# Patient Record
Sex: Male | Born: 1965 | Race: Black or African American | Hispanic: No | Marital: Single | Smoking: Former smoker
Health system: Southern US, Community
[De-identification: ages and names within clinical notes are randomized; demographics above are authoritative.]

## PROBLEM LIST (undated history)

## (undated) DIAGNOSIS — M1990 Unspecified osteoarthritis, unspecified site: Secondary | ICD-10-CM

## (undated) DIAGNOSIS — Z87442 Personal history of urinary calculi: Secondary | ICD-10-CM

## (undated) DIAGNOSIS — I10 Essential (primary) hypertension: Secondary | ICD-10-CM

## (undated) DIAGNOSIS — R51 Headache: Secondary | ICD-10-CM

## (undated) DIAGNOSIS — I499 Cardiac arrhythmia, unspecified: Secondary | ICD-10-CM

## (undated) DIAGNOSIS — N179 Acute kidney failure, unspecified: Secondary | ICD-10-CM

## (undated) DIAGNOSIS — R079 Chest pain, unspecified: Secondary | ICD-10-CM

## (undated) DIAGNOSIS — I509 Heart failure, unspecified: Secondary | ICD-10-CM

## (undated) DIAGNOSIS — R519 Headache, unspecified: Secondary | ICD-10-CM

## (undated) DIAGNOSIS — Z95810 Presence of automatic (implantable) cardiac defibrillator: Secondary | ICD-10-CM

## (undated) DIAGNOSIS — J101 Influenza due to other identified influenza virus with other respiratory manifestations: Secondary | ICD-10-CM

## (undated) DIAGNOSIS — J189 Pneumonia, unspecified organism: Secondary | ICD-10-CM

## (undated) DIAGNOSIS — I469 Cardiac arrest, cause unspecified: Secondary | ICD-10-CM

## (undated) DIAGNOSIS — I428 Other cardiomyopathies: Secondary | ICD-10-CM

## (undated) DIAGNOSIS — G459 Transient cerebral ischemic attack, unspecified: Secondary | ICD-10-CM

## (undated) DIAGNOSIS — E7800 Pure hypercholesterolemia, unspecified: Secondary | ICD-10-CM

## (undated) DIAGNOSIS — M109 Gout, unspecified: Secondary | ICD-10-CM

## (undated) HISTORY — DX: Chest pain, unspecified: R07.9

## (undated) HISTORY — PX: VASECTOMY: SHX75

## (undated) HISTORY — DX: Acute kidney failure, unspecified: N17.9

## (undated) HISTORY — DX: Pneumonia, unspecified organism: J18.9

## (undated) HISTORY — PX: EXTERNAL FIXATION LEG: SHX1549

## (undated) HISTORY — PX: UMBILICAL HERNIA REPAIR: SHX196

## (undated) HISTORY — PX: HERNIA REPAIR: SHX51

## (undated) HISTORY — DX: Influenza due to other identified influenza virus with other respiratory manifestations: J10.1

## (undated) HISTORY — DX: Transient cerebral ischemic attack, unspecified: G45.9

---

## 2000-06-28 ENCOUNTER — Ambulatory Visit (HOSPITAL_BASED_OUTPATIENT_CLINIC_OR_DEPARTMENT_OTHER): Admission: RE | Admit: 2000-06-28 | Discharge: 2000-06-29 | Payer: Self-pay | Admitting: Orthopedic Surgery

## 2001-03-12 ENCOUNTER — Ambulatory Visit (HOSPITAL_BASED_OUTPATIENT_CLINIC_OR_DEPARTMENT_OTHER): Admission: RE | Admit: 2001-03-12 | Discharge: 2001-03-12 | Payer: Self-pay | Admitting: Orthopedic Surgery

## 2001-08-08 ENCOUNTER — Ambulatory Visit (HOSPITAL_BASED_OUTPATIENT_CLINIC_OR_DEPARTMENT_OTHER): Admission: RE | Admit: 2001-08-08 | Discharge: 2001-08-09 | Payer: Self-pay | Admitting: Orthopedic Surgery

## 2007-09-08 ENCOUNTER — Emergency Department: Payer: Self-pay | Admitting: Emergency Medicine

## 2008-01-30 ENCOUNTER — Emergency Department: Payer: Self-pay | Admitting: Emergency Medicine

## 2008-01-30 ENCOUNTER — Other Ambulatory Visit: Payer: Self-pay

## 2008-02-27 ENCOUNTER — Ambulatory Visit: Payer: Self-pay | Admitting: Family Medicine

## 2008-03-11 ENCOUNTER — Ambulatory Visit: Payer: Self-pay | Admitting: Gastroenterology

## 2008-06-21 ENCOUNTER — Emergency Department: Payer: Self-pay | Admitting: Internal Medicine

## 2008-09-05 ENCOUNTER — Other Ambulatory Visit: Payer: Self-pay

## 2008-09-05 ENCOUNTER — Emergency Department: Payer: Self-pay | Admitting: Emergency Medicine

## 2009-04-11 ENCOUNTER — Ambulatory Visit: Payer: Self-pay | Admitting: Internal Medicine

## 2009-04-12 ENCOUNTER — Emergency Department: Payer: Self-pay | Admitting: Emergency Medicine

## 2009-08-04 ENCOUNTER — Ambulatory Visit: Payer: Self-pay | Admitting: Specialist

## 2009-08-12 ENCOUNTER — Ambulatory Visit: Payer: Self-pay | Admitting: Specialist

## 2009-08-29 ENCOUNTER — Ambulatory Visit: Payer: Self-pay | Admitting: Specialist

## 2009-09-09 ENCOUNTER — Ambulatory Visit: Payer: Self-pay | Admitting: Specialist

## 2015-11-19 ENCOUNTER — Emergency Department: Payer: Managed Care, Other (non HMO)

## 2015-11-19 ENCOUNTER — Encounter: Payer: Self-pay | Admitting: Emergency Medicine

## 2015-11-19 ENCOUNTER — Emergency Department
Admission: EM | Admit: 2015-11-19 | Discharge: 2015-11-19 | Disposition: A | Discharge status: Home / Self Care | Payer: Managed Care, Other (non HMO) | Attending: Emergency Medicine | Admitting: Emergency Medicine

## 2015-11-19 DIAGNOSIS — R0789 Other chest pain: Secondary | ICD-10-CM | POA: Insufficient documentation

## 2015-11-19 DIAGNOSIS — I10 Essential (primary) hypertension: Secondary | ICD-10-CM | POA: Diagnosis present

## 2015-11-19 DIAGNOSIS — I509 Heart failure, unspecified: Secondary | ICD-10-CM | POA: Insufficient documentation

## 2015-11-19 DIAGNOSIS — I160 Hypertensive urgency: Secondary | ICD-10-CM | POA: Insufficient documentation

## 2015-11-19 DIAGNOSIS — R0781 Pleurodynia: Secondary | ICD-10-CM

## 2015-11-19 DIAGNOSIS — F17200 Nicotine dependence, unspecified, uncomplicated: Secondary | ICD-10-CM | POA: Diagnosis not present

## 2015-11-19 HISTORY — DX: Essential (primary) hypertension: I10

## 2015-11-19 LAB — CBC WITH DIFFERENTIAL/PLATELET
BASOS PCT: 1 %
Basophils Absolute: 0.1 10*3/uL (ref 0–0.1)
EOS ABS: 0.2 10*3/uL (ref 0–0.7)
EOS PCT: 3 %
HEMATOCRIT: 50.6 % (ref 40.0–52.0)
Hemoglobin: 16.3 g/dL (ref 13.0–18.0)
Lymphocytes Relative: 26 %
Lymphs Abs: 2.5 10*3/uL (ref 1.0–3.6)
MCH: 28.1 pg (ref 26.0–34.0)
MCHC: 32.2 g/dL (ref 32.0–36.0)
MCV: 87.3 fL (ref 80.0–100.0)
MONO ABS: 0.9 10*3/uL (ref 0.2–1.0)
MONOS PCT: 9 %
Neutro Abs: 5.9 10*3/uL (ref 1.4–6.5)
Neutrophils Relative %: 61 %
PLATELETS: 215 10*3/uL (ref 150–440)
RBC: 5.8 MIL/uL (ref 4.40–5.90)
RDW: 15 % — AB (ref 11.5–14.5)
WBC: 9.6 10*3/uL (ref 3.8–10.6)

## 2015-11-19 LAB — TROPONIN I
TROPONIN I: 0.05 ng/mL — AB (ref <0.031)
Troponin I: 0.04 ng/mL — ABNORMAL HIGH (ref <0.031)

## 2015-11-19 LAB — COMPREHENSIVE METABOLIC PANEL
ALBUMIN: 3.9 g/dL (ref 3.5–5.0)
ALT: 21 U/L (ref 17–63)
ANION GAP: 6 (ref 5–15)
AST: 21 U/L (ref 15–41)
Alkaline Phosphatase: 60 U/L (ref 38–126)
BILIRUBIN TOTAL: 0.8 mg/dL (ref 0.3–1.2)
BUN: 19 mg/dL (ref 6–20)
CO2: 25 mmol/L (ref 22–32)
Calcium: 9.6 mg/dL (ref 8.9–10.3)
Chloride: 109 mmol/L (ref 101–111)
Creatinine, Ser: 1.06 mg/dL (ref 0.61–1.24)
GFR calc Af Amer: >60 mL/min (ref >60)
GFR calc non Af Amer: >60 mL/min (ref >60)
GLUCOSE: 113 mg/dL — AB (ref 65–99)
POTASSIUM: 3.8 mmol/L (ref 3.5–5.1)
SODIUM: 140 mmol/L (ref 135–145)
TOTAL PROTEIN: 7.1 g/dL (ref 6.5–8.1)

## 2015-11-19 LAB — BRAIN NATRIURETIC PEPTIDE: B NATRIURETIC PEPTIDE 5: 425 pg/mL — AB (ref 0.0–100.0)

## 2015-11-19 LAB — LIPASE, BLOOD: Lipase: 28 U/L (ref 11–51)

## 2015-11-19 MED ORDER — FUROSEMIDE 10 MG/ML IJ SOLN
20.0000 mg | Freq: Once | INTRAMUSCULAR | Status: AC
Start: 2015-11-19 — End: 2015-11-19
  Administered 2015-11-19: 20 mg via INTRAVENOUS
  Filled 2015-11-19: qty 4

## 2015-11-19 MED ORDER — LABETALOL HCL 5 MG/ML IV SOLN
10.0000 mg | Freq: Once | INTRAVENOUS | Status: AC
  Administered 2015-11-19: 10 mg via INTRAVENOUS
  Filled 2015-11-19: qty 4

## 2015-11-19 MED ORDER — LISINOPRIL-HYDROCHLOROTHIAZIDE 10-12.5 MG PO TABS: 1.0000 | ORAL_TABLET | Freq: Every day | ORAL | Status: DC

## 2015-11-19 MED ORDER — LABETALOL HCL 5 MG/ML IV SOLN
INTRAVENOUS | Status: AC
  Administered 2015-11-19: 20 mg via INTRAVENOUS
  Filled 2015-11-19: qty 4

## 2015-11-19 MED ORDER — FUROSEMIDE 20 MG PO TABS: 20.0000 mg | ORAL_TABLET | Freq: Every day | ORAL | Status: DC

## 2015-11-19 MED ORDER — HYDROCHLOROTHIAZIDE 12.5 MG PO CAPS
12.5000 mg | ORAL_CAPSULE | Freq: Every day | ORAL | Status: DC
  Administered 2015-11-19: 12.5 mg via ORAL
  Filled 2015-11-19: qty 1

## 2015-11-19 MED ORDER — LISINOPRIL 20 MG PO TABS
10.0000 mg | ORAL_TABLET | Freq: Once | ORAL | Status: AC
  Administered 2015-11-19: 10 mg via ORAL
  Filled 2015-11-19: qty 1

## 2015-11-19 MED ORDER — LABETALOL HCL 5 MG/ML IV SOLN
20.0000 mg | Freq: Once | INTRAVENOUS | Status: AC
  Administered 2015-11-19: 20 mg via INTRAVENOUS

## 2015-11-19 MED ORDER — IOHEXOL 350 MG/ML SOLN
75.0000 mL | Freq: Once | INTRAVENOUS | Status: AC | PRN
  Administered 2015-11-19: 75 mL via INTRAVENOUS

## 2015-11-19 MED ORDER — ASPIRIN 81 MG PO CHEW
324.0000 mg | CHEWABLE_TABLET | Freq: Once | ORAL | Status: AC
  Administered 2015-11-19: 324 mg via ORAL
  Filled 2015-11-19: qty 4

## 2015-11-19 NOTE — ED Notes (Signed)
Discussed discharge instructions, prescriptions, and follow-up care with patient. No questions or concerns at this time. Pt stable at discharge.  

## 2015-11-19 NOTE — Discharge Instructions (Signed)
Please follow [name] on Monday. This is extremely important, please let the clinic know you were seen in the ER and [name] wishes to see you this Monday in follow-up in his office.  Return to emergency room right away if you have increasing shortness of breath, return of chest pain, feel weak or dizzy, or if any other new concerns or symptoms arise.

## 2015-11-19 NOTE — ED Notes (Signed)
Pt to ed from [facility].  Pt states he has been feeling sob and having upper abd pain.

## 2015-11-19 NOTE — ED Provider Notes (Signed)
[facility] Emergency Department Provider Note REMINDER - THIS NOTE IS NOT A FINAL MEDICAL RECORD UNTIL IT IS SIGNED. UNTIL THEN, THE CONTENT BELOW MAY REFLECT INFORMATION FROM A DOCUMENTATION TEMPLATE, NOT THE ACTUAL PATIENT VISIT. ____________________________________________  Time seen: Approximately 2:11 PM  I have reviewed the triage vital signs and the nursing notes.   HISTORY  Chief Complaint Hypertension    HPI Stephen [name] is a 49 y.o. male history of hypertension.  The patient reports he comes for evaluation of feeling short of breathand having frequent cough over the last month. He reports he was seen in urgent care a month ago for the same but was having "pleurisy" with rib pain along the right ribs at that time. He denies any chest pain or discomfort now but reports a persistent nonproductive dry cough for the last month. No fevers or chills. No chest pain presently that he did have right-sided chest pain which resolved a couple of weeks ago over the right lower ribs.  He does report that he is supposed to be taking blood pressure medication but is not [place] for quite some time, in addition he is a smoker.  He does have a history of travel frequent traveling back and forth from [place] for work. He denies any leg swelling, his last trip was approximate 6 months ago.     Past Medical History  Diagnosis Date  . Hypertension     There are no active problems to display for this patient.   History reviewed. No pertinent past surgical history.  Current Outpatient Rx  Name  Route  Sig  Dispense  Refill  . furosemide (LASIX) 20 MG tablet   Oral   Take 1 tablet (20 mg total) by mouth daily.   10 tablet   0   . lisinopril-hydrochlorothiazide (ZESTORETIC) 10-12.5 MG tablet   Oral   Take 1 tablet by mouth daily.   30 tablet   0     Allergies Review of patient's allergies indicates no known allergies.  History reviewed. No  pertinent family history.  Social History Social History  Substance Use Topics  . Smoking status: Current Every Day Smoker  . Smokeless tobacco: None  . Alcohol Use: Yes    Review of Systems Constitutional: No fever/chills Eyes: No visual changes. ENT: No sore throat. Cardiovascular: Denies chest pain. Respiratory: See hpi, mild SOB, worse at night Gastrointestinal: No abdominal pain.  No nausea, no vomiting.  No diarrhea.  No constipation. Genitourinary: Negative for dysuria. Musculoskeletal: Negative for back pain. Skin: Negative for rash. Neurological: Negative for headaches, focal weakness or numbness.  10-point ROS otherwise negative.  ____________________________________________   PHYSICAL EXAM:  VITAL SIGNS: ED Triage Vitals  Enc Vitals Group     BP 11/19/15 1103 114/85 mmHg     Pulse Rate 11/19/15 1103 88     Resp 11/19/15 1103 18     Temp 11/19/15 1103 98.6 F (37 C)     Temp Source 11/19/15 1103 Oral     SpO2 11/19/15 1103 100 %     Weight 11/19/15 1103 245 lb (111.131 kg)     Height 11/19/15 1103 6\' 1"  (1.854 m)     Head Cir --      Peak Flow --      Pain Score 11/19/15 1050 5     Pain Loc --      Pain Edu? --      Excl. in [place]? --    Constitutional:  Alert and oriented. Well appearing and in no acute distress. Eyes: Conjunctivae are normal. PERRL. EOMI. Head: Atraumatic. Nose: No congestion/rhinnorhea. Mouth/Throat: Mucous membranes are moist.  Oropharynx non-erythematous. Neck: No stridor.   Cardiovascular: Normal rate, regular rhythm. Grossly normal heart sounds.  Good peripheral circulation. Respiratory: Normal respiratory effort.  No retractions. Lungs CTAB. Speaks in full and clear sentences without evidence of dyspnea or distress. Gastrointestinal: Soft and nontender. No distention. No abdominal bruits. No CVA tenderness. Musculoskeletal: No lower extremity tenderness nor edema.  No joint effusions. Neurologic:  Normal speech and language. No  gross focal neurologic deficits are appreciated. Skin:  Skin is warm, dry and intact. No rash noted. Psychiatric: Mood and affect are normal. Speech and behavior are normal.  ____________________________________________   LABS (all labs ordered are listed, but only abnormal results are displayed)  Labs Reviewed  CBC WITH DIFFERENTIAL/PLATELET - Abnormal; Notable for the following:    RDW 15.0 (*)    All other components within normal limits  COMPREHENSIVE METABOLIC PANEL - Abnormal; Notable for the following:    Glucose, Bld 113 (*)    All other components within normal limits  TROPONIN I - Abnormal; Notable for the following:    Troponin I 0.04 (*)    All other components within normal limits  BRAIN NATRIURETIC PEPTIDE - Abnormal; Notable for the following:    B Natriuretic Peptide 425.0 (*)    All other components within normal limits  LIPASE, BLOOD  TROPONIN I   ____________________________________________  EKG  EKG is reviewed and interpreted by me at 11 AM, also discussed with [name] of cardiology EKG reviews shows ventricular rate 90 Normal sinus rhythm Evidence of incomplete right bundle-branch block as well as left ventricular hypertrophy noted  There is nonspecific T-wave abnormality seen in lateral precordial leads which likely correlates with LVH and is less likely to represent acute ischemia especially in the setting of patient not have any chest pain Left axis deviation QTc 460 QRS 106 PR 198  ____________________________________________  RADIOLOGY  CT Angio Chest PE W/Cm &/Or Wo Cm (Final result) Result time: 11/19/15 14:41:56   Final result by Rad Results In Interface (11/19/15 14:41:56)   Narrative:   CLINICAL DATA: Chest pain and shortness of breath.  EXAM: CT ANGIOGRAPHY CHEST WITH CONTRAST  TECHNIQUE: Multidetector CT imaging of the chest was performed using the standard protocol during bolus administration of intravenous contrast.  Multiplanar CT image reconstructions and MIPs were obtained to evaluate the vascular anatomy.  CONTRAST: 39mL OMNIPAQUE IOHEXOL 350 MG/ML SOLN  COMPARISON: None.  FINDINGS: Mediastinum: Mild cardiac enlargement. There is no pericardial effusion. The trachea appears patent and is midline. The esophagus is unremarkable. The main pulmonary artery is patent. No lobar or segmental pulmonary artery filling defects identified to suggest a clinically significant acute pulmonary embolus.  Lungs/Pleura: There is diffuse interstitial edema. Patchy areas of ground-glass attenuation noted in both lower lobes likely related to edema. Small right pleural effusion identified.  Upper Abdomen: Normal appearance of the liver. The visualized portions of the spleen are normal. The adrenal glands are unremarkable.  Musculoskeletal: Mild spondylosis identified within the thoracic spine. No aggressive lytic or sclerotic bone lesions.  Review of the MIP images confirms the above findings.  IMPRESSION: 1. No evidence for acute pulmonary embolus. 2. Moderate interstitial edema.   Electronically Signed By: Kerby Moors M.D. On: 11/19/2015 14:41          DG Chest 2 View (Final result) Result time: 11/19/15 11:39:29  Final result by Rad Results In Interface (11/19/15 11:39:29)   Narrative:   CLINICAL DATA: Shortness of breath for 2 months. Hypertension. Smoker  EXAM: CHEST 2 VIEW  COMPARISON: None.  FINDINGS: Mild cardiac enlargement. Mild diffuse interstitial edema. No airspace consolidation. No pleural effusions.  IMPRESSION: 1. Cardiac enlargement and pulmonary edema   Electronically Signed By: Kerby Moors M.D. On: 11/19/2015 11:39    ____________________________________________   PROCEDURES  Procedure(s) performed: None  Critical Care performed: No  ____________________________________________   INITIAL IMPRESSION / ASSESSMENT AND PLAN / ED  COURSE  Pertinent labs & imaging results that were available during my care of the patient were reviewed by me and considered in my medical decision making (see chart for details).  Patient lives for evaluation of mild cough and shortness of breath which is been slowly worsening and persistent over the last month. Initially diagnosis pleurisy while he was having some sharp right-sided chest pain about a month ago. Given pleuritic chest pain and now having some dyspnea with noted EKG, diagnosis of pulmonary embolism cannot be excluded. Perform CT to evaluate for PE in addition with the patient's significant hyper tension and severe diastolic hypertension congestive heart failure is also considered. Overall the patient is in no acute distress, but he is symptomatic with some slight dyspnea.  Patient's first troponin is slightly elevated 0.04 without chest pain and having had symptomatology for a month or suspect is likely due to some demand-type ischemia, versus strain due to significant hypertension and unlikely represents an acute coronary syndrome. BNP is also elevated for 25, patient carries no history of congestive heart failure and CT reveals interstitial edema. All of these appear to point towards congestive heart failure likely secondary to the patient's severe untreated hypertension.  Discussed with [name] of cardiology who advises aggressive treatment of the patient's blood pressure including giving Lasix and labetalol IV in the emergency room along with by mouth medications as a written for. He recommends discharging the patient on lisinopril 10 mg/HCTZ as well as 20 mg Lasix daily for follow-up with cardiology on Monday if his blood pressures have improved and his diastolic is less than [id number] after by mouth and IV medication to advise admission to the hospital for hypertensive urgency. This plan of care and careful return precautions are discussed with the patient is quite agreeable. He agrees  to follow-up with cardiology Monday and will be compliant with medications as written. Return to ER if he develops severe increasing shortness of breath or chest discomfort.  We will also check a second troponin and if not rising feel comfortable discharging the patient home with blood pressures controlled. Plan of care signed out to Dr. Marcelene Butte will follow-up on second troponin and blood pressure management. ____________________________________________   FINAL CLINICAL IMPRESSION(S) / ED DIAGNOSES  Final diagnoses:  Pleuritic pain  Congenital heart failure ([place])  Hypertensive urgency      Delman Kitten, MD 11/19/15 1550

## 2015-11-19 NOTE — ED Provider Notes (Signed)
-----------------------------------------   5:09 PM on 11/19/2015 -----------------------------------------   Blood pressure 150/119, pulse 82, temperature 98.6 F (37 C), temperature source Oral, resp. rate 16, height 6\' 1"  (1.854 m), weight 245 lb (111.131 kg), SpO2 94 %.  Assuming care from Dr. Jacqualine Code.  In short, Stephen [name] is a 49 y.o. male with a chief complaint of Hypertension .  Refer to the original H&P for additional details.  The current plan of care is to follow the patient's troponin which came back is 0.05. We felt this was not a significant change from the patient's earlier troponin and continue with further outpatient management. Patient received initial labetalol 10 mg IV and then was followed with 20 mg IV were able to establish diastolic pressures of less than 100. Already previously arranged outpatient follow-up with cardiology Dr. Marcelline Deist. Patient will be discharged on medication for hypertension including lisinopril/hydrochlorothiazide and Lasix. Patient's been advised to continue drinking plenty of fluids and return here if he has any chest pain, increased shortness of breath, or any other new concerns.   Daymon Larsen, MD 11/19/15 [phone]

## 2015-12-28 ENCOUNTER — Ambulatory Visit
Admission: EM | Admit: 2015-12-28 | Discharge: 2015-12-28 | Disposition: A | Discharge status: Home / Self Care | Payer: Managed Care, Other (non HMO) | Attending: Family Medicine | Admitting: Family Medicine

## 2015-12-28 ENCOUNTER — Ambulatory Visit (INDEPENDENT_AMBULATORY_CARE_PROVIDER_SITE_OTHER): Payer: Managed Care, Other (non HMO)

## 2015-12-28 ENCOUNTER — Encounter: Payer: Self-pay | Admitting: Emergency Medicine

## 2015-12-28 DIAGNOSIS — I10 Essential (primary) hypertension: Secondary | ICD-10-CM | POA: Diagnosis not present

## 2015-12-28 DIAGNOSIS — J40 Bronchitis, not specified as acute or chronic: Secondary | ICD-10-CM | POA: Diagnosis not present

## 2015-12-28 DIAGNOSIS — J0100 Acute maxillary sinusitis, unspecified: Secondary | ICD-10-CM

## 2015-12-28 MED ORDER — AMOXICILLIN-POT CLAVULANATE 875-125 MG PO TABS: 1.0000 | ORAL_TABLET | Freq: Two times a day (BID) | ORAL | Status: DC

## 2015-12-28 MED ORDER — CLONIDINE HCL 0.1 MG PO TABS
0.1000 mg | ORAL_TABLET | Freq: Once | ORAL | Status: AC
  Administered 2015-12-28: 0.1 mg via ORAL

## 2015-12-28 MED ORDER — LISINOPRIL-HYDROCHLOROTHIAZIDE 20-12.5 MG PO TABS: 1.0000 | ORAL_TABLET | Freq: Every day | ORAL | Status: DC

## 2015-12-28 MED ORDER — BENZONATATE 100 MG PO CAPS: 100.0000 mg | ORAL_CAPSULE | Freq: Three times a day (TID) | ORAL | Status: DC | PRN

## 2015-12-28 MED ORDER — PREDNISONE 20 MG PO TABS: 40.0000 mg | ORAL_TABLET | Freq: Every day | ORAL | Status: AC

## 2015-12-28 MED ORDER — ALBUTEROL SULFATE HFA 108 (90 BASE) MCG/ACT IN AERS: 2.0000 | INHALATION_SPRAY | RESPIRATORY_TRACT | Status: DC | PRN

## 2015-12-28 NOTE — ED Provider Notes (Signed)
[facility]  ____________________________________________  Time seen: Approximately [id number] PM  I have reviewed the triage vital signs and the nursing notes.   HISTORY  Chief Complaint Cough  HPI Stephen [name] is a 49 y.o. male presents for the complaints of 3-4 weeks of runny nose, nasal congestion, nasal drainage, cough. States cough is mostly dry cough but with intermittent production of whitish phlegm. Patient states the cough is worse at night and feel drainage in back of throat. Reports thick greenish nasal drainage. Patient reports that occasionally he can hear himself wheezing with cough. Denies chest pain or shortness of breath. Patient reports he has been taking some combination cough cold and congestion medicines over-the-counter.   Denies current pain. Reports continues to eat and drink well. Denies fevers at home. Denies leg swelling or other swelling. Denies weakness, dizziness, headache, vision changes, chest pain, shortness of breath, nausea, vomiting or diarrhea.  Of note, patient reports that he is currently following with a cardiologist due to pulmonary edema that was found in the ER in November. Patient reports that he was last seen by the cardiologist yesterday for stress test and has another appt with cardiologist this coming Friday for echocardiogram. Patient does also report of note that the cough that the cough was present prior to starting his lisinopril. Patient reports that the lisinopril started when he was in the ER visit.  Patient also reports that when he is a cardiology office yesterday and his blood pressure was what he described as slightly elevated but states the cardiologist did not want to make any medication management changes at that time.    Past Medical History  Diagnosis Date  . Hypertension     There are no active problems to display for this patient.   Past Surgical History  Procedure Laterality Date  . Knee surgery Right      Current Outpatient Rx  Name  Route  Sig  Dispense  Refill  . furosemide (LASIX) 20 MG tablet   Oral   Take 1 tablet (20 mg total) by mouth daily. Patient taking differently: Take 20 mg by mouth as needed for edema.    10 tablet   0   . lisinopril-hydrochlorothiazide (ZESTORETIC) 10-12.5 MG tablet   Oral   Take 1 tablet by mouth daily.   30 tablet   0     Allergies Review of patient's allergies indicates no known allergies.   family history Father: HTN, DM, MI  Deceased at age 28 post MI.  Social History Social History  Substance Use Topics  . Smoking status: Current Every Day Smoker    Types: Cigarettes  . Smokeless tobacco: None  . Alcohol Use: Yes    Review of Systems Constitutional: No fever/chills Eyes: No visual changes. ENT: Positive runny nose, nasal congestion, nasal drainage, intermittent cough and intermittent sore throat. Cardiovascular: Denies chest pain. Respiratory: Denies shortness of breath. Gastrointestinal: No abdominal pain.  No nausea, no vomiting.  No diarrhea.  No constipation. Genitourinary: Negative for dysuria. Musculoskeletal: Negative for back pain. Skin: Negative for rash. Neurological: Negative for headaches, focal weakness or numbness.  10-point ROS otherwise negative.  ____________________________________________   PHYSICAL EXAM:  VITAL SIGNS: ED Triage Vitals  Enc Vitals Group     BP 12/28/15 1430 167/118 mmHg     Pulse Rate 12/28/15 1430 94     Resp 12/28/15 1430 20     Temp 12/28/15 1430 98.1 F (36.7 C)     Temp Source 12/28/15  1430 Oral     SpO2 12/28/15 1430 97 %     Weight 12/28/15 1430 255 lb (115.667 kg)     Height 12/28/15 1430 6\' 2"  (1.88 m)     Head Cir --      Peak Flow --      Pain Score --      Pain Loc --      Pain Edu? --      Excl. in [place]   12/28/15 1430 12/28/15 1649 12/28/15 1737 12/28/15 1828  BP: 167/118 177/112 170/121 164/114  Pulse: 94 85 88 74  Temp: 98.1 F  (36.7 C) 97.8 F (36.6 C)    TempSrc: Oral Tympanic    Resp: 20 16    Height: 6\' 2"  (1.88 m)     Weight: 255 lb (115.667 kg)     SpO2: 97% 98%       Constitutional: Alert and oriented. Well appearing and in no acute distress. Ambulatory in room.  Eyes: Conjunctivae are normal. PERRL. EOMI. Head: Atraumatic. Mild to moderate tenderness palpated bilateral frontal and maxillary sinuses. No swelling. No erythema.  Ears: no erythema, normal TMs bilaterally.   Nose: Nasal congestion bilateral nasal turbinate erythema and edema.  Mouth/Throat: Mucous membranes are moist.  Oropharynx non-erythematous. No tonsillar swelling or exudate. Neck: No stridor.  No cervical spine tenderness to palpation. Hematological/Lymphatic/Immunilogical: No cervical lymphadenopathy. Cardiovascular: Normal rate, regular rhythm. Grossly normal heart sounds.  Good peripheral circulation. Respiratory: Normal respiratory effort.  No retractions. Some scattered rhonchi. No rales or wheezing. Good air movement. Speaking complete sentences. Dry intermittent cough in room.  Gastrointestinal: Soft and nontender.  Normal Bowel sounds. Musculoskeletal: No lower or upper extremity tenderness nor edema. Bilateral pedal pulses equal and easily palpated. No cervical, thoracic or lumbar tenderness to palpation. No calf tenderness bilaterally.  Neurologic:  Normal speech and language. No gross focal neurologic deficits are appreciated. No gait instability. Skin:  Skin is warm, dry and intact. No rash noted. Psychiatric: Mood and affect are normal. Speech and behavior are normal.  ____________________________________________   LABS (all labs ordered are listed, but only abnormal results are displayed)  Labs Reviewed - No data to display  RADIOLOGY  EXAM: CHEST 2 VIEW  COMPARISON: Chest x-ray dated 11/19/2015 and chest CT dated 11/19/2015  FINDINGS: There is chronic cardiomegaly with tortuosity of the thoracic  aorta. The interstitial edema present on the prior exam has resolved. No effusions. The azygos vein is no longer distended. The patient does have peribronchial thickening which could represent bronchitis or residual slight peribronchial pulmonary edema.  IMPRESSION: Resolution of interstitial pulmonary edema with residual peribronchial thickening which could represent bronchitis or residual peribronchial pulmonary edema.  Chronic cardiomegaly.   Electronically Signed By: Lorriane Shire M.D. On: 12/28/2015 15:55      I, Marylene Land, personally viewed and evaluated these images (plain radiographs) as part of my medical decision making, as well as reviewing the written report by the radiologist.   [place] / [place] / ED COURSE  Pertinent labs & imaging results that were available during my care of the patient were reviewed by me and considered in my medical decision making (see chart for details).  Discussed patient and planning care with Dr. Zenda Alpers who agreed to plan.   Very well-appearing patient. No acute distress. Presents for the complaints of runny nose, nasal congestion, intermittent cough. Patient does also report intermittent wheezing at home. Denies pain at this  time. Denies chest pain, shortness of breath, vision changes, dizziness, weakness, extremity swelling or other complaints. Patient is hypertensive at urgent care visit, patient also reports that he has been taking over-the-counter cough cold congestion agents. Reports took home BP med this am. Counseled regarding avoidance of use of multi symptom treatments as could elevated blood pressure. Suspect bronchitis and sinusitis. However as patient was also recently in the emergency room in November for pulmonary edema will evaluate chest x-ray.  Chest x-ray reviewed. Chest x-ray per radiologist resolution of interstitial pulmonary edema with residual peribronchial thickening which could  represent bronchitis or residual peribronchial pulmonary edema, chronic cardiomegaly.  Patient also has a follow-up with his cardiologist again this coming Friday.  Suspect sinusitis and bronchitis. Will treat patient with oral Augmentin, Tessalon Perles when necessary, 3 day course of low-dose prednisone, pro-air inhaler as needed. Again counseled regarding limiting use of over-the-counter medications. Regarding counseling monitoring blood pressure at home and keeping journal. Follow-up with cardiology this Friday as scheduled.  1650: Blood pressure 177/112, aymptomatic and continues to deny complaints of chest pain, shortness of breath, dizziness, weakness, vision changes, headache, abdominal pain or other complaints. Clonidine 0.1 mg once. Will continue to monitor.   1820: Very well-appearing patient. No acute distress. Patient resting in room. Spouse at bedside. Patient blood pressure manually rechecked 164/114. Asymptomatic of elevated blood pressure. As patient on taking lisinopril 10 mg and HCTZ 12.5 mg and only has 2 pills left, will increase these dosages to lisinopril 20 mg HCTZ 12.5 mg. Counseled regarding monitoring blood pressure at home daily and closely. Patient to keep journal at home. Patient to walk daily. Counseled extensively regarding smoking cessation. Patient and spouse verbalized understanding and agreed to this plan. Counseled regarding normal range blood pressures.   Discussed follow up with Primary care physician this week. Discussed follow up and return parameters to urgent care or ER including chest pain, shortness of breath, dizziness, headache, weakness, extremity swelling, no resolution or any worsening concerns. Patient verbalized understanding and agreed to plan.   ____________________________________________   FINAL CLINICAL IMPRESSION(S) / ED DIAGNOSES  Final diagnoses:  Acute maxillary sinusitis, recurrence not specified  Bronchitis  hypertension        Marylene Land, NP 12/28/15 1902

## 2015-12-28 NOTE — ED Notes (Signed)
Pt reports cough for about a month, trying to get over it. Denies fever or chills.

## 2015-12-28 NOTE — ED Notes (Signed)
Pt also having nasal drainage and can feel phlegm in throat.

## 2015-12-28 NOTE — Discharge Instructions (Signed)
Take  medication as prescribed. Take all medication also as prescribed. Rest. Monitor diet. Avoid taking multiple over-the-counter medications.  Monitor blood pressure closely at home and keep a journal. Follow-up closely with cardiology as scheduled this coming Friday. Also follow-up closely with your primary care physician.  Return to urgent care proceed to the ER for chest pain, shortness breath, dizziness, vision changes, new or worsening concerns.   Sinusitis, Adult Sinusitis is redness, soreness, and puffiness (inflammation) of the air pockets in the bones of your face (sinuses). The redness, soreness, and puffiness can cause air and mucus to get trapped in your sinuses. This can allow germs to grow and cause an infection.  HOME CARE  1. Drink enough fluids to keep your pee (urine) clear or pale yellow. 2. Use a humidifier in your home. 3. Run a hot shower to create steam in the bathroom. Sit in the bathroom with the door closed. Breathe in the steam 3-4 times a day. 4. Put a warm, moist washcloth on your face 3-4 times a day, or as told by your doctor. 5. Use salt water sprays (saline sprays) to wet the thick fluid in your nose. This can help the sinuses drain. 6. Only take medicine as told by your doctor. GET HELP RIGHT AWAY IF:  1. Your pain gets worse. 2. You have very bad headaches. 3. You are sick to your stomach (nauseous). 4. You throw up (vomit). 5. You are very sleepy (drowsy) all the time. 6. Your face is puffy (swollen). 7. Your vision changes. 8. You have a stiff neck. 9. You have trouble breathing. MAKE SURE YOU:   Understand these instructions.  Will watch your condition.  Will get help right away if you are not doing well or get worse.   This information is not intended to replace advice given to you by your health care provider. Make sure you discuss any questions you have with your health care provider.   Document Released: 06/04/2008 Document Revised:  01/07/2015 Document Reviewed: 07/22/2012 Elsevier Interactive Patient Education 2016 [employer].  How to Use an Inhaler Proper inhaler technique is very important. Good technique ensures that the medicine reaches the lungs. Poor technique results in depositing the medicine on the tongue and back of the throat rather than in the airways. If you do not use the inhaler with good technique, the medicine will not help you. STEPS TO FOLLOW IF USING AN INHALER WITHOUT AN EXTENSION TUBE 7. Remove the cap from the inhaler. 8. If you are using the inhaler for the first time, you will need to prime it. Shake the inhaler for 5 seconds and release four puffs into the air, away from your face. Ask your health care provider or pharmacist if you have questions about priming your inhaler. 9. Shake the inhaler for 5 seconds before each breath in (inhalation). 10. Position the inhaler so that the top of the canister faces up. 11. Put your index finger on the top of the medicine canister. Your thumb supports the bottom of the inhaler. 12. Open your mouth. 13. Either place the inhaler between your teeth and place your lips tightly around the mouthpiece, or hold the inhaler 1-2 inches away from your open mouth. If you are unsure of which technique to use, ask your health care provider. 14. Breathe out (exhale) normally and as completely as possible. 15. Press the canister down with your index finger to release the medicine. 16. At the same time as the canister is  pressed, inhale deeply and slowly until your lungs are completely filled. This should take 4-6 seconds. Keep your tongue down. 17. Hold the medicine in your lungs for 5-10 seconds (10 seconds is best). This helps the medicine get into the small airways of your lungs. 18. Breathe out slowly, through pursed lips. Whistling is an example of pursed lips. 19. Wait at least 15-30 seconds between puffs. Continue with the above steps until you have taken the number  of puffs your health care provider has ordered. Do not use the inhaler more than your health care provider tells you. 20. Replace the cap on the inhaler. 21. Follow the directions from your health care provider or the inhaler insert for cleaning the inhaler. STEPS TO FOLLOW IF USING AN INHALER WITH AN EXTENSION (SPACER) 10. Remove the cap from the inhaler. 11. If you are using the inhaler for the first time, you will need to prime it. Shake the inhaler for 5 seconds and release four puffs into the air, away from your face. Ask your health care provider or pharmacist if you have questions about priming your inhaler. 12. Shake the inhaler for 5 seconds before each breath in (inhalation). 13. Place the open end of the spacer onto the mouthpiece of the inhaler. 14. Position the inhaler so that the top of the canister faces up and the spacer mouthpiece faces you. 15. Put your index finger on the top of the medicine canister. Your thumb supports the bottom of the inhaler and the spacer. 16. Breathe out (exhale) normally and as completely as possible. 17. Immediately after exhaling, place the spacer between your teeth and into your mouth. Close your lips tightly around the spacer. 18. Press the canister down with your index finger to release the medicine. 19. At the same time as the canister is pressed, inhale deeply and slowly until your lungs are completely filled. This should take 4-6 seconds. Keep your tongue down and out of the way. 20. Hold the medicine in your lungs for 5-10 seconds (10 seconds is best). This helps the medicine get into the small airways of your lungs. Exhale. 21. Repeat inhaling deeply through the spacer mouthpiece. Again hold that breath for up to 10 seconds (10 seconds is best). Exhale slowly. If it is difficult to take this second deep breath through the spacer, breathe normally several times through the spacer. Remove the spacer from your mouth. 22. Wait at least 15-30 seconds  between puffs. Continue with the above steps until you have taken the number of puffs your health care provider has ordered. Do not use the inhaler more than your health care provider tells you. 23. Remove the spacer from the inhaler, and place the cap on the inhaler. 24. Follow the directions from your health care provider or the inhaler insert for cleaning the inhaler and spacer. If you are using different kinds of inhalers, use your quick relief medicine to open the airways 10-15 minutes before using a steroid if instructed to do so by your health care provider. If you are unsure which inhalers to use and the order of using them, ask your health care provider, nurse, or respiratory therapist. If you are using a steroid inhaler, always rinse your mouth with water after your last puff, then gargle and spit out the water. Do not swallow the water. AVOID:  Inhaling before or after starting the spray of medicine. It takes practice to coordinate your breathing with triggering the spray.  Inhaling through the nose (  rather than the mouth) when triggering the spray. HOW TO DETERMINE IF YOUR INHALER IS FULL OR NEARLY EMPTY You cannot know when an inhaler is empty by shaking it. A few inhalers are now being made with dose counters. Ask your health care provider for a prescription that has a dose counter if you feel you need that extra help. If your inhaler does not have a counter, ask your health care provider to help you determine the date you need to refill your inhaler. Write the refill date on a calendar or your inhaler canister. Refill your inhaler 7-10 days before it runs out. Be sure to keep an adequate supply of medicine. This includes making sure it is not expired, and that you have a spare inhaler.  SEEK MEDICAL CARE IF:   Your symptoms are only partially relieved with your inhaler.  You are having trouble using your inhaler.  You have some increase in phlegm. SEEK IMMEDIATE MEDICAL CARE IF:    You feel little or no relief with your inhalers. You are still wheezing and are feeling shortness of breath or tightness in your chest or both.  You have dizziness, headaches, or a fast heart rate.  You have chills, fever, or night sweats.  You have a noticeable increase in phlegm production, or there is blood in the phlegm. MAKE SURE YOU:   Understand these instructions.  Will watch your condition.  Will get help right away if you are not doing well or get worse.   This information is not intended to replace advice given to you by your health care provider. Make sure you discuss any questions you have with your health care provider.   Document Released: 12/14/2000 Document Revised: 10/07/2013 Document Reviewed: 07/16/2013 Elsevier Interactive Patient Education [employer].

## 2016-01-20 DIAGNOSIS — I429 Cardiomyopathy, unspecified: Secondary | ICD-10-CM | POA: Insufficient documentation

## 2016-01-20 DIAGNOSIS — I5022 Chronic systolic (congestive) heart failure: Secondary | ICD-10-CM

## 2016-04-01 ENCOUNTER — Emergency Department: Payer: Managed Care, Other (non HMO)

## 2016-04-01 ENCOUNTER — Emergency Department
Admission: EM | Admit: 2016-04-01 | Discharge: 2016-04-01 | Disposition: A | Discharge status: Home / Self Care | Payer: Managed Care, Other (non HMO) | Attending: Emergency Medicine | Admitting: Emergency Medicine

## 2016-04-01 ENCOUNTER — Encounter: Payer: Self-pay | Admitting: Emergency Medicine

## 2016-04-01 DIAGNOSIS — Z792 Long term (current) use of antibiotics: Secondary | ICD-10-CM | POA: Insufficient documentation

## 2016-04-01 DIAGNOSIS — F17210 Nicotine dependence, cigarettes, uncomplicated: Secondary | ICD-10-CM | POA: Diagnosis not present

## 2016-04-01 DIAGNOSIS — I110 Hypertensive heart disease with heart failure: Secondary | ICD-10-CM | POA: Diagnosis not present

## 2016-04-01 DIAGNOSIS — Z791 Long term (current) use of non-steroidal anti-inflammatories (NSAID): Secondary | ICD-10-CM | POA: Diagnosis not present

## 2016-04-01 DIAGNOSIS — R1011 Right upper quadrant pain: Secondary | ICD-10-CM | POA: Insufficient documentation

## 2016-04-01 DIAGNOSIS — R109 Unspecified abdominal pain: Secondary | ICD-10-CM

## 2016-04-01 DIAGNOSIS — Z79899 Other long term (current) drug therapy: Secondary | ICD-10-CM | POA: Diagnosis not present

## 2016-04-01 DIAGNOSIS — I509 Heart failure, unspecified: Secondary | ICD-10-CM | POA: Insufficient documentation

## 2016-04-01 LAB — COMPREHENSIVE METABOLIC PANEL
ALT: 19 U/L (ref 17–63)
AST: 20 U/L (ref 15–41)
Albumin: 3.9 g/dL (ref 3.5–5.0)
Alkaline Phosphatase: 49 U/L (ref 38–126)
Anion gap: 5 (ref 5–15)
BUN: 16 mg/dL (ref 6–20)
CO2: 24 mmol/L (ref 22–32)
Calcium: 9.3 mg/dL (ref 8.9–10.3)
Chloride: 108 mmol/L (ref 101–111)
Creatinine, Ser: 1.11 mg/dL (ref 0.61–1.24)
GFR calc Af Amer: >60 mL/min (ref >60)
GFR calc non Af Amer: >60 mL/min (ref >60)
Glucose, Bld: 102 mg/dL — ABNORMAL HIGH (ref 65–99)
Potassium: 4.1 mmol/L (ref 3.5–5.1)
Sodium: 137 mmol/L (ref 135–145)
Total Bilirubin: 0.6 mg/dL (ref 0.3–1.2)
Total Protein: 6.6 g/dL (ref 6.5–8.1)

## 2016-04-01 LAB — CBC
HCT: 49 % (ref 40.0–52.0)
Hemoglobin: 15.9 g/dL (ref 13.0–18.0)
MCH: 28.4 pg (ref 26.0–34.0)
MCHC: 32.4 g/dL (ref 32.0–36.0)
MCV: 87.5 fL (ref 80.0–100.0)
Platelets: 199 10*3/uL (ref 150–440)
RBC: 5.6 MIL/uL (ref 4.40–5.90)
RDW: 16.4 % — ABNORMAL HIGH (ref 11.5–14.5)
WBC: 8 10*3/uL (ref 3.8–10.6)

## 2016-04-01 LAB — URINALYSIS COMPLETE WITH MICROSCOPIC (ARMC ONLY)
BACTERIA UA: NONE SEEN
Bilirubin Urine: NEGATIVE
GLUCOSE, UA: NEGATIVE mg/dL
Hgb urine dipstick: NEGATIVE
KETONES UR: NEGATIVE mg/dL
LEUKOCYTES UA: NEGATIVE
NITRITE: NEGATIVE
Protein, ur: 30 mg/dL — AB
SPECIFIC GRAVITY, URINE: 1.014 (ref 1.005–1.030)
Squamous Epithelial / LPF: NONE SEEN
pH: 5 (ref 5.0–8.0)

## 2016-04-01 LAB — LIPASE, BLOOD: Lipase: 22 U/L (ref 11–51)

## 2016-04-01 MED ORDER — ACETAMINOPHEN 500 MG PO TABS
ORAL_TABLET | ORAL | Status: AC
  Administered 2016-04-01: 1000 mg via ORAL
  Filled 2016-04-01: qty 2

## 2016-04-01 MED ORDER — ONDANSETRON 4 MG PO TBDP: 4.0000 mg | ORAL_TABLET | Freq: Four times a day (QID) | ORAL | Status: DC | PRN

## 2016-04-01 MED ORDER — OXYCODONE-ACETAMINOPHEN 5-325 MG PO TABS: 1.0000 | ORAL_TABLET | Freq: Four times a day (QID) | ORAL | Status: DC | PRN

## 2016-04-01 MED ORDER — DIATRIZOATE MEGLUMINE & SODIUM 66-10 % PO SOLN
15.0000 mL | Freq: Once | ORAL | Status: AC
  Administered 2016-04-01: 15 mL via ORAL
  Filled 2016-04-01: qty 30

## 2016-04-01 MED ORDER — ACETAMINOPHEN 500 MG PO TABS
1000.0000 mg | ORAL_TABLET | ORAL | Status: AC
  Administered 2016-04-01: 1000 mg via ORAL

## 2016-04-01 MED ORDER — IOPAMIDOL (ISOVUE-300) INJECTION 61%
100.0000 mL | Freq: Once | INTRAVENOUS | Status: AC | PRN
  Administered 2016-04-01: 100 mL via INTRAVENOUS
  Filled 2016-04-01: qty 100

## 2016-04-01 NOTE — ED Notes (Signed)
Patient transported to CT 

## 2016-04-01 NOTE — ED Provider Notes (Signed)
[facility] Emergency Department Provider Note  ____________________________________________  Time seen: Approximately 1:51 PM  I have reviewed the triage vital signs and the nursing notes.   HISTORY  Chief Complaint Abdominal Pain    HPI Stephen [name] is a 50 y.o. male patient reports he's been having some intermittent pain in the right upper abdomen and right flank for about 2-3 months. However, he noticed that over the last day or so this pain seems to be worsening. Reports it hurts quite a bit to try to lay on his right side, he gets a crampy and sharp pain primarily under the ribs on the right lower chest and upper abdomen that radiates towards the back. He was able to eat breakfast this morning. No fevers chills nausea or vomiting.  Denies any chest pain. No trouble breathing. No wheezing. Denies any new swelling or weight gain.     Past Medical History  Diagnosis Date  . Hypertension    also recent diagnosis of congestive heart failure  There are no active problems to display for this patient.   Past Surgical History  Procedure Laterality Date  . Knee surgery Right     Current Outpatient Rx  Name  Route  Sig  Dispense  Refill  . albuterol (PROVENTIL HFA;VENTOLIN HFA) 108 (90 Base) MCG/ACT inhaler   Inhalation   Inhale 2 puffs into the lungs every 4 (four) hours as needed for wheezing or shortness of breath.   1 Inhaler   0   . amoxicillin-clavulanate (AUGMENTIN) 875-125 MG tablet   Oral   Take 1 tablet by mouth every 12 (twelve) hours.   20 tablet   0   . benzonatate (TESSALON PERLES) 100 MG capsule   Oral   Take 1 capsule (100 mg total) by mouth 3 (three) times daily as needed for cough.   15 capsule   0   . furosemide (LASIX) 20 MG tablet   Oral   Take 1 tablet (20 mg total) by mouth daily. Patient taking differently: Take 20 mg by mouth as needed for edema.    10 tablet   0   . lisinopril-hydrochlorothiazide  (PRINZIDE,ZESTORETIC) 20-12.5 MG tablet   Oral   Take 1 tablet by mouth daily.   14 tablet   0   . lisinopril-hydrochlorothiazide (ZESTORETIC) 10-12.5 MG tablet   Oral   Take 1 tablet by mouth daily.   30 tablet   0   . ondansetron (ZOFRAN ODT) 4 MG disintegrating tablet   Oral   Take 1 tablet (4 mg total) by mouth every 6 (six) hours as needed for nausea or vomiting.   20 tablet   0   . oxyCODONE-acetaminophen (ROXICET) 5-325 MG tablet   Oral   Take 1 tablet by mouth every 6 (six) hours as needed for severe pain.   10 tablet   0     Allergies Review of patient's allergies indicates no known allergies.  History reviewed. No pertinent family history.  Social History Social History  Substance Use Topics  . Smoking status: Current Every Day Smoker    Types: Cigarettes  . Smokeless tobacco: None  . Alcohol Use: Yes    Review of Systems Constitutional: No fever/chills Eyes: No visual changes. ENT: No sore throat. Cardiovascular: Denies chest pain. Respiratory: Denies shortness of breath. Gastrointestinal:  No nausea, no vomiting. Felt a little constipated a few days ago, took laxatives and reports he had good bowel movement last couple days.  Genitourinary:  Negative for dysuria. Musculoskeletal: Negative for back pain except when it shoots from the right upper abdomen towards the middle of his back. Skin: Negative for rash. Neurological: Negative for headaches, focal weakness or numbness.  10-point ROS otherwise negative.  ____________________________________________   PHYSICAL EXAM:  VITAL SIGNS: ED Triage Vitals  Enc Vitals Group     BP --      Pulse --      Resp --      Temp --      Temp src --      SpO2 --      Weight --      Height --      Head Cir --      Peak Flow --      Pain Score 04/01/16 1219 8     Pain Loc --      Pain Edu? --      Excl. in [facility]? --    Constitutional: Alert and oriented. Well appearing and in no acute distress. Very  pleasant. Eyes: Conjunctivae are normal. PERRL. EOMI. Head: Atraumatic. Nose: No congestion/rhinnorhea. Mouth/Throat: Mucous membranes are moist.  Oropharynx non-erythematous. Neck: No stridor.   Cardiovascular: Normal rate, regular rhythm. Grossly normal heart sounds.  Good peripheral circulation. Respiratory: Normal respiratory effort.  No retractions. Lungs CTAB. Gastrointestinal: Soft and nontender except for some moderate tenderness in the mid right abdomen, also questionably an equivocal Murphy. No distention. No abdominal bruits. No CVA tenderness. Musculoskeletal: No lower extremity tenderness nor edema.  No joint effusions. Neurologic:  Normal speech and language. No gross focal neurologic deficits are appreciated. Skin:  Skin is warm, dry and intact. No rash noted. Psychiatric: Mood and affect are normal. Speech and behavior are normal.  ____________________________________________   LABS (all labs ordered are listed, but only abnormal results are displayed)  Labs Reviewed  COMPREHENSIVE METABOLIC PANEL - Abnormal; Notable for the following:    Glucose, Bld 102 (*)    All other components within normal limits  CBC - Abnormal; Notable for the following:    RDW 16.4 (*)    All other components within normal limits  URINALYSIS COMPLETEWITH MICROSCOPIC ([facility] ONLY) - Abnormal; Notable for the following:    Color, Urine YELLOW (*)    APPearance CLEAR (*)    Protein, ur 30 (*)    All other components within normal limits  LIPASE, BLOOD   ____________________________________________  EKG  EKG reviewed and interpreted by me at 1910 Compared with EKG from November 2016 no significant change Probable left ventricular hypertrophy The treatment radiating QRS 110 QTc 490 Nonspecific T-wave abnormality, probably associated with LVH is noted again as compared with previous ____________________________________________  RADIOLOGY  CT Abdomen Pelvis W Contrast (Final result)  Result time: 04/01/16 17:43:52   Final result by Rad Results In Interface (04/01/16 17:43:52)   Narrative:   CLINICAL DATA: Abdominal pain. Right flank pain.  EXAM: CT ABDOMEN AND PELVIS WITH CONTRAST  TECHNIQUE: Multidetector CT imaging of the abdomen and pelvis was performed using the standard protocol following bolus administration of intravenous contrast.  CONTRAST: 173mL ISOVUE-300 IOPAMIDOL (ISOVUE-300) INJECTION 61%  COMPARISON: None.  FINDINGS: Lower chest: Mild atelectasis/scarring at the left lung base. At least mild cardiomegaly, incompletely imaged.  Hepatobiliary: No masses or other significant abnormality.  Pancreas: No mass, inflammatory changes, or other significant abnormality.  Spleen: Within normal limits in size and appearance.  Adrenals/Urinary Tract: 1 mm left renal stone. 4 mm stone within the lower pole the right renal  pelvis. No hydronephrosis bilaterally. No perinephric fluid. No ureteral or bladder calculi. Bladder is unremarkable, partially decompressed.  Stomach/Bowel: Bowel is normal in caliber. No bowel wall thickening or evidence of bowel wall inflammation. Retrocecal appendix appears normal. Stomach appears normal.  Vascular/Lymphatic: No pathologically enlarged lymph nodes. No evidence of abdominal aortic aneurysm.  Reproductive: No mass or other significant abnormality.  Other: No free fluid or abscess collection. No free intraperitoneal air.  Musculoskeletal: No acute or suspicious osseous lesion. Mild degenerative change in the lumbar spine. Central partially calcified disc bold at L4-5 causing moderate central canal stenosis with possible associated nerve root impingement.  Small bilateral inguinal hernias which contain fat only. Superficial soft tissues otherwise unremarkable.  IMPRESSION: 1. Bilateral nephrolithiasis, largest stone on the right measuring 4 mm. No hydronephrosis bilaterally. No ureteral or bladder  calculi. 2. No evidence of acute intra-abdominal or intrapelvic abnormality. No free fluid or inflammatory change. No bowel obstruction. No evidence of acute solid organ abnormality. 3. Cardiomegaly, incompletely imaged. 4. Degenerative changes of the lumbar spine with most prominent disc bulge at the L4-5 level causing moderate central canal stenosis with possible associated nerve root impingement. This is a possible contributing source for flank pain. 5. Additional chronic/incidental findings detailed above.   Electronically Signed By: Franki Cabot M.D. On: 04/01/2016 17:43          US Abdomen Limited RUQ (Final result) Result time: 04/01/16 14:36:25   Final result by Rad Results In Interface (04/01/16 14:36:25)   Narrative:   CLINICAL DATA: Right upper quadrant abdominal pain for 3 months.  EXAM: US ABDOMEN LIMITED - RIGHT UPPER QUADRANT  COMPARISON: None.  FINDINGS: Gallbladder:  No gallstones or wall thickening visualized. No sonographic Murphy sign noted by sonographer.  Common bile duct:  Diameter: 4.6 mm which is within normal limits.  Liver:  No focal lesion identified. Within normal limits in parenchymal echogenicity.  IMPRESSION: No abnormality seen in the right upper quadrant of the abdomen.   Electronically Signed By: Marijo Conception, M.D. On: 04/01/2016 14:36          DG Chest 1 View (Final result) Result time: 04/01/16 14:26:48   Final result by Rad Results In Interface (04/01/16 14:26:48)   Narrative:   CLINICAL DATA: Acute right upper quadrant abdominal pain.  EXAM: CHEST 1 VIEW  COMPARISON: December 28, 2015.  FINDINGS: Stable cardiomegaly. No pneumothorax or pleural effusion is noted. No acute pulmonary disease is noted. Bony thorax is intact.  IMPRESSION: No acute cardiopulmonary abnormality seen.   Electronically Signed By: Marijo Conception, M.D. On: 04/01/2016 14:26        ____________________________________________   PROCEDURES  Procedure(s) performed: None  Critical Care performed: No  ____________________________________________   INITIAL IMPRESSION / ASSESSMENT AND PLAN / ED COURSE  Pertinent labs & imaging results that were available during my care of the patient were reviewed by me and considered in my medical decision making (see chart for details).  Differential diagnosis includes but is not limited to, abdominal perforation, aortic dissection, cholecystitis, appendicitis, diverticulitis, colitis, esophagitis/gastritis, kidney stone, pyelonephritis, urinary tract infection, aortic aneurysm. All are considered in decision and treatment plan. Based upon the patient's presentation and risk factors, we will initially evaluated for possible biliary causes given the intermittent nature right upper quadrant location and the equivocal Murphy sign on bedside exam. If ultrasound does not demonstrate a cause, would consider CT abdomen and pelvis for further evaluation.  No cardiopulmonary symptoms.  ----------------------------------------- 7:03 PM on 04/01/2016 -----------------------------------------  Patient resting comfortably. No distress. We will trial brief prescription of pain medicine for him, as he continues to have moderate discomfort especially with movement or laying on his right side.  Abdominal exam reassuring at this time. Patient no distress. Denies chest pain. Discuss careful return precautions.  I will prescribe the patient a narcotic pain medicine due to their condition which I anticipate will cause at least moderate pain short term. I discussed with the patient safe use of narcotic pain medicines, and that they are not to drive, work in dangerous areas, or ever take more than prescribed (no more than 1 pill every 6 hours). We discussed that this is the type of medication that can be  overdosed on and the risks of this type of  medicine. Patient is very agreeable to only use as prescribed and to never use more than prescribed.  Return precautions and treatment recommendations and follow-up discussed with the patient who is agreeable with the plan.  ____________________________________________   FINAL CLINICAL IMPRESSION(S) / ED DIAGNOSES  Final diagnoses:  Abdominal pain      Delman Kitten, MD 04/01/16 1907

## 2016-04-01 NOTE — Discharge Instructions (Signed)
You were seen in the emergency room for abdominal pain. It is important that you follow up closely with your primary care doctor in the next couple of days.  Please return to the emergency room right away if you are to develop a fever, severe nausea, your pain becomes severe or worsens, you are unable to keep food down, begin vomiting any dark or bloody fluid, you develop any dark or bloody stools, feel dehydrated, or other new concerns or symptoms arise.   Abdominal Pain, Adult Many things can cause abdominal pain. Usually, abdominal pain is not caused by a disease and will improve without treatment. It can often be observed and treated at home. Your health care provider will do a physical exam and possibly order blood tests and X-rays to help determine the seriousness of your pain. However, in many cases, more time must pass before a clear cause of the pain can be found. Before that point, your health care provider may not know if you need more testing or further treatment. HOME CARE INSTRUCTIONS Monitor your abdominal pain for any changes. The following actions may help to alleviate any discomfort you are experiencing:  Only take over-the-counter or prescription medicines as directed by your health care provider.  Do not take laxatives unless directed to do so by your health care provider.  Try a clear liquid diet (broth, tea, or water) as directed by your health care provider. Slowly move to a bland diet as tolerated. SEEK MEDICAL CARE IF:  You have unexplained abdominal pain.  You have abdominal pain associated with nausea or diarrhea.  You have pain when you urinate or have a bowel movement.  You experience abdominal pain that wakes you in the night.  You have abdominal pain that is worsened or improved by eating food.  You have abdominal pain that is worsened with eating fatty foods.  You have a fever. SEEK IMMEDIATE MEDICAL CARE IF:  Your pain does not go away within 2  hours.  You keep throwing up (vomiting).  Your pain is felt only in portions of the abdomen, such as the right side or the left lower portion of the abdomen.  You pass bloody or black tarry stools. MAKE SURE YOU:  Understand these instructions.  Will watch your condition.  Will get help right away if you are not doing well or get worse.   This information is not intended to replace advice given to you by your health care provider. Make sure you discuss any questions you have with your health care provider.   Document Released: 09/26/2005 Document Revised: 09/07/2015 Document Reviewed: 08/26/2013 Elsevier Interactive Patient Education [employer].

## 2016-04-01 NOTE — ED Notes (Signed)
Patient arrives to [facility] ED with c/o abd pain for 3 months. Patient has been to GI and has been scoped without cause for distress

## 2016-04-04 ENCOUNTER — Encounter: Payer: Self-pay | Admitting: Emergency Medicine

## 2016-04-04 ENCOUNTER — Emergency Department
Admission: EM | Admit: 2016-04-04 | Discharge: 2016-04-04 | Disposition: A | Discharge status: Home / Self Care | Payer: Managed Care, Other (non HMO) | Attending: Emergency Medicine | Admitting: Emergency Medicine

## 2016-04-04 DIAGNOSIS — I10 Essential (primary) hypertension: Secondary | ICD-10-CM | POA: Insufficient documentation

## 2016-04-04 DIAGNOSIS — R109 Unspecified abdominal pain: Secondary | ICD-10-CM | POA: Diagnosis present

## 2016-04-04 DIAGNOSIS — N200 Calculus of kidney: Secondary | ICD-10-CM | POA: Diagnosis not present

## 2016-04-04 DIAGNOSIS — Z792 Long term (current) use of antibiotics: Secondary | ICD-10-CM | POA: Insufficient documentation

## 2016-04-04 DIAGNOSIS — Z87891 Personal history of nicotine dependence: Secondary | ICD-10-CM | POA: Insufficient documentation

## 2016-04-04 DIAGNOSIS — Z79899 Other long term (current) drug therapy: Secondary | ICD-10-CM | POA: Diagnosis not present

## 2016-04-04 LAB — URINALYSIS COMPLETE WITH MICROSCOPIC (ARMC ONLY)
BILIRUBIN URINE: NEGATIVE
Bacteria, UA: NONE SEEN
GLUCOSE, UA: NEGATIVE mg/dL
KETONES UR: NEGATIVE mg/dL
Leukocytes, UA: NEGATIVE
Nitrite: NEGATIVE
Protein, ur: 100 mg/dL — AB
Specific Gravity, Urine: 1.025 (ref 1.005–1.030)
pH: 5 (ref 5.0–8.0)

## 2016-04-04 MED ORDER — ONDANSETRON 8 MG PO TBDP
8.0000 mg | ORAL_TABLET | Freq: Once | ORAL | Status: AC
  Administered 2016-04-04: 8 mg via ORAL
  Filled 2016-04-04: qty 1

## 2016-04-04 MED ORDER — TRAMADOL HCL 50 MG PO TABS: 50.0000 mg | ORAL_TABLET | Freq: Four times a day (QID) | ORAL | Status: DC | PRN

## 2016-04-04 NOTE — ED Notes (Signed)
See triage noted  Was seen couple of days ago with flank pain. States he is still having pain and noticed some blood in urine PTA.  Denies any n/v

## 2016-04-04 NOTE — ED Notes (Signed)
Pt from home via pov with right flank pain since Sunday. Yesterday and today he had blood in urine, but states that it has now stopped. Pt alert & oriented with NAD noted.

## 2016-04-04 NOTE — ED Provider Notes (Signed)
[facility] Emergency Department Provider Note ____________________________________________  Time seen: Approximately 12:08 PM  I have reviewed the triage vital signs and the nursing notes.   HISTORY  Chief Complaint Flank Pain   HPI Stephen [name] is a 50 y.o. male who presents today left sided flank pain. He came to the ED 3 days prior for which he had a CT scan of his abdomen that showed bilateral nephrolithiasis. He was given a prescription for pain medication, which he never picked up. He states that he was never told he had kidney stones and that when he began to urinate blood yesterday he became concerned. He continues to complain of the flank pain but no longer has any blood in his urine. Denies fever, constipation, and vomiting.   Past Medical History  Diagnosis Date  . Hypertension     There are no active problems to display for this patient.   Past Surgical History  Procedure Laterality Date  . Knee surgery Right     Current Outpatient Rx  Name  Route  Sig  Dispense  Refill  . albuterol (PROVENTIL HFA;VENTOLIN HFA) 108 (90 Base) MCG/ACT inhaler   Inhalation   Inhale 2 puffs into the lungs every 4 (four) hours as needed for wheezing or shortness of breath.   1 Inhaler   0   . amoxicillin-clavulanate (AUGMENTIN) 875-125 MG tablet   Oral   Take 1 tablet by mouth every 12 (twelve) hours.   20 tablet   0   . benzonatate (TESSALON PERLES) 100 MG capsule   Oral   Take 1 capsule (100 mg total) by mouth 3 (three) times daily as needed for cough.   15 capsule   0   . furosemide (LASIX) 20 MG tablet   Oral   Take 1 tablet (20 mg total) by mouth daily. Patient taking differently: Take 20 mg by mouth as needed for edema.    10 tablet   0   . lisinopril-hydrochlorothiazide (PRINZIDE,ZESTORETIC) 20-12.5 MG tablet   Oral   Take 1 tablet by mouth daily.   14 tablet   0   . lisinopril-hydrochlorothiazide (ZESTORETIC) 10-12.5 MG tablet    Oral   Take 1 tablet by mouth daily.   30 tablet   0   . ondansetron (ZOFRAN ODT) 4 MG disintegrating tablet   Oral   Take 1 tablet (4 mg total) by mouth every 6 (six) hours as needed for nausea or vomiting.   20 tablet   0   . oxyCODONE-acetaminophen (ROXICET) 5-325 MG tablet   Oral   Take 1 tablet by mouth every 6 (six) hours as needed for severe pain.   10 tablet   0   . traMADol (ULTRAM) 50 MG tablet   Oral   Take 1 tablet (50 mg total) by mouth every 6 (six) hours as needed for moderate pain.   12 tablet   0     Allergies Review of patient's allergies indicates no known allergies.  History reviewed. No pertinent family history.  Social History Social History  Substance Use Topics  . Smoking status: Former Smoker    Types: Cigarettes    Quit date: 02/20/2016  . Smokeless tobacco: None  . Alcohol Use: Yes    Review of Systems Constitutional: No fever/chills Eyes: No visual changes. ENT: No sore throat. Cardiovascular: Denies chest pain. Respiratory: Denies shortness of breath. Gastrointestinal: Positive for left sided flank pain.  No nausea, no vomiting.  No diarrhea.  No constipation. Genitourinary: Negative for dysuria. Negative for hematuria at this time. Musculoskeletal: Negative for back pain. Skin: Negative for rash. Neurological: Negative for headaches, focal weakness or numbness. 10-point ROS otherwise negative.  ____________________________________________   PHYSICAL EXAM:  VITAL SIGNS: ED Triage Vitals  Enc Vitals Group     BP 04/04/16 1132 177/121 mmHg     Pulse Rate 04/04/16 1132 81     Resp 04/04/16 1132 18     Temp 04/04/16 1132 98.3 F (36.8 C)     Temp Source 04/04/16 1132 Oral     SpO2 04/04/16 1132 97 %     Weight 04/04/16 1132 243 lb (110.224 kg)     Height 04/04/16 1132 6\' 2"  (1.88 m)     Head Cir --      Peak Flow --      Pain Score 04/04/16 1132 9     Pain Loc --      Pain Edu? --      Excl. in [place]? --     Constitutional: Alert and oriented. Well appearing and in no acute distress. Head: Atraumatic. Hematological/Lymphatic/Immunilogical: No cervical lymphadenopathy. Cardiovascular: Normal rate, regular rhythm. Grossly normal heart sounds.  Good peripheral circulation. Elevated blood pressure Respiratory: Normal respiratory effort.  No retractions. Lungs CTAB. Gastrointestinal:TTP on left side. No guarding or rebound tenderness. Soft, no distention. No abdominal bruits. No CVA tenderness. Musculoskeletal: No lower extremity tenderness nor edema.  No joint effusions.  Neurologic:  Normal speech and language. No gross focal neurologic deficits are appreciated. No gait instability. Skin:  Skin is warm, dry and intact. No rash noted. Psychiatric: Mood and affect are normal. Speech and behavior are normal.  ____________________________________________   LABS (all labs ordered are listed, but only abnormal results are displayed)  Labs Reviewed  URINALYSIS COMPLETEWITH MICROSCOPIC ([place]) - Abnormal; Notable for the following:    Color, Urine YELLOW (*)    APPearance CLEAR (*)    Hgb urine dipstick 1+ (*)    Protein, ur 100 (*)    Squamous Epithelial / LPF 0-5 (*)    All other components within normal limits   ____________________________________________  PROCEDURES  Procedure(s) performed: None  Critical Care performed: No  ____________________________________________   INITIAL IMPRESSION / ASSESSMENT AND PLAN / ED COURSE  Pertinent labs & imaging results that were available during my care of the patient were reviewed by me and considered in my medical decision making (see chart for details).  Flank pain post kidney stone passage. Tramadol prescription given to relieve residual flank pain. Advised to follow-up with PCP if condition persists. ____________________________________________   FINAL CLINICAL IMPRESSION(S) / ED DIAGNOSES  Final diagnoses:  Lt flank pain   Kidney stone      Sable Feil, PA-C 04/04/16 [place], MD 04/04/16 1336

## 2016-04-23 ENCOUNTER — Encounter: Payer: Self-pay | Admitting: *Deleted

## 2016-04-23 ENCOUNTER — Ambulatory Visit
Admission: EM | Admit: 2016-04-23 | Discharge: 2016-04-23 | Disposition: A | Discharge status: Home / Self Care | Payer: Managed Care, Other (non HMO) | Attending: Family Medicine | Admitting: Family Medicine

## 2016-04-23 DIAGNOSIS — M940 Chondrocostal junction syndrome [Tietze]: Secondary | ICD-10-CM

## 2016-04-23 DIAGNOSIS — I10 Essential (primary) hypertension: Secondary | ICD-10-CM

## 2016-04-23 MED ORDER — HYDROCODONE-ACETAMINOPHEN 5-325 MG PO TABS: ORAL_TABLET | ORAL | Status: DC

## 2016-04-23 MED ORDER — CLONIDINE HCL 0.1 MG PO TABS
0.1000 mg | ORAL_TABLET | Freq: Once | ORAL | Status: AC
  Administered 2016-04-23: 0.1 mg via ORAL

## 2016-04-23 NOTE — ED Provider Notes (Signed)
CSN: [id number]     Arrival date & time 04/23/16  1659 History   First MD Initiated Contact with Patient 04/23/16 1814     Chief Complaint  Patient presents with  . Back Pain   (Consider location/radiation/quality/duration/timing/severity/associated sxs/prior Treatment) HPI Comments: 50 yo male presents with a c/o right mid lateral chest wall pain and mid right back pain. Denies any injuries/trauma, fevers, chills, shortness of breath. States pain is worse sometimes when taking a deep breath or with movements.   The history is provided by the patient.    Past Medical History  Diagnosis Date  . Hypertension    Past Surgical History  Procedure Laterality Date  . Knee surgery Right    History reviewed. No pertinent family history. Social History  Substance Use Topics  . Smoking status: Former Smoker    Types: Cigarettes    Quit date: 02/20/2016  . Smokeless tobacco: Never Used  . Alcohol Use: Yes    Review of Systems  Allergies  Review of patient's allergies indicates no known allergies.  Home Medications   Prior to Admission medications   Medication Sig Start Date End Date Taking? Authorizing Provider  aspirin EC 81 MG tablet Take 81 mg by mouth daily.   Yes Historical Provider, MD  carvedilol (COREG) 3.125 MG tablet Take 3.125 mg by mouth 2 (two) times daily with a meal.   Yes Historical Provider, MD  lisinopril-hydrochlorothiazide (PRINZIDE,ZESTORETIC) 20-12.5 MG tablet Take 1 tablet by mouth daily. 12/28/15  Yes Marylene Land, NP  traMADol (ULTRAM) 50 MG tablet Take 1 tablet (50 mg total) by mouth every 6 (six) hours as needed for moderate pain. 04/04/16  Yes Sable Feil, PA-C  valsartan (DIOVAN) 160 MG tablet Take 160 mg by mouth daily.   Yes Historical Provider, MD  albuterol (PROVENTIL HFA;VENTOLIN HFA) 108 (90 Base) MCG/ACT inhaler Inhale 2 puffs into the lungs every 4 (four) hours as needed for wheezing or shortness of breath. 12/28/15   Marylene Land, NP    amoxicillin-clavulanate (AUGMENTIN) 875-125 MG tablet Take 1 tablet by mouth every 12 (twelve) hours. 12/28/15   Marylene Land, NP  benzonatate (TESSALON PERLES) 100 MG capsule Take 1 capsule (100 mg total) by mouth 3 (three) times daily as needed for cough. 12/28/15   Marylene Land, NP  furosemide (LASIX) 20 MG tablet Take 1 tablet (20 mg total) by mouth daily. Patient taking differently: Take 20 mg by mouth as needed for edema.  11/19/15 11/18/16  Delman Kitten, MD  HYDROcodone-acetaminophen (NORCO/VICODIN) 5-325 MG tablet 1-2 tabs po qd prn 04/23/16   Norval Gable, MD  lisinopril-hydrochlorothiazide (ZESTORETIC) 10-12.5 MG tablet Take 1 tablet by mouth daily. 11/19/15   Delman Kitten, MD  ondansetron (ZOFRAN ODT) 4 MG disintegrating tablet Take 1 tablet (4 mg total) by mouth every 6 (six) hours as needed for nausea or vomiting. 04/01/16   Delman Kitten, MD  oxyCODONE-acetaminophen (ROXICET) 5-325 MG tablet Take 1 tablet by mouth every 6 (six) hours as needed for severe pain. 04/01/16   Delman Kitten, MD   Meds Ordered and Administered this Visit   Medications  cloNIDine (CATAPRES) tablet 0.1 mg (0.1 mg Oral Given 04/23/16 1848)    BP 159/124 mmHg  Pulse 92  Temp(Src) 98.1 F (36.7 C) (Oral)  Resp 18  Ht 6' (1.829 m)  Wt 223 lb (101.152 kg)  BMI 30.24 kg/m2  SpO2 97% No data found.   Physical Exam  Constitutional: He appears well-developed and well-nourished. No distress.  Neck:  Normal range of motion. Neck supple. No tracheal deviation present.  Cardiovascular: Normal rate, regular rhythm and normal heart sounds.   Pulmonary/Chest: Effort normal. No stridor. No respiratory distress. He has no wheezes. He has no rales. He exhibits tenderness (right mid to lower ribs tenderness to palpation; no gross deformities or skin lesions).  Musculoskeletal:       Cervical back: He exhibits normal range of motion, no bony tenderness, no swelling, no edema, no deformity, no laceration, no pain and normal  pulse.       Thoracic back: He exhibits tenderness. He exhibits normal range of motion, no bony tenderness, no swelling, no edema, no deformity, no laceration, no pain, no spasm and normal pulse.       Lumbar back: He exhibits normal range of motion, no bony tenderness, no swelling, no edema, no deformity, no laceration, no pain and normal pulse.       Back:  Neurological: He is alert. He has normal reflexes. He exhibits normal muscle tone. Coordination normal.  Skin: No rash noted. He is not diaphoretic.  Nursing note and vitals reviewed.   ED Course  Procedures (including critical care time)  Labs Review Labs Reviewed - No data to display  Imaging Review No results found.   Visual Acuity Review  Right Eye Distance:   Left Eye Distance:   Bilateral Distance:    Right Eye Near:   Left Eye Near:    Bilateral Near:         MDM   1. Costochondritis   2. Essential hypertension    Discharge Medication List as of 04/23/2016  7:23 PM    START taking these medications   Details  HYDROcodone-acetaminophen (NORCO/VICODIN) 5-325 MG tablet 1-2 tabs po qd prn, Print       1. diagnosis reviewed with patient/parent 2. rx as per orders above; reviewed possible side effects, interactions, risks and benefits  3. Recommend supportive treatment with rest, ice, otc acetaminophen prn 4. Patient given clonidine 0.1mg  po x 1  5. Follow-up prn if symptoms worsen or don't improve    Norval Gable, MD 04/23/16 2147

## 2016-04-23 NOTE — ED Notes (Signed)
Patient has had symptom of back pain for 3 months. The pain is located in the thoracic region on the right side. Patient passed a kidney stone 2 weeks ago that originated in the right kidney.

## 2016-06-04 ENCOUNTER — Emergency Department: Payer: Managed Care, Other (non HMO)

## 2016-06-04 ENCOUNTER — Inpatient Hospital Stay
Admission: EM | Admit: 2016-06-04 | Discharge: 2016-06-06 | DRG: 292 | Disposition: A | Discharge status: Home / Self Care | Payer: Managed Care, Other (non HMO) | Attending: Internal Medicine | Admitting: Internal Medicine

## 2016-06-04 DIAGNOSIS — M545 Low back pain: Secondary | ICD-10-CM | POA: Diagnosis present

## 2016-06-04 DIAGNOSIS — R05 Cough: Secondary | ICD-10-CM | POA: Diagnosis present

## 2016-06-04 DIAGNOSIS — Z7982 Long term (current) use of aspirin: Secondary | ICD-10-CM | POA: Diagnosis not present

## 2016-06-04 DIAGNOSIS — R7989 Other specified abnormal findings of blood chemistry: Secondary | ICD-10-CM

## 2016-06-04 DIAGNOSIS — R0602 Shortness of breath: Secondary | ICD-10-CM

## 2016-06-04 DIAGNOSIS — I248 Other forms of acute ischemic heart disease: Secondary | ICD-10-CM | POA: Diagnosis present

## 2016-06-04 DIAGNOSIS — I509 Heart failure, unspecified: Secondary | ICD-10-CM

## 2016-06-04 DIAGNOSIS — Z87891 Personal history of nicotine dependence: Secondary | ICD-10-CM

## 2016-06-04 DIAGNOSIS — I110 Hypertensive heart disease with heart failure: Secondary | ICD-10-CM | POA: Diagnosis present

## 2016-06-04 DIAGNOSIS — I5023 Acute on chronic systolic (congestive) heart failure: Secondary | ICD-10-CM

## 2016-06-04 DIAGNOSIS — I428 Other cardiomyopathies: Secondary | ICD-10-CM | POA: Diagnosis present

## 2016-06-04 DIAGNOSIS — Z79899 Other long term (current) drug therapy: Secondary | ICD-10-CM | POA: Diagnosis not present

## 2016-06-04 DIAGNOSIS — M5489 Other dorsalgia: Secondary | ICD-10-CM

## 2016-06-04 DIAGNOSIS — R079 Chest pain, unspecified: Secondary | ICD-10-CM | POA: Diagnosis not present

## 2016-06-04 DIAGNOSIS — R778 Other specified abnormalities of plasma proteins: Secondary | ICD-10-CM

## 2016-06-04 DIAGNOSIS — Z8249 Family history of ischemic heart disease and other diseases of the circulatory system: Secondary | ICD-10-CM

## 2016-06-04 HISTORY — DX: Heart failure, unspecified: I50.9

## 2016-06-04 LAB — CBC
HCT: 47.5 % (ref 40.0–52.0)
Hemoglobin: 15.3 g/dL (ref 13.0–18.0)
MCH: 28.3 pg (ref 26.0–34.0)
MCHC: 32.3 g/dL (ref 32.0–36.0)
MCV: 87.5 fL (ref 80.0–100.0)
PLATELETS: 180 10*3/uL (ref 150–440)
RBC: 5.42 MIL/uL (ref 4.40–5.90)
RDW: 15.2 % — ABNORMAL HIGH (ref 11.5–14.5)
WBC: 8.4 10*3/uL (ref 3.8–10.6)

## 2016-06-04 LAB — BASIC METABOLIC PANEL
Anion gap: 9 (ref 5–15)
BUN: 20 mg/dL (ref 6–20)
CALCIUM: 9.3 mg/dL (ref 8.9–10.3)
CO2: 23 mmol/L (ref 22–32)
CREATININE: 1.22 mg/dL (ref 0.61–1.24)
Chloride: 108 mmol/L (ref 101–111)
Glucose, Bld: 160 mg/dL — ABNORMAL HIGH (ref 65–99)
Potassium: 3.9 mmol/L (ref 3.5–5.1)
SODIUM: 140 mmol/L (ref 135–145)

## 2016-06-04 LAB — BRAIN NATRIURETIC PEPTIDE: B NATRIURETIC PEPTIDE 5: 675 pg/mL — AB (ref 0.0–100.0)

## 2016-06-04 LAB — TROPONIN I
TROPONIN I: 0.14 ng/mL — AB (ref <0.031)
TROPONIN I: 0.16 ng/mL — AB (ref <0.031)

## 2016-06-04 MED ORDER — SODIUM CHLORIDE 0.9% FLUSH
3.0000 mL | Freq: Two times a day (BID) | INTRAVENOUS | Status: DC
  Administered 2016-06-04 – 2016-06-06 (×4): 3 mL via INTRAVENOUS

## 2016-06-04 MED ORDER — OXYCODONE HCL 5 MG PO TABS
5.0000 mg | ORAL_TABLET | ORAL | Status: DC | PRN
  Administered 2016-06-04 – 2016-06-05 (×2): 5 mg via ORAL
  Filled 2016-06-04 (×2): qty 1

## 2016-06-04 MED ORDER — ASPIRIN EC 81 MG PO TBEC
243.0000 mg | DELAYED_RELEASE_TABLET | Freq: Once | ORAL | Status: AC
  Administered 2016-06-04: 243 mg via ORAL
  Filled 2016-06-04: qty 3

## 2016-06-04 MED ORDER — NITROGLYCERIN 0.4 MG SL SUBL
0.4000 mg | SUBLINGUAL_TABLET | SUBLINGUAL | Status: DC | PRN
Start: 2016-06-04 — End: 2016-06-06

## 2016-06-04 MED ORDER — ASPIRIN 81 MG PO CHEW
81.0000 mg | CHEWABLE_TABLET | Freq: Every day | ORAL | Status: DC
  Administered 2016-06-05 – 2016-06-06 (×2): 81 mg via ORAL
  Filled 2016-06-04 (×2): qty 1

## 2016-06-04 MED ORDER — ADULT MULTIVITAMIN W/MINERALS CH
1.0000 | ORAL_TABLET | Freq: Every day | ORAL | Status: DC
  Administered 2016-06-05 – 2016-06-06 (×2): 1 via ORAL
  Filled 2016-06-04 (×2): qty 1

## 2016-06-04 MED ORDER — HYDRALAZINE HCL 20 MG/ML IJ SOLN
10.0000 mg | Freq: Four times a day (QID) | INTRAMUSCULAR | Status: DC | PRN
  Administered 2016-06-05: 10 mg via INTRAVENOUS
  Filled 2016-06-04: qty 1

## 2016-06-04 MED ORDER — VARENICLINE TARTRATE 0.5 MG PO TABS
0.5000 mg | ORAL_TABLET | Freq: Two times a day (BID) | ORAL | Status: DC
  Administered 2016-06-04 – 2016-06-06 (×4): 0.5 mg via ORAL
  Filled 2016-06-04 (×5): qty 1

## 2016-06-04 MED ORDER — IRBESARTAN 150 MG PO TABS
150.0000 mg | ORAL_TABLET | Freq: Every day | ORAL | Status: DC
  Administered 2016-06-04 – 2016-06-06 (×3): 150 mg via ORAL
  Filled 2016-06-04 (×3): qty 1

## 2016-06-04 MED ORDER — AMLODIPINE BESYLATE 5 MG PO TABS
5.0000 mg | ORAL_TABLET | Freq: Once | ORAL | Status: AC
  Administered 2016-06-04: 5 mg via ORAL
  Filled 2016-06-04: qty 1

## 2016-06-04 MED ORDER — ACETAMINOPHEN 650 MG RE SUPP: 650.0000 mg | Freq: Four times a day (QID) | RECTAL | Status: DC | PRN

## 2016-06-04 MED ORDER — ENOXAPARIN SODIUM 120 MG/0.8ML ~~LOC~~ SOLN
1.0000 mg/kg | Freq: Two times a day (BID) | SUBCUTANEOUS | Status: DC
  Administered 2016-06-05: 105 mg via SUBCUTANEOUS
  Filled 2016-06-04: qty 0.8

## 2016-06-04 MED ORDER — FUROSEMIDE 10 MG/ML IJ SOLN
20.0000 mg | Freq: Every day | INTRAMUSCULAR | Status: DC
  Administered 2016-06-05 – 2016-06-06 (×2): 20 mg via INTRAVENOUS
  Filled 2016-06-04 (×2): qty 2

## 2016-06-04 MED ORDER — ONDANSETRON HCL 4 MG PO TABS: 4.0000 mg | ORAL_TABLET | Freq: Four times a day (QID) | ORAL | Status: DC | PRN

## 2016-06-04 MED ORDER — ACETAMINOPHEN 325 MG PO TABS
650.0000 mg | ORAL_TABLET | Freq: Four times a day (QID) | ORAL | Status: DC | PRN
  Administered 2016-06-05: 650 mg via ORAL
  Filled 2016-06-04: qty 2

## 2016-06-04 MED ORDER — ALBUTEROL SULFATE (2.5 MG/3ML) 0.083% IN NEBU
2.5000 mg | INHALATION_SOLUTION | RESPIRATORY_TRACT | Status: DC | PRN
Start: 2016-06-04 — End: 2016-06-06

## 2016-06-04 MED ORDER — ENOXAPARIN SODIUM 120 MG/0.8ML ~~LOC~~ SOLN
1.0000 mg/kg | SUBCUTANEOUS | Status: AC
  Administered 2016-06-04: 105 mg via SUBCUTANEOUS
  Filled 2016-06-04: qty 0.8

## 2016-06-04 MED ORDER — CARVEDILOL 3.125 MG PO TABS: 3.1250 mg | ORAL_TABLET | Freq: Two times a day (BID) | ORAL | Status: DC

## 2016-06-04 MED ORDER — IOPAMIDOL (ISOVUE-370) INJECTION 76%
100.0000 mL | Freq: Once | INTRAVENOUS | Status: AC | PRN
  Administered 2016-06-04: 100 mL via INTRAVENOUS

## 2016-06-04 MED ORDER — ENOXAPARIN SODIUM 120 MG/0.8ML ~~LOC~~ SOLN: 1.0000 mg/kg | Freq: Once | SUBCUTANEOUS | Status: DC

## 2016-06-04 MED ORDER — FUROSEMIDE 10 MG/ML IJ SOLN
40.0000 mg | Freq: Once | INTRAMUSCULAR | Status: AC
  Administered 2016-06-04: 40 mg via INTRAVENOUS
  Filled 2016-06-04: qty 4

## 2016-06-04 MED ORDER — ONDANSETRON HCL 4 MG/2ML IJ SOLN: 4.0000 mg | Freq: Four times a day (QID) | INTRAMUSCULAR | Status: DC | PRN

## 2016-06-04 NOTE — Progress Notes (Signed)
ANTICOAGULATION CONSULT NOTE - Initial Consult  Pharmacy Consult for Lovenox  Indication: chest pain/ACS  No Known Allergies  Patient Measurements: Height: 6\' 1"  (185.4 cm) Weight: 230 lb (104.327 kg) IBW/kg (Calculated) : 79.9   Vital Signs: Temp: 97.8 F (36.6 C) (06/05 1132) Temp Source: Oral (06/05 1132) BP: 146/107 mmHg (06/05 1132) Pulse Rate: 90 (06/05 1132)  Labs:  Recent Labs  06/04/16 1134  HGB 15.3  HCT 47.5  PLT 180  CREATININE 1.22  TROPONINI 0.14*    Estimated Creatinine Clearance: 90.9 mL/min (by C-G formula based on Cr of 1.22).   Medical History: Past Medical History  Diagnosis Date  . Hypertension   . CHF (congestive heart failure) (HCC)     nonischemic cardiomyopathy, EF 25%    Medications:  Scheduled:  . enoxaparin (LOVENOX) injection  1 mg/kg Subcutaneous STAT  . [START ON 06/05/2016] enoxaparin (LOVENOX) injection  1 mg/kg Subcutaneous Once  . [START ON 06/05/2016] enoxaparin (LOVENOX) injection  1 mg/kg Subcutaneous Q12H  . [START ON 06/05/2016] furosemide  20 mg Intravenous Daily  . furosemide  40 mg Intravenous Once   Infusions:    Assessment: 50 y/o M with known h/o nonischemic cardiomyopathy with EF of 25 % admitted with back pain and elevated troponin to begin LMWH full-dose for r/o NSTEMI. Patient not on anticoagulants PTA.   Goal of Therapy:  Monitor platelets by anticoagulation protocol: Yes   Plan:  Lovenox 105 mg [place] q 12 hours.   Ulice Dash D 06/04/2016,3:47 PM

## 2016-06-04 NOTE — ED Provider Notes (Signed)
[facility] Emergency Department Provider Note  ____________________________________________  Time seen: Approximately 1:07 PM  I have reviewed the triage vital signs and the nursing notes.   HISTORY  Chief Complaint Chest Pain    HPI Stephen [name] is a 50 y.o. male with a history of HTN and CHF presenting with a "knot" sensation under the right shoulder blade. The patient reports that since September he has been having twice monthly episodes of a "knot" sensation under the right shoulder blade which last for several hours and resolves spontaneously.Occasionally, he also has episodes of shortness of breath which occur while he is at rest. These sometimes occur with a knot sensation and sometimes are unrelated. He does not have any chest pain, tightness or pressure, palpitations, lightheadedness or syncope. He does have intermittent bilateral lower extremity swelling, which is responsive to Lasix. The patient is here today because he had another episode of the knot sensation under his right shoulder blade, and has not gone away. It is not any different in severity or character than the pain that he has had since September.  Patient denies tobacco or cocaine use.   Past Medical History  Diagnosis Date  . Hypertension   . CHF (congestive heart failure) ([place])     There are no active problems to display for this patient.   Past Surgical History  Procedure Laterality Date  . Knee surgery Right     Current Outpatient Rx  Name  Route  Sig  Dispense  Refill  . albuterol (PROVENTIL HFA;VENTOLIN HFA) 108 (90 Base) MCG/ACT inhaler   Inhalation   Inhale 2 puffs into the lungs every 4 (four) hours as needed for wheezing or shortness of breath.   1 Inhaler   0   . amoxicillin-clavulanate (AUGMENTIN) 875-125 MG tablet   Oral   Take 1 tablet by mouth every 12 (twelve) hours.   20 tablet   0   . aspirin EC 81 MG tablet   Oral   Take 81 mg by mouth daily.          . benzonatate (TESSALON PERLES) 100 MG capsule   Oral   Take 1 capsule (100 mg total) by mouth 3 (three) times daily as needed for cough.   15 capsule   0   . carvedilol (COREG) 3.125 MG tablet   Oral   Take 3.125 mg by mouth 2 (two) times daily with a meal.         . furosemide (LASIX) 20 MG tablet   Oral   Take 1 tablet (20 mg total) by mouth daily. Patient taking differently: Take 20 mg by mouth as needed for edema.    10 tablet   0   . HYDROcodone-acetaminophen (NORCO/VICODIN) 5-325 MG tablet      1-2 tabs po qd prn   6 tablet   0   . lisinopril-hydrochlorothiazide (PRINZIDE,ZESTORETIC) 20-12.5 MG tablet   Oral   Take 1 tablet by mouth daily.   14 tablet   0   . lisinopril-hydrochlorothiazide (ZESTORETIC) 10-12.5 MG tablet   Oral   Take 1 tablet by mouth daily.   30 tablet   0   . ondansetron (ZOFRAN ODT) 4 MG disintegrating tablet   Oral   Take 1 tablet (4 mg total) by mouth every 6 (six) hours as needed for nausea or vomiting.   20 tablet   0   . oxyCODONE-acetaminophen (ROXICET) 5-325 MG tablet   Oral   Take 1 tablet  by mouth every 6 (six) hours as needed for severe pain.   10 tablet   0   . traMADol (ULTRAM) 50 MG tablet   Oral   Take 1 tablet (50 mg total) by mouth every 6 (six) hours as needed for moderate pain.   12 tablet   0   . valsartan (DIOVAN) 160 MG tablet   Oral   Take 160 mg by mouth daily.           Allergies Review of patient's allergies indicates no known allergies.  No family history on file.  Social History Social History  Substance Use Topics  . Smoking status: Former Smoker    Types: Cigarettes    Quit date: 02/20/2016  . Smokeless tobacco: Never Used  . Alcohol Use: Yes    Review of Systems Constitutional: No fever/chills.No lightheadedness or syncope. Eyes: No visual changes. ENT: No sore throat. No congestion or rhinorrhea. Cardiovascular: Denies chest pain. Denies palpitations. Respiratory:  Positive shortness of breath.  No cough. Gastrointestinal: No abdominal pain.  No nausea, no vomiting.  No diarrhea.  No constipation. Genitourinary: Negative for dysuria. Musculoskeletal: Negative for back pain. Positive pain below the right shoulder blade. Positive intermittent bilateral symmetric lower extremity swelling without calf pain Skin: Negative for rash. Neurological: Negative for headaches. No focal numbness, tingling or weakness.   10-point ROS otherwise negative.  ____________________________________________   PHYSICAL EXAM:  VITAL SIGNS: ED Triage Vitals  Enc Vitals Group     BP 06/04/16 1132 146/107 mmHg     Pulse Rate 06/04/16 1132 90     Resp 06/04/16 1132 18     Temp 06/04/16 1132 97.8 F (36.6 C)     Temp Source 06/04/16 1132 Oral     SpO2 06/04/16 1132 98 %     Weight 06/04/16 1132 230 lb (104.327 kg)     Height 06/04/16 1132 6\' 1"  (1.854 m)     Head Cir --      Peak Flow --      Pain Score 06/04/16 1132 6     Pain Loc --      Pain Edu? --      Excl. in [place]? --     Constitutional: Alert and oriented. Well appearing and in no acute distress. Answers questions appropriately. Eyes: Conjunctivae are normal.  EOMI. No scleral icterus. Head: Atraumatic. Nose: No congestion/rhinnorhea. Mouth/Throat: Mucous membranes are moist.  Neck: No stridor.  Supple.  No JVD. Cardiovascular: Normal rate, regular rhythm. No murmurs, rubs or gallops.  Respiratory: Normal respiratory effort.  No accessory muscle use or retractions. Lungs CTAB.  No wheezes, rales or ronchi. Patient has a 3 x 3" lipoma on the left shoulder blade. He has no reproducible tenderness to palpation over the right shoulder blade or below the right shoulder blade, no skin changes. Gastrointestinal: Soft, nontender and nondistended.  No guarding or rebound.  No peritoneal signs. Musculoskeletal: Mild nonpitting lower extremity edema around the ankles and distal tibia bilaterally that is symmetric.Marland Kitchen No  ttp in the calves or palpable cords.  Negative Homan's sign. Neurologic:  A&Ox3.  Speech is clear.  Face and smile are symmetric.  EOMI.  Moves all extremities well. Skin:  Skin is warm, dry and intact. No rash noted. Psychiatric: Mood and affect are normal. Speech and behavior are normal.  Normal judgement.  ____________________________________________   LABS (all labs ordered are listed, but only abnormal results are displayed)  Labs Reviewed  BASIC METABOLIC PANEL - Abnormal;  Notable for the following:    Glucose, Bld 160 (*)    All other components within normal limits  CBC - Abnormal; Notable for the following:    RDW 15.2 (*)    All other components within normal limits  TROPONIN I - Abnormal; Notable for the following:    Troponin I 0.14 (*)    All other components within normal limits  BRAIN NATRIURETIC PEPTIDE - Abnormal; Notable for the following:    B Natriuretic Peptide 675.0 (*)    All other components within normal limits   ____________________________________________  EKG  ED ECG REPORT I, Eula Listen, the attending physician, personally viewed and interpreted this ECG.   Date: 06/04/2016  EKG Time: 1136  Rate: 91  Rhythm: normal sinus rhythm, PVC's  Axis: Leftward  Intervals:first-degree A-V block   ST&T Change: No ST elevation.  ____________________________________________  RADIOLOGY  Dg Chest 2 View  06/04/2016  CLINICAL DATA:  RIGHT posterior rib in chest pain with coughing and deep breathing, had CHF in in December, history hypertension, former smoker EXAM: CHEST  2 VIEW COMPARISON:  04/01/2016 FINDINGS: Enlargement of cardiac silhouette with pulmonary vascular congestion. Tortuous thoracic aorta. Lungs clear. Question underlying emphysematous changes. No pleural effusion or pneumothorax. Bones unremarkable. IMPRESSION: Enlargement of cardiac silhouette with pulmonary vascular congestion. Question underlying COPD. No acute abnormalities.  Electronically Signed   By: Lavonia Dana M.D.   On: 06/04/2016 12:51   Ct Angio Chest Pe W/cm &/or Wo Cm  06/04/2016  CLINICAL DATA:  Right chest and rib pain with cough. Shortness of breath. EXAM: CT ANGIOGRAPHY CHEST WITH CONTRAST TECHNIQUE: Multidetector CT imaging of the chest was performed using the standard protocol during bolus administration of intravenous contrast. Multiplanar CT image reconstructions and MIPs were obtained to evaluate the vascular anatomy. CONTRAST:  100 mL of Isovue 370 COMPARISON:  None. FINDINGS: The central airways are within normal limits. No pneumothorax. There is atelectasis in the lingula. There is a 5 mm nodule in the right lung base on series 6, image 86. No other suspicious nodules. No masses or other infiltrates. No adenopathy. No effusion. Cardiomegaly is identified. No overt edema. The thoracic aorta is normal in caliber. No pulmonary emboli. Evaluation of the upper abdomen demonstrates mild increased attenuation of fat adjacent to the upper pole of left kidney. This was at least partially present in April of 2017 and may be chronic. Recommend clinical correlation. The upper abdomen is otherwise unremarkable. The bones are unremarkable. Review of the MIP images confirms the above findings. IMPRESSION: 1. No pulmonary emboli.  No acute abnormalities in the lungs. 2. 5 mm nodule in the right lung. See below for follow-up recommendations. 3. Mild increased attenuation in the fat around the upper pole of the left kidney was at least partially present in April 2017 and may be chronic. Recommend clinical correlation. No follow-up needed if patient is low-risk. Non-contrast chest CT can be considered in 12 months if patient is high-risk. This recommendation follows the consensus statement: Guidelines for Management of Incidental Pulmonary Nodules Detected on CT Images:From the Fleischner Society 2017; published online before print (10.1148/radiol.IJ:2314499). Electronically Signed    By: Dorise Bullion III M.D   On: 06/04/2016 14:10    ____________________________________________   PROCEDURES  Procedure(s) performed: None  Critical Care performed: No ____________________________________________   INITIAL IMPRESSION / ASSESSMENT AND PLAN / ED COURSE  Pertinent labs & imaging results that were available during my care of the patient were reviewed by me and  considered in my medical decision making (see chart for details).  50 y.o. male with a history of HTN and CHF presenting with episodic pain below the right shoulder blade. Overall, the patient is well-appearing. He is mildly hypertensive here, within normal cardiopulmonary examination. From triage, the patient had laboratory studies which do show an elevated troponin at 0.17. He is not having any active chest pain, nor has he been experiencing any chest pain in the recent past. However, given the pain in his back, he may be presenting with atypical angina or atypical ACS or MI. He will need to have this troponin trended. In the meantime, we'll also get a CT of the chest to evaluate for PE, although his pain is not pleuritic, he is not hypoxic, nor does he have any evidence of acute DVT. Aortic pathology would also be much less likely, although a chronic tear could be possible. I will treat the patient with aspirin, and see if his pain is responsive to nitroglycerin. Anticipate admission for this patient.  ----------------------------------------- 2:46 PM on 06/04/2016 -----------------------------------------  The patient remains hemodynamically stable. His CT scan does not show any acute process. His BNP is elevated in the 600s which is greater than his baseline. He will be admitted to continue his cardiac evaluation.  ____________________________________________  FINAL CLINICAL IMPRESSION(S) / ED DIAGNOSES  Final diagnoses:  Other back pain  Shortness of breath  Acute on chronic congestive heart failure,  unspecified congestive heart failure type (HCC)  Elevated troponin      NEW MEDICATIONS STARTED DURING THIS VISIT:  New Prescriptions   No medications on file     Eula Listen, MD 06/04/16 1447

## 2016-06-04 NOTE — H&P (Addendum)
[facility] [place] NAME: Stephen [name]    MR#:  [id number]  DATE OF BIRTH:  07-Sep-1966  DATE OF ADMISSION:  06/04/2016  PRIMARY CARE PHYSICIAN: [name], MD   REQUESTING/REFERRING PHYSICIAN: Dr. Eula Listen  CHIEF COMPLAINT:   Chief Complaint  Patient presents with  . Chest Pain    HISTORY OF PRESENT ILLNESS:  Stephen [name]  is a 50 y.o. male with a known history of  Hypertension, nonischemic cardiomyopathy with ejection fraction of 25% diagnosed about 6 months ago presents to the hospital secondary to back pain and worsening dyspnea.  Patient has some baseline dyspnea due to his cardiomyopathy. He was in the process of being evaluated for AICD as an outpatient. He occasionally gets spasms on the right side of his chest. He thinks it's because of his cough that has been going on for several months. He has a dry cough. Denies any fevers, chills, nausea or vomiting. Denies any substernal chest pain. Today the back pain was associated with worsening dyspnea and so presented to the emergency room. CT of the chest negative for PE. Troponin is elevated at 0.17. So he is being admitted for the same.  PAST MEDICAL HISTORY:   Past Medical History  Diagnosis Date  . Hypertension   . CHF (congestive heart failure) (HCC)     nonischemic cardiomyopathy, EF 25%    PAST SURGICAL HISTORY:   Past Surgical History  Procedure Laterality Date  . Knee surgery Right     SOCIAL HISTORY:   Social History  Substance Use Topics  . Smoking status: Former Smoker    Types: Cigarettes    Quit date: 02/20/2016  . Smokeless tobacco: Never Used     Comment: quit 2 months ago  . Alcohol Use: 0.0 oz/week    0 Standard drinks or equivalent per week     Comment: beer regularly    FAMILY HISTORY:   Family History  Problem Relation Age of Onset  . Hypertension Father   . Hypertension Mother     DRUG ALLERGIES:  No Known  Allergies  REVIEW OF SYSTEMS:   Review of Systems  Constitutional: Negative for fever, chills, weight loss and malaise/fatigue.  HENT: Negative for ear discharge, ear pain, hearing loss and nosebleeds.   Eyes: Negative for blurred vision, double vision and photophobia.  Respiratory: Positive for cough and shortness of breath. Negative for hemoptysis and wheezing.   Cardiovascular: Positive for leg swelling. Negative for chest pain, palpitations and orthopnea.  Gastrointestinal: Negative for heartburn, nausea, vomiting, abdominal pain, diarrhea, constipation and melena.  Genitourinary: Negative for dysuria, urgency and frequency.  Musculoskeletal: Positive for back pain. Negative for myalgias and neck pain.  Skin: Negative for rash.  Neurological: Negative for dizziness, tingling, tremors, sensory change, speech change, focal weakness and headaches.  Endo/Heme/Allergies: Does not bruise/bleed easily.  Psychiatric/Behavioral: Negative for depression.    MEDICATIONS AT HOME:   Prior to Admission medications   Medication Sig Start Date End Date Taking? Authorizing Provider  albuterol (PROVENTIL HFA;VENTOLIN HFA) 108 (90 Base) MCG/ACT inhaler Inhale 2 puffs into the lungs every 4 (four) hours as needed for wheezing or shortness of breath. 12/28/15  Yes Marylene Land, NP  aspirin 81 MG chewable tablet Chew 81 mg by mouth daily.   Yes Historical Provider, MD  carvedilol (COREG) 3.125 MG tablet Take 1.5625 mg by mouth 2 (two) times daily with a meal.    Yes Historical Provider, MD  furosemide (LASIX) 20 MG tablet Take 20 mg by mouth daily as needed for edema.   Yes Historical Provider, MD  Multiple Vitamin (MULTIVITAMIN WITH MINERALS) TABS tablet Take 1 tablet by mouth daily.   Yes Historical Provider, MD  valsartan (DIOVAN) 160 MG tablet Take 160 mg by mouth daily.   Yes Historical Provider, MD  varenicline (CHANTIX) 0.5 MG tablet Take 0.5 mg by mouth 2 (two) times daily.   Yes Historical  Provider, MD  ondansetron (ZOFRAN ODT) 4 MG disintegrating tablet Take 1 tablet (4 mg total) by mouth every 6 (six) hours as needed for nausea or vomiting. Patient not taking: Reported on 06/04/2016 04/01/16   Delman Kitten, MD  oxyCODONE-acetaminophen (ROXICET) 5-325 MG tablet Take 1 tablet by mouth every 6 (six) hours as needed for severe pain. Patient not taking: Reported on 06/04/2016 04/01/16   Delman Kitten, MD  traMADol (ULTRAM) 50 MG tablet Take 1 tablet (50 mg total) by mouth every 6 (six) hours as needed for moderate pain. Patient not taking: Reported on 06/04/2016 04/04/16   Sable Feil, PA-C      VITAL SIGNS:  Blood pressure 146/107, pulse 90, temperature 97.8 F (36.6 C), temperature source Oral, resp. rate 18, height 6\' 1"  (1.854 m), weight 104.327 kg (230 lb), SpO2 98 %.  PHYSICAL EXAMINATION:   Physical Exam  GENERAL:  50 y.o.-year-old patient lying in the bed with no acute distress.  EYES: Pupils equal, round, reactive to light and accommodation. No scleral icterus. Extraocular muscles intact.  HEENT: Head atraumatic, normocephalic. Oropharynx and nasopharynx clear.  NECK:  Supple, no jugular venous distention. No thyroid enlargement, no tenderness.  LUNGS: Normal breath sounds bilaterally, no wheezing, rhonchi or crepitation. No use of accessory muscles of respiration. Bibasilar crackles heard CARDIOVASCULAR: S1, S2 normal. No rubs, or gallops. 2/6 systolic murmur present ABDOMEN: Soft, nontender, nondistended. Bowel sounds present. No organomegaly or mass.  EXTREMITIES: No cyanosis, or clubbing. Trace pedal edema noted. NEUROLOGIC: Cranial nerves II through XII are intact. Muscle strength 5/5 in all extremities. Sensation intact. Gait not checked.  PSYCHIATRIC: The patient is alert and oriented x 3.  SKIN: No obvious rash, lesion, or ulcer.   LABORATORY PANEL:   CBC  Recent Labs Lab 06/04/16 1134  WBC 8.4  HGB 15.3  HCT 47.5  PLT 180    ------------------------------------------------------------------------------------------------------------------  Chemistries   Recent Labs Lab 06/04/16 1134  NA 140  K 3.9  CL 108  CO2 23  GLUCOSE 160*  BUN 20  CREATININE 1.22  CALCIUM 9.3   ------------------------------------------------------------------------------------------------------------------  Cardiac Enzymes  Recent Labs Lab 06/04/16 1134  TROPONINI 0.14*   ------------------------------------------------------------------------------------------------------------------  RADIOLOGY:  Dg Chest 2 View  06/04/2016  CLINICAL DATA:  RIGHT posterior rib in chest pain with coughing and deep breathing, had CHF in in December, history hypertension, former smoker EXAM: CHEST  2 VIEW COMPARISON:  04/01/2016 FINDINGS: Enlargement of cardiac silhouette with pulmonary vascular congestion. Tortuous thoracic aorta. Lungs clear. Question underlying emphysematous changes. No pleural effusion or pneumothorax. Bones unremarkable. IMPRESSION: Enlargement of cardiac silhouette with pulmonary vascular congestion. Question underlying COPD. No acute abnormalities. Electronically Signed   By: Lavonia Dana M.D.   On: 06/04/2016 12:51   Ct Angio Chest Pe W/cm &/or Wo Cm  06/04/2016  CLINICAL DATA:  Right chest and rib pain with cough. Shortness of breath. EXAM: CT ANGIOGRAPHY CHEST WITH CONTRAST TECHNIQUE: Multidetector CT imaging of the chest was performed using the standard protocol during bolus administration of intravenous contrast.  Multiplanar CT image reconstructions and MIPs were obtained to evaluate the vascular anatomy. CONTRAST:  100 mL of Isovue 370 COMPARISON:  None. FINDINGS: The central airways are within normal limits. No pneumothorax. There is atelectasis in the lingula. There is a 5 mm nodule in the right lung base on series 6, image 86. No other suspicious nodules. No masses or other infiltrates. No adenopathy. No effusion.  Cardiomegaly is identified. No overt edema. The thoracic aorta is normal in caliber. No pulmonary emboli. Evaluation of the upper abdomen demonstrates mild increased attenuation of fat adjacent to the upper pole of left kidney. This was at least partially present in April of 2017 and may be chronic. Recommend clinical correlation. The upper abdomen is otherwise unremarkable. The bones are unremarkable. Review of the MIP images confirms the above findings. IMPRESSION: 1. No pulmonary emboli.  No acute abnormalities in the lungs. 2. 5 mm nodule in the right lung. See below for follow-up recommendations. 3. Mild increased attenuation in the fat around the upper pole of the left kidney was at least partially present in April 2017 and may be chronic. Recommend clinical correlation. No follow-up needed if patient is low-risk. Non-contrast chest CT can be considered in 12 months if patient is high-risk. This recommendation follows the consensus statement: Guidelines for Management of Incidental Pulmonary Nodules Detected on CT Images:From the Fleischner Society 2017; published online before print (10.1148/radiol.IJ:2314499). Electronically Signed   By: Dorise Bullion III M.D   On: 06/04/2016 14:10    EKG:   Orders placed or performed during the hospital encounter of 06/04/16  . ED EKG within 10 minutes  . ED EKG within 10 minutes    IMPRESSION AND PLAN:   Stephen [name]  is a 50 y.o. male with a known history of  Hypertension, nonischemic cardiomyopathy with ejection fraction of 25% diagnosed about 6 months ago presents to the hospital secondary to back pain and worsening dyspnea.  #1 elevated troponin- could be NSTEMI vs demand ischemia Admit to tele, recycle troponins On therapeutic dose lovenox Cards consulted, also on asa and coreg Monitor closely, prn nitro  #2 CHF-Nonischemic cardiomyopathy with last known ejection fraction of 25-30%. No prior cardiac catheterization done. -Mild exacerbation  noted with trace pedal edema and dyspnea. CT chest negative for any other findings. -Change Lasix to daily while in the hospital. Echocardiogram ordered and also cardiology consult pending.  #3 hypertension-continue home medications. On ARB and carvedilol  #4 Tobacco use disorder- quit about 2 months ago. Continue Chantix  #5 DVT Prophylaxis- on lovenox    All the records are reviewed and case discussed with ED provider. Management plans discussed with the patient, family and they are in agreement.  CODE STATUS: Full Code  TOTAL TIME TAKING CARE OF THIS PATIENT: 50 minutes.    [name] M.D on 06/04/2016 at 3:32 PM  Between 7am to 6pm - Pager - [phone]  After 6pm go to [email or web address] - password [email or web address] [facility]  [facility]  Office  [phone]  CC: Primary care physician; [name], MD

## 2016-06-04 NOTE — Progress Notes (Signed)
Patient admitted to unit from ED for chest pain, B/P elevated, 142/115, MD notified, new order received,

## 2016-06-04 NOTE — ED Notes (Signed)
Pt c/o right posterior rib/chest pain with cough and deep breathing .Marland Kitchen States he has not felt well since dx with CHF back in December. Pt is in NAD at present.Marland Kitchen

## 2016-06-05 ENCOUNTER — Inpatient Hospital Stay
Admit: 2016-06-05 | Discharge: 2016-06-05 | Disposition: A | Discharge status: Home / Self Care | Payer: Managed Care, Other (non HMO) | Attending: Internal Medicine | Admitting: Internal Medicine

## 2016-06-05 LAB — CBC
HEMATOCRIT: 46 % (ref 40.0–52.0)
Hemoglobin: 15 g/dL (ref 13.0–18.0)
MCH: 28.3 pg (ref 26.0–34.0)
MCHC: 32.7 g/dL (ref 32.0–36.0)
MCV: 86.6 fL (ref 80.0–100.0)
Platelets: 186 10*3/uL (ref 150–440)
RBC: 5.31 MIL/uL (ref 4.40–5.90)
RDW: 15.5 % — AB (ref 11.5–14.5)
WBC: 8.3 10*3/uL (ref 3.8–10.6)

## 2016-06-05 LAB — BASIC METABOLIC PANEL
Anion gap: 7 (ref 5–15)
BUN: 22 mg/dL — ABNORMAL HIGH (ref 6–20)
CHLORIDE: 108 mmol/L (ref 101–111)
CO2: 24 mmol/L (ref 22–32)
CREATININE: 1.03 mg/dL (ref 0.61–1.24)
Calcium: 9 mg/dL (ref 8.9–10.3)
GFR calc non Af Amer: >60 mL/min (ref >60)
Glucose, Bld: 89 mg/dL (ref 65–99)
POTASSIUM: 3.6 mmol/L (ref 3.5–5.1)
Sodium: 139 mmol/L (ref 135–145)

## 2016-06-05 LAB — ECHOCARDIOGRAM COMPLETE
Height: 73 in
Weight: 3680 oz

## 2016-06-05 LAB — TROPONIN I
Troponin I: 0.14 ng/mL — ABNORMAL HIGH (ref <0.031)
Troponin I: 0.11 ng/mL — ABNORMAL HIGH (ref <0.031)

## 2016-06-05 MED ORDER — FUROSEMIDE 20 MG PO TABS: 20.0000 mg | ORAL_TABLET | Freq: Every day | ORAL | Status: DC

## 2016-06-05 MED ORDER — ENOXAPARIN SODIUM 40 MG/0.4ML ~~LOC~~ SOLN
40.0000 mg | SUBCUTANEOUS | Status: DC
  Administered 2016-06-06: 40 mg via SUBCUTANEOUS
  Filled 2016-06-05: qty 0.4

## 2016-06-05 MED ORDER — DICLOFENAC SODIUM 1 % TD GEL: 2.0000 g | Freq: Four times a day (QID) | TRANSDERMAL | Status: DC

## 2016-06-05 MED ORDER — CYCLOBENZAPRINE HCL 10 MG PO TABS
5.0000 mg | ORAL_TABLET | ORAL | Status: AC
  Administered 2016-06-05: 5 mg via ORAL
  Filled 2016-06-05: qty 2

## 2016-06-05 MED ORDER — CYCLOBENZAPRINE HCL 10 MG PO TABS: 10.0000 mg | ORAL_TABLET | Freq: Three times a day (TID) | ORAL | Status: DC | PRN

## 2016-06-05 MED ORDER — OXYCODONE HCL 5 MG PO TABS: 5.0000 mg | ORAL_TABLET | Freq: Four times a day (QID) | ORAL | Status: DC | PRN

## 2016-06-05 MED ORDER — DICLOFENAC SODIUM 1 % TD GEL
2.0000 g | Freq: Four times a day (QID) | TRANSDERMAL | Status: DC
  Administered 2016-06-05 (×4): 2 g via TOPICAL
  Filled 2016-06-05: qty 100

## 2016-06-05 NOTE — Consult Note (Signed)
[facility] Cardiology  CARDIOLOGY CONSULT NOTE  Patient ID: Stephen [name] MRN: [id number] DOB/AGE: 08-25-1966 50 y.o.  Admit date: 06/04/2016 Referring Physician Tressia Miners Primary Physician [place] Primary Cardiologist [facility] Reason for Consultation Congestive heart failure  HPI: Low back pain. 50 year old gentleman referred for evaluation of congestive heart failure and borderline elevated troponin. The patient has known history of nonischemic cardiomyopathy with LV ejection fraction 25%. The patient came to [facility] emergency room primarily for low back pain. Chest CT was negative for PE. Admission labs were notable for borderline elevated troponin of 0.17. The patient denies chest pain. He does have intermittent cough, and exertional dyspnea consistent with chronic systolic congestive heart failure.  Review of systems complete and found to be negative unless listed above     Past Medical History  Diagnosis Date  . Hypertension   . CHF (congestive heart failure) (HCC)     nonischemic cardiomyopathy, EF 25%    Past Surgical History  Procedure Laterality Date  . Knee surgery Right     Prescriptions prior to admission  Medication Sig Dispense Refill Last Dose  . albuterol (PROVENTIL HFA;VENTOLIN HFA) 108 (90 Base) MCG/ACT inhaler Inhale 2 puffs into the lungs every 4 (four) hours as needed for wheezing or shortness of breath. 1 Inhaler 0 Past Month at Unknown time  . aspirin 81 MG chewable tablet Chew 81 mg by mouth daily.   06/04/2016 at 0630  . carvedilol (COREG) 3.125 MG tablet Take 1.5625 mg by mouth 2 (two) times daily with a meal.    06/04/2016 at 0630  . furosemide (LASIX) 20 MG tablet Take 20 mg by mouth daily as needed for edema.   06/04/2016 at Unknown time  . Multiple Vitamin (MULTIVITAMIN WITH MINERALS) TABS tablet Take 1 tablet by mouth daily.   06/04/2016 at Unknown time  . valsartan (DIOVAN) 160 MG tablet Take 160 mg by mouth daily.   06/04/2016 at Unknown time  . varenicline (CHANTIX)  0.5 MG tablet Take 0.5 mg by mouth 2 (two) times daily.   Past Week at Unknown time  . ondansetron (ZOFRAN ODT) 4 MG disintegrating tablet Take 1 tablet (4 mg total) by mouth every 6 (six) hours as needed for nausea or vomiting. (Patient not taking: Reported on 06/04/2016) 20 tablet 0   . oxyCODONE-acetaminophen (ROXICET) 5-325 MG tablet Take 1 tablet by mouth every 6 (six) hours as needed for severe pain. (Patient not taking: Reported on 06/04/2016) 10 tablet 0   . traMADol (ULTRAM) 50 MG tablet Take 1 tablet (50 mg total) by mouth every 6 (six) hours as needed for moderate pain. (Patient not taking: Reported on 06/04/2016) 12 tablet 0 04/23/2016 at Unknown time   Social History   Social History  . Marital Status: Single    Spouse Name: N/A  . Number of Children: N/A  . Years of Education: N/A   Occupational History  . Not on file.   Social History Main Topics  . Smoking status: Former Smoker    Types: Cigarettes    Quit date: 02/20/2016  . Smokeless tobacco: Never Used     Comment: quit 2 months ago  . Alcohol Use: 0.0 oz/week    0 Standard drinks or equivalent per week     Comment: beer regularly  . Drug Use: No  . Sexual Activity: Not on file   Other Topics Concern  . Not on file   Social History Narrative   Independent at baseline    Family History  Problem  Relation Age of Onset  . Hypertension Father   . Hypertension Mother       Review of systems complete and found to be negative unless listed above      PHYSICAL EXAM  General: Well developed, well nourished, in no acute distress HEENT:  Normocephalic and atramatic Neck:  No JVD.  Lungs: Clear bilaterally to auscultation and percussion. Heart: HRRR . Normal S1 and S2 without gallops or murmurs.  Abdomen: Bowel sounds are positive, abdomen soft and non-tender  Msk:  Back normal, normal gait. Normal strength and tone for age. Extremities: No clubbing, cyanosis or edema.   Neuro: Alert and oriented X 3. Psych:   Good affect, responds appropriately  Labs:   Lab Results  Component Value Date   WBC 8.3 06/05/2016   HGB 15.0 06/05/2016   HCT 46.0 06/05/2016   MCV 86.6 06/05/2016   PLT 186 06/05/2016    Recent Labs Lab 06/05/16 0042  NA 139  K 3.6  CL 108  CO2 24  BUN 22*  CREATININE 1.03  CALCIUM 9.0  GLUCOSE 89   Lab Results  Component Value Date   TROPONINI 0.11* 06/05/2016   No results found for: CHOL No results found for: HDL No results found for: LDLCALC No results found for: TRIG No results found for: CHOLHDL No results found for: LDLDIRECT    Radiology: Dg Chest 2 View  06/04/2016  CLINICAL DATA:  RIGHT posterior rib in chest pain with coughing and deep breathing, had CHF in in December, history hypertension, former smoker EXAM: CHEST  2 VIEW COMPARISON:  04/01/2016 FINDINGS: Enlargement of cardiac silhouette with pulmonary vascular congestion. Tortuous thoracic aorta. Lungs clear. Question underlying emphysematous changes. No pleural effusion or pneumothorax. Bones unremarkable. IMPRESSION: Enlargement of cardiac silhouette with pulmonary vascular congestion. Question underlying COPD. No acute abnormalities. Electronically Signed   By: Lavonia Dana M.D.   On: 06/04/2016 12:51   Ct Angio Chest Pe W/cm &/or Wo Cm  06/04/2016  CLINICAL DATA:  Right chest and rib pain with cough. Shortness of breath. EXAM: CT ANGIOGRAPHY CHEST WITH CONTRAST TECHNIQUE: Multidetector CT imaging of the chest was performed using the standard protocol during bolus administration of intravenous contrast. Multiplanar CT image reconstructions and MIPs were obtained to evaluate the vascular anatomy. CONTRAST:  100 mL of Isovue 370 COMPARISON:  None. FINDINGS: The central airways are within normal limits. No pneumothorax. There is atelectasis in the lingula. There is a 5 mm nodule in the right lung base on series 6, image 86. No other suspicious nodules. No masses or other infiltrates. No adenopathy. No effusion.  Cardiomegaly is identified. No overt edema. The thoracic aorta is normal in caliber. No pulmonary emboli. Evaluation of the upper abdomen demonstrates mild increased attenuation of fat adjacent to the upper pole of left kidney. This was at least partially present in April of 2017 and may be chronic. Recommend clinical correlation. The upper abdomen is otherwise unremarkable. The bones are unremarkable. Review of the MIP images confirms the above findings. IMPRESSION: 1. No pulmonary emboli.  No acute abnormalities in the lungs. 2. 5 mm nodule in the right lung. See below for follow-up recommendations. 3. Mild increased attenuation in the fat around the upper pole of the left kidney was at least partially present in April 2017 and may be chronic. Recommend clinical correlation. No follow-up needed if patient is low-risk. Non-contrast chest CT can be considered in 12 months if patient is high-risk. This recommendation follows the consensus statement:  Guidelines for Management of Incidental Pulmonary Nodules Detected on CT Images:From the Fleischner Society 2017; published online before print (10.1148/radiol.IJ:2314499). Electronically Signed   By: Dorise Bullion III M.D   On: 06/04/2016 14:10    EKG: Sinus rhythm  ASSESSMENT AND PLAN:   1. Borderline elevated troponin, likely demand supply ischemia and not due to non-STEMI. Patient denies chest pain. ECG is nondiagnostic. 2. Acute on chronic systolic congestive heart failure 3. Low back pain  Recommendations  1. Agree with current therapy 2. DC therapeutic Lovenox 3. Continue diuresis 4. Consider outpatient Lexiscan Myoview if not done recently 5. Consider reconsult Dr. Mylinda Latina for elective ICD  Signed: Eran Mistry MD,PhD, [facility] 06/05/2016, 9:12 AM

## 2016-06-05 NOTE — Progress Notes (Signed)
*  PRELIMINARY RESULTS* Echocardiogram 2D Echocardiogram has been performed.  Laqueta Jean Hege 06/05/2016, 2:00 PM

## 2016-06-05 NOTE — Discharge Instructions (Signed)
Heart Failure Clinic appointment on June 26, 2016 at 9:00am with [name], [place]. Please call [phone] to reschedule.

## 2016-06-05 NOTE — Progress Notes (Signed)
Initial Heart Failure Clinic appointment scheduled for June 26, 2016 at 9:00am. Thank you.

## 2016-06-05 NOTE — Progress Notes (Signed)
Complaints of back pain continue;relief with prn meds.  No respiratory distress noted.  Blood pressure trending down towards normal limits.

## 2016-06-05 NOTE — Care Management (Signed)
No discharge needs identified by care team.

## 2016-06-06 ENCOUNTER — Inpatient Hospital Stay: Payer: Managed Care, Other (non HMO)

## 2016-06-06 MED ORDER — CYCLOBENZAPRINE HCL 10 MG PO TABS
10.0000 mg | ORAL_TABLET | ORAL | Status: AC
  Administered 2016-06-06: 10 mg via ORAL
  Filled 2016-06-06: qty 1

## 2016-06-06 NOTE — Discharge Summary (Signed)
[place] [facility] [place] NAME: Stephen [name]    MR#:  [id number]  DATE OF BIRTH:  01-02-1966  DATE OF ADMISSION:  06/04/2016 ADMITTING PHYSICIAN: [name], MD  DATE OF DISCHARGE: 06/06/2016  PRIMARY CARE PHYSICIAN: [name], MD    ADMISSION DIAGNOSIS:  Shortness of breath [R06.02] Elevated troponin [R79.89] Other back pain [M54.89] Acute on chronic congestive heart failure, unspecified congestive heart failure type ([place]) [I50.9]  DISCHARGE DIAGNOSIS:  Active Problems:   CHF (congestive heart failure) ([place])   SECONDARY DIAGNOSIS:   Past Medical History  Diagnosis Date  . Hypertension   . CHF (congestive heart failure) (HCC)     nonischemic cardiomyopathy, EF 25%    HOSPITAL COURSE:   Stephen [name] is a 50 y.o. male with a known history of Hypertension, nonischemic cardiomyopathy with ejection fraction of 25% diagnosed about 6 months ago presents to the hospital secondary to back pain and worsening dyspnea.  #1 elevated troponin- demand ischemia Appreciate cardiology consult -Troponins were stable  #2 CHF-Nonischemic cardiomyopathy with last known ejection fraction of 25-30%. No prior cardiac catheterization done. -Repeat echo confirms significantly decreased systolic function. EF is 20-25% -Patient given information by the cardiologist to follow-up for ICD placement -Mild exacerbation of CHF with trace pedal edema and dyspnea on admission.  -CT chest negative for any other findings. -Change Lasix to daily , a shunt was taking only as needed as outpatient. Strongly counseled about low salt diet and daily weight checks and outpatient follow-up with CHF clinic -Continue ARB. Coreg discontinued due to persistent bradycardia.  #3 hypertension-continue home medications. On ARB  #4 Tobacco use disorder- quit about 2 months ago. Continue Chantix  #5 right posterior shoulder/scapular pain-started on Flexeril  as needed  Being discharged today. Outpatient follow-up recommended  DISCHARGE CONDITIONS:   Guarded  CONSULTS OBTAINED:  Treatment Team:  Isaias Cowman, MD  DRUG ALLERGIES:  No Known Allergies  DISCHARGE MEDICATIONS:   Current Discharge Medication List    START taking these medications   Details  cyclobenzaprine (FLEXERIL) 10 MG tablet Take 1 tablet (10 mg total) by mouth 3 (three) times daily as needed for muscle spasms. Qty: 21 tablet, Refills: 0    diclofenac sodium (VOLTAREN) 1 % GEL Apply 2 g topically 4 (four) times daily. Apply to right scapular border tender spot for 1 week and stop Qty: 2 g, Refills: 0    oxyCODONE (OXY IR/ROXICODONE) 5 MG immediate release tablet Take 1-2 tablets (5-10 mg total) by mouth every 6 (six) hours as needed for moderate pain or severe pain. Qty: 20 tablet, Refills: 0      CONTINUE these medications which have CHANGED   Details  furosemide (LASIX) 20 MG tablet Take 1 tablet (20 mg total) by mouth daily. Qty: 30 tablet, Refills: 2      CONTINUE these medications which have NOT CHANGED   Details  albuterol (PROVENTIL HFA;VENTOLIN HFA) 108 (90 Base) MCG/ACT inhaler Inhale 2 puffs into the lungs every 4 (four) hours as needed for wheezing or shortness of breath. Qty: 1 Inhaler, Refills: 0    aspirin 81 MG chewable tablet Chew 81 mg by mouth daily.    Multiple Vitamin (MULTIVITAMIN WITH MINERALS) TABS tablet Take 1 tablet by mouth daily.    valsartan (DIOVAN) 160 MG tablet Take 160 mg by mouth daily.    varenicline (CHANTIX) 0.5 MG tablet Take 0.5 mg by mouth 2 (two) times daily.      STOP  taking these medications     carvedilol (COREG) 3.125 MG tablet      ondansetron (ZOFRAN ODT) 4 MG disintegrating tablet      oxyCODONE-acetaminophen (ROXICET) 5-325 MG tablet      traMADol (ULTRAM) 50 MG tablet          DISCHARGE INSTRUCTIONS:   1. Cardiology follow up in 1-2 weeks 2. CHF clinic follow-up in 1 week.  If you  experience worsening of your admission symptoms, develop shortness of breath, life threatening emergency, suicidal or homicidal thoughts you must seek medical attention immediately by calling 911 or calling your MD immediately  if symptoms less severe.  You Must read complete instructions/literature along with all the possible adverse reactions/side effects for all the Medicines you take and that have been prescribed to you. Take any new Medicines after you have completely understood and accept all the possible adverse reactions/side effects.   Please note  You were cared for by a hospitalist during your hospital stay. If you have any questions about your discharge medications or the care you received while you were in the hospital after you are discharged, you can call the unit and asked to speak with the hospitalist on call if the hospitalist that took care of you is not available. Once you are discharged, your primary care physician will handle any further medical issues. Please note that NO REFILLS for any discharge medications will be authorized once you are discharged, as it is imperative that you return to your primary care physician (or establish a relationship with a primary care physician if you do not have one) for your aftercare needs so that they can reassess your need for medications and monitor your lab values.    Today   CHIEF COMPLAINT:   Chief Complaint  Patient presents with  . Chest Pain     VITAL SIGNS:  Blood pressure 128/90, pulse 86, temperature 98.4 F (36.9 C), temperature source Oral, resp. rate 20, height 6\' 1"  (1.854 m), weight 104.327 kg (230 lb), SpO2 92 %.  I/O:   Intake/Output Summary (Last 24 hours) at 06/06/16 1513 Last data filed at 06/06/16 0803  Gross per 24 hour  Intake    240 ml  Output   1550 ml  Net  -1310 ml    PHYSICAL EXAMINATION:   Physical Exam  GENERAL: 50 y.o.-year-old patient lying in the bed with no acute distress.  EYES:  Pupils equal, round, reactive to light and accommodation. No scleral icterus. Extraocular muscles intact.  HEENT: Head atraumatic, normocephalic. Oropharynx and nasopharynx clear.  NECK: Supple, no jugular venous distention. No thyroid enlargement, no tenderness.  LUNGS: Normal breath sounds bilaterally, no wheezing, rhonchi or crepitation. No use of accessory muscles of respiration. Bibasilar crackles heard CARDIOVASCULAR: S1, S2 normal. No rubs, or gallops. 2/6 systolic murmur present ABDOMEN: Soft, nontender, nondistended. Bowel sounds present. No organomegaly or mass.  EXTREMITIES: No cyanosis, or clubbing. Trace pedal edema noted. NEUROLOGIC: Cranial nerves II through XII are intact. Muscle strength 5/5 in all extremities. Sensation intact. Gait not checked.  PSYCHIATRIC: The patient is alert and oriented x 3.  SKIN: No obvious rash, lesion, or ulcer.   DATA REVIEW:   CBC  Recent Labs Lab 06/05/16 0042  WBC 8.3  HGB 15.0  HCT 46.0  PLT 186    Chemistries   Recent Labs Lab 06/05/16 0042  NA 139  K 3.6  CL 108  CO2 24  GLUCOSE 89  BUN 22*  CREATININE 1.03  CALCIUM 9.0    Cardiac Enzymes  Recent Labs Lab 06/05/16 0633  TROPONINI 0.11*    Microbiology Results  No results found for this or any previous visit.  RADIOLOGY:  Dg Chest 2 View  06/06/2016  CLINICAL DATA:  CHF EXAM: CHEST  2 VIEW COMPARISON:  06/04/2016 chest radiograph. FINDINGS: Stable cardiomediastinal silhouette with mild cardiomegaly. No pneumothorax. No pleural effusion. There is cephalization of the pulmonary vasculature without overt pulmonary edema. No acute consolidative airspace disease. IMPRESSION: Stable mild cardiomegaly and cephalization of the pulmonary vasculature, without overt pulmonary edema. Electronically Signed   By: Ilona Sorrel M.D.   On: 06/06/2016 08:26    EKG:   Orders placed or performed during the hospital encounter of 06/04/16  . ED EKG within 10 minutes  . ED  EKG within 10 minutes      Management plans discussed with the patient, family and they are in agreement.  CODE STATUS:     Code Status Orders        Start     Ordered   06/04/16 1844  Full code   Continuous     06/04/16 1843    Code Status History    Date Active Date Inactive Code Status Order ID Comments User Context   This patient has a current code status but no historical code status.      TOTAL TIME TAKING CARE OF THIS PATIENT: 37 minutes.    [name] M.D on 06/06/2016 at 3:13 PM  Between 7am to 6pm - Pager - [phone]  After 6pm go to [email or web address] - password [email or web address] [facility]  [place] [facility]  Office  [phone]  CC: Primary care physician; [name], MD

## 2016-06-06 NOTE — Progress Notes (Signed)
     Stephen [name] was admitted to the [facility] on 06/04/2016 for an acute medical condition and is being Discharged on  06/06/2016 . He will need another 3 days for recovery and so advised to stay away from work until then. So please excuse him from work for the above mentioned Days. Should be able to return to work without any restrictions from 06/11/16 .  Call [name]  MD, [facility] at  [phone] with questions.  [name] M.D on 06/06/2016,at 1:17 PM  [facility] [address], [place] [address]

## 2016-06-06 NOTE — Progress Notes (Signed)
Discussed discharge instructions and medications with pt.  IV removed.  No questions at this time. Work note given. Pt transported home via car by family.  Clarise Cruz, RN

## 2016-06-06 NOTE — Progress Notes (Signed)
Rested without distress.  Complaints of headache relieved with prn Tylenol.  Reports back pain better since initiation of pain cream.

## 2016-06-21 ENCOUNTER — Other Ambulatory Visit: Payer: Self-pay

## 2016-06-21 ENCOUNTER — Emergency Department: Payer: Managed Care, Other (non HMO)

## 2016-06-21 ENCOUNTER — Encounter: Payer: Self-pay | Admitting: Urgent Care

## 2016-06-21 ENCOUNTER — Observation Stay
Admission: EM | Admit: 2016-06-21 | Discharge: 2016-06-22 | Disposition: A | Discharge status: Home / Self Care | Payer: Managed Care, Other (non HMO) | Attending: Internal Medicine | Admitting: Internal Medicine

## 2016-06-21 DIAGNOSIS — Z9889 Other specified postprocedural states: Secondary | ICD-10-CM | POA: Insufficient documentation

## 2016-06-21 DIAGNOSIS — R4781 Slurred speech: Secondary | ICD-10-CM

## 2016-06-21 DIAGNOSIS — Z87891 Personal history of nicotine dependence: Secondary | ICD-10-CM | POA: Insufficient documentation

## 2016-06-21 DIAGNOSIS — Z888 Allergy status to other drugs, medicaments and biological substances status: Secondary | ICD-10-CM | POA: Insufficient documentation

## 2016-06-21 DIAGNOSIS — G459 Transient cerebral ischemic attack, unspecified: Principal | ICD-10-CM | POA: Diagnosis present

## 2016-06-21 DIAGNOSIS — Z79899 Other long term (current) drug therapy: Secondary | ICD-10-CM | POA: Insufficient documentation

## 2016-06-21 DIAGNOSIS — I6523 Occlusion and stenosis of bilateral carotid arteries: Secondary | ICD-10-CM | POA: Diagnosis not present

## 2016-06-21 DIAGNOSIS — Z8249 Family history of ischemic heart disease and other diseases of the circulatory system: Secondary | ICD-10-CM | POA: Diagnosis not present

## 2016-06-21 DIAGNOSIS — I440 Atrioventricular block, first degree: Secondary | ICD-10-CM | POA: Diagnosis not present

## 2016-06-21 DIAGNOSIS — Z79891 Long term (current) use of opiate analgesic: Secondary | ICD-10-CM | POA: Insufficient documentation

## 2016-06-21 DIAGNOSIS — Z7982 Long term (current) use of aspirin: Secondary | ICD-10-CM | POA: Insufficient documentation

## 2016-06-21 DIAGNOSIS — I110 Hypertensive heart disease with heart failure: Secondary | ICD-10-CM | POA: Insufficient documentation

## 2016-06-21 DIAGNOSIS — Z791 Long term (current) use of non-steroidal anti-inflammatories (NSAID): Secondary | ICD-10-CM | POA: Insufficient documentation

## 2016-06-21 DIAGNOSIS — I129 Hypertensive chronic kidney disease with stage 1 through stage 4 chronic kidney disease, or unspecified chronic kidney disease: Secondary | ICD-10-CM

## 2016-06-21 HISTORY — DX: Transient cerebral ischemic attack, unspecified: G45.9

## 2016-06-21 LAB — CBC
HEMATOCRIT: 47.9 % (ref 40.0–52.0)
HEMOGLOBIN: 15.9 g/dL (ref 13.0–18.0)
MCH: 28.5 pg (ref 26.0–34.0)
MCHC: 33.2 g/dL (ref 32.0–36.0)
MCV: 85.7 fL (ref 80.0–100.0)
Platelets: 205 10*3/uL (ref 150–440)
RBC: 5.59 MIL/uL (ref 4.40–5.90)
RDW: 14.9 % — ABNORMAL HIGH (ref 11.5–14.5)
WBC: 9.7 10*3/uL (ref 3.8–10.6)

## 2016-06-21 LAB — PROTIME-INR
INR: 1.13
Prothrombin Time: 14.7 seconds (ref 11.4–15.0)

## 2016-06-21 LAB — APTT: APTT: 27 s (ref 24–36)

## 2016-06-21 NOTE — ED Notes (Signed)
Patient presents with c/o slurred speech that began approx x 2 hours ago. He reports that the symptom is intermittent and he is not currently experiencing it. No headache or weakness noted. Denies visual changes. Ambulatory to triage with NAD noted. Patient grossly NI in triage; no pronator drift; face symmetrical; BUE grip strength equal; pedal pushes normal.

## 2016-06-22 ENCOUNTER — Observation Stay
Admit: 2016-06-22 | Discharge: 2016-06-22 | Disposition: A | Discharge status: Home / Self Care | Payer: Managed Care, Other (non HMO) | Attending: Internal Medicine | Admitting: Internal Medicine

## 2016-06-22 ENCOUNTER — Observation Stay: Payer: Managed Care, Other (non HMO)

## 2016-06-22 DIAGNOSIS — I639 Cerebral infarction, unspecified: Secondary | ICD-10-CM

## 2016-06-22 DIAGNOSIS — I129 Hypertensive chronic kidney disease with stage 1 through stage 4 chronic kidney disease, or unspecified chronic kidney disease: Secondary | ICD-10-CM

## 2016-06-22 DIAGNOSIS — G459 Transient cerebral ischemic attack, unspecified: Secondary | ICD-10-CM | POA: Diagnosis present

## 2016-06-22 LAB — URINALYSIS COMPLETE WITH MICROSCOPIC (ARMC ONLY)
BACTERIA UA: NONE SEEN
Bilirubin Urine: NEGATIVE
Glucose, UA: NEGATIVE mg/dL
HGB URINE DIPSTICK: NEGATIVE
Ketones, ur: NEGATIVE mg/dL
Leukocytes, UA: NEGATIVE
NITRITE: NEGATIVE
PH: 6 (ref 5.0–8.0)
PROTEIN: 30 mg/dL — AB
SPECIFIC GRAVITY, URINE: 1.014 (ref 1.005–1.030)

## 2016-06-22 LAB — BASIC METABOLIC PANEL
ANION GAP: 7 (ref 5–15)
Anion gap: 9 (ref 5–15)
BUN: 24 mg/dL — ABNORMAL HIGH (ref 6–20)
BUN: 21 mg/dL — AB (ref 6–20)
CALCIUM: 9.2 mg/dL (ref 8.9–10.3)
CALCIUM: 9 mg/dL (ref 8.9–10.3)
CHLORIDE: 107 mmol/L (ref 101–111)
CO2: 23 mmol/L (ref 22–32)
CO2: 21 mmol/L — ABNORMAL LOW (ref 22–32)
CREATININE: 1.03 mg/dL (ref 0.61–1.24)
Chloride: 107 mmol/L (ref 101–111)
Creatinine, Ser: 1.44 mg/dL — ABNORMAL HIGH (ref 0.61–1.24)
GFR calc Af Amer: >60 mL/min (ref >60)
GFR calc non Af Amer: 55 mL/min — ABNORMAL LOW (ref >60)
GLUCOSE: 105 mg/dL — AB (ref 65–99)
Glucose, Bld: 110 mg/dL — ABNORMAL HIGH (ref 65–99)
Potassium: 4.2 mmol/L (ref 3.5–5.1)
Potassium: 3.8 mmol/L (ref 3.5–5.1)
SODIUM: 137 mmol/L (ref 135–145)
Sodium: 137 mmol/L (ref 135–145)

## 2016-06-22 LAB — LIPID PANEL
CHOLESTEROL: 173 mg/dL (ref 0–200)
HDL: 36 mg/dL — AB (ref >40)
LDL Cholesterol: 124 mg/dL — ABNORMAL HIGH (ref 0–99)
TRIGLYCERIDES: 65 mg/dL (ref <150)
Total CHOL/HDL Ratio: 4.8 RATIO
VLDL: 13 mg/dL (ref 0–40)

## 2016-06-22 LAB — CBC
HCT: 46.2 % (ref 40.0–52.0)
Hemoglobin: 15.1 g/dL (ref 13.0–18.0)
MCH: 28.2 pg (ref 26.0–34.0)
MCHC: 32.6 g/dL (ref 32.0–36.0)
MCV: 86.5 fL (ref 80.0–100.0)
PLATELETS: 199 10*3/uL (ref 150–440)
RBC: 5.34 MIL/uL (ref 4.40–5.90)
RDW: 15.2 % — AB (ref 11.5–14.5)
WBC: 7.8 10*3/uL (ref 3.8–10.6)

## 2016-06-22 LAB — TROPONIN I
Troponin I: 0.04 ng/mL — ABNORMAL HIGH (ref <0.031)
Troponin I: 0.04 ng/mL — ABNORMAL HIGH (ref <0.031)

## 2016-06-22 LAB — MAGNESIUM: MAGNESIUM: 2 mg/dL (ref 1.7–2.4)

## 2016-06-22 MED ORDER — OXYCODONE HCL 5 MG PO TABS: 5.0000 mg | ORAL_TABLET | Freq: Four times a day (QID) | ORAL | Status: DC | PRN

## 2016-06-22 MED ORDER — ONDANSETRON HCL 4 MG/2ML IJ SOLN: 4.0000 mg | Freq: Four times a day (QID) | INTRAMUSCULAR | Status: DC | PRN

## 2016-06-22 MED ORDER — FUROSEMIDE 20 MG PO TABS
20.0000 mg | ORAL_TABLET | Freq: Every day | ORAL | Status: DC
  Administered 2016-06-22: 20 mg via ORAL
  Filled 2016-06-22: qty 1

## 2016-06-22 MED ORDER — ACETAMINOPHEN 325 MG PO TABS
650.0000 mg | ORAL_TABLET | Freq: Four times a day (QID) | ORAL | Status: DC | PRN
  Administered 2016-06-22: 650 mg via ORAL
  Filled 2016-06-22: qty 2

## 2016-06-22 MED ORDER — SENNOSIDES-DOCUSATE SODIUM 8.6-50 MG PO TABS: 1.0000 | ORAL_TABLET | Freq: Every evening | ORAL | Status: DC | PRN

## 2016-06-22 MED ORDER — CYCLOBENZAPRINE HCL 10 MG PO TABS: 10.0000 mg | ORAL_TABLET | Freq: Three times a day (TID) | ORAL | Status: DC | PRN

## 2016-06-22 MED ORDER — ENOXAPARIN SODIUM 40 MG/0.4ML ~~LOC~~ SOLN
40.0000 mg | Freq: Every day | SUBCUTANEOUS | Status: DC
  Administered 2016-06-22: 40 mg via SUBCUTANEOUS
  Filled 2016-06-22: qty 0.4

## 2016-06-22 MED ORDER — ASPIRIN-DIPYRIDAMOLE ER 25-200 MG PO CP12
1.0000 | ORAL_CAPSULE | Freq: Two times a day (BID) | ORAL | Status: DC
  Administered 2016-06-22: 1 via ORAL
  Filled 2016-06-22: qty 1

## 2016-06-22 MED ORDER — ONDANSETRON HCL 4 MG PO TABS: 4.0000 mg | ORAL_TABLET | Freq: Four times a day (QID) | ORAL | Status: DC | PRN

## 2016-06-22 MED ORDER — ATORVASTATIN CALCIUM 20 MG PO TABS
40.0000 mg | ORAL_TABLET | Freq: Every day | ORAL | Status: DC
  Administered 2016-06-22: 40 mg via ORAL
  Filled 2016-06-22: qty 2

## 2016-06-22 MED ORDER — SODIUM CHLORIDE 0.9% FLUSH
3.0000 mL | Freq: Two times a day (BID) | INTRAVENOUS | Status: DC
  Administered 2016-06-22 (×2): 3 mL via INTRAVENOUS

## 2016-06-22 MED ORDER — ATORVASTATIN CALCIUM 40 MG PO TABS: 40.0000 mg | ORAL_TABLET | Freq: Every day | ORAL | Status: DC

## 2016-06-22 MED ORDER — CARVEDILOL 6.25 MG PO TABS
6.2500 mg | ORAL_TABLET | Freq: Two times a day (BID) | ORAL | Status: DC
  Administered 2016-06-22 (×2): 6.25 mg via ORAL
  Filled 2016-06-22 (×2): qty 1

## 2016-06-22 MED ORDER — VARENICLINE TARTRATE 0.5 MG PO TABS
0.5000 mg | ORAL_TABLET | Freq: Two times a day (BID) | ORAL | Status: DC
  Administered 2016-06-22: 0.5 mg via ORAL
  Filled 2016-06-22 (×2): qty 1

## 2016-06-22 MED ORDER — IRBESARTAN 75 MG PO TABS
75.0000 mg | ORAL_TABLET | Freq: Every day | ORAL | Status: DC
  Administered 2016-06-22: 75 mg via ORAL
  Filled 2016-06-22: qty 1

## 2016-06-22 MED ORDER — CLOPIDOGREL BISULFATE 75 MG PO TABS: 75.0000 mg | ORAL_TABLET | Freq: Every day | ORAL | Status: DC

## 2016-06-22 MED ORDER — ACETAMINOPHEN 650 MG RE SUPP: 650.0000 mg | Freq: Four times a day (QID) | RECTAL | Status: DC | PRN

## 2016-06-22 NOTE — Consult Note (Signed)
Referring Physician: Tressia Miners    Chief Complaint: Slurred speech  HPI: Stephen [name] is an 50 y.o. male who reports that last evening he was going to the store and noted when he got there that he was unable to remember why he came or what he was doing.  Was with a friend later and the both noted that he had slurred speech. His friend brought him to the ED for further evaluation.  Patient reports that symptoms resolved after about 2 hours.  Initial NIHSS of 0.    Date last known well: 06/21/2016 Time last known well: Time: 21:00 tPA Given: No: Resolution of symptoms  Past Medical History  Diagnosis Date  . Hypertension   . CHF (congestive heart failure) (HCC)     nonischemic cardiomyopathy, EF 25%    Past Surgical History  Procedure Laterality Date  . Knee surgery Right     Family History  Problem Relation Age of Onset  . Hypertension Father   . Hypertension Mother    Social History:  reports that he quit smoking about 4 months ago. His smoking use included Cigarettes. He has never used smokeless tobacco. He reports that he drinks alcohol. He reports that he does not use illicit drugs.  Allergies:  Allergies  Allergen Reactions  . Lisinopril Cough    Medications:  I have reviewed the patient's current medications. Prior to Admission:  Prescriptions prior to admission  Medication Sig Dispense Refill Last Dose  . albuterol (PROVENTIL HFA;VENTOLIN HFA) 108 (90 Base) MCG/ACT inhaler Inhale 2 puffs into the lungs every 4 (four) hours as needed for wheezing or shortness of breath. 1 Inhaler 0 prn at prn  . aspirin 81 MG chewable tablet Chew 81 mg by mouth daily.   06/21/2016 at 0800  . carvedilol (COREG) 6.25 MG tablet Take 6.25 mg by mouth 2 (two) times daily.   06/21/2016 at Unknown time  . cyclobenzaprine (FLEXERIL) 10 MG tablet Take 1 tablet (10 mg total) by mouth 3 (three) times daily as needed for muscle spasms. 21 tablet 0 prn at prn  . diclofenac sodium (VOLTAREN) 1 %  GEL Apply 2 g topically 4 (four) times daily. Apply to right scapular border tender spot for 1 week and stop 2 g 0 Past Month at Unknown time  . furosemide (LASIX) 20 MG tablet Take 1 tablet (20 mg total) by mouth daily. 30 tablet 2 06/21/2016 at Unknown time  . Multiple Vitamin (MULTIVITAMIN WITH MINERALS) TABS tablet Take 1 tablet by mouth daily.   06/21/2016 at Unknown time  . oxyCODONE (OXY IR/ROXICODONE) 5 MG immediate release tablet Take 1-2 tablets (5-10 mg total) by mouth every 6 (six) hours as needed for moderate pain or severe pain. 20 tablet 0 06/21/2016 at Unknown time  . valsartan (DIOVAN) 320 MG tablet Take 320 mg by mouth daily.   06/21/2016 at Unknown time  . varenicline (CHANTIX) 0.5 MG tablet Take 0.5 mg by mouth 2 (two) times daily.   06/21/2016 at Unknown time   Scheduled: . carvedilol  6.25 mg Oral BID WC  . dipyridamole-aspirin  1 capsule Oral BID  . enoxaparin (LOVENOX) injection  40 mg Subcutaneous Q0600  . furosemide  20 mg Oral Daily  . irbesartan  75 mg Oral Daily  . sodium chloride flush  3 mL Intravenous Q12H  . varenicline  0.5 mg Oral BID    ROS: History obtained from the patient  General ROS: negative for - chills, fatigue, fever, night sweats, weight  gain or weight loss Psychological ROS: negative for - behavioral disorder, hallucinations, memory difficulties, mood swings or suicidal ideation Ophthalmic ROS: negative for - blurry vision, double vision, eye pain or loss of vision ENT ROS: negative for - epistaxis, nasal discharge, oral lesions, sore throat, tinnitus or vertigo Allergy and Immunology ROS: negative for - hives or itchy/watery eyes Hematological and Lymphatic ROS: negative for - bleeding problems, bruising or swollen lymph nodes Endocrine ROS: negative for - galactorrhea, hair pattern changes, polydipsia/polyuria or temperature intolerance Respiratory ROS: negative for - cough, hemoptysis, shortness of breath or wheezing Cardiovascular ROS:  negative for - chest pain, dyspnea on exertion, edema or irregular heartbeat Gastrointestinal ROS: negative for - abdominal pain, diarrhea, hematemesis, nausea/vomiting or stool incontinence Genito-Urinary ROS: negative for - dysuria, hematuria, incontinence or urinary frequency/urgency Musculoskeletal ROS: negative for - joint swelling or muscular weakness Neurological ROS: as noted in HPI Dermatological ROS: negative for rash and skin lesion changes  Physical Examination: Blood pressure 140/94, pulse 80, temperature 98 F (36.7 C), temperature source Oral, resp. rate 20, height 6\' 2"  (1.88 m), weight 103.42 kg (228 lb), SpO2 92 %.  HEENT-  Normocephalic, no lesions, without obvious abnormality.  Normal external eye and conjunctiva.  Normal TM's bilaterally.  Normal auditory canals and external ears. Normal external nose, mucus membranes and septum.  Normal pharynx. Cardiovascular- S1, S2 normal, pulses palpable throughout   Lungs- chest clear, no wheezing, rales, normal symmetric air entry Abdomen- soft, non-tender; bowel sounds normal; no masses,  no organomegaly Extremities- no edema Lymph-no adenopathy palpable Musculoskeletal-no joint tenderness, deformity or swelling Skin-warm and dry, no hyperpigmentation, vitiligo, or suspicious lesions  Neurological Examination Mental Status: Alert, oriented, thought content appropriate.  Speech fluent without evidence of aphasia.  Able to follow 3 step commands without difficulty. Cranial Nerves: II: Discs flat bilaterally; Visual fields grossly normal, pupils equal, round, reactive to light and accommodation III,IV, VI: ptosis not present, extra-ocular motions intact bilaterally V,VII: smile symmetric, facial light touch sensation normal bilaterally VIII: hearing normal bilaterally IX,X: gag reflex present XI: bilateral shoulder shrug XII: midline tongue extension Motor: Right : Upper extremity   5/5    Left:     Upper extremity    5/5  Lower extremity   5/5     Lower extremity   5/5 Tone and bulk:normal tone throughout; no atrophy noted Sensory: Pinprick and light touch intact throughout, bilaterally Deep Tendon Reflexes: 1+ and symmetric with absent AJ's bilaterally Plantars: Right: downgoing   Left: downgoing Cerebellar: Normal finger-to-nose and normal heel-to-shin testing bilaterally Gait: not tested due to safety concerns    Laboratory Studies:  Basic Metabolic Panel:  Recent Labs Lab 06/21/16 2326 06/22/16 0755  NA 137 137  K 4.2 3.8  CL 107 107  CO2 23 21*  GLUCOSE 105* 110*  BUN 24* 21*  CREATININE 1.44* 1.03  CALCIUM 9.2 9.0  MG 2.0  --     Liver Function Tests: No results for input(s): AST, ALT, ALKPHOS, BILITOT, PROT, ALBUMIN in the last 168 hours. No results for input(s): LIPASE, AMYLASE in the last 168 hours. No results for input(s): AMMONIA in the last 168 hours.  CBC:  Recent Labs Lab 06/21/16 2326 06/22/16 0755  WBC 9.7 7.8  HGB 15.9 15.1  HCT 47.9 46.2  MCV 85.7 86.5  PLT 205 199    Cardiac Enzymes:  Recent Labs Lab 06/21/16 2326 06/22/16 0253  TROPONINI 0.04* 0.04*    BNP: Invalid input(s): POCBNP  CBG: No results  for input(s): GLUCAP in the last 168 hours.  Microbiology: No results found for this or any previous visit.  Coagulation Studies:  Recent Labs  06/21/16 2326  LABPROT 14.7  INR 1.13    Urinalysis:  Recent Labs Lab 06/21/16 2326  COLORURINE YELLOW*  LABSPEC 1.014  PHURINE 6.0  GLUCOSEU NEGATIVE  HGBUR NEGATIVE  BILIRUBINUR NEGATIVE  KETONESUR NEGATIVE  PROTEINUR 30*  NITRITE NEGATIVE  LEUKOCYTESUR NEGATIVE    Lipid Panel:    Component Value Date/Time   CHOL 173 06/22/2016 0755   TRIG 65 06/22/2016 0755   HDL 36* 06/22/2016 0755   CHOLHDL 4.8 06/22/2016 0755   VLDL 13 06/22/2016 0755   LDLCALC 124* 06/22/2016 0755    HgbA1C: No results found for: HGBA1C  Urine Drug Screen:  No results found for: LABOPIA,  COCAINSCRNUR, LABBENZ, AMPHETMU, THCU, LABBARB  Alcohol Level: No results for input(s): ETH in the last 168 hours.  Other results: EKG: sinus rhythm at 88 bpm, 1st degree AV block with frequent PVC's.  Imaging: Ct Head Wo Contrast  06/21/2016  CLINICAL DATA:  slurred speech that began approx x 2 hours ago. He reports that the symptom is intermittent and he is not currently experiencing it. No headache or weakness noted. Denies visual changes. Ambulatory to triage with NAD noted. Patient grossly NI in triage; no pronator drift; face symmetrical; BUE grip strength equal; pedal pushes normal. EXAM: CT HEAD WITHOUT CONTRAST TECHNIQUE: Contiguous axial images were obtained from the base of the skull through the vertex without intravenous contrast. COMPARISON:  06/21/2008 FINDINGS: Brain: No evidence of acute infarction, hemorrhage, extra-axial collection, ventriculomegaly, or mass effect. Vascular: No hyperdense vessel or unexpected calcification. Skull: Negative for fracture or focal lesion. Sinuses/Orbits: No acute findings. Other: None. IMPRESSION: 1. Negative for bleed or other acute intracranial process. Critical Value/emergent results were called by telephone at the time of interpretation on 06/21/2016 at 11:25 pm to Dr. Marjean Donna , who verbally acknowledged these results. Electronically Signed   By: Lucrezia Europe M.D.   On: 06/21/2016 23:27   Mr Brain Wo Contrast  06/22/2016  CLINICAL DATA:  TIA.  Slurred speech. EXAM: MRI HEAD WITHOUT CONTRAST TECHNIQUE: Multiplanar, multiecho pulse sequences of the brain and surrounding structures were obtained without intravenous contrast. COMPARISON:  CT head 06/21/2016 FINDINGS: Small area of cortical infarction in the high left frontal lobe. Tiny area of acute infarct in the left parietal cortex. No other areas of acute infarct Ventricle size normal.  Cerebral volume normal. Several small white matter hyperintensities bilaterally. Most consistent with minimal  chronic microvascular ischemia Negative for hemorrhage or fluid collection Negative for mass or edema.  No shift of the midline structures Paranasal sinuses clear.  Normal orbit and pituitary. IMPRESSION: Small areas of acute infarction in the left frontal cortex over the convexity and in the left parietal cortex. Electronically Signed   By: Franchot Gallo M.D.   On: 06/22/2016 10:50   US Carotid Bilateral  06/22/2016  CLINICAL DATA:  TIA.  History of hypertension. EXAM: BILATERAL CAROTID DUPLEX ULTRASOUND TECHNIQUE: Pearline Cables scale imaging, color Doppler and duplex ultrasound were performed of bilateral carotid and vertebral arteries in the neck. COMPARISON:  None. FINDINGS: Criteria: Quantification of carotid stenosis is based on velocity parameters that correlate the residual internal carotid diameter with NASCET-based stenosis levels, using the diameter of the distal internal carotid lumen as the denominator for stenosis measurement. The following velocity measurements were obtained: RIGHT ICA:  48 cm/sec CCA:  47 cm/sec SYSTOLIC  ICA/CCA RATIO:  1.0 DIASTOLIC ICA/CCA RATIO:  2.0 ECA:  52 cm/sec LEFT ICA:  38 cm/sec CCA:  55 cm/sec SYSTOLIC ICA/CCA RATIO:  0.7 DIASTOLIC ICA/CCA RATIO:  1.2 ECA:  59 cm/sec RIGHT CAROTID ARTERY: Mild intimal thickening in the right common carotid artery. No significant plaque. No evidence for stenosis. RIGHT VERTEBRAL ARTERY: Antegrade flow and normal waveform in the right vertebral artery. LEFT CAROTID ARTERY: Small amount of echogenic plaque at the left carotid bulb. Mild intimal thickening in the left common carotid artery. No significant stenosis. LEFT VERTEBRAL ARTERY: Antegrade flow and normal waveform in the left vertebral artery. IMPRESSION: Minimal atherosclerotic disease in the carotid arteries. No significant carotid artery stenosis. Estimated degree of stenosis in the internal carotid arteries is less than 50% bilaterally. Patent vertebral arteries. Electronically Signed    By: Markus Daft M.D.   On: 06/22/2016 10:53    Assessment: 50 y.o. male presenting with slurred speech and confusion that resolved.  MRI of the brain personally reviewed and shows acute small left MCA territory infarcts.  Carotid dopplers show no evidence of hemodynamically significant stenosis.  Echocardiogram performed on 6/6 shows no cardiac source of emboli with an EF of 20-25%.  LDL 124.  Stroke Risk Factors - cardiomyopathy  Plan: 1. PT consult, OT consult, Speech consult 2. Echocardiogram to be repeated even though done earlier this month to rule out a cardiac source of emboli. 3. Prophylactic therapy-Patient on ASA at home.  Has been changed to Aggrenox which should be continued.   4. Telemetry monitoring 5. Frequent neuro checks  Case discussed with Dr. Samuella Cota, MD Neurology [phone] 06/22/2016, 12:38 PM

## 2016-06-22 NOTE — ED Notes (Signed)
Pt transported to room 260

## 2016-06-22 NOTE — ED Notes (Signed)
Stephen [name], Sec called to give heads up for transport

## 2016-06-22 NOTE — ED Notes (Signed)
Informed RN of a ready bed at 3:44am

## 2016-06-22 NOTE — Care Management (Signed)
Presents from home with sx concerning for TIA.  MRI is positive for small cva.  Patient does not have any deficits. Patient placed in observation and it is anticipated patient will discharge today.   Independent in all adls, denies issues accessing medical care, obtaining medications or with transportation.  Current with PCP.  No discharge needs identified at present by care manager or members of care team

## 2016-06-22 NOTE — H&P (Signed)
PCP:   Stephen Daisy, MD   Chief Complaint:  Slurred speech  HPI: This is a 50 year old gentleman who was visiting a friend when she noted he had slurred speech. He also reports murmur issues. He states he normally loves playing the lotto and he couldn't remember if he had played recently or won. He denies any localized weakness, facial droops, headache, blurred vision, nausea or vomiting. He states he's never had this before. The symptoms resolved within less than 2 hours. By time he presented to the ER, the symptoms had resolved. He does take a baby aspirin daily.  Review of Systems:  The patient denies anorexia, fever, weight loss,, vision loss, decreased hearing, hoarseness, chest pain, syncope, dyspnea on exertion, peripheral edema, balance deficits, hemoptysis, abdominal pain, melena, hematochezia, severe indigestion/heartburn, hematuria, incontinence, genital sores, muscle weakness, suspicious skin lesions, transient blindness, difficulty walking, slurred speech, depression, unusual weight change, abnormal bleeding, enlarged lymph nodes, angioedema, and breast masses.  Past Medical History: Past Medical History  Diagnosis Date  . Hypertension   . CHF (congestive heart failure) (HCC)     nonischemic cardiomyopathy, EF 25%   Past Surgical History  Procedure Laterality Date  . Knee surgery Right     Medications: Prior to Admission medications   Medication Sig Start Date End Date Taking? Authorizing Provider  albuterol (PROVENTIL HFA;VENTOLIN HFA) 108 (90 Base) MCG/ACT inhaler Inhale 2 puffs into the lungs every 4 (four) hours as needed for wheezing or shortness of breath. 12/28/15  Yes Stephen Land, NP  aspirin 81 MG chewable tablet Chew 81 mg by mouth daily.   Yes Historical Provider, MD  carvedilol (COREG) 6.25 MG tablet Take 6.25 mg by mouth 2 (two) times daily. 06/20/16  Yes Historical Provider, MD  cyclobenzaprine (FLEXERIL) 10 MG tablet Take 1 tablet (10 mg total) by mouth  3 (three) times daily as needed for muscle spasms. 06/05/16  Yes Stephen Lighter, MD  diclofenac sodium (VOLTAREN) 1 % GEL Apply 2 g topically 4 (four) times daily. Apply to right scapular border tender spot for 1 week and stop 06/05/16  Yes Stephen Lighter, MD  furosemide (LASIX) 20 MG tablet Take 1 tablet (20 mg total) by mouth daily. 06/05/16  Yes Stephen Lighter, MD  Multiple Vitamin (MULTIVITAMIN WITH MINERALS) TABS tablet Take 1 tablet by mouth daily.   Yes Historical Provider, MD  oxyCODONE (OXY IR/ROXICODONE) 5 MG immediate release tablet Take 1-2 tablets (5-10 mg total) by mouth every 6 (six) hours as needed for moderate pain or severe pain. 06/05/16  Yes Stephen Lighter, MD  valsartan (DIOVAN) 320 MG tablet Take 320 mg by mouth daily. 06/20/16  Yes Historical Provider, MD  varenicline (CHANTIX) 0.5 MG tablet Take 0.5 mg by mouth 2 (two) times daily.   Yes Historical Provider, MD    Allergies:   Allergies  Allergen Reactions  . Lisinopril Cough    Social History:  reports that he quit smoking about 4 months ago. His smoking use included Cigarettes. He has never used smokeless tobacco. He reports that he drinks alcohol. He reports that he does not use illicit drugs.  Family History: Family History  Problem Relation Age of Onset  . Hypertension Father   . Hypertension Mother     Physical Exam: Filed Vitals:   06/21/16 2303 06/21/16 2330 06/22/16 0035 06/22/16 0130  BP:  113/93 138/109 132/75  Pulse: 88 85 80 79  Temp:      TempSrc:      Resp:  36 24  Height:      Weight:      SpO2:  94% 98% 88%    General:  Alert and oriented times three, well developed and nourished, no acute distress Eyes: PERRLA, pink conjunctiva, no scleral icterus ENT: Moist oral mucosa, neck supple, no thyromegaly Lungs: clear to ascultation, no wheeze, no crackles, no use of accessory muscles Cardiovascular: regular rate and rhythm, no regurgitation, no gallops, no murmurs. No carotid bruits,  no JVD Abdomen: soft, positive BS, non-tender, non-distended, no organomegaly, not an acute abdomen GU: not examined Neuro: CN II - XII grossly intact, sensation intact Musculoskeletal: strength 5/5 all extremities, no clubbing, cyanosis or edema Skin: no rash, no subcutaneous crepitation, no decubitus Psych: appropriate patient   Labs on Admission:   Recent Labs  06/21/16 2326  NA 137  K 4.2  CL 107  CO2 23  GLUCOSE 105*  BUN 24*  CREATININE 1.44*  CALCIUM 9.2  MG 2.0   No results for input(s): AST, ALT, ALKPHOS, BILITOT, PROT, ALBUMIN in the last 72 hours. No results for input(s): LIPASE, AMYLASE in the last 72 hours.  Recent Labs  06/21/16 2326  WBC 9.7  HGB 15.9  HCT 47.9  MCV 85.7  PLT 205    Recent Labs  06/21/16 2326  TROPONINI 0.04*   Invalid input(s): POCBNP No results for input(s): DDIMER in the last 72 hours. No results for input(s): HGBA1C in the last 72 hours. No results for input(s): CHOL, HDL, LDLCALC, TRIG, CHOLHDL, LDLDIRECT in the last 72 hours. No results for input(s): TSH, T4TOTAL, T3FREE, THYROIDAB in the last 72 hours.  Invalid input(s): FREET3 No results for input(s): VITAMINB12, FOLATE, FERRITIN, TIBC, IRON, RETICCTPCT in the last 72 hours.  Micro Results: No results found for this or any previous visit (from the past 240 hour(s)). Result status: Final result      *[name], [name] [address]  [phone]  ------------------------------------------------------------------- Transthoracic Echocardiography  Patient: Stephen [name], Stephen [name] MR #: [id number] Study Date: 06/05/2016 Gender: M Age: 70 Height: 185.4 cm Weight: 104.3 kg BSA: 2.34 m^2 Pt. Status: Room: 253A  SONOGRAPHER [facility] RDCS ADMITTING Stephen [name] ATTENDING  [place], Radhika [place], Radhika REFERRING [place], [place] PERFORMING [place], [facility]  cc:  ------------------------------------------------------------------- LV EF: 20% - 25%  ------------------------------------------------------------------- Indications: CHF 428.0.  ------------------------------------------------------------------- History: PMH: Congestive heart failure. Risk factors: Hypertension.  ------------------------------------------------------------------- Study Conclusions  - Left ventricle: The cavity size was severely dilated. Systolic  function was severely reduced. The estimated ejection fraction  was in the range of 20% to 25%. - Aortic valve: Valve area (Vmax): 2.2 cm^2. - Mitral valve: There was mild regurgitation. - Left atrium: The atrium was mildly dilated. - Right atrium: The atrium was mildly dilated.  Transthoracic echocardiography. M-mode, complete 2D, spectral Doppler, and color Doppler. Birthdate: Patient birthdate: 12/22/1966. Age: Patient is 50 yr old. Sex: Gender: male. BMI: 30.4 kg/m^2. Blood pressure: 110/87 Patient status: Inpatient. Study date: Study date: 06/05/2016. Study time: 01:31 PM.  -------------------------------------------------------------------  ------------------------------------------------------------------- Left ventricle: The cavity size was severely dilated. Systolic function was severely reduced. The estimated ejection fraction was in the range of 20% to 25%.  ------------------------------------------------------------------- Aortic valve: Structurally normal valve. Cusp separation was normal. Doppler: Transvalvular velocity was within the normal range. There was no stenosis. There was no regurgitation. Peak velocity ratio of LVOT to aortic valve: 0.58. Valve area (Vmax): 2.2 cm^2. Indexed valve area (Vmax): 0.94 cm^2/m^2.  Peak gradient (S): 3  mm Hg.  ------------------------------------------------------------------- Aorta: The aorta was normal, not dilated, and non-diseased.  ------------------------------------------------------------------- Mitral valve: Doppler: There was mild regurgitation. Peak gradient (D): 3 mm Hg.  ------------------------------------------------------------------- Left atrium: The atrium was mildly dilated.  ------------------------------------------------------------------- Tricuspid valve: Doppler: There was mild regurgitation.  ------------------------------------------------------------------- Right atrium: The atrium was mildly dilated.  ------------------------------------------------------------------- Pericardium: The pericardium was normal in appearance. There was no pericardial effusion.  ------------------------------------------------------------------- Post procedure conclusions Ascending Aorta:  - The aorta was normal, not dilated, and non-diseased.  ------------------------------------------------------------------- Measurements  Left ventricle Value Reference LV ID, ED, PLAX chordal (H) 69.1 mm 43 - 52 LV ID, ES, PLAX chordal (H) 62.1 mm 23 - 38 LV fx shortening, PLAX chordal (L) 10 % >=29 LV PW thickness, ED 17.8 mm --------- IVS/LV PW ratio, ED 0.66 <=1.3 LV ejection fraction, 1-p A4C 15 % --------- LV end-diastolic volume, 2-p 0000000 ml --------- LV end-systolic volume, 2-p [id number] ml --------- LV ejection fraction, 2-p 21 % --------- Stroke volume, 2-p 51 ml --------- LV end-diastolic volume/bsa, 2-p 123456 ml/m^2 --------- LV  end-systolic volume/bsa, 2-p 82 ml/m^2 --------- Stroke volume/bsa, 2-p 21.8 ml/m^2 ---------  Ventricular septum Value Reference IVS thickness, ED 11.7 mm ---------  LVOT Value Reference LVOT ID, S 22 mm --------- LVOT area 3.8 cm^2 --------- LVOT peak velocity, S 50.4 cm/s ---------  Aortic valve Value Reference Aortic valve peak velocity, S 86.9 cm/s --------- Aortic peak gradient, S 3 mm Hg --------- Velocity ratio, peak, LVOT/AV 0.58 --------- Aortic valve area, peak velocity 2.2 cm^2 --------- Aortic valve area/bsa, peak 0.94 cm^2/m^2 --------- velocity  Aorta Value Reference Aortic root ID, ED 35 mm ---------  Left atrium Value Reference LA ID, A-P, ES 52 mm --------- LA ID/bsa, A-P (H) 2.22 cm/m^2 <=2.2 LA volume, S 96.9 ml --------- LA volume/bsa, S 41.4 ml/m^2 --------- LA volume, ES, 1-p A4C 74.6 ml --------- LA volume/bsa, ES, 1-p A4C 31.9 ml/m^2 --------- LA volume, ES, 1-p A2C 108 ml --------- LA volume/bsa, ES, 1-p A2C 46.1 ml/m^2 ---------  Mitral valve Value Reference Mitral E-wave peak velocity 82.9 cm/s --------- Mitral deceleration time (L) 100 ms  150 - 230 Mitral peak gradient, D 3 mm Hg ---------  Right ventricle Value Reference RV ID, ED, PLAX 30.9 mm 19 - 38  Pulmonic valve Value Reference Pulmonic valve peak velocity, S 44.6 cm/s ---------  Legend: (L) and (H) mark values outside specified reference range.  ------------------------------------------------------------------- Prepared and Electronically Authenticated by  Miquel Dunn, MD 2017-06-06T16:55:03     Radiological Exams on Admission: Ct Head Wo Contrast  06/21/2016  CLINICAL DATA:  slurred speech that began approx x 2 hours ago. He reports that the symptom is intermittent and he is not currently experiencing it. No headache or weakness noted. Denies visual changes. Ambulatory to triage with NAD noted. Patient grossly NI in triage; no pronator drift; face symmetrical; BUE grip strength equal; pedal pushes normal. EXAM: CT HEAD WITHOUT CONTRAST TECHNIQUE: Contiguous axial images were obtained from the base of the skull through the vertex without intravenous contrast. COMPARISON:  06/21/2008 FINDINGS: Brain: No evidence of acute infarction, hemorrhage, extra-axial collection, ventriculomegaly, or mass effect. Vascular: No hyperdense vessel or unexpected calcification. Skull: Negative for fracture or focal lesion. Sinuses/Orbits: No acute findings. Other: None. IMPRESSION: 1. Negative for bleed or other acute intracranial process. Critical Value/emergent results were called by telephone at the time of interpretation on 06/21/2016 at 11:25 pm to Dr. Marjean Donna , who verbally acknowledged these results. Electronically Signed   By: Keturah Barre  Vernard Gambles M.D.   On: 06/21/2016 23:27    Assessment/Plan Present on Admission:  . TIA (transient ischemic attack) -bring in for 23 hour observation -change ASA to aggrenox daily.  Patient failed ASA therapy -neuro checks -lipid panel in AM -carotid US, 2D echo  CAD -EF 20-25%. Stable  Elevated troponin -Chronic, improved compared to baseline  HTN -stable,  Resume home medications -PRN blood pressure meds ordered   Celeste Candelas 06/22/2016, 2:52 AM

## 2016-06-22 NOTE — ED Notes (Signed)
CRITICAL LAB: TROPONIN is .004 , [facility] Lab, Dr. Dahlia Client notified, orders recieved

## 2016-06-22 NOTE — Progress Notes (Signed)
Contacted Dr. Eston Esters about ECHO that was just completed. MD is no longer in the hospital and has not looked at the ECHO. MD stated no need to keep the patient in the hospital for an ECHO reading. Test has been completed and patient will discharge with follow up to Dr. Etta Quill office on 6/30. Instructed patient to call cardiology office on Monday to check on ECHO. Patient verbalized understanding. IV and tele removed. Discharge instructions given to patient along with stroke education and medication education. Questions answered.

## 2016-06-22 NOTE — Progress Notes (Signed)
Informed by ECHO department that someone will not be here until after 5PM to perform [facility] and they will be performed in the order they were received, so patient will not be seen first. Dr. Doy Mince called to see if patient could have ECHO done outpatient this week and MD stated she would prefer test to be completed now due to new stroke. Dr. Tressia Miners updated and patient as well. Patient will still discharge this evening once test is completed.

## 2016-06-22 NOTE — ED Notes (Signed)
Called report att

## 2016-06-22 NOTE — ED Provider Notes (Signed)
[facility] Emergency Department Provider Note   ____________________________________________  Time seen: Approximately 2316 PM  I have reviewed the triage vital signs and the nursing notes.   HISTORY  Chief Complaint Slurred speech (intermittent)     HPI Stephen [name] is a 50 y.o. male who comes into the hospital today with some slurred speech. The patient reports he was at a friend's house around 9 PM when he couldn't remember if he had played his lottery numbers today. He reports that his words sounded slurred. His friend noticed that his face looked dark but did not comment on if he had any facial drooping. He reports that they came to the hospital and the symptoms resolved around the time the patient arrived to the hospital. He reports that he had slurred speech for approximately 90 minutes. The patient reports he's never had this happen before. He denies any one-sided weakness numbness or tingling. The patient did have some mild shortness of breath but denies any chest pain, blurred vision, headache, nausea, vomiting. The patient was concerned about the symptoms that he decided to come into the hospital for further evaluation.   Past Medical History  Diagnosis Date  . Hypertension   . CHF (congestive heart failure) (HCC)     nonischemic cardiomyopathy, EF 25%    Patient Active Problem List   Diagnosis Date Noted  . TIA (transient ischemic attack) 06/22/2016  . HTN (hypertension) 06/22/2016  . CHF (congestive heart failure) ([place]) 06/04/2016    Past Surgical History  Procedure Laterality Date  . Knee surgery Right     Current Outpatient Rx  Name  Route  Sig  Dispense  Refill  . albuterol (PROVENTIL HFA;VENTOLIN HFA) 108 (90 Base) MCG/ACT inhaler   Inhalation   Inhale 2 puffs into the lungs every 4 (four) hours as needed for wheezing or shortness of breath.   1 Inhaler   0   . aspirin 81 MG chewable tablet   Oral   Chew 81 mg by  mouth daily.         . carvedilol (COREG) 6.25 MG tablet   Oral   Take 6.25 mg by mouth 2 (two) times daily.         . cyclobenzaprine (FLEXERIL) 10 MG tablet   Oral   Take 1 tablet (10 mg total) by mouth 3 (three) times daily as needed for muscle spasms.   21 tablet   0   . diclofenac sodium (VOLTAREN) 1 % GEL   Topical   Apply 2 g topically 4 (four) times daily. Apply to right scapular border tender spot for 1 week and stop   2 g   0   . furosemide (LASIX) 20 MG tablet   Oral   Take 1 tablet (20 mg total) by mouth daily.   30 tablet   2   . Multiple Vitamin (MULTIVITAMIN WITH MINERALS) TABS tablet   Oral   Take 1 tablet by mouth daily.         Marland Kitchen oxyCODONE (OXY IR/ROXICODONE) 5 MG immediate release tablet   Oral   Take 1-2 tablets (5-10 mg total) by mouth every 6 (six) hours as needed for moderate pain or severe pain.   20 tablet   0   . valsartan (DIOVAN) 320 MG tablet   Oral   Take 320 mg by mouth daily.         . varenicline (CHANTIX) 0.5 MG tablet   Oral   Take  0.5 mg by mouth 2 (two) times daily.           Allergies Lisinopril  Family History  Problem Relation Age of Onset  . Hypertension Father   . Hypertension Mother     Social History Social History  Substance Use Topics  . Smoking status: Former Smoker    Types: Cigarettes    Quit date: 02/20/2016  . Smokeless tobacco: Never Used     Comment: quit 2 months ago  . Alcohol Use: 0.0 oz/week    0 Standard drinks or equivalent per week     Comment: beer regularly    Review of Systems Constitutional: No fever/chills Eyes: No visual changes. ENT: No sore throat. Cardiovascular: Denies chest pain. Respiratory:  shortness of breath. Gastrointestinal: No abdominal pain.  No nausea, no vomiting.  No diarrhea.  No constipation. Genitourinary: Negative for dysuria. Musculoskeletal: Negative for back pain. Skin: Negative for rash. Neurological: Slurred speech  10-point ROS otherwise  negative.  ____________________________________________   PHYSICAL EXAM:  VITAL SIGNS: ED Triage Vitals  Enc Vitals Group     BP 06/21/16 2302 142/102 mmHg     Pulse Rate 06/21/16 2303 88     Resp 06/21/16 2302 16     Temp 06/21/16 2302 98.5 F (36.9 C)     Temp Source 06/21/16 2302 Oral     SpO2 06/21/16 2302 98 %     Weight 06/21/16 2302 225 lb (102.059 kg)     Height 06/21/16 2302 6\' 2"  (1.88 m)     Head Cir --      Peak Flow --      Pain Score 06/21/16 2303 0     Pain Loc --      Pain Edu? --      Excl. in [place]? --     Constitutional: Alert and oriented. Well appearing and in no acute distress. Eyes: Conjunctivae are normal. PERRL. EOMI. Head: Atraumatic. Nose: No congestion/rhinnorhea. Mouth/Throat: Mucous membranes are moist.  Oropharynx non-erythematous. Cardiovascular: Normal rate, regular rhythm. Grossly normal heart sounds.  Good peripheral circulation. Respiratory: Normal respiratory effort.  No retractions. Lungs CTAB. Gastrointestinal: Soft and nontender. No distention.  Musculoskeletal: No lower extremity tenderness nor edema.   Neurologic:  Normal speech and language. Cranial nerves II through XII are grossly intact with no focal motor or neuro deficits, patient with no pronator drift, speech is clear, rapid alternating movements intact, finger to nose intact, sensation intact throughout. Strength intact throughout. Skin:  Skin is warm, dry and intact. Psychiatric: Mood and affect are normal.   ____________________________________________   LABS (all labs ordered are listed, but only abnormal results are displayed)  Labs Reviewed  CBC - Abnormal; Notable for the following:    RDW 14.9 (*)    All other components within normal limits  BASIC METABOLIC PANEL - Abnormal; Notable for the following:    Glucose, Bld 105 (*)    BUN 24 (*)    Creatinine, Ser 1.44 (*)    GFR calc non Af Amer 55 (*)    All other components within normal limits  TROPONIN I -  Abnormal; Notable for the following:    Troponin I 0.04 (*)    All other components within normal limits  URINALYSIS COMPLETEWITH MICROSCOPIC ([facility] ONLY) - Abnormal; Notable for the following:    Color, Urine YELLOW (*)    APPearance CLEAR (*)    Protein, ur 30 (*)    Squamous Epithelial / LPF 0-5 (*)  All other components within normal limits  PROTIME-INR  APTT  MAGNESIUM  TROPONIN I   ____________________________________________  EKG  ED ECG REPORT I, Loney Hering, the attending physician, personally viewed and interpreted this ECG.   Date: 06/21/2016  EKG Time: 2306  Rate: 88  Rhythm: normal sinus rhythm  Axis: left axis deviation  Intervals:right bundle branch block  ST&T Change: flipped t waves in leads I and avl, frequent PVCs  ____________________________________________  RADIOLOGY  CT head: Negative for bleed or other acute intracranial process.  ____________________________________________   PROCEDURES  Procedure(s) performed: None  Critical Care performed: No  ____________________________________________   INITIAL IMPRESSION / ASSESSMENT AND PLAN / ED COURSE  Pertinent labs & imaging results that were available during my care of the patient were reviewed by me and considered in my medical decision making (see chart for details).  This is a 50 year old male who comes into the hospital today with some slurring of his speech. The patient's symptoms resolved prior to him coming in. While he did have symptoms 2 hours prior to coming in he has had an NIH stroke scale of 0. The patient's CT scan is unremarkable and although he has frequent PVCs his magnesium was also unremarkable. Given the patient's symptoms though I feel that he would be an appropriate patient for observation admission for workup for a TIA. I will contact the hospitalist service for admission for the patient. ____________________________________________   FINAL CLINICAL  IMPRESSION(S) / ED DIAGNOSES  Final diagnoses:  Slurred speech  Transient cerebral ischemia, unspecified transient cerebral ischemia type      NEW MEDICATIONS STARTED DURING THIS VISIT:  New Prescriptions   No medications on file     Note:  This document was prepared using Dragon voice recognition software and may include unintentional dictation errors.    Loney Hering, MD 06/22/16 [phone]

## 2016-06-23 LAB — ECHOCARDIOGRAM COMPLETE
FS: 6 % — AB (ref 28–44)
Height: 74 in
IV/PV OW: 0.8
LA ID, A-P, ES: 58 mm
LA diam end sys: 58 mm
LA diam index: 2.52 cm/m2
LA vol index: 87 mL/m2
LAVOL: 200 mL
LAVOLA4C: 164 mL
LVDIAVOL: 320 mL — AB (ref 62–150)
LVDIAVOLIN: 139 mL/m2
LVSYSVOL: 268 mL — AB (ref 21–61)
LVSYSVOLIN: 117 mL/m2
PW: 13.1 mm — AB (ref 0.6–1.1)
RV TAPSE: 26.5 mm
Simpson's disk: 16
Stroke v: 52 ml
Weight: 3648 oz

## 2016-06-26 ENCOUNTER — Encounter: Payer: Self-pay | Admitting: Family

## 2016-06-26 ENCOUNTER — Ambulatory Visit: Payer: Managed Care, Other (non HMO) | Attending: Family | Admitting: Family

## 2016-06-26 VITALS — BP 137/105 | HR 77 | Resp 18 | Ht 74.0 in | Wt 234.0 lb

## 2016-06-26 DIAGNOSIS — I5022 Chronic systolic (congestive) heart failure: Secondary | ICD-10-CM | POA: Diagnosis not present

## 2016-06-26 DIAGNOSIS — Z888 Allergy status to other drugs, medicaments and biological substances status: Secondary | ICD-10-CM | POA: Diagnosis not present

## 2016-06-26 DIAGNOSIS — I110 Hypertensive heart disease with heart failure: Secondary | ICD-10-CM | POA: Diagnosis not present

## 2016-06-26 DIAGNOSIS — Z87891 Personal history of nicotine dependence: Secondary | ICD-10-CM | POA: Insufficient documentation

## 2016-06-26 DIAGNOSIS — Z8673 Personal history of transient ischemic attack (TIA), and cerebral infarction without residual deficits: Secondary | ICD-10-CM | POA: Diagnosis not present

## 2016-06-26 DIAGNOSIS — R140 Abdominal distension (gaseous): Secondary | ICD-10-CM | POA: Insufficient documentation

## 2016-06-26 DIAGNOSIS — G459 Transient cerebral ischemic attack, unspecified: Secondary | ICD-10-CM | POA: Insufficient documentation

## 2016-06-26 DIAGNOSIS — Z7902 Long term (current) use of antithrombotics/antiplatelets: Secondary | ICD-10-CM | POA: Insufficient documentation

## 2016-06-26 DIAGNOSIS — R0602 Shortness of breath: Secondary | ICD-10-CM | POA: Diagnosis not present

## 2016-06-26 DIAGNOSIS — Z79899 Other long term (current) drug therapy: Secondary | ICD-10-CM | POA: Diagnosis not present

## 2016-06-26 DIAGNOSIS — I10 Essential (primary) hypertension: Secondary | ICD-10-CM

## 2016-06-26 DIAGNOSIS — G458 Other transient cerebral ischemic attacks and related syndromes: Secondary | ICD-10-CM

## 2016-06-26 NOTE — Patient Instructions (Signed)
Continue weighing daily and call for an overnight weight gain of > 2 pounds or a weekly weight gain of >5 pounds. 

## 2016-06-26 NOTE — Progress Notes (Signed)
Subjective:    Patient ID: Stephen [name], male    DOB: 02/25/1966, 50 y.o.   MRN: [id number]  Congestive Heart Failure Presents for initial visit. The disease course has been improving. Associated symptoms include fatigue, palpitations ("when get excited") and shortness of breath. Pertinent negatives include no abdominal pain, chest pain, chest pressure, edema or orthopnea. The symptoms have been improving. Past treatments include beta blockers, angiotensin receptor blockers and salt and fluid restriction. The treatment provided moderate relief. Compliance with prior treatments has been good. His past medical history is significant for CVA ("mini-stroke") and HTN. There is no history of CAD or DM.  Hypertension This is a chronic problem. The current episode started more than 1 year ago. The problem is unchanged. Associated symptoms include palpitations ("when get excited") and shortness of breath. Pertinent negatives include no chest pain, headaches, neck pain or peripheral edema. There are no associated agents to hypertension. Risk factors for coronary artery disease include family history, male gender and smoking/tobacco exposure. Past treatments include beta blockers, angiotensin blockers, diuretics and lifestyle changes. The current treatment provides moderate improvement. Hypertensive end-organ damage includes CVA ("mini-stroke") and heart failure.   Past Medical History  Diagnosis Date  . Hypertension   . CHF (congestive heart failure) (HCC)     nonischemic cardiomyopathy, EF 25%    Past Surgical History  Procedure Laterality Date  . Knee surgery Right     Family History  Problem Relation Age of Onset  . Hypertension Father   . Hypertension Mother     Social History  Substance Use Topics  . Smoking status: Former Smoker    Types: Cigarettes    Quit date: 02/20/2016  . Smokeless tobacco: Never Used  . Alcohol Use: 0.0 oz/week    0 Standard drinks or equivalent per week     Comment: beer occassional    Allergies  Allergen Reactions  . Lisinopril Cough    Prior to Admission medications   Medication Sig Start Date End Date Taking? Authorizing Provider  albuterol (PROVENTIL HFA;VENTOLIN HFA) 108 (90 Base) MCG/ACT inhaler Inhale 2 puffs into the lungs every 4 (four) hours as needed for wheezing or shortness of breath. 12/28/15  Yes Marylene Land, NP  atorvastatin (LIPITOR) 40 MG tablet Take 1 tablet (40 mg total) by mouth daily at 6 PM. 06/22/16  Yes [name], MD  carvedilol (COREG) 6.25 MG tablet Take 6.25 mg by mouth 2 (two) times daily. 06/20/16  Yes Historical Provider, MD  clopidogrel (PLAVIX) 75 MG tablet Take 1 tablet (75 mg total) by mouth daily. 06/22/16  Yes [name], MD  cyclobenzaprine (FLEXERIL) 10 MG tablet Take 1 tablet (10 mg total) by mouth 3 (three) times daily as needed for muscle spasms. 06/05/16  Yes [name], MD  furosemide (LASIX) 20 MG tablet Take 1 tablet (20 mg total) by mouth daily. Patient taking differently: Take 40 mg by mouth daily.  06/05/16  Yes [name], MD  Multiple Vitamin (MULTIVITAMIN WITH MINERALS) TABS tablet Take 1 tablet by mouth daily.   Yes Historical Provider, MD  oxyCODONE (OXY IR/ROXICODONE) 5 MG immediate release tablet Take 1-2 tablets (5-10 mg total) by mouth every 6 (six) hours as needed for moderate pain or severe pain. 06/05/16  Yes [name], MD  valsartan (DIOVAN) 320 MG tablet Take 320 mg by mouth daily. 06/20/16  Yes Historical Provider, MD  varenicline (CHANTIX) 0.5 MG tablet Take 0.5 mg by mouth 2 (two) times daily.   Yes Historical  Provider, MD      Review of Systems  Constitutional: Positive for fatigue. Negative for appetite change.  HENT: Positive for congestion. Negative for postnasal drip and sore throat.   Eyes: Negative.   Respiratory: Positive for shortness of breath. Negative for cough and chest tightness.   Cardiovascular: Positive for palpitations ("when  get excited"). Negative for chest pain and leg swelling.  Gastrointestinal: Positive for abdominal distention. Negative for abdominal pain and constipation.  Endocrine: Negative.   Genitourinary: Negative.   Musculoskeletal: Negative for back pain and neck pain.  Skin: Negative.   Allergic/Immunologic: Negative.   Neurological: Negative for dizziness, light-headedness and headaches.  Hematological: Negative for adenopathy. Does not bruise/bleed easily.  Psychiatric/Behavioral: Negative for sleep disturbance (sleeping on 2 pillows) and dysphoric mood. The patient is not nervous/anxious.        Objective:   Physical Exam  Constitutional: He is oriented to person, place, and time. He appears well-developed and well-nourished.  HENT:  Head: Normocephalic and atraumatic.  Eyes: Conjunctivae are normal. Pupils are equal, round, and reactive to light.  Neck: Normal range of motion. Neck supple.  Cardiovascular: Normal rate and regular rhythm.   Pulmonary/Chest: Effort normal. He has no wheezes. He has no rales.  Abdominal: He exhibits distension. There is no tenderness.  Musculoskeletal: He exhibits no edema or tenderness.  Neurological: He is alert and oriented to person, place, and time.  Skin: Skin is warm and dry.  Psychiatric: He has a normal mood and affect. His behavior is normal. Thought content normal.  Nursing note and vitals reviewed.   BP 137/105 mmHg  Pulse 77  Resp 18  Ht 6\' 2"  (1.88 m)  Wt 234 lb (106.142 kg)  BMI 30.03 kg/m2  SpO2 100%       Assessment & Plan:  1: Chronic heart failure with reduced ejection fraction- Patient presents with fatigue and shortness of breath with moderate exertion (Class II) which quickly improves upon rest. Denies symptoms at rest. Currently doesn't have any pedal edema but does have some abdominal distention. Discussed possibly changing his diuretic to torsemide in the future. Is already weighing himself daily and says that his weight  has been stable. Instructed him to call for an overnight weight gain of >2 pounds or a weekly weight gain of >5 pounds. He is not adding any salt to his food and is using Mrs. Dash for seasoning. Reviewed a 2000mg  sodium diet and written information was given to him about this. Discussed changing his valsartan to entresto and could also add bidil in the future. [facility] went in and reviewed medications with him. May benefit from cardiac rehab although he is currently working. Will discuss at his next visit. There has been a discussion about an AICD but he says that he may get a second opinion prior to agreeing to this. He is concerned about whether it may affect anything that he does at his work.  2: HTN- Blood pressure elevated on recheck as well. Patient says that he checks it daily at home and says that it tends to run in the 120's/70-80's. Instructed him to write down his blood pressure and if it begins to elevate at home to call back. Could be elevated today because of this being his initial visit and he's talking quite a bit.  3: TIA- Patient was started on plavix recently due to a TIA and he says that he feels much better since being put on that medication.   Patient did  not bring his medications nor a list. Each medication was verbally reviewed with the patient and he was encouraged to bring the bottles to every visit to confirm accuracy of list.  Return in 1 month or sooner for any questions/problems before then.

## 2016-06-27 NOTE — Discharge Summary (Signed)
[facility] [place] NAME: Stephen [name]    MR#:  [id number]  DATE OF BIRTH:  1966/06/26  DATE OF ADMISSION:  06/21/2016 ADMITTING PHYSICIAN: Quintella Baton, MD  DATE OF DISCHARGE: 06/22/2016  6:50 PM  PRIMARY CARE PHYSICIAN: [name], MD    ADMISSION DIAGNOSIS:  Slurred speech [R47.81] Transient cerebral ischemia, unspecified transient cerebral ischemia type [G45.9]  DISCHARGE DIAGNOSIS:  Principal Problem:   TIA (transient ischemic attack) Active Problems:   HTN (hypertension)   SECONDARY DIAGNOSIS:   Past Medical History  Diagnosis Date  . Hypertension   . CHF (congestive heart failure) (HCC)     nonischemic cardiomyopathy, EF 25%  . TIA (transient ischemic attack) 06/21/16    HOSPITAL COURSE:   Stephen [name] is a 50 y.o. male with a known history of Hypertension, nonischemic cardiomyopathy with ejection fraction of 25% diagnosed presents to the hospital secondary to Slurred speech.  #1 acute CVA-symptoms have resolved in the hospital. -MRI of the brain showing small area of acute infarction in left frontal cortex and left parietal cortex. -Carotid Dopplers without any hemodynamically significant stenosis -Echocardiogram was repeated due to his cardiomyopathy to make sure there were no clots identified in the heart. -Patient is being discharged on Plavix that has been added onto aspirin, also high intensity statin added. -Should follow up with his cardiologist to see if he would be a candidate for anticoagulation due to his cardiomyopathy. -Has been ambulating fine and did not require any physical therapy needs  #2 Non-ischemic cardiomyopathy-known ejection fraction of 25%. Not in any acute exacerbation. -Continue cardiac medications including aspirin, Lasix, Coreg, valsartan and statin. -Continue to follow up with cardiology. They have recommended AICD placement however the patient has been delaying it,  counseled that he need to get it done to protect from arrhythmias  #3 hypertension-continue home medications  #4 tobacco use disorder-continue Chantix. Recently quit  Being discharged on outpatient follow-up recommended.  DISCHARGE CONDITIONS:   Guarded  CONSULTS OBTAINED:  Treatment Team:  Alexis Goodell, MD Catarina Hartshorn, MD  DRUG ALLERGIES:   Allergies  Allergen Reactions  . Lisinopril Cough    DISCHARGE MEDICATIONS:   Discharge Medication List as of 06/22/2016  4:11 PM    START taking these medications   Details  atorvastatin (LIPITOR) 40 MG tablet Take 1 tablet (40 mg total) by mouth daily at 6 PM., Starting 06/22/2016, Until Discontinued, Normal    clopidogrel (PLAVIX) 75 MG tablet Take 1 tablet (75 mg total) by mouth daily., Starting 06/22/2016, Until Discontinued, Normal      CONTINUE these medications which have NOT CHANGED   Details  albuterol (PROVENTIL HFA;VENTOLIN HFA) 108 (90 Base) MCG/ACT inhaler Inhale 2 puffs into the lungs every 4 (four) hours as needed for wheezing or shortness of breath., Starting 12/28/2015, Until Discontinued, Print    carvedilol (COREG) 6.25 MG tablet Take 6.25 mg by mouth 2 (two) times daily., Starting 06/20/2016, Until Discontinued, Historical Med    cyclobenzaprine (FLEXERIL) 10 MG tablet Take 1 tablet (10 mg total) by mouth 3 (three) times daily as needed for muscle spasms., Starting 06/05/2016, Until Discontinued, Print    furosemide (LASIX) 20 MG tablet Take 1 tablet (20 mg total) by mouth daily., Starting 06/05/2016, Until Discontinued, Normal    Multiple Vitamin (MULTIVITAMIN WITH MINERALS) TABS tablet Take 1 tablet by mouth daily., Until Discontinued, Historical Med    oxyCODONE (OXY IR/ROXICODONE) 5 MG immediate release tablet Take 1-2 tablets (5-10 mg  total) by mouth every 6 (six) hours as needed for moderate pain or severe pain., Starting 06/05/2016, Until Discontinued, Print    valsartan (DIOVAN) 320 MG tablet Take 320 mg  by mouth daily., Starting 06/20/2016, Until Discontinued, Historical Med    varenicline (CHANTIX) 0.5 MG tablet Take 0.5 mg by mouth 2 (two) times daily., Until Discontinued, Historical Med    aspirin 81 MG chewable tablet Chew 81 mg by mouth daily., Until Discontinued, Historical Med      STOP taking these medications     diclofenac sodium (VOLTAREN) 1 % GEL          DISCHARGE INSTRUCTIONS:   1. PCP f/u in 1-2 weeks 2. Cardiology f/u in 2 weeks  If you experience worsening of your admission symptoms, develop shortness of breath, life threatening emergency, suicidal or homicidal thoughts you must seek medical attention immediately by calling 911 or calling your MD immediately  if symptoms less severe.  You Must read complete instructions/literature along with all the possible adverse reactions/side effects for all the Medicines you take and that have been prescribed to you. Take any new Medicines after you have completely understood and accept all the possible adverse reactions/side effects.   Please note  You were cared for by a hospitalist during your hospital stay. If you have any questions about your discharge medications or the care you received while you were in the hospital after you are discharged, you can call the unit and asked to speak with the hospitalist on call if the hospitalist that took care of you is not available. Once you are discharged, your primary care physician will handle any further medical issues. Please note that NO REFILLS for any discharge medications will be authorized once you are discharged, as it is imperative that you return to your primary care physician (or establish a relationship with a primary care physician if you do not have one) for your aftercare needs so that they can reassess your need for medications and monitor your lab values.    Today   CHIEF COMPLAINT:   Chief Complaint  Patient presents with  . Slurred speech (intermittent)      VITAL SIGNS:  Blood pressure 140/94, pulse 80, temperature 98 F (36.7 C), temperature source Oral, resp. rate 20, height 6\' 2"  (1.88 m), weight 103.42 kg (228 lb), SpO2 92 %.  I/O:  No intake or output data in the 24 hours ending 06/27/16 0948  PHYSICAL EXAMINATION:   Physical Exam   GENERAL: 50 y.o.-year-old patient lying in the bed with no acute distress.  EYES: Pupils equal, round, reactive to light and accommodation. No scleral icterus. Extraocular muscles intact.  HEENT: Head atraumatic, normocephalic. Oropharynx and nasopharynx clear.  NECK: Supple, no jugular venous distention. No thyroid enlargement, no tenderness.  LUNGS: Normal breath sounds bilaterally, no wheezing, rhonchi or crepitation. No use of accessory muscles of respiration. Bibasilar crackles heard CARDIOVASCULAR: S1, S2 normal. No rubs, or gallops. 2/6 systolic murmur present ABDOMEN: Soft, nontender, nondistended. Bowel sounds present. No organomegaly or mass.  EXTREMITIES: No cyanosis, or clubbing. Trace pedal edema noted. NEUROLOGIC: Cranial nerves II through XII are intact. Muscle strength 5/5 in all extremities. Sensation intact. Gait not checked.  PSYCHIATRIC: The patient is alert and oriented x 3.  SKIN: No obvious rash, lesion, or ulcer.   DATA REVIEW:   CBC  Recent Labs Lab 06/22/16 0755  WBC 7.8  HGB 15.1  HCT 46.2  PLT 199    Chemistries  Recent Labs Lab 06/21/16 2326 06/22/16 0755  NA 137 137  K 4.2 3.8  CL 107 107  CO2 23 21*  GLUCOSE 105* 110*  BUN 24* 21*  CREATININE 1.44* 1.03  CALCIUM 9.2 9.0  MG 2.0  --     Cardiac Enzymes  Recent Labs Lab 06/22/16 0253  TROPONINI 0.04*    Microbiology Results  No results found for this or any previous visit.  RADIOLOGY:  No results found.  EKG:   Orders placed or performed during the hospital encounter of 06/21/16  . EKG      Management plans discussed with the patient, family and they are in  agreement.  CODE STATUS:  Code Status History    Date Active Date Inactive Code Status Order ID Comments User Context   06/22/2016  4:30 AM 06/22/2016  9:50 PM Full Code [id number]  Quintella Baton, MD Inpatient   06/04/2016  6:43 PM 06/06/2016  6:15 PM Full Code [id number]  [name], MD Inpatient      TOTAL TIME TAKING CARE OF THIS PATIENT: 37 minutes.    [name] M.D on 06/27/2016 at 9:48 AM  Between 7am to 6pm - Pager - [phone]  After 6pm go to [email or web address] - password [email or web address] [facility]  [facility]  Office  [phone]  CC: Primary care physician; [name], MD

## 2016-07-26 ENCOUNTER — Ambulatory Visit: Payer: Managed Care, Other (non HMO) | Attending: Family | Admitting: Family

## 2016-07-26 ENCOUNTER — Encounter: Payer: Self-pay | Admitting: Family

## 2016-07-26 VITALS — BP 142/90 | HR 74 | Resp 18 | Ht 74.0 in | Wt 242.0 lb

## 2016-07-26 DIAGNOSIS — Z8249 Family history of ischemic heart disease and other diseases of the circulatory system: Secondary | ICD-10-CM | POA: Diagnosis not present

## 2016-07-26 DIAGNOSIS — I110 Hypertensive heart disease with heart failure: Secondary | ICD-10-CM | POA: Insufficient documentation

## 2016-07-26 DIAGNOSIS — Z888 Allergy status to other drugs, medicaments and biological substances status: Secondary | ICD-10-CM | POA: Diagnosis not present

## 2016-07-26 DIAGNOSIS — Z8673 Personal history of transient ischemic attack (TIA), and cerebral infarction without residual deficits: Secondary | ICD-10-CM | POA: Diagnosis not present

## 2016-07-26 DIAGNOSIS — G458 Other transient cerebral ischemic attacks and related syndromes: Secondary | ICD-10-CM | POA: Insufficient documentation

## 2016-07-26 DIAGNOSIS — H00013 Hordeolum externum right eye, unspecified eyelid: Secondary | ICD-10-CM | POA: Diagnosis not present

## 2016-07-26 DIAGNOSIS — I10 Essential (primary) hypertension: Secondary | ICD-10-CM

## 2016-07-26 DIAGNOSIS — R5383 Other fatigue: Secondary | ICD-10-CM | POA: Insufficient documentation

## 2016-07-26 DIAGNOSIS — R0602 Shortness of breath: Secondary | ICD-10-CM | POA: Insufficient documentation

## 2016-07-26 DIAGNOSIS — I5022 Chronic systolic (congestive) heart failure: Secondary | ICD-10-CM | POA: Diagnosis present

## 2016-07-26 NOTE — Patient Instructions (Addendum)
Continue weighing daily and call for an overnight weight gain of > 2 pounds or a weekly weight gain of >5 pounds.  When you have 1 week left of valsartan, call us so that the new Entresto prescription can be called into your pharmacy. At that time, you will begin Entresto twice daily in place of the valsartan.

## 2016-07-26 NOTE — Progress Notes (Signed)
Subjective:    Patient ID: Stephen [name], male    DOB: 1966/04/24, 50 y.o.   MRN: [id number]  Congestive Heart Failure  Presents for follow-up visit. Associated symptoms include fatigue and shortness of breath. Pertinent negatives include no abdominal pain, chest pain, orthopnea or palpitations. The symptoms have been stable. Compliance with total regimen is 76-100%.  Hypertension  This is a chronic problem. The current episode started more than 1 year ago. The problem has been waxing and waning since onset. Associated symptoms include shortness of breath. Pertinent negatives include no chest pain, neck pain, palpitations or peripheral edema. Risk factors for coronary artery disease include male gender. Past treatments include angiotensin blockers, beta blockers, diuretics and lifestyle changes. There are no compliance problems.  Hypertensive end-organ damage includes heart failure.   Past Medical History:  Diagnosis Date  . CHF (congestive heart failure) (HCC)    nonischemic cardiomyopathy, EF 25%  . Hypertension   . TIA (transient ischemic attack) 06/21/16    Past Surgical History:  Procedure Laterality Date  . KNEE SURGERY Right   . VASECTOMY      Family History  Problem Relation Age of Onset  . Hypertension Father   . Hypertension Mother     Social History  Substance Use Topics  . Smoking status: Former Smoker    Types: Cigarettes    Quit date: 02/20/2016  . Smokeless tobacco: Never Used  . Alcohol use 0.0 oz/week     Comment: beer occassional    Allergies  Allergen Reactions  . Lisinopril Cough    Prior to Admission medications   Medication Sig Start Date End Date Taking? Authorizing Provider  albuterol (PROVENTIL HFA;VENTOLIN HFA) 108 (90 Base) MCG/ACT inhaler Inhale 2 puffs into the lungs every 4 (four) hours as needed for wheezing or shortness of breath. 12/28/15  Yes Marylene Land, NP  atorvastatin (LIPITOR) 40 MG tablet Take 1 tablet (40 mg total) by  mouth daily at 6 PM. 06/22/16  Yes [name], MD  carvedilol (COREG) 6.25 MG tablet Take 6.25 mg by mouth 2 (two) times daily. 06/20/16  Yes Historical Provider, MD  clopidogrel (PLAVIX) 75 MG tablet Take 1 tablet (75 mg total) by mouth daily. 06/22/16  Yes [name], MD  cyclobenzaprine (FLEXERIL) 10 MG tablet Take 1 tablet (10 mg total) by mouth 3 (three) times daily as needed for muscle spasms. 06/05/16  Yes [name], MD  furosemide (LASIX) 20 MG tablet Take 1 tablet (20 mg total) by mouth daily. Patient taking differently: Take 40 mg by mouth daily.  06/05/16  Yes [name], MD  Multiple Vitamin (MULTIVITAMIN WITH MINERALS) TABS tablet Take 1 tablet by mouth daily.   Yes Historical Provider, MD  valsartan (DIOVAN) 320 MG tablet Take 320 mg by mouth daily. 06/20/16  Yes Historical Provider, MD      Review of Systems  Constitutional: Positive for fatigue. Negative for appetite change.  HENT: Negative for congestion, rhinorrhea and sore throat.   Eyes: Positive for pain (stye on right eye). Negative for visual disturbance.  Respiratory: Positive for shortness of breath. Negative for cough and chest tightness.   Cardiovascular: Negative for chest pain, palpitations and leg swelling.  Gastrointestinal: Negative for abdominal distention and abdominal pain.  Endocrine: Negative.   Genitourinary: Negative.   Musculoskeletal: Negative for back pain and neck pain.  Skin: Negative.   Allergic/Immunologic: Negative.   Neurological: Negative for dizziness and light-headedness.  Hematological: Negative for adenopathy. Does not bruise/bleed easily.  Psychiatric/Behavioral: Negative for dysphoric mood and sleep disturbance (sleeping on 2 pillows). The patient is not nervous/anxious.        Objective:   Physical Exam  Constitutional: He is oriented to person, place, and time. He appears well-developed and well-nourished.  HENT:  Head: Normocephalic and atraumatic.   Eyes: Conjunctivae are normal. Pupils are equal, round, and reactive to light.  Raised stye on right eyelid   Neck: Normal range of motion. Neck supple.  Cardiovascular: Normal rate and regular rhythm.   Pulmonary/Chest: Effort normal. He has no wheezes. He has no rales.  Abdominal: Soft. He exhibits no distension. There is no tenderness.  Musculoskeletal: He exhibits no edema or tenderness.  Neurological: He is alert and oriented to person, place, and time.  Skin: Skin is warm and dry.  Psychiatric: He has a normal mood and affect. His behavior is normal. Thought content normal.  Nursing note and vitals reviewed.  BP (!) 142/90   Pulse 74   Resp 18   Ht 6\' 2"  (1.88 m)   Wt 242 lb (109.8 kg)   SpO2 100%   BMI 31.07 kg/m         Assessment & Plan:  1: Chronic heart failure with reduced ejection fraction- Patient presents with fatigue and shortness of breath upon moderate exertion (Class II). Patient was able to walk into the office today without any difficulty. He continues to weigh himself daily and says that his weight has slowly increased. By our scale, he has gained 8.2 pounds since he was last here on 06/26/16. He says that his appetite has increased greatly. Reminded him to call for an overnight weight gain of >2 pounds or a weekly weight gain of >5 pounds. He is not adding any salt to his food and tries to eat low sodium foods. Discussed switching his valsartan to entresto and patient is interested but he recently got a 30 day supply of his valsartan. Instructed patient to finish taking the valsartan and when he has about 1 week left of valsartan to call back so that the entresto prescription can be called in. 30 day voucher already given to patient. Just saw his cardiologist yesterday. 2: HTN- Blood pressure elevated although it was better on recheck. He says that yesterday at his cardiology appointment, his blood pressure was 130/70 and at home it tends to run 140's/80's.  Encouraged him to continue checking it at home and if it gets higher than what it has been, to call back. 3: TIA- Patient says that he notices some difficulty at times thinking of a word that he'd like to say. No other deficits that he's aware of.  Patient did not bring his medications nor a list. Each medication was verbally reviewed with the patient and he was encouraged to bring the bottles to every visit to confirm accuracy of list.  Return in 2 months or sooner for any questions/problems before then.

## 2016-09-25 ENCOUNTER — Ambulatory Visit: Payer: Managed Care, Other (non HMO) | Attending: Family | Admitting: Family

## 2016-09-25 ENCOUNTER — Encounter: Payer: Self-pay | Admitting: Family

## 2016-09-25 VITALS — BP 140/98 | HR 80 | Resp 18 | Ht 74.0 in | Wt 243.0 lb

## 2016-09-25 DIAGNOSIS — I5022 Chronic systolic (congestive) heart failure: Secondary | ICD-10-CM | POA: Diagnosis present

## 2016-09-25 DIAGNOSIS — R5383 Other fatigue: Secondary | ICD-10-CM | POA: Diagnosis not present

## 2016-09-25 DIAGNOSIS — Z87891 Personal history of nicotine dependence: Secondary | ICD-10-CM | POA: Insufficient documentation

## 2016-09-25 DIAGNOSIS — Z8673 Personal history of transient ischemic attack (TIA), and cerebral infarction without residual deficits: Secondary | ICD-10-CM | POA: Insufficient documentation

## 2016-09-25 DIAGNOSIS — I110 Hypertensive heart disease with heart failure: Secondary | ICD-10-CM | POA: Diagnosis not present

## 2016-09-25 DIAGNOSIS — R5382 Chronic fatigue, unspecified: Secondary | ICD-10-CM

## 2016-09-25 DIAGNOSIS — Z79899 Other long term (current) drug therapy: Secondary | ICD-10-CM | POA: Diagnosis not present

## 2016-09-25 DIAGNOSIS — I10 Essential (primary) hypertension: Secondary | ICD-10-CM

## 2016-09-25 DIAGNOSIS — Z7902 Long term (current) use of antithrombotics/antiplatelets: Secondary | ICD-10-CM | POA: Insufficient documentation

## 2016-09-25 MED ORDER — SACUBITRIL-VALSARTAN 49-51 MG PO TABS
1.0000 | ORAL_TABLET | Freq: Two times a day (BID) | ORAL | 5 refills | Status: DC
Start: 2016-09-25 — End: 2016-09-25

## 2016-09-25 MED ORDER — SACUBITRIL-VALSARTAN 49-51 MG PO TABS: 1.0000 | ORAL_TABLET | Freq: Two times a day (BID) | ORAL | 5 refills | Status: DC

## 2016-09-25 NOTE — Patient Instructions (Addendum)
Continue weighing daily and call for an overnight weight gain of > 2 pounds or a weekly weight gain of >5 pounds.  Finish out your current valsartan dose. Then begin Entresto 49/51mg  twice daily.  Will check lab work at your next appointment.  Make follow-up appointment with Dr. Kandice Robinsons to have testosterone level checked.

## 2016-09-25 NOTE — Progress Notes (Signed)
Patient ID: Stephen [name], male    DOB: [birth year], 50 y.o.   MRN: [id number]  HPI  Stephen [name] is a 50 y/o male with a history of TIA, HTN and HF with reduced ejection fraction who returns for a follow-up.   Last echo was done 06/22/16 with an EF of 20%, mild MR, no aortic stenosis or regurgitation. Last saw his cardiologist in July 2017.  Last hospital admission was 06/21/16 for a TIA. Last HF admission was June 2017.  Returns today for a follow-up doing well. Weight has been stable by his home scale. Dyspneic on moderate exertion. Did take an extra furosemide a few days ago because he felt short of breath after eating a hamburger from a different restaurant than he's accustomed to. Took the extra furosemide for a few days and is now back to baseline. Able to work full-time but does feel fatigued during the day. Unsure if he snores. Some days wakes up feeling rested, other days he doesn't. Has not smoked any tobacco since 02/20/16.  Past Medical History:  Diagnosis Date  . CHF (congestive heart failure) (HCC)    nonischemic cardiomyopathy, EF 25%  . Hypertension   . TIA (transient ischemic attack) 06/21/16   Past Surgical History:  Procedure Laterality Date  . KNEE SURGERY Right   . VASECTOMY      Family History  Problem Relation Age of Onset  . Hypertension Father   . Hypertension Mother     Social History  Substance Use Topics  . Smoking status: Former Smoker    Types: Cigarettes    Quit date: 02/20/2016  . Smokeless tobacco: Never Used  . Alcohol use 0.0 oz/week     Comment: beer occassional    Allergies  Allergen Reactions  . Lisinopril Cough    Prior to Admission medications   Medication Sig Start Date End Date Taking? Authorizing Provider  albuterol (PROVENTIL HFA;VENTOLIN HFA) 108 (90 Base) MCG/ACT inhaler Inhale 2 puffs into the lungs every 4 (four) hours as needed for wheezing or shortness of breath. 12/28/15  Yes Marylene Land, NP  atorvastatin (LIPITOR)  40 MG tablet Take 1 tablet (40 mg total) by mouth daily at 6 PM. 06/22/16  Yes [name], MD  carvedilol (COREG) 6.25 MG tablet Take 6.25 mg by mouth 2 (two) times daily. 06/20/16  Yes Historical Provider, MD  clopidogrel (PLAVIX) 75 MG tablet Take 1 tablet (75 mg total) by mouth daily. 06/22/16  Yes [name], MD  furosemide (LASIX) 40 MG tablet Take 40 mg by mouth daily.   Yes Historical Provider, MD  Multiple Vitamin (MULTIVITAMIN WITH MINERALS) TABS tablet Take 1 tablet by mouth daily.   Yes Historical Provider, MD  sacubitril-valsartan (ENTRESTO) 49-51 MG Take 1 tablet by mouth 2 (two) times daily. Discontinue current valsartan (diovan) 09/25/16   [name], FNP     Review of Systems  Constitutional: Positive for fatigue. Negative for appetite change.  HENT: Negative for congestion, postnasal drip and sore throat.   Eyes: Negative.   Respiratory: Positive for shortness of breath. Negative for chest tightness.   Cardiovascular: Negative for chest pain, palpitations and leg swelling.  Gastrointestinal: Negative for abdominal distention and abdominal pain.  Endocrine: Negative.   Genitourinary: Negative.   Musculoskeletal: Negative for back pain and neck pain.  Skin: Negative.   Allergic/Immunologic: Negative.   Neurological: Negative for dizziness and light-headedness.  Hematological: Negative for adenopathy. Does not bruise/bleed easily.  Psychiatric/Behavioral: Positive for sleep disturbance (wake  up feeling tired; sleeping on 1 pillow). Negative for dysphoric mood. The patient is nervous/anxious (at times ).    Vitals:   09/25/16 0828 09/25/16 0849  BP: (!) 146/101 (!) 140/98  Pulse: 80   Resp: 18   SpO2: 100%   Weight: 243 lb (110.2 kg)   Height: 6\' 2"  (1.88 m)       Physical Exam  Constitutional: He is oriented to person, place, and time. He appears well-developed and well-nourished.  HENT:  Head: Normocephalic and atraumatic.  Eyes: Conjunctivae are  normal. Pupils are equal, round, and reactive to light.  Neck: Normal range of motion. Neck supple.  Cardiovascular: Normal rate and regular rhythm.   Pulmonary/Chest: Effort normal. He has no wheezes. He has no rales.  Abdominal: Soft. He exhibits no distension. There is no tenderness.  Musculoskeletal: He exhibits no edema or tenderness.  Neurological: He is alert and oriented to person, place, and time.  Skin: Skin is warm and dry.  Psychiatric: He has a normal mood and affect. His behavior is normal. Thought content normal.  Nursing note and vitals reviewed.    Assessment & Plan:  1: Chronic heart failure with reduced ejection fraction-  - NYHA Class II - Volume status stable, lungs clear, no edema - Continue daily weights & call for overnight weight gain of >2 pounds or a weekly weight gain of >5 pounds.  - Switch to [facility] after finishing current valsartan bottle (about 10 days worth left). Patient instructed to not take valsartan in addition to [facility]. Since he's on 320mg  valsartan daily will begin Entresto at 49/51mg  twice daily.  [facility] went in and reviewed medications - Will check a basic metabolic panel at next visit - Sees cardiologist Stephen [name]) January 2018  2: HTN-  - BP elevated even after recheck. - Beginning entresto per above - Has to reschedule his PCP appointment.   3: Fatigue- - Discussed possible sleep study in the future to rule out sleep apnea - Patient is going to ask PCP to check testosterone level  Return here in 1 month or sooner for any questions/problem

## 2016-10-25 ENCOUNTER — Ambulatory Visit: Payer: Managed Care, Other (non HMO) | Admitting: Family

## 2016-11-26 ENCOUNTER — Ambulatory Visit: Payer: Managed Care, Other (non HMO) | Admitting: Family

## 2017-03-22 ENCOUNTER — Ambulatory Visit
Admission: EM | Admit: 2017-03-22 | Discharge: 2017-03-22 | Disposition: A | Discharge status: Home / Self Care | Payer: Commercial Managed Care - PPO | Attending: Family Medicine | Admitting: Family Medicine

## 2017-03-22 DIAGNOSIS — J209 Acute bronchitis, unspecified: Secondary | ICD-10-CM

## 2017-03-22 DIAGNOSIS — J069 Acute upper respiratory infection, unspecified: Secondary | ICD-10-CM

## 2017-03-22 MED ORDER — HYDROCOD POLST-CPM POLST ER 10-8 MG/5ML PO SUER: 5.0000 mL | Freq: Two times a day (BID) | ORAL | 0 refills | Status: DC | PRN

## 2017-03-22 MED ORDER — PREDNISONE 10 MG (21) PO TBPK: ORAL_TABLET | ORAL | 0 refills | Status: DC

## 2017-03-22 MED ORDER — ALBUTEROL SULFATE HFA 108 (90 BASE) MCG/ACT IN AERS: 2.0000 | INHALATION_SPRAY | RESPIRATORY_TRACT | 0 refills | Status: DC | PRN

## 2017-03-22 MED ORDER — AZITHROMYCIN 250 MG PO TABS: ORAL_TABLET | ORAL | 0 refills | Status: DC

## 2017-03-22 NOTE — ED Provider Notes (Signed)
[facility]    CSN: [id number] Arrival date & time: 03/22/17  0904     History   Chief Complaint Chief Complaint  Patient presents with  . URI    HPI Stephen [name] is a 51 y.o. male.   Patient is a 51 year old black male history of hypertension and CHF who presents with cough for about 3 weeks. He states he's been coughing for 3 weeks not been able to shake it. He denies any or stiff Kamal sputum stasis had difficulty bringing it up. No known drug allergies. He is a former smoker. Is also no some wheezing with the cough especially at night. None the home remedies he sees his work turbinate ineffective suppressed. He has an albuterol inhaler despite out that he's used for the wheezing when it aggravates him at night. Along with having CHF he's had hypertension TIAs in the past vasectomy and knee surgery as well as kidney stone. He is allergic to Cipro and is a strong family history hypertension family as well.   The history is provided by the patient. No language interpreter was used.  URI  Presenting symptoms: congestion and cough   Presenting symptoms: no rhinorrhea   Severity:  Moderate Timing:  Constant Progression:  Worsening Chronicity:  New Relieved by:  Nothing Worsened by:  Nothing Ineffective treatments:  Breathing Associated symptoms: wheezing     Past Medical History:  Diagnosis Date  . CHF (congestive heart failure) (HCC)    nonischemic cardiomyopathy, EF 25%  . Hypertension   . TIA (transient ischemic attack) 06/21/16    Patient Active Problem List   Diagnosis Date Noted  . TIA (transient ischemic attack) 06/22/2016  . HTN (hypertension) 06/22/2016  . CHF (congestive heart failure) ([place]) 06/04/2016    Past Surgical History:  Procedure Laterality Date  . KNEE SURGERY Right   . VASECTOMY         Home Medications    Prior to Admission medications   Medication Sig Start Date End Date Taking? Authorizing Provider  varenicline  (CHANTIX) 0.5 MG tablet Take 0.5 mg by mouth 2 (two) times daily.   Yes Historical Provider, MD  albuterol (PROVENTIL HFA;VENTOLIN HFA) 108 (90 Base) MCG/ACT inhaler Inhale 2 puffs into the lungs every 4 (four) hours as needed for wheezing or shortness of breath. 12/28/15   Marylene Land, NP  albuterol (PROVENTIL HFA;VENTOLIN HFA) 108 (90 Base) MCG/ACT inhaler Inhale 2 puffs into the lungs every 4 (four) hours as needed for wheezing or shortness of breath. 03/22/17   [name], MD  atorvastatin (LIPITOR) 40 MG tablet Take 1 tablet (40 mg total) by mouth daily at 6 PM. 06/22/16   [name], MD  azithromycin (ZITHROMAX Z-PAK) 250 MG tablet Take 2 tablets first day and then 1 po a day for 4 days 03/22/17   [name], MD  carvedilol (COREG) 6.25 MG tablet Take 6.25 mg by mouth 2 (two) times daily. 06/20/16   Historical Provider, MD  chlorpheniramine-HYDROcodone (TUSSIONEX PENNKINETIC ER) 10-8 MG/5ML SUER Take 5 mLs by mouth every 12 (twelve) hours as needed. 03/22/17   [name], MD  clopidogrel (PLAVIX) 75 MG tablet Take 1 tablet (75 mg total) by mouth daily. 06/22/16   [name], MD  furosemide (LASIX) 40 MG tablet Take 40 mg by mouth daily.    Historical Provider, MD  Multiple Vitamin (MULTIVITAMIN WITH MINERALS) TABS tablet Take 1 tablet by mouth daily.    Historical Provider, MD  predniSONE (STERAPRED UNI-PAK 21  TAB) 10 MG (21) TBPK tablet Sig 6 tablet day 1, 5 tablets day 2, 4 tablets day 3,,3tablets day 4, 2 tablets day 5, 1 tablet day 6 take all tablets orally 03/22/17   [name], MD  sacubitril-valsartan (ENTRESTO) 49-51 MG Take 1 tablet by mouth 2 (two) times daily. Discontinue current valsartan (diovan) 09/25/16   [name], FNP    Family History Family History  Problem Relation Age of Onset  . Hypertension Mother   . Hypertension Father     Social History Social History  Substance Use Topics  . Smoking status: Former Smoker    Types: Cigarettes    Quit date:  02/20/2016  . Smokeless tobacco: Never Used  . Alcohol use 0.0 oz/week     Comment: beer occassional     Allergies   Lisinopril   Review of Systems Review of Systems  HENT: Positive for congestion. Negative for rhinorrhea.   Respiratory: Positive for cough and wheezing.   All other systems reviewed and are negative.    Physical Exam Triage Vital Signs ED Triage Vitals  Enc Vitals Group     BP 03/22/17 0936 (!) 159/109     Pulse Rate 03/22/17 0936 (!) 48     Resp 03/22/17 0936 20     Temp 03/22/17 0936 97.9 F (36.6 C)     Temp Source 03/22/17 0936 Oral     SpO2 03/22/17 0936 100 %     Weight 03/22/17 0932 244 lb (110.7 kg)     Height 03/22/17 0932 6\' 2"  (1.88 m)     Head Circumference --      Peak Flow --      Pain Score --      Pain Loc --      Pain Edu? --      Excl. in [place]? --    No data found.   Updated Vital Signs BP (!) 159/109 (BP Location: Left Arm)   Pulse (!) 48 Comment: pt reports low heart rate normal for him  Temp 97.9 F (36.6 C) (Oral)   Resp 20   Ht 6\' 2"  (1.88 m)   Wt 244 lb (110.7 kg)   SpO2 100%   BMI 31.33 kg/m   Visual Acuity Right Eye Distance:   Left Eye Distance:   Bilateral Distance:    Right Eye Near:   Left Eye Near:    Bilateral Near:     Physical Exam  Constitutional: He is oriented to person, place, and time. He appears well-developed and well-nourished.  HENT:  Head: Normocephalic and atraumatic.  Right Ear: External ear normal.  Left Ear: External ear normal.  Eyes: Pupils are equal, round, and reactive to light.  Neck: Normal range of motion. Neck supple.  Cardiovascular: Normal rate and regular rhythm.   Pulmonary/Chest: Effort normal and breath sounds normal.  Musculoskeletal: Normal range of motion. He exhibits no edema or deformity.  Neurological: He is alert and oriented to person, place, and time.  Skin: Skin is warm.  Psychiatric: He has a normal mood and affect.  Vitals reviewed.    UC Treatments /  Results  Labs (all labs ordered are listed, but only abnormal results are displayed) Labs Reviewed - No data to display  EKG  EKG Interpretation None       Radiology No results found.  Procedures Procedures (including critical care time)  Medications Ordered in UC Medications - No data to display   Initial Impression / Assessment and Plan /  UC Course  I have reviewed the triage vital signs and the nursing notes.  Pertinent labs & imaging results that were available during my care of the patient were reviewed by me and considered in my medical decision making (see chart for details).     Patient will be treated for bronchitis with proximal spasm suspected. Weeks. Placement Tussionex 1 teaspoon twice a day use albuterol inhaler refill given to him. Z-Pak and also 60 course of prednisone follow-up E of choice to 3 weeks not better skin work note for today and tomorrow as well.  Final Clinical Impressions(s) / UC Diagnoses   Final diagnoses:  Acute upper respiratory infection  Acute bronchitis with bronchospasm    New Prescriptions New Prescriptions   ALBUTEROL (PROVENTIL HFA;VENTOLIN HFA) 108 (90 BASE) MCG/ACT INHALER    Inhale 2 puffs into the lungs every 4 (four) hours as needed for wheezing or shortness of breath.   AZITHROMYCIN (ZITHROMAX Z-PAK) 250 MG TABLET    Take 2 tablets first day and then 1 po a day for 4 days   CHLORPHENIRAMINE-HYDROCODONE (TUSSIONEX PENNKINETIC ER) 10-8 MG/5ML SUER    Take 5 mLs by mouth every 12 (twelve) hours as needed.   PREDNISONE (STERAPRED UNI-PAK 21 TAB) 10 MG (21) TBPK TABLET    Sig 6 tablet day 1, 5 tablets day 2, 4 tablets day 3,,3tablets day 4, 2 tablets day 5, 1 tablet day 6 take all tablets orally      BP (!) 159/109 (BP Location: Left Arm)   Pulse (!) 48   Temp 97.9 F (36.6 C) (Oral)   Resp 20   Ht 6\' 2"  (1.88 m)   Wt 244 lb (110.7 kg)      Note: This dictation was prepared with Dragon dictation along with smaller  Company secretary. Any transcriptional errors that result from this process are unintentional.   [name], MD 03/22/17 1040

## 2017-03-22 NOTE — ED Triage Notes (Signed)
Pt with URI sx for 3 weeks. Dry cough. Pt denies fevers or runny nose.

## 2017-05-20 ENCOUNTER — Emergency Department: Payer: Commercial Managed Care - PPO

## 2017-05-20 ENCOUNTER — Inpatient Hospital Stay
Admission: EM | Admit: 2017-05-20 | Discharge: 2017-05-21 | DRG: 293 | Disposition: A | Discharge status: Home / Self Care | Payer: Commercial Managed Care - PPO | Attending: Internal Medicine | Admitting: Internal Medicine

## 2017-05-20 ENCOUNTER — Encounter: Payer: Self-pay | Admitting: *Deleted

## 2017-05-20 DIAGNOSIS — Z7902 Long term (current) use of antithrombotics/antiplatelets: Secondary | ICD-10-CM | POA: Diagnosis not present

## 2017-05-20 DIAGNOSIS — Z7951 Long term (current) use of inhaled steroids: Secondary | ICD-10-CM

## 2017-05-20 DIAGNOSIS — Z87891 Personal history of nicotine dependence: Secondary | ICD-10-CM | POA: Diagnosis not present

## 2017-05-20 DIAGNOSIS — Z7952 Long term (current) use of systemic steroids: Secondary | ICD-10-CM

## 2017-05-20 DIAGNOSIS — I5023 Acute on chronic systolic (congestive) heart failure: Secondary | ICD-10-CM

## 2017-05-20 DIAGNOSIS — Z8673 Personal history of transient ischemic attack (TIA), and cerebral infarction without residual deficits: Secondary | ICD-10-CM | POA: Diagnosis not present

## 2017-05-20 DIAGNOSIS — Z79899 Other long term (current) drug therapy: Secondary | ICD-10-CM

## 2017-05-20 DIAGNOSIS — Z888 Allergy status to other drugs, medicaments and biological substances status: Secondary | ICD-10-CM

## 2017-05-20 DIAGNOSIS — R0602 Shortness of breath: Secondary | ICD-10-CM

## 2017-05-20 DIAGNOSIS — E876 Hypokalemia: Secondary | ICD-10-CM | POA: Diagnosis present

## 2017-05-20 DIAGNOSIS — I5022 Chronic systolic (congestive) heart failure: Secondary | ICD-10-CM

## 2017-05-20 DIAGNOSIS — I110 Hypertensive heart disease with heart failure: Principal | ICD-10-CM | POA: Diagnosis present

## 2017-05-20 DIAGNOSIS — I428 Other cardiomyopathies: Secondary | ICD-10-CM | POA: Diagnosis present

## 2017-05-20 LAB — CBC WITH DIFFERENTIAL/PLATELET
BASOS ABS: 0.1 10*3/uL (ref 0–0.1)
Basophils Relative: 1 %
EOS PCT: 4 %
Eosinophils Absolute: 0.4 10*3/uL (ref 0–0.7)
HEMATOCRIT: 51.4 % (ref 40.0–52.0)
HEMOGLOBIN: 16.8 g/dL (ref 13.0–18.0)
LYMPHS ABS: 2.8 10*3/uL (ref 1.0–3.6)
LYMPHS PCT: 26 %
MCH: 29.5 pg (ref 26.0–34.0)
MCHC: 32.7 g/dL (ref 32.0–36.0)
MCV: 90.2 fL (ref 80.0–100.0)
Monocytes Absolute: 0.9 10*3/uL (ref 0.2–1.0)
Monocytes Relative: 9 %
NEUTROS ABS: 6.4 10*3/uL (ref 1.4–6.5)
NEUTROS PCT: 60 %
PLATELETS: 205 10*3/uL (ref 150–440)
RBC: 5.7 MIL/uL (ref 4.40–5.90)
RDW: 15.4 % — ABNORMAL HIGH (ref 11.5–14.5)
WBC: 10.6 10*3/uL (ref 3.8–10.6)

## 2017-05-20 LAB — BASIC METABOLIC PANEL
ANION GAP: 10 (ref 5–15)
BUN: 19 mg/dL (ref 6–20)
CHLORIDE: 106 mmol/L (ref 101–111)
CO2: 24 mmol/L (ref 22–32)
Calcium: 9.1 mg/dL (ref 8.9–10.3)
Creatinine, Ser: 1.26 mg/dL — ABNORMAL HIGH (ref 0.61–1.24)
GFR calc Af Amer: >60 mL/min (ref >60)
GLUCOSE: 118 mg/dL — AB (ref 65–99)
POTASSIUM: 3.7 mmol/L (ref 3.5–5.1)
Sodium: 140 mmol/L (ref 135–145)

## 2017-05-20 LAB — TROPONIN I
TROPONIN I: 0.04 ng/mL — AB (ref <0.03)
Troponin I: 0.04 ng/mL (ref <0.03)
Troponin I: 0.04 ng/mL (ref <0.03)
Troponin I: 0.04 ng/mL (ref <0.03)

## 2017-05-20 LAB — BRAIN NATRIURETIC PEPTIDE: B Natriuretic Peptide: 1994 pg/mL — ABNORMAL HIGH (ref 0.0–100.0)

## 2017-05-20 MED ORDER — SODIUM CHLORIDE 0.9% FLUSH
3.0000 mL | Freq: Two times a day (BID) | INTRAVENOUS | Status: DC
  Administered 2017-05-20 – 2017-05-21 (×3): 3 mL via INTRAVENOUS

## 2017-05-20 MED ORDER — FUROSEMIDE 10 MG/ML IJ SOLN
80.0000 mg | Freq: Once | INTRAMUSCULAR | Status: AC
  Administered 2017-05-20: 80 mg via INTRAVENOUS
  Filled 2017-05-20: qty 8

## 2017-05-20 MED ORDER — ONDANSETRON HCL 4 MG PO TABS: 4.0000 mg | ORAL_TABLET | Freq: Four times a day (QID) | ORAL | Status: DC | PRN

## 2017-05-20 MED ORDER — FUROSEMIDE 10 MG/ML IJ SOLN
40.0000 mg | Freq: Two times a day (BID) | INTRAMUSCULAR | Status: DC
  Administered 2017-05-20 – 2017-05-21 (×2): 40 mg via INTRAVENOUS
  Filled 2017-05-20 (×2): qty 4

## 2017-05-20 MED ORDER — ACETAMINOPHEN 650 MG RE SUPP: 650.0000 mg | Freq: Four times a day (QID) | RECTAL | Status: DC | PRN

## 2017-05-20 MED ORDER — ONDANSETRON HCL 4 MG/2ML IJ SOLN: 4.0000 mg | Freq: Four times a day (QID) | INTRAMUSCULAR | Status: DC | PRN

## 2017-05-20 MED ORDER — SACUBITRIL-VALSARTAN 49-51 MG PO TABS
1.0000 | ORAL_TABLET | Freq: Two times a day (BID) | ORAL | Status: DC
  Administered 2017-05-20 – 2017-05-21 (×2): 1 via ORAL
  Filled 2017-05-20 (×4): qty 1

## 2017-05-20 MED ORDER — CLOPIDOGREL BISULFATE 75 MG PO TABS
75.0000 mg | ORAL_TABLET | Freq: Every day | ORAL | Status: DC
  Administered 2017-05-21: 75 mg via ORAL
  Filled 2017-05-20: qty 1

## 2017-05-20 MED ORDER — VARENICLINE TARTRATE 0.5 MG PO TABS
0.5000 mg | ORAL_TABLET | Freq: Two times a day (BID) | ORAL | Status: DC
  Filled 2017-05-20 (×5): qty 1

## 2017-05-20 MED ORDER — CARVEDILOL 6.25 MG PO TABS
6.2500 mg | ORAL_TABLET | Freq: Two times a day (BID) | ORAL | Status: DC
  Administered 2017-05-20 – 2017-05-21 (×2): 6.25 mg via ORAL
  Filled 2017-05-20 (×2): qty 1

## 2017-05-20 MED ORDER — ENOXAPARIN SODIUM 40 MG/0.4ML ~~LOC~~ SOLN
40.0000 mg | SUBCUTANEOUS | Status: DC
  Administered 2017-05-20: 40 mg via SUBCUTANEOUS
  Filled 2017-05-20: qty 0.4

## 2017-05-20 MED ORDER — ADULT MULTIVITAMIN W/MINERALS CH
1.0000 | ORAL_TABLET | Freq: Every day | ORAL | Status: DC
  Administered 2017-05-21: 1 via ORAL
  Filled 2017-05-20: qty 1

## 2017-05-20 MED ORDER — ATORVASTATIN CALCIUM 20 MG PO TABS
40.0000 mg | ORAL_TABLET | Freq: Every day | ORAL | Status: DC
  Administered 2017-05-21: 40 mg via ORAL
  Filled 2017-05-20: qty 2

## 2017-05-20 MED ORDER — ACETAMINOPHEN 325 MG PO TABS: 650.0000 mg | ORAL_TABLET | Freq: Four times a day (QID) | ORAL | Status: DC | PRN

## 2017-05-20 NOTE — ED Triage Notes (Signed)
Pt c/o shortness of breathe and dry, non-productive cough x 2 months, ws treated for bronchitis during this time. Pt denies n/v/d, fever andd chills.

## 2017-05-20 NOTE — ED Notes (Signed)
Dr. Tressia Miners notified of of elevated BP.  Ok to go to floor.  No additional orders given.

## 2017-05-20 NOTE — ED Notes (Signed)
AAOx3.  Skin warm and dry.  NAD 

## 2017-05-20 NOTE — H&P (Signed)
[facility] [place] NAME: Stephen [name]    MR#:  [id number]  DATE OF BIRTH:  January 01, 1966  DATE OF ADMISSION:  05/20/2017  PRIMARY CARE PHYSICIAN: [name], MD   REQUESTING/REFERRING PHYSICIAN: Dr. Lenise Arena  CHIEF COMPLAINT:   Chief Complaint  Patient presents with  . Shortness of Breath  . Cough    HISTORY OF PRESENT ILLNESS:  Stephen [name]  is a 51 y.o. male with a known history of Systolic CHF with EF of 98%, hypertension, history of TIA while undergoing cardiac catheterization presents to the hospital secondary to shortness of breath. -Patient states his symptoms started about 4 weeks ago at cold, cough and congestion. His bronchitis symptoms have resolved but his dyspnea has not improved. He even saw his cardiologist recently and actually was doing fine at the time. Symptoms got worse again last night that he couldn't sleep, couldn't breathe and so presented to emergency room. His oxygen saturations are normal while on room air at rest, however he was very dyspneic on ambulation and so being admitted for CHF exacerbation. Chest x-ray revealed pulmonary congestion. 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 No chest pain, no nausea or vomiting. No diaphoresis. No fevers or chills. Denies any recent travel.  PAST MEDICAL HISTORY:   Past Medical History:  Diagnosis Date  . CHF (congestive heart failure) (HCC)    nonischemic cardiomyopathy, EF 25%  . Hypertension   . TIA (transient ischemic attack) 06/21/16    PAST SURGICAL HISTORY:   Past Surgical History:  Procedure Laterality Date  . KNEE SURGERY Right   . VASECTOMY      SOCIAL HISTORY:   Social History  Substance Use Topics  . Smoking status: Former Smoker    Types: Cigarettes    Quit date: 02/20/2016  . Smokeless tobacco: Never Used  .  Alcohol use 0.0 oz/week     Comment: beer occassional    FAMILY HISTORY:   Family History  Problem Relation Age of Onset  . Hypertension Mother   . Heart failure Mother   . Hypertension Father   . CAD Father     DRUG ALLERGIES:   Allergies  Allergen Reactions  . Lisinopril Cough    REVIEW OF SYSTEMS:   Review of Systems  Constitutional: Negative for chills, fever, malaise/fatigue and weight loss.  HENT: Negative for ear discharge, ear pain, hearing loss, nosebleeds and tinnitus.   Eyes: Negative for blurred vision, double vision and photophobia.  Respiratory: Positive for shortness of breath. Negative for cough, hemoptysis and wheezing.   Cardiovascular: Negative for chest pain, palpitations, orthopnea and leg swelling.  Gastrointestinal: Negative for abdominal pain, constipation, diarrhea, heartburn, melena, nausea and vomiting.  Genitourinary: Negative for dysuria, frequency, hematuria and urgency.  Musculoskeletal: Negative for back pain, myalgias and neck pain.  Skin: Negative for rash.  Neurological: Negative for dizziness, tingling, tremors, sensory change, speech change, focal weakness and headaches.  Endo/Heme/Allergies: Does not bruise/bleed easily.  Psychiatric/Behavioral: Negative for depression.    MEDICATIONS AT HOME:   Prior to Admission medications   Medication Sig Start Date End Date Taking? Authorizing Provider  albuterol (PROVENTIL HFA;VENTOLIN HFA) 108 (90 Base) MCG/ACT inhaler Inhale 2 puffs into the lungs every 4 (four) hours as needed for wheezing or shortness of breath. 03/22/17  Yes [name], MD  atorvastatin (LIPITOR) 40  MG tablet Take 1 tablet (40 mg total) by mouth daily at 6 PM. Patient taking differently: Take 40 mg by mouth daily.  06/22/16  Yes [name], MD  carvedilol (COREG) 6.25 MG tablet Take 6.25 mg by mouth 2 (two) times daily. 06/20/16  Yes [provider]  clopidogrel (PLAVIX) 75 MG tablet Take 1 tablet (75 mg  total) by mouth daily. 06/22/16  Yes [name], MD  furosemide (LASIX) 40 MG tablet Take 40 mg by mouth daily.   Yes [provider]  Multiple Vitamin (MULTIVITAMIN WITH MINERALS) TABS tablet Take 1 tablet by mouth daily.   Yes [provider]  sacubitril-valsartan (ENTRESTO) 49-51 MG Take 1 tablet by mouth 2 (two) times daily. Discontinue current valsartan (diovan) 09/25/16  Yes [name] A, FNP  varenicline (CHANTIX) 0.5 MG tablet Take 0.5 mg by mouth 2 (two) times daily.   Yes [provider]  albuterol (PROVENTIL HFA;VENTOLIN HFA) 108 (90 Base) MCG/ACT inhaler Inhale 2 puffs into the lungs every 4 (four) hours as needed for wheezing or shortness of breath. Patient not taking: Reported on 05/20/2017 12/28/15   Marylene Land, NP  chlorpheniramine-HYDROcodone [facility] ER) 10-8 MG/5ML SUER Take 5 mLs by mouth every 12 (twelve) hours as needed. Patient not taking: Reported on 05/20/2017 03/22/17   [name], MD  predniSONE (STERAPRED UNI-PAK 21 TAB) 10 MG (21) TBPK tablet Sig 6 tablet day 1, 5 tablets day 2, 4 tablets day 3,,3tablets day 4, 2 tablets day 5, 1 tablet day 6 take all tablets orally Patient not taking: Reported on 05/20/2017 03/22/17   [name], MD      VITAL SIGNS:  Blood pressure (!) 119/103, pulse 85, temperature 97.7 F (36.5 C), temperature source Oral, resp. rate 16, height 6\' 1"  (1.854 m), weight 106.1 kg (234 lb), SpO2 96 %.  PHYSICAL EXAMINATION:   Physical Exam  GENERAL:  51 y.o.-year-old patient lying in the bed with no acute distress.  EYES: Pupils equal, round, reactive to light and accommodation. No scleral icterus. Extraocular muscles intact.  HEENT: Head atraumatic, normocephalic. Oropharynx and nasopharynx clear.  NECK:  Supple, no jugular venous distention. No thyroid enlargement, no tenderness.  LUNGS: Normal breath sounds bilaterally, no wheezing, rhonchi or crepitation. No use of accessory muscles of  respiration. Fine bibasilar crackles noted. CARDIOVASCULAR: S1, S2 normal. No murmurs, rubs, or gallops.  ABDOMEN: Soft, nontender, nondistended. Bowel sounds present. No organomegaly or mass.  EXTREMITIES: No pedal edema, cyanosis, or clubbing.  NEUROLOGIC: Cranial nerves II through XII are intact. Muscle strength 5/5 in all extremities. Sensation intact. Gait not checked.  PSYCHIATRIC: The patient is alert and oriented x 3.  SKIN: No obvious rash, lesion, or ulcer.   LABORATORY PANEL:   CBC  Recent Labs Lab 05/20/17 0651  WBC 10.6  HGB 16.8  HCT 51.4  PLT 205   ------------------------------------------------------------------------------------------------------------------  Chemistries   Recent Labs Lab 05/20/17 0651  NA 140  K 3.7  CL 106  CO2 24  GLUCOSE 118*  BUN 19  CREATININE 1.26*  CALCIUM 9.1   ------------------------------------------------------------------------------------------------------------------  Cardiac Enzymes  Recent Labs Lab 05/20/17 0957  TROPONINI 0.04*   ------------------------------------------------------------------------------------------------------------------  RADIOLOGY:  Dg Chest 2 View  Result Date: 05/20/2017 CLINICAL DATA:  Shortness of breath. EXAM: CHEST  2 VIEW COMPARISON:  06/06/2016 FINDINGS: The cardiac silhouette is moderately enlarged. There is increased pulmonary vascular congestion with new mild basilar predominant lung opacities including Kerley B-lines. No pleural effusion or pneumothorax is identified. No  acute osseous abnormality is seen. IMPRESSION: Cardiomegaly and mild pulmonary edema. Electronically Signed   By: Logan Bores M.D.   On: 05/20/2017 07:22    EKG:   Orders placed or performed during the hospital encounter of 05/20/17  . ED EKG  . ED EKG    IMPRESSION AND PLAN:   Stephen [name]  is a 51 y.o. male with a known history of Systolic CHF with EF of 25%, hypertension, history of TIA while  undergoing cardiac catheterization presents to the hospital secondary to shortness of breath.  #1 acute on chronic systolic CHF exacerbation-likely triggered by increased sodium intake and also recent bronchitis. -Admit to telemetry, IV diuresis with Lasix -Echocardiogram and cardiology consult. -Has history of nonischemic cardiomyopathy. - continue cardiac meds  #2 hypertension- on entresto and Coreg. -Also on Lasix.  #3 history of TIA-continue Plavix.  #4 DVT prophylaxis-on Lovenox    All the records are reviewed and case discussed with ED provider. Management plans discussed with the patient, family and they are in agreement.  CODE STATUS: Full Code  TOTAL TIME TAKING CARE OF THIS PATIENT: 50 minutes.    [name] M.D on 05/20/2017 at 2:02 PM  Between 7am to 6pm - Pager - [phone]  After 6pm go to [email or web address] - password [email or web address] [place] [facility]  Office  [phone]  CC: Primary care physician; [name], MD

## 2017-05-20 NOTE — ED Provider Notes (Signed)
[facility] Emergency Department Provider Note       Time seen: ----------------------------------------- 6:54 AM on 05/20/2017 -----------------------------------------     I have reviewed the triage vital signs and the nursing notes.   HISTORY   Chief Complaint Shortness of Breath and Cough    HPI Stephen [name] is a 51 y.o. male who presents to the ED for shortness of breath and dry nonproductive cough for 2 months. Patient was treated for bronchitis during this time period. He denies fevers, chills, chest pain, nausea, vomiting or diarrhea. Patient states he has had a nonproductive cough for 2 months but the shortness of breath is recent. He was seen by cardiology 2 weeks ago and reportedly no changes were made.   Past Medical History:  Diagnosis Date  . CHF (congestive heart failure) (HCC)    nonischemic cardiomyopathy, EF 25%  . Hypertension   . TIA (transient ischemic attack) 06/21/16    Patient Active Problem List   Diagnosis Date Noted  . TIA (transient ischemic attack) 06/22/2016  . HTN (hypertension) 06/22/2016  . CHF (congestive heart failure) ([place]) 06/04/2016    Past Surgical History:  Procedure Laterality Date  . KNEE SURGERY Right   . VASECTOMY      Allergies Lisinopril  Social History Social History  Substance Use Topics  . Smoking status: Former Smoker    Types: Cigarettes    Quit date: 02/20/2016  . Smokeless tobacco: Never Used  . Alcohol use 0.0 oz/week     Comment: beer occassional    Review of Systems Constitutional: Negative for fever. Eyes: Negative for vision changes ENT:  Negative for congestion, sore throat Cardiovascular: Negative for chest pain. Respiratory: Positive shortness of breath and cough Gastrointestinal: Negative for abdominal pain, vomiting and diarrhea. Genitourinary: Negative for dysuria. Musculoskeletal: Negative for back pain. Skin: Negative for rash. Neurological: Negative for  headaches, focal weakness or numbness.  All systems negative/normal/unremarkable except as stated in the HPI  ____________________________________________   PHYSICAL EXAM:  VITAL SIGNS: ED Triage Vitals  Enc Vitals Group     BP 05/20/17 0645 (!) 133/110     Pulse Rate 05/20/17 0645 (!) 102     Resp 05/20/17 0645 (!) 34     Temp 05/20/17 0645 97.8 F (36.6 C)     Temp Source 05/20/17 0645 Oral     SpO2 05/20/17 0645 97 %     Weight 05/20/17 0648 234 lb (106.1 kg)     Height 05/20/17 0648 6\' 1"  (1.854 m)     Head Circumference --      Peak Flow --      Pain Score --      Pain Loc --      Pain Edu? --      Excl. in [place]? --     Constitutional: Alert and oriented. Well appearing and in no distress. Eyes: Conjunctivae are normal. Normal extraocular movements. ENT   Head: Normocephalic and atraumatic.   Nose: No congestion/rhinnorhea.   Mouth/Throat: Mucous membranes are moist.   Neck: No stridor. Cardiovascular: No murmurs, gallop is noted Respiratory: Normal respiratory effort without tachypnea nor retractions. Breath sounds are clear and equal bilaterally. No wheezes/rales/rhonchi. Gastrointestinal: Soft and nontender. Normal bowel sounds Musculoskeletal: Nontender with normal range of motion in extremities. No lower extremity tenderness nor edema. Neurologic:  Normal speech and language. No gross focal neurologic deficits are appreciated.  Skin:  Skin is warm, dry and intact. No rash noted. Psychiatric: Mood and affect  are normal. Speech and behavior are normal.  ____________________________________________  EKG: Interpreted by me. Sinus rhythm rate 91 bpm, prolonged PR interval, wide QRS, normal QT, intraventricular conduction delay, LVH  ____________________________________________  ED COURSE:  Pertinent labs & imaging results that were available during my care of the patient were reviewed by me and considered in my medical decision making (see chart for  details). Patient presents for shortness of breath and cough, we will assess with labs and imaging as indicated. Clinical Course as of May 21 743  Mon May 20, 2017  6144 I have ordered IV Lasix for diuresis  [JW]    Clinical Course User Index [JW] Earleen Newport, MD   Procedures ____________________________________________   LABS (pertinent positives/negatives)  Labs Reviewed  CBC WITH DIFFERENTIAL/PLATELET - Abnormal; Notable for the following:       Result Value   RDW 15.4 (*)    All other components within normal limits  BASIC METABOLIC PANEL - Abnormal; Notable for the following:    Glucose, Bld 118 (*)    Creatinine, Ser 1.26 (*)    All other components within normal limits  TROPONIN I - Abnormal; Notable for the following:    Troponin I 0.04 (*)    All other components within normal limits  BRAIN NATRIURETIC PEPTIDE    RADIOLOGY Images were viewed by me  Chest x-ray IMPRESSION: Cardiomegaly and mild pulmonary edema. ____________________________________________  FINAL ASSESSMENT AND PLAN  Dyspnea, Congestive heart failure  Plan: Patient's labs and imaging were dictated above. Patient had presented for worsening shortness of breath which is likely heart failure related. He is persistently short of breath despite diuresis in the ER. He will likely need observation in a telemetry bed and repeat echocardiogram with cardiology consult. He is stable for admission at this time.   Earleen Newport, MD   Note: This note was generated in part or whole with voice recognition software. Voice recognition is usually quite accurate but there are transcription errors that can and very often do occur. I apologize for any typographical errors that were not detected and corrected.     Earleen Newport, MD 05/20/17 [phone]

## 2017-05-21 ENCOUNTER — Inpatient Hospital Stay
Admit: 2017-05-21 | Discharge: 2017-05-21 | Disposition: A | Discharge status: Home / Self Care | Payer: Commercial Managed Care - PPO | Attending: Internal Medicine | Admitting: Internal Medicine

## 2017-05-21 LAB — BASIC METABOLIC PANEL
Anion gap: 6 (ref 5–15)
BUN: 23 mg/dL — AB (ref 6–20)
CHLORIDE: 108 mmol/L (ref 101–111)
CO2: 27 mmol/L (ref 22–32)
Calcium: 8.9 mg/dL (ref 8.9–10.3)
Creatinine, Ser: 1.16 mg/dL (ref 0.61–1.24)
GFR calc Af Amer: >60 mL/min (ref >60)
GFR calc non Af Amer: >60 mL/min (ref >60)
GLUCOSE: 122 mg/dL — AB (ref 65–99)
POTASSIUM: 3 mmol/L — AB (ref 3.5–5.1)
Sodium: 141 mmol/L (ref 135–145)

## 2017-05-21 LAB — CBC
HEMATOCRIT: 48.3 % (ref 40.0–52.0)
HEMOGLOBIN: 15.9 g/dL (ref 13.0–18.0)
MCH: 29.6 pg (ref 26.0–34.0)
MCHC: 32.9 g/dL (ref 32.0–36.0)
MCV: 89.8 fL (ref 80.0–100.0)
Platelets: 181 10*3/uL (ref 150–440)
RBC: 5.38 MIL/uL (ref 4.40–5.90)
RDW: 15.3 % — AB (ref 11.5–14.5)
WBC: 8.5 10*3/uL (ref 3.8–10.6)

## 2017-05-21 LAB — HIV ANTIBODY (ROUTINE TESTING W REFLEX): HIV SCREEN 4TH GENERATION: NONREACTIVE

## 2017-05-21 MED ORDER — POTASSIUM CHLORIDE CRYS ER 20 MEQ PO TBCR
40.0000 meq | EXTENDED_RELEASE_TABLET | ORAL | Status: AC
  Administered 2017-05-21 (×2): 40 meq via ORAL
  Filled 2017-05-21 (×2): qty 2

## 2017-05-21 MED ORDER — POTASSIUM CHLORIDE ER 20 MEQ PO TBCR: 20.0000 meq | EXTENDED_RELEASE_TABLET | Freq: Every day | ORAL | 2 refills | Status: DC

## 2017-05-21 MED ORDER — FUROSEMIDE 40 MG PO TABS: 40.0000 mg | ORAL_TABLET | Freq: Two times a day (BID) | ORAL | 2 refills | Status: DC

## 2017-05-21 MED ORDER — FUROSEMIDE 40 MG PO TABS
40.0000 mg | ORAL_TABLET | Freq: Two times a day (BID) | ORAL | Status: DC
  Administered 2017-05-21: 40 mg via ORAL
  Filled 2017-05-21: qty 1

## 2017-05-21 NOTE — Progress Notes (Signed)
Patient discharged home per MD order. Home medications returned from pharmacy. All discharge instructions given and patient verbalized understanding of all instructions.

## 2017-05-21 NOTE — Care Management (Signed)
Informed by  attending, there are no discharge needs for this patient.  No discharge needs identified by other members of the care team

## 2017-05-21 NOTE — Discharge Summary (Signed)
[facility] [place] NAME: Stephen [name]    MR#:  [id number]  DATE OF BIRTH:  21-Mar-1966  DATE OF ADMISSION:  05/20/2017   ADMITTING PHYSICIAN: [name], MD  DATE OF DISCHARGE: 05/21/2017  PRIMARY CARE PHYSICIAN: [name], MD   ADMISSION DIAGNOSIS:   Shortness of breath [R06.02] Acute on chronic systolic congestive heart failure ([place]) [I50.23]  DISCHARGE DIAGNOSIS:   Active Problems:   CHF exacerbation ([place])   SECONDARY DIAGNOSIS:   Past Medical History:  Diagnosis Date  . CHF (congestive heart failure) (HCC)    nonischemic cardiomyopathy, EF 25%  . Hypertension   . TIA (transient ischemic attack) 06/21/16    HOSPITAL COURSE:   Stephen [name]  is a 51 y.o. male with a known history of Systolic CHF with EF of 58%, hypertension, history of TIA while undergoing cardiac catheterization presents to the hospital secondary to shortness of breath.  #1 acute on chronic systolic CHF exacerbation-likely triggered by increased sodium intake and also recent bronchitis. -Symptoms much improved with IV Lasix. Discharge on twice a day Lasix orally -Echocardiogram done and appreciate cardiology consult. -Has history of nonischemic cardiomyopathy. - continue cardiac meds -Low sodium diet advised.  #2 hypertension- on entresto and Coreg. -Also on Lasix.  #3 history of TIA-continue Plavix.  #4 hypokalemia-being replaced and oral supplements while on Lasix.  Stable for discharge today  DISCHARGE CONDITIONS:   Stable CONSULTS OBTAINED:   Treatment Team:  Yolonda Kida, MD  DRUG ALLERGIES:   Allergies  Allergen Reactions  . Lisinopril Cough   DISCHARGE MEDICATIONS:   Allergies as of 05/21/2017      Reactions   Lisinopril Cough      Medication List    STOP taking these medications   chlorpheniramine-HYDROcodone 10-8 MG/5ML Suer Commonly known as:  TUSSIONEX PENNKINETIC ER   predniSONE 10 MG  (21) Tbpk tablet Commonly known as:  STERAPRED UNI-PAK 21 TAB     TAKE these medications   albuterol 108 (90 Base) MCG/ACT inhaler Commonly known as:  PROVENTIL HFA;VENTOLIN HFA Inhale 2 puffs into the lungs every 4 (four) hours as needed for wheezing or shortness of breath. What changed:  Another medication with the same name was removed. Continue taking this medication, and follow the directions you see here.   atorvastatin 40 MG tablet Commonly known as:  LIPITOR Take 1 tablet (40 mg total) by mouth daily at 6 PM. What changed:  when to take this   carvedilol 6.25 MG tablet Commonly known as:  COREG Take 6.25 mg by mouth 2 (two) times daily.   clopidogrel 75 MG tablet Commonly known as:  PLAVIX Take 1 tablet (75 mg total) by mouth daily.   furosemide 40 MG tablet Commonly known as:  LASIX Take 1 tablet (40 mg total) by mouth 2 (two) times daily. What changed:  when to take this   multivitamin with minerals Tabs tablet Take 1 tablet by mouth daily.   Potassium Chloride ER 20 MEQ Tbcr Take 20 mEq by mouth daily.   sacubitril-valsartan 49-51 MG Commonly known as:  ENTRESTO Take 1 tablet by mouth 2 (two) times daily. Discontinue current valsartan (diovan)   varenicline 0.5 MG tablet Commonly known as:  CHANTIX Take 0.5 mg by mouth 2 (two) times daily.        DISCHARGE INSTRUCTIONS:   1. PCP follow-up in 1-2 weeks  DIET:   Cardiac diet  ACTIVITY:   Activity as tolerated  OXYGEN:   Home Oxygen: No.  Oxygen Delivery: room air  DISCHARGE LOCATION:   home   If you experience worsening of your admission symptoms, develop shortness of breath, life threatening emergency, suicidal or homicidal thoughts you must seek medical attention immediately by calling 911 or calling your MD immediately  if symptoms less severe.  You Must read complete instructions/literature along with all the possible adverse reactions/side effects for all the Medicines you take and  that have been prescribed to you. Take any new Medicines after you have completely understood and accpet all the possible adverse reactions/side effects.   Please note  You were cared for by a hospitalist during your hospital stay. If you have any questions about your discharge medications or the care you received while you were in the hospital after you are discharged, you can call the unit and asked to speak with the hospitalist on call if the hospitalist that took care of you is not available. Once you are discharged, your primary care physician will handle any further medical issues. Please note that NO REFILLS for any discharge medications will be authorized once you are discharged, as it is imperative that you return to your primary care physician (or establish a relationship with a primary care physician if you do not have one) for your aftercare needs so that they can reassess your need for medications and monitor your lab values.    On the day of Discharge:  VITAL SIGNS:   Blood pressure 105/78, pulse 75, temperature 98.5 F (36.9 C), temperature source Oral, resp. rate 18, height 6\' 1"  (1.854 m), weight 106.1 kg (234 lb), SpO2 97 %.  PHYSICAL EXAMINATION:    GENERAL:  51 y.o.-year-old patient lying in the bed with no acute distress.  EYES: Pupils equal, round, reactive to light and accommodation. No scleral icterus. Extraocular muscles intact.  HEENT: Head atraumatic, normocephalic. Oropharynx and nasopharynx clear.  NECK:  Supple, no jugular venous distention. No thyroid enlargement, no tenderness.  LUNGS: Normal breath sounds bilaterally, no wheezing, rhonchi or crepitation. No use of accessory muscles of respiration. Fine bibasilar crackles noted. CARDIOVASCULAR: S1, S2 normal. No murmurs, rubs, or gallops.  ABDOMEN: Soft, nontender, nondistended. Bowel sounds present. No organomegaly or mass.  EXTREMITIES: No pedal edema, cyanosis, or clubbing.  NEUROLOGIC: Cranial nerves II  through XII are intact. Muscle strength 5/5 in all extremities. Sensation intact. Gait not checked.  PSYCHIATRIC: The patient is alert and oriented x 3.  SKIN: No obvious rash, lesion, or ulcer.   DATA REVIEW:   CBC  Recent Labs Lab 05/21/17 0435  WBC 8.5  HGB 15.9  HCT 48.3  PLT 181    Chemistries   Recent Labs Lab 05/21/17 0435  NA 141  K 3.0*  CL 108  CO2 27  GLUCOSE 122*  BUN 23*  CREATININE 1.16  CALCIUM 8.9     Microbiology Results  No results found for this or any previous visit.  RADIOLOGY:  No results found.   Management plans discussed with the patient, family and they are in agreement.  CODE STATUS:     Code Status Orders        Start     Ordered   05/20/17 1227  Full code  Continuous     05/20/17 1226    Code Status History    Date Active Date Inactive Code Status Order ID Comments User Context   06/22/2016  4:30 AM 06/22/2016  9:50 PM Full Code [id number]  Crosley,  Debby, MD Inpatient   06/04/2016  6:43 PM 06/06/2016  6:15 PM Full Code [id number]  [name], MD Inpatient      TOTAL TIME TAKING CARE OF THIS PATIENT: 38 minutes.    [name] M.D on 05/21/2017 at 12:34 PM  Between 7am to 6pm - Pager - [phone]  After 6pm go to [email or web address] - Proofreader  [employer]  Hospitalists  Office  [phone]  CC: Primary care physician; [name], MD   Note: This dictation was prepared with Dragon dictation along with smaller phrase technology. Any transcriptional errors that result from this process are unintentional.

## 2017-05-21 NOTE — Plan of Care (Signed)
Problem: Fluid Volume: Goal: Ability to maintain a balanced intake and output will improve Outcome: Progressing Continues to diurese after meds given.

## 2017-05-21 NOTE — Discharge Instructions (Signed)
Heart Failure Clinic appointment on May 30, 2017 at 10:20am with [name], [place]. Please call [phone] to reschedule.

## 2017-05-21 NOTE — Progress Notes (Signed)
*  PRELIMINARY RESULTS* Echocardiogram 2D Echocardiogram has been performed.  Stephen [name] 05/21/2017, 10:50 AM

## 2017-05-21 NOTE — Progress Notes (Signed)
F/U appointment scheduled at the Heart Failure Clinic on May 30, 2017 at 10:20am. Of note, he has cancelled his last 2 appointments with [place] and hasn't been seen since September 2017.

## 2017-05-30 ENCOUNTER — Ambulatory Visit: Payer: Commercial Managed Care - PPO | Admitting: Family

## 2017-05-30 LAB — ECHOCARDIOGRAM COMPLETE
Height: 73 in
Weight: 3744 oz

## 2017-06-18 ENCOUNTER — Emergency Department: Payer: Commercial Managed Care - PPO

## 2017-06-18 ENCOUNTER — Inpatient Hospital Stay
Admission: EM | Admit: 2017-06-18 | Discharge: 2017-06-20 | DRG: 293 | Disposition: A | Discharge status: Home / Self Care | Payer: Commercial Managed Care - PPO | Attending: Internal Medicine | Admitting: Internal Medicine

## 2017-06-18 ENCOUNTER — Encounter: Payer: Self-pay | Admitting: Emergency Medicine

## 2017-06-18 ENCOUNTER — Ambulatory Visit (INDEPENDENT_AMBULATORY_CARE_PROVIDER_SITE_OTHER)
Admission: EM | Admit: 2017-06-18 | Discharge: 2017-06-18 | Disposition: A | Discharge status: Home / Self Care | Payer: Commercial Managed Care - PPO | Source: Home / Self Care | Attending: Family Medicine | Admitting: Family Medicine

## 2017-06-18 DIAGNOSIS — I509 Heart failure, unspecified: Secondary | ICD-10-CM | POA: Diagnosis not present

## 2017-06-18 DIAGNOSIS — I420 Dilated cardiomyopathy: Secondary | ICD-10-CM | POA: Diagnosis present

## 2017-06-18 DIAGNOSIS — R05 Cough: Secondary | ICD-10-CM | POA: Diagnosis not present

## 2017-06-18 DIAGNOSIS — R509 Fever, unspecified: Secondary | ICD-10-CM | POA: Diagnosis not present

## 2017-06-18 DIAGNOSIS — I5023 Acute on chronic systolic (congestive) heart failure: Secondary | ICD-10-CM | POA: Diagnosis present

## 2017-06-18 DIAGNOSIS — R0600 Dyspnea, unspecified: Secondary | ICD-10-CM

## 2017-06-18 DIAGNOSIS — Z79899 Other long term (current) drug therapy: Secondary | ICD-10-CM | POA: Diagnosis not present

## 2017-06-18 DIAGNOSIS — Z888 Allergy status to other drugs, medicaments and biological substances status: Secondary | ICD-10-CM

## 2017-06-18 DIAGNOSIS — I110 Hypertensive heart disease with heart failure: Secondary | ICD-10-CM | POA: Diagnosis present

## 2017-06-18 DIAGNOSIS — Z8673 Personal history of transient ischemic attack (TIA), and cerebral infarction without residual deficits: Secondary | ICD-10-CM | POA: Diagnosis not present

## 2017-06-18 DIAGNOSIS — R0602 Shortness of breath: Secondary | ICD-10-CM | POA: Diagnosis not present

## 2017-06-18 DIAGNOSIS — J449 Chronic obstructive pulmonary disease, unspecified: Secondary | ICD-10-CM | POA: Diagnosis present

## 2017-06-18 DIAGNOSIS — I129 Hypertensive chronic kidney disease with stage 1 through stage 4 chronic kidney disease, or unspecified chronic kidney disease: Secondary | ICD-10-CM | POA: Diagnosis present

## 2017-06-18 DIAGNOSIS — Z87891 Personal history of nicotine dependence: Secondary | ICD-10-CM

## 2017-06-18 DIAGNOSIS — R112 Nausea with vomiting, unspecified: Secondary | ICD-10-CM

## 2017-06-18 LAB — CBC
HCT: 47.3 % (ref 40.0–52.0)
Hemoglobin: 15.7 g/dL (ref 13.0–18.0)
MCH: 29.6 pg (ref 26.0–34.0)
MCHC: 33.2 g/dL (ref 32.0–36.0)
MCV: 89 fL (ref 80.0–100.0)
Platelets: 156 10*3/uL (ref 150–440)
RBC: 5.32 MIL/uL (ref 4.40–5.90)
RDW: 15.2 % — AB (ref 11.5–14.5)
WBC: 7.6 10*3/uL (ref 3.8–10.6)

## 2017-06-18 LAB — URINALYSIS, ROUTINE W REFLEX MICROSCOPIC
BILIRUBIN URINE: NEGATIVE
Glucose, UA: NEGATIVE mg/dL
KETONES UR: NEGATIVE mg/dL
Leukocytes, UA: NEGATIVE
NITRITE: NEGATIVE
PH: 6 (ref 5.0–8.0)
Protein, ur: 100 mg/dL — AB
Specific Gravity, Urine: 1.015 (ref 1.005–1.030)

## 2017-06-18 LAB — HEPATIC FUNCTION PANEL
ALBUMIN: 3.5 g/dL (ref 3.5–5.0)
ALT: 26 U/L (ref 17–63)
AST: 27 U/L (ref 15–41)
Alkaline Phosphatase: 56 U/L (ref 38–126)
BILIRUBIN INDIRECT: 1 mg/dL — AB (ref 0.3–0.9)
BILIRUBIN TOTAL: 1.2 mg/dL (ref 0.3–1.2)
Bilirubin, Direct: 0.2 mg/dL (ref 0.1–0.5)
Total Protein: 6.5 g/dL (ref 6.5–8.1)

## 2017-06-18 LAB — URINALYSIS, MICROSCOPIC (REFLEX)
Bacteria, UA: NONE SEEN
SQUAMOUS EPITHELIAL / LPF: NONE SEEN

## 2017-06-18 LAB — TROPONIN I: TROPONIN I: 0.07 ng/mL — AB (ref <0.03)

## 2017-06-18 LAB — BASIC METABOLIC PANEL
ANION GAP: 10 (ref 5–15)
BUN: 17 mg/dL (ref 6–20)
CALCIUM: 8.8 mg/dL — AB (ref 8.9–10.3)
CO2: 23 mmol/L (ref 22–32)
CREATININE: 1.09 mg/dL (ref 0.61–1.24)
Chloride: 104 mmol/L (ref 101–111)
Glucose, Bld: 115 mg/dL — ABNORMAL HIGH (ref 65–99)
Potassium: 3.4 mmol/L — ABNORMAL LOW (ref 3.5–5.1)
Sodium: 137 mmol/L (ref 135–145)

## 2017-06-18 LAB — BRAIN NATRIURETIC PEPTIDE: B Natriuretic Peptide: 2150 pg/mL — ABNORMAL HIGH (ref 0.0–100.0)

## 2017-06-18 LAB — LIPASE, BLOOD: LIPASE: 19 U/L (ref 11–51)

## 2017-06-18 MED ORDER — ONDANSETRON HCL 4 MG/2ML IJ SOLN
INTRAMUSCULAR | Status: AC
Start: 2017-06-18 — End: 2017-06-18
  Administered 2017-06-18: 4 mg via INTRAVENOUS
  Filled 2017-06-18: qty 2

## 2017-06-18 MED ORDER — FUROSEMIDE 10 MG/ML IJ SOLN
60.0000 mg | Freq: Once | INTRAMUSCULAR | Status: AC
  Administered 2017-06-18: 60 mg via INTRAVENOUS

## 2017-06-18 MED ORDER — FUROSEMIDE 10 MG/ML IJ SOLN
INTRAMUSCULAR | Status: AC
  Administered 2017-06-18: 60 mg via INTRAVENOUS
  Filled 2017-06-18: qty 8

## 2017-06-18 MED ORDER — HYDROCOD POLST-CPM POLST ER 10-8 MG/5ML PO SUER
ORAL | Status: AC
  Administered 2017-06-18: 5 mL via ORAL
  Filled 2017-06-18: qty 5

## 2017-06-18 MED ORDER — ASPIRIN 81 MG PO CHEW
CHEWABLE_TABLET | ORAL | Status: AC
  Filled 2017-06-18: qty 4

## 2017-06-18 MED ORDER — ASPIRIN 81 MG PO CHEW
324.0000 mg | CHEWABLE_TABLET | Freq: Once | ORAL | Status: AC
  Administered 2017-06-18: 324 mg via ORAL

## 2017-06-18 MED ORDER — SODIUM CHLORIDE 0.9 % IV SOLN
1.0000 g | Freq: Once | INTRAVENOUS | Status: AC
  Administered 2017-06-19: 1 g via INTRAVENOUS
  Filled 2017-06-18: qty 10

## 2017-06-18 MED ORDER — HYDROCOD POLST-CPM POLST ER 10-8 MG/5ML PO SUER
5.0000 mL | Freq: Once | ORAL | Status: AC
  Administered 2017-06-18: 5 mL via ORAL

## 2017-06-18 MED ORDER — ONDANSETRON HCL 4 MG/2ML IJ SOLN
4.0000 mg | Freq: Once | INTRAMUSCULAR | Status: AC
  Administered 2017-06-18: 4 mg via INTRAVENOUS

## 2017-06-18 NOTE — ED Provider Notes (Addendum)
[facility]    CSN: [id number] Arrival date & time: 06/18/17  1657     History   Chief Complaint Chief Complaint  Patient presents with  . Cough    HPI Stephen [name] is a 51 y.o. male.   Patient's here because what he felt was a sinus infection. He is a 51 year old black male with a diagnosis of CHF and according to his chart ejection fraction 25%. He's had a Madaline Savage has history of hypertension as well. He was hospitalized in May and is out-of-hospital before [facility]. He is unable to say exactly when the shortness of breath started this time. His element of denial or minimization of his symptoms when asked Stephen [name] questions. He admits that he can't walk but a few feet before he gets short of breath but feels the shortness of breath is coming from sinus congestion and bronchitis. When asked whether he was having bigeminy when he was hospitalized in May is unable to really say for sure and then finally he does say that he thought that he heard that term but was able to tell me whether bigeminy occur when he got to the hospital when he was discharged. He says swelling his legs but can't really tell us how long this had swelling and swelling really has gotten worse. He's had knee surgery and vasectomy. Patient used to smoke but does not smoke anymore. He is allergic to lisinopril.   The history is provided by the patient. No language interpreter was used.  Cough  Cough characteristics:  Non-productive Sputum characteristics:  Frothy Severity:  Moderate Timing:  Constant Progression:  Waxing and waning Chronicity:  New Smoker: no   Context: upper respiratory infection   Relieved by:  Nothing Worsened by:  Nothing Ineffective treatments:  None tried Associated symptoms: shortness of breath and sinus congestion   Associated symptoms: no chest pain     Past Medical History:  Diagnosis Date  . CHF (congestive heart failure) (HCC)    nonischemic cardiomyopathy, EF  25%  . Hypertension   . TIA (transient ischemic attack) 06/21/16    Patient Active Problem List   Diagnosis Date Noted  . CHF exacerbation ([place]) 05/20/2017  . TIA (transient ischemic attack) 06/22/2016  . HTN (hypertension) 06/22/2016  . CHF (congestive heart failure) ([place]) 06/04/2016    Past Surgical History:  Procedure Laterality Date  . KNEE SURGERY Right   . VASECTOMY         Home Medications    Prior to Admission medications   Medication Sig Start Date End Date Taking? Authorizing Provider  albuterol (PROVENTIL HFA;VENTOLIN HFA) 108 (90 Base) MCG/ACT inhaler Inhale 2 puffs into the lungs every 4 (four) hours as needed for wheezing or shortness of breath. 03/22/17  Yes [name], MD  atorvastatin (LIPITOR) 40 MG tablet Take 1 tablet (40 mg total) by mouth daily at 6 PM. Patient taking differently: Take 40 mg by mouth daily.  06/22/16  Yes [name], MD  carvedilol (COREG) 6.25 MG tablet Take 6.25 mg by mouth 2 (two) times daily. 06/20/16  Yes [provider]  clopidogrel (PLAVIX) 75 MG tablet Take 1 tablet (75 mg total) by mouth daily. 06/22/16  Yes [name], MD  furosemide (LASIX) 40 MG tablet Take 1 tablet (40 mg total) by mouth 2 (two) times daily. 05/21/17  Yes [name], MD  Multiple Vitamin (MULTIVITAMIN WITH MINERALS) TABS tablet Take 1 tablet by mouth daily.   Yes [provider]  potassium chloride 20 MEQ TBCR Take 20 mEq by mouth daily. 05/21/17  Yes [name], MD  sacubitril-valsartan (ENTRESTO) 49-51 MG Take 1 tablet by mouth 2 (two) times daily. Discontinue current valsartan (diovan) 09/25/16  Yes [name] A, FNP  varenicline (CHANTIX) 0.5 MG tablet Take 0.5 mg by mouth 2 (two) times daily.   Yes [provider]    Family History Family History  Problem Relation Age of Onset  . Hypertension Mother   . Heart failure Mother   . Hypertension Father   . CAD Father     Social History Social  History  Substance Use Topics  . Smoking status: Former Smoker    Types: Cigarettes    Quit date: 02/20/2016  . Smokeless tobacco: Never Used  . Alcohol use 0.0 oz/week     Comment: beer occasional     Allergies   Lisinopril   Review of Systems Review of Systems  Respiratory: Positive for cough and shortness of breath.   Cardiovascular: Positive for leg swelling. Negative for chest pain.  Musculoskeletal: Positive for joint swelling.  All other systems reviewed and are negative.    Physical Exam Triage Vital Signs ED Triage Vitals  Enc Vitals Group     BP 06/18/17 1758 (!) 136/95     Pulse Rate 06/18/17 1758 (!) 46     Resp 06/18/17 1758 (!) 27     Temp 06/18/17 1758 100.1 F (37.8 C)     Temp Source 06/18/17 1758 Oral     SpO2 06/18/17 1758 97 %     Weight 06/18/17 1757 236 lb (107 kg)     Height 06/18/17 1757 6\' 1"  (1.854 m)     Head Circumference --      Peak Flow --      Pain Score 06/18/17 1757 8     Pain Loc --      Pain Edu? --      Excl. in [place]? --    No data found.   Updated Vital Signs BP (!) 136/95 (BP Location: Left Arm)   Pulse (!) 46   Temp 100.1 F (37.8 C) (Oral)   Resp (!) 27   Ht 6\' 1"  (1.854 m)   Wt 236 lb (107 kg)   SpO2 97%   BMI 31.14 kg/m   Visual Acuity Right Eye Distance:   Left Eye Distance:   Bilateral Distance:    Right Eye Near:   Left Eye Near:    Bilateral Near:     Physical Exam  Constitutional: He is oriented to person, place, and time. He appears well-developed and well-nourished.  HENT:  Head: Normocephalic and atraumatic.  Eyes: EOM are normal. Pupils are equal, round, and reactive to light.  Neck: Normal range of motion. Neck supple.  Cardiovascular: S1 normal and S2 normal.  An irregularly irregular rhythm present. Tachycardia present.  Exam reveals gallop and S3.   Patient pulse ox initially was recorded in the 40s however with EKG was done that showed the patient was in bigeminy and his pulse rate was  actually in the day 3 of watching the monitor pulse rate 110 at times. Oxygenation is 97%.  Pulmonary/Chest: He has wheezes.  Musculoskeletal: Normal range of motion. He exhibits edema. He exhibits no deformity.  He has 2+ edema in both legs  Lymphadenopathy:    He has no cervical adenopathy.  Neurological: He is alert and oriented to person, place, and time. No cranial nerve deficit.  Skin: Skin  is warm.  Psychiatric: He exhibits abnormal recent memory.  Vitals reviewed.    UC Treatments / Results  Labs (all labs ordered are listed, but only abnormal results are displayed) Labs Reviewed - No data to display  EKG  EKG Interpretation None      ED ECG REPORT I, Kavontae Pritchard H, the attending physician, personally viewed and interpreted this ECG.   Date: 06/18/2017  EKG Time: 18:03:59  Rate: 93  Rhythm: there are no previous tracings available for comparison, RBBB, left axis deviation, Biatrial enlargement left axis deviation and left ventricular hypertrophy with QRS widening and repolarization abnormality bigeminy is also present  Axis: Left axis deviation  Intervals:right bundle branch block  ST&T Change: Nonspecific ST changes  Radiology No results found.  Procedures Procedures (including critical care time)  Medications Ordered in UC Medications - No data to display   Initial Impression / Assessment and Plan / UC Course  I have reviewed the triage vital signs and the nursing notes.  Pertinent labs & imaging results that were available during my care of the patient were reviewed by me and considered in my medical decision making (see chart for details).   while he has low-grade temperature don't feel a clicking nor fact that he is in bigeminy we do not know for sure before he's had a rapid heart rate increased progressive shortness of breath and marked edema of both legs. This could be simple bronchitis but with his history felt he should be on a monitor he started  any new cardiac enzymes chest x-ray and may need other modality treatment. Rather than start workup here now and then transferred the hospital will proceed to transfer him to the hospital. Offered transfer him by EMS he declined he wants his significant other to take them and will allow him to do that. Nurse Mickel Baas Niece was going to contact [facility] charge nurse and let them know of his pending arrival.    Final Clinical Impressions(s) / UC Diagnoses   Final diagnoses:  Acute on chronic congestive heart failure, unspecified heart failure type [facility])    New Prescriptions Discharge Medication List as of 06/18/2017  6:21 PM      Note: This dictation was prepared with Dragon dictation along with smaller phrase technology. Any transcriptional errors that result from this process are unintentional.   [name], MD 06/18/17 Randol Kern    [name], MD 06/18/17 1921

## 2017-06-18 NOTE — ED Notes (Signed)
Placed patient on 2.5 L of O2 due to his O2 being 85% on room air. Patient states he has sleep apnea when he is really tired.

## 2017-06-18 NOTE — ED Notes (Signed)
Critical Lab result of Troponin 0.07, per lab. MD made aware.

## 2017-06-18 NOTE — H&P (Signed)
[facility] [place] NAME: Stephen [name]    MR#:  [id number]  DATE OF BIRTH:  12-Feb-1966  DATE OF ADMISSION:  06/18/2017  PRIMARY CARE PHYSICIAN: [name], MD   REQUESTING/REFERRING PHYSICIAN: Kerman Passey, MD  CHIEF COMPLAINT:   Chief Complaint  Patient presents with  . Cough    HISTORY OF PRESENT ILLNESS:  Stephen [name]  is a 51 y.o. male who presents with Persisting cough. Here in the ED he was found to have likely CHF exacerbation. He has a history of chronic systolic CHF with latest EF 20-25%. He and family state that he likes to barbecue a lot with a wood-burning barbecue, and that oftentimes when he does so the smoke will make him cough significantly. He does have a significant smoking history, but has never been diagnosed officially with lung disease. Chest x-ray here was consistent with CHF, BNP was elevated. Hospitalists were called for admission  PAST MEDICAL HISTORY:   Past Medical History:  Diagnosis Date  . CHF (congestive heart failure) (HCC)    nonischemic cardiomyopathy, EF 25%  . Hypertension   . TIA (transient ischemic attack) 06/21/16    PAST SURGICAL HISTORY:   Past Surgical History:  Procedure Laterality Date  . KNEE SURGERY Right   . VASECTOMY      SOCIAL HISTORY:   Social History  Substance Use Topics  . Smoking status: Former Smoker    Types: Cigarettes    Quit date: 02/20/2016  . Smokeless tobacco: Never Used  . Alcohol use 0.0 oz/week     Comment: beer occasional    FAMILY HISTORY:   Family History  Problem Relation Age of Onset  . Hypertension Mother   . Heart failure Mother   . Hypertension Father   . CAD Father     DRUG ALLERGIES:   Allergies  Allergen Reactions  . Lisinopril Cough    MEDICATIONS AT HOME:   Prior to Admission medications   Medication Sig Start Date End Date Taking? Authorizing Provider  albuterol (PROVENTIL HFA;VENTOLIN HFA) 108 (90 Base)  MCG/ACT inhaler Inhale 2 puffs into the lungs every 4 (four) hours as needed for wheezing or shortness of breath. 03/22/17  Yes [name], MD  atorvastatin (LIPITOR) 40 MG tablet Take 1 tablet (40 mg total) by mouth daily at 6 PM. Patient taking differently: Take 40 mg by mouth daily.  06/22/16  Yes [name], MD  carvedilol (COREG) 6.25 MG tablet Take 6.25 mg by mouth 2 (two) times daily. 06/20/16  Yes [provider]  clopidogrel (PLAVIX) 75 MG tablet Take 1 tablet (75 mg total) by mouth daily. 06/22/16  Yes [name], MD  furosemide (LASIX) 40 MG tablet Take 1 tablet (40 mg total) by mouth 2 (two) times daily. 05/21/17  Yes [name], MD  Multiple Vitamin (MULTIVITAMIN WITH MINERALS) TABS tablet Take 1 tablet by mouth daily.   Yes [provider]  potassium chloride 20 MEQ TBCR Take 20 mEq by mouth daily. 05/21/17  Yes [name], MD  sacubitril-valsartan (ENTRESTO) 49-51 MG Take 1 tablet by mouth 2 (two) times daily. Discontinue current valsartan (diovan) 09/25/16  Yes [name] A, FNP  varenicline (CHANTIX) 0.5 MG tablet Take 0.5 mg by mouth 2 (two) times daily.   Yes [provider]    REVIEW OF SYSTEMS:  Review of Systems  Constitutional: Negative for chills, fever, malaise/fatigue and weight loss.  HENT: Negative for ear pain, hearing loss and tinnitus.  Eyes: Negative for blurred vision, double vision, pain and redness.  Respiratory: Positive for cough and shortness of breath. Negative for hemoptysis.   Cardiovascular: Positive for leg swelling. Negative for chest pain, palpitations and orthopnea.  Gastrointestinal: Negative for abdominal pain, constipation, diarrhea, nausea and vomiting.  Genitourinary: Negative for dysuria, frequency and hematuria.  Musculoskeletal: Negative for back pain, joint pain and neck pain.  Skin:       No acne, rash, or lesions  Neurological: Negative for dizziness, tremors, focal weakness and  weakness.  Endo/Heme/Allergies: Negative for polydipsia. Does not bruise/bleed easily.  Psychiatric/Behavioral: Negative for depression. The patient is not nervous/anxious and does not have insomnia.      VITAL SIGNS:   Vitals:   06/18/17 1908 06/18/17 1910 06/18/17 2103  BP: (!) 147/82  (!) 152/85  Pulse: 95 (!) 47 (!) 46  Resp: 20  (!) 26  Temp: (!) 100.8 F (38.2 C)    TempSrc: Oral    SpO2: 98%  96%   Wt Readings from Last 3 Encounters:  06/18/17 107 kg (236 lb)  05/20/17 106.1 kg (234 lb)  03/22/17 110.7 kg (244 lb)    PHYSICAL EXAMINATION:  Physical Exam  Vitals reviewed. Constitutional: He is oriented to person, place, and time. He appears well-developed and well-nourished. No distress.  HENT:  Head: Normocephalic and atraumatic.  Mouth/Throat: Oropharynx is clear and moist.  Eyes: Conjunctivae and EOM are normal. Pupils are equal, round, and reactive to light. No scleral icterus.  Neck: Normal range of motion. Neck supple. No JVD present. No thyromegaly present.  Cardiovascular: Normal rate and intact distal pulses.  Exam reveals no gallop and no friction rub.   No murmur heard. Frequent PVCs to bigeminy at times  Respiratory: Effort normal. No respiratory distress. He has no wheezes. He has no rales.  Bibasilar crackles  GI: Soft. Bowel sounds are normal. He exhibits no distension. There is no tenderness.  Musculoskeletal: Normal range of motion. He exhibits edema.  No arthritis, no gout  Lymphadenopathy:    He has no cervical adenopathy.  Neurological: He is alert and oriented to person, place, and time. No cranial nerve deficit.  No dysarthria, no aphasia  Skin: Skin is warm and dry. No rash noted. No erythema.  Psychiatric: He has a normal mood and affect. His behavior is normal. Judgment and thought content normal.    LABORATORY PANEL:   CBC  Recent Labs Lab 06/18/17 1914  WBC 7.6  HGB 15.7  HCT 47.3  PLT 156    ------------------------------------------------------------------------------------------------------------------  Chemistries   Recent Labs Lab 06/18/17 1914  NA 137  K 3.4*  CL 104  CO2 23  GLUCOSE 115*  BUN 17  CREATININE 1.09  CALCIUM 8.8*  AST 27  ALT 26  ALKPHOS 56  BILITOT 1.2   ------------------------------------------------------------------------------------------------------------------  Cardiac Enzymes  Recent Labs Lab 06/18/17 1914  TROPONINI 0.07*   ------------------------------------------------------------------------------------------------------------------  RADIOLOGY:  Dg Chest 2 View  Result Date: 06/18/2017 CLINICAL DATA:  51 y/o  M; worsening cough. EXAM: CHEST  2 VIEW COMPARISON:  05/20/2017 chest radiograph FINDINGS: Stable cardiomegaly. Prominent interstitial markings probably represents mild interstitial edema. No consolidation or effusion. Bones are unremarkable. IMPRESSION: Cardiomegaly and mild interstitial edema. No consolidation or effusion. Electronically Signed   By: Kristine Garbe M.D.   On: 06/18/2017 19:33    EKG:   Orders placed or performed during the hospital encounter of 06/18/17  . ED EKG  . ED EKG    IMPRESSION AND  PLAN:  Principal Problem:   Acute on chronic systolic CHF (congestive heart failure) (HCC) - IV Lasix given in the ED, we will give him another dose later on in the morning. We'll get a cardiology consult. Will not order echocardiogram at this time as his last echo was only a month ago, will defer to cardiology's recommendations in that matter. Active Problems:   HTN (hypertension) - continue home meds  All the records are reviewed and case discussed with ED provider. Management plans discussed with the patient and/or family.  DVT PROPHYLAXIS: SubQ lovenox  GI PROPHYLAXIS: None  ADMISSION STATUS: Inpatient  CODE STATUS: Full Code Status History    Date Active Date Inactive Code Status  Order ID Comments User Context   05/20/2017 12:26 PM 05/21/2017  5:59 PM Full Code [id number]  [name], MD Inpatient   06/22/2016  4:30 AM 06/22/2016  9:50 PM Full Code [id number]  Quintella Baton, MD Inpatient   06/04/2016  6:43 PM 06/06/2016  6:15 PM Full Code [id number]  [name], MD Inpatient      TOTAL TIME TAKING CARE OF THIS PATIENT: 45 minutes.   Stephen [name] 06/18/2017, 11:29 PM  [facility]  Office  [phone]  CC: Primary care physician; [name], MD  Note:  This document was prepared using Dragon voice recognition software and may include unintentional dictation errors.

## 2017-06-18 NOTE — ED Provider Notes (Signed)
[facility] Emergency Department Provider Note  Time seen: 7:53 PM  I have reviewed the triage vital signs and the nursing notes.   HISTORY  Chief Complaint Cough    HPI Stephen [name] is a 51 y.o. male with a past medical history of CHF, hypertension, TIA, presents to the emergency department for cough and shortness of breath. According to the patient for the past 3 months he has had a fairly constant cough with white sputum production. States shortness of breath especially with exertion. Denies any chest pain. He does state mild increase in leg swelling but states the leg swelling has been going on for some time. Denies any leg pain. Denies any nausea or diaphoresis. Patient went to med in urgent care for evaluation but was referred to the emergency department for further workup.  Past Medical History:  Diagnosis Date  . CHF (congestive heart failure) (HCC)    nonischemic cardiomyopathy, EF 25%  . Hypertension   . TIA (transient ischemic attack) 06/21/16    Patient Active Problem List   Diagnosis Date Noted  . CHF exacerbation ([place]) 05/20/2017  . TIA (transient ischemic attack) 06/22/2016  . HTN (hypertension) 06/22/2016  . CHF (congestive heart failure) ([place]) 06/04/2016    Past Surgical History:  Procedure Laterality Date  . KNEE SURGERY Right   . VASECTOMY      Prior to Admission medications   Medication Sig Start Date End Date Taking? Authorizing Provider  albuterol (PROVENTIL HFA;VENTOLIN HFA) 108 (90 Base) MCG/ACT inhaler Inhale 2 puffs into the lungs every 4 (four) hours as needed for wheezing or shortness of breath. 03/22/17   [name], MD  atorvastatin (LIPITOR) 40 MG tablet Take 1 tablet (40 mg total) by mouth daily at 6 PM. Patient taking differently: Take 40 mg by mouth daily.  06/22/16   [name], MD  carvedilol (COREG) 6.25 MG tablet Take 6.25 mg by mouth 2 (two) times daily. 06/20/16   [provider]   clopidogrel (PLAVIX) 75 MG tablet Take 1 tablet (75 mg total) by mouth daily. 06/22/16   [name], MD  furosemide (LASIX) 40 MG tablet Take 1 tablet (40 mg total) by mouth 2 (two) times daily. 05/21/17   [name], MD  Multiple Vitamin (MULTIVITAMIN WITH MINERALS) TABS tablet Take 1 tablet by mouth daily.    [provider]  potassium chloride 20 MEQ TBCR Take 20 mEq by mouth daily. 05/21/17   [name], MD  sacubitril-valsartan (ENTRESTO) 49-51 MG Take 1 tablet by mouth 2 (two) times daily. Discontinue current valsartan (diovan) 09/25/16   [name], FNP  varenicline (CHANTIX) 0.5 MG tablet Take 0.5 mg by mouth 2 (two) times daily.    [provider]    Allergies  Allergen Reactions  . Lisinopril Cough    Family History  Problem Relation Age of Onset  . Hypertension Mother   . Heart failure Mother   . Hypertension Father   . CAD Father     Social History Social History  Substance Use Topics  . Smoking status: Former Smoker    Types: Cigarettes    Quit date: 02/20/2016  . Smokeless tobacco: Never Used  . Alcohol use 0.0 oz/week     Comment: beer occasional    Review of Systems Constitutional: Negative for fever. Cardiovascular: Negative for chest pain. Respiratory: Positive for shortness of breath with exertion. Positive for cough with white sputum production. Gastrointestinal: Negative for abdominal pain Musculoskeletal: Positive for lower  extremity edema, chronic per patient. Neurological: Negative for headache All other ROS negative  ____________________________________________   PHYSICAL EXAM:  VITAL SIGNS: ED Triage Vitals [06/18/17 1908]  Enc Vitals Group     BP (!) 147/82     Pulse Rate 95     Resp 20     Temp (!) 100.8 F (38.2 C)     Temp Source Oral     SpO2 98 %     Weight      Height      Head Circumference      Peak Flow      Pain Score      Pain Loc      Pain Edu?      Excl. in [place]?      Constitutional: Alert and oriented. Well appearing and in no distress. Eyes: Normal exam ENT   Head: Normocephalic and atraumatic.   Mouth/Throat: Mucous membranes are moist. Cardiovascular: She has frequent ectopic beats, rate around 90 bpm. Respiratory: Normal respiratory effort without tachypnea nor retractions. Breath sounds are clear Gastrointestinal: Soft and nontender. No distention.   Musculoskeletal: Nontender with normal range of motion in all extremities. Pedal to 1+ lower extremity edema equal bilaterally. Nontender. Neurologic:  Normal speech and language. No gross focal neurologic deficits Skin:  Skin is warm, dry and intact.  Psychiatric: Mood and affect are normal.   ____________________________________________    EKG  EKG reviewed and interpreted by myself shows sinus rhythm at 93 bpm with a prolonged PR interval, patient appears to be in bigeminy. No concerning ST changes identified, although interpretation is somewhat difficult with bigeminy.  ____________________________________________    RADIOLOGY  Cardiomegaly with mild interstitial edema.  ____________________________________________   INITIAL IMPRESSION / ASSESSMENT AND PLAN / ED COURSE  Pertinent labs & imaging results that were available during my care of the patient were reviewed by me and considered in my medical decision making (see chart for details).  Patient presents to the emergency department for 3 months of cough with exertional dyspnea. Patient's chest x-ray does show mild interstitial edema. Patient is satting 98% on room air.  patient does have a low-grade fever documented in the emergency room department 100.8. Patient is EKG shows frequent ectopic beat/bigeminy. We will check labs including cardiac enzymes and BNP. We'll discuss with cardiology once results are known. Patient follows up with [name]. Patient takes 80 mg of Lasix daily at baseline. No home O2  requirement.  Patient's labs show an elevated troponin although he does have a mild elevation at baseline. BNP is also elevated. Chest x-ray consistent with interstitial edema. Discussed with Dr.Paraschos, would recommend increasing Lasix. I discussed this with the patient however now he has become nauseated with several episodes of vomiting in the emergency department. Given the patient's bigeminy on EKG, interstitial edema with shortness of breath cough fever and now vomiting I believe the patient would best be served by staying in the hospital for IV diuresis and further workup. Patient is agreeable to this plan.  ____________________________________________   FINAL CLINICAL IMPRESSION(S) / ED DIAGNOSES  CHF exacerbation Fever Nausea and vomiting   Harvest Dark, MD 06/18/17 2051

## 2017-06-18 NOTE — ED Triage Notes (Addendum)
Patient ambulatory to triage with steady gait, without difficulty or distress noted; pt reports sent over by [employer] for prod cough x 3-46months of white/brown sputum; pt denies any accomp symptoms; pt reports was sent over to be eval for CHF; st has had some swelling to LE(s) but is chronic, not new

## 2017-06-18 NOTE — ED Triage Notes (Signed)
Patient complains of cough x 3-4 months. Patient states that he was hospitalized for congestive heart failure last month. Patient states that he has been short of breath with walking. Patient states that he feels congested as well.

## 2017-06-19 LAB — BASIC METABOLIC PANEL
Anion gap: 8 (ref 5–15)
BUN: 18 mg/dL (ref 6–20)
CO2: 26 mmol/L (ref 22–32)
CREATININE: 1.22 mg/dL (ref 0.61–1.24)
Calcium: 8.9 mg/dL (ref 8.9–10.3)
Chloride: 105 mmol/L (ref 101–111)
GFR calc Af Amer: >60 mL/min (ref >60)
Glucose, Bld: 148 mg/dL — ABNORMAL HIGH (ref 65–99)
Potassium: 3.3 mmol/L — ABNORMAL LOW (ref 3.5–5.1)
SODIUM: 139 mmol/L (ref 135–145)

## 2017-06-19 LAB — CBC
HCT: 45.6 % (ref 40.0–52.0)
Hemoglobin: 15.1 g/dL (ref 13.0–18.0)
MCH: 29.3 pg (ref 26.0–34.0)
MCHC: 33.1 g/dL (ref 32.0–36.0)
MCV: 88.5 fL (ref 80.0–100.0)
PLATELETS: 153 10*3/uL (ref 150–440)
RBC: 5.16 MIL/uL (ref 4.40–5.90)
RDW: 15.3 % — ABNORMAL HIGH (ref 11.5–14.5)
WBC: 6.5 10*3/uL (ref 3.8–10.6)

## 2017-06-19 LAB — MAGNESIUM: Magnesium: 1.8 mg/dL (ref 1.7–2.4)

## 2017-06-19 LAB — TROPONIN I
TROPONIN I: 0.06 ng/mL — AB (ref <0.03)
TROPONIN I: 0.06 ng/mL — AB (ref <0.03)
TROPONIN I: 0.06 ng/mL — AB (ref <0.03)

## 2017-06-19 LAB — POTASSIUM: POTASSIUM: 4 mmol/L (ref 3.5–5.1)

## 2017-06-19 MED ORDER — POLYETHYLENE GLYCOL 3350 17 G PO PACK
17.0000 g | PACK | Freq: Every day | ORAL | Status: DC
  Administered 2017-06-19 – 2017-06-20 (×2): 17 g via ORAL
  Filled 2017-06-19 (×2): qty 1

## 2017-06-19 MED ORDER — POTASSIUM CHLORIDE CRYS ER 20 MEQ PO TBCR
40.0000 meq | EXTENDED_RELEASE_TABLET | Freq: Once | ORAL | Status: AC
  Administered 2017-06-19: 40 meq via ORAL
  Filled 2017-06-19: qty 2

## 2017-06-19 MED ORDER — GUAIFENESIN-CODEINE 100-10 MG/5ML PO SOLN
10.0000 mL | Freq: Four times a day (QID) | ORAL | Status: DC | PRN
  Administered 2017-06-19: 10 mL via ORAL
  Filled 2017-06-19: qty 10

## 2017-06-19 MED ORDER — SODIUM CHLORIDE 0.9% FLUSH: 3.0000 mL | INTRAVENOUS | Status: DC | PRN

## 2017-06-19 MED ORDER — ATORVASTATIN CALCIUM 20 MG PO TABS
40.0000 mg | ORAL_TABLET | Freq: Every day | ORAL | Status: DC
  Administered 2017-06-19 – 2017-06-20 (×2): 40 mg via ORAL
  Filled 2017-06-19 (×3): qty 2

## 2017-06-19 MED ORDER — SACUBITRIL-VALSARTAN 49-51 MG PO TABS
1.0000 | ORAL_TABLET | Freq: Two times a day (BID) | ORAL | Status: DC
  Administered 2017-06-19 – 2017-06-20 (×4): 1 via ORAL
  Filled 2017-06-19 (×5): qty 1

## 2017-06-19 MED ORDER — ENOXAPARIN SODIUM 40 MG/0.4ML ~~LOC~~ SOLN
40.0000 mg | SUBCUTANEOUS | Status: DC
  Administered 2017-06-19 – 2017-06-20 (×2): 40 mg via SUBCUTANEOUS
  Filled 2017-06-19 (×2): qty 0.4

## 2017-06-19 MED ORDER — OXYCODONE-ACETAMINOPHEN 5-325 MG PO TABS
1.0000 | ORAL_TABLET | Freq: Four times a day (QID) | ORAL | Status: DC | PRN
  Administered 2017-06-19 – 2017-06-20 (×3): 1 via ORAL
  Filled 2017-06-19 (×3): qty 1

## 2017-06-19 MED ORDER — DOCUSATE SODIUM 100 MG PO CAPS
100.0000 mg | ORAL_CAPSULE | Freq: Every day | ORAL | Status: DC
  Administered 2017-06-20: 100 mg via ORAL
  Filled 2017-06-19: qty 1

## 2017-06-19 MED ORDER — FUROSEMIDE 40 MG PO TABS: 40.0000 mg | ORAL_TABLET | Freq: Two times a day (BID) | ORAL | Status: DC

## 2017-06-19 MED ORDER — FUROSEMIDE 10 MG/ML IJ SOLN
20.0000 mg | Freq: Two times a day (BID) | INTRAMUSCULAR | Status: DC
  Administered 2017-06-19 – 2017-06-20 (×2): 20 mg via INTRAVENOUS
  Filled 2017-06-19 (×3): qty 2

## 2017-06-19 MED ORDER — CARVEDILOL 6.25 MG PO TABS
6.2500 mg | ORAL_TABLET | Freq: Two times a day (BID) | ORAL | Status: DC
  Administered 2017-06-19 – 2017-06-20 (×4): 6.25 mg via ORAL
  Filled 2017-06-19 (×4): qty 1

## 2017-06-19 MED ORDER — FUROSEMIDE 10 MG/ML IJ SOLN
60.0000 mg | Freq: Once | INTRAMUSCULAR | Status: AC
  Administered 2017-06-19: 60 mg via INTRAVENOUS
  Filled 2017-06-19: qty 6

## 2017-06-19 MED ORDER — PREDNISONE 50 MG PO TABS
50.0000 mg | ORAL_TABLET | Freq: Every day | ORAL | Status: DC
  Administered 2017-06-19 – 2017-06-20 (×2): 50 mg via ORAL
  Filled 2017-06-19 (×2): qty 1

## 2017-06-19 MED ORDER — GUAIFENESIN-DM 100-10 MG/5ML PO SYRP
5.0000 mL | ORAL_SOLUTION | ORAL | Status: DC | PRN
  Administered 2017-06-19: 5 mL via ORAL
  Filled 2017-06-19: qty 5

## 2017-06-19 MED ORDER — BUDESONIDE 0.25 MG/2ML IN SUSP
0.2500 mg | Freq: Two times a day (BID) | RESPIRATORY_TRACT | Status: DC
  Administered 2017-06-19 – 2017-06-20 (×3): 0.25 mg via RESPIRATORY_TRACT
  Filled 2017-06-19 (×3): qty 2

## 2017-06-19 MED ORDER — ACETAMINOPHEN 325 MG PO TABS: 650.0000 mg | ORAL_TABLET | Freq: Four times a day (QID) | ORAL | Status: DC | PRN

## 2017-06-19 MED ORDER — AZITHROMYCIN 250 MG PO TABS
500.0000 mg | ORAL_TABLET | Freq: Every day | ORAL | Status: DC
  Administered 2017-06-19 – 2017-06-20 (×2): 500 mg via ORAL
  Filled 2017-06-19 (×2): qty 2

## 2017-06-19 MED ORDER — ONDANSETRON HCL 4 MG/2ML IJ SOLN: 4.0000 mg | Freq: Four times a day (QID) | INTRAMUSCULAR | Status: DC | PRN

## 2017-06-19 MED ORDER — CLOPIDOGREL BISULFATE 75 MG PO TABS
75.0000 mg | ORAL_TABLET | Freq: Every day | ORAL | Status: DC
  Administered 2017-06-19 – 2017-06-20 (×2): 75 mg via ORAL
  Filled 2017-06-19 (×2): qty 1

## 2017-06-19 MED ORDER — IPRATROPIUM-ALBUTEROL 0.5-2.5 (3) MG/3ML IN SOLN
3.0000 mL | Freq: Four times a day (QID) | RESPIRATORY_TRACT | Status: DC
  Administered 2017-06-19 – 2017-06-20 (×5): 3 mL via RESPIRATORY_TRACT
  Filled 2017-06-19 (×5): qty 3

## 2017-06-19 MED ORDER — ACETAMINOPHEN 650 MG RE SUPP: 650.0000 mg | Freq: Four times a day (QID) | RECTAL | Status: DC | PRN

## 2017-06-19 MED ORDER — ONDANSETRON HCL 4 MG PO TABS: 4.0000 mg | ORAL_TABLET | Freq: Four times a day (QID) | ORAL | Status: DC | PRN

## 2017-06-19 NOTE — Progress Notes (Signed)
Notified by CCMD that patient has been having runs of Vtach and wide QRS complexes. Dr. Posey Pronto notified orders for mag and k+ labs ordered. Will continue to monitor.

## 2017-06-19 NOTE — Progress Notes (Signed)
Patient was transferred from the ED following c/o SOB and progressive cough. On admission to the unit, patient was A&O X4, ambulatory  And on acute supplemental oxygen, denied pain or being in any discomfort.Admission documentation was completed with patient. VS WDL for patient, NSR patient was weaned to room air.. Patient rested overnight w/o any acute incident.

## 2017-06-19 NOTE — Progress Notes (Signed)
[facility] at [facility]                                                                                                                                                                                  Patient Demographics   Stephen [name], is a 51 y.o. male, DOB - 08/31/1966, ZMO:[id number]  Admit date - 06/18/2017   Admitting Physician Lance Coon, MD  Outpatient Primary MD for the patient is [name], MD   LOS - 1  Subjective: Patient complains of cough shortness of breath Feeling better    Review of Systems:   CONSTITUTIONAL: No documented fever. No fatigue, weakness. No weight gain, no weight loss.  EYES: No blurry or double vision.  ENT: No tinnitus. No postnasal drip. No redness of the oropharynx.  RESPIRATORY: Positive cough, no wheeze, no hemoptysis. Positive dyspnea.  CARDIOVASCULAR: No chest pain. No orthopnea. No palpitations. No syncope.  GASTROINTESTINAL: No nausea, no vomiting or diarrhea. No abdominal pain. No melena or hematochezia.  GENITOURINARY: No dysuria or hematuria.  ENDOCRINE: No polyuria or nocturia. No heat or cold intolerance.  HEMATOLOGY: No anemia. No bruising. No bleeding.  INTEGUMENTARY: No rashes. No lesions.  MUSCULOSKELETAL: No arthritis. No swelling. No gout.  NEUROLOGIC: No numbness, tingling, or ataxia. No seizure-type activity.  PSYCHIATRIC: No anxiety. No insomnia. No ADD.    Vitals:   Vitals:   06/19/17 0007 06/19/17 0046 06/19/17 0416 06/19/17 1328  BP:  126/74 108/78   Pulse: (!) 52 (!) 31 (!) 39   Resp: (!) 22 16 16    Temp:  98.8 F (37.1 C) 98.5 F (36.9 C)   TempSrc:  Oral Oral   SpO2: 96% 95% 93% 96%  Weight:   233 lb 9.6 oz (106 kg)   Height:        Wt Readings from Last 3 Encounters:  06/19/17 233 lb 9.6 oz (106 kg)  06/18/17 236 lb (107 kg)  05/20/17 234 lb (106.1 kg)     Intake/Output Summary (Last 24 hours) at 06/19/17 1439 Last data filed at 06/19/17 1300  Gross per 24  hour  Intake              720 ml  Output              850 ml  Net             -130 ml    Physical Exam:   GENERAL: Pleasant-appearing in no apparent distress.  HEAD, EYES, EARS, NOSE AND THROAT: Atraumatic, normocephalic. Extraocular muscles are intact. Pupils equal and reactive to light. Sclerae anicteric. No conjunctival injection. No oro-pharyngeal erythema.  NECK: Supple. There is  no jugular venous distention. No bruits, no lymphadenopathy, no thyromegaly.  HEART: Regular rate and rhythm,. No murmurs, no rubs, no clicks.  LUNGS: b/l occasion wheezing,  ABDOMEN: Soft, flat, nontender, nondistended. Has good bowel sounds. No hepatosplenomegaly appreciated.  EXTREMITIES: No evidence of any cyanosis, clubbing, or peripheral edema.  +2 pedal and radial pulses bilaterally.  NEUROLOGIC: The patient is alert, awake, and oriented x3 with no focal motor or sensory deficits appreciated bilaterally.  SKIN: Moist and warm with no rashes appreciated.  Psych: Not anxious, depressed LN: No inguinal LN enlargement    Antibiotics   Anti-infectives    Start     Dose/Rate Route Frequency Ordered Stop   06/19/17 1000  azithromycin (ZITHROMAX) tablet 500 mg     500 mg Oral Daily 06/19/17 0832        Medications   Scheduled Meds: . atorvastatin  40 mg Oral Daily  . azithromycin  500 mg Oral Daily  . budesonide (PULMICORT) nebulizer solution  0.25 mg Nebulization BID  . carvedilol  6.25 mg Oral BID  . clopidogrel  75 mg Oral Daily  . enoxaparin (LOVENOX) injection  40 mg Subcutaneous Q24H  . furosemide  20 mg Intravenous BID  . ipratropium-albuterol  3 mL Nebulization Q6H  . predniSONE  50 mg Oral Q breakfast  . sacubitril-valsartan  1 tablet Oral BID   Continuous Infusions: PRN Meds:.acetaminophen **OR** acetaminophen, guaiFENesin-codeine, ondansetron **OR** ondansetron (ZOFRAN) IV, oxyCODONE-acetaminophen   Data Review:   Micro Results No results found for this or any previous visit  (from the past 240 hour(s)).  Radiology Reports Dg Chest 2 View  Result Date: 06/18/2017 CLINICAL DATA:  51 y/o  M; worsening cough. EXAM: CHEST  2 VIEW COMPARISON:  05/20/2017 chest radiograph FINDINGS: Stable cardiomegaly. Prominent interstitial markings probably represents mild interstitial edema. No consolidation or effusion. Bones are unremarkable. IMPRESSION: Cardiomegaly and mild interstitial edema. No consolidation or effusion. Electronically Signed   By: Kristine Garbe M.D.   On: 06/18/2017 19:33     CBC  Recent Labs Lab 06/18/17 1914 06/19/17 0043  WBC 7.6 6.5  HGB 15.7 15.1  HCT 47.3 45.6  PLT 156 153  MCV 89.0 88.5  MCH 29.6 29.3  MCHC 33.2 33.1  RDW 15.2* 15.3*    Chemistries   Recent Labs Lab 06/18/17 1914 06/19/17 0043  NA 137 139  K 3.4* 3.3*  CL 104 105  CO2 23 26  GLUCOSE 115* 148*  BUN 17 18  CREATININE 1.09 1.22  CALCIUM 8.8* 8.9  AST 27  --   ALT 26  --   ALKPHOS 56  --   BILITOT 1.2  --    ------------------------------------------------------------------------------------------------------------------ estimated creatinine clearance is 91.9 mL/min (by C-G formula based on SCr of 1.22 mg/dL). ------------------------------------------------------------------------------------------------------------------ No results for input(s): HGBA1C in the last 72 hours. ------------------------------------------------------------------------------------------------------------------ No results for input(s): CHOL, HDL, LDLCALC, TRIG, CHOLHDL, LDLDIRECT in the last 72 hours. ------------------------------------------------------------------------------------------------------------------ No results for input(s): TSH, T4TOTAL, T3FREE, THYROIDAB in the last 72 hours.  Invalid input(s): FREET3 ------------------------------------------------------------------------------------------------------------------ No results for input(s): VITAMINB12,  FOLATE, FERRITIN, TIBC, IRON, RETICCTPCT in the last 72 hours.  Coagulation profile No results for input(s): INR, PROTIME in the last 168 hours.  No results for input(s): DDIMER in the last 72 hours.  Cardiac Enzymes  Recent Labs Lab 06/19/17 0043 06/19/17 0624 06/19/17 1241  TROPONINI 0.06* 0.06* 0.06*   ------------------------------------------------------------------------------------------------------------------ Invalid input(s): POCBNP    Assessment & Plan  Pt is 51 y.o with chf presents  with sob  1.   Acute on chronic systolic CHF (congestive heart failure) ([place]) - pt was on oral lasix, Give IV lasix  2.   HTN (hypertension) -  continue Coreg and entresto  3. Possible bronchitis with bronchospasm: We'll treat with nebulizers, Solu-Medrol and antitussives      Code Status Orders        Start     Ordered   06/19/17 0028  Full code  Continuous     06/19/17 0027    Code Status History    Date Active Date Inactive Code Status Order ID Comments User Context   05/20/2017 12:26 PM 05/21/2017  5:59 PM Full Code [id number]  [name], MD Inpatient   06/22/2016  4:30 AM 06/22/2016  9:50 PM Full Code [id number]  Quintella Baton, MD Inpatient   06/04/2016  6:43 PM 06/06/2016  6:15 PM Full Code [id number]  [name], MD Inpatient           Consults  none  DVT Prophylaxis  Lovenox    Lab Results  Component Value Date   PLT 153 06/19/2017     Time Spent in minutes   64min  Greater than 50% of time spent in care coordination and counseling patient regarding the condition and plan of care.   [name] M.D on 06/19/2017 at 2:39 PM  Between 7am to 6pm - Pager - [phone]  After 6pm go to [email or web address] - password [email or web address] [facility]   Office  [phone]

## 2017-06-19 NOTE — Progress Notes (Signed)
Patient having several runs of irregular rhythms Dr. Saralyn Pilar notified. Stated he doesn't think it is Production manager. No orders received. Patient is asymptomatic will continue to monitor.

## 2017-06-19 NOTE — Progress Notes (Signed)
Chaplain, on rounds, visited with patient in room 235. [place] provided pastoral care and spiritual presence.    06/19/17 1130  Clinical Encounter Type  Visited With Patient  Visit Type Initial;Spiritual support  Referral From Chaplain  Consult/Referral To Chaplain  Spiritual Encounters  Spiritual Needs Other (Comment)

## 2017-06-19 NOTE — Consult Note (Signed)
[facility] Cardiology  CARDIOLOGY CONSULT NOTE  Patient ID: Stephen [name] MRN: [id number] DOB/AGE: July 09, 1966 51 y.o.  Admit date: 06/18/2017 Referring Physician Stephen [name] Primary Physician Stephen [name] Primary Cardiologist Stephen [name] Reason for Consultation Congestive heart failure  HPI: 51 year old gentleman referred for evaluation of congestive heart failure. The patient has known history of nonischemic dilated cardiomyopathy, followed by Dr. Clayborn [name]. Previous 2-D echocardiogram 12/30/2015 revealed dilated current myopathy, with LVEF 25%. ETT sestamibi study 12/27/2015 confirmed LVEF 21% without evidence for scar or ischemia. The patient reports a one to 2 month history of intermittent cough, possibly exacerbated by a wood burning grill. He saw his primary care provider yesterday and was referred to [facility] emergency room, treated with intravenous dose of furosemide with overall clinical improvement. Admission labs were notable for borderline elevated troponin of 0.07, with follow-up troponins of 0.06  2, without rise and fall. The patient denies chest pain. He had a similar hospitalization 05/20/2017 at which time troponins were 0.04, 0.04, 0.04. 2-D echocardiogram on 05/21/2017 revealed LVEF of 20-25%. Initial ECG revealed sinus rhythm, with right bundle-branch block, with frequent PVCs, in bigeminal pattern. This looks very similar to most recent ECG from prior admission.  Review of systems complete and found to be negative unless listed above     Past Medical History:  Diagnosis Date  . CHF (congestive heart failure) (HCC)    nonischemic cardiomyopathy, EF 25%  . Hypertension   . TIA (transient ischemic attack) 06/21/16    Past Surgical History:  Procedure Laterality Date  . KNEE SURGERY Right   . VASECTOMY      Prescriptions Prior to Admission  Medication Sig Dispense Refill Last Dose  . albuterol (PROVENTIL HFA;VENTOLIN HFA) 108 (90 Base) MCG/ACT inhaler Inhale 2 puffs into the lungs every 4 (four)  hours as needed for wheezing or shortness of breath. 1 Inhaler 0 PRN at PRN  . atorvastatin (LIPITOR) 40 MG tablet Take 1 tablet (40 mg total) by mouth daily at 6 PM. (Patient taking differently: Take 40 mg by mouth daily. ) 30 tablet 2 06/18/2017 at Unknown time  . carvedilol (COREG) 6.25 MG tablet Take 6.25 mg by mouth 2 (two) times daily.   06/18/2017 at Unknown time  . clopidogrel (PLAVIX) 75 MG tablet Take 1 tablet (75 mg total) by mouth daily. 30 tablet 2 06/18/2017 at Unknown time  . furosemide (LASIX) 40 MG tablet Take 1 tablet (40 mg total) by mouth 2 (two) times daily. 60 tablet 2 06/18/2017 at Unknown time  . Multiple Vitamin (MULTIVITAMIN WITH MINERALS) TABS tablet Take 1 tablet by mouth daily.   06/18/2017 at Unknown time  . potassium chloride 20 MEQ TBCR Take 20 mEq by mouth daily. 30 tablet 2 06/18/2017 at Unknown time  . sacubitril-valsartan (ENTRESTO) 49-51 MG Take 1 tablet by mouth 2 (two) times daily. Discontinue current valsartan (diovan) 60 tablet 5 06/18/2017 at Unknown time  . varenicline (CHANTIX) 0.5 MG tablet Take 0.5 mg by mouth 2 (two) times daily.   06/18/2017 at Unknown time   Social History   Social History  . Marital status: Single    Spouse name: N/A  . Number of children: N/A  . Years of education: N/A   Occupational History  . Not on file.   Social History Main Topics  . Smoking status: Former Smoker    Types: Cigarettes    Quit date: 02/20/2016  . Smokeless tobacco: Never Used  . Alcohol use 0.0 oz/week     Comment: beer occasional  .  Drug use: No  . Sexual activity: Not on file   Other Topics Concern  . Not on file   Social History Narrative   Independent at baseline    Family History  Problem Relation Age of Onset  . Hypertension Mother   . Heart failure Mother   . Hypertension Father   . CAD Father       Review of systems complete and found to be negative unless listed above      PHYSICAL EXAM  General: Well developed, well nourished,  in no acute distress HEENT:  Normocephalic and atramatic Neck:  No JVD.  Lungs: Clear bilaterally to auscultation and percussion. Heart: HRRR . Normal S1 and S2 without gallops or murmurs.  Abdomen: Bowel sounds are positive, abdomen soft and non-tender  Msk:  Back normal, normal gait. Normal strength and tone for age. Extremities: No clubbing, cyanosis or edema.   Neuro: Alert and oriented X 3. Psych:  Good affect, responds appropriately  Labs:   Lab Results  Component Value Date   WBC 6.5 06/19/2017   HGB 15.1 06/19/2017   HCT 45.6 06/19/2017   MCV 88.5 06/19/2017   PLT 153 06/19/2017    Recent Labs Lab 06/18/17 1914 06/19/17 0043  NA 137 139  K 3.4* 3.3*  CL 104 105  CO2 23 26  BUN 17 18  CREATININE 1.09 1.22  CALCIUM 8.8* 8.9  PROT 6.5  --   BILITOT 1.2  --   ALKPHOS 56  --   ALT 26  --   AST 27  --   GLUCOSE 115* 148*   Lab Results  Component Value Date   TROPONINI 0.06 (HH) 06/19/2017    Lab Results  Component Value Date   CHOL 173 06/22/2016   Lab Results  Component Value Date   HDL 36 (L) 06/22/2016   Lab Results  Component Value Date   LDLCALC 124 (H) 06/22/2016   Lab Results  Component Value Date   TRIG 65 06/22/2016   Lab Results  Component Value Date   CHOLHDL 4.8 06/22/2016   No results found for: LDLDIRECT    Radiology: Dg Chest 2 View  Result Date: 06/18/2017 CLINICAL DATA:  51 y/o  M; worsening cough. EXAM: CHEST  2 VIEW COMPARISON:  05/20/2017 chest radiograph FINDINGS: Stable cardiomegaly. Prominent interstitial markings probably represents mild interstitial edema. No consolidation or effusion. Bones are unremarkable. IMPRESSION: Cardiomegaly and mild interstitial edema. No consolidation or effusion. Electronically Signed   By: Stephen [name] M.D.   On: 06/18/2017 19:33    EKG: Normal sinus rhythm, right bundle branch block, frequent PVCs and bigeminal pattern  ASSESSMENT AND PLAN:   1. Acute on chronic systolic  congestive heart failure, possibly exacerbated by wood burning grill, improved after initial diuresis, on appropriate heart failure regimen 2. Frequent PVCs, asymptomatic  Recommendations  1. Continue current therapy 2. Continue current heart failure regimen 3. Consider elective ICD, can discuss further as outpatient with Dr. Clayborn [name] 4. Continue diuresis 5. Carefully monitor renal status 6. No further cardiac diagnostics at this time  Signed: Isaias Cowman MD,PhD, [facility] 06/19/2017, 8:05 AM

## 2017-06-20 MED ORDER — PANTOPRAZOLE SODIUM 40 MG PO TBEC
40.0000 mg | DELAYED_RELEASE_TABLET | Freq: Every day | ORAL | Status: DC
  Administered 2017-06-20: 40 mg via ORAL
  Filled 2017-06-20: qty 1

## 2017-06-20 MED ORDER — FLUTICASONE PROPIONATE 50 MCG/ACT NA SUSP: 2.0000 | Freq: Every day | NASAL | 2 refills | Status: DC

## 2017-06-20 MED ORDER — PANTOPRAZOLE SODIUM 40 MG PO TBEC: 40.0000 mg | DELAYED_RELEASE_TABLET | Freq: Every day | ORAL | 0 refills | Status: DC

## 2017-06-20 MED ORDER — FLUTICASONE PROPIONATE 50 MCG/ACT NA SUSP
2.0000 | Freq: Every day | NASAL | Status: DC
  Administered 2017-06-20: 2 via NASAL
  Filled 2017-06-20: qty 16

## 2017-06-20 MED ORDER — AZITHROMYCIN 250 MG PO TABS: ORAL_TABLET | ORAL | 0 refills | Status: DC

## 2017-06-20 MED ORDER — GUAIFENESIN-CODEINE 100-10 MG/5ML PO SOLN: 10.0000 mL | Freq: Four times a day (QID) | ORAL | 0 refills | Status: DC | PRN

## 2017-06-20 MED ORDER — FLUTICASONE-SALMETEROL 250-50 MCG/DOSE IN AEPB: 1.0000 | INHALATION_SPRAY | Freq: Two times a day (BID) | RESPIRATORY_TRACT | 0 refills | Status: DC

## 2017-06-20 MED ORDER — TIOTROPIUM BROMIDE MONOHYDRATE 18 MCG IN CAPS: 18.0000 ug | ORAL_CAPSULE | Freq: Every day | RESPIRATORY_TRACT | 12 refills | Status: DC

## 2017-06-20 NOTE — Progress Notes (Signed)
[facility] Cardiology  SUBJECTIVE: Patient reports feeling better this morning. He denies chest pain. He does have mild exertional shortness of breath. He denies palpitations, heart racing, lightheadedness or dizziness.   Vitals:   06/20/17 0242 06/20/17 0553 06/20/17 0736 06/20/17 0822  BP:  124/81  118/89  Pulse:  62  67  Resp:  18  20  Temp:  98.2 F (36.8 C)  97.4 F (36.3 C)  TempSrc:  Oral  Oral  SpO2: 96% 95% 98% 96%  Weight:  107.1 kg (236 lb 1.6 oz)    Height:         Intake/Output Summary (Last 24 hours) at 06/20/17 8250 Last data filed at 06/20/17 0640  Gross per 24 hour  Intake              960 ml  Output              800 ml  Net              160 ml      PHYSICAL EXAM  General: Well developed, well nourished, in no acute distress HEENT:  Normocephalic and atramatic Neck:  No JVD.  Lungs: Faint wheezing, no crackles, normal effort of breathing Heart: HRRR . Normal S1 and S2 without gallops or murmurs.  Abdomen: Bowel sounds are positive, abdomen soft and non-tender  Msk:  Back normal, sitting upright in chair Extremities: No clubbing, cyanosis or edema.   Neuro: Alert and oriented X 3. Psych:  Good affect, responds appropriately   LABS: Basic Metabolic Panel:  Recent Labs  06/18/17 1914 06/19/17 0043 06/19/17 1507  NA 137 139  --   K 3.4* 3.3* 4.0  CL 104 105  --   CO2 23 26  --   GLUCOSE 115* 148*  --   BUN 17 18  --   CREATININE 1.09 1.22  --   CALCIUM 8.8* 8.9  --   MG  --   --  1.8   Liver Function Tests:  Recent Labs  06/18/17 1914  AST 27  ALT 26  ALKPHOS 56  BILITOT 1.2  PROT 6.5  ALBUMIN 3.5    Recent Labs  06/18/17 1914  LIPASE 19   CBC:  Recent Labs  06/18/17 1914 06/19/17 0043  WBC 7.6 6.5  HGB 15.7 15.1  HCT 47.3 45.6  MCV 89.0 88.5  PLT 156 153   Cardiac Enzymes:  Recent Labs  06/19/17 0043 06/19/17 0624 06/19/17 1241  TROPONINI 0.06* 0.06* 0.06*   BNP: Invalid input(s): POCBNP D-Dimer: No results  for input(s): DDIMER in the last 72 hours. Hemoglobin A1C: No results for input(s): HGBA1C in the last 72 hours. Fasting Lipid Panel: No results for input(s): CHOL, HDL, LDLCALC, TRIG, CHOLHDL, LDLDIRECT in the last 72 hours. Thyroid Function Tests: No results for input(s): TSH, T4TOTAL, T3FREE, THYROIDAB in the last 72 hours.  Invalid input(s): FREET3 Anemia Panel: No results for input(s): VITAMINB12, FOLATE, FERRITIN, TIBC, IRON, RETICCTPCT in the last 72 hours.  Dg Chest 2 View  Result Date: 06/18/2017 CLINICAL DATA:  51 y/o  M; worsening cough. EXAM: CHEST  2 VIEW COMPARISON:  05/20/2017 chest radiograph FINDINGS: Stable cardiomegaly. Prominent interstitial markings probably represents mild interstitial edema. No consolidation or effusion. Bones are unremarkable. IMPRESSION: Cardiomegaly and mild interstitial edema. No consolidation or effusion. Electronically Signed   By: Kristine Garbe M.D.   On: 06/18/2017 19:33     Echo EF 20-25%  TELEMETRY: Sinus rhythm, rate 77 bpm,  frequent PVCs  ASSESSMENT AND PLAN:  Principal Problem:   Acute on chronic systolic CHF (congestive heart failure) (HCC) Active Problems:   HTN (hypertension)    1. Acute on chronic systolic congestive heart failure, possibly exacerbated by woodburning grill, improved after initial diuresis, on appropriate heart failure regimen. Patient denies chest pain. Occasional shortness of breath with exertion. Not on supplemental oxygen. Clinically improving. 2. Frequent PVCs and episode of wide complex tachycardia, asymptomatic 3. Dilated cardiomyopathy, EF 20-25%  Recommendations: 1. Agree with current therapy 2. Continue Lasix with careful monitoring of renal status 3. Consider elective ICD, can discuss further as outpatient with [name] 4. Continue current heart failure regimen 5. No further cardiac diagnostics at this time 6. Family wishes to be referred to pulmonary as outpatient.  Sign off  for now; call with any questions.  Clabe Seal, PA-C 06/20/2017 9:18 AM

## 2017-06-20 NOTE — Discharge Instructions (Signed)
[facility] [place] [facility] ° °DIET:  °Cardiac diet ° °DISCHARGE CONDITION:  °Stable ° °ACTIVITY:  °Activity as tolerated ° °OXYGEN:  °Home Oxygen: No. °  °Oxygen Delivery: room air ° °DISCHARGE LOCATION:  °home  ° ° °ADDITIONAL DISCHARGE INSTRUCTION: ° ° °If you experience worsening of your admission symptoms, develop shortness of breath, life threatening emergency, suicidal or homicidal thoughts you must seek medical attention immediately by calling 911 or calling your MD immediately  if symptoms less severe. ° °You Must read complete instructions/literature along with all the possible adverse reactions/side effects for all the Medicines you take and that have been prescribed to you. Take any new Medicines after you have completely understood and accpet all the possible adverse reactions/side effects.  ° °Please note ° °You were cared for by a hospitalist during your hospital stay. If you have any questions about your discharge medications or the care you received while you were in the hospital after you are discharged, you can call the unit and asked to speak with the hospitalist on call if the hospitalist that took care of you is not available. Once you are discharged, your primary care physician will handle any further medical issues. Please note that NO REFILLS for any discharge medications will be authorized once you are discharged, as it is imperative that you return to your primary care physician (or establish a relationship with a primary care physician if you do not have one) for your aftercare needs so that they can reassess your need for medications and monitor your lab values. ° ° °

## 2017-06-20 NOTE — Progress Notes (Signed)
Pt was discharged this evening. Iv and tele removed. disch instructions and prescrips given. disch  Via w.c. Accompanied by sister

## 2017-06-20 NOTE — Care Management (Signed)
Referred to CHF clinic.

## 2017-06-20 NOTE — Discharge Summary (Signed)
[facility] [place] [facility], [place] y.o., DOB 02-20-1966, MRN [id number]. 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 male who presents with Persisting cough. Here in the ED he was found to have likely CHF exacerbation. He has a history of chronic systolic CHF with latest EF 20-25%. He and family state that he likes to barbecue a lot with a wood-burning barbecue, and that oftentimes when he does so the smoke will make him cough significantly. Patient presented and was seen in the emergency room and was noted to have CHF he was treated with Lasix. With improvement in his symptoms. He continued complaint of cough. Cough could be related to GERD he also has seasonal allergies so I'm trying PPIs as well as inhalers to help with the symptoms. I will also refer him to pulmonary for further follow-up for his COPD and chronic cough          Consults  cardiology  Significant Tests:  See full reports for all details     Dg Chest 2 View  Result Date: 06/18/2017 CLINICAL DATA:  51 y/o  M; worsening cough. EXAM: CHEST  2 VIEW COMPARISON:  05/20/2017 chest radiograph FINDINGS: Stable cardiomegaly. Prominent interstitial markings probably represents mild interstitial edema. No consolidation or effusion. Bones are unremarkable. IMPRESSION:  Cardiomegaly and mild interstitial edema. No consolidation or effusion. Electronically Signed   By: Kristine Garbe M.D.   On: 06/18/2017 19:33       Today   Subjective:   Stephen [name]  Patient's feeling better has cough which is been chronic.  Objective:   Blood pressure 112/83, pulse 65, temperature 97.5 F (36.4 C), temperature source Oral, resp. rate (!) 22, height 6\' 2"  (1.88 m), weight 236 lb 1.6 oz (107.1 kg), SpO2 98 %.  .  Intake/Output Summary (Last 24 hours) at 06/20/17 1346 Last data filed at 06/20/17 1300  Gross per 24 hour  Intake              840 ml  Output              800 ml  Net               40 ml    Exam VITAL SIGNS: Blood pressure 112/83, pulse 65, temperature 97.5 F (36.4 C), temperature source Oral, resp. rate (!) 22, height 6\' 2"  (1.88 m), weight 236 lb 1.6 oz (107.1 kg), SpO2 98 %.  GENERAL:  51 y.o.-year-old patient lying in the bed with no acute distress.  EYES: Pupils equal, round, reactive to light and accommodation. No scleral icterus. Extraocular muscles intact.  HEENT: Head atraumatic, normocephalic. Oropharynx and nasopharynx clear.  NECK:  Supple, no jugular venous distention. No thyroid enlargement, no tenderness.  LUNGS: Normal breath sounds bilaterally, no wheezing, rales,rhonchi or crepitation. No use of accessory muscles of respiration.  CARDIOVASCULAR: S1, S2 normal. No murmurs, rubs, or gallops.  ABDOMEN: Soft, nontender, nondistended. Bowel sounds present. No organomegaly or mass.  EXTREMITIES: No pedal edema, cyanosis, or clubbing.  NEUROLOGIC: Cranial nerves II through XII are intact. Muscle strength 5/5 in all extremities. Sensation intact. Gait not checked.  PSYCHIATRIC: The patient is alert and oriented x 3.  SKIN: No obvious rash, lesion, or ulcer.   Data Review     CBC w Diff:  Lab Results  Component Value Date   WBC 6.5 06/19/2017   HGB 15.1 06/19/2017   HCT 45.6 06/19/2017   PLT 153 06/19/2017    LYMPHOPCT 26 05/20/2017   MONOPCT 9 05/20/2017   EOSPCT 4 05/20/2017   BASOPCT 1 05/20/2017   CMP:  Lab Results  Component Value Date   NA 139 06/19/2017   K 4.0 06/19/2017   CL 105 06/19/2017   CO2 26 06/19/2017   BUN 18 06/19/2017   CREATININE 1.22 06/19/2017   PROT 6.5 06/18/2017   ALBUMIN 3.5 06/18/2017   BILITOT 1.2 06/18/2017   ALKPHOS 56 06/18/2017   AST 27 06/18/2017   ALT 26 06/18/2017  .  Micro Results No results found for this or any previous visit (from the past 240 hour(s)).      Code Status Orders        Start     Ordered   06/19/17 0028  Full code  Continuous     06/19/17 0027    Code Status History    Date Active Date Inactive Code Status Order ID Comments User Context   05/20/2017 12:26 PM 05/21/2017  5:59 PM Full Code [id number]  [name], MD Inpatient   06/22/2016  4:30 AM 06/22/2016  9:50 PM Full Code [id number]  Quintella Baton, MD Inpatient   06/04/2016  6:43 PM 06/06/2016  6:15 PM Full Code [id number]  [name], MD Inpatient          Follow-up Information    [name], MD In 1 week.   Specialty:  Family Medicine Why:  The office will call you with an appointment date and time. Thanks!       Yolonda Kida, MD. Daphane Shepherd on 07/11/2017.   Specialties:  Cardiology, Internal Medicine Why:  chf f/u, Appointment Time: 11:00am Contact information: [address] [place] [address] [phone]        [name], MD. Go on 07/25/2017.   Specialties:  Pulmonary Disease, Cardiology Why:  copd, cough, , Appointment Time: 10:15am Contact information: [place] [address] [phone]           Discharge Medications   Allergies as of 06/20/2017      Reactions   Lisinopril Cough      Medication List    TAKE these medications   albuterol 108 (90 Base) MCG/ACT inhaler Commonly known as:  PROVENTIL HFA;VENTOLIN HFA Inhale 2 puffs into the  lungs every 4 (four) hours as needed for wheezing or shortness of breath.   atorvastatin 40 MG tablet Commonly known as:  LIPITOR Take 1 tablet (40 mg total) by mouth daily at 6 PM. What changed:  when to take this   azithromycin 250 MG tablet Commonly known as:  ZITHROMAX One tab x 4 days   carvedilol 6.25 MG tablet Commonly known as:  COREG Take 6.25 mg by mouth 2 (two) times daily.   clopidogrel 75 MG tablet Commonly known as:  PLAVIX Take 1  tablet (75 mg total) by mouth daily.   fluticasone 50 MCG/ACT nasal spray Commonly known as:  FLONASE Place 2 sprays into both nostrils daily.   Fluticasone-Salmeterol 250-50 MCG/DOSE Aepb Commonly known as:  ADVAIR DISKUS Inhale 1 puff into the lungs 2 (two) times daily.   furosemide 40 MG tablet Commonly known as:  LASIX Take 1 tablet (40 mg total) by mouth 2 (two) times daily.   guaiFENesin-codeine 100-10 MG/5ML syrup Take 10 mLs by mouth every 6 (six) hours as needed for cough.   multivitamin with minerals Tabs tablet Take 1 tablet by mouth daily.   pantoprazole 40 MG tablet Commonly known as:  PROTONIX Take 1 tablet (40 mg total) by mouth daily.   Potassium Chloride ER 20 MEQ Tbcr Take 20 mEq by mouth daily.   sacubitril-valsartan 49-51 MG Commonly known as:  ENTRESTO Take 1 tablet by mouth 2 (two) times daily. Discontinue current valsartan (diovan)   tiotropium 18 MCG inhalation capsule Commonly known as:  SPIRIVA HANDIHALER Place 1 capsule (18 mcg total) into inhaler and inhale daily.   varenicline 0.5 MG tablet Commonly known as:  CHANTIX Take 0.5 mg by mouth 2 (two) times daily.          Total Time in preparing paper work, data evaluation and todays exam - 35 minutes  [name] M.D on 06/20/2017 at [place] PM  [facility]   Office  [phone]

## 2017-06-21 IMAGING — CT CT ABD-PELV W/ CM
1 of 3 series · 13 of 32 positions shown, 18 images · IV contrast (iopamidol)
Comparison: None.

CLINICAL DATA: Abdominal pain.  Right flank pain.

EXAM:
CT ABDOMEN AND PELVIS WITH CONTRAST
TECHNIQUE: Multidetector CT imaging of the abdomen and pelvis was performed
using the standard protocol following bolus administration of
intravenous contrast.
CONTRAST:  100mL MBB1BH-C11 IOPAMIDOL (MBB1BH-C11) INJECTION 61%

[Series 2: routine abd pel with · axial · 0.77mm/px · z∈[-693,-293]mm · 13 of 90 slices shown, 18 images]
[im 5/90  soft-tissue]
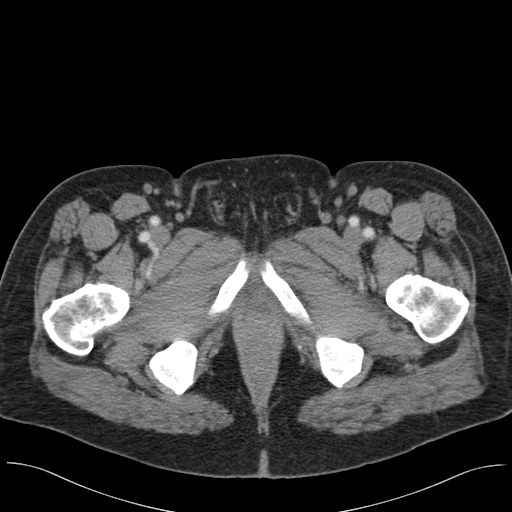
[im 5/90  bone]
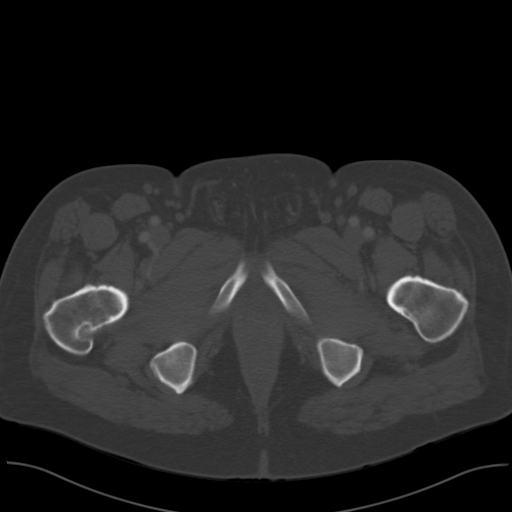
[im 15/90  soft-tissue]
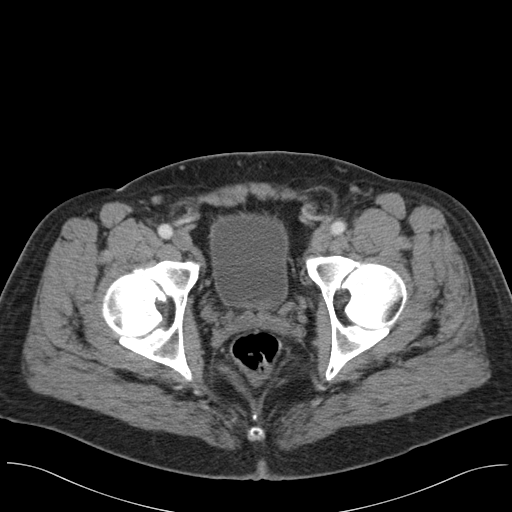
[im 20/90  soft-tissue]
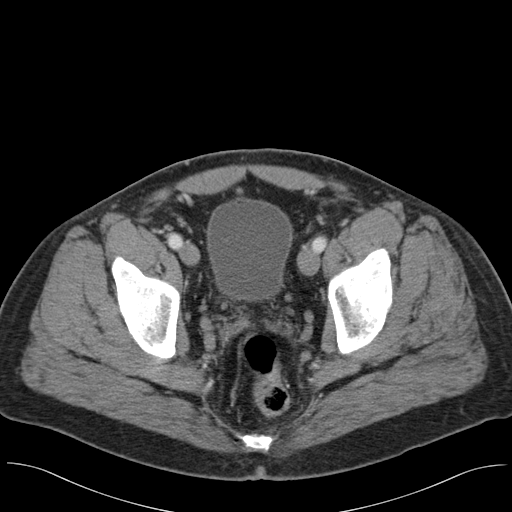
[im 25/90  soft-tissue]
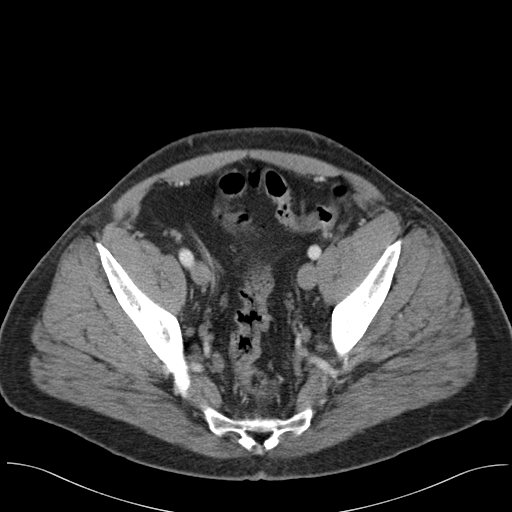
[im 35/90  soft-tissue]
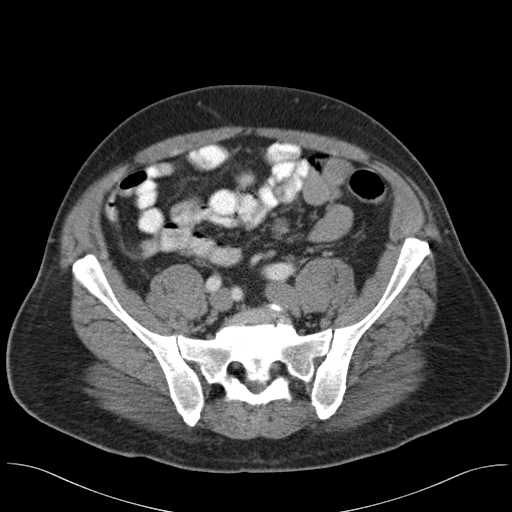
[im 40/90  soft-tissue]
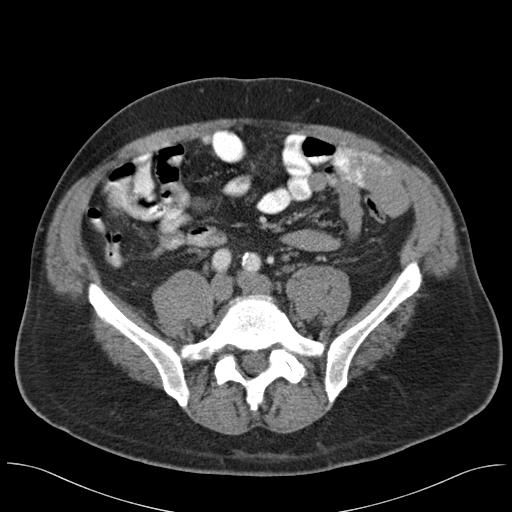
[im 50/90  soft-tissue]
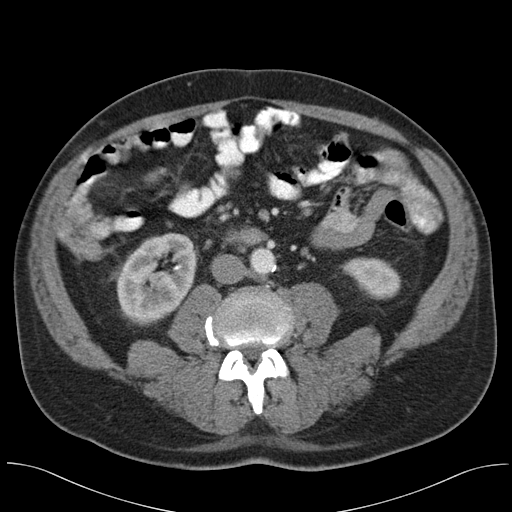
[im 55/90  soft-tissue]
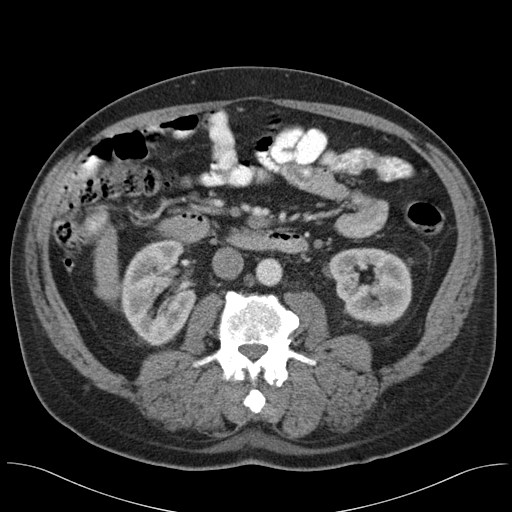
[im 65/90  soft-tissue]
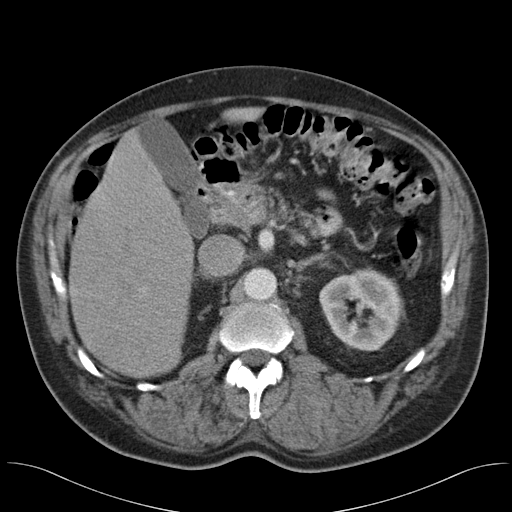
[im 65/90  bone]
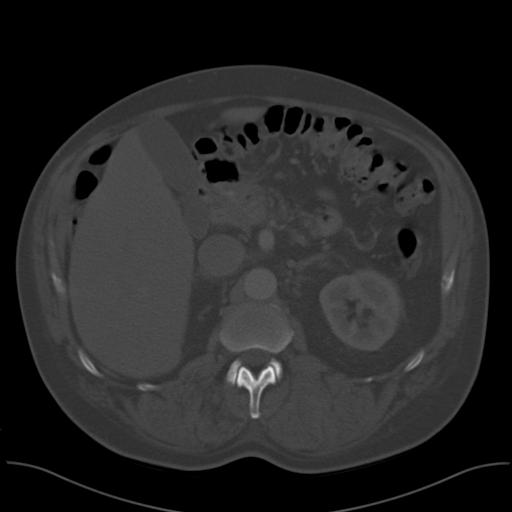
[im 70/90  soft-tissue]
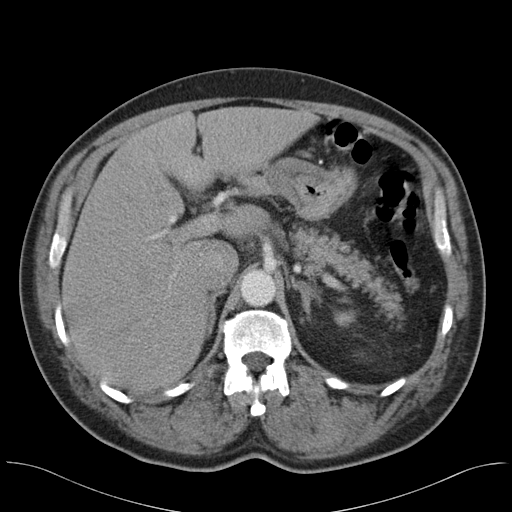
[im 70/90  lung]
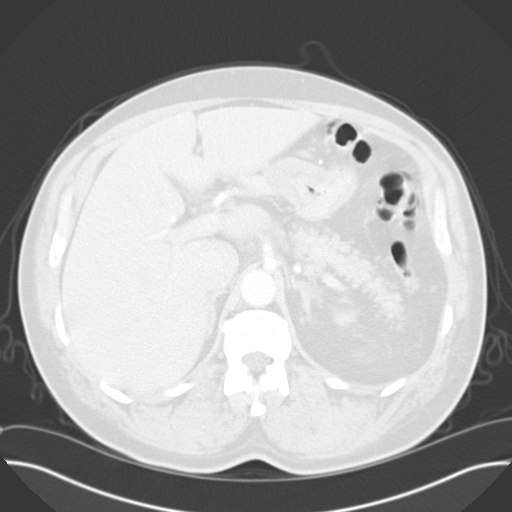
[im 75/90  soft-tissue]
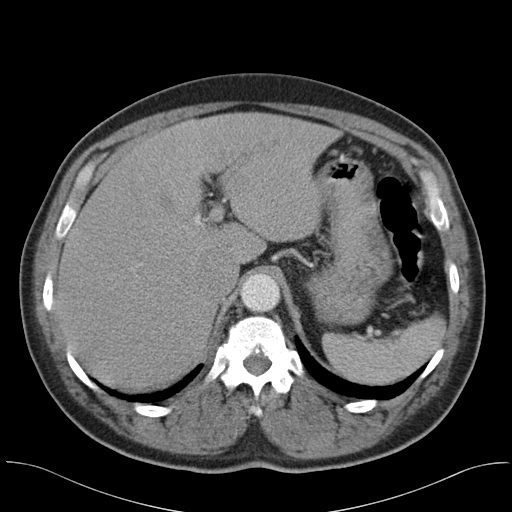
[im 75/90  lung]
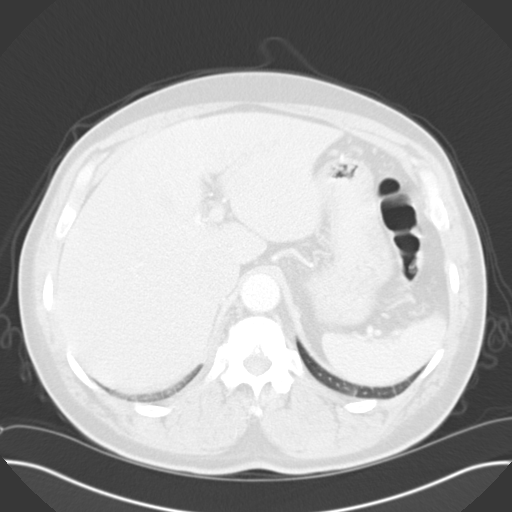
[im 80/90  lung]
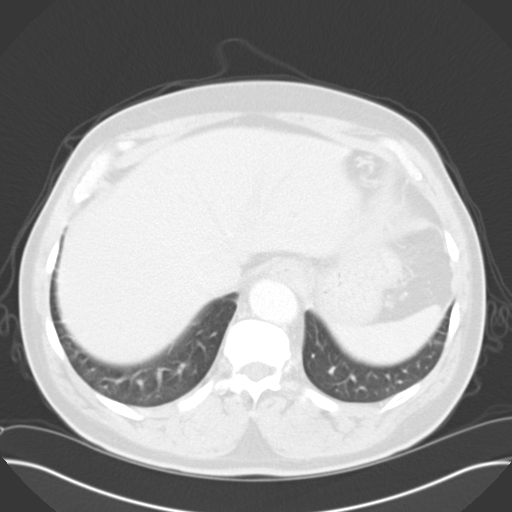
[im 85/90  soft-tissue]
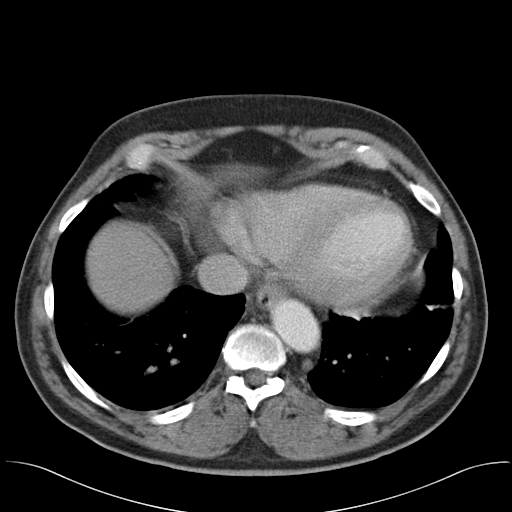
[im 85/90  lung]
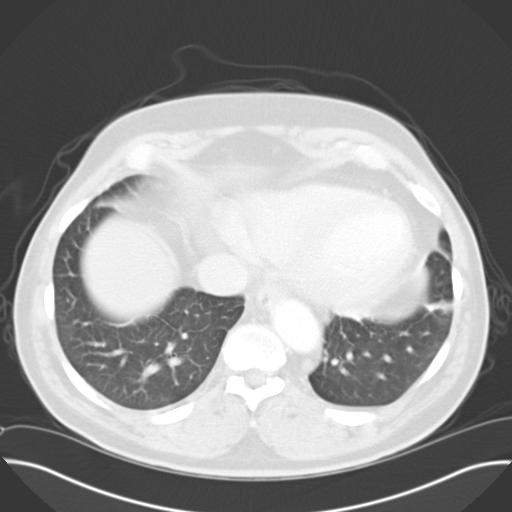

[13 of 32 positions shown; findings below may reference images not displayed]

FINDINGS: Lower chest: Mild atelectasis/scarring at the left lung base. At
least mild cardiomegaly, incompletely imaged.

Hepatobiliary: No masses or other significant abnormality.

Pancreas: No mass, inflammatory changes, or other significant
abnormality.

Spleen: Within normal limits in size and appearance.

Adrenals/Urinary Tract: 1 mm left renal stone. 4 mm stone within the
lower pole the right renal pelvis. No hydronephrosis bilaterally. No
perinephric fluid. No ureteral or bladder calculi. Bladder is
unremarkable, partially decompressed.

Stomach/Bowel: Bowel is normal in caliber. No bowel wall thickening
or evidence of bowel wall inflammation. Retrocecal appendix appears
normal. Stomach appears normal.

Vascular/Lymphatic: No pathologically enlarged lymph nodes. No
evidence of abdominal aortic aneurysm.

Reproductive: No mass or other significant abnormality.

Other: No free fluid or abscess collection. No free intraperitoneal
air.

Musculoskeletal: No acute or suspicious osseous lesion. Mild
degenerative change in the lumbar spine. Central partially calcified
disc bold at L4-5 causing moderate central canal stenosis with
possible associated nerve root impingement.

Small bilateral inguinal hernias which contain fat only. Superficial
soft tissues otherwise unremarkable.
IMPRESSION: 1. Bilateral nephrolithiasis, largest stone on the right measuring 4
mm. No hydronephrosis bilaterally. No ureteral or bladder calculi.
2. No evidence of acute intra-abdominal or intrapelvic abnormality.
No free fluid or inflammatory change. No bowel obstruction. No
evidence of acute solid organ abnormality.
3. Cardiomegaly, incompletely imaged.
4. Degenerative changes of the lumbar spine with most prominent disc
bulge at the L4-5 level causing moderate central canal stenosis with
possible associated nerve root impingement. This is a possible
contributing source for flank pain.
5. Additional chronic/incidental findings detailed above.

## 2017-07-01 ENCOUNTER — Ambulatory Visit: Payer: Commercial Managed Care - PPO | Admitting: Family

## 2017-07-02 ENCOUNTER — Ambulatory Visit: Payer: Commercial Managed Care - PPO | Attending: Family | Admitting: Family

## 2017-07-02 ENCOUNTER — Encounter: Payer: Self-pay | Admitting: Family

## 2017-07-02 VITALS — BP 108/94 | HR 85 | Resp 20 | Ht 74.0 in | Wt 230.0 lb

## 2017-07-02 DIAGNOSIS — R062 Wheezing: Secondary | ICD-10-CM | POA: Diagnosis not present

## 2017-07-02 DIAGNOSIS — Z87891 Personal history of nicotine dependence: Secondary | ICD-10-CM | POA: Diagnosis not present

## 2017-07-02 DIAGNOSIS — J410 Simple chronic bronchitis: Secondary | ICD-10-CM

## 2017-07-02 DIAGNOSIS — I5022 Chronic systolic (congestive) heart failure: Secondary | ICD-10-CM | POA: Insufficient documentation

## 2017-07-02 DIAGNOSIS — J449 Chronic obstructive pulmonary disease, unspecified: Secondary | ICD-10-CM | POA: Diagnosis not present

## 2017-07-02 DIAGNOSIS — Z8249 Family history of ischemic heart disease and other diseases of the circulatory system: Secondary | ICD-10-CM | POA: Diagnosis not present

## 2017-07-02 DIAGNOSIS — I10 Essential (primary) hypertension: Secondary | ICD-10-CM

## 2017-07-02 DIAGNOSIS — Z8673 Personal history of transient ischemic attack (TIA), and cerebral infarction without residual deficits: Secondary | ICD-10-CM | POA: Insufficient documentation

## 2017-07-02 DIAGNOSIS — I110 Hypertensive heart disease with heart failure: Secondary | ICD-10-CM | POA: Insufficient documentation

## 2017-07-02 NOTE — Progress Notes (Signed)
Patient ID: Stephen [name], male    DOB: 12/30/1966, 51 y.o.   MRN: [id number]  HPI  Stephen [name] is a 51 y/o male with a history of TIA, HTN and HF with reduced ejection fraction who returns for a follow-up.   Last echo done 05/21/17 showed an EF of 20-25% which is stable from previous echo which was done 06/22/16 with an EF of 20%, mild MR, no aortic stenosis or regurgitation. Last saw his cardiologist in July 2017.  Admitted 06/18/17 due to HF exacerbation. Cardiology consult obtained. Discharged home after 2 days. Went to Urgent Care 06/18/17 and was directed to the ED.  Admitted 05/20/17 due to HF exacerbation. Initially needed IV diuretics. Cardiology consult obtained. Discharged the next day. Was at Urgent Care on 03/22/17 due to bronchitis. Treated and released home.   Has not been seen since September 2017 but returns today for a follow-up visit with a chief complaint of mild fatigue upon moderate exertion. He says that this has been present for several months with varying levels of severity. Has associated anxiety along with this.  Denies any shortness of breath, chest pain, edema or dizziness.   Past Medical History:  Diagnosis Date  . CHF (congestive heart failure) (HCC)    nonischemic cardiomyopathy, EF 25%  . Hypertension   . TIA (transient ischemic attack) 06/21/16   Past Surgical History:  Procedure Laterality Date  . KNEE SURGERY Right   . VASECTOMY      Family History  Problem Relation Age of Onset  . Hypertension Mother   . Heart failure Mother   . Hypertension Father   . CAD Father     Social History  Substance Use Topics  . Smoking status: Former Smoker    Types: Cigarettes    Quit date: 02/20/2016  . Smokeless tobacco: Never Used  . Alcohol use 0.0 oz/week     Comment: beer occasional    Allergies  Allergen Reactions  . Lisinopril Cough    Prior to Admission medications   Medication Sig Start Date End Date Taking? Authorizing Provider  Acetaminophen  500 MG coapsule Take 1 capsule by mouth every 4 (four) hours as needed for fever.   Yes [provider]  albuterol (PROVENTIL HFA;VENTOLIN HFA) 108 (90 Base) MCG/ACT inhaler Inhale 2 puffs into the lungs every 4 (four) hours as needed for wheezing or shortness of breath. 03/22/17  Yes [name], MD  atorvastatin (LIPITOR) 40 MG tablet Take 1 tablet (40 mg total) by mouth daily at 6 PM. Patient taking differently: Take 40 mg by mouth daily.  06/22/16  Yes [name], MD  azithromycin (ZITHROMAX) 250 MG tablet One tab x 4 days 06/20/17  Yes [name], MD  Benzocaine (ANBESOL) 10 % LIQD Use as directed 1 application in the mouth or throat 4 (four) times daily as needed.   Yes [provider]  Camphor-Eucalyptus-Menthol (CHEST RUB) 4.8-1.2-2.6 % OINT Apply 1 application topically as needed.   Yes [provider]  carvedilol (COREG) 6.25 MG tablet Take 6.25 mg by mouth 2 (two) times daily. 06/20/16  Yes [provider]  clopidogrel (PLAVIX) 75 MG tablet Take 1 tablet (75 mg total) by mouth daily. 06/22/16  Yes [name], MD  fluticasone (FLONASE) 50 MCG/ACT nasal spray Place 2 sprays into both nostrils daily. 06/20/17  Yes [name], MD  Fluticasone-Salmeterol (ADVAIR DISKUS) 250-50 MCG/DOSE AEPB Inhale 1 puff into the lungs 2 (two) times daily. 06/20/17 06/20/18 Yes Patel, [place],  MD  furosemide (LASIX) 40 MG tablet Take 1 tablet (40 mg total) by mouth 2 (two) times daily. 05/21/17  Yes [name], MD  Multiple Vitamin (MULTIVITAMIN WITH MINERALS) TABS tablet Take 1 tablet by mouth daily.   Yes [provider]  pantoprazole (PROTONIX) 40 MG tablet Take 1 tablet (40 mg total) by mouth daily. 06/20/17  Yes [name], MD  potassium chloride 20 MEQ TBCR Take 20 mEq by mouth daily. 05/21/17  Yes [name], MD  sacubitril-valsartan (ENTRESTO) 49-51 MG Take 1 tablet by mouth 2 (two) times daily. Discontinue current  valsartan (diovan) 09/25/16  Yes [name] A, FNP  tiotropium (SPIRIVA HANDIHALER) 18 MCG inhalation capsule Place 1 capsule (18 mcg total) into inhaler and inhale daily. 06/20/17  Yes [name], MD   Review of Systems  Constitutional: Positive for fatigue. Negative for appetite change.  HENT: Negative for congestion, postnasal drip and sore throat.   Eyes: Negative.   Respiratory: Negative for chest tightness and shortness of breath.   Cardiovascular: Negative for chest pain, palpitations and leg swelling.  Gastrointestinal: Negative for abdominal distention and abdominal pain.  Endocrine: Negative.   Genitourinary: Negative.   Musculoskeletal: Negative for back pain and neck pain.  Skin: Negative.   Allergic/Immunologic: Negative.   Neurological: Negative for dizziness and light-headedness.  Hematological: Negative for adenopathy. Does not bruise/bleed easily.  Psychiatric/Behavioral: Positive for sleep disturbance (wake up feeling tired; sleeping on 1 pillow). Negative for dysphoric mood. The patient is nervous/anxious (at times ).    Vitals:   07/02/17 0824  BP: (!) 108/94  Pulse: 85  Resp: 20  SpO2: 100%  Weight: 230 lb (104.3 kg)  Height: 6\' 2"  (1.88 m)   Wt Readings from Last 3 Encounters:  07/02/17 230 lb (104.3 kg)  06/20/17 236 lb 1.6 oz (107.1 kg)  06/18/17 236 lb (107 kg)   Lab Results  Component Value Date   CREATININE 1.22 06/19/2017   CREATININE 1.09 06/18/2017   CREATININE 1.16 05/21/2017    Physical Exam  Constitutional: He is oriented to person, place, and time. He appears well-developed and well-nourished.  HENT:  Head: Normocephalic and atraumatic.  Neck: Normal range of motion. Neck supple.  Cardiovascular: Normal rate and regular rhythm.   Pulmonary/Chest: Effort normal. He has no wheezes. He has no rales.  Abdominal: Soft. He exhibits no distension. There is no tenderness.  Musculoskeletal: He exhibits no edema or tenderness.   Neurological: He is alert and oriented to person, place, and time.  Skin: Skin is warm and dry.  Psychiatric: He has a normal mood and affect. His behavior is normal. Thought content normal.  Nursing note and vitals reviewed.    Assessment & Plan:  1: Chronic heart failure with reduced ejection fraction-  - NYHA Class II - euvolemic - Continue daily weights & call for overnight weight gain of >2 pounds or a weekly weight gain of >5 pounds; has lost 13 pounds since he was last here September 2017  - not adding salt but does BBQ on a large cooker and does taste what he's cooking and does use salt when he's cooking. Discussed the importance of closely following a 2000mg  sodium diet - discussed titrating up entresto or carvedilol at his next office visit - saw cardiologist Clayborn Bigness) 04/24/17 and returns to him October 2018  2: HTN-  - BP looks ok today - sees his PCP later this month  3: COPD- - using inhalers - sees pulmonologist ([place]) 07/25/17  Return here in 1 month or sooner for any questions/problem

## 2017-07-02 NOTE — Patient Instructions (Signed)
Continue weighing daily and call for an overnight weight gain of > 2 pounds or a weekly weight gain of >5 pounds. 

## 2017-07-04 DIAGNOSIS — J449 Chronic obstructive pulmonary disease, unspecified: Secondary | ICD-10-CM | POA: Insufficient documentation

## 2017-07-25 ENCOUNTER — Ambulatory Visit (INDEPENDENT_AMBULATORY_CARE_PROVIDER_SITE_OTHER): Payer: Commercial Managed Care - PPO | Admitting: Internal Medicine

## 2017-07-25 ENCOUNTER — Encounter: Payer: Self-pay | Admitting: Internal Medicine

## 2017-07-25 VITALS — BP 128/68 | HR 63 | Resp 16 | Ht 72.0 in | Wt 237.0 lb

## 2017-07-25 DIAGNOSIS — J449 Chronic obstructive pulmonary disease, unspecified: Secondary | ICD-10-CM | POA: Diagnosis not present

## 2017-07-25 MED ORDER — TIOTROPIUM BROMIDE MONOHYDRATE 2.5 MCG/ACT IN AERS: 2.5000 | INHALATION_SPRAY | Freq: Two times a day (BID) | RESPIRATORY_TRACT | 12 refills | Status: DC

## 2017-07-25 NOTE — Patient Instructions (Signed)
Continue Advair as prescribed  albuterol as needed  will stop Spiriva hand- inhaler  start Spiriva Respimat  avoid smoke exposure

## 2017-07-25 NOTE — Progress Notes (Signed)
Name: Stephen [name] MRN: [id number] DOB: 11-09-1966     CONSULTATION DATE: 07/24/17 REFERRING MD :   Posey Pronto  CHIEF COMPLAINT:  copd  STUDIES:   chest x-ray 06/18/2017 reviewed on 07/24/17 I have Independently reviewed images Interpretation: bilateral interstitial infiltrates   HISTORY OF PRESENT ILLNESS:  51 y.o.malewho presents  To hospital and   Admitted for CHF exacerbation - he was found to have likely CHF exacerbation. He has a history of chronic systolic CHF with latest EF 20-25%.   - Patient presented and was seen in the emergency room and was noted to have CHF he was treated with Lasix. With improvement in his symptoms. He continued complaint of cough. Cough could be related to GERD he also has seasonal allergies    patient noted to have been diagnosed with Copd and was asked to follow-up with pulmonary as outpatient  patient states he takes Advair as needed,  He does not know how to use his Advair He takes albuterol as needed He takes Spiriva but does not know how to use it   patient has chronic cough and intermittent wheezing Patient states that the pro-air helps with his wheezing   patient is a former smoker one pack per week for the last 50 years  patient is exposed to smoke from his cooker   patient has no signs of acute COPD exacerbation at this time Patient does not have any signs of infection at this time Patient does not have any signs of acute CHF at this time   PAST MEDICAL HISTORY :   has a past medical history of CHF (congestive heart failure) ([place]); Hypertension; and TIA (transient ischemic attack) (06/21/16).  has a past surgical history that includes Knee surgery (Right) and Vasectomy. Prior to Admission medications   Medication Sig Start Date End Date Taking? Authorizing Provider  Acetaminophen 500 MG coapsule Take 1 capsule by mouth every 4 (four) hours as needed for fever.   Yes [provider]  albuterol (PROVENTIL HFA;VENTOLIN HFA)  108 (90 Base) MCG/ACT inhaler Inhale 2 puffs into the lungs every 4 (four) hours as needed for wheezing or shortness of breath. 03/22/17  Yes [name], MD  atorvastatin (LIPITOR) 40 MG tablet Take 1 tablet (40 mg total) by mouth daily at 6 PM. Patient taking differently: Take 40 mg by mouth daily.  06/22/16  Yes [name], MD  azithromycin (ZITHROMAX) 250 MG tablet One tab x 4 days 06/20/17  Yes [name], MD  Benzocaine (ANBESOL) 10 % LIQD Use as directed 1 application in the mouth or throat 4 (four) times daily as needed.   Yes [provider]  Camphor-Eucalyptus-Menthol (CHEST RUB) 4.8-1.2-2.6 % OINT Apply 1 application topically as needed.   Yes [provider]  carvedilol (COREG) 6.25 MG tablet Take 6.25 mg by mouth 2 (two) times daily. 06/20/16  Yes [provider]  clopidogrel (PLAVIX) 75 MG tablet Take 1 tablet (75 mg total) by mouth daily. 06/22/16  Yes [name], MD  fluticasone (FLONASE) 50 MCG/ACT nasal spray Place 2 sprays into both nostrils daily. 06/20/17  Yes [name], MD  Fluticasone-Salmeterol (ADVAIR DISKUS) 250-50 MCG/DOSE AEPB Inhale 1 puff into the lungs 2 (two) times daily. 06/20/17 06/20/18 Yes [name], MD  furosemide (LASIX) 40 MG tablet Take 1 tablet (40 mg total) by mouth 2 (two) times daily. 05/21/17  Yes [name], MD  Multiple Vitamin (MULTIVITAMIN WITH MINERALS) TABS tablet Take 1 tablet by mouth daily.  Yes [provider]  pantoprazole (PROTONIX) 40 MG tablet Take 1 tablet (40 mg total) by mouth daily. 06/20/17  Yes [name], MD  potassium chloride 20 MEQ TBCR Take 20 mEq by mouth daily. 05/21/17  Yes [name], MD  sacubitril-valsartan (ENTRESTO) 49-51 MG Take 1 tablet by mouth 2 (two) times daily. Discontinue current valsartan (diovan) 09/25/16  Yes [name] A, FNP  tiotropium (SPIRIVA HANDIHALER) 18 MCG inhalation capsule Place 1 capsule (18 mcg total) into inhaler and  inhale daily. 06/20/17  Yes [name], MD  KLOR-CON M20 20 MEQ tablet Take 20 mEq by mouth daily. 07/16/17   [provider]   Allergies  Allergen Reactions  . Lisinopril Cough    FAMILY HISTORY:  family history includes CAD in his father; Heart failure in his mother; Hypertension in his father and mother. SOCIAL HISTORY:  reports that he quit smoking about 17 months ago. His smoking use included Cigarettes. He has never used smokeless tobacco. He reports that he drinks alcohol. He reports that he does not use drugs.  REVIEW OF SYSTEMS:   Constitutional: Negative for fever, chills, weight loss, malaise/fatigue and diaphoresis.  HENT: Negative for hearing loss, ear pain, nosebleeds, congestion, sore throat, neck pain, tinnitus and ear discharge.   Eyes: Negative for blurred vision, double vision, photophobia, pain, discharge and redness.  Respiratory: +cough, hemoptysis, sputum production,  +shortness of breath, +wheezing and stridor.   Cardiovascular: Negative for chest pain, palpitations, orthopnea, claudication, leg swelling and PND.  Gastrointestinal: Negative for heartburn, nausea, vomiting, abdominal pain, diarrhea, constipation, blood in stool and melena.  Genitourinary: Negative for dysuria, urgency, frequency, hematuria and flank pain.  Musculoskeletal: Negative for myalgias, back pain, joint pain and falls.  Skin: Negative for itching and rash.  Neurological: Negative for dizziness, tingling, tremors, sensory change, speech change, focal weakness, seizures, loss of consciousness, weakness and headaches.  Endo/Heme/Allergies: Negative for environmental allergies and polydipsia. Does not bruise/bleed easily.  ALL OTHER ROS ARE NEGATIVE   BP 128/68 (BP Location: Left Arm, Cuff Size: Normal)   Pulse 63   Resp 16   Ht 6' (1.829 m)   Wt 237 lb (107.5 kg)   SpO2 100%   BMI 32.14 kg/m   Physical Examination:   GENERAL:NAD, no fevers, chills, no weakness no  fatigue HEAD: Normocephalic, atraumatic.  EYES: Pupils equal, round, reactive to light. Extraocular muscles intact. No scleral icterus.  MOUTH: Moist mucosal membrane.   EAR, NOSE, THROAT: Clear without exudates. No external lesions.  NECK: Supple. No thyromegaly. No nodules. No JVD.  PULMONARY:CTA B/L no wheezes, no crackles, no rhonchi CARDIOVASCULAR: S1 and S2. Regular rate and rhythm. No murmurs, rubs, or gallops. No edema.  GASTROINTESTINAL: Soft, nontender, nondistended. No masses. Positive bowel sounds.  MUSCULOSKELETAL: No swelling, clubbing, or edema. Range of motion full in all extremities.  NEUROLOGIC: Cranial nerves II through XII are intact. No gross focal neurological deficits.  SKIN: No ulceration, lesions, rashes, or cyanosis. Skin warm and dry. Turgor intact.  PSYCHIATRIC: Mood, affect within normal limits. The patient is awake, alert and oriented x 3. Insight, judgment intact.      ASSESSMENT / PLAN: 51 year old pleasant African-American male seen today for assessment for COPD in the setting of   congestive heart failure and deconditioned state   #1COPD Patient will need full set of PFTs Patient advised to continue Advair education provided in the Albuterol as Needed  will change to Spiriva Respimat  As he does not like to use  the HandiHaler  I have also advised patient to use mask any time he uses his cooker  his COPD is stable at this time   #2 CHF with the  ejection fraction of 20%  follow-up with Dr. Jerrye Beavers  his CHF seems to be stable at this time   #3Deconditioned state -Recommend increased daily activity and exercise  Patient satisfied with Plan of action and management. All questions answered Follow-up in 3 months   Carey Johndrow Patricia Pesa, M.D.  [facility]  Medical Director [place] Director [facility] Cardio-Pulmonary Department

## 2017-07-25 NOTE — Addendum Note (Signed)
Addended by: Devona Konig on: 07/25/2017 11:10 AM   Modules accepted: Orders

## 2017-07-29 ENCOUNTER — Ambulatory Visit: Payer: Commercial Managed Care - PPO | Admitting: Family

## 2017-07-29 ENCOUNTER — Observation Stay
Admission: EM | Admit: 2017-07-29 | Discharge: 2017-07-31 | Disposition: A | Discharge status: Home / Self Care | Payer: Commercial Managed Care - PPO | Attending: Internal Medicine | Admitting: Internal Medicine

## 2017-07-29 ENCOUNTER — Emergency Department: Payer: Commercial Managed Care - PPO

## 2017-07-29 ENCOUNTER — Observation Stay: Payer: Commercial Managed Care - PPO

## 2017-07-29 DIAGNOSIS — Z87891 Personal history of nicotine dependence: Secondary | ICD-10-CM | POA: Insufficient documentation

## 2017-07-29 DIAGNOSIS — I110 Hypertensive heart disease with heart failure: Secondary | ICD-10-CM | POA: Insufficient documentation

## 2017-07-29 DIAGNOSIS — M25511 Pain in right shoulder: Secondary | ICD-10-CM | POA: Insufficient documentation

## 2017-07-29 DIAGNOSIS — Z7902 Long term (current) use of antithrombotics/antiplatelets: Secondary | ICD-10-CM | POA: Insufficient documentation

## 2017-07-29 DIAGNOSIS — E785 Hyperlipidemia, unspecified: Secondary | ICD-10-CM | POA: Insufficient documentation

## 2017-07-29 DIAGNOSIS — I4729 Other ventricular tachycardia: Secondary | ICD-10-CM

## 2017-07-29 DIAGNOSIS — M549 Dorsalgia, unspecified: Secondary | ICD-10-CM | POA: Insufficient documentation

## 2017-07-29 DIAGNOSIS — Z888 Allergy status to other drugs, medicaments and biological substances status: Secondary | ICD-10-CM | POA: Insufficient documentation

## 2017-07-29 DIAGNOSIS — Z8249 Family history of ischemic heart disease and other diseases of the circulatory system: Secondary | ICD-10-CM | POA: Insufficient documentation

## 2017-07-29 DIAGNOSIS — K219 Gastro-esophageal reflux disease without esophagitis: Secondary | ICD-10-CM | POA: Insufficient documentation

## 2017-07-29 DIAGNOSIS — I472 Ventricular tachycardia, unspecified: Secondary | ICD-10-CM

## 2017-07-29 DIAGNOSIS — N179 Acute kidney failure, unspecified: Secondary | ICD-10-CM | POA: Diagnosis not present

## 2017-07-29 DIAGNOSIS — I5023 Acute on chronic systolic (congestive) heart failure: Secondary | ICD-10-CM | POA: Diagnosis not present

## 2017-07-29 DIAGNOSIS — J449 Chronic obstructive pulmonary disease, unspecified: Secondary | ICD-10-CM | POA: Diagnosis not present

## 2017-07-29 DIAGNOSIS — Z8673 Personal history of transient ischemic attack (TIA), and cerebral infarction without residual deficits: Secondary | ICD-10-CM | POA: Diagnosis not present

## 2017-07-29 DIAGNOSIS — Z79899 Other long term (current) drug therapy: Secondary | ICD-10-CM | POA: Insufficient documentation

## 2017-07-29 DIAGNOSIS — R52 Pain, unspecified: Secondary | ICD-10-CM

## 2017-07-29 DIAGNOSIS — I429 Cardiomyopathy, unspecified: Secondary | ICD-10-CM | POA: Diagnosis not present

## 2017-07-29 DIAGNOSIS — R079 Chest pain, unspecified: Secondary | ICD-10-CM

## 2017-07-29 LAB — CBC WITH DIFFERENTIAL/PLATELET
BASOS ABS: 0.1 10*3/uL (ref 0–0.1)
Basophils Relative: 2 %
EOS PCT: 3 %
Eosinophils Absolute: 0.2 10*3/uL (ref 0–0.7)
HEMATOCRIT: 46.4 % (ref 40.0–52.0)
Hemoglobin: 15.2 g/dL (ref 13.0–18.0)
LYMPHS PCT: 29 %
Lymphs Abs: 2.3 10*3/uL (ref 1.0–3.6)
MCH: 28.9 pg (ref 26.0–34.0)
MCHC: 32.8 g/dL (ref 32.0–36.0)
MCV: 88.1 fL (ref 80.0–100.0)
MONO ABS: 0.6 10*3/uL (ref 0.2–1.0)
MONOS PCT: 8 %
Neutro Abs: 4.6 10*3/uL (ref 1.4–6.5)
Neutrophils Relative %: 58 %
PLATELETS: 147 10*3/uL — AB (ref 150–440)
RBC: 5.26 MIL/uL (ref 4.40–5.90)
RDW: 14.9 % — AB (ref 11.5–14.5)
WBC: 7.9 10*3/uL (ref 3.8–10.6)

## 2017-07-29 LAB — BASIC METABOLIC PANEL
ANION GAP: 7 (ref 5–15)
BUN: 15 mg/dL (ref 6–20)
CALCIUM: 9 mg/dL (ref 8.9–10.3)
CO2: 23 mmol/L (ref 22–32)
Chloride: 109 mmol/L (ref 101–111)
Creatinine, Ser: 1.24 mg/dL (ref 0.61–1.24)
GFR calc Af Amer: >60 mL/min (ref >60)
GLUCOSE: 101 mg/dL — AB (ref 65–99)
Potassium: 3.7 mmol/L (ref 3.5–5.1)
Sodium: 139 mmol/L (ref 135–145)

## 2017-07-29 LAB — TROPONIN I
TROPONIN I: 0.03 ng/mL — AB (ref <0.03)
Troponin I: 0.03 ng/mL (ref <0.03)
Troponin I: 0.03 ng/mL (ref <0.03)
Troponin I: 0.03 ng/mL (ref <0.03)

## 2017-07-29 LAB — BRAIN NATRIURETIC PEPTIDE: B Natriuretic Peptide: 1727 pg/mL — ABNORMAL HIGH (ref 0.0–100.0)

## 2017-07-29 MED ORDER — MORPHINE SULFATE (PF) 2 MG/ML IV SOLN
2.0000 mg | Freq: Four times a day (QID) | INTRAVENOUS | Status: DC | PRN
  Administered 2017-07-29 – 2017-07-30 (×3): 2 mg via INTRAVENOUS
  Filled 2017-07-29 (×3): qty 1

## 2017-07-29 MED ORDER — ALBUTEROL SULFATE (2.5 MG/3ML) 0.083% IN NEBU
3.0000 mL | INHALATION_SOLUTION | RESPIRATORY_TRACT | Status: DC | PRN
  Administered 2017-07-30: 3 mL via RESPIRATORY_TRACT
  Filled 2017-07-29: qty 3

## 2017-07-29 MED ORDER — MOMETASONE FURO-FORMOTEROL FUM 200-5 MCG/ACT IN AERO
2.0000 | INHALATION_SPRAY | Freq: Two times a day (BID) | RESPIRATORY_TRACT | Status: DC
  Administered 2017-07-29 – 2017-07-31 (×4): 2 via RESPIRATORY_TRACT
  Filled 2017-07-29: qty 8.8

## 2017-07-29 MED ORDER — ONDANSETRON HCL 4 MG/2ML IJ SOLN: 4.0000 mg | Freq: Four times a day (QID) | INTRAMUSCULAR | Status: DC | PRN

## 2017-07-29 MED ORDER — KETOROLAC TROMETHAMINE 30 MG/ML IJ SOLN
30.0000 mg | Freq: Once | INTRAMUSCULAR | Status: AC
  Administered 2017-07-29: 30 mg via INTRAVENOUS
  Filled 2017-07-29: qty 1

## 2017-07-29 MED ORDER — ENOXAPARIN SODIUM 40 MG/0.4ML ~~LOC~~ SOLN
40.0000 mg | SUBCUTANEOUS | Status: DC
  Administered 2017-07-29 – 2017-07-31 (×3): 40 mg via SUBCUTANEOUS
  Filled 2017-07-29 (×3): qty 0.4

## 2017-07-29 MED ORDER — FENTANYL CITRATE (PF) 100 MCG/2ML IJ SOLN
50.0000 ug | INTRAMUSCULAR | Status: DC | PRN
  Administered 2017-07-30: 50 ug via INTRAVENOUS
  Filled 2017-07-29: qty 2

## 2017-07-29 MED ORDER — HYDROCODONE-ACETAMINOPHEN 5-325 MG PO TABS
1.0000 | ORAL_TABLET | ORAL | Status: DC | PRN
  Administered 2017-07-29 – 2017-07-31 (×5): 1 via ORAL
  Filled 2017-07-29 (×5): qty 1

## 2017-07-29 MED ORDER — FLUTICASONE PROPIONATE 50 MCG/ACT NA SUSP
2.0000 | Freq: Every day | NASAL | Status: DC
  Administered 2017-07-31: 2 via NASAL
  Filled 2017-07-29: qty 16

## 2017-07-29 MED ORDER — CYCLOBENZAPRINE HCL 10 MG PO TABS
5.0000 mg | ORAL_TABLET | Freq: Three times a day (TID) | ORAL | Status: DC | PRN
  Administered 2017-07-29 – 2017-07-30 (×3): 5 mg via ORAL
  Filled 2017-07-29 (×3): qty 1

## 2017-07-29 MED ORDER — FUROSEMIDE 40 MG PO TABS
40.0000 mg | ORAL_TABLET | Freq: Two times a day (BID) | ORAL | Status: DC
  Administered 2017-07-29: 40 mg via ORAL
  Filled 2017-07-29: qty 1

## 2017-07-29 MED ORDER — AMIODARONE HCL IN DEXTROSE 360-4.14 MG/200ML-% IV SOLN
30.0000 mg/h | INTRAVENOUS | Status: DC
  Administered 2017-07-30: 30 mg/h via INTRAVENOUS
  Filled 2017-07-29: qty 200

## 2017-07-29 MED ORDER — PANTOPRAZOLE SODIUM 40 MG PO TBEC
40.0000 mg | DELAYED_RELEASE_TABLET | Freq: Every day | ORAL | Status: DC
  Administered 2017-07-29 – 2017-07-31 (×3): 40 mg via ORAL
  Filled 2017-07-29 (×3): qty 1

## 2017-07-29 MED ORDER — ACETAMINOPHEN 650 MG RE SUPP: 650.0000 mg | Freq: Four times a day (QID) | RECTAL | Status: DC | PRN

## 2017-07-29 MED ORDER — CLOPIDOGREL BISULFATE 75 MG PO TABS
75.0000 mg | ORAL_TABLET | Freq: Every day | ORAL | Status: DC
  Administered 2017-07-30 – 2017-07-31 (×2): 75 mg via ORAL
  Filled 2017-07-29 (×2): qty 1

## 2017-07-29 MED ORDER — ACETAMINOPHEN 325 MG PO TABS: 650.0000 mg | ORAL_TABLET | Freq: Four times a day (QID) | ORAL | Status: DC | PRN

## 2017-07-29 MED ORDER — POTASSIUM CHLORIDE CRYS ER 20 MEQ PO TBCR
20.0000 meq | EXTENDED_RELEASE_TABLET | Freq: Every day | ORAL | Status: DC
  Administered 2017-07-30 – 2017-07-31 (×2): 20 meq via ORAL
  Filled 2017-07-29 (×3): qty 1

## 2017-07-29 MED ORDER — NITROGLYCERIN 0.4 MG SL SUBL
0.4000 mg | SUBLINGUAL_TABLET | SUBLINGUAL | Status: DC | PRN
  Filled 2017-07-29: qty 1

## 2017-07-29 MED ORDER — ONDANSETRON HCL 4 MG PO TABS: 4.0000 mg | ORAL_TABLET | Freq: Four times a day (QID) | ORAL | Status: DC | PRN

## 2017-07-29 MED ORDER — MAGNESIUM SULFATE 2 GM/50ML IV SOLN
2.0000 g | Freq: Once | INTRAVENOUS | Status: AC
  Administered 2017-07-29: 2 g via INTRAVENOUS
  Filled 2017-07-29: qty 50

## 2017-07-29 MED ORDER — AMIODARONE LOAD VIA INFUSION
150.0000 mg | Freq: Once | INTRAVENOUS | Status: AC
  Administered 2017-07-29: 150 mg via INTRAVENOUS
  Filled 2017-07-29 (×2): qty 83.34

## 2017-07-29 MED ORDER — CARVEDILOL 6.25 MG PO TABS
6.2500 mg | ORAL_TABLET | Freq: Two times a day (BID) | ORAL | Status: DC
  Administered 2017-07-29 – 2017-07-31 (×4): 6.25 mg via ORAL
  Filled 2017-07-29 (×4): qty 1

## 2017-07-29 MED ORDER — AMIODARONE HCL IN DEXTROSE 360-4.14 MG/200ML-% IV SOLN
60.0000 mg/h | INTRAVENOUS | Status: AC
  Administered 2017-07-29 (×2): 60 mg/h via INTRAVENOUS
  Filled 2017-07-29 (×2): qty 200

## 2017-07-29 MED ORDER — TIOTROPIUM BROMIDE MONOHYDRATE 18 MCG IN CAPS
18.0000 ug | ORAL_CAPSULE | Freq: Every day | RESPIRATORY_TRACT | Status: DC
  Administered 2017-07-29 – 2017-07-31 (×3): 18 ug via RESPIRATORY_TRACT
  Filled 2017-07-29: qty 5

## 2017-07-29 MED ORDER — ADULT MULTIVITAMIN W/MINERALS CH
1.0000 | ORAL_TABLET | Freq: Every day | ORAL | Status: DC
  Administered 2017-07-29 – 2017-07-31 (×3): 1 via ORAL
  Filled 2017-07-29 (×3): qty 1

## 2017-07-29 MED ORDER — ATORVASTATIN CALCIUM 20 MG PO TABS
40.0000 mg | ORAL_TABLET | Freq: Every day | ORAL | Status: DC
  Administered 2017-07-29 – 2017-07-31 (×3): 40 mg via ORAL
  Filled 2017-07-29 (×3): qty 2

## 2017-07-29 MED ORDER — ASPIRIN 81 MG PO CHEW
324.0000 mg | CHEWABLE_TABLET | Freq: Once | ORAL | Status: AC
  Administered 2017-07-29: 324 mg via ORAL
  Filled 2017-07-29: qty 4

## 2017-07-29 NOTE — Consult Note (Signed)
[facility] Cardiology  CARDIOLOGY CONSULT NOTE  Patient ID: Stephen [name] MRN: [id number] DOB/AGE: March 17, 1966 51 y.o.  Admit date: 07/29/2017 Referring Physician Verdell Carmine Primary Physician Virk Primary Cardiologist [facility] Reason for Consultation Ventricular tachycardia  HPI: 51 year old male referred for ventricular tachycardia. The patient has a history of nonischemic cardiomyopathy, chronic systolic CHF with EF 81-44% per echocardiogram 04/2017, COPD, hypertension, and history of TIA. The patient reports the development of severe right flank pain yesterday with associated shortness of breath. He came to [facility] ER for further evaluation. In the ER, the patient was noted to have multiple episodes of nonsustained wide complex tachycardia. The patient denies experiencing palpitations, lightheadedness, significant or persistent chest pain, or syncope. Admission labs notable for troponin 0.03, followed by 0.03, potassium 3.7, magnesium 1.8, and creatinine 1.24. Chest xray revealed interstitial pulmonary edema. The patient had a brief episode of chest pain described as throbbing that was brief in nature that quickly resolved without intervention.   Review of systems complete and found to be negative unless listed above     Past Medical History:  Diagnosis Date  . CHF (congestive heart failure) (HCC)    nonischemic cardiomyopathy, EF 25%  . Hypertension   . TIA (transient ischemic attack) 06/21/16    Past Surgical History:  Procedure Laterality Date  . KNEE SURGERY Right   . VASECTOMY      Prescriptions Prior to Admission  Medication Sig Dispense Refill Last Dose  . acetaminophen (TYLENOL) 500 MG tablet Take 1 capsule by mouth every 4 (four) hours as needed for fever.   PRN at PRN  . albuterol (PROVENTIL HFA;VENTOLIN HFA) 108 (90 Base) MCG/ACT inhaler Inhale 2 puffs into the lungs every 4 (four) hours as needed for wheezing or shortness of breath. 1 Inhaler 0 PRN at PRN  . atorvastatin  (LIPITOR) 40 MG tablet Take 1 tablet (40 mg total) by mouth daily at 6 PM. (Patient taking differently: Take 40 mg by mouth daily. ) 30 tablet 2 07/28/2017 at Unknown time  . Benzocaine (ANBESOL) 10 % LIQD Use as directed 1 application in the mouth or throat 4 (four) times daily as needed.   PRN at PRN  . Camphor-Eucalyptus-Menthol (CHEST RUB) 4.8-1.2-2.6 % OINT Apply 1 application topically as needed.   PRN at PRN  . carvedilol (COREG) 6.25 MG tablet Take 6.25 mg by mouth 2 (two) times daily.   07/29/2017 at 2000  . clopidogrel (PLAVIX) 75 MG tablet Take 1 tablet (75 mg total) by mouth daily. 30 tablet 2 07/29/2017 at Unknown time  . fluticasone (FLONASE) 50 MCG/ACT nasal spray Place 2 sprays into both nostrils daily. 16 g 2 PRN at PRN  . Fluticasone-Salmeterol (ADVAIR DISKUS) 250-50 MCG/DOSE AEPB Inhale 1 puff into the lungs 2 (two) times daily. 1 each 0 Past Month at Unknown time  . furosemide (LASIX) 40 MG tablet Take 1 tablet (40 mg total) by mouth 2 (two) times daily. 60 tablet 2 07/28/2017 at 2000  . Multiple Vitamin (MULTIVITAMIN WITH MINERALS) TABS tablet Take 1 tablet by mouth daily.   07/28/2017 at 0800  . pantoprazole (PROTONIX) 40 MG tablet Take 1 tablet (40 mg total) by mouth daily. 30 tablet 0 07/28/2017 at Unknown time  . potassium chloride 20 MEQ TBCR Take 20 mEq by mouth daily. 30 tablet 2 07/29/2017 at Unknown time  . tiotropium (SPIRIVA HANDIHALER) 18 MCG inhalation capsule Place 1 capsule (18 mcg total) into inhaler and inhale daily. 30 capsule 12 Past Month at Unknown time  .  Tiotropium Bromide Monohydrate (SPIRIVA RESPIMAT) 2.5 MCG/ACT AERS Inhale 2.5 Act into the lungs 2 (two) times daily. 1 Inhaler 12 07/28/2017 at Unknown time  . sacubitril-valsartan (ENTRESTO) 49-51 MG Take 1 tablet by mouth 2 (two) times daily. Discontinue current valsartan (diovan) (Patient not taking: Reported on 07/29/2017) 60 tablet 5 Not Taking at Unknown time   Social History   Social History  . Marital  status: Single    Spouse name: N/A  . Number of children: N/A  . Years of education: N/A   Occupational History  . Not on file.   Social History Main Topics  . Smoking status: Former Smoker    Packs/day: 0.25    Years: 20.00    Types: Cigarettes    Quit date: 02/20/2016  . Smokeless tobacco: Never Used  . Alcohol use 0.0 oz/week     Comment: beer occasional  . Drug use: No  . Sexual activity: Not on file   Other Topics Concern  . Not on file   Social History Narrative   Independent at baseline    Family History  Problem Relation Age of Onset  . Hypertension Mother   . Heart failure Mother   . Hypertension Father   . CAD Father   . Heart attack Father       Review of systems complete and found to be negative unless listed above      PHYSICAL EXAM  General: Well developed, well nourished, in no acute distress HEENT:  Normocephalic and atramatic Neck:  No JVD.  Lungs: Normal effort of breathing. No wheezing or crackles Heart: HRRR . Normal S1 and S2 without gallops or murmurs.  Abdomen: Bowel sounds are positive, abdomen soft and non-tender  Msk:  Back normal, normal gait. Normal strength and tone for age.  Extremities: 1+ bilateral lower extremity edema Neuro: Alert and oriented X 3. Psych:  Good affect, responds appropriately  Labs:   Lab Results  Component Value Date   WBC 7.9 07/29/2017   HGB 15.2 07/29/2017   HCT 46.4 07/29/2017   MCV 88.1 07/29/2017   PLT 147 (L) 07/29/2017    Recent Labs Lab 07/29/17 0809  NA 139  K 3.7  CL 109  CO2 23  BUN 15  CREATININE 1.24  CALCIUM 9.0  GLUCOSE 101*   Lab Results  Component Value Date   TROPONINI 0.03 (HH) 07/29/2017    Lab Results  Component Value Date   CHOL 173 06/22/2016   Lab Results  Component Value Date   HDL 36 (L) 06/22/2016   Lab Results  Component Value Date   LDLCALC 124 (H) 06/22/2016   Lab Results  Component Value Date   TRIG 65 06/22/2016   Lab Results  Component  Value Date   CHOLHDL 4.8 06/22/2016   No results found for: LDLDIRECT    Radiology: Dg Chest 2 View  Result Date: 07/29/2017 CLINICAL DATA:  Shortness of breath.  Hypertension. EXAM: CHEST  2 VIEW COMPARISON:  June 18, 2017 FINDINGS: There is interstitial pulmonary edema. No airspace consolidation. There is cardiomegaly with pulmonary venous hypertension. No adenopathy. No bone lesions. IMPRESSION: Findings indicative of a degree of congestive heart failure. No airspace consolidation. Electronically Signed   By: Lowella Grip III M.D.   On: 07/29/2017 07:59   Dg Abdomen 1 View  Result Date: 07/29/2017 CLINICAL DATA:  Right flank pain EXAM: ABDOMEN - 1 VIEW COMPARISON:  CT abdomen and pelvis April 01, 2016 FINDINGS: There is mild stool in  the colon. There is no bowel dilatation or air-fluid level to suggest bowel obstruction. No free air. No abnormal calcifications. IMPRESSION: No demonstrable bowel obstruction or free air. Electronically Signed   By: Lowella Grip III M.D.   On: 07/29/2017 08:00    EKG: Sinus rhythm with frequent wide-QRS complexes  ASSESSMENT AND PLAN:  1. Non-sustained wide QRS complexes, asymptomatic, on amiodarone drip 2. Chronic systolic heart failure and nonischemic cardiomyopathy with EF 20-25% per echocardiogram 04/2017, on appropriate medical management  Recommendations: 1. Continue current therapy 2. Discussed ICD with the patient, which [name] discussed with him at previous appointments, but the patient deferred. 3. Continue amiodarone drip for now with plans to convert to oral in the morning. 4. No further cardiac diagnostics at this time.   Signed: Clabe Seal 07/29/2017, 1:15 PM

## 2017-07-29 NOTE — H&P (Signed)
[facility] [place] NAME: Stephen [name]    MR#:  [id number]  DATE OF BIRTH:  October 31, 1966  DATE OF ADMISSION:  07/29/2017  PRIMARY CARE PHYSICIAN: [name], MD   REQUESTING/REFERRING PHYSICIAN: Dr. Merlyn Lot  CHIEF COMPLAINT:   Chief Complaint  Patient presents with  . Back Pain  . Shortness of Breath    HISTORY OF PRESENT ILLNESS:  Stephen [name]  is a 51 y.o. male with a known history of Hypertension, nonischemic cardiomyopathy ejection fraction of 45%, chronic systolic CHF, history of tobacco abuse/COPD who presents to the hospital due to shortness of breath and right shoulder pain. Patient says yesterday he developed some right shoulder pain and he took some muscle relaxants and pain medications but did not improve his symptoms. He then became short of breath and states short of breath most of the night and therefore came to the ER for further evaluation today. In the emergency room on the monitor patient was noticed to have multiple episodes of nonsustained ventricular tachycardia. He denies any chest pain, palpitations, dizziness, diaphoresis, syncope or any other associated symptoms. Given his ongoing ectopy and his underlying severe cardiomyopathy hospitalist services were contacted further treatment and evaluation.  PAST MEDICAL HISTORY:   Past Medical History:  Diagnosis Date  . CHF (congestive heart failure) (HCC)    nonischemic cardiomyopathy, EF 25%  . Hypertension   . TIA (transient ischemic attack) 06/21/16    PAST SURGICAL HISTORY:   Past Surgical History:  Procedure Laterality Date  . KNEE SURGERY Right   . VASECTOMY      SOCIAL HISTORY:   Social History  Substance Use Topics  . Smoking status: Former Smoker    Packs/day: 0.25    Years: 20.00    Types: Cigarettes    Quit date: 02/20/2016  . Smokeless tobacco: Never Used  . Alcohol use 0.0 oz/week     Comment: beer occasional    FAMILY  HISTORY:   Family History  Problem Relation Age of Onset  . Hypertension Mother   . Heart failure Mother   . Hypertension Father   . CAD Father   . Heart attack Father     DRUG ALLERGIES:   Allergies  Allergen Reactions  . Lisinopril Cough    REVIEW OF SYSTEMS:   Review of Systems  Constitutional: Negative for chills and fever.  HENT: Negative for congestion and tinnitus.   Eyes: Negative for blurred vision and double vision.  Respiratory: Positive for shortness of breath. Negative for cough and wheezing.   Cardiovascular: Negative for chest pain, orthopnea and PND.  Gastrointestinal: Negative for abdominal pain, diarrhea, nausea and vomiting.  Genitourinary: Negative for dysuria and hematuria.  Musculoskeletal: Positive for back pain.  Neurological: Negative for dizziness, sensory change and focal weakness.  All other systems reviewed and are negative.   MEDICATIONS AT HOME:   Prior to Admission medications   Medication Sig Start Date End Date Taking? Authorizing Provider  acetaminophen (TYLENOL) 500 MG tablet Take 1 capsule by mouth every 4 (four) hours as needed for fever.   Yes [provider]  albuterol (PROVENTIL HFA;VENTOLIN HFA) 108 (90 Base) MCG/ACT inhaler Inhale 2 puffs into the lungs every 4 (four) hours as needed for wheezing or shortness of breath. 03/22/17  Yes [name], MD  atorvastatin (LIPITOR) 40 MG tablet Take 1 tablet (40 mg total) by mouth daily at 6 PM. Patient taking differently: Take 40 mg by mouth daily.  06/22/16  Yes [name], MD  Benzocaine (ANBESOL) 10 % LIQD Use as directed 1 application in the mouth or throat 4 (four) times daily as needed.   Yes [provider]  Camphor-Eucalyptus-Menthol (CHEST RUB) 4.8-1.2-2.6 % OINT Apply 1 application topically as needed.   Yes [provider]  carvedilol (COREG) 6.25 MG tablet Take 6.25 mg by mouth 2 (two) times daily. 06/20/16  Yes [provider]   clopidogrel (PLAVIX) 75 MG tablet Take 1 tablet (75 mg total) by mouth daily. 06/22/16  Yes [name], MD  fluticasone (FLONASE) 50 MCG/ACT nasal spray Place 2 sprays into both nostrils daily. 06/20/17  Yes [name], MD  Fluticasone-Salmeterol (ADVAIR DISKUS) 250-50 MCG/DOSE AEPB Inhale 1 puff into the lungs 2 (two) times daily. 06/20/17 06/20/18 Yes [name], MD  furosemide (LASIX) 40 MG tablet Take 1 tablet (40 mg total) by mouth 2 (two) times daily. 05/21/17  Yes [name], MD  Multiple Vitamin (MULTIVITAMIN WITH MINERALS) TABS tablet Take 1 tablet by mouth daily.   Yes [provider]  pantoprazole (PROTONIX) 40 MG tablet Take 1 tablet (40 mg total) by mouth daily. 06/20/17  Yes [name], MD  potassium chloride 20 MEQ TBCR Take 20 mEq by mouth daily. 05/21/17  Yes [name], MD  tiotropium (SPIRIVA HANDIHALER) 18 MCG inhalation capsule Place 1 capsule (18 mcg total) into inhaler and inhale daily. 06/20/17  Yes [name], MD  Tiotropium Bromide Monohydrate (SPIRIVA RESPIMAT) 2.5 MCG/ACT AERS Inhale 2.5 Act into the lungs 2 (two) times daily. 07/25/17  Yes Kasa, Maretta Bees, MD  sacubitril-valsartan (ENTRESTO) 49-51 MG Take 1 tablet by mouth 2 (two) times daily. Discontinue current valsartan (diovan) Patient not taking: Reported on 07/29/2017 09/25/16   [name] A, FNP      VITAL SIGNS:  Blood pressure (!) 147/118, pulse 90, temperature 97.6 F (36.4 C), temperature source Oral, resp. rate 19, height 6\' 1"  (1.854 m), weight 104.3 kg (230 lb), SpO2 93 %.  PHYSICAL EXAMINATION:  Physical Exam  GENERAL:  51 y.o.-year-old patient sitting up in bed in no acute distress.  EYES: Pupils equal, round, reactive to light and accommodation. No scleral icterus. Extraocular muscles intact.  HEENT: Head atraumatic, normocephalic. Oropharynx and nasopharynx clear. No oropharyngeal erythema, moist oral mucosa  NECK:  Supple, no jugular venous distention.  No thyroid enlargement, no tenderness.  LUNGS: Prolonged inspiratory and expiratory phase, no wheezing, rales, rhonchi. No use of accessory muscles of respiration.  CARDIOVASCULAR: S1, S2 RRR. No murmurs, rubs, gallops, clicks.  ABDOMEN: Soft, nontender, nondistended. Bowel sounds present. No organomegaly or mass.  EXTREMITIES: No pedal edema, cyanosis, or clubbing. + 2 pedal & radial pulses b/l.   NEUROLOGIC: Cranial nerves II through XII are intact. No focal Motor or sensory deficits appreciated b/l PSYCHIATRIC: The patient is alert and oriented x 3.  SKIN: No obvious rash, lesion, or ulcer.   LABORATORY PANEL:   CBC  Recent Labs Lab 07/29/17 0809  WBC 7.9  HGB 15.2  HCT 46.4  PLT 147*   ------------------------------------------------------------------------------------------------------------------  Chemistries   Recent Labs Lab 07/29/17 0809  NA 139  K 3.7  CL 109  CO2 23  GLUCOSE 101*  BUN 15  CREATININE 1.24  CALCIUM 9.0   ------------------------------------------------------------------------------------------------------------------  Cardiac Enzymes  Recent Labs Lab 07/29/17 0809  TROPONINI 0.03*   ------------------------------------------------------------------------------------------------------------------  RADIOLOGY:  Dg Chest 2 View  Result Date: 07/29/2017 CLINICAL DATA:  Shortness of breath.  Hypertension. EXAM: CHEST  2 VIEW COMPARISON:  June 18, 2017 FINDINGS: There is interstitial pulmonary edema. No airspace consolidation. There is cardiomegaly with pulmonary venous hypertension. No adenopathy. No bone lesions. IMPRESSION: Findings indicative of a degree of congestive heart failure. No airspace consolidation. Electronically Signed   By: Lowella Grip III M.D.   On: 07/29/2017 07:59   Dg Abdomen 1 View  Result Date: 07/29/2017 CLINICAL DATA:  Right flank pain EXAM: ABDOMEN - 1 VIEW COMPARISON:  CT abdomen and pelvis April 01, 2016  FINDINGS: There is mild stool in the colon. There is no bowel dilatation or air-fluid level to suggest bowel obstruction. No free air. No abnormal calcifications. IMPRESSION: No demonstrable bowel obstruction or free air. Electronically Signed   By: Lowella Grip III M.D.   On: 07/29/2017 08:00     IMPRESSION AND PLAN:   51 year old male with past medical history of COPD, chronic systolic CHF, nonischemic cardio myopathy ejection fraction of 25% who presented to the hospital due to atypical back pain and also shortness of breath and noted to have episodes of nonsustained ventricular tachycardia.  1. Nonsustained ventricular tachycardia/ectopy off unknown significance-this is secondary to his severe cardiomyopathy ejection fraction of 20-25%. - pt's Potassium and Mg. Level are normal.   -Patient although is clinically asymptomatic with no chest pain, limitations or syncope. -We'll load the patient with IV amiodarone, consult cardiology. -Observe on telemetry, cycle his cardiac markers.  2. History of chronic systolic CHF-patient clinically is not in congestive heart failure presently.  -continue Lasix, carvedilol  3. History of severe COPD-continue Spiriva, Dulera. No acute exacerbation.  4. Severe nonischemic cardiomyopathy with ejection fraction of 25%-patient would be a good candidate for an AICD and this has been discussed with the patient by his cardiologist and likely needs to be further addressed now with his arrhythmia as mentioned above. -Continue supportive care for now.  5. Essential hypertension-continue carvedilol.  6. GERD-continue Protonix  7. Hyperlipidemia-continue atorvastatin.   All the records are reviewed and case discussed with ED provider. Management plans discussed with the patient, family and they are in agreement.  CODE STATUS: Full  TOTAL TIME TAKING CARE OF THIS PATIENT: 45 minutes.    Henreitta Leber M.D on 07/29/2017 at 9:38 AM  Between 7am to  6pm - Pager - [phone]  After 6pm go to [email or web address] - password [email or web address] [facility]  [facility]  Office  [phone]  CC: Primary care physician; [name], MD

## 2017-07-29 NOTE — ED Provider Notes (Signed)
[facility] Emergency Department Provider Note    First MD Initiated Contact with Patient 07/29/17 [phone]     (approximate)  I have reviewed the triage vital signs and the nursing notes.   HISTORY  Chief Complaint Back Pain and Shortness of Breath    HPI Stephen [name] is a 51 y.o. male with a history of congestive heart failure with nonischemic cardiomyopathy as well as elevated blood pressure presents withchief complaint is right posterior back pain as well as shortness of breath that started last night. Patient states is also noticed worsening swelling in his legs. Is also having some orthopnea is worse than usual. Denies any fevers. Has had some thick productive cough. Denies any chest pain radiating up into his neck or shoulders. No nausea or vomiting. Since he has not moved his bowels in 4 days.   Past Medical History:  Diagnosis Date  . CHF (congestive heart failure) (HCC)    nonischemic cardiomyopathy, EF 25%  . Hypertension   . TIA (transient ischemic attack) 06/21/16   Family History  Problem Relation Age of Onset  . Hypertension Mother   . Heart failure Mother   . Hypertension Father   . CAD Father   . Heart attack Father    Past Surgical History:  Procedure Laterality Date  . KNEE SURGERY Right   . VASECTOMY     Patient Active Problem List   Diagnosis Date Noted  . Ventricular tachycardia ([place]) 07/29/2017  . COPD (chronic obstructive pulmonary disease) ([place]) 07/04/2017  . Chronic systolic CHF (congestive heart failure) ([place]) 05/20/2017  . TIA (transient ischemic attack) 06/22/2016  . HTN (hypertension) 06/22/2016  . Acute on chronic systolic CHF (congestive heart failure) ([place]) 06/04/2016      Prior to Admission medications   Medication Sig Start Date End Date Taking? Authorizing Provider  acetaminophen (TYLENOL) 500 MG tablet Take 1 capsule by mouth every 4 (four) hours as needed for fever.   Yes [provider]    albuterol (PROVENTIL HFA;VENTOLIN HFA) 108 (90 Base) MCG/ACT inhaler Inhale 2 puffs into the lungs every 4 (four) hours as needed for wheezing or shortness of breath. 03/22/17  Yes [name], MD  atorvastatin (LIPITOR) 40 MG tablet Take 1 tablet (40 mg total) by mouth daily at 6 PM. Patient taking differently: Take 40 mg by mouth daily.  06/22/16  Yes [name], MD  Benzocaine (ANBESOL) 10 % LIQD Use as directed 1 application in the mouth or throat 4 (four) times daily as needed.   Yes [provider]  Camphor-Eucalyptus-Menthol (CHEST RUB) 4.8-1.2-2.6 % OINT Apply 1 application topically as needed.   Yes [provider]  carvedilol (COREG) 6.25 MG tablet Take 6.25 mg by mouth 2 (two) times daily. 06/20/16  Yes [provider]  clopidogrel (PLAVIX) 75 MG tablet Take 1 tablet (75 mg total) by mouth daily. 06/22/16  Yes [name], MD  fluticasone (FLONASE) 50 MCG/ACT nasal spray Place 2 sprays into both nostrils daily. 06/20/17  Yes [name], MD  Fluticasone-Salmeterol (ADVAIR DISKUS) 250-50 MCG/DOSE AEPB Inhale 1 puff into the lungs 2 (two) times daily. 06/20/17 06/20/18 Yes [name], MD  furosemide (LASIX) 40 MG tablet Take 1 tablet (40 mg total) by mouth 2 (two) times daily. 05/21/17  Yes [name], MD  Multiple Vitamin (MULTIVITAMIN WITH MINERALS) TABS tablet Take 1 tablet by mouth daily.   Yes [provider]  pantoprazole (PROTONIX) 40 MG tablet Take 1 tablet (40 mg  total) by mouth daily. 06/20/17  Yes [name], MD  potassium chloride 20 MEQ TBCR Take 20 mEq by mouth daily. 05/21/17  Yes [name], MD  tiotropium (SPIRIVA HANDIHALER) 18 MCG inhalation capsule Place 1 capsule (18 mcg total) into inhaler and inhale daily. 06/20/17  Yes [name], MD  Tiotropium Bromide Monohydrate (SPIRIVA RESPIMAT) 2.5 MCG/ACT AERS Inhale 2.5 Act into the lungs 2 (two) times daily. 07/25/17  Yes Kasa, Maretta Bees, MD   sacubitril-valsartan (ENTRESTO) 49-51 MG Take 1 tablet by mouth 2 (two) times daily. Discontinue current valsartan (diovan) Patient not taking: Reported on 07/29/2017 09/25/16   [name], FNP    Allergies Lisinopril    Social History Social History  Substance Use Topics  . Smoking status: Former Smoker    Packs/day: 0.25    Years: 20.00    Types: Cigarettes    Quit date: 02/20/2016  . Smokeless tobacco: Never Used  . Alcohol use 0.0 oz/week     Comment: beer occasional    Review of Systems Patient denies headaches, rhinorrhea, blurry vision, numbness, shortness of breath, chest pain, edema, cough, abdominal pain, nausea, vomiting, diarrhea, dysuria, fevers, rashes or hallucinations unless otherwise stated above in HPI. ____________________________________________   PHYSICAL EXAM:  VITAL SIGNS: Vitals:   07/29/17 0830 07/29/17 0900  BP: (!) 153/112 (!) 147/118  Pulse: (!) 126 90  Resp: (!) 25 19  Temp:      Constitutional: Alert and oriented. Well appearing and in no acute distress. Eyes: Conjunctivae are normal.  Head: Atraumatic. Nose: No congestion/rhinnorhea. Mouth/Throat: Mucous membranes are moist.   Neck: No stridor. Painless ROM.  Cardiovascular: Normal rate, regular rhythm. Grossly normal heart sounds.  Good peripheral circulation. Respiratory: inspiratory crackles to mid lungs posteriorly,  diminished breathsounds on the right.  Gastrointestinal: Soft and nontender. No distention. No abdominal bruits. No CVA tenderness. Genitourinary:  Musculoskeletal: No lower extremity tenderness nor edema.  No joint effusions. Neurologic:  Normal speech and language. No gross focal neurologic deficits are appreciated. No facial droop Skin:  Skin is warm, dry and intact. No rash noted. Psychiatric: Mood and affect are normal. Speech and behavior are normal.  ____________________________________________   LABS (all labs ordered are listed, but only abnormal  results are displayed)  Results for orders placed or performed during the hospital encounter of 07/29/17 (from the past 24 hour(s))  CBC with Differential/Platelet     Status: Abnormal   Collection Time: 07/29/17  8:09 AM  Result Value Ref Range   WBC 7.9 3.8 - 10.6 K/uL   RBC 5.26 4.40 - 5.90 MIL/uL   Hemoglobin 15.2 13.0 - 18.0 g/dL   HCT 46.4 40.0 - 52.0 %   MCV 88.1 80.0 - 100.0 fL   MCH 28.9 26.0 - 34.0 pg   MCHC 32.8 32.0 - 36.0 g/dL   RDW 14.9 (H) 11.5 - 14.5 %   Platelets 147 (L) 150 - 440 K/uL   Neutrophils Relative % 58 %   Neutro Abs 4.6 1.4 - 6.5 K/uL   Lymphocytes Relative 29 %   Lymphs Abs 2.3 1.0 - 3.6 K/uL   Monocytes Relative 8 %   Monocytes Absolute 0.6 0.2 - 1.0 K/uL   Eosinophils Relative 3 %   Eosinophils Absolute 0.2 0 - 0.7 K/uL   Basophils Relative 2 %   Basophils Absolute 0.1 0 - 0.1 K/uL  Basic metabolic panel     Status: Abnormal   Collection Time: 07/29/17  8:09 AM  Result Value Ref  Range   Sodium 139 135 - 145 mmol/L   Potassium 3.7 3.5 - 5.1 mmol/L   Chloride 109 101 - 111 mmol/L   CO2 23 22 - 32 mmol/L   Glucose, Bld 101 (H) 65 - 99 mg/dL   BUN 15 6 - 20 mg/dL   Creatinine, Ser 1.24 0.61 - 1.24 mg/dL   Calcium 9.0 8.9 - 10.3 mg/dL   GFR calc non Af Amer >60 >60 mL/min   GFR calc Af Amer >60 >60 mL/min   Anion gap 7 5 - 15  Troponin I     Status: Abnormal   Collection Time: 07/29/17  8:09 AM  Result Value Ref Range   Troponin I 0.03 (HH) <0.03 ng/mL  Brain natriuretic peptide     Status: Abnormal   Collection Time: 07/29/17  8:09 AM  Result Value Ref Range   B Natriuretic Peptide 1,727.0 (H) 0.0 - 100.0 pg/mL   ____________________________________________  EKG My review and personal interpretation at Time: 7:52   Indication: right flank pain  Rate: 90  Rhythm: sinus Axis: left Other: borderline prolonged QT, lbbb without sgarbossa, t wave inversions in I, aVL, V6 that are unchanged from  07/21/16 ____________________________________________  RADIOLOGY  I personally reviewed all radiographic images ordered to evaluate for the above acute complaints and reviewed radiology reports and findings.  These findings were personally discussed with the patient.  Please see medical record for radiology report.  ____________________________________________   PROCEDURES  Procedure(s) performed:  Procedures    Critical Care performed: yes CRITICAL CARE Performed by: Merlyn Lot   Total critical care time: 40 minutes  Critical care time was exclusive of separately billable procedures and treating other patients.  Critical care was necessary to treat or prevent imminent or life-threatening deterioration.  Critical care was time spent personally by me on the following activities: development of treatment plan with patient and/or surrogate as well as nursing, discussions with consultants, evaluation of patient's response to treatment, examination of patient, obtaining history from patient or surrogate, ordering and performing treatments and interventions, ordering and review of laboratory studies, ordering and review of radiographic studies, pulse oximetry and re-evaluation of patient's condition.  ____________________________________________   INITIAL IMPRESSION / ASSESSMENT AND PLAN / ED COURSE  Pertinent labs & imaging results that were available during my care of the patient were reviewed by me and considered in my medical decision making (see chart for details).  DDX: ACS, pericarditis, esophagitis, boerhaaves, pe, dissection, pna, bronchitis, costochondritis   Mareon Robinette Madariaga is a 51 y.o. who presents to the ED with right flank pain and chest pain as described above. Patient well-appearing and in no acute distress. Does not have any evidence of overt just of heart failure at this time. EKG shows some chronic changes but no evidence of active ischemia.  The patient  will be placed on continuous pulse oximetry and telemetry for monitoring.  Laboratory evaluation will be sent to evaluate for the above complaints.     Clinical Course as of Jul 29 954  Mon Jul 29, 2017  0801 Patient was just observed to have multiple runs of 510 and 15 beats ventricular tachycardia. Patient states he does intermittently have palpitations. States that there has been no change in his pain.  Did have a pulse during these episodes. Does not have a pacemaker.  [PR]    Clinical Course User Index [PR] Merlyn Lot, MD    ----------------------------------------- 8:54 AM on 07/29/2017 -----------------------------------------  Spoke with cardiology, Dr.  Paraschos, who agrees with plan for IV amiodarone to suppress ectopy and admission for hemodynamic monitoring and further workup chest pain.  Spoke with Dr. Benjie Karvonen who kindly agrees to admit patient for further evaluation and management.  Have discussed with the patient and available family all diagnostics and treatments performed thus far and all questions were answered to the best of my ability. The patient demonstrates understanding and agreement with plan.  ____________________________________________   FINAL CLINICAL IMPRESSION(S) / ED DIAGNOSES  Final diagnoses:  Non-sustained ventricular tachycardia (HCC)  Chest pain, unspecified type      NEW MEDICATIONS STARTED DURING THIS VISIT:  New Prescriptions   No medications on file     Note:  This document was prepared using Dragon voice recognition software and may include unintentional dictation errors.    Merlyn Lot, MD 07/29/17 [phone]

## 2017-07-29 NOTE — Progress Notes (Signed)
  Amiodarone Drug - Drug Interaction Consult Note  Recommendations: Watch medications checked below.   Amiodarone is metabolized by the cytochrome P450 system and therefore has the potential to cause many drug interactions. Amiodarone has an average plasma half-life of 50 days (range 20 to 100 days).   There is potential for drug interactions to occur several weeks or months after stopping treatment and the onset of drug interactions may be slow after initiating amiodarone.   [x]  Statins: Increased risk of myopathy. Simvastatin- restrict dose to 20mg  daily. Other statins: counsel patients to report any muscle pain or weakness immediately. Patient on Atorvastatin 40mg  daily  []  Anticoagulants: Amiodarone can increase anticoagulant effect. Consider warfarin dose reduction. Patients should be monitored closely and the dose of anticoagulant altered accordingly, remembering that amiodarone levels take several weeks to stabilize.  []  Antiepileptics: Amiodarone can increase plasma concentration of phenytoin, the dose should be reduced. Note that small changes in phenytoin dose can result in large changes in levels. Monitor patient and counsel on signs of toxicity.  [x]  Beta blockers: increased risk of bradycardia, AV block and myocardial depression. Sotalol - avoid concomitant use.   Patient on Carvedilol 6.25mg  bid  []   Calcium channel blockers (diltiazem and verapamil): increased risk of bradycardia, AV block and myocardial depression.  []   Cyclosporine: Amiodarone increases levels of cyclosporine. Reduced dose of cyclosporine is recommended.  []  Digoxin dose should be halved when amiodarone is started.  [x]  Diuretics: increased risk of cardiotoxicity if hypokalemia occurs. Patient on Furosemide 40mg  PO bid  []  Oral hypoglycemic agents (glyburide, glipizide, glimepiride): increased risk of hypoglycemia. Patient's glucose levels should be monitored closely when initiating amiodarone therapy.    []  Drugs that prolong the QT interval:  Torsades de pointes risk may be increased with concurrent use - avoid if possible.  Monitor QTc, also keep magnesium/potassium WNL if concurrent therapy can't be avoided. Marland Kitchen Antibiotics: e.g. fluoroquinolones, erythromycin. . Antiarrhythmics: e.g. quinidine, procainamide, disopyramide, sotalol. . Antipsychotics: e.g. phenothiazines, haloperidol.  . Lithium, tricyclic antidepressants, and methadone.   Thank You,  Chinita Greenland PharmD Clinical Pharmacist7/30/2018

## 2017-07-29 NOTE — Care Management (Signed)
Placed in observation for non sustained V tach. present, patient does not wish to consider ICD .  Independent in all adls, denies issues accessing medical care, obtaining medications or with transportation.  Current withPCP.  No discharge needs identified at present by care manager or members of care team

## 2017-07-29 NOTE — ED Notes (Signed)
Pads applied to pt due to runs of VTach

## 2017-07-29 NOTE — Progress Notes (Addendum)
Patient c/o back pain just below right shoulder area. Norco PRN was ordered and given however patient still c/o pain with little to no relief. MD notified. Orders to give 30 mg IV Toradol and Xray of thoracic spine ordered. 2 mg IV morphine ordered q6hr PRN if Toradol is not effective. 5 mg PO Flexeril also ordered TID PRN if patient needs. Will continue to assess and monitor.

## 2017-07-29 NOTE — ED Triage Notes (Signed)
Patient with a productive cough of brown phlegm and back pain. States back pain is lower, right greater than left. Able to speak in complete sentences at this time. States he has times of shortness of breath as well. Denies CP with the shortness of breath.

## 2017-07-30 LAB — CBC
HCT: 49.4 % (ref 40.0–52.0)
Hemoglobin: 16.3 g/dL (ref 13.0–18.0)
MCH: 29.3 pg (ref 26.0–34.0)
MCHC: 33 g/dL (ref 32.0–36.0)
MCV: 88.8 fL (ref 80.0–100.0)
PLATELETS: 171 10*3/uL (ref 150–440)
RBC: 5.56 MIL/uL (ref 4.40–5.90)
RDW: 15 % — AB (ref 11.5–14.5)
WBC: 9.5 10*3/uL (ref 3.8–10.6)

## 2017-07-30 LAB — BASIC METABOLIC PANEL
ANION GAP: 7 (ref 5–15)
BUN: 24 mg/dL — ABNORMAL HIGH (ref 6–20)
CHLORIDE: 106 mmol/L (ref 101–111)
CO2: 26 mmol/L (ref 22–32)
Calcium: 9.2 mg/dL (ref 8.9–10.3)
Creatinine, Ser: 1.69 mg/dL — ABNORMAL HIGH (ref 0.61–1.24)
GFR calc Af Amer: 52 mL/min — ABNORMAL LOW (ref >60)
GFR, EST NON AFRICAN AMERICAN: 45 mL/min — AB (ref >60)
GLUCOSE: 128 mg/dL — AB (ref 65–99)
Potassium: 4.5 mmol/L (ref 3.5–5.1)
Sodium: 139 mmol/L (ref 135–145)

## 2017-07-30 MED ORDER — POLYETHYLENE GLYCOL 3350 17 G PO PACK
17.0000 g | PACK | Freq: Every day | ORAL | Status: DC
  Administered 2017-07-30: 17 g via ORAL
  Filled 2017-07-30 (×2): qty 1

## 2017-07-30 MED ORDER — LACTULOSE 10 GM/15ML PO SOLN: 30.0000 g | Freq: Two times a day (BID) | ORAL | Status: DC | PRN

## 2017-07-30 MED ORDER — FUROSEMIDE 10 MG/ML IJ SOLN
40.0000 mg | Freq: Two times a day (BID) | INTRAMUSCULAR | Status: DC
  Administered 2017-07-30 – 2017-07-31 (×3): 40 mg via INTRAVENOUS
  Filled 2017-07-30 (×3): qty 4

## 2017-07-30 MED ORDER — SACUBITRIL-VALSARTAN 49-51 MG PO TABS
1.0000 | ORAL_TABLET | Freq: Two times a day (BID) | ORAL | Status: DC
  Administered 2017-07-30 – 2017-07-31 (×2): 1 via ORAL
  Filled 2017-07-30 (×3): qty 1

## 2017-07-30 MED ORDER — AMIODARONE HCL 200 MG PO TABS
400.0000 mg | ORAL_TABLET | Freq: Two times a day (BID) | ORAL | Status: DC
  Administered 2017-07-30 – 2017-07-31 (×3): 400 mg via ORAL
  Filled 2017-07-30 (×3): qty 2

## 2017-07-30 MED ORDER — SODIUM CHLORIDE 0.9% FLUSH
3.0000 mL | Freq: Two times a day (BID) | INTRAVENOUS | Status: DC
  Administered 2017-07-30 – 2017-07-31 (×2): 3 mL via INTRAVENOUS

## 2017-07-30 MED ORDER — MAGNESIUM CITRATE PO SOLN
1.0000 | Freq: Once | ORAL | Status: AC
  Administered 2017-07-30: 1 via ORAL
  Filled 2017-07-30: qty 296

## 2017-07-30 NOTE — Progress Notes (Addendum)
[facility] NAME: Stephen [name]    MR#:  [id number]  DATE OF BIRTH:  April 02, 1966  SUBJECTIVE:   Patient having some right-sided shoulder/back pain which has improved since yesterday. No further episodes of ventricular tachycardia. Denies any chest pain, shortness of breath. Seen by cardiology and plan to wean off IV amiodarone drip and placed on oral amiodarone.  REVIEW OF SYSTEMS:    Review of Systems  Constitutional: Negative for chills and fever.  HENT: Negative for congestion and tinnitus.   Eyes: Negative for blurred vision and double vision.  Respiratory: Negative for cough, shortness of breath and wheezing.   Cardiovascular: Positive for leg swelling. Negative for chest pain, orthopnea and PND.  Gastrointestinal: Negative for abdominal pain, diarrhea, nausea and vomiting.  Genitourinary: Negative for dysuria and hematuria.  Musculoskeletal: Positive for back pain.  Neurological: Negative for dizziness, sensory change and focal weakness.  All other systems reviewed and are negative.   Nutrition: heart Healthy Tolerating Diet: Yes Tolerating PT: Ambulatory  DRUG ALLERGIES:   Allergies  Allergen Reactions  . Lisinopril Cough    VITALS:  Blood pressure 125/87, pulse 67, temperature 97.7 F (36.5 C), temperature source Oral, resp. rate 18, height 6\' 1"  (1.854 m), weight 104.3 kg (230 lb), SpO2 99 %.  PHYSICAL EXAMINATION:   Physical Exam  GENERAL:  51 y.o.-year-old patient lying in bed in no acute distress.  EYES: Pupils equal, round, reactive to light and accommodation. No scleral icterus. Extraocular muscles intact.  HEENT: Head atraumatic, normocephalic. Oropharynx and nasopharynx clear.  NECK:  Supple, no jugular venous distention. No thyroid enlargement, no tenderness.  LUNGS: Normal breath sounds bilaterally, no wheezing, rales, rhonchi. No use of accessory muscles of respiration.  CARDIOVASCULAR: S1, S2  normal. No murmurs, rubs, or gallops.  ABDOMEN: Soft, nontender, nondistended. Bowel sounds present. No organomegaly or mass.  EXTREMITIES: No cyanosis, clubbing or edema b/l.    NEUROLOGIC: Cranial nerves II through XII are intact. No focal Motor or sensory deficits b/l.   PSYCHIATRIC: The patient is alert and oriented x 3.  SKIN: No obvious rash, lesion, or ulcer.    LABORATORY PANEL:   CBC  Recent Labs Lab 07/30/17 0421  WBC 9.5  HGB 16.3  HCT 49.4  PLT 171   ------------------------------------------------------------------------------------------------------------------  Chemistries   Recent Labs Lab 07/30/17 0421  NA 139  K 4.5  CL 106  CO2 26  GLUCOSE 128*  BUN 24*  CREATININE 1.69*  CALCIUM 9.2   ------------------------------------------------------------------------------------------------------------------  Cardiac Enzymes  Recent Labs Lab 07/29/17 1830  TROPONINI 0.03*   ------------------------------------------------------------------------------------------------------------------  RADIOLOGY:  Dg Chest 2 View  Result Date: 07/29/2017 CLINICAL DATA:  Shortness of breath.  Hypertension. EXAM: CHEST  2 VIEW COMPARISON:  June 18, 2017 FINDINGS: There is interstitial pulmonary edema. No airspace consolidation. There is cardiomegaly with pulmonary venous hypertension. No adenopathy. No bone lesions. IMPRESSION: Findings indicative of a degree of congestive heart failure. No airspace consolidation. Electronically Signed   By: Lowella Grip III M.D.   On: 07/29/2017 07:59   Dg Thoracic Spine 2 View  Result Date: 07/29/2017 CLINICAL DATA:  51 year old male with back pain. EXAM: THORACIC SPINE 2 VIEWS COMPARISON:  Chest radiograph dated 07/29/2017 FINDINGS: There is no acute fracture or subluxation of the thoracic spine. There is mild chronic degenerative changes. The visualized soft tissues appear unremarkable. The heart is only partially visualized.  IMPRESSION: No acute/ traumatic thoracic spine pathology. Mild degenerative changes. Electronically  Signed   By: Anner Crete M.D.   On: 07/29/2017 19:36   Dg Abdomen 1 View  Result Date: 07/29/2017 CLINICAL DATA:  Right flank pain EXAM: ABDOMEN - 1 VIEW COMPARISON:  CT abdomen and pelvis April 01, 2016 FINDINGS: There is mild stool in the colon. There is no bowel dilatation or air-fluid level to suggest bowel obstruction. No free air. No abnormal calcifications. IMPRESSION: No demonstrable bowel obstruction or free air. Electronically Signed   By: Lowella Grip III M.D.   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  1. Nonsustained ventricular tachycardia/ectopy off unknown significance-this is secondary to his severe cardiomyopathy ejection fraction of 20-25%. -Patient's electrolytes are normal. Clinically patient is asymptomatic. -Seen by cardiology and plan to wean off amiodarone drip and place on oral amiodarone taper. Likely discharge home on oral amiodarone tomorrow. -Cardiac markers 3 have remained negative.  2. Acute on Chronic Systolic CHF - has some worsening LE edema and volume overload as per Cardiology.   - started on IV lasix and will follow I's and O's and Daily weights.  - cont  carvedilol, Entresto.   3. History of severe COPD-continue Spiriva, Dulera. No acute exacerbation.  4. Severe nonischemic cardiomyopathy with ejection fraction of 25%-patient would be a good candidate for an AICD and this has been discussed with the patient by his cardiologist and likely needs to be further addressed as outpatient with Cardiology.  5. Right Shoulder/upper back pain - X-ray of thoracic spine is  normal.  - cont. Supportive care w/ Toradol, Vicodin, Flexaril  6. Essential hypertension-continue carvedilol.  7. GERD-continue Protonix  8. Hyperlipidemia-continue atorvastatin.  Likely discharge home tomorrow.   All the records are reviewed and case discussed with Care Management/Social Worker. Management plans discussed with the patient, family and they are in agreement.  CODE STATUS: Full code  DVT Prophylaxis: Lovenox  TOTAL TIME TAKING CARE OF THIS PATIENT: 30 minutes.   POSSIBLE D/C IN 1-2 DAYS, DEPENDING ON CLINICAL CONDITION.   Henreitta Leber M.D on 07/30/2017 at 3:29 PM  Between 7am to 6pm - Pager - [phone]  After 6pm go to [email or web address] - Proofreader  [facility]  Office  [phone]  CC: Primary care physician; [name], MD

## 2017-07-30 NOTE — Progress Notes (Signed)
[facility] Cardiology  SUBJECTIVE: The patient reports feeling shortness of breath and peripheral edema today. He denies chest pain. He denies experiencing palpitations, heart racing, or lightheadedness.   Vitals:   07/29/17 2301 07/30/17 0444 07/30/17 0455 07/30/17 0804  BP: (!) 128/98 (!) 137/108 (!) 112/95 (!) 129/106  Pulse: 66 81 81 61  Resp:  20  20  Temp:  98.2 F (36.8 C)  97.7 F (36.5 C)  TempSrc:  Oral  Oral  SpO2:  95% 95% 92%  Weight:      Height:         Intake/Output Summary (Last 24 hours) at 07/30/17 0926 Last data filed at 07/30/17 0455  Gross per 24 hour  Intake              530 ml  Output              100 ml  Net              430 ml      PHYSICAL EXAM  General: Well developed, well nourished, in no acute distress HEENT:  Normocephalic and atramatic Neck:  No JVD.  Lungs: Increased effort of breathing, on supplemental oxygen, no wheezing, no distress Heart: HRRR . Normal S1 and S2 without gallops or murmurs.  Abdomen: Bowel sounds are positive, abdomen soft and non-tender  Msk:  Back normal, sitting on side of bed Extremities: 1-2+ bilateral lower extremity edema Neuro: Alert and oriented X 3. Psych:  Good affect, responds appropriately   LABS: Basic Metabolic Panel:  Recent Labs  07/29/17 0809 07/30/17 0421  NA 139 139  K 3.7 4.5  CL 109 106  CO2 23 26  GLUCOSE 101* 128*  BUN 15 24*  CREATININE 1.24 1.69*  CALCIUM 9.0 9.2   Liver Function Tests: No results for input(s): AST, ALT, ALKPHOS, BILITOT, PROT, ALBUMIN in the last 72 hours. No results for input(s): LIPASE, AMYLASE in the last 72 hours. CBC:  Recent Labs  07/29/17 0809 07/30/17 0421  WBC 7.9 9.5  NEUTROABS 4.6  --   HGB 15.2 16.3  HCT 46.4 49.4  MCV 88.1 88.8  PLT 147* 171   Cardiac Enzymes:  Recent Labs  07/29/17 1106 07/29/17 1451 07/29/17 1830  TROPONINI 0.03* 0.03* 0.03*   BNP: Invalid input(s): POCBNP D-Dimer: No results for input(s): DDIMER in the last  72 hours. Hemoglobin A1C: No results for input(s): HGBA1C in the last 72 hours. Fasting Lipid Panel: No results for input(s): CHOL, HDL, LDLCALC, TRIG, CHOLHDL, LDLDIRECT in the last 72 hours. Thyroid Function Tests: No results for input(s): TSH, T4TOTAL, T3FREE, THYROIDAB in the last 72 hours.  Invalid input(s): FREET3 Anemia Panel: No results for input(s): VITAMINB12, FOLATE, FERRITIN, TIBC, IRON, RETICCTPCT in the last 72 hours.  Dg Chest 2 View  Result Date: 07/29/2017 CLINICAL DATA:  Shortness of breath.  Hypertension. EXAM: CHEST  2 VIEW COMPARISON:  June 18, 2017 FINDINGS: There is interstitial pulmonary edema. No airspace consolidation. There is cardiomegaly with pulmonary venous hypertension. No adenopathy. No bone lesions. IMPRESSION: Findings indicative of a degree of congestive heart failure. No airspace consolidation. Electronically Signed   By: Lowella Grip III M.D.   On: 07/29/2017 07:59   Dg Thoracic Spine 2 View  Result Date: 07/29/2017 CLINICAL DATA:  51 year old male with back pain. EXAM: THORACIC SPINE 2 VIEWS COMPARISON:  Chest radiograph dated 07/29/2017 FINDINGS: There is no acute fracture or subluxation of the thoracic spine. There is mild chronic degenerative changes.  The visualized soft tissues appear unremarkable. The heart is only partially visualized. IMPRESSION: No acute/ traumatic thoracic spine pathology. Mild degenerative changes. Electronically Signed   By: Anner Crete M.D.   On: 07/29/2017 19:36   Dg Abdomen 1 View  Result Date: 07/29/2017 CLINICAL DATA:  Right flank pain EXAM: ABDOMEN - 1 VIEW COMPARISON:  CT abdomen and pelvis April 01, 2016 FINDINGS: There is mild stool in the colon. There is no bowel dilatation or air-fluid level to suggest bowel obstruction. No free air. No abnormal calcifications. IMPRESSION: No demonstrable bowel obstruction or free air. Electronically Signed   By: Lowella Grip III M.D.   On: 07/29/2017 08:00     Echo  EF 20-25%  TELEMETRY: Sinus rhythm,   ASSESSMENT AND PLAN:  Active Problems:   Ventricular tachycardia ([place])    1. Non-sustained wide QRS complexes, asymptomatic, on amiodarone drip 2. Chronic systolic heart failure and nonischemic cardiomyopathy with EF 20-25%, feels short of breath today, on supplemental oxygen, with peripheral edema; would benefit from IV Lasix.  Recommendations: 1. Continue current therapy. 2. Finish current amiodarone infusion; convert to PO amiodarone 400 mg BID today 3. Discontinue PO Lasix for now; start IV Lasix 40 mg BID with careful monitoring of renal status and electrolytes. 4. Dr. Saralyn Pilar spoke extensively with both the patient and his sister on the phone about the necessity for an ICD with the possibility of CRT. Patient was interested and wished to proceed with further outpatient discussion with [name] and Dr. Marcello Moores. The patient was also advised to avoid grilling and using his smoker as this tends to exacerbate his symptoms. 5. If patient's fluid status and breathing improves, may be discharged tomorrow with close follow-up with [name].  Clabe Seal, PA-C 07/30/2017 9:26 AM

## 2017-07-31 ENCOUNTER — Telehealth: Payer: Self-pay | Admitting: Internal Medicine

## 2017-07-31 LAB — BASIC METABOLIC PANEL
Anion gap: 8 (ref 5–15)
BUN: 24 mg/dL — AB (ref 6–20)
CALCIUM: 9.1 mg/dL (ref 8.9–10.3)
CHLORIDE: 104 mmol/L (ref 101–111)
CO2: 28 mmol/L (ref 22–32)
CREATININE: 1.65 mg/dL — AB (ref 0.61–1.24)
GFR calc Af Amer: 54 mL/min — ABNORMAL LOW (ref >60)
GFR calc non Af Amer: 46 mL/min — ABNORMAL LOW (ref >60)
Glucose, Bld: 117 mg/dL — ABNORMAL HIGH (ref 65–99)
Potassium: 4.2 mmol/L (ref 3.5–5.1)
SODIUM: 140 mmol/L (ref 135–145)

## 2017-07-31 MED ORDER — AMIODARONE HCL 400 MG PO TABS: 400.0000 mg | ORAL_TABLET | Freq: Two times a day (BID) | ORAL | 0 refills | Status: DC

## 2017-07-31 MED ORDER — FUROSEMIDE 40 MG PO TABS
40.0000 mg | ORAL_TABLET | Freq: Two times a day (BID) | ORAL | Status: DC
  Administered 2017-07-31: 40 mg via ORAL
  Filled 2017-07-31: qty 1

## 2017-07-31 MED ORDER — NITROGLYCERIN 0.4 MG SL SUBL: 0.4000 mg | SUBLINGUAL_TABLET | SUBLINGUAL | 0 refills | Status: DC | PRN

## 2017-07-31 NOTE — Plan of Care (Signed)
Problem: Bowel/Gastric: Goal: Will not experience complications related to bowel motility Outcome: Not Progressing Patient states that he has not had a bowel movement " in 6 days". Mag of citrate ordered. Will administer as ordered and continue to monitor.

## 2017-07-31 NOTE — Care Management (Signed)
No discharge needs identified by members of the care team. Delene Loll is not a new medication.  Was taking prior to admission

## 2017-07-31 NOTE — Progress Notes (Signed)
[facility] Cardiology  SUBJECTIVE: Patient reports feeling better this morning with improvement in his breathing. He states that he did have some mild chest discomfort last night while sitting up in bed, described as sharpness, lasting about 10 minutes, similar to what he has had in the past, which resolved on its own. He denies associated shortness of breath, nausea, diaphoresis, or radiation. He denies experiencing palpitations or heart racing.   Vitals:   07/30/17 1939 07/31/17 0610 07/31/17 0613 07/31/17 0757  BP: 111/69 (!) 194/140 (!) 142/84 103/75  Pulse: 77 (!) 25 79 67  Resp: 18 18  16   Temp: 97.6 F (36.4 C) 98.1 F (36.7 C)  98.2 F (36.8 C)  TempSrc: Oral Oral  Oral  SpO2: 100% 100%  93%  Weight:      Height:         Intake/Output Summary (Last 24 hours) at 07/31/17 1026 Last data filed at 07/31/17 0657  Gross per 24 hour  Intake              480 ml  Output             1675 ml  Net            -1195 ml      PHYSICAL EXAM  General: Well developed, well nourished, in no acute distress HEENT:  Normocephalic and atramatic Neck:  No JVD.  Lungs: Normal effort of breathing. No wheezing Heart: HRRR . Normal S1 and S2 without gallops or murmurs.  Abdomen: Bowel sounds are positive, abdomen soft and non-tender  Msk:  Back normal, sitting upright on side of bed Extremities: 1+ bilateral peripheral edema Neuro: Alert and oriented X 3. Psych:  Good affect, responds appropriately   LABS: Basic Metabolic Panel:  Recent Labs  07/30/17 0421 07/31/17 0435  NA 139 140  K 4.5 4.2  CL 106 104  CO2 26 28  GLUCOSE 128* 117*  BUN 24* 24*  CREATININE 1.69* 1.65*  CALCIUM 9.2 9.1   Liver Function Tests: No results for input(s): AST, ALT, ALKPHOS, BILITOT, PROT, ALBUMIN in the last 72 hours. No results for input(s): LIPASE, AMYLASE in the last 72 hours. CBC:  Recent Labs  07/29/17 0809 07/30/17 0421  WBC 7.9 9.5  NEUTROABS 4.6  --   HGB 15.2 16.3  HCT 46.4 49.4  MCV  88.1 88.8  PLT 147* 171   Cardiac Enzymes:  Recent Labs  07/29/17 1106 07/29/17 1451 07/29/17 1830  TROPONINI 0.03* 0.03* 0.03*   BNP: Invalid input(s): POCBNP D-Dimer: No results for input(s): DDIMER in the last 72 hours. Hemoglobin A1C: No results for input(s): HGBA1C in the last 72 hours. Fasting Lipid Panel: No results for input(s): CHOL, HDL, LDLCALC, TRIG, CHOLHDL, LDLDIRECT in the last 72 hours. Thyroid Function Tests: No results for input(s): TSH, T4TOTAL, T3FREE, THYROIDAB in the last 72 hours.  Invalid input(s): FREET3 Anemia Panel: No results for input(s): VITAMINB12, FOLATE, FERRITIN, TIBC, IRON, RETICCTPCT in the last 72 hours.  Dg Thoracic Spine 2 View  Result Date: 07/29/2017 CLINICAL DATA:  51 year old male with back pain. EXAM: THORACIC SPINE 2 VIEWS COMPARISON:  Chest radiograph dated 07/29/2017 FINDINGS: There is no acute fracture or subluxation of the thoracic spine. There is mild chronic degenerative changes. The visualized soft tissues appear unremarkable. The heart is only partially visualized. IMPRESSION: No acute/ traumatic thoracic spine pathology. Mild degenerative changes. Electronically Signed   By: Anner Crete M.D.   On: 07/29/2017 19:36  Echo EF 20-25%  TELEMETRY: Sinus rhythm, 74 bpm  ASSESSMENT AND PLAN:  Active Problems:   Ventricular tachycardia ([facility])    1. Nonsustained wide QRS complex tachycardia, without recurrence, asymptomatic, on oral amiodarone 2. Chronic systolic heart failure and nonischemic cardiomyopathy with EF 20-25%, no longer needing supplemental oxygen, breathing better, improvement in peripheral edema.  Recommendations: 1. Continue current therapy 2. Continue oral  amiodarone 400 mg twice daily 3. Patient likely able to go home today with very close follow-up with [name] 4. No further cardiac diagnostics  Sign off for now; call with any questions.  Clabe Seal, PA-C 07/31/2017 10:26 AM

## 2017-07-31 NOTE — Telephone Encounter (Signed)
Pt sister, [place] Constable, pt gives verbal ok to speak with her, would like for [place] to order for pt to have his PFT roday, as he is currently in the hospital, so he will not have to wait until October.

## 2017-07-31 NOTE — Telephone Encounter (Signed)
Spoke with patient's sister and patient is being D/C today and is wanting to know if he can move his PFT up to tomorrow, b/c he is off work.  Pt has missed so much time from work to due to office appointments and hospital stays that he is trying not to take anymore time off than possible.  Please advise if patient can move PFT from October to 08/01/17. Rhonda J Cobb

## 2017-07-31 NOTE — Progress Notes (Signed)
Patient discharged home as ordered,instructions explained and well understood,prescription given,vital signs within normal limits,escorted by staff member and sister via wheel chair

## 2017-07-31 NOTE — Discharge Summary (Signed)
[facility] NAME: Stephen [name]    MR#:  [id number]  DATE OF BIRTH:  01-26-1966  DATE OF ADMISSION:  07/29/2017   ADMITTING PHYSICIAN: Henreitta Leber, MD  DATE OF DISCHARGE: 07/31/2017  PRIMARY CARE PHYSICIAN: [name], MD   ADMISSION DIAGNOSIS:  Non-sustained ventricular tachycardia ([place]) [I47.2] Chest pain, unspecified type [R07.9] DISCHARGE DIAGNOSIS:  Active Problems:   Ventricular tachycardia ([place])  SECONDARY DIAGNOSIS:   Past Medical History:  Diagnosis Date  . CHF (congestive heart failure) (HCC)    nonischemic cardiomyopathy, EF 25%  . Hypertension   . 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  1. Nonsustained ventricular tachycardia/ectopy off unknown significance-this is secondary to his severe cardiomyopathy ejection fraction of 20-25%. -Patient's electrolytes are normal. Clinically patient is asymptomatic. -Seen by cardiology, weaned off amiodarone drip and placed on oral amiodarone 400 mg po bid per Dr. Saralyn Pilar. -Cardiac markers 3 have remained negative.  2. Acute on Chronic Systolic CHF - has some worsening LE edema and volume overload as per Cardiology.   - started on IV lasix and will follow I's and O's and Daily weights.  - cont  carvedilol, Entresto. Change to po lasix bid.  3. History of severe COPD-continue Spiriva, Dulera. No acute exacerbation.  4. Severe nonischemic cardiomyopathy with ejection fraction of 25%-patient would be a good candidate for an AICD and this has been discussed with the patient by his cardiologist and likely needs to be further addressed as outpatient with  Cardiology.  5. Right Shoulder/upper back pain - X-ray of thoracic spine is normal.  - cont. Supportive care w/ Toradol, Vicodin, Flexaril  6. Essential hypertension-continue carvedilol.  7. GERD-continue Protonix  8. Hyperlipidemia-continue atorvastatin. 9. ARF, due to ATN,  Cr. Is stable, f/u BMP as outpatient.  Discussed with Dr. Saralyn Pilar. DISCHARGE CONDITIONS:  Stable, discharge to home today. CONSULTS OBTAINED:  Treatment Team:  Isaias Cowman, MD DRUG ALLERGIES:   Allergies  Allergen Reactions  . Lisinopril Cough   DISCHARGE MEDICATIONS:   Allergies as of 07/31/2017      Reactions   Lisinopril Cough      Medication List    TAKE these medications   acetaminophen 500 MG tablet Commonly known as:  TYLENOL Take 1 capsule by mouth every 4 (four) hours as needed for fever.   albuterol 108 (90 Base) MCG/ACT inhaler Commonly known as:  PROVENTIL HFA;VENTOLIN HFA Inhale 2 puffs into the lungs every 4 (four) hours as needed for wheezing or shortness of breath.   amiodarone 400 MG tablet Commonly known as:  PACERONE Take 1 tablet (400 mg total) by mouth 2 (two) times daily.   ANBESOL 10 % Liqd Generic drug:  Benzocaine Use as directed 1 application in the mouth or throat 4 (four) times daily as needed.   atorvastatin 40 MG tablet Commonly known as:  LIPITOR Take 1 tablet (40 mg total) by mouth daily at 6 PM. What changed:  when to take this   carvedilol 6.25 MG tablet Commonly known as:  COREG Take 6.25 mg by mouth 2 (two) times daily.   Chest Rub 4.8-1.2-2.6 % Oint Apply 1 application topically as needed.   clopidogrel 75 MG  tablet Commonly known as:  PLAVIX Take 1 tablet (75 mg total) by mouth daily.   fluticasone 50 MCG/ACT nasal spray Commonly known as:  FLONASE Place 2 sprays into both nostrils daily.   Fluticasone-Salmeterol 250-50 MCG/DOSE Aepb Commonly known as:  ADVAIR DISKUS Inhale 1 puff into the lungs 2 (two) times daily.    furosemide 40 MG tablet Commonly known as:  LASIX Take 1 tablet (40 mg total) by mouth 2 (two) times daily.   multivitamin with minerals Tabs tablet Take 1 tablet by mouth daily.   nitroGLYCERIN 0.4 MG SL tablet Commonly known as:  NITROSTAT Place 1 tablet (0.4 mg total) under the tongue every 5 (five) minutes as needed for chest pain.   pantoprazole 40 MG tablet Commonly known as:  PROTONIX Take 1 tablet (40 mg total) by mouth daily.   Potassium Chloride ER 20 MEQ Tbcr Take 20 mEq by mouth daily.   sacubitril-valsartan 49-51 MG Commonly known as:  ENTRESTO Take 1 tablet by mouth 2 (two) times daily. Discontinue current valsartan (diovan)   tiotropium 18 MCG inhalation capsule Commonly known as:  SPIRIVA HANDIHALER Place 1 capsule (18 mcg total) into inhaler and inhale daily.   Tiotropium Bromide Monohydrate 2.5 MCG/ACT Aers Commonly known as:  SPIRIVA RESPIMAT Inhale 2.5 Act into the lungs 2 (two) times daily.        DISCHARGE INSTRUCTIONS:  See AVS.  If you experience worsening of your admission symptoms, develop shortness of breath, life threatening emergency, suicidal or homicidal thoughts you must seek medical attention immediately by calling 911 or calling your MD immediately  if symptoms less severe.  You Must read complete instructions/literature along with all the possible adverse reactions/side effects for all the Medicines you take and that have been prescribed to you. Take any new Medicines after you have completely understood and accpet all the possible adverse reactions/side effects.   Please note  You were cared for by a hospitalist during your hospital stay. If you have any questions about your discharge medications or the care you received while you were in the hospital after you are discharged, you can call the unit and asked to speak with the hospitalist on call if the hospitalist that took care of you is not available. Once you are discharged, your  primary care physician will handle any further medical issues. Please note that NO REFILLS for any discharge medications will be authorized once you are discharged, as it is imperative that you return to your primary care physician (or establish a relationship with a primary care physician if you do not have one) for your aftercare needs so that they can reassess your need for medications and monitor your lab values.    On the day of Discharge:  VITAL SIGNS:  Blood pressure 103/74, pulse 71, temperature 97.6 F (36.4 C), temperature source Oral, resp. rate 18, height 6\' 1"  (1.854 m), weight 230 lb (104.3 kg), SpO2 95 %. PHYSICAL EXAMINATION:  GENERAL:  51 y.o.-year-old patient lying in the bed with no acute distress.  EYES: Pupils equal, round, reactive to light and accommodation. No scleral icterus. Extraocular muscles intact.  HEENT: Head atraumatic, normocephalic. Oropharynx and nasopharynx clear.  NECK:  Supple, no jugular venous distention. No thyroid enlargement, no tenderness.  LUNGS: Normal breath sounds bilaterally, no wheezing, rales,rhonchi or crepitation. No use of accessory muscles of respiration.  CARDIOVASCULAR: S1, S2 normal. No murmurs, rubs, or gallops.  ABDOMEN: Soft, non-tender, non-distended. Bowel sounds present. No organomegaly or mass.  EXTREMITIES: No pedal edema, cyanosis, or clubbing.  NEUROLOGIC: Cranial nerves II through XII are intact. Muscle strength 5/5 in all extremities. Sensation intact. Gait not checked.  PSYCHIATRIC: The patient is alert and oriented x 3.  SKIN: No obvious rash, lesion, or ulcer.  DATA REVIEW:   CBC  Recent Labs Lab 07/30/17 0421  WBC 9.5  HGB 16.3  HCT 49.4  PLT 171    Chemistries   Recent Labs Lab 07/31/17 0435  NA 140  K 4.2  CL 104  CO2 28  GLUCOSE 117*  BUN 24*  CREATININE 1.65*  CALCIUM 9.1     Microbiology Results  No results found for this or any previous visit.  RADIOLOGY:  No results  found.   Management plans discussed with the patient, family and they are in agreement.  CODE STATUS: Full Code   TOTAL TIME TAKING CARE OF THIS PATIENT:32 minutes.    [name] M.D on 07/31/2017 at 3:34 PM  Between 7am to 6pm - Pager - [phone]  After 6pm go to [email or web address] - Proofreader  [facility]  Office  [phone]  CC: Primary care physician; [name], MD   Note: This dictation was prepared with Dragon dictation along with smaller phrase technology. Any transcriptional errors that result from this process are unintentional.

## 2017-07-31 NOTE — Discharge Instructions (Signed)
[facility] Failure [facility] appointment on August 20 2017 at 4:00pm with [name], [place]. Please call [phone] to reschedule.  Follow up [name] for AICD.

## 2017-08-01 ENCOUNTER — Ambulatory Visit: Payer: Commercial Managed Care - PPO | Attending: Internal Medicine

## 2017-08-01 DIAGNOSIS — J449 Chronic obstructive pulmonary disease, unspecified: Secondary | ICD-10-CM | POA: Diagnosis present

## 2017-08-01 MED ORDER — ALBUTEROL SULFATE (2.5 MG/3ML) 0.083% IN NEBU
2.5000 mg | INHALATION_SOLUTION | Freq: Once | RESPIRATORY_TRACT | Status: AC
  Administered 2017-08-01: 2.5 mg via RESPIRATORY_TRACT
  Filled 2017-08-01: qty 3

## 2017-08-01 NOTE — Telephone Encounter (Signed)
PFT has been scheduled for 1:30 today at [facility] . Pt advised to arrive at 1:15 and reviewed instructions over the phone to patient.  Appointment for PFT in Oct has been cancelled. Rhonda J Cobb Nothing else needed at this time. Rhonda J Cobb

## 2017-08-01 NOTE — Telephone Encounter (Signed)
Yes

## 2017-08-01 NOTE — Telephone Encounter (Signed)
Ok to schedule PFT today if they can do it per DK.

## 2017-08-20 ENCOUNTER — Ambulatory Visit: Payer: Commercial Managed Care - PPO | Admitting: Family

## 2017-08-29 ENCOUNTER — Encounter: Payer: Self-pay | Admitting: Family

## 2017-08-29 ENCOUNTER — Ambulatory Visit: Payer: Commercial Managed Care - PPO | Attending: Family | Admitting: Family

## 2017-08-29 VITALS — BP 118/73 | HR 60 | Resp 18 | Ht 74.0 in | Wt 239.4 lb

## 2017-08-29 DIAGNOSIS — Z87891 Personal history of nicotine dependence: Secondary | ICD-10-CM | POA: Diagnosis not present

## 2017-08-29 DIAGNOSIS — I10 Essential (primary) hypertension: Secondary | ICD-10-CM

## 2017-08-29 DIAGNOSIS — I5022 Chronic systolic (congestive) heart failure: Secondary | ICD-10-CM | POA: Diagnosis present

## 2017-08-29 DIAGNOSIS — Z79899 Other long term (current) drug therapy: Secondary | ICD-10-CM | POA: Insufficient documentation

## 2017-08-29 DIAGNOSIS — Z8673 Personal history of transient ischemic attack (TIA), and cerebral infarction without residual deficits: Secondary | ICD-10-CM | POA: Diagnosis not present

## 2017-08-29 DIAGNOSIS — J449 Chronic obstructive pulmonary disease, unspecified: Secondary | ICD-10-CM | POA: Diagnosis not present

## 2017-08-29 DIAGNOSIS — I110 Hypertensive heart disease with heart failure: Secondary | ICD-10-CM | POA: Insufficient documentation

## 2017-08-29 DIAGNOSIS — Z7951 Long term (current) use of inhaled steroids: Secondary | ICD-10-CM | POA: Diagnosis not present

## 2017-08-29 DIAGNOSIS — Z8249 Family history of ischemic heart disease and other diseases of the circulatory system: Secondary | ICD-10-CM | POA: Diagnosis not present

## 2017-08-29 DIAGNOSIS — R001 Bradycardia, unspecified: Secondary | ICD-10-CM

## 2017-08-29 DIAGNOSIS — Z7902 Long term (current) use of antithrombotics/antiplatelets: Secondary | ICD-10-CM | POA: Diagnosis not present

## 2017-08-29 DIAGNOSIS — I428 Other cardiomyopathies: Secondary | ICD-10-CM | POA: Insufficient documentation

## 2017-08-29 DIAGNOSIS — Z888 Allergy status to other drugs, medicaments and biological substances status: Secondary | ICD-10-CM | POA: Insufficient documentation

## 2017-08-29 DIAGNOSIS — J410 Simple chronic bronchitis: Secondary | ICD-10-CM

## 2017-08-29 NOTE — Progress Notes (Signed)
Patient ID: Stephen [name], male    DOB: [birth year], 51 y.o.   MRN: [id number]  HPI  Mr. Majano is a 51 y/o male with a history of TIA, HTN and HF with reduced ejection fraction who returns for a follow-up.   Last echo done 05/21/17 showed an EF of 20-25% which is stable from previous echo which was done 06/22/16 with an EF of 20%, mild MR, no aortic stenosis or regurgitation. Last saw his cardiologist in July 2017.  Admitted 07/29/17 due to VT. Initially needed an amiodarone drip and then transitioned to oral amiodarone. Cardiac markers were negative X3. Discharged home after 2 days. Admitted 06/18/17 due to HF exacerbation. Cardiology consult obtained. Discharged home after 2 days. Went to Urgent Care 06/18/17 and was directed to the ED.  Admitted 05/20/17 due to HF exacerbation. Initially needed IV diuretics. Cardiology consult obtained. Discharged the next day. Was at Urgent Care on 03/22/17 due to bronchitis. Treated and released home.   Patient presents today for a follow-up visit with a chief complaint of mild fatigue with moderate exertion. He describes this as chronic in nature having been present for several years with varying levels of severity. He has associated occasional shortness of breath, difficulty sleeping and gradual weight gain. He denies any chest pain, edema, palpitations or dizziness.   Past Medical History:  Diagnosis Date  . CHF (congestive heart failure) (HCC)    nonischemic cardiomyopathy, EF 25%  . Hypertension   . TIA (transient ischemic attack) 06/21/16   Past Surgical History:  Procedure Laterality Date  . KNEE SURGERY Right   . VASECTOMY      Family History  Problem Relation Age of Onset  . Hypertension Mother   . Heart failure Mother   . Hypertension Father   . CAD Father   . Heart attack Father     Social History  Substance Use Topics  . Smoking status: Former Smoker    Packs/day: 0.25    Years: 20.00    Types: Cigarettes    Quit date: 02/20/2016   . Smokeless tobacco: Never Used  . Alcohol use 0.0 oz/week     Comment: beer occasional    Allergies  Allergen Reactions  . Lisinopril Cough   Prior to Admission medications   Medication Sig Start Date End Date Taking? Authorizing Provider  acetaminophen (TYLENOL) 500 MG tablet Take 1 capsule by mouth every 4 (four) hours as needed for fever.   Yes [provider]  albuterol (PROVENTIL HFA;VENTOLIN HFA) 108 (90 Base) MCG/ACT inhaler Inhale 2 puffs into the lungs every 4 (four) hours as needed for wheezing or shortness of breath. 03/22/17  Yes [name], MD  amiodarone (PACERONE) 400 MG tablet Take 1 tablet (400 mg total) by mouth 2 (two) times daily. 07/31/17  Yes [name], MD  atorvastatin (LIPITOR) 40 MG tablet Take 1 tablet (40 mg total) by mouth daily at 6 PM. Patient taking differently: Take 40 mg by mouth daily.  06/22/16  Yes [name], MD  Benzocaine (ANBESOL) 10 % LIQD Use as directed 1 application in the mouth or throat 4 (four) times daily as needed.   Yes [provider]  Camphor-Eucalyptus-Menthol (CHEST RUB) 4.8-1.2-2.6 % OINT Apply 1 application topically as needed.   Yes [provider]  carvedilol (COREG) 6.25 MG tablet Take 6.25 mg by mouth 2 (two) times daily. 06/20/16  Yes [provider]  clopidogrel (PLAVIX) 75 MG tablet Take 1 tablet (75 mg total) by  mouth daily. 06/22/16  Yes [name], MD  cyclobenzaprine (FLEXERIL) 10 MG tablet Take 10 mg by mouth 3 (three) times daily as needed for muscle spasms.   Yes [provider]  diclofenac sodium (VOLTAREN) 1 % GEL Apply 2 g topically 4 (four) times daily.   Yes [provider]  fluticasone (FLONASE) 50 MCG/ACT nasal spray Place 2 sprays into both nostrils daily. 06/20/17  Yes [name], MD  Fluticasone-Salmeterol (ADVAIR DISKUS) 250-50 MCG/DOSE AEPB Inhale 1 puff into the lungs 2 (two) times daily. 06/20/17 06/20/18 Yes [name], MD   furosemide (LASIX) 40 MG tablet Take 1 tablet (40 mg total) by mouth 2 (two) times daily. 05/21/17  Yes [name], MD  Multiple Vitamin (MULTIVITAMIN WITH MINERALS) TABS tablet Take 1 tablet by mouth daily.   Yes [provider]  nitroGLYCERIN (NITROSTAT) 0.4 MG SL tablet Place 1 tablet (0.4 mg total) under the tongue every 5 (five) minutes as needed for chest pain. 07/31/17  Yes [name], MD  pantoprazole (PROTONIX) 40 MG tablet Take 1 tablet (40 mg total) by mouth daily. 06/20/17  Yes [name], MD  potassium chloride 20 MEQ TBCR Take 20 mEq by mouth daily. 05/21/17  Yes [name], MD  sacubitril-valsartan (ENTRESTO) 49-51 MG Take 1 tablet by mouth 2 (two) times daily. Discontinue current valsartan (diovan) 09/25/16  Yes [name] A, FNP  Tiotropium Bromide Monohydrate (SPIRIVA RESPIMAT) 2.5 MCG/ACT AERS Inhale 2.5 Act into the lungs 2 (two) times daily. 07/25/17  Yes [name], MD    Review of Systems  Constitutional: Positive for fatigue. Negative for appetite change.  HENT: Negative for congestion, postnasal drip and sore throat.   Eyes: Negative.   Respiratory: Positive for shortness of breath (yesterday). Negative for cough and chest tightness.   Cardiovascular: Negative for chest pain, palpitations and leg swelling.  Gastrointestinal: Positive for constipation (with amiodarone). Negative for abdominal distention and abdominal pain.  Endocrine: Negative.   Genitourinary: Negative.   Musculoskeletal: Negative for back pain and neck pain.  Skin: Negative.   Allergic/Immunologic: Negative.   Neurological: Negative for dizziness and light-headedness.  Hematological: Negative for adenopathy. Does not bruise/bleed easily.  Psychiatric/Behavioral: Positive for sleep disturbance (wake up feeling tired; sleeping on 1 pillow). Negative for dysphoric mood. The patient is nervous/anxious (at times ).    Vitals:   08/29/17 1633 08/29/17 1656  BP: 97/72 118/73   Pulse: (!) 40 60  Resp: 18   SpO2: 99%   Weight: 239 lb 6 oz (108.6 kg)   Height: 6\' 2"  (1.88 m)    Wt Readings from Last 3 Encounters:  08/29/17 239 lb 6 oz (108.6 kg)  07/29/17 230 lb (104.3 kg)  07/25/17 237 lb (107.5 kg)    Lab Results  Component Value Date   CREATININE 1.65 (H) 07/31/2017   CREATININE 1.69 (H) 07/30/2017   CREATININE 1.24 07/29/2017    Physical Exam  Constitutional: He is oriented to person, place, and time. He appears well-developed and well-nourished.  HENT:  Head: Normocephalic and atraumatic.  Neck: Normal range of motion. Neck supple.  Cardiovascular: Normal rate and regular rhythm.   Pulmonary/Chest: Effort normal. He has no wheezes. He has no rales.  Abdominal: Soft. He exhibits no distension. There is no tenderness.  Musculoskeletal: He exhibits edema (1+ pitting edema after standing at work all day). He exhibits no tenderness.  Neurological: He is alert and oriented to person, place, and time.  Skin: Skin is warm and dry.  Psychiatric: He has a normal mood and affect. His behavior is normal. Thought content normal.  Nursing note and vitals reviewed.    Assessment & Plan:  1: Chronic heart failure with reduced ejection fraction-  - NYHA Class II - euvolemic - Continue daily weights & call for overnight weight gain of >2 pounds or a weekly weight gain of >5 pounds; has gained 9 pounds since he was last here but says that it's been a gradual weight gain - not adding salt but does BBQ on a large cooker and does taste what he's cooking and does use salt when he's cooking. Discussed the importance of closely following a 2000mg  sodium diet; he estimates that he's getting 800-900mg  sodium daily - unable to titrate entresto/carvedilol to due hypotension/bradycardia - saw cardiologist Clayborn Bigness) 08/07/17 and returns in 6 months - having a 2nd opinion with Dr. Fletcher Anon on 10/08/17; going to discuss cardiac catheterization with him as he's never had one  done - does elevate legs which helps reduce edema and then it returns after standing all day at work - going to get some compression socks to wear; instructed to put them on in the morning and remove them at bedtime  2: HTN-  - BP low today but better upon recheck with a manual cuff - BMP done 07/31/17 reviewed and shows sodium 140, potassium 4.2 and GFR 54  3: Bradycardia- - will decrease his carvedilol to 1/2 tablet twice daily (3.125mg )  4: COPD- - using inhalers - PFT's done 08/01/17 - saw pulmonologist (Rawlings) 07/25/17 and returns 10/21/17  Reviewed medication bottles with the patient.  Return here in 1 month or sooner for any questions/problems before then.

## 2017-08-29 NOTE — Patient Instructions (Addendum)
Continue weighing daily and call for an overnight weight gain of > 2 pounds or a weekly weight gain of >5 pounds.  Decrease carvedilol to 1/2 tablet twice daily (3.125mg )

## 2017-08-30 DIAGNOSIS — R001 Bradycardia, unspecified: Secondary | ICD-10-CM | POA: Insufficient documentation

## 2017-09-08 ENCOUNTER — Inpatient Hospital Stay
Admission: EM | Admit: 2017-09-08 | Discharge: 2017-09-10 | DRG: 286 | Disposition: A | Discharge status: Home / Self Care | Payer: Commercial Managed Care - PPO | Attending: Internal Medicine | Admitting: Internal Medicine

## 2017-09-08 ENCOUNTER — Emergency Department: Payer: Commercial Managed Care - PPO

## 2017-09-08 DIAGNOSIS — I214 Non-ST elevation (NSTEMI) myocardial infarction: Secondary | ICD-10-CM

## 2017-09-08 DIAGNOSIS — I447 Left bundle-branch block, unspecified: Secondary | ICD-10-CM | POA: Diagnosis present

## 2017-09-08 DIAGNOSIS — I420 Dilated cardiomyopathy: Secondary | ICD-10-CM | POA: Diagnosis present

## 2017-09-08 DIAGNOSIS — Z8249 Family history of ischemic heart disease and other diseases of the circulatory system: Secondary | ICD-10-CM

## 2017-09-08 DIAGNOSIS — J449 Chronic obstructive pulmonary disease, unspecified: Secondary | ICD-10-CM | POA: Diagnosis present

## 2017-09-08 DIAGNOSIS — Z8673 Personal history of transient ischemic attack (TIA), and cerebral infarction without residual deficits: Secondary | ICD-10-CM | POA: Diagnosis not present

## 2017-09-08 DIAGNOSIS — R0789 Other chest pain: Secondary | ICD-10-CM | POA: Diagnosis present

## 2017-09-08 DIAGNOSIS — Z888 Allergy status to other drugs, medicaments and biological substances status: Secondary | ICD-10-CM

## 2017-09-08 DIAGNOSIS — Z79899 Other long term (current) drug therapy: Secondary | ICD-10-CM

## 2017-09-08 DIAGNOSIS — R079 Chest pain, unspecified: Secondary | ICD-10-CM

## 2017-09-08 DIAGNOSIS — I110 Hypertensive heart disease with heart failure: Secondary | ICD-10-CM | POA: Diagnosis present

## 2017-09-08 DIAGNOSIS — Z87891 Personal history of nicotine dependence: Secondary | ICD-10-CM | POA: Diagnosis not present

## 2017-09-08 DIAGNOSIS — Z9889 Other specified postprocedural states: Secondary | ICD-10-CM

## 2017-09-08 DIAGNOSIS — I5023 Acute on chronic systolic (congestive) heart failure: Secondary | ICD-10-CM | POA: Diagnosis present

## 2017-09-08 DIAGNOSIS — Z7902 Long term (current) use of antithrombotics/antiplatelets: Secondary | ICD-10-CM

## 2017-09-08 DIAGNOSIS — I252 Old myocardial infarction: Secondary | ICD-10-CM | POA: Diagnosis present

## 2017-09-08 HISTORY — DX: Chest pain, unspecified: R07.9

## 2017-09-08 LAB — BASIC METABOLIC PANEL
Anion gap: 8 (ref 5–15)
BUN: 19 mg/dL (ref 6–20)
CHLORIDE: 109 mmol/L (ref 101–111)
CO2: 22 mmol/L (ref 22–32)
Calcium: 9.1 mg/dL (ref 8.9–10.3)
Creatinine, Ser: 0.92 mg/dL (ref 0.61–1.24)
GFR calc Af Amer: >60 mL/min (ref >60)
GFR calc non Af Amer: >60 mL/min (ref >60)
Glucose, Bld: 118 mg/dL — ABNORMAL HIGH (ref 65–99)
POTASSIUM: 3.6 mmol/L (ref 3.5–5.1)
SODIUM: 139 mmol/L (ref 135–145)

## 2017-09-08 LAB — CBC
HEMATOCRIT: 44.6 % (ref 40.0–52.0)
HEMOGLOBIN: 14.9 g/dL (ref 13.0–18.0)
MCH: 29.9 pg (ref 26.0–34.0)
MCHC: 33.4 g/dL (ref 32.0–36.0)
MCV: 89.3 fL (ref 80.0–100.0)
Platelets: 166 10*3/uL (ref 150–440)
RBC: 5 MIL/uL (ref 4.40–5.90)
RDW: 15.5 % — AB (ref 11.5–14.5)
WBC: 7.8 10*3/uL (ref 3.8–10.6)

## 2017-09-08 LAB — TSH: TSH: 1.539 u[IU]/mL (ref 0.350–4.500)

## 2017-09-08 LAB — TROPONIN I: Troponin I: 0.12 ng/mL (ref <0.03)

## 2017-09-08 MED ORDER — FLUTICASONE PROPIONATE 50 MCG/ACT NA SUSP
2.0000 | Freq: Every day | NASAL | Status: DC
  Administered 2017-09-08 – 2017-09-10 (×2): 2 via NASAL
  Filled 2017-09-08: qty 16

## 2017-09-08 MED ORDER — ASPIRIN 81 MG PO CHEW
81.0000 mg | CHEWABLE_TABLET | ORAL | Status: AC
  Administered 2017-09-09: 81 mg via ORAL
  Filled 2017-09-08: qty 1

## 2017-09-08 MED ORDER — FUROSEMIDE 10 MG/ML IJ SOLN
40.0000 mg | Freq: Once | INTRAMUSCULAR | Status: AC
  Administered 2017-09-08: 40 mg via INTRAVENOUS
  Filled 2017-09-08: qty 4

## 2017-09-08 MED ORDER — SODIUM CHLORIDE 0.9 % IV SOLN
INTRAVENOUS | Status: DC
  Administered 2017-09-09: 06:00:00 via INTRAVENOUS

## 2017-09-08 MED ORDER — MORPHINE SULFATE (PF) 2 MG/ML IV SOLN
2.0000 mg | INTRAVENOUS | Status: DC | PRN
  Administered 2017-09-08 – 2017-09-10 (×5): 2 mg via INTRAVENOUS
  Filled 2017-09-08 (×5): qty 1

## 2017-09-08 MED ORDER — POTASSIUM CHLORIDE CRYS ER 20 MEQ PO TBCR
20.0000 meq | EXTENDED_RELEASE_TABLET | Freq: Every day | ORAL | Status: DC
  Administered 2017-09-08 – 2017-09-10 (×3): 20 meq via ORAL
  Filled 2017-09-08 (×3): qty 1

## 2017-09-08 MED ORDER — CARVEDILOL 3.125 MG PO TABS
3.1250 mg | ORAL_TABLET | Freq: Two times a day (BID) | ORAL | Status: DC
  Administered 2017-09-08 – 2017-09-10 (×5): 3.125 mg via ORAL
  Filled 2017-09-08 (×5): qty 1

## 2017-09-08 MED ORDER — SODIUM CHLORIDE 0.9% FLUSH: 3.0000 mL | INTRAVENOUS | Status: DC | PRN

## 2017-09-08 MED ORDER — FUROSEMIDE 10 MG/ML IJ SOLN
40.0000 mg | Freq: Two times a day (BID) | INTRAMUSCULAR | Status: DC
  Administered 2017-09-08 – 2017-09-10 (×4): 40 mg via INTRAVENOUS
  Filled 2017-09-08 (×4): qty 4

## 2017-09-08 MED ORDER — ACETAMINOPHEN 650 MG RE SUPP: 650.0000 mg | Freq: Four times a day (QID) | RECTAL | Status: DC | PRN

## 2017-09-08 MED ORDER — ENOXAPARIN SODIUM 40 MG/0.4ML ~~LOC~~ SOLN
40.0000 mg | SUBCUTANEOUS | Status: DC
  Administered 2017-09-10: 40 mg via SUBCUTANEOUS
  Filled 2017-09-08 (×2): qty 0.4

## 2017-09-08 MED ORDER — PANTOPRAZOLE SODIUM 40 MG PO TBEC
40.0000 mg | DELAYED_RELEASE_TABLET | Freq: Every day | ORAL | Status: DC
  Administered 2017-09-08 – 2017-09-10 (×3): 40 mg via ORAL
  Filled 2017-09-08 (×3): qty 1

## 2017-09-08 MED ORDER — ENOXAPARIN SODIUM 100 MG/ML ~~LOC~~ SOLN
SUBCUTANEOUS | Status: AC
  Filled 2017-09-08: qty 1

## 2017-09-08 MED ORDER — ADULT MULTIVITAMIN W/MINERALS CH
1.0000 | ORAL_TABLET | Freq: Every day | ORAL | Status: DC
  Administered 2017-09-08 – 2017-09-10 (×2): 1 via ORAL
  Filled 2017-09-08 (×3): qty 1

## 2017-09-08 MED ORDER — ONDANSETRON HCL 4 MG/2ML IJ SOLN
4.0000 mg | Freq: Once | INTRAMUSCULAR | Status: AC
  Administered 2017-09-08: 4 mg via INTRAVENOUS
  Filled 2017-09-08: qty 2

## 2017-09-08 MED ORDER — CYCLOBENZAPRINE HCL 10 MG PO TABS
10.0000 mg | ORAL_TABLET | Freq: Three times a day (TID) | ORAL | Status: DC | PRN
  Administered 2017-09-08 – 2017-09-09 (×2): 10 mg via ORAL
  Filled 2017-09-08 (×2): qty 1

## 2017-09-08 MED ORDER — ENOXAPARIN SODIUM 40 MG/0.4ML ~~LOC~~ SOLN
SUBCUTANEOUS | Status: AC
  Filled 2017-09-08: qty 0.4

## 2017-09-08 MED ORDER — MOMETASONE FURO-FORMOTEROL FUM 200-5 MCG/ACT IN AERO
2.0000 | INHALATION_SPRAY | Freq: Two times a day (BID) | RESPIRATORY_TRACT | Status: DC
  Administered 2017-09-08 – 2017-09-10 (×6): 2 via RESPIRATORY_TRACT
  Filled 2017-09-08: qty 8.8

## 2017-09-08 MED ORDER — ASPIRIN 81 MG PO CHEW
324.0000 mg | CHEWABLE_TABLET | Freq: Once | ORAL | Status: AC
Start: 2017-09-08 — End: 2017-09-08
  Administered 2017-09-08: 324 mg via ORAL
  Filled 2017-09-08: qty 4

## 2017-09-08 MED ORDER — ENOXAPARIN SODIUM 120 MG/0.8ML ~~LOC~~ SOLN
1.0000 mg/kg | Freq: Once | SUBCUTANEOUS | Status: AC
  Administered 2017-09-08: 110 mg via SUBCUTANEOUS
  Filled 2017-09-08: qty 0.8

## 2017-09-08 MED ORDER — NITROGLYCERIN 0.4 MG SL SUBL: 0.4000 mg | SUBLINGUAL_TABLET | SUBLINGUAL | Status: DC | PRN

## 2017-09-08 MED ORDER — ASPIRIN EC 81 MG PO TBEC
81.0000 mg | DELAYED_RELEASE_TABLET | Freq: Every day | ORAL | Status: DC
  Administered 2017-09-08 – 2017-09-10 (×3): 81 mg via ORAL
  Filled 2017-09-08 (×3): qty 1

## 2017-09-08 MED ORDER — ONDANSETRON HCL 4 MG PO TABS: 4.0000 mg | ORAL_TABLET | Freq: Four times a day (QID) | ORAL | Status: DC | PRN

## 2017-09-08 MED ORDER — AMIODARONE HCL 200 MG PO TABS
400.0000 mg | ORAL_TABLET | Freq: Two times a day (BID) | ORAL | Status: DC
  Administered 2017-09-08 – 2017-09-10 (×5): 400 mg via ORAL
  Filled 2017-09-08 (×5): qty 2

## 2017-09-08 MED ORDER — MORPHINE SULFATE (PF) 2 MG/ML IV SOLN
2.0000 mg | Freq: Once | INTRAVENOUS | Status: AC
  Administered 2017-09-08: 2 mg via INTRAVENOUS
  Filled 2017-09-08: qty 1

## 2017-09-08 MED ORDER — CLOPIDOGREL BISULFATE 75 MG PO TABS
75.0000 mg | ORAL_TABLET | Freq: Every day | ORAL | Status: DC
  Administered 2017-09-08 – 2017-09-10 (×3): 75 mg via ORAL
  Filled 2017-09-08 (×3): qty 1

## 2017-09-08 MED ORDER — ACETAMINOPHEN 325 MG PO TABS
650.0000 mg | ORAL_TABLET | Freq: Four times a day (QID) | ORAL | Status: DC | PRN
  Administered 2017-09-08: 650 mg via ORAL
  Filled 2017-09-08: qty 2

## 2017-09-08 MED ORDER — SACUBITRIL-VALSARTAN 49-51 MG PO TABS
1.0000 | ORAL_TABLET | Freq: Two times a day (BID) | ORAL | Status: DC
  Administered 2017-09-08 – 2017-09-10 (×5): 1 via ORAL
  Filled 2017-09-08 (×6): qty 1

## 2017-09-08 MED ORDER — ALBUTEROL SULFATE (2.5 MG/3ML) 0.083% IN NEBU: 2.5000 mg | INHALATION_SOLUTION | RESPIRATORY_TRACT | Status: DC | PRN

## 2017-09-08 MED ORDER — SODIUM CHLORIDE 0.9% FLUSH
3.0000 mL | Freq: Two times a day (BID) | INTRAVENOUS | Status: DC
  Administered 2017-09-08: 3 mL via INTRAVENOUS

## 2017-09-08 MED ORDER — TIOTROPIUM BROMIDE MONOHYDRATE 18 MCG IN CAPS
1.0000 | ORAL_CAPSULE | Freq: Every day | RESPIRATORY_TRACT | Status: DC
  Administered 2017-09-08 – 2017-09-10 (×3): 18 ug via RESPIRATORY_TRACT
  Filled 2017-09-08: qty 5

## 2017-09-08 MED ORDER — SODIUM CHLORIDE 0.9 % IV SOLN
250.0000 mL | INTRAVENOUS | Status: DC | PRN
Start: 2017-09-08 — End: 2017-09-09

## 2017-09-08 MED ORDER — ONDANSETRON HCL 4 MG/2ML IJ SOLN: 4.0000 mg | Freq: Four times a day (QID) | INTRAMUSCULAR | Status: DC | PRN

## 2017-09-08 MED ORDER — FUROSEMIDE 40 MG PO TABS: 40.0000 mg | ORAL_TABLET | Freq: Two times a day (BID) | ORAL | Status: DC

## 2017-09-08 MED ORDER — ATORVASTATIN CALCIUM 20 MG PO TABS
40.0000 mg | ORAL_TABLET | Freq: Every day | ORAL | Status: DC
  Administered 2017-09-08 – 2017-09-09 (×2): 40 mg via ORAL
  Filled 2017-09-08 (×2): qty 2

## 2017-09-08 MED ORDER — DOCUSATE SODIUM 100 MG PO CAPS
100.0000 mg | ORAL_CAPSULE | Freq: Two times a day (BID) | ORAL | Status: DC
  Administered 2017-09-08 – 2017-09-10 (×5): 100 mg via ORAL
  Filled 2017-09-08 (×5): qty 1

## 2017-09-08 NOTE — ED Provider Notes (Signed)
[facility] Emergency Department Provider Note    First MD Initiated Contact with Patient 09/08/17 [phone]     (approximate)  I have reviewed the triage vital signs and the nursing notes.   HISTORY  Chief Complaint Shortness of Breath and Back Pain    HPI Stephen [name] is a 51 y.o. male with below list of chronic medical conditions including hypertension and CHF presents to the emergency department 1 day history of central chest discomfort radiating to the acute accompanied by dyspnea. Patient denies any dizziness no diaphoresis. Patient denies any orthopnea. Patient denies any cough or fever   Past Medical History:  Diagnosis Date  . CHF (congestive heart failure) (HCC)    nonischemic cardiomyopathy, EF 25%  . Hypertension   . TIA (transient ischemic attack) 06/21/16    Patient Active Problem List   Diagnosis Date Noted  . Bradycardia 08/30/2017  . Ventricular tachycardia ([place]) 07/29/2017  . COPD (chronic obstructive pulmonary disease) ([place]) 07/04/2017  . Chronic systolic CHF (congestive heart failure) ([place]) 05/20/2017  . TIA (transient ischemic attack) 06/22/2016  . HTN (hypertension) 06/22/2016  . Acute on chronic systolic CHF (congestive heart failure) ([place]) 06/04/2016    Past Surgical History:  Procedure Laterality Date  . KNEE SURGERY Right   . VASECTOMY      Prior to Admission medications   Medication Sig Start Date End Date Taking? Authorizing Provider  acetaminophen (TYLENOL) 500 MG tablet Take 1 capsule by mouth every 4 (four) hours as needed for fever.   Yes [provider]  albuterol (PROVENTIL HFA;VENTOLIN HFA) 108 (90 Base) MCG/ACT inhaler Inhale 2 puffs into the lungs every 4 (four) hours as needed for wheezing or shortness of breath. 03/22/17  Yes [name], MD  amiodarone (PACERONE) 400 MG tablet Take 1 tablet (400 mg total) by mouth 2 (two) times daily. 07/31/17  Yes [name], MD  atorvastatin (LIPITOR) 40 MG  tablet Take 1 tablet (40 mg total) by mouth daily at 6 PM. Patient taking differently: Take 40 mg by mouth daily.  06/22/16  Yes [name], MD  Benzocaine (ANBESOL) 10 % LIQD Use as directed 1 application in the mouth or throat 4 (four) times daily as needed.   Yes [provider]  Camphor-Eucalyptus-Menthol (CHEST RUB) 4.8-1.2-2.6 % OINT Apply 1 application topically as needed.   Yes [provider]  carvedilol (COREG) 6.25 MG tablet Take 3.125 mg by mouth 2 (two) times daily.  06/20/16  Yes [provider]  clopidogrel (PLAVIX) 75 MG tablet Take 1 tablet (75 mg total) by mouth daily. 06/22/16  Yes [name], MD  cyclobenzaprine (FLEXERIL) 10 MG tablet Take 10 mg by mouth 3 (three) times daily as needed for muscle spasms.   Yes [provider]  diclofenac sodium (VOLTAREN) 1 % GEL Apply 2 g topically 4 (four) times daily.   Yes [provider]  fluticasone (FLONASE) 50 MCG/ACT nasal spray Place 2 sprays into both nostrils daily. 06/20/17  Yes [name], MD  Fluticasone-Salmeterol (ADVAIR DISKUS) 250-50 MCG/DOSE AEPB Inhale 1 puff into the lungs 2 (two) times daily. 06/20/17 06/20/18 Yes [name], MD  furosemide (LASIX) 40 MG tablet Take 1 tablet (40 mg total) by mouth 2 (two) times daily. 05/21/17  Yes [name], MD  Multiple Vitamin (MULTIVITAMIN WITH MINERALS) TABS tablet Take 1 tablet by mouth daily.   Yes [provider]  nitroGLYCERIN (NITROSTAT) 0.4 MG SL tablet Place 1 tablet (0.4 mg total) under  the tongue every 5 (five) minutes as needed for chest pain. 07/31/17  Yes [name], MD  pantoprazole (PROTONIX) 40 MG tablet Take 1 tablet (40 mg total) by mouth daily. 06/20/17  Yes [name], MD  potassium chloride 20 MEQ TBCR Take 20 mEq by mouth daily. 05/21/17  Yes [name], MD  sacubitril-valsartan (ENTRESTO) 49-51 MG Take 1 tablet by mouth 2 (two) times daily. Discontinue current valsartan  (diovan) 09/25/16  Yes [name] A, FNP  Tiotropium Bromide Monohydrate (SPIRIVA RESPIMAT) 2.5 MCG/ACT AERS Inhale 2.5 Act into the lungs 2 (two) times daily. 07/25/17  Yes [name], MD    Allergies Lisinopril  Family History  Problem Relation Age of Onset  . Hypertension Mother   . Heart failure Mother   . Hypertension Father   . CAD Father   . Heart attack Father     Social History Social History  Substance Use Topics  . Smoking status: Former Smoker    Packs/day: 0.25    Years: 20.00    Types: Cigarettes    Quit date: 02/20/2016  . Smokeless tobacco: Never Used  . Alcohol use 0.0 oz/week     Comment: beer occasional    Review of Systems Constitutional: No fever/chills Eyes: No visual changes. ENT: No sore throat. Cardiovascular: Positive for chest pain. Respiratory: Positive for ss of breath. Gastrointestinal: No abdominal pain.  No nausea, no vomiting.  No diarrhea.  No constipation. Genitourinary: Negative for dysuria. Musculoskeletal: Negative for neck pain.  Negative for back pain. Integumentary: Negative for rash. Neurological: Negative for headaches, focal weakness or numbness.   ____________________________________________   PHYSICAL EXAM:  VITAL SIGNS: ED Triage Vitals  Enc Vitals Group     BP 09/08/17 0340 (!) 164/105     Pulse Rate 09/08/17 0340 91     Resp 09/08/17 0340 19     Temp 09/08/17 0340 98 F (36.7 C)     Temp Source 09/08/17 0340 Oral     SpO2 09/08/17 0340 97 %     Weight 09/08/17 0350 108.4 kg (239 lb)     Height 09/08/17 0350 1.88 m (6\' 2" )     Head Circumference --      Peak Flow --      Pain Score 09/08/17 0350 5     Pain Loc --      Pain Edu? --      Excl. in [place]? --    Constitutional: Alert and oriented. Well appearing and in no acute distress. Eyes: Conjunctivae are normal. Head: Atraumatic. Mouth/Throat: Mucous membranes are moist.  Oropharynx non-erythematous. Neck: No stridor.   Cardiovascular: Normal rate,  regular rhythm. Good peripheral circulation. Grossly normal heart sounds. Respiratory: Normal respiratory effort.  No retractions. Lungs CTAB. Gastrointestinal: Soft and nontender. No distention.  Musculoskeletal: No lower extremity tenderness nor edema. No gross deformities of extremities. Neurologic:  Normal speech and language. No gross focal neurologic deficits are appreciated.  Skin:  Skin is warm, dry and intact. No rash noted. Psychiatric: Mood and affect are normal. Speech and behavior are normal.  ____________________________________________   LABS (all labs ordered are listed, but only abnormal results are displayed)  Labs Reviewed  BASIC METABOLIC PANEL - Abnormal; Notable for the following:       Result Value   Glucose, Bld 118 (*)    All other components within normal limits  CBC - Abnormal; Notable for the following:    RDW 15.5 (*)    All other components within  normal limits  TROPONIN I - Abnormal; Notable for the following:    Troponin I 0.12 (*)    All other components within normal limits   ____________________________________________  EKG  ED ECG REPORT I, Wright N Quiara Killian, the attending physician, personally viewed and interpreted this ECG.   Date: 09/08/2017  EKG Time: 3:40 AM  Rate: 91  Rhythm: Normal sinus rhythm with premature ventricular contraction}  Axis: Left axis deviation  Intervals: Normal  ST&T Change: None  ____________________________________________  RADIOLOGY I, Pisinemo N Casimira Sutphin, personally viewed and evaluated these images (plain radiographs) as part of my medical decision making, as well as reviewing the written report by the radiologist.  Dg Chest 2 View  Result Date: 09/08/2017 CLINICAL DATA:  Shortness of breath and right upper back pain radiating to the right chest. History of CHF. EXAM: CHEST  2 VIEW COMPARISON:  07/29/2017 FINDINGS: Cardiac enlargement with mild pulmonary vascular congestion. Interstitial infiltrates in the  lung bases. Mild perihilar infiltration. Changes likely representing edema. No blunting of costophrenic angles. No pneumothorax. Tortuous aorta. Similar appearance to previous study. IMPRESSION: Cardiac enlargement with pulmonary vascular congestion and interstitial and perihilar edema. Similar appearance to previous study. Electronically Signed   By: Lucienne Capers M.D.   On: 09/08/2017 04:14    ____________________________________________   PROCEDURES  Critical Care performed: CRITICAL CARE Performed by: Gregor Hams   Total critical care time: 30 minutes  Critical care time was exclusive of separately billable procedures and treating other patients.  Critical care was necessary to treat or prevent imminent or life-threatening deterioration.  Critical care was time spent personally by me on the following activities: development of treatment plan with patient and/or surrogate as well as nursing, discussions with consultants, evaluation of patient's response to treatment, examination of patient, obtaining history from patient or surrogate, ordering and performing treatments and interventions, ordering and review of laboratory studies, ordering and review of radiographic studies, pulse oximetry and re-evaluation of patient's condition.   Procedures   ____________________________________________   INITIAL IMPRESSION / ASSESSMENT AND PLAN / ED COURSE  Pertinent labs & imaging results that were available during my care of the patient were reviewed by me and considered in my medical decision making (see chart for details).  51 year old male presenting with above stated history of physical exam concern for possible cardiac etiology NSTEMI given history and elevated troponin of 0.12. Patient given aspirin 324 mg Lovenox 1 mg/kg. Patient discussed with Dr. Marcille Blanco for hospital admission for further evaluation and management      ____________________________________________  FINAL  CLINICAL IMPRESSION(S) / ED DIAGNOSES  Final diagnoses:  NSTEMI (non-ST elevated myocardial infarction) [facility])     MEDICATIONS GIVEN DURING THIS VISIT:  Medications  enoxaparin (LOVENOX) 100 MG/ML injection (  Not Given 09/08/17 0520)  enoxaparin (LOVENOX) 40 MG/0.4ML injection (  Not Given 09/08/17 0519)  aspirin chewable tablet 324 mg (324 mg Oral Given 09/08/17 0517)  enoxaparin (LOVENOX) injection 110 mg (110 mg Subcutaneous Given 09/08/17 0541)  morphine 2 MG/ML injection 2 mg (2 mg Intravenous Given 09/08/17 0535)  ondansetron (ZOFRAN) injection 4 mg (4 mg Intravenous Given 09/08/17 0533)     NEW OUTPATIENT MEDICATIONS STARTED DURING THIS VISIT:  New Prescriptions   No medications on file    Modified Medications   No medications on file    Discontinued Medications   No medications on file     Note:  This document was prepared using Dragon voice recognition software and may include unintentional  dictation errors.    Gregor Hams, MD 09/08/17 [phone]

## 2017-09-08 NOTE — Progress Notes (Signed)
Patient is complaining of back/friction rub pain of 7/10 on a pain scale. Patient stated the Flexeril medication ordered doesn't work when he takes it. Dr. Jannifer Franklin notified with a new order for Morphine 2 mg IV every 4 hours as needed for sever pain. Will continue to monitor.

## 2017-09-08 NOTE — ED Notes (Signed)
Pt transport to 240

## 2017-09-08 NOTE — Consult Note (Signed)
Stephen [name]  CARDIOLOGY CONSULT NOTE  Patient ID: Stephen [name] MRN: [id number] DOB/AGE: 1966-11-27 51 y.o.  Admit date: 09/08/2017 Referring Physician Dr. Margaretmary Eddy Primary Physician   Primary Cardiologist [facility] Reason for Consultation chest pain  HPI: Pt is a 51 yo male with history of dilated cardiomyopathy with known ef of 20-25% treated medically due to deferring consideration for cardiac cath or aicd placement. He has been treated with entresto, carvedilol, diuretics, asa and plavix. He was admitted after presenting to the er with complaints of chest pain. He has a mild troponin eleation. EKG shows lbbb.   Review of Systems  Constitutional: Negative.   HENT: Negative.   Eyes: Negative.   Respiratory: Negative.   Cardiovascular: Positive for chest pain.  Gastrointestinal: Negative.   Genitourinary: Negative.   Musculoskeletal: Negative.   Skin: Negative.   Neurological: Negative.   Endo/Heme/Allergies: Negative.   Psychiatric/Behavioral: Negative.     Past Medical History:  Diagnosis Date  . CHF (congestive heart failure) (HCC)    nonischemic cardiomyopathy, EF 25%  . Hypertension   . TIA (transient ischemic attack) 06/21/16    Family History  Problem Relation Age of Onset  . Hypertension Mother   . Heart failure Mother   . Hypertension Father   . CAD Father   . Heart attack Father     Social History   Social History  . Marital status: Single    Spouse name: N/A  . Number of children: N/A  . Years of education: N/A   Occupational History  . Not on file.   Social History Main Topics  . Smoking status: Former Smoker    Packs/day: 0.25    Years: 20.00    Types: Cigarettes    Quit date: 02/20/2016  . Smokeless tobacco: Never Used  . Alcohol use 0.0 oz/week     Comment: beer occasional  . Drug use: No  . Sexual activity: Not on file   Other Topics Concern  . Not on file   Social History Narrative     Independent at baseline    Past Surgical History:  Procedure Laterality Date  . KNEE SURGERY Right   . VASECTOMY       Prescriptions Prior to Admission  Medication Sig Dispense Refill Last Dose  . acetaminophen (TYLENOL) 500 MG tablet Take 1 capsule by mouth every 4 (four) hours as needed for fever.   Taking  . albuterol (PROVENTIL HFA;VENTOLIN HFA) 108 (90 Base) MCG/ACT inhaler Inhale 2 puffs into the lungs every 4 (four) hours as needed for wheezing or shortness of breath. 1 Inhaler 0 Taking  . amiodarone (PACERONE) 400 MG tablet Take 1 tablet (400 mg total) by mouth 2 (two) times daily. 30 tablet 0 09/07/2017 at Unknown time  . atorvastatin (LIPITOR) 40 MG tablet Take 1 tablet (40 mg total) by mouth daily at 6 PM. (Patient taking differently: Take 40 mg by mouth daily. ) 30 tablet 2 09/07/2017 at Unknown time  . Benzocaine (ANBESOL) 10 % LIQD Use as directed 1 application in the mouth or throat 4 (four) times daily as needed.   Taking  . Camphor-Eucalyptus-Menthol (CHEST RUB) 4.8-1.2-2.6 % OINT Apply 1 application topically as needed.   09/07/2017 at Unknown time  . carvedilol (COREG) 6.25 MG tablet Take 3.125 mg by mouth 2 (two) times daily.    09/07/2017 at Unknown time  . clopidogrel (PLAVIX) 75 MG tablet Take 1 tablet (75 mg total) by  mouth daily. 30 tablet 2 09/07/2017 at Unknown time  . cyclobenzaprine (FLEXERIL) 10 MG tablet Take 10 mg by mouth 3 (three) times daily as needed for muscle spasms.   09/07/2017 at Unknown time  . diclofenac sodium (VOLTAREN) 1 % GEL Apply 2 g topically 4 (four) times daily.   09/07/2017 at Unknown time  . fluticasone (FLONASE) 50 MCG/ACT nasal spray Place 2 sprays into both nostrils daily. 16 g 2 Taking  . Fluticasone-Salmeterol (ADVAIR DISKUS) 250-50 MCG/DOSE AEPB Inhale 1 puff into the lungs 2 (two) times daily. 1 each 0 09/07/2017 at Unknown time  . furosemide (LASIX) 40 MG tablet Take 1 tablet (40 mg total) by mouth 2 (two) times daily. 60 tablet 2 09/07/2017 at  Unknown time  . Multiple Vitamin (MULTIVITAMIN WITH MINERALS) TABS tablet Take 1 tablet by mouth daily.   09/07/2017 at Unknown time  . nitroGLYCERIN (NITROSTAT) 0.4 MG SL tablet Place 1 tablet (0.4 mg total) under the tongue every 5 (five) minutes as needed for chest pain. 30 tablet 0 Taking  . pantoprazole (PROTONIX) 40 MG tablet Take 1 tablet (40 mg total) by mouth daily. 30 tablet 0 09/07/2017 at Unknown time  . potassium chloride 20 MEQ TBCR Take 20 mEq by mouth daily. 30 tablet 2 09/07/2017 at Unknown time  . sacubitril-valsartan (ENTRESTO) 49-51 MG Take 1 tablet by mouth 2 (two) times daily. Discontinue current valsartan (diovan) 60 tablet 5 09/07/2017 at Unknown time  . Tiotropium Bromide Monohydrate (SPIRIVA RESPIMAT) 2.5 MCG/ACT AERS Inhale 2.5 Act into the lungs 2 (two) times daily. 1 Inhaler 12 09/07/2017 at Unknown time    Physical Exam: Blood pressure (!) 116/93, pulse 75, temperature (!) 97.3 F (36.3 C), temperature source Oral, resp. rate 18, height 6\' 2"  (1.88 m), weight 108.4 kg (239 lb), SpO2 100 %.   Wt Readings from Last 1 Encounters:  09/08/17 108.4 kg (239 lb)     General appearance: alert and cooperative Resp: clear to auscultation bilaterally Cardio: regular rate and rhythm GI: soft, non-tender; bowel sounds normal; no masses,  no organomegaly Extremities: extremities normal, atraumatic, no cyanosis or edema Neurologic: Grossly normal  Labs:   Lab Results  Component Value Date   WBC 7.8 09/08/2017   HGB 14.9 09/08/2017   HCT 44.6 09/08/2017   MCV 89.3 09/08/2017   PLT 166 09/08/2017    Recent Labs Lab 09/08/17 0351  NA 139  K 3.6  CL 109  CO2 22  BUN 19  CREATININE 0.92  CALCIUM 9.1  GLUCOSE 118*   Lab Results  Component Value Date   TROPONINI 0.12 ([place]) 09/08/2017      Radiology: mild pulmonary congestion. EKG: nsr with incomplete bbb ASSESSMENT AND PLAN:  Pt iwht history of cardiomyopathy who was admitted with chest pain and has mild troponin  elevaiton. May be secondary to dmeand ischemia due to chf and cardiomyopathy with ef of 20-25%. Will need cardiac cath to evaluate anatomy to deterine if revascularization is indicated. . Will need consideraiton for aicd after cath. Continue with outpatient meds. For now.  Signed: Teodoro Spray MD, [facility] 09/08/2017, 5:06 PM

## 2017-09-08 NOTE — ED Triage Notes (Signed)
Patient c/o SOB and right upper back pain that radiates to right chest.  Patient has hx of CHF.

## 2017-09-08 NOTE — Progress Notes (Signed)
Pt arrived to floor via stretcher from ED. Pt A&O with no complaints of pain at this time. Telemetry monitor applied and calledd to CCMD. Oriented pt to  Room, call bell system

## 2017-09-08 NOTE — Progress Notes (Signed)
Abnormal EKG observed (Antereior infarct). Dr. Jannifer Franklin notified without any new order. Patient is resting quietly at this time after the Morphine medication for back pain, respirations even and unlabored. Left and right groin prepped for cath procedure. 22 g IV inserted on right forearm. Patient tolerated well. Will continue to monitor.

## 2017-09-08 NOTE — ED Notes (Signed)
Margarita Grizzle RN, notified bed assigned

## 2017-09-08 NOTE — ED Notes (Signed)
Pt noted to be resting with eyes closed and resp even, unlabored; sats dropped to 84% while sleeping; oxygen at 2L via [place] placed; sats up to 97%

## 2017-09-08 NOTE — Progress Notes (Signed)
[place] [facility] [place] NAME: Stephen [name]    MR#:  [id number]  DATE OF BIRTH:  October 10, 1966  SUBJECTIVE:  CHIEF COMPLAINT:  Patient is very tired as he couldn't sleep last night being in ED. Denies any chest pain but reporting shortness of breath with exertion  REVIEW OF SYSTEMS:  CONSTITUTIONAL: No fever, fatigue or weakness.  EYES: No blurred or double vision.  EARS, NOSE, AND THROAT: No tinnitus or ear pain.  RESPIRATORY: No cough, reporting exertionalshortness of breath, denies wheezing or hemoptysis.  CARDIOVASCULAR: No chest pain, orthopnea, edema.  GASTROINTESTINAL: No nausea, vomiting, diarrhea or abdominal pain.  GENITOURINARY: No dysuria, hematuria.  ENDOCRINE: No polyuria, nocturia,  HEMATOLOGY: No anemia, easy bruising or bleeding SKIN: No rash or lesion. MUSCULOSKELETAL: No joint pain or arthritis.   NEUROLOGIC: No tingling, numbness, weakness.  PSYCHIATRY: No anxiety or depression.   DRUG ALLERGIES:   Allergies  Allergen Reactions  . Lisinopril Cough    VITALS:  Blood pressure (!) 116/93, pulse 75, temperature (!) 97.3 F (36.3 C), temperature source Oral, resp. rate 18, height 6\' 2"  (1.88 m), weight 108.4 kg (239 lb), SpO2 100 %.  PHYSICAL EXAMINATION:  GENERAL:  51 y.o.-year-old patient lying in the bed with no acute distress.  EYES: Pupils equal, round, reactive to light and accommodation. No scleral icterus. Extraocular muscles intact.  HEENT: Head atraumatic, normocephalic. Oropharynx and nasopharynx clear.  NECK:  Supple, no jugular venous distention. No thyroid enlargement, no tenderness.  LUNGS: moderate breath sounds bilaterally, positive rales and rhonchi bilaterally;no wheezing, crepitation. No use of accessory muscles of respiration.  CARDIOVASCULAR: S1, S2 normal. No murmurs, rubs, or gallops.  ABDOMEN: Soft, nontender, nondistended. Bowel sounds present. No organomegaly or mass.  EXTREMITIES: No  pedal edema, cyanosis, or clubbing.  NEUROLOGIC: Cranial nerves II through XII are intact. Muscle strength 5/5 in all extremities. Sensation intact. Gait not checked.  PSYCHIATRIC: The patient is alert and oriented x 3.  SKIN: No obvious rash, lesion, or ulcer.    LABORATORY PANEL:   CBC  Recent Labs Lab 09/08/17 0351  WBC 7.8  HGB 14.9  HCT 44.6  PLT 166   ------------------------------------------------------------------------------------------------------------------  Chemistries   Recent Labs Lab 09/08/17 0351  NA 139  K 3.6  CL 109  CO2 22  GLUCOSE 118*  BUN 19  CREATININE 0.92  CALCIUM 9.1   ------------------------------------------------------------------------------------------------------------------  Cardiac Enzymes  Recent Labs Lab 09/08/17 0351  TROPONINI 0.12*   ------------------------------------------------------------------------------------------------------------------  RADIOLOGY:  Dg Chest 2 View  Result Date: 09/08/2017 CLINICAL DATA:  Shortness of breath and right upper back pain radiating to the right chest. History of CHF. EXAM: CHEST  2 VIEW COMPARISON:  07/29/2017 FINDINGS: Cardiac enlargement with mild pulmonary vascular congestion. Interstitial infiltrates in the lung bases. Mild perihilar infiltration. Changes likely representing edema. No blunting of costophrenic angles. No pneumothorax. Tortuous aorta. Similar appearance to previous study. IMPRESSION: Cardiac enlargement with pulmonary vascular congestion and interstitial and perihilar edema. Similar appearance to previous study. Electronically Signed   By: Lucienne Capers M.D.   On: 09/08/2017 04:14    EKG:   Orders placed or performed during the hospital encounter of 09/08/17  . ED EKG within 10 minutes  . ED EKG within 10 minutes    ASSESSMENT AND PLAN:   Patient with history of CHF and hypertension is presenting with chest pain with shortness of breath  #chest pain-from  unstable angina Monitor patient on telemetry, troponin 0.12 Nothing  by mouth after midnight for cardiac catheterization tomorrow Follow-up with [facility] cardiology Continue aspirin, Plavix, Coreg, statin Sublingual nitrogen as needed basis  #acute on chronic systolic CHF exacerbation Lasix 40 mg po  every 12 hours Continue aspirin, Plavix, Coreg and statin Monitor intake and output and daily weights Follow-up with [facility] cardiology Normal TSH  #. Hypertension: diastolic hypertension; continue carvedilol.   #COPD:no exacerbation Continue Spiriva and inhaled corticosteroid.   # DVT prophylaxis:  Lovenox 6. GI prophylaxis: Pantoprazole     All the records are reviewed and case discussed with Care Management/Social Workerr. Management plans discussed with the patient, family and they are in agreement.  CODE STATUS: fc   TOTAL TIME TAKING CARE OF THIS PATIENT: 36  minutes.   POSSIBLE D/C IN 1-2  DAYS, DEPENDING ON CLINICAL CONDITION.  Note: This dictation was prepared with Dragon dictation along with smaller phrase technology. Any transcriptional errors that result from this process are unintentional.   [name] M.D on 09/08/2017 at 1:49 PM  Between 7am to 6pm - Pager - [phone] After 6pm go to [email or web address] - password [email or web address] [facility]  [facility]  Office  [phone]  CC: Primary care physician; [name], MD

## 2017-09-08 NOTE — H&P (Signed)
Stephen [name] is an 51 y.o. male.   Chief Complaint: chest pain HPI: the patient with past medical history of systolic congestive heart failure, hypertension and previous TIA presents to the emergency department complaining of chest pain. His pain began at rest and associated with mild shortness of breath. The patient took nitroglycerin which relieved his chest pain some but gave him a severe headache.In the emergency department the patient's pain waxed and waned. It is still intermittent and nonradiating. The patient denies nausea, vomiting or diaphoresis. Troponin found to be elevated. Due to known heart disease as well as elevated biomarkers and ongoing pain in the emergency department called the hospitalist service for further evaluation.  Past Medical History:  Diagnosis Date  . CHF (congestive heart failure) (HCC)    nonischemic cardiomyopathy, EF 25%  . Hypertension   . TIA (transient ischemic attack) 06/21/16    Past Surgical History:  Procedure Laterality Date  . KNEE SURGERY Right   . VASECTOMY      Family History  Problem Relation Age of Onset  . Hypertension Mother   . Heart failure Mother   . Hypertension Father   . CAD Father   . Heart attack Father    Social History:  reports that he quit smoking about 18 months ago. His smoking use included Cigarettes. He has a 5.00 pack-year smoking history. He has never used smokeless tobacco. He reports that he drinks alcohol. He reports that he does not use drugs.  Allergies:  Allergies  Allergen Reactions  . Lisinopril Cough    Medications Prior to Admission  Medication Sig Dispense Refill  . acetaminophen (TYLENOL) 500 MG tablet Take 1 capsule by mouth every 4 (four) hours as needed for fever.    Marland Kitchen albuterol (PROVENTIL HFA;VENTOLIN HFA) 108 (90 Base) MCG/ACT inhaler Inhale 2 puffs into the lungs every 4 (four) hours as needed for wheezing or shortness of breath. 1 Inhaler 0  . amiodarone (PACERONE) 400 MG tablet Take 1  tablet (400 mg total) by mouth 2 (two) times daily. 30 tablet 0  . atorvastatin (LIPITOR) 40 MG tablet Take 1 tablet (40 mg total) by mouth daily at 6 PM. (Patient taking differently: Take 40 mg by mouth daily. ) 30 tablet 2  . Benzocaine (ANBESOL) 10 % LIQD Use as directed 1 application in the mouth or throat 4 (four) times daily as needed.    . Camphor-Eucalyptus-Menthol (CHEST RUB) 4.8-1.2-2.6 % OINT Apply 1 application topically as needed.    . carvedilol (COREG) 6.25 MG tablet Take 3.125 mg by mouth 2 (two) times daily.     . clopidogrel (PLAVIX) 75 MG tablet Take 1 tablet (75 mg total) by mouth daily. 30 tablet 2  . cyclobenzaprine (FLEXERIL) 10 MG tablet Take 10 mg by mouth 3 (three) times daily as needed for muscle spasms.    . diclofenac sodium (VOLTAREN) 1 % GEL Apply 2 g topically 4 (four) times daily.    . fluticasone (FLONASE) 50 MCG/ACT nasal spray Place 2 sprays into both nostrils daily. 16 g 2  . Fluticasone-Salmeterol (ADVAIR DISKUS) 250-50 MCG/DOSE AEPB Inhale 1 puff into the lungs 2 (two) times daily. 1 each 0  . furosemide (LASIX) 40 MG tablet Take 1 tablet (40 mg total) by mouth 2 (two) times daily. 60 tablet 2  . Multiple Vitamin (MULTIVITAMIN WITH MINERALS) TABS tablet Take 1 tablet by mouth daily.    . nitroGLYCERIN (NITROSTAT) 0.4 MG SL tablet Place 1 tablet (0.4 mg total) under  the tongue every 5 (five) minutes as needed for chest pain. 30 tablet 0  . pantoprazole (PROTONIX) 40 MG tablet Take 1 tablet (40 mg total) by mouth daily. 30 tablet 0  . potassium chloride 20 MEQ TBCR Take 20 mEq by mouth daily. 30 tablet 2  . sacubitril-valsartan (ENTRESTO) 49-51 MG Take 1 tablet by mouth 2 (two) times daily. Discontinue current valsartan (diovan) 60 tablet 5  . Tiotropium Bromide Monohydrate (SPIRIVA RESPIMAT) 2.5 MCG/ACT AERS Inhale 2.5 Act into the lungs 2 (two) times daily. 1 Inhaler 12    Results for orders placed or performed during the hospital encounter of 09/08/17 (from  the past 48 hour(s))  Basic metabolic panel     Status: Abnormal   Collection Time: 09/08/17  3:51 AM  Result Value Ref Range   Sodium 139 135 - 145 mmol/L   Potassium 3.6 3.5 - 5.1 mmol/L   Chloride 109 101 - 111 mmol/L   CO2 22 22 - 32 mmol/L   Glucose, Bld 118 (H) 65 - 99 mg/dL   BUN 19 6 - 20 mg/dL   Creatinine, Ser 0.92 0.61 - 1.24 mg/dL   Calcium 9.1 8.9 - 10.3 mg/dL   GFR calc non Af Amer >60 >60 mL/min   GFR calc Af Amer >60 >60 mL/min    Comment: (NOTE) The eGFR has been calculated using the CKD EPI equation. This calculation has not been validated in all clinical situations. eGFR's persistently <60 mL/min signify possible Chronic Kidney Disease.    Anion gap 8 5 - 15  CBC     Status: Abnormal   Collection Time: 09/08/17  3:51 AM  Result Value Ref Range   WBC 7.8 3.8 - 10.6 K/uL   RBC 5.00 4.40 - 5.90 MIL/uL   Hemoglobin 14.9 13.0 - 18.0 g/dL   HCT 44.6 40.0 - 52.0 %   MCV 89.3 80.0 - 100.0 fL   MCH 29.9 26.0 - 34.0 pg   MCHC 33.4 32.0 - 36.0 g/dL   RDW 15.5 (H) 11.5 - 14.5 %   Platelets 166 150 - 440 K/uL  Troponin I     Status: Abnormal   Collection Time: 09/08/17  3:51 AM  Result Value Ref Range   Troponin I 0.12 (HH) <0.03 ng/mL    Comment: CRITICAL RESULT CALLED TO, READ BACK BY AND VERIFIED WITH DAWN [facility] AT 6073 09/08/17.PMH  TSH     Status: None   Collection Time: 09/08/17  3:51 AM  Result Value Ref Range   TSH 1.539 0.350 - 4.500 uIU/mL    Comment: Performed by a 3rd Generation assay with a functional sensitivity of <=0.01 uIU/mL.   Dg Chest 2 View  Result Date: 09/08/2017 CLINICAL DATA:  Shortness of breath and right upper back pain radiating to the right chest. History of CHF. EXAM: CHEST  2 VIEW COMPARISON:  07/29/2017 FINDINGS: Cardiac enlargement with mild pulmonary vascular congestion. Interstitial infiltrates in the lung bases. Mild perihilar infiltration. Changes likely representing edema. No blunting of costophrenic angles. No pneumothorax.  Tortuous aorta. Similar appearance to previous study. IMPRESSION: Cardiac enlargement with pulmonary vascular congestion and interstitial and perihilar edema. Similar appearance to previous study. Electronically Signed   By: Lucienne Capers M.D.   On: 09/08/2017 04:14    Review of Systems  Constitutional: Negative for chills and fever.  HENT: Negative for sore throat and tinnitus.   Eyes: Negative for blurred vision and redness.  Respiratory: Negative for cough and shortness of breath.  Cardiovascular: Positive for chest pain. Negative for palpitations, orthopnea and PND.  Gastrointestinal: Negative for abdominal pain, diarrhea, nausea and vomiting.  Genitourinary: Negative for dysuria, frequency and urgency.  Musculoskeletal: Negative for joint pain and myalgias.  Skin: Negative for rash.       No lesions  Neurological: Negative for speech change, focal weakness and weakness.  Endo/Heme/Allergies: Does not bruise/bleed easily.       No temperature intolerance  Psychiatric/Behavioral: Negative for depression and suicidal ideas.    Blood pressure (!) 122/93, pulse 75, temperature 98 F (36.7 C), resp. rate 18, height _0  (1.88 m), weight 108.4 kg (239 lb), SpO2 99 %. Physical Exam  Vitals reviewed. Constitutional: He is oriented to person, place, and time. He appears well-developed and well-nourished. No distress.  HENT:  Head: Normocephalic and atraumatic.  Mouth/Throat: Oropharynx is clear and moist.  Eyes: Pupils are equal, round, and reactive to light. Conjunctivae and EOM are normal. No scleral icterus.  Neck: Normal range of motion. Neck supple. No JVD present. No tracheal deviation present. No thyromegaly present.  Cardiovascular: Normal rate and regular rhythm.  Exam reveals no gallop and no friction rub.   No murmur heard. Respiratory: Effort normal and breath sounds normal.  GI: Soft. Bowel sounds are normal. He exhibits no distension. There is no tenderness.   Genitourinary:  Genitourinary Comments: Deferred  Musculoskeletal: Normal range of motion. He exhibits no edema.  Lymphadenopathy:    He has no cervical adenopathy.  Neurological: He is alert and oriented to person, place, and time. No cranial nerve deficit.  Skin: Skin is warm and dry. No rash noted. No erythema.  Psychiatric: He has a normal mood and affect. His behavior is normal. Judgment and thought content normal.     Assessment/Plan This is a 51 year old male admitted for NSTEMI. 1. NSTEMI: elevated troponin, no ST segment elevation. Patient has ongoing intermittent pain. Continue to follow cardiac biomarkers. Monitor telemetry. Consult cardiology. I have made the patient nothing by mouth in anticipation of possible cardiac catheterization. Continue Plavix. I have started the patient on aspirin as well 2.CHF: Systolic; chronic. The patient's lungs are clear although there is edema on x-ray as well as nonpitting edema of his lower extremities. I have given him Lasix 40 mg IV x1. Continue diuretic therapy per home regimen as well. Last EF approximately 25%. The patient may need a AICD. Continue Entresto and amiodarone 3. Hypertension: diastolic hypertension; continue carvedilol. Consider long-acting nitrate as well 4.COPD: Continue Spiriva and inhaled corticosteroid. 5. DVT prophylaxis: Therapeutic Lovenox 6. GI prophylaxis: Pantoprazole The patient is a full code. Time spent on admission orders and patient care approximately 45 minutes  Harrie Foreman, MD 09/08/2017, 7:50 AM

## 2017-09-09 ENCOUNTER — Encounter: Admission: EM | Disposition: A | Discharge status: Home / Self Care | Payer: Self-pay | Source: Home / Self Care

## 2017-09-09 ENCOUNTER — Encounter: Payer: Self-pay | Admitting: Cardiology

## 2017-09-09 ENCOUNTER — Inpatient Hospital Stay: Payer: Commercial Managed Care - PPO

## 2017-09-09 ENCOUNTER — Telehealth: Payer: Self-pay | Admitting: Internal Medicine

## 2017-09-09 HISTORY — PX: LEFT HEART CATH AND CORONARY ANGIOGRAPHY: CATH118249

## 2017-09-09 LAB — CBC
HEMATOCRIT: 44.1 % (ref 40.0–52.0)
HEMOGLOBIN: 14.6 g/dL (ref 13.0–18.0)
MCH: 30 pg (ref 26.0–34.0)
MCHC: 33.2 g/dL (ref 32.0–36.0)
MCV: 90.4 fL (ref 80.0–100.0)
Platelets: 165 10*3/uL (ref 150–440)
RBC: 4.88 MIL/uL (ref 4.40–5.90)
RDW: 15.6 % — ABNORMAL HIGH (ref 11.5–14.5)
WBC: 6.7 10*3/uL (ref 3.8–10.6)

## 2017-09-09 LAB — PROTIME-INR
INR: 1.04
PROTHROMBIN TIME: 13.5 s (ref 11.4–15.2)

## 2017-09-09 LAB — BASIC METABOLIC PANEL
ANION GAP: 7 (ref 5–15)
BUN: 24 mg/dL — ABNORMAL HIGH (ref 6–20)
CHLORIDE: 108 mmol/L (ref 101–111)
CO2: 25 mmol/L (ref 22–32)
Calcium: 9 mg/dL (ref 8.9–10.3)
Creatinine, Ser: 1.21 mg/dL (ref 0.61–1.24)
GFR calc non Af Amer: >60 mL/min (ref >60)
Glucose, Bld: 126 mg/dL — ABNORMAL HIGH (ref 65–99)
Potassium: 4.1 mmol/L (ref 3.5–5.1)
SODIUM: 140 mmol/L (ref 135–145)

## 2017-09-09 SURGERY — LEFT HEART CATH AND CORONARY ANGIOGRAPHY
Anesthesia: Moderate Sedation

## 2017-09-09 MED ORDER — FUROSEMIDE 10 MG/ML IJ SOLN
INTRAMUSCULAR | Status: AC
  Filled 2017-09-09: qty 4

## 2017-09-09 MED ORDER — FUROSEMIDE 10 MG/ML IJ SOLN
INTRAMUSCULAR | Status: DC | PRN
  Administered 2017-09-09: 20 mg via INTRAVENOUS

## 2017-09-09 MED ORDER — MIDAZOLAM HCL 2 MG/2ML IJ SOLN
INTRAMUSCULAR | Status: DC | PRN
  Administered 2017-09-09: 1 mg via INTRAVENOUS

## 2017-09-09 MED ORDER — SODIUM CHLORIDE 0.9 % IV SOLN
250.0000 mL | INTRAVENOUS | Status: DC | PRN
  Administered 2017-09-09: 250 mL via INTRAVENOUS

## 2017-09-09 MED ORDER — ACETAMINOPHEN 325 MG PO TABS: 650.0000 mg | ORAL_TABLET | ORAL | Status: DC | PRN

## 2017-09-09 MED ORDER — SODIUM CHLORIDE 0.9 % IV SOLN: 250.0000 mL | INTRAVENOUS | Status: DC | PRN

## 2017-09-09 MED ORDER — ONDANSETRON HCL 4 MG/2ML IJ SOLN: 4.0000 mg | Freq: Four times a day (QID) | INTRAMUSCULAR | Status: DC | PRN

## 2017-09-09 MED ORDER — IOPAMIDOL (ISOVUE-300) INJECTION 61%
INTRAVENOUS | Status: DC | PRN
  Administered 2017-09-09: 75 mL via INTRA_ARTERIAL

## 2017-09-09 MED ORDER — MIDAZOLAM HCL 2 MG/2ML IJ SOLN
INTRAMUSCULAR | Status: AC
  Filled 2017-09-09: qty 2

## 2017-09-09 MED ORDER — FENTANYL CITRATE (PF) 100 MCG/2ML IJ SOLN
INTRAMUSCULAR | Status: DC | PRN
  Administered 2017-09-09 (×2): 25 ug via INTRAVENOUS

## 2017-09-09 MED ORDER — LIDOCAINE HCL (PF) 1 % IJ SOLN
INTRAMUSCULAR | Status: AC
  Filled 2017-09-09: qty 30

## 2017-09-09 MED ORDER — SODIUM CHLORIDE 0.9% FLUSH
3.0000 mL | INTRAVENOUS | Status: DC | PRN
  Administered 2017-09-10 (×2): 3 mL via INTRAVENOUS
  Filled 2017-09-09 (×2): qty 3

## 2017-09-09 MED ORDER — FENTANYL CITRATE (PF) 100 MCG/2ML IJ SOLN
INTRAMUSCULAR | Status: AC
  Filled 2017-09-09: qty 2

## 2017-09-09 MED ORDER — SODIUM CHLORIDE 0.9% FLUSH
3.0000 mL | Freq: Two times a day (BID) | INTRAVENOUS | Status: DC
  Administered 2017-09-09 – 2017-09-10 (×2): 3 mL via INTRAVENOUS

## 2017-09-09 SURGICAL SUPPLY — 9 items
CATH INFINITI 5FR ANG PIGTAIL (CATHETERS) ×3 IMPLANT
CATH INFINITI 5FR JL4 (CATHETERS) ×3 IMPLANT
CATH INFINITI JR4 5F (CATHETERS) ×3 IMPLANT
DEVICE CLOSURE MYNXGRIP 5F (Vascular Products) ×3 IMPLANT
GUIDEWIRE 3MM J TIP .035 145 (WIRE) ×3 IMPLANT
KIT MANI 3VAL PERCEP (MISCELLANEOUS) ×3 IMPLANT
NEEDLE PERC 18GX7CM (NEEDLE) ×3 IMPLANT
PACK CARDIAC CATH (CUSTOM PROCEDURE TRAY) ×3 IMPLANT
SHEATH AVANTI 5FR X 11CM (SHEATH) ×3 IMPLANT

## 2017-09-09 NOTE — Progress Notes (Signed)
[place] [facility] [place] NAME: Stephen [name]    MR#:  [id number]  DATE OF BIRTH:  06-Mar-1966  SUBJECTIVE:  CHIEF COMPLAINT:  Patient is reporting chest pain with deep breaths. Had cardiac catheter today  REVIEW OF SYSTEMS:  CONSTITUTIONAL: No fever, fatigue or weakness.  EYES: No blurred or double vision.  EARS, NOSE, AND THROAT: No tinnitus or ear pain.  RESPIRATORY: No cough, reporting exertionalshortness of breath,chest pain with deep breaths denies wheezing or hemoptysis.  CARDIOVASCULAR: No chest pain, orthopnea, edema.  GASTROINTESTINAL: No nausea, vomiting, diarrhea or abdominal pain.  GENITOURINARY: No dysuria, hematuria.  ENDOCRINE: No polyuria, nocturia,  HEMATOLOGY: No anemia, easy bruising or bleeding SKIN: No rash or lesion. MUSCULOSKELETAL: No joint pain or arthritis.   NEUROLOGIC: No tingling, numbness, weakness.  PSYCHIATRY: No anxiety or depression.   DRUG ALLERGIES:   Allergies  Allergen Reactions  . Lisinopril Cough    VITALS:  Blood pressure 102/67, pulse 72, temperature 97.8 F (36.6 C), resp. rate 18, height 6\' 2"  (1.88 m), weight 108 kg (238 lb), SpO2 100 %.  PHYSICAL EXAMINATION:  GENERAL:  51-year-old patient lying in the bed with no acute distress.  EYES: Pupils equal, round, reactive to light and accommodation. No scleral icterus. Extraocular muscles intact.  HEENT: Head atraumatic, normocephalic. Oropharynx and nasopharynx clear.  NECK:  Supple, no jugular venous distention. No thyroid enlargement, no tenderness.  LUNGS: moderate breath sounds bilaterally, positive rales and rhonchi bilaterally;no wheezing, crepitation. No use of accessory muscles of respiration.  CARDIOVASCULAR: S1, S2 normal. No murmurs, rubs, or gallops.  ABDOMEN: Soft, nontender, nondistended. Bowel sounds present. No organomegaly or mass.  EXTREMITIES: No pedal edema, cyanosis, or clubbing.  NEUROLOGIC: Cranial nerves II  through XII are intact. Muscle strength 5/5 in all extremities. Sensation intact. Gait not checked.  PSYCHIATRIC: The patient is alert and oriented x 3.  SKIN: No obvious rash, lesion, or ulcer.    LABORATORY PANEL:   CBC  Recent Labs Lab 09/09/17 0508  WBC 6.7  HGB 14.6  HCT 44.1  PLT 165   ------------------------------------------------------------------------------------------------------------------  Chemistries   Recent Labs Lab 09/09/17 0508  NA 140  K 4.1  CL 108  CO2 25  GLUCOSE 126*  BUN 24*  CREATININE 1.21  CALCIUM 9.0   ------------------------------------------------------------------------------------------------------------------  Cardiac Enzymes  Recent Labs Lab 09/08/17 0351  TROPONINI 0.12*   ------------------------------------------------------------------------------------------------------------------  RADIOLOGY:  Dg Chest 2 View  Result Date: 09/08/2017 CLINICAL DATA:  Shortness of breath and right upper back pain radiating to the right chest. History of CHF. EXAM: CHEST  2 VIEW COMPARISON:  07/29/2017 FINDINGS: Cardiac enlargement with mild pulmonary vascular congestion. Interstitial infiltrates in the lung bases. Mild perihilar infiltration. Changes likely representing edema. No blunting of costophrenic angles. No pneumothorax. Tortuous aorta. Similar appearance to previous study. IMPRESSION: Cardiac enlargement with pulmonary vascular congestion and interstitial and perihilar edema. Similar appearance to previous study. Electronically Signed   By: Lucienne Capers M.D.   On: 09/08/2017 04:14   Ct Chest Wo Contrast  Result Date: 09/09/2017 CLINICAL DATA:  Inpatient. Evaluate for pleural effusion. Chest pain. Dyspnea. History of CHF. EXAM: CT CHEST WITHOUT CONTRAST TECHNIQUE: Multidetector CT imaging of the chest was performed following the standard protocol without IV contrast. COMPARISON:  Chest radiograph from one day prior. 11/19/2015  chest CT angiogram. FINDINGS: Motion degraded scan. Cardiovascular: Mild-to-moderate cardiomegaly, increased since 11/19/2015 chest CT. No significant pericardial fluid/thickening. Atherosclerotic nonaneurysmal thoracic aorta. Top-normal caliber  main pulmonary artery (3.2 cm diameter). Mediastinum/Nodes: Subcentimeter hypodense bilateral thyroid lobe nodules, not appreciably changed. Unremarkable esophagus. No axillary adenopathy. Mildly enlarged 1.3 cm right paratracheal node (series 2/image 60) is stable since 11/19/2015, compatible with benign reactive adenopathy. No additional pathologically enlarged mediastinal or gross hilar nodes on this noncontrast scan. Lungs/Pleura: No pneumothorax. Trace dependent right pleural effusion. No left pleural effusion. Diffuse interlobular septal thickening. Bandlike opacities in the lingula and anterior left lower lobe with associated mild volume loss, compatible with platelike atelectasis and/or postinfectious/postinflammatory scarring. Otherwise no acute consolidative airspace disease, lung masses or significant pulmonary nodules. Upper abdomen: Unremarkable. Musculoskeletal: No aggressive appearing focal osseous lesions. Mild thoracic spondylosis. IMPRESSION: 1. Mild-to-moderate cardiomegaly. Diffuse interlobular septal thickening compatible with mild pulmonary edema. Findings are compatible with mild congestive heart failure. 2. Trace dependent right pleural effusion. No left pleural effusion. Aortic Atherosclerosis (ICD10-I70.0). Electronically Signed   By: Ilona Sorrel M.D.   On: 09/09/2017 12:20    EKG:   Orders placed or performed during the hospital encounter of 09/08/17  . ED EKG within 10 minutes  . ED EKG within 10 minutes  . EKG 12-Lead  . EKG 12-Lead  . EKG 12-Lead  . EKG 12-Lead  . EKG 12-Lead  . EKG 12-Lead  . EKG    ASSESSMENT AND PLAN:   Patient with history of CHF and hypertension is presenting with chest pain with shortness of  breath  #chest pain- improved Monitor patient on telemetry, troponin 0.12 cardiac catheterization done today has revealed normal coronaries with no blockages Follow-up with [facility] cardiology Continue aspirin, Plavix, Coreg, statin Sublingual nitrogen as needed basis  #acute on chronic systolic CHF exacerbation Lasix 40 mg po  every 12 hours Continue aspirin, Plavix, Coreg and statin Monitor intake and output and daily weights Follow-up with [facility]ology Normal TSH CT chest with the pulmonary edema and no pulmonary embolism  #. Hypertension: diastolic hypertension; continue carvedilol.   #COPD:no exacerbation Continue Spiriva and inhaled corticosteroid.   # DVT prophylaxis:  Lovenox 6. GI prophylaxis: Pantoprazole   Anticipate discharge in a.m.  All the records are reviewed and case discussed with Care Management/Social Workerr. Management plans discussed with the patient, family and they are in agreement.  CODE STATUS: fc   TOTAL TIME TAKING CARE OF THIS PATIENT: 36  minutes.   POSSIBLE D/C IN 1-2  DAYS, DEPENDING ON CLINICAL CONDITION.  Note: This dictation was prepared with Dragon dictation along with smaller phrase technology. Any transcriptional errors that result from this process are unintentional.   [name] M.D on 09/09/2017 at 1:11 PM  Between 7am to 6pm - Pager - [phone] After 6pm go to [email or web address] - password [email or web address] [facility]  [place] [facility]  Office  [phone]  CC: Primary care physician; [name], MD

## 2017-09-09 NOTE — Progress Notes (Addendum)
       [place] [facility]  SUBJECTIVE: sob   Vitals:   09/09/17 0935 09/09/17 0940 09/09/17 1035 09/09/17 1036  BP:   106/85 111/90  Pulse: 73  73 75  Resp: 12 (!) 47 17   Temp:   (!) 97.5 F (36.4 C)   TempSrc:   Oral   SpO2: 99%  99%   Weight:      Height:        Intake/Output Summary (Last 24 hours) at 09/09/17 1051 Last data filed at 09/09/17 0700  Gross per 24 hour  Intake            490.5 ml  Output             1650 ml  Net          -1159.5 ml    LABS: Basic Metabolic Panel:  Recent Labs  09/08/17 0351 09/09/17 0508  NA 139 140  K 3.6 4.1  CL 109 108  CO2 22 25  GLUCOSE 118* 126*  BUN 19 24*  CREATININE 0.92 1.21  CALCIUM 9.1 9.0   Liver Function Tests: No results for input(s): AST, ALT, ALKPHOS, BILITOT, PROT, ALBUMIN in the last 72 hours. No results for input(s): LIPASE, AMYLASE in the last 72 hours. CBC:  Recent Labs  09/08/17 0351 09/09/17 0508  WBC 7.8 6.7  HGB 14.9 14.6  HCT 44.6 44.1  MCV 89.3 90.4  PLT 166 165   Cardiac Enzymes:  Recent Labs  09/08/17 0351  TROPONINI 0.12*   BNP: Invalid input(s): POCBNP D-Dimer: No results for input(s): DDIMER in the last 72 hours. Hemoglobin A1C: No results for input(s): HGBA1C in the last 72 hours. Fasting Lipid Panel: No results for input(s): CHOL, HDL, LDLCALC, TRIG, CHOLHDL, LDLDIRECT in the last 72 hours. Thyroid Function Tests:  Recent Labs  09/08/17 0351  TSH 1.539   Anemia Panel: No results for input(s): VITAMINB12, FOLATE, FERRITIN, TIBC, IRON, RETICCTPCT in the last 72 hours.   Physical Exam: Blood pressure 111/90, pulse 75, temperature (!) 97.5 F (36.4 C), temperature source Oral, resp. rate 17, height 6\' 2"  (1.88 m), weight 108 kg (238 lb), SpO2 99 %.   Wt Readings from Last 1 Encounters:  09/09/17 108 kg (238 lb)     General appearance: alert and cooperative Resp: wheezes bibasilar Cardio: regular rate and rhythm GI: soft,  non-tender; bowel sounds normal; no masses,  no organomegaly Extremities: extremities normal, atraumatic, no cyanosis or edema Neurologic: Grossly normal  TELEMETRY: Reviewed telemetry pt in nsr:  ASSESSMENT AND PLAN:  Active Problems:   Chest pain-ruled out for nstemi. Had elevated troponin due to demand with reduced lv function and volume overload. Cardiac cath done today revealed normal coronary arteries. Will need continued diuresis and monitoring overnight to follow renal function post cath as well as response to medications. Would defer AICD discussion to his primary cardiologist as outpatient. Continue with Entresto, carvedilol, plavix, asa and amiodarone and careful diuresis.      Teodoro Spray, MD, [facility] 09/09/2017 10:51 AM

## 2017-09-09 NOTE — Telephone Encounter (Signed)
Pt authorized to discuss information with Nancie Neas, sister. Pt sister is calling to get PFT results.Also, she asks if pt should have 6 minute walk test.  Please call .

## 2017-09-09 NOTE — Progress Notes (Addendum)
Patient ruled out for NSTEMI on cardiac cath which was performed today by Dr. Ubaldo Glassing.  Cardiac Cath revealed normal coronary arteries.  Patient's elevated troponin was due to increased demand with reduced LV function and volume overload. Patient's last EF measured on echo performed on 05/21/2017 was 20 - 25%.  Discussed the definition of HF and was the meaning of the EF Measurement.  Stressed the importance of daily weights and assessment of Heart Failure symptoms as it relates to HF Zones.  Patient stated he has the "Living Better with Heart Failure" booklet at home and politely declined the offer of another booklet.  Patient stated he takes extra Lasix sometimes one or two or up to three extra if his weight has increased.  I cautioned patient as Lasix can also affect his kidney function.  I cautioned patient it  is important to have lab work drawn and results evaluated by HF Clinic or [name] to assess kidney function.  Patient verbalized understanding.  HF medications discussed.  I stressed the importance of taking medications as prescribed and contacting MD when HF symptoms are in the Yellow Zone.  Patient shared with me about his work.  He works full time at [employer] working on [employer].  He is able to do this job and would like to continue working until retirement age.  Low salt, low fat, low cholesterol, carb modified diet reviewed with patient.  Patient stated he does a great job at eating low sodium and knows what he can eat and not eat.  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  Exercise / Cardiac Rehab program discussed.  Patient stated he has thought about Cardiac Rehab, but he has not been able to participate due to his work schedule.  He would need an afternoon or evening class.  I informed him of our 4 PM to 6 PM class.  Patient informed this nurse that he might be able to participate after October. Order entered for Cardiac Rehab per  standing protocol.Cardiac Rehab brochure given to patient.    Patient has been followed in the [facility].  I explained to him that he would have an appointment scheduled in the [facility], as it is our protocol to see HF patients within 7 days of discharge.  Patient verbalized understanding.  Patient discussed with this nurse there is a possibility he will need a defibrillator given his low EF and increased risk for lethal arrhythmia.  Explained to patient the rationale for defibrillator and that it is a safety measure.  [name] to discuss with patient need for defibrillator during f/u appt.    Roanna Epley, RN, BSN, [facility],  Cardiovascular and Pulmonary Nurse Navigator 1:30 - 2:33

## 2017-09-09 NOTE — Telephone Encounter (Signed)
Please advise if you want pt to have SMW. Nothing in OV note. Also pt and sister are requesting PFT results.

## 2017-09-10 LAB — BASIC METABOLIC PANEL
ANION GAP: 8 (ref 5–15)
BUN: 24 mg/dL — ABNORMAL HIGH (ref 6–20)
CALCIUM: 9.1 mg/dL (ref 8.9–10.3)
CO2: 25 mmol/L (ref 22–32)
Chloride: 105 mmol/L (ref 101–111)
Creatinine, Ser: 1.28 mg/dL — ABNORMAL HIGH (ref 0.61–1.24)
Glucose, Bld: 116 mg/dL — ABNORMAL HIGH (ref 65–99)
Potassium: 4 mmol/L (ref 3.5–5.1)
Sodium: 138 mmol/L (ref 135–145)

## 2017-09-10 LAB — MAGNESIUM: Magnesium: 1.9 mg/dL (ref 1.7–2.4)

## 2017-09-10 MED ORDER — CYCLOBENZAPRINE HCL 10 MG PO TABS: 10.0000 mg | ORAL_TABLET | Freq: Three times a day (TID) | ORAL | 0 refills | Status: DC | PRN

## 2017-09-10 MED ORDER — POLYETHYLENE GLYCOL 3350 17 G PO PACK
17.0000 g | PACK | Freq: Every day | ORAL | Status: DC
  Administered 2017-09-10: 17 g via ORAL
  Filled 2017-09-10: qty 1

## 2017-09-10 MED ORDER — FUROSEMIDE 40 MG PO TABS: 40.0000 mg | ORAL_TABLET | Freq: Two times a day (BID) | ORAL | 0 refills | Status: DC

## 2017-09-10 MED ORDER — ASPIRIN 81 MG PO TBEC: 81.0000 mg | DELAYED_RELEASE_TABLET | Freq: Every day | ORAL | Status: DC

## 2017-09-10 MED ORDER — TRAMADOL HCL 50 MG PO TABS: 50.0000 mg | ORAL_TABLET | Freq: Four times a day (QID) | ORAL | 0 refills | Status: DC | PRN

## 2017-09-10 MED ORDER — BISACODYL 10 MG RE SUPP
10.0000 mg | Freq: Every day | RECTAL | Status: DC
  Administered 2017-09-10: 10 mg via RECTAL
  Filled 2017-09-10: qty 1

## 2017-09-10 MED ORDER — DOCUSATE SODIUM 100 MG PO CAPS: 100.0000 mg | ORAL_CAPSULE | Freq: Two times a day (BID) | ORAL | 0 refills | Status: DC | PRN

## 2017-09-10 MED ORDER — OXYCODONE-ACETAMINOPHEN 5-325 MG PO TABS
1.0000 | ORAL_TABLET | Freq: Once | ORAL | Status: AC
  Administered 2017-09-10: 1 via ORAL
  Filled 2017-09-10: qty 1

## 2017-09-10 MED ORDER — POLYETHYLENE GLYCOL 3350 17 G PO PACK: 17.0000 g | PACK | Freq: Every day | ORAL | 0 refills | Status: DC

## 2017-09-10 NOTE — Discharge Instructions (Signed)
Heart Failure Clinic appointment on September 13 2017 at 8:20am with [name], [place]. Please call [phone] to reschedule.   Follow-up with primary care physician in a week and follow-up with cardiology [place] in 2 weeks

## 2017-09-10 NOTE — Discharge Summary (Addendum)
[facility] NAME: Stephen [name]    MR#:  [id number]  DATE OF BIRTH:  March 28, 1966  DATE OF ADMISSION:  09/08/2017 ADMITTING PHYSICIAN: Harrie Foreman, MD  DATE OF DISCHARGE: No discharge date for patient encounter.  PRIMARY CARE PHYSICIAN: [name], MD    ADMISSION DIAGNOSIS:  NSTEMI (non-ST elevated myocardial infarction) ([place]) [I21.4]  DISCHARGE DIAGNOSIS:  CHEST PAIN   SECONDARY DIAGNOSIS:   Past Medical History:  Diagnosis Date  . CHF (congestive heart failure) (HCC)    nonischemic cardiomyopathy, EF 25%  . Hypertension   . TIA (transient ischemic attack) 06/21/16    HOSPITAL COURSE:   HPI: the patient with past medical history of systolic congestive heart failure, hypertension and previous TIA presents to the emergency department complaining of chest pain. His pain began at rest and associated with mild shortness of breath. The patient took nitroglycerin which relieved his chest pain some but gave him a severe headache.In the emergency department the patient's pain waxed and waned. It is still intermittent and nonradiating. The patient denies nausea, vomiting or diaphoresis. Troponin found to be elevated. Due to known heart disease as well as elevated biomarkers and ongoing pain in the emergency department called the hospitalist service for further evaluation.  #chest pain-Musculoskeletal improved Monitored patient on telemetry, troponin 0.12 cardiac catheterization done today has revealed normal coronaries with no blockages Follow-up with [facility] cardiology Continue aspirin, Plavix, Coreg, statin Sublingual nitrogen as needed basis Patient will be given muscle relaxants and oxycodone to go home  #acute on chronic systolic CHF exacerbation Lasix 40 mg po  every 12 hours Continue aspirin, Plavix, Coreg and statin Monitor intake and output and daily weights Follow-up with [facility] cardiology Normal TSH CT  chest with the pulmonary edema and no pulmonary embolism  #. Hypertension: diastolic hypertension; continue carvedilol.   #COPD:no exacerbation Continue Spiriva and inhaled corticosteroid.   # DVT prophylaxis:  Lovenox provided during the hospital course 6. GI prophylaxis: Pantoprazole  DISCHARGE CONDITIONS:   Fair  CONSULTS OBTAINED:  Treatment Team:  Teodoro Spray, MD   PROCEDURES Cardiac catheterization normal  DRUG ALLERGIES:   Allergies  Allergen Reactions  . Lisinopril Cough    DISCHARGE MEDICATIONS:   Current Discharge Medication List    START taking these medications   Details  aspirin EC 81 MG EC tablet Take 1 tablet (81 mg total) by mouth daily.    docusate sodium (COLACE) 100 MG capsule Take 1 capsule (100 mg total) by mouth 2 (two) times daily as needed for mild constipation. Qty: 10 capsule, Refills: 0    polyethylene glycol (MIRALAX / GLYCOLAX) packet Take 17 g by mouth daily. Qty: 14 each, Refills: 0    traMADol (ULTRAM) 50 MG tablet Take 1 tablet (50 mg total) by mouth every 6 (six) hours as needed. Qty: 20 tablet, Refills: 0      CONTINUE these medications which have CHANGED   Details  cyclobenzaprine (FLEXERIL) 10 MG tablet Take 1 tablet (10 mg total) by mouth 3 (three) times daily as needed for muscle spasms. Qty: 20 tablet, Refills: 0    furosemide (LASIX) 40 MG tablet Take 1 tablet (40 mg total) by mouth 2 (two) times daily. Qty: 60 tablet, Refills: 0      CONTINUE these medications which have NOT CHANGED   Details  acetaminophen (TYLENOL) 500 MG tablet Take 1 capsule by mouth every 4 (four) hours as needed for fever.  albuterol (PROVENTIL HFA;VENTOLIN HFA) 108 (90 Base) MCG/ACT inhaler Inhale 2 puffs into the lungs every 4 (four) hours as needed for wheezing or shortness of breath. Qty: 1 Inhaler, Refills: 0    amiodarone (PACERONE) 400 MG tablet Take 1 tablet (400 mg total) by mouth 2 (two) times daily. Qty: 30 tablet,  Refills: 0    atorvastatin (LIPITOR) 40 MG tablet Take 1 tablet (40 mg total) by mouth daily at 6 PM. Qty: 30 tablet, Refills: 2    Benzocaine (ANBESOL) 10 % LIQD Use as directed 1 application in the mouth or throat 4 (four) times daily as needed.    Camphor-Eucalyptus-Menthol (CHEST RUB) 4.8-1.2-2.6 % OINT Apply 1 application topically as needed.    carvedilol (COREG) 6.25 MG tablet Take 3.125 mg by mouth 2 (two) times daily.     clopidogrel (PLAVIX) 75 MG tablet Take 1 tablet (75 mg total) by mouth daily. Qty: 30 tablet, Refills: 2    diclofenac sodium (VOLTAREN) 1 % GEL Apply 2 g topically 4 (four) times daily.    fluticasone (FLONASE) 50 MCG/ACT nasal spray Place 2 sprays into both nostrils daily. Qty: 16 g, Refills: 2    Fluticasone-Salmeterol (ADVAIR DISKUS) 250-50 MCG/DOSE AEPB Inhale 1 puff into the lungs 2 (two) times daily. Qty: 1 each, Refills: 0    Multiple Vitamin (MULTIVITAMIN WITH MINERALS) TABS tablet Take 1 tablet by mouth daily.    nitroGLYCERIN (NITROSTAT) 0.4 MG SL tablet Place 1 tablet (0.4 mg total) under the tongue every 5 (five) minutes as needed for chest pain. Qty: 30 tablet, Refills: 0    pantoprazole (PROTONIX) 40 MG tablet Take 1 tablet (40 mg total) by mouth daily. Qty: 30 tablet, Refills: 0    potassium chloride 20 MEQ TBCR Take 20 mEq by mouth daily. Qty: 30 tablet, Refills: 2    sacubitril-valsartan (ENTRESTO) 49-51 MG Take 1 tablet by mouth 2 (two) times daily. Discontinue current valsartan (diovan) Qty: 60 tablet, Refills: 5    Tiotropium Bromide Monohydrate (SPIRIVA RESPIMAT) 2.5 MCG/ACT AERS Inhale 2.5 Act into the lungs 2 (two) times daily. Qty: 1 Inhaler, Refills: 12         DISCHARGE INSTRUCTIONS:   Follow-up with primary care physician in a week and follow-up with cardiology [place] in 2 weeks  DIET:  Cardiac diet  DISCHARGE CONDITION:  Stable  ACTIVITY:  Activity as tolerated  OXYGEN:  Home Oxygen: No.   Oxygen Delivery:  room air  DISCHARGE LOCATION:  home   If you experience worsening of your admission symptoms, develop shortness of breath, life threatening emergency, suicidal or homicidal thoughts you must seek medical attention immediately by calling 911 or calling your MD immediately  if symptoms less severe.  You Must read complete instructions/literature along with all the possible adverse reactions/side effects for all the Medicines you take and that have been prescribed to you. Take any new Medicines after you have completely understood and accpet all the possible adverse reactions/side effects.   Please note  You were cared for by a hospitalist during your hospital stay. If you have any questions about your discharge medications or the care you received while you were in the hospital after you are discharged, you can call the unit and asked to speak with the hospitalist on call if the hospitalist that took care of you is not available. Once you are discharged, your primary care physician will handle any further medical issues. Please note that NO REFILLS for any discharge medications will be  authorized once you are discharged, as it is imperative that you return to your primary care physician (or establish a relationship with a primary care physician if you do not have one) for your aftercare needs so that they can reassess your need for medications and monitor your lab values.     Today  Chief Complaint  Patient presents with  . Shortness of Breath  . Back Pain   Patient is reporting upper back pain on the right side, hurts with movements. Denies any shortness of breath. Wants to go home with pain medicine muscle relaxants  ROS:  CONSTITUTIONAL: Denies fevers, chills. Denies any fatigue, weakness.  EYES: Denies blurry vision, double vision, eye pain. EARS, NOSE, THROAT: Denies tinnitus, ear pain, hearing loss. RESPIRATORY: Denies cough, wheeze, shortness of breath.  CARDIOVASCULAR: Denies chest  pain, palpitations, edema.  GASTROINTESTINAL: Denies nausea, vomiting, diarrhea, abdominal pain. Denies bright red blood per rectum. GENITOURINARY: Denies dysuria, hematuria. ENDOCRINE: Denies nocturia or thyroid problems. HEMATOLOGIC AND LYMPHATIC: Denies easy bruising or bleeding. SKIN: Denies rash or lesion. MUSCULOSKELETAL: Reporting upper back pain on the right side Denies pain in neck, shoulder, knees, hips or arthritic symptoms.  NEUROLOGIC: Denies paralysis, paresthesias.  PSYCHIATRIC: Denies anxiety or depressive symptoms.   VITAL SIGNS:  Blood pressure 100/76, pulse 75, temperature 98.1 F (36.7 C), temperature source Oral, resp. rate 20, height 6\' 2"  (1.88 m), weight 108.3 kg (238 lb 11.2 oz), SpO2 98 %.  I/O:    Intake/Output Summary (Last 24 hours) at 09/10/17 1719 Last data filed at 09/10/17 1019  Gross per 24 hour  Intake              723 ml  Output              775 ml  Net              -52 ml    PHYSICAL EXAMINATION:  GENERAL:  51 y.o.-year-old patient lying in the bed with no acute distress.  EYES: Pupils equal, round, reactive to light and accommodation. No scleral icterus. Extraocular muscles intact.  HEENT: Head atraumatic, normocephalic. Oropharynx and nasopharynx clear.  NECK:  Supple, no jugular venous distention. No thyroid enlargement, no tenderness.  LUNGS: Normal breath sounds bilaterally, no wheezing, rales,rhonchi or crepitation. No use of accessory muscles of respiration.  CARDIOVASCULAR: S1, S2 normal. No murmurs, rubs, or gallops.  ABDOMEN: Soft, non-tender, non-distended. Bowel sounds present. No organomegaly or mass.  EXTREMITIES: Reproducible tenderness on the right upper back No pedal edema, cyanosis, or clubbing.  NEUROLOGIC: Cranial nerves II through XII are intact. Muscle strength 5/5 in all extremities. Sensation intact. Gait not checked.  PSYCHIATRIC: The patient is alert and oriented x 3.  SKIN: No obvious rash, lesion, or ulcer.   DATA  REVIEW:   CBC  Recent Labs Lab 09/09/17 0508  WBC 6.7  HGB 14.6  HCT 44.1  PLT 165    Chemistries   Recent Labs Lab 09/10/17 0634 09/10/17 1333  NA 138  --   K 4.0  --   CL 105  --   CO2 25  --   GLUCOSE 116*  --   BUN 24*  --   CREATININE 1.28*  --   CALCIUM 9.1  --   MG  --  1.9    Cardiac Enzymes  Recent Labs Lab 09/08/17 0351  TROPONINI 0.12*    Microbiology Results  No results found for this or any previous visit.  RADIOLOGY:  Dg Chest  2 View  Result Date: 09/08/2017 CLINICAL DATA:  Shortness of breath and right upper back pain radiating to the right chest. History of CHF. EXAM: CHEST  2 VIEW COMPARISON:  07/29/2017 FINDINGS: Cardiac enlargement with mild pulmonary vascular congestion. Interstitial infiltrates in the lung bases. Mild perihilar infiltration. Changes likely representing edema. No blunting of costophrenic angles. No pneumothorax. Tortuous aorta. Similar appearance to previous study. IMPRESSION: Cardiac enlargement with pulmonary vascular congestion and interstitial and perihilar edema. Similar appearance to previous study. Electronically Signed   By: Lucienne Capers M.D.   On: 09/08/2017 04:14   Ct Chest Wo Contrast  Result Date: 09/09/2017 CLINICAL DATA:  Inpatient. Evaluate for pleural effusion. Chest pain. Dyspnea. History of CHF. EXAM: CT CHEST WITHOUT CONTRAST TECHNIQUE: Multidetector CT imaging of the chest was performed following the standard protocol without IV contrast. COMPARISON:  Chest radiograph from one day prior. 11/19/2015 chest CT angiogram. FINDINGS: Motion degraded scan. Cardiovascular: Mild-to-moderate cardiomegaly, increased since 11/19/2015 chest CT. No significant pericardial fluid/thickening. Atherosclerotic nonaneurysmal thoracic aorta. Top-normal caliber main pulmonary artery (3.2 cm diameter). Mediastinum/Nodes: Subcentimeter hypodense bilateral thyroid lobe nodules, not appreciably changed. Unremarkable esophagus. No  axillary adenopathy. Mildly enlarged 1.3 cm right paratracheal node (series 2/image 60) is stable since 11/19/2015, compatible with benign reactive adenopathy. No additional pathologically enlarged mediastinal or gross hilar nodes on this noncontrast scan. Lungs/Pleura: No pneumothorax. Trace dependent right pleural effusion. No left pleural effusion. Diffuse interlobular septal thickening. Bandlike opacities in the lingula and anterior left lower lobe with associated mild volume loss, compatible with platelike atelectasis and/or postinfectious/postinflammatory scarring. Otherwise no acute consolidative airspace disease, lung masses or significant pulmonary nodules. Upper abdomen: Unremarkable. Musculoskeletal: No aggressive appearing focal osseous lesions. Mild thoracic spondylosis. IMPRESSION: 1. Mild-to-moderate cardiomegaly. Diffuse interlobular septal thickening compatible with mild pulmonary edema. Findings are compatible with mild congestive heart failure. 2. Trace dependent right pleural effusion. No left pleural effusion. Aortic Atherosclerosis (ICD10-I70.0). Electronically Signed   By: Ilona Sorrel M.D.   On: 09/09/2017 12:20    EKG:   Orders placed or performed during the hospital encounter of 09/08/17  . ED EKG within 10 minutes  . ED EKG within 10 minutes  . EKG 12-Lead  . EKG 12-Lead  . EKG 12-Lead  . EKG 12-Lead  . EKG 12-Lead  . EKG 12-Lead  . EKG      Management plans discussed with the patient, family and they are in agreement.  CODE STATUS:     Code Status Orders        Start     Ordered   09/08/17 0645  Full code  Continuous     09/08/17 0644    Code Status History    Date Active Date Inactive Code Status Order ID Comments User Context   07/29/2017 10:44 AM 07/31/2017  9:52 PM Full Code [id number]  Henreitta Leber, MD Inpatient   06/19/2017 12:27 AM 06/20/2017  9:14 PM Full Code [id number]  Lance Coon, MD ED   05/20/2017 12:26 PM 05/21/2017  5:59 PM Full Code  [id number]  [name], MD Inpatient   06/22/2016  4:30 AM 06/22/2016  9:50 PM Full Code [id number]  Quintella Baton, MD Inpatient   06/04/2016  6:43 PM 06/06/2016  6:15 PM Full Code [id number]  [name], MD Inpatient      TOTAL TIME TAKING CARE OF THIS PATIENT: 45 minutes.   Note: This dictation was prepared with Dragon dictation along with smaller phrase technology. Any transcriptional errors that result from this  process are unintentional.   @[facility] @  on 09/10/2017 at 5:19 PM  Between 7am to 6pm - Pager - [phone]  After 6pm go to [email or web address] - password [email or web address] [facility]  [facility]  Office  [phone]  CC: Primary care physician; [name], MD

## 2017-09-10 NOTE — Care Management (Signed)
No discharge needs identified by members of the care team 

## 2017-09-10 NOTE — Progress Notes (Addendum)
Rounded on patient to inform him of his HF Clinic appointment on 09/13/2017 at 8:20 a.m.  I explained to patient that the clinic is aware he usually likes afternoon appointments, but there were none available at the time the appointment was made.  I also explained to him that we like to see HF patients in the HF Clinic within 7 days of discharge.  Patient verbalized understanding.  Upon asking patient how he was feeling, patient stated he was having right sided chest pain radiating through to his back.  Patient stated this pain is consistent with his cardiac pain.  Notified his primary nurse, Thomas Hoff, who was going to check the monitor and assess patient.  Dietitian Consult entered.  Patient currently on renal / carb modified with fluid restriction diet.    Roanna Epley, RN, BSN, [facility] Cardiovascular and Pulmonary Nurse Navigator Total of 15 minutes spent with patient.

## 2017-09-10 NOTE — Plan of Care (Signed)
Problem: Food- and Nutrition-Related Knowledge Deficit (NB-1.1) Goal: Nutrition education Formal process to instruct or train a patient/client in a skill or to impart knowledge to help patients/clients voluntarily manage or modify food choices and eating behavior to maintain or improve health. Outcome: Adequate for Discharge Nutrition Education Note  RD consulted for nutrition education regarding a Heart Healthy diet.   Lipid Panel     Component Value Date/Time   CHOL 173 06/22/2016 0755   TRIG 65 06/22/2016 0755   HDL 36 (L) 06/22/2016 0755   CHOLHDL 4.8 06/22/2016 0755   VLDL 13 06/22/2016 0755   LDLCALC 124 (H) 06/22/2016 0755    RD provided "Heart Healthy Nutrition Therapy" handout from the Academy of [employer]etics. Reviewed patient's dietary recall. Provided examples on ways to decrease sodium and fat intake in diet. Discouraged intake of processed foods and use of salt shaker. Encouraged fresh fruits and vegetables as well as whole grain sources of carbohydrates to maximize fiber intake. Teach back method used.  Expect  fair compliance.  Body mass index is 30.65 kg/m. Pt meets criteria for obese class I based on current BMI.  Current diet order is 2 gram sodium, carb modified, patient is consuming approximately 100% of meals at this time. Labs and medications reviewed. No further nutrition interventions warranted at this time. RD contact information provided. If additional nutrition issues arise, please re-consult RD.  Stephen [name]. Stephen Barnard, MS, RD LDN Inpatient Clinical Dietitian Pager [phone]

## 2017-09-10 NOTE — Progress Notes (Signed)
MD notified. Pt had 7 beats of VT this a.m. Orders to check magnesium sat received. I will continue to assess.

## 2017-09-13 ENCOUNTER — Ambulatory Visit: Payer: Commercial Managed Care - PPO | Admitting: Family

## 2017-09-16 ENCOUNTER — Telehealth: Payer: Self-pay | Admitting: Internal Medicine

## 2017-09-16 ENCOUNTER — Encounter: Payer: Self-pay | Admitting: Internal Medicine

## 2017-09-16 NOTE — Telephone Encounter (Signed)
3 attempts to r/s appt due to provider schedule change. LMOV . Mailed letter.

## 2017-09-18 ENCOUNTER — Ambulatory Visit: Payer: Commercial Managed Care - PPO | Admitting: Family

## 2017-09-27 ENCOUNTER — Emergency Department (HOSPITAL_COMMUNITY): Payer: Commercial Managed Care - PPO

## 2017-09-27 ENCOUNTER — Encounter (HOSPITAL_COMMUNITY): Payer: Self-pay | Admitting: Emergency Medicine

## 2017-09-27 ENCOUNTER — Emergency Department (HOSPITAL_COMMUNITY)
Admission: EM | Admit: 2017-09-27 | Discharge: 2017-09-28 | Disposition: A | Discharge status: Home / Self Care | Payer: Commercial Managed Care - PPO | Attending: Emergency Medicine | Admitting: Emergency Medicine

## 2017-09-27 DIAGNOSIS — Z7982 Long term (current) use of aspirin: Secondary | ICD-10-CM | POA: Insufficient documentation

## 2017-09-27 DIAGNOSIS — Z87891 Personal history of nicotine dependence: Secondary | ICD-10-CM | POA: Diagnosis not present

## 2017-09-27 DIAGNOSIS — J449 Chronic obstructive pulmonary disease, unspecified: Secondary | ICD-10-CM | POA: Insufficient documentation

## 2017-09-27 DIAGNOSIS — Z7902 Long term (current) use of antithrombotics/antiplatelets: Secondary | ICD-10-CM | POA: Insufficient documentation

## 2017-09-27 DIAGNOSIS — Z79899 Other long term (current) drug therapy: Secondary | ICD-10-CM | POA: Diagnosis not present

## 2017-09-27 DIAGNOSIS — I5023 Acute on chronic systolic (congestive) heart failure: Secondary | ICD-10-CM | POA: Diagnosis not present

## 2017-09-27 DIAGNOSIS — R0602 Shortness of breath: Secondary | ICD-10-CM | POA: Diagnosis not present

## 2017-09-27 DIAGNOSIS — I110 Hypertensive heart disease with heart failure: Secondary | ICD-10-CM | POA: Insufficient documentation

## 2017-09-27 DIAGNOSIS — Z8673 Personal history of transient ischemic attack (TIA), and cerebral infarction without residual deficits: Secondary | ICD-10-CM | POA: Insufficient documentation

## 2017-09-27 LAB — BASIC METABOLIC PANEL
Anion gap: 10 (ref 5–15)
BUN: 23 mg/dL — AB (ref 6–20)
CHLORIDE: 102 mmol/L (ref 101–111)
CO2: 23 mmol/L (ref 22–32)
CREATININE: 1.48 mg/dL — AB (ref 0.61–1.24)
Calcium: 9.4 mg/dL (ref 8.9–10.3)
GFR calc Af Amer: >60 mL/min (ref >60)
GFR calc non Af Amer: 53 mL/min — ABNORMAL LOW (ref >60)
GLUCOSE: 108 mg/dL — AB (ref 65–99)
POTASSIUM: 3.3 mmol/L — AB (ref 3.5–5.1)
SODIUM: 135 mmol/L (ref 135–145)

## 2017-09-27 LAB — I-STAT TROPONIN, ED: Troponin i, poc: 0.07 ng/mL (ref 0.00–0.08)

## 2017-09-27 LAB — CBC
HEMATOCRIT: 44.1 % (ref 39.0–52.0)
Hemoglobin: 14.6 g/dL (ref 13.0–17.0)
MCH: 28.9 pg (ref 26.0–34.0)
MCHC: 33.1 g/dL (ref 30.0–36.0)
MCV: 87.3 fL (ref 78.0–100.0)
PLATELETS: 245 10*3/uL (ref 150–400)
RBC: 5.05 MIL/uL (ref 4.22–5.81)
RDW: 15.7 % — AB (ref 11.5–15.5)
WBC: 8.7 10*3/uL (ref 4.0–10.5)

## 2017-09-27 LAB — BRAIN NATRIURETIC PEPTIDE: B NATRIURETIC PEPTIDE 5: 2168.9 pg/mL — AB (ref 0.0–100.0)

## 2017-09-27 MED ORDER — ALBUTEROL SULFATE (2.5 MG/3ML) 0.083% IN NEBU
INHALATION_SOLUTION | RESPIRATORY_TRACT | Status: AC
  Filled 2017-09-27: qty 6

## 2017-09-27 MED ORDER — ALBUTEROL SULFATE (2.5 MG/3ML) 0.083% IN NEBU
5.0000 mg | INHALATION_SOLUTION | Freq: Once | RESPIRATORY_TRACT | Status: AC
  Administered 2017-09-27: 5 mg via RESPIRATORY_TRACT

## 2017-09-27 NOTE — ED Triage Notes (Signed)
Patient reports SOB with chest congestion/dry cough onset yesterday , denies fever or chills . No chest pain .

## 2017-09-28 ENCOUNTER — Emergency Department (HOSPITAL_COMMUNITY): Payer: Commercial Managed Care - PPO

## 2017-09-28 ENCOUNTER — Encounter (HOSPITAL_COMMUNITY): Payer: Self-pay | Admitting: Radiology

## 2017-09-28 LAB — D-DIMER, QUANTITATIVE: D-Dimer, Quant: 1.13 ug/mL-FEU — ABNORMAL HIGH (ref 0.00–0.50)

## 2017-09-28 MED ORDER — IOPAMIDOL (ISOVUE-370) INJECTION 76%
INTRAVENOUS | Status: AC
  Administered 2017-09-28: 100 mL
  Filled 2017-09-28: qty 100

## 2017-09-28 MED ORDER — FUROSEMIDE 20 MG PO TABS: 20.0000 mg | ORAL_TABLET | Freq: Two times a day (BID) | ORAL | 0 refills | Status: DC

## 2017-09-28 MED ORDER — FUROSEMIDE 10 MG/ML IJ SOLN
60.0000 mg | Freq: Once | INTRAMUSCULAR | Status: AC
  Administered 2017-09-28: 60 mg via INTRAVENOUS
  Filled 2017-09-28: qty 6

## 2017-09-28 NOTE — ED Notes (Signed)
Pt given urinal. Pt placed on 2L [place] for SpO2 dropping into the low 90s.

## 2017-09-28 NOTE — Discharge Instructions (Signed)
Please take your lasix, take 60 mg twice a day. Follow up with cardiology early next week. Return if worsening.

## 2017-09-28 NOTE — ED Provider Notes (Signed)
[facility] DEPT Provider Note   CSN: [id number] Arrival date & time: 09/27/17  1950     History   Chief Complaint Chief Complaint  Patient presents with  . Shortness of Breath    HPI Stephen [name] is a 51 y.o. male.  HPI Stephen [name] is a 51 y.o. male with history of hypertension, congestive heart failure, COPD, TIA, ventricular tachycardia, presents to emergency department with complaint of shortness of breath and right-sided back pain radiating to the chest. He states his symptoms began about 2 days ago. He reports similar episode about a month ago, states was admitted for heart failure. She states in the last 2 days she is unable to walk without getting short of breath. He reports increased shortness of breath laying down flat. He denies any new swelling in his extremities. He does report a nonproductive cough and pain in the right parascapular area that radiates into the right chest. He reports increased pain with deep breathing and coughing. He denies any fever or chills. He has no other complaints at this time.  Past Medical History:  Diagnosis Date  . CHF (congestive heart failure) (HCC)    nonischemic cardiomyopathy, EF 25%  . Hypertension   . TIA (transient ischemic attack) 06/21/16    Patient Active Problem List   Diagnosis Date Noted  . Chest pain 09/08/2017  . NSTEMI (non-ST elevated myocardial infarction) ([place]) 09/08/2017  . Bradycardia 08/30/2017  . Ventricular tachycardia ([place]) 07/29/2017  . COPD (chronic obstructive pulmonary disease) ([place]) 07/04/2017  . Chronic systolic CHF (congestive heart failure) ([place]) 05/20/2017  . TIA (transient ischemic attack) 06/22/2016  . HTN (hypertension) 06/22/2016  . Acute on chronic systolic CHF (congestive heart failure) ([place]) 06/04/2016    Past Surgical History:  Procedure Laterality Date  . KNEE SURGERY Right   . LEFT HEART CATH AND CORONARY ANGIOGRAPHY N/A 09/09/2017   Procedure: LEFT HEART CATH AND CORONARY  ANGIOGRAPHY;  Surgeon: Teodoro Spray, MD;  Location: [facility];  Service: Cardiovascular;  Laterality: N/A;  . VASECTOMY         Home Medications    Prior to Admission medications   Medication Sig Start Date End Date Taking? Authorizing Provider  acetaminophen (TYLENOL) 500 MG tablet Take 1 capsule by mouth every 4 (four) hours as needed for fever.    [provider]  albuterol (PROVENTIL HFA;VENTOLIN HFA) 108 (90 Base) MCG/ACT inhaler Inhale 2 puffs into the lungs every 4 (four) hours as needed for wheezing or shortness of breath. 03/22/17   [name], MD  amiodarone (PACERONE) 400 MG tablet Take 1 tablet (400 mg total) by mouth 2 (two) times daily. 07/31/17   [name], MD  aspirin EC 81 MG EC tablet Take 1 tablet (81 mg total) by mouth daily. 09/11/17   [name], MD  atorvastatin (LIPITOR) 40 MG tablet Take 1 tablet (40 mg total) by mouth daily at 6 PM. Patient taking differently: Take 40 mg by mouth daily.  06/22/16   [name], MD  Benzocaine (ANBESOL) 10 % LIQD Use as directed 1 application in the mouth or throat 4 (four) times daily as needed.    [provider]  Camphor-Eucalyptus-Menthol (CHEST RUB) 4.8-1.2-2.6 % OINT Apply 1 application topically as needed.    [provider]  carvedilol (COREG) 6.25 MG tablet Take 3.125 mg by mouth 2 (two) times daily.  06/20/16   [provider]  clopidogrel (PLAVIX) 75 MG tablet Take 1 tablet (75 mg  total) by mouth daily. 06/22/16   [name], MD  cyclobenzaprine (FLEXERIL) 10 MG tablet Take 1 tablet (10 mg total) by mouth 3 (three) times daily as needed for muscle spasms. 09/10/17   [name], MD  diclofenac sodium (VOLTAREN) 1 % GEL Apply 2 g topically 4 (four) times daily.    [provider]  docusate sodium (COLACE) 100 MG capsule Take 1 capsule (100 mg total) by mouth 2 (two) times daily as needed for mild constipation. 09/10/17   [name], [name], MD  fluticasone  (FLONASE) 50 MCG/ACT nasal spray Place 2 sprays into both nostrils daily. 06/20/17   [name], MD  Fluticasone-Salmeterol (ADVAIR DISKUS) 250-50 MCG/DOSE AEPB Inhale 1 puff into the lungs 2 (two) times daily. 06/20/17 06/20/18  [name], MD  furosemide (LASIX) 40 MG tablet Take 1 tablet (40 mg total) by mouth 2 (two) times daily. 09/10/17   [name], MD  Multiple Vitamin (MULTIVITAMIN WITH MINERALS) TABS tablet Take 1 tablet by mouth daily.    [provider]  nitroGLYCERIN (NITROSTAT) 0.4 MG SL tablet Place 1 tablet (0.4 mg total) under the tongue every 5 (five) minutes as needed for chest pain. 07/31/17   [name], MD  pantoprazole (PROTONIX) 40 MG tablet Take 1 tablet (40 mg total) by mouth daily. 06/20/17   [name], MD  polyethylene glycol [facility] / Floria Raveling) packet Take 17 g by mouth daily. 09/11/17   [name], [name], MD  potassium chloride 20 MEQ TBCR Take 20 mEq by mouth daily. 05/21/17   [name], MD  sacubitril-valsartan (ENTRESTO) 49-51 MG Take 1 tablet by mouth 2 (two) times daily. Discontinue current valsartan (diovan) 09/25/16   [name], FNP  Tiotropium Bromide Monohydrate (SPIRIVA RESPIMAT) 2.5 MCG/ACT AERS Inhale 2.5 Act into the lungs 2 (two) times daily. 07/25/17   [name], MD  traMADol (ULTRAM) 50 MG tablet Take 1 tablet (50 mg total) by mouth every 6 (six) hours as needed. 09/10/17 09/10/18  [name], MD    Family History Family History  Problem Relation Age of Onset  . Hypertension Mother   . Heart failure Mother   . Hypertension Father   . CAD Father   . Heart attack Father     Social History Social History  Substance Use Topics  . Smoking status: Former Smoker    Packs/day: 0.25    Years: 20.00    Types: Cigarettes    Quit date: 02/20/2016  . Smokeless tobacco: Never Used  . Alcohol use 0.0 oz/week     Comment: beer occasional     Allergies   Lisinopril   Review of Systems Review of Systems    Constitutional: Negative for chills and fever.  Respiratory: Positive for cough, chest tightness and shortness of breath.   Cardiovascular: Positive for chest pain. Negative for palpitations and leg swelling.  Gastrointestinal: Negative for abdominal distention, abdominal pain, diarrhea, nausea and vomiting.  Genitourinary: Negative for dysuria, frequency, hematuria and urgency.  Musculoskeletal: Positive for back pain. Negative for arthralgias, myalgias, neck pain and neck stiffness.  Skin: Negative for rash.  Allergic/Immunologic: Negative for immunocompromised state.  Neurological: Negative for dizziness, weakness, light-headedness, numbness and headaches.  All other systems reviewed and are negative.    Physical Exam Updated Vital Signs BP (!) 147/118 (BP Location: Left Arm)   Pulse 85   Temp 98.3 F (36.8 C) (Oral)   Resp 18   Ht 6\' 1"  (1.854 m)   Wt 107.5 kg (236 lb 14.4  oz)   SpO2 97%   BMI 31.26 kg/m   Physical Exam  Constitutional: He appears well-developed and well-nourished. No distress.  HENT:  Head: Normocephalic and atraumatic.  Eyes: Conjunctivae are normal.  Neck: Neck supple.  Cardiovascular: Normal rate, regular rhythm and normal heart sounds.   Pulmonary/Chest: Effort normal. No respiratory distress. He has no wheezes. He has no rales.  Abdominal: Soft. Bowel sounds are normal. He exhibits no distension. There is no tenderness. There is no rebound.  Musculoskeletal: He exhibits no edema.  Neurological: He is alert.  Skin: Skin is warm and dry.  Nursing note and vitals reviewed.    ED Treatments / Results  Labs (all labs ordered are listed, but only abnormal results are displayed) Labs Reviewed  BASIC METABOLIC PANEL - Abnormal; Notable for the following:       Result Value   Potassium 3.3 (*)    Glucose, Bld 108 (*)    BUN 23 (*)    Creatinine, Ser 1.48 (*)    GFR calc non Af Amer 53 (*)    All other components within normal limits  CBC -  Abnormal; Notable for the following:    RDW 15.7 (*)    All other components within normal limits  BRAIN NATRIURETIC PEPTIDE - Abnormal; Notable for the following:    B Natriuretic Peptide 2,168.9 (*)    All other components within normal limits  D-DIMER, QUANTITATIVE (NOT AT [facility])  I-STAT TROPONIN, ED    EKG  EKG Interpretation None       Radiology Dg Chest 2 View  Result Date: 09/27/2017 CLINICAL DATA:  Shortness of breath EXAM: CHEST  2 VIEW COMPARISON:  CT 09/09/2017, radiograph 09/08/2017 FINDINGS: Moderate-to-marked cardiomegaly as before. Central vascular congestion. No consolidation or pleural effusion. No pneumothorax. IMPRESSION: Stable moderate to marked cardiomegaly with central vascular congestion Electronically Signed   By: Donavan Foil M.D.   On: 09/27/2017 20:56    Procedures Procedures (including critical care time)  Medications Ordered in ED Medications  furosemide (LASIX) injection 60 mg (not administered)  albuterol (PROVENTIL) (2.5 MG/3ML) 0.083% nebulizer solution 5 mg (5 mg Nebulization Given 09/27/17 2006)     Initial Impression / Assessment and Plan / ED Course  I have reviewed the triage vital signs and the nursing notes.  Pertinent labs & imaging results that were available during my care of the patient were reviewed by me and considered in my medical decision making (see chart for details).     Patient with increased shortness of breath on the last 2 days. History of CHF, cardiac cath just 3 weeks ago showed normal coronary arteries, EF of 20-25%. Patient does report new pleuritic pain in his right chest and back. He states pain is severe and rates it as 9 out of 10. I will get labs in include d-dimer to rule out PE. Patient's vital signs are normal at this time. We'll continue to monitor. Will give Lasix.    4:45 AM CT angio negative. Pt ambulated in hall with normal pulse ox, lowed of 95%. I do not think pt needs admission today. He is  diurising in dept after lasix. Discussed with Dr. Betsey Holiday. Doubt ACS, given clear cath, no PE, no pneumonia. Possible CPOD, howver lungs are clear. Possible CHF exacerbation, however not hypoxic and diurising. Weight has not changed. I do not think pt needs admission at this time.  He is stable for dc home. Return precuations discussed.   Last vital signs documented  showing hypoxia, I went into the room myself and ajusted pulse ox to show good wave form and each time it would come up to high 90s. Again even when ambulating, oxygen sat stayed above 95% on RA.     Vitals:   09/28/17 0300 09/28/17 0400 09/28/17 0445 09/28/17 0530  BP: (!) 140/102 (!) 135/106 (!) 126/92 (!) 120/91  Pulse: 93 85 65 91  Resp: (!) 22 (!) 24 (!) 31 (!) 27  Temp:      TempSrc:      SpO2: 96% 97% (!) 88% (!) 84%  Weight:      Height:         Final Clinical Impressions(s) / ED Diagnoses   Final diagnoses:  Shortness of breath  Acute on chronic systolic congestive heart failure [facility])    New Prescriptions Discharge Medication List as of 09/28/2017  4:57 AM       Jeannett Senior, PA-C 09/28/17 0634    Jeannett Senior, PA-C 09/28/17 4935    Orpah Greek, MD 09/28/17 [phone]

## 2017-09-30 ENCOUNTER — Ambulatory Visit: Payer: Commercial Managed Care - PPO | Attending: Family | Admitting: Family

## 2017-09-30 ENCOUNTER — Encounter: Payer: Self-pay | Admitting: Family

## 2017-09-30 VITALS — BP 124/94 | HR 97 | Resp 18 | Ht 74.0 in | Wt 237.4 lb

## 2017-09-30 DIAGNOSIS — Z87891 Personal history of nicotine dependence: Secondary | ICD-10-CM | POA: Insufficient documentation

## 2017-09-30 DIAGNOSIS — I429 Cardiomyopathy, unspecified: Secondary | ICD-10-CM | POA: Diagnosis not present

## 2017-09-30 DIAGNOSIS — Z8249 Family history of ischemic heart disease and other diseases of the circulatory system: Secondary | ICD-10-CM | POA: Insufficient documentation

## 2017-09-30 DIAGNOSIS — I5022 Chronic systolic (congestive) heart failure: Secondary | ICD-10-CM | POA: Diagnosis present

## 2017-09-30 DIAGNOSIS — Z8673 Personal history of transient ischemic attack (TIA), and cerebral infarction without residual deficits: Secondary | ICD-10-CM | POA: Diagnosis not present

## 2017-09-30 DIAGNOSIS — J410 Simple chronic bronchitis: Secondary | ICD-10-CM

## 2017-09-30 DIAGNOSIS — I10 Essential (primary) hypertension: Secondary | ICD-10-CM

## 2017-09-30 DIAGNOSIS — I110 Hypertensive heart disease with heart failure: Secondary | ICD-10-CM | POA: Insufficient documentation

## 2017-09-30 DIAGNOSIS — J449 Chronic obstructive pulmonary disease, unspecified: Secondary | ICD-10-CM | POA: Diagnosis not present

## 2017-09-30 MED ORDER — SACUBITRIL-VALSARTAN 49-51 MG PO TABS: 1.0000 | ORAL_TABLET | Freq: Two times a day (BID) | ORAL | 5 refills | Status: DC

## 2017-09-30 MED ORDER — TORSEMIDE 20 MG PO TABS: 20.0000 mg | ORAL_TABLET | Freq: Two times a day (BID) | ORAL | 5 refills | Status: DC

## 2017-09-30 MED ORDER — POTASSIUM CHLORIDE ER 20 MEQ PO TBCR: 20.0000 meq | EXTENDED_RELEASE_TABLET | Freq: Two times a day (BID) | ORAL | 5 refills | Status: DC

## 2017-09-30 NOTE — Patient Instructions (Addendum)
Continue weighing daily and call for an overnight weight gain of > 2 pounds or a weekly weight gain of >5 pounds.  Stop furosemide (lasix) and begin torsemide (demadex) 20mg  twice daily.   Increase your potassium to 56meq twice daily.

## 2017-09-30 NOTE — Progress Notes (Signed)
Patient ID: Stephen [name], male    DOB: 08-19-1966, 51 y.o.   MRN: [id number]  HPI  Stephen [name] is a 51 y/o male with a history of TIA, HTN and HF with reduced ejection fraction who returns for a follow-up.   Last echo done 05/21/17 showed an EF of 20-25% which is stable from previous echo which was done 06/22/16 with an EF of 20%, mild MR, no aortic stenosis or regurgitation. Last saw his cardiologist in July 2017. Cardiac catheterization done 09/09/17 revealing normal coronary arteries.  Was in the ED 09/27/17 due to shortness of breath. Treated and released. Admitted 09/08/17 due to chest pain and HF exacerbation. Cardiology consult done. Cardiac catheterization done which revealed normal coronary arteries. CP musculoskeletal in origin. Chest CT negative for embolism. Discharged home after 2 days. Admitted 07/29/17 due to VT. Initially needed an amiodarone drip and then transitioned to oral amiodarone. Cardiac markers were negative X3. Discharged home after 2 days. Admitted 06/18/17 due to HF exacerbation. Cardiology consult obtained. Discharged home after 2 days. Went to Urgent Care 06/18/17 and was directed to the ED.  Admitted 05/20/17 due to HF exacerbation. Initially needed IV diuretics. Cardiology consult obtained. Discharged the next day. Was at Urgent Care on 03/22/17 due to bronchitis. Treated and released home.   Patient presents today for a follow-up visit with a chief complaint of minimal shortness of breath upon moderate exertion. He describes this as chronic in nature having been present for several years with varying levels of severity. He has associated fatigue, abdominal distention and difficulty sleeping. Denies any chest pain, edema in his legs, palpitations, dizziness or weight gain. Doesn't feel like the furosemide is working as well anymore, says that he's been on it for quite awhile  Past Medical History:  Diagnosis Date  . CHF (congestive heart failure) (HCC)    nonischemic  cardiomyopathy, EF 25%  . Hypertension   . TIA (transient ischemic attack) 06/21/16   Past Surgical History:  Procedure Laterality Date  . KNEE SURGERY Right   . LEFT HEART CATH AND CORONARY ANGIOGRAPHY N/A 09/09/2017   Procedure: LEFT HEART CATH AND CORONARY ANGIOGRAPHY;  Surgeon: Teodoro Spray, MD;  Location: [facility];  Service: Cardiovascular;  Laterality: N/A;  . VASECTOMY      Family History  Problem Relation Age of Onset  . Hypertension Mother   . Heart failure Mother   . Hypertension Father   . CAD Father   . Heart attack Father     Social History  Substance Use Topics  . Smoking status: Former Smoker    Packs/day: 0.25    Years: 20.00    Types: Cigarettes    Quit date: 02/20/2016  . Smokeless tobacco: Never Used  . Alcohol use 0.0 oz/week     Comment: beer occasional    Allergies  Allergen Reactions  . Lisinopril Cough   Prior to Admission medications   Medication Sig Start Date End Date Taking? Authorizing Provider  acetaminophen (TYLENOL) 500 MG tablet Take 1 capsule by mouth every 4 (four) hours as needed for fever.   Yes [provider]  albuterol (PROVENTIL HFA;VENTOLIN HFA) 108 (90 Base) MCG/ACT inhaler Inhale 2 puffs into the lungs every 4 (four) hours as needed for wheezing or shortness of breath. 03/22/17  Yes [name], MD  amiodarone (PACERONE) 400 MG tablet Take 1 tablet (400 mg total) by mouth 2 (two) times daily. 07/31/17  Yes [name], MD  atorvastatin (LIPITOR)  40 MG tablet Take 1 tablet (40 mg total) by mouth daily at 6 PM. Patient taking differently: Take 40 mg by mouth daily.  06/22/16  Yes [name], MD  Benzocaine (ANBESOL) 10 % LIQD Use as directed 1 application in the mouth or throat 4 (four) times daily as needed (pain).    Yes [provider]  Camphor-Eucalyptus-Menthol (CHEST RUB) 4.8-1.2-2.6 % OINT Apply 1 application topically daily as needed (congestino).    Yes [provider]   carvedilol (COREG) 6.25 MG tablet Take 3.125 mg by mouth 2 (two) times daily.  06/20/16  Yes [provider]  clopidogrel (PLAVIX) 75 MG tablet Take 1 tablet (75 mg total) by mouth daily. 06/22/16  Yes [name], MD  cyclobenzaprine (FLEXERIL) 10 MG tablet Take 1 tablet (10 mg total) by mouth 3 (three) times daily as needed for muscle spasms. 09/10/17  Yes [name], [name], MD  diclofenac sodium (VOLTAREN) 1 % GEL Apply 2 g topically 4 (four) times daily as needed (pain).    Yes [provider]  docusate sodium (COLACE) 100 MG capsule Take 1 capsule (100 mg total) by mouth 2 (two) times daily as needed for mild constipation. 09/10/17  Yes [name], Aruna, MD  fluticasone (FLONASE) 50 MCG/ACT nasal spray Place 2 sprays into both nostrils daily. Patient taking differently: Place 2 sprays into both nostrils daily as needed for allergies.  06/20/17  Yes [name], MD  Fluticasone-Salmeterol (ADVAIR DISKUS) 250-50 MCG/DOSE AEPB Inhale 1 puff into the lungs 2 (two) times daily. 06/20/17 06/20/18 Yes [name], MD  furosemide (LASIX) 20 MG tablet Take 1 tablet (20 mg total) by mouth 2 (two) times daily. 09/28/17  Yes Kirichenko, Tatyana, PA-C  Multiple Vitamin (MULTIVITAMIN WITH MINERALS) TABS tablet Take 1 tablet by mouth daily.   Yes [provider]  nitroGLYCERIN (NITROSTAT) 0.4 MG SL tablet Place 1 tablet (0.4 mg total) under the tongue every 5 (five) minutes as needed for chest pain. 07/31/17  Yes [name], MD  pantoprazole (PROTONIX) 40 MG tablet Take 1 tablet (40 mg total) by mouth daily. 06/20/17  Yes [name], MD  polyethylene glycol (MIRALAX / GLYCOLAX) packet Take 17 g by mouth daily. Patient taking differently: Take 17 g by mouth daily as needed for mild constipation.  09/11/17  Yes [name], [name], MD  potassium chloride 20 MEQ TBCR Take 20 mEq by mouth daily. 05/21/17  Yes [name], MD  sacubitril-valsartan (ENTRESTO) 49-51 MG Take 1 tablet by mouth  2 (two) times daily.  09/25/16  Yes Hackney, Otila Kluver A, FNP  traMADol (ULTRAM) 50 MG tablet Take 1 tablet (50 mg total) by mouth every 6 (six) hours as needed. Patient taking differently: Take 50 mg by mouth every 6 (six) hours as needed for moderate pain.  09/10/17 09/10/18 Yes [name], [name], MD    Review of Systems  Constitutional: Positive for fatigue. Negative for appetite change.  HENT: Negative for congestion, postnasal drip and sore throat.   Eyes: Negative.   Respiratory: Positive for shortness of breath (yesterday). Negative for cough and chest tightness.   Cardiovascular: Negative for chest pain, palpitations and leg swelling.  Gastrointestinal: Positive for abdominal distention. Negative for abdominal pain and constipation.  Endocrine: Negative.   Genitourinary: Negative.   Musculoskeletal: Positive for back pain. Negative for neck pain.  Skin: Negative.   Allergic/Immunologic: Negative.   Neurological: Negative for dizziness and light-headedness.  Hematological: Negative for adenopathy. Does not bruise/bleed easily.  Psychiatric/Behavioral: Positive for sleep disturbance (wake  up feeling tired; sleeping on 1 pillow). Negative for dysphoric mood. The patient is nervous/anxious (at times ).    Vitals:   09/30/17 1632  BP: (!) 124/94  Pulse: 97  Resp: 18  SpO2: 99%  Weight: 237 lb 6 oz (107.7 kg)  Height: 6\' 2"  (1.88 m)   Wt Readings from Last 3 Encounters:  09/30/17 237 lb 6 oz (107.7 kg)  09/28/17 236 lb 14.4 oz (107.5 kg)  09/10/17 238 lb 11.2 oz (108.3 kg)    Lab Results  Component Value Date   CREATININE 1.48 (H) 09/27/2017   CREATININE 1.28 (H) 09/10/2017   CREATININE 1.21 09/09/2017    Physical Exam  Constitutional: He is oriented to person, place, and time. He appears well-developed and well-nourished.  HENT:  Head: Normocephalic and atraumatic.  Neck: Normal range of motion. Neck supple.  Cardiovascular: Normal rate and regular rhythm.   Pulmonary/Chest:  Effort normal. He has no wheezes. He has rales in the left upper field and the left lower field.  Abdominal: Soft. He exhibits distension. There is no tenderness.  Musculoskeletal: He exhibits no edema or tenderness.  Neurological: He is alert and oriented to person, place, and time.  Skin: Skin is warm and dry.  Psychiatric: He has a normal mood and affect. His behavior is normal. Thought content normal.  Nursing note and vitals reviewed.    Assessment & Plan:  1: Chronic heart failure with reduced ejection fraction-  - NYHA Class II - euvolemic - Continue daily weights & call for overnight weight gain of >2 pounds or a weekly weight gain of >5 pounds; weight down 2 pounds since he was last here - not adding salt but does BBQ on a large cooker and does taste what he's cooking and does use salt when he's cooking. Discussed the importance of closely following a 2000mg  sodium diet; he estimates that he's getting 800-900mg  sodium daily - saw cardiologist Clayborn Bigness) 08/07/17 and returns in 6 months - having a 2nd opinion with Dr. Fletcher Anon on 10/08/17 - does elevate legs which helps reduce edema and then it returns after standing all day at work - going to get some compression socks to wear; instructed to put them on in the morning and remove them at bedtime - BNP on 09/27/17 reviewed and was elevated at 2168.9 - will stop his furosemide and change to torsemide 20mg  twice daily - also instructed to increase his potassium to 15meq twice daily since most recent potassium level was 3.3 - 28 samples of entresto 49/51mg  given to patient (Lot [id number] 09/2017)  2: HTN-  - BP looks ok today; changing diuretic per above - BMP done 09/27/17 reviewed and shows sodium 135, potassium 3.3 and GFR >60  3: COPD- - using inhalers - PFT's done 08/01/17 - saw pulmonologist ([place]) 07/25/17 and returns 10/21/17  Reviewed medication bottles with the patient.  Return in 3 days for a recheck and to drawn  BMP/BNP

## 2017-10-03 ENCOUNTER — Other Ambulatory Visit
Admission: RE | Admit: 2017-10-03 | Discharge: 2017-10-03 | Disposition: A | Discharge status: Home / Self Care | Payer: Commercial Managed Care - PPO | Source: Ambulatory Visit | Attending: Family | Admitting: Family

## 2017-10-03 ENCOUNTER — Encounter: Payer: Self-pay | Admitting: Family

## 2017-10-03 ENCOUNTER — Ambulatory Visit: Payer: Commercial Managed Care - PPO | Attending: Family | Admitting: Family

## 2017-10-03 VITALS — BP 112/85 | HR 86 | Resp 20 | Ht 74.0 in | Wt 237.1 lb

## 2017-10-03 DIAGNOSIS — Z8249 Family history of ischemic heart disease and other diseases of the circulatory system: Secondary | ICD-10-CM | POA: Diagnosis not present

## 2017-10-03 DIAGNOSIS — I429 Cardiomyopathy, unspecified: Secondary | ICD-10-CM | POA: Insufficient documentation

## 2017-10-03 DIAGNOSIS — J449 Chronic obstructive pulmonary disease, unspecified: Secondary | ICD-10-CM | POA: Diagnosis not present

## 2017-10-03 DIAGNOSIS — I5022 Chronic systolic (congestive) heart failure: Secondary | ICD-10-CM | POA: Diagnosis present

## 2017-10-03 DIAGNOSIS — I10 Essential (primary) hypertension: Secondary | ICD-10-CM

## 2017-10-03 DIAGNOSIS — Z87891 Personal history of nicotine dependence: Secondary | ICD-10-CM | POA: Diagnosis not present

## 2017-10-03 DIAGNOSIS — I110 Hypertensive heart disease with heart failure: Secondary | ICD-10-CM | POA: Insufficient documentation

## 2017-10-03 DIAGNOSIS — Z8673 Personal history of transient ischemic attack (TIA), and cerebral infarction without residual deficits: Secondary | ICD-10-CM | POA: Insufficient documentation

## 2017-10-03 DIAGNOSIS — J410 Simple chronic bronchitis: Secondary | ICD-10-CM

## 2017-10-03 LAB — BASIC METABOLIC PANEL
Anion gap: 10 (ref 5–15)
BUN: 22 mg/dL — AB (ref 6–20)
CALCIUM: 9.2 mg/dL (ref 8.9–10.3)
CHLORIDE: 105 mmol/L (ref 101–111)
CO2: 25 mmol/L (ref 22–32)
CREATININE: 1.53 mg/dL — AB (ref 0.61–1.24)
GFR calc non Af Amer: 51 mL/min — ABNORMAL LOW (ref >60)
GFR, EST AFRICAN AMERICAN: 59 mL/min — AB (ref >60)
Glucose, Bld: 106 mg/dL — ABNORMAL HIGH (ref 65–99)
Potassium: 4 mmol/L (ref 3.5–5.1)
SODIUM: 140 mmol/L (ref 135–145)

## 2017-10-03 LAB — BRAIN NATRIURETIC PEPTIDE: B NATRIURETIC PEPTIDE 5: 1115 pg/mL — AB (ref 0.0–100.0)

## 2017-10-03 NOTE — Patient Instructions (Signed)
Continue weighing daily and call for an overnight weight gain of > 2 pounds or a weekly weight gain of >5 pounds. 

## 2017-10-03 NOTE — Progress Notes (Signed)
Patient ID: Stephen [name], male    DOB: 14-Jan-1966, 51 y.o.   MRN: [id number]  HPI  Mr. Felten is a 51 y/o male with a history of TIA, HTN and HF with reduced ejection fraction who returns for a follow-up.   Last echo done 05/21/17 showed an EF of 20-25% which is stable from previous echo which was done 06/22/16 with an EF of 20%, mild MR, no aortic stenosis or regurgitation. Last saw his cardiologist in July 2017. Cardiac catheterization done 09/09/17 revealing normal coronary arteries.  Was in the ED 09/27/17 due to shortness of breath. Treated and released. Admitted 09/08/17 due to chest pain and HF exacerbation. Cardiology consult done. Cardiac catheterization done which revealed normal coronary arteries. CP musculoskeletal in origin. Chest CT negative for embolism. Discharged home after 2 days. Admitted 07/29/17 due to VT. Initially needed an amiodarone drip and then transitioned to oral amiodarone. Cardiac markers were negative X3. Discharged home after 2 days. Admitted 06/18/17 due to HF exacerbation. Cardiology consult obtained. Discharged home after 2 days. Went to Urgent Care 06/18/17 and was directed to the ED.  Admitted 05/20/17 due to HF exacerbation. Initially needed IV diuretics. Cardiology consult obtained. Discharged the next day. Was at Urgent Care on 03/22/17 due to bronchitis. Treated and released home.   Patient presents today for a follow-up visit with a chief complaint of   Past Medical History:  Diagnosis Date  . CHF (congestive heart failure) (HCC)    nonischemic cardiomyopathy, EF 25%  . Hypertension   . TIA (transient ischemic attack) 06/21/16   Past Surgical History:  Procedure Laterality Date  . KNEE SURGERY Right   . LEFT HEART CATH AND CORONARY ANGIOGRAPHY N/A 09/09/2017   Procedure: LEFT HEART CATH AND CORONARY ANGIOGRAPHY;  Surgeon: Teodoro Spray, MD;  Location: [facility];  Service: Cardiovascular;  Laterality: N/A;  . VASECTOMY      Family History   Problem Relation Age of Onset  . Hypertension Mother   . Heart failure Mother   . Hypertension Father   . CAD Father   . Heart attack Father     Social History  Substance Use Topics  . Smoking status: Former Smoker    Packs/day: 0.25    Years: 20.00    Types: Cigarettes    Quit date: 02/20/2016  . Smokeless tobacco: Never Used  . Alcohol use 0.0 oz/week     Comment: beer occasional    Allergies  Allergen Reactions  . Lisinopril Cough     Review of Systems  Constitutional: Positive for fatigue. Negative for appetite change.  HENT: Negative for congestion, postnasal drip and sore throat.   Eyes: Negative.   Respiratory: Negative for cough, chest tightness, shortness of breath and wheezing.   Cardiovascular: Positive for leg swelling (at the end of the work day). Negative for chest pain and palpitations.  Gastrointestinal: Negative for abdominal distention, abdominal pain and constipation.  Endocrine: Negative.   Genitourinary: Negative.   Musculoskeletal: Positive for back pain. Negative for neck pain.  Skin: Negative.   Allergic/Immunologic: Negative.   Neurological: Negative for dizziness and light-headedness.  Hematological: Negative for adenopathy. Does not bruise/bleed easily.  Psychiatric/Behavioral: Positive for sleep disturbance (wake up feeling tired; sleeping on 1 pillow). Negative for dysphoric mood. The patient is nervous/anxious (at times ).    Vitals:   10/03/17 1628  BP: 112/85  Pulse: 86  Resp: 20  SpO2: 100%  Weight: 237 lb 2 oz (107.6 kg)  Height: 6\' 2"  (1.88 m)   Wt Readings from Last 3 Encounters:  10/03/17 237 lb 2 oz (107.6 kg)  09/30/17 237 lb 6 oz (107.7 kg)  09/28/17 236 lb 14.4 oz (107.5 kg)    Lab Results  Component Value Date   CREATININE 1.48 (H) 09/27/2017   CREATININE 1.28 (H) 09/10/2017   CREATININE 1.21 09/09/2017    Physical Exam  Constitutional: He is oriented to person, place, and time. He appears well-developed and  well-nourished.  HENT:  Head: Normocephalic and atraumatic.  Neck: Normal range of motion. Neck supple.  Cardiovascular: Normal rate and regular rhythm.   Pulmonary/Chest: Effort normal. He has no wheezes. He has no rales.  Abdominal: Soft. He exhibits no distension. There is no tenderness.  Musculoskeletal: He exhibits edema (1+ pitting edema above sock line bilaterally). He exhibits no tenderness.  Neurological: He is alert and oriented to person, place, and time.  Skin: Skin is warm and dry.  Psychiatric: He has a normal mood and affect. His behavior is normal. Thought content normal.  Nursing note and vitals reviewed.    Assessment & Plan:  1: Chronic heart failure with reduced ejection fraction-  - NYHA Class II - euvolemic - Continue daily weights & call for overnight weight gain of >2 pounds or a weekly weight gain of >5 pounds; weight stable since he was last here - not adding salt but does BBQ on a large cooker and does taste what he's cooking and does use salt when he's cooking. Discussed the importance of closely following a 2000mg  sodium diet; he estimates that he's getting 800-900mg  sodium daily - saw cardiologist Clayborn Bigness) 08/07/17 and returns in 6 months - having a 2nd opinion with Dr. Fletcher Anon on 10/08/17 - does elevate legs which helps reduce edema and then it returns after standing all day at work - going to get some compression socks to wear; instructed to put them on in the morning and remove them at bedtime - BNP on 09/27/17 reviewed and was elevated at 2168.9 - changed to torsemide 20mg  twice daily at last visit & rales have resolved - BMP & BNP drawn today  2: HTN-  - BP looks good today - BMP done 09/27/17 reviewed and shows sodium 135, potassium 3.3 and GFR >60  3: COPD- - using inhalers - PFT's done 08/01/17 - saw pulmonologist ([place]) 07/25/17 and returns 10/21/17  Reviewed medication bottles with the patient.  Return in 2 months or sooner for any  questions/problems before then.

## 2017-10-04 ENCOUNTER — Other Ambulatory Visit: Payer: Self-pay

## 2017-10-04 ENCOUNTER — Telehealth: Payer: Self-pay

## 2017-10-04 DIAGNOSIS — I5023 Acute on chronic systolic (congestive) heart failure: Secondary | ICD-10-CM

## 2017-10-04 NOTE — Telephone Encounter (Signed)
Called and left message in regards to recent blood work.   Advised that lab work showed improvement to BNP and potassium levels are now stable.  His kidney levels were slightly worse with medication changes, we have sent a message to Alysia Penna RN with [facility] to recheck labs next week. Asked patient to call the office if he has any questions or concerns.

## 2017-10-08 ENCOUNTER — Encounter: Payer: Self-pay | Admitting: Cardiovascular Disease

## 2017-10-08 ENCOUNTER — Other Ambulatory Visit
Admission: RE | Admit: 2017-10-08 | Discharge: 2017-10-08 | Disposition: A | Discharge status: Home / Self Care | Payer: Commercial Managed Care - PPO | Source: Ambulatory Visit | Attending: Family | Admitting: Family

## 2017-10-08 ENCOUNTER — Ambulatory Visit (INDEPENDENT_AMBULATORY_CARE_PROVIDER_SITE_OTHER): Payer: Commercial Managed Care - PPO | Admitting: Cardiovascular Disease

## 2017-10-08 VITALS — BP 120/80 | HR 101 | Ht 73.5 in | Wt 235.0 lb

## 2017-10-08 DIAGNOSIS — I5023 Acute on chronic systolic (congestive) heart failure: Secondary | ICD-10-CM

## 2017-10-08 DIAGNOSIS — I10 Essential (primary) hypertension: Secondary | ICD-10-CM

## 2017-10-08 DIAGNOSIS — I472 Ventricular tachycardia: Secondary | ICD-10-CM | POA: Diagnosis not present

## 2017-10-08 DIAGNOSIS — I4729 Other ventricular tachycardia: Secondary | ICD-10-CM

## 2017-10-08 LAB — BASIC METABOLIC PANEL
ANION GAP: 10 (ref 5–15)
BUN: 28 mg/dL — ABNORMAL HIGH (ref 6–20)
CALCIUM: 9.3 mg/dL (ref 8.9–10.3)
CO2: 25 mmol/L (ref 22–32)
Chloride: 105 mmol/L (ref 101–111)
Creatinine, Ser: 1.84 mg/dL — ABNORMAL HIGH (ref 0.61–1.24)
GFR, EST AFRICAN AMERICAN: 47 mL/min — AB (ref >60)
GFR, EST NON AFRICAN AMERICAN: 41 mL/min — AB (ref >60)
GLUCOSE: 109 mg/dL — AB (ref 65–99)
POTASSIUM: 4.3 mmol/L (ref 3.5–5.1)
Sodium: 140 mmol/L (ref 135–145)

## 2017-10-08 LAB — BRAIN NATRIURETIC PEPTIDE: B NATRIURETIC PEPTIDE 5: 1259 pg/mL — AB (ref 0.0–100.0)

## 2017-10-08 MED ORDER — SPIRONOLACTONE 25 MG PO TABS: 25.0000 mg | ORAL_TABLET | Freq: Every day | ORAL | 3 refills | Status: DC

## 2017-10-08 MED ORDER — IVABRADINE HCL 5 MG PO TABS: 5.0000 mg | ORAL_TABLET | Freq: Two times a day (BID) | ORAL | 3 refills | Status: DC

## 2017-10-08 NOTE — Patient Instructions (Addendum)
Medication Instructions:  Your physician has recommended you make the following change in your medication:  STOP taking aspirin STOP taking potassium START taking spironolactone 25mg  once daily START taking corlanor 5mg  twice daily   Labwork: BMET in one week at the [facility] lab. No appointment needed  Testing/Procedures: Your physician has requested that you have an echocardiogram in 2 weeks. Echocardiography is a painless test that uses sound waves to create images of your heart. It provides your doctor with information about the size and shape of your heart and how well your heart's chambers and valves are working. This procedure takes approximately one hour. There are no restrictions for this procedure.    Follow-Up: Your physician recommends that you schedule a follow-up appointment in one month with Dr. Fletcher Anon.    Any Other Special Instructions Will Be Listed Below (If Applicable).     If you need a refill on your cardiac medications before your next appointment, please call your pharmacy.  Echocardiogram An echocardiogram, or echocardiography, uses sound waves (ultrasound) to produce an image of your heart. The echocardiogram is simple, painless, obtained within a short period of time, and offers valuable information to your health care provider. The images from an echocardiogram can provide information such as:  Evidence of coronary artery disease (CAD).  Heart size.  Heart muscle function.  Heart valve function.  Aneurysm detection.  Evidence of a past heart attack.  Fluid buildup around the heart.  Heart muscle thickening.  Assess heart valve function.  Tell a health care provider about:  Any allergies you have.  All medicines you are taking, including vitamins, herbs, eye drops, creams, and over-the-counter medicines.  Any problems you or family members have had with anesthetic medicines.  Any blood disorders you have.  Any surgeries you have  had.  Any medical conditions you have.  Whether you are pregnant or may be pregnant. What happens before the procedure? No special preparation is needed. Eat and drink normally. What happens during the procedure?  In order to produce an image of your heart, gel will be applied to your chest and a wand-like tool (transducer) will be moved over your chest. The gel will help transmit the sound waves from the transducer. The sound waves will harmlessly bounce off your heart to allow the heart images to be captured in real-time motion. These images will then be recorded.  You may need an IV to receive a medicine that improves the quality of the pictures. What happens after the procedure? You may return to your normal schedule including diet, activities, and medicines, unless your health care provider tells you otherwise. This information is not intended to replace advice given to you by your health care provider. Make sure you discuss any questions you have with your health care provider. Document Released: 12/14/2000 Document Revised: 08/04/2016 Document Reviewed: 08/24/2013 Elsevier Interactive Patient Education  2017 [place]. Echocardiogram An echocardiogram, or echocardiography, uses sound waves (ultrasound) to produce an image of your heart. The echocardiogram is simple, painless, obtained within a short period of time, and offers valuable information to your health care provider. The images from an echocardiogram can provide information such as:  Evidence of coronary artery disease (CAD).  Heart size.  Heart muscle function.  Heart valve function.  Aneurysm detection.  Evidence of a past heart attack.  Fluid buildup around the heart.  Heart muscle thickening.  Assess heart valve function.  Tell a health care provider about:  Any allergies you have.  All medicines you are taking, including vitamins, herbs, eye drops, creams, and over-the-counter medicines.  Any  problems you or family members have had with anesthetic medicines.  Any blood disorders you have.  Any surgeries you have had.  Any medical conditions you have.  Whether you are pregnant or may be pregnant. What happens before the procedure? No special preparation is needed. Eat and drink normally. What happens during the procedure?  In order to produce an image of your heart, gel will be applied to your chest and a wand-like tool (transducer) will be moved over your chest. The gel will help transmit the sound waves from the transducer. The sound waves will harmlessly bounce off your heart to allow the heart images to be captured in real-time motion. These images will then be recorded.  You may need an IV to receive a medicine that improves the quality of the pictures. What happens after the procedure? You may return to your normal schedule including diet, activities, and medicines, unless your health care provider tells you otherwise. This information is not intended to replace advice given to you by your health care provider. Make sure you discuss any questions you have with your health care provider. Document Released: 12/14/2000 Document Revised: 08/04/2016 Document Reviewed: 08/24/2013 Elsevier Interactive Patient Education  2017 [employer] Ivabradine tablets What is this medicine? IVABRADINE (eye VAB ra deen) is used to reduce the risk of going to the hospital due to worsening heart failure. This medicine may be used for other purposes; ask your health care provider or pharmacist if you have questions. COMMON BRAND NAME(S): Corlanor What should I tell my health care provider before I take this medicine? They need to know if you have any of these conditions: -certain heart conditions like sick sinus syndrome, sinoatrial block, or third degree atrioventricular block -heart failure that has recently worsened -liver disease -low blood pressure (less than 90/50 mmHg) -low resting  heart rate (less than 60 beats per minute) -pacemaker -an unusual or allergic reaction to ivabradine, other medicines, foods, dyes, or preservatives -pregnant or trying to get pregnant -breast-feeding How should I use this medicine? Take this medicine by mouth with a glass of water. Follow the directions on the prescription label. Avoid grapefruit juice. Take your medicine at regular intervals. Do not take it more often than directed. Do not stop taking except on your doctor's advice. Talk to your pediatrician regarding the use of this medicine in children. Special care may be needed. Overdosage: If you think you have taken too much of this medicine contact a poison control center or emergency room at once. NOTE: This medicine is only for you. Do not share this medicine with others. What if I miss a dose? If you miss a dose, take it as soon as you can. If it is almost time for your next dose, take only that dose. Do not take double or extra doses. What may interact with this medicine? Do not take this medicine with any of the following medications: -certain medicines for fungal infections like ketoconazole and itraconazole -certain antibiotics called macrolide antibiotics like clarithromycin and telithromycin -certain medicines for HIV disease called protease inhibitors like nelfinavir -nefazodone This medicine may also interact with the following medications: -amiodarone -beta-blockers like metoprolol and propranolol -calcium channel blockers like diltiazem and verapamil -certain medicines for seizures like barbiturates and phenytoin -digoxin -grapefruit juice -rifampicin -St. John's wort This list may not describe all possible interactions. Give your health care provider a list of  all the medicines, herbs, non-prescription drugs, or dietary supplements you use. Also tell them if you smoke, drink alcohol, or use illegal drugs. Some items may interact with your medicine. What should I  watch for while using this medicine? Tell your doctor or health care professional if your symptoms do not start to get better or if they get worse. You may experience changes in vision. Use caution if you are driving or using machinery when sudden changes in light intensity may occur, especially while driving at night. This effect may decrease after using this medicine for a long time. Do not become pregnant while taking this medicine. Women who are able to get pregnant must use birth control while taking this medicine. Women should inform their doctor if they wish to become pregnant or think they might be pregnant. There is a potential for serious side effects to an unborn child. Talk to your health care professional or pharmacist for more information. What side effects may I notice from receiving this medicine? Side effects that you should report to your doctor or health care professional as soon as possible: -allergic reactions like skin rash, itching or hives, swelling of the face, lips, or tongue -high blood pressure -signs and symptoms of a dangerous change in heartbeat or heart rhythm like chest pain; dizziness; fast or irregular heartbeat; palpitations; feeling faint or lightheaded; breathing problems -unusually slow heartbeat Side effects that usually do not require medical attention (report to your doctor or health care professional if they continue or are bothersome): -changes in vision This list may not describe all possible side effects. Call your doctor for medical advice about side effects. You may report side effects to FDA at 1-800-FDA-1088. Where should I keep my medicine? Keep out of the reach of children. Store at room temperature between 20 and 25 degrees C (68 and 77 degrees F). Throw away any unused medicine after the expiration date. NOTE: This sheet is a summary. It may not cover all possible information. If you have questions about this medicine, talk to your doctor,  pharmacist, or health care provider.  2018 Elsevier/Gold Standard (2016-10-12 10:52:34)

## 2017-10-08 NOTE — Progress Notes (Signed)
Cardiology Office Note   Date:  10/08/2017   ID:  Stephen [name], Stephen [name] 06-16-1966, MRN [id number]  PCP:  [name], MD  Cardiologist:   Kathlyn Sacramento, MD   Chief Complaint  Patient presents with  . Other    Former [name] pt for a second opinion. Meds reviewed by the pt's bottles. Pt. c/o shortness of breath with LE edema in the evenings.       History of Present Illness: Stephen [name] is a 51 y.o. male who presents for a second opinion regarding management of Chronic systolic heart failure. He is followed normally by [name].  The patient has prolonged history of hypertension and was hospitalized in June 2017 with acute systolic heart failure with ejection fraction of 20% by echocardiogram. He was treated medically. He ultimately underwent cardiac catheterization in September of this year which showed normal coronary arteries. LVEDP was 31 mmHg. These images were personally reviewed by me. He had TIA in 2017 and has been on Plavix since then. He quit smoking about 2 years ago and denies any recreational drug use. He works at [employer] and cooks for fun.  In spite of medical therapy for heart failure, he continues to be very limited with significant exertional dyspnea with regular activities. He frequently has leg edema and orthopnea. He feels fatigued. He has no chest pain. He continues to work full-time. He was diagnosed with nonsustained ventricular tachycardia and was treated with amiodarone. However, he discontinued the medication about 3 weeks ago due to constipation. She does have chronic kidney disease with creatinine around 1.4.    Past Medical History:  Diagnosis Date  . CHF (congestive heart failure) (HCC)    nonischemic cardiomyopathy, EF 25%  . Hypertension   . TIA (transient ischemic attack) 06/21/16    Past Surgical History:  Procedure Laterality Date  . KNEE SURGERY Right   . LEFT HEART CATH AND CORONARY ANGIOGRAPHY N/A 09/09/2017   Procedure: LEFT HEART CATH AND CORONARY ANGIOGRAPHY;  Surgeon: Teodoro Spray, MD;  Location: [facility];  Service: Cardiovascular;  Laterality: N/A;  . VASECTOMY       Current Outpatient Prescriptions  Medication Sig Dispense Refill  . acetaminophen (TYLENOL) 500 MG tablet Take 1 capsule by mouth every 4 (four) hours as needed for fever.    Marland Kitchen albuterol (PROVENTIL HFA;VENTOLIN HFA) 108 (90 Base) MCG/ACT inhaler Inhale 2 puffs into the lungs every 4 (four) hours as needed for wheezing or shortness of breath. 1 Inhaler 0  . aspirin 81 MG tablet Take 81 mg by mouth daily.    Marland Kitchen atorvastatin (LIPITOR) 40 MG tablet Take 1 tablet (40 mg total) by mouth daily at 6 PM. (Patient taking differently: Take 40 mg by mouth daily. ) 30 tablet 2  . Benzocaine (ANBESOL) 10 % LIQD Use as directed 1 application in the mouth or throat 4 (four) times daily as needed (pain).     . Camphor-Eucalyptus-Menthol (CHEST RUB) 4.8-1.2-2.6 % OINT Apply 1 application topically daily as needed (congestino).     . carvedilol (COREG) 6.25 MG tablet Take 3.125 mg by mouth 2 (two) times daily.     . clopidogrel (PLAVIX) 75 MG tablet Take 1 tablet (75 mg total) by mouth daily. 30 tablet 2  . cyclobenzaprine (FLEXERIL) 10 MG tablet Take 1 tablet (10 mg total) by mouth 3 (three) times daily as needed for muscle spasms. 20 tablet 0  . diclofenac sodium (VOLTAREN) 1 % GEL Apply  2 g topically 4 (four) times daily as needed (pain).     Marland Kitchen docusate sodium (COLACE) 100 MG capsule Take 1 capsule (100 mg total) by mouth 2 (two) times daily as needed for mild constipation. 10 capsule 0  . fluticasone (FLONASE) 50 MCG/ACT nasal spray Place 2 sprays into both nostrils daily. (Patient taking differently: Place 2 sprays into both nostrils daily as needed for allergies. ) 16 g 2  . Fluticasone-Salmeterol (ADVAIR DISKUS) 250-50 MCG/DOSE AEPB Inhale 1 puff into the lungs 2 (two) times daily. 1 each 0  . Multiple Vitamin (MULTIVITAMIN WITH  MINERALS) TABS tablet Take 1 tablet by mouth daily.    . nitroGLYCERIN (NITROSTAT) 0.4 MG SL tablet Place 1 tablet (0.4 mg total) under the tongue every 5 (five) minutes as needed for chest pain. 30 tablet 0  . pantoprazole (PROTONIX) 40 MG tablet Take 1 tablet (40 mg total) by mouth daily. 30 tablet 0  . polyethylene glycol (MIRALAX / GLYCOLAX) packet Take 17 g by mouth daily. (Patient taking differently: Take 17 g by mouth daily as needed for mild constipation. ) 14 each 0  . Potassium Chloride ER 20 MEQ TBCR Take 20 mEq by mouth 2 (two) times daily. 60 tablet 5  . sacubitril-valsartan (ENTRESTO) 49-51 MG Take 1 tablet by mouth 2 (two) times daily. 60 tablet 5  . torsemide (DEMADEX) 20 MG tablet Take 1 tablet (20 mg total) by mouth 2 (two) times daily. 60 tablet 5  . traMADol (ULTRAM) 50 MG tablet Take 1 tablet (50 mg total) by mouth every 6 (six) hours as needed. (Patient taking differently: Take 50 mg by mouth every 6 (six) hours as needed for moderate pain. ) 20 tablet 0  . amiodarone (PACERONE) 400 MG tablet Take 1 tablet (400 mg total) by mouth 2 (two) times daily. (Patient not taking: Reported on 10/08/2017) 30 tablet 0   No current facility-administered medications for this visit.     Allergies:   Lisinopril    Social History:  The patient  reports that he quit smoking about 19 months ago. His smoking use included Cigarettes. He has a 5.00 pack-year smoking history. He has never used smokeless tobacco. He reports that he drinks alcohol. He reports that he does not use drugs.   Family History:  The patient's family history includes CAD in his father; Heart attack in his father; Heart failure in his mother; Hypertension in his father and mother.    ROS:  Please see the history of present illness.   Otherwise, review of systems are positive for none.   All other systems are reviewed and negative.    PHYSICAL EXAM: VS:  BP 120/80 (BP Location: Right Arm, Patient Position: Sitting, Cuff  Size: Normal)   Pulse (!) 101   Ht 6' 1.5" (1.867 m)   Wt 235 lb (106.6 kg)   BMI 30.58 kg/m  , BMI Body mass index is 30.58 kg/m. GEN: Well nourished, well developed, in no acute distress  HEENT: normal  Neck: no  carotid bruits, or masses. Mild JVD Cardiac: RRR with frequent premature beats; no murmurs, rubs, or gallops, mild bilateral leg edema  Respiratory:  clear to auscultation bilaterally, normal work of breathing GI: soft, nontender, nondistended, + BS MS: no deformity or atrophy  Skin: warm and dry, no rash Neuro:  Strength and sensation are intact Psych: euthymic mood, full affect   EKG:  EKG is ordered today. The ekg ordered today demonstrates sinus tachycardia with first-degree AV  block and frequent PVCs. LVH with repolarization abnormalities.Recent Labs: 06/18/2017: ALT 26 09/08/2017: TSH 1.539 09/10/2017: Magnesium 1.9 09/27/2017: Hemoglobin 14.6; Platelets 245 10/03/2017: B Natriuretic Peptide 1,115.0; BUN 22; Creatinine, Ser 1.53; Potassium 4.0; Sodium 140    Lipid Panel    Component Value Date/Time   CHOL 173 06/22/2016 0755   TRIG 65 06/22/2016 0755   HDL 36 (L) 06/22/2016 0755   CHOLHDL 4.8 06/22/2016 0755   VLDL 13 06/22/2016 0755   LDLCALC 124 (H) 06/22/2016 0755      Wt Readings from Last 3 Encounters:  10/08/17 235 lb (106.6 kg)  10/03/17 237 lb 2 oz (107.6 kg)  09/30/17 237 lb 6 oz (107.7 kg)      Other studies Reviewed: Additional studies/ records that were reviewed today include: I reviewed his previous cardiac catheterization and echocardiogram.. I reviewed all his previous cardiac records and notes. At least 30 minutes was spent on reviewing his records. Review of the above records demoutlined above  PAD Screen 10/08/2017  Previous PAD dx? No  Previous surgical procedure? No  Pain with walking? Yes  Subsides with rest? Yes  Feet/toe relief with dangling? No  Painful, non-healing ulcers? No  Extremities discolored? No      ASSESSMENT  AND PLAN:  1.  Chronic systolic heart failure with severely reduced LV systolic function due to nonischemic cardiomyopathy. I reviewed his most recent echocardiogram in May which in my opinion showed an EF of 15% at most. There was moderate mitral regurgitation. It is concerning that the patient continues to be in [place] class III in spite of current medications. Given resting tachycardia, he might benefit from treatment with Ivabradine. I started this at 5 mg twice daily. Continue treatment with carvedilol and Entresto.  I elected to add spironolactone 25 mg twice daily and discontinue potassium. Check basic metabolic profile today and next week. I'm going to repeat his echocardiogram in about 2 weeks to evaluate his ejection fraction. The patient most likely will require an ICD placement for primary prevention of sudden death. However, I'm concerned that his QRS is not wide enough for him to benefit from CRT. Thus, he might require more than that such as evaluation for left ventricular assist device or cardiac transplantation. A cardiopulmonary stress testing might be helpful after optimizing his medications.  2. Nonsustained ventricular tachycardia: Frequent PVCs might be affecting his ejection fraction. Unfortunately, it appears that he did not tolerate amiodarone due to GI symptoms and constipation. I wil consider a 24-hour Holter monitor and referral to [name].     Disposition:   FU with me in 1 month  Signed,  Kathlyn Sacramento, MD  10/08/2017 3:54 PM    [place]

## 2017-10-10 ENCOUNTER — Telehealth: Payer: Self-pay | Admitting: Family

## 2017-10-10 NOTE — Telephone Encounter (Signed)
LM on patient's voicemail regarding lab work that was drawn 10/08/17. Potassium level is normal, BNP remains elevated and renal function has declined some. Has had recent medication changes by cardiology with a plan to recheck his labs in one week along with updating his echocardiogram. Patient to call back for further details regarding lab results.

## 2017-10-15 ENCOUNTER — Ambulatory Visit: Payer: Commercial Managed Care - PPO

## 2017-10-21 ENCOUNTER — Ambulatory Visit: Payer: Commercial Managed Care - PPO | Admitting: Internal Medicine

## 2017-10-22 ENCOUNTER — Telehealth: Payer: Self-pay | Admitting: Family

## 2017-10-22 MED ORDER — FUROSEMIDE 40 MG PO TABS: 40.0000 mg | ORAL_TABLET | Freq: Two times a day (BID) | ORAL | 5 refills | Status: DC

## 2017-10-22 NOTE — Telephone Encounter (Signed)
Patient called to say that the torsemide that he's been taking doesn't seem to be working as well as the furosemide did and he would like to switch back to furosemide. Says that his weight is up to 238 pounds and he's feeling a little more short of breath. Advised him that I would send in a prescription for the furosemide 40mg  to take twice daily and he can stop taking the torsemide.  He also says that the medication changes that cardiology made (adding corlanor and spironolactone and stopping potassium) have made him feel funny. He couldn't really elaborate on symptoms and he was encouraged to call cardiologist office to discuss this further. Is scheduled for an echocardiogram on 10/25/17.

## 2017-10-25 ENCOUNTER — Other Ambulatory Visit: Payer: Self-pay

## 2017-10-25 ENCOUNTER — Telehealth: Payer: Self-pay | Admitting: Internal Medicine

## 2017-10-25 ENCOUNTER — Telehealth: Payer: Self-pay

## 2017-10-25 ENCOUNTER — Ambulatory Visit (INDEPENDENT_AMBULATORY_CARE_PROVIDER_SITE_OTHER): Payer: Commercial Managed Care - PPO

## 2017-10-25 DIAGNOSIS — I5023 Acute on chronic systolic (congestive) heart failure: Secondary | ICD-10-CM | POA: Diagnosis not present

## 2017-10-25 LAB — ECHOCARDIOGRAM COMPLETE
AO mean calculated velocity dopler: 57.7 cm/s
AV Area mean vel: 3.84 cm2
AV Mean grad: 2 mmHg
AV area mean vel ind: 1.65 cm2/m2
AV vel: 3.86
AVA: 3.86 cm2
AVAREAVTIIND: 1.66 cm2/m2
AVCELMEANRAT: 0.72
Ao-asc: 36 cm
CHL CUP AV VALUE AREA INDEX: 1.66
CHL CUP MV DEC (S): 102
E decel time: 102 msec
IVS/LV PW RATIO, ED: 1.09
LA vol: 139 mL
LADIAMINDEX: 2.2 cm/m2
LASIZE: 51 mm
LAVOLA4C: 95 mL
LAVOLIN: 59.9 mL/m2
LDCA: 5.31 cm2
LEFT ATRIUM END SYS DIAM: 51 mm
LV PW d: 11 mm — AB (ref 0.6–1.1)
LVOT SV: 55 mL
LVOT VTI: 10.4 cm
LVOT diameter: 26 mm
LVOT peak VTI: 0.73 cm
MV Peak grad: 2 mmHg
MV pk A vel: 33.5 m/s
MV pk E vel: 77.1 m/s
MVAP: 7.33 cm2
P 1/2 time: 30 ms
RV LATERAL S' VELOCITY: 12 cm/s
Reg peak vel: 271 cm/s
TAPSE: 20.4 mm
TRMAXVEL: 271 cm/s
VTI: 14.3 cm

## 2017-10-25 MED ORDER — FLUTICASONE-SALMETEROL 250-50 MCG/DOSE IN AEPB: 1.0000 | INHALATION_SPRAY | Freq: Two times a day (BID) | RESPIRATORY_TRACT | 0 refills | Status: DC

## 2017-10-25 NOTE — Telephone Encounter (Signed)
Advair has been sent to pharmacy with zero refills. Patient needs office visit.

## 2017-10-25 NOTE — Telephone Encounter (Signed)
Pt states he thinks he needs a 6 minute walk test. Please call and advise.

## 2017-10-25 NOTE — Telephone Encounter (Signed)
Message left for patient that he will need another office visit before SMW can be scheduled.ss

## 2017-10-25 NOTE — Telephone Encounter (Signed)
Pt states he needs a refill on his inhaler. He is not sure what the name of it is. Sent to [facility]

## 2017-10-31 ENCOUNTER — Telehealth: Payer: Self-pay | Admitting: Cardiovascular Disease

## 2017-10-31 NOTE — Telephone Encounter (Signed)
No answer. Left message to call back.   

## 2017-10-31 NOTE — Telephone Encounter (Signed)
Patient sister calling re echo results   Please call

## 2017-11-01 NOTE — Telephone Encounter (Signed)
Reviewed with patient and sister. See echo results for details.

## 2017-11-12 ENCOUNTER — Ambulatory Visit (INDEPENDENT_AMBULATORY_CARE_PROVIDER_SITE_OTHER): Payer: Commercial Managed Care - PPO | Admitting: Cardiovascular Disease

## 2017-11-12 ENCOUNTER — Encounter: Payer: Self-pay | Admitting: Cardiovascular Disease

## 2017-11-12 VITALS — BP 110/80 | HR 93 | Ht 73.35 in | Wt 251.5 lb

## 2017-11-12 DIAGNOSIS — I472 Ventricular tachycardia: Secondary | ICD-10-CM | POA: Diagnosis not present

## 2017-11-12 DIAGNOSIS — Z7189 Other specified counseling: Secondary | ICD-10-CM | POA: Diagnosis not present

## 2017-11-12 DIAGNOSIS — I4729 Other ventricular tachycardia: Secondary | ICD-10-CM

## 2017-11-12 DIAGNOSIS — I10 Essential (primary) hypertension: Secondary | ICD-10-CM | POA: Diagnosis not present

## 2017-11-12 DIAGNOSIS — I5023 Acute on chronic systolic (congestive) heart failure: Secondary | ICD-10-CM

## 2017-11-12 MED ORDER — CARVEDILOL 6.25 MG PO TABS: 6.2500 mg | ORAL_TABLET | Freq: Two times a day (BID) | ORAL | 3 refills | Status: DC

## 2017-11-12 MED ORDER — TORSEMIDE 20 MG PO TABS: ORAL_TABLET | ORAL | 5 refills | Status: DC

## 2017-11-12 NOTE — Patient Instructions (Signed)
Medication Instructions:  Your physician has recommended you make the following change in your medication:  RESTART coreg 6.25mg  twice daily STOP taking lasix START taking torsemide 40mg  (2 tablets) in the morning and 20mg  (1 tablet) in the evening.    Labwork: BMET and CBC this Thursday at the [facility] lab. No appt needed.  Testing/Procedures: none  Follow-Up: Your physician recommends that you schedule a follow-up appointment in: 1 month with Dr. Fletcher Anon   Any Other Special Instructions Will Be Listed Below (If Applicable). Referral to  [name], electrophysiologist, for consideration of ICD implant.      If you need a refill on your cardiac medications before your next appointment, please call your pharmacy.

## 2017-11-12 NOTE — Progress Notes (Signed)
Cardiology Office Note   Date:  11/12/2017   ID:  Stephen [name], Stephen [name] 09/05/1966, MRN [id number]  PCP:  [name], MD  Cardiologist:   Kathlyn Sacramento, MD   Chief Complaint  Patient presents with  . other    1 month follow up after Echo. Meds reviewed by the pt. verbally. Pt. c/o coughing, shortness of breath and LE edema.       History of Present Illness: Stephen [name] is a 51 y.o. male who presents for a follow-up visit regarding chronic systolic heart failure. He was followed in the past by [name].  The patient has prolonged history of hypertension and was hospitalized in June 2017 with acute systolic heart failure with ejection fraction of 20% by echocardiogram. He was treated medically. He ultimately underwent cardiac catheterization in September of this year which showed normal coronary arteries. LVEDP was 31 mmHg. He had TIA in 2017 and has been on Plavix since then. He quit smoking about 2 years ago and denies any recreational drug use. He works at [employer] and cooks for fun.  He does have chronic kidney disease with creatinine around 1.4.  He had previous nonsustained ventricular tachycardia and was treated with amiodarone did not tolerate the medication due to constipation. During last visit, I added ivabradine given resting tachycardia. He had an echocardiogram done which showed an EF of less than 56%, grade 2 diastolic dysfunction, mild to moderate mitral regurgitation and moderate pulmonary hypertension.  Since last visit, he had worsening dyspnea, leg and abdominal swelling and weight gain.  He gained 2 pounds the last time I saw him.  He continues to take furosemide 40 mg twice daily but he reports poor urine output.  He stopped taking carvedilol on his own due to confusion.   Past Medical History:  Diagnosis Date  . CHF (congestive heart failure) (HCC)    nonischemic cardiomyopathy, EF 25%  . Hypertension   . TIA (transient ischemic attack) 06/21/16      Past Surgical History:  Procedure Laterality Date  . KNEE SURGERY Right   . VASECTOMY       Current Outpatient Medications  Medication Sig Dispense Refill  . acetaminophen (TYLENOL) 500 MG tablet Take 1 capsule by mouth every 4 (four) hours as needed for fever.    Marland Kitchen albuterol (PROVENTIL HFA;VENTOLIN HFA) 108 (90 Base) MCG/ACT inhaler Inhale 2 puffs into the lungs every 4 (four) hours as needed for wheezing or shortness of breath. 1 Inhaler 0  . atorvastatin (LIPITOR) 40 MG tablet Take 1 tablet (40 mg total) by mouth daily at 6 PM. (Patient taking differently: Take 40 mg by mouth daily. ) 30 tablet 2  . Camphor-Eucalyptus-Menthol (CHEST RUB) 4.8-1.2-2.6 % OINT Apply 1 application topically daily as needed (congestino).     . carvedilol (COREG) 6.25 MG tablet Take 3.125 mg by mouth 2 (two) times daily.     . clopidogrel (PLAVIX) 75 MG tablet Take 1 tablet (75 mg total) by mouth daily. 30 tablet 2  . cyclobenzaprine (FLEXERIL) 10 MG tablet Take 1 tablet (10 mg total) by mouth 3 (three) times daily as needed for muscle spasms. 20 tablet 0  . diclofenac sodium (VOLTAREN) 1 % GEL Apply 2 g topically 4 (four) times daily as needed (pain).     Marland Kitchen docusate sodium (COLACE) 100 MG capsule Take 1 capsule (100 mg total) by mouth 2 (two) times daily as needed for mild constipation. 10 capsule 0  . fluticasone (FLONASE)  50 MCG/ACT nasal spray Place 2 sprays into both nostrils daily. (Patient taking differently: Place 2 sprays into both nostrils daily as needed for allergies. ) 16 g 2  . Fluticasone-Salmeterol (ADVAIR DISKUS) 250-50 MCG/DOSE AEPB Inhale 1 puff into the lungs 2 (two) times daily. 1 each 0  . furosemide (LASIX) 40 MG tablet Take 1 tablet (40 mg total) by mouth 2 (two) times daily. 60 tablet 5  . ivabradine (CORLANOR) 5 MG TABS tablet Take 1 tablet (5 mg total) by mouth 2 (two) times daily with a meal. 60 tablet 3  . Multiple Vitamin (MULTIVITAMIN WITH MINERALS) TABS tablet Take 1 tablet by  mouth daily.    . nitroGLYCERIN (NITROSTAT) 0.4 MG SL tablet Place 1 tablet (0.4 mg total) under the tongue every 5 (five) minutes as needed for chest pain. 30 tablet 0  . pantoprazole (PROTONIX) 40 MG tablet Take 1 tablet (40 mg total) by mouth daily. 30 tablet 0  . polyethylene glycol (MIRALAX / GLYCOLAX) packet Take 17 g by mouth daily. (Patient taking differently: Take 17 g by mouth daily as needed for mild constipation. ) 14 each 0  . sacubitril-valsartan (ENTRESTO) 49-51 MG Take 1 tablet by mouth 2 (two) times daily. 60 tablet 5  . spironolactone (ALDACTONE) 25 MG tablet Take 1 tablet (25 mg total) by mouth daily. 30 tablet 3  . tiotropium (SPIRIVA HANDIHALER) 18 MCG inhalation capsule Place 18 mcg daily into inhaler and inhale.     . traMADol (ULTRAM) 50 MG tablet Take 1 tablet (50 mg total) by mouth every 6 (six) hours as needed. (Patient taking differently: Take 50 mg by mouth every 6 (six) hours as needed for moderate pain. ) 20 tablet 0   No current facility-administered medications for this visit.     Allergies:   Lisinopril    Social History:  The patient  reports that he quit smoking about 20 months ago. His smoking use included cigarettes. He has a 5.00 pack-year smoking history. he has never used smokeless tobacco. He reports that he drinks alcohol. He reports that he does not use drugs.   Family History:  The patient's family history includes CAD in his father; Heart attack in his father; Heart failure in his mother; Hypertension in his father and mother.    ROS:  Please see the history of present illness.   Otherwise, review of systems are positive for none.   All other systems are reviewed and negative.    PHYSICAL EXAM: VS:  BP 110/80 (BP Location: Left Arm, Patient Position: Sitting, Cuff Size: Normal)   Pulse 93   Ht 6' 1.35" (1.863 m)   Wt 251 lb 8 oz (114.1 kg)   BMI 32.87 kg/m  , BMI Body mass index is 32.87 kg/m. GEN: Well nourished, well developed, in no  acute distress  HEENT: normal  Neck: no  carotid bruits, or masses.  Severe JVD Cardiac: RRR with frequent premature beats; no murmurs, rubs, or gallops, moderate bilateral leg edema  Respiratory:  clear to auscultation bilaterally, normal work of breathing GI: soft, nontender, distended with possible ascites, + BS MS: no deformity or atrophy  Skin: warm and dry, no rash Neuro:  Strength and sensation are intact Psych: euthymic mood, full affect   EKG:  EKG is ordered today. The ekg ordered today demonstrates sinus rhythm with frequent PVCs, left ventricular hypertrophy with QRS widening.  Marland KitchenRecent Labs: 06/18/2017: ALT 26 09/08/2017: TSH 1.539 09/10/2017: Magnesium 1.9 09/27/2017: Hemoglobin 14.6; Platelets 245  10/08/2017: B Natriuretic Peptide 1,259.0; BUN 28; Creatinine, Ser 1.84; Potassium 4.3; Sodium 140    Lipid Panel    Component Value Date/Time   CHOL 173 06/22/2016 0755   TRIG 65 06/22/2016 0755   HDL 36 (L) 06/22/2016 0755   CHOLHDL 4.8 06/22/2016 0755   VLDL 13 06/22/2016 0755   LDLCALC 124 (H) 06/22/2016 0755      Wt Readings from Last 3 Encounters:  11/12/17 251 lb 8 oz (114.1 kg)  10/08/17 235 lb (106.6 kg)  10/03/17 237 lb 2 oz (107.6 kg)      Other studies Reviewed:   PAD Screen 10/08/2017  Previous PAD dx? No  Previous surgical procedure? No  Pain with walking? Yes  Subsides with rest? Yes  Feet/toe relief with dangling? No  Painful, non-healing ulcers? No  Extremities discolored? No      ASSESSMENT AND PLAN:  1.  Acute on chronic systolic heart failure with severely reduced LV systolic function due to nonischemic cardiomyopathy.  Most recent ejection fraction was 15% in spite of optimal medical therapy.  The patient appears to be significantly volume overloaded at the present time he has not been responding well to furosemide.  I elected to switch furosemide to torsemide 40 mg in the morning and 20 mg in the afternoon.  We will check labs in 2  days.  Underlying chronic kidney disease probably making diuresis more difficult. Resting tachycardia improved with ivabradine.  However, he stopped taking carvedilol by mistake.  I resume carvedilol 6.25 mg twice daily.  Continue Entresto and spironolactone.  If no improvement in symptoms, he might require hospital admission for IV diuresis.  2. Nonsustained ventricular tachycardia: Frequent PVCs might be affecting his ejection fraction. Unfortunately, it appears that he did not tolerate amiodarone due to GI symptoms and constipation.  I resume carvedilol as outlined above.  I am referring him to [name] to consider an ICD placement.   The patient might ultimately require advanced heart failure therapies possible consideration of cardiac transplant if a candidate.  Underlying chronic kidney disease to be an issue for placing a left ventricular assist device.   Disposition:   FU with me in 1 month  Signed,  Kathlyn Sacramento, MD  11/12/2017 4:42 PM    [facility]

## 2017-11-14 ENCOUNTER — Other Ambulatory Visit
Admission: RE | Admit: 2017-11-14 | Discharge: 2017-11-14 | Disposition: A | Discharge status: Home / Self Care | Payer: Commercial Managed Care - PPO | Source: Ambulatory Visit | Attending: Cardiovascular Disease | Admitting: Cardiovascular Disease

## 2017-11-14 DIAGNOSIS — I10 Essential (primary) hypertension: Secondary | ICD-10-CM | POA: Diagnosis not present

## 2017-11-14 LAB — BASIC METABOLIC PANEL
Anion gap: 10 (ref 5–15)
BUN: 27 mg/dL — AB (ref 6–20)
CO2: 28 mmol/L (ref 22–32)
CREATININE: 1.69 mg/dL — AB (ref 0.61–1.24)
Calcium: 9 mg/dL (ref 8.9–10.3)
Chloride: 101 mmol/L (ref 101–111)
GFR calc Af Amer: 52 mL/min — ABNORMAL LOW (ref >60)
GFR, EST NON AFRICAN AMERICAN: 45 mL/min — AB (ref >60)
Glucose, Bld: 96 mg/dL (ref 65–99)
POTASSIUM: 3.7 mmol/L (ref 3.5–5.1)
SODIUM: 139 mmol/L (ref 135–145)

## 2017-11-14 LAB — CBC
HCT: 48.5 % (ref 40.0–52.0)
Hemoglobin: 15.7 g/dL (ref 13.0–18.0)
MCH: 29.4 pg (ref 26.0–34.0)
MCHC: 32.3 g/dL (ref 32.0–36.0)
MCV: 91.1 fL (ref 80.0–100.0)
PLATELETS: 198 10*3/uL (ref 150–440)
RBC: 5.33 MIL/uL (ref 4.40–5.90)
RDW: 16 % — ABNORMAL HIGH (ref 11.5–14.5)
WBC: 8.4 10*3/uL (ref 3.8–10.6)

## 2017-11-15 ENCOUNTER — Telehealth: Payer: Self-pay | Admitting: Cardiovascular Disease

## 2017-11-15 ENCOUNTER — Other Ambulatory Visit: Payer: Self-pay

## 2017-11-15 DIAGNOSIS — I5023 Acute on chronic systolic (congestive) heart failure: Secondary | ICD-10-CM

## 2017-11-15 NOTE — Telephone Encounter (Signed)
Lmov for patient to call back and schedule appointments

## 2017-11-15 NOTE — Telephone Encounter (Signed)
Pt needs 1 month follow up with Dr. Fletcher Anon and a new patient appt with [name] for consideration of ICD per November 13 AVS. Routed to [facility]

## 2017-11-18 ENCOUNTER — Other Ambulatory Visit: Payer: Self-pay

## 2017-11-18 DIAGNOSIS — I5023 Acute on chronic systolic (congestive) heart failure: Secondary | ICD-10-CM

## 2017-11-18 NOTE — Telephone Encounter (Signed)
Lmov for patient to call back and schedule appointments

## 2017-11-19 ENCOUNTER — Other Ambulatory Visit
Admission: RE | Admit: 2017-11-19 | Discharge: 2017-11-19 | Disposition: A | Discharge status: Home / Self Care | Payer: Commercial Managed Care - PPO | Source: Ambulatory Visit | Attending: Cardiovascular Disease | Admitting: Cardiovascular Disease

## 2017-11-19 DIAGNOSIS — I5023 Acute on chronic systolic (congestive) heart failure: Secondary | ICD-10-CM

## 2017-11-19 LAB — BASIC METABOLIC PANEL
Anion gap: 9 (ref 5–15)
BUN: 22 mg/dL — AB (ref 6–20)
CHLORIDE: 105 mmol/L (ref 101–111)
CO2: 24 mmol/L (ref 22–32)
CREATININE: 1.38 mg/dL — AB (ref 0.61–1.24)
Calcium: 9.2 mg/dL (ref 8.9–10.3)
GFR calc Af Amer: >60 mL/min (ref >60)
GFR calc non Af Amer: 57 mL/min — ABNORMAL LOW (ref >60)
GLUCOSE: 96 mg/dL (ref 65–99)
POTASSIUM: 3.9 mmol/L (ref 3.5–5.1)
SODIUM: 138 mmol/L (ref 135–145)

## 2017-11-25 ENCOUNTER — Other Ambulatory Visit: Payer: Self-pay | Admitting: Internal Medicine

## 2017-11-25 MED ORDER — FLUTICASONE-SALMETEROL 250-50 MCG/DOSE IN AEPB: 1.0000 | INHALATION_SPRAY | Freq: Two times a day (BID) | RESPIRATORY_TRACT | 0 refills | Status: DC

## 2017-12-09 ENCOUNTER — Ambulatory Visit: Payer: Commercial Managed Care - PPO | Admitting: Family

## 2017-12-19 ENCOUNTER — Encounter: Payer: Self-pay | Admitting: Internal Medicine

## 2017-12-19 ENCOUNTER — Ambulatory Visit: Payer: Commercial Managed Care - PPO | Admitting: Internal Medicine

## 2017-12-19 ENCOUNTER — Telehealth: Payer: Self-pay | Admitting: Cardiovascular Disease

## 2017-12-19 ENCOUNTER — Other Ambulatory Visit: Payer: Self-pay

## 2017-12-19 VITALS — BP 120/80 | HR 88 | Resp 97 | Ht 73.5 in | Wt 252.0 lb

## 2017-12-19 DIAGNOSIS — I5022 Chronic systolic (congestive) heart failure: Principal | ICD-10-CM

## 2017-12-19 DIAGNOSIS — I5023 Acute on chronic systolic (congestive) heart failure: Secondary | ICD-10-CM

## 2017-12-19 DIAGNOSIS — I493 Ventricular premature depolarization: Secondary | ICD-10-CM | POA: Diagnosis not present

## 2017-12-19 DIAGNOSIS — I38 Endocarditis, valve unspecified: Secondary | ICD-10-CM

## 2017-12-19 DIAGNOSIS — I472 Ventricular tachycardia: Secondary | ICD-10-CM | POA: Diagnosis not present

## 2017-12-19 DIAGNOSIS — I4729 Other ventricular tachycardia: Secondary | ICD-10-CM

## 2017-12-19 MED ORDER — AMIODARONE HCL 200 MG PO TABS: 200.0000 mg | ORAL_TABLET | Freq: Every day | ORAL | 3 refills | Status: DC

## 2017-12-19 MED ORDER — AMIODARONE HCL 200 MG PO TABS: ORAL_TABLET | ORAL | 0 refills | Status: DC

## 2017-12-19 NOTE — Telephone Encounter (Signed)
Wellington Hampshire, MD  Georgiana Shore, RN  Cc: Lynford Humphrey, RN        Yes I want him to attend cardiac rehab for systolic heart failure.    Placed referral to cardiac rehab

## 2017-12-19 NOTE — Progress Notes (Signed)
ELECTROPHYSIOLOGY CONSULT NOTE  Patient ID: Stephen [name], MRN: [id number], DOB/AGE: 52-Jan-1967 52 y.o. Admit date: (Not on file) Date of Consult: 12/19/2017  Primary Physician: [name], MD Primary Cardiologist: MA     Hendricks Schwandt Wissing is a 51 y.o. male who is being seen today for the evaluation of ICD at the request of MA.    HPI Stephen [name] is a 51 y.o. male referred for consideration of an ICD.  He has a nonischemic cardiomyopathy confirmed by catheterization 9/18 at which time his LVEDP was 31.  He has  persistent left ventricular dysfunction dating back to at least June 2017.   TIA 2017 treated with Plavix.  ECG 11/18 demonstrated frequent ventricular ectopy with a right bundle superior axis  morphology.  Apparently amio was tried in the past and was stopped because of a concern re constipation  He has no palps,  Had syncope 2003  Some edema  Nocturnal dyspnea but resolves in 2 min of standing   Past Medical History:  Diagnosis Date  . CHF (congestive heart failure) (HCC)    nonischemic cardiomyopathy, EF 25%  . Hypertension   . TIA (transient ischemic attack) 06/21/16      Surgical History:  Past Surgical History:  Procedure Laterality Date  . KNEE SURGERY Right   . LEFT HEART CATH AND CORONARY ANGIOGRAPHY N/A 09/09/2017   Procedure: LEFT HEART CATH AND CORONARY ANGIOGRAPHY;  Surgeon: Stephen Spray, MD;  Location: [facility];  Service: Cardiovascular;  Laterality: N/A;  . VASECTOMY       Home Meds: Prior to Admission medications   Medication Sig Start Date End Date Taking? Authorizing Provider  acetaminophen (TYLENOL) 500 MG tablet Take 1 capsule by mouth every 4 (four) hours as needed for fever.    [provider]  albuterol (PROVENTIL HFA;VENTOLIN HFA) 108 (90 Base) MCG/ACT inhaler Inhale 2 puffs into the lungs every 4 (four) hours as needed for wheezing or shortness of breath. 03/22/17   Stephen Cha, MD    atorvastatin (LIPITOR) 40 MG tablet Take 1 tablet (40 mg total) by mouth daily at 6 PM. Patient taking differently: Take 40 mg by mouth daily.  06/22/16   Stephen Lighter, MD  Camphor-Eucalyptus-Menthol (CHEST RUB) 4.8-1.2-2.6 % OINT Apply 1 application topically daily as needed (congestino).     [provider]  carvedilol (COREG) 6.25 MG tablet Take 1 tablet (6.25 mg total) 2 (two) times daily by mouth. 11/12/17   Wellington Hampshire, MD  clopidogrel (PLAVIX) 75 MG tablet Take 1 tablet (75 mg total) by mouth daily. 06/22/16   Stephen Lighter, MD  cyclobenzaprine (FLEXERIL) 10 MG tablet Take 1 tablet (10 mg total) by mouth 3 (three) times daily as needed for muscle spasms. 09/10/17   Stephen Mango, MD  diclofenac sodium (VOLTAREN) 1 % GEL Apply 2 g topically 4 (four) times daily as needed (pain).     [provider]  docusate sodium (COLACE) 100 MG capsule Take 1 capsule (100 mg total) by mouth 2 (two) times daily as needed for mild constipation. 09/10/17   [name], [name], MD  fluticasone (FLONASE) 50 MCG/ACT nasal [name] Place 2 sprays into both nostrils daily. Patient taking differently: Place 2 sprays into both nostrils daily as needed for allergies.  06/20/17   Stephen Flock, MD  Fluticasone-Salmeterol (ADVAIR DISKUS) 250-50 MCG/DOSE AEPB Inhale 1 puff into the lungs 2 (two) times daily. 11/25/17 11/25/18  Stephen Lipps, MD  ivabradine Steffanie Dunn)  5 MG TABS tablet Take 1 tablet (5 mg total) by mouth 2 (two) times daily with a meal. 10/08/17   Wellington Hampshire, MD  Multiple Vitamin (MULTIVITAMIN WITH MINERALS) TABS tablet Take 1 tablet by mouth daily.    [provider]  nitroGLYCERIN (NITROSTAT) 0.4 MG SL tablet Place 1 tablet (0.4 mg total) under the tongue every 5 (five) minutes as needed for chest pain. 07/31/17   Stephen Loll, MD  pantoprazole (PROTONIX) 40 MG tablet Take 1 tablet (40 mg total) by mouth daily. 06/20/17   Stephen Flock, MD  polyethylene glycol [facility] /  Floria Raveling) packet Take 17 g by mouth daily. Patient taking differently: Take 17 g by mouth daily as needed for mild constipation.  09/11/17   [name], [name], MD  sacubitril-valsartan (ENTRESTO) 49-51 MG Take 1 tablet by mouth 2 (two) times daily. 09/30/17   Stephen Graff, FNP  spironolactone (ALDACTONE) 25 MG tablet Take 1 tablet (25 mg total) by mouth daily. 10/08/17   Wellington Hampshire, MD  tiotropium (SPIRIVA HANDIHALER) 18 MCG inhalation capsule Place 18 mcg daily into inhaler and inhale.  06/20/17   [provider]  torsemide (DEMADEX) 20 MG tablet Take 40mg  (2 tablets) in the morning and 20mg  (1 tablet) in the evening. 11/12/17   Wellington Hampshire, MD  traMADol (ULTRAM) 50 MG tablet Take 1 tablet (50 mg total) by mouth every 6 (six) hours as needed. Patient taking differently: Take 50 mg by mouth every 6 (six) hours as needed for moderate pain.  09/10/17 09/10/18  Stephen Mango, MD    Allergies:  Allergies  Allergen Reactions  . Lisinopril Cough    Social History   Socioeconomic History  . Marital status: Single    Spouse name: Not on file  . Number of children: Not on file  . Years of education: Not on file  . Highest education level: Not on file  Social Needs  . Financial resource strain: Not on file  . Food insecurity - worry: Not on file  . Food insecurity - inability: Not on file  . Transportation needs - medical: Not on file  . Transportation needs - non-medical: Not on file  Occupational History  . Not on file  Tobacco Use  . Smoking status: Former Smoker    Packs/day: 0.25    Years: 20.00    Pack years: 5.00    Types: Cigarettes    Last attempt to quit: 02/20/2016    Years since quitting: 1.8  . Smokeless tobacco: Never Used  Substance and Sexual Activity  . Alcohol use: Yes    Alcohol/week: 0.0 oz    Comment: beer occasional  . Drug use: No  . Sexual activity: Not on file  Other Topics Concern  . Not on file  Social History Narrative   Independent at  baseline     Family History  Problem Relation Age of Onset  . Hypertension Mother   . Heart failure Mother   . Hypertension Father   . CAD Father   . Heart attack Father      ROS:  Please see the history of present illness.     All other systems reviewed and negative.    Physical Exam:  Blood pressure 120/80, pulse 88, resp. rate (!) 97, height 6' 1.5" (1.867 m), weight 252 lb (114.3 kg). General: Well developed, well nourished male in no acute distress. Head: Normocephalic, atraumatic, sclera non-icteric, no xanthomas, nares are without discharge. EENT: normal  Lymph  Nodes:  none Neck: Negative for carotid bruits. JVD 8-10  back:without scoliosis kyphosis  Lungs: Clear bilaterally to auscultation without wheezes, rales, or rhonchi. Breathing is unlabored. Heart: RRR with S1 S2. No  murmur . No rubs, or gallops appreciated. Abdomen: Soft, non-tender, non-distended with normoactive bowel sounds. No hepatomegaly. No rebound/guarding. No obvious abdominal masses. Msk:  Strength and tone appear normal for age. Extremities: No clubbing or cyanosis. tr edema.  Distal pedal pulses are 2+ and equal bilaterally. Skin: Warm and Dry Neuro: Alert and oriented X 3. CN III-XII intact Grossly normal sensory and motor function . Psych:  Responds to questions appropriately with a normal affect.      Labs: Cardiac Enzymes No results for input(s): CKTOTAL, CKMB, TROPONINI in the last 72 hours. CBC Lab Results  Component Value Date   WBC 8.4 11/14/2017   HGB 15.7 11/14/2017   HCT 48.5 11/14/2017   MCV 91.1 11/14/2017   PLT 198 11/14/2017   PROTIME: No results for input(s): LABPROT, INR in the last 72 hours. Chemistry No results for input(s): NA, K, CL, CO2, BUN, CREATININE, CALCIUM, PROT, BILITOT, ALKPHOS, ALT, AST, GLUCOSE in the last 168 hours.  Invalid input(s): LABALBU Lipids Lab Results  Component Value Date   CHOL 173 06/22/2016   HDL 36 (L) 06/22/2016   LDLCALC 124 (H)  06/22/2016   TRIG 65 06/22/2016   BNP No results found for: PROBNP Thyroid Function Tests: No results for input(s): TSH, T4TOTAL, T3FREE, THYROIDAB in the last 72 hours.  Invalid input(s): FREET3 Miscellaneous Lab Results  Component Value Date   DDIMER 1.13 (H) 09/27/2017    Radiology/Studies:  No results found.  EKG: Sinus with frequent ventricular ectopy.  Multiple ECGs have been reviewed over the last year.  All of them show PVCs with a right bundle superior axis morphology and at a density of greater than 30-40%  Assessment and Plan:  Cardiomyopathy-nonischemic  Congestive heart failure-class IIb-III 8  Sleep disordered breathing and daytime somnolence question sleep apnea  PVCs/nonsustained ventricular tachycardia-very frequent    His PVC burden is enormous.  In either to rise from the cardiomyopathy or it may be the cause or an exacerbating factor for his cardiomyopathy.  To try to elucidate this as it may have an impact the left ventricular function and hence the need for implantable defibrillator requires their obliteration.  Hence, we will begin him on amiodarone. ( not quite sure what to make of the constipation story--but our use of it See Below is hopefully short term and experimental.   I have reviewed the side effects with him and his sister.  While they our legion, the use of the amiodarone would be expected to be relatively short with the plan to reassess PVC density after about 8 weeks and if we have been able to significantly diminish the PVC burden we would then reassess left ventricular function.  There are data from [place] suggesting that flecainide can be used safely in a cohort of patients in whom the PVCs are responsible for the cardiomyopathy and so a transition later could be undertaken.   The role of a defibrillator is secondary to the issue of his cardiomyopathy.  In the event that his PVCs are obliterated and his cardiomyopathy does not improve we  would then discuss the role of an implantable defibrillator.  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  He has sleep disordered breathing and daytime somnolence.  Sister has noted an erratic breathing pattern with some gasping-like efforts.  A sleep study is probably in order.     Virl Axe

## 2017-12-19 NOTE — Patient Instructions (Addendum)
Medication Instructions: - Your physician has recommended you make the following change in your medication:   1) Torsemide 20 mg- take 3 tablets (60 mg) in the morning only x 3 days, then resume 2 tablets (40 mg) in the morning & 1 tablet (20 mg) in the evening  2) Start amiodarone 200 mg: - take 2 tablets (400 mg) by mouth TWICE daily x 2 weeks, then - take 2 tablets (400 mg) by mouth ONCE daily x 2 weeks, then  - take 1 tablet (200 mg) by mouth ONCE daily  Labwork: - none ordered  Procedures/Testing: - Your physician has recommended that you wear a 24 hour holter monitor- in 8 weeks (we may do an echocardiogram 4 weeks after that based on the results of the heart monitor). Holter monitors are medical devices that record the heart's electrical activity. Doctors most often use these monitors to diagnose arrhythmias. Arrhythmias are problems with the speed or rhythm of the heartbeat. The monitor is a small, portable device. You can wear one while you do your normal daily activities. This is usually used to diagnose what is causing palpitations/syncope (passing out).  Follow-Up: - Your physician recommends that you schedule a follow-up appointment in: 14-16 weeks with [name].   Any Additional Special Instructions Will Be Listed Below (If Applicable).     If you need a refill on your cardiac medications before your next appointment, please call your pharmacy.

## 2017-12-20 ENCOUNTER — Inpatient Hospital Stay
Admission: EM | Admit: 2017-12-20 | Discharge: 2017-12-23 | DRG: 193 | Disposition: A | Discharge status: Other Acute Inpatient Hospital | Payer: Commercial Managed Care - PPO | Attending: Internal Medicine | Admitting: Internal Medicine

## 2017-12-20 ENCOUNTER — Other Ambulatory Visit: Payer: Self-pay

## 2017-12-20 ENCOUNTER — Emergency Department: Payer: Commercial Managed Care - PPO

## 2017-12-20 DIAGNOSIS — I130 Hypertensive heart and chronic kidney disease with heart failure and stage 1 through stage 4 chronic kidney disease, or unspecified chronic kidney disease: Secondary | ICD-10-CM | POA: Diagnosis present

## 2017-12-20 DIAGNOSIS — I4901 Ventricular fibrillation: Secondary | ICD-10-CM | POA: Diagnosis not present

## 2017-12-20 DIAGNOSIS — J189 Pneumonia, unspecified organism: Secondary | ICD-10-CM

## 2017-12-20 DIAGNOSIS — E871 Hypo-osmolality and hyponatremia: Secondary | ICD-10-CM | POA: Diagnosis present

## 2017-12-20 DIAGNOSIS — I462 Cardiac arrest due to underlying cardiac condition: Secondary | ICD-10-CM | POA: Diagnosis not present

## 2017-12-20 DIAGNOSIS — Z87891 Personal history of nicotine dependence: Secondary | ICD-10-CM

## 2017-12-20 DIAGNOSIS — Z888 Allergy status to other drugs, medicaments and biological substances status: Secondary | ICD-10-CM | POA: Diagnosis not present

## 2017-12-20 DIAGNOSIS — J111 Influenza due to unidentified influenza virus with other respiratory manifestations: Secondary | ICD-10-CM

## 2017-12-20 DIAGNOSIS — I272 Pulmonary hypertension, unspecified: Secondary | ICD-10-CM | POA: Diagnosis present

## 2017-12-20 DIAGNOSIS — I252 Old myocardial infarction: Secondary | ICD-10-CM | POA: Diagnosis not present

## 2017-12-20 DIAGNOSIS — I5023 Acute on chronic systolic (congestive) heart failure: Secondary | ICD-10-CM | POA: Diagnosis present

## 2017-12-20 DIAGNOSIS — N183 Chronic kidney disease, stage 3 (moderate): Secondary | ICD-10-CM | POA: Diagnosis present

## 2017-12-20 DIAGNOSIS — E785 Hyperlipidemia, unspecified: Secondary | ICD-10-CM | POA: Diagnosis present

## 2017-12-20 DIAGNOSIS — I493 Ventricular premature depolarization: Secondary | ICD-10-CM | POA: Diagnosis not present

## 2017-12-20 DIAGNOSIS — E876 Hypokalemia: Secondary | ICD-10-CM | POA: Diagnosis present

## 2017-12-20 DIAGNOSIS — I429 Cardiomyopathy, unspecified: Secondary | ICD-10-CM | POA: Diagnosis present

## 2017-12-20 DIAGNOSIS — J1000 Influenza due to other identified influenza virus with unspecified type of pneumonia: Secondary | ICD-10-CM | POA: Diagnosis present

## 2017-12-20 DIAGNOSIS — Z8249 Family history of ischemic heart disease and other diseases of the circulatory system: Secondary | ICD-10-CM | POA: Diagnosis not present

## 2017-12-20 DIAGNOSIS — R059 Cough, unspecified: Secondary | ICD-10-CM

## 2017-12-20 DIAGNOSIS — I959 Hypotension, unspecified: Secondary | ICD-10-CM | POA: Diagnosis not present

## 2017-12-20 DIAGNOSIS — N179 Acute kidney failure, unspecified: Secondary | ICD-10-CM | POA: Diagnosis present

## 2017-12-20 DIAGNOSIS — Z7902 Long term (current) use of antithrombotics/antiplatelets: Secondary | ICD-10-CM

## 2017-12-20 DIAGNOSIS — Z79899 Other long term (current) drug therapy: Secondary | ICD-10-CM

## 2017-12-20 DIAGNOSIS — J9690 Respiratory failure, unspecified, unspecified whether with hypoxia or hypercapnia: Secondary | ICD-10-CM

## 2017-12-20 DIAGNOSIS — J440 Chronic obstructive pulmonary disease with acute lower respiratory infection: Secondary | ICD-10-CM | POA: Diagnosis present

## 2017-12-20 DIAGNOSIS — J9600 Acute respiratory failure, unspecified whether with hypoxia or hypercapnia: Secondary | ICD-10-CM | POA: Diagnosis not present

## 2017-12-20 DIAGNOSIS — R05 Cough: Secondary | ICD-10-CM | POA: Diagnosis present

## 2017-12-20 DIAGNOSIS — Z8673 Personal history of transient ischemic attack (TIA), and cerebral infarction without residual deficits: Secondary | ICD-10-CM | POA: Diagnosis not present

## 2017-12-20 DIAGNOSIS — I469 Cardiac arrest, cause unspecified: Secondary | ICD-10-CM

## 2017-12-20 HISTORY — DX: Cardiac arrhythmia, unspecified: I49.9

## 2017-12-20 HISTORY — DX: Cardiac arrest, cause unspecified: I46.9

## 2017-12-20 HISTORY — DX: Pneumonia, unspecified organism: J18.9

## 2017-12-20 LAB — COMPREHENSIVE METABOLIC PANEL
ALBUMIN: 4.1 g/dL (ref 3.5–5.0)
ALK PHOS: 55 U/L (ref 38–126)
ALT: 20 U/L (ref 17–63)
AST: 40 U/L (ref 15–41)
Anion gap: 14 (ref 5–15)
BILIRUBIN TOTAL: 1.9 mg/dL — AB (ref 0.3–1.2)
BUN: 22 mg/dL — AB (ref 6–20)
CALCIUM: 9.2 mg/dL (ref 8.9–10.3)
CO2: 22 mmol/L (ref 22–32)
Chloride: 100 mmol/L — ABNORMAL LOW (ref 101–111)
Creatinine, Ser: 1.41 mg/dL — ABNORMAL HIGH (ref 0.61–1.24)
GFR calc Af Amer: >60 mL/min (ref >60)
GFR calc non Af Amer: 56 mL/min — ABNORMAL LOW (ref >60)
GLUCOSE: 127 mg/dL — AB (ref 65–99)
Potassium: 3.4 mmol/L — ABNORMAL LOW (ref 3.5–5.1)
Sodium: 136 mmol/L (ref 135–145)
TOTAL PROTEIN: 7.1 g/dL (ref 6.5–8.1)

## 2017-12-20 LAB — CBC WITH DIFFERENTIAL/PLATELET
Basophils Absolute: 0 10*3/uL (ref 0–0.1)
Basophils Relative: 1 %
EOS ABS: 0 10*3/uL (ref 0–0.7)
EOS PCT: 0 %
HEMATOCRIT: 46.1 % (ref 40.0–52.0)
Hemoglobin: 15.3 g/dL (ref 13.0–18.0)
LYMPHS ABS: 0.4 10*3/uL — AB (ref 1.0–3.6)
LYMPHS PCT: 5 %
MCH: 29.7 pg (ref 26.0–34.0)
MCHC: 33.1 g/dL (ref 32.0–36.0)
MCV: 89.9 fL (ref 80.0–100.0)
MONO ABS: 0.6 10*3/uL (ref 0.2–1.0)
MONOS PCT: 7 %
Neutro Abs: 6.9 10*3/uL — ABNORMAL HIGH (ref 1.4–6.5)
Neutrophils Relative %: 87 %
Platelets: 142 10*3/uL — ABNORMAL LOW (ref 150–440)
RBC: 5.13 MIL/uL (ref 4.40–5.90)
RDW: 15.2 % — ABNORMAL HIGH (ref 11.5–14.5)
WBC: 7.9 10*3/uL (ref 3.8–10.6)

## 2017-12-20 LAB — BLOOD GAS, ARTERIAL
ACID-BASE DEFICIT: 0.5 mmol/L (ref 0.0–2.0)
BICARBONATE: 23.2 mmol/L (ref 20.0–28.0)
O2 SAT: 98.4 %
PATIENT TEMPERATURE: 37
PH ART: 7.43 (ref 7.350–7.450)
PO2 ART: 109 mmHg — AB (ref 83.0–108.0)
pCO2 arterial: 35 mmHg (ref 32.0–48.0)

## 2017-12-20 LAB — INFLUENZA PANEL BY PCR (TYPE A & B)
Influenza A By PCR: POSITIVE — AB
Influenza B By PCR: NEGATIVE

## 2017-12-20 LAB — LACTIC ACID, PLASMA
Lactic Acid, Venous: 1.9 mmol/L (ref 0.5–1.9)
Lactic Acid, Venous: 2.7 mmol/L (ref 0.5–1.9)

## 2017-12-20 MED ORDER — METOLAZONE 5 MG PO TABS
5.0000 mg | ORAL_TABLET | Freq: Once | ORAL | Status: AC
  Administered 2017-12-20: 5 mg via ORAL
  Filled 2017-12-20: qty 1

## 2017-12-20 MED ORDER — ALBUTEROL SULFATE (2.5 MG/3ML) 0.083% IN NEBU
2.5000 mg | INHALATION_SOLUTION | RESPIRATORY_TRACT | Status: DC | PRN
  Administered 2017-12-21: 2.5 mg via RESPIRATORY_TRACT
  Filled 2017-12-20: qty 3

## 2017-12-20 MED ORDER — CARVEDILOL 6.25 MG PO TABS
6.2500 mg | ORAL_TABLET | Freq: Two times a day (BID) | ORAL | Status: DC
  Administered 2017-12-20: 6.25 mg via ORAL
  Filled 2017-12-20: qty 1

## 2017-12-20 MED ORDER — SACUBITRIL-VALSARTAN 49-51 MG PO TABS
1.0000 | ORAL_TABLET | Freq: Two times a day (BID) | ORAL | Status: DC
  Administered 2017-12-20: 1 via ORAL
  Filled 2017-12-20 (×2): qty 1

## 2017-12-20 MED ORDER — ONDANSETRON HCL 4 MG/2ML IJ SOLN
4.0000 mg | Freq: Four times a day (QID) | INTRAMUSCULAR | Status: DC | PRN
  Administered 2017-12-23: 4 mg via INTRAVENOUS
  Filled 2017-12-20: qty 2

## 2017-12-20 MED ORDER — ATORVASTATIN CALCIUM 20 MG PO TABS
40.0000 mg | ORAL_TABLET | Freq: Every day | ORAL | Status: DC
  Administered 2017-12-20 – 2017-12-22 (×3): 40 mg via ORAL
  Filled 2017-12-20 (×3): qty 2

## 2017-12-20 MED ORDER — DEXTROSE 5 % IV SOLN
1.0000 g | INTRAVENOUS | Status: DC
  Administered 2017-12-21 – 2017-12-22 (×2): 1 g via INTRAVENOUS
  Filled 2017-12-20 (×3): qty 10

## 2017-12-20 MED ORDER — TIOTROPIUM BROMIDE MONOHYDRATE 18 MCG IN CAPS
18.0000 ug | ORAL_CAPSULE | Freq: Every day | RESPIRATORY_TRACT | Status: DC
  Administered 2017-12-23: 18 ug via RESPIRATORY_TRACT
  Filled 2017-12-20: qty 5

## 2017-12-20 MED ORDER — SODIUM CHLORIDE 0.9 % IV BOLUS (SEPSIS)
1000.0000 mL | Freq: Once | INTRAVENOUS | Status: AC
  Administered 2017-12-20: 1000 mL via INTRAVENOUS

## 2017-12-20 MED ORDER — IPRATROPIUM-ALBUTEROL 0.5-2.5 (3) MG/3ML IN SOLN
3.0000 mL | Freq: Four times a day (QID) | RESPIRATORY_TRACT | Status: DC
  Administered 2017-12-20 – 2017-12-22 (×8): 3 mL via RESPIRATORY_TRACT
  Filled 2017-12-20 (×7): qty 3

## 2017-12-20 MED ORDER — SPIRONOLACTONE 25 MG PO TABS: 25.0000 mg | ORAL_TABLET | Freq: Every day | ORAL | Status: DC

## 2017-12-20 MED ORDER — CYCLOBENZAPRINE HCL 10 MG PO TABS
10.0000 mg | ORAL_TABLET | Freq: Three times a day (TID) | ORAL | Status: DC | PRN
  Administered 2017-12-20 – 2017-12-22 (×2): 10 mg via ORAL
  Filled 2017-12-20 (×3): qty 1

## 2017-12-20 MED ORDER — IPRATROPIUM-ALBUTEROL 0.5-2.5 (3) MG/3ML IN SOLN
RESPIRATORY_TRACT | Status: AC
  Filled 2017-12-20: qty 3

## 2017-12-20 MED ORDER — BISACODYL 5 MG PO TBEC: 5.0000 mg | DELAYED_RELEASE_TABLET | Freq: Every day | ORAL | Status: DC | PRN

## 2017-12-20 MED ORDER — ACETAMINOPHEN 500 MG PO TABS
1000.0000 mg | ORAL_TABLET | Freq: Once | ORAL | Status: AC
  Administered 2017-12-20: 1000 mg via ORAL
  Filled 2017-12-20: qty 2

## 2017-12-20 MED ORDER — DEXTROSE 5 % IV SOLN
500.0000 mg | INTRAVENOUS | Status: DC
  Administered 2017-12-21 – 2017-12-22 (×2): 500 mg via INTRAVENOUS
  Filled 2017-12-20 (×3): qty 500

## 2017-12-20 MED ORDER — IVABRADINE HCL 5 MG PO TABS
5.0000 mg | ORAL_TABLET | Freq: Two times a day (BID) | ORAL | Status: DC
  Administered 2017-12-21 – 2017-12-22 (×3): 5 mg via ORAL
  Filled 2017-12-20 (×5): qty 1

## 2017-12-20 MED ORDER — CLOPIDOGREL BISULFATE 75 MG PO TABS
75.0000 mg | ORAL_TABLET | Freq: Every day | ORAL | Status: DC
  Administered 2017-12-20 – 2017-12-23 (×4): 75 mg via ORAL
  Filled 2017-12-20 (×4): qty 1

## 2017-12-20 MED ORDER — TRAMADOL HCL 50 MG PO TABS
50.0000 mg | ORAL_TABLET | Freq: Four times a day (QID) | ORAL | Status: DC | PRN
  Administered 2017-12-21 – 2017-12-22 (×4): 50 mg via ORAL
  Filled 2017-12-20 (×4): qty 1

## 2017-12-20 MED ORDER — SODIUM CHLORIDE 0.9% FLUSH: 3.0000 mL | INTRAVENOUS | Status: DC | PRN

## 2017-12-20 MED ORDER — PANTOPRAZOLE SODIUM 40 MG PO TBEC
40.0000 mg | DELAYED_RELEASE_TABLET | Freq: Every day | ORAL | Status: DC
  Administered 2017-12-20 – 2017-12-22 (×3): 40 mg via ORAL
  Filled 2017-12-20 (×3): qty 1

## 2017-12-20 MED ORDER — ACETAMINOPHEN 325 MG PO TABS
650.0000 mg | ORAL_TABLET | Freq: Four times a day (QID) | ORAL | Status: DC | PRN
  Administered 2017-12-21: 650 mg via ORAL
  Filled 2017-12-20: qty 2

## 2017-12-20 MED ORDER — OSELTAMIVIR PHOSPHATE 75 MG PO CAPS
75.0000 mg | ORAL_CAPSULE | Freq: Once | ORAL | Status: AC
  Administered 2017-12-20: 75 mg via ORAL
  Filled 2017-12-20: qty 1

## 2017-12-20 MED ORDER — DICLOFENAC SODIUM 1 % TD GEL
4.0000 g | Freq: Four times a day (QID) | TRANSDERMAL | Status: DC | PRN
  Filled 2017-12-20: qty 100

## 2017-12-20 MED ORDER — SODIUM CHLORIDE 0.9 % IV SOLN: 250.0000 mL | INTRAVENOUS | Status: DC | PRN

## 2017-12-20 MED ORDER — FUROSEMIDE 10 MG/ML IJ SOLN
40.0000 mg | Freq: Two times a day (BID) | INTRAMUSCULAR | Status: DC
  Administered 2017-12-21 – 2017-12-23 (×4): 40 mg via INTRAVENOUS
  Filled 2017-12-20 (×5): qty 4

## 2017-12-20 MED ORDER — CEFTRIAXONE SODIUM IN DEXTROSE 20 MG/ML IV SOLN
1.0000 g | Freq: Once | INTRAVENOUS | Status: AC
  Administered 2017-12-20: 1 g via INTRAVENOUS
  Filled 2017-12-20: qty 50

## 2017-12-20 MED ORDER — FLUTICASONE PROPIONATE 50 MCG/ACT NA SUSP
2.0000 | Freq: Every day | NASAL | Status: DC | PRN
  Filled 2017-12-20: qty 16

## 2017-12-20 MED ORDER — OSELTAMIVIR PHOSPHATE 75 MG PO CAPS
75.0000 mg | ORAL_CAPSULE | Freq: Two times a day (BID) | ORAL | Status: DC
  Administered 2017-12-21 – 2017-12-22 (×4): 75 mg via ORAL
  Filled 2017-12-20 (×5): qty 1

## 2017-12-20 MED ORDER — HEPARIN SODIUM (PORCINE) 5000 UNIT/ML IJ SOLN
5000.0000 [IU] | Freq: Three times a day (TID) | INTRAMUSCULAR | Status: DC
  Administered 2017-12-20 – 2017-12-23 (×8): 5000 [IU] via SUBCUTANEOUS
  Filled 2017-12-20 (×8): qty 1

## 2017-12-20 MED ORDER — ACETAMINOPHEN 650 MG RE SUPP: 650.0000 mg | Freq: Four times a day (QID) | RECTAL | Status: DC | PRN

## 2017-12-20 MED ORDER — DEXTROSE 5 % IV SOLN
500.0000 mg | Freq: Once | INTRAVENOUS | Status: AC
  Administered 2017-12-20: 500 mg via INTRAVENOUS
  Filled 2017-12-20: qty 500

## 2017-12-20 MED ORDER — DOCUSATE SODIUM 100 MG PO CAPS
100.0000 mg | ORAL_CAPSULE | Freq: Two times a day (BID) | ORAL | Status: DC | PRN
  Administered 2017-12-22: 100 mg via ORAL
  Filled 2017-12-20: qty 1

## 2017-12-20 MED ORDER — CHEST RUB 4.8-1.2-2.6 % EX OINT: 1.0000 "application " | TOPICAL_OINTMENT | Freq: Every day | CUTANEOUS | Status: DC | PRN

## 2017-12-20 MED ORDER — TORSEMIDE 20 MG PO TABS: 80.0000 mg | ORAL_TABLET | Freq: Every day | ORAL | Status: DC

## 2017-12-20 MED ORDER — SODIUM CHLORIDE 0.9% FLUSH
3.0000 mL | Freq: Two times a day (BID) | INTRAVENOUS | Status: DC
  Administered 2017-12-20 – 2017-12-22 (×5): 3 mL via INTRAVENOUS

## 2017-12-20 MED ORDER — NITROGLYCERIN 0.4 MG SL SUBL: 0.4000 mg | SUBLINGUAL_TABLET | SUBLINGUAL | Status: DC | PRN

## 2017-12-20 MED ORDER — ONDANSETRON HCL 4 MG PO TABS: 4.0000 mg | ORAL_TABLET | Freq: Four times a day (QID) | ORAL | Status: DC | PRN

## 2017-12-20 MED ORDER — GUAIFENESIN 100 MG/5ML PO SOLN
5.0000 mL | ORAL | Status: DC | PRN
  Administered 2017-12-21 – 2017-12-22 (×3): 100 mg via ORAL
  Filled 2017-12-20 (×5): qty 5

## 2017-12-20 MED ORDER — FUROSEMIDE 10 MG/ML IJ SOLN
40.0000 mg | Freq: Once | INTRAMUSCULAR | Status: AC
  Administered 2017-12-20: 40 mg via INTRAVENOUS
  Filled 2017-12-20: qty 4

## 2017-12-20 MED ORDER — POLYETHYLENE GLYCOL 3350 17 G PO PACK: 17.0000 g | PACK | Freq: Every day | ORAL | Status: DC | PRN

## 2017-12-20 NOTE — ED Notes (Signed)
See triage note  Presents with body aches and cough for couple of days  Low grade fever on arrival

## 2017-12-20 NOTE — ED Notes (Signed)
Walking back from bathroom  SOB with exertion  Temp is 102 .Marland Kitchen  PA aware

## 2017-12-20 NOTE — ED Provider Notes (Signed)
[facility] Emergency Department Provider Note   ____________________________________________   I have reviewed the triage vital signs and the nursing notes.   HISTORY  Chief Complaint Cough and Influenza   History limited by: Not Limited   HPI Stephen [name] is a 51 y.o. male who presents to the emergency department today because of concern for cough body aches and fever.  DURATION:2 days TIMING: constant, worsening SEVERITY: severe QUALITY: body aches CONTEXT: patient states that he has not felt well for the past two days. Has had cough, fevers and body aches. Did not have a flu shot this year. MODIFYING FACTORS: none  ASSOCIATED SYMPTOMS: has had some chest pain with the cough.  Per medical record review patient has a history of COPD, CHF, HTN.  Past Medical History:  Diagnosis Date  . CHF (congestive heart failure) (HCC)    nonischemic cardiomyopathy, EF 25%  . Hypertension   . TIA (transient ischemic attack) 06/21/16    Patient Active Problem List   Diagnosis Date Noted  . Chest pain 09/08/2017  . NSTEMI (non-ST elevated myocardial infarction) ([place]) 09/08/2017  . Bradycardia 08/30/2017  . Ventricular tachycardia ([place]) 07/29/2017  . COPD (chronic obstructive pulmonary disease) ([place]) 07/04/2017  . Chronic systolic CHF (congestive heart failure) ([place]) 05/20/2017  . TIA (transient ischemic attack) 06/22/2016  . HTN (hypertension) 06/22/2016  . Acute on chronic systolic CHF (congestive heart failure) ([place]) 06/04/2016    Past Surgical History:  Procedure Laterality Date  . KNEE SURGERY Right   . LEFT HEART CATH AND CORONARY ANGIOGRAPHY N/A 09/09/2017   Procedure: LEFT HEART CATH AND CORONARY ANGIOGRAPHY;  Surgeon: Teodoro Spray, MD;  Location: [facility];  Service: Cardiovascular;  Laterality: N/A;  . VASECTOMY      Prior to Admission medications   Medication Sig Start Date End Date Taking? Authorizing Provider   acetaminophen (TYLENOL) 500 MG tablet Take 1 capsule by mouth every 4 (four) hours as needed for fever.    [provider]  albuterol (PROVENTIL HFA;VENTOLIN HFA) 108 (90 Base) MCG/ACT inhaler Inhale 2 puffs into the lungs every 4 (four) hours as needed for wheezing or shortness of breath. 03/22/17   [name], MD  amiodarone (PACERONE) 200 MG tablet Take 2 tablets (400 mg) by mouth twice daily x 2 weeks, then take 2 tablets (400 mg) by mouth once daily x 2 weeks 12/19/17   Deboraha Sprang, MD  amiodarone (PACERONE) 200 MG tablet Take 1 tablet (200 mg total) by mouth daily. 12/19/17   Deboraha Sprang, MD  atorvastatin (LIPITOR) 40 MG tablet Take 1 tablet (40 mg total) by mouth daily at 6 PM. Patient taking differently: Take 40 mg by mouth daily.  06/22/16   [name], MD  Camphor-Eucalyptus-Menthol (CHEST RUB) 4.8-1.2-2.6 % OINT Apply 1 application topically daily as needed (congestino).     [provider]  carvedilol (COREG) 6.25 MG tablet Take 1 tablet (6.25 mg total) 2 (two) times daily by mouth. 11/12/17   Wellington Hampshire, MD  clopidogrel (PLAVIX) 75 MG tablet Take 1 tablet (75 mg total) by mouth daily. 06/22/16   [name], MD  cyclobenzaprine (FLEXERIL) 10 MG tablet Take 1 tablet (10 mg total) by mouth 3 (three) times daily as needed for muscle spasms. 09/10/17   [name], MD  diclofenac sodium (VOLTAREN) 1 % GEL Apply 2 g topically 4 (four) times daily as needed (pain).     [provider]  docusate sodium (  COLACE) 100 MG capsule Take 1 capsule (100 mg total) by mouth 2 (two) times daily as needed for mild constipation. 09/10/17   [name], [name], MD  fluticasone (FLONASE) 50 MCG/ACT nasal spray Place 2 sprays into both nostrils daily. Patient taking differently: Place 2 sprays into both nostrils daily as needed for allergies.  06/20/17   [name], MD  Fluticasone-Salmeterol (ADVAIR DISKUS) 250-50 MCG/DOSE AEPB Inhale 1 puff into the lungs 2  (two) times daily. 11/25/17 11/25/18  [name], MD  ivabradine (CORLANOR) 5 MG TABS tablet Take 1 tablet (5 mg total) by mouth 2 (two) times daily with a meal. 10/08/17   Wellington Hampshire, MD  Multiple Vitamin (MULTIVITAMIN WITH MINERALS) TABS tablet Take 1 tablet by mouth daily.    [provider]  nitroGLYCERIN (NITROSTAT) 0.4 MG SL tablet Place 1 tablet (0.4 mg total) under the tongue every 5 (five) minutes as needed for chest pain. 07/31/17   [name], MD  pantoprazole (PROTONIX) 40 MG tablet Take 1 tablet (40 mg total) by mouth daily. 06/20/17   [name], MD  polyethylene glycol [facility] / Floria Raveling) packet Take 17 g by mouth daily. Patient taking differently: Take 17 g by mouth daily as needed for mild constipation.  09/11/17   [name], [name], MD  sacubitril-valsartan (ENTRESTO) 49-51 MG Take 1 tablet by mouth 2 (two) times daily. 09/30/17   [name], FNP  spironolactone (ALDACTONE) 25 MG tablet Take 1 tablet (25 mg total) by mouth daily. 10/08/17   Wellington Hampshire, MD  tiotropium (SPIRIVA HANDIHALER) 18 MCG inhalation capsule Place 18 mcg daily into inhaler and inhale.  06/20/17   [provider]  torsemide (DEMADEX) 20 MG tablet Take 40mg  (2 tablets) in the morning and 20mg  (1 tablet) in the evening. 11/12/17   Wellington Hampshire, MD  traMADol (ULTRAM) 50 MG tablet Take 1 tablet (50 mg total) by mouth every 6 (six) hours as needed. Patient taking differently: Take 50 mg by mouth every 6 (six) hours as needed for moderate pain.  09/10/17 09/10/18  [name], MD    Allergies Lisinopril  Family History  Problem Relation Age of Onset  . Hypertension Mother   . Heart failure Mother   . Hypertension Father   . CAD Father   . Heart attack Father     Social History Social History   Tobacco Use  . Smoking status: Former Smoker    Packs/day: 0.25    Years: 20.00    Pack years: 5.00    Types: Cigarettes    Last attempt to quit: 02/20/2016    Years since  quitting: 1.8  . Smokeless tobacco: Never Used  Substance Use Topics  . Alcohol use: Yes    Alcohol/week: 0.0 oz    Comment: beer occasional  . Drug use: No    Review of Systems Constitutional: Positive for fever Eyes: No visual changes. ENT: No sore throat. Cardiovascular: Positive for chest pain. Respiratory: Positive for shortness of breath and cough. Gastrointestinal: No abdominal pain.  No nausea, no vomiting.  No diarrhea.   Genitourinary: Negative for dysuria. Musculoskeletal: Negative for back pain. Skin: Negative for rash. Neurological: Negative for headaches, focal weakness or numbness.  ____________________________________________   PHYSICAL EXAM:  VITAL SIGNS: ED Triage Vitals  Enc Vitals Group     BP 12/20/17 1324 (!) 154/94     Pulse Rate 12/20/17 1324 88     Resp 12/20/17 1324 (!) 22     Temp 12/20/17  1324 (!) 100.7 F (38.2 C)     Temp Source 12/20/17 1423 Oral     SpO2 12/20/17 1324 94 %     Weight 12/20/17 1322 250 lb (113.4 kg)     Height 12/20/17 1322 6\' 1"  (1.854 m)   Constitutional: Alert and oriented. Well appearing and in no distress. Eyes: Conjunctivae are normal.  ENT   Head: Normocephalic and atraumatic.   Nose: No congestion/rhinnorhea.   Mouth/Throat: Mucous membranes are moist.   Neck: No stridor. Hematological/Lymphatic/Immunilogical: No cervical lymphadenopathy. Cardiovascular: Normal rate, regular rhythm.  No murmurs, rubs, or gallops.  Respiratory: Tachypnea. Increased respiratory effort Gastrointestinal: Soft and non tender. No rebound. No guarding.  Genitourinary: Deferred Musculoskeletal: Normal range of motion in all extremities. No lower extremity edema. Neurologic:  Normal speech and language. No gross focal neurologic deficits are appreciated.  Skin:  Skin is warm, dry and intact. No rash noted. Psychiatric: Mood and affect are normal. Speech and behavior are normal. Patient exhibits appropriate insight and  judgment.  ____________________________________________    LABS (pertinent positives/negatives)  Influenza A positive CBC wbc 7.9, hgb 15.3, plt 142  ____________________________________________   EKG  I, Nance Pear, attending physician, personally viewed and interpreted this EKG  EKG Time: 1730 Rate: 104 Rhythm: sinus tachycardia Axis: left axis deviation Intervals: qtc 490 QRS: IVCD, LVH ST changes: repolarization abnormalities Impression: abnormal ekg ____________________________________________    RADIOLOGY  CXR Concerning for bilateral pneumonia.  I, Emmagrace Runkel, personally viewed and evaluated these images (plain radiographs) as part of my medical decision making. ____________________________________________   PROCEDURES  Procedures  ____________________________________________   INITIAL IMPRESSION / ASSESSMENT AND PLAN / ED COURSE  Pertinent labs & imaging results that were available during my care of the patient were reviewed by me and considered in my medical decision making (see chart for details).  Patient presented with multiple flu like symptoms. Work up is consistent with influenza a. Of concern was CXR which showed possible pneumonia. Given possible bacterial infection will start IV antibiotics and tamiflu. Will plan on admission to the hospitalist service. Discussed findings and plan with patient.   ____________________________________________   FINAL CLINICAL IMPRESSION(S) / ED DIAGNOSES  Final diagnoses:  Influenza  Cough  Community acquired pneumonia, unspecified laterality     Note: This dictation was prepared with Diplomatic Services operational officer dictation. Any transcriptional errors that result from this process are unintentional     Nance Pear, MD 12/20/17 1800

## 2017-12-20 NOTE — H&P (Addendum)
Stephen [name] NAME: Stephen [name]    MR#:  [id number]  DATE OF BIRTH:  1966-01-17  DATE OF ADMISSION:  12/20/2017  PRIMARY CARE PHYSICIAN: [name], MD   REQUESTING/REFERRING PHYSICIAN: Nance Pear, MD  CHIEF COMPLAINT:   Chief Complaint  Patient presents with  . Cough  . Influenza   Cough, sputum, wheezing and shortness of breath.  Days. HISTORY OF PRESENT ILLNESS:  Stephen [name]  is a 51 y.o. male with a known history of hypertension, CHF, COPD, non-STEMI.  The patient presented to the ED with above chief complaints.  He has had a bowel symptoms for the past 2 days.  Also, he complains of fever and chills and generalized weakness.  He denies any orthopnea or nocturnal dyspnea or leg edema.  Influenza test is positive for influenza A in the ED.  Chest x-ray show bilateral pneumonia.  PAST MEDICAL HISTORY:   Past Medical History:  Diagnosis Date  . CHF (congestive heart failure) (HCC)    nonischemic cardiomyopathy, EF 25%  . Hypertension   . TIA (transient ischemic attack) 06/21/16    PAST SURGICAL HISTORY:   Past Surgical History:  Procedure Laterality Date  . KNEE SURGERY Right   . LEFT HEART CATH AND CORONARY ANGIOGRAPHY N/A 09/09/2017   Procedure: LEFT HEART CATH AND CORONARY ANGIOGRAPHY;  Surgeon: Teodoro Spray, MD;  Location: [facility];  Service: Cardiovascular;  Laterality: N/A;  . VASECTOMY      SOCIAL HISTORY:   Social History   Tobacco Use  . Smoking status: Former Smoker    Packs/day: 0.25    Years: 20.00    Pack years: 5.00    Types: Cigarettes    Last attempt to quit: 02/20/2016    Years since quitting: 1.8  . Smokeless tobacco: Never Used  Substance Use Topics  . Alcohol use: Yes    Alcohol/week: 0.0 oz    Comment: beer occasional    FAMILY HISTORY:   Family History  Problem Relation Age of Onset  . Hypertension Mother   . Heart failure Mother   .  Hypertension Father   . CAD Father   . Heart attack Father     DRUG ALLERGIES:   Allergies  Allergen Reactions  . Lisinopril Cough    REVIEW OF SYSTEMS:   Review of Systems  Constitutional: Positive for chills, fever and malaise/fatigue.  HENT: Negative for sore throat.   Eyes: Negative for blurred vision and double vision.  Respiratory: Positive for cough, sputum production, shortness of breath and wheezing. Negative for hemoptysis and stridor.   Cardiovascular: Negative for chest pain, palpitations, orthopnea and leg swelling.  Gastrointestinal: Negative for abdominal pain, blood in stool, diarrhea, melena, nausea and vomiting.  Genitourinary: Negative for dysuria, flank pain and hematuria.  Musculoskeletal: Negative for back pain and joint pain.  Skin: Negative for rash.  Neurological: Positive for weakness. Negative for dizziness, sensory change, focal weakness, seizures, loss of consciousness and headaches.  Endo/Heme/Allergies: Negative for polydipsia.  Psychiatric/Behavioral: Negative for depression. The patient is not nervous/anxious.     MEDICATIONS AT HOME:   Prior to Admission medications   Medication Sig Start Date End Date Taking? Authorizing Provider  atorvastatin (LIPITOR) 40 MG tablet Take 1 tablet (40 mg total) by mouth daily at 6 PM. Patient taking differently: Take 40 mg by mouth daily.  06/22/16  Yes [name], MD  carvedilol (COREG) 6.25 MG tablet Take 1 tablet (  6.25 mg total) 2 (two) times daily by mouth. 11/12/17  Yes Wellington Hampshire, MD  clopidogrel (PLAVIX) 75 MG tablet Take 1 tablet (75 mg total) by mouth daily. 06/22/16  Yes [name], MD  cyclobenzaprine (FLEXERIL) 10 MG tablet Take 1 tablet (10 mg total) by mouth 3 (three) times daily as needed for muscle spasms. 09/10/17  Yes [name], [name], MD  ivabradine (CORLANOR) 5 MG TABS tablet Take 1 tablet (5 mg total) by mouth 2 (two) times daily with a meal. 10/08/17  Yes Wellington Hampshire,  MD  pantoprazole (PROTONIX) 40 MG tablet Take 1 tablet (40 mg total) by mouth daily. 06/20/17  Yes [name], MD  sacubitril-valsartan (ENTRESTO) 49-51 MG Take 1 tablet by mouth 2 (two) times daily. 09/30/17  Yes Hackney, Otila Kluver A, FNP  spironolactone (ALDACTONE) 25 MG tablet Take 1 tablet (25 mg total) by mouth daily. 10/08/17  Yes Wellington Hampshire, MD  tiotropium (SPIRIVA HANDIHALER) 18 MCG inhalation capsule Place 18 mcg daily into inhaler and inhale.  06/20/17  Yes [provider]  torsemide (DEMADEX) 20 MG tablet Take 40mg  (2 tablets) in the morning and 20mg  (1 tablet) in the evening. Patient taking differently: Take 80 mg by mouth daily. Take 40mg  (2 tablets) in the morning and 20mg  (1 tablet) in the evening. 11/12/17  Yes Wellington Hampshire, MD  traMADol (ULTRAM) 50 MG tablet Take 1 tablet (50 mg total) by mouth every 6 (six) hours as needed. Patient taking differently: Take 50 mg by mouth every 6 (six) hours as needed for moderate pain.  09/10/17 09/10/18 Yes [name], [name], MD  acetaminophen (TYLENOL) 500 MG tablet Take 1 capsule by mouth every 4 (four) hours as needed for fever.    [provider]  albuterol (PROVENTIL HFA;VENTOLIN HFA) 108 (90 Base) MCG/ACT inhaler Inhale 2 puffs into the lungs every 4 (four) hours as needed for wheezing or shortness of breath. 03/22/17   [name], MD  amiodarone (PACERONE) 200 MG tablet Take 2 tablets (400 mg) by mouth twice daily x 2 weeks, then take 2 tablets (400 mg) by mouth once daily x 2 weeks Patient not taking: Reported on 12/20/2017 12/19/17   Deboraha Sprang, MD  amiodarone (PACERONE) 200 MG tablet Take 1 tablet (200 mg total) by mouth daily. Patient not taking: Reported on 12/20/2017 12/19/17   Deboraha Sprang, MD  Camphor-Eucalyptus-Menthol (CHEST RUB) 4.8-1.2-2.6 % OINT Apply 1 application topically daily as needed (congestino).     [provider]  diclofenac sodium (VOLTAREN) 1 % GEL Apply 2 g topically 4 (four)  times daily as needed (pain).     [provider]  docusate sodium (COLACE) 100 MG capsule Take 1 capsule (100 mg total) by mouth 2 (two) times daily as needed for mild constipation. 09/10/17   [name], [name], MD  fluticasone (FLONASE) 50 MCG/ACT nasal spray Place 2 sprays into both nostrils daily. Patient taking differently: Place 2 sprays into both nostrils daily as needed for allergies.  06/20/17   [name], MD  Fluticasone-Salmeterol (ADVAIR DISKUS) 250-50 MCG/DOSE AEPB Inhale 1 puff into the lungs 2 (two) times daily. Patient not taking: Reported on 12/20/2017 11/25/17 11/25/18  [name], MD  nitroGLYCERIN (NITROSTAT) 0.4 MG SL tablet Place 1 tablet (0.4 mg total) under the tongue every 5 (five) minutes as needed for chest pain. 07/31/17   [name], MD  polyethylene glycol [facility] / Floria Raveling) packet Take 17 g by mouth daily. Patient taking differently: Take 17 g  by mouth daily as needed for mild constipation.  09/11/17   [name], MD      VITAL SIGNS:  Blood pressure 121/90, pulse 100, temperature 99.8 F (37.7 C), temperature source Oral, resp. rate (!) 44, height 6\' 1"  (1.854 m), weight 250 lb (113.4 kg), SpO2 92 %.  PHYSICAL EXAMINATION:  Physical Exam  GENERAL:  51 y.o.-year-old patient lying in the bed with no acute distress.  EYES: Pupils equal, round, reactive to light and accommodation. No scleral icterus. Extraocular muscles intact.  HEENT: Head atraumatic, normocephalic. Oropharynx and nasopharynx clear.  NECK:  Supple, no jugular venous distention. No thyroid enlargement, no tenderness.  LUNGS: Normal breath sounds bilaterally, no wheezing,no rhonchi but has crackles. No use of accessory muscles of respiration.  CARDIOVASCULAR: S1, S2 normal. No murmurs, rubs, or gallops.  ABDOMEN: Soft, nontender, nondistended. Bowel sounds present. No organomegaly or mass.  EXTREMITIES: No pedal edema, cyanosis, or clubbing.  NEUROLOGIC: Cranial nerves II through XII  are intact. Muscle strength 5/5 in all extremities. Sensation intact. Gait not checked.  PSYCHIATRIC: The patient is alert and oriented x 3.  SKIN: No obvious rash, lesion, or ulcer.   LABORATORY PANEL:   CBC Recent Labs  Lab 12/20/17 1438  WBC 7.9  HGB 15.3  HCT 46.1  PLT 142*   ------------------------------------------------------------------------------------------------------------------  Chemistries  Recent Labs  Lab 12/20/17 1438  NA 136  K 3.4*  CL 100*  CO2 22  GLUCOSE 127*  BUN 22*  CREATININE 1.41*  CALCIUM 9.2  AST 40  ALT 20  ALKPHOS 55  BILITOT 1.9*   ------------------------------------------------------------------------------------------------------------------  Cardiac Enzymes No results for input(s): TROPONINI in the last 168 hours. ------------------------------------------------------------------------------------------------------------------  RADIOLOGY:  Dg Chest 2 View  Result Date: 12/20/2017 CLINICAL DATA:  Cough.  Body aches.  Fevers. EXAM: CHEST  2 VIEW COMPARISON:  CT 09/28/2017.  Chest x-ray 920 08/07/2017. FINDINGS: Severe cardiomegaly with pulmonary vascular prominence and bilateral interstitial prominence and bibasilar infiltrates. These findings suggest congestive heart failure with bilateral pulmonary edema. Bilateral pneumonia cannot be excluded . IMPRESSION: Severe cardiomegaly. Pulmonary venous congestion with bilateral interstitial prominence and bibasilar alveolar infiltrates. These findings suggest congestive heart failure with pulmonary edema. Bilateral pneumonia cannot be excluded. Electronically Signed   By: Marcello Moores  Register   On: 12/20/2017 15:13      IMPRESSION AND PLAN:   Bilateral pneumonia, CAP. The patient will be admitted to medical floor.  Continue Zithromax and Rocephin, follow-up cultures and CBC.  Robitussin as needed.  Influenza A.  Continue Tamiflu.  Hypokalemia.  Give potassium supplement and follow-up  level.  History of chronic systolic CHF LV EF: 18% -   25%.  Stable.  Continue torsemide, spironolactone and Entresto.  COPD.  Stable.  NEB PRN.  Hypertension.  Continue home hypertension medication.  History of non-STEMI.  Continue Plavix and Lipitor.  CKD stage III.  Stable.  Tobacco abuse.  Smoking cessation was counseled for 3-4 minutes.  Hypertension.  Continue home hypertension medication. All the records are reviewed and case discussed with ED provider. Management plans discussed with the patient, family and they are in agreement.  CODE STATUS: Full code  TOTAL TIME TAKING CARE OF THIS PATIENT: 57 minutes.    [name] M.D on 12/20/2017 at 6:27 PM  Between 7am to 6pm - Pager - [phone]  After 6pm go to [email or web address] - Proofreader  [facility]  Office  [phone]  CC: Primary care physician; [name], MD   Note:  This dictation was prepared with Dragon dictation along with smaller phrase technology. Any transcriptional errors that result from this process are unin

## 2017-12-20 NOTE — ED Triage Notes (Signed)
Pt c/o cough, body aches, fevers for 2 days. 100.7 in triage.

## 2017-12-20 NOTE — Progress Notes (Signed)
Patient seen due to respiratory distress. Found patient on nrb mask with increased wob. BBS diminished but clear. HR 117 RR 26-30. ABG obtained. SVN given. Patient tolerated all interventions well. Will continue to monitor

## 2017-12-20 NOTE — ED Notes (Signed)
Patient transported to XR. 

## 2017-12-21 DIAGNOSIS — I5023 Acute on chronic systolic (congestive) heart failure: Secondary | ICD-10-CM

## 2017-12-21 DIAGNOSIS — I493 Ventricular premature depolarization: Secondary | ICD-10-CM

## 2017-12-21 LAB — CBC
HCT: 43.2 % (ref 40.0–52.0)
HEMOGLOBIN: 14.1 g/dL (ref 13.0–18.0)
MCH: 29.7 pg (ref 26.0–34.0)
MCHC: 32.6 g/dL (ref 32.0–36.0)
MCV: 91 fL (ref 80.0–100.0)
PLATELETS: 120 10*3/uL — AB (ref 150–440)
RBC: 4.74 MIL/uL (ref 4.40–5.90)
RDW: 15.2 % — ABNORMAL HIGH (ref 11.5–14.5)
WBC: 6.1 10*3/uL (ref 3.8–10.6)

## 2017-12-21 LAB — BASIC METABOLIC PANEL
Anion gap: 10 (ref 5–15)
BUN: 28 mg/dL — ABNORMAL HIGH (ref 6–20)
CHLORIDE: 101 mmol/L (ref 101–111)
CO2: 23 mmol/L (ref 22–32)
CREATININE: 1.5 mg/dL — AB (ref 0.61–1.24)
Calcium: 8.8 mg/dL — ABNORMAL LOW (ref 8.9–10.3)
GFR calc non Af Amer: 52 mL/min — ABNORMAL LOW (ref >60)
GLUCOSE: 140 mg/dL — AB (ref 65–99)
Potassium: 3.4 mmol/L — ABNORMAL LOW (ref 3.5–5.1)
Sodium: 134 mmol/L — ABNORMAL LOW (ref 135–145)

## 2017-12-21 LAB — STREP PNEUMONIAE URINARY ANTIGEN: STREP PNEUMO URINARY ANTIGEN: NEGATIVE

## 2017-12-21 LAB — PROCALCITONIN: PROCALCITONIN: 1.18 ng/mL

## 2017-12-21 MED ORDER — PNEUMOCOCCAL VAC POLYVALENT 25 MCG/0.5ML IJ INJ: 0.5000 mL | INJECTION | INTRAMUSCULAR | Status: DC

## 2017-12-21 MED ORDER — MIDODRINE HCL 5 MG PO TABS
5.0000 mg | ORAL_TABLET | Freq: Three times a day (TID) | ORAL | Status: DC
  Administered 2017-12-21 – 2017-12-22 (×2): 5 mg via ORAL
  Filled 2017-12-21 (×3): qty 1

## 2017-12-21 MED ORDER — AMIODARONE HCL 200 MG PO TABS
400.0000 mg | ORAL_TABLET | Freq: Two times a day (BID) | ORAL | Status: DC
  Administered 2017-12-21 – 2017-12-22 (×4): 400 mg via ORAL
  Filled 2017-12-21 (×4): qty 2

## 2017-12-21 MED ORDER — CARVEDILOL 3.125 MG PO TABS
3.1250 mg | ORAL_TABLET | Freq: Two times a day (BID) | ORAL | Status: DC
  Administered 2017-12-21 – 2017-12-22 (×3): 3.125 mg via ORAL
  Filled 2017-12-21 (×3): qty 1

## 2017-12-21 MED ORDER — POTASSIUM CHLORIDE CRYS ER 20 MEQ PO TBCR
20.0000 meq | EXTENDED_RELEASE_TABLET | Freq: Once | ORAL | Status: AC
  Administered 2017-12-21: 20 meq via ORAL
  Filled 2017-12-21: qty 1

## 2017-12-21 MED ORDER — LOSARTAN POTASSIUM 50 MG PO TABS
25.0000 mg | ORAL_TABLET | Freq: Every day | ORAL | Status: DC
  Administered 2017-12-22: 25 mg via ORAL
  Filled 2017-12-21: qty 1

## 2017-12-21 MED ORDER — INFLUENZA VAC SPLIT QUAD 0.5 ML IM SUSY: 0.5000 mL | PREFILLED_SYRINGE | INTRAMUSCULAR | Status: DC

## 2017-12-21 NOTE — Progress Notes (Signed)
Per Dr. Posey Pronto hold all BP meds this AM including fluid pill and IV lasix. Also hold Heart failure medications as well due to low BP.

## 2017-12-21 NOTE — Progress Notes (Signed)
Dr salary notified lactic acid 2.7, stated he would round on pt

## 2017-12-21 NOTE — Progress Notes (Signed)
[place] [facility]                                                                                                                                                                                  Patient Demographics   Stephen [name], is a 51 y.o. male, DOB - 01-04-1966, ONG:[id number]  Admit date - 12/20/2017   Admitting Physician [name], MD  Outpatient Primary MD for the patient is [name], MD   LOS - 1  Subjective: Patient admitted with shortness of breath and also noticed to have flu    Review of Systems:   CONSTITUTIONAL: No documented fever. No fatigue, weakness. No weight gain, no weight loss.  EYES: No blurry or double vision.  ENT: No tinnitus. No postnasal drip. No redness of the oropharynx.  RESPIRATORY: No cough, no wheeze, no hemoptysis. + dyspnea.  CARDIOVASCULAR: No chest pain. No orthopnea. No palpitations. No syncope.  GASTROINTESTINAL: No nausea, no vomiting or diarrhea. No abdominal pain. No melena or hematochezia.  GENITOURINARY: No dysuria or hematuria.  ENDOCRINE: No polyuria or nocturia. No heat or cold intolerance.  HEMATOLOGY: No anemia. No bruising. No bleeding.  INTEGUMENTARY: No rashes. No lesions.  MUSCULOSKELETAL: No arthritis. No swelling. No gout.  NEUROLOGIC: No numbness, tingling, or ataxia. No seizure-type activity.  PSYCHIATRIC: No anxiety. No insomnia. No ADD.    Vitals:   Vitals:   12/21/17 1004 12/21/17 1122 12/21/17 1309 12/21/17 1312  BP:  99/70 (!) 152/136 111/78  Pulse: 85   89  Resp:   (!) 26   Temp:   98.7 F (37.1 C)   TempSrc:   Oral   SpO2: 100% 98%  98%  Weight:      Height:        Wt Readings from Last 3 Encounters:  12/20/17 250 lb (113.4 kg)  12/19/17 252 lb (114.3 kg)  11/12/17 251 lb 8 oz (114.1 kg)     Intake/Output Summary (Last 24 hours) at 12/21/2017 1501 Last data filed at 12/21/2017 1153 Gross per 24 hour  Intake 1470 ml  Output 1560 ml  Net -90 ml     Physical Exam:   GENERAL: Pleasant-appearing in no apparent distress.  HEAD, EYES, EARS, NOSE AND THROAT: Atraumatic, normocephalic. Extraocular muscles are intact. Pupils equal and reactive to light. Sclerae anicteric. No conjunctival injection. No oro-pharyngeal erythema.  NECK: Supple. There is no jugular venous distention. No bruits, no lymphadenopathy, no thyromegaly.  HEART: Regular rate and rhythm,. No murmurs, no rubs, no clicks.  LUNGS: Bilateral crackles both lungs ABDOMEN: Soft, flat, nontender, nondistended. Has good bowel sounds. No hepatosplenomegaly appreciated.  EXTREMITIES: No evidence of any  cyanosis, clubbing, or  2+ peripheral edema.  +2 pedal and radial pulses bilaterally.  NEUROLOGIC: The patient is alert, awake, and oriented x3 with no focal motor or sensory deficits appreciated bilaterally.  SKIN: Moist and warm with no rashes appreciated.  Psych: Not anxious, depressed LN: No inguinal LN enlargement    Antibiotics   Anti-infectives (From admission, onward)   Start     Dose/Rate Route Frequency Ordered Stop   12/21/17 1800  azithromycin (ZITHROMAX) 500 mg in dextrose 5 % 250 mL IVPB     500 mg 250 mL/hr over 60 Minutes Intravenous Every 24 hours 12/20/17 1851 12/28/17 1759   12/21/17 1000  cefTRIAXone (ROCEPHIN) 1 g in dextrose 5 % 50 mL IVPB     1 g 100 mL/hr over 30 Minutes Intravenous Every 24 hours 12/20/17 1851 12/28/17 0959   12/21/17 1000  oseltamivir (TAMIFLU) capsule 75 mg     75 mg Oral 2 times daily 12/20/17 1851     12/20/17 1545  oseltamivir (TAMIFLU) capsule 75 mg     75 mg Oral  Once 12/20/17 1544 12/20/17 1603   12/20/17 1545  cefTRIAXone (ROCEPHIN) 1 g in dextrose 5 % 50 mL IVPB - Premix     1 g 100 mL/hr over 30 Minutes Intravenous  Once 12/20/17 1544 12/20/17 1702   12/20/17 1545  azithromycin (ZITHROMAX) 500 mg in dextrose 5 % 250 mL IVPB     500 mg 250 mL/hr over 60 Minutes Intravenous  Once 12/20/17 1544 12/20/17 1756       Medications   Scheduled Meds: . amiodarone  400 mg Oral BID  . atorvastatin  40 mg Oral Daily  . carvedilol  3.125 mg Oral BID  . clopidogrel  75 mg Oral Daily  . furosemide  40 mg Intravenous BID  . heparin  5,000 Units Subcutaneous Q8H  . [START ON 12/22/2017] Influenza vac split quadrivalent PF  0.5 mL Intramuscular Tomorrow-1000  . ipratropium-albuterol  3 mL Nebulization QID  . ivabradine  5 mg Oral BID WC  . [START ON 12/22/2017] losartan  25 mg Oral Daily  . midodrine  5 mg Oral TID WC  . oseltamivir  75 mg Oral BID  . pantoprazole  40 mg Oral Daily  . [START ON 12/22/2017] pneumococcal 23 valent vaccine  0.5 mL Intramuscular Tomorrow-1000  . potassium chloride  20 mEq Oral Once  . sodium chloride flush  3 mL Intravenous Q12H  . tiotropium  18 mcg Inhalation Daily   Continuous Infusions: . sodium chloride    . azithromycin    . cefTRIAXone (ROCEPHIN)  IV Stopped (12/21/17 1153)   PRN Meds:.sodium chloride, acetaminophen **OR** acetaminophen, albuterol, bisacodyl, cyclobenzaprine, diclofenac sodium, docusate sodium, fluticasone, guaiFENesin, nitroGLYCERIN, ondansetron **OR** ondansetron (ZOFRAN) IV, polyethylene glycol, sodium chloride flush, traMADol   Data Review:   Micro Results Recent Results (from the past 240 hour(s))  Blood culture (routine x 2)     Status: None (Preliminary result)   Collection Time: 12/20/17  2:39 PM  Result Value Ref Range Status   Specimen Description BLOOD Blood Culture adequate volume  Final   Special Requests BLOOD LEFT FOREARM  Final   Culture   Final    NO GROWTH < 24 HOURS Performed at [facility], [address] [place], [place] [address]    Report Status PENDING  Incomplete  Blood culture (routine x 2)     Status: None (Preliminary result)   Collection Time: 12/20/17  2:39 PM  Result Value Ref Range Status   Specimen Description BLOOD Blood Culture adequate volume  Final   Special Requests BLOOD RIGHT FOREARM   Final   Culture   Final    NO GROWTH < 24 HOURS Performed at [facility], [address] [place], [place] [address]    Report Status PENDING  Incomplete    Radiology Reports Dg Chest 2 View  Result Date: 12/20/2017 CLINICAL DATA:  Cough.  Body aches.  Fevers. EXAM: CHEST  2 VIEW COMPARISON:  CT 09/28/2017.  Chest x-ray 920 08/07/2017. FINDINGS: Severe cardiomegaly with pulmonary vascular prominence and bilateral interstitial prominence and bibasilar infiltrates. These findings suggest congestive heart failure with bilateral pulmonary edema. Bilateral pneumonia cannot be excluded . IMPRESSION: Severe cardiomegaly. Pulmonary venous congestion with bilateral interstitial prominence and bibasilar alveolar infiltrates. These findings suggest congestive heart failure with pulmonary edema. Bilateral pneumonia cannot be excluded. Electronically Signed   By: Marcello Moores  Register   On: 12/20/2017 15:13     CBC Recent Labs  Lab 12/20/17 1438 12/21/17 0347  WBC 7.9 6.1  HGB 15.3 14.1  HCT 46.1 43.2  PLT 142* 120*  MCV 89.9 91.0  MCH 29.7 29.7  MCHC 33.1 32.6  RDW 15.2* 15.2*  LYMPHSABS 0.4*  --   MONOABS 0.6  --   EOSABS 0.0  --   BASOSABS 0.0  --     Chemistries  Recent Labs  Lab 12/20/17 1438 12/21/17 0347  NA 136 134*  K 3.4* 3.4*  CL 100* 101  CO2 22 23  GLUCOSE 127* 140*  BUN 22* 28*  CREATININE 1.41* 1.50*  CALCIUM 9.2 8.8*  AST 40  --   ALT 20  --   ALKPHOS 55  --   BILITOT 1.9*  --    ------------------------------------------------------------------------------------------------------------------ estimated creatinine clearance is 76 mL/min (A) (by C-G formula based on SCr of 1.5 mg/dL (H)). ------------------------------------------------------------------------------------------------------------------ No results for input(s): HGBA1C in the last 72  hours. ------------------------------------------------------------------------------------------------------------------ No results for input(s): CHOL, HDL, LDLCALC, TRIG, CHOLHDL, LDLDIRECT in the last 72 hours. ------------------------------------------------------------------------------------------------------------------ No results for input(s): TSH, T4TOTAL, T3FREE, THYROIDAB in the last 72 hours.  Invalid input(s): FREET3 ------------------------------------------------------------------------------------------------------------------ No results for input(s): VITAMINB12, FOLATE, FERRITIN, TIBC, IRON, RETICCTPCT in the last 72 hours.  Coagulation profile No results for input(s): INR, PROTIME in the last 168 hours.  No results for input(s): DDIMER in the last 72 hours.  Cardiac Enzymes No results for input(s): CKMB, TROPONINI, MYOGLOBIN in the last 168 hours.  Invalid input(s): CK ------------------------------------------------------------------------------------------------------------------ Invalid input(s): POCBNP    Assessment & Plan  Pt is 51 y.o with history of systolic CHF presenting with shortness of breath  #1 acute respiratory failure due to combination of acute CHF and flu  #2 acute on chronic systolic CHF: IV lasix  Continue Coreg and Lasix Patient's medications have been adjusted by cardiology Low blood pressure we will start patient on midodrine  #3.  Possible pneumonia: I have check pro-calcitonin Treat with azithromycin and Rocephin  #4.  Influenza A continue continue Tamiflu  #5 hypokalemia replace K+  #6.  Hypotension I will start patient on midodrine for now to allow [place] to diurese him better   #7 history of non-STEMI.  Continue Plavix and Lipitor.  #8 CKD stage III.  Stable.  #9 tobacco abuse.  Smoking cessation was provided yesterday      Code Status Orders  (From admission, onward)        Start     Ordered  12/20/17 1852  Full  code  Continuous     12/20/17 1851    Code Status History    Date Active Date Inactive Code Status Order ID Comments User Context   09/08/2017 06:44 09/10/2017 21:27 Full Code [id number]  Harrie Foreman, MD Inpatient   07/29/2017 10:44 07/31/2017 21:52 Full Code [id number]  Henreitta Leber, MD Inpatient   06/19/2017 00:27 06/20/2017 21:14 Full Code [id number]  Lance Coon, MD ED   05/20/2017 12:26 05/21/2017 17:59 Full Code [id number]  [name], MD Inpatient   06/22/2016 04:30 06/22/2016 21:50 Full Code [id number]  Quintella Baton, MD Inpatient   06/04/2016 18:43 06/06/2016 18:15 Full Code [id number]  [name], MD Inpatient           Consults  cardiology   DVT Prophylaxis  Lovenox    Lab Results  Component Value Date   PLT 120 (L) 12/21/2017     Time Spent in minutes 26min Greater than 50% of time spent in care coordination and counseling patient regarding the condition and plan of care.   [name] M.D on 12/21/2017 at 3:01 PM  Between 7am to 6pm - Pager - [phone]  After 6pm go to [email or web address] - password [email or web address] [facility]   Office  [phone]

## 2017-12-21 NOTE — Consult Note (Signed)
Cardiology Consultation:   [name] ID: NEO [id number]; [id number]; [birth year]   Admit date: 12/20/2017 Date of Consult: 12/21/2017  Primary Care Provider: [name], MD Primary Cardiologist: Fletcher Anon Primary Electrophysiologist:  Caryl Comes   [name] is a 51 y.o. male with a hx of chronic systolic heart failure due to nonischemic cardiomyopathy who is being seen today for Stephen evaluation of decompensated heart failure at Stephen request of Dr. Posey Pronto.  History of Present Illness:   Stephen [name] is a 51 year old African-American male who is well-known to me.  Stephen [name] has known history of chronic systolic heart failure with ejection fraction of 15-20% due to nonischemic cardiomyopathy.  Cardiac catheterization in September showed normal coronary arteries.  Stephen [name] had TIA in 2017 currently on Plavix.  Stephen [name] is a previous smoker.  Stephen [name] does have chronic kidney disease.  Stephen [name] has been managed medically for his heart failure with gradual up titration of his medications.  Stephen [name] was seen recently by [name] due to frequent PVCs and Stephen [name] suggested initiation of amiodarone.  This has not started yet. Stephen [name] presented with low-grade fever, cough and fatigue with worsening shortness of breath.  Stephen [name] was found to have Stephen flu.  Stephen [name] had worsening of shortness of breath and orthopnea overnight and was given 1 dose of IV furosemide 40 mg.  Stephen [name] reports improvement now in symptoms.  His blood pressure was low this morning and his heart failure medications were held.   Past Medical History:  Diagnosis Date  . CHF (congestive heart failure) (HCC)    nonischemic cardiomyopathy, EF 25%  . Hypertension   . TIA (transient ischemic attack) 06/21/16    Past Surgical History:  Procedure Laterality Date  . KNEE SURGERY Right   . LEFT HEART CATH AND CORONARY ANGIOGRAPHY N/A 09/09/2017   Procedure: LEFT HEART CATH AND CORONARY ANGIOGRAPHY;  Surgeon: Teodoro Spray, MD;  Location: [facility];  Service: Cardiovascular;  Laterality: N/A;  . VASECTOMY       Home Medications:  Prior to Admission medications   Medication Sig Start Date End Date Taking? Authorizing Provider  atorvastatin (LIPITOR) 40 MG tablet Take 1 tablet (40 mg total) by mouth daily at 6 PM. [name] taking differently: Take 40 mg by mouth daily.  06/22/16  Yes [name], MD  carvedilol (COREG) 6.25 MG tablet Take 1 tablet (6.25 mg total) 2 (two) times daily by mouth. 11/12/17  Yes Wellington Hampshire, MD  clopidogrel (PLAVIX) 75 MG tablet Take 1 tablet (75 mg total) by mouth daily. 06/22/16  Yes [name], MD  cyclobenzaprine (FLEXERIL) 10 MG tablet Take 1 tablet (10 mg total) by mouth 3 (three) times daily as needed for muscle spasms. 09/10/17  Yes [name], [name], MD  ivabradine (CORLANOR) 5 MG TABS tablet Take 1 tablet (5 mg total) by mouth 2 (two) times daily with a meal. 10/08/17  Yes Wellington Hampshire, MD  pantoprazole (PROTONIX) 40 MG tablet Take 1 tablet (40 mg total) by mouth daily. 06/20/17  Yes [name], MD  sacubitril-valsartan (ENTRESTO) 49-51 MG Take 1 tablet by mouth 2 (two) times daily. 09/30/17  Yes Hackney, Otila Kluver A, FNP  spironolactone (ALDACTONE) 25 MG tablet Take 1 tablet (25 mg total) by mouth daily. 10/08/17  Yes Wellington Hampshire, MD  tiotropium (SPIRIVA HANDIHALER) 18 MCG inhalation capsule Place 18 mcg daily into inhaler and inhale.  06/20/17  Yes [provider]  torsemide (DEMADEX) 20 MG tablet Take  40mg  (2 tablets) in Stephen morning and 20mg  (1 tablet) in Stephen evening. [name] taking differently: Take 80 mg by mouth daily. Take 40mg  (2 tablets) in Stephen morning and 20mg  (1 tablet) in Stephen evening. 11/12/17  Yes Wellington Hampshire, MD  traMADol (ULTRAM) 50 MG tablet Take 1 tablet (50 mg total) by mouth every 6 (six) hours as needed. [name] taking differently: Take 50 mg by mouth every 6 (six) hours as needed for moderate pain.  09/10/17 09/10/18 Yes [name], [name], MD    acetaminophen (TYLENOL) 500 MG tablet Take 1 capsule by mouth every 4 (four) hours as needed for fever.    [provider]  albuterol (PROVENTIL HFA;VENTOLIN HFA) 108 (90 Base) MCG/ACT inhaler Inhale 2 puffs into Stephen lungs every 4 (four) hours as needed for wheezing or shortness of breath. 03/22/17   [name], MD  amiodarone (PACERONE) 200 MG tablet Take 2 tablets (400 mg) by mouth twice daily x 2 weeks, then take 2 tablets (400 mg) by mouth once daily x 2 weeks [name] not taking: Reported on 12/20/2017 12/19/17   Deboraha Sprang, MD  amiodarone (PACERONE) 200 MG tablet Take 1 tablet (200 mg total) by mouth daily. [name] not taking: Reported on 12/20/2017 12/19/17   Deboraha Sprang, MD  Camphor-Eucalyptus-Menthol (CHEST RUB) 4.8-1.2-2.6 % OINT Apply 1 application topically daily as needed (congestino).     [provider]  diclofenac sodium (VOLTAREN) 1 % GEL Apply 2 g topically 4 (four) times daily as needed (pain).     [provider]  docusate sodium (COLACE) 100 MG capsule Take 1 capsule (100 mg total) by mouth 2 (two) times daily as needed for mild constipation. 09/10/17   [name], [name], MD  fluticasone (FLONASE) 50 MCG/ACT nasal spray Place 2 sprays into both nostrils daily. [name] taking differently: Place 2 sprays into both nostrils daily as needed for allergies.  06/20/17   [name], MD  Fluticasone-Salmeterol (ADVAIR DISKUS) 250-50 MCG/DOSE AEPB Inhale 1 puff into Stephen lungs 2 (two) times daily. [name] not taking: Reported on 12/20/2017 11/25/17 11/25/18  [name], MD  nitroGLYCERIN (NITROSTAT) 0.4 MG SL tablet Place 1 tablet (0.4 mg total) under Stephen tongue every 5 (five) minutes as needed for chest pain. 07/31/17   [name], MD  polyethylene glycol [facility] / Floria Raveling) packet Take 17 g by mouth daily. [name] taking differently: Take 17 g by mouth daily as needed for mild constipation.  09/11/17   [name], MD    Inpatient  Medications: Scheduled Meds: . atorvastatin  40 mg Oral Daily  . carvedilol  3.125 mg Oral BID  . clopidogrel  75 mg Oral Daily  . furosemide  40 mg Intravenous BID  . heparin  5,000 Units Subcutaneous Q8H  . [START ON 12/22/2017] Influenza vac split quadrivalent PF  0.5 mL Intramuscular Tomorrow-1000  . ipratropium-albuterol  3 mL Nebulization QID  . ivabradine  5 mg Oral BID WC  . [START ON 12/22/2017] losartan  25 mg Oral Daily  . oseltamivir  75 mg Oral BID  . pantoprazole  40 mg Oral Daily  . [START ON 12/22/2017] pneumococcal 23 valent vaccine  0.5 mL Intramuscular Tomorrow-1000  . sodium chloride flush  3 mL Intravenous Q12H  . tiotropium  18 mcg Inhalation Daily   Continuous Infusions: . sodium chloride    . azithromycin    . cefTRIAXone (ROCEPHIN)  IV Stopped (12/21/17 1153)   PRN Meds: sodium chloride, acetaminophen **OR** acetaminophen, albuterol, bisacodyl, cyclobenzaprine, diclofenac  sodium, docusate sodium, fluticasone, guaiFENesin, nitroGLYCERIN, ondansetron **OR** ondansetron (ZOFRAN) IV, polyethylene glycol, sodium chloride flush, traMADol  Allergies:    Allergies  Allergen Reactions  . Lisinopril Cough    Social History:   Social History   Socioeconomic History  . Marital status: Single    Spouse name: Not on file  . Number of children: Not on file  . Years of education: Not on file  . Highest education level: Not on file  Social Needs  . Financial resource strain: Not on file  . Food insecurity - worry: Not on file  . Food insecurity - inability: Not on file  . Transportation needs - medical: Not on file  . Transportation needs - non-medical: Not on file  Occupational History  . Not on file  Tobacco Use  . Smoking status: Former Smoker    Packs/day: 0.25    Years: 20.00    Pack years: 5.00    Types: Cigarettes    Last attempt to quit: 02/20/2016    Years since quitting: 1.8  . Smokeless tobacco: Never Used  Substance and Sexual Activity  .  Alcohol use: Yes    Alcohol/week: 0.0 oz    Comment: beer occasional  . Drug use: No  . Sexual activity: Not on file  Other Topics Concern  . Not on file  Social History Narrative   Independent at baseline    Family History:    Family History  Problem Relation Age of Onset  . Hypertension Mother   . Heart failure Mother   . Hypertension Father   . CAD Father   . Heart attack Father      ROS:  Please see Stephen history of present illness.  ROS  All other ROS reviewed and negative.     Physical Exam/Data:   Vitals:   12/21/17 1004 12/21/17 1122 12/21/17 1309 12/21/17 1312  BP:  99/70 (!) 152/136 111/78  Pulse: 85   89  Resp:   (!) 26   Temp:   98.7 F (37.1 C)   TempSrc:   Oral   SpO2: 100% 98%  98%  Weight:      Height:        Intake/Output Summary (Last 24 hours) at 12/21/2017 1359 Last data filed at 12/21/2017 1153 Gross per 24 hour  Intake 1470 ml  Output 1560 ml  Net -90 ml   Filed Weights   12/20/17 1322  Weight: 250 lb (113.4 kg)   Body mass index is 32.98 kg/m.  General:  Well nourished, well developed, in mild respiratory distress HEENT: normal Lymph: no adenopathy Neck: Significant JVD Endocrine:  No thryomegaly Vascular: No carotid bruits; FA pulses 2+ bilaterally . Cardiac:  normal S1, S2; RRR; no murmur  Lungs: Bibasilar crackles Abd: soft, nontender, no hepatomegaly  Ext: Mild bilateral edema Musculoskeletal:  No deformities, BUE and BLE strength normal and equal Skin: warm and dry  Neuro:  CNs 2-12 intact, no focal abnormalities noted Psych:  Normal affect   EKG:  Stephen EKG was personally reviewed and demonstrates: Not performed Telemetry:  Telemetry was personally reviewed and demonstrates: Sinus rhythm with PVCs  Relevant CV Studies:   Laboratory Data:  Chemistry Recent Labs  Lab 12/20/17 1438 12/21/17 0347  NA 136 134*  K 3.4* 3.4*  CL 100* 101  CO2 22 23  GLUCOSE 127* 140*  BUN 22* 28*  CREATININE 1.41* 1.50*   CALCIUM 9.2 8.8*  GFRNONAA 56* 52*  GFRAA >60 >60  ANIONGAP  14 10    Recent Labs  Lab 12/20/17 1438  PROT 7.1  ALBUMIN 4.1  AST 40  ALT 20  ALKPHOS 55  BILITOT 1.9*   Hematology Recent Labs  Lab 12/20/17 1438 12/21/17 0347  WBC 7.9 6.1  RBC 5.13 4.74  HGB 15.3 14.1  HCT 46.1 43.2  MCV 89.9 91.0  MCH 29.7 29.7  MCHC 33.1 32.6  RDW 15.2* 15.2*  PLT 142* 120*   Cardiac EnzymesNo results for input(s): TROPONINI in Stephen last 168 hours. No results for input(s): TROPIPOC in Stephen last 168 hours.  BNPNo results for input(s): BNP, PROBNP in Stephen last 168 hours.  DDimer No results for input(s): DDIMER in Stephen last 168 hours.  Radiology/Studies:  Dg Chest 2 View  Result Date: 12/20/2017 CLINICAL DATA:  Cough.  Body aches.  Fevers. EXAM: CHEST  2 VIEW COMPARISON:  CT 09/28/2017.  Chest x-ray 920 08/07/2017. FINDINGS: Severe cardiomegaly with pulmonary vascular prominence and bilateral interstitial prominence and bibasilar infiltrates. These findings suggest congestive heart failure with bilateral pulmonary edema. Bilateral pneumonia cannot be excluded . IMPRESSION: Severe cardiomegaly. Pulmonary venous congestion with bilateral interstitial prominence and bibasilar alveolar infiltrates. These findings suggest congestive heart failure with pulmonary edema. Bilateral pneumonia cannot be excluded. Electronically Signed   By: Marcello Moores  Register   On: 12/20/2017 15:13    Assessment and Plan:   1. Acute on chronic systolic heart failure with severely reduced LV systolic function: Stephen [name] appears to be significantly volume overloaded in Stephen setting of Stephen flu.  This is Stephen likely culprit for his decompensation.  I agree with furosemide intravenously 40 mg twice daily.  I decreased Stephen dose of carvedilol to 3.125 mg twice daily.  I switch to Entresto to low-dose losartan given no blood pressure.  Stephen [name] reports improvement in symptoms after IV furosemide but if there is any decompensation,  we should have low threshold to transfer to Stephen ICU and initiating treatment with an inotrope and pressors which might be needed in order to get him over his current illness. 2. Frequent PVCs: I am going to start amiodarone as was recommended by [name] recently. 3. Influenza A: Continue treatment with Tamiflu. 4. Chronic kidney disease: Stable renal function. 5. Previous TIA: Continue treatment with Plavix.   For questions or updates, please contact [place] Please consult [email or web address] for contact info under Cardiology/STEMI.   Signed, Kathlyn Sacramento, MD  12/21/2017 1:59 PM

## 2017-12-21 NOTE — Progress Notes (Signed)
Dr Jerelyn Charles notified of pt's resp distress, vs, pt complaints. o2 placed on pt at 2l, orders given.

## 2017-12-21 NOTE — Progress Notes (Signed)
Notified Dr. Jerelyn Charles of 3 second run of SVT. Pt already followed by cardiology no new orders received.

## 2017-12-22 LAB — BASIC METABOLIC PANEL
ANION GAP: 10 (ref 5–15)
BUN: 38 mg/dL — ABNORMAL HIGH (ref 6–20)
CO2: 25 mmol/L (ref 22–32)
Calcium: 8.7 mg/dL — ABNORMAL LOW (ref 8.9–10.3)
Chloride: 99 mmol/L — ABNORMAL LOW (ref 101–111)
Creatinine, Ser: 1.63 mg/dL — ABNORMAL HIGH (ref 0.61–1.24)
GFR calc Af Amer: 54 mL/min — ABNORMAL LOW (ref >60)
GFR, EST NON AFRICAN AMERICAN: 47 mL/min — AB (ref >60)
GLUCOSE: 124 mg/dL — AB (ref 65–99)
POTASSIUM: 3.9 mmol/L (ref 3.5–5.1)
Sodium: 134 mmol/L — ABNORMAL LOW (ref 135–145)

## 2017-12-22 LAB — HIV ANTIBODY (ROUTINE TESTING W REFLEX): HIV Screen 4th Generation wRfx: NONREACTIVE

## 2017-12-22 LAB — EXPECTORATED SPUTUM ASSESSMENT W GRAM STAIN, RFLX TO RESP C

## 2017-12-22 LAB — EXPECTORATED SPUTUM ASSESSMENT W REFEX TO RESP CULTURE

## 2017-12-22 MED ORDER — MIDODRINE HCL 5 MG PO TABS
10.0000 mg | ORAL_TABLET | Freq: Three times a day (TID) | ORAL | Status: DC
  Administered 2017-12-22 (×2): 10 mg via ORAL
  Filled 2017-12-22 (×4): qty 2

## 2017-12-22 NOTE — Plan of Care (Signed)
Patient is still complaining of right lung pain and respirations are 20 and above.  Stephen [name]

## 2017-12-22 NOTE — Progress Notes (Signed)
[place] at [facility]                                                                                                                                                                                  Patient Demographics   Stephen [name], is a 51 y.o. male, DOB - August 28, 1966, QPY:[id number]  Admit date - 12/20/2017   Admitting Physician [name], MD  Outpatient Primary MD for the patient is [name], MD   LOS - 2  Subjective: Patient states that breathing somewhat better Having cough and congestion    Review of Systems:   CONSTITUTIONAL: No documented fever. No fatigue, weakness. No weight gain, no weight loss.  EYES: No blurry or double vision.  ENT: No tinnitus. No postnasal drip. No redness of the oropharynx.  RESPIRATORY: No cough, no wheeze, no hemoptysis. + dyspnea.  CARDIOVASCULAR: No chest pain. No orthopnea. No palpitations. No syncope.  GASTROINTESTINAL: No nausea, no vomiting or diarrhea. No abdominal pain. No melena or hematochezia.  GENITOURINARY: No dysuria or hematuria.  ENDOCRINE: No polyuria or nocturia. No heat or cold intolerance.  HEMATOLOGY: No anemia. No bruising. No bleeding.  INTEGUMENTARY: No rashes. No lesions.  MUSCULOSKELETAL: No arthritis. No swelling. No gout.  NEUROLOGIC: No numbness, tingling, or ataxia. No seizure-type activity.  PSYCHIATRIC: No anxiety. No insomnia. No ADD.    Vitals:   Vitals:   12/21/17 1955 12/22/17 0558 12/22/17 0719 12/22/17 1228  BP: 102/78 96/66  99/72  Pulse: 98 (!) 126  86  Resp: 20   16  Temp: 98.8 F (37.1 C) 98.1 F (36.7 C)  98.5 F (36.9 C)  TempSrc: Oral Oral  Oral  SpO2: 97% 98% 99% (!) 82%  Weight:      Height:        Wt Readings from Last 3 Encounters:  12/20/17 250 lb (113.4 kg)  12/19/17 252 lb (114.3 kg)  11/12/17 251 lb 8 oz (114.1 kg)     Intake/Output Summary (Last 24 hours) at 12/22/2017 1345 Last data filed at 12/22/2017 1115 Gross per 24 hour   Intake 483 ml  Output 1825 ml  Net -1342 ml    Physical Exam:   GENERAL: Pleasant-appearing in no apparent distress.  HEAD, EYES, EARS, NOSE AND THROAT: Atraumatic, normocephalic. Extraocular muscles are intact. Pupils equal and reactive to light. Sclerae anicteric. No conjunctival injection. No oro-pharyngeal erythema.  NECK: Supple. There is no jugular venous distention. No bruits, no lymphadenopathy, no thyromegaly.  HEART: Regular rate and rhythm,. No murmurs, no rubs, no clicks.  LUNGS: Bilateral crackles both lungs ABDOMEN: Soft, flat, nontender, nondistended. Has good bowel sounds. No hepatosplenomegaly appreciated.  EXTREMITIES:  No evidence of any cyanosis, clubbing, or  2+ peripheral edema.  +2 pedal and radial pulses bilaterally.  NEUROLOGIC: The patient is alert, awake, and oriented x3 with no focal motor or sensory deficits appreciated bilaterally.  SKIN: Moist and warm with no rashes appreciated.  Psych: Not anxious, depressed LN: No inguinal LN enlargement    Antibiotics   Anti-infectives (From admission, onward)   Start     Dose/Rate Route Frequency Ordered Stop   12/21/17 1800  azithromycin (ZITHROMAX) 500 mg in dextrose 5 % 250 mL IVPB     500 mg 250 mL/hr over 60 Minutes Intravenous Every 24 hours 12/20/17 1851 12/28/17 1759   12/21/17 1000  cefTRIAXone (ROCEPHIN) 1 g in dextrose 5 % 50 mL IVPB     1 g 100 mL/hr over 30 Minutes Intravenous Every 24 hours 12/20/17 1851 12/28/17 0959   12/21/17 1000  oseltamivir (TAMIFLU) capsule 75 mg     75 mg Oral 2 times daily 12/20/17 1851     12/20/17 1545  oseltamivir (TAMIFLU) capsule 75 mg     75 mg Oral  Once 12/20/17 1544 12/20/17 1603   12/20/17 1545  cefTRIAXone (ROCEPHIN) 1 g in dextrose 5 % 50 mL IVPB - Premix     1 g 100 mL/hr over 30 Minutes Intravenous  Once 12/20/17 1544 12/20/17 1702   12/20/17 1545  azithromycin (ZITHROMAX) 500 mg in dextrose 5 % 250 mL IVPB     500 mg 250 mL/hr over 60 Minutes Intravenous   Once 12/20/17 1544 12/20/17 1756      Medications   Scheduled Meds: . amiodarone  400 mg Oral BID  . atorvastatin  40 mg Oral Daily  . carvedilol  3.125 mg Oral BID  . clopidogrel  75 mg Oral Daily  . furosemide  40 mg Intravenous BID  . heparin  5,000 Units Subcutaneous Q8H  . Influenza vac split quadrivalent PF  0.5 mL Intramuscular Tomorrow-1000  . ipratropium-albuterol  3 mL Nebulization QID  . ivabradine  5 mg Oral BID WC  . losartan  25 mg Oral Daily  . midodrine  10 mg Oral TID WC  . oseltamivir  75 mg Oral BID  . pantoprazole  40 mg Oral Daily  . pneumococcal 23 valent vaccine  0.5 mL Intramuscular Tomorrow-1000  . sodium chloride flush  3 mL Intravenous Q12H  . tiotropium  18 mcg Inhalation Daily   Continuous Infusions: . sodium chloride    . azithromycin Stopped (12/21/17 1920)  . cefTRIAXone (ROCEPHIN)  IV Stopped (12/22/17 1015)   PRN Meds:.sodium chloride, acetaminophen **OR** acetaminophen, albuterol, bisacodyl, cyclobenzaprine, diclofenac sodium, docusate sodium, fluticasone, guaiFENesin, nitroGLYCERIN, ondansetron **OR** ondansetron (ZOFRAN) IV, polyethylene glycol, sodium chloride flush, traMADol   Data Review:   Micro Results Recent Results (from the past 240 hour(s))  Culture, sputum-assessment     Status: None   Collection Time: 12/20/17  2:22 PM  Result Value Ref Range Status   Specimen Description FLUID  Final   Special Requests NONE  Final   Sputum evaluation   Final    Sputum specimen not acceptable for testing.  Please recollect.   CALLED STACEY CLAY ON 12/21/17 AT 2111 BY JAG Performed at [facility], [address] [place], [place] [address]    Report Status 12/22/2017 FINAL  Final  Blood culture (routine x 2)     Status: None (Preliminary result)   Collection Time: 12/20/17  2:39 PM  Result Value Ref Range Status  Specimen Description BLOOD Blood Culture adequate volume  Final   Special Requests BLOOD LEFT FOREARM  Final    Culture   Final    NO GROWTH 2 DAYS Performed at [facility], [address] [place], [place] [address]    Report Status PENDING  Incomplete  Blood culture (routine x 2)     Status: None (Preliminary result)   Collection Time: 12/20/17  2:39 PM  Result Value Ref Range Status   Specimen Description BLOOD Blood Culture adequate volume  Final   Special Requests BLOOD RIGHT FOREARM  Final   Culture   Final    NO GROWTH 2 DAYS Performed at [facility], [address], [place], [place] [address]    Report Status PENDING  Incomplete    Radiology Reports Dg Chest 2 View  Result Date: 12/20/2017 CLINICAL DATA:  Cough.  Body aches.  Fevers. EXAM: CHEST  2 VIEW COMPARISON:  CT 09/28/2017.  Chest x-ray 920 08/07/2017. FINDINGS: Severe cardiomegaly with pulmonary vascular prominence and bilateral interstitial prominence and bibasilar infiltrates. These findings suggest congestive heart failure with bilateral pulmonary edema. Bilateral pneumonia cannot be excluded . IMPRESSION: Severe cardiomegaly. Pulmonary venous congestion with bilateral interstitial prominence and bibasilar alveolar infiltrates. These findings suggest congestive heart failure with pulmonary edema. Bilateral pneumonia cannot be excluded. Electronically Signed   By: Marcello Moores  Register   On: 12/20/2017 15:13     CBC Recent Labs  Lab 12/20/17 1438 12/21/17 0347  WBC 7.9 6.1  HGB 15.3 14.1  HCT 46.1 43.2  PLT 142* 120*  MCV 89.9 91.0  MCH 29.7 29.7  MCHC 33.1 32.6  RDW 15.2* 15.2*  LYMPHSABS 0.4*  --   MONOABS 0.6  --   EOSABS 0.0  --   BASOSABS 0.0  --     Chemistries  Recent Labs  Lab 12/20/17 1438 12/21/17 0347 12/22/17 0339  NA 136 134* 134*  K 3.4* 3.4* 3.9  CL 100* 101 99*  CO2 22 23 25   GLUCOSE 127* 140* 124*  BUN 22* 28* 38*  CREATININE 1.41* 1.50* 1.63*  CALCIUM 9.2 8.8* 8.7*  AST 40  --   --   ALT 20  --   --   ALKPHOS 55  --   --   BILITOT 1.9*  --   --     ------------------------------------------------------------------------------------------------------------------ estimated creatinine clearance is 70 mL/min (A) (by C-G formula based on SCr of 1.63 mg/dL (H)). ------------------------------------------------------------------------------------------------------------------ No results for input(s): HGBA1C in the last 72 hours. ------------------------------------------------------------------------------------------------------------------ No results for input(s): CHOL, HDL, LDLCALC, TRIG, CHOLHDL, LDLDIRECT in the last 72 hours. ------------------------------------------------------------------------------------------------------------------ No results for input(s): TSH, T4TOTAL, T3FREE, THYROIDAB in the last 72 hours.  Invalid input(s): FREET3 ------------------------------------------------------------------------------------------------------------------ No results for input(s): VITAMINB12, FOLATE, FERRITIN, TIBC, IRON, RETICCTPCT in the last 72 hours.  Coagulation profile No results for input(s): INR, PROTIME in the last 168 hours.  No results for input(s): DDIMER in the last 72 hours.  Cardiac Enzymes No results for input(s): CKMB, TROPONINI, MYOGLOBIN in the last 168 hours.  Invalid input(s): CK ------------------------------------------------------------------------------------------------------------------ Invalid input(s): POCBNP    Assessment & Plan  Pt is 51 y.o with history of systolic CHF presenting with shortness of breath  #1 acute respiratory failure due to combination of acute CHF and flu  #2 acute on chronic systolic CHF: IV lasix  Continue Coreg and Lasix Patient's medications have been adjusted by cardiology Increase midodrine dose   #3.  Possible pneumonia: Pro-calcitonin is high suggestive of pneumonia  Continue azithromycin and Rocephin  #4.  Influenza A continue continue Tamiflu  #5 hypokalemia  replace potassium  #6.  Hypotension increased midrodrine  #7 history of non-STEMI.  Continue Plavix and Lipitor.  #8 CKD stage III.  Stable.  #9 tobacco abuse.  Smoking cessation was provided yesterday      Code Status Orders  (From admission, onward)        Start     Ordered   12/20/17 1852  Full code  Continuous     12/20/17 1851    Code Status History    Date Active Date Inactive Code Status Order ID Comments User Context   09/08/2017 06:44 09/10/2017 21:27 Full Code [id number]  Harrie Foreman, MD Inpatient   07/29/2017 10:44 07/31/2017 21:52 Full Code [id number]  Henreitta Leber, MD Inpatient   06/19/2017 00:27 06/20/2017 21:14 Full Code [id number]  Lance Coon, MD ED   05/20/2017 12:26 05/21/2017 17:59 Full Code [id number]  [name], MD Inpatient   06/22/2016 04:30 06/22/2016 21:50 Full Code [id number]  Quintella Baton, MD Inpatient   06/04/2016 18:43 06/06/2016 18:15 Full Code [id number]  [name], MD Inpatient           Consults  cardiology   DVT Prophylaxis  Lovenox    Lab Results  Component Value Date   PLT 120 (L) 12/21/2017     Time Spent in minutes 55min Greater than 50% of time spent in care coordination and counseling patient regarding the condition and plan of care.   [name] M.D on 12/22/2017 at 1:45 PM  Between 7am to 6pm - Pager - [phone]  After 6pm go to [email or web address] - password [email or web address] [facility]   Office  [phone]

## 2017-12-22 NOTE — Progress Notes (Signed)
Progress Note  Patient Name: Stephen [name] Date of Encounter: 12/22/2017  Primary Cardiologist: Fletcher Anon   Subjective   Still with SOB, orthopnea and pleuritic chest pain.   Inpatient Medications    Scheduled Meds: . amiodarone  400 mg Oral BID  . atorvastatin  40 mg Oral Daily  . carvedilol  3.125 mg Oral BID  . clopidogrel  75 mg Oral Daily  . furosemide  40 mg Intravenous BID  . heparin  5,000 Units Subcutaneous Q8H  . Influenza vac split quadrivalent PF  0.5 mL Intramuscular Tomorrow-1000  . ipratropium-albuterol  3 mL Nebulization QID  . ivabradine  5 mg Oral BID WC  . losartan  25 mg Oral Daily  . midodrine  10 mg Oral TID WC  . oseltamivir  75 mg Oral BID  . pantoprazole  40 mg Oral Daily  . pneumococcal 23 valent vaccine  0.5 mL Intramuscular Tomorrow-1000  . sodium chloride flush  3 mL Intravenous Q12H  . tiotropium  18 mcg Inhalation Daily   Continuous Infusions: . sodium chloride    . azithromycin Stopped (12/21/17 1920)  . cefTRIAXone (ROCEPHIN)  IV 1 g (12/22/17 0940)   PRN Meds: sodium chloride, acetaminophen **OR** acetaminophen, albuterol, bisacodyl, cyclobenzaprine, diclofenac sodium, docusate sodium, fluticasone, guaiFENesin, nitroGLYCERIN, ondansetron **OR** ondansetron (ZOFRAN) IV, polyethylene glycol, sodium chloride flush, traMADol   Vital Signs    Vitals:   12/21/17 1707 12/21/17 1955 12/22/17 0558 12/22/17 0719  BP: 116/75 102/78 96/66   Pulse:  98 (!) 126   Resp:  20    Temp:  98.8 F (37.1 C) 98.1 F (36.7 C)   TempSrc:  Oral Oral   SpO2:  97% 98% 99%  Weight:      Height:        Intake/Output Summary (Last 24 hours) at 12/22/2017 1224 Last data filed at 12/22/2017 1115 Gross per 24 hour  Intake 483 ml  Output 1825 ml  Net -1342 ml   Filed Weights   12/20/17 1322  Weight: 250 lb (113.4 kg)    Telemetry    NSR with PVCs - Personally Reviewed  ECG     Physical Exam   GEN: No acute distress.   Neck: mild  JVD Cardiac: RRR with premature beats, no murmurs, rubs, or gallops.  Respiratory: bibasilar crackles GI: Soft, nontender, non-distended  MS: No edema; No deformity. Neuro:  Nonfocal  Psych: Normal affect   Labs    Chemistry Recent Labs  Lab 12/20/17 1438 12/21/17 0347 12/22/17 0339  NA 136 134* 134*  K 3.4* 3.4* 3.9  CL 100* 101 99*  CO2 22 23 25   GLUCOSE 127* 140* 124*  BUN 22* 28* 38*  CREATININE 1.41* 1.50* 1.63*  CALCIUM 9.2 8.8* 8.7*  PROT 7.1  --   --   ALBUMIN 4.1  --   --   AST 40  --   --   ALT 20  --   --   ALKPHOS 55  --   --   BILITOT 1.9*  --   --   GFRNONAA 56* 52* 47*  GFRAA >60 >60 54*  ANIONGAP 14 10 10      Hematology Recent Labs  Lab 12/20/17 1438 12/21/17 0347  WBC 7.9 6.1  RBC 5.13 4.74  HGB 15.3 14.1  HCT 46.1 43.2  MCV 89.9 91.0  MCH 29.7 29.7  MCHC 33.1 32.6  RDW 15.2* 15.2*  PLT 142* 120*    Cardiac EnzymesNo results for input(s): TROPONINI  in the last 168 hours. No results for input(s): TROPIPOC in the last 168 hours.   BNPNo results for input(s): BNP, PROBNP in the last 168 hours.   DDimer No results for input(s): DDIMER in the last 168 hours.   Radiology    Dg Chest 2 View  Result Date: 12/20/2017 CLINICAL DATA:  Cough.  Body aches.  Fevers. EXAM: CHEST  2 VIEW COMPARISON:  CT 09/28/2017.  Chest x-ray 920 08/07/2017. FINDINGS: Severe cardiomegaly with pulmonary vascular prominence and bilateral interstitial prominence and bibasilar infiltrates. These findings suggest congestive heart failure with bilateral pulmonary edema. Bilateral pneumonia cannot be excluded . IMPRESSION: Severe cardiomegaly. Pulmonary venous congestion with bilateral interstitial prominence and bibasilar alveolar infiltrates. These findings suggest congestive heart failure with pulmonary edema. Bilateral pneumonia cannot be excluded. Electronically Signed   By: Marcello Moores  Register   On: 12/20/2017 15:13    Cardiac Studies    Patient Profile     51 y.o.  male chronic systolic heart failure, Previous TIA and CKD who presented with the flu and decompensated heart failure  Assessment & Plan     1. Acute on chronic systolic heart failure with severely reduced LV systolic function: still volume overloaded. Continue furosemide intravenously 40 mg twice daily. Continue small dose Coreg and Losartan.   we should have low threshold to transfer to the ICU and initiating treatment with an inotrope and pressors if condition worsens 2. Frequent PVCs: Amiodarone was started yesturday as was recommended by [name] recently. 3. Influenza A: Continue treatment with Tamiflu. 4. Chronic kidney disease: slight worsening with diuresis. Monitor.  5. Previous TIA: Continue treatment with Plavix.    For questions or updates, please contact [place] Please consult [email or web address] for contact info under Cardiology/STEMI.      Signed, Kathlyn Sacramento, MD  12/22/2017, 12:24 PM

## 2017-12-22 NOTE — Progress Notes (Signed)
Patient c/o increasing SOB and chest pain. Administered flexeril and increased O2 to 3 liters. Patient stated he was feeling better.

## 2017-12-23 ENCOUNTER — Inpatient Hospital Stay: Payer: Commercial Managed Care - PPO

## 2017-12-23 ENCOUNTER — Encounter: Payer: Self-pay | Admitting: Adult Health

## 2017-12-23 ENCOUNTER — Inpatient Hospital Stay: Admit: 2017-12-23 | Payer: Commercial Managed Care - PPO

## 2017-12-23 ENCOUNTER — Inpatient Hospital Stay (HOSPITAL_COMMUNITY)
Admission: AD | Admit: 2017-12-23 | Discharge: 2017-12-31 | DRG: 226 | Disposition: A | Discharge status: Home / Self Care | Payer: Commercial Managed Care - PPO | Source: Other Acute Inpatient Hospital | Attending: Cardiovascular Disease | Admitting: Cardiovascular Disease

## 2017-12-23 ENCOUNTER — Inpatient Hospital Stay (HOSPITAL_COMMUNITY): Payer: Commercial Managed Care - PPO

## 2017-12-23 ENCOUNTER — Encounter (HOSPITAL_COMMUNITY): Payer: Self-pay | Admitting: Cardiology

## 2017-12-23 DIAGNOSIS — R570 Cardiogenic shock: Secondary | ICD-10-CM | POA: Diagnosis present

## 2017-12-23 DIAGNOSIS — G4730 Sleep apnea, unspecified: Secondary | ICD-10-CM | POA: Diagnosis present

## 2017-12-23 DIAGNOSIS — I4901 Ventricular fibrillation: Secondary | ICD-10-CM | POA: Diagnosis present

## 2017-12-23 DIAGNOSIS — N183 Chronic kidney disease, stage 3 (moderate): Secondary | ICD-10-CM | POA: Diagnosis present

## 2017-12-23 DIAGNOSIS — J1000 Influenza due to other identified influenza virus with unspecified type of pneumonia: Secondary | ICD-10-CM | POA: Diagnosis not present

## 2017-12-23 DIAGNOSIS — J111 Influenza due to unidentified influenza virus with other respiratory manifestations: Secondary | ICD-10-CM | POA: Diagnosis present

## 2017-12-23 DIAGNOSIS — I428 Other cardiomyopathies: Secondary | ICD-10-CM | POA: Diagnosis not present

## 2017-12-23 DIAGNOSIS — Z8249 Family history of ischemic heart disease and other diseases of the circulatory system: Secondary | ICD-10-CM | POA: Diagnosis not present

## 2017-12-23 DIAGNOSIS — Z888 Allergy status to other drugs, medicaments and biological substances status: Secondary | ICD-10-CM

## 2017-12-23 DIAGNOSIS — I5023 Acute on chronic systolic (congestive) heart failure: Secondary | ICD-10-CM | POA: Diagnosis present

## 2017-12-23 DIAGNOSIS — I130 Hypertensive heart and chronic kidney disease with heart failure and stage 1 through stage 4 chronic kidney disease, or unspecified chronic kidney disease: Secondary | ICD-10-CM | POA: Diagnosis present

## 2017-12-23 DIAGNOSIS — Z8673 Personal history of transient ischemic attack (TIA), and cerebral infarction without residual deficits: Secondary | ICD-10-CM

## 2017-12-23 DIAGNOSIS — N179 Acute kidney failure, unspecified: Secondary | ICD-10-CM

## 2017-12-23 DIAGNOSIS — Z452 Encounter for adjustment and management of vascular access device: Secondary | ICD-10-CM

## 2017-12-23 DIAGNOSIS — Z7951 Long term (current) use of inhaled steroids: Secondary | ICD-10-CM | POA: Diagnosis not present

## 2017-12-23 DIAGNOSIS — I472 Ventricular tachycardia: Secondary | ICD-10-CM | POA: Diagnosis not present

## 2017-12-23 DIAGNOSIS — I2510 Atherosclerotic heart disease of native coronary artery without angina pectoris: Secondary | ICD-10-CM | POA: Diagnosis present

## 2017-12-23 DIAGNOSIS — I429 Cardiomyopathy, unspecified: Secondary | ICD-10-CM | POA: Diagnosis not present

## 2017-12-23 DIAGNOSIS — J101 Influenza due to other identified influenza virus with other respiratory manifestations: Secondary | ICD-10-CM | POA: Diagnosis not present

## 2017-12-23 DIAGNOSIS — Z79899 Other long term (current) drug therapy: Secondary | ICD-10-CM

## 2017-12-23 DIAGNOSIS — Z7902 Long term (current) use of antithrombotics/antiplatelets: Secondary | ICD-10-CM

## 2017-12-23 DIAGNOSIS — R05 Cough: Secondary | ICD-10-CM | POA: Diagnosis not present

## 2017-12-23 DIAGNOSIS — E871 Hypo-osmolality and hyponatremia: Secondary | ICD-10-CM | POA: Diagnosis not present

## 2017-12-23 DIAGNOSIS — I493 Ventricular premature depolarization: Secondary | ICD-10-CM | POA: Diagnosis present

## 2017-12-23 DIAGNOSIS — E876 Hypokalemia: Secondary | ICD-10-CM | POA: Diagnosis not present

## 2017-12-23 DIAGNOSIS — J9601 Acute respiratory failure with hypoxia: Secondary | ICD-10-CM | POA: Diagnosis present

## 2017-12-23 DIAGNOSIS — I469 Cardiac arrest, cause unspecified: Secondary | ICD-10-CM | POA: Diagnosis not present

## 2017-12-23 DIAGNOSIS — Z959 Presence of cardiac and vascular implant and graft, unspecified: Secondary | ICD-10-CM

## 2017-12-23 HISTORY — DX: Cardiac arrest, cause unspecified: I46.9

## 2017-12-23 HISTORY — DX: Other cardiomyopathies: I42.8

## 2017-12-23 LAB — COMPREHENSIVE METABOLIC PANEL
ALT: 56 U/L (ref 17–63)
AST: 84 U/L — AB (ref 15–41)
Albumin: 3.3 g/dL — ABNORMAL LOW (ref 3.5–5.0)
Alkaline Phosphatase: 50 U/L (ref 38–126)
Anion gap: 12 (ref 5–15)
BUN: 44 mg/dL — ABNORMAL HIGH (ref 6–20)
CHLORIDE: 92 mmol/L — AB (ref 101–111)
CO2: 26 mmol/L (ref 22–32)
CREATININE: 1.86 mg/dL — AB (ref 0.61–1.24)
Calcium: 8.5 mg/dL — ABNORMAL LOW (ref 8.9–10.3)
GFR calc non Af Amer: 40 mL/min — ABNORMAL LOW (ref >60)
GFR, EST AFRICAN AMERICAN: 46 mL/min — AB (ref >60)
Glucose, Bld: 125 mg/dL — ABNORMAL HIGH (ref 65–99)
Potassium: 3.4 mmol/L — ABNORMAL LOW (ref 3.5–5.1)
SODIUM: 130 mmol/L — AB (ref 135–145)
Total Bilirubin: 0.8 mg/dL (ref 0.3–1.2)
Total Protein: 6.5 g/dL (ref 6.5–8.1)

## 2017-12-23 LAB — POCT I-STAT 3, ART BLOOD GAS (G3+)
Acid-base deficit: 4 mmol/L — ABNORMAL HIGH (ref 0.0–2.0)
BICARBONATE: 22.2 mmol/L (ref 20.0–28.0)
O2 Saturation: 96 %
PCO2 ART: 42.7 mmHg (ref 32.0–48.0)
TCO2: 23 mmol/L (ref 22–32)
pH, Arterial: 7.326 — ABNORMAL LOW (ref 7.350–7.450)
pO2, Arterial: 88 mmHg (ref 83.0–108.0)

## 2017-12-23 LAB — CBC
HCT: 42.8 % (ref 39.0–52.0)
HCT: 45.1 % (ref 40.0–52.0)
Hemoglobin: 14.3 g/dL (ref 13.0–17.0)
Hemoglobin: 14.6 g/dL (ref 13.0–18.0)
MCH: 29.4 pg (ref 26.0–34.0)
MCH: 29.6 pg (ref 26.0–34.0)
MCHC: 32.3 g/dL (ref 32.0–36.0)
MCHC: 33.4 g/dL (ref 30.0–36.0)
MCV: 88.6 fL (ref 78.0–100.0)
MCV: 90.9 fL (ref 80.0–100.0)
PLATELETS: 118 10*3/uL — AB (ref 150–440)
Platelets: 131 10*3/uL — ABNORMAL LOW (ref 150–400)
RBC: 4.83 MIL/uL (ref 4.22–5.81)
RBC: 4.97 MIL/uL (ref 4.40–5.90)
RDW: 15 % (ref 11.5–15.5)
RDW: 15.5 % — ABNORMAL HIGH (ref 11.5–14.5)
WBC: 5.7 10*3/uL (ref 3.8–10.6)
WBC: 6.2 10*3/uL (ref 4.0–10.5)

## 2017-12-23 LAB — URINE DRUG SCREEN, QUALITATIVE (ARMC ONLY)
AMPHETAMINES, UR SCREEN: NOT DETECTED
Barbiturates, Ur Screen: NOT DETECTED
Benzodiazepine, Ur Scrn: NOT DETECTED
COCAINE METABOLITE, UR ~~LOC~~: NOT DETECTED
Cannabinoid 50 Ng, Ur ~~LOC~~: NOT DETECTED
MDMA (ECSTASY) UR SCREEN: NOT DETECTED
METHADONE SCREEN, URINE: NOT DETECTED
OPIATE, UR SCREEN: NOT DETECTED
Phencyclidine (PCP) Ur S: NOT DETECTED
Tricyclic, Ur Screen: NOT DETECTED

## 2017-12-23 LAB — BASIC METABOLIC PANEL
ANION GAP: 11 (ref 5–15)
ANION GAP: 12 (ref 5–15)
BUN: 47 mg/dL — AB (ref 6–20)
BUN: 51 mg/dL — ABNORMAL HIGH (ref 6–20)
CALCIUM: 8.5 mg/dL — AB (ref 8.9–10.3)
CHLORIDE: 94 mmol/L — AB (ref 101–111)
CO2: 22 mmol/L (ref 22–32)
CO2: 25 mmol/L (ref 22–32)
Calcium: 8.6 mg/dL — ABNORMAL LOW (ref 8.9–10.3)
Chloride: 92 mmol/L — ABNORMAL LOW (ref 101–111)
Creatinine, Ser: 2.22 mg/dL — ABNORMAL HIGH (ref 0.61–1.24)
Creatinine, Ser: 2.53 mg/dL — ABNORMAL HIGH (ref 0.61–1.24)
GFR calc Af Amer: 37 mL/min — ABNORMAL LOW (ref >60)
GFR, EST AFRICAN AMERICAN: 32 mL/min — AB (ref >60)
GFR, EST NON AFRICAN AMERICAN: 32 mL/min — AB (ref >60)
GFR, EST NON AFRICAN AMERICAN: 28 mL/min — AB (ref >60)
GLUCOSE: 145 mg/dL — AB (ref 65–99)
GLUCOSE: 143 mg/dL — AB (ref 65–99)
POTASSIUM: 4.8 mmol/L (ref 3.5–5.1)
POTASSIUM: 4 mmol/L (ref 3.5–5.1)
Sodium: 127 mmol/L — ABNORMAL LOW (ref 135–145)
Sodium: 129 mmol/L — ABNORMAL LOW (ref 135–145)

## 2017-12-23 LAB — GLUCOSE, CAPILLARY
GLUCOSE-CAPILLARY: 133 mg/dL — AB (ref 65–99)
GLUCOSE-CAPILLARY: 174 mg/dL — AB (ref 65–99)

## 2017-12-23 LAB — LACTIC ACID, PLASMA
LACTIC ACID, VENOUS: 1.9 mmol/L (ref 0.5–1.9)
LACTIC ACID, VENOUS: 2.6 mmol/L — AB (ref 0.5–1.9)

## 2017-12-23 LAB — COOXEMETRY PANEL
CARBOXYHEMOGLOBIN: 1 % (ref 0.5–1.5)
Carboxyhemoglobin: 0.4 % — ABNORMAL LOW (ref 0.5–1.5)
METHEMOGLOBIN: 0.7 % (ref 0.0–1.5)
Methemoglobin: 0.9 % (ref 0.0–1.5)
O2 SAT: 63.7 %
O2 Saturation: 45.6 %
TOTAL HEMOGLOBIN: 14.7 g/dL (ref 12.0–16.0)
Total hemoglobin: 15.4 g/dL (ref 12.0–16.0)

## 2017-12-23 LAB — PHOSPHORUS
Phosphorus: 4.2 mg/dL (ref 2.5–4.6)
Phosphorus: 4.7 mg/dL — ABNORMAL HIGH (ref 2.5–4.6)

## 2017-12-23 LAB — TROPONIN I
Troponin I: 0.09 ng/mL (ref <0.03)
Troponin I: 0.15 ng/mL (ref <0.03)

## 2017-12-23 LAB — MAGNESIUM
Magnesium: 2 mg/dL (ref 1.7–2.4)
Magnesium: 2.3 mg/dL (ref 1.7–2.4)

## 2017-12-23 LAB — BRAIN NATRIURETIC PEPTIDE: B NATRIURETIC PEPTIDE 5: 1017 pg/mL — AB (ref 0.0–100.0)

## 2017-12-23 LAB — MRSA PCR SCREENING: MRSA BY PCR: NEGATIVE

## 2017-12-23 MED ORDER — FUROSEMIDE 10 MG/ML IJ SOLN: 40.0000 mg | Freq: Once | INTRAMUSCULAR | Status: AC

## 2017-12-23 MED ORDER — MAGNESIUM SULFATE IN D5W 1-5 GM/100ML-% IV SOLN
1.0000 g | Freq: Once | INTRAVENOUS | Status: AC
  Administered 2017-12-23: 1 g via INTRAVENOUS
  Filled 2017-12-23: qty 100

## 2017-12-23 MED ORDER — ASPIRIN EC 81 MG PO TBEC
81.0000 mg | DELAYED_RELEASE_TABLET | Freq: Every day | ORAL | Status: DC
  Administered 2017-12-24 – 2017-12-31 (×8): 81 mg via ORAL
  Filled 2017-12-23 (×8): qty 1

## 2017-12-23 MED ORDER — SODIUM CHLORIDE 0.9 % IV SOLN: INTRAVENOUS | Status: DC

## 2017-12-23 MED ORDER — FUROSEMIDE 10 MG/ML IJ SOLN
80.0000 mg | Freq: Two times a day (BID) | INTRAMUSCULAR | Status: DC
  Administered 2017-12-23 – 2017-12-24 (×3): 80 mg via INTRAVENOUS
  Filled 2017-12-23 (×3): qty 8

## 2017-12-23 MED ORDER — FUROSEMIDE 10 MG/ML IJ SOLN
INTRAMUSCULAR | Status: AC
  Filled 2017-12-23: qty 8

## 2017-12-23 MED ORDER — MIDAZOLAM HCL 2 MG/2ML IJ SOLN
INTRAMUSCULAR | Status: AC
  Filled 2017-12-23: qty 2

## 2017-12-23 MED ORDER — FUROSEMIDE 10 MG/ML IJ SOLN
80.0000 mg | Freq: Once | INTRAMUSCULAR | Status: AC
  Administered 2017-12-23: 80 mg via INTRAVENOUS

## 2017-12-23 MED ORDER — SODIUM CHLORIDE 0.9 % IV SOLN: 0.0000 ug/min | INTRAVENOUS | Status: DC

## 2017-12-23 MED ORDER — AMIODARONE HCL IN DEXTROSE 360-4.14 MG/200ML-% IV SOLN
60.0000 mg/h | INTRAVENOUS | Status: AC
  Administered 2017-12-23 (×2): 60 mg/h via INTRAVENOUS
  Filled 2017-12-23 (×2): qty 200

## 2017-12-23 MED ORDER — PIPERACILLIN-TAZOBACTAM 3.375 G IVPB
3.3750 g | Freq: Three times a day (TID) | INTRAVENOUS | Status: DC
  Administered 2017-12-23 – 2017-12-27 (×12): 3.375 g via INTRAVENOUS
  Filled 2017-12-23 (×13): qty 50

## 2017-12-23 MED ORDER — AMIODARONE IV BOLUS ONLY 150 MG/100ML
150.0000 mg | Freq: Once | INTRAVENOUS | Status: AC
  Administered 2017-12-23: 150 mg via INTRAVENOUS

## 2017-12-23 MED ORDER — HYDROCODONE-ACETAMINOPHEN 5-325 MG PO TABS: 1.0000 | ORAL_TABLET | ORAL | Status: DC | PRN

## 2017-12-23 MED ORDER — ONDANSETRON HCL 4 MG/2ML IJ SOLN: 4.0000 mg | Freq: Four times a day (QID) | INTRAMUSCULAR | Status: DC | PRN

## 2017-12-23 MED ORDER — SODIUM CHLORIDE 0.9% FLUSH
3.0000 mL | Freq: Two times a day (BID) | INTRAVENOUS | Status: DC
  Administered 2017-12-23 – 2017-12-26 (×7): 3 mL via INTRAVENOUS

## 2017-12-23 MED ORDER — DOXYCYCLINE HYCLATE 100 MG PO TABS
100.0000 mg | ORAL_TABLET | Freq: Two times a day (BID) | ORAL | Status: DC
  Administered 2017-12-23: 100 mg via ORAL
  Filled 2017-12-23: qty 1

## 2017-12-23 MED ORDER — AMIODARONE HCL IN DEXTROSE 360-4.14 MG/200ML-% IV SOLN
60.0000 mg/h | INTRAVENOUS | Status: DC
  Administered 2017-12-23: 30 mg/h via INTRAVENOUS
  Administered 2017-12-23: 60 mg/h via INTRAVENOUS
  Filled 2017-12-23 (×2): qty 200

## 2017-12-23 MED ORDER — SODIUM CHLORIDE 0.9 % IV SOLN: 250.0000 mL | INTRAVENOUS | Status: DC | PRN

## 2017-12-23 MED ORDER — METOLAZONE 5 MG PO TABS
5.0000 mg | ORAL_TABLET | Freq: Once | ORAL | Status: AC
  Administered 2017-12-23: 5 mg via ORAL
  Filled 2017-12-23: qty 1

## 2017-12-23 MED ORDER — OSELTAMIVIR PHOSPHATE 75 MG PO CAPS
75.0000 mg | ORAL_CAPSULE | Freq: Two times a day (BID) | ORAL | Status: DC
  Administered 2017-12-23 – 2017-12-24 (×2): 75 mg via ORAL
  Filled 2017-12-23 (×2): qty 1

## 2017-12-23 MED ORDER — FENTANYL CITRATE (PF) 100 MCG/2ML IJ SOLN
INTRAMUSCULAR | Status: AC
  Filled 2017-12-23: qty 2

## 2017-12-23 MED ORDER — PHENYLEPHRINE HCL 10 MG/ML IJ SOLN
0.0000 ug/min | INTRAMUSCULAR | Status: DC
  Administered 2017-12-23 (×2): 20 ug/min via INTRAVENOUS
  Filled 2017-12-23 (×2): qty 10
  Filled 2017-12-23: qty 1

## 2017-12-23 MED ORDER — POTASSIUM CHLORIDE 10 MEQ/100ML IV SOLN
10.0000 meq | INTRAVENOUS | Status: AC
  Administered 2017-12-23 (×2): 10 meq via INTRAVENOUS
  Filled 2017-12-23 (×2): qty 100

## 2017-12-23 MED ORDER — AMIODARONE HCL IN DEXTROSE 360-4.14 MG/200ML-% IV SOLN
60.0000 mg/h | INTRAVENOUS | Status: AC
  Administered 2017-12-23: 60 mg/h via INTRAVENOUS
  Filled 2017-12-23: qty 200

## 2017-12-23 MED ORDER — IPRATROPIUM-ALBUTEROL 0.5-2.5 (3) MG/3ML IN SOLN: 3.0000 mL | RESPIRATORY_TRACT | Status: DC | PRN

## 2017-12-23 MED ORDER — PHENYLEPHRINE HCL 10 MG/ML IJ SOLN
0.0000 ug/min | INTRAVENOUS | Status: DC
  Filled 2017-12-23: qty 1

## 2017-12-23 MED ORDER — ASPIRIN 81 MG PO CHEW: 81.0000 mg | CHEWABLE_TABLET | ORAL | Status: AC

## 2017-12-23 MED ORDER — MILRINONE LACTATE IN DEXTROSE 20-5 MG/100ML-% IV SOLN
0.1250 ug/kg/min | INTRAVENOUS | Status: DC
  Administered 2017-12-23 – 2017-12-25 (×4): 0.25 ug/kg/min via INTRAVENOUS
  Administered 2017-12-25: 0.125 ug/kg/min via INTRAVENOUS
  Filled 2017-12-23 (×5): qty 100

## 2017-12-23 MED ORDER — LEVOFLOXACIN IN D5W 750 MG/150ML IV SOLN
750.0000 mg | INTRAVENOUS | Status: DC
  Filled 2017-12-23: qty 150

## 2017-12-23 MED ORDER — AMIODARONE HCL IN DEXTROSE 360-4.14 MG/200ML-% IV SOLN
30.0000 mg/h | INTRAVENOUS | Status: DC
  Administered 2017-12-23 – 2017-12-27 (×8): 30 mg/h via INTRAVENOUS
  Filled 2017-12-23 (×8): qty 200

## 2017-12-23 MED ORDER — CHLORHEXIDINE GLUCONATE CLOTH 2 % EX PADS
6.0000 | MEDICATED_PAD | Freq: Every day | CUTANEOUS | Status: DC
  Administered 2017-12-24 – 2017-12-26 (×3): 6 via TOPICAL

## 2017-12-23 MED ORDER — FENTANYL CITRATE (PF) 100 MCG/2ML IJ SOLN
50.0000 ug | Freq: Once | INTRAMUSCULAR | Status: AC
  Administered 2017-12-23: 25 ug via INTRAVENOUS

## 2017-12-23 MED ORDER — SODIUM CHLORIDE 0.9% FLUSH: 3.0000 mL | INTRAVENOUS | Status: DC | PRN

## 2017-12-23 MED ORDER — NITROGLYCERIN 0.4 MG SL SUBL: 0.4000 mg | SUBLINGUAL_TABLET | SUBLINGUAL | Status: DC | PRN

## 2017-12-23 MED ORDER — NOREPINEPHRINE BITARTRATE 1 MG/ML IV SOLN
0.0000 ug/min | INTRAVENOUS | Status: DC
  Administered 2017-12-23 (×2): 8 ug/min via INTRAVENOUS
  Filled 2017-12-23 (×2): qty 4

## 2017-12-23 MED ORDER — SODIUM CHLORIDE 0.9% FLUSH
10.0000 mL | Freq: Two times a day (BID) | INTRAVENOUS | Status: DC
  Administered 2017-12-23 – 2017-12-30 (×9): 10 mL

## 2017-12-23 MED ORDER — SODIUM CHLORIDE 0.9% FLUSH
10.0000 mL | INTRAVENOUS | Status: DC | PRN
  Administered 2017-12-30 (×2): 10 mL
  Filled 2017-12-23 (×2): qty 40

## 2017-12-23 MED ORDER — METOPROLOL TARTRATE 5 MG/5ML IV SOLN: 5.0000 mg | Freq: Once | INTRAVENOUS | Status: DC

## 2017-12-23 MED ORDER — HEPARIN SODIUM (PORCINE) 5000 UNIT/ML IJ SOLN
5000.0000 [IU] | Freq: Three times a day (TID) | INTRAMUSCULAR | Status: DC
  Administered 2017-12-23 – 2017-12-29 (×18): 5000 [IU] via SUBCUTANEOUS
  Filled 2017-12-23 (×18): qty 1

## 2017-12-23 MED ORDER — KETOROLAC TROMETHAMINE 15 MG/ML IJ SOLN
15.0000 mg | Freq: Once | INTRAMUSCULAR | Status: AC
  Administered 2017-12-23: 15 mg via INTRAVENOUS
  Filled 2017-12-23: qty 1

## 2017-12-23 MED ORDER — OSELTAMIVIR PHOSPHATE 30 MG PO CAPS
30.0000 mg | ORAL_CAPSULE | Freq: Two times a day (BID) | ORAL | Status: DC
  Administered 2017-12-23: 30 mg via ORAL
  Filled 2017-12-23: qty 1

## 2017-12-23 MED ORDER — MORPHINE SULFATE (PF) 2 MG/ML IV SOLN
2.0000 mg | Freq: Once | INTRAVENOUS | Status: AC
  Administered 2017-12-23: 2 mg via INTRAVENOUS

## 2017-12-23 MED ORDER — MORPHINE SULFATE (PF) 2 MG/ML IV SOLN
2.0000 mg | INTRAVENOUS | Status: DC | PRN
  Administered 2017-12-23 (×2): 2 mg via INTRAVENOUS
  Filled 2017-12-23 (×3): qty 1

## 2017-12-23 MED ORDER — LIDOCAINE 5 % EX PTCH
2.0000 | MEDICATED_PATCH | CUTANEOUS | Status: DC
  Administered 2017-12-23: 2 via TRANSDERMAL
  Filled 2017-12-23: qty 2

## 2017-12-23 MED ORDER — POTASSIUM CHLORIDE 20 MEQ PO PACK
60.0000 meq | PACK | Freq: Once | ORAL | Status: AC
  Administered 2017-12-23: 60 meq via ORAL
  Filled 2017-12-23: qty 3

## 2017-12-23 MED ORDER — ORAL CARE MOUTH RINSE
15.0000 mL | Freq: Two times a day (BID) | OROMUCOSAL | Status: DC
  Administered 2017-12-24 – 2017-12-31 (×11): 15 mL via OROMUCOSAL

## 2017-12-23 MED ORDER — OSELTAMIVIR PHOSPHATE 75 MG PO CAPS
75.0000 mg | ORAL_CAPSULE | Freq: Two times a day (BID) | ORAL | Status: DC
  Filled 2017-12-23: qty 1

## 2017-12-23 MED ORDER — AMIODARONE HCL IN DEXTROSE 360-4.14 MG/200ML-% IV SOLN: 60.0000 mg/h | INTRAVENOUS | Status: DC

## 2017-12-23 MED ORDER — ACETAMINOPHEN 325 MG PO TABS
650.0000 mg | ORAL_TABLET | ORAL | Status: DC | PRN
  Administered 2017-12-24 – 2017-12-29 (×3): 650 mg via ORAL
  Filled 2017-12-23 (×3): qty 2

## 2017-12-23 MED ORDER — AMIODARONE LOAD VIA INFUSION
150.0000 mg | Freq: Once | INTRAVENOUS | Status: AC
  Administered 2017-12-23: 150 mg via INTRAVENOUS
  Filled 2017-12-23: qty 83.34

## 2017-12-23 MED ORDER — MIDAZOLAM HCL 2 MG/2ML IJ SOLN
2.0000 mg | Freq: Once | INTRAMUSCULAR | Status: AC
  Administered 2017-12-23: 1 mg via INTRAVENOUS

## 2017-12-23 MED ORDER — NOREPINEPHRINE BITARTRATE 1 MG/ML IV SOLN: 0.0000 ug/min | INTRAVENOUS | Status: DC

## 2017-12-23 MED FILL — Medication: Qty: 1 | Status: AC

## 2017-12-23 NOTE — Discharge Summary (Signed)
[facility] - [place] at [facility], [place] y.o., DOB 1966-01-25, MRN [id number]. Admission date: 12/20/2017 Discharge Date 12/23/2017 Primary MD [name], MD Admitting Physician [name], MD  Admission Diagnosis  Cough [R05] Influenza [J11.1] Community acquired pneumonia, unspecified laterality [J18.9]  Discharge Diagnosis   Active Problems: Acute respiratory failure Ventricular fibrillation Acute on chronic systolic CHF Influenza a Possible pneumonia Acute renal failure on chronic kidney disease stage III Hypotension Hypokalemia History of non-ST MI Tobacco abuse     Hospital Course  The patient is 51 year old male with known history of hypertension, chronic systolic CHF, COPD be discharged and not STEMI presented with shortness of breath and fever.  Patient in the emergency room was noted to have influenza A and chest x-ray showed bilateral infiltrates.  These findings suggested a possible pneumonia or CHF.  Patient was started on IV antibiotics.  His physical exam showed significant fluid overload.  Patient was treated with IV Lasix however his blood pressure was low and we could not diurese him very well.  He was seen in consultation by cardiology.  Overnight patient developed cardiac arrest.  Patient was having PVCs and then he went into V. fib on telemetry.  Patient was found unresponsive and pulseless.  He had about 5 chest compressions and got him on Zoll.  Patient received 1 defibrillation and went back into normal sinus rhythm and awake.  He was transferred to the ICU.  The patient's renal function now has worsened.  Per cardiology he may need a defibrillator as well as may need inotropic agents to assist with his acute CHF.  For his flu he has been treated with Tamiflu. He is afebrile now.  His antibiotics have been discontinued is unlikely that he had pneumonia his fevers were from the flu.   Patient has been arranged to be transferred  to [place]  cardiology, pulmonary   Significant Tests:  See full reports for all details     Dg Chest 2 View  Result Date: 12/20/2017 CLINICAL DATA:  Cough.  Body aches.  Fevers. EXAM: CHEST  2 VIEW COMPARISON:  CT 09/28/2017.  Chest x-ray 920 08/07/2017. FINDINGS: Severe cardiomegaly with pulmonary vascular prominence and bilateral interstitial prominence and bibasilar infiltrates. These findings suggest congestive heart failure with bilateral pulmonary edema. Bilateral pneumonia cannot be excluded . IMPRESSION: Severe cardiomegaly. Pulmonary venous congestion with bilateral interstitial prominence and bibasilar alveolar infiltrates. These findings suggest congestive heart failure with pulmonary edema. Bilateral pneumonia cannot be excluded. Electronically Signed   By: Marcello Moores  Register   On: 12/20/2017 15:13   Dg Chest Port 1 View  Result Date: 12/23/2017 CLINICAL DATA:  51 year old male status post cardiac arrest. EXAM: PORTABLE CHEST 1 VIEW COMPARISON:  Chest radiograph dated 12/20/2017 FINDINGS: There is moderate cardiomegaly with findings of vascular congestion and pulmonary edema as seen previously. Overall no significant interval change compared to the earlier radiograph. For no pneumothorax. No acute osseous pathology. IMPRESSION: Cardiomegaly with findings of CHF and pulmonary edema. Overall no significant interval change. Electronically Signed   By: Anner Crete M.D.   On: 12/23/2017 01:11       Today   Subjective:   Stephen [name] currently feeling very tired denies any chest pain Objective:   Blood pressure 93/78, pulse 64, temperature (!) 97.4 F (36.3 C), temperature source Oral, resp. rate (!) 28, height 6\' 1"  (1.854 m), weight 250 lb (113.4 kg), SpO2 95 %.  Marland Kitchen  Intake/Output Summary (Last 24 hours) at 12/23/2017 0907 Last data filed at 12/23/2017 0730 Gross per 24 hour  Intake 782.13 ml  Output 1750 ml  Net -967.87 ml     Exam VITAL SIGNS: Blood pressure 93/78, pulse 64, temperature (!) 97.4 F (36.3 C), temperature source Oral, resp. rate (!) 28, height 6\' 1"  (1.854 m), weight 250 lb (113.4 kg), SpO2 95 %.  GENERAL:  51 y.o.-year-old patient lying in the bed with no acute distress.  EYES: Pupils equal, round, reactive to light and accommodation. No scleral icterus. Extraocular muscles intact.  HEENT: Head atraumatic, normocephalic. Oropharynx and nasopharynx clear.  NECK:  Supple, no jugular venous distention. No thyroid enlargement, no tenderness.  LUNGS: Bilateral crackles at the base without any accessory muscle usage CARDIOVASCULAR: S1, S2 normal. No murmurs, rubs, or gallops.  ABDOMEN: Soft, nontender, nondistended. Bowel sounds present. No organomegaly or mass.  EXTREMITIES: 2+ pedal edema, cyanosis, or clubbing.  NEUROLOGIC: Cranial nerves II through XII are intact. Muscle strength 5/5 in all extremities. Sensation intact. Gait not checked.  PSYCHIATRIC: The patient is alert and oriented x 3.  SKIN: No obvious rash, lesion, or ulcer.   Data Review     CBC w Diff:  Lab Results  Component Value Date   WBC 5.7 12/23/2017   HGB 14.6 12/23/2017   HCT 45.1 12/23/2017   PLT 118 (L) 12/23/2017   LYMPHOPCT 5 12/20/2017   MONOPCT 7 12/20/2017   EOSPCT 0 12/20/2017   BASOPCT 1 12/20/2017   CMP:  Lab Results  Component Value Date   NA 127 (L) 12/23/2017   K 4.8 12/23/2017   CL 94 (L) 12/23/2017   CO2 22 12/23/2017   BUN 47 (H) 12/23/2017   CREATININE 2.22 (H) 12/23/2017   PROT 6.5 12/23/2017   ALBUMIN 3.3 (L) 12/23/2017   BILITOT 0.8 12/23/2017   ALKPHOS 50 12/23/2017   AST 84 (H) 12/23/2017   ALT 56 12/23/2017  .  Micro Results Recent Results (from the past 240 hour(s))  Culture, sputum-assessment     Status: None   Collection Time: 12/20/17  2:22 PM  Result Value Ref Range Status   Specimen Description FLUID  Final   Special Requests NONE  Final   Sputum evaluation   Final     Sputum specimen not acceptable for testing.  Please recollect.   CALLED STACEY CLAY ON 12/21/17 AT 2111 BY JAG Performed at [facility], [address] [place], [place] [address]    Report Status 12/22/2017 FINAL  Final  Blood culture (routine x 2)     Status: None (Preliminary result)   Collection Time: 12/20/17  2:39 PM  Result Value Ref Range Status   Specimen Description BLOOD Blood Culture adequate volume  Final   Special Requests BLOOD LEFT FOREARM  Final   Culture   Final    NO GROWTH 3 DAYS Performed at [facility], [address], [place], [place] [address]    Report Status PENDING  Incomplete  Blood culture (routine x 2)     Status: None (Preliminary result)   Collection Time: 12/20/17  2:39 PM  Result Value Ref Range Status   Specimen Description BLOOD Blood Culture adequate volume  Final   Special Requests BLOOD RIGHT FOREARM  Final   Culture   Final    NO GROWTH 3 DAYS Performed at [facility], [address] [place], [place] [address]    Report Status PENDING  Incomplete  MRSA PCR  Screening     Status: None   Collection Time: 12/23/17 12:44 AM  Result Value Ref Range Status   MRSA by PCR NEGATIVE NEGATIVE Final    Comment:        The GeneXpert MRSA Assay (FDA approved for NASAL specimens only), is one component of a comprehensive MRSA colonization surveillance program. It is not intended to diagnose MRSA infection nor to guide or monitor treatment for MRSA infections. Performed at [facility], [address] [place], [place] [address]         Code Status Orders  (From admission, onward)        Start     Ordered   12/20/17 1852  Full code  Continuous     12/20/17 1851    Code Status History    Date Active Date Inactive Code Status Order ID Comments User Context   09/08/2017 06:44 09/10/2017 21:27 Full Code [id number]  Harrie Foreman, MD Inpatient   07/29/2017 10:44 07/31/2017 21:52 Full Code  [id number]  Henreitta Leber, MD Inpatient   06/19/2017 00:27 06/20/2017 21:14 Full Code [id number]  Lance Coon, MD ED   05/20/2017 12:26 05/21/2017 17:59 Full Code [id number]  [name], MD Inpatient   06/22/2016 04:30 06/22/2016 21:50 Full Code [id number]  Quintella Baton, MD Inpatient   06/04/2016 18:43 06/06/2016 18:15 Full Code [id number]  [name], MD Inpatient            Discharge Medications   Allergies as of 12/23/2017      Reactions   Lisinopril Cough      Medication List    STOP taking these medications   amiodarone 200 MG tablet Commonly known as:  PACERONE   sacubitril-valsartan 49-51 MG Commonly known as:  ENTRESTO   spironolactone 25 MG tablet Commonly known as:  ALDACTONE   torsemide 20 MG tablet Commonly known as:  DEMADEX   traMADol 50 MG tablet Commonly known as:  ULTRAM     TAKE these medications   acetaminophen 500 MG tablet Commonly known as:  TYLENOL Take 1 capsule by mouth every 4 (four) hours as needed for fever.   albuterol 108 (90 Base) MCG/ACT inhaler Commonly known as:  PROVENTIL HFA;VENTOLIN HFA Inhale 2 puffs into the lungs every 4 (four) hours as needed for wheezing or shortness of breath.   amiodarone 360-4.14 MG/200ML-% Soln Commonly known as:  NEXTERONE PREMIX Inject 33.33 mL/hr into the vein continuous.   atorvastatin 40 MG tablet Commonly known as:  LIPITOR Take 1 tablet (40 mg total) by mouth daily at 6 PM. What changed:  when to take this   carvedilol 6.25 MG tablet Commonly known as:  COREG Take 1 tablet (6.25 mg total) 2 (two) times daily by mouth.   Chest Rub 4.8-1.2-2.6 % Oint Apply 1 application topically daily as needed (congestino).   clopidogrel 75 MG tablet Commonly known as:  PLAVIX Take 1 tablet (75 mg total) by mouth daily.   cyclobenzaprine 10 MG tablet Commonly known as:  FLEXERIL Take 1 tablet (10 mg total) by mouth 3 (three) times daily as needed for muscle spasms.   diclofenac sodium  1 % Gel Commonly known as:  VOLTAREN Apply 2 g topically 4 (four) times daily as needed (pain).   docusate sodium 100 MG capsule Commonly known as:  COLACE Take 1 capsule (100 mg total) by mouth 2 (two) times daily as needed for mild constipation.   fluticasone 50 MCG/ACT nasal spray Commonly known as:  [place]  2 sprays into both nostrils daily. What changed:    when to take this  reasons to take this   Fluticasone-Salmeterol 250-50 MCG/DOSE Aepb Commonly known as:  ADVAIR DISKUS Inhale 1 puff into the lungs 2 (two) times daily.   ivabradine 5 MG Tabs tablet Commonly known as:  CORLANOR Take 1 tablet (5 mg total) by mouth 2 (two) times daily with a meal.   nitroGLYCERIN 0.4 MG SL tablet Commonly known as:  NITROSTAT Place 1 tablet (0.4 mg total) under the tongue every 5 (five) minutes as needed for chest pain.   pantoprazole 40 MG tablet Commonly known as:  PROTONIX Take 1 tablet (40 mg total) by mouth daily.   phenylephrine 10 mg in sodium chloride 0.9 % 250 mL Inject 0-400 mcg/min into the vein continuous.   polyethylene glycol packet Commonly known as:  MIRALAX / GLYCOLAX Take 17 g by mouth daily. What changed:    when to take this  reasons to take this   SPIRIVA HANDIHALER 18 MCG inhalation capsule Generic drug:  tiotropium Place 18 mcg daily into inhaler and inhale.          Total Time in preparing paper work, data evaluation and todays exam - 35 minutes  [name] M.D on 12/23/2017 at 9:07 AM  [facility]   Office  [phone]

## 2017-12-23 NOTE — Procedures (Signed)
Pulmonary Artery Catheter Insertion Procedure Note CYLIS AYARS [id number] 1966-10-05  Procedure: Insertion of Pulmonary Artery Catheter Indications: Cardiogenic shock  Procedure Details Consent: Risks of procedure as well as the alternatives and risks of each were explained to the (patient/caregiver).  Consent for procedure obtained. Time Out: Verified patient identification, verified procedure, site/side was marked, verified correct patient position, special equipment/implants available, medications/allergies/relevent history reviewed, required imaging and test results available.  Performed  Maximum sterile technique was used including antiseptics, cap, gloves, gown, hand hygiene, mask and sheet. Skin prep: Chlorhexidine; local anesthetic administered A antimicrobial bonded/coated single lumen sheath was placed in the right internal jugular vein using the Seldinger technique. Once the sheath was placed a Swan-Ganz catheter was maneuvered into the pulmonary artery using pressure waveform guidance.   Evaluation Blood flow good Complications: No apparent complications Patient did tolerate procedure well. Chest X-ray ordered to verify placement.  CXR: normal.  Glori Bickers 12/23/2017, 2:51 PM

## 2017-12-23 NOTE — H&P (Signed)
Cardiology Admission History and Physical:   Patient ID: Stephen [name]; MRN: [id number]; DOB: Nov 14, 1966   Admission date: 12/23/2017  Primary Care Provider: [name], MD Primary Cardiologist: Dr Fletcher Anon Primary Electrophysiologist:  Dr Caryl Comes  Chief Complaint:  CHF, VF arrest  Patient Profile:   Stephen [name] is a 51 y/o male with longstanding NICM and systolic HF transferred from [facility] for management of worsening HF, Shock, Influenzae A and VF arrest   History of Present Illness:   Stephen [name] is a 89 y.o. AA male with a history of NICM. He had a cath in Sept 2018 by Dr Ubaldo Glassing. He is followed by Dr Fletcher Anon in [place]. He actually just saw Dr Caryl Comes in the office 12/19/17 for evaluation of an ICD, The pt apparently has frequent PVCs and Dr Caryl Comes recommended PVC suppression with Amiodarone and then re evaluate his LVF. On 12/20/17 the pt presented to [facility] ED with SOB and cough. He had CHF, possible superimposed CAP, and was positive for influenza A. He was admitted for ABXs and diuresis. Last night he says he had chest pressure and then had a VF arrest. He was shocked once, not intubated. He was hypotensive post shock and was placed on IV Neo-Synephrine. An IV Amiodarone drip was started and the pt is transferred now to [facility] for further evaluation and treatment. He is currently stable sitting up in a recliner but gets SOB when lying flat flat.    Past Medical History:  Diagnosis Date  . Arrhythmia   . Cardiac arrest ([place]) 12/23/2017   Brief V-fib arrest  . CHF (congestive heart failure) (HCC)    nonischemic cardiomyopathy, EF 25%  . Hypertension   . TIA (transient ischemic attack) 06/21/16    Past Surgical History:  Procedure Laterality Date  . KNEE SURGERY Right   . LEFT HEART CATH AND CORONARY ANGIOGRAPHY N/A 09/09/2017   Procedure: LEFT HEART CATH AND CORONARY ANGIOGRAPHY;  Surgeon: Teodoro Spray, MD;  Location: [place] [facility];  Service: Cardiovascular;   Laterality: N/A;  . VASECTOMY       Medications Prior to Admission: Prior to Admission medications   Medication Sig Start Date End Date Taking? Authorizing Provider  acetaminophen (TYLENOL) 500 MG tablet Take 1 capsule by mouth every 4 (four) hours as needed for fever.    [provider]  albuterol (PROVENTIL HFA;VENTOLIN HFA) 108 (90 Base) MCG/ACT inhaler Inhale 2 puffs into the lungs every 4 (four) hours as needed for wheezing or shortness of breath. 03/22/17   [name], MD  amiodarone (NEXTERONE PREMIX) 360-4.14 MG/200ML-% SOLN Inject 33.33 mL/hr into the vein continuous. 12/23/17   [name], MD  atorvastatin (LIPITOR) 40 MG tablet Take 1 tablet (40 mg total) by mouth daily at 6 PM. Patient taking differently: Take 40 mg by mouth daily.  06/22/16   [name], MD  Camphor-Eucalyptus-Menthol (CHEST RUB) 4.8-1.2-2.6 % OINT Apply 1 application topically daily as needed (congestino).     [provider]  carvedilol (COREG) 6.25 MG tablet Take 1 tablet (6.25 mg total) 2 (two) times daily by mouth. 11/12/17   Wellington Hampshire, MD  clopidogrel (PLAVIX) 75 MG tablet Take 1 tablet (75 mg total) by mouth daily. 06/22/16   [name], MD  cyclobenzaprine (FLEXERIL) 10 MG tablet Take 1 tablet (10 mg total) by mouth 3 (three) times daily as needed for muscle spasms. 09/10/17   [name], MD  diclofenac sodium (VOLTAREN) 1 % GEL Apply 2 g topically  4 (four) times daily as needed (pain).     [provider]  docusate sodium (COLACE) 100 MG capsule Take 1 capsule (100 mg total) by mouth 2 (two) times daily as needed for mild constipation. 09/10/17   [name], [name], MD  fluticasone (FLONASE) 50 MCG/ACT nasal spray Place 2 sprays into both nostrils daily. Patient taking differently: Place 2 sprays into both nostrils daily as needed for allergies.  06/20/17   [name], MD  Fluticasone-Salmeterol (ADVAIR DISKUS) 250-50 MCG/DOSE AEPB Inhale 1 puff into the  lungs 2 (two) times daily. Patient not taking: Reported on 12/20/2017 11/25/17 11/25/18  [name], MD  ivabradine (CORLANOR) 5 MG TABS tablet Take 1 tablet (5 mg total) by mouth 2 (two) times daily with a meal. 10/08/17   Wellington Hampshire, MD  nitroGLYCERIN (NITROSTAT) 0.4 MG SL tablet Place 1 tablet (0.4 mg total) under the tongue every 5 (five) minutes as needed for chest pain. 07/31/17   [name], MD  pantoprazole (PROTONIX) 40 MG tablet Take 1 tablet (40 mg total) by mouth daily. 06/20/17   [name], MD  phenylephrine 10 mg in sodium chloride 0.9 % 250 mL Inject 0-400 mcg/min into the vein continuous. 12/23/17   [name], MD  polyethylene glycol [facility] / Floria Raveling) packet Take 17 g by mouth daily. Patient taking differently: Take 17 g by mouth daily as needed for mild constipation.  09/11/17   [name], [name], MD  tiotropium (SPIRIVA HANDIHALER) 18 MCG inhalation capsule Place 18 mcg daily into inhaler and inhale.  06/20/17   [provider]     Allergies:    Allergies  Allergen Reactions  . Lisinopril Cough    Social History:   Social History   Socioeconomic History  . Marital status: Single    Spouse name: Not on file  . Number of children: Not on file  . Years of education: Not on file  . Highest education level: Not on file  Social Needs  . Financial resource strain: Not on file  . Food insecurity - worry: Not on file  . Food insecurity - inability: Not on file  . Transportation needs - medical: Not on file  . Transportation needs - non-medical: Not on file  Occupational History  . Not on file  Tobacco Use  . Smoking status: Former Smoker    Packs/day: 0.25    Years: 20.00    Pack years: 5.00    Types: Cigarettes    Last attempt to quit: 02/20/2016    Years since quitting: 1.8  . Smokeless tobacco: Never Used  Substance and Sexual Activity  . Alcohol use: Yes    Alcohol/week: 0.0 oz    Comment: beer occasional  . Drug use: No  . Sexual  activity: Not on file  Other Topics Concern  . Not on file  Social History Narrative   Independent at baseline    Family History:   The patient's family history includes CAD in his father; Heart attack in his father; Heart failure in his mother; Hypertension in his father and mother.    ROS:  Please see the history of present illness.  All other ROS reviewed and negative.     Physical Exam/Data:   Vitals:   12/23/17 1100 12/23/17 1144  BP: (!) 91/43   Temp:  97.6 F (36.4 C)  TempSrc:  Oral    Intake/Output Summary (Last 24 hours) at 12/23/2017 1154 Last data filed at 12/23/2017 1100 Gross per 24 hour  Intake 63.3 ml  Output -  Net 63.3 ml   Filed Weights   12/23/17 1130  Weight: 113.4 kg (250 lb 0 oz)   Body mass index is 32.1 kg/m.  General:  Well nourished, well developed, lying in bed + increased WOB HEENT: normal Lymph: no adenopathy Neck: JVP to ear Endocrine:  No thryomegaly Vascular: No carotid bruits; FA pulses 2+ bilaterally without bruits  Cardiac:  PMI laterally displaced tachy + s3 Lungs:  rales and rhonchi 1/3 up bilateraly Abd: soft, non tender, liver down RCM + markedly distended Ext: cool trace edema Musculoskeletal:  No deformities, BUE and BLE strength normal and equal Skin: cool and dry  Neuro:  CNs 2-12 intact, no focal abnormalities noted Psych:  Normal affect    EKG:  The ECG that was done 12/23/17 was personally reviewed and demonstrates NSR 60 with IVCD and PVCs  Relevant CV Studies: Echo 10/25/17 Study Conclusions  - Left ventricle: The cavity size was severely dilated. Systolic   function was severely reduced. The estimated ejection fraction   was <20% Diffuse hypokinesis. Spontaneous contrast noted in the   LV. Features are consistent with a pseudonormal left ventricular   filling pattern, with concomitant abnormal relaxation and   increased filling pressure (grade 2 diastolic dysfunction). - Mitral valve: There was mild to  moderate regurgitation. - Left atrium: The atrium was moderately dilated. - Right ventricle: The cavity size was mildly dilated. Wall   thickness was normal. - Pulmonary arteries: Systolic pressure was moderately elevated. PA   peak pressure: 49 mm Hg (S). - Inferior vena cava: The vessel was dilated. The respirophasic   diameter changes were blunted (< 50%), consistent with elevated   central venous pressure.  Laboratory Data:  Chemistry Recent Labs  Lab 12/23/17 0034 12/23/17 0706  NA 130* 127*  K 3.4* 4.8  CL 92* 94*  CO2 26 22  GLUCOSE 125* 145*  BUN 44* 47*  CREATININE 1.86* 2.22*  CALCIUM 8.5* 8.6*  GFRNONAA 40* 32*  GFRAA 46* 37*  ANIONGAP 12 11    Recent Labs  Lab 12/20/17 1438 12/23/17 0034  PROT 7.1 6.5  ALBUMIN 4.1 3.3*  AST 40 84*  ALT 20 56  ALKPHOS 55 50  BILITOT 1.9* 0.8   Hematology Recent Labs  Lab 12/21/17 0347 12/23/17 0034  WBC 6.1 5.7  RBC 4.74 4.97  HGB 14.1 14.6  HCT 43.2 45.1  MCV 91.0 90.9  MCH 29.7 29.4  MCHC 32.6 32.3  RDW 15.2* 15.5*  PLT 120* 118*   Cardiac Enzymes Recent Labs  Lab 12/23/17 0034 12/23/17 0706  TROPONINI 0.09* 0.15*   No results for input(s): TROPIPOC in the last 168 hours.  BNP Recent Labs  Lab 12/23/17 0034  BNP 1,017.0*    DDimer No results for input(s): DDIMER in the last 168 hours.  Radiology/Studies:  Dg Chest Port 1 View  Result Date: 12/23/2017 CLINICAL DATA:  51 year old male status post cardiac arrest. EXAM: PORTABLE CHEST 1 VIEW COMPARISON:  Chest radiograph dated 12/20/2017 FINDINGS: There is moderate cardiomegaly with findings of vascular congestion and pulmonary edema as seen previously. Overall no significant interval change compared to the earlier radiograph. For no pneumothorax. No acute osseous pathology. IMPRESSION: Cardiomegaly with findings of CHF and pulmonary edema. Overall no significant interval change. Electronically Signed   By: Anner Crete M.D.   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 SCr 1.5 on admission- 2.22 today  Acute hypoxic respiratory failure - Likely due to CHF but also combination of flu/PNA  Plan: Dr Haroldine Laws to see. Continue IV Amiodarone and Neo for now.   Severity of Illness: The appropriate patient status for this patient is INPATIENT. Inpatient status is judged to be reasonable and necessary in order to provide the required intensity of service to ensure the patient's safety. The patient's presenting symptoms, physical exam findings, and initial radiographic and laboratory data in the context of their chronic comorbidities is felt to place them at high risk for further clinical deterioration. Furthermore, it is not anticipated that the patient will be medically stable for discharge from the hospital within 2 midnights of admission. The following factors support the patient status of inpatient.   " The patient's presenting symptoms include SOB. " The worrisome physical exam findings include rales, VF. " The initial radiographic and laboratory data are worrisome because of CHF. " The chronic co-morbidities include renal insuficiency.   * I certify that at the point of admission it is my clinical judgment that the patient will require inpatient hospital care spanning beyond 2 midnights from the point of admission due to high intensity of service, high risk for further deterioration and high frequency of surveillance required.*    For questions or updates, please contact [place] Please consult [email or web address] for contact info under Cardiology/STEMI.    Signed, Stephen Ransom, PA-C  12/23/2017 11:54 AM   .Agree with above  51 y/o male with h/o CHF due to NICM. Has been doing relatively well until  last week or two. Saw [name] last week and started on amio for frequent PVCs. At that time says he also started having flu-like symptoms. Presented to [facility] with progressive respiratory failure with sats in 70-80s. CXR c/w pulmonary edema and possible PNA. Flu swab + for influenzae A and started on Tamiflu.   Overnight developed VT/VF and shocked x 1. Hypotensive afterwards and started on new. Transferred to [facility].   On arrival very SOB and orthopneic.   Exam: CVP markedly elevated. Tachycardic and tachypneic with prominent s3. Ab distended. Extremities cool   Creatinine worsening and no response to IV lasix. All suggestive of cardiogenic shock.   Swan placed emergently with MV sat at 45%. Neo stopped and switched to norepi and milrinone.   PCT elevated at 1.2 so will cover for possible CAP as well.   Will continue amio for VT/VF. Keep K > 4.0 Mg > 2.0  He is critically ill and at high risk for intubation and death. D/w family at bedside.   CRITICAL CARE Performed by: Glori Bickers  Total critical care time: 55 minutes  Critical care time was exclusive of separately billable procedures and treating other patients.  Critical care was necessary to treat or prevent imminent or life-threatening deterioration.  Critical care was time spent personally by me (independent of midlevel providers or residents) on the following activities: development of treatment plan with patient and/or surrogate as well as nursing, discussions with consultants, evaluation of patient's response to treatment, examination of patient, obtaining history from patient or surrogate, ordering and performing treatments and interventions, ordering and review of laboratory studies, ordering and review of radiographic studies, pulse oximetry and re-evaluation of patient's condition.  Glori Bickers, MD  3:59 PM

## 2017-12-23 NOTE — Consult Note (Signed)
PULMONARY / CRITICAL CARE MEDICINE   Name: TRESHAWN ALLEN MRN: [id number] DOB: 03/01/1966    ADMISSION DATE:  12/20/2017   CONSULTATION DATE:  12/24/201/8  REFERRING MD:  Dr. Jerelyn Charles  Reason: Cardiac arrest  HISTORY OF PRESENT ILLNESS: This is a 51 year old African-American male with a known history of nonischemic cardiomyopathy, systolic heart failure with an EF of 20-25%, hypertension, and TIA who was admitted on 20 December 2017 with influenza a pneumonia.  Yesterday he developed an arrhythmia with multiple PVCs.  He was started on oral amiodarone.  He takes Coreg, Plavix, Lipitor, Entresto, Aldactone, torsemide, and ivabradine at home.  He is being followed by cardiology during this hospitalization.  Today at about midnight, patient went into a brief V. fib arrest.  He was shocked by x1 and CPR performed for less than 2 minutes.  He is currently awake and breathing on his own.  He reports chest pain that is midsternal and radiating to his back.  He denies any associated nausea dizziness and headache. Patient had a cardiac catheterization on 10 September 2007.  No atherosclerosis was found.  Last echogram was on 26 October showed moderate pulmonary hypertension and significantly reduced left ventricular ejection fraction of 20-25%.  PAST MEDICAL HISTORY :  He  has a past medical history of Arrhythmia, Cardiac arrest ([place]) (12/23/2017), CHF (congestive heart failure) ([place]), Hypertension, and TIA (transient ischemic attack) (06/21/16).  PAST SURGICAL HISTORY: He  has a past surgical history that includes Knee surgery (Right); Vasectomy; and LEFT HEART CATH AND CORONARY ANGIOGRAPHY (N/A, 09/09/2017).  Allergies  Allergen Reactions  . Lisinopril Cough    No current facility-administered medications on file prior to encounter.    Current Outpatient Medications on File Prior to Encounter  Medication Sig  . atorvastatin (LIPITOR) 40 MG tablet Take 1 tablet (40 mg total) by mouth daily at 6  PM. (Patient taking differently: Take 40 mg by mouth daily. )  . carvedilol (COREG) 6.25 MG tablet Take 1 tablet (6.25 mg total) 2 (two) times daily by mouth.  . clopidogrel (PLAVIX) 75 MG tablet Take 1 tablet (75 mg total) by mouth daily.  . cyclobenzaprine (FLEXERIL) 10 MG tablet Take 1 tablet (10 mg total) by mouth 3 (three) times daily as needed for muscle spasms.  . ivabradine (CORLANOR) 5 MG TABS tablet Take 1 tablet (5 mg total) by mouth 2 (two) times daily with a meal.  . pantoprazole (PROTONIX) 40 MG tablet Take 1 tablet (40 mg total) by mouth daily.  . sacubitril-valsartan (ENTRESTO) 49-51 MG Take 1 tablet by mouth 2 (two) times daily.  Marland Kitchen spironolactone (ALDACTONE) 25 MG tablet Take 1 tablet (25 mg total) by mouth daily.  Marland Kitchen tiotropium (SPIRIVA HANDIHALER) 18 MCG inhalation capsule Place 18 mcg daily into inhaler and inhale.   . torsemide (DEMADEX) 20 MG tablet Take 40mg  (2 tablets) in the morning and 20mg  (1 tablet) in the evening. (Patient taking differently: Take 80 mg by mouth daily. Take 40mg  (2 tablets) in the morning and 20mg  (1 tablet) in the evening.)  . traMADol (ULTRAM) 50 MG tablet Take 1 tablet (50 mg total) by mouth every 6 (six) hours as needed. (Patient taking differently: Take 50 mg by mouth every 6 (six) hours as needed for moderate pain. )  . acetaminophen (TYLENOL) 500 MG tablet Take 1 capsule by mouth every 4 (four) hours as needed for fever.  Marland Kitchen albuterol (PROVENTIL HFA;VENTOLIN HFA) 108 (90 Base) MCG/ACT inhaler Inhale 2 puffs into the lungs every  4 (four) hours as needed for wheezing or shortness of breath.  Marland Kitchen amiodarone (PACERONE) 200 MG tablet Take 2 tablets (400 mg) by mouth twice daily x 2 weeks, then take 2 tablets (400 mg) by mouth once daily x 2 weeks (Patient not taking: Reported on 12/20/2017)  . amiodarone (PACERONE) 200 MG tablet Take 1 tablet (200 mg total) by mouth daily. (Patient not taking: Reported on 12/20/2017)  . Camphor-Eucalyptus-Menthol (CHEST RUB)  4.8-1.2-2.6 % OINT Apply 1 application topically daily as needed (congestino).   Marland Kitchen diclofenac sodium (VOLTAREN) 1 % GEL Apply 2 g topically 4 (four) times daily as needed (pain).   Marland Kitchen docusate sodium (COLACE) 100 MG capsule Take 1 capsule (100 mg total) by mouth 2 (two) times daily as needed for mild constipation.  . fluticasone (FLONASE) 50 MCG/ACT nasal spray Place 2 sprays into both nostrils daily. (Patient taking differently: Place 2 sprays into both nostrils daily as needed for allergies. )  . Fluticasone-Salmeterol (ADVAIR DISKUS) 250-50 MCG/DOSE AEPB Inhale 1 puff into the lungs 2 (two) times daily. (Patient not taking: Reported on 12/20/2017)  . nitroGLYCERIN (NITROSTAT) 0.4 MG SL tablet Place 1 tablet (0.4 mg total) under the tongue every 5 (five) minutes as needed for chest pain.  . polyethylene glycol (MIRALAX / GLYCOLAX) packet Take 17 g by mouth daily. (Patient taking differently: Take 17 g by mouth daily as needed for mild constipation. )    FAMILY HISTORY:  His indicated that his mother is deceased. He indicated that his father is deceased.   SOCIAL HISTORY: He  reports that he quit smoking about 22 months ago. His smoking use included cigarettes. He has a 5.00 pack-year smoking history. he has never used smokeless tobacco. He reports that he drinks alcohol. He reports that he does not use drugs.  REVIEW OF SYSTEMS:   Constitutional: Negative for fever and chills.  HENT: Negative for congestion and rhinorrhea.  Eyes: Negative for redness and visual disturbance.  Respiratory: Positive for mild shortness of breath but negative for wheezing.  Cardiovascular: Positive for midsternal chest pain but  Negative  palpitations.  Gastrointestinal: Negative  for nausea , vomiting and abdominal pain and  Loose stools Genitourinary: Negative for dysuria and urgency.  Endocrine: Denies polyuria, polyphagia and heat intolerance Musculoskeletal: Negative for myalgias and arthralgias.  Skin:  Negative for pallor and wound.  Neurological: Negative for dizziness and headaches   SUBJECTIVE:   VITAL SIGNS: BP 93/61 (BP Location: Right Arm)   Pulse 71   Temp 98.5 F (36.9 C) (Oral)   Resp (!) 28 Comment: RN Tricia Notified @ this time.  Ht 6\' 1"  (1.854 m)   Wt 113.4 kg (250 lb)   SpO2 100%   BMI 32.98 kg/m   HEMODYNAMICS:    VENTILATOR SETTINGS:    INTAKE / OUTPUT: I/O last 3 completed shifts: In: 856 [P.O.:600; I.V.:3; IV Piggyback:350] Out: 2575 [Urine:2575]  PHYSICAL EXAMINATION: General: Well-nourished, well-developed, in no acute distress Neuro: Alert and oriented x4, cranial nerves intact HEENT: PERRLA, neck is supple with no JVD Cardiovascular: Apical pulse irregular, S1-S2, no murmur regurg or gallop, +2 pulses bilaterally, +1 nonpitting edema in lower extremities Lungs: Lateral breath sounds, diminished in the bases, no wheezing Abdomen: Nondistended, normal bowel sounds in all 4 quadrants Musculoskeletal: Positive range of motion in upper and lower extremities no joint deformities Skin: Warm and dry  LABS:  BMET Recent Labs  Lab 12/20/17 1438 12/21/17 0347 12/22/17 0339  NA 136 134* 134*  K  3.4* 3.4* 3.9  CL 100* 101 99*  CO2 22 23 25   BUN 22* 28* 38*  CREATININE 1.41* 1.50* 1.63*  GLUCOSE 127* 140* 124*    Electrolytes Recent Labs  Lab 12/20/17 1438 12/21/17 0347 12/22/17 0339  CALCIUM 9.2 8.8* 8.7*    CBC Recent Labs  Lab 12/20/17 1438 12/21/17 0347  WBC 7.9 6.1  HGB 15.3 14.1  HCT 46.1 43.2  PLT 142* 120*    Coag's No results for input(s): APTT, INR in the last 168 hours.  Sepsis Markers Recent Labs  Lab 12/20/17 1439 12/20/17 1850 12/21/17 0347  LATICACIDVEN 1.9 2.7*  --   PROCALCITON  --   --  1.18    ABG Recent Labs  Lab 12/20/17 2102  PHART 7.43  PCO2ART 35  PO2ART 109*    Liver Enzymes Recent Labs  Lab 12/20/17 1438  AST 40  ALT 20  ALKPHOS 55  BILITOT 1.9*  ALBUMIN 4.1    Cardiac  Enzymes No results for input(s): TROPONINI, PROBNP in the last 168 hours.  Glucose No results for input(s): GLUCAP in the last 168 hours.  Imaging No results found.   STUDIES:  2D echo pending  CULTURES: Blood cultures x2  ANTIBIOTICS: Doxycycline Oseltamivir   SIGNIFICANT EVENTS: 12/20/2017: Admitted 12/23/2017: Brief cardiac alert arrest, transferred to the ICU  LINES/TUBES: Peripheral IVs Foley  DISCUSSION: 51 year old African-American male with a history of nonischemic cardiomyopathy presenting with a witnessed cardiac arrest with effective resuscitation.  Now on room air and maintaining his airway  ASSESSMENT  Cardiac arrest -V. fib arrest; total downtime less than 5 minutes Acute on chronic systolic heart failure-recent LV EF: 20% -   25% Cardiac arrhythmia Influenza A pneumonia History of hypertension History of hyperlipidemia History of TIA  PLAN Hemodynamic monitoring per ICU protocol Post code labs and EKG-reviewed Hold off on IV hydration given low EF and pulmonary vascular congestion on chest x-ray Amiodarone infusion DC p.o. amiodarone Chest x-ray-reviewed Phenylephrine infusion to maintain mean arterial pressure greater than 65 Continue all current medications Cardiology following patient already; spoke with Dr. Fletcher Anon who recommended continuing amiodarone infusion and starting pressors should patient become hypotensive Cycle cardiac enzymes Monitor and replace electrolytes With deep defibrillation pads at bedside or on the patient GI and DVT prophylaxis Patient is a full code  FAMILY  - Updates: Ivin Booty updated on current treatment plan.  All questions answered Valdis Bevill S. [facility] ANP-BC Pulmonary and Critical Care Medicine [facility] Pager [phone] or [phone] 12/23/2017, 12:36 AM

## 2017-12-23 NOTE — Progress Notes (Signed)
Patient admitted for cough and flu-symptoms found to be flu-positive. Has cardiomegaly and pulmonary edema on CXR s/t pneumonia and CHF exacerbation. Patient received azithromycin/ceftriaxone for CAP pneumonia is also on carvedilol and ivrabadine for heart failure.  While on floor patient coded and went into vfib and was shocked back to rhythm (alert/oriented, not intubated) and is now on amiodarone drip for vfib. Patient appears to have been on amiodarone 400 mg PTA, but med rec states not taking.  Patient then transferred to ICU and is hypotensive s/t cardiogenic shock. On EKG post-CA patient had a QTc of 530. Spoke to NP to switch azithromycin to doxycyline since QTc is prolonged. Also recommended to hold carvedilol/ivabradine for cardiogenic shock/QTc, NP states cardiology recommended to continue these.  NP notified about antibiotic switch and agrees with plan.  Tobie Lords, PharmD, BCPS Clinical Pharmacist 12/23/2017

## 2017-12-23 NOTE — Progress Notes (Signed)
eLink Physician-Brief Progress Note Patient Name: Stephen [name] DOB: [birth year] MRN: [id number]   Date of Service  12/23/2017  HPI/Events of Note  Pt wit chf admit with possible influenza then vf arrest > defib/ rx amio > moved to ICU but did not req intubation and now appears comfortable on camera eval / sats ok on NP  eICU Interventions  PCCM eval in progress at bedside No escalation of care needed for now/ will continue to monitor     Intervention Category Major Interventions: Arrhythmia - evaluation and management  Christinia Gully 12/23/2017, 12:47 AM

## 2017-12-23 NOTE — Progress Notes (Signed)
[facility] has loaded pt. All forms filled out for transfer. [facility] (Shammie RN ) was called report and updated just prior to transfer. Dr Fletcher Anon stated he will call pt sister.  Pt called a few friends and family prior to transfer to [facility]. He is alert and oriented. Pain a "2" low back area. SB to SR with BBB and PAC's. Neo at 97mcg. And Amiodarone gtt at 60mg /hr.

## 2017-12-23 NOTE — Progress Notes (Signed)
Progress Note  Patient Name: Stephen [name] Date of Encounter: 12/23/2017  Primary Cardiologist: Fletcher Anon   Subjective   The patient had ventricular fibrillation arrest shortly after midnight.  He was defibrillated to sinus rhythm with brief CPR.  He was transferred to the ICU.  Rhythm strips were not saved as far as I can tell.  The patient's blood pressure was on the low side and he was started on small dose Neo-Synephrine.  He continues to have shortness of breath at rest.  He complains of sore chest from brief CPR.  Renal function is worse.  Inpatient Medications    Scheduled Meds: . atorvastatin  40 mg Oral Daily  . carvedilol  3.125 mg Oral BID  . clopidogrel  75 mg Oral Daily  . furosemide  40 mg Intravenous BID  . heparin  5,000 Units Subcutaneous Q8H  . lidocaine  2 patch Transdermal Q24H  . oseltamivir  30 mg Oral BID  . pantoprazole  40 mg Oral Daily  . pneumococcal 23 valent vaccine  0.5 mL Intramuscular Tomorrow-1000  . sodium chloride flush  3 mL Intravenous Q12H  . tiotropium  18 mcg Inhalation Daily   Continuous Infusions: . sodium chloride    . amiodarone 60 mg/hr (12/23/17 0900)  . phenylephrine (NEO-SYNEPHRINE) Adult infusion 20 mcg/min (12/23/17 0730)   PRN Meds: sodium chloride, acetaminophen **OR** acetaminophen, albuterol, bisacodyl, diclofenac sodium, docusate sodium, guaiFENesin, HYDROcodone-acetaminophen, ipratropium-albuterol, morphine injection, nitroGLYCERIN, ondansetron **OR** ondansetron (ZOFRAN) IV, polyethylene glycol, sodium chloride flush, traMADol   Vital Signs    Vitals:   12/23/17 0615 12/23/17 0645 12/23/17 0700 12/23/17 0730  BP: 90/74 (!) 87/72 (!) 88/67 93/78  Pulse: (!) 54 (!) 134 61 64  Resp: (!) 9 15 (!) 28 (!) 28  Temp:    (!) 97.4 F (36.3 C)  TempSrc:    Oral  SpO2: 100% 100% (!) 83% 95%  Weight:      Height:        Intake/Output Summary (Last 24 hours) at 12/23/2017 0922 Last data filed at 12/23/2017 0730 Gross  per 24 hour  Intake 782.13 ml  Output 1750 ml  Net -967.87 ml   Filed Weights   12/20/17 1322  Weight: 250 lb (113.4 kg)    Telemetry    NSR with PVCs - Personally Reviewed  ECG     Physical Exam   GEN: No acute distress.   Neck: mild JVD Cardiac: RRR , no murmurs, rubs, or gallops.  Respiratory: bibasilar crackles GI: Soft, nontender, non-distended  MS: No edema; No deformity. Neuro:  Nonfocal  Psych: Normal affect   Labs    Chemistry Recent Labs  Lab 12/20/17 1438  12/22/17 0339 12/23/17 0034 12/23/17 0706  NA 136   < > 134* 130* 127*  K 3.4*   < > 3.9 3.4* 4.8  CL 100*   < > 99* 92* 94*  CO2 22   < > 25 26 22   GLUCOSE 127*   < > 124* 125* 145*  BUN 22*   < > 38* 44* 47*  CREATININE 1.41*   < > 1.63* 1.86* 2.22*  CALCIUM 9.2   < > 8.7* 8.5* 8.6*  PROT 7.1  --   --  6.5  --   ALBUMIN 4.1  --   --  3.3*  --   AST 40  --   --  84*  --   ALT 20  --   --  56  --  ALKPHOS 55  --   --  50  --   BILITOT 1.9*  --   --  0.8  --   GFRNONAA 56*   < > 47* 40* 32*  GFRAA >60   < > 54* 46* 37*  ANIONGAP 14   < > 10 12 11    < > = values in this interval not displayed.     Hematology Recent Labs  Lab 12/20/17 1438 12/21/17 0347 12/23/17 0034  WBC 7.9 6.1 5.7  RBC 5.13 4.74 4.97  HGB 15.3 14.1 14.6  HCT 46.1 43.2 45.1  MCV 89.9 91.0 90.9  MCH 29.7 29.7 29.4  MCHC 33.1 32.6 32.3  RDW 15.2* 15.2* 15.5*  PLT 142* 120* 118*    Cardiac Enzymes Recent Labs  Lab 12/23/17 0034 12/23/17 0706  TROPONINI 0.09* 0.15*   No results for input(s): TROPIPOC in the last 168 hours.   BNP Recent Labs  Lab 12/23/17 0034  BNP 1,017.0*     DDimer No results for input(s): DDIMER in the last 168 hours.   Radiology    Dg Chest Port 1 View  Result Date: 12/23/2017 CLINICAL DATA:  51 year old male status post cardiac arrest. EXAM: PORTABLE CHEST 1 VIEW COMPARISON:  Chest radiograph dated 12/20/2017 FINDINGS: There is moderate cardiomegaly with findings of vascular  congestion and pulmonary edema as seen previously. Overall no significant interval change compared to the earlier radiograph. For no pneumothorax. No acute osseous pathology. IMPRESSION: Cardiomegaly with findings of CHF and pulmonary edema. Overall no significant interval change. Electronically Signed   By: Anner Crete M.D.   On: 12/23/2017 01:11    Cardiac Studies    Patient Profile     51 y.o. male chronic systolic heart failure, Previous TIA and CKD who presented with the flu and decompensated heart failure  Assessment & Plan     1. Acute on chronic systolic heart failure with severely reduced LV systolic function: Decompensation in the setting of the flu.  Unfortunately, the patient suffered ventricular fibrillation overnight.  He is alert and oriented at the present time.  However, he is showing signs of progressive hypoperfusion and continues to be volume overloaded in spite of intravenous diuresis.  Due to low blood pressure, we have to hold his heart failure medications.  Progressive worsening of renal function and hyponatremia are very worrisome overall.  The patient will likely require treatment with an inotrope.  Probably best to transfer to [facility], perform a right heart cath to guide inotropic treatment.  I discussed the case with Dr. Haroldine Laws who accepted the patient.  As the patient was on optimal medical therapy before current hospitalization with no significant improvement in symptoms, I suspect that he will require some form of advanced heart failure therapy such as an LVAD or cardiac transplant if he is a candidate. 2. 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  He seems to be in sinus rhythm now with no premature beats.  The patient will require an ICD placement before hospital discharge. 3. Influenza A: Continue treatment with Tamiflu. 4. Chronic kidney disease: Worsening renal function  likely due to low cardiac output. 5. Previous TIA: Continue treatment with Plavix.  I discussed the case with Dr. Haroldine Laws, Dr. Shawna Orleans, Dr. Posey Pronto and I also update the patient's sister   For questions or updates, please contact [facility] Please consult [email or web address] for contact info under Cardiology/STEMI.  Signed, Kathlyn Sacramento, MD  12/23/2017, 9:22 AM

## 2017-12-23 NOTE — Progress Notes (Signed)
Pt transferred to ccu, bedside report given.

## 2017-12-23 NOTE — Progress Notes (Signed)
Pt went into Vfib, became altered mentally, electrical shock delivered, given dose of amiodarone x1, transferred to ICU, intensivist notified, pt comfortable, no complaints

## 2017-12-23 NOTE — Progress Notes (Signed)
  Amiodarone Drug - Drug Interaction Consult Note  Recommendations: Continue to monitor vital signs. Currently no interacting medications but pharmacy will continue to monitor closely. For antibiotics at OSH, patient was on azithromycin- if antibiotics would be restarted, consider doxycyline for atypical coverage instead.  Amiodarone is metabolized by the cytochrome P450 system and therefore has the potential to cause many drug interactions. Amiodarone has an average plasma half-life of 50 days (range 20 to 100 days).   There is potential for drug interactions to occur several weeks or months after stopping treatment and the onset of drug interactions may be slow after initiating amiodarone.   []  Statins: Increased risk of myopathy. Simvastatin- restrict dose to 20mg  daily. Other statins: counsel patients to report any muscle pain or weakness immediately.  []  Anticoagulants: Amiodarone can increase anticoagulant effect. Consider warfarin dose reduction. Patients should be monitored closely and the dose of anticoagulant altered accordingly, remembering that amiodarone levels take several weeks to stabilize.  []  Antiepileptics: Amiodarone can increase plasma concentration of phenytoin, the dose should be reduced. Note that small changes in phenytoin dose can result in large changes in levels. Monitor patient and counsel on signs of toxicity.  []  Beta blockers: increased risk of bradycardia, AV block and myocardial depression. Sotalol - avoid concomitant use.  []   Calcium channel blockers (diltiazem and verapamil): increased risk of bradycardia, AV block and myocardial depression.  []   Cyclosporine: Amiodarone increases levels of cyclosporine. Reduced dose of cyclosporine is recommended.  []  Digoxin dose should be halved when amiodarone is started.  [x]  Diuretics: increased risk of cardiotoxicity if hypokalemia occurs.  []  Oral hypoglycemic agents (glyburide, glipizide, glimepiride): increased  risk of hypoglycemia. Patient's glucose levels should be monitored closely when initiating amiodarone therapy.   []  Drugs that prolong the QT interval:  Torsades de pointes risk may be increased with concurrent use - avoid if possible.  Monitor QTc, also keep magnesium/potassium WNL if concurrent therapy can't be avoided. Marland Kitchen Antibiotics: e.g. fluoroquinolones, erythromycin. . Antiarrhythmics: e.g. quinidine, procainamide, disopyramide, sotalol. . Antipsychotics: e.g. phenothiazines, haloperidol.  . Lithium, tricyclic antidepressants, and methadone.  Thank You, Doylene Canard, PharmD Clinical Pharmacist  Pager: [phone] Clinical Phone for 12/23/2017 until 3:30pm: [phone] If after 3:30pm, please call main pharmacy at [phone]

## 2017-12-23 NOTE — Progress Notes (Signed)
Dr Fletcher Anon and Dr Alva Garnet here. Pt drowsy with cheyne stokes resp noted. Maintains sat 95 or greater. He is easily arousable. Denies shob or chestpain. MAP greater than 65 with systolic in 23'T. Monitors SB 55- 64. 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 Frequent PAC's.  Amiodarone gtt at 3O mg/hr increased to 60mg  /hr per Dr Fletcher Anon. Defib pads on pt. Lasix given. Bibasilar Rales noted. O2 2L/[place]. Dr Fletcher Anon consult [facility] in [place] for transfer to [facility].

## 2017-12-23 NOTE — Consult Note (Signed)
[facility] Department of Emergency Medicine   Code Blue CONSULT NOTE  Chief Complaint: Cardiac arrest/unresponsive   Level V Caveat: Unresponsive  History of present illness: I was contacted by the hospital for a CODE BLUE cardiac arrest upstairs and presented to the patient's bedside.  Reportedly he has been having PVCs recently and non-specific arrhythmias.  Nurses responded when he went into v-fib on tele.  They found him unresponsive and pulseless, did "about 5 chest compressions", and got him on the Zoll.  They administered one defibrillation and the patient went back into a normal sinus rhythm and it was awake and was awake and alert when I arrived.   ROS: Unable to obtain, Level V caveat  Scheduled Meds: . amiodarone  150 mg Intravenous Once  . atorvastatin  40 mg Oral Daily  . carvedilol  3.125 mg Oral BID  . clopidogrel  75 mg Oral Daily  . furosemide  40 mg Intravenous BID  . heparin  5,000 Units Subcutaneous Q8H  . Influenza vac split quadrivalent PF  0.5 mL Intramuscular Tomorrow-1000  . ipratropium-albuterol  3 mL Nebulization QID  . ivabradine  5 mg Oral BID WC  . losartan  25 mg Oral Daily  . midodrine  10 mg Oral TID WC  . oseltamivir  75 mg Oral BID  . pantoprazole  40 mg Oral Daily  . pneumococcal 23 valent vaccine  0.5 mL Intramuscular Tomorrow-1000  . sodium chloride flush  3 mL Intravenous Q12H  . tiotropium  18 mcg Inhalation Daily   Continuous Infusions: . sodium chloride    . amiodarone     Followed by  . amiodarone    . azithromycin 500 mg (12/22/17 1713)  . cefTRIAXone (ROCEPHIN)  IV Stopped (12/22/17 1015)   PRN Meds:.sodium chloride, acetaminophen **OR** acetaminophen, albuterol, bisacodyl, cyclobenzaprine, diclofenac sodium, docusate sodium, fluticasone, guaiFENesin, nitroGLYCERIN, ondansetron **OR** ondansetron (ZOFRAN) IV, polyethylene glycol, sodium chloride flush, traMADol Past Medical History:  Diagnosis Date  . CHF  (congestive heart failure) (HCC)    nonischemic cardiomyopathy, EF 25%  . Hypertension   . TIA (transient ischemic attack) 06/21/16   Past Surgical History:  Procedure Laterality Date  . KNEE SURGERY Right   . LEFT HEART CATH AND CORONARY ANGIOGRAPHY N/A 09/09/2017   Procedure: LEFT HEART CATH AND CORONARY ANGIOGRAPHY;  Surgeon: Teodoro Spray, MD;  Location: [facility];  Service: Cardiovascular;  Laterality: N/A;  . VASECTOMY     Social History   Socioeconomic History  . Marital status: Single    Spouse name: Not on file  . Number of children: Not on file  . Years of education: Not on file  . Highest education level: Not on file  Social Needs  . Financial resource strain: Not on file  . Food insecurity - worry: Not on file  . Food insecurity - inability: Not on file  . Transportation needs - medical: Not on file  . Transportation needs - non-medical: Not on file  Occupational History  . Not on file  Tobacco Use  . Smoking status: Former Smoker    Packs/day: 0.25    Years: 20.00    Pack years: 5.00    Types: Cigarettes    Last attempt to quit: 02/20/2016    Years since quitting: 1.8  . Smokeless tobacco: Never Used  Substance and Sexual Activity  . Alcohol use: Yes    Alcohol/week: 0.0 oz    Comment: beer occasional  . Drug use: No  .  Sexual activity: Not on file  Other Topics Concern  . Not on file  Social History Narrative   Independent at baseline   Allergies  Allergen Reactions  . Lisinopril Cough    Last set of Vital Signs (not current) Vitals:   12/23/17 0018 12/23/17 0024  BP: (!) 174/83 93/61  Pulse: 62 71  Resp:    Temp:    SpO2: 100% 100%      Physical Exam  Gen: Awake, alert, no acute distress Cardiovascular: Strong peripheral pulses Resp: Regular, even breathing, good breath sounds throughout Abd: nondistended  Neuro: GCS 15, no evidence of focal neuro deficit Neck: No crepitus  Musculoskeletal: No deformity  Skin:  warm  CRITICAL CARE Performed by: Hinda Kehr Total critical care time: < 30 mins Critical care time was exclusive of separately billable procedures and treating other patients. Critical care was necessary to treat or prevent imminent or life-threatening deterioration. Critical care was time spent personally by me on the following activities: development of treatment plan with patient and/or surrogate as well as nursing, discussions with consultants, evaluation of patient's response to treatment, examination of patient, obtaining history from patient or surrogate, ordering and performing treatments and interventions, ordering and review of laboratory studies, ordering and review of radiographic studies, pulse oximetry and re-evaluation of patient's condition.    Assessment and Plan   The floor nurses appropriately treated his V. fib arrest with defibrillation and the patient is out of immediate danger at this time.  I ordered a liter of fluids and amiodarone 150 mg IV.  The ICU nurse practitioner showed up and we discussed the case and I recommended starting an amiodarone drip as the patient was having brief nonsustained runs of ventricular tachycardia of 2-3 beats and she was going to start the drip, move him to the ICU, and consult cardiology and the ICU MDs.  My assistance was no longer needed so I return to the emergency department and the patient was in no distress at that time.

## 2017-12-23 NOTE — Progress Notes (Addendum)
Pt having freq. PVCs, short runs of VT, 3-5 bts. Pt asymptomatic at this time. Paged Dr. Haroldine Laws. BMP from 1920 has resulted (K 4.0, Mg 2.3), reviewed results with MD, urine output is increasing slightly (135cc this past hour 1900-2000), CI/CO increased from earlier today (2.02/4.83 respectively).  Baseline rhythm NSR with 1st degree HB 60-70bpm. Per MD, keep amio gtt infusing at 30mg  and will continue to monitor closely.

## 2017-12-23 NOTE — Progress Notes (Addendum)
At 2144 pt experienced 30+ bt run VT. Pt A&O, instructed to cough which then brought him back to NSR 60s. Zoll pads applied and zoll at bedside. Dr. Haroldine Laws paged and made aware. Orders to wean norepi off if possible to keep SBP> 100; rec'vd orders for 150mg  amio bolus. Also notified MD that EKG showed slighly prolonged QT/QTc: 0.52/0.53, respectively. Pt remains A&O at this time, other VSS. Will proceed with interventions and continue to monitor closely.

## 2017-12-23 NOTE — Significant Event (Signed)
Centralized tele notified staff to check on patient immediately as he was having VFib. Nurses at bedside. Zoll pads placed on patient immediately. Zoll showing VFib. Unable to palpate pulse. Patient became unresponsive. CPR started immediately as Zoll was being charged. After charged to 150J patient defibrillated X1 with ROSC. Monitor showing SR with freg PVCs/Bigeminy. HR 50's -60's, BP 170/ . Patient began talking, A&O X 3. Code Blue team from ED, NP and ICU [place] responded. Patient no longer in acute distress. Transferred to ICU 12.

## 2017-12-23 NOTE — Procedures (Addendum)
Right Heart Catheter  Note Stephen [name] [id number] 12/12/1966   CVP 18 PA 64/33 PCWP 26 Thermo Co/CI  3.9/1.6  MV sat 45%  C/W cardiogenic shock and volume overload.   Glori Bickers, MD  4:03 PM

## 2017-12-23 NOTE — Progress Notes (Signed)
Centralized tele notified nsg staff check on Pt. Pt was found unresponsive, code Blue called, code team responded. See code blue sheet.

## 2017-12-24 ENCOUNTER — Other Ambulatory Visit: Payer: Self-pay

## 2017-12-24 ENCOUNTER — Encounter (HOSPITAL_COMMUNITY): Payer: Self-pay | Admitting: *Deleted

## 2017-12-24 DIAGNOSIS — J101 Influenza due to other identified influenza virus with other respiratory manifestations: Secondary | ICD-10-CM

## 2017-12-24 DIAGNOSIS — R570 Cardiogenic shock: Secondary | ICD-10-CM

## 2017-12-24 DIAGNOSIS — N179 Acute kidney failure, unspecified: Secondary | ICD-10-CM

## 2017-12-24 DIAGNOSIS — I493 Ventricular premature depolarization: Secondary | ICD-10-CM

## 2017-12-24 HISTORY — DX: Acute kidney failure, unspecified: N17.9

## 2017-12-24 HISTORY — DX: Influenza due to other identified influenza virus with other respiratory manifestations: J10.1

## 2017-12-24 LAB — BASIC METABOLIC PANEL
Anion gap: 11 (ref 5–15)
BUN: 50 mg/dL — ABNORMAL HIGH (ref 6–20)
CO2: 27 mmol/L (ref 22–32)
Calcium: 8.5 mg/dL — ABNORMAL LOW (ref 8.9–10.3)
Chloride: 92 mmol/L — ABNORMAL LOW (ref 101–111)
Creatinine, Ser: 2.25 mg/dL — ABNORMAL HIGH (ref 0.61–1.24)
GFR calc Af Amer: 37 mL/min — ABNORMAL LOW (ref >60)
GFR calc non Af Amer: 32 mL/min — ABNORMAL LOW (ref >60)
Glucose, Bld: 105 mg/dL — ABNORMAL HIGH (ref 65–99)
Potassium: 3.5 mmol/L (ref 3.5–5.1)
Sodium: 130 mmol/L — ABNORMAL LOW (ref 135–145)

## 2017-12-24 LAB — COOXEMETRY PANEL
Carboxyhemoglobin: 0.6 % (ref 0.5–1.5)
Methemoglobin: 1 % (ref 0.0–1.5)
O2 Saturation: 66.9 %
Total hemoglobin: 14.7 g/dL (ref 12.0–16.0)

## 2017-12-24 LAB — BRAIN NATRIURETIC PEPTIDE: B Natriuretic Peptide: 1335.4 pg/mL — ABNORMAL HIGH (ref 0.0–100.0)

## 2017-12-24 MED ORDER — POTASSIUM CHLORIDE CRYS ER 20 MEQ PO TBCR
40.0000 meq | EXTENDED_RELEASE_TABLET | Freq: Once | ORAL | Status: AC
  Administered 2017-12-24: 40 meq via ORAL
  Filled 2017-12-24: qty 2

## 2017-12-24 MED ORDER — OSELTAMIVIR PHOSPHATE 30 MG PO CAPS
30.0000 mg | ORAL_CAPSULE | Freq: Two times a day (BID) | ORAL | Status: AC
  Administered 2017-12-24 – 2017-12-25 (×2): 30 mg via ORAL
  Filled 2017-12-24 (×2): qty 1

## 2017-12-24 MED ORDER — SPIRONOLACTONE 12.5 MG HALF TABLET
12.5000 mg | ORAL_TABLET | Freq: Every day | ORAL | Status: DC
  Administered 2017-12-24: 12.5 mg via ORAL
  Filled 2017-12-24 (×2): qty 1

## 2017-12-24 MED ORDER — POTASSIUM CHLORIDE CRYS ER 20 MEQ PO TBCR
20.0000 meq | EXTENDED_RELEASE_TABLET | Freq: Once | ORAL | Status: AC
Start: 2017-12-24 — End: 2017-12-24
  Administered 2017-12-24: 20 meq via ORAL
  Filled 2017-12-24: qty 1

## 2017-12-24 MED ORDER — POTASSIUM CHLORIDE CRYS ER 20 MEQ PO TBCR
20.0000 meq | EXTENDED_RELEASE_TABLET | Freq: Two times a day (BID) | ORAL | Status: DC
  Administered 2017-12-24 (×2): 20 meq via ORAL
  Filled 2017-12-24 (×2): qty 1

## 2017-12-24 NOTE — Progress Notes (Signed)
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 Started on NE and milrinone. Output improved but developed multiple episodes of VT. NE weaned down now at 1..  Diuresed well overnight. Breathing better. Creatinine starting to improve  Swan numbers reviewed personally with RN at bedside  CVP 12 PA 47/25 (34) PCWP 28 Thermo 6.5/2.7 PVR 1.0 WU SVR 749 Co-ox 67% on milrinone 0.25 & NE 1  Objective:   Weight Range:  Vital Signs:   Temp:  [97.4 F (36.3 C)-100.2 F (37.9 C)] 100.2 F (37.9 C) (12/25 0515) Pulse Rate:  [26-135] 46 (12/25 0515) Resp:  [9-34] 21 (12/25 0515) BP: (58-145)/(30-100) 103/79 (12/25 0515) SpO2:  [73 %-100 %] 95 % (12/25 0515) Weight:  [113.4 kg (250 lb 0 oz)-115.8 kg (255 lb 4.7 oz)] 115.8 kg (255 lb 4.7 oz) (12/25 0415) Last BM Date: 12/20/17  Weight change: Filed Weights   12/23/17 1130 12/24/17 0415  Weight: 113.4 kg (250 lb 0 oz) 115.8 kg (255 lb 4.7 oz)    Intake/Output:   Intake/Output Summary (Last 24 hours) at 12/24/2017 0549 Last data filed at 12/24/2017 0521 Gross per 24 hour  Intake 1438.11 ml  Output 1775 ml  Net -336.89 ml     Physical Exam: General:  Lying in bed. No resp difficulty HEENT: normal Neck: supple. RIJ swan. JVP to ear . Carotids 2+ bilat; no bruits. No lymphadenopathy or thryomegaly appreciated. Cor: PMI laterally displaced. Irregular rate & rhythm. +s3 Lungs: basilar crackles Abdomen: soft, nontender, + distended. No hepatosplenomegaly. No bruits or masses. Good bowel sounds. Extremities: no cyanosis, clubbing, rash, trace edema Neuro: alert & orientedx3, cranial nerves grossly intact. moves all 4 extremities w/o difficulty. Affect pleasant  Telemetry: NSR multiple runs VT overnight + Frequent PVCs Personally reviewed   Labs: Basic Metabolic Panel: Recent Labs  Lab 12/22/17 0339  12/23/17 0034 12/23/17 0706 12/23/17 1919 12/24/17 0303  NA 134* 130* 127* 129* 130*  K 3.9 3.4* 4.8 4.0 3.5  CL 99* 92* 94* 92* 92*  CO2 25 26 22 25 27   GLUCOSE 124* 125* 145* 143* 105*  BUN 38* 44* 47* 51* 50*  CREATININE 1.63* 1.86* 2.22* 2.53* 2.25*  CALCIUM 8.7* 8.5* 8.6* 8.5* 8.5*  MG  --  2.0 2.3  --   --   PHOS  --  4.2 4.7*  --   --     Liver Function Tests: Recent Labs  Lab 12/20/17 1438 12/23/17 0034  AST 40 84*  ALT 20 56  ALKPHOS 55 50  BILITOT 1.9* 0.8  PROT 7.1 6.5  ALBUMIN 4.1 3.3*   No results for input(s): LIPASE, AMYLASE in the last 168 hours. No results for input(s): AMMONIA in the last 168 hours.  CBC: Recent Labs  Lab 12/20/17 1438 12/21/17 0347 12/23/17 0034 12/23/17 1919  WBC 7.9 6.1 5.7 6.2  NEUTROABS 6.9*  --   --   --   HGB 15.3 14.1 14.6 14.3  HCT 46.1 43.2 45.1 42.8  MCV 89.9 91.0 90.9 88.6  PLT 142* 120* 118* 131*    Cardiac Enzymes: Recent Labs  Lab 12/23/17 0034 12/23/17 0706  TROPONINI 0.09* 0.15*    BNP: BNP (last 3 results) Recent Labs    10/08/17 1710 12/23/17 0034 12/24/17 0303  BNP 1,259.0* 1,017.0* 1,335.4*    ProBNP (last 3 results) No results for  input(s): PROBNP in the last 8760 hours.    Other results:  Imaging: Dg Chest Port 1 View  Result Date: 12/23/2017 CLINICAL DATA:  Central venous line placement. EXAM: PORTABLE CHEST 1 VIEW COMPARISON:  12/23/2017 FINDINGS: Right IJ Swan-Ganz catheter is present with tip fairly peripheral in the right descending pulmonary artery. No appreciable pneumothorax. Moderate enlargement of the cardiopericardial silhouette with indistinct pulmonary vasculature and some faint perihilar and basilar airspace opacities. IMPRESSION: 1. Right IJ Swan-Ganz catheter tip: Peripheral in the right descending pulmonary artery. 2. Moderate enlargement of the cardiopericardial silhouette with suspected mild to moderate pulmonary edema. Correlate with pulmonary dural pressure  measurements. Electronically Signed   By: Van Clines M.D.   On: 12/23/2017 14:41   Dg Chest Port 1 View  Result Date: 12/23/2017 CLINICAL DATA:  51 year old male status post cardiac arrest. EXAM: PORTABLE CHEST 1 VIEW COMPARISON:  Chest radiograph dated 12/20/2017 FINDINGS: There is moderate cardiomegaly with findings of vascular congestion and pulmonary edema as seen previously. Overall no significant interval change compared to the earlier radiograph. For no pneumothorax. No acute osseous pathology. IMPRESSION: Cardiomegaly with findings of CHF and pulmonary edema. Overall no significant interval change. Electronically Signed   By: Anner Crete M.D.   On: 12/23/2017 01:11      Medications:     Scheduled Medications: . aspirin  81 mg Oral Pre-Cath  . aspirin EC  81 mg Oral Daily  . Chlorhexidine Gluconate Cloth  6 each Topical Daily  . furosemide  80 mg Intravenous BID  . heparin  5,000 Units Subcutaneous Q8H  . mouth rinse  15 mL Mouth Rinse BID  . oseltamivir  75 mg Oral BID  . sodium chloride flush  10-40 mL Intracatheter Q12H  . sodium chloride flush  3 mL Intravenous Q12H     Infusions: . sodium chloride 250 mL (12/23/17 1900)  . sodium chloride 10 mL/hr at 12/23/17 1900  . amiodarone 30 mg/hr (12/24/17 0253)  . milrinone 0.25 mcg/kg/min (12/23/17 2347)  . norepinephrine (LEVOPHED) Adult infusion 1 mcg/min (12/24/17 0330)  . piperacillin-tazobactam (ZOSYN)  IV 3.375 g (12/24/17 0521)     PRN Medications:  sodium chloride, acetaminophen, nitroGLYCERIN, ondansetron (ZOFRAN) IV, sodium chloride flush, sodium chloride flush   Assessment:     Plan/Discussion:    1. Acute on chronic systolic HF with cardiogenic shock - due to NiCM. EF 20% -cath 9/18 with non-obstructive CAD - now on milrinone 0.25. Norepi weaned - co-ox 67% - swan numbers improving - off b-blocker and ARB with shock and AKI - as creatinine improves will need consideration of advanced  therapies - start spiro   2. VF arrest on 12/24 at [facility] - s/p defibrillation x 1 - multiple runs VT overnight. Improved with decreasing norepi and starting IV amio - continue IV amio - keep K > 4.0 Mg > 2.0 - if doesn't get advanced therapies this admit will need ICD (has seen [name] recently)  3. Acute on chronic renal failure stage III - baseline 1.5. Peaked at 2.5 - now improving on inotropic support  4. Acute hypoxic respiratory failure - due primarily to CHF/pulmonary edema but also possible PNA in setting of flu - PCT elevated at 1.1 - Continue diuresis & abx  5. Infleunzae A - on Tamiflu and respiratory precautions  6. Hypokalemia/hyponatremia - will supp K - free water restrict  7. Frequent PVCs - continue amio. Unclear if this is related to NICM or not. Would like to see if  EF recovers some with suppression of PVCs prior to going to advanced therapies, if possible   CRITICAL CARE Performed by: Glori Bickers  Total critical care time: 35 minutes  Critical care time was exclusive of separately billable procedures and treating other patients.  Critical care was necessary to treat or prevent imminent or life-threatening deterioration.  Critical care was time spent personally by me (independent of midlevel providers or residents) on the following activities: development of treatment plan with patient and/or surrogate as well as nursing, discussions with consultants, evaluation of patient's response to treatment, examination of patient, obtaining history from patient or surrogate, ordering and performing treatments and interventions, ordering and review of laboratory studies, ordering and review of radiographic studies, pulse oximetry and re-evaluation of patient's condition.       Length of Stay: 1   Glori Bickers 12/24/2017, 5:49 AM  Advanced Heart Failure Team Pager [phone] (M-F; 7a - 4p)  Please contact [facility] for night-coverage after  hours (4p -7a ) and weekends on [email or web address]

## 2017-12-24 NOTE — Progress Notes (Signed)
Paged on call Cards Bhagat, PA for BMET and Mag labs as patient having more frequent ectopy and has not been on levophed at all this shift. Another 80 mg of lasix due at 18:00. PA verbal order to go ahead and give the lasix and increase his night time potassium to 40 meq. Will administer and continue to monitor closely.  Lucius Conn, RN

## 2017-12-25 DIAGNOSIS — R570 Cardiogenic shock: Principal | ICD-10-CM

## 2017-12-25 DIAGNOSIS — J101 Influenza due to other identified influenza virus with other respiratory manifestations: Secondary | ICD-10-CM

## 2017-12-25 DIAGNOSIS — I4901 Ventricular fibrillation: Secondary | ICD-10-CM

## 2017-12-25 LAB — BASIC METABOLIC PANEL
ANION GAP: 10 (ref 5–15)
BUN: 44 mg/dL — AB (ref 6–20)
CALCIUM: 8 mg/dL — AB (ref 8.9–10.3)
CO2: 30 mmol/L (ref 22–32)
CREATININE: 1.67 mg/dL — AB (ref 0.61–1.24)
Chloride: 95 mmol/L — ABNORMAL LOW (ref 101–111)
GFR calc Af Amer: 53 mL/min — ABNORMAL LOW (ref >60)
GFR, EST NON AFRICAN AMERICAN: 46 mL/min — AB (ref >60)
GLUCOSE: 100 mg/dL — AB (ref 65–99)
POTASSIUM: 3.2 mmol/L — AB (ref 3.5–5.1)
Sodium: 135 mmol/L (ref 135–145)

## 2017-12-25 LAB — CULTURE, BLOOD (ROUTINE X 2)
CULTURE: NO GROWTH
Culture: NO GROWTH
SPECIMEN DESCRIPTION: ADEQUATE
Specimen Description: ADEQUATE

## 2017-12-25 LAB — COOXEMETRY PANEL
Carboxyhemoglobin: 1.5 % (ref 0.5–1.5)
Methemoglobin: 0.7 % (ref 0.0–1.5)
O2 SAT: 70.3 %
TOTAL HEMOGLOBIN: 15 g/dL (ref 12.0–16.0)

## 2017-12-25 LAB — MAGNESIUM: MAGNESIUM: 1.8 mg/dL (ref 1.7–2.4)

## 2017-12-25 MED ORDER — FUROSEMIDE 40 MG PO TABS
40.0000 mg | ORAL_TABLET | Freq: Two times a day (BID) | ORAL | Status: DC
  Administered 2017-12-25 – 2017-12-26 (×4): 40 mg via ORAL
  Filled 2017-12-25 (×4): qty 1

## 2017-12-25 MED ORDER — POTASSIUM CHLORIDE CRYS ER 20 MEQ PO TBCR
40.0000 meq | EXTENDED_RELEASE_TABLET | Freq: Two times a day (BID) | ORAL | Status: DC
  Administered 2017-12-25 (×2): 40 meq via ORAL
  Filled 2017-12-25 (×2): qty 2

## 2017-12-25 MED ORDER — SALINE SPRAY 0.65 % NA SOLN
1.0000 | NASAL | Status: DC | PRN
  Administered 2017-12-25: 1 via NASAL
  Filled 2017-12-25: qty 44

## 2017-12-25 MED ORDER — POTASSIUM CHLORIDE CRYS ER 20 MEQ PO TBCR
40.0000 meq | EXTENDED_RELEASE_TABLET | Freq: Once | ORAL | Status: AC
  Administered 2017-12-25: 40 meq via ORAL
  Filled 2017-12-25: qty 2

## 2017-12-25 MED ORDER — BENZONATATE 100 MG PO CAPS
100.0000 mg | ORAL_CAPSULE | Freq: Three times a day (TID) | ORAL | Status: DC | PRN
  Administered 2017-12-27 – 2017-12-29 (×3): 100 mg via ORAL
  Filled 2017-12-25 (×3): qty 1

## 2017-12-25 MED ORDER — SPIRONOLACTONE 25 MG PO TABS
25.0000 mg | ORAL_TABLET | Freq: Every day | ORAL | Status: DC
  Administered 2017-12-25 – 2017-12-31 (×7): 25 mg via ORAL
  Filled 2017-12-25 (×7): qty 1

## 2017-12-25 NOTE — Progress Notes (Signed)
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 Started on NE and milrinone. Output improved but developed multiple episodes of VT. NE weaned.  No further VT. Still with frequent PVCs.  Feeling OK this am. Denies SOB or palpitations. Awake and alert. Didn't sleep very well. Nose congested.   Creatinine continues to improve. Cr 2.25 -> 1.67 this am. K 3.2  Negative 6.2 L. Weight shows down 13 lbs, which could be accurate given output. CVP 7  CVP 7 with large respiratory variability Swan numbers as of 0000 PA 45/20 (21) PCWP 17 Thermo 5.9/2.5 PVR 189 SVR 879 Co-ox 70.3% on Milrinone 0.25 mcg/kg/min.     Objective:   Weight Range:  Vital Signs:   Temp:  [98.4 F (36.9 C)-100.6 F (38.1 C)] 99 F (37.2 C) (12/26 0700) Pulse Rate:  [25-69] 61 (12/26 0700) Resp:  [12-37] 20 (12/26 0700) BP: (87-131)/(52-113) 131/83 (12/26 0700) SpO2:  [80 %-100 %] 94 % (12/26 0700) Weight:  [241 lb 10 oz (109.6 kg)] 241 lb 10 oz (109.6 kg) (12/26 0500) Last BM Date: 12/20/17  Weight change: Filed Weights   12/23/17 1130 12/24/17 0415 12/25/17 0500  Weight: 250 lb 0 oz (113.4 kg) 255 lb 4.7 oz (115.8 kg) 241 lb 10 oz (109.6 kg)    Intake/Output:   Intake/Output Summary (Last 24 hours) at 12/25/2017 0714 Last data filed at 12/25/2017 0700 Gross per 24 hour  Intake 1793.3 ml  Output 8075 ml  Net -6281.7 ml     Physical Exam: General:Lying in bed. No resp difficulty.  HEENT: Normal Neck: Supple. RIJ swan. JVP 7-8 Carotids 2+ bilat; no bruits. No thyromegaly or nodule noted. Cor: PMI nondisplaced. Regular with frequent ectopy, +S3.  Lungs: Bibasilar crackles. Abdomen: Soft, non-tender, +distended, no HSM. No bruits or masses. +BS  Extremities: No cyanosis, clubbing, or rash. R and LLE no edema.  Neuro: Alert & orientedx3, cranial nerves grossly intact.  moves all 4 extremities w/o difficulty. Affect pleasant   Telemetry: NSR with frequent PVCs, No further VT , Personally reviewed   Labs: Basic Metabolic Panel: Recent Labs  Lab 12/23/17 0034 12/23/17 0706 12/23/17 1919 12/24/17 0303 12/25/17 0338  NA 130* 127* 129* 130* 135  K 3.4* 4.8 4.0 3.5 3.2*  CL 92* 94* 92* 92* 95*  CO2 26 22 25 27 30   GLUCOSE 125* 145* 143* 105* 100*  BUN 44* 47* 51* 50* 44*  CREATININE 1.86* 2.22* 2.53* 2.25* 1.67*  CALCIUM 8.5* 8.6* 8.5* 8.5* 8.0*  MG 2.0 2.3  --   --   --   PHOS 4.2 4.7*  --   --   --     Liver Function Tests: Recent Labs  Lab 12/20/17 1438 12/23/17 0034  AST 40 84*  ALT 20 56  ALKPHOS 55 50  BILITOT 1.9* 0.8  PROT 7.1 6.5  ALBUMIN 4.1 3.3*   No results for input(s): LIPASE, AMYLASE in the last 168 hours. No results for input(s): AMMONIA in the last 168 hours.  CBC: Recent Labs  Lab 12/20/17 1438 12/21/17 0347 12/23/17 0034 12/23/17 1919  WBC 7.9 6.1 5.7 6.2  NEUTROABS 6.9*  --   --   --   HGB 15.3 14.1 14.6 14.3  HCT 46.1 43.2 45.1 42.8  MCV 89.9 91.0 90.9 88.6  PLT 142* 120* 118* 131*    Cardiac Enzymes:  Recent Labs  Lab 12/23/17 0034 12/23/17 0706  TROPONINI 0.09* 0.15*    BNP: BNP (last 3 results) Recent Labs    10/08/17 1710 12/23/17 0034 12/24/17 0303  BNP 1,259.0* 1,017.0* 1,335.4*   ProBNP (last 3 results) No results for input(s): PROBNP in the last 8760 hours.  Other results:  Imaging: Dg Chest Port 1 View  Result Date: 12/23/2017 CLINICAL DATA:  Central venous line placement. EXAM: PORTABLE CHEST 1 VIEW COMPARISON:  12/23/2017 FINDINGS: Right IJ Swan-Ganz catheter is present with tip fairly peripheral in the right descending pulmonary artery. No appreciable pneumothorax. Moderate enlargement of the cardiopericardial silhouette with indistinct pulmonary vasculature and some faint perihilar and basilar airspace opacities. IMPRESSION: 1. Right IJ Swan-Ganz catheter tip: Peripheral in  the right descending pulmonary artery. 2. Moderate enlargement of the cardiopericardial silhouette with suspected mild to moderate pulmonary edema. Correlate with pulmonary dural pressure measurements. Electronically Signed   By: Van Clines M.D.   On: 12/23/2017 14:41    Medications:    Scheduled Medications: . aspirin EC  81 mg Oral Daily  . Chlorhexidine Gluconate Cloth  6 each Topical Daily  . furosemide  80 mg Intravenous BID  . heparin  5,000 Units Subcutaneous Q8H  . mouth rinse  15 mL Mouth Rinse BID  . oseltamivir  30 mg Oral BID  . potassium chloride  20 mEq Oral BID  . sodium chloride flush  10-40 mL Intracatheter Q12H  . sodium chloride flush  3 mL Intravenous Q12H  . spironolactone  12.5 mg Oral Daily    Infusions: . sodium chloride 250 mL (12/23/17 1900)  . sodium chloride 10 mL/hr at 12/23/17 1900  . amiodarone 30 mg/hr (12/25/17 0426)  . milrinone 0.25 mcg/kg/min (12/25/17 0124)  . norepinephrine (LEVOPHED) Adult infusion Stopped (12/24/17 6578)  . piperacillin-tazobactam (ZOSYN)  IV 3.375 g (12/25/17 0554)    PRN Medications: sodium chloride, acetaminophen, nitroGLYCERIN, ondansetron (ZOFRAN) IV, sodium chloride flush, sodium chloride flush  Assessment:   [name] is a 51 y/o male with longstanding NICM and systolic HF transferred from [name] for management of worsening HF, Shock, Influenzae A and VF arrest.  Plan/Discussion:    1. Acute on chronic systolic HF with cardiogenic shock - Due to NiCM. EF 20% - Cath 9/18 with non-obstructive CAD - Co-ox 70.3% on milrinone 0.25. Norepi weaned. Wean milrinone to 0.125 mcg/kg/min.  - Volume status improved. Stop IV lasix. Will try on lasix 40 mg BID.  [name] numbers improving.  - Off b-blocker and ARB with shock and AKI - Creatinine continues to improve.  - Increase spiro to 25 mg daily.   2. VF arrest on 12/24 at [name] - S/p defibrillation x 1 - Multiple runs VT overnight into 12/24/17. Improved  with decreasing norepi and starting IV amio - Continue IV amio - Keep K > 4.0 Mg > 2.0. Supp K and check Mg.  - if doesn't get advanced therapies this admit will need ICD (has seen [name] recently)  3. Acute on chronic renal failure stage III - baseline 1.5. Peaked at 2.5  - now improving on inotropic support. Nearly back to baseline.   4. Acute hypoxic respiratory failure - due primarily to CHF/pulmonary edema but also possible PNA in setting of flu - PCT elevated at 1.1 - Continue diuresis & abx  5. Infleunzae A - On Tamiflu and respiratory precautions.  - Tmax 100.6. Continue symptomatic treatment.   6. Hypokalemia/hyponatremia - will supp K this am. Recheck  Mg.  - Na improved. Continue free water restrict  7. Frequent PVCs - Continue amiodarone. amio. Unclear if this is related to NICM or not. Would like to see if EF recovers some with suppression of PVCs prior to going to advanced therapies, if possible. - Continues to have upwards of 20 PVCs a min overnight.   Length of Stay: 2  Annamaria Helling  12/25/2017, 7:14 AM  Advanced Heart Failure Team Pager [phone] (M-F; 7a - 4p)  Please contact [facility] for night-coverage after hours (4p -7a ) and weekends on [email or web address]  Agree with above.  Remains tenuous. On milrinone. Norepi weaned off. Feels like breathing better but still a bit labored on exam Swan numbers reviewed personally with RN. Continues with low-grade temps. On zosyn. Also still with frequent PVCs despite amio   On exam  Sitting up in bed. Increase WOB RIJ swan Cor RRR with ectopy +s3 Lungs: mild basilar crackles Ab: soft NT/ND Ext warm. No edema  Will begin milrinone wean. Can remove swan but leave introducer. No PICC as he will need ICD. Continue zosyn for now.   Discussed with Ep who will evaluate for ICD and also look at PVC burden. He is close to needing advanced therapies but given that cardiomyopathy has only been present for  about 18 months I would like to see if he can recover LV function with PVC suppression first.   CRITICAL CARE Performed by: Glori Bickers  Total critical care time: 45 minutes  Critical care time was exclusive of separately billable procedures and treating other patients.  Critical care was necessary to treat or prevent imminent or life-threatening deterioration.  Critical care was time spent personally by me (independent of midlevel providers or residents) on the following activities: development of treatment plan with patient and/or surrogate as well as nursing, discussions with consultants, evaluation of patient's response to treatment, examination of patient, obtaining history from patient or surrogate, ordering and performing treatments and interventions, ordering and review of laboratory studies, ordering and review of radiographic studies, pulse oximetry and re-evaluation of patient's condition.  Glori Bickers, MD  9:38 AM

## 2017-12-25 NOTE — Progress Notes (Signed)
CARDIAC REHAB PHASE I   PRE:  Rate/Rhythm: 69 SR with PVCs    BP: sitting 78/61, 98/77    SaO2: 95 6L  MODE:  Ambulation: stood, two steps to recliner   POST:  Rate/Rhythm: 78 SR with PVCs    BP: sitting 101/71     SaO2: 95 6L  Pt very eager to get out of bed. Initially SBP registering in 70s and 60s however pt asx. This could be his PVCs making it difficult. BP eventually 98/77. Decided to move to recliner instead of walking. Pt mostly jumped out of bed. Took appropriate steps to recliner. Felt better, more comfortable in recliner with VSS. Will f/u tomorrow to ambulate. Might need to manually take BP.  [place], ACSM 12/25/2017 3:36 PM

## 2017-12-25 NOTE — Care Management Note (Signed)
Case Management Note Patient Details  Name: HAWK MONES MRN: [id number] Date of Birth: 1966/02/16  Subjective/Objective:  Transfer from [facility], presents with cardiogenic shock, flu, and VF arrest on 12/24, febrile,                   Action/Plan: NCM will cont to follow for dc needs.   Expected Discharge Date:                  Expected Discharge Plan:     In-House Referral:     Discharge planning Services  CM Consult  Post Acute Care Choice:    Choice offered to:     DME Arranged:    DME Agency:     HH Arranged:    HH Agency:     Status of Service:  In process, will continue to follow  If discussed at Long Length of Stay Meetings, dates discussed:    Additional Comments:  Zenon Mayo, RN 12/25/2017, 7:56 PM

## 2017-12-25 NOTE — Consult Note (Signed)
ELECTROPHYSIOLOGY CONSULT NOTE    Patient ID: Stephen [name] MRN: [id number], DOB/AGE: Jul 12, 1966 51 y.o.  Admit date: 12/23/2017 Date of Consult: 12/25/2017  Primary Physician: [name], MD Primary Cardiologist: Fletcher Anon Electrophysiologist: Caryl Comes  Patient Profile: Stephen [name] is a 51 y.o. male with a history of NICM, congestive heart failure, and PVC's who is being seen today for the evaluation of cardiac arrest at the request of Dr Haroldine Laws.  HPI:  Stephen [name] is a 51 y.o. male who was recently seen by Dr Caryl Comes in the outpatient setting for evaluation of cardiomyopathy.  There was concern that PVC's were contributing and he was started on amiodarone for the short term to see if EF would improve with decreased burden. He was admitted to [facility] 12/24 with cardiogenic shock in the setting of the flu.  He had VF arrest at [facility] and was transferred to [facility] for further evaluation.  He was in cardiogenic shock and has been treated with pressors with significant improvement. PVC burden remains high on amiodarone drip.  EP has been asked to evaluate for ICD placement.  AHF team thinks that he will require advanced therapies at some point, but would like to see if EF recovers with PVC suppression first.   Cath 08/2017 demonstrated normal coronaries. Echo 10/18 demonstrated EF 20-25%, diffuse hypokinesis, LA moderately dilated, mild to moderate MR.  He denies chest pain, palpitations, PND, orthopnea, nausea, vomiting, syncope, edema, weight gain, or early satiety.  Past Medical History:  Diagnosis Date  . Arrhythmia   . Cardiac arrest ([place]) 12/23/2017   Brief V-fib arrest  . CHF (congestive heart failure) (HCC)    nonischemic cardiomyopathy, EF 25%  . Hypertension   . NICM (nonischemic cardiomyopathy) ([place])   . TIA (transient ischemic attack) 06/21/16     Surgical History:  Past Surgical History:  Procedure Laterality Date  . KNEE SURGERY Right   . LEFT HEART CATH AND  CORONARY ANGIOGRAPHY N/A 09/09/2017   Procedure: LEFT HEART CATH AND CORONARY ANGIOGRAPHY;  Surgeon: Teodoro Spray, MD;  Location: [place] [facility];  Service: Cardiovascular;  Laterality: N/A;  . VASECTOMY       Medications Prior to Admission  Medication Sig Dispense Refill Last Dose  . acetaminophen (TYLENOL) 500 MG tablet Take 1 capsule by mouth every 4 (four) hours as needed for fever.   prn at prn  . albuterol (PROVENTIL HFA;VENTOLIN HFA) 108 (90 Base) MCG/ACT inhaler Inhale 2 puffs into the lungs every 4 (four) hours as needed for wheezing or shortness of breath. 1 Inhaler 0 prn at prn  . amiodarone (NEXTERONE PREMIX) 360-4.14 MG/200ML-% SOLN Inject 33.33 mL/hr into the vein continuous.     Marland Kitchen atorvastatin (LIPITOR) 40 MG tablet Take 1 tablet (40 mg total) by mouth daily at 6 PM. (Patient taking differently: Take 40 mg by mouth daily. ) 30 tablet 2 12/19/2017 at Unknown time  . Camphor-Eucalyptus-Menthol (CHEST RUB) 4.8-1.2-2.6 % OINT Apply 1 application topically daily as needed (congestino).    prn at prn  . carvedilol (COREG) 6.25 MG tablet Take 1 tablet (6.25 mg total) 2 (two) times daily by mouth. 180 tablet 3 12/19/2017 at Unknown time  . clopidogrel (PLAVIX) 75 MG tablet Take 1 tablet (75 mg total) by mouth daily. 30 tablet 2 12/19/2017 at Unknown time  . cyclobenzaprine (FLEXERIL) 10 MG tablet Take 1 tablet (10 mg total) by mouth 3 (three) times daily as needed for muscle spasms. 20 tablet 0 prn at prn  .  diclofenac sodium (VOLTAREN) 1 % GEL Apply 2 g topically 4 (four) times daily as needed (pain).    prn at prn  . docusate sodium (COLACE) 100 MG capsule Take 1 capsule (100 mg total) by mouth 2 (two) times daily as needed for mild constipation. 10 capsule 0 prn at prn  . fluticasone (FLONASE) 50 MCG/ACT nasal spray Place 2 sprays into both nostrils daily. (Patient taking differently: Place 2 sprays into both nostrils daily as needed for allergies. ) 16 g 2 prn at prn  .  Fluticasone-Salmeterol (ADVAIR DISKUS) 250-50 MCG/DOSE AEPB Inhale 1 puff into the lungs 2 (two) times daily. (Patient not taking: Reported on 12/20/2017) 1 each 0 Not Taking at Unknown time  . ivabradine (CORLANOR) 5 MG TABS tablet Take 1 tablet (5 mg total) by mouth 2 (two) times daily with a meal. 60 tablet 3 12/19/2017 at Unknown time  . nitroGLYCERIN (NITROSTAT) 0.4 MG SL tablet Place 1 tablet (0.4 mg total) under the tongue every 5 (five) minutes as needed for chest pain. 30 tablet 0 prn at prn  . pantoprazole (PROTONIX) 40 MG tablet Take 1 tablet (40 mg total) by mouth daily. 30 tablet 0 12/19/2017 at Unknown time  . phenylephrine 10 mg in sodium chloride 0.9 % 250 mL Inject 0-400 mcg/min into the vein continuous.     . polyethylene glycol (MIRALAX / GLYCOLAX) packet Take 17 g by mouth daily. (Patient taking differently: Take 17 g by mouth daily as needed for mild constipation. ) 14 each 0 prn at prn  . tiotropium (SPIRIVA HANDIHALER) 18 MCG inhalation capsule Place 18 mcg daily into inhaler and inhale.    prn at prn    Inpatient Medications:  . aspirin EC  81 mg Oral Daily  . Chlorhexidine Gluconate Cloth  6 each Topical Daily  . furosemide  40 mg Oral BID  . heparin  5,000 Units Subcutaneous Q8H  . mouth rinse  15 mL Mouth Rinse BID  . oseltamivir  30 mg Oral BID  . potassium chloride  40 mEq Oral BID  . sodium chloride flush  10-40 mL Intracatheter Q12H  . sodium chloride flush  3 mL Intravenous Q12H  . spironolactone  25 mg Oral Daily    Allergies:  Allergies  Allergen Reactions  . Lisinopril Cough    Social History   Socioeconomic History  . Marital status: Single    Spouse name: Not on file  . Number of children: Not on file  . Years of education: Not on file  . Highest education level: Not on file  Social Needs  . Financial resource strain: Not on file  . Food insecurity - worry: Not on file  . Food insecurity - inability: Not on file  . Transportation needs -  medical: Not on file  . Transportation needs - non-medical: Not on file  Occupational History  . Not on file  Tobacco Use  . Smoking status: Former Smoker    Packs/day: 0.25    Years: 20.00    Pack years: 5.00    Types: Cigarettes    Last attempt to quit: 02/20/2016    Years since quitting: 1.8  . Smokeless tobacco: Never Used  Substance and Sexual Activity  . Alcohol use: Yes    Alcohol/week: 0.0 oz    Comment: beer occasional  . Drug use: No  . Sexual activity: Not on file  Other Topics Concern  . Not on file  Social History Narrative   Independent at  baseline     Family History  Problem Relation Age of Onset  . Hypertension Mother   . Heart failure Mother   . Hypertension Father   . CAD Father   . Heart attack Father      Review of Systems: All other systems reviewed and are otherwise negative except as noted above.  Physical Exam: Vitals:   12/25/17 0600 12/25/17 0630 12/25/17 0700 12/25/17 0746  BP: 105/74 114/75 131/83   Pulse: (!) 42 (!) 49 61   Resp: 20 20 20    Temp: 98.8 F (37.1 C) 99.1 F (37.3 C) 99 F (37.2 C)   TempSrc:      SpO2: 96% 99% 94% 100%  Weight:      Height:        GEN- The patient is ill appearing, alert and oriented x 3 today.   HEENT: normocephalic, atraumatic; sclera clear, conjunctiva pink; hearing intact; oropharynx clear; neck supple Lungs- Clear to ausculation bilaterally, normal work of breathing.  No wheezes, rales, rhonchi Heart- Regular rate and rhythm  GI- soft, non-tender, non-distended, bowel sounds present Extremities- no clubbing, cyanosis, or edema  MS- no significant deformity or atrophy Skin- warm and dry, no rash or lesion Psych- euthymic mood, full affect Neuro- strength and sensation are intact  Labs:   Lab Results  Component Value Date   WBC 6.2 12/23/2017   HGB 14.3 12/23/2017   HCT 42.8 12/23/2017   MCV 88.6 12/23/2017   PLT 131 (L) 12/23/2017    Recent Labs  Lab 12/23/17 0034  12/25/17 0338   NA 130*   < > 135  K 3.4*   < > 3.2*  CL 92*   < > 95*  CO2 26   < > 30  BUN 44*   < > 44*  CREATININE 1.86*   < > 1.67*  CALCIUM 8.5*   < > 8.0*  PROT 6.5  --   --   BILITOT 0.8  --   --   ALKPHOS 50  --   --   ALT 56  --   --   AST 84*  --   --   GLUCOSE 125*   < > 100*   < > = values in this interval not displayed.      Radiology/Studies: Dg Chest 2 View  Result Date: 12/20/2017 CLINICAL DATA:  Cough.  Body aches.  Fevers. EXAM: CHEST  2 VIEW COMPARISON:  CT 09/28/2017.  Chest x-ray 920 08/07/2017. FINDINGS: Severe cardiomegaly with pulmonary vascular prominence and bilateral interstitial prominence and bibasilar infiltrates. These findings suggest congestive heart failure with bilateral pulmonary edema. Bilateral pneumonia cannot be excluded . IMPRESSION: Severe cardiomegaly. Pulmonary venous congestion with bilateral interstitial prominence and bibasilar alveolar infiltrates. These findings suggest congestive heart failure with pulmonary edema. Bilateral pneumonia cannot be excluded. Electronically Signed   By: Marcello Moores  Register   On: 12/20/2017 15:13   Dg Chest Port 1 View  Result Date: 12/23/2017 CLINICAL DATA:  Central venous line placement. EXAM: PORTABLE CHEST 1 VIEW COMPARISON:  12/23/2017 FINDINGS: Right IJ Swan-Ganz catheter is present with tip fairly peripheral in the right descending pulmonary artery. No appreciable pneumothorax. Moderate enlargement of the cardiopericardial silhouette with indistinct pulmonary vasculature and some faint perihilar and basilar airspace opacities. IMPRESSION: 1. Right IJ Swan-Ganz catheter tip: Peripheral in the right descending pulmonary artery. 2. Moderate enlargement of the cardiopericardial silhouette with suspected mild to moderate pulmonary edema. Correlate with pulmonary dural pressure measurements. Electronically Signed  By: Van Clines M.D.   On: 12/23/2017 14:41   Dg Chest Port 1 View  Result Date: 12/23/2017 CLINICAL  DATA:  51 year old male status post cardiac arrest. EXAM: PORTABLE CHEST 1 VIEW COMPARISON:  Chest radiograph dated 12/20/2017 FINDINGS: There is moderate cardiomegaly with findings of vascular congestion and pulmonary edema as seen previously. Overall no significant interval change compared to the earlier radiograph. For no pneumothorax. No acute osseous pathology. IMPRESSION: Cardiomegaly with findings of CHF and pulmonary edema. Overall no significant interval change. Electronically Signed   By: Anner Crete M.D.   On: 12/23/2017 01:11    ERD:EYCXK rhythm, rate 63 (personally reviewed)  TELEMETRY: sinus rhythm, PVC's (personally reviewed)  Assessment/Plan: 1.  VF arrest In the setting of NICM and the flu He has been on guideline directed therapy for several months without recover of EF He meets criteria for ICD implantation Would prefer for him to be off inotropes, have central lines removed, and be afebrile for 24 hours prior to placement to decrease infection risks Keep K>3.9, Mg >1.8 No driving x6 months   2.  PVCs Burden remains high Continue IV amiodarone load  3.  Cardiogenic shock Per AHF team Improving  Dr Curt Bears to see later today. EP will follow  Signed, Chanetta Marshall, NP 12/25/2017 9:37 AM   I have seen and examined this patient with Chanetta Marshall.  Agree with above, note added to reflect my findings.  On exam, RRR, no murmurs, lungs clear.  Presented to the hospital initially with flu also had a VT arrest.  Required chest compressions and cardioversion.  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  Would plan for MRI as the PVCs appear to be coming from a inferior location.  This may give [place] an idea as to the cause of his cardiomyopathy and PVCs.  Due to his history of cardiac arrest, would plan for ICD therapy.  Would prefer this occur at the end of his hospital stay.  Will M. Camnitz MD 12/25/2017 12:26 PM

## 2017-12-26 ENCOUNTER — Inpatient Hospital Stay (HOSPITAL_COMMUNITY): Payer: Commercial Managed Care - PPO

## 2017-12-26 ENCOUNTER — Ambulatory Visit: Payer: Commercial Managed Care - PPO | Admitting: Physician Assistant

## 2017-12-26 DIAGNOSIS — I429 Cardiomyopathy, unspecified: Secondary | ICD-10-CM

## 2017-12-26 LAB — BASIC METABOLIC PANEL
ANION GAP: 12 (ref 5–15)
BUN: 30 mg/dL — AB (ref 6–20)
CHLORIDE: 90 mmol/L — AB (ref 101–111)
CO2: 32 mmol/L (ref 22–32)
Calcium: 8.5 mg/dL — ABNORMAL LOW (ref 8.9–10.3)
Creatinine, Ser: 1.4 mg/dL — ABNORMAL HIGH (ref 0.61–1.24)
GFR calc Af Amer: >60 mL/min (ref >60)
GFR, EST NON AFRICAN AMERICAN: 56 mL/min — AB (ref >60)
GLUCOSE: 167 mg/dL — AB (ref 65–99)
POTASSIUM: 3.9 mmol/L (ref 3.5–5.1)
Sodium: 134 mmol/L — ABNORMAL LOW (ref 135–145)

## 2017-12-26 LAB — COOXEMETRY PANEL
CARBOXYHEMOGLOBIN: 0.9 % (ref 0.5–1.5)
CARBOXYHEMOGLOBIN: 0.9 % (ref 0.5–1.5)
METHEMOGLOBIN: 1.1 % (ref 0.0–1.5)
Methemoglobin: 1 % (ref 0.0–1.5)
O2 Saturation: 72.5 %
O2 Saturation: 54.9 %
TOTAL HEMOGLOBIN: 15.7 g/dL (ref 12.0–16.0)
Total hemoglobin: 14.6 g/dL (ref 12.0–16.0)

## 2017-12-26 MED ORDER — MAGNESIUM SULFATE 2 GM/50ML IV SOLN
2.0000 g | Freq: Once | INTRAVENOUS | Status: AC
  Administered 2017-12-26: 2 g via INTRAVENOUS
  Filled 2017-12-26: qty 50

## 2017-12-26 MED ORDER — GADOBENATE DIMEGLUMINE 529 MG/ML IV SOLN
35.0000 mL | Freq: Once | INTRAVENOUS | Status: AC | PRN
  Administered 2017-12-26: 35 mL via INTRAVENOUS

## 2017-12-26 MED ORDER — LOSARTAN POTASSIUM 25 MG PO TABS
12.5000 mg | ORAL_TABLET | Freq: Two times a day (BID) | ORAL | Status: DC
  Administered 2017-12-26 – 2017-12-28 (×5): 12.5 mg via ORAL
  Filled 2017-12-26 (×5): qty 1

## 2017-12-26 MED ORDER — POTASSIUM CHLORIDE CRYS ER 20 MEQ PO TBCR
20.0000 meq | EXTENDED_RELEASE_TABLET | Freq: Two times a day (BID) | ORAL | Status: DC
  Administered 2017-12-26 – 2017-12-28 (×5): 20 meq via ORAL
  Filled 2017-12-26 (×5): qty 1

## 2017-12-26 NOTE — Plan of Care (Signed)
  Progressing Education: Knowledge of General Education information will improve 12/26/2017 1902 - Progressing by Gennie Alma, RN Health Behavior/Discharge Planning: Ability to manage health-related needs will improve 12/26/2017 1902 - Progressing by Gennie Alma, RN Clinical Measurements: Ability to maintain clinical measurements within normal limits will improve 12/26/2017 1902 - Progressing by Gennie Alma, RN Will remain free from infection 12/26/2017 1902 - Progressing by Gennie Alma, RN Diagnostic test results will improve 12/26/2017 1902 - Progressing by Gennie Alma, RN Respiratory complications will improve 12/26/2017 1902 - Progressing by Gennie Alma, RN Cardiovascular complication will be avoided 12/26/2017 1902 - Progressing by Gennie Alma, RN Activity: Risk for activity intolerance will decrease 12/26/2017 1902 - Progressing by Gennie Alma, RN Nutrition: Adequate nutrition will be maintained 12/26/2017 1902 - Progressing by Gennie Alma, RN Coping: Level of anxiety will decrease 12/26/2017 1902 - Progressing by Gennie Alma, RN Elimination: Will not experience complications related to bowel motility 12/26/2017 1902 - Progressing by Gennie Alma, RN Will not experience complications related to urinary retention 12/26/2017 1902 - Progressing by Gennie Alma, RN Pain Managment: General experience of comfort will improve 12/26/2017 1902 - Progressing by Gennie Alma, RN Safety: Ability to remain free from injury will improve 12/26/2017 1902 - Progressing by Gennie Alma, RN Skin Integrity: Risk for impaired skin integrity will decrease 12/26/2017 1902 - Progressing by Gennie Alma, RN Education: Ability to demonstrate management of disease process will improve 12/26/2017 1902 - Progressing by Gennie Alma, RN Ability to verbalize understanding of medication  therapies will improve 12/26/2017 1902 - Progressing by Gennie Alma, RN Activity: Capacity to carry out activities will improve 12/26/2017 1902 - Progressing by Gennie Alma, RN Cardiac: Ability to achieve and maintain adequate cardiopulmonary perfusion will improve 12/26/2017 1902 - Progressing by Gennie Alma, RN Cardiac: Ability to achieve and maintain adequate cardiopulmonary perfusion will improve 12/26/2017 1902 - Progressing by Gennie Alma, RN Education: Ability to manage disease process will improve 12/26/2017 1902 - Progressing by Gennie Alma, RN Understanding of medication regimen will improve 12/26/2017 1902 - Progressing by Gennie Alma, RN Health Behavior/Discharge Planning: Ability to safely manage health related needs after discharge will improve 12/26/2017 1902 - Progressing by Gennie Alma, RN

## 2017-12-26 NOTE — Progress Notes (Signed)
Advanced Heart Failure Rounding Note   Subjective:    Transferred from [facility] 12/24 with cardiogenic shock in setting of the flu and previous NICM EF 20%  Swan placed. Started on NE and milrinone. Output improved but developed multiple episodes of VT. NE weaned.  No further VT.  Remains on milrinone 0.125  Continues to have 10-15+ PVCs a min. Feeling good this am. Slept better. Denies SOB or CP. No palpitations.   Creatinine continues to improve. Cr 2.25 -> 1.67 -> 1.4. K 3.9 with aggressive supp yesterday.   Negative 2.6L and down another 4 lbs. CVP 5   Objective:   Weight Range:  Vital Signs:   Temp:  [97.7 F (36.5 C)-99 F (37.2 C)] 97.7 F (36.5 C) (12/27 0400) Pulse Rate:  [32-80] 63 (12/27 0600) Resp:  [13-38] 13 (12/27 0600) BP: (84-126)/(58-82) 101/68 (12/27 0600) SpO2:  [82 %-100 %] 95 % (12/27 0600) Weight:  [236 lb 11.2 oz (107.4 kg)] 236 lb 11.2 oz (107.4 kg) (12/27 0400) Last BM Date: 12/20/17  Weight change: Filed Weights   12/24/17 0415 12/25/17 0500 12/26/17 0400  Weight: 255 lb 4.7 oz (115.8 kg) 241 lb 10 oz (109.6 kg) 236 lb 11.2 oz (107.4 kg)    Intake/Output:   Intake/Output Summary (Last 24 hours) at 12/26/2017 0709 Last data filed at 12/26/2017 0600 Gross per 24 hour  Intake 1137.61 ml  Output 3750 ml  Net -2612.39 ml     Physical Exam: General: Sitting up in bed. NAD.  HEENT: Normal Neck: Supple. JVP 5-6. RIJ introducer. Carotids 2+ bilat; no bruits. No thyromegaly or nodule noted. Cor: PMI nondisplaced. Regular with frequent ectopy. +S3. Lungs: CTAB, normal effort. Abdomen: Soft, non-tender, non-distended, no HSM. No bruits or masses. +BS  Extremities: No cyanosis, clubbing, or rash. R and LLE no edema.  Neuro: Alert & orientedx3, cranial nerves grossly intact. moves all 4 extremities w/o difficulty. Affect pleasant   Telemetry: NSR with frequent PVCs, no further VT. Personally reviewed.   Labs: Basic Metabolic  Panel: Recent Labs  Lab 12/23/17 0034 12/23/17 0706 12/23/17 1919 12/24/17 0303 12/25/17 0338 12/26/17 0344  NA 130* 127* 129* 130* 135 134*  K 3.4* 4.8 4.0 3.5 3.2* 3.9  CL 92* 94* 92* 92* 95* 90*  CO2 26 22 25 27 30  32  GLUCOSE 125* 145* 143* 105* 100* 167*  BUN 44* 47* 51* 50* 44* 30*  CREATININE 1.86* 2.22* 2.53* 2.25* 1.67* 1.40*  CALCIUM 8.5* 8.6* 8.5* 8.5* 8.0* 8.5*  MG 2.0 2.3  --   --  1.8  --   PHOS 4.2 4.7*  --   --   --   --     Liver Function Tests: Recent Labs  Lab 12/20/17 1438 12/23/17 0034  AST 40 84*  ALT 20 56  ALKPHOS 55 50  BILITOT 1.9* 0.8  PROT 7.1 6.5  ALBUMIN 4.1 3.3*   No results for input(s): LIPASE, AMYLASE in the last 168 hours. No results for input(s): AMMONIA in the last 168 hours.  CBC: Recent Labs  Lab 12/20/17 1438 12/21/17 0347 12/23/17 0034 12/23/17 1919  WBC 7.9 6.1 5.7 6.2  NEUTROABS 6.9*  --   --   --   HGB 15.3 14.1 14.6 14.3  HCT 46.1 43.2 45.1 42.8  MCV 89.9 91.0 90.9 88.6  PLT 142* 120* 118* 131*    Cardiac Enzymes: Recent Labs  Lab 12/23/17 0034 12/23/17 0706  TROPONINI 0.09* 0.15*    BNP: BNP (  last 3 results) Recent Labs    10/08/17 1710 12/23/17 0034 12/24/17 0303  BNP 1,259.0* 1,017.0* 1,335.4*   ProBNP (last 3 results) No results for input(s): PROBNP in the last 8760 hours.  Other results:  Imaging: No results found.  Medications:    Scheduled Medications: . aspirin EC  81 mg Oral Daily  . Chlorhexidine Gluconate Cloth  6 each Topical Daily  . furosemide  40 mg Oral BID  . heparin  5,000 Units Subcutaneous Q8H  . mouth rinse  15 mL Mouth Rinse BID  . potassium chloride  40 mEq Oral BID  . sodium chloride flush  10-40 mL Intracatheter Q12H  . sodium chloride flush  3 mL Intravenous Q12H  . spironolactone  25 mg Oral Daily    Infusions: . sodium chloride 250 mL (12/25/17 1600)  . sodium chloride Stopped (12/25/17 1900)  . amiodarone 30 mg/hr (12/26/17 0348)  . milrinone 0.125  mcg/kg/min (12/25/17 1828)  . norepinephrine (LEVOPHED) Adult infusion Stopped (12/24/17 6283)  . piperacillin-tazobactam (ZOSYN)  IV 3.375 g (12/26/17 0556)    PRN Medications: sodium chloride, acetaminophen, benzonatate, nitroGLYCERIN, ondansetron (ZOFRAN) IV, sodium chloride, sodium chloride flush, sodium chloride flush  Assessment:   Stephen [name] is a 51 y/o male with longstanding NICM and systolic HF transferred from [facility] for management of worsening HF, Shock, Influenzae A and VF arrest.  Plan/Discussion:    1. Acute on chronic systolic HF with cardiogenic shock - Due to NiCM. 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 Will stop and follow coox.  - Volume status stable and continues to improve on po lasix 40 mg BID. Continue to follow creatinine.  - Off b-blocker and ARB with shock and AKI. Improved, but pressures remain soft. Will hold off for now.  - Creatinine continues to improve.  - Continue spiro 25 mg daily.  - Plan for cMRI this am.   2. VF arrest on 12/24 at [facility] - S/p defibrillation x 1 - Multiple runs VT overnight into 12/24/17. Improved with decreasing norepi and starting IV amio - Continue IV amio - Keep K > 4.0 Mg > 2.0. K improved. Supp Mg.  - Plan for ICD this admit. Needs to be weaned from inotrope support and afebrile x 24 hrs prior to.   3. Acute on chronic renal failure stage III - baseline 1.5. Peaked at 2.5. Now back at baseline at 1.4.  - Continue to follow with milrinone wean.   4. Acute hypoxic respiratory failure - Due primarily to CHF/pulmonary edema but also possible PNA in setting of flu - PCT elevated at 1.1 - Much improved. Now on po lasix. Continue ABX.   5. Infleunzae A - On Tamiflu and respiratory precautions.  - Tmax 99.0. Continue symptomatic treatment. No change.   6. Hypokalemia/hyponatremia - Improved with supp. Now off IV lasix.  - Na improved. Continue free water  restrict  7. Frequent PVCs - Continue amiodarone. amio. Unclear if this is related to NICM or not. Would like to see if EF recovers some with suppression of PVCs prior to going to advanced therapies, if possible. - Continues to have 10-15+ PVCs a minute.   Length of Stay: 3  Annamaria Helling  12/26/2017, 7:09 AM  Advanced Heart Failure Team Pager [phone] (M-F; 7a - 4p)  Please contact [facility] for night-coverage after hours (4p -7a ) and weekends on [email or web address]  Patient seen and examined with the above-signed  Advanced Practice Provider and/or Housestaff. I personally reviewed laboratory data, imaging studies and relevant notes. I independently examined the patient and formulated the important aspects of the plan. I have edited the note to reflect any of my changes or salient points. I have personally discussed the plan with the patient and/or family.  He remains on intoropes but co-ox ok. Volume status much improved. Renal function getting better. Tmax 99.0 overnight. Still with frequent PVCs but burden slightly decreased on IV amio.   Will stop milrinone today. Remove Foley. Hopefully get IJ introducer out later today. Plan cMRI today. Add losartan.  Discussed with EP. ICD prior to d/c - possibly tomorrow.   Glori Bickers, MD  9:41 AM

## 2017-12-26 NOTE — Progress Notes (Signed)
CARDIAC REHAB PHASE I   PRE:  Rate/Rhythm: 26 SR with PVCs (less than yesterday)    BP: sitting 106/72    SaO2: 96 4L  MODE:  Ambulation: 740 ft   POST:  Rate/Rhythm: 80 SR with PVC trigeminy    BP: sitting 133/87     SaO2: 99 3L after 1 lap, 98 2L after second lap, 97 RA in recliner  Pt up in recliner, eager to walk. Stood independently and walked with standby assist. Steady. Used x2 assist for Financial controller. After 1 lap he stated his SOB is at his baseline and he wanted to walk another lap. Decreased O2 and tolerated well. Return to recliner and put on RA. Pt feels well, thankful for walk. Will f/u as x1 if less equipment.  [place], ACSM 12/26/2017 1:32 PM

## 2017-12-27 ENCOUNTER — Ambulatory Visit: Payer: Commercial Managed Care - PPO | Admitting: Family

## 2017-12-27 ENCOUNTER — Telehealth: Payer: Self-pay | Admitting: Cardiovascular Disease

## 2017-12-27 DIAGNOSIS — I5023 Acute on chronic systolic (congestive) heart failure: Secondary | ICD-10-CM

## 2017-12-27 DIAGNOSIS — I472 Ventricular tachycardia: Secondary | ICD-10-CM

## 2017-12-27 LAB — COMPREHENSIVE METABOLIC PANEL
ALBUMIN: 2.6 g/dL — AB (ref 3.5–5.0)
ALT: 37 U/L (ref 17–63)
AST: 42 U/L — AB (ref 15–41)
Alkaline Phosphatase: 74 U/L (ref 38–126)
Anion gap: 9 (ref 5–15)
BILIRUBIN TOTAL: 1.1 mg/dL (ref 0.3–1.2)
BUN: 22 mg/dL — AB (ref 6–20)
CHLORIDE: 95 mmol/L — AB (ref 101–111)
CO2: 33 mmol/L — ABNORMAL HIGH (ref 22–32)
CREATININE: 1.29 mg/dL — AB (ref 0.61–1.24)
Calcium: 8.8 mg/dL — ABNORMAL LOW (ref 8.9–10.3)
GFR calc Af Amer: >60 mL/min (ref >60)
GFR calc non Af Amer: >60 mL/min (ref >60)
GLUCOSE: 106 mg/dL — AB (ref 65–99)
POTASSIUM: 4.1 mmol/L (ref 3.5–5.1)
Sodium: 137 mmol/L (ref 135–145)
TOTAL PROTEIN: 6.2 g/dL — AB (ref 6.5–8.1)

## 2017-12-27 LAB — COOXEMETRY PANEL
Carboxyhemoglobin: 0.9 % (ref 0.5–1.5)
Methemoglobin: 1 % (ref 0.0–1.5)
O2 Saturation: 57.2 %
Total hemoglobin: 15.8 g/dL (ref 12.0–16.0)

## 2017-12-27 MED ORDER — FUROSEMIDE 40 MG PO TABS
40.0000 mg | ORAL_TABLET | Freq: Every day | ORAL | Status: DC
  Administered 2017-12-27 – 2017-12-31 (×5): 40 mg via ORAL
  Filled 2017-12-27 (×6): qty 1

## 2017-12-27 MED ORDER — AMIODARONE HCL 200 MG PO TABS
200.0000 mg | ORAL_TABLET | Freq: Two times a day (BID) | ORAL | Status: DC
  Administered 2017-12-27 – 2017-12-31 (×9): 200 mg via ORAL
  Filled 2017-12-27 (×9): qty 1

## 2017-12-27 MED ORDER — ATORVASTATIN CALCIUM 40 MG PO TABS
40.0000 mg | ORAL_TABLET | Freq: Every day | ORAL | Status: DC
  Administered 2017-12-27 – 2017-12-30 (×4): 40 mg via ORAL
  Filled 2017-12-27 (×4): qty 1

## 2017-12-27 MED ORDER — CLOPIDOGREL BISULFATE 75 MG PO TABS
75.0000 mg | ORAL_TABLET | Freq: Every day | ORAL | Status: DC
  Administered 2017-12-27 – 2017-12-31 (×5): 75 mg via ORAL
  Filled 2017-12-27 (×5): qty 1

## 2017-12-27 NOTE — Progress Notes (Signed)
Progress Note  Patient Name: Stephen [name] Date of Encounter: 12/27/2017  Primary Cardiologist: Dr. Fletcher Anon  Subjective   Finally feeling like he is starting to feel like himeself, no CP, less SOB  Inpatient Medications    Scheduled Meds: . amiodarone  200 mg Oral BID  . aspirin EC  81 mg Oral Daily  . atorvastatin  40 mg Oral q1800  . Chlorhexidine Gluconate Cloth  6 each Topical Daily  . clopidogrel  75 mg Oral Daily  . furosemide  40 mg Oral Daily  . heparin  5,000 Units Subcutaneous Q8H  . losartan  12.5 mg Oral BID  . mouth rinse  15 mL Mouth Rinse BID  . potassium chloride  20 mEq Oral BID  . sodium chloride flush  10-40 mL Intracatheter Q12H  . sodium chloride flush  3 mL Intravenous Q12H  . spironolactone  25 mg Oral Daily   Continuous Infusions: . sodium chloride 250 mL (12/27/17 0700)  . sodium chloride Stopped (12/25/17 1900)  . norepinephrine (LEVOPHED) Adult infusion Stopped (12/24/17 4782)   PRN Meds: sodium chloride, acetaminophen, benzonatate, nitroGLYCERIN, ondansetron (ZOFRAN) IV, sodium chloride, sodium chloride flush, sodium chloride flush   Vital Signs    Vitals:   12/27/17 0400 12/27/17 0500 12/27/17 0700 12/27/17 0824  BP: 100/79 (!) 87/69 109/81 114/78  Pulse: 60 64  68  Resp: (!) 25 13 (!) 21 19  Temp:    97.8 F (36.6 C)  TempSrc:    Oral  SpO2: 96% 96%  97%  Weight: 235 lb 0.2 oz (106.6 kg)     Height:        Intake/Output Summary (Last 24 hours) at 12/27/2017 0918 Last data filed at 12/27/2017 0700 Gross per 24 hour  Intake 1793.97 ml  Output 2355 ml  Net -561.03 ml   Filed Weights   12/25/17 0500 12/26/17 0400 12/27/17 0400  Weight: 241 lb 10 oz (109.6 kg) 236 lb 11.2 oz (107.4 kg) 235 lb 0.2 oz (106.6 kg)    Telemetry    SR, occ PVCs, no VT - Personally Reviewed  ECG    No new EKGs - Personally Reviewed  Physical Exam   GEN: No acute distress, AAO x3 Neck: No JVD Cardiac: RRR, no murmurs, rubs, or gallops.    Respiratory: soft crackles at bases, L>R. GI: Soft, nontender, non-distended  MS: No edema; No deformity. Neuro:  Nonfocal  Psych: Normal affect   Labs    Chemistry Recent Labs  Lab 12/20/17 1438  12/23/17 0034  12/25/17 0338 12/26/17 0344 12/27/17 0411  NA 136   < > 130*   < > 135 134* 137  K 3.4*   < > 3.4*   < > 3.2* 3.9 4.1  CL 100*   < > 92*   < > 95* 90* 95*  CO2 22   < > 26   < > 30 32 33*  GLUCOSE 127*   < > 125*   < > 100* 167* 106*  BUN 22*   < > 44*   < > 44* 30* 22*  CREATININE 1.41*   < > 1.86*   < > 1.67* 1.40* 1.29*  CALCIUM 9.2   < > 8.5*   < > 8.0* 8.5* 8.8*  PROT 7.1  --  6.5  --   --   --  6.2*  ALBUMIN 4.1  --  3.3*  --   --   --  2.6*  AST 40  --  84*  --   --   --  42*  ALT 20  --  56  --   --   --  37  ALKPHOS 55  --  50  --   --   --  74  BILITOT 1.9*  --  0.8  --   --   --  1.1  GFRNONAA 56*   < > 40*   < > 46* 56* >60  GFRAA >60   < > 46*   < > 53* >60 >60  ANIONGAP 14   < > 12   < > 10 12 9    < > = values in this interval not displayed.     Hematology Recent Labs  Lab 12/21/17 0347 12/23/17 0034 12/23/17 1919  WBC 6.1 5.7 6.2  RBC 4.74 4.97 4.83  HGB 14.1 14.6 14.3  HCT 43.2 45.1 42.8  MCV 91.0 90.9 88.6  MCH 29.7 29.4 29.6  MCHC 32.6 32.3 33.4  RDW 15.2* 15.5* 15.0  PLT 120* 118* 131*    Cardiac Enzymes Recent Labs  Lab 12/23/17 0034 12/23/17 0706  TROPONINI 0.09* 0.15*   No results for input(s): TROPIPOC in the last 168 hours.   BNP Recent Labs  Lab 12/23/17 0034 12/24/17 0303  BNP 1,017.0* 1,335.4*     DDimer No results for input(s): DDIMER in the last 168 hours.   Radiology    No results found.  Cardiac Studies   10/25/17: TTE Study Conclusions - Left ventricle: The cavity size was severely dilated. Systolic   function was severely reduced. The estimated ejection fraction   was <20% Diffuse hypokinesis. Spontaneous contrast noted in the   LV. Features are consistent with a pseudonormal left  ventricular   filling pattern, with concomitant abnormal relaxation and   increased filling pressure (grade 2 diastolic dysfunction). - Mitral valve: There was mild to moderate regurgitation. - Left atrium: The atrium was moderately dilated. - Right ventricle: The cavity size was mildly dilated. Wall   thickness was normal. - Pulmonary arteries: Systolic pressure was moderately elevated. PA   peak pressure: 49 mm Hg (S). - Inferior vena cava: The vessel was dilated. The respirophasic   diameter changes were blunted (< 50%), consistent with elevated   central venous pressure.  09/09/17: LHC Normal coronary arteries LV EDP 31 Medical management of nonischemic heart failure  Patient Profile     51 y.o. male with hx of NICM, chronic CHF, PVC's, HTN, TIA, CRI initally admitted to [facility] with SOB in the setting of Influenza A, developed acute/chronic CHF, hypotension, shock, 12/23/17 had cardiac arrest with VF, had brief CPR and one defibrillation, and required pressor support, no intubation, transferred to [facility] for further management.  Assessment & Plan    1. VF arrest 12//24/18 at [facility]     Hx of frequent PVCs     PVCs apear to be less frequent in last 24 hours     Remains on amiodarone gtt     Cardiac MRI done yesterday, result is pending     Dr. Rayann Heman has seen the patient, patient agreeable to ICD implant though hesitant to pursue today, and preferably, we would like to allow a couple days with central line out, will plan implant for Monday  Do driving 6 months, patient is made aware  2. Acute/chronic CHF/cardiogenic shock     AFH team is managing     Off neo for 48hours     Off milrinone for 24 hours  cumulatively fluid negative -9670ml, down 15lbs      Central line to d/c today   3. Influeza A, ?pneumonia     S/p tamiflu, on zosyn       For questions or updates, please contact [facility] Please consult [email or web address] for contact info under Cardiology/STEMI.       Signed, Baldwin Jamaica, PA-C  12/27/2017, 9:18 AM     I have seen, examined the patient, and reviewed the above assessment and plan.  Changes to above are made where necessary.  On exam, disheveled and ill appearing.  R IJ cordis line in place.  He has made substantial proceed, but I think needs the weekend to get ready for ICD implantation.  Please remove CVL now.  Ok to proceed with physical therapy/ rehab over the weekend.  I have placed on EP lab schedule for Dr Caryl Comes to proceed with ICD implantation on Monday.  Pt is aware of plan and agreeable.  Co Sign: Thompson Grayer, MD 12/27/2017 11:10 AM

## 2017-12-27 NOTE — Telephone Encounter (Signed)
Received FMLA forms and $25 fee (check)  from patient brother in law  Spoke with [name] (sister) she is aware the [employer] packet with instructions fee info and ROI form were given to her husband to be completed.  She preferred the forms and fee be mailed today by our office and she will fax or mail the roi after the patient signs.    Copies given to Jaquelyn Bitter (Brother in [place]) and originals mailed to [employer]

## 2017-12-27 NOTE — Progress Notes (Signed)
Advanced Heart Failure Rounding Note   Subjective:    Transferred from [facility] 12/24 with cardiogenic shock in setting of the flu and previous NICM EF 20%  Swan placed. Started on NE and milrinone. Output improved but developed multiple episodes of VT. NE weaned.  No further VT.  Coox 57.2% off milrinone.     cMRI done 12/26/17. Results pending.  Feeling great this am. Pressures soft last night. Denies lightheadedness or dizziness. No CP or palpitations.   Creatinine continues to improve. Cr 2.25 -> 1.67 -> 1.4 -> 1.29. K 4.1  Negative 740 cc and down 1 lb.   Objective:   Weight Range:  Vital Signs:   Temp:  [97.8 F (36.6 C)-98.6 F (37 C)] 97.8 F (36.6 C) (12/28 0026) Pulse Rate:  [50-85] 64 (12/28 0500) Resp:  [13-29] 13 (12/28 0500) BP: (70-138)/(43-92) 87/69 (12/28 0500) SpO2:  [83 %-99 %] 96 % (12/28 0500) Weight:  [235 lb 0.2 oz (106.6 kg)] 235 lb 0.2 oz (106.6 kg) (12/28 0400) Last BM Date: 12/22/17  Weight change: Filed Weights   12/25/17 0500 12/26/17 0400 12/27/17 0400  Weight: 241 lb 10 oz (109.6 kg) 236 lb 11.2 oz (107.4 kg) 235 lb 0.2 oz (106.6 kg)    Intake/Output:   Intake/Output Summary (Last 24 hours) at 12/27/2017 0715 Last data filed at 12/27/2017 9211 Gross per 24 hour  Intake 1807.37 ml  Output 2550 ml  Net -742.63 ml    Physical Exam: General: NAD  HEENT: Normal Neck: Supple. JVP 5-6. Carotids 2+ bilat; no bruits. No thyromegaly or nodule noted. Cor: PMI nondisplaced. Regular with frequent ectopy. +S3 Lungs: CTAB, normal effort. Abdomen: Soft, non-tender, non-distended, no HSM. No bruits or masses. +BS  Extremities: No cyanosis, clubbing, or rash. R and LLE no edema.  Neuro: Alert & orientedx3, cranial nerves grossly intact. moves all 4 extremities w/o difficulty. Affect pleasant   Telemetry: NSR with frequent PVCs, no further VT, personally reviewed.   Labs: Basic Metabolic Panel: Recent Labs  Lab 12/23/17 0034  12/23/17 0706 12/23/17 1919 12/24/17 0303 12/25/17 0338 12/26/17 0344 12/27/17 0411  NA 130* 127* 129* 130* 135 134* 137  K 3.4* 4.8 4.0 3.5 3.2* 3.9 4.1  CL 92* 94* 92* 92* 95* 90* 95*  CO2 26 22 25 27 30  32 33*  GLUCOSE 125* 145* 143* 105* 100* 167* 106*  BUN 44* 47* 51* 50* 44* 30* 22*  CREATININE 1.86* 2.22* 2.53* 2.25* 1.67* 1.40* 1.29*  CALCIUM 8.5* 8.6* 8.5* 8.5* 8.0* 8.5* 8.8*  MG 2.0 2.3  --   --  1.8  --   --   PHOS 4.2 4.7*  --   --   --   --   --     Liver Function Tests: Recent Labs  Lab 12/20/17 1438 12/23/17 0034 12/27/17 0411  AST 40 84* 42*  ALT 20 56 37  ALKPHOS 55 50 74  BILITOT 1.9* 0.8 1.1  PROT 7.1 6.5 6.2*  ALBUMIN 4.1 3.3* 2.6*   No results for input(s): LIPASE, AMYLASE in the last 168 hours. No results for input(s): AMMONIA in the last 168 hours.  CBC: Recent Labs  Lab 12/20/17 1438 12/21/17 0347 12/23/17 0034 12/23/17 1919  WBC 7.9 6.1 5.7 6.2  NEUTROABS 6.9*  --   --   --   HGB 15.3 14.1 14.6 14.3  HCT 46.1 43.2 45.1 42.8  MCV 89.9 91.0 90.9 88.6  PLT 142* 120* 118* 131*    Cardiac  Enzymes: Recent Labs  Lab 12/23/17 0034 12/23/17 0706  TROPONINI 0.09* 0.15*    BNP: BNP (last 3 results) Recent Labs    10/08/17 1710 12/23/17 0034 12/24/17 0303  BNP 1,259.0* 1,017.0* 1,335.4*   ProBNP (last 3 results) No results for input(s): PROBNP in the last 8760 hours.  Other results:  Imaging: No results found.  Medications:    Scheduled Medications: . aspirin EC  81 mg Oral Daily  . Chlorhexidine Gluconate Cloth  6 each Topical Daily  . furosemide  40 mg Oral BID  . heparin  5,000 Units Subcutaneous Q8H  . losartan  12.5 mg Oral BID  . mouth rinse  15 mL Mouth Rinse BID  . potassium chloride  20 mEq Oral BID  . sodium chloride flush  10-40 mL Intracatheter Q12H  . sodium chloride flush  3 mL Intravenous Q12H  . spironolactone  25 mg Oral Daily    Infusions: . sodium chloride 250 mL (12/27/17 0600)  . sodium  chloride Stopped (12/25/17 1900)  . amiodarone 30 mg/hr (12/27/17 0600)  . norepinephrine (LEVOPHED) Adult infusion Stopped (12/24/17 4097)  . piperacillin-tazobactam (ZOSYN)  IV 3.375 g (12/27/17 0558)    PRN Medications: sodium chloride, acetaminophen, benzonatate, nitroGLYCERIN, ondansetron (ZOFRAN) IV, sodium chloride, sodium chloride flush, sodium chloride flush  Assessment:   Stephen [name] is a 51 y/o male with longstanding NICM and systolic HF transferred from [facility] for management of worsening HF, Shock, Influenzae A and VF arrest.  Plan/Discussion:    1. Acute on chronic systolic HF with cardiogenic shock - Due to NiCM. EF 20% - Cath 9/18 with non-obstructive CAD - Co-ox 57.2% off milrinone.  - Volume status stable on po lasix 40 mg BID.  - Off b-blocker and ARB with shock and AKI. Improved, but pressures remain soft. Will hold off for now.  - Creatinine continues to improve.  - No room to add other meds today.  - Continue spiro 25 mg daily.  - cMRI yesterday. Results pending.   2. VF arrest on 12/24 at [facility] - S/p defibrillation x 1 - Multiple runs VT overnight into 12/24/17. Improved with decreasing norepi and starting IV amio - Continue IV amio - Keep K > 4.0 Mg > 2.0. K improved. Supp Mg.  - Plan for ICD today.   3. Acute on chronic renal failure stage III - baseline 1.5. Peaked at 2.5.  - Creatinine continues to improve. Continue to follow.   4. Acute hypoxic respiratory failure - Due primarily to CHF/pulmonary edema but also possible PNA in setting of flu - PCT elevated at 1.1 - Much improved. Now on po lasix.  - Remains on zosyn. Will discuss stop date with Pharm-D and MD.   5. Infleunzae A - Tamiflu completes 12/26 - Afebrile. No change.   6. Hypokalemia/hyponatremia - Resolved. Continue supp - Continue free water restrict  7. Frequent PVCs - Continue amiodarone. Unclear if this is related to NICM or not. Would like to see if EF recovers some  with suppression of PVCs prior to going to advanced therapies, if possible.   Doing well. NPO for likely ICD today. No room to add meds.   Length of Stay: 4  Annamaria Helling  12/27/2017, 7:15 AM  Advanced Heart Failure Team Pager [phone] (M-F; 7a - 4p)  Please contact [facility] for night-coverage after hours (4p -7a ) and weekends on [email or web address]  Improving but still several issues outstanding. Now off inotropes. Co-ox ok. BP was  low last night. CVP 5-6.  Will cut back lasix to 40 daily. Continue spiro and low-dose losartan. No carvedilol yet. Do not restart ivabradine at this point.   Will pull introducer. Can stop Zosyn. D/w Dr. Rayann Heman. Likely ICD on Monday after line holiday. Change amio to 200 bid (on for frequent PVCs)   Await results on MRI. Ideally would hope that EF gets better with suppression of PVCs but if not will need to further risk stratify for advanced therapies.   Plan: 1) titrate meds over the weekend 2) ICD Monday 3) Continue amio for PVC suppression 4) can go to the floor today  Glori Bickers, MD  9:07 AM

## 2017-12-27 NOTE — Progress Notes (Deleted)
Patient ID: Stephen [name], male    DOB: [birth year], 51 y.o.   MRN: [id number]  HPI  Stephen [name] is a 51 y/o male with a history of TIA, HTN and HF with reduced ejection fraction who returns for a follow-up.   Last echo done 05/21/17 showed an EF of 20-25% which is stable from previous echo which was done 06/22/16 with an EF of 20%, mild MR, no aortic stenosis or regurgitation. Last saw his cardiologist in July 2017. Cardiac catheterization done 09/09/17 revealing normal coronary arteries.  Was in the ED 09/27/17 due to shortness of breath. Treated and released. Admitted 09/08/17 due to chest pain and HF exacerbation. Cardiology consult done. Cardiac catheterization done which revealed normal coronary arteries. CP musculoskeletal in origin. Chest CT negative for embolism. Discharged home after 2 days. Admitted 07/29/17 due to VT. Initially needed an amiodarone drip and then transitioned to oral amiodarone. Cardiac markers were negative X3. Discharged home after 2 days. Admitted 06/18/17 due to HF exacerbation. Cardiology consult obtained. Discharged home after 2 days. Went to Urgent Care 06/18/17 and was directed to the ED.  Admitted 05/20/17 due to HF exacerbation. Initially needed IV diuretics. Cardiology consult obtained. Discharged the next day. Was at Urgent Care on 03/22/17 due to bronchitis. Treated and released home.   Patient presents today for a follow-up visit with a chief complaint of   Past Medical History:  Diagnosis Date  . Arrhythmia   . Cardiac arrest ([place]) 12/23/2017   Brief V-fib arrest  . CHF (congestive heart failure) (HCC)    nonischemic cardiomyopathy, EF 25%  . Hypertension   . NICM (nonischemic cardiomyopathy) ([place])   . TIA (transient ischemic attack) 06/21/16   Past Surgical History:  Procedure Laterality Date  . KNEE SURGERY Right   . LEFT HEART CATH AND CORONARY ANGIOGRAPHY N/A 09/09/2017   Procedure: LEFT HEART CATH AND CORONARY ANGIOGRAPHY;  Surgeon: Teodoro Spray,  MD;  Location: [place] [facility];  Service: Cardiovascular;  Laterality: N/A;  . VASECTOMY      Family History  Problem Relation Age of Onset  . Hypertension Mother   . Heart failure Mother   . Hypertension Father   . CAD Father   . Heart attack Father     Social History   Tobacco Use  . Smoking status: Former Smoker    Packs/day: 0.25    Years: 20.00    Pack years: 5.00    Types: Cigarettes    Last attempt to quit: 02/20/2016    Years since quitting: 1.8  . Smokeless tobacco: Never Used  Substance Use Topics  . Alcohol use: Yes    Alcohol/week: 0.0 oz    Comment: beer occasional    Allergies  Allergen Reactions  . Lisinopril Cough     Review of Systems  Constitutional: Positive for fatigue. Negative for appetite change.  HENT: Negative for congestion, postnasal drip and sore throat.   Eyes: Negative.   Respiratory: Negative for cough, chest tightness, shortness of breath and wheezing.   Cardiovascular: Positive for leg swelling (at the end of the work day). Negative for chest pain and palpitations.  Gastrointestinal: Negative for abdominal distention, abdominal pain and constipation.  Endocrine: Negative.   Genitourinary: Negative.   Musculoskeletal: Positive for back pain. Negative for neck pain.  Skin: Negative.   Allergic/Immunologic: Negative.   Neurological: Negative for dizziness and light-headedness.  Hematological: Negative for adenopathy. Does not bruise/bleed easily.  Psychiatric/Behavioral: Positive for sleep disturbance (wake up  feeling tired; sleeping on 1 pillow). Negative for dysphoric mood. The patient is nervous/anxious (at times ).      Lab Results  Component Value Date   CREATININE 1.29 (H) 12/27/2017   CREATININE 1.40 (H) 12/26/2017   CREATININE 1.67 (H) 12/25/2017    Physical Exam  Constitutional: He is oriented to person, place, and time. He appears well-developed and well-nourished.  HENT:  Head: Normocephalic and atraumatic.   Neck: Normal range of motion. Neck supple.  Cardiovascular: Normal rate and regular rhythm.  Pulmonary/Chest: Effort normal. He has no wheezes. He has no rales.  Abdominal: Soft. He exhibits no distension. There is no tenderness.  Musculoskeletal: He exhibits edema (1+ pitting edema above sock line bilaterally). He exhibits no tenderness.  Neurological: He is alert and oriented to person, place, and time.  Skin: Skin is warm and dry.  Psychiatric: He has a normal mood and affect. His behavior is normal. Thought content normal.  Nursing note and vitals reviewed.    Assessment & Plan:  1: Chronic heart failure with reduced ejection fraction-  - NYHA Class II - euvolemic - Continue daily weights & call for overnight weight gain of >2 pounds or a weekly weight gain of >5 pounds; weight stable since he was last here - not adding salt but does BBQ on a large cooker and does taste what he's cooking and does use salt when he's cooking. Discussed the importance of closely following a 2000mg  sodium diet; he estimates that he's getting 800-900mg  sodium daily - saw cardiologist Clayborn Bigness) 08/07/17 and returns in 6 months - having a 2nd opinion with Dr. Fletcher Anon on 10/08/17 - does elevate legs which helps reduce edema and then it returns after standing all day at work - going to get some compression socks to wear; instructed to put them on in the morning and remove them at bedtime - BNP on 09/27/17 reviewed and was elevated at 2168.9 - changed to torsemide 20mg  twice daily at last visit & rales have resolved - BMP & BNP drawn today  2: HTN-  - BP looks good today - BMP done 09/27/17 reviewed and shows sodium 135, potassium 3.3 and GFR >60  3: COPD- - using inhalers - PFT's done 08/01/17 - saw pulmonologist ([place]) 07/25/17 and returns 10/21/17  Reviewed medication bottles with the patient.  Return in 2 months or sooner for any questions/problems before then.

## 2017-12-27 NOTE — Progress Notes (Signed)
CARDIAC REHAB PHASE I   PRE:  Rate/Rhythm: 70 SR with PVCs    BP: sitting 106/79    SaO2: 98 4L, 96 RA  MODE:  Ambulation: 740 ft   POST:  Rate/Rhythm: 76 SR with PVC    BP: sitting 107/72     SaO2: 96 RA  Pt tolerated well. Able to walk on RA. SaO2 hard to register but then 96 RA x2 walking. He has some SOB, which he sts is his baseline and that he is "fine". Left off O2 and notified RN. Reviewed HF with pt, he is very well versed on low sodium diet, daily wts, and signs/sx. [place], ACSM 12/27/2017 2:32 PM

## 2017-12-27 NOTE — Telephone Encounter (Signed)
Received records request [place] , forwarded to [facility] for processing.

## 2017-12-28 DIAGNOSIS — I493 Ventricular premature depolarization: Secondary | ICD-10-CM

## 2017-12-28 LAB — BASIC METABOLIC PANEL
ANION GAP: 9 (ref 5–15)
BUN: 16 mg/dL (ref 6–20)
CHLORIDE: 98 mmol/L — AB (ref 101–111)
CO2: 31 mmol/L (ref 22–32)
Calcium: 9.2 mg/dL (ref 8.9–10.3)
Creatinine, Ser: 1.2 mg/dL (ref 0.61–1.24)
GFR calc Af Amer: >60 mL/min (ref >60)
Glucose, Bld: 113 mg/dL — ABNORMAL HIGH (ref 65–99)
POTASSIUM: 4.6 mmol/L (ref 3.5–5.1)
SODIUM: 138 mmol/L (ref 135–145)

## 2017-12-28 LAB — MAGNESIUM: MAGNESIUM: 2.2 mg/dL (ref 1.7–2.4)

## 2017-12-28 MED ORDER — DIGOXIN 125 MCG PO TABS
0.1250 mg | ORAL_TABLET | Freq: Every day | ORAL | Status: DC
  Administered 2017-12-28 – 2017-12-31 (×4): 0.125 mg via ORAL
  Filled 2017-12-28 (×4): qty 1

## 2017-12-28 MED ORDER — LOSARTAN POTASSIUM 25 MG PO TABS
25.0000 mg | ORAL_TABLET | Freq: Two times a day (BID) | ORAL | Status: DC
  Administered 2017-12-28 – 2017-12-31 (×6): 25 mg via ORAL
  Filled 2017-12-28 (×6): qty 1

## 2017-12-28 NOTE — Progress Notes (Signed)
CARDIAC REHAB PHASE I   PRE:  Rate/Rhythm: 22 SR with PVCs  BP:  Sitting: 112/85      SaO2: 99% RA  MODE:  Ambulation: 300 ft   POST:  Rate/Rhythm: 80 RS with PVCs   BP:  Sitting: 142/95      SaO2: 98%  Pt in recliner. Pt ambulated 300 ft on RA. Pt ambulated with steady gait. Pt had no complaints of CP or dizziness, but had moderate SOB. Pt stated he felt "fine" but could hear pt heavier breathing. Pt's O2 stat was 98% RA during ambulation. Pt returned to recliner. Reviewed exercise guidelines and ICD informational video with patient. Pt is not sure about outpatient cardiac rehab due to conflicting work schedule. Will continue to follow patient. Call bell within reach.   [phone]  Carma Lair MS, ACSM CEP  12:21 PM 12/28/2017

## 2017-12-28 NOTE — Progress Notes (Addendum)
Patient ID: Stephen [name], male   DOB: June 28, 1966, 51 y.o.   MRN: [id number]    Advanced Heart Failure Rounding Note   Subjective:    Transferred from [facility] 12/24 with cardiogenic shock in setting of the flu and previous NICM EF 20%  Swan placed. Started on NE and milrinone. Output improved but developed multiple episodes of VT. Weaned off milrinone and norepinephrine.   No VT, still with PVCs.   No complaints this morning.    Objective:   Weight Range:  Vital Signs:   Temp:  [97.6 F (36.4 C)-98.1 F (36.7 C)] 98.1 F (36.7 C) (12/29 0537) Pulse Rate:  [60-88] 62 (12/29 0800) Resp:  [18-32] 18 (12/29 0537) BP: (97-140)/(68-87) 140/70 (12/29 0800) SpO2:  [88 %-100 %] 95 % (12/29 0537) Weight:  [227 lb 8 oz (103.2 kg)-232 lb 9.4 oz (105.5 kg)] 227 lb 8 oz (103.2 kg) (12/29 0537) Last BM Date: 12/27/17  Weight change: Filed Weights   12/27/17 0400 12/27/17 1528 12/28/17 0537  Weight: 235 lb 0.2 oz (106.6 kg) 232 lb 9.4 oz (105.5 kg) 227 lb 8 oz (103.2 kg)    Intake/Output:   Intake/Output Summary (Last 24 hours) at 12/28/2017 0853 Last data filed at 12/28/2017 0602 Gross per 24 hour  Intake 1110 ml  Output 1750 ml  Net -640 ml    Physical Exam: General: NAD Neck: No JVD, no thyromegaly or thyroid nodule.  Lungs: Clear to auscultation bilaterally with normal respiratory effort. CV: Nondisplaced PMI.  Heart regular with some ectopy, no S3/S4, no murmur.  No peripheral edema.   Abdomen: Soft, nontender, no hepatosplenomegaly, no distention.  Skin: Intact without lesions or rashes.  Neurologic: Alert and oriented x 3.  Psych: Normal affect. Extremities: No clubbing or cyanosis.  HEENT: Normal.    Telemetry: NSR with PVCs, personally reviewed  Labs: Basic Metabolic Panel: Recent Labs  Lab 12/23/17 0034 12/23/17 0706  12/24/17 0303 12/25/17 0338 12/26/17 0344 12/27/17 0411 12/28/17 0422  NA 130* 127*   < > 130* 135 134* 137 138  K 3.4* 4.8   < > 3.5  3.2* 3.9 4.1 4.6  CL 92* 94*   < > 92* 95* 90* 95* 98*  CO2 26 22   < > 27 30 32 33* 31  GLUCOSE 125* 145*   < > 105* 100* 167* 106* 113*  BUN 44* 47*   < > 50* 44* 30* 22* 16  CREATININE 1.86* 2.22*   < > 2.25* 1.67* 1.40* 1.29* 1.20  CALCIUM 8.5* 8.6*   < > 8.5* 8.0* 8.5* 8.8* 9.2  MG 2.0 2.3  --   --  1.8  --   --  2.2  PHOS 4.2 4.7*  --   --   --   --   --   --    < > = values in this interval not displayed.    Liver Function Tests: Recent Labs  Lab 12/23/17 0034 12/27/17 0411  AST 84* 42*  ALT 56 37  ALKPHOS 50 74  BILITOT 0.8 1.1  PROT 6.5 6.2*  ALBUMIN 3.3* 2.6*   No results for input(s): LIPASE, AMYLASE in the last 168 hours. No results for input(s): AMMONIA in the last 168 hours.  CBC: Recent Labs  Lab 12/23/17 0034 12/23/17 1919  WBC 5.7 6.2  HGB 14.6 14.3  HCT 45.1 42.8  MCV 90.9 88.6  PLT 118* 131*    Cardiac Enzymes: Recent Labs  Lab 12/23/17 0034 12/23/17 0706  TROPONINI 0.09* 0.15*    BNP: BNP (last 3 results) Recent Labs    10/08/17 1710 12/23/17 0034 12/24/17 0303  BNP 1,259.0* 1,017.0* 1,335.4*   ProBNP (last 3 results) No results for input(s): PROBNP in the last 8760 hours.  Other results:  Imaging: No results found.  Medications:    Scheduled Medications: . amiodarone  200 mg Oral BID  . aspirin EC  81 mg Oral Daily  . atorvastatin  40 mg Oral q1800  . clopidogrel  75 mg Oral Daily  . furosemide  40 mg Oral Daily  . heparin  5,000 Units Subcutaneous Q8H  . losartan  25 mg Oral BID  . mouth rinse  15 mL Mouth Rinse BID  . sodium chloride flush  10-40 mL Intracatheter Q12H  . spironolactone  25 mg Oral Daily    Infusions:   PRN Medications: acetaminophen, benzonatate, nitroGLYCERIN, ondansetron (ZOFRAN) IV, sodium chloride, sodium chloride flush  Assessment:   Stephen [name] is a 51 y/o male with longstanding NICM and systolic HF transferred from [facility] for management of worsening HF, Shock, Influenzae A and VF  arrest.  Plan/Discussion:    1. Acute on chronic systolic HF with cardiogenic shock: Due to NiCM. EF 20%.  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  Now off milrinone.  Volume status looks optimized on po Lasix.  - Continue po Lasix.  - No Coreg yet with recent cardiogenic shock.  - BP stable, can increase losartan to 25 mg bid.  - Continue spironolactone 25 daily.  - Add digoxin 0.125 with stable BP.  - Will review cMRI today.  2. VF arrest on 12/24 at [facility]: S/p defibrillation x 1. Multiple runs VT overnight into 12/24/17. Improved with decreasing norepi and starting IV amio - Continue po amiodarone.  - Plan for ICD on Monday.  3. Acute on chronic renal failure stage III: Creatinine 1.2 today, back to baseline.  4. Influenza A: Tamiflu completed 12/26. Afebrile. No change.  5. 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  - Continue amiodarone 200 mg bid.    Length of Stay: [place], [place]  12/28/2017, 8:53 AM  Advanced Heart Failure Team Pager [phone] (M-F; 7a - 4p)  Please contact [facility] for night-coverage after hours (4p -7a ) and weekends on [email or web address]

## 2017-12-29 DIAGNOSIS — I428 Other cardiomyopathies: Secondary | ICD-10-CM

## 2017-12-29 LAB — BASIC METABOLIC PANEL
ANION GAP: 10 (ref 5–15)
BUN: 16 mg/dL (ref 6–20)
CALCIUM: 9.2 mg/dL (ref 8.9–10.3)
CO2: 28 mmol/L (ref 22–32)
Chloride: 96 mmol/L — ABNORMAL LOW (ref 101–111)
Creatinine, Ser: 1.25 mg/dL — ABNORMAL HIGH (ref 0.61–1.24)
GFR calc Af Amer: >60 mL/min (ref >60)
GFR calc non Af Amer: >60 mL/min (ref >60)
GLUCOSE: 105 mg/dL — AB (ref 65–99)
POTASSIUM: 4.9 mmol/L (ref 3.5–5.1)
Sodium: 134 mmol/L — ABNORMAL LOW (ref 135–145)

## 2017-12-29 LAB — SURGICAL PCR SCREEN
MRSA, PCR: NEGATIVE
STAPHYLOCOCCUS AUREUS: NEGATIVE

## 2017-12-29 MED ORDER — CHLORHEXIDINE GLUCONATE 4 % EX LIQD
60.0000 mL | Freq: Once | CUTANEOUS | Status: AC
  Administered 2017-12-29: 4 via TOPICAL

## 2017-12-29 MED ORDER — SODIUM CHLORIDE 0.9 % IV SOLN
INTRAVENOUS | Status: DC
  Administered 2017-12-30 (×2): 50 mL/h via INTRAVENOUS

## 2017-12-29 MED ORDER — CHLORHEXIDINE GLUCONATE 4 % EX LIQD
60.0000 mL | Freq: Once | CUTANEOUS | Status: AC
  Administered 2017-12-30: 4 via TOPICAL

## 2017-12-29 MED ORDER — CHLORHEXIDINE GLUCONATE 4 % EX LIQD
CUTANEOUS | Status: AC
  Administered 2017-12-29: 23:00:00
  Filled 2017-12-29: qty 15

## 2017-12-29 MED ORDER — SODIUM CHLORIDE 0.45 % IV SOLN: INTRAVENOUS | Status: DC

## 2017-12-29 NOTE — Progress Notes (Signed)
Progress Note   Subjective   Doing well today, the patient denies CP or SOB.  No new concerns  Inpatient Medications    Scheduled Meds: . amiodarone  200 mg Oral BID  . aspirin EC  81 mg Oral Daily  . atorvastatin  40 mg Oral q1800  . clopidogrel  75 mg Oral Daily  . digoxin  0.125 mg Oral Daily  . furosemide  40 mg Oral Daily  . heparin  5,000 Units Subcutaneous Q8H  . losartan  25 mg Oral BID  . mouth rinse  15 mL Mouth Rinse BID  . sodium chloride flush  10-40 mL Intracatheter Q12H  . spironolactone  25 mg Oral Daily   Continuous Infusions:  PRN Meds: acetaminophen, benzonatate, nitroGLYCERIN, ondansetron (ZOFRAN) IV, sodium chloride, sodium chloride flush   Vital Signs    Vitals:   12/28/17 0958 12/28/17 1218 12/28/17 2044 12/29/17 0532  BP:  108/90 124/78 95/77  Pulse: 64 (!) 107 72 85  Resp:  20 18 18   Temp:  97.7 F (36.5 C) 97.9 F (36.6 C) 98.8 F (37.1 C)  TempSrc:  Oral Oral Oral  SpO2:  100% 95% 97%  Weight:    225 lb 8 oz (102.3 kg)  Height:        Intake/Output Summary (Last 24 hours) at 12/29/2017 1002 Last data filed at 12/29/2017 0925 Gross per 24 hour  Intake 1080 ml  Output 1500 ml  Net -420 ml   Filed Weights   12/27/17 1528 12/28/17 0537 12/29/17 0532  Weight: 232 lb 9.4 oz (105.5 kg) 227 lb 8 oz (103.2 kg) 225 lb 8 oz (102.3 kg)    Telemetry    Sinus rhythm, very frequent nonsustained VT and PVCs - Personally Reviewed  Physical Exam   GEN- The patient is much better appearing, alert and oriented x 3 today.   Head- normocephalic, atraumatic Eyes-  Sclera clear, conjunctiva pink Ears- hearing intact Oropharynx- clear Neck- supple, Lungs- Clear to ausculation bilaterally, normal work of breathing Heart- Regular rate and rhythm with ectopy GI- soft, NT, ND, + BS Extremities- no clubbing, cyanosis, or edema  MS- no significant deformity or atrophy Skin- no rash or lesion Psych- euthymic mood, full affect Neuro- strength  and sensation are intact   Labs    Chemistry Recent Labs  Lab 12/23/17 0034  12/27/17 0411 12/28/17 0422 12/29/17 0431  NA 130*   < > 137 138 134*  K 3.4*   < > 4.1 4.6 4.9  CL 92*   < > 95* 98* 96*  CO2 26   < > 33* 31 28  GLUCOSE 125*   < > 106* 113* 105*  BUN 44*   < > 22* 16 16  CREATININE 1.86*   < > 1.29* 1.20 1.25*  CALCIUM 8.5*   < > 8.8* 9.2 9.2  PROT 6.5  --  6.2*  --   --   ALBUMIN 3.3*  --  2.6*  --   --   AST 84*  --  42*  --   --   ALT 56  --  37  --   --   ALKPHOS 50  --  74  --   --   BILITOT 0.8  --  1.1  --   --   GFRNONAA 40*   < > >60 >60 >60  GFRAA 46*   < > >60 >60 >60  ANIONGAP 12   < > 9 9 10    < > =  values in this interval not displayed.     Hematology Recent Labs  Lab 12/23/17 0034 12/23/17 1919  WBC 5.7 6.2  RBC 4.97 4.83  HGB 14.6 14.3  HCT 45.1 42.8  MCV 90.9 88.6  MCH 29.4 29.6  MCHC 32.3 33.4  RDW 15.5* 15.0  PLT 118* 131*    Cardiac Enzymes Recent Labs  Lab 12/23/17 0034 12/23/17 0706  TROPONINI 0.09* 0.15*   No results for input(s): TROPIPOC in the last 168 hours.      Assessment & Plan    1.  S/p VF arrest/ dilated nonischemic CM/ VT Clinically much improved s/p arrest I would advise ICD at this time He continues to have nonsustained VT despite amiodarone.  PVCs appear to arise from the inferior left ventricle.  I suspect that PVCs are result of cardiomyopathy.  I do not advise ablation as our primary strategy. The patient has a dilated nonischemic CM (EF 10%), NYHA Class III CHF, and recent VF arrest.  At this time, he meets criteria for ICD implantation for secondary prevention of sudden death.  Given QRS of 130 msec, I am not convinced that he would benefit from CRT.  I have had a thorough discussion with the patient reviewing options.  The patient has had opportunities to ask questions and have them answered. The patient and I have decided together through a shared decision making process to ICD at this time.     Risks, benefits, alternatives to ICD implantation were discussed in detail with the patient today. The patient understands that the risks include but are not limited to bleeding, infection, pneumothorax, perforation, tamponade, vascular damage, renal failure, MI, stroke, death, inappropriate shocks, and lead dislodgement and wishes to proceed.  We will therefore schedule device implantation with Dr Caryl Comes for tomorrow.  Orders placed   Thompson Grayer MD, [facility] 12/29/2017 10:02 AM

## 2017-12-29 NOTE — Progress Notes (Signed)
Left forearm Iv removed painful applied heat and replaced Iv 20G in right forearm. Plan for procedure ICD place tomorrow afternoon

## 2017-12-29 NOTE — Progress Notes (Signed)
Patient ID: Stephen [name], male   DOB: November 02, 1966, 51 y.o.   MRN: [id number]    Advanced Heart Failure Rounding Note   Subjective:    Transferred from [facility] 12/24 with cardiogenic shock in setting of the flu and previous NICM EF 20%  Swan placed. Started on NE and milrinone. Output improved but developed multiple episodes of VT. Weaned off milrinone and norepinephrine.   No VT, still with PVCs.   Walked with cardiac rehab yesterday, did well.  No dyspnea walking in hall.    Cardiac MRI: severe LV dilation, EF 16%, mildly decreased RV systolic function, small area of mid-wall LGE in the mid inferolateral wall.   Objective:   Weight Range:  Vital Signs:   Temp:  [97.7 F (36.5 C)-98.8 F (37.1 C)] 98.8 F (37.1 C) (12/30 0532) Pulse Rate:  [64-107] 85 (12/30 0532) Resp:  [18-20] 18 (12/30 0532) BP: (95-124)/(77-90) 95/77 (12/30 0532) SpO2:  [95 %-100 %] 97 % (12/30 0532) Weight:  [225 lb 8 oz (102.3 kg)] 225 lb 8 oz (102.3 kg) (12/30 0532) Last BM Date: 12/28/17  Weight change: Filed Weights   12/27/17 1528 12/28/17 0537 12/29/17 0532  Weight: 232 lb 9.4 oz (105.5 kg) 227 lb 8 oz (103.2 kg) 225 lb 8 oz (102.3 kg)    Intake/Output:   Intake/Output Summary (Last 24 hours) at 12/29/2017 0937 Last data filed at 12/29/2017 0925 Gross per 24 hour  Intake 1080 ml  Output 1500 ml  Net -420 ml    Physical Exam: General: NAD Neck: No JVD, no thyromegaly or thyroid nodule.  Lungs: Clear to auscultation bilaterally with normal respiratory effort. CV: Nondisplaced PMI.  Heart regular S1/S2, no S3/S4, no murmur.  No peripheral edema.  No carotid bruit.  Normal pedal pulses.  Abdomen: Soft, nontender, no hepatosplenomegaly, no distention.  Skin: Intact without lesions or rashes.  Neurologic: Alert and oriented x 3.  Psych: Normal affect. Extremities: No clubbing or cyanosis.  HEENT: Normal.    Telemetry: NSR with frequent, personally reviewed  Labs: Basic Metabolic  Panel: Recent Labs  Lab 12/23/17 0034 12/23/17 0706  12/25/17 0338 12/26/17 0344 12/27/17 0411 12/28/17 0422 12/29/17 0431  NA 130* 127*   < > 135 134* 137 138 134*  K 3.4* 4.8   < > 3.2* 3.9 4.1 4.6 4.9  CL 92* 94*   < > 95* 90* 95* 98* 96*  CO2 26 22   < > 30 32 33* 31 28  GLUCOSE 125* 145*   < > 100* 167* 106* 113* 105*  BUN 44* 47*   < > 44* 30* 22* 16 16  CREATININE 1.86* 2.22*   < > 1.67* 1.40* 1.29* 1.20 1.25*  CALCIUM 8.5* 8.6*   < > 8.0* 8.5* 8.8* 9.2 9.2  MG 2.0 2.3  --  1.8  --   --  2.2  --   PHOS 4.2 4.7*  --   --   --   --   --   --    < > = values in this interval not displayed.    Liver Function Tests: Recent Labs  Lab 12/23/17 0034 12/27/17 0411  AST 84* 42*  ALT 56 37  ALKPHOS 50 74  BILITOT 0.8 1.1  PROT 6.5 6.2*  ALBUMIN 3.3* 2.6*   No results for input(s): LIPASE, AMYLASE in the last 168 hours. No results for input(s): AMMONIA in the last 168 hours.  CBC: Recent Labs  Lab 12/23/17 0034 12/23/17 1919  WBC 5.7 6.2  HGB 14.6 14.3  HCT 45.1 42.8  MCV 90.9 88.6  PLT 118* 131*    Cardiac Enzymes: Recent Labs  Lab 12/23/17 0034 12/23/17 0706  TROPONINI 0.09* 0.15*    BNP: BNP (last 3 results) Recent Labs    10/08/17 1710 12/23/17 0034 12/24/17 0303  BNP 1,259.0* 1,017.0* 1,335.4*   ProBNP (last 3 results) No results for input(s): PROBNP in the last 8760 hours.  Other results:  Imaging: No results found.  Medications:    Scheduled Medications: . amiodarone  200 mg Oral BID  . aspirin EC  81 mg Oral Daily  . atorvastatin  40 mg Oral q1800  . clopidogrel  75 mg Oral Daily  . digoxin  0.125 mg Oral Daily  . furosemide  40 mg Oral Daily  . heparin  5,000 Units Subcutaneous Q8H  . losartan  25 mg Oral BID  . mouth rinse  15 mL Mouth Rinse BID  . sodium chloride flush  10-40 mL Intracatheter Q12H  . spironolactone  25 mg Oral Daily    Infusions:   PRN Medications: acetaminophen, benzonatate, nitroGLYCERIN,  ondansetron (ZOFRAN) IV, sodium chloride, sodium chloride flush  Assessment:   Stephen [name] is a 51 y/o male with longstanding NICM and systolic HF transferred from [facility] for management of worsening HF, Shock, Influenzae A and VF arrest.  Plan/Discussion:    1. Acute on chronic systolic HF with cardiogenic shock: Due to NiCM. EF 20%.  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  Cardiac MRI with EF 16%, severe LV dilation and small area of mid-wall LGE in the mid inferolateral wall.  Possible prior myocarditis as cause of cardiomyopathy with PVCs contributing.  Consider cardiac sarcoidosis with LGE pattern but review of 9/18 CTA chest did not show signs of pulmonary sarcoidosis or hilar adenopathy.  Now off milrinone.  Volume status looks optimized on po Lasix.  - Continue po Lasix.  - If stable BP tomorrow, can add low dose Coreg 3.125 mg bid.  - Continue losartan 25 mg bid.   - Continue spironolactone 25 daily, watch K as upper normal today.  - Continue digoxin.  2. VF arrest on 12/24 at [facility]: S/p defibrillation x 1. Multiple runs VT overnight into 12/24/17. Improved with decreasing norepi and starting IV amio - Continue po amiodarone, possible addition of Coreg tomorrow.   - Plan for ICD on Monday. ECG showed IVCD with QRS in 130s range, probably would not benefit significantly from CRT.  3. Acute on chronic renal failure stage III: Creatinine 1.25 today, back to baseline.  4. Influenza A: Tamiflu completed 12/26. Afebrile. No change.  5. 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  - Continue amiodarone 200 mg bid.  Likely will add Coreg tomorrow.   Length of Stay: 6  Loralie Champagne, MD  12/29/2017, 9:37 AM  Advanced Heart Failure Team Pager [phone] (M-F; 7a - 4p)  Please contact [facility] for night-coverage after hours (4p -7a ) and weekends on [email or web address]

## 2017-12-30 ENCOUNTER — Encounter (HOSPITAL_COMMUNITY)
Admission: AD | Disposition: A | Discharge status: Home / Self Care | Payer: Self-pay | Source: Other Acute Inpatient Hospital | Attending: Cardiovascular Disease

## 2017-12-30 ENCOUNTER — Encounter (HOSPITAL_COMMUNITY): Payer: Self-pay | Admitting: Internal Medicine

## 2017-12-30 DIAGNOSIS — I4901 Ventricular fibrillation: Secondary | ICD-10-CM

## 2017-12-30 DIAGNOSIS — I428 Other cardiomyopathies: Secondary | ICD-10-CM

## 2017-12-30 DIAGNOSIS — I469 Cardiac arrest, cause unspecified: Secondary | ICD-10-CM

## 2017-12-30 HISTORY — PX: ICD IMPLANT: EP1208

## 2017-12-30 LAB — CBC WITH DIFFERENTIAL/PLATELET
BASOS ABS: 0 10*3/uL (ref 0.0–0.1)
BASOS PCT: 0 %
EOS ABS: 0.2 10*3/uL (ref 0.0–0.7)
EOS PCT: 1 %
HCT: 47.2 % (ref 39.0–52.0)
Hemoglobin: 15.6 g/dL (ref 13.0–17.0)
LYMPHS PCT: 28 %
Lymphs Abs: 3.4 10*3/uL (ref 0.7–4.0)
MCH: 29.8 pg (ref 26.0–34.0)
MCHC: 33.1 g/dL (ref 30.0–36.0)
MCV: 90.1 fL (ref 78.0–100.0)
MONO ABS: 1.2 10*3/uL — AB (ref 0.1–1.0)
Monocytes Relative: 10 %
Neutro Abs: 7.4 10*3/uL (ref 1.7–7.7)
Neutrophils Relative %: 61 %
PLATELETS: 325 10*3/uL (ref 150–400)
RBC: 5.24 MIL/uL (ref 4.22–5.81)
RDW: 15.5 % (ref 11.5–15.5)
WBC: 12.2 10*3/uL — AB (ref 4.0–10.5)

## 2017-12-30 LAB — BASIC METABOLIC PANEL
Anion gap: 10 (ref 5–15)
BUN: 20 mg/dL (ref 6–20)
CALCIUM: 9.1 mg/dL (ref 8.9–10.3)
CO2: 28 mmol/L (ref 22–32)
CREATININE: 1.25 mg/dL — AB (ref 0.61–1.24)
Chloride: 99 mmol/L — ABNORMAL LOW (ref 101–111)
GFR calc Af Amer: >60 mL/min (ref >60)
GLUCOSE: 101 mg/dL — AB (ref 65–99)
POTASSIUM: 4.6 mmol/L (ref 3.5–5.1)
SODIUM: 137 mmol/L (ref 135–145)

## 2017-12-30 SURGERY — ICD IMPLANT

## 2017-12-30 MED ORDER — HEPARIN (PORCINE) IN NACL 2-0.9 UNIT/ML-% IJ SOLN
INTRAMUSCULAR | Status: AC
  Filled 2017-12-30: qty 500

## 2017-12-30 MED ORDER — HEPARIN (PORCINE) IN NACL 2-0.9 UNIT/ML-% IJ SOLN
INTRAMUSCULAR | Status: AC | PRN
  Administered 2017-12-30: 500 mL

## 2017-12-30 MED ORDER — ONDANSETRON HCL 4 MG/2ML IJ SOLN: 4.0000 mg | Freq: Four times a day (QID) | INTRAMUSCULAR | Status: DC | PRN

## 2017-12-30 MED ORDER — SODIUM CHLORIDE 0.9 % IR SOLN
Status: AC
  Filled 2017-12-30: qty 2

## 2017-12-30 MED ORDER — CARVEDILOL 3.125 MG PO TABS
3.1250 mg | ORAL_TABLET | Freq: Two times a day (BID) | ORAL | Status: DC
  Administered 2017-12-30 – 2017-12-31 (×3): 3.125 mg via ORAL
  Filled 2017-12-30 (×3): qty 1

## 2017-12-30 MED ORDER — CEFAZOLIN SODIUM-DEXTROSE 2-4 GM/100ML-% IV SOLN
2.0000 g | INTRAVENOUS | Status: AC
  Administered 2017-12-30: 2 g via INTRAVENOUS
  Filled 2017-12-30: qty 100

## 2017-12-30 MED ORDER — FENTANYL CITRATE (PF) 100 MCG/2ML IJ SOLN
INTRAMUSCULAR | Status: AC
  Filled 2017-12-30: qty 2

## 2017-12-30 MED ORDER — MIDAZOLAM HCL 5 MG/5ML IJ SOLN
INTRAMUSCULAR | Status: DC | PRN
  Administered 2017-12-30: 2 mg via INTRAVENOUS
  Administered 2017-12-30 (×2): 1 mg via INTRAVENOUS

## 2017-12-30 MED ORDER — CEFAZOLIN SODIUM-DEXTROSE 1-4 GM/50ML-% IV SOLN
1.0000 g | Freq: Four times a day (QID) | INTRAVENOUS | Status: AC
  Administered 2017-12-30 – 2017-12-31 (×3): 1 g via INTRAVENOUS
  Filled 2017-12-30 (×3): qty 50

## 2017-12-30 MED ORDER — LIDOCAINE HCL (PF) 1 % IJ SOLN
INTRAMUSCULAR | Status: DC | PRN
  Administered 2017-12-30: 60 mL

## 2017-12-30 MED ORDER — ACETAMINOPHEN 325 MG PO TABS
325.0000 mg | ORAL_TABLET | ORAL | Status: DC | PRN
  Administered 2017-12-30: 650 mg via ORAL
  Filled 2017-12-30: qty 2

## 2017-12-30 MED ORDER — SODIUM CHLORIDE 0.9 % IR SOLN
80.0000 mg | Status: AC
  Administered 2017-12-30: 80 mg
  Filled 2017-12-30: qty 2

## 2017-12-30 MED ORDER — MIDAZOLAM HCL 5 MG/5ML IJ SOLN
INTRAMUSCULAR | Status: AC
  Filled 2017-12-30: qty 5

## 2017-12-30 MED ORDER — FENTANYL CITRATE (PF) 100 MCG/2ML IJ SOLN
INTRAMUSCULAR | Status: DC | PRN
  Administered 2017-12-30 (×3): 25 ug via INTRAVENOUS

## 2017-12-30 MED ORDER — LIDOCAINE HCL (PF) 1 % IJ SOLN
INTRAMUSCULAR | Status: AC
  Filled 2017-12-30: qty 60

## 2017-12-30 MED ORDER — CEFAZOLIN SODIUM-DEXTROSE 2-4 GM/100ML-% IV SOLN
INTRAVENOUS | Status: AC
  Filled 2017-12-30: qty 100

## 2017-12-30 MED ORDER — SODIUM CHLORIDE 0.9 % IV SOLN: INTRAVENOUS | Status: AC

## 2017-12-30 SURGICAL SUPPLY — 8 items
CABLE SURGICAL S-101-97-12 (CABLE) ×2 IMPLANT
HEMOSTAT SURGICEL 2X4 FIBR (HEMOSTASIS) ×2 IMPLANT
ICD VISIA MRI VR DVFB1D4 (ICD Generator) ×1 IMPLANT
LEAD SPRINT QUAT SEC 6935M-62 (Lead) ×2 IMPLANT
PAD DEFIB LIFELINK (PAD) ×2 IMPLANT
SHEATH CLASSIC 9F (SHEATH) ×2 IMPLANT
TRAY PACEMAKER INSERTION (PACKS) ×2 IMPLANT
VISIA MRI VR DVFB1D4 (ICD Generator) ×2 IMPLANT

## 2017-12-30 NOTE — Progress Notes (Addendum)
Electrophysiology Rounding Note  Patient Name: Stephen [name] Date of Encounter: 12/30/2017  Primary Cardiologist: Fletcher Anon Electrophysiologist: Caryl Comes   Subjective   The patient is doing well today.  At this time, the patient denies chest pain, shortness of breath, or any new concerns. He has ambulated independently. No recent fevers/chills.   Inpatient Medications    Scheduled Meds: . amiodarone  200 mg Oral BID  . aspirin EC  81 mg Oral Daily  . atorvastatin  40 mg Oral q1800  . chlorhexidine  60 mL Topical Once  . clopidogrel  75 mg Oral Daily  . digoxin  0.125 mg Oral Daily  . furosemide  40 mg Oral Daily  . gentamicin irrigation  80 mg Irrigation On Call  . losartan  25 mg Oral BID  . mouth rinse  15 mL Mouth Rinse BID  . sodium chloride flush  10-40 mL Intracatheter Q12H  . spironolactone  25 mg Oral Daily   Continuous Infusions: . sodium chloride    . sodium chloride    .  ceFAZolin (ANCEF) IV     PRN Meds: acetaminophen, benzonatate, nitroGLYCERIN, ondansetron (ZOFRAN) IV, sodium chloride, sodium chloride flush   Vital Signs    Vitals:   12/29/17 1203 12/29/17 2025 12/30/17 0606 12/30/17 0608  BP: (!) 117/100 106/70 101/65   Pulse: 82 75 71   Resp: 20  14   Temp: 98.5 F (36.9 C) 97.7 F (36.5 C) 98.2 F (36.8 C)   TempSrc: Oral Oral Oral   SpO2: 98% 99% 96%   Weight:    224 lb 8 oz (101.8 kg)  Height:        Intake/Output Summary (Last 24 hours) at 12/30/2017 0716 Last data filed at 12/30/2017 0606 Gross per 24 hour  Intake 1320 ml  Output 650 ml  Net 670 ml   Filed Weights   12/28/17 0537 12/29/17 0532 12/30/17 0608  Weight: 227 lb 8 oz (103.2 kg) 225 lb 8 oz (102.3 kg) 224 lb 8 oz (101.8 kg)    Physical Exam    GEN- The patient is well appearing, alert and oriented x 3 today.   Head- normocephalic, atraumatic Eyes-  Sclera clear, conjunctiva pink Ears- hearing intact Oropharynx- clear Neck- supple Lungs- Clear to ausculation  bilaterally, normal work of breathing Heart- Regular rate and rhythm GI- soft, NT, ND, + BS Extremities- no clubbing, cyanosis, or edema Skin- no rash or lesion Psych- euthymic mood, full affect Neuro- strength and sensation are intact  Labs    CBC Recent Labs    12/30/17 0550  WBC 12.2*  NEUTROABS 7.4  HGB 15.6  HCT 47.2  MCV 90.1  PLT 967   Basic Metabolic Panel Recent Labs    12/28/17 0422 12/29/17 0431  NA 138 134*  K 4.6 4.9  CL 98* 96*  CO2 31 28  GLUCOSE 113* 105*  BUN 16 16  CREATININE 1.20 1.25*  CALCIUM 9.2 9.2  MG 2.2  --     Telemetry    Sinus rhythm, PVC's (personally reviewed)  Radiology    No results found.   Patient Profile     Stephen [name] is a 51 y.o. male with a past medical history significant for NICM.  He was transferred from [facility] with VF arrest  Assessment & Plan    1.  VF arrest/NICM Significantly improved He is willing to proceed with ICD for secondary prevention Scheduled for today No driving x6 months Keep K>3.9, Mg>1.8  Dr Caryl Comes to see later today   Signed, Chanetta Marshall, NP  12/30/2017, 7:16 AM   NICM  VF arrest   CHF chronic systolic  PVCs freq  Sleep disordered breathing    Pt with aborted cardiac arrest in the context of NICM and Complex ventricular ectopy further complicated by cardiogenic shock, now off pressors  Now needs ICD for secondary prevention   euvolemic continue current meds  Continue amio for PVCs   Have reviewed the potential benefits and risks of ICD implantation including but not limited to death, perforation of heart or lung, lead dislodgement, infection,  device malfunction and inappropriate shocks.  The patient  express understanding  and are willing to proceed.

## 2017-12-30 NOTE — Progress Notes (Signed)
Infectious control nurse called this morning around  8am and d/c'd the droplet precautions.

## 2017-12-30 NOTE — Progress Notes (Signed)
ICD Criteria  Current LVEF:25%. Within 12 months prior to implant: Yes   Heart failure history: Yes, Class III  Cardiomyopathy history: Yes, Non-Ischemic Cardiomyopathy.  Atrial Fibrillation/Atrial Flutter: No.  Ventricular tachycardia history: Yes, No hemodynamic instability. VT Type: Sustained Ventricular Tachycardia - Monomorphic.  Cardiac arrest history: Yes, Ventricular Fibrillation.  History of syndromes with risk of sudden death: No.  Previous ICD: No.  Current ICD indication: Secondary  PPM indication: No.   Class I or II Bradycardia indication present: No  Beta Blocker therapy for 3 or more months: Yes, prescribed.   Ace Inhibitor/ARB therapy for 3 or more months: Yes, prescribed.

## 2017-12-30 NOTE — Discharge Instructions (Signed)
° ° °  Supplemental Discharge Instructions for  Pacemaker/Defibrillator Patients  Activity No heavy lifting or vigorous activity with your left/right arm for 6 to 8 weeks.  Do not raise your left/right arm above your head for one week.  Gradually raise your affected arm as drawn below.            __      01/04/18                     01/05/18                        01/06/18                      01/07/18  NO DRIVING for   6 months  WOUND CARE - Keep the wound area clean and dry.    - The tape/steri-strips on your wound will fall off; do not pull them off.  No bandage is needed on the site.  DO  NOT apply any creams, oils, or ointments to the wound area. - If you notice any drainage or discharge from the wound, any swelling or bruising at the site, or you develop a fever > 101? F after you are discharged home, call the office at once.  Special Instructions - You are still able to use cellular telephones; use the ear opposite the side where you have your pacemaker/defibrillator.  Avoid carrying your cellular phone near your device. - When traveling through airports, show security personnel your identification card to avoid being screened in the metal detectors.  Ask the security personnel to use the hand wand. - Avoid arc welding equipment, MRI testing (magnetic resonance imaging), TENS units (transcutaneous nerve stimulators).  Call the office for questions about other devices. - Avoid electrical appliances that are in poor condition or are not properly grounded. - Microwave ovens are safe to be near or to operate.  Additional information for defibrillator patients should your device go off: - If your device goes off ONCE and you feel fine afterward, notify the device clinic nurses. - If your device goes off ONCE and you do not feel well afterward, call 911. - If your device goes off TWICE, call 911. - If your device goes off THREE times in one day, call 911.  DO NOT DRIVE YOURSELF OR A FAMILY  MEMBER WITH A DEFIBRILLATOR TO THE HOSPITAL--CALL 911.

## 2017-12-30 NOTE — Progress Notes (Addendum)
Patient ID: Stephen [name], male   DOB: [birth year], 51 y.o.   MRN: [id number]    Advanced Heart Failure Rounding Note   Subjective:    Transferred from [facility] 12/24 with cardiogenic shock in setting of the flu and previous NICM EF 20%  Swan placed. Started on NE and milrinone. Output improved but developed multiple episodes of VT. Weaned off milrinone and norepinephrine.   No VT, still with PVCs.   Walked with cardiac rehab over weekend. Did well.   Feeling good this am. Plan for ICD this afternoon. Denies SOB or CP.   Cardiac MRI: severe LV dilation, EF 16%, mildly decreased RV systolic function, small area of mid-wall LGE in the mid inferolateral wall.   Objective:   Weight Range:  Vital Signs:   Temp:  [97.7 F (36.5 C)-98.5 F (36.9 C)] 98.2 F (36.8 C) (12/31 0606) Pulse Rate:  [71-82] 71 (12/31 0606) Resp:  [14-20] 14 (12/31 0606) BP: (101-117)/(65-100) 101/65 (12/31 0606) SpO2:  [96 %-99 %] 96 % (12/31 0606) Weight:  [224 lb 8 oz (101.8 kg)] 224 lb 8 oz (101.8 kg) (12/31 0608) Last BM Date: 12/28/17  Weight change: Filed Weights   12/28/17 0537 12/29/17 0532 12/30/17 1941  Weight: 227 lb 8 oz (103.2 kg) 225 lb 8 oz (102.3 kg) 224 lb 8 oz (101.8 kg)    Intake/Output:   Intake/Output Summary (Last 24 hours) at 12/30/2017 0814 Last data filed at 12/30/2017 0606 Gross per 24 hour  Intake 1320 ml  Output 650 ml  Net 670 ml    Physical Exam: General: Well appearing. No resp difficulty. HEENT: Normal Neck: Supple. JVP 5-6. Carotids 2+ bilat; no bruits. No thyromegaly or nodule noted. Cor: PMI nondisplaced. RRR, No M/G/R noted Lungs: CTAB, normal effort. Abdomen: Soft, non-tender, non-distended, no HSM. No bruits or masses. +BS  Extremities: No cyanosis, clubbing, or rash. R and LLE no edema.  Neuro: Alert & orientedx3, cranial nerves grossly intact. moves all 4 extremities w/o difficulty. Affect pleasant   Telemetry: NSR with PVCs, personally reviewed.    Labs: Basic Metabolic Panel: Recent Labs  Lab 12/25/17 0338 12/26/17 0344 12/27/17 0411 12/28/17 0422 12/29/17 0431 12/30/17 0550  NA 135 134* 137 138 134* 137  K 3.2* 3.9 4.1 4.6 4.9 4.6  CL 95* 90* 95* 98* 96* 99*  CO2 30 32 33* 31 28 28   GLUCOSE 100* 167* 106* 113* 105* 101*  BUN 44* 30* 22* 16 16 20   CREATININE 1.67* 1.40* 1.29* 1.20 1.25* 1.25*  CALCIUM 8.0* 8.5* 8.8* 9.2 9.2 9.1  MG 1.8  --   --  2.2  --   --     Liver Function Tests: Recent Labs  Lab 12/27/17 0411  AST 42*  ALT 37  ALKPHOS 74  BILITOT 1.1  PROT 6.2*  ALBUMIN 2.6*   No results for input(s): LIPASE, AMYLASE in the last 168 hours. No results for input(s): AMMONIA in the last 168 hours.  CBC: Recent Labs  Lab 12/23/17 1919 12/30/17 0550  WBC 6.2 12.2*  NEUTROABS  --  7.4  HGB 14.3 15.6  HCT 42.8 47.2  MCV 88.6 90.1  PLT 131* 325    Cardiac Enzymes: No results for input(s): CKTOTAL, CKMB, CKMBINDEX, TROPONINI in the last 168 hours.  BNP: BNP (last 3 results) Recent Labs    10/08/17 1710 12/23/17 0034 12/24/17 0303  BNP 1,259.0* 1,017.0* 1,335.4*   ProBNP (last 3 results) No results for input(s): PROBNP in the last 8760  hours.  Other results:  Imaging: No results found.  Medications:    Scheduled Medications: . amiodarone  200 mg Oral BID  . aspirin EC  81 mg Oral Daily  . atorvastatin  40 mg Oral q1800  . chlorhexidine  60 mL Topical Once  . clopidogrel  75 mg Oral Daily  . digoxin  0.125 mg Oral Daily  . furosemide  40 mg Oral Daily  . gentamicin irrigation  80 mg Irrigation On Call  . losartan  25 mg Oral BID  . mouth rinse  15 mL Mouth Rinse BID  . sodium chloride flush  10-40 mL Intracatheter Q12H  . spironolactone  25 mg Oral Daily    Infusions: . sodium chloride    . sodium chloride    .  ceFAZolin (ANCEF) IV      PRN Medications: acetaminophen, benzonatate, nitroGLYCERIN, ondansetron (ZOFRAN) IV, sodium chloride, sodium chloride  flush  Assessment:   Stephen [name] is a 51 y/o male with longstanding NICM and systolic HF transferred from [facility] for management of worsening HF, Shock, Influenzae A and VF arrest.  Plan/Discussion:    1. Acute on chronic systolic HF with cardiogenic shock: Due to NiCM. EF 20%.  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  Cardiac MRI with EF 16%, severe LV dilation and small area of mid-wall LGE in the mid inferolateral wall.  Possible prior myocarditis as cause of cardiomyopathy with PVCs contributing.  Consider cardiac sarcoidosis with LGE pattern but review of 9/18 CTA chest did not show signs of pulmonary sarcoidosis or hilar adenopathy.  Now off milrinone. - Volume status stable on po lasix 40 mg daily.  - Add low dose Coreg 3.125 mg bid today.  - Continue losartan 25 mg bid.   - Continue spironolactone 25 daily, watch K as upper normal today.  - Continue digoxin.  2. VF arrest on 12/24 at [facility]: S/p defibrillation x 1. Multiple runs VT overnight into 12/24/17. Improved with decreasing norepi and starting IV amio - Continue po amiodarone, adding Coreg.  - Plan for ICD on Today. ECG showed IVCD with QRS in 130s range, probably would not benefit significantly from CRT.  - No driving x 6 months.  3. Acute on chronic renal failure stage III: - Stable at baseline today.  4. Influenza A: Tamiflu completed 12/26.  - Afebrile. No change.  5. Frequent PVCs:  - May be playing a role in his cardiomyopathy.  - Continue amiodarone 200 mg bid.   - Add coreg 3.125 mg BID today.   Likely home tomorrow.   Length of Stay: [place], [place]  12/30/2017, 8:14 AM  Advanced Heart Failure Team Pager [phone] (M-F; 7a - 4p)  Please contact [facility] for night-coverage after hours (4p -7a ) and weekends on [email or web address]  Patient seen with PA, agree with the above note.  He is stable, plan for ICD today.  Euvolemic on exam.  Can add Coreg 3.125 mg bid.  Will likely go home tomorrow.    Loralie Champagne 12/30/2017 9:17 AM

## 2017-12-31 ENCOUNTER — Inpatient Hospital Stay (HOSPITAL_COMMUNITY): Payer: Commercial Managed Care - PPO

## 2017-12-31 ENCOUNTER — Other Ambulatory Visit: Payer: Self-pay | Admitting: Cardiology

## 2017-12-31 DIAGNOSIS — I5043 Acute on chronic combined systolic (congestive) and diastolic (congestive) heart failure: Secondary | ICD-10-CM

## 2017-12-31 LAB — BASIC METABOLIC PANEL
Anion gap: 9 (ref 5–15)
BUN: 21 mg/dL — ABNORMAL HIGH (ref 6–20)
CHLORIDE: 100 mmol/L — AB (ref 101–111)
CO2: 28 mmol/L (ref 22–32)
CREATININE: 1.3 mg/dL — AB (ref 0.61–1.24)
Calcium: 9 mg/dL (ref 8.9–10.3)
GFR calc non Af Amer: >60 mL/min (ref >60)
GLUCOSE: 106 mg/dL — AB (ref 65–99)
Potassium: 5 mmol/L (ref 3.5–5.1)
Sodium: 137 mmol/L (ref 135–145)

## 2017-12-31 LAB — CBC WITH DIFFERENTIAL/PLATELET
BASOS PCT: 0 %
Basophils Absolute: 0 10*3/uL (ref 0.0–0.1)
EOS PCT: 1 %
Eosinophils Absolute: 0.1 10*3/uL (ref 0.0–0.7)
HCT: 47 % (ref 39.0–52.0)
HEMOGLOBIN: 15.4 g/dL (ref 13.0–17.0)
LYMPHS PCT: 22 %
Lymphs Abs: 2.2 10*3/uL (ref 0.7–4.0)
MCH: 29.7 pg (ref 26.0–34.0)
MCHC: 32.8 g/dL (ref 30.0–36.0)
MCV: 90.6 fL (ref 78.0–100.0)
MONO ABS: 1.4 10*3/uL — AB (ref 0.1–1.0)
Monocytes Relative: 14 %
NEUTROS PCT: 63 %
Neutro Abs: 6.4 10*3/uL (ref 1.7–7.7)
Platelets: 348 10*3/uL (ref 150–400)
RBC: 5.19 MIL/uL (ref 4.22–5.81)
RDW: 15.7 % — ABNORMAL HIGH (ref 11.5–15.5)
WBC: 10.1 10*3/uL (ref 4.0–10.5)

## 2017-12-31 LAB — MAGNESIUM: Magnesium: 2.2 mg/dL (ref 1.7–2.4)

## 2017-12-31 MED ORDER — AMIODARONE HCL 200 MG PO TABS: 200.0000 mg | ORAL_TABLET | Freq: Every day | ORAL | 5 refills | Status: DC

## 2017-12-31 MED ORDER — ASPIRIN 81 MG PO TBEC: 81.0000 mg | DELAYED_RELEASE_TABLET | Freq: Every day | ORAL | 3 refills | Status: DC

## 2017-12-31 MED ORDER — FUROSEMIDE 40 MG PO TABS: 40.0000 mg | ORAL_TABLET | Freq: Every day | ORAL | 5 refills | Status: DC

## 2017-12-31 MED ORDER — CARVEDILOL 3.125 MG PO TABS: 3.1250 mg | ORAL_TABLET | Freq: Two times a day (BID) | ORAL | 5 refills | Status: DC

## 2017-12-31 MED ORDER — DIGOXIN 125 MCG PO TABS: 0.1250 mg | ORAL_TABLET | Freq: Every day | ORAL | 2 refills | Status: DC

## 2017-12-31 MED ORDER — LOSARTAN POTASSIUM 25 MG PO TABS
25.0000 mg | ORAL_TABLET | Freq: Two times a day (BID) | ORAL | 5 refills | Status: DC
Start: 2017-12-31 — End: 2018-01-30

## 2017-12-31 MED ORDER — SPIRONOLACTONE 25 MG PO TABS: 25.0000 mg | ORAL_TABLET | Freq: Every day | ORAL | 5 refills | Status: DC

## 2017-12-31 NOTE — Discharge Summary (Signed)
Discharge Summary    Patient ID: Stephen [name],  MRN: [id number], DOB/AGE: 53-11-1966 53 y.o.  Admit date: 12/23/2017 Discharge date: 12/31/2017  Primary Care Provider: [name] Primary Cardiologist: Kathlyn Sacramento, MD  Electrophysiologist: [name]  Discharge Diagnoses    Principal Problem:   Cardiogenic shock [facility]) Active Problems:   Acute on chronic systolic CHF (congestive heart failure) ([place])   Frequent PVCs   AKI (acute kidney injury) ([place])   Influenza A   Cardiac arrest ([place])   NICM (nonischemic cardiomyopathy) ([place])   Allergies Allergies  Allergen Reactions  . Lisinopril Cough    Diagnostic Studies/Procedures    None _____________   History of Present Illness     Stephen [name] is a 52 y/o male with longstanding NICM and systolic HF transferred from [facility] for management of worsening HF, Shock, Influenzae A and VF arrest   He had a cath in Sept 2018 by Dr Ubaldo Glassing. He is followed by Dr Fletcher Anon in [place]. He actually just saw Dr Caryl Comes in the office 12/19/17 for evaluation of an ICD, The pt apparently has frequent PVCs and Dr Caryl Comes recommended PVC suppression with Amiodarone and then re evaluate his LVF. On 12/20/17 the pt presented to [facility] ED with SOB and cough. He had CHF, possible superimposed CAP, and was positive for influenza A. He was admitted for ABXs and diuresis. On the night of 12/22/17 he says he had chest pressure and then had a VF arrest. He was shocked once, not intubated. He was hypotensive post shock and was placed on IV Neo-Synephrine. An IV Amiodarone drip was started and the pt was transferred to [facility] for further evaluation and treatment. He was stable at the time on IV pressors.  Hospital Course     Consultants: Electrophysiology  1. Acute on chronic systolic HFwith cardiogenic shock: Due to NiCM. EF 20%.  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  Cardiac MRI with EF 16%, severe LV dilation and small area of mid-wall LGE in the mid  inferolateral wall.  Possible prior myocarditis as cause of cardiomyopathy with PVCs contributing.  Consider cardiac sarcoidosis with LGE pattern but review of 9/18 CTA chest did not show signs of pulmonary sarcoidosis or hilar adenopathy.  Now off milrinone.  He looks euvolemic, denies dyspnea.  - Continue Coreg 3.125 mg bid. - Volume status looks good on Lasix 40 mg daily.   - Continue losartan 25 mg bid.   - Continue spironolactone 25 daily, watch K as upper normal today => will need BMET on Friday.  - Continue digoxin.  2. VF arrest on 12/24 at [facility]: S/p defibrillation x 1. Multiple runs VT overnight into 12/24/17. Improved with decreasing norepi and starting IV amio. Now has [employer] ICD (12/30/2017) - Continue po amiodarone and Coreg.   - No driving x 6 months.  3. Acute on chronic renal failure stage III: Stable.  Will need repeat labs Friday.  4. Influenza A: Tamiflu completed 12/26.  - Afebrile. No change.  5. Frequent PVCs:  - May be playing a role in his cardiomyopathy.  - Continue amiodarone 200 mg bid.   - Now on Coreg.  6. Disposition: May go home today.  Will need CHF clinic followup 10-14 days.  Needs BMET + digoxin level at [facility] office Friday. Will need EP followup.  Follow low K diet.  Meds for home: Coreg 3.125 mg bid, amiodarone 200 mg bid x 1 week then 200 mg daily, losartan 25 mg bid, spironolactone  25 mg daily, Lasix 40 daily, digoxin 0.125 daily, ASA 81, Plavix 75, atorvastatin 40 daily.    Patient has been seen by Dr. Aundra Dubin today and deemed ready for discharge home. All follow up appointments have been scheduled. Discharge medications are listed below.  _____________  Discharge Vitals Blood pressure 134/78, pulse 77, temperature 98.8 F (37.1 C), temperature source Oral, resp. rate 17, height 6\' 2"  (1.88 m), weight 226 lb 12.8 oz (102.9 kg), SpO2 98 %.  Filed Weights   12/29/17 0532 12/30/17 0608 12/31/17 0653  Weight: 225 lb 8 oz (102.3 kg) 224 lb 8 oz  (101.8 kg) 226 lb 12.8 oz (102.9 kg)    Labs & Radiologic Studies    CBC Recent Labs    12/30/17 0550 12/31/17 0406  WBC 12.2* 10.1  NEUTROABS 7.4 6.4  HGB 15.6 15.4  HCT 47.2 47.0  MCV 90.1 90.6  PLT 325 527   Basic Metabolic Panel Recent Labs    12/30/17 0550 12/31/17 0406  NA 137 137  K 4.6 5.0  CL 99* 100*  CO2 28 28  GLUCOSE 101* 106*  BUN 20 21*  CREATININE 1.25* 1.30*  CALCIUM 9.1 9.0  MG  --  2.2   Liver Function Tests No results for input(s): AST, ALT, ALKPHOS, BILITOT, PROT, ALBUMIN in the last 72 hours. No results for input(s): LIPASE, AMYLASE in the last 72 hours. Cardiac Enzymes No results for input(s): CKTOTAL, CKMB, CKMBINDEX, TROPONINI in the last 72 hours. BNP Invalid input(s): POCBNP D-Dimer No results for input(s): DDIMER in the last 72 hours. Hemoglobin A1C No results for input(s): HGBA1C in the last 72 hours. Fasting Lipid Panel No results for input(s): CHOL, HDL, LDLCALC, TRIG, CHOLHDL, LDLDIRECT in the last 72 hours. Thyroid Function Tests No results for input(s): TSH, T4TOTAL, T3FREE, THYROIDAB in the last 72 hours.  Invalid input(s): FREET3 _____________  Dg Chest 2 View  Result Date: 12/31/2017 CLINICAL DATA:  Pacemaker insertion EXAM: CHEST  2 VIEW COMPARISON:  12/23/2017 FINDINGS: Single lead AICD is in place with the lead in the right ventricle in good position. No pneumothorax. Cardiac enlargement. Pulmonary vascular congestion with improvement in pulmonary edema since the prior study. Mild bibasilar atelectasis. No effusion. IMPRESSION: Satisfactory pacemaker placement. Improvement in congestive heart failure. Electronically Signed   By: Franchot Gallo M.D.   On: 12/31/2017 09:07   Dg Chest 2 View  Result Date: 12/20/2017 CLINICAL DATA:  Cough.  Body aches.  Fevers. EXAM: CHEST  2 VIEW COMPARISON:  CT 09/28/2017.  Chest x-ray 920 08/07/2017. FINDINGS: Severe cardiomegaly with pulmonary vascular prominence and bilateral  interstitial prominence and bibasilar infiltrates. These findings suggest congestive heart failure with bilateral pulmonary edema. Bilateral pneumonia cannot be excluded . IMPRESSION: Severe cardiomegaly. Pulmonary venous congestion with bilateral interstitial prominence and bibasilar alveolar infiltrates. These findings suggest congestive heart failure with pulmonary edema. Bilateral pneumonia cannot be excluded. Electronically Signed   By: Marcello Moores  Register   On: 12/20/2017 15:13   Dg Chest Port 1 View  Result Date: 12/23/2017 CLINICAL DATA:  Central venous line placement. EXAM: PORTABLE CHEST 1 VIEW COMPARISON:  12/23/2017 FINDINGS: Right IJ Swan-Ganz catheter is present with tip fairly peripheral in the right descending pulmonary artery. No appreciable pneumothorax. Moderate enlargement of the cardiopericardial silhouette with indistinct pulmonary vasculature and some faint perihilar and basilar airspace opacities. IMPRESSION: 1. Right IJ Swan-Ganz catheter tip: Peripheral in the right descending pulmonary artery. 2. Moderate enlargement of the cardiopericardial silhouette with suspected mild to moderate pulmonary  edema. Correlate with pulmonary dural pressure measurements. Electronically Signed   By: Van Clines M.D.   On: 12/23/2017 14:41   Dg Chest Port 1 View  Result Date: 12/23/2017 CLINICAL DATA:  52 year old male status post cardiac arrest. EXAM: PORTABLE CHEST 1 VIEW COMPARISON:  Chest radiograph dated 12/20/2017 FINDINGS: There is moderate cardiomegaly with findings of vascular congestion and pulmonary edema as seen previously. Overall no significant interval change compared to the earlier radiograph. For no pneumothorax. No acute osseous pathology. IMPRESSION: Cardiomegaly with findings of CHF and pulmonary edema. Overall no significant interval change. Electronically Signed   By: Anner Crete M.D.   On: 12/23/2017 01:11   Mr Cardiac Morphology W Wo Contrast  Result Date:  12/28/2017 CLINICAL DATA:  Cardiomyopathy of uncertain etiology. EXAM: CARDIAC MRI TECHNIQUE: The patient was scanned on a 1.5 Tesla GE magnet. A dedicated cardiac coil was used. Functional imaging was done using Fiesta sequences. 2,3, and 4 chamber views were done to assess for RWMA's. Modified Simpson's rule using a short axis stack was used to calculate an ejection fraction on a dedicated work Conservation officer, nature. The patient received 30 cc of Multihance. After 10 minutes inversion recovery sequences were used to assess for infiltration and scar tissue. FINDINGS: Technically difficult study with frequent PVCs. Limited images of the lung fields show possible pulmonary edema. Severely dilated left ventricle with severe diffuse hypokinesis. EF 16%. Moderately dilated right ventricle with mildly decreased systolic function. Moderate right atrial enlargement. Moderate to severe left atrial enlargement. Mild tricuspid regurgitation. Trileaflet aortic valve with no stenosis or regurgitation. Probably mild mitral regurgitation. Delayed enhancement imaging showed a small area of mid-wall late gadolinium enhancement (LGE) in the mid inferolateral wall. MEASUREMENTS: MEASUREMENTS LVEDV 595 mL LV SV 95 mL LV EF 16% IMPRESSION: 1.  Severely dilated LV with EF 16%, diffuse hypokinesis. 2.  Moderately dilated RV with mildly decreased systolic function. 3.  Biatrial enlargement. 4. Mid-wall LGE in the mid inferolateral wall. This is not a coronary disease pattern. Prior myocarditis or less likely cardiac sarcoidosis would be considerations. Dalton Mclean Electronically Signed   By: Loralie Champagne M.D.   On: 12/28/2017 21:14   Disposition   Pt is being discharged home today in good condition.  Follow-up Plans & Appointments    Follow-up Information    [facility] Follow up on 01/09/2018.   Specialty:  Cardiology Why:  Appointment: Thursday 01/09/18 at 11:30 am.   Parking on [facility] through Architect entrance (Parking Code: [id number]) or by ED entrance (enter under blue awning). Take meds day of your appt as ordered and bring list or bottles. Contact information: [address] [id number] [place] [employer] [phone]       [place] [facility] Follow up on 01/08/2018.   Specialty:  Cardiology Why:  at [facility] information: [address] [place]       [place] Follow up.   Specialty:  Cardiology Why:  You need labs checked on Friday 01/03/18. The office will call you to arrange. If you are not called, go to the office on Friday for labs.  Contact information: [address] [place] [phone]       Deboraha Sprang, MD Follow up.   Specialty:  Cardiology Why:  Follow up for ICD with Dr. Olin Pia PA, [place] Marshall. February 19th at 10:45.  Contact information: [place] [address] [phone]          Discharge Instructions    (HEART FAILURE PATIENTS) Call MD:  Anytime you have any of the following symptoms: 1) 3 pound weight gain in 24 hours or 5 pounds in 1 week 2) shortness of breath, with or without a dry hacking cough 3) swelling in the hands, feet or stomach 4) if you have to sleep on extra pillows at night in order to breathe.   Complete by:  As directed    Diet - low sodium heart healthy   Complete by:  As directed    Discharge instructions   Complete by:  As directed    Follow a low potassium diet. See the attached list of high potassium foods to avoid.   Increase activity slowly   Complete by:  As directed      Discharge Medications   Allergies as of 12/31/2017      Reactions   Lisinopril Cough      Medication List    STOP taking these medications   amiodarone 360-4.14 MG/200ML-% Soln Commonly known as:   NEXTERONE PREMIX   ivabradine 5 MG Tabs tablet Commonly known as:  CORLANOR     TAKE these medications   acetaminophen 500 MG tablet Commonly known as:  TYLENOL Take 1 capsule by mouth every 4 (four) hours as needed for fever.   albuterol 108 (90 Base) MCG/ACT inhaler Commonly known as:  PROVENTIL HFA;VENTOLIN HFA Inhale 2 puffs into the lungs every 4 (four) hours as needed for wheezing or shortness of breath.   amiodarone 200 MG tablet Commonly known as:  PACERONE Take 1 tablet (200 mg total) by mouth daily. Take one 200 mg tablet twice a day for 1 week then take one tablet daily.   aspirin 81 MG EC tablet Take 1 tablet (81 mg total) by mouth daily. Start taking on:  01/01/2018   atorvastatin 40 MG tablet Commonly known as:  LIPITOR Take 1 tablet (40 mg total) by mouth daily at 6 PM. What changed:  when to take this   carvedilol 3.125 MG tablet Commonly known as:  COREG Take 1 tablet (3.125 mg total) by mouth 2 (two) times daily with a meal. What changed:    medication strength  how much to take  when to take this   Chest Rub 4.8-1.2-2.6 % Oint Apply 1 application topically daily as needed (congestino).   clopidogrel 75 MG tablet Commonly known as:  PLAVIX Take 1 tablet (75 mg total) by mouth daily.   cyclobenzaprine 10 MG tablet Commonly known as:  FLEXERIL Take 1 tablet (10 mg total) by mouth 3 (three) times daily as needed for muscle spasms.   diclofenac sodium 1 % Gel Commonly known as:  VOLTAREN Apply 2 g topically 4 (four) times daily as needed (pain).   digoxin 0.125 MG tablet Commonly known as:  LANOXIN Take 1 tablet (0.125 mg total) by mouth daily. Start taking on:  01/01/2018   docusate sodium 100 MG capsule Commonly known as:  COLACE Take 1 capsule (100 mg total) by mouth 2 (two) times daily as needed for mild constipation.   fluticasone 50 MCG/ACT nasal spray Commonly known as:  FLONASE Place 2 sprays into both nostrils daily. What changed:      when to take this  reasons to take this   Fluticasone-Salmeterol 250-50 MCG/DOSE Aepb Commonly known as:  ADVAIR DISKUS Inhale 1 puff into  the lungs 2 (two) times daily.   furosemide 40 MG tablet Commonly known as:  LASIX Take 1 tablet (40 mg total) by mouth daily. Start taking on:  01/01/2018   losartan 25 MG tablet Commonly known as:  COZAAR Take 1 tablet (25 mg total) by mouth 2 (two) times daily.   nitroGLYCERIN 0.4 MG SL tablet Commonly known as:  NITROSTAT Place 1 tablet (0.4 mg total) under the tongue every 5 (five) minutes as needed for chest pain.   pantoprazole 40 MG tablet Commonly known as:  PROTONIX Take 1 tablet (40 mg total) by mouth daily.   polyethylene glycol packet Commonly known as:  MIRALAX / GLYCOLAX Take 17 g by mouth daily. What changed:    when to take this  reasons to take this   SPIRIVA HANDIHALER 18 MCG inhalation capsule Generic drug:  tiotropium Place 18 mcg daily into inhaler and inhale.   spironolactone 25 MG tablet Commonly known as:  ALDACTONE Take 1 tablet (25 mg total) by mouth daily. Start taking on:  01/01/2018         Outstanding Labs/Studies   Needs BMet and dig level on Friday 01/03/18- electronic message sent to the [facility] office to call pt and arrange.   Duration of Discharge Encounter   Greater than 30 minutes including physician time.  Signed, Daune Perch NP 12/31/2017, 1:08 PM

## 2017-12-31 NOTE — Progress Notes (Signed)
Patient ID: Stephen [name], male   DOB: 1966/03/07, 52 y.o.   MRN: [id number]    Advanced Heart Failure Rounding Note   Subjective:    Transferred from [facility] 12/24 with cardiogenic shock in setting of the flu and previous NICM EF 20%  Swan placed. Started on NE and milrinone. Output improved but developed multiple episodes of VT. Weaned off milrinone and norepinephrine.   Medtronic ICD placed 12/31.    No complaints today, no dyspnea.  Ready to go home.   Cardiac MRI: severe LV dilation, EF 16%, mildly decreased RV systolic function, small area of mid-wall LGE in the mid inferolateral wall.   Objective:   Weight Range:  Vital Signs:   Temp:  [98.4 F (36.9 C)-98.8 F (37.1 C)] 98.8 F (37.1 C) (01/01 0653) Pulse Rate:  [71-96] 77 (12/31 2059) Resp:  [17-125] 17 (12/31 2059) BP: (96-134)/(56-99) 134/78 (01/01 1049) SpO2:  [93 %-98 %] 98 % (01/01 1049) Weight:  [226 lb 12.8 oz (102.9 kg)] 226 lb 12.8 oz (102.9 kg) (01/01 0653) Last BM Date: 12/28/17  Weight change: Filed Weights   12/29/17 0532 12/30/17 0608 12/31/17 0653  Weight: 225 lb 8 oz (102.3 kg) 224 lb 8 oz (101.8 kg) 226 lb 12.8 oz (102.9 kg)    Intake/Output:   Intake/Output Summary (Last 24 hours) at 12/31/2017 1109 Last data filed at 12/31/2017 1012 Gross per 24 hour  Intake 1040 ml  Output 2120 ml  Net -1080 ml    Physical Exam: General: NAD Neck: No JVD, no thyromegaly or thyroid nodule.  Lungs: Clear to auscultation bilaterally with normal respiratory effort. CV: Nondisplaced PMI.  Heart regular S1/S2, no S3/S4, no murmur.  No peripheral edema.  No carotid bruit.  Normal pedal pulses.  Abdomen: Soft, nontender, no hepatosplenomegaly, no distention.  Skin: Intact without lesions or rashes. ICD site benign.  Neurologic: Alert and oriented x 3.  Psych: Normal affect. Extremities: No clubbing or cyanosis.  HEENT: Normal.   Telemetry: NSR with PVCs, personally reviewed.   Labs: Basic Metabolic  Panel: Recent Labs  Lab 12/25/17 0338  12/27/17 0411 12/28/17 0422 12/29/17 0431 12/30/17 0550 12/31/17 0406  NA 135   < > 137 138 134* 137 137  K 3.2*   < > 4.1 4.6 4.9 4.6 5.0  CL 95*   < > 95* 98* 96* 99* 100*  CO2 30   < > 33* 31 28 28 28   GLUCOSE 100*   < > 106* 113* 105* 101* 106*  BUN 44*   < > 22* 16 16 20  21*  CREATININE 1.67*   < > 1.29* 1.20 1.25* 1.25* 1.30*  CALCIUM 8.0*   < > 8.8* 9.2 9.2 9.1 9.0  MG 1.8  --   --  2.2  --   --  2.2   < > = values in this interval not displayed.    Liver Function Tests: Recent Labs  Lab 12/27/17 0411  AST 42*  ALT 37  ALKPHOS 74  BILITOT 1.1  PROT 6.2*  ALBUMIN 2.6*   No results for input(s): LIPASE, AMYLASE in the last 168 hours. No results for input(s): AMMONIA in the last 168 hours.  CBC: Recent Labs  Lab 12/30/17 0550 12/31/17 0406  WBC 12.2* 10.1  NEUTROABS 7.4 6.4  HGB 15.6 15.4  HCT 47.2 47.0  MCV 90.1 90.6  PLT 325 348    Cardiac Enzymes: No results for input(s): CKTOTAL, CKMB, CKMBINDEX, TROPONINI in the last 168 hours.  BNP: BNP (last 3 results) Recent Labs    10/08/17 1710 12/23/17 0034 12/24/17 0303  BNP 1,259.0* 1,017.0* 1,335.4*   ProBNP (last 3 results) No results for input(s): PROBNP in the last 8760 hours.  Other results:  Imaging: Dg Chest 2 View  Result Date: 12/31/2017 CLINICAL DATA:  Pacemaker insertion EXAM: CHEST  2 VIEW COMPARISON:  12/23/2017 FINDINGS: Single lead AICD is in place with the lead in the right ventricle in good position. No pneumothorax. Cardiac enlargement. Pulmonary vascular congestion with improvement in pulmonary edema since the prior study. Mild bibasilar atelectasis. No effusion. IMPRESSION: Satisfactory pacemaker placement. Improvement in congestive heart failure. Electronically Signed   By: Franchot Gallo M.D.   On: 12/31/2017 09:07    Medications:    Scheduled Medications: . amiodarone  200 mg Oral BID  . aspirin EC  81 mg Oral Daily  . atorvastatin   40 mg Oral q1800  . carvedilol  3.125 mg Oral BID WC  . clopidogrel  75 mg Oral Daily  . digoxin  0.125 mg Oral Daily  . furosemide  40 mg Oral Daily  . losartan  25 mg Oral BID  . mouth rinse  15 mL Mouth Rinse BID  . sodium chloride flush  10-40 mL Intracatheter Q12H  . spironolactone  25 mg Oral Daily    Infusions:   PRN Medications: acetaminophen, benzonatate, nitroGLYCERIN, ondansetron (ZOFRAN) IV, sodium chloride, sodium chloride flush  Assessment:   Stephen [name] is a 52 y/o male with longstanding NICM and systolic HF transferred from [facility] for management of worsening HF, Shock, Influenzae A and VF arrest.  Plan/Discussion:    1. Acute on chronic systolic HF with cardiogenic shock: Due to NiCM. EF 20%.  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  Cardiac MRI with EF 16%, severe LV dilation and small area of mid-wall LGE in the mid inferolateral wall.  Possible prior myocarditis as cause of cardiomyopathy with PVCs contributing.  Consider cardiac sarcoidosis with LGE pattern but review of 9/18 CTA chest did not show signs of pulmonary sarcoidosis or hilar adenopathy.  Now off milrinone.  He looks euvolemic, denies dyspnea.  - Continue Coreg 3.125 mg bid. - Volume status looks good on Lasix 40 mg daily.   - Continue losartan 25 mg bid.   - Continue spironolactone 25 daily, watch K as upper normal today => will need BMET on Friday.  - Continue digoxin.  2. VF arrest on 12/24 at [facility]: S/p defibrillation x 1. Multiple runs VT overnight into 12/24/17. Improved with decreasing norepi and starting IV amio. Now has Medtronic ICD.  - Continue po amiodarone and Coreg.   - No driving x 6 months.  3. Acute on chronic renal failure stage III: Stable.  Will need repeat labs Friday.  4. Influenza A: Tamiflu completed 12/26.  - Afebrile. No change.  5. Frequent PVCs:  - May be playing a role in his cardiomyopathy.  - Continue amiodarone 200 mg bid.   - Now on Coreg.  6. Disposition: May go  home today.  Will need CHF clinic followup 10-14 days.  Needs BMET + digoxin level at [facility] office Friday. Will need EP followup.  Follow low K diet.  Meds for home: Coreg 3.125 mg bid, amiodarone 200 mg bid x 1 week then 200 mg daily, losartan 25 mg bid, spironolactone 25 mg daily, Lasix 40 daily, digoxin 0.125 daily, ASA 81, Plavix 75, atorvastatin 40 daily.   Length of Stay: 8  Loralie Champagne, MD  12/31/2017, 11:09 AM  Advanced Heart Failure Team Pager [phone] (M-F; 7a - 4p)  Please contact [facility] for night-coverage after hours (4p -7a ) and weekends on [email or web address]

## 2017-12-31 NOTE — Progress Notes (Addendum)
PA Kathlen Mody notified of patient episode of trigem.PVCs patient is stable, patient is alert oriented x4, blood pressure and o2 levels are stable, patient asymptomatic  Neta Mends RN 10:48 AM 12-31-2017

## 2018-01-01 ENCOUNTER — Telehealth: Payer: Self-pay | Admitting: Internal Medicine

## 2018-01-01 NOTE — Telephone Encounter (Signed)
Attempted to call patient, but VM was full  Will try again at a later time

## 2018-01-01 NOTE — Telephone Encounter (Signed)
-----   Message from Daune Perch, NP sent at 12/31/2017 12:01 PM EST ----- Regarding: Pt needs labs This pt is being discharged from [facility] on 12/31/17. He needs a BMet and dig level on Friday. 01/03/18. I placed an order but don't know if it can be seen at your office.  Can you please call the patient to arrange for these labs for Friday.   Thanks, Daune Perch, AGNP-C [facility] 12/31/2017  12:05 PM

## 2018-01-02 ENCOUNTER — Other Ambulatory Visit: Payer: Self-pay

## 2018-01-02 ENCOUNTER — Telehealth: Payer: Self-pay | Admitting: Internal Medicine

## 2018-01-02 DIAGNOSIS — I38 Endocarditis, valve unspecified: Secondary | ICD-10-CM

## 2018-01-02 DIAGNOSIS — I5023 Acute on chronic systolic (congestive) heart failure: Principal | ICD-10-CM

## 2018-01-02 DIAGNOSIS — Z79899 Other long term (current) drug therapy: Secondary | ICD-10-CM

## 2018-01-02 MED ORDER — FUROSEMIDE 40 MG PO TABS: 40.0000 mg | ORAL_TABLET | Freq: Every day | ORAL | 3 refills | Status: DC

## 2018-01-02 NOTE — Telephone Encounter (Signed)
Lmov for patient to call back and schedule lab appointment  Will try again at a later time

## 2018-01-02 NOTE — Telephone Encounter (Signed)
Please call pt sister regarding pt Lasix that he was given at D/C from the hospital.

## 2018-01-02 NOTE — Telephone Encounter (Signed)
S/w pt's sister. Pt discharged January 1 s/p cardiogenic shock and ICD implant. Per [facility] discharge instructions, pt prescribed lasix 40mg  qd. Per sister, [facility] reports insurance will not pay for lasix as he picked up a 90 day supply in November. Pt can not remember this and does not have any at home. Prescription sent to [facility] as it is on the $4 list.  Howell Rucks is aware pt to have labs tomorrow at the [facility]. Orders corrected to reflect [facility] lab. Reviewed upcoming appt including January 10 in [place].  Pt will need TCM f/u appt which has not been made. Pt will rely on friends and family for transportation as he may not drive for 6 months. Sister lives in [place]. Will schedule pt appt w/Dr. Fletcher Anon and call sister and/or pt to confirm.

## 2018-01-03 ENCOUNTER — Other Ambulatory Visit: Payer: Self-pay | Admitting: Internal Medicine

## 2018-01-03 ENCOUNTER — Other Ambulatory Visit
Admission: RE | Admit: 2018-01-03 | Discharge: 2018-01-03 | Disposition: A | Discharge status: Home / Self Care | Payer: Commercial Managed Care - PPO | Source: Ambulatory Visit | Attending: Cardiology | Admitting: Cardiology

## 2018-01-03 ENCOUNTER — Telehealth: Payer: Self-pay | Admitting: Internal Medicine

## 2018-01-03 DIAGNOSIS — I5023 Acute on chronic systolic (congestive) heart failure: Secondary | ICD-10-CM | POA: Diagnosis present

## 2018-01-03 DIAGNOSIS — I5043 Acute on chronic combined systolic (congestive) and diastolic (congestive) heart failure: Secondary | ICD-10-CM

## 2018-01-03 DIAGNOSIS — Z79899 Other long term (current) drug therapy: Secondary | ICD-10-CM | POA: Insufficient documentation

## 2018-01-03 DIAGNOSIS — I38 Endocarditis, valve unspecified: Secondary | ICD-10-CM | POA: Diagnosis present

## 2018-01-03 LAB — BASIC METABOLIC PANEL
ANION GAP: 12 (ref 5–15)
BUN: 27 mg/dL — ABNORMAL HIGH (ref 6–20)
CHLORIDE: 100 mmol/L — AB (ref 101–111)
CO2: 26 mmol/L (ref 22–32)
Calcium: 9.7 mg/dL (ref 8.9–10.3)
Creatinine, Ser: 1.21 mg/dL (ref 0.61–1.24)
GFR calc non Af Amer: >60 mL/min (ref >60)
Glucose, Bld: 99 mg/dL (ref 65–99)
POTASSIUM: 4.9 mmol/L (ref 3.5–5.1)
SODIUM: 138 mmol/L (ref 135–145)

## 2018-01-03 LAB — DIGOXIN LEVEL: Digoxin Level: 0.7 ng/mL — ABNORMAL LOW (ref 0.8–2.0)

## 2018-01-03 MED ORDER — FLUTICASONE-SALMETEROL 250-50 MCG/DOSE IN AEPB: 1.0000 | INHALATION_SPRAY | Freq: Two times a day (BID) | RESPIRATORY_TRACT | 0 refills | Status: DC

## 2018-01-03 NOTE — Telephone Encounter (Signed)
Patient recently released from hospital. Contacted patient and notified he needs office visit before future refill. A one time fill has been submitted.

## 2018-01-03 NOTE — Telephone Encounter (Signed)
Drug interaction reported by pharmacy for digoxin and amiodarone.  No clear need for digoxin in my estimation.  I have called and left a voicemail for the patient to stop his digoxin seem to call the office to confirm receipt of this message.  I will also forward to triage in [place].

## 2018-01-06 NOTE — Telephone Encounter (Signed)
No answer. Left message to call back.   

## 2018-01-07 NOTE — Telephone Encounter (Signed)
Pt states he did labs in [place]  Nothing further needed.

## 2018-01-07 NOTE — Telephone Encounter (Signed)
Pt had recent hospital admission; needs follow up with Dr. Fletcher Anon. Routed to scheduling.

## 2018-01-07 NOTE — Telephone Encounter (Signed)
Pt scheduled for 03/04/18   States he wanted to see Dr Fletcher Anon  Nothing else needed

## 2018-01-08 ENCOUNTER — Ambulatory Visit: Payer: Commercial Managed Care - PPO

## 2018-01-08 NOTE — Telephone Encounter (Signed)
I left a message for the patient to call. 

## 2018-01-09 ENCOUNTER — Encounter (HOSPITAL_COMMUNITY): Payer: Self-pay

## 2018-01-09 ENCOUNTER — Ambulatory Visit
Admission: RE | Admit: 2018-01-09 | Discharge: 2018-01-09 | Disposition: A | Discharge status: Home / Self Care | Payer: Commercial Managed Care - PPO | Source: Ambulatory Visit | Attending: Internal Medicine | Admitting: Internal Medicine

## 2018-01-09 ENCOUNTER — Ambulatory Visit (INDEPENDENT_AMBULATORY_CARE_PROVIDER_SITE_OTHER): Payer: Commercial Managed Care - PPO | Admitting: *Deleted

## 2018-01-09 ENCOUNTER — Ambulatory Visit (HOSPITAL_COMMUNITY)
Admit: 2018-01-09 | Discharge: 2018-01-09 | Disposition: A | Discharge status: Home / Self Care | Payer: Commercial Managed Care - PPO | Source: Ambulatory Visit | Attending: Internal Medicine | Admitting: Internal Medicine

## 2018-01-09 VITALS — BP 128/88 | Ht 73.5 in | Wt 233.6 lb

## 2018-01-09 DIAGNOSIS — I10 Essential (primary) hypertension: Secondary | ICD-10-CM

## 2018-01-09 DIAGNOSIS — Z79899 Other long term (current) drug therapy: Secondary | ICD-10-CM | POA: Diagnosis not present

## 2018-01-09 DIAGNOSIS — I5022 Chronic systolic (congestive) heart failure: Secondary | ICD-10-CM | POA: Insufficient documentation

## 2018-01-09 DIAGNOSIS — I472 Ventricular tachycardia, unspecified: Secondary | ICD-10-CM

## 2018-01-09 DIAGNOSIS — Z8249 Family history of ischemic heart disease and other diseases of the circulatory system: Secondary | ICD-10-CM | POA: Diagnosis not present

## 2018-01-09 DIAGNOSIS — I130 Hypertensive heart and chronic kidney disease with heart failure and stage 1 through stage 4 chronic kidney disease, or unspecified chronic kidney disease: Secondary | ICD-10-CM | POA: Diagnosis not present

## 2018-01-09 DIAGNOSIS — I493 Ventricular premature depolarization: Secondary | ICD-10-CM | POA: Insufficient documentation

## 2018-01-09 DIAGNOSIS — N183 Chronic kidney disease, stage 3 unspecified: Secondary | ICD-10-CM

## 2018-01-09 DIAGNOSIS — Z95810 Presence of automatic (implantable) cardiac defibrillator: Secondary | ICD-10-CM

## 2018-01-09 DIAGNOSIS — Z7982 Long term (current) use of aspirin: Secondary | ICD-10-CM | POA: Diagnosis not present

## 2018-01-09 DIAGNOSIS — Z87891 Personal history of nicotine dependence: Secondary | ICD-10-CM | POA: Insufficient documentation

## 2018-01-09 DIAGNOSIS — Z8673 Personal history of transient ischemic attack (TIA), and cerebral infarction without residual deficits: Secondary | ICD-10-CM | POA: Diagnosis not present

## 2018-01-09 DIAGNOSIS — I4729 Other ventricular tachycardia: Secondary | ICD-10-CM

## 2018-01-09 DIAGNOSIS — Z7902 Long term (current) use of antithrombotics/antiplatelets: Secondary | ICD-10-CM | POA: Insufficient documentation

## 2018-01-09 DIAGNOSIS — Z8674 Personal history of sudden cardiac arrest: Secondary | ICD-10-CM | POA: Insufficient documentation

## 2018-01-09 DIAGNOSIS — I2510 Atherosclerotic heart disease of native coronary artery without angina pectoris: Secondary | ICD-10-CM | POA: Insufficient documentation

## 2018-01-09 DIAGNOSIS — I429 Cardiomyopathy, unspecified: Secondary | ICD-10-CM | POA: Insufficient documentation

## 2018-01-09 LAB — CUP PACEART INCLINIC DEVICE CHECK
Battery Remaining Longevity: 137 mo
Brady Statistic RV Percent Paced: 0.05 %
HighPow Impedance: 64 Ohm
Implantable Lead Location: 753860
Implantable Pulse Generator Implant Date: 20181231
Lead Channel Impedance Value: 304 Ohm
Lead Channel Impedance Value: 361 Ohm
Lead Channel Pacing Threshold Amplitude: 2 V
Lead Channel Setting Pacing Pulse Width: 0.4 ms
Lead Channel Setting Sensing Sensitivity: 0.3 mV
MDC IDC LEAD IMPLANT DT: 20181231
MDC IDC MSMT BATTERY VOLTAGE: 3.13 V
MDC IDC MSMT LEADCHNL RV PACING THRESHOLD PULSEWIDTH: 0.4 ms
MDC IDC MSMT LEADCHNL RV SENSING INTR AMPL: 4.875 mV
MDC IDC MSMT LEADCHNL RV SENSING INTR AMPL: 6.5 mV
MDC IDC SESS DTM: 20190110151440
MDC IDC SET LEADCHNL RV PACING AMPLITUDE: 4 V

## 2018-01-09 MED ORDER — CARVEDILOL 6.25 MG PO TABS: 6.2500 mg | ORAL_TABLET | Freq: Two times a day (BID) | ORAL | 6 refills | Status: DC

## 2018-01-09 NOTE — Progress Notes (Signed)
Wound check appointment. Dermabond removed. Wound without redness or edema. Incision edges approximated, wound well healed. Normal device function. sensing, and impedances consistent with implant measurements. RV threshold noted to be 2.25V@0 .68ms, pt to obtain chest x ray per SK. Device programmed at 3.5V for extra safety margin until 3 month visit. Histogram distribution appropriate for patient and level of activity. AF episodes appear freq. PVCs. Patient educated about wound care, arm mobility, lifting restrictions, shock plan. ROV w/ SK 02/18/18

## 2018-01-09 NOTE — Progress Notes (Signed)
Advanced Heart Failure Clinic Note   Primary Care: [name], MD Primary Cardiologist: Dr Fletcher Anon Primary EP: [name]  HPI:  Stephen [name] is a 52 y.o. 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   Admitted 12/24 -> 12/31/17. Pt presented to [facility], but transferred to [facility] for management of worsening HF, Shock, Influenzae A, and VF arrest. Swan placed. Started on NE and milrinone with improved output. Pt had multiple episodes of VT so weaned of pressors as tolerated. S/p ICD 12/30/17 Medtronic. Further work up as below including cMRI.   Pt presents today for post hospital follow up.  Feeling great overall. Denies SOB with ADLs, slowly getting energy back. He doesn't feel like he has as much UOP on lasix compared to his PTA torsemide, but again clarifies that he hasn't had any swelling, and denies any SOB.  He would like to get back on Entresto, as he feels this worked better than losartan.  Living at home with wife who helps with medication. Denies orthopnea, lightheadedness or dizziness. No CP. Taking all medications as directed.   Cardiac MRI 12/28/2017: severe LV dilation, EF 16%, mildly decreased RV systolic function, small area of mid-wall LGE in the mid inferolateral wall.   ICD: Interrogated personally. No VT/VF. Several episodes of NSVT/SVT.Optivol not yet calibrated.   Review of systems complete and found to be negative unless listed in HPI.    Past Medical History:  Diagnosis Date  . Arrhythmia   . Cardiac arrest ([place]) 12/23/2017   Brief V-fib arrest  . CHF (congestive heart failure) (HCC)    nonischemic cardiomyopathy, EF 25%  . Hypertension   . NICM (nonischemic cardiomyopathy) ([place])   . TIA (transient ischemic attack) 06/21/16   Current Outpatient Medications  Medication Sig Dispense Refill  . acetaminophen (TYLENOL) 500 MG tablet Take 1 capsule by mouth every 4 (four) hours as needed for fever.    Marland Kitchen albuterol (PROVENTIL HFA;VENTOLIN HFA) 108 (90 Base)  MCG/ACT inhaler Inhale 2 puffs into the lungs every 4 (four) hours as needed for wheezing or shortness of breath. 1 Inhaler 0  . amiodarone (PACERONE) 200 MG tablet Take 1 tablet (200 mg total) by mouth daily. Take one 200 mg tablet twice a day for 1 week then take one tablet daily. 35 tablet 5  . aspirin EC 81 MG EC tablet Take 1 tablet (81 mg total) by mouth daily. 90 tablet 3  . atorvastatin (LIPITOR) 40 MG tablet Take 1 tablet (40 mg total) by mouth daily at 6 PM. (Patient taking differently: Take 40 mg by mouth daily. ) 30 tablet 2  . Camphor-Eucalyptus-Menthol (CHEST RUB) 4.8-1.2-2.6 % OINT Apply 1 application topically daily as needed (congestino).     . carvedilol (COREG) 3.125 MG tablet Take 1 tablet (3.125 mg total) by mouth 2 (two) times daily with a meal. 60 tablet 5  . clopidogrel (PLAVIX) 75 MG tablet Take 1 tablet (75 mg total) by mouth daily. 30 tablet 2  . cyclobenzaprine (FLEXERIL) 10 MG tablet Take 1 tablet (10 mg total) by mouth 3 (three) times daily as needed for muscle spasms. 20 tablet 0  . diclofenac sodium (VOLTAREN) 1 % GEL Apply 2 g topically 4 (four) times daily as needed (pain).     Marland Kitchen digoxin (LANOXIN) 0.125 MG tablet Take 1 tablet (0.125 mg total) by mouth daily. 30 tablet 2  . docusate sodium (COLACE) 100 MG capsule Take 1 capsule (100 mg total) by mouth 2 (two) times daily  as needed for mild constipation. 10 capsule 0  . fluticasone (FLONASE) 50 MCG/ACT nasal spray Place 2 sprays into both nostrils daily. (Patient taking differently: Place 2 sprays into both nostrils daily as needed for allergies. ) 16 g 2  . Fluticasone-Salmeterol (ADVAIR DISKUS) 250-50 MCG/DOSE AEPB Inhale 1 puff into the lungs 2 (two) times daily. 1 each 0  . furosemide (LASIX) 40 MG tablet Take 1 tablet (40 mg total) by mouth daily. 30 tablet 3  . losartan (COZAAR) 25 MG tablet Take 1 tablet (25 mg total) by mouth 2 (two) times daily. 60 tablet 5  . nitroGLYCERIN (NITROSTAT) 0.4 MG SL tablet Place 1  tablet (0.4 mg total) under the tongue every 5 (five) minutes as needed for chest pain. 30 tablet 0  . pantoprazole (PROTONIX) 40 MG tablet Take 1 tablet (40 mg total) by mouth daily. 30 tablet 0  . polyethylene glycol (MIRALAX / GLYCOLAX) packet Take 17 g by mouth daily. (Patient taking differently: Take 17 g by mouth daily as needed for mild constipation. ) 14 each 0  . spironolactone (ALDACTONE) 25 MG tablet Take 1 tablet (25 mg total) by mouth daily. 30 tablet 5  . tiotropium (SPIRIVA HANDIHALER) 18 MCG inhalation capsule Place 18 mcg daily into inhaler and inhale.      No current facility-administered medications for this encounter.    Allergies  Allergen Reactions  . Lisinopril Cough   Social History   Socioeconomic History  . Marital status: Single    Spouse name: Not on file  . Number of children: Not on file  . Years of education: Not on file  . Highest education level: Not on file  Social Needs  . Financial resource strain: Not on file  . Food insecurity - worry: Not on file  . Food insecurity - inability: Not on file  . Transportation needs - medical: Not on file  . Transportation needs - non-medical: Not on file  Occupational History  . Not on file  Tobacco Use  . Smoking status: Former Smoker    Packs/day: 0.25    Years: 20.00    Pack years: 5.00    Types: Cigarettes    Last attempt to quit: 02/20/2016    Years since quitting: 1.8  . Smokeless tobacco: Never Used  Substance and Sexual Activity  . Alcohol use: Yes    Alcohol/week: 0.0 oz    Comment: beer occasional  . Drug use: No  . Sexual activity: Not on file  Other Topics Concern  . Not on file  Social History Narrative   Independent at baseline   Family History  Problem Relation Age of Onset  . Hypertension Mother   . Heart failure Mother   . Hypertension Father   . CAD Father   . Heart attack Father    Hospital Outpatient Visit on 01/03/2018  Component Date Value Ref Range Status  . Digoxin  Level 01/03/2018 0.7* 0.8 - 2.0 ng/mL Final   Performed at [facility], [address] [place], [place] [address]  . Sodium 01/03/2018 138  135 - 145 mmol/L Final  . Potassium 01/03/2018 4.9  3.5 - 5.1 mmol/L Final  . Chloride 01/03/2018 100* 101 - 111 mmol/L Final  . CO2 01/03/2018 26  22 - 32 mmol/L Final  . Glucose, Bld 01/03/2018 99  65 - 99 mg/dL Final  . BUN 01/03/2018 27* 6 - 20 mg/dL Final  . Creatinine, Ser 01/03/2018 1.21  0.61 - 1.24 mg/dL Final  .  Calcium 01/03/2018 9.7  8.9 - 10.3 mg/dL Final  . GFR calc non Af Amer 01/03/2018 >60  >60 mL/min Final  . GFR calc Af Amer 01/03/2018 >60  >60 mL/min Final   Comment: (NOTE) The eGFR has been calculated using the CKD EPI equation. This calculation has not been validated in all clinical situations. eGFR's persistently <60 mL/min signify possible Chronic Kidney Disease.   Georgiann Hahn gap 01/03/2018 12  5 - 15 Final   Performed at [facility], [place], [place] [address]   Vitals:   01/09/18 1101  BP: 128/88  SpO2: 97%  Weight: 233 lb 9.6 oz (106 kg)  Height: 6' 1.5" (1.867 m)   Wt Readings from Last 3 Encounters:  01/09/18 233 lb 9.6 oz (106 kg)  12/31/17 226 lb 12.8 oz (102.9 kg)  12/20/17 250 lb (113.4 kg)    PHYSICAL EXAM: General:  Well appearing. No respiratory difficulty HEENT: normal Neck: supple. no JVD. Carotids 2+ bilat; no bruits. No lymphadenopathy or thyromegaly appreciated. Cor: PMI nondisplaced. Regular rate & rhythm. No rubs, gallops or murmurs. Lungs: clear Abdomen: soft, nontender, nondistended. No hepatosplenomegaly. No bruits or masses. Good bowel sounds. Extremities: no cyanosis, clubbing, rash, edema Neuro: alert & oriented x 3, cranial nerves grossly intact. moves all 4 extremities w/o difficulty. Affect pleasant.  ASSESSMENT & PLAN:  1. Chronic systolic HFwith cardiogenic shock: Due to NiCM. EF 20%.  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  Cardiac MRI with EF  16%, severe LV dilation and small area of mid-wall LGE in the mid inferolateral wall.  Possible prior myocarditis as cause of cardiomyopathy with PVCs contributing.  Consider cardiac sarcoidosis with LGE pattern but review of 9/18 CTA chest did not show signs of pulmonary sarcoidosis or hilar adenopathy.  Now off milrinone.  - NYHA II-III symptoms.  - Volume status looks stable on exam. ReDs vest 27%  - Continue lasix 40 mg daily.  - Increase Coreg 6.25 mg BID.  - Continue losartan 25 mg BID.  K has been too high to restart Entresto. Consider at next visit.  - Continue spironolactone 25 daily. - Continue digoxin 0.125 mg daily 2. VF arrest on 12/24 at [facility]: S/p defibrillation x 1. Multiple runs VT overnight into 12/24/17. Improved with decreasing norepi and starting IV amio. Now has Medtronic ICD.  - No VT/VF on ICD interrogation. - Continue po amiodarone for now.  - Continue coreg as above.  - No driving x 6 months. Pt aware.  3. CKD III: - BMET stable 01/03/18. Wishes to avoid labwork today.  4. Frequent PVCs:  - Continue amidoarone 200 mg BID for now. Will plan to titrate down at Pharm visit.  - Continue low dose coreg as above.   Doing well overall. Increase coreg as above. RTC 3-4 weeks with Pharm-D for med titration.   Shirley Friar, PA-C 01/09/18   Greater than 50% of the 30 minute visit was spent in counseling/coordination of care regarding disease state education, salt/fluid restriction, sliding scale diuretics, and medication compliance.

## 2018-01-09 NOTE — Patient Instructions (Addendum)
INCREASE Carvedilol (Coreg) to 6.25 mg twice daily. Can double up on the current 3.125 mg tablets you have at home (Take 2 tabs twice daily). New Rx has been sent to your pharmacy for 6.25 mg tablets (take 1 tab twice daily).  Follow up 3 weeks with Ileene Patrick CHF clinical pharmacist.  Follow up 6 weeks with Dr. Haroldine Laws.  Take all medication as prescribed the day of your appointment. Bring all medications with you to your appointment.  Do the following things EVERYDAY: 1) Weigh yourself in the morning before breakfast. Write it down and keep it in a log. 2) Take your medicines as prescribed 3) Eat low salt foods-Limit salt (sodium) to 2000 mg per day.  4) Stay as active as you can everyday 5) Limit all fluids for the day to less than 2 liters

## 2018-01-13 ENCOUNTER — Telehealth: Payer: Self-pay | Admitting: Internal Medicine

## 2018-01-13 NOTE — Telephone Encounter (Signed)
New message    Patient sister calling regarding [facility] needing the signed note from Dr Caryl Comes surgery date from 12/31, mri summary and ov note. Patient sister states she has requested from [facility] twice, but still does not have what is needed. Advised Ms. Aggie Hacker to have patient contact [employer]  [facility] states they must have by 1/16. AttnVicki Mallet [phone] ext [phone] No further  action needed at this time.

## 2018-01-15 ENCOUNTER — Telehealth: Payer: Self-pay | Admitting: Cardiovascular Disease

## 2018-01-15 NOTE — Telephone Encounter (Signed)
Per Sister   Patient needs a letter for [employer] stating when he can go back to work and what he was treated for.  This should be on letterhead and he will need help get him approved to get a std check as they have denied him for now because the forms do not contain this information.   This information is due today.   [facility] fax : [phone]    [facility] fax  [phone]

## 2018-01-15 NOTE — Telephone Encounter (Signed)
Dr. Fletcher Anon,   Can you review this and let me know about RTW.  Message states he needs a letter today.   VF arrest- 12/24  ICD implant- 12/31 Has been seen for wound check post ICD implant- 1/10 Followed by CHF clinic also- 1/10  Amiodarone has been titrated down to 200 daily. No driving x 6 months (from 12/23/17).  [name] is out for 2 weeks.

## 2018-01-16 NOTE — Telephone Encounter (Signed)
Message sent to Dr. Fletcher Anon on 1/16 to review as [name] is out of the office until 01/27/18. Will close this note as there is another open for the same thing.

## 2018-01-16 NOTE — Telephone Encounter (Signed)
New message   Patient sister is calling to confirm a return to work date. Please call

## 2018-01-16 NOTE — Telephone Encounter (Signed)
This is a pt of Dr. Olin Pia at [facility].  Will route this to her covering RN there, and their triage pool.

## 2018-01-17 ENCOUNTER — Encounter: Payer: Self-pay | Admitting: *Deleted

## 2018-01-17 NOTE — Telephone Encounter (Signed)
Discussed RTW with Dr. Fletcher Anon. Per Dr. Fletcher Anon, he would prefer the patient stay our of work for another week.   I spoke with the patient and advised him of this.  He states he was told initially by "a couple of doctors" that he would need to be out 6-8 weeks. He is asking for this due to MD visits that are pending. I advised him we could write him out until 02/24/18. He is agreeable with this.   Letter written and signed by Dr. Fletcher Anon. There may need to be some discussion with [name] about his device and the type of work he does. The patient is aware of this. He is scheduled to see [name] back on 02/18/18.  Will re-address at that time.  Letter faxed to [place] at [phone] Confirmation received.

## 2018-01-17 NOTE — Addendum Note (Signed)
Addended by: Alvis Lemmings C on: 01/17/2018 02:45 PM   Modules accepted: Orders

## 2018-01-17 NOTE — Telephone Encounter (Signed)
I spoke with the patient today and he did receive the message from [name] to stop digoxin.  He has been off of this.

## 2018-01-20 ENCOUNTER — Encounter (HOSPITAL_COMMUNITY): Payer: Self-pay | Admitting: *Deleted

## 2018-01-20 NOTE — Progress Notes (Signed)
Received disability paperwork from [employer].  Forms completed/signed and faxed to [phone].  Original forms will be scanned to patient's electronic medical record.

## 2018-01-23 ENCOUNTER — Encounter: Payer: Commercial Managed Care - PPO | Attending: Cardiovascular Disease | Admitting: *Deleted

## 2018-01-23 ENCOUNTER — Encounter: Payer: Self-pay | Admitting: *Deleted

## 2018-01-23 VITALS — Ht 72.75 in | Wt 236.7 lb

## 2018-01-23 DIAGNOSIS — I5022 Chronic systolic (congestive) heart failure: Secondary | ICD-10-CM | POA: Diagnosis present

## 2018-01-23 DIAGNOSIS — I38 Endocarditis, valve unspecified: Secondary | ICD-10-CM | POA: Insufficient documentation

## 2018-01-23 NOTE — Progress Notes (Signed)
Daily Session Note  Patient Details  Name: Stephen [name] MRN: [id number] Date of Birth: 1966/05/28 Referring Provider:     Cardiac Rehab from 01/23/2018 in [facility]  Referring Provider  Arida      Encounter Date: 01/23/2018  Check In: Session Check In - 01/23/18 1143      Check-In   Location  [facility]    Staff Present  Nada Maclachlan, BA, ACSM CEP, Exercise Physiologist;Kym Fenter Sherryll Burger, RN Geralyn Corwin, RN BSN    Supervising physician immediately available to respond to emergencies  See telemetry face sheet for immediately available ER MD    Medication changes reported      No    Fall or balance concerns reported     No    Warm-up and Cool-down  Performed as group-led instruction    Resistance Training Performed  Yes    VAD Patient?  No      Pain Assessment   Currently in Pain?  No/denies    Multiple Pain Sites  No        Exercise Prescription Changes - 01/23/18 1400      Response to Exercise   Blood Pressure (Admit)  103/79    Blood Pressure (Exercise)  112/68    Blood Pressure (Exit)  96/56    Heart Rate (Admit)  72 bpm    Heart Rate (Exercise)  94 bpm    Heart Rate (Exit)  71 bpm    Oxygen Saturation (Admit)  98 %    Oxygen Saturation (Exit)  97 %    Rating of Perceived Exertion (Exercise)  11       Social History   Tobacco Use  Smoking Status Former Smoker  . Packs/day: 0.25  . Years: 20.00  . Pack years: 5.00  . Types: Cigarettes  . Last attempt to quit: 02/20/2016  . Years since quitting: 1.9  Smokeless Tobacco Never Used    Goals Met:  Proper associated with RPD/PD & O2 Sat Exercise tolerated well Personal goals reviewed No report of cardiac concerns or symptoms Strength training completed today  Goals Unmet:  Not Applicable  Comments: Med Review completed   Dr. Emily Filbert is Medical Director for [facility] and [facility].

## 2018-01-23 NOTE — Patient Instructions (Signed)
Patient Instructions  Patient Details  Name: Stephen [name] MRN: [id number] Date of Birth: 11/23/1966 Referring Provider:  Wellington Hampshire, MD  Below are your personal goals for exercise, nutrition, and risk factors. Our goal is to help you stay on track towards obtaining and maintaining these goals. We will be discussing your progress on these goals with you throughout the program.  Initial Exercise Prescription: Initial Exercise Prescription - 01/23/18 1400      Date of Initial Exercise RX and Referring Provider   Date  01/23/18    Referring Provider  Stephen [name]      Treadmill   MPH  2    Grade  1.5    Minutes  15    METs  2.85      Recumbant Bike   Level  2    RPM  60    Watts  37    Minutes  15    METs  3      REL-XR   Level  3    Speed  50    Minutes  15    METs  3      T5 Nustep   Level  2    SPM  80    Minutes  15    METs  3      Prescription Details   Frequency (times per week)  3    Duration  Progress to 30 minutes of continuous aerobic without signs/symptoms of physical distress      Intensity   THRR 40-80% of Max Heartrate  110-148    Ratings of Perceived Exertion  11-13    Perceived Dyspnea  0-4      Progression   Progression  Continue to progress workloads to maintain intensity without signs/symptoms of physical distress.      Resistance Training   Training Prescription  Yes    Weight  4 lb    Reps  10-15       Exercise Goals: Frequency: Be able to perform aerobic exercise two to three times per week in program working toward 2-5 days per week of home exercise.  Intensity: Work with a perceived exertion of 11 (fairly light) - 15 (hard) while following your exercise prescription.  We will make changes to your prescription with you as you progress through the program.   Duration: Be able to do 30 to 45 minutes of continuous aerobic exercise in addition to a 5 minute warm-up and a 5 minute cool-down routine.   Nutrition Goals: Your personal  nutrition goals will be established when you do your nutrition analysis with the dietician.  The following are general nutrition guidelines to follow: Cholesterol < 200mg /day Sodium < 1500mg /day Fiber: Men over 50 yrs - 30 grams per day  Personal Goals: Personal Goals and Risk Factors at Admission - 01/23/18 1342      Core Components/Risk Factors/Patient Goals on Admission    Weight Management  Yes;Weight Loss    Intervention  Weight Management: Develop a combined nutrition and exercise program designed to reach desired caloric intake, while maintaining appropriate intake of nutrient and fiber, sodium and fats, and appropriate energy expenditure required for the weight goal.;Weight Management: Provide education and appropriate resources to help participant work on and attain dietary goals.;Weight Management/Obesity: Establish reasonable short term and long term weight goals.    Admit Weight  230 lb (104.3 kg)    Goal Weight: Short Term  225 lb (102.1 kg)    Goal Weight: Long  Term  225 lb (102.1 kg)    Expected Outcomes  Short Term: Continue to assess and modify interventions until short term weight is achieved;Long Term: Adherence to nutrition and physical activity/exercise program aimed toward attainment of established weight goal;Weight Maintenance: Understanding of the daily nutrition guidelines, which includes 25-35% calories from fat, 7% or less cal from saturated fats, less than 200mg  cholesterol, less than 1.5gm of sodium, & 5 or more servings of fruits and vegetables daily;Weight Loss: Understanding of general recommendations for a balanced deficit meal plan, which promotes 1-2 lb weight loss per week and includes a negative energy balance of [phone] kcal/d;Understanding recommendations for meals to include 15-35% energy as protein, 25-35% energy from fat, 35-60% energy from carbohydrates, less than 200mg  of dietary cholesterol, 20-35 gm of total fiber daily;Understanding of distribution of  calorie intake throughout the day with the consumption of 4-5 meals/snacks    Heart Failure  Yes    Intervention  Provide a combined exercise and nutrition program that is supplemented with education, support and counseling about heart failure. Directed toward relieving symptoms such as shortness of breath, decreased exercise tolerance, and extremity edema.    Expected Outcomes  Improve functional capacity of life;Short term: Attendance in program 2-3 days a week with increased exercise capacity. Reported lower sodium intake. Reported increased fruit and vegetable intake. Reports medication compliance.;Short term: Daily weights obtained and reported for increase. Utilizing diuretic protocols set by physician.;Long term: Adoption of self-care skills and reduction of barriers for early signs and symptoms recognition and intervention leading to self-care maintenance.    Lipids  Yes    Intervention  Provide education and support for participant on nutrition & aerobic/resistive exercise along with prescribed medications to achieve LDL 70mg , HDL >40mg .    Expected Outcomes  Short Term: Participant states understanding of desired cholesterol values and is compliant with medications prescribed. Participant is following exercise prescription and nutrition guidelines.;Long Term: Cholesterol controlled with medications as prescribed, with individualized exercise RX and with personalized nutrition plan. Value goals: LDL < 70mg , HDL > 40 mg.    Stress  Yes Stephen [name] is currently out of work until the end of February. He stated he does not want to go back to work until he is 100% better. His health is his main concern now.     Intervention  Offer individual and/or small group education and counseling on adjustment to heart disease, stress management and health-related lifestyle change. Teach and support self-help strategies.;Refer participants experiencing significant psychosocial distress to appropriate mental health  specialists for further evaluation and treatment. When possible, include family members and significant others in education/counseling sessions.    Expected Outcomes  Short Term: Participant demonstrates changes in health-related behavior, relaxation and other stress management skills, ability to obtain effective social support, and compliance with psychotropic medications if prescribed.;Long Term: Emotional wellbeing is indicated by absence of clinically significant psychosocial distress or social isolation.       Tobacco Use Initial Evaluation: Social History   Tobacco Use  Smoking Status Former Smoker  . Packs/day: 0.25  . Years: 20.00  . Pack years: 5.00  . Types: Cigarettes  . Last attempt to quit: 02/20/2016  . Years since quitting: 1.9  Smokeless Tobacco Never Used    Exercise Goals and Review: Exercise Goals    Row Name 01/23/18 1412             Exercise Goals   Increase Physical Activity  Yes       Intervention  Provide advice, education, support and counseling about physical activity/exercise needs.;Develop an individualized exercise prescription for aerobic and resistive training based on initial evaluation findings, risk stratification, comorbidities and participant's personal goals.       Expected Outcomes  Short Term: Attend rehab on a regular basis to increase amount of physical activity.;Long Term: Add in home exercise to make exercise part of routine and to increase amount of physical activity.;Long Term: Exercising regularly at least 3-5 days a week.       Increase Strength and Stamina  Yes       Intervention  Provide advice, education, support and counseling about physical activity/exercise needs.;Develop an individualized exercise prescription for aerobic and resistive training based on initial evaluation findings, risk stratification, comorbidities and participant's personal goals.       Expected Outcomes  Short Term: Increase workloads from initial exercise  prescription for resistance, speed, and METs.;Short Term: Perform resistance training exercises routinely during rehab and add in resistance training at home;Long Term: Improve cardiorespiratory fitness, muscular endurance and strength as measured by increased METs and functional capacity (6MWT)       Able to understand and use rate of perceived exertion (RPE) scale  Yes       Intervention  Provide education and explanation on how to use RPE scale       Expected Outcomes  Short Term: Able to use RPE daily in rehab to express subjective intensity level;Long Term:  Able to use RPE to guide intensity level when exercising independently       Able to understand and use Dyspnea scale  Yes       Intervention  Provide education and explanation on how to use Dyspnea scale       Expected Outcomes  Short Term: Able to use Dyspnea scale daily in rehab to express subjective sense of shortness of breath during exertion;Long Term: Able to use Dyspnea scale to guide intensity level when exercising independently       Knowledge and understanding of Target Heart Rate Range (THRR)  Yes       Intervention  Provide education and explanation of THRR including how the numbers were predicted and where they are located for reference       Expected Outcomes  Short Term: Able to state/look up THRR;Long Term: Able to use THRR to govern intensity when exercising independently;Short Term: Able to use daily as guideline for intensity in rehab       Able to check pulse independently  Yes       Intervention  Provide education and demonstration on how to check pulse in carotid and radial arteries.;Review the importance of being able to check your own pulse for safety during independent exercise       Expected Outcomes  Short Term: Able to explain why pulse checking is important during independent exercise;Long Term: Able to check pulse independently and accurately       Understanding of Exercise Prescription  Yes       Intervention   Provide education, explanation, and written materials on patient's individual exercise prescription       Expected Outcomes  Short Term: Able to explain program exercise prescription;Long Term: Able to explain home exercise prescription to exercise independently          Copy of goals given to participant.

## 2018-01-23 NOTE — Progress Notes (Signed)
Cardiac Individual Treatment Plan  Patient Details  Name: Stephen [name] MRN: [id number] Date of Birth: [birth year] Referring Provider:     Cardiac Rehab from 01/23/2018 in [facility] Cardiac and Pulmonary Rehab  Referring Provider  Arida      Initial Encounter Date:    Cardiac Rehab from 01/23/2018 in [facility] Cardiac and Pulmonary Rehab  Date  01/23/18  Referring Provider  Fletcher Anon      Visit Diagnosis: Heart failure, chronic systolic ([place])  Patient's Home Medications on Admission:  Current Outpatient Medications:  .  acetaminophen (TYLENOL) 500 MG tablet, Take 1 capsule by mouth every 4 (four) hours as needed for fever., Disp: , Rfl:  .  albuterol (PROVENTIL HFA;VENTOLIN HFA) 108 (90 Base) MCG/ACT inhaler, Inhale 2 puffs into the lungs every 4 (four) hours as needed for wheezing or shortness of breath., Disp: 1 Inhaler, Rfl: 0 .  amiodarone (PACERONE) 200 MG tablet, Take 1 tablet (200 mg total) by mouth daily. Take one 200 mg tablet twice a day for 1 week then take one tablet daily., Disp: 35 tablet, Rfl: 5 .  aspirin EC 81 MG EC tablet, Take 1 tablet (81 mg total) by mouth daily., Disp: 90 tablet, Rfl: 3 .  atorvastatin (LIPITOR) 40 MG tablet, Take 1 tablet (40 mg total) by mouth daily at 6 PM. (Patient taking differently: Take 40 mg by mouth daily. ), Disp: 30 tablet, Rfl: 2 .  carvedilol (COREG) 6.25 MG tablet, Take 1 tablet (6.25 mg total) by mouth 2 (two) times daily with a meal., Disp: 60 tablet, Rfl: 6 .  clopidogrel (PLAVIX) 75 MG tablet, Take 1 tablet (75 mg total) by mouth daily., Disp: 30 tablet, Rfl: 2 .  cyclobenzaprine (FLEXERIL) 10 MG tablet, Take 1 tablet (10 mg total) by mouth 3 (three) times daily as needed for muscle spasms., Disp: 20 tablet, Rfl: 0 .  diclofenac sodium (VOLTAREN) 1 % GEL, Apply 2 g topically 4 (four) times daily as needed (pain). , Disp: , Rfl:  .  docusate sodium (COLACE) 100 MG capsule, Take 1 capsule (100 mg total) by mouth 2 (two) times daily as needed  for mild constipation., Disp: 10 capsule, Rfl: 0 .  fluticasone (FLONASE) 50 MCG/ACT nasal spray, Place 2 sprays into both nostrils daily. (Patient taking differently: Place 2 sprays into both nostrils daily as needed for allergies. ), Disp: 16 g, Rfl: 2 .  Fluticasone-Salmeterol (ADVAIR DISKUS) 250-50 MCG/DOSE AEPB, Inhale 1 puff into the lungs 2 (two) times daily., Disp: 1 each, Rfl: 0 .  furosemide (LASIX) 40 MG tablet, Take 1 tablet (40 mg total) by mouth daily., Disp: 30 tablet, Rfl: 3 .  losartan (COZAAR) 25 MG tablet, Take 1 tablet (25 mg total) by mouth 2 (two) times daily., Disp: 60 tablet, Rfl: 5 .  nitroGLYCERIN (NITROSTAT) 0.4 MG SL tablet, Place 1 tablet (0.4 mg total) under the tongue every 5 (five) minutes as needed for chest pain., Disp: 30 tablet, Rfl: 0 .  pantoprazole (PROTONIX) 40 MG tablet, Take 1 tablet (40 mg total) by mouth daily., Disp: 30 tablet, Rfl: 0 .  polyethylene glycol (MIRALAX / GLYCOLAX) packet, Take 17 g by mouth daily. (Patient taking differently: Take 17 g by mouth daily as needed for mild constipation. ), Disp: 14 each, Rfl: 0 .  spironolactone (ALDACTONE) 25 MG tablet, Take 1 tablet (25 mg total) by mouth daily., Disp: 30 tablet, Rfl: 5 .  tiotropium (SPIRIVA HANDIHALER) 18 MCG inhalation capsule, Place 18 mcg daily into inhaler  and inhale. , Disp: , Rfl:  .  Camphor-Eucalyptus-Menthol (CHEST RUB) 4.8-1.2-2.6 % OINT, Apply 1 application topically daily as needed (congestino). , Disp: , Rfl:   Past Medical History: Past Medical History:  Diagnosis Date  . Arrhythmia   . Cardiac arrest ([place]) 12/23/2017   Brief V-fib arrest  . CHF (congestive heart failure) ([facility])    nonischemic cardiomyopathy, EF 25%  . Hypertension   . NICM (nonischemic cardiomyopathy) ([place])   . TIA (transient ischemic attack) 06/21/16    Tobacco Use: Social History   Tobacco Use  Smoking Status Former Smoker  . Packs/day: 0.25  . Years: 20.00  . Pack years: 5.00  . Types:  Cigarettes  . Last attempt to quit: 02/20/2016  . Years since quitting: 1.9  Smokeless Tobacco Never Used    Labs: Recent Review Flowsheet Data    Labs for ITP Cardiac and Pulmonary Rehab Latest Ref Rng & Units 12/24/2017 12/25/2017 12/26/2017 12/26/2017 12/27/2017   Cholestrol 0 - 200 mg/dL - - - - -   LDLCALC 0 - 99 mg/dL - - - - -   HDL >40 mg/dL - - - - -   Trlycerides <150 mg/dL - - - - -   PHART 7.350 - 7.450 - - - - -   PCO2ART 32.0 - 48.0 mmHg - - - - -   HCO3 20.0 - 28.0 mmol/L - - - - -   TCO2 22 - 32 mmol/L - - - - -   ACIDBASEDEF 0.0 - 2.0 mmol/L - - - - -   O2SAT % 66.9 70.3 72.5 54.9 57.2       Exercise Target Goals: Date: 01/23/18  Exercise Program Goal: Individual exercise prescription set using results from initial 6 min walk test and THRR while considering  patient's activity barriers and safety.   Exercise Prescription Goal: Initial exercise prescription builds to 30-45 minutes a day of aerobic activity, 2-3 days per week.  Home exercise guidelines will be given to patient during program as part of exercise prescription that the participant will acknowledge.  Activity Barriers & Risk Stratification: Activity Barriers & Cardiac Risk Stratification - 01/23/18 1354      Activity Barriers & Cardiac Risk Stratification   Activity Barriers  Joint Problems;Arthritis Both knees    Cardiac Risk Stratification  High       6 Minute Walk: 6 Minute Walk    Row Name 01/23/18 1412         6 Minute Walk   Distance  1050 feet     Walk Time  6 minutes     # of Rest Breaks  0     MPH  1.98     METS  3.07     RPE  11     Perceived Dyspnea   1     VO2 Peak  10.75     Symptoms  No     Resting HR  72 bpm     Resting BP  103/79     Resting Oxygen Saturation   98 %     Exercise Oxygen Saturation  during 6 min walk  97 %     Max Ex. HR  94 bpm     Max Ex. BP  112/68     2 Minute Post BP  96/56        Oxygen Initial Assessment:   Oxygen  Re-Evaluation:   Oxygen Discharge (Final Oxygen Re-Evaluation):   Initial Exercise Prescription: Initial Exercise  Prescription - 01/23/18 1400      Date of Initial Exercise RX and Referring Provider   Date  01/23/18    Referring Provider  Arida      Treadmill   MPH  2    Grade  1.5    Minutes  15    METs  2.85      Recumbant Bike   Level  2    RPM  60    Watts  37    Minutes  15    METs  3      REL-XR   Level  3    Speed  50    Minutes  15    METs  3      T5 Nustep   Level  2    SPM  80    Minutes  15    METs  3      Prescription Details   Frequency (times per week)  3    Duration  Progress to 30 minutes of continuous aerobic without signs/symptoms of physical distress      Intensity   THRR 40-80% of Max Heartrate  110-148    Ratings of Perceived Exertion  11-13    Perceived Dyspnea  0-4      Progression   Progression  Continue to progress workloads to maintain intensity without signs/symptoms of physical distress.      Resistance Training   Training Prescription  Yes    Weight  4 lb    Reps  10-15       Perform Capillary Blood Glucose checks as needed.  Exercise Prescription Changes: Exercise Prescription Changes    Row Name 01/23/18 1400             Response to Exercise   Blood Pressure (Admit)  103/79       Blood Pressure (Exercise)  112/68       Blood Pressure (Exit)  96/56       Heart Rate (Admit)  72 bpm       Heart Rate (Exercise)  94 bpm       Heart Rate (Exit)  71 bpm       Oxygen Saturation (Admit)  98 %       Oxygen Saturation (Exit)  97 %       Rating of Perceived Exertion (Exercise)  11          Exercise Comments:   Exercise Goals and Review: Exercise Goals    Row Name 01/23/18 1412             Exercise Goals   Increase Physical Activity  Yes       Intervention  Provide advice, education, support and counseling about physical activity/exercise needs.;Develop an individualized exercise prescription for aerobic  and resistive training based on initial evaluation findings, risk stratification, comorbidities and participant's personal goals.       Expected Outcomes  Short Term: Attend rehab on a regular basis to increase amount of physical activity.;Long Term: Add in home exercise to make exercise part of routine and to increase amount of physical activity.;Long Term: Exercising regularly at least 3-5 days a week.       Increase Strength and Stamina  Yes       Intervention  Provide advice, education, support and counseling about physical activity/exercise needs.;Develop an individualized exercise prescription for aerobic and resistive training based on initial evaluation findings, risk stratification, comorbidities and participant's personal goals.  Expected Outcomes  Short Term: Increase workloads from initial exercise prescription for resistance, speed, and METs.;Short Term: Perform resistance training exercises routinely during rehab and add in resistance training at home;Long Term: Improve cardiorespiratory fitness, muscular endurance and strength as measured by increased METs and functional capacity (6MWT)       Able to understand and use rate of perceived exertion (RPE) scale  Yes       Intervention  Provide education and explanation on how to use RPE scale       Expected Outcomes  Short Term: Able to use RPE daily in rehab to express subjective intensity level;Long Term:  Able to use RPE to guide intensity level when exercising independently       Able to understand and use Dyspnea scale  Yes       Intervention  Provide education and explanation on how to use Dyspnea scale       Expected Outcomes  Short Term: Able to use Dyspnea scale daily in rehab to express subjective sense of shortness of breath during exertion;Long Term: Able to use Dyspnea scale to guide intensity level when exercising independently       Knowledge and understanding of Target Heart Rate Range (THRR)  Yes       Intervention   Provide education and explanation of THRR including how the numbers were predicted and where they are located for reference       Expected Outcomes  Short Term: Able to state/look up THRR;Long Term: Able to use THRR to govern intensity when exercising independently;Short Term: Able to use daily as guideline for intensity in rehab       Able to check pulse independently  Yes       Intervention  Provide education and demonstration on how to check pulse in carotid and radial arteries.;Review the importance of being able to check your own pulse for safety during independent exercise       Expected Outcomes  Short Term: Able to explain why pulse checking is important during independent exercise;Long Term: Able to check pulse independently and accurately       Understanding of Exercise Prescription  Yes       Intervention  Provide education, explanation, and written materials on patient's individual exercise prescription       Expected Outcomes  Short Term: Able to explain program exercise prescription;Long Term: Able to explain home exercise prescription to exercise independently          Exercise Goals Re-Evaluation :   Discharge Exercise Prescription (Final Exercise Prescription Changes): Exercise Prescription Changes - 01/23/18 1400      Response to Exercise   Blood Pressure (Admit)  103/79    Blood Pressure (Exercise)  112/68    Blood Pressure (Exit)  96/56    Heart Rate (Admit)  72 bpm    Heart Rate (Exercise)  94 bpm    Heart Rate (Exit)  71 bpm    Oxygen Saturation (Admit)  98 %    Oxygen Saturation (Exit)  97 %    Rating of Perceived Exertion (Exercise)  11       Nutrition:  Target Goals: Understanding of nutrition guidelines, daily intake of sodium 1500mg , cholesterol 200mg , calories 30% from fat and 7% or less from saturated fats, daily to have 5 or more servings of fruits and vegetables.  Biometrics: Pre Biometrics - 01/23/18 1411      Pre Biometrics   Height  6' 0.75"  (1.848 m)  Weight  236 lb 11.2 oz (107.4 kg)    Waist Circumference  40 inches    Hip Circumference  44.5 inches    Waist to Hip Ratio  0.9 %    BMI (Calculated)  31.44    Single Leg Stand  17.5 seconds        Nutrition Therapy Plan and Nutrition Goals: Nutrition Therapy & Goals - 01/23/18 1347      Intervention Plan   Intervention  Prescribe, educate and counsel regarding individualized specific dietary modifications aiming towards targeted core components such as weight, hypertension, lipid management, diabetes, heart failure and other comorbidities.;Nutrition handout(s) given to patient.    Expected Outcomes  Short Term Goal: Understand basic principles of dietary content, such as calories, fat, sodium, cholesterol and nutrients.;Short Term Goal: A plan has been developed with personal nutrition goals set during dietitian appointment.;Long Term Goal: Adherence to prescribed nutrition plan.       Nutrition Assessments: Nutrition Assessments - 01/23/18 1141      MEDFICTS Scores   Pre Score  18       Nutrition Goals Re-Evaluation:   Nutrition Goals Discharge (Final Nutrition Goals Re-Evaluation):   Psychosocial: Target Goals: Acknowledge presence or absence of significant depression and/or stress, maximize coping skills, provide positive support system. Participant is able to verbalize types and ability to use techniques and skills needed for reducing stress and depression.   Initial Review & Psychosocial Screening: Initial Psych Review & Screening - 01/23/18 1348      Initial Review   Current issues with  Current Sleep Concerns;Current Stress Concerns    Source of Stress Concerns  Unable to participate in former interests or hobbies    Comments  [name] reports his unhealthy sleep habits are stemming from him being out of work temporarily and not on a usual routine. He is very motivated to work on his health and then go back to work.       Family Dynamics   Good Support  System?  Yes      Screening Interventions   Interventions  Encouraged to exercise;Program counselor consult;To provide support and resources with identified psychosocial needs    Expected Outcomes  Long Term Goal: Stressors or current issues are controlled or eliminated.;Short Term goal: Utilizing psychosocial counselor, staff and physician to assist with identification of specific Stressors or current issues interfering with healing process. Setting desired goal for each stressor or current issue identified.;Short Term goal: Identification and review with participant of any Quality of Life or Depression concerns found by scoring the questionnaire.;Long Term goal: The participant improves quality of Life and PHQ9 Scores as seen by post scores and/or verbalization of changes       Quality of Life Scores:  Quality of Life - 01/23/18 1141      Quality of Life Scores   Health/Function Pre  23.63 %    Socioeconomic Pre  27.5 %    Psych/Spiritual Pre  30 %    Family Pre  28.13 %    GLOBAL Pre  26.59 %      Scores of 19 and below usually indicate a poorer quality of life in these areas.  A difference of  2-3 points is a clinically meaningful difference.  A difference of 2-3 points in the total score of the Quality of Life Index has been associated with significant improvement in overall quality of life, self-image, physical symptoms, and general health in studies assessing change in quality of life.  PHQ-9: Recent  Review Flowsheet Data    Depression screen [facility] 2/9 01/23/2018 10/03/2017 09/30/2017 08/29/2017 07/02/2017   Decreased Interest 1 0 0 0 0   Down, Depressed, Hopeless 0 0 0 0 0   PHQ - 2 Score 1 0 0 0 0   Altered sleeping 2 - - - -   Tired, decreased energy 1 - - - -   Change in appetite 1 - - - -   Feeling bad or failure about yourself  0 - - - -   Trouble concentrating 0 - - - -   Moving slowly or fidgety/restless 0 - - - -   Suicidal thoughts 0 - - - -   PHQ-9 Score 5 - - - -    Difficult doing work/chores Not difficult at all - - - -     Interpretation of Total Score  Total Score Depression Severity:  1-4 = Minimal depression, 5-9 = Mild depression, 10-14 = Moderate depression, 15-19 = Moderately severe depression, 20-27 = Severe depression   Psychosocial Evaluation and Intervention:   Psychosocial Re-Evaluation:   Psychosocial Discharge (Final Psychosocial Re-Evaluation):   Vocational Rehabilitation: Provide vocational rehab assistance to qualifying candidates.   Vocational Rehab Evaluation & Intervention: Vocational Rehab - 01/23/18 1352      Initial Vocational Rehab Evaluation & Intervention   Assessment shows need for Vocational Rehabilitation  No       Education: Education Goals: Education classes will be provided on a variety of topics geared toward better understanding of heart health and risk factor modification. Participant will state understanding/return demonstration of topics presented as noted by education test scores.  Learning Barriers/Preferences: Learning Barriers/Preferences - 01/23/18 1413      Learning Barriers/Preferences   Learning Barriers  None    Learning Preferences  Individual Instruction;Group Instruction;Skilled Demonstration       Education Topics: General Nutrition Guidelines/Fats and Fiber: -Group instruction provided by verbal, written material, models and posters to present the general guidelines for heart healthy nutrition. Gives an explanation and review of dietary fats and fiber.   Controlling Sodium/Reading Food Labels: -Group verbal and written material supporting the discussion of sodium use in heart healthy nutrition. Review and explanation with models, verbal and written materials for utilization of the food label.   Exercise Physiology & Risk Factors: - Group verbal and written instruction with models to review the exercise physiology of the cardiovascular system and associated critical values.  Details cardiovascular disease risk factors and the goals associated with each risk factor.   Aerobic Exercise & Resistance Training: - Gives group verbal and written discussion on the health impact of inactivity. On the components of aerobic and resistive training programs and the benefits of this training and how to safely progress through these programs.   Flexibility, Balance, General Exercise Guidelines: - Provides group verbal and written instruction on the benefits of flexibility and balance training programs. Provides general exercise guidelines with specific guidelines to those with heart or lung disease. Demonstration and skill practice provided.   Stress Management: - Provides group verbal and written instruction about the health risks of elevated stress, cause of high stress, and healthy ways to reduce stress.   Depression: - Provides group verbal and written instruction on the correlation between heart/lung disease and depressed mood, treatment options, and the stigmas associated with seeking treatment.   Anatomy & Physiology of the Heart: - Group verbal and written instruction and models provide basic cardiac anatomy and physiology, with the coronary electrical and arterial  systems. Review of: AMI, Angina, Valve disease, Heart Failure, Cardiac Arrhythmia, Pacemakers, and the ICD.   Cardiac Procedures: - Group verbal and written instruction to review commonly prescribed medications for heart disease. Reviews the medication, class of the drug, and side effects. Includes the steps to properly store meds and maintain the prescription regimen. (beta blockers and nitrates)   Cardiac Medications I: - Group verbal and written instruction to review commonly prescribed medications for heart disease. Reviews the medication, class of the drug, and side effects. Includes the steps to properly store meds and maintain the prescription regimen.   Cardiac Medications II: -Group verbal  and written instruction to review commonly prescribed medications for heart disease. Reviews the medication, class of the drug, and side effects. (all other drug classes)    Go Sex-Intimacy & Heart Disease, Get SMART - Goal Setting: - Group verbal and written instruction through game format to discuss heart disease and the return to sexual intimacy. Provides group verbal and written material to discuss and apply goal setting through the application of the S.M.A.R.T. Method.   Other Matters of the Heart: - Provides group verbal, written materials and models to describe Heart Failure, Angina, Valve Disease, Peripheral Artery Disease, and Diabetes in the realm of heart disease. Includes description of the disease process and treatment options available to the cardiac patient.   Exercise & Equipment Safety: - Individual verbal instruction and demonstration of equipment use and safety with use of the equipment.   Cardiac Rehab from 01/23/2018 in [facility] Cardiac and Pulmonary Rehab  Date  01/23/18  Educator  [facility]  Instruction Review Code  1- Verbalizes Understanding      Infection Prevention: - Provides verbal and written material to individual with discussion of infection control including proper hand washing and proper equipment cleaning during exercise session.   Cardiac Rehab from 01/23/2018 in [facility] Cardiac and Pulmonary Rehab  Date  01/23/18  Educator  [facility]  Instruction Review Code  1- Verbalizes Understanding      Falls Prevention: - Provides verbal and written material to individual with discussion of falls prevention and safety.   Cardiac Rehab from 01/23/2018 in [facility] Cardiac and Pulmonary Rehab  Date  01/23/18  Educator  [facility]  Instruction Review Code  1- Verbalizes Understanding      Diabetes: - Individual verbal and written instruction to review signs/symptoms of diabetes, desired ranges of glucose level fasting, after meals and with exercise. Acknowledge that pre and post exercise  glucose checks will be done for 3 sessions at entry of program.   Other: -Provides group and verbal instruction on various topics (see comments)    Knowledge Questionnaire Score: Knowledge Questionnaire Score - 01/23/18 1141      Knowledge Questionnaire Score   Pre Score  21/28 correct answers reviewed with [name]       Core Components/Risk Factors/Patient Goals at Admission: Personal Goals and Risk Factors at Admission - 01/23/18 1342      Core Components/Risk Factors/Patient Goals on Admission    Weight Management  Yes;Weight Loss    Intervention  Weight Management: Develop a combined nutrition and exercise program designed to reach desired caloric intake, while maintaining appropriate intake of nutrient and fiber, sodium and fats, and appropriate energy expenditure required for the weight goal.;Weight Management: Provide education and appropriate resources to help participant work on and attain dietary goals.;Weight Management/Obesity: Establish reasonable short term and long term weight goals.    Admit Weight  230 lb (104.3 kg)  Goal Weight: Short Term  225 lb (102.1 kg)    Goal Weight: Long Term  225 lb (102.1 kg)    Expected Outcomes  Short Term: Continue to assess and modify interventions until short term weight is achieved;Long Term: Adherence to nutrition and physical activity/exercise program aimed toward attainment of established weight goal;Weight Maintenance: Understanding of the daily nutrition guidelines, which includes 25-35% calories from fat, 7% or less cal from saturated fats, less than 200mg  cholesterol, less than 1.5gm of sodium, & 5 or more servings of fruits and vegetables daily;Weight Loss: Understanding of general recommendations for a balanced deficit meal plan, which promotes 1-2 lb weight loss per week and includes a negative energy balance of [phone] kcal/d;Understanding recommendations for meals to include 15-35% energy as protein, 25-35% energy from fat, 35-60%  energy from carbohydrates, less than 200mg  of dietary cholesterol, 20-35 gm of total fiber daily;Understanding of distribution of calorie intake throughout the day with the consumption of 4-5 meals/snacks    Heart Failure  Yes    Intervention  Provide a combined exercise and nutrition program that is supplemented with education, support and counseling about heart failure. Directed toward relieving symptoms such as shortness of breath, decreased exercise tolerance, and extremity edema.    Expected Outcomes  Improve functional capacity of life;Short term: Attendance in program 2-3 days a week with increased exercise capacity. Reported lower sodium intake. Reported increased fruit and vegetable intake. Reports medication compliance.;Short term: Daily weights obtained and reported for increase. Utilizing diuretic protocols set by physician.;Long term: Adoption of self-care skills and reduction of barriers for early signs and symptoms recognition and intervention leading to self-care maintenance.    Lipids  Yes    Intervention  Provide education and support for participant on nutrition & aerobic/resistive exercise along with prescribed medications to achieve LDL 70mg , HDL >40mg .    Expected Outcomes  Short Term: Participant states understanding of desired cholesterol values and is compliant with medications prescribed. Participant is following exercise prescription and nutrition guidelines.;Long Term: Cholesterol controlled with medications as prescribed, with individualized exercise RX and with personalized nutrition plan. Value goals: LDL < 70mg , HDL > 40 mg.    Stress  Yes [name] is currently out of work until the end of February. He stated he does not want to go back to work until he is 100% better. His health is his main concern now.     Intervention  Offer individual and/or small group education and counseling on adjustment to heart disease, stress management and health-related lifestyle change. Teach and  support self-help strategies.;Refer participants experiencing significant psychosocial distress to appropriate mental health specialists for further evaluation and treatment. When possible, include family members and significant others in education/counseling sessions.    Expected Outcomes  Short Term: Participant demonstrates changes in health-related behavior, relaxation and other stress management skills, ability to obtain effective social support, and compliance with psychotropic medications if prescribed.;Long Term: Emotional wellbeing is indicated by absence of clinically significant psychosocial distress or social isolation.       Core Components/Risk Factors/Patient Goals Review:    Core Components/Risk Factors/Patient Goals at Discharge (Final Review):    ITP Comments: ITP Comments    Row Name 01/23/18 1332           ITP Comments  Med review completed. Initial ITP created. Diagnosis can be found [facility] 12/23/17          Comments: Initial ITP

## 2018-01-24 ENCOUNTER — Telehealth: Payer: Self-pay

## 2018-01-24 NOTE — Telephone Encounter (Signed)
-----   Message from Deboraha Sprang, MD sent at 01/22/2018  2:25 PM EST ----- Please Inform Patient that study was normal We will have to keep an eye on the pacing threshold    Thanks

## 2018-01-24 NOTE — Telephone Encounter (Signed)
Informed pts sister ( ok per DPR) that chest x -ray was normal. She stated that she would inform pt.

## 2018-01-28 ENCOUNTER — Telehealth: Payer: Self-pay | Admitting: Cardiovascular Disease

## 2018-01-28 ENCOUNTER — Telehealth: Payer: Self-pay

## 2018-01-28 NOTE — Telephone Encounter (Signed)
Pt c/o Shortness Of Breath: STAT if SOB developed within the last 24 hours or pt is noticeably SOB on the phone  1. Are you currently SOB (can you hear that pt is SOB on the phone)? No   2. How long have you been experiencing SOB? Since last night   3. Are you SOB when sitting or when up moving around? Just after eating   4. Are you currently experiencing any other symptoms? Headache

## 2018-01-28 NOTE — Telephone Encounter (Signed)
Transmission received, no VT episodes.  386 single PVCs per hour.   Attached are some example of the 5 episodes ranging from 60min to 59min

## 2018-01-28 NOTE — Telephone Encounter (Addendum)
Pt reports at 11:30pm last evening, he started feeling bad, HA, slight SOB. He had eaten a grilled chicken salad for dinner and thinks maybe that didn't agree with him. He had protonix prescription in June (#30, R: 0). Has not taken any in 6 months.  Slept well but began to feel worse after breakfast; laid down and felt better but sx returned when he had grape juice. When asked to described sx, he states he had no energy and felt SOB.  He felt better after a nap. Sx worsen after eating. He sent a remote check last evening, stating "it gave me a green check".  He had a sinus infection a week ago. Using saline nasal spray. No new medications. Denies SOB, chest, jaw or arm pain.  He is walking outside while talking to me on the phone. No sx, reports feeling "great".  He is concerned that BP may have been low [facility]. Notes show 103/79, 112/68 amd 96/56. He recently purchased BP cuff; will monitor and call if BP is consistently low. As he is feeling well at this time, advised to continue to monitor sx and call back if sx return. Will message device clinic as he sent a remote check last evening.

## 2018-01-29 ENCOUNTER — Encounter: Payer: Self-pay | Admitting: *Deleted

## 2018-01-29 DIAGNOSIS — I5022 Chronic systolic (congestive) heart failure: Secondary | ICD-10-CM

## 2018-01-29 NOTE — Telephone Encounter (Signed)
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

## 2018-01-29 NOTE — Progress Notes (Signed)
Cardiac Individual Treatment Plan  Patient Details  Name: Stephen [name] MRN: [id number] Date of Birth: February 21, 1966 Referring Provider:     Cardiac Rehab from 01/23/2018 in [facility] Cardiac and Pulmonary Rehab  Referring Provider  Arida      Initial Encounter Date:    Cardiac Rehab from 01/23/2018 in [facility] Cardiac and Pulmonary Rehab  Date  01/23/18  Referring Provider  Fletcher Anon      Visit Diagnosis: Heart failure, chronic systolic ([place])  Patient's Home Medications on Admission:  Current Outpatient Medications:  .  acetaminophen (TYLENOL) 500 MG tablet, Take 1 capsule by mouth every 4 (four) hours as needed for fever., Disp: , Rfl:  .  albuterol (PROVENTIL HFA;VENTOLIN HFA) 108 (90 Base) MCG/ACT inhaler, Inhale 2 puffs into the lungs every 4 (four) hours as needed for wheezing or shortness of breath., Disp: 1 Inhaler, Rfl: 0 .  amiodarone (PACERONE) 200 MG tablet, Take 1 tablet (200 mg total) by mouth daily. Take one 200 mg tablet twice a day for 1 week then take one tablet daily., Disp: 35 tablet, Rfl: 5 .  aspirin EC 81 MG EC tablet, Take 1 tablet (81 mg total) by mouth daily., Disp: 90 tablet, Rfl: 3 .  atorvastatin (LIPITOR) 40 MG tablet, Take 1 tablet (40 mg total) by mouth daily at 6 PM. (Patient taking differently: Take 40 mg by mouth daily. ), Disp: 30 tablet, Rfl: 2 .  Camphor-Eucalyptus-Menthol (CHEST RUB) 4.8-1.2-2.6 % OINT, Apply 1 application topically daily as needed (congestino). , Disp: , Rfl:  .  carvedilol (COREG) 6.25 MG tablet, Take 1 tablet (6.25 mg total) by mouth 2 (two) times daily with a meal., Disp: 60 tablet, Rfl: 6 .  clopidogrel (PLAVIX) 75 MG tablet, Take 1 tablet (75 mg total) by mouth daily., Disp: 30 tablet, Rfl: 2 .  cyclobenzaprine (FLEXERIL) 10 MG tablet, Take 1 tablet (10 mg total) by mouth 3 (three) times daily as needed for muscle spasms., Disp: 20 tablet, Rfl: 0 .  diclofenac sodium (VOLTAREN) 1 % GEL, Apply 2 g topically 4 (four) times daily as needed  (pain). , Disp: , Rfl:  .  docusate sodium (COLACE) 100 MG capsule, Take 1 capsule (100 mg total) by mouth 2 (two) times daily as needed for mild constipation., Disp: 10 capsule, Rfl: 0 .  fluticasone (FLONASE) 50 MCG/ACT nasal spray, Place 2 sprays into both nostrils daily. (Patient taking differently: Place 2 sprays into both nostrils daily as needed for allergies. ), Disp: 16 g, Rfl: 2 .  Fluticasone-Salmeterol (ADVAIR DISKUS) 250-50 MCG/DOSE AEPB, Inhale 1 puff into the lungs 2 (two) times daily., Disp: 1 each, Rfl: 0 .  furosemide (LASIX) 40 MG tablet, Take 1 tablet (40 mg total) by mouth daily., Disp: 30 tablet, Rfl: 3 .  losartan (COZAAR) 25 MG tablet, Take 1 tablet (25 mg total) by mouth 2 (two) times daily., Disp: 60 tablet, Rfl: 5 .  nitroGLYCERIN (NITROSTAT) 0.4 MG SL tablet, Place 1 tablet (0.4 mg total) under the tongue every 5 (five) minutes as needed for chest pain., Disp: 30 tablet, Rfl: 0 .  pantoprazole (PROTONIX) 40 MG tablet, Take 1 tablet (40 mg total) by mouth daily., Disp: 30 tablet, Rfl: 0 .  polyethylene glycol (MIRALAX / GLYCOLAX) packet, Take 17 g by mouth daily. (Patient taking differently: Take 17 g by mouth daily as needed for mild constipation. ), Disp: 14 each, Rfl: 0 .  spironolactone (ALDACTONE) 25 MG tablet, Take 1 tablet (25 mg total) by mouth  daily., Disp: 30 tablet, Rfl: 5 .  tiotropium (SPIRIVA HANDIHALER) 18 MCG inhalation capsule, Place 18 mcg daily into inhaler and inhale. , Disp: , Rfl:   Past Medical History: Past Medical History:  Diagnosis Date  . Arrhythmia   . Cardiac arrest ([place]) 12/23/2017   Brief V-fib arrest  . CHF (congestive heart failure) (H[facility])    nonischemic cardiomyopathy, EF 25%  . Hypertension   . NICM (nonischemic cardiomyopathy) ([place])   . TIA (transient ischemic attack) 06/21/16    Tobacco Use: Social History   Tobacco Use  Smoking Status Former Smoker  . Packs/day: 0.25  . Years: 20.00  . Pack years: 5.00  . Types:  Cigarettes  . Last attempt to quit: 02/20/2016  . Years since quitting: 1.9  Smokeless Tobacco Never Used    Labs: Recent Review Flowsheet Data    Labs for ITP Cardiac and Pulmonary Rehab Latest Ref Rng & Units 12/24/2017 12/25/2017 12/26/2017 12/26/2017 12/27/2017   Cholestrol 0 - 200 mg/dL - - - - -   LDLCALC 0 - 99 mg/dL - - - - -   HDL >40 mg/dL - - - - -   Trlycerides <150 mg/dL - - - - -   PHART 7.350 - 7.450 - - - - -   PCO2ART 32.0 - 48.0 mmHg - - - - -   HCO3 20.0 - 28.0 mmol/L - - - - -   TCO2 22 - 32 mmol/L - - - - -   ACIDBASEDEF 0.0 - 2.0 mmol/L - - - - -   O2SAT % 66.9 70.3 72.5 54.9 57.2       Exercise Target Goals:    Exercise Program Goal: Individual exercise prescription set using results from initial 6 min walk test and THRR while considering  patient's activity barriers and safety.   Exercise Prescription Goal: Initial exercise prescription builds to 30-45 minutes a day of aerobic activity, 2-3 days per week.  Home exercise guidelines will be given to patient during program as part of exercise prescription that the participant will acknowledge.  Activity Barriers & Risk Stratification: Activity Barriers & Cardiac Risk Stratification - 01/23/18 1354      Activity Barriers & Cardiac Risk Stratification   Activity Barriers  Joint Problems;Arthritis Both knees    Cardiac Risk Stratification  High       6 Minute Walk: 6 Minute Walk    Row Name 01/23/18 1412         6 Minute Walk   Distance  1050 feet     Walk Time  6 minutes     # of Rest Breaks  0     MPH  1.98     METS  3.07     RPE  11     Perceived Dyspnea   1     VO2 Peak  10.75     Symptoms  No     Resting HR  72 bpm     Resting BP  103/79     Resting Oxygen Saturation   98 %     Exercise Oxygen Saturation  during 6 min walk  97 %     Max Ex. HR  94 bpm     Max Ex. BP  112/68     2 Minute Post BP  96/56        Oxygen Initial Assessment:   Oxygen Re-Evaluation:   Oxygen  Discharge (Final Oxygen Re-Evaluation):   Initial Exercise Prescription: Initial Exercise  Prescription - 01/23/18 1400      Date of Initial Exercise RX and Referring Provider   Date  01/23/18    Referring Provider  Arida      Treadmill   MPH  2    Grade  1.5    Minutes  15    METs  2.85      Recumbant Bike   Level  2    RPM  60    Watts  37    Minutes  15    METs  3      REL-XR   Level  3    Speed  50    Minutes  15    METs  3      T5 Nustep   Level  2    SPM  80    Minutes  15    METs  3      Prescription Details   Frequency (times per week)  3    Duration  Progress to 30 minutes of continuous aerobic without signs/symptoms of physical distress      Intensity   THRR 40-80% of Max Heartrate  110-148    Ratings of Perceived Exertion  11-13    Perceived Dyspnea  0-4      Progression   Progression  Continue to progress workloads to maintain intensity without signs/symptoms of physical distress.      Resistance Training   Training Prescription  Yes    Weight  4 lb    Reps  10-15       Perform Capillary Blood Glucose checks as needed.  Exercise Prescription Changes: Exercise Prescription Changes    Row Name 01/23/18 1400             Response to Exercise   Blood Pressure (Admit)  103/79       Blood Pressure (Exercise)  112/68       Blood Pressure (Exit)  96/56       Heart Rate (Admit)  72 bpm       Heart Rate (Exercise)  94 bpm       Heart Rate (Exit)  71 bpm       Oxygen Saturation (Admit)  98 %       Oxygen Saturation (Exit)  97 %       Rating of Perceived Exertion (Exercise)  11          Exercise Comments:   Exercise Goals and Review: Exercise Goals    Row Name 01/23/18 1412             Exercise Goals   Increase Physical Activity  Yes       Intervention  Provide advice, education, support and counseling about physical activity/exercise needs.;Develop an individualized exercise prescription for aerobic and resistive training based  on initial evaluation findings, risk stratification, comorbidities and participant's personal goals.       Expected Outcomes  Short Term: Attend rehab on a regular basis to increase amount of physical activity.;Long Term: Add in home exercise to make exercise part of routine and to increase amount of physical activity.;Long Term: Exercising regularly at least 3-5 days a week.       Increase Strength and Stamina  Yes       Intervention  Provide advice, education, support and counseling about physical activity/exercise needs.;Develop an individualized exercise prescription for aerobic and resistive training based on initial evaluation findings, risk stratification, comorbidities and participant's personal goals.  Expected Outcomes  Short Term: Increase workloads from initial exercise prescription for resistance, speed, and METs.;Short Term: Perform resistance training exercises routinely during rehab and add in resistance training at home;Long Term: Improve cardiorespiratory fitness, muscular endurance and strength as measured by increased METs and functional capacity (6MWT)       Able to understand and use rate of perceived exertion (RPE) scale  Yes       Intervention  Provide education and explanation on how to use RPE scale       Expected Outcomes  Short Term: Able to use RPE daily in rehab to express subjective intensity level;Long Term:  Able to use RPE to guide intensity level when exercising independently       Able to understand and use Dyspnea scale  Yes       Intervention  Provide education and explanation on how to use Dyspnea scale       Expected Outcomes  Short Term: Able to use Dyspnea scale daily in rehab to express subjective sense of shortness of breath during exertion;Long Term: Able to use Dyspnea scale to guide intensity level when exercising independently       Knowledge and understanding of Target Heart Rate Range (THRR)  Yes       Intervention  Provide education and explanation  of THRR including how the numbers were predicted and where they are located for reference       Expected Outcomes  Short Term: Able to state/look up THRR;Long Term: Able to use THRR to govern intensity when exercising independently;Short Term: Able to use daily as guideline for intensity in rehab       Able to check pulse independently  Yes       Intervention  Provide education and demonstration on how to check pulse in carotid and radial arteries.;Review the importance of being able to check your own pulse for safety during independent exercise       Expected Outcomes  Short Term: Able to explain why pulse checking is important during independent exercise;Long Term: Able to check pulse independently and accurately       Understanding of Exercise Prescription  Yes       Intervention  Provide education, explanation, and written materials on patient's individual exercise prescription       Expected Outcomes  Short Term: Able to explain program exercise prescription;Long Term: Able to explain home exercise prescription to exercise independently          Exercise Goals Re-Evaluation :   Discharge Exercise Prescription (Final Exercise Prescription Changes): Exercise Prescription Changes - 01/23/18 1400      Response to Exercise   Blood Pressure (Admit)  103/79    Blood Pressure (Exercise)  112/68    Blood Pressure (Exit)  96/56    Heart Rate (Admit)  72 bpm    Heart Rate (Exercise)  94 bpm    Heart Rate (Exit)  71 bpm    Oxygen Saturation (Admit)  98 %    Oxygen Saturation (Exit)  97 %    Rating of Perceived Exertion (Exercise)  11       Nutrition:  Target Goals: Understanding of nutrition guidelines, daily intake of sodium <1535m, cholesterol <2038m calories 30% from fat and 7% or less from saturated fats, daily to have 5 or more servings of fruits and vegetables.  Biometrics: Pre Biometrics - 01/23/18 1411      Pre Biometrics   Height  6' 0.75" (1.848 m)    Weight  236 lb 11.2 oz  (107.4 kg)    Waist Circumference  40 inches    Hip Circumference  44.5 inches    Waist to Hip Ratio  0.9 %    BMI (Calculated)  31.44    Single Leg Stand  17.5 seconds        Nutrition Therapy Plan and Nutrition Goals: Nutrition Therapy & Goals - 01/23/18 1347      Intervention Plan   Intervention  Prescribe, educate and counsel regarding individualized specific dietary modifications aiming towards targeted core components such as weight, hypertension, lipid management, diabetes, heart failure and other comorbidities.;Nutrition handout(s) given to patient.    Expected Outcomes  Short Term Goal: Understand basic principles of dietary content, such as calories, fat, sodium, cholesterol and nutrients.;Short Term Goal: A plan has been developed with personal nutrition goals set during dietitian appointment.;Long Term Goal: Adherence to prescribed nutrition plan.       Nutrition Assessments: Nutrition Assessments - 01/23/18 1141      MEDFICTS Scores   Pre Score  18       Nutrition Goals Re-Evaluation:   Nutrition Goals Discharge (Final Nutrition Goals Re-Evaluation):   Psychosocial: Target Goals: Acknowledge presence or absence of significant depression and/or stress, maximize coping skills, provide positive support system. Participant is able to verbalize types and ability to use techniques and skills needed for reducing stress and depression.   Initial Review & Psychosocial Screening: Initial Psych Review & Screening - 01/23/18 1348      Initial Review   Current issues with  Current Sleep Concerns;Current Stress Concerns    Source of Stress Concerns  Unable to participate in former interests or hobbies    Comments  [name] reports his unhealthy sleep habits are stemming from him being out of work temporarily and not on a usual routine. He is very motivated to work on his health and then go back to work.       Family Dynamics   Good Support System?  Yes      Screening  Interventions   Interventions  Encouraged to exercise;Program counselor consult;To provide support and resources with identified psychosocial needs    Expected Outcomes  Long Term Goal: Stressors or current issues are controlled or eliminated.;Short Term goal: Utilizing psychosocial counselor, staff and physician to assist with identification of specific Stressors or current issues interfering with healing process. Setting desired goal for each stressor or current issue identified.;Short Term goal: Identification and review with participant of any Quality of Life or Depression concerns found by scoring the questionnaire.;Long Term goal: The participant improves quality of Life and PHQ9 Scores as seen by post scores and/or verbalization of changes       Quality of Life Scores:  Quality of Life - 01/23/18 1141      Quality of Life Scores   Health/Function Pre  23.63 %    Socioeconomic Pre  27.5 %    Psych/Spiritual Pre  30 %    Family Pre  28.13 %    GLOBAL Pre  26.59 %      Scores of 19 and below usually indicate a poorer quality of life in these areas.  A difference of  2-3 points is a clinically meaningful difference.  A difference of 2-3 points in the total score of the Quality of Life Index has been associated with significant improvement in overall quality of life, self-image, physical symptoms, and general health in studies assessing change in quality of life.  PHQ-9: Recent Review  Flowsheet Data    Depression screen [facility] 2/9 01/23/2018 10/03/2017 09/30/2017 08/29/2017 07/02/2017   Decreased Interest 1 0 0 0 0   Down, Depressed, Hopeless 0 0 0 0 0   PHQ - 2 Score 1 0 0 0 0   Altered sleeping 2 - - - -   Tired, decreased energy 1 - - - -   Change in appetite 1 - - - -   Feeling bad or failure about yourself  0 - - - -   Trouble concentrating 0 - - - -   Moving slowly or fidgety/restless 0 - - - -   Suicidal thoughts 0 - - - -   PHQ-9 Score 5 - - - -   Difficult doing work/chores Not  difficult at all - - - -     Interpretation of Total Score  Total Score Depression Severity:  1-4 = Minimal depression, 5-9 = Mild depression, 10-14 = Moderate depression, 15-19 = Moderately severe depression, 20-27 = Severe depression   Psychosocial Evaluation and Intervention:   Psychosocial Re-Evaluation:   Psychosocial Discharge (Final Psychosocial Re-Evaluation):   Vocational Rehabilitation: Provide vocational rehab assistance to qualifying candidates.   Vocational Rehab Evaluation & Intervention: Vocational Rehab - 01/23/18 1352      Initial Vocational Rehab Evaluation & Intervention   Assessment shows need for Vocational Rehabilitation  No       Education: Education Goals: Education classes will be provided on a variety of topics geared toward better understanding of heart health and risk factor modification. Participant will state understanding/return demonstration of topics presented as noted by education test scores.  Learning Barriers/Preferences: Learning Barriers/Preferences - 01/23/18 1413      Learning Barriers/Preferences   Learning Barriers  None    Learning Preferences  Individual Instruction;Group Instruction;Skilled Demonstration       Education Topics:  AED/CPR: - Group verbal and written instruction with the use of models to demonstrate the basic use of the AED with the basic ABC's of resuscitation.   General Nutrition Guidelines/Fats and Fiber: -Group instruction provided by verbal, written material, models and posters to present the general guidelines for heart healthy nutrition. Gives an explanation and review of dietary fats and fiber.   Controlling Sodium/Reading Food Labels: -Group verbal and written material supporting the discussion of sodium use in heart healthy nutrition. Review and explanation with models, verbal and written materials for utilization of the food label.   Exercise Physiology & General Exercise Guidelines: - Group  verbal and written instruction with models to review the exercise physiology of the cardiovascular system and associated critical values. Provides general exercise guidelines with specific guidelines to those with heart or lung disease.    Aerobic Exercise & Resistance Training: - Gives group verbal and written instruction on the various components of exercise. Focuses on aerobic and resistive training programs and the benefits of this training and how to safely progress through these programs..   Flexibility, Balance, Mind/Body Relaxation: Provides group verbal/written instruction on the benefits of flexibility and balance training, including mind/body exercise modes such as yoga, pilates and tai chi.  Demonstration and skill practice provided.   Stress and Anxiety: - Provides group verbal and written instruction about the health risks of elevated stress and causes of high stress.  Discuss the correlation between heart/lung disease and anxiety and treatment options. Review healthy ways to manage with stress and anxiety.   Depression: - Provides group verbal and written instruction on the correlation between heart/lung disease and  depressed mood, treatment options, and the stigmas associated with seeking treatment.   Anatomy & Physiology of the Heart: - Group verbal and written instruction and models provide basic cardiac anatomy and physiology, with the coronary electrical and arterial systems. Review of Valvular disease and Heart Failure   Cardiac Procedures: - Group verbal and written instruction to review commonly prescribed medications for heart disease. Reviews the medication, class of the drug, and side effects. Includes the steps to properly store meds and maintain the prescription regimen. (beta blockers and nitrates)   Cardiac Medications I: - Group verbal and written instruction to review commonly prescribed medications for heart disease. Reviews the medication, class of the  drug, and side effects. Includes the steps to properly store meds and maintain the prescription regimen.   Cardiac Medications II: -Group verbal and written instruction to review commonly prescribed medications for heart disease. Reviews the medication, class of the drug, and side effects. (all other drug classes)    Go Sex-Intimacy & Heart Disease, Get SMART - Goal Setting: - Group verbal and written instruction through game format to discuss heart disease and the return to sexual intimacy. Provides group verbal and written material to discuss and apply goal setting through the application of the S.M.A.R.T. Method.   Other Matters of the Heart: - Provides group verbal, written materials and models to describe Stable Angina and Peripheral Artery. Includes description of the disease process and treatment options available to the cardiac patient.   Exercise & Equipment Safety: - Individual verbal instruction and demonstration of equipment use and safety with use of the equipment.   Cardiac Rehab from 01/23/2018 in [facility] Cardiac and Pulmonary Rehab  Date  01/23/18  Educator  [facility]  Instruction Review Code  1- Verbalizes Understanding      Infection Prevention: - Provides verbal and written material to individual with discussion of infection control including proper hand washing and proper equipment cleaning during exercise session.   Cardiac Rehab from 01/23/2018 in [facility] Cardiac and Pulmonary Rehab  Date  01/23/18  Educator  [facility]  Instruction Review Code  1- Verbalizes Understanding      Falls Prevention: - Provides verbal and written material to individual with discussion of falls prevention and safety.   Cardiac Rehab from 01/23/2018 in [facility] Cardiac and Pulmonary Rehab  Date  01/23/18  Educator  [facility]  Instruction Review Code  1- Verbalizes Understanding      Diabetes: - Individual verbal and written instruction to review signs/symptoms of diabetes, desired ranges of glucose level  fasting, after meals and with exercise. Acknowledge that pre and post exercise glucose checks will be done for 3 sessions at entry of program.   Know Your Numbers and Risk Factors: -Group verbal and written instruction about important numbers in your health.  Discussion of what are risk factors and how they play a role in the disease process.  Review of Cholesterol, Blood Pressure, Diabetes, and BMI and the role they play in your overall health.   Sleep Hygiene: -Provides group verbal and written instruction about how sleep can affect your health.  Define sleep hygiene, discuss sleep cycles and impact of sleep habits. Review good sleep hygiene tips.    Other: -Provides group and verbal instruction on various topics (see comments)   Knowledge Questionnaire Score: Knowledge Questionnaire Score - 01/23/18 1141      Knowledge Questionnaire Score   Pre Score  21/28 correct answers reviewed with [name]       Core Components/Risk Factors/Patient Goals  at Admission: Personal Goals and Risk Factors at Admission - 01/23/18 1342      Core Components/Risk Factors/Patient Goals on Admission    Weight Management  Yes;Weight Loss    Intervention  Weight Management: Develop a combined nutrition and exercise program designed to reach desired caloric intake, while maintaining appropriate intake of nutrient and fiber, sodium and fats, and appropriate energy expenditure required for the weight goal.;Weight Management: Provide education and appropriate resources to help participant work on and attain dietary goals.;Weight Management/Obesity: Establish reasonable short term and long term weight goals.    Admit Weight  230 lb (104.3 kg)    Goal Weight: Short Term  225 lb (102.1 kg)    Goal Weight: Long Term  225 lb (102.1 kg)    Expected Outcomes  Short Term: Continue to assess and modify interventions until short term weight is achieved;Long Term: Adherence to nutrition and physical activity/exercise program  aimed toward attainment of established weight goal;Weight Maintenance: Understanding of the daily nutrition guidelines, which includes 25-35% calories from fat, 7% or less cal from saturated fats, less than 221m cholesterol, less than 1.5gm of sodium, & 5 or more servings of fruits and vegetables daily;Weight Loss: Understanding of general recommendations for a balanced deficit meal plan, which promotes 1-2 lb weight loss per week and includes a negative energy balance of [phone] kcal/d;Understanding recommendations for meals to include 15-35% energy as protein, 25-35% energy from fat, 35-60% energy from carbohydrates, less than 2035mof dietary cholesterol, 20-35 gm of total fiber daily;Understanding of distribution of calorie intake throughout the day with the consumption of 4-5 meals/snacks    Heart Failure  Yes    Intervention  Provide a combined exercise and nutrition program that is supplemented with education, support and counseling about heart failure. Directed toward relieving symptoms such as shortness of breath, decreased exercise tolerance, and extremity edema.    Expected Outcomes  Improve functional capacity of life;Short term: Attendance in program 2-3 days a week with increased exercise capacity. Reported lower sodium intake. Reported increased fruit and vegetable intake. Reports medication compliance.;Short term: Daily weights obtained and reported for increase. Utilizing diuretic protocols set by physician.;Long term: Adoption of self-care skills and reduction of barriers for early signs and symptoms recognition and intervention leading to self-care maintenance.    Lipids  Yes    Intervention  Provide education and support for participant on nutrition & aerobic/resistive exercise along with prescribed medications to achieve LDL <7073mHDL >21m57m  Expected Outcomes  Short Term: Participant states understanding of desired cholesterol values and is compliant with medications prescribed.  Participant is following exercise prescription and nutrition guidelines.;Long Term: Cholesterol controlled with medications as prescribed, with individualized exercise RX and with personalized nutrition plan. Value goals: LDL < 70mg80mL > 40 mg.    Stress  Yes [name] is currently out of work until the end of February. He stated he does not want to go back to work until he is 100% better. His health is his main concern now.     Intervention  Offer individual and/or small group education and counseling on adjustment to heart disease, stress management and health-related lifestyle change. Teach and support self-help strategies.;Refer participants experiencing significant psychosocial distress to appropriate mental health specialists for further evaluation and treatment. When possible, include family members and significant others in education/counseling sessions.    Expected Outcomes  Short Term: Participant demonstrates changes in health-related behavior, relaxation and other stress management skills, ability to obtain effective social  support, and compliance with psychotropic medications if prescribed.;Long Term: Emotional wellbeing is indicated by absence of clinically significant psychosocial distress or social isolation.       Core Components/Risk Factors/Patient Goals Review:    Core Components/Risk Factors/Patient Goals at Discharge (Final Review):    ITP Comments: ITP Comments    Row Name 01/23/18 1332 01/29/18 0623         ITP Comments  Med review completed. Initial ITP created. Diagnosis can be found CHL 12/23/17  30 Day review. Continue with ITP unless directed changes per Medical Director review.  Starts sessions on 1/31         Comments:

## 2018-01-30 ENCOUNTER — Ambulatory Visit (HOSPITAL_COMMUNITY)
Admission: RE | Admit: 2018-01-30 | Discharge: 2018-01-30 | Disposition: A | Discharge status: Home / Self Care | Payer: Commercial Managed Care - PPO | Source: Ambulatory Visit | Attending: Cardiology | Admitting: Cardiology

## 2018-01-30 ENCOUNTER — Encounter: Payer: Commercial Managed Care - PPO | Admitting: *Deleted

## 2018-01-30 DIAGNOSIS — I5022 Chronic systolic (congestive) heart failure: Secondary | ICD-10-CM | POA: Diagnosis present

## 2018-01-30 DIAGNOSIS — Z8674 Personal history of sudden cardiac arrest: Secondary | ICD-10-CM | POA: Diagnosis not present

## 2018-01-30 DIAGNOSIS — I493 Ventricular premature depolarization: Secondary | ICD-10-CM | POA: Insufficient documentation

## 2018-01-30 DIAGNOSIS — R51 Headache: Secondary | ICD-10-CM | POA: Diagnosis not present

## 2018-01-30 DIAGNOSIS — Z95810 Presence of automatic (implantable) cardiac defibrillator: Secondary | ICD-10-CM | POA: Diagnosis not present

## 2018-01-30 DIAGNOSIS — N183 Chronic kidney disease, stage 3 (moderate): Secondary | ICD-10-CM | POA: Diagnosis not present

## 2018-01-30 MED ORDER — CLOPIDOGREL BISULFATE 75 MG PO TABS: 75.0000 mg | ORAL_TABLET | Freq: Every day | ORAL | 5 refills | Status: DC

## 2018-01-30 MED ORDER — SACUBITRIL-VALSARTAN 24-26 MG PO TABS: 1.0000 | ORAL_TABLET | Freq: Two times a day (BID) | ORAL | 5 refills | Status: DC

## 2018-01-30 NOTE — Progress Notes (Signed)
HF MD: Haroldine Laws  HPI:  Stephen [name] is a 52 y.o. 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   Admitted 12/24 -> 12/31/17. Pt presented to [facility], but transferred to [facility] for management of worsening HF, Shock, Influenzae A, and VF arrest. Swan placed. Started on NE and milrinone with improved output. Pt had multiple episodes of VT so weaned of pressors as tolerated. S/p ICD 12/30/17 Medtronic. Further work up as below including cMRI.   Pt presents today for pharmacist-led HF medication titration. At last HF clinic visit on 01/09/18, his carvedilol was increased to 6.25 mg BID. Digoxin d/c'd by [name] on 01/17/18 since he felt there was no need for it at this time. Feeling great overall. Denies SOB with ADLs, slowly getting energy back and just started cardiac rehab. He does complain of intermittent headaches for which he will see his PCP. He did switch back to torsemide about a week ago because he felt like he wasn't urinating much on furosemide. Weight at home has been stable. Living at home with wife who helps with medication. Denies orthopnea, lightheadedness or dizziness. No CP. Taking all medications as directed.     . Shortness of breath/dyspnea on exertion? No  . Orthopnea/PND? No . Edema? No . Lightheadedness/dizziness? No . Daily weights at home? Yes - stable ~231 lb  . Blood pressure/heart rate monitoring at home? No - just bought automatic blood pressure cuff yesterday  . Following low-sodium/fluid-restricted diet? Yes   HF Medications: Carvedilol 6.25 mg PO BID Losartan 25 mg PO BID Spironolactone 25 mg PO daily  Torsemide 40 QAM/20 QPM  Has the patient been experiencing any side effects to the medications prescribed?  no  Does the patient have any problems obtaining medications due to transportation or finances?   No - [employer] commercial   Understanding of regimen: fair Understanding of indications: fair Potential of compliance: good Patient  understands to avoid NSAIDs. Patient understands to avoid decongestants.    Pertinent Lab Values: . 01/03/18: Serum creatinine 1.21 (BL ~1.2-1.3), BUN 27, Potassium 4.9, Sodium 138, Digoxin 0.7  . 12/31/17:  Magnesium 2.2  Vital Signs: . Weight: 239 lb (dry weight: 230-235 lb) . Blood pressure: 122/80 mmHg  . Heart rate: 64 bpm    ReDS Vest - 01/30/18 1300      ReDS Vest   MR   No    Estimated volume prior to reading  Low    Fitting Posture  Standing    Height Marker  Tall    Ruler Value  8.5    Center Strip  Shifted    ReDS Value  28        Assessment: 1. Chronicsystolic CHF (EF 95%), due to NICM. NYHA class II-IIIsymptoms. - Volume status looks stable on exam. ReDs vest 28%  - Switch losartan to Entresto 24-26 mg BID and recheck serum K in 1 week  - Continue carvedilol 6.25 mg BID, torsemide 40 mg QAM and 20 mg QPM and spironolactone 25 mg daily - Advised to monitor blood pressure closely at home with restart Delene Loll  - Basic disease state pathophysiology, medication indication, mechanism and side effects reviewed at length with patient and he verbalized understanding 2. VF arrest on 12/24 at [facility]: S/p defibrillation x 1. Multiple runs VT overnight into 12/24/17. Improved with decreasing norepi and starting IV amio. Now has Medtronic ICD. - No VT/VF on ICD interrogation. Follow with [name]  - Continue po amiodarone and coreg for  now - No driving x 6 months. Pt aware.  3. CKD III: - BMET stable 01/03/18, repeat in 1 week  4. Frequent PVCs:  - Continue amidoarone 200 mg daily and carvedilol as above  Plan: 1) Medication changes: Based on clinical presentation, vital signs and recent labs will switch from losartan to Entresto 24-26 mg BID 2) Labs: BMET in 1 week at [facility] 3) Follow-up: Dr. Haroldine Laws on 02/19/18    Ruta Hinds. Velva Harman, PharmD, BCPS, CPP Clinical Pharmacist Pager: [phone] Phone: [phone] 01/30/2018 10:44 AM

## 2018-01-30 NOTE — Progress Notes (Signed)
Daily Session Note  Patient Details  Name: Stephen [name] MRN: [id number] Date of Birth: Jun 23, 1966 Referring Provider:     Cardiac Rehab from 01/23/2018 in [facility] Cardiac and Pulmonary Rehab  Referring Provider  Arida      Encounter Date: 01/30/2018  Check In: Session Check In - 01/30/18 0905      Check-In   Location  [facility]-Cardiac & Pulmonary Rehab    Staff Present  Alberteen Sam, MA, RCEP, CCRP, Exercise Physiologist;Amanda Oletta Darter, BA, ACSM CEP, Exercise Physiologist;Carroll Enterkin, RN, BSN    Supervising physician immediately available to respond to emergencies  See telemetry face sheet for immediately available ER MD    Medication changes reported      No    Fall or balance concerns reported     No    Warm-up and Cool-down  Performed on first and last piece of equipment    Resistance Training Performed  Yes    VAD Patient?  No      Pain Assessment   Currently in Pain?  No/denies          Social History   Tobacco Use  Smoking Status Former Smoker  . Packs/day: 0.25  . Years: 20.00  . Pack years: 5.00  . Types: Cigarettes  . Last attempt to quit: 02/20/2016  . Years since quitting: 1.9  Smokeless Tobacco Never Used    Goals Met:  Independence with exercise equipment Exercise tolerated well No report of cardiac concerns or symptoms Strength training completed today  Goals Unmet:  Not Applicable  Comments: First full day of exercise!  Patient was oriented to gym and equipment including functions, settings, policies, and procedures.  Patient's individual exercise prescription and treatment plan were reviewed.  All starting workloads were established based on the results of the 6 minute walk test done at initial orientation visit.  The plan for exercise progression was also introduced and progression will be customized based on patient's performance and goals.    Dr. Emily Filbert is Medical Director for [employer] and [facility]  Pulmonary Rehabilitation.

## 2018-01-30 NOTE — Patient Instructions (Signed)
It was great to see you today!  Please STOP losartan and START Entresto 24-26 mg TWICE DAILY.   Please get blood work in 1 week at [facility] in [place].   Please keep your appointment with Dr. Haroldine Laws on 02/19/18.

## 2018-02-03 ENCOUNTER — Telehealth: Payer: Self-pay | Admitting: Internal Medicine

## 2018-02-03 NOTE — Telephone Encounter (Signed)
Pt sister called, and is asking if pt still needs to keep holter appt for 2/11. Please advise.

## 2018-02-03 NOTE — Telephone Encounter (Signed)
[name], he already has his ICD implanted. Do you still want him to have a report holter for PVC burden on amiodarone?

## 2018-02-04 ENCOUNTER — Encounter: Payer: Commercial Managed Care - PPO | Attending: Cardiovascular Disease | Admitting: *Deleted

## 2018-02-04 DIAGNOSIS — I5022 Chronic systolic (congestive) heart failure: Secondary | ICD-10-CM | POA: Diagnosis not present

## 2018-02-04 DIAGNOSIS — I38 Endocarditis, valve unspecified: Secondary | ICD-10-CM | POA: Diagnosis present

## 2018-02-04 NOTE — Progress Notes (Signed)
Daily Session Note  Patient Details  Name: Stephen [name] MRN: [id number] Date of Birth: 1966/01/15 Referring Provider:     Cardiac Rehab from 01/23/2018 in [facility] Cardiac and Pulmonary Rehab  Referring Provider  Arida      Encounter Date: 02/04/2018  Check In: Session Check In - 02/04/18 0818      Check-In   Location  [facility]-Cardiac & Pulmonary Rehab    Staff Present  Alberteen Sam, MA, RCEP, CCRP, Exercise Physiologist;Amanda Oletta Darter, BA, ACSM CEP, Exercise Physiologist;Susanne Bice, RN, BSN, CCRP    Supervising physician immediately available to respond to emergencies  See telemetry face sheet for immediately available ER MD    Medication changes reported      No    Fall or balance concerns reported     No    Warm-up and Cool-down  Performed on first and last piece of equipment    Resistance Training Performed  Yes    VAD Patient?  No      Pain Assessment   Currently in Pain?  No/denies          Social History   Tobacco Use  Smoking Status Former Smoker  . Packs/day: 0.25  . Years: 20.00  . Pack years: 5.00  . Types: Cigarettes  . Last attempt to quit: 02/20/2016  . Years since quitting: 1.9  Smokeless Tobacco Never Used    Goals Met:  Independence with exercise equipment Exercise tolerated well No report of cardiac concerns or symptoms Strength training completed today  Goals Unmet:  Not Applicable  Comments: Pt able to follow exercise prescription today without complaint.  Will continue to monitor for progression. Reviewed home exercise with pt today.  Pt plans to walking at home for exercise. He has been doing 1/2 mile and plans to increase to 30 minutes. Reviewed THR, pulse, RPE, sign and symptoms, NTG use, and when to call 911 or MD.  Also discussed weather considerations and indoor options.  Pt voiced understanding.    Dr. Emily Filbert is Medical Director for [place] and [facility].

## 2018-02-06 ENCOUNTER — Telehealth: Payer: Self-pay | Admitting: *Deleted

## 2018-02-06 ENCOUNTER — Other Ambulatory Visit
Admission: RE | Admit: 2018-02-06 | Discharge: 2018-02-06 | Disposition: A | Discharge status: Home / Self Care | Payer: Commercial Managed Care - PPO | Source: Ambulatory Visit | Attending: Student | Admitting: Student

## 2018-02-06 ENCOUNTER — Other Ambulatory Visit (HOSPITAL_COMMUNITY): Payer: Commercial Managed Care - PPO

## 2018-02-06 DIAGNOSIS — I5022 Chronic systolic (congestive) heart failure: Secondary | ICD-10-CM | POA: Insufficient documentation

## 2018-02-06 LAB — BASIC METABOLIC PANEL
Anion gap: 12 (ref 5–15)
BUN: 19 mg/dL (ref 6–20)
CALCIUM: 9.4 mg/dL (ref 8.9–10.3)
CHLORIDE: 104 mmol/L (ref 101–111)
CO2: 23 mmol/L (ref 22–32)
CREATININE: 1.34 mg/dL — AB (ref 0.61–1.24)
GFR calc Af Amer: >60 mL/min (ref >60)
GFR calc non Af Amer: 59 mL/min — ABNORMAL LOW (ref >60)
Glucose, Bld: 124 mg/dL — ABNORMAL HIGH (ref 65–99)
Potassium: 3.6 mmol/L (ref 3.5–5.1)
SODIUM: 139 mmol/L (ref 135–145)

## 2018-02-06 NOTE — Telephone Encounter (Signed)
Can be done via the device   So holter not necessary

## 2018-02-06 NOTE — Telephone Encounter (Signed)
I left a message on [facility] voice mail (OK per [facility]) that the patient will not need to wear the repeat holter on 02/10/18 and that I will cancel this appointment. I advised the patient keep his follow up that is scheduled on 02/18/18 with [name].

## 2018-02-06 NOTE — Telephone Encounter (Signed)
[name] called and left a message that he would not be here today because his transportation could not pick him up today. Returned call, left message.

## 2018-02-11 ENCOUNTER — Encounter: Payer: Commercial Managed Care - PPO | Admitting: *Deleted

## 2018-02-11 DIAGNOSIS — I5022 Chronic systolic (congestive) heart failure: Secondary | ICD-10-CM

## 2018-02-11 NOTE — Progress Notes (Signed)
Daily Session Note  Patient Details  Name: Stephen [name] MRN: [id number] Date of Birth: 1966/06/06 Referring Provider:     Cardiac Rehab from 01/23/2018 in [facility] Cardiac and Pulmonary Rehab  Referring Provider  Arida      Encounter Date: 02/11/2018  Check In: Session Check In - 02/11/18 0845      Check-In   Location  [facility]-Cardiac & Pulmonary Rehab    Staff Present  Alberteen Sam, MA, RCEP, CCRP, Exercise Physiologist;Amanda Oletta Darter, BA, ACSM CEP, Exercise Physiologist;Susanne Bice, RN, BSN, CCRP    Supervising physician immediately available to respond to emergencies  See telemetry face sheet for immediately available ER MD    Medication changes reported      No    Fall or balance concerns reported     No    Warm-up and Cool-down  Performed on first and last piece of equipment    Resistance Training Performed  Yes    VAD Patient?  No      Pain Assessment   Currently in Pain?  No/denies          Social History   Tobacco Use  Smoking Status Former Smoker  . Packs/day: 0.25  . Years: 20.00  . Pack years: 5.00  . Types: Cigarettes  . Last attempt to quit: 02/20/2016  . Years since quitting: 1.9  Smokeless Tobacco Never Used    Goals Met:  Independence with exercise equipment Exercise tolerated well No report of cardiac concerns or symptoms Strength training completed today  Goals Unmet:  Not Applicable  Comments: Pt able to follow exercise prescription today without complaint.  Will continue to monitor for progression.    Dr. Emily Filbert is Medical Director for [place] and [facility].

## 2018-02-12 ENCOUNTER — Telehealth: Payer: Self-pay | Admitting: Cardiovascular Disease

## 2018-02-12 NOTE — Telephone Encounter (Signed)
Received call from [facility], inquiring of prescribed torsemide dosage. Per Dr. Olin Pia December 2018 [facility] notes and current prescription, pt to take 40mg  in the morning and 20mg  in the evening. Per Jan 31 note from [place], [facility], "Continue carvedilol 6.25 mg BID, torsemide 40 mg QAM and 20 mg QPM and spironolactone 25 mg daily" Patient called for refills, indicating to pharmacist that he was taking 4 tablets QAM.     I s/w pt. He reports he was taking torsemide 80mg  (4 tablets) in the morning but decreased to 40mg  in the AM and 20 in the PM after meeting with Ileene Patrick. He had BMET on 2/7 which was reported as "stable".  Reviewed importance of taking lower dose of torsemide and daily weights. He is careful with sodium and fat intake. He has Feb 19 appt w/[name].  Will route to [name] to make aware patient was taking old dose of torsemide. May want repeat BMET.

## 2018-02-12 NOTE — Telephone Encounter (Signed)
[facility] calling to verify medication dosage for torsemide   tx triage

## 2018-02-13 DIAGNOSIS — I5022 Chronic systolic (congestive) heart failure: Secondary | ICD-10-CM | POA: Diagnosis not present

## 2018-02-13 NOTE — Progress Notes (Signed)
Daily Session Note  Patient Details  Name: Stephen [name] MRN: [id number] Date of Birth: 06-17-1966 Referring Provider:     Cardiac Rehab from 01/23/2018 in [facility] Cardiac and Pulmonary Rehab  Referring Provider  Arida      Encounter Date: 02/13/2018  Check In: Session Check In - 02/13/18 0848      Check-In   Location  [facility]-Cardiac & Pulmonary Rehab    Staff Present  Alberteen Sam, MA, RCEP, CCRP, Exercise Physiologist;Gwynneth Fabio Oletta Darter, BA, ACSM CEP, Exercise Physiologist;Carroll Enterkin, RN, BSN    Supervising physician immediately available to respond to emergencies  See telemetry face sheet for immediately available ER MD    Medication changes reported      No    Fall or balance concerns reported     No    Warm-up and Cool-down  Performed on first and last piece of equipment    Resistance Training Performed  Yes    VAD Patient?  No      Pain Assessment   Currently in Pain?  No/denies    Multiple Pain Sites  No          Social History   Tobacco Use  Smoking Status Former Smoker  . Packs/day: 0.25  . Years: 20.00  . Pack years: 5.00  . Types: Cigarettes  . Last attempt to quit: 02/20/2016  . Years since quitting: 1.9  Smokeless Tobacco Never Used    Goals Met:  Independence with exercise equipment Exercise tolerated well No report of cardiac concerns or symptoms Strength training completed today  Goals Unmet:  Not Applicable  Comments: Pt able to follow exercise prescription today without complaint.  Will continue to monitor for progression.    Dr. Emily Filbert is Medical Director for [place] and [facility].

## 2018-02-18 ENCOUNTER — Encounter: Payer: Commercial Managed Care - PPO | Admitting: *Deleted

## 2018-02-18 ENCOUNTER — Encounter: Payer: Self-pay | Admitting: Internal Medicine

## 2018-02-18 ENCOUNTER — Ambulatory Visit (INDEPENDENT_AMBULATORY_CARE_PROVIDER_SITE_OTHER): Payer: Commercial Managed Care - PPO | Admitting: Internal Medicine

## 2018-02-18 ENCOUNTER — Telehealth: Payer: Self-pay | Admitting: Internal Medicine

## 2018-02-18 VITALS — BP 80/62 | HR 60 | Ht 73.5 in | Wt 243.8 lb

## 2018-02-18 DIAGNOSIS — I472 Ventricular tachycardia, unspecified: Secondary | ICD-10-CM

## 2018-02-18 DIAGNOSIS — I5022 Chronic systolic (congestive) heart failure: Secondary | ICD-10-CM

## 2018-02-18 DIAGNOSIS — Z79899 Other long term (current) drug therapy: Secondary | ICD-10-CM

## 2018-02-18 DIAGNOSIS — I428 Other cardiomyopathies: Secondary | ICD-10-CM | POA: Diagnosis not present

## 2018-02-18 DIAGNOSIS — I4729 Other ventricular tachycardia: Secondary | ICD-10-CM

## 2018-02-18 MED ORDER — RANOLAZINE ER 500 MG PO TB12: 500.0000 mg | ORAL_TABLET | Freq: Two times a day (BID) | ORAL | 3 refills | Status: DC

## 2018-02-18 MED ORDER — MEXILETINE HCL 150 MG PO CAPS: 150.0000 mg | ORAL_CAPSULE | Freq: Two times a day (BID) | ORAL | 3 refills | Status: DC

## 2018-02-18 NOTE — Progress Notes (Signed)
Patient Care Team: [name], MD as PCP - General (Family Medicine) Wellington Hampshire, MD as PCP - Cardiology (Cardiology) [name], FNP as Nurse Practitioner (Family Medicine) Yolonda Kida, MD as Consulting Physician (Cardiology)   HPI  Stephen [name] is a 52 y.o. male Seen with a history of PVCs and a nonischemic cardiomyopathy prior consideration for an ICD.  He also has a history of a TIA and remote syncope.  Initial evaluation 12/18 ventricular ectopy was noted with the plan to use amiodarone for suppression of the ectopy and then reassessment of LV function  Unfortunately he then had a cardiac arrest from which he recovered.  He underwent ICD implantation.  Functional status is improved.  He has mild shortness of breath but no chest pain.  Concerned About ED  Records and Results Reviewed  Hospital records  Past Medical History:  Diagnosis Date  . Arrhythmia   . Cardiac arrest ([place]) 12/23/2017   Brief V-fib arrest  . CHF (congestive heart failure) (H[facility])    nonischemic cardiomyopathy, EF 25%  . Hypertension   . NICM (nonischemic cardiomyopathy) ([place])   . TIA (transient ischemic attack) 06/21/16    Past Surgical History:  Procedure Laterality Date  . ICD IMPLANT N/A 12/30/2017   Procedure: ICD IMPLANT;  Surgeon: Deboraha Sprang, MD;  Location: [place] CV [facility];  Service: Cardiovascular;  Laterality: N/A;  . KNEE SURGERY Right   . LEFT HEART CATH AND CORONARY ANGIOGRAPHY N/A 09/09/2017   Procedure: LEFT HEART CATH AND CORONARY ANGIOGRAPHY;  Surgeon: Teodoro Spray, MD;  Location: [place] CV [facility];  Service: Cardiovascular;  Laterality: N/A;  . VASECTOMY      Current Outpatient Medications  Medication Sig Dispense Refill  . acetaminophen (TYLENOL) 500 MG tablet Take 1 capsule by mouth every 4 (four) hours as needed for fever.    Marland Kitchen albuterol (PROVENTIL HFA;VENTOLIN HFA) 108 (90 Base) MCG/ACT inhaler Inhale 2 puffs into the lungs  every 4 (four) hours as needed for wheezing or shortness of breath. 1 Inhaler 0  . amiodarone (PACERONE) 200 MG tablet Take 200 mg by mouth daily.    Marland Kitchen aspirin EC 81 MG EC tablet Take 1 tablet (81 mg total) by mouth daily. 90 tablet 3  . atorvastatin (LIPITOR) 40 MG tablet Take 1 tablet (40 mg total) by mouth daily at 6 PM. 30 tablet 2  . Camphor-Eucalyptus-Menthol (CHEST RUB) 4.8-1.2-2.6 % OINT Apply 1 application topically daily as needed (congestino).     . carvedilol (COREG) 6.25 MG tablet Take 1 tablet (6.25 mg total) by mouth 2 (two) times daily with a meal. 60 tablet 6  . clopidogrel (PLAVIX) 75 MG tablet Take 1 tablet (75 mg total) by mouth daily. 30 tablet 5  . cyclobenzaprine (FLEXERIL) 10 MG tablet Take 1 tablet (10 mg total) by mouth 3 (three) times daily as needed for muscle spasms. 20 tablet 0  . diclofenac sodium (VOLTAREN) 1 % GEL Apply 2 g topically 4 (four) times daily as needed (pain).     Marland Kitchen docusate sodium (COLACE) 100 MG capsule Take 1 capsule (100 mg total) by mouth 2 (two) times daily as needed for mild constipation. 10 capsule 0  . fluticasone (FLONASE) 50 MCG/ACT nasal spray Place 2 sprays into both nostrils daily. (Patient taking differently: Place 2 sprays into both nostrils daily as needed for allergies. ) 16 g 2  . nitroGLYCERIN (NITROSTAT) 0.4 MG SL tablet Place 1 tablet (0.4  mg total) under the tongue every 5 (five) minutes as needed for chest pain. 30 tablet 0  . pantoprazole (PROTONIX) 40 MG tablet Take 1 tablet (40 mg total) by mouth daily. 30 tablet 0  . sacubitril-valsartan (ENTRESTO) 24-26 MG Take 1 tablet by mouth 2 (two) times daily. 60 tablet 5  . spironolactone (ALDACTONE) 25 MG tablet Take 1 tablet (25 mg total) by mouth daily. 30 tablet 5  . Tiotropium Bromide Monohydrate (SPIRIVA RESPIMAT) 2.5 MCG/ACT AERS Inhale 1 Act into the lungs daily.    Marland Kitchen torsemide (DEMADEX) 20 MG tablet Take 2 tablets (40 mg) in the morning and 1 tablet (20 mg) in the afternoon      No current facility-administered medications for this visit.     Allergies  Allergen Reactions  . Lisinopril Cough      Review of Systems negative except from HPI and PMH  Physical Exam BP (!) 80/62 (BP Location: Right Arm, Patient Position: Sitting, Cuff Size: Normal)   Pulse 60   Ht 6' 1.5" (1.867 m)   Wt 243 lb 12 oz (110.6 kg)   BMI 31.72 kg/m  Well developed and nourished in no acute distress HENT normal Neck supple with JVP-flat Carotids brisk and full without bruits Clear Irregular rate and rhythm, no murmurs or gallops Abd-soft with active BS without hepatomegaly No Clubbing cyanosis edema Skin-warm and dry A & Oriented  Grossly normal sensory and motor function   Sinus rhythm at 60 Interval 25/13/49 LVH PVCs with a right bundle superior axis morphology delayed intrinsicoid deflection  Assessment and  Plan   Cardiomyopathy-nonischemic  Congestive heart failure-class IIb-III 8  Sleep disordered breathing and daytime somnolence question sleep apnea  PVCs/nonsustained ventricular tachycardia-very frequent ,As above   He is euvolemic  PVC burden remains enormous.  Amiodarone has had no effect.  We will try adjunctive facility and will ranolazine, to be determined by cough.  We will also review his tracings with Dr. Charlett Nose see what their thoughts are about the catheter ablation.  Intrinsicoid deflection is a little bit of blunting in the context of his nonischemic myopathy as to the success  Check amiodarone surveillance labs today  Hypotension but without symptoms of lightheadedness.  He does not take nitroglycerin as suggested he could discuss with his PCP and use of sialoliths  We spent more than 50% of our >25 min visit in face to face counseling regarding the above

## 2018-02-18 NOTE — Telephone Encounter (Signed)
Patient sister calling to discuss ov from today please call

## 2018-02-18 NOTE — Patient Instructions (Signed)
Medication Instructions: -  You have been given 2 different prescriptions to price - this will be used to treat the PVC's that you are having. Please DO NOT take both medicaitons, but let us know which one you decide to try [phone]  1) Mexiletine 150 mg- take 1 tablet by mouth TWICE daily 2) Ranexa 500 mg- take 1 tablet by mouth TWICE daily   Labwork: - Your physician recommends that you have lab work today: TSH/ Liver   Procedures/Testing: - none ordered  Follow-Up: - Your physician recommends that you schedule a follow-up appointment in: 8 weeks with [name].   Any Additional Special Instructions Will Be Listed Below (If Applicable).     If you need a refill on your cardiac medications before your next appointment, please call your pharmacy.

## 2018-02-18 NOTE — Telephone Encounter (Signed)
I called and spoke with the patient's sister, (ok per the patient) and discussed today's office visit with her.  She is aware of the plan of care going forward at this time. I also advised her per [name], that the patient has ok'ed to return to work with out restrictions.

## 2018-02-18 NOTE — Progress Notes (Signed)
Daily Session Note  Patient Details  Name: Stephen [name] MRN: [id number] Date of Birth: 05-Mar-1966 Referring Provider:     Cardiac Rehab from 01/23/2018 in [facility] Cardiac and Pulmonary Rehab  Referring Provider  Arida       Encounter Date: 02/18/2018  Check In: Session Check In - 02/18/18 0905      Check-In   Location  [facility]-Cardiac & Pulmonary Rehab    Staff Present  Alberteen Sam, MA, RCEP, CCRP, Exercise Physiologist;Amanda Oletta Darter, BA, ACSM CEP, Exercise Physiologist;Susanne Bice, RN, BSN, CCRP;Joseph Flavia Shipper    Supervising physician immediately available to respond to emergencies  See telemetry face sheet for immediately available ER MD    Medication changes reported      No    Fall or balance concerns reported     No    Warm-up and Cool-down  Performed on first and last piece of equipment    Resistance Training Performed  Yes    VAD Patient?  No      Pain Assessment   Currently in Pain?  No/denies          Social History   Tobacco Use  Smoking Status Former Smoker  . Packs/day: 0.25  . Years: 20.00  . Pack years: 5.00  . Types: Cigarettes  . Last attempt to quit: 02/20/2016  . Years since quitting: 1.9  Smokeless Tobacco Never Used    Goals Met:  Independence with exercise equipment Exercise tolerated well Personal goals reviewed No report of cardiac concerns or symptoms Strength training completed today  Goals Unmet:  Not Applicable  Comments: Pt able to follow exercise prescription today without complaint.  Will continue to monitor for progression. See ITP for goal review   Dr. Emily Filbert is Medical Director for [facility].

## 2018-02-19 ENCOUNTER — Other Ambulatory Visit: Payer: Self-pay

## 2018-02-19 ENCOUNTER — Ambulatory Visit (HOSPITAL_COMMUNITY)
Admission: RE | Admit: 2018-02-19 | Discharge: 2018-02-19 | Disposition: A | Discharge status: Home / Self Care | Payer: Commercial Managed Care - PPO | Source: Ambulatory Visit | Attending: Internal Medicine | Admitting: Internal Medicine

## 2018-02-19 ENCOUNTER — Encounter (HOSPITAL_COMMUNITY): Payer: Self-pay | Admitting: Internal Medicine

## 2018-02-19 VITALS — BP 120/80 | HR 48 | Wt 244.0 lb

## 2018-02-19 DIAGNOSIS — I493 Ventricular premature depolarization: Secondary | ICD-10-CM

## 2018-02-19 DIAGNOSIS — Z79899 Other long term (current) drug therapy: Secondary | ICD-10-CM | POA: Diagnosis not present

## 2018-02-19 DIAGNOSIS — I5022 Chronic systolic (congestive) heart failure: Secondary | ICD-10-CM | POA: Diagnosis not present

## 2018-02-19 DIAGNOSIS — Z87891 Personal history of nicotine dependence: Secondary | ICD-10-CM | POA: Insufficient documentation

## 2018-02-19 DIAGNOSIS — I130 Hypertensive heart and chronic kidney disease with heart failure and stage 1 through stage 4 chronic kidney disease, or unspecified chronic kidney disease: Secondary | ICD-10-CM | POA: Diagnosis present

## 2018-02-19 DIAGNOSIS — Z7982 Long term (current) use of aspirin: Secondary | ICD-10-CM | POA: Insufficient documentation

## 2018-02-19 DIAGNOSIS — Z8673 Personal history of transient ischemic attack (TIA), and cerebral infarction without residual deficits: Secondary | ICD-10-CM | POA: Insufficient documentation

## 2018-02-19 DIAGNOSIS — Z8249 Family history of ischemic heart disease and other diseases of the circulatory system: Secondary | ICD-10-CM | POA: Insufficient documentation

## 2018-02-19 LAB — HEPATIC FUNCTION PANEL
ALK PHOS: 100 IU/L (ref 39–117)
ALT: 20 IU/L (ref 0–44)
AST: 16 IU/L (ref 0–40)
Albumin: 4.2 g/dL (ref 3.5–5.5)
BILIRUBIN TOTAL: 0.6 mg/dL (ref 0.0–1.2)
BILIRUBIN, DIRECT: 0.17 mg/dL (ref 0.00–0.40)
Total Protein: 7.5 g/dL (ref 6.0–8.5)

## 2018-02-19 LAB — TSH: TSH: 1.73 u[IU]/mL (ref 0.450–4.500)

## 2018-02-19 MED ORDER — ATORVASTATIN CALCIUM 40 MG PO TABS: 40.0000 mg | ORAL_TABLET | Freq: Every day | ORAL | 6 refills | Status: DC

## 2018-02-19 MED ORDER — NITROGLYCERIN 0.4 MG SL SUBL: 0.4000 mg | SUBLINGUAL_TABLET | SUBLINGUAL | 3 refills | Status: DC | PRN

## 2018-02-19 NOTE — Patient Instructions (Signed)
Your physician recommends that you schedule a follow-up appointment in: 4 weeks  

## 2018-02-19 NOTE — Progress Notes (Signed)
Advanced Heart Failure Clinic Note   Primary Care: [name], MD Primary Cardiologist: Dr Fletcher Anon Primary EP: [name]  HPI: Stephen [name] is a 52 y.o. 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   Admitted 12/24 -> 12/31/17. Pt presented to [facility], but transferred to [facility] for management of worsening HF, Shock, Influenzae A, and VF arrest. Swan placed. Started on NE and milrinone with improved output. Pt had multiple episodes of VT so weaned of pressors as tolerated. S/p ICD 12/30/17 Medtronic. Further work up as below including cMRI.   Today he returns for HF follow up. Overall feeling fine. Says he has had some SOB with exertion on occasion. SOB with steps. Denies PND/Orthopnea. Appetite ok. No fever or chills. Weight at home has been 230-238 pounds. On torsemide 40/20.  Drinking 120 oz of water per day.  He is attending cardiac rehab 3 days a week. Taking all medications. Taking entresto samples for now. Currently not working.   Cardiac MRI 12/28/2017: severe LV dilation, EF 16%, mildly decreased RV systolic function, small area of mid-wall LGE in the mid inferolateral wall.    Review of systems complete and found to be negative unless listed in HPI.    Past Medical History:  Diagnosis Date  . Arrhythmia   . Cardiac arrest ([place]) 12/23/2017   Brief V-fib arrest  . CHF (congestive heart failure) (HCC)    nonischemic cardiomyopathy, EF 25%  . Hypertension   . NICM (nonischemic cardiomyopathy) ([place])   . TIA (transient ischemic attack) 06/21/16   Current Outpatient Medications  Medication Sig Dispense Refill  . acetaminophen (TYLENOL) 500 MG tablet Take 1 capsule by mouth every 4 (four) hours as needed for fever.    Marland Kitchen albuterol (PROVENTIL HFA;VENTOLIN HFA) 108 (90 Base) MCG/ACT inhaler Inhale 2 puffs into the lungs every 4 (four) hours as needed for wheezing or shortness of breath. 1 Inhaler 0  . amiodarone (PACERONE) 200 MG tablet Take 200 mg by mouth daily.    Marland Kitchen aspirin  EC 81 MG EC tablet Take 1 tablet (81 mg total) by mouth daily. 90 tablet 3  . atorvastatin (LIPITOR) 40 MG tablet Take 1 tablet (40 mg total) by mouth daily at 6 PM. 30 tablet 2  . Camphor-Eucalyptus-Menthol (CHEST RUB) 4.8-1.2-2.6 % OINT Apply 1 application topically daily as needed (congestino).     . carvedilol (COREG) 6.25 MG tablet Take 1 tablet (6.25 mg total) by mouth 2 (two) times daily with a meal. 60 tablet 6  . clopidogrel (PLAVIX) 75 MG tablet Take 1 tablet (75 mg total) by mouth daily. 30 tablet 5  . diclofenac sodium (VOLTAREN) 1 % GEL Apply 2 g topically 4 (four) times daily as needed (pain).     Marland Kitchen docusate sodium (COLACE) 100 MG capsule Take 1 capsule (100 mg total) by mouth 2 (two) times daily as needed for mild constipation. 10 capsule 0  . fluticasone (FLONASE) 50 MCG/ACT nasal spray Place 2 sprays into both nostrils daily. (Patient taking differently: Place 2 sprays into both nostrils daily as needed for allergies. ) 16 g 2  . mexiletine (MEXITIL) 150 MG capsule Take 1 capsule (150 mg total) by mouth 2 (two) times daily. 60 capsule 3  . nitroGLYCERIN (NITROSTAT) 0.4 MG SL tablet Place 1 tablet (0.4 mg total) under the tongue every 5 (five) minutes as needed for chest pain. 30 tablet 0  . sacubitril-valsartan (ENTRESTO) 24-26 MG Take 1 tablet by mouth 2 (two) times daily. Emanuel  tablet 5  . spironolactone (ALDACTONE) 25 MG tablet Take 1 tablet (25 mg total) by mouth daily. 30 tablet 5  . Tiotropium Bromide Monohydrate (SPIRIVA RESPIMAT) 2.5 MCG/ACT AERS Inhale 1 Act into the lungs daily.    Marland Kitchen torsemide (DEMADEX) 20 MG tablet Take 2 tablets (40 mg) in the morning and 1 tablet (20 mg) in the afternoon     No current facility-administered medications for this encounter.    Allergies  Allergen Reactions  . Lisinopril Cough   Social History   Socioeconomic History  . Marital status: Single    Spouse name: Not on file  . Number of children: Not on file  . Years of education: Not  on file  . Highest education level: Not on file  Social Needs  . Financial resource strain: Not on file  . Food insecurity - worry: Not on file  . Food insecurity - inability: Not on file  . Transportation needs - medical: Not on file  . Transportation needs - non-medical: Not on file  Occupational History  . Not on file  Tobacco Use  . Smoking status: Former Smoker    Packs/day: 0.25    Years: 20.00    Pack years: 5.00    Types: Cigarettes    Last attempt to quit: 02/20/2016    Years since quitting: 2.0  . Smokeless tobacco: Never Used  Substance and Sexual Activity  . Alcohol use: Yes    Alcohol/week: 0.0 oz    Comment: beer occasional  . Drug use: No  . Sexual activity: Not on file  Other Topics Concern  . Not on file  Social History Narrative   Independent at baseline   Family History  Problem Relation Age of Onset  . Hypertension Mother   . Heart failure Mother   . Hypertension Father   . CAD Father   . Heart attack Father    Office Visit on 02/18/2018  Component Date Value Ref Range Status  . TSH 02/18/2018 1.730  0.450 - 4.500 uIU/mL Final  . Total Protein 02/18/2018 7.5  6.0 - 8.5 g/dL Final  . Albumin 02/18/2018 4.2  3.5 - 5.5 g/dL Final  . Bilirubin Total 02/18/2018 0.6  0.0 - 1.2 mg/dL Final  . Bilirubin, Direct 02/18/2018 0.17  0.00 - 0.40 mg/dL Final  . Alkaline Phosphatase 02/18/2018 100  39 - 117 IU/L Final  . AST 02/18/2018 16  0 - 40 IU/L Final  . ALT 02/18/2018 20  0 - 44 IU/L Final   Vitals:   02/19/18 1153  BP: 120/80  Pulse: (!) 48  SpO2: 98%  Weight: 244 lb (110.7 kg)   Wt Readings from Last 3 Encounters:  02/19/18 244 lb (110.7 kg)  02/18/18 243 lb 12 oz (110.6 kg)  01/30/18 239 lb 9.6 oz (108.7 kg)    PHYSICAL EXAM: General:  Well appearing. Obese male No resp difficulty. Walked in the clinic HEENT: normal Neck: supple. JVP 5-6. Carotids 2+ bilat; no bruits. No lymphadenopathy or thryomegaly appreciated. Cor: PMI nondisplaced.  Irregular rate & rhythm. No rubs, gallops or murmurs. Lungs: clear Abdomen: obese soft, nontender, nondistended. No hepatosplenomegaly. No bruits or masses. Good bowel sounds. Extremities: no cyanosis, clubbing, rash, edema Neuro: alert & orientedx3, cranial nerves grossly intact. moves all 4 extremities w/o difficulty. Affect pleasant  EKG: SR with frequent monomorphic PVCs 84 bpm   ASSESSMENT & PLAN:  1. Chronic systolic HFwith cardiogenic shock: Due to NiCM. EF 20%.  Cath 9/18 with non-obstructive  CAD.  Cardiac MRI with EF 16%, severe LV dilation and small area of mid-wall LGE in the mid inferolateral wall.  Possible prior myocarditis as cause of cardiomyopathy with PVCs contributing.  Consider cardiac sarcoidosis with LGE pattern but review of 9/18 CTA chest did not show signs of pulmonary sarcoidosis or hilar adenopathy.  Now off milrinone.   - Improved NYHA II-III.   - Volume status stable.  Continue torsemide - Continue  Coreg 6.25 mg BID.  - Continue entresto 24-26 mg. He has been trying to get entresto co-pay for medication.  - Continue spironolactone 25 daily. Dig stopped a few a few weeks ago by Dr Caryl Comes    2. VF arrest on 12/24 at [facility]: S/p defibrillation x 1. Multiple runs VT overnight into 12/24/17. Improved with decreasing norepi and starting IV amio. Now has Medtronic ICD.  - No VT/VF on ICD interrogation. -Continue amio for now.  -He was instructed to start mexiletine 150 mg twice a day or ranexa  500 twice day. I discussed with HF Pharmacist. Mexiletine likely cheaper. He will verify at his PCP.  -  Continue coreg as above.  - No driving x 6 months. Pt aware. June 2019    3. CKD III: -Reviewed BMET from 2/7. Stable.    4. Frequent PVCs:  - Continue Amio and EP added ranolazine or mexitil as noted above.  - Continue low dose coreg as above.  -EP follow up in 8 weeks. May need ablation.   Follow up in 4 weeks.   Darrick Grinder, NP 02/19/18   Patient seen and examined  with Darrick Grinder, NP. We discussed all aspects of the encounter. I agree with the assessment and plan as stated above.   He is improved. NYHA II-III. Volume status looks good. Still with frequent monomorphic PVCs. Starting mexilitene as above. He has recently saw [name] who has raised possibility of ablation. I discussed this with Mr Tessler and he is very interested in a possible ablation so he can limit his medications. I will discuss further with [name].   Glori Bickers, MD  11:41 PM

## 2018-02-20 ENCOUNTER — Telehealth: Payer: Self-pay | Admitting: Internal Medicine

## 2018-02-20 DIAGNOSIS — I5022 Chronic systolic (congestive) heart failure: Secondary | ICD-10-CM

## 2018-02-20 NOTE — Telephone Encounter (Signed)
[facility] calling stating patient brought in two prescriptions  One was renexa the other was Mexiletine   They need to know which one to fill or if both   Please call back

## 2018-02-20 NOTE — Progress Notes (Signed)
Daily Session Note  Patient Details  Name: Stephen [name] MRN: [id number] Date of Birth: 1966-11-17 Referring Provider:     Cardiac Rehab from 01/23/2018 in [facility] Cardiac and Pulmonary Rehab  Referring Provider  Arida      Encounter Date: 02/20/2018  Check In: Session Check In - 02/20/18 0846      Check-In   Location  [facility]-Cardiac & Pulmonary Rehab    Staff Present  Alberteen Sam, MA, RCEP, CCRP, Exercise Physiologist;Rozell Theiler Oletta Darter, BA, ACSM CEP, Exercise Physiologist;Carroll Enterkin, RN, BSN    Supervising physician immediately available to respond to emergencies  See telemetry face sheet for immediately available ER MD          Social History   Tobacco Use  Smoking Status Former Smoker  . Packs/day: 0.25  . Years: 20.00  . Pack years: 5.00  . Types: Cigarettes  . Last attempt to quit: 02/20/2016  . Years since quitting: 2.0  Smokeless Tobacco Never Used    Goals Met:  Independence with exercise equipment Exercise tolerated well No report of cardiac concerns or symptoms Strength training completed today  Goals Unmet:  Not Applicable  Comments: Pt able to follow exercise prescription today without complaint.  Will continue to monitor for progression.    Dr. Emily Filbert is Medical Director for [facility].

## 2018-02-20 NOTE — Telephone Encounter (Signed)
Spoke with [facility]. Make them aware both prescriptions are NOT to be filled. Patient was given both to price and see which one may be cheaper then was told to call us and let us know which one he would like to go with. They gave me the price on both medications Renexa- about $25 and Mexiletine- around $5-$6.  Spoke with patients sister per DPR to reply these prices to see which may be the best for the patient to afford. She stated that she would prefer the Mexiletine to ensure he will be able to afford it.   [facility] to let them know the patient would like the Mexiletine and would not like the Renexa.

## 2018-02-20 NOTE — Telephone Encounter (Signed)
[facility] to call back to verify which medication patient is taking.

## 2018-02-24 ENCOUNTER — Encounter (HOSPITAL_COMMUNITY): Payer: Self-pay | Admitting: *Deleted

## 2018-02-24 NOTE — Progress Notes (Signed)
Received medical record request from [facility] on 02/11/2018.  Requested records faxed today to [phone].  Original request will be scanned to patient's electronic medical record.

## 2018-02-26 ENCOUNTER — Encounter: Payer: Self-pay | Admitting: *Deleted

## 2018-02-26 DIAGNOSIS — I5022 Chronic systolic (congestive) heart failure: Secondary | ICD-10-CM

## 2018-02-26 NOTE — Progress Notes (Signed)
Cardiac Individual Treatment Plan  Patient Details  Name: Stephen [name] MRN: [id number] Date of Birth: 14-Aug-1966 Referring Provider:     Cardiac Rehab from 01/23/2018 in [facility] Cardiac and Pulmonary Rehab  Referring Provider  Arida      Initial Encounter Date:    Cardiac Rehab from 01/23/2018 in [facility] Cardiac and Pulmonary Rehab  Date  01/23/18  Referring Provider  Fletcher Anon      Visit Diagnosis: Heart failure, chronic systolic ([place])  Patient's Home Medications on Admission:  Current Outpatient Medications:  .  acetaminophen (TYLENOL) 500 MG tablet, Take 1 capsule by mouth every 4 (four) hours as needed for fever., Disp: , Rfl:  .  albuterol (PROVENTIL HFA;VENTOLIN HFA) 108 (90 Base) MCG/ACT inhaler, Inhale 2 puffs into the lungs every 4 (four) hours as needed for wheezing or shortness of breath., Disp: 1 Inhaler, Rfl: 0 .  amiodarone (PACERONE) 200 MG tablet, Take 200 mg by mouth daily., Disp: , Rfl:  .  aspirin EC 81 MG EC tablet, Take 1 tablet (81 mg total) by mouth daily., Disp: 90 tablet, Rfl: 3 .  atorvastatin (LIPITOR) 40 MG tablet, Take 1 tablet (40 mg total) by mouth daily at 6 PM., Disp: 30 tablet, Rfl: 6 .  Camphor-Eucalyptus-Menthol (CHEST RUB) 4.8-1.2-2.6 % OINT, Apply 1 application topically daily as needed (congestino). , Disp: , Rfl:  .  carvedilol (COREG) 6.25 MG tablet, Take 1 tablet (6.25 mg total) by mouth 2 (two) times daily with a meal., Disp: 60 tablet, Rfl: 6 .  clopidogrel (PLAVIX) 75 MG tablet, Take 1 tablet (75 mg total) by mouth daily., Disp: 30 tablet, Rfl: 5 .  diclofenac sodium (VOLTAREN) 1 % GEL, Apply 2 g topically 4 (four) times daily as needed (pain). , Disp: , Rfl:  .  docusate sodium (COLACE) 100 MG capsule, Take 1 capsule (100 mg total) by mouth 2 (two) times daily as needed for mild constipation., Disp: 10 capsule, Rfl: 0 .  fluticasone (FLONASE) 50 MCG/ACT nasal spray, Place 2 sprays into both nostrils daily. (Patient taking differently: Place 2  sprays into both nostrils daily as needed for allergies. ), Disp: 16 g, Rfl: 2 .  mexiletine (MEXITIL) 150 MG capsule, Take 1 capsule (150 mg total) by mouth 2 (two) times daily., Disp: 60 capsule, Rfl: 3 .  nitroGLYCERIN (NITROSTAT) 0.4 MG SL tablet, Place 1 tablet (0.4 mg total) under the tongue every 5 (five) minutes as needed for chest pain., Disp: 25 tablet, Rfl: 3 .  sacubitril-valsartan (ENTRESTO) 24-26 MG, Take 1 tablet by mouth 2 (two) times daily., Disp: 60 tablet, Rfl: 5 .  spironolactone (ALDACTONE) 25 MG tablet, Take 1 tablet (25 mg total) by mouth daily., Disp: 30 tablet, Rfl: 5 .  Tiotropium Bromide Monohydrate (SPIRIVA RESPIMAT) 2.5 MCG/ACT AERS, Inhale 1 Act into the lungs daily., Disp: , Rfl:  .  torsemide (DEMADEX) 20 MG tablet, Take 2 tablets (40 mg) in the morning and 1 tablet (20 mg) in the afternoon, Disp: , Rfl:   Past Medical History: Past Medical History:  Diagnosis Date  . Arrhythmia   . Cardiac arrest ([place]) 12/23/2017   Brief V-fib arrest  . CHF (congestive heart failure) (HCC)    nonischemic cardiomyopathy, EF 25%  . Hypertension   . NICM (nonischemic cardiomyopathy) ([place])   . TIA (transient ischemic attack) 06/21/16    Tobacco Use: Social History   Tobacco Use  Smoking Status Former Smoker  . Packs/day: 0.25  . Years: 20.00  . Pack  years: 5.00  . Types: Cigarettes  . Last attempt to quit: 02/20/2016  . Years since quitting: 2.0  Smokeless Tobacco Never Used    Labs: Recent Review Scientist, physiological    Labs for ITP Cardiac and Pulmonary Rehab Latest Ref Rng & Units 12/24/2017 12/25/2017 12/26/2017 12/26/2017 12/27/2017   Cholestrol 0 - 200 mg/dL - - - - -   LDLCALC 0 - 99 mg/dL - - - - -   HDL >40 mg/dL - - - - -   Trlycerides <150 mg/dL - - - - -   PHART 7.350 - 7.450 - - - - -   PCO2ART 32.0 - 48.0 mmHg - - - - -   HCO3 20.0 - 28.0 mmol/L - - - - -   TCO2 22 - 32 mmol/L - - - - -   ACIDBASEDEF 0.0 - 2.0 mmol/L - - - - -   O2SAT % 66.9 70.3 72.5  54.9 57.2       Exercise Target Goals:    Exercise Program Goal: Individual exercise prescription set using results from initial 6 min walk test and THRR while considering  patient's activity barriers and safety.   Exercise Prescription Goal: Initial exercise prescription builds to 30-45 minutes a day of aerobic activity, 2-3 days per week.  Home exercise guidelines will be given to patient during program as part of exercise prescription that the participant will acknowledge.  Activity Barriers & Risk Stratification: Activity Barriers & Cardiac Risk Stratification - 01/23/18 1354      Activity Barriers & Cardiac Risk Stratification   Activity Barriers  Joint Problems;Arthritis Both knees    Cardiac Risk Stratification  High       6 Minute Walk: 6 Minute Walk    Row Name 01/23/18 1412         6 Minute Walk   Distance  1050 feet     Walk Time  6 minutes     # of Rest Breaks  0     MPH  1.98     METS  3.07     RPE  11     Perceived Dyspnea   1     VO2 Peak  10.75     Symptoms  No     Resting HR  72 bpm     Resting BP  103/79     Resting Oxygen Saturation   98 %     Exercise Oxygen Saturation  during 6 min walk  97 %     Max Ex. HR  94 bpm     Max Ex. BP  112/68     2 Minute Post BP  96/56        Oxygen Initial Assessment:   Oxygen Re-Evaluation:   Oxygen Discharge (Final Oxygen Re-Evaluation):   Initial Exercise Prescription: Initial Exercise Prescription - 01/23/18 1400      Date of Initial Exercise RX and Referring Provider   Date  01/23/18    Referring Provider  Arida      Treadmill   MPH  2    Grade  1.5    Minutes  15    METs  2.85      Recumbant Bike   Level  2    RPM  60    Watts  37    Minutes  15    METs  3      REL-XR   Level  3    Speed  50  Minutes  15    METs  3      T5 Nustep   Level  2    SPM  80    Minutes  15    METs  3      Prescription Details   Frequency (times per week)  3    Duration  Progress to 30  minutes of continuous aerobic without signs/symptoms of physical distress      Intensity   THRR 40-80% of Max Heartrate  110-148    Ratings of Perceived Exertion  11-13    Perceived Dyspnea  0-4      Progression   Progression  Continue to progress workloads to maintain intensity without signs/symptoms of physical distress.      Resistance Training   Training Prescription  Yes    Weight  4 lb    Reps  10-15       Perform Capillary Blood Glucose checks as needed.  Exercise Prescription Changes: Exercise Prescription Changes    Row Name 01/23/18 1400 02/04/18 0900 02/05/18 1500 02/18/18 1600       Response to Exercise   Blood Pressure (Admit)  103/79  -  130/80  144/64    Blood Pressure (Exercise)  112/68  -  150/80  136/74    Blood Pressure (Exit)  96/56  -  108/56  138/60    Heart Rate (Admit)  72 bpm  -  81 bpm  109 bpm    Heart Rate (Exercise)  94 bpm  -  125 bpm  95 bpm    Heart Rate (Exit)  71 bpm  -  101 bpm  81 bpm    Oxygen Saturation (Admit)  98 %  -  -  -    Oxygen Saturation (Exit)  97 %  -  -  -    Rating of Perceived Exertion (Exercise)  11  -  15  14    Symptoms  -  -  none  none    Comments  -  -  second full day of exercise  -    Duration  -  -  Continue with 30 min of aerobic exercise without signs/symptoms of physical distress.  Continue with 30 min of aerobic exercise without signs/symptoms of physical distress.    Intensity  -  -  THRR unchanged  THRR unchanged      Progression   Progression  -  -  Continue to progress workloads to maintain intensity without signs/symptoms of physical distress.  Continue to progress workloads to maintain intensity without signs/symptoms of physical distress.    Average METs  -  -  2.76  3.35      Resistance Training   Training Prescription  -  -  Yes  Yes    Weight  -  -  4 lbs  4 lbs    Reps  -  -  10-15  10-15      Interval Training   Interval Training  -  -  No  No      Treadmill   MPH  -  -  2  2    Grade  -   -  1.5  1.5    Minutes  -  -  15  15    METs  -  -  2.85  2.85      Recumbant Bike   Level  -  -  -  2    Minutes  -  -  -  15    METs  -  -  -  4.4      T5 Nustep   Level  -  -  2  3    Minutes  -  -  15  15    METs  -  -  2.7  2.8      Home Exercise Plan   Plans to continue exercise at  -  Home (comment) walking  Home (comment) walking  Home (comment) walking    Frequency  -  Add 3 additional days to program exercise sessions.  Add 3 additional days to program exercise sessions.  Add 3 additional days to program exercise sessions.    Initial Home Exercises Provided  -  02/04/18  02/04/18  02/04/18       Exercise Comments: Exercise Comments    Row Name 01/30/18 0919           Exercise Comments   First full day of exercise!  Patient was oriented to gym and equipment including functions, settings, policies, and procedures.  Patient's individual exercise prescription and treatment plan were reviewed.  All starting workloads were established based on the results of the 6 minute walk test done at initial orientation visit.  The plan for exercise progression was also introduced and progression will be customized based on patient's performance and goals.          Exercise Goals and Review: Exercise Goals    Row Name 01/23/18 1412             Exercise Goals   Increase Physical Activity  Yes       Intervention  Provide advice, education, support and counseling about physical activity/exercise needs.;Develop an individualized exercise prescription for aerobic and resistive training based on initial evaluation findings, risk stratification, comorbidities and participant's personal goals.       Expected Outcomes  Short Term: Attend rehab on a regular basis to increase amount of physical activity.;Long Term: Add in home exercise to make exercise part of routine and to increase amount of physical activity.;Long Term: Exercising regularly at least 3-5 days a week.       Increase  Strength and Stamina  Yes       Intervention  Provide advice, education, support and counseling about physical activity/exercise needs.;Develop an individualized exercise prescription for aerobic and resistive training based on initial evaluation findings, risk stratification, comorbidities and participant's personal goals.       Expected Outcomes  Short Term: Increase workloads from initial exercise prescription for resistance, speed, and METs.;Short Term: Perform resistance training exercises routinely during rehab and add in resistance training at home;Long Term: Improve cardiorespiratory fitness, muscular endurance and strength as measured by increased METs and functional capacity (6MWT)       Able to understand and use rate of perceived exertion (RPE) scale  Yes       Intervention  Provide education and explanation on how to use RPE scale       Expected Outcomes  Short Term: Able to use RPE daily in rehab to express subjective intensity level;Long Term:  Able to use RPE to guide intensity level when exercising independently       Able to understand and use Dyspnea scale  Yes       Intervention  Provide education and explanation on how to use Dyspnea scale       Expected Outcomes  Short Term: Able to use Dyspnea scale daily in rehab to express subjective sense  of shortness of breath during exertion;Long Term: Able to use Dyspnea scale to guide intensity level when exercising independently       Knowledge and understanding of Target Heart Rate Range (THRR)  Yes       Intervention  Provide education and explanation of THRR including how the numbers were predicted and where they are located for reference       Expected Outcomes  Short Term: Able to state/look up THRR;Long Term: Able to use THRR to govern intensity when exercising independently;Short Term: Able to use daily as guideline for intensity in rehab       Able to check pulse independently  Yes       Intervention  Provide education and  demonstration on how to check pulse in carotid and radial arteries.;Review the importance of being able to check your own pulse for safety during independent exercise       Expected Outcomes  Short Term: Able to explain why pulse checking is important during independent exercise;Long Term: Able to check pulse independently and accurately       Understanding of Exercise Prescription  Yes       Intervention  Provide education, explanation, and written materials on patient's individual exercise prescription       Expected Outcomes  Short Term: Able to explain program exercise prescription;Long Term: Able to explain home exercise prescription to exercise independently          Exercise Goals Re-Evaluation : Exercise Goals Re-Evaluation    Row Name 01/30/18 0906 02/04/18 0947 02/18/18 1630         Exercise Goal Re-Evaluation   Exercise Goals Review  Understanding of Exercise Prescription;Knowledge and understanding of Target Heart Rate Range (THRR);Able to understand and use rate of perceived exertion (RPE) scale  Understanding of Exercise Prescription;Increase Physical Activity;Increase Strength and Stamina;Knowledge and understanding of Target Heart Rate Range (THRR);Able to understand and use rate of perceived exertion (RPE) scale;Able to check pulse independently  Increase Physical Activity;Understanding of Exercise Prescription;Increase Strength and Stamina     Comments  Reviewed RPE scale, THR and program prescription with pt today.  Pt voiced understanding and was given a copy of goals to take home.   Reviewed home exercise with pt today.  Pt plans to walking at home for exercise. He has been doing 1/2 mile and plans to increase to 30 minutes. Reviewed THR, pulse, RPE, sign and symptoms, NTG use, and when to call 911 or MD.  Also discussed weather considerations and indoor options.  Pt voiced understanding.  Stephen [name] has been doing well in rehab.  He has started to increase his walking time at home up  to 70mn.  He has worked his way up to 4.4 METs on the recumbent bike.  We will continue to monitor his progress.      Expected Outcomes  Short: Use RPE daily to regulate intensity.  Long: Follow program prescription in THR.  Short: Increase time of walk to 348m at home and going at least 3 days a week.  Long: Continue to work on increasing strength and stamina   Short: Increase workload on treadmill more.   Long: Continue to exercise on off days.         Discharge Exercise Prescription (Final Exercise Prescription Changes): Exercise Prescription Changes - 02/18/18 1600      Response to Exercise   Blood Pressure (Admit)  144/64    Blood Pressure (Exercise)  136/74    Blood Pressure (Exit)  138/60  Heart Rate (Admit)  109 bpm    Heart Rate (Exercise)  95 bpm    Heart Rate (Exit)  81 bpm    Rating of Perceived Exertion (Exercise)  14    Symptoms  none    Duration  Continue with 30 min of aerobic exercise without signs/symptoms of physical distress.    Intensity  THRR unchanged      Progression   Progression  Continue to progress workloads to maintain intensity without signs/symptoms of physical distress.    Average METs  3.35      Resistance Training   Training Prescription  Yes    Weight  4 lbs    Reps  10-15      Interval Training   Interval Training  No      Treadmill   MPH  2    Grade  1.5    Minutes  15    METs  2.85      Recumbant Bike   Level  2    Minutes  15    METs  4.4      T5 Nustep   Level  3    Minutes  15    METs  2.8      Home Exercise Plan   Plans to continue exercise at  Home (comment) walking    Frequency  Add 3 additional days to program exercise sessions.    Initial Home Exercises Provided  02/04/18       Nutrition:  Target Goals: Understanding of nutrition guidelines, daily intake of sodium <1566m, cholesterol <202m calories 30% from fat and 7% or less from saturated fats, daily to have 5 or more servings of fruits and  vegetables.  Biometrics: Pre Biometrics - 01/23/18 1411      Pre Biometrics   Height  6' 0.75" (1.848 m)    Weight  236 lb 11.2 oz (107.4 kg)    Waist Circumference  40 inches    Hip Circumference  44.5 inches    Waist to Hip Ratio  0.9 %    BMI (Calculated)  31.44    Single Leg Stand  17.5 seconds        Nutrition Therapy Plan and Nutrition Goals: Nutrition Therapy & Goals - 02/13/18 0919      Nutrition Therapy   Diet  DASH    Drug/Food Interactions  Statins/Certain Fruits    Protein (specify units)  10oz    Fiber  35 grams    Saturated Fats  16 max. grams    Fruits and Vegetables  5 servings/day    Sodium  2000 grams      Personal Nutrition Goals   Nutrition Goal  Choose frozen vegetables rather than canned    Personal Goal #2  Aim for 6-8 bottles of water per day; monitor fluid intake vs. sodium intake daily    Personal Goal #3  When you feel up to it, try to cook at home a couple of times per week rather than buying prepared meals to help reduce dietary sodium intake    Comments  per pt, following a [id number] sodium restriction but estimates current daily sodium intake to be [id number]      Intervention Plan   Intervention  Prescribe, educate and counsel regarding individualized specific dietary modifications aiming towards targeted core components such as weight, hypertension, lipid management, diabetes, heart failure and other comorbidities.    Expected Outcomes  Short Term Goal: Understand basic principles of dietary content, such as  calories, fat, sodium, cholesterol and nutrients.;Short Term Goal: A plan has been developed with personal nutrition goals set during dietitian appointment.;Long Term Goal: Adherence to prescribed nutrition plan.       Nutrition Assessments: Nutrition Assessments - 01/23/18 1141      MEDFICTS Scores   Pre Score  18       Nutrition Goals Re-Evaluation:   Nutrition Goals Discharge (Final Nutrition Goals  Re-Evaluation):   Psychosocial: Target Goals: Acknowledge presence or absence of significant depression and/or stress, maximize coping skills, provide positive support system. Participant is able to verbalize types and ability to use techniques and skills needed for reducing stress and depression.   Initial Review & Psychosocial Screening: Initial Psych Review & Screening - 01/23/18 1348      Initial Review   Current issues with  Current Sleep Concerns;Current Stress Concerns    Source of Stress Concerns  Unable to participate in former interests or hobbies    Comments  Stephen [name] reports his unhealthy sleep habits are stemming from him being out of work temporarily and not on a usual routine. He is very motivated to work on his health and then go back to work.       Family Dynamics   Good Support System?  Yes      Screening Interventions   Interventions  Encouraged to exercise;Program counselor consult;To provide support and resources with identified psychosocial needs    Expected Outcomes  Long Term Goal: Stressors or current issues are controlled or eliminated.;Short Term goal: Utilizing psychosocial counselor, staff and physician to assist with identification of specific Stressors or current issues interfering with healing process. Setting desired goal for each stressor or current issue identified.;Short Term goal: Identification and review with participant of any Quality of Life or Depression concerns found by scoring the questionnaire.;Long Term goal: The participant improves quality of Life and PHQ9 Scores as seen by post scores and/or verbalization of changes       Quality of Life Scores:  Quality of Life - 01/23/18 1141      Quality of Life Scores   Health/Function Pre  23.63 %    Socioeconomic Pre  27.5 %    Psych/Spiritual Pre  30 %    Family Pre  28.13 %    GLOBAL Pre  26.59 %      Scores of 19 and below usually indicate a poorer quality of life in these areas.  A difference of   2-3 points is a clinically meaningful difference.  A difference of 2-3 points in the total score of the Quality of Life Index has been associated with significant improvement in overall quality of life, self-image, physical symptoms, and general health in studies assessing change in quality of life.  PHQ-9: Recent Review Flowsheet Data    Depression screen [facility] 2/9 01/23/2018 10/03/2017 09/30/2017 08/29/2017 07/02/2017   Decreased Interest 1 0 0 0 0   Down, Depressed, Hopeless 0 0 0 0 0   PHQ - 2 Score 1 0 0 0 0   Altered sleeping 2 - - - -   Tired, decreased energy 1 - - - -   Change in appetite 1 - - - -   Feeling bad or failure about yourself  0 - - - -   Trouble concentrating 0 - - - -   Moving slowly or fidgety/restless 0 - - - -   Suicidal thoughts 0 - - - -   PHQ-9 Score 5 - - - -  Difficult doing work/chores Not difficult at all - - - -     Interpretation of Total Score  Total Score Depression Severity:  1-4 = Minimal depression, 5-9 = Mild depression, 10-14 = Moderate depression, 15-19 = Moderately severe depression, 20-27 = Severe depression   Psychosocial Evaluation and Intervention:   Psychosocial Re-Evaluation: Psychosocial Re-Evaluation    [place] Name 02/18/18 0847             Psychosocial Re-Evaluation   Current issues with  None Identified       Interventions  Encouraged to attend Cardiac Rehabilitation for the exercise       Continue Psychosocial Services   Follow up required by staff          Psychosocial Discharge (Final Psychosocial Re-Evaluation): Psychosocial Re-Evaluation - 02/18/18 0847      Psychosocial Re-Evaluation   Current issues with  None Identified    Interventions  Encouraged to attend Cardiac Rehabilitation for the exercise    Continue Psychosocial Services   Follow up required by staff       Vocational Rehabilitation: Provide vocational rehab assistance to qualifying candidates.   Vocational Rehab Evaluation & Intervention: Vocational  Rehab - 01/23/18 1352      Initial Vocational Rehab Evaluation & Intervention   Assessment shows need for Vocational Rehabilitation  No       Education: Education Goals: Education classes will be provided on a variety of topics geared toward better understanding of heart health and risk factor modification. Participant will state understanding/return demonstration of topics presented as noted by education test scores.  Learning Barriers/Preferences: Learning Barriers/Preferences - 01/23/18 1413      Learning Barriers/Preferences   Learning Barriers  None    Learning Preferences  Individual Instruction;Group Instruction;Skilled Demonstration       Education Topics:  AED/CPR: - Group verbal and written instruction with the use of models to demonstrate the basic use of the AED with the basic ABC's of resuscitation.   Cardiac Rehab from 02/20/2018 in [facility] Cardiac and Pulmonary Rehab  Date  02/11/18  Educator  SB  Instruction Review Code  1- Verbalizes Understanding      General Nutrition Guidelines/Fats and Fiber: -Group instruction provided by verbal, written material, models and posters to present the general guidelines for heart healthy nutrition. Gives an explanation and review of dietary fats and fiber.   Controlling Sodium/Reading Food Labels: -Group verbal and written material supporting the discussion of sodium use in heart healthy nutrition. Review and explanation with models, verbal and written materials for utilization of the food label.   Cardiac Rehab from 02/20/2018 in [facility] Cardiac and Pulmonary Rehab  Date  02/04/18  Educator  CR  Instruction Review Code  1- Verbalizes Understanding      Exercise Physiology & General Exercise Guidelines: - Group verbal and written instruction with models to review the exercise physiology of the cardiovascular system and associated critical values. Provides general exercise guidelines with specific guidelines to those with heart or  lung disease.    Cardiac Rehab from 02/20/2018 in [facility] Cardiac and Pulmonary Rehab  Date  02/20/18  Educator  [facility]  Instruction Review Code  1- Verbalizes Understanding      Aerobic Exercise & Resistance Training: - Gives group verbal and written instruction on the various components of exercise. Focuses on aerobic and resistive training programs and the benefits of this training and how to safely progress through these programs..   Flexibility, Balance, Mind/Body Relaxation: Provides group verbal/written instruction on  the benefits of flexibility and balance training, including mind/body exercise modes such as yoga, pilates and tai chi.  Demonstration and skill practice provided.   Stress and Anxiety: - Provides group verbal and written instruction about the health risks of elevated stress and causes of high stress.  Discuss the correlation between heart/lung disease and anxiety and treatment options. Review healthy ways to manage with stress and anxiety.   Depression: - Provides group verbal and written instruction on the correlation between heart/lung disease and depressed mood, treatment options, and the stigmas associated with seeking treatment.   Cardiac Rehab from 02/20/2018 in [facility] Cardiac and Pulmonary Rehab  Date  02/18/18  Educator  [facility]  Instruction Review Code  1- Verbalizes Understanding      Anatomy & Physiology of the Heart: - Group verbal and written instruction and models provide basic cardiac anatomy and physiology, with the coronary electrical and arterial systems. Review of Valvular disease and Heart Failure   Cardiac Procedures: - Group verbal and written instruction to review commonly prescribed medications for heart disease. Reviews the medication, class of the drug, and side effects. Includes the steps to properly store meds and maintain the prescription regimen. (beta blockers and nitrates)   Cardiac Medications I: - Group verbal and written instruction to  review commonly prescribed medications for heart disease. Reviews the medication, class of the drug, and side effects. Includes the steps to properly store meds and maintain the prescription regimen.   Cardiac Medications II: -Group verbal and written instruction to review commonly prescribed medications for heart disease. Reviews the medication, class of the drug, and side effects. (all other drug classes)    Go Sex-Intimacy & Heart Disease, Get SMART - Goal Setting: - Group verbal and written instruction through game format to discuss heart disease and the return to sexual intimacy. Provides group verbal and written material to discuss and apply goal setting through the application of the S.M.A.R.T. Method.   Other Matters of the Heart: - Provides group verbal, written materials and models to describe Stable Angina and Peripheral Artery. Includes description of the disease process and treatment options available to the cardiac patient.   Exercise & Equipment Safety: - Individual verbal instruction and demonstration of equipment use and safety with use of the equipment.   Cardiac Rehab from 02/20/2018 in [facility] Cardiac and Pulmonary Rehab  Date  01/23/18  Educator  [facility]  Instruction Review Code  1- Verbalizes Understanding      Infection Prevention: - Provides verbal and written material to individual with discussion of infection control including proper hand washing and proper equipment cleaning during exercise session.   Cardiac Rehab from 02/20/2018 in [facility] Cardiac and Pulmonary Rehab  Date  01/23/18  Educator  [facility]  Instruction Review Code  1- Verbalizes Understanding      Falls Prevention: - Provides verbal and written material to individual with discussion of falls prevention and safety.   Cardiac Rehab from 02/20/2018 in [facility] Cardiac and Pulmonary Rehab  Date  01/23/18  Educator  [facility]  Instruction Review Code  1- Verbalizes Understanding      Diabetes: - Individual verbal and  written instruction to review signs/symptoms of diabetes, desired ranges of glucose level fasting, after meals and with exercise. Acknowledge that pre and post exercise glucose checks will be done for 3 sessions at entry of program.   Know Your Numbers and Risk Factors: -Group verbal and written instruction about important numbers in your health.  Discussion of what are risk  factors and how they play a role in the disease process.  Review of Cholesterol, Blood Pressure, Diabetes, and BMI and the role they play in your overall health.   Sleep Hygiene: -Provides group verbal and written instruction about how sleep can affect your health.  Define sleep hygiene, discuss sleep cycles and impact of sleep habits. Review good sleep hygiene tips.    Cardiac Rehab from 02/20/2018 in [facility] Cardiac and Pulmonary Rehab  Date  01/30/18  Educator  [facility]  Instruction Review Code  1- Verbalizes Understanding      Other: -Provides group and verbal instruction on various topics (see comments)   Knowledge Questionnaire Score: Knowledge Questionnaire Score - 01/23/18 1141      Knowledge Questionnaire Score   Pre Score  21/28 correct answers reviewed with Stephen [name]       Core Components/Risk Factors/Patient Goals at Admission: Personal Goals and Risk Factors at Admission - 01/23/18 1342      Core Components/Risk Factors/Patient Goals on Admission    Weight Management  Yes;Weight Loss    Intervention  Weight Management: Develop a combined nutrition and exercise program designed to reach desired caloric intake, while maintaining appropriate intake of nutrient and fiber, sodium and fats, and appropriate energy expenditure required for the weight goal.;Weight Management: Provide education and appropriate resources to help participant work on and attain dietary goals.;Weight Management/Obesity: Establish reasonable short term and long term weight goals.    Admit Weight  230 lb (104.3 kg)    Goal Weight: Short Term  225  lb (102.1 kg)    Goal Weight: Long Term  225 lb (102.1 kg)    Expected Outcomes  Short Term: Continue to assess and modify interventions until short term weight is achieved;Long Term: Adherence to nutrition and physical activity/exercise program aimed toward attainment of established weight goal;Weight Maintenance: Understanding of the daily nutrition guidelines, which includes 25-35% calories from fat, 7% or less cal from saturated fats, less than 271m cholesterol, less than 1.5gm of sodium, & 5 or more servings of fruits and vegetables daily;Weight Loss: Understanding of general recommendations for a balanced deficit meal plan, which promotes 1-2 lb weight loss per week and includes a negative energy balance of [phone] kcal/d;Understanding recommendations for meals to include 15-35% energy as protein, 25-35% energy from fat, 35-60% energy from carbohydrates, less than 2052mof dietary cholesterol, 20-35 gm of total fiber daily;Understanding of distribution of calorie intake throughout the day with the consumption of 4-5 meals/snacks    Heart Failure  Yes    Intervention  Provide a combined exercise and nutrition program that is supplemented with education, support and counseling about heart failure. Directed toward relieving symptoms such as shortness of breath, decreased exercise tolerance, and extremity edema.    Expected Outcomes  Improve functional capacity of life;Short term: Attendance in program 2-3 days a week with increased exercise capacity. Reported lower sodium intake. Reported increased fruit and vegetable intake. Reports medication compliance.;Short term: Daily weights obtained and reported for increase. Utilizing diuretic protocols set by physician.;Long term: Adoption of self-care skills and reduction of barriers for early signs and symptoms recognition and intervention leading to self-care maintenance.    Lipids  Yes    Intervention  Provide education and support for participant on  nutrition & aerobic/resistive exercise along with prescribed medications to achieve LDL <7035mHDL >38m77m  Expected Outcomes  Short Term: Participant states understanding of desired cholesterol values and is compliant with medications prescribed. Participant is following exercise prescription and  nutrition guidelines.;Long Term: Cholesterol controlled with medications as prescribed, with individualized exercise RX and with personalized nutrition plan. Value goals: LDL < 59m, HDL > 40 mg.    Stress  Yes Stephen [name] is currently out of work until the end of February. He stated he does not want to go back to work until he is 100% better. His health is his main concern now.     Intervention  Offer individual and/or small group education and counseling on adjustment to heart disease, stress management and health-related lifestyle change. Teach and support self-help strategies.;Refer participants experiencing significant psychosocial distress to appropriate mental health specialists for further evaluation and treatment. When possible, include family members and significant others in education/counseling sessions.    Expected Outcomes  Short Term: Participant demonstrates changes in health-related behavior, relaxation and other stress management skills, ability to obtain effective social support, and compliance with psychotropic medications if prescribed.;Long Term: Emotional wellbeing is indicated by absence of clinically significant psychosocial distress or social isolation.       Core Components/Risk Factors/Patient Goals Review:  Goals and Risk Factor Review    Row Name 02/18/18 0841             Core Components/Risk Factors/Patient Goals Review   Personal Goals Review  Weight Management/Obesity;Hypertension;Lipids       Review  TOctavia Bruckneris taking meds as directed - BP has been good.  He is seeing his cardiologist today.  [facility]  He is feeling better overall.  He  is eating darker lettuce in his salad.  He is drinking water regularly.         Expected Outcomes  Short - TOctavia Brucknerwill continue to exercise on his own and follow up with RD about nutrition  Long - Stephen [name] will maintain exercise and healthy eating - he will meet with RD           Core Components/Risk Factors/Patient Goals at Discharge (Final Review):  Goals and Risk Factor Review - 02/18/18 0841      Core Components/Risk Factors/Patient Goals Review   Personal Goals Review  Weight Management/Obesity;Hypertension;Lipids    Review  TOctavia Bruckneris taking meds as directed - BP has been good.  He is seeing his cardiologist today.  [facility] is [facility] 2 [facility]  He is feeling better overall.  He is eating darker lettuce in his salad.  He is drinking water regularly.      Expected Outcomes  Short - TOctavia Brucknerwill continue to exercise on his own and follow up with RD about nutrition  Long - Stephen [name] will maintain exercise and healthy eating - he will meet with RD        ITP Comments: ITP Comments    Row Name 01/23/18 1332 01/29/18 0623 02/26/18 0636       ITP Comments  Med review completed. Initial ITP created. Diagnosis can be found [facility] 12/23/17  30 Day review. Continue with ITP unless directed changes per Medical Director review.  Starts sessions on 1/31  30 day review completed. Continue with ITP unless diercted changes per Medical Director.        Comments:

## 2018-02-28 ENCOUNTER — Encounter (HOSPITAL_COMMUNITY): Payer: Self-pay | Admitting: *Deleted

## 2018-02-28 NOTE — Progress Notes (Signed)
Requested for additional documents from [facility].  Spoke with Vicki Mallet to clarify what they were needing to complete patient's disability. Additional documents faxed today to [phone].

## 2018-03-04 ENCOUNTER — Encounter: Payer: Commercial Managed Care - PPO | Attending: Cardiovascular Disease | Admitting: *Deleted

## 2018-03-04 ENCOUNTER — Ambulatory Visit: Payer: Commercial Managed Care - PPO | Admitting: Cardiovascular Disease

## 2018-03-04 DIAGNOSIS — I38 Endocarditis, valve unspecified: Secondary | ICD-10-CM | POA: Diagnosis present

## 2018-03-04 DIAGNOSIS — I5022 Chronic systolic (congestive) heart failure: Secondary | ICD-10-CM

## 2018-03-04 NOTE — Progress Notes (Signed)
Daily Session Note  Patient Details  Name: Stephen [name] MRN: [id number] Date of Birth: 14-Dec-1966 Referring Provider:     Cardiac Rehab from 01/23/2018 in [facility] Cardiac and Pulmonary Rehab  Referring Provider  Arida      Encounter Date: 03/04/2018  Check In: Session Check In - 03/04/18 0859      Check-In   Location  [facility]-Cardiac & Pulmonary Rehab    Staff Present  Alberteen Sam, MA, RCEP, CCRP, Exercise Physiologist;Amanda Oletta Darter, BA, ACSM CEP, Exercise Physiologist;Susanne Bice, RN, BSN, CCRP    Supervising physician immediately available to respond to emergencies  See telemetry face sheet for immediately available ER MD    Medication changes reported      No    Fall or balance concerns reported     No    Warm-up and Cool-down  Performed on first and last piece of equipment    Resistance Training Performed  Yes    VAD Patient?  No      Pain Assessment   Currently in Pain?  No/denies          Social History   Tobacco Use  Smoking Status Former Smoker  . Packs/day: 0.25  . Years: 20.00  . Pack years: 5.00  . Types: Cigarettes  . Last attempt to quit: 02/20/2016  . Years since quitting: 2.0  Smokeless Tobacco Never Used    Goals Met:  Independence with exercise equipment Exercise tolerated well Personal goals reviewed No report of cardiac concerns or symptoms Strength training completed today  Goals Unmet:  Not Applicable  Comments: Pt able to follow exercise prescription today without complaint.  Will continue to monitor for progression.    Dr. Emily Filbert is Medical Director for [place] and [facility].

## 2018-03-06 DIAGNOSIS — I5022 Chronic systolic (congestive) heart failure: Secondary | ICD-10-CM | POA: Diagnosis not present

## 2018-03-06 NOTE — Progress Notes (Signed)
Daily Session Note  Patient Details  Name: Stephen [name] MRN: [id number] Date of Birth: 1966-01-17 Referring Provider:     Cardiac Rehab from 01/23/2018 in [facility] Cardiac and Pulmonary Rehab  Referring Provider  Arida      Encounter Date: 03/06/2018  Check In: Session Check In - 03/06/18 0842      Check-In   Location  [facility]-Cardiac & Pulmonary Rehab    Staff Present  Alberteen Sam, MA, RCEP, CCRP, Exercise Physiologist;Adajah Cocking Oletta Darter, BA, ACSM CEP, Exercise Physiologist;Carroll Enterkin, RN, BSN    Supervising physician immediately available to respond to emergencies  See telemetry face sheet for immediately available ER MD    Medication changes reported      No    Fall or balance concerns reported     No    Warm-up and Cool-down  Performed on first and last piece of equipment    Resistance Training Performed  Yes    VAD Patient?  No      Pain Assessment   Currently in Pain?  No/denies    Multiple Pain Sites  No          Social History   Tobacco Use  Smoking Status Former Smoker  . Packs/day: 0.25  . Years: 20.00  . Pack years: 5.00  . Types: Cigarettes  . Last attempt to quit: 02/20/2016  . Years since quitting: 2.0  Smokeless Tobacco Never Used    Goals Met:  Independence with exercise equipment Exercise tolerated well No report of cardiac concerns or symptoms Strength training completed today  Goals Unmet:  Not Applicable  Comments: Pt able to follow exercise prescription today without complaint.  Will continue to monitor for progression.    Dr. Emily Filbert is Medical Director for [place] and [facility].

## 2018-03-11 ENCOUNTER — Telehealth: Payer: Self-pay | Admitting: Cardiovascular Disease

## 2018-03-11 DIAGNOSIS — I5022 Chronic systolic (congestive) heart failure: Secondary | ICD-10-CM

## 2018-03-11 NOTE — Telephone Encounter (Signed)
Nurse with cardiac rehab states pt has gained 12 pounds since last week. Please call. Pt states he is not having any symptoms

## 2018-03-11 NOTE — Telephone Encounter (Signed)
S/w patient. When he first got to rehab today the scale said he weighed 246lb. Patient participated in rehab for about 2 hours. Before leaving he weighed himself again and he weighed 232lb. He denies shortness of breath, chest pain, swelling, or dizziness. Something must have been off with the scale a rehab initially.  He was at home when I talked to him, he weighed and he was 234lb. On 03/10/18 235lb. States normal weight for him is between 230-235. Advised patient to continue to monitor by doing daily weights at home and call us if he has weight gain. He verbalized understanding.

## 2018-03-11 NOTE — Progress Notes (Signed)
Daily Session Note  Patient Details  Name: Stephen [name] MRN: [id number] Date of Birth: Jan 08, 1966 Referring Provider:     Cardiac Rehab from 01/23/2018 in [facility] Cardiac and Pulmonary Rehab  Referring Provider  Arida      Encounter Date: 03/11/2018  Check In: Session Check In - 03/11/18 0825      Check-In   Location  [facility]-Cardiac & Pulmonary Rehab    Staff Present  Alberteen Sam, MA, RCEP, CCRP, Exercise Physiologist;Selah Klang Oletta Darter, BA, ACSM CEP, Exercise Physiologist;Susanne Bice, RN, BSN, CCRP    Supervising physician immediately available to respond to emergencies  See telemetry face sheet for immediately available ER MD    Medication changes reported      No    Fall or balance concerns reported     No    Warm-up and Cool-down  Performed on first and last piece of equipment    Resistance Training Performed  Yes    VAD Patient?  No      Pain Assessment   Currently in Pain?  No/denies    Multiple Pain Sites  No          Social History   Tobacco Use  Smoking Status Former Smoker  . Packs/day: 0.25  . Years: 20.00  . Pack years: 5.00  . Types: Cigarettes  . Last attempt to quit: 02/20/2016  . Years since quitting: 2.0  Smokeless Tobacco Never Used    Goals Met:  Independence with exercise equipment Exercise tolerated well No report of cardiac concerns or symptoms Strength training completed today  Goals Unmet:  Not Applicable  Comments: Pt able to follow exercise prescription today without complaint.  Will continue to monitor for progression.    Dr. Emily Filbert is Medical Director for [place] and [facility].

## 2018-03-11 NOTE — Telephone Encounter (Signed)
Pt sister is calling states pt is at cardiac rehab and the nurse there told pt he had "fluid" and should be seen. Please call to discuss

## 2018-03-13 DIAGNOSIS — I5022 Chronic systolic (congestive) heart failure: Secondary | ICD-10-CM

## 2018-03-13 NOTE — Progress Notes (Signed)
Daily Session Note  Patient Details  Name: Stephen [name] MRN: [id number] Date of Birth: 04/01/1966 Referring Provider:     Cardiac Rehab from 01/23/2018 in [facility] Cardiac and Pulmonary Rehab  Referring Provider  Arida      Encounter Date: 03/13/2018  Check In: Session Check In - 03/13/18 0854      Check-In   Location  [facility]-Cardiac & Pulmonary Rehab    Staff Present  Diane Joya Gaskins RN,BSN;Noma Quijas Oletta Darter, BA, ACSM CEP, Exercise Physiologist;Carroll Enterkin, RN, BSN    Supervising physician immediately available to respond to emergencies  See telemetry face sheet for immediately available ER MD    Medication changes reported      No    Fall or balance concerns reported     No    Warm-up and Cool-down  Performed on first and last piece of equipment    Resistance Training Performed  Yes    VAD Patient?  No      Pain Assessment   Currently in Pain?  No/denies    Multiple Pain Sites  No          Social History   Tobacco Use  Smoking Status Former Smoker  . Packs/day: 0.25  . Years: 20.00  . Pack years: 5.00  . Types: Cigarettes  . Last attempt to quit: 02/20/2016  . Years since quitting: 2.0  Smokeless Tobacco Never Used    Goals Met:  Independence with exercise equipment Exercise tolerated well No report of cardiac concerns or symptoms Strength training completed today  Goals Unmet:  Not Applicable  Comments: Pt able to follow exercise prescription today without complaint.  Will continue to monitor for progression.    Dr. Emily Filbert is Medical Director for [place] and [facility].

## 2018-03-19 ENCOUNTER — Telehealth: Payer: Self-pay | Admitting: Internal Medicine

## 2018-03-19 ENCOUNTER — Encounter: Payer: Self-pay | Admitting: Emergency Medicine

## 2018-03-19 ENCOUNTER — Emergency Department: Payer: Commercial Managed Care - PPO

## 2018-03-19 ENCOUNTER — Emergency Department
Admission: EM | Admit: 2018-03-19 | Discharge: 2018-03-19 | Disposition: A | Discharge status: Home / Self Care | Payer: Commercial Managed Care - PPO | Attending: Emergency Medicine | Admitting: Emergency Medicine

## 2018-03-19 DIAGNOSIS — Z8673 Personal history of transient ischemic attack (TIA), and cerebral infarction without residual deficits: Secondary | ICD-10-CM | POA: Insufficient documentation

## 2018-03-19 DIAGNOSIS — I5022 Chronic systolic (congestive) heart failure: Secondary | ICD-10-CM | POA: Insufficient documentation

## 2018-03-19 DIAGNOSIS — R0602 Shortness of breath: Secondary | ICD-10-CM | POA: Diagnosis not present

## 2018-03-19 DIAGNOSIS — Z7902 Long term (current) use of antithrombotics/antiplatelets: Secondary | ICD-10-CM | POA: Diagnosis not present

## 2018-03-19 DIAGNOSIS — J449 Chronic obstructive pulmonary disease, unspecified: Secondary | ICD-10-CM | POA: Diagnosis not present

## 2018-03-19 DIAGNOSIS — Z7982 Long term (current) use of aspirin: Secondary | ICD-10-CM | POA: Diagnosis not present

## 2018-03-19 DIAGNOSIS — M25531 Pain in right wrist: Secondary | ICD-10-CM | POA: Insufficient documentation

## 2018-03-19 DIAGNOSIS — I110 Hypertensive heart disease with heart failure: Secondary | ICD-10-CM | POA: Diagnosis not present

## 2018-03-19 DIAGNOSIS — I252 Old myocardial infarction: Secondary | ICD-10-CM | POA: Insufficient documentation

## 2018-03-19 DIAGNOSIS — Z87891 Personal history of nicotine dependence: Secondary | ICD-10-CM | POA: Insufficient documentation

## 2018-03-19 DIAGNOSIS — Z79899 Other long term (current) drug therapy: Secondary | ICD-10-CM | POA: Insufficient documentation

## 2018-03-19 LAB — COMPREHENSIVE METABOLIC PANEL
ALBUMIN: 4.1 g/dL (ref 3.5–5.0)
ALT: 13 U/L — ABNORMAL LOW (ref 17–63)
AST: 20 U/L (ref 15–41)
Alkaline Phosphatase: 73 U/L (ref 38–126)
Anion gap: 10 (ref 5–15)
BILIRUBIN TOTAL: 1 mg/dL (ref 0.3–1.2)
BUN: 18 mg/dL (ref 6–20)
CHLORIDE: 104 mmol/L (ref 101–111)
CO2: 24 mmol/L (ref 22–32)
Calcium: 9 mg/dL (ref 8.9–10.3)
Creatinine, Ser: 1.46 mg/dL — ABNORMAL HIGH (ref 0.61–1.24)
GFR calc Af Amer: >60 mL/min (ref >60)
GFR calc non Af Amer: 54 mL/min — ABNORMAL LOW (ref >60)
GLUCOSE: 110 mg/dL — AB (ref 65–99)
POTASSIUM: 3.7 mmol/L (ref 3.5–5.1)
SODIUM: 138 mmol/L (ref 135–145)
TOTAL PROTEIN: 7.9 g/dL (ref 6.5–8.1)

## 2018-03-19 LAB — CBC WITH DIFFERENTIAL/PLATELET
BASOS ABS: 0.2 10*3/uL — AB (ref 0–0.1)
BASOS PCT: 1 %
EOS ABS: 0.4 10*3/uL (ref 0–0.7)
Eosinophils Relative: 4 %
HEMATOCRIT: 46.8 % (ref 40.0–52.0)
Hemoglobin: 15.5 g/dL (ref 13.0–18.0)
Lymphocytes Relative: 21 %
Lymphs Abs: 2.4 10*3/uL (ref 1.0–3.6)
MCH: 29.4 pg (ref 26.0–34.0)
MCHC: 33 g/dL (ref 32.0–36.0)
MCV: 89.2 fL (ref 80.0–100.0)
Monocytes Absolute: 1.2 10*3/uL — ABNORMAL HIGH (ref 0.2–1.0)
Monocytes Relative: 10 %
NEUTROS ABS: 7.5 10*3/uL — AB (ref 1.4–6.5)
Neutrophils Relative %: 64 %
PLATELETS: 226 10*3/uL (ref 150–440)
RBC: 5.25 MIL/uL (ref 4.40–5.90)
RDW: 17.1 % — AB (ref 11.5–14.5)
WBC: 11.7 10*3/uL — ABNORMAL HIGH (ref 3.8–10.6)

## 2018-03-19 LAB — TROPONIN I
Troponin I: 0.03 ng/mL (ref <0.03)
Troponin I: 0.03 ng/mL (ref <0.03)

## 2018-03-19 LAB — MAGNESIUM: MAGNESIUM: 2.1 mg/dL (ref 1.7–2.4)

## 2018-03-19 LAB — BRAIN NATRIURETIC PEPTIDE: B Natriuretic Peptide: 1184 pg/mL — ABNORMAL HIGH (ref 0.0–100.0)

## 2018-03-19 MED ORDER — ONDANSETRON HCL 4 MG/2ML IJ SOLN
4.0000 mg | Freq: Once | INTRAMUSCULAR | Status: AC
  Administered 2018-03-19: 4 mg via INTRAVENOUS
  Filled 2018-03-19: qty 2

## 2018-03-19 MED ORDER — TRAMADOL HCL 50 MG PO TABS
50.0000 mg | ORAL_TABLET | Freq: Once | ORAL | Status: AC
  Administered 2018-03-19: 50 mg via ORAL
  Filled 2018-03-19: qty 1

## 2018-03-19 MED ORDER — ACETAMINOPHEN 325 MG PO TABS: 650.0000 mg | ORAL_TABLET | Freq: Four times a day (QID) | ORAL | 0 refills | Status: DC | PRN

## 2018-03-19 MED ORDER — ACETAMINOPHEN 500 MG PO TABS
1000.0000 mg | ORAL_TABLET | Freq: Once | ORAL | Status: AC
  Administered 2018-03-19: 1000 mg via ORAL
  Filled 2018-03-19: qty 2

## 2018-03-19 MED ORDER — TRAMADOL HCL 50 MG PO TABS: 50.0000 mg | ORAL_TABLET | Freq: Four times a day (QID) | ORAL | 0 refills | Status: DC | PRN

## 2018-03-19 NOTE — ED Provider Notes (Signed)
[facility] Emergency Department Provider Note  ____________________________________________  Time seen: Approximately 10:47 AM  I have reviewed the triage vital signs and the nursing notes.   HISTORY  Chief Complaint Shortness of Breath and Arm Pain    HPI Stephen [name] is a 52 y.o. male who complains of right wrist pain that is started when he woke up this morning. Constant, radiates upward in his arm. Worse with movement. No alleviating factors, moderate severity. Denies any recent injuries.  He also complains of shortness of breath which is chronic for him related to nonischemic cardiomyopathy and systolic heart failure requiring and implanted defibrillator. He denies any shocks dizziness syncope or chest pain.     Past Medical History:  Diagnosis Date  . Arrhythmia   . Cardiac arrest ([place]) 12/23/2017   Brief V-fib arrest  . CHF (congestive heart failure) (HCC)    nonischemic cardiomyopathy, EF 25%  . Hypertension   . NICM (nonischemic cardiomyopathy) ([place])   . TIA (transient ischemic attack) 06/21/16     Patient Active Problem List   Diagnosis Date Noted  . Cardiac arrest ([place])   . NICM (nonischemic cardiomyopathy) ([place])   . Cardiogenic shock ([place]) 12/24/2017  . 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 AKI (acute kidney injury) ([place]) 12/24/2017  . Influenza A 12/24/2017  . Pneumonia 12/20/2017  . Chest pain 09/08/2017  . NSTEMI (non-ST elevated myocardial infarction) ([place]) 09/08/2017  . Bradycardia 08/30/2017  . Ventricular tachycardia ([place]) 07/29/2017  . COPD (chronic obstructive pulmonary disease) ([place]) 07/04/2017  . Chronic systolic CHF (congestive heart failure) ([place]) 05/20/2017  . TIA (transient ischemic attack) 06/22/2016  . HTN (hypertension) 06/22/2016  . Acute on chronic systolic CHF (congestive heart failure) ([place]) 06/04/2016  . Heart failure ([place]) 06/04/2016     Past Surgical History:  Procedure Laterality Date  . ICD IMPLANT  N/A 12/30/2017   Procedure: ICD IMPLANT;  Surgeon: Deboraha Sprang, MD;  Location: [place] [facility];  Service: Cardiovascular;  Laterality: N/A;  . KNEE SURGERY Right   . LEFT HEART CATH AND CORONARY ANGIOGRAPHY N/A 09/09/2017   Procedure: LEFT HEART CATH AND CORONARY ANGIOGRAPHY;  Surgeon: Teodoro Spray, MD;  Location: [place] [facility];  Service: Cardiovascular;  Laterality: N/A;  . VASECTOMY       Prior to Admission medications   Medication Sig Start Date End Date Taking? Authorizing Provider  albuterol (PROVENTIL HFA;VENTOLIN HFA) 108 (90 Base) MCG/ACT inhaler Inhale 2 puffs into the lungs every 4 (four) hours as needed for wheezing or shortness of breath. 03/22/17  Yes [name], MD  amiodarone (PACERONE) 200 MG tablet Take 200 mg by mouth daily.   Yes [provider]  aspirin EC 81 MG EC tablet Take 1 tablet (81 mg total) by mouth daily. 01/01/18  Yes Daune Perch, NP  atorvastatin (LIPITOR) 40 MG tablet Take 1 tablet (40 mg total) by mouth daily at 6 PM. 02/19/18  Yes Bensimhon, Shaune Pascal, MD  Camphor-Eucalyptus-Menthol (CHEST RUB) 4.8-1.2-2.6 % OINT Apply 1 application topically daily as needed (congestino).    Yes [provider]  carvedilol (COREG) 6.25 MG tablet Take 1 tablet (6.25 mg total) by mouth 2 (two) times daily with a meal. 01/09/18  Yes Tillery, Satira Mccallum, PA-C  clopidogrel (PLAVIX) 75 MG tablet Take 1 tablet (75 mg total) by mouth daily. 01/30/18  Yes Bensimhon, Shaune Pascal, MD  diclofenac sodium (VOLTAREN) 1 % GEL Apply 2 g topically 4 (four) times daily as needed (pain).  Yes [provider]  docusate sodium (COLACE) 100 MG capsule Take 1 capsule (100 mg total) by mouth 2 (two) times daily as needed for mild constipation. 09/10/17  Yes [name], [name], MD  furosemide (LASIX) 40 MG tablet Take 40 mg by mouth daily.   Yes [provider]  losartan (COZAAR) 25 MG tablet Take 25 mg by mouth 2 (two) times daily.   Yes [provider]  mexiletine (MEXITIL) 150 MG capsule Take 1 capsule (150 mg total) by mouth 2 (two) times daily. 02/18/18  Yes Deboraha Sprang, MD  nitroGLYCERIN (NITROSTAT) 0.4 MG SL tablet Place 1 tablet (0.4 mg total) under the tongue every 5 (five) minutes as needed for chest pain. 02/19/18  Yes Bensimhon, Shaune Pascal, MD  sacubitril-valsartan (ENTRESTO) 24-26 MG Take 1 tablet by mouth 2 (two) times daily. 01/30/18  Yes Bensimhon, Shaune Pascal, MD  Tiotropium Bromide Monohydrate (SPIRIVA RESPIMAT) 2.5 MCG/ACT AERS Inhale 1 Act into the lungs daily.   Yes [provider]  torsemide (DEMADEX) 20 MG tablet Take 2 tablets (40 mg) in the morning and 1 tablet (20 mg) in the afternoon   Yes [provider]  acetaminophen (TYLENOL) 325 MG tablet Take 2 tablets (650 mg total) by mouth every 6 (six) hours as needed. 03/19/18   Carrie Mew, MD  fluticasone [facility]) 50 MCG/ACT nasal spray Place 2 sprays into both nostrils daily. Patient taking differently: Place 2 sprays into both nostrils daily as needed for allergies.  06/20/17   [name], MD  spironolactone (ALDACTONE) 25 MG tablet Take 1 tablet (25 mg total) by mouth daily. Patient not taking: Reported on 03/19/2018 01/01/18   Daune Perch, NP  traMADol (ULTRAM) 50 MG tablet Take 1 tablet (50 mg total) by mouth every 6 (six) hours as needed. 03/19/18   Carrie Mew, MD     Allergies Lisinopril   Family History  Problem Relation Age of Onset  . Hypertension Mother   . Heart failure Mother   . Hypertension Father   . CAD Father   . Heart attack Father     Social History Social History   Tobacco Use  . Smoking status: Former Smoker    Packs/day: 0.25    Years: 20.00    Pack years: 5.00    Types: Cigarettes    Last attempt to quit: 02/20/2016    Years since quitting: 2.0  . Smokeless tobacco: Never Used  Substance Use Topics  . Alcohol use: Yes    Alcohol/week: 0.0 oz    Comment: beer occasional  . Drug use:  No    Review of Systems  Constitutional:   No fever or chills.   Cardiovascular:   No chest pain or syncope. Respiratory:   positive chronic shortness of breath and cough Gastrointestinal:   Negative for abdominal pain, vomiting and diarrhea.  Musculoskeletal:   positive right wrist pain as above All other systems reviewed and are negative except as documented above in ROS and HPI.  ____________________________________________   PHYSICAL EXAM:  VITAL SIGNS: ED Triage Vitals  Enc Vitals Group     BP 03/19/18 0906 112/66     Pulse Rate 03/19/18 0906 (!) 54     Resp 03/19/18 0906 16     Temp 03/19/18 0906 97.6 F (36.4 C)     Temp Source 03/19/18 0906 Oral     SpO2 03/19/18 0906 98 %     Weight 03/19/18 0906 234 lb (106.1 kg)  Height 03/19/18 0906 6\' 1"  (1.854 m)     Head Circumference --      Peak Flow --      Pain Score 03/19/18 0911 9     Pain Loc --      Pain Edu? --      Excl. in [place]? --     Vital signs reviewed, nursing assessments reviewed.   Constitutional:   Alert and oriented. Well appearing and in no distress. Eyes:   No scleral icterus.  EOMI.  ENT   Head:   Normocephalic and atraumatic.   Nose:   No congestion/rhinnorhea.    Mouth/Throat:   MMM, no pharyngeal erythema. No peritonsillar mass.    Neck:   No meningismus. Full ROM. Hematological/Lymphatic/Immunilogical:   No cervical lymphadenopathy. Cardiovascular:   irregular rhythm, rate of about 70. Symmetric bilateral radial and DP pulses.  No murmurs.  Respiratory:   Normal respiratory effort without tachypnea/retractions. Breath sounds are clear and equal bilaterally. No wheezes/rales/rhonchi. Gastrointestinal:   Soft and nontender. Non distended. There is no CVA tenderness.  No rebound, rigidity, or guarding. Musculoskeletal:   focal tenderness and swelling over the snuffbox of the right wrist. Positive pain with axial loading of the thumb, these reproduce his pain complaints. no  erythema or warmth to the touch. Painful range of motion although it is intact of the wrist. Normal range of motion in all extremities. No joint effusions.  No lower extremity tenderness.  No peripheral edema. Neurologic:   Normal speech and language.  Motor grossly intact. No acute focal neurologic deficits are appreciated.  Skin:    Skin is warm, dry and intact. No rash noted.  No petechiae, purpura, or bullae.  ____________________________________________    LABS (pertinent positives/negatives) (all labs ordered are listed, but only abnormal results are displayed) Labs Reviewed  CBC WITH DIFFERENTIAL/PLATELET - Abnormal; Notable for the following components:      Result Value   WBC 11.7 (*)    RDW 17.1 (*)    Neutro Abs 7.5 (*)    Monocytes Absolute 1.2 (*)    Basophils Absolute 0.2 (*)    All other components within normal limits  COMPREHENSIVE METABOLIC PANEL - Abnormal; Notable for the following components:   Glucose, Bld 110 (*)    Creatinine, Ser 1.46 (*)    ALT 13 (*)    GFR calc non Af Amer 54 (*)    All other components within normal limits  BRAIN NATRIURETIC PEPTIDE - Abnormal; Notable for the following components:   B Natriuretic Peptide 1,184.0 (*)    All other components within normal limits  TROPONIN I - Abnormal; Notable for the following components:   Troponin I 0.03 (*)    All other components within normal limits  MAGNESIUM  TROPONIN I   ____________________________________________   EKG  interpreted by me Sinus rhythm with first-degree AV block. PR interval prolonged at 250 ms. QTC prolonged at 524 ms. Left axis, left bundle branch block. Frequent PVCs. Grossly unchanged compared to previous.  ____________________________________________    RADIOLOGY  Dg Chest 2 View  Result Date: 03/19/2018 CLINICAL DATA:  Shortness of breath since yesterday. EXAM: CHEST - 2 VIEW COMPARISON:  01/09/2018 FINDINGS: Chronically enlarged cardiac silhouette.  Pacemaker/AICD in place. Venous hypertension without frank edema. No effusion. No focal pulmonary finding. No significant bone finding. IMPRESSION: Chronically enlarged cardiac silhouette. Worsened pattern of pulmonary venous hypertension without frank edema. Electronically Signed   By: Nelson Chimes M.D.   On: 03/19/2018  10:35   Dg Wrist Complete Right  Result Date: 03/19/2018 CLINICAL DATA:  No known injury.  Wrist pain with movement. EXAM: RIGHT WRIST - COMPLETE 3+ VIEW COMPARISON:  None. FINDINGS: Evidence of fracture, dislocation or erosive arthritis. I think there are mild degenerative changes of the radiocarpal joint. IMPRESSION: Mild radiocarpal degenerative changes.  No other finding. Electronically Signed   By: Nelson Chimes M.D.   On: 03/19/2018 10:35    ____________________________________________   PROCEDURES Procedures  ____________________________________________  DIFFERENTIAL DIAGNOSIS   electrolyte abnormality, congestive heart failure, right wrist injury or arthritis  CLINICAL IMPRESSION / ASSESSMENT AND PLAN / ED COURSE  Pertinent labs & imaging results that were available during my care of the patient were reviewed by me and considered in my medical decision making (see chart for details).   patient presents with right wrist pain. Other complaints appear to be chronic. With frequent ectopy on EKG and monitor, I will obtain a pacemaker interrogation and check labs including troponin. Check chest x-ray and x-ray of the right wrist. At present time the patient does not have symptoms to suggest ACS PE dissection or pneumothorax. Patient is not septic.  Clinical Course as of Mar 19 1129  Wed Mar 19, 2018  1020 pacemaker interrogation reveals no abnormal events, no shocks delivered.  [PS]  1046 Labs negative. Stable ckd. Bnp baseline. Troponin chronically elevated, better than usual.  [PS]  1052 x-rays essentially unremarkable.  [PS]    Clinical Course User Index [PS]  Carrie Mew, MD     ----------------------------------------- 11:30 AM on 03/19/2018 -----------------------------------------  Patient was set up with a follow-up appointment with hand specialist tomorrow morning at 8:30 AM. I looked him up in the controlled substance reporting system, low risk for substance abuse. Prescribed him a limited amount of tramadol for his pain until he can follow up with hand specialty tomorrow. We'll check a second troponin to ensure there is no substantial increase. It felt is unremarkable he can go home. ____________________________________________   FINAL CLINICAL IMPRESSION(S) / ED DIAGNOSES    Final diagnoses:  Acute pain of right wrist  Shortness of breath     ED Discharge Orders        Ordered    acetaminophen (TYLENOL) 325 MG tablet  Every 6 hours PRN     03/19/18 1128    traMADol (ULTRAM) 50 MG tablet  Every 6 hours PRN     03/19/18 1128      Portions of this note were generated with dragon dictation software. Dictation errors may occur despite best attempts at proofreading.    Carrie Mew, MD 03/19/18 1130

## 2018-03-19 NOTE — Discharge Instructions (Addendum)
Your lab tests and xrays are unremarkable today. Follow up with the hand orthopedic specialist tomorrow morning at 8:30am for further evaluation of your wrist pain.

## 2018-03-19 NOTE — ED Notes (Signed)
Pt verbalizes d/c understanding and follow up. PT in NAD at time of departure, VSS. PT in wheelchair to lobby to wait on ride at this time

## 2018-03-19 NOTE — ED Notes (Signed)
First Nurse Note:  Patient complaining of swelling right hand starting this AM.  States he took NTG because pain radiates up right arm.  Denies CP.  Bradycardic - Pulse 54 via monitor.

## 2018-03-19 NOTE — ED Triage Notes (Signed)
Pt with shortness of breath and right arm pain started this am along with cough.

## 2018-03-19 NOTE — Telephone Encounter (Signed)
The patient called directly into the clinic area. He states he is currently in the ER with complaints of right arm pain and swelling to his right hand. He is upset because he feels that no one is doing anything to help him. He states his arm hurts so bad he feels like he could cry. He was given tylenol and tramadol about 40 minutes ago with no relief.  He states he took a NTG yesterday and this helped some. I inquired if a right upper extremity ultrasound had been done.  He states his right hand had been scanned but that was it. He has had 2 sets of cardiac enzymes drawn- 1st set at 0931 was 0.03. The 2nd set at 1127 is pending. The patient states his arm hurts when he moves it up and down.  I advised the patient that what he is describing does not sound to be cardiac in nature.  I explained to the patient that in reviewing his chart from the ER, it appears that the ER physician is getting him set up to see a hand specialists in the morning.  I have encourage the patient to stay until his 2nd set of cardiac enzymes results. I also advised that he could just ask the question as to why just his hand was scanned and not the whole extremity.  He had an ICD placed on 12/12/17, but this was left sided.  The patient was agreeable with waiting for labs to come back and seeing what the ER physician says.  He is aware that I am unsure of what is causing his symptoms- did not ask if he has a history of gout.

## 2018-03-19 NOTE — ED Notes (Signed)
[employer] rep called at this time with report, will fax over

## 2018-03-20 ENCOUNTER — Ambulatory Visit: Payer: Commercial Managed Care - PPO | Admitting: Internal Medicine

## 2018-03-24 ENCOUNTER — Telehealth: Payer: Self-pay | Admitting: *Deleted

## 2018-03-24 ENCOUNTER — Encounter: Payer: Self-pay | Admitting: *Deleted

## 2018-03-24 DIAGNOSIS — I5022 Chronic systolic (congestive) heart failure: Secondary | ICD-10-CM

## 2018-03-24 NOTE — Telephone Encounter (Signed)
Stephen [name]'s sister called to see if he was still able to come to rehab.  She said that he fractured his wrist in rehab last week, but he has not attended since 3/14.  We talked with her about having him return to rehab, but that we would not use that arm until healed. She was amenable to this.

## 2018-03-26 ENCOUNTER — Ambulatory Visit (HOSPITAL_COMMUNITY)
Admission: RE | Admit: 2018-03-26 | Discharge: 2018-03-26 | Disposition: A | Discharge status: Home / Self Care | Payer: Commercial Managed Care - PPO | Source: Ambulatory Visit | Attending: Internal Medicine | Admitting: Internal Medicine

## 2018-03-26 ENCOUNTER — Encounter (HOSPITAL_COMMUNITY): Payer: Self-pay | Admitting: Internal Medicine

## 2018-03-26 ENCOUNTER — Encounter: Payer: Self-pay | Admitting: *Deleted

## 2018-03-26 DIAGNOSIS — I4430 Unspecified atrioventricular block: Secondary | ICD-10-CM | POA: Insufficient documentation

## 2018-03-26 DIAGNOSIS — M109 Gout, unspecified: Secondary | ICD-10-CM | POA: Insufficient documentation

## 2018-03-26 DIAGNOSIS — N183 Chronic kidney disease, stage 3 unspecified: Secondary | ICD-10-CM

## 2018-03-26 DIAGNOSIS — I509 Heart failure, unspecified: Secondary | ICD-10-CM | POA: Diagnosis present

## 2018-03-26 DIAGNOSIS — I5022 Chronic systolic (congestive) heart failure: Secondary | ICD-10-CM

## 2018-03-26 DIAGNOSIS — M25473 Effusion, unspecified ankle: Secondary | ICD-10-CM | POA: Insufficient documentation

## 2018-03-26 DIAGNOSIS — I493 Ventricular premature depolarization: Secondary | ICD-10-CM | POA: Diagnosis not present

## 2018-03-26 DIAGNOSIS — M25531 Pain in right wrist: Secondary | ICD-10-CM | POA: Insufficient documentation

## 2018-03-26 MED ORDER — TORSEMIDE 20 MG PO TABS: 40.0000 mg | ORAL_TABLET | Freq: Two times a day (BID) | ORAL | 3 refills | Status: DC

## 2018-03-26 MED ORDER — POTASSIUM CHLORIDE CRYS ER 20 MEQ PO TBCR: 20.0000 meq | EXTENDED_RELEASE_TABLET | Freq: Every day | ORAL | 3 refills | Status: DC

## 2018-03-26 MED ORDER — SACUBITRIL-VALSARTAN 49-51 MG PO TABS: 1.0000 | ORAL_TABLET | Freq: Two times a day (BID) | ORAL | 3 refills | Status: DC

## 2018-03-26 NOTE — Patient Instructions (Signed)
Increase Entresto to 49/51 mg Twice daily   Increase Torsemide to 40 mg (2 tabs) Twice daily   Start Potassium (k-dur or klor-con) 20 meq daily  Labs in 1 week  You have been referred to follow up with Dr Caryl Comes  Your physician recommends that you schedule a follow-up appointment in: 2 months

## 2018-03-26 NOTE — Progress Notes (Signed)
Cardiac Individual Treatment Plan  Patient Details  Name: Stephen [name] MRN: [id number] Date of Birth: Feb 02, 1966 Referring Provider:     Cardiac Rehab from 01/23/2018 in [facility] Cardiac [facility]monary Rehab  Referring Provider  Arida      Initial Encounter Date:    Cardiac Rehab from 01/23/2018 in [facility] Cardiac and Pulmonary Rehab  Date  01/23/18  Referring Provider  Fletcher Anon      Visit Diagnosis: Heart failure, chronic systolic ([place])  Patient's Home Medications on Admission:  Current Outpatient Medications:  .  acetaminophen (TYLENOL) 325 MG tablet, Take 2 tablets (650 mg total) by mouth every 6 (six) hours as needed., Disp: 60 tablet, Rfl: 0 .  albuterol (PROVENTIL HFA;VENTOLIN HFA) 108 (90 Base) MCG/ACT inhaler, Inhale 2 puffs into the lungs every 4 (four) hours as needed for wheezing or shortness of breath., Disp: 1 Inhaler, Rfl: 0 .  amiodarone (PACERONE) 200 MG tablet, Take 200 mg by mouth daily., Disp: , Rfl:  .  aspirin EC 81 MG EC tablet, Take 1 tablet (81 mg total) by mouth daily., Disp: 90 tablet, Rfl: 3 .  atorvastatin (LIPITOR) 40 MG tablet, Take 1 tablet (40 mg total) by mouth daily at 6 PM., Disp: 30 tablet, Rfl: 6 .  Camphor-Eucalyptus-Menthol (CHEST RUB) 4.8-1.2-2.6 % OINT, Apply 1 application topically daily as needed (congestino). , Disp: , Rfl:  .  carvedilol (COREG) 6.25 MG tablet, Take 1 tablet (6.25 mg total) by mouth 2 (two) times daily with a meal., Disp: 60 tablet, Rfl: 6 .  clopidogrel (PLAVIX) 75 MG tablet, Take 1 tablet (75 mg total) by mouth daily., Disp: 30 tablet, Rfl: 5 .  diclofenac sodium (VOLTAREN) 1 % GEL, Apply 2 g topically 4 (four) times daily as needed (pain). , Disp: , Rfl:  .  docusate sodium (COLACE) 100 MG capsule, Take 1 capsule (100 mg total) by mouth 2 (two) times daily as needed for mild constipation., Disp: 10 capsule, Rfl: 0 .  fluticasone (FLONASE) 50 MCG/ACT nasal spray, Place 2 sprays into both nostrils daily. (Patient taking  differently: Place 2 sprays into both nostrils daily as needed for allergies. ), Disp: 16 g, Rfl: 2 .  furosemide (LASIX) 40 MG tablet, Take 40 mg by mouth daily., Disp: , Rfl:  .  losartan (COZAAR) 25 MG tablet, Take 25 mg by mouth 2 (two) times daily., Disp: , Rfl:  .  mexiletine (MEXITIL) 150 MG capsule, Take 1 capsule (150 mg total) by mouth 2 (two) times daily., Disp: 60 capsule, Rfl: 3 .  nitroGLYCERIN (NITROSTAT) 0.4 MG SL tablet, Place 1 tablet (0.4 mg total) under the tongue every 5 (five) minutes as needed for chest pain., Disp: 25 tablet, Rfl: 3 .  sacubitril-valsartan (ENTRESTO) 24-26 MG, Take 1 tablet by mouth 2 (two) times daily., Disp: 60 tablet, Rfl: 5 .  spironolactone (ALDACTONE) 25 MG tablet, Take 1 tablet (25 mg total) by mouth daily. (Patient not taking: Reported on 03/19/2018), Disp: 30 tablet, Rfl: 5 .  Tiotropium Bromide Monohydrate (SPIRIVA RESPIMAT) 2.5 MCG/ACT AERS, Inhale 1 Act into the lungs daily., Disp: , Rfl:  .  torsemide (DEMADEX) 20 MG tablet, Take 2 tablets (40 mg) in the morning and 1 tablet (20 mg) in the afternoon, Disp: , Rfl:  .  traMADol (ULTRAM) 50 MG tablet, Take 1 tablet (50 mg total) by mouth every 6 (six) hours as needed., Disp: 8 tablet, Rfl: 0  Past Medical History: Past Medical History:  Diagnosis Date  . Arrhythmia   .  Cardiac arrest ([place]) 12/23/2017   Brief V-fib arrest  . CHF (congestive heart failure) (H[facility])    nonischemic cardiomyopathy, EF 25%  . Hypertension   . NICM (nonischemic cardiomyopathy) ([place])   . TIA (transient ischemic attack) 06/21/16    Tobacco Use: Social History   Tobacco Use  Smoking Status Former Smoker  . Packs/day: 0.25  . Years: 20.00  . Pack years: 5.00  . Types: Cigarettes  . Last attempt to quit: 02/20/2016  . Years since quitting: 2.0  Smokeless Tobacco Never Used    Labs: Recent Review Scientist, physiological    Labs for ITP Cardiac and Pulmonary Rehab Latest Ref Rng & Units 12/24/2017 12/25/2017 12/26/2017  12/26/2017 12/27/2017   Cholestrol 0 - 200 mg/dL - - - - -   LDLCALC 0 - 99 mg/dL - - - - -   HDL >40 mg/dL - - - - -   Trlycerides <150 mg/dL - - - - -   PHART 7.350 - 7.450 - - - - -   PCO2ART 32.0 - 48.0 mmHg - - - - -   HCO3 20.0 - 28.0 mmol/L - - - - -   TCO2 22 - 32 mmol/L - - - - -   ACIDBASEDEF 0.0 - 2.0 mmol/L - - - - -   O2SAT % 66.9 70.3 72.5 54.9 57.2       Exercise Target Goals:    Exercise Program Goal: Individual exercise prescription set using results from initial 6 min walk test and THRR while considering  patient's activity barriers and safety.   Exercise Prescription Goal: Initial exercise prescription builds to 30-45 minutes a day of aerobic activity, 2-3 days per week.  Home exercise guidelines will be given to patient during program as part of exercise prescription that the participant will acknowledge.  Activity Barriers & Risk Stratification: Activity Barriers & Cardiac Risk Stratification - 01/23/18 1354      Activity Barriers & Cardiac Risk Stratification   Activity Barriers  Joint Problems;Arthritis Both knees    Cardiac Risk Stratification  High       6 Minute Walk: 6 Minute Walk    Row Name 01/23/18 1412         6 Minute Walk   Distance  1050 feet     Walk Time  6 minutes     # of Rest Breaks  0     MPH  1.98     METS  3.07     RPE  11     Perceived Dyspnea   1     VO2 Peak  10.75     Symptoms  No     Resting HR  72 bpm     Resting BP  103/79     Resting Oxygen Saturation   98 %     Exercise Oxygen Saturation  during 6 min walk  97 %     Max Ex. HR  94 bpm     Max Ex. BP  112/68     2 Minute Post BP  96/56        Oxygen Initial Assessment:   Oxygen Re-Evaluation:   Oxygen Discharge (Final Oxygen Re-Evaluation):   Initial Exercise Prescription: Initial Exercise Prescription - 01/23/18 1400      Date of Initial Exercise RX and Referring Provider   Date  01/23/18    Referring Provider  Arida      Treadmill   MPH  2     Grade  1.5    Minutes  15    METs  2.85      Recumbant Bike   Level  2    RPM  60    Watts  37    Minutes  15    METs  3      REL-XR   Level  3    Speed  50    Minutes  15    METs  3      T5 Nustep   Level  2    SPM  80    Minutes  15    METs  3      Prescription Details   Frequency (times per week)  3    Duration  Progress to 30 minutes of continuous aerobic without signs/symptoms of physical distress      Intensity   THRR 40-80% of Max Heartrate  110-148    Ratings of Perceived Exertion  11-13    Perceived Dyspnea  0-4      Progression   Progression  Continue to progress workloads to maintain intensity without signs/symptoms of physical distress.      Resistance Training   Training Prescription  Yes    Weight  4 lb    Reps  10-15       Perform Capillary Blood Glucose checks as needed.  Exercise Prescription Changes: Exercise Prescription Changes    Row Name 01/23/18 1400 02/04/18 0900 02/05/18 1500 02/18/18 1600 03/04/18 1600     Response to Exercise   Blood Pressure (Admit)  103/79  -  130/80  144/64  126/60   Blood Pressure (Exercise)  112/68  -  150/80  136/74  132/74   Blood Pressure (Exit)  96/56  -  108/56  138/60  126/60   Heart Rate (Admit)  72 bpm  -  81 bpm  109 bpm  80 bpm   Heart Rate (Exercise)  94 bpm  -  125 bpm  95 bpm  108 bpm   Heart Rate (Exit)  71 bpm  -  101 bpm  81 bpm  91 bpm   Oxygen Saturation (Admit)  98 %  -  -  -  -   Oxygen Saturation (Exit)  97 %  -  -  -  -   Rating of Perceived Exertion (Exercise)  11  -  _0 Symptoms  -  -  none  none  none   Comments  -  -  second full day of exercise  -  -   Duration  -  -  Continue with 30 min of aerobic exercise without signs/symptoms of physical distress.  Continue with 30 min of aerobic exercise without signs/symptoms of physical distress.  Continue with 30 min of aerobic exercise without signs/symptoms of physical distress.   Intensity  -  -  THRR unchanged  THRR  unchanged  THRR unchanged     Progression   Progression  -  -  Continue to progress workloads to maintain intensity without signs/symptoms of physical distress.  Continue to progress workloads to maintain intensity without signs/symptoms of physical distress.  Continue to progress workloads to maintain intensity without signs/symptoms of physical distress.   Average METs  -  -  2.76  3.35  3.45     Resistance Training   Training Prescription  -  -  Yes  Yes  Yes   Weight  -  -  4 lbs  4 lbs  4 lbs   Reps  -  -  10-15  10-15  10-15     Interval Training   Interval Training  -  -  No  No  No     Treadmill   MPH  -  -  2  2  -   Grade  -  -  1.5  1.5  -   Minutes  -  -  15  15  -   METs  -  -  2.85  2.85  -     Recumbant Bike   Level  -  -  -  2  3   Minutes  -  -  -  15  15   METs  -  -  -  4.4  3.9     T5 Nustep   Level  -  -  _0 Minutes  -  -  _1 METs  -  -  2.7  2.8  3     Home Exercise Plan   Plans to continue exercise at  -  Home (comment) walking  Home (comment) walking  Home (comment) walking  Home (comment) walking   Frequency  -  Add 3 additional days to program exercise sessions.  Add 3 additional days to program exercise sessions.  Add 3 additional days to program exercise sessions.  Add 3 additional days to program exercise sessions.   Initial Home Exercises Provided  -  02/04/18  02/04/18  02/04/18  02/04/18   Row Name 03/18/18 1500             Response to Exercise   Blood Pressure (Admit)  98/60       Blood Pressure (Exercise)  120/68       Blood Pressure (Exit)  92/58       Heart Rate (Admit)  75 bpm       Heart Rate (Exercise)  86 bpm       Heart Rate (Exit)  72 bpm       Rating of Perceived Exertion (Exercise)  13       Symptoms  none       Duration  Continue with 30 min of aerobic exercise without signs/symptoms of physical distress.       Intensity  THRR unchanged         Progression   Progression  Continue to progress workloads  to maintain intensity without signs/symptoms of physical distress.       Average METs  3.15         Resistance Training   Training Prescription  Yes       Weight  4 lbs       Reps  10-15         Interval Training   Interval Training  No         Treadmill   MPH  2       Grade  1.5       Minutes  15       METs  2.85         Recumbant Bike   Level  3       Minutes  15       METs  3.7         T5 Nustep   Level  3       Minutes  15  METs  2.8         Home Exercise Plan   Plans to continue exercise at  Home (comment) walking       Frequency  Add 3 additional days to program exercise sessions.       Initial Home Exercises Provided  02/04/18          Exercise Comments: Exercise Comments    Row Name 01/30/18 0919 03/04/18 0839         Exercise Comments   First full day of exercise!  Patient was oriented to gym and equipment including functions, settings, policies, and procedures.  Patient's individual exercise prescription and treatment plan were reviewed.  All starting workloads were established based on the results of the 6 minute walk test done at initial orientation visit.  The plan for exercise progression was also introduced and progression will be customized based on patient's performance and goals.  Stephen [name] didn't have a ride here last week.  He came in at 6:30 am today to be able to attend.  He is walking at home when he cant attend class.         Exercise Goals and Review: Exercise Goals    Row Name 01/23/18 1412             Exercise Goals   Increase Physical Activity  Yes       Intervention  Provide advice, education, support and counseling about physical activity/exercise needs.;Develop an individualized exercise prescription for aerobic and resistive training based on initial evaluation findings, risk stratification, comorbidities and participant's personal goals.       Expected Outcomes  Short Term: Attend rehab on a regular basis to increase amount of  physical activity.;Long Term: Add in home exercise to make exercise part of routine and to increase amount of physical activity.;Long Term: Exercising regularly at least 3-5 days a week.       Increase Strength and Stamina  Yes       Intervention  Provide advice, education, support and counseling about physical activity/exercise needs.;Develop an individualized exercise prescription for aerobic and resistive training based on initial evaluation findings, risk stratification, comorbidities and participant's personal goals.       Expected Outcomes  Short Term: Increase workloads from initial exercise prescription for resistance, speed, and METs.;Short Term: Perform resistance training exercises routinely during rehab and add in resistance training at home;Long Term: Improve cardiorespiratory fitness, muscular endurance and strength as measured by increased METs and functional capacity (6MWT)       Able to understand and use rate of perceived exertion (RPE) scale  Yes       Intervention  Provide education and explanation on how to use RPE scale       Expected Outcomes  Short Term: Able to use RPE daily in rehab to express subjective intensity level;Long Term:  Able to use RPE to guide intensity level when exercising independently       Able to understand and use Dyspnea scale  Yes       Intervention  Provide education and explanation on how to use Dyspnea scale       Expected Outcomes  Short Term: Able to use Dyspnea scale daily in rehab to express subjective sense of shortness of breath during exertion;Long Term: Able to use Dyspnea scale to guide intensity level when exercising independently       Knowledge and understanding of Target Heart Rate Range (THRR)  Yes       Intervention  Provide education and explanation of THRR including how the numbers were predicted and where they are located for reference       Expected Outcomes  Short Term: Able to state/look up THRR;Long Term: Able to use THRR to govern  intensity when exercising independently;Short Term: Able to use daily as guideline for intensity in rehab       Able to check pulse independently  Yes       Intervention  Provide education and demonstration on how to check pulse in carotid and radial arteries.;Review the importance of being able to check your own pulse for safety during independent exercise       Expected Outcomes  Short Term: Able to explain why pulse checking is important during independent exercise;Long Term: Able to check pulse independently and accurately       Understanding of Exercise Prescription  Yes       Intervention  Provide education, explanation, and written materials on patient's individual exercise prescription       Expected Outcomes  Short Term: Able to explain program exercise prescription;Long Term: Able to explain home exercise prescription to exercise independently          Exercise Goals Re-Evaluation : Exercise Goals Re-Evaluation    Row Name 01/30/18 0906 02/04/18 0947 02/18/18 1630 03/04/18 1618 03/18/18 1555     Exercise Goal Re-Evaluation   Exercise Goals Review  Understanding of Exercise Prescription;Knowledge and understanding of Target Heart Rate Range (THRR);Able to understand and use rate of perceived exertion (RPE) scale  Understanding of Exercise Prescription;Increase Physical Activity;Increase Strength and Stamina;Knowledge and understanding of Target Heart Rate Range (THRR);Able to understand and use rate of perceived exertion (RPE) scale;Able to check pulse independently  Increase Physical Activity;Understanding of Exercise Prescription;Increase Strength and Stamina  Increase Physical Activity;Understanding of Exercise Prescription;Increase Strength and Stamina  Increase Physical Activity;Understanding of Exercise Prescription;Increase Strength and Stamina   Comments  Reviewed RPE scale, THR and program prescription with pt today.  Pt voiced understanding and was given a copy of goals to take  home.   Reviewed home exercise with pt today.  Pt plans to walking at home for exercise. He has been doing 1/2 mile and plans to increase to 30 minutes. Reviewed THR, pulse, RPE, sign and symptoms, NTG use, and when to call 911 or MD.  Also discussed weather considerations and indoor options.  Pt voiced understanding.  Stephen [name] has been doing well in rehab.  He has started to increase his walking time at home up to 62mn.  He has worked his way up to 4.4 METs on the recumbent bike.  We will continue to monitor his progress.   TOctavia Brucknercontinues to do well in rehab when he is able to get here.  He missed a little over a week but returned at 3.9 METs on the bike.  We will continue to monitor his progression.   TOctavia Brucknerhas been doing well in rehab.  He is up to 2.8 METs on the NuStep.  We will continue to monitor his progression.   Expected Outcomes  Short: Use RPE daily to regulate intensity.  Long: Follow program prescription in THR.  Short: Increase time of walk to 379m at home and going at least 3 days a week.  Long: Continue to work on increasing strength and stamina   Short: Increase workload on treadmill more.   Long: Continue to exercise on off days.   Short: Increase workload on treadmill and T5 NuStep.  Long: Continue to  exercise on days off.   Short: Increase bike and NuStep.  Long: Continue to exercise when not here.       Discharge Exercise Prescription (Final Exercise Prescription Changes): Exercise Prescription Changes - 03/18/18 1500      Response to Exercise   Blood Pressure (Admit)  98/60    Blood Pressure (Exercise)  120/68    Blood Pressure (Exit)  92/58    Heart Rate (Admit)  75 bpm    Heart Rate (Exercise)  86 bpm    Heart Rate (Exit)  72 bpm    Rating of Perceived Exertion (Exercise)  13    Symptoms  none    Duration  Continue with 30 min of aerobic exercise without signs/symptoms of physical distress.    Intensity  THRR unchanged      Progression   Progression  Continue to progress  workloads to maintain intensity without signs/symptoms of physical distress.    Average METs  3.15      Resistance Training   Training Prescription  Yes    Weight  4 lbs    Reps  10-15      Interval Training   Interval Training  No      Treadmill   MPH  2    Grade  1.5    Minutes  15    METs  2.85      Recumbant Bike   Level  3    Minutes  15    METs  3.7      T5 Nustep   Level  3    Minutes  15    METs  2.8      Home Exercise Plan   Plans to continue exercise at  Home (comment) walking    Frequency  Add 3 additional days to program exercise sessions.    Initial Home Exercises Provided  02/04/18       Nutrition:  Target Goals: Understanding of nutrition guidelines, daily intake of sodium <1549m, cholesterol <2019m calories 30% from fat and 7% or less from saturated fats, daily to have 5 or more servings of fruits and vegetables.  Biometrics: Pre Biometrics - 01/23/18 1411      Pre Biometrics   Height  6' 0.75" (1.848 m)    Weight  236 lb 11.2 oz (107.4 kg)    Waist Circumference  40 inches    Hip Circumference  44.5 inches    Waist to Hip Ratio  0.9 %    BMI (Calculated)  31.44    Single Leg Stand  17.5 seconds        Nutrition Therapy Plan and Nutrition Goals: Nutrition Therapy & Goals - 02/13/18 0919      Nutrition Therapy   Diet  DASH    Drug/Food Interactions  Statins/Certain Fruits    Protein (specify units)  10oz    Fiber  35 grams    Saturated Fats  16 max. grams    Fruits and Vegetables  5 servings/day    Sodium  2000 grams      Personal Nutrition Goals   Nutrition Goal  Choose frozen vegetables rather than canned    Personal Goal #2  Aim for 6-8 bottles of water per day; monitor fluid intake vs. sodium intake daily    Personal Goal #3  When you feel up to it, try to cook at home a couple of times per week rather than buying prepared meals to help reduce dietary sodium intake  Comments  per pt, following a 2071m/day sodium restriction  but estimates current daily sodium intake to be 7-8018mday      Intervention Plan   Intervention  Prescribe, educate and counsel regarding individualized specific dietary modifications aiming towards targeted core components such as weight, hypertension, lipid management, diabetes, heart failure and other comorbidities.    Expected Outcomes  Short Term Goal: Understand basic principles of dietary content, such as calories, fat, sodium, cholesterol and nutrients.;Short Term Goal: A plan has been developed with personal nutrition goals set during dietitian appointment.;Long Term Goal: Adherence to prescribed nutrition plan.       Nutrition Assessments: Nutrition Assessments - 01/23/18 1141      MEDFICTS Scores   Pre Score  18       Nutrition Goals Re-Evaluation: Nutrition Goals Re-Evaluation    Row Name 03/04/18 [id number]           Goals   Comment  Stephen [name] is eating less fried food and more fruits and vegetables.  He does eat during the night sometimes.   We spoke about using a food tracker to keep accurate measures of calories and sodium       Expected Outcome  Short - Stephen [name] will use a food tracker to monitor dietary intake Long - Stephen [name] will meet with RD and add her suggestions          Nutrition Goals Discharge (Final Nutrition Goals Re-Evaluation): Nutrition Goals Re-Evaluation - 03/04/18 0833      Goals   Comment  Stephen [name] is eating less fried food and more fruits and vegetables.  He does eat during the night sometimes.   We spoke about using a food tracker to keep accurate measures of calories and sodium    Expected Outcome  Short - Stephen [name] will use a food tracker to monitor dietary intake Long - Stephen [name] will meet with RD and add her suggestions       Psychosocial: Target Goals: Acknowledge presence or absence of significant depression and/or stress, maximize coping skills, provide positive support system. Participant is able to verbalize types and ability to use techniques and skills needed for  reducing stress and depression.   Initial Review & Psychosocial Screening: Initial Psych Review & Screening - 01/23/18 1348      Initial Review   Current issues with  Current Sleep Concerns;Current Stress Concerns    Source of Stress Concerns  Unable to participate in former interests or hobbies    Comments  Stephen [name] reports his unhealthy sleep habits are stemming from him being out of work temporarily and not on a usual routine. He is very motivated to work on his health and then go back to work.       Family Dynamics   Good Support System?  Yes      Screening Interventions   Interventions  Encouraged to exercise;Program counselor consult;To provide support and resources with identified psychosocial needs    Expected Outcomes  Long Term Goal: Stressors or current issues are controlled or eliminated.;Short Term goal: Utilizing psychosocial counselor, staff and physician to assist with identification of specific Stressors or current issues interfering with healing process. Setting desired goal for each stressor or current issue identified.;Short Term goal: Identification and review with participant of any Quality of Life or Depression concerns found by scoring the questionnaire.;Long Term goal: The participant improves quality of Life and PHQ9 Scores as seen by post scores and/or verbalization of changes       Quality of Life  Scores:  Quality of Life - 01/23/18 1141      Quality of Life Scores   Health/Function Pre  23.63 %    Socioeconomic Pre  27.5 %    Psych/Spiritual Pre  30 %    Family Pre  28.13 %    GLOBAL Pre  26.59 %      Scores of 19 and below usually indicate a poorer quality of life in these areas.  A difference of  2-3 points is a clinically meaningful difference.  A difference of 2-3 points in the total score of the Quality of Life Index has been associated with significant improvement in overall quality of life, self-image, physical symptoms, and general health in studies  assessing change in quality of life.  PHQ-9: Recent Review Flowsheet Data    Depression screen [facility] 2/9 01/23/2018 10/03/2017 09/30/2017 08/29/2017 07/02/2017   Decreased Interest 1 0 0 0 0   Down, Depressed, Hopeless 0 0 0 0 0   PHQ - 2 Score 1 0 0 0 0   Altered sleeping 2 - - - -   Tired, decreased energy 1 - - - -   Change in appetite 1 - - - -   Feeling bad or failure about yourself  0 - - - -   Trouble concentrating 0 - - - -   Moving slowly or fidgety/restless 0 - - - -   Suicidal thoughts 0 - - - -   PHQ-9 Score 5 - - - -   Difficult doing work/chores Not difficult at all - - - -     Interpretation of Total Score  Total Score Depression Severity:  1-4 = Minimal depression, 5-9 = Mild depression, 10-14 = Moderate depression, 15-19 = Moderately severe depression, 20-27 = Severe depression   Psychosocial Evaluation and Intervention:   Psychosocial Re-Evaluation: Psychosocial Re-Evaluation    Row Name 02/18/18 0847 03/04/18 0840           Psychosocial Re-Evaluation   Current issues with  None Identified  None Identified      Comments  -  Stephen [name] didnt have a ride to attend last week.  He rode with a friend today and got here at 6:30 am in order to attend class!      Expected Outcomes  -  Short - Stephen [name] will be able to find a ride Long - Stephen [name] will be able to complete HT      Interventions  Encouraged to attend Cardiac Rehabilitation for the exercise  Encouraged to attend Cardiac Rehabilitation for the exercise      Continue Psychosocial Services   Follow up required by staff  -         Psychosocial Discharge (Final Psychosocial Re-Evaluation): Psychosocial Re-Evaluation - 03/04/18 0840      Psychosocial Re-Evaluation   Current issues with  None Identified    Comments  Stephen [name] didnt have a ride to attend last week.  He rode with a friend today and got here at 6:30 am in order to attend class!    Expected Outcomes  Short - Stephen [name] will be able to find a ride Long - Stephen [name] will be able to  complete HT    Interventions  Encouraged to attend Cardiac Rehabilitation for the exercise       Vocational Rehabilitation: Provide vocational rehab assistance to qualifying candidates.   Vocational Rehab Evaluation & Intervention: Vocational Rehab - 01/23/18 1352      Initial Vocational Rehab Evaluation & Intervention  Assessment shows need for Vocational Rehabilitation  No       Education: Education Goals: Education classes will be provided on a variety of topics geared toward better understanding of heart health and risk factor modification. Participant will state understanding/return demonstration of topics presented as noted by education test scores.  Learning Barriers/Preferences: Learning Barriers/Preferences - 01/23/18 1413      Learning Barriers/Preferences   Learning Barriers  None    Learning Preferences  Individual Instruction;Group Instruction;Skilled Demonstration       Education Topics:  AED/CPR: - Group verbal and written instruction with the use of models to demonstrate the basic use of the AED with the basic ABC's of resuscitation.   Cardiac Rehab from 03/13/2018 in [facility] Cardiac and Pulmonary Rehab  Date  02/11/18  Educator  SB  Instruction Review Code  1- Verbalizes Understanding      General Nutrition Guidelines/Fats and Fiber: -Group instruction provided by verbal, written material, models and posters to present the general guidelines for heart healthy nutrition. Gives an explanation and review of dietary fats and fiber.   Controlling Sodium/Reading Food Labels: -Group verbal and written material supporting the discussion of sodium use in heart healthy nutrition. Review and explanation with models, verbal and written materials for utilization of the food label.   Cardiac Rehab from 03/13/2018 in [facility] Cardiac and Pulmonary Rehab  Date  02/04/18  Educator  CR  Instruction Review Code  1- Verbalizes Understanding      Exercise Physiology & General  Exercise Guidelines: - Group verbal and written instruction with models to review the exercise physiology of the cardiovascular system and associated critical values. Provides general exercise guidelines with specific guidelines to those with heart or lung disease.    Cardiac Rehab from 03/13/2018 in [facility] Cardiac and Pulmonary Rehab  Date  02/20/18  Educator  [facility]  Instruction Review Code  1- Verbalizes Understanding      Aerobic Exercise & Resistance Training: - Gives group verbal and written instruction on the various components of exercise. Focuses on aerobic and resistive training programs and the benefits of this training and how to safely progress through these programs..   Flexibility, Balance, Mind/Body Relaxation: Provides group verbal/written instruction on the benefits of flexibility and balance training, including mind/body exercise modes such as yoga, pilates and tai chi.  Demonstration and skill practice provided.   Stress and Anxiety: - Provides group verbal and written instruction about the health risks of elevated stress and causes of high stress.  Discuss the correlation between heart/lung disease and anxiety and treatment options. Review healthy ways to manage with stress and anxiety.   Cardiac Rehab from 03/13/2018 in [facility] Cardiac and Pulmonary Rehab  Date  03/04/18  Educator  [facility]  Instruction Review Code  1- Verbalizes Understanding      Depression: - Provides group verbal and written instruction on the correlation between heart/lung disease and depressed mood, treatment options, and the stigmas associated with seeking treatment.   Cardiac Rehab from 03/13/2018 in [facility] Cardiac and Pulmonary Rehab  Date  02/18/18  Educator  [facility]  Instruction Review Code  1- Verbalizes Understanding      Anatomy & Physiology of the Heart: - Group verbal and written instruction and models provide basic cardiac anatomy and physiology, with the coronary electrical and arterial systems.  Review of Valvular disease and Heart Failure   Cardiac Procedures: - Group verbal and written instruction to review commonly prescribed medications for heart disease. Reviews the medication, class of the  drug, and side effects. Includes the steps to properly store meds and maintain the prescription regimen. (beta blockers and nitrates)   Cardiac Medications I: - Group verbal and written instruction to review commonly prescribed medications for heart disease. Reviews the medication, class of the drug, and side effects. Includes the steps to properly store meds and maintain the prescription regimen.   Cardiac Rehab from 03/13/2018 in [facility] Cardiac and Pulmonary Rehab  Date  03/11/18  Educator  SB  Instruction Review Code  1- Verbalizes Understanding      Cardiac Medications II: -Group verbal and written instruction to review commonly prescribed medications for heart disease. Reviews the medication, class of the drug, and side effects. (all other drug classes)   Cardiac Rehab from 03/13/2018 in [facility] Cardiac and Pulmonary Rehab  Date  03/06/18  Educator  [facility]  Instruction Review Code  1- Verbalizes Understanding       Go Sex-Intimacy & Heart Disease, Get SMART - Goal Setting: - Group verbal and written instruction through game format to discuss heart disease and the return to sexual intimacy. Provides group verbal and written material to discuss and apply goal setting through the application of the S.M.A.R.T. Method.   Other Matters of the Heart: - Provides group verbal, written materials and models to describe Stable Angina and Peripheral Artery. Includes description of the disease process and treatment options available to the cardiac patient.   Exercise & Equipment Safety: - Individual verbal instruction and demonstration of equipment use and safety with use of the equipment.   Cardiac Rehab from 03/13/2018 in [facility] Cardiac and Pulmonary Rehab  Date  01/23/18  Educator  [facility]  Instruction  Review Code  1- Verbalizes Understanding      Infection Prevention: - Provides verbal and written material to individual with discussion of infection control including proper hand washing and proper equipment cleaning during exercise session.   Cardiac Rehab from 03/13/2018 in [facility] Cardiac and Pulmonary Rehab  Date  01/23/18  Educator  [facility]  Instruction Review Code  1- Verbalizes Understanding      Falls Prevention: - Provides verbal and written material to individual with discussion of falls prevention and safety.   Cardiac Rehab from 03/13/2018 in [facility] Cardiac and Pulmonary Rehab  Date  01/23/18  Educator  [facility]  Instruction Review Code  1- Verbalizes Understanding      Diabetes: - Individual verbal and written instruction to review signs/symptoms of diabetes, desired ranges of glucose level fasting, after meals and with exercise. Acknowledge that pre and post exercise glucose checks will be done for 3 sessions at entry of program.   Know Your Numbers and Risk Factors: -Group verbal and written instruction about important numbers in your health.  Discussion of what are risk factors and how they play a role in the disease process.  Review of Cholesterol, Blood Pressure, Diabetes, and BMI and the role they play in your overall health.   Cardiac Rehab from 03/13/2018 in [facility] Cardiac and Pulmonary Rehab  Date  03/06/18  Educator  [facility]  Instruction Review Code  1- Verbalizes Understanding      Sleep Hygiene: -Provides group verbal and written instruction about how sleep can affect your health.  Define sleep hygiene, discuss sleep cycles and impact of sleep habits. Review good sleep hygiene tips.    Cardiac Rehab from 03/13/2018 in [facility] Cardiac and Pulmonary Rehab  Date  01/30/18  Educator  [facility]  Instruction Review Code  1- Verbalizes Understanding  Other: -Provides group and verbal instruction on various topics (see comments)   Knowledge Questionnaire Score: Knowledge  Questionnaire Score - 01/23/18 1141      Knowledge Questionnaire Score   Pre Score  21/28 correct answers reviewed with Stephen [name]       Core Components/Risk Factors/Patient Goals at Admission: Personal Goals and Risk Factors at Admission - 01/23/18 1342      Core Components/Risk Factors/Patient Goals on Admission    Weight Management  Yes;Weight Loss    Intervention  Weight Management: Develop a combined nutrition and exercise program designed to reach desired caloric intake, while maintaining appropriate intake of nutrient and fiber, sodium and fats, and appropriate energy expenditure required for the weight goal.;Weight Management: Provide education and appropriate resources to help participant work on and attain dietary goals.;Weight Management/Obesity: Establish reasonable short term and long term weight goals.    Admit Weight  230 lb (104.3 kg)    Goal Weight: Short Term  225 lb (102.1 kg)    Goal Weight: Long Term  225 lb (102.1 kg)    Expected Outcomes  Short Term: Continue to assess and modify interventions until short term weight is achieved;Long Term: Adherence to nutrition and physical activity/exercise program aimed toward attainment of established weight goal;Weight Maintenance: Understanding of the daily nutrition guidelines, which includes 25-35% calories from fat, 7% or less cal from saturated fats, less than 259m cholesterol, less than 1.5gm of sodium, & 5 or more servings of fruits and vegetables daily;Weight Loss: Understanding of general recommendations for a balanced deficit meal plan, which promotes 1-2 lb weight loss per week and includes a negative energy balance of [phone] kcal/d;Understanding recommendations for meals to include 15-35% energy as protein, 25-35% energy from fat, 35-60% energy from carbohydrates, less than 2031mof dietary cholesterol, 20-35 gm of total fiber daily;Understanding of distribution of calorie intake throughout the day with the consumption of 4-5  meals/snacks    Heart Failure  Yes    Intervention  Provide a combined exercise and nutrition program that is supplemented with education, support and counseling about heart failure. Directed toward relieving symptoms such as shortness of breath, decreased exercise tolerance, and extremity edema.    Expected Outcomes  Improve functional capacity of life;Short term: Attendance in program 2-3 days a week with increased exercise capacity. Reported lower sodium intake. Reported increased fruit and vegetable intake. Reports medication compliance.;Short term: Daily weights obtained and reported for increase. Utilizing diuretic protocols set by physician.;Long term: Adoption of self-care skills and reduction of barriers for early signs and symptoms recognition and intervention leading to self-care maintenance.    Lipids  Yes    Intervention  Provide education and support for participant on nutrition & aerobic/resistive exercise along with prescribed medications to achieve LDL <7079mHDL >35m3m  Expected Outcomes  Short Term: Participant states understanding of desired cholesterol values and is compliant with medications prescribed. Participant is following exercise prescription and nutrition guidelines.;Long Term: Cholesterol controlled with medications as prescribed, with individualized exercise RX and with personalized nutrition plan. Value goals: LDL < 70mg46mL > 40 mg.    Stress  Yes Stephen [name] is currently out of work until the end of February. He stated he does not want to go back to work until he is 100% better. His health is his main concern now.     Intervention  Offer individual and/or small group education and counseling on adjustment to heart disease, stress management and health-related lifestyle change. Teach and support self-help strategies.;Refer  participants experiencing significant psychosocial distress to appropriate mental health specialists for further evaluation and treatment. When possible,  include family members and significant others in education/counseling sessions.    Expected Outcomes  Short Term: Participant demonstrates changes in health-related behavior, relaxation and other stress management skills, ability to obtain effective social support, and compliance with psychotropic medications if prescribed.;Long Term: Emotional wellbeing is indicated by absence of clinically significant psychosocial distress or social isolation.       Core Components/Risk Factors/Patient Goals Review:  Goals and Risk Factor Review    Row Name 02/18/18 0841 03/04/18 0838           Core Components/Risk Factors/Patient Goals Review   Personal Goals Review  Weight Management/Obesity;Hypertension;Lipids  Hypertension;Heart Failure;Lipids      Review  Stephen [name] is taking meds as directed - BP has been good.  He is seeing his cardiologist today.  He is walking 2 miles at home on days not at [facility].  He is feeling better overall.  He is eating darker lettuce in his salad.  He is drinking water regularly.    Stephen [name] is taking meds as directed.  No new changes      Expected Outcomes  Short - Stephen [name] will continue to exercise on his own and follow up with RD about nutrition  Long - Stephen [name] will maintain exercise and healthy eating - he will meet with RD   Short - Stephen [name] will continue to exercise and take meds as directed  Long - Stephen [name] will control CHF and symptoms         Core Components/Risk Factors/Patient Goals at Discharge (Final Review):  Goals and Risk Factor Review - 03/04/18 0838      Core Components/Risk Factors/Patient Goals Review   Personal Goals Review  Hypertension;Heart Failure;Lipids    Review  Stephen [name] is taking meds as directed.  No new changes    Expected Outcomes  Short - Stephen [name] will continue to exercise and take meds as directed  Long - Stephen [name] will control CHF and symptoms       ITP Comments: ITP Comments    Row Name 01/23/18 1332 01/29/18 0623 02/26/18 0636 03/11/18 1022 03/24/18 1015   ITP Comments   Med review completed. Initial ITP created. Diagnosis can be found CHL 12/23/17  30 Day review. Continue with ITP unless directed changes per Medical Director review.  Starts sessions on 1/31  30 day review completed. Continue with ITP unless diercted changes per Medical Director.  Stephen [name] called Dr Fletcher Anon for Stephen [name] since his weight was up - he had no symptoms  Stephen [name]'s sister called to see if he was still able to come to rehab.  She said that he fractured his wrist in rehab last week, but he has not attended since 3/14.  We talked with her about having him return to rehab, but that we would not use that arm until healed. She was amenable to this.    Stephen [name] Name 03/26/18 0639           ITP Comments  30 Day review. Continue with ITP unless directed changes per Medical Director review.  3/14 last visit          Comments:

## 2018-03-26 NOTE — Progress Notes (Signed)
Advanced Heart Failure Clinic Note   Primary Care: [name], MD Primary Cardiologist: Dr Fletcher Anon Primary EP: [name]  HPI: Stephen [name] is a 52 y.o. 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   Admitted 12/24 -> 12/31/17. Pt presented to [facility], but transferred to [facility] for management of worsening HF, Shock, Influenzae A, and VF arrest. Swan placed. Started on NE and milrinone with improved output. Pt had multiple episodes of VT so weaned of pressors as tolerated. S/p ICD 12/30/17 Medtronic. Further work up as below including cMRI.   Today he returns for HF follow up. Overall feeling ok. Going to [facility]. Says he feeling fine but gets SOB when riding the bike. No CP. No SOB with walking around. Mild edema. No orthopnea or PND. Missed medicines this am. Remains on amio and mexilitene for PVCs. Denies palpitations. Drinking 5 bottles of water per day.   Cardiac MRI 12/28/2017: severe LV dilation, EF 16%, mildly decreased RV systolic function, small area of mid-wall LGE in the mid inferolateral wall.    Review of systems complete and found to be negative unless listed in HPI.    Past Medical History:  Diagnosis Date  . Arrhythmia   . Cardiac arrest ([place]) 12/23/2017   Brief V-fib arrest  . CHF (congestive heart failure) (HCC)    nonischemic cardiomyopathy, EF 25%  . Hypertension   . NICM (nonischemic cardiomyopathy) ([place])   . TIA (transient ischemic attack) 06/21/16   Current Outpatient Medications  Medication Sig Dispense Refill  . acetaminophen (TYLENOL) 325 MG tablet Take 2 tablets (650 mg total) by mouth every 6 (six) hours as needed. 60 tablet 0  . albuterol (PROVENTIL HFA;VENTOLIN HFA) 108 (90 Base) MCG/ACT inhaler Inhale 2 puffs into the lungs every 4 (four) hours as needed for wheezing or shortness of breath. 1 Inhaler 0  . amiodarone (PACERONE) 200 MG tablet Take 200 mg by mouth daily.    Marland Kitchen aspirin EC 81 MG EC tablet Take 1 tablet (81 mg total) by mouth daily. 90  tablet 3  . atorvastatin (LIPITOR) 40 MG tablet Take 1 tablet (40 mg total) by mouth daily at 6 PM. 30 tablet 6  . Camphor-Eucalyptus-Menthol (CHEST RUB) 4.8-1.2-2.6 % OINT Apply 1 application topically daily as needed (congestino).     . carvedilol (COREG) 6.25 MG tablet Take 1 tablet (6.25 mg total) by mouth 2 (two) times daily with a meal. 60 tablet 6  . clopidogrel (PLAVIX) 75 MG tablet Take 1 tablet (75 mg total) by mouth daily. 30 tablet 5  . diclofenac sodium (VOLTAREN) 1 % GEL Apply 2 g topically 4 (four) times daily as needed (pain).     Marland Kitchen docusate sodium (COLACE) 100 MG capsule Take 1 capsule (100 mg total) by mouth 2 (two) times daily as needed for mild constipation. 10 capsule 0  . fluticasone (FLONASE) 50 MCG/ACT nasal spray Place 2 sprays into both nostrils daily. (Patient taking differently: Place 2 sprays into both nostrils daily as needed for allergies. ) 16 g 2  . losartan (COZAAR) 25 MG tablet Take 25 mg by mouth 2 (two) times daily.    Marland Kitchen mexiletine (MEXITIL) 150 MG capsule Take 1 capsule (150 mg total) by mouth 2 (two) times daily. 60 capsule 3  . nitroGLYCERIN (NITROSTAT) 0.4 MG SL tablet Place 1 tablet (0.4 mg total) under the tongue every 5 (five) minutes as needed for chest pain. 25 tablet 3  . sacubitril-valsartan (ENTRESTO) 24-26 MG Take 1 tablet  by mouth 2 (two) times daily. 60 tablet 5  . spironolactone (ALDACTONE) 25 MG tablet Take 1 tablet (25 mg total) by mouth daily. 30 tablet 5  . Tiotropium Bromide Monohydrate (SPIRIVA RESPIMAT) 2.5 MCG/ACT AERS Inhale 1 Act into the lungs daily.    Marland Kitchen torsemide (DEMADEX) 20 MG tablet Take 2 tablets (40 mg) in the morning and 1 tablet (20 mg) in the afternoon    . traMADol (ULTRAM) 50 MG tablet Take 1 tablet (50 mg total) by mouth every 6 (six) hours as needed. 8 tablet 0   No current facility-administered medications for this encounter.    Allergies  Allergen Reactions  . Lisinopril Cough   Social History   Socioeconomic  History  . Marital status: Single    Spouse name: Not on file  . Number of children: Not on file  . Years of education: Not on file  . Highest education level: Not on file  Occupational History  . Not on file  Social Needs  . Financial resource strain: Not on file  . Food insecurity:    Worry: Not on file    Inability: Not on file  . Transportation needs:    Medical: Not on file    Non-medical: Not on file  Tobacco Use  . Smoking status: Former Smoker    Packs/day: 0.25    Years: 20.00    Pack years: 5.00    Types: Cigarettes    Last attempt to quit: 02/20/2016    Years since quitting: 2.0  . Smokeless tobacco: Never Used  Substance and Sexual Activity  . Alcohol use: Yes    Alcohol/week: 0.0 oz    Comment: beer occasional  . Drug use: No  . Sexual activity: Not on file  Lifestyle  . Physical activity:    Days per week: Not on file    Minutes per session: Not on file  . Stress: Not on file  Relationships  . Social connections:    Talks on phone: Not on file    Gets together: Not on file    Attends religious service: Not on file    Active member of club or organization: Not on file    Attends meetings of clubs or organizations: Not on file    Relationship status: Not on file  . Intimate partner violence:    Fear of current or ex partner: Not on file    Emotionally abused: Not on file    Physically abused: Not on file    Forced sexual activity: Not on file  Other Topics Concern  . Not on file  Social History Narrative   Independent at baseline   Family History  Problem Relation Age of Onset  . Hypertension Mother   . Heart failure Mother   . Hypertension Father   . CAD Father   . Heart attack Father    Admission on 03/19/2018, Discharged on 03/19/2018  Component Date Value Ref Range Status  . WBC 03/19/2018 11.7* 3.8 - 10.6 K/uL Final  . RBC 03/19/2018 5.25  4.40 - 5.90 MIL/uL Final  . Hemoglobin 03/19/2018 15.5  13.0 - 18.0 g/dL Final  . HCT 03/19/2018  46.8  40.0 - 52.0 % Final  . MCV 03/19/2018 89.2  80.0 - 100.0 fL Final  . MCH 03/19/2018 29.4  26.0 - 34.0 pg Final  . MCHC 03/19/2018 33.0  32.0 - 36.0 g/dL Final  . RDW 03/19/2018 17.1* 11.5 - 14.5 % Final  . Platelets 03/19/2018 226  150 - 440 K/uL Final  . Neutrophils Relative % 03/19/2018 64  % Final  . Neutro Abs 03/19/2018 7.5* 1.4 - 6.5 K/uL Final  . Lymphocytes Relative 03/19/2018 21  % Final  . Lymphs Abs 03/19/2018 2.4  1.0 - 3.6 K/uL Final  . Monocytes Relative 03/19/2018 10  % Final  . Monocytes Absolute 03/19/2018 1.2* 0.2 - 1.0 K/uL Final  . Eosinophils Relative 03/19/2018 4  % Final  . Eosinophils Absolute 03/19/2018 0.4  0 - 0.7 K/uL Final  . Basophils Relative 03/19/2018 1  % Final  . Basophils Absolute 03/19/2018 0.2* 0 - 0.1 K/uL Final   Performed at [facility], [address] [place], [place] [address]  . Sodium 03/19/2018 138  135 - 145 mmol/L Final  . Potassium 03/19/2018 3.7  3.5 - 5.1 mmol/L Final  . Chloride 03/19/2018 104  101 - 111 mmol/L Final  . CO2 03/19/2018 24  22 - 32 mmol/L Final  . Glucose, Bld 03/19/2018 110* 65 - 99 mg/dL Final  . BUN 03/19/2018 18  6 - 20 mg/dL Final  . Creatinine, Ser 03/19/2018 1.46* 0.61 - 1.24 mg/dL Final  . Calcium 03/19/2018 9.0  8.9 - 10.3 mg/dL Final  . Total Protein 03/19/2018 7.9  6.5 - 8.1 g/dL Final  . Albumin 03/19/2018 4.1  3.5 - 5.0 g/dL Final  . AST 03/19/2018 20  15 - 41 U/L Final  . ALT 03/19/2018 13* 17 - 63 U/L Final  . Alkaline Phosphatase 03/19/2018 73  38 - 126 U/L Final  . Total Bilirubin 03/19/2018 1.0  0.3 - 1.2 mg/dL Final  . GFR calc non Af Amer 03/19/2018 54* >60 mL/min Final  . GFR calc Af Amer 03/19/2018 >60  >60 mL/min Final   Comment: (NOTE) The eGFR has been calculated using the CKD EPI equation. This calculation has not been validated in all clinical situations. eGFR's persistently <60 mL/min signify possible Chronic Kidney Disease.   Georgiann Hahn gap 03/19/2018 10  5 - 15 Final     Performed at [facility], [address] [place], [place] [address]  . B Natriuretic Peptide 03/19/2018 1,184.0* 0.0 - 100.0 pg/mL Final   Performed at [facility], [address] [place], [place] [address]  . Magnesium 03/19/2018 2.1  1.7 - 2.4 mg/dL Final   Performed at [facility], [address] [place], [place] [address]  . Troponin I 03/19/2018 0.03* <0.03 ng/mL Final   Comment: CRITICAL RESULT CALLED TO, READ BACK BY AND VERIFIED WITH [place] LOYE AT 1022 ON 03/19/18 [place]. Performed at [facility], [address] [place], [place] [address]   . Troponin I 03/19/2018 0.03* <0.03 ng/mL Final   Comment: CRITICAL VALUE NOTED. VALUE IS CONSISTENT WITH PREVIOUSLY REPORTED/CALLED VALUE KLW Performed at [facility], [address] [place], [place] [address]    There were no vitals filed for this visit. Wt Readings from Last 3 Encounters:  03/19/18 233 lb (105.7 kg)  02/19/18 244 lb (110.7 kg)  02/18/18 243 lb 12 oz (110.6 kg)    PHYSICAL EXAM: General:  Well appearing. No resp difficulty HEENT: normal Neck: supple. JVP 10 Carotids 2+ bilat; no bruits. No lymphadenopathy or thryomegaly appreciated. Cor: PMI nondisplaced. Regular rate & rhythm with frequent ectopy Lungs: clear Abdomen: soft, nontender, nondistended. No hepatosplenomegaly. No bruits or masses. Good bowel sounds. Extremities: no cyanosis, clubbing, rash, 1+ edema Neuro: alert & orientedx3, cranial nerves grossly intact. moves all 4 extremities  w/o difficulty. Affect pleasant   EKG: SR 83  LVH with frequent monomorphic PVCs in pattern of trigeminy Personally reviewed   ASSESSMENT & PLAN:  1. Chronic systolic HFwith cardiogenic shock: Due to NiCM. EF 20%.  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  Cardiac MRI 12/18 with EF 16%, severe LV dilation and small area of mid-wall LGE in the mid inferolateral wall.  Possible prior myocarditis as cause of cardiomyopathy with  PVCs contributing.  Consider cardiac sarcoidosis with LGE pattern but review of 9/18 CTA chest did not show signs of pulmonary sarcoidosis or hilar adenopathy.  Now off milrinone. Echo 10/18 EF 25%  - Stable NYHA II-III.   - Volume status elevated in setting of drinking too much water and Sprite. Increase torsemide to 40 bid. Start potassium 20 daily. - Continue  Coreg 6.25 mg BID.  - Increase entresto to 49/51 bid   - Continue spironolactone 25 daily. - Labs today and 1 week.  - Consider PET scan for sarcoid - Repeat echo   2. VF arrest on 12/24 at [facility]: S/p defibrillation x 1. Multiple runs VT overnight into 12/24/17. Improved with decreasing norepi and starting IV amio. Now has Medtronic ICD.  - No VT/VF on ICD interrogation. - Continue amio and mexilitene for now.  - Continue coreg as above.  - No driving x 6 months. Pt aware. June 2019    3. CKD III: -Recheck BMET today   4. Frequent PVCs:  - PVCs persist despite Amio and mexilitene  - Will discuss ablation with [name].  May need ablation.   Follow up in 4 weeks.   Glori Bickers, MD 03/26/18

## 2018-03-31 ENCOUNTER — Encounter (HOSPITAL_COMMUNITY): Payer: Self-pay | Admitting: *Deleted

## 2018-03-31 NOTE — Progress Notes (Signed)
Received record request from [facility].  Requested records faxed today to [phone].  Original request will be scanned to patient's electronic medical record.

## 2018-04-01 ENCOUNTER — Encounter: Payer: Commercial Managed Care - PPO | Attending: Cardiovascular Disease

## 2018-04-01 ENCOUNTER — Telehealth: Payer: Self-pay | Admitting: *Deleted

## 2018-04-01 ENCOUNTER — Encounter: Payer: Self-pay | Admitting: *Deleted

## 2018-04-01 DIAGNOSIS — I38 Endocarditis, valve unspecified: Secondary | ICD-10-CM | POA: Insufficient documentation

## 2018-04-01 DIAGNOSIS — I5022 Chronic systolic (congestive) heart failure: Secondary | ICD-10-CM | POA: Insufficient documentation

## 2018-04-01 NOTE — Telephone Encounter (Signed)
Called to check up on pt.  He has not attended since 3/14.  We have talked with his sister a few times, but he has not returned our calls. He saw Dr. Haroldine Laws last week, but there was not mention on rehab in his note.

## 2018-04-01 NOTE — Telephone Encounter (Signed)
Stephen [name] returned our call. 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  Last week, his weight was up again due to the steroids creating increased fluid. He is still wearing a brace on his hand.  We talked about coming back to exercise despite the brace.  He is eager to get back to rehab and hopes to return next week.

## 2018-04-02 ENCOUNTER — Other Ambulatory Visit
Admission: RE | Admit: 2018-04-02 | Discharge: 2018-04-02 | Disposition: A | Discharge status: Home / Self Care | Payer: Commercial Managed Care - PPO | Source: Ambulatory Visit | Attending: Internal Medicine | Admitting: Internal Medicine

## 2018-04-02 ENCOUNTER — Other Ambulatory Visit
Admission: RE | Admit: 2018-04-02 | Discharge: 2018-04-02 | Disposition: A | Discharge status: Home / Self Care | Payer: Commercial Managed Care - PPO | Source: Ambulatory Visit | Attending: Orthopedic Surgery | Admitting: Orthopedic Surgery

## 2018-04-02 DIAGNOSIS — M109 Gout, unspecified: Secondary | ICD-10-CM | POA: Diagnosis present

## 2018-04-02 DIAGNOSIS — I5022 Chronic systolic (congestive) heart failure: Secondary | ICD-10-CM | POA: Insufficient documentation

## 2018-04-02 LAB — BASIC METABOLIC PANEL
Anion gap: 9 (ref 5–15)
BUN: 21 mg/dL — AB (ref 6–20)
CO2: 28 mmol/L (ref 22–32)
Calcium: 9 mg/dL (ref 8.9–10.3)
Chloride: 106 mmol/L (ref 101–111)
Creatinine, Ser: 1.38 mg/dL — ABNORMAL HIGH (ref 0.61–1.24)
GFR calc Af Amer: >60 mL/min (ref >60)
GFR, EST NON AFRICAN AMERICAN: 57 mL/min — AB (ref >60)
Glucose, Bld: 108 mg/dL — ABNORMAL HIGH (ref 65–99)
Potassium: 3.9 mmol/L (ref 3.5–5.1)
SODIUM: 143 mmol/L (ref 135–145)

## 2018-04-02 LAB — URIC ACID: URIC ACID, SERUM: 13.4 mg/dL — AB (ref 4.4–7.6)

## 2018-04-16 ENCOUNTER — Encounter: Payer: Self-pay | Admitting: *Deleted

## 2018-04-16 DIAGNOSIS — I5022 Chronic systolic (congestive) heart failure: Secondary | ICD-10-CM

## 2018-04-16 NOTE — Progress Notes (Signed)
Cardiac Individual Treatment Plan  Patient Details  Name: Stephen [name] MRN: [id number] Date of Birth: 11/06/1966 Referring Provider:     Cardiac Rehab from 01/23/2018 in [facility] Cardiac and Pulmonary Rehab  Referring Provider  Arida      Initial Encounter Date:    Cardiac Rehab from 01/23/2018 in [facility] Cardiac and Pulmonary Rehab  Date  01/23/18  Referring Provider  Fletcher Anon      Visit Diagnosis: Heart failure, chronic systolic ([place])  Patient's Home Medications on Admission:  Current Outpatient Medications:  .  acetaminophen (TYLENOL) 325 MG tablet, Take 2 tablets (650 mg total) by mouth every 6 (six) hours as needed., Disp: 60 tablet, Rfl: 0 .  albuterol (PROVENTIL HFA;VENTOLIN HFA) 108 (90 Base) MCG/ACT inhaler, Inhale 2 puffs into the lungs every 4 (four) hours as needed for wheezing or shortness of breath., Disp: 1 Inhaler, Rfl: 0 .  amiodarone (PACERONE) 200 MG tablet, Take 200 mg by mouth daily., Disp: , Rfl:  .  aspirin EC 81 MG EC tablet, Take 1 tablet (81 mg total) by mouth daily., Disp: 90 tablet, Rfl: 3 .  atorvastatin (LIPITOR) 40 MG tablet, Take 1 tablet (40 mg total) by mouth daily at 6 PM., Disp: 30 tablet, Rfl: 6 .  Camphor-Eucalyptus-Menthol (CHEST RUB) 4.8-1.2-2.6 % OINT, Apply 1 application topically daily as needed (congestino). , Disp: , Rfl:  .  carvedilol (COREG) 6.25 MG tablet, Take 1 tablet (6.25 mg total) by mouth 2 (two) times daily with a meal., Disp: 60 tablet, Rfl: 6 .  clopidogrel (PLAVIX) 75 MG tablet, Take 1 tablet (75 mg total) by mouth daily., Disp: 30 tablet, Rfl: 5 .  diclofenac sodium (VOLTAREN) 1 % GEL, Apply 2 g topically 4 (four) times daily as needed (pain). , Disp: , Rfl:  .  docusate sodium (COLACE) 100 MG capsule, Take 1 capsule (100 mg total) by mouth 2 (two) times daily as needed for mild constipation., Disp: 10 capsule, Rfl: 0 .  fluticasone (FLONASE) 50 MCG/ACT nasal spray, Place 2 sprays into both nostrils daily. (Patient taking  differently: Place 2 sprays into both nostrils daily as needed for allergies. ), Disp: 16 g, Rfl: 2 .  losartan (COZAAR) 25 MG tablet, Take 25 mg by mouth 2 (two) times daily., Disp: , Rfl:  .  mexiletine (MEXITIL) 150 MG capsule, Take 1 capsule (150 mg total) by mouth 2 (two) times daily., Disp: 60 capsule, Rfl: 3 .  nitroGLYCERIN (NITROSTAT) 0.4 MG SL tablet, Place 1 tablet (0.4 mg total) under the tongue every 5 (five) minutes as needed for chest pain., Disp: 25 tablet, Rfl: 3 .  potassium chloride SA (K-DUR,KLOR-CON) 20 MEQ tablet, Take 1 tablet (20 mEq total) by mouth daily., Disp: 90 tablet, Rfl: 3 .  sacubitril-valsartan (ENTRESTO) 49-51 MG, Take 1 tablet by mouth 2 (two) times daily., Disp: 60 tablet, Rfl: 3 .  spironolactone (ALDACTONE) 25 MG tablet, Take 1 tablet (25 mg total) by mouth daily., Disp: 30 tablet, Rfl: 5 .  Tiotropium Bromide Monohydrate (SPIRIVA RESPIMAT) 2.5 MCG/ACT AERS, Inhale 1 Act into the lungs daily., Disp: , Rfl:  .  torsemide (DEMADEX) 20 MG tablet, Take 2 tablets (40 mg total) by mouth 2 (two) times daily., Disp: 120 tablet, Rfl: 3 .  traMADol (ULTRAM) 50 MG tablet, Take 1 tablet (50 mg total) by mouth every 6 (six) hours as needed., Disp: 8 tablet, Rfl: 0  Past Medical History: Past Medical History:  Diagnosis Date  . Arrhythmia   .  Cardiac arrest ([place]) 12/23/2017   Brief V-fib arrest  . CHF (congestive heart failure) (H[facility])    nonischemic cardiomyopathy, EF 25%  . Hypertension   . NICM (nonischemic cardiomyopathy) ([place])   . TIA (transient ischemic attack) 06/21/16    Tobacco Use: Social History   Tobacco Use  Smoking Status Former Smoker  . Packs/day: 0.25  . Years: 20.00  . Pack years: 5.00  . Types: Cigarettes  . Last attempt to quit: 02/20/2016  . Years since quitting: 2.1  Smokeless Tobacco Never Used    Labs: Recent Review Scientist, physiological    Labs for ITP Cardiac and Pulmonary Rehab Latest Ref Rng & Units 12/24/2017 12/25/2017 12/26/2017  12/26/2017 12/27/2017   Cholestrol 0 - 200 mg/dL - - - - -   LDLCALC 0 - 99 mg/dL - - - - -   HDL >40 mg/dL - - - - -   Trlycerides <150 mg/dL - - - - -   PHART 7.350 - 7.450 - - - - -   PCO2ART 32.0 - 48.0 mmHg - - - - -   HCO3 20.0 - 28.0 mmol/L - - - - -   TCO2 22 - 32 mmol/L - - - - -   ACIDBASEDEF 0.0 - 2.0 mmol/L - - - - -   O2SAT % 66.9 70.3 72.5 54.9 57.2       Exercise Target Goals:    Exercise Program Goal: Individual exercise prescription set using results from initial 6 min walk test and THRR while considering  patient's activity barriers and safety.   Exercise Prescription Goal: Initial exercise prescription builds to 30-45 minutes a day of aerobic activity, 2-3 days per week.  Home exercise guidelines will be given to patient during program as part of exercise prescription that the participant will acknowledge.  Activity Barriers & Risk Stratification: Activity Barriers & Cardiac Risk Stratification - 01/23/18 1354      Activity Barriers & Cardiac Risk Stratification   Activity Barriers  Joint Problems;Arthritis Both knees    Cardiac Risk Stratification  High       6 Minute Walk: 6 Minute Walk    Row Name 01/23/18 1412         6 Minute Walk   Distance  1050 feet     Walk Time  6 minutes     # of Rest Breaks  0     MPH  1.98     METS  3.07     RPE  11     Perceived Dyspnea   1     VO2 Peak  10.75     Symptoms  No     Resting HR  72 bpm     Resting BP  103/79     Resting Oxygen Saturation   98 %     Exercise Oxygen Saturation  during 6 min walk  97 %     Max Ex. HR  94 bpm     Max Ex. BP  112/68     2 Minute Post BP  96/56        Oxygen Initial Assessment:   Oxygen Re-Evaluation:   Oxygen Discharge (Final Oxygen Re-Evaluation):   Initial Exercise Prescription: Initial Exercise Prescription - 01/23/18 1400      Date of Initial Exercise RX and Referring Provider   Date  01/23/18    Referring Provider  Arida      Treadmill   MPH  2     Grade  1.5    Minutes  15    METs  2.85      Recumbant Bike   Level  2    RPM  60    Watts  37    Minutes  15    METs  3      REL-XR   Level  3    Speed  50    Minutes  15    METs  3      T5 Nustep   Level  2    SPM  80    Minutes  15    METs  3      Prescription Details   Frequency (times per week)  3    Duration  Progress to 30 minutes of continuous aerobic without signs/symptoms of physical distress      Intensity   THRR 40-80% of Max Heartrate  110-148    Ratings of Perceived Exertion  11-13    Perceived Dyspnea  0-4      Progression   Progression  Continue to progress workloads to maintain intensity without signs/symptoms of physical distress.      Resistance Training   Training Prescription  Yes    Weight  4 lb    Reps  10-15       Perform Capillary Blood Glucose checks as needed.  Exercise Prescription Changes: Exercise Prescription Changes    Row Name 01/23/18 1400 02/04/18 0900 02/05/18 1500 02/18/18 1600 03/04/18 1600     Response to Exercise   Blood Pressure (Admit)  103/79  -  130/80  144/64  126/60   Blood Pressure (Exercise)  112/68  -  150/80  136/74  132/74   Blood Pressure (Exit)  96/56  -  108/56  138/60  126/60   Heart Rate (Admit)  72 bpm  -  81 bpm  109 bpm  80 bpm   Heart Rate (Exercise)  94 bpm  -  125 bpm  95 bpm  108 bpm   Heart Rate (Exit)  71 bpm  -  101 bpm  81 bpm  91 bpm   Oxygen Saturation (Admit)  98 %  -  -  -  -   Oxygen Saturation (Exit)  97 %  -  -  -  -   Rating of Perceived Exertion (Exercise)  11  -  _0 Symptoms  -  -  none  none  none   Comments  -  -  second full day of exercise  -  -   Duration  -  -  Continue with 30 min of aerobic exercise without signs/symptoms of physical distress.  Continue with 30 min of aerobic exercise without signs/symptoms of physical distress.  Continue with 30 min of aerobic exercise without signs/symptoms of physical distress.   Intensity  -  -  THRR unchanged  THRR  unchanged  THRR unchanged     Progression   Progression  -  -  Continue to progress workloads to maintain intensity without signs/symptoms of physical distress.  Continue to progress workloads to maintain intensity without signs/symptoms of physical distress.  Continue to progress workloads to maintain intensity without signs/symptoms of physical distress.   Average METs  -  -  2.76  3.35  3.45     Resistance Training   Training Prescription  -  -  Yes  Yes  Yes   Weight  -  -  4 lbs  4 lbs  4 lbs   Reps  -  -  10-15  10-15  10-15     Interval Training   Interval Training  -  -  No  No  No     Treadmill   MPH  -  -  2  2  -   Grade  -  -  1.5  1.5  -   Minutes  -  -  15  15  -   METs  -  -  2.85  2.85  -     Recumbant Bike   Level  -  -  -  2  3   Minutes  -  -  -  15  15   METs  -  -  -  4.4  3.9     T5 Nustep   Level  -  -  _0 Minutes  -  -  _1 METs  -  -  2.7  2.8  3     Home Exercise Plan   Plans to continue exercise at  -  Home (comment) walking  Home (comment) walking  Home (comment) walking  Home (comment) walking   Frequency  -  Add 3 additional days to program exercise sessions.  Add 3 additional days to program exercise sessions.  Add 3 additional days to program exercise sessions.  Add 3 additional days to program exercise sessions.   Initial Home Exercises Provided  -  02/04/18  02/04/18  02/04/18  02/04/18   Row Name 03/18/18 1500             Response to Exercise   Blood Pressure (Admit)  98/60       Blood Pressure (Exercise)  120/68       Blood Pressure (Exit)  92/58       Heart Rate (Admit)  75 bpm       Heart Rate (Exercise)  86 bpm       Heart Rate (Exit)  72 bpm       Rating of Perceived Exertion (Exercise)  13       Symptoms  none       Duration  Continue with 30 min of aerobic exercise without signs/symptoms of physical distress.       Intensity  THRR unchanged         Progression   Progression  Continue to progress workloads  to maintain intensity without signs/symptoms of physical distress.       Average METs  3.15         Resistance Training   Training Prescription  Yes       Weight  4 lbs       Reps  10-15         Interval Training   Interval Training  No         Treadmill   MPH  2       Grade  1.5       Minutes  15       METs  2.85         Recumbant Bike   Level  3       Minutes  15       METs  3.7         T5 Nustep   Level  3       Minutes  15  METs  2.8         Home Exercise Plan   Plans to continue exercise at  Home (comment) walking       Frequency  Add 3 additional days to program exercise sessions.       Initial Home Exercises Provided  02/04/18          Exercise Comments: Exercise Comments    Row Name 01/30/18 0919 03/04/18 0839         Exercise Comments   First full day of exercise!  Patient was oriented to gym and equipment including functions, settings, policies, and procedures.  Patient's individual exercise prescription and treatment plan were reviewed.  All starting workloads were established based on the results of the 6 minute walk test done at initial orientation visit.  The plan for exercise progression was also introduced and progression will be customized based on patient's performance and goals.  [name] didn't have a ride here last week.  He came in at 6:30 am today to be able to attend.  He is walking at home when he cant attend class.         Exercise Goals and Review: Exercise Goals    Row Name 01/23/18 1412             Exercise Goals   Increase Physical Activity  Yes       Intervention  Provide advice, education, support and counseling about physical activity/exercise needs.;Develop an individualized exercise prescription for aerobic and resistive training based on initial evaluation findings, risk stratification, comorbidities and participant's personal goals.       Expected Outcomes  Short Term: Attend rehab on a regular basis to increase amount of  physical activity.;Long Term: Add in home exercise to make exercise part of routine and to increase amount of physical activity.;Long Term: Exercising regularly at least 3-5 days a week.       Increase Strength and Stamina  Yes       Intervention  Provide advice, education, support and counseling about physical activity/exercise needs.;Develop an individualized exercise prescription for aerobic and resistive training based on initial evaluation findings, risk stratification, comorbidities and participant's personal goals.       Expected Outcomes  Short Term: Increase workloads from initial exercise prescription for resistance, speed, and METs.;Short Term: Perform resistance training exercises routinely during rehab and add in resistance training at home;Long Term: Improve cardiorespiratory fitness, muscular endurance and strength as measured by increased METs and functional capacity (6MWT)       Able to understand and use rate of perceived exertion (RPE) scale  Yes       Intervention  Provide education and explanation on how to use RPE scale       Expected Outcomes  Short Term: Able to use RPE daily in rehab to express subjective intensity level;Long Term:  Able to use RPE to guide intensity level when exercising independently       Able to understand and use Dyspnea scale  Yes       Intervention  Provide education and explanation on how to use Dyspnea scale       Expected Outcomes  Short Term: Able to use Dyspnea scale daily in rehab to express subjective sense of shortness of breath during exertion;Long Term: Able to use Dyspnea scale to guide intensity level when exercising independently       Knowledge and understanding of Target Heart Rate Range (THRR)  Yes       Intervention  Provide education and explanation of THRR including how the numbers were predicted and where they are located for reference       Expected Outcomes  Short Term: Able to state/look up THRR;Long Term: Able to use THRR to govern  intensity when exercising independently;Short Term: Able to use daily as guideline for intensity in rehab       Able to check pulse independently  Yes       Intervention  Provide education and demonstration on how to check pulse in carotid and radial arteries.;Review the importance of being able to check your own pulse for safety during independent exercise       Expected Outcomes  Short Term: Able to explain why pulse checking is important during independent exercise;Long Term: Able to check pulse independently and accurately       Understanding of Exercise Prescription  Yes       Intervention  Provide education, explanation, and written materials on patient's individual exercise prescription       Expected Outcomes  Short Term: Able to explain program exercise prescription;Long Term: Able to explain home exercise prescription to exercise independently          Exercise Goals Re-Evaluation : Exercise Goals Re-Evaluation    Row Name 01/30/18 0906 02/04/18 0947 02/18/18 1630 03/04/18 1618 03/18/18 1555     Exercise Goal Re-Evaluation   Exercise Goals Review  Understanding of Exercise Prescription;Knowledge and understanding of Target Heart Rate Range (THRR);Able to understand and use rate of perceived exertion (RPE) scale  Understanding of Exercise Prescription;Increase Physical Activity;Increase Strength and Stamina;Knowledge and understanding of Target Heart Rate Range (THRR);Able to understand and use rate of perceived exertion (RPE) scale;Able to check pulse independently  Increase Physical Activity;Understanding of Exercise Prescription;Increase Strength and Stamina  Increase Physical Activity;Understanding of Exercise Prescription;Increase Strength and Stamina  Increase Physical Activity;Understanding of Exercise Prescription;Increase Strength and Stamina   Comments  Reviewed RPE scale, THR and program prescription with pt today.  Pt voiced understanding and was given a copy of goals to take  home.   Reviewed home exercise with pt today.  Pt plans to walking at home for exercise. He has been doing 1/2 mile and plans to increase to 30 minutes. Reviewed THR, pulse, RPE, sign and symptoms, NTG use, and when to call 911 or MD.  Also discussed weather considerations and indoor options.  Pt voiced understanding.  [name] has been doing well in rehab.  He has started to increase his walking time at home up to 62mn.  He has worked his way up to 4.4 METs on the recumbent bike.  We will continue to monitor his progress.   TOctavia Brucknercontinues to do well in rehab when he is able to get here.  He missed a little over a week but returned at 3.9 METs on the bike.  We will continue to monitor his progression.   TOctavia Brucknerhas been doing well in rehab.  He is up to 2.8 METs on the NuStep.  We will continue to monitor his progression.   Expected Outcomes  Short: Use RPE daily to regulate intensity.  Long: Follow program prescription in THR.  Short: Increase time of walk to 379m at home and going at least 3 days a week.  Long: Continue to work on increasing strength and stamina   Short: Increase workload on treadmill more.   Long: Continue to exercise on off days.   Short: Increase workload on treadmill and T5 NuStep.  Long: Continue to  exercise on days off.   Short: Increase bike and NuStep.  Long: Continue to exercise when not here.       Discharge Exercise Prescription (Final Exercise Prescription Changes): Exercise Prescription Changes - 03/18/18 1500      Response to Exercise   Blood Pressure (Admit)  98/60    Blood Pressure (Exercise)  120/68    Blood Pressure (Exit)  92/58    Heart Rate (Admit)  75 bpm    Heart Rate (Exercise)  86 bpm    Heart Rate (Exit)  72 bpm    Rating of Perceived Exertion (Exercise)  13    Symptoms  none    Duration  Continue with 30 min of aerobic exercise without signs/symptoms of physical distress.    Intensity  THRR unchanged      Progression   Progression  Continue to progress  workloads to maintain intensity without signs/symptoms of physical distress.    Average METs  3.15      Resistance Training   Training Prescription  Yes    Weight  4 lbs    Reps  10-15      Interval Training   Interval Training  No      Treadmill   MPH  2    Grade  1.5    Minutes  15    METs  2.85      Recumbant Bike   Level  3    Minutes  15    METs  3.7      T5 Nustep   Level  3    Minutes  15    METs  2.8      Home Exercise Plan   Plans to continue exercise at  Home (comment) walking    Frequency  Add 3 additional days to program exercise sessions.    Initial Home Exercises Provided  02/04/18       Nutrition:  Target Goals: Understanding of nutrition guidelines, daily intake of sodium <1549m, cholesterol <2019m calories 30% from fat and 7% or less from saturated fats, daily to have 5 or more servings of fruits and vegetables.  Biometrics: Pre Biometrics - 01/23/18 1411      Pre Biometrics   Height  6' 0.75" (1.848 m)    Weight  236 lb 11.2 oz (107.4 kg)    Waist Circumference  40 inches    Hip Circumference  44.5 inches    Waist to Hip Ratio  0.9 %    BMI (Calculated)  31.44    Single Leg Stand  17.5 seconds        Nutrition Therapy Plan and Nutrition Goals: Nutrition Therapy & Goals - 02/13/18 0919      Nutrition Therapy   Diet  DASH    Drug/Food Interactions  Statins/Certain Fruits    Protein (specify units)  10oz    Fiber  35 grams    Saturated Fats  16 max. grams    Fruits and Vegetables  5 servings/day    Sodium  2000 grams      Personal Nutrition Goals   Nutrition Goal  Choose frozen vegetables rather than canned    Personal Goal #2  Aim for 6-8 bottles of water per day; monitor fluid intake vs. sodium intake daily    Personal Goal #3  When you feel up to it, try to cook at home a couple of times per week rather than buying prepared meals to help reduce dietary sodium intake  Comments  per pt, following a 2071m/day sodium restriction  but estimates current daily sodium intake to be 7-8018mday      Intervention Plan   Intervention  Prescribe, educate and counsel regarding individualized specific dietary modifications aiming towards targeted core components such as weight, hypertension, lipid management, diabetes, heart failure and other comorbidities.    Expected Outcomes  Short Term Goal: Understand basic principles of dietary content, such as calories, fat, sodium, cholesterol and nutrients.;Short Term Goal: A plan has been developed with personal nutrition goals set during dietitian appointment.;Long Term Goal: Adherence to prescribed nutrition plan.       Nutrition Assessments: Nutrition Assessments - 01/23/18 1141      MEDFICTS Scores   Pre Score  18       Nutrition Goals Re-Evaluation: Nutrition Goals Re-Evaluation    Row Name 03/04/18 [id number]           Goals   Comment  [name] is eating less fried food and more fruits and vegetables.  He does eat during the night sometimes.   We spoke about using a food tracker to keep accurate measures of calories and sodium       Expected Outcome  Short - [name] will use a food tracker to monitor dietary intake Long - [name] will meet with RD and add her suggestions          Nutrition Goals Discharge (Final Nutrition Goals Re-Evaluation): Nutrition Goals Re-Evaluation - 03/04/18 0833      Goals   Comment  [name] is eating less fried food and more fruits and vegetables.  He does eat during the night sometimes.   We spoke about using a food tracker to keep accurate measures of calories and sodium    Expected Outcome  Short - [name] will use a food tracker to monitor dietary intake Long - [name] will meet with RD and add her suggestions       Psychosocial: Target Goals: Acknowledge presence or absence of significant depression and/or stress, maximize coping skills, provide positive support system. Participant is able to verbalize types and ability to use techniques and skills needed for  reducing stress and depression.   Initial Review & Psychosocial Screening: Initial Psych Review & Screening - 01/23/18 1348      Initial Review   Current issues with  Current Sleep Concerns;Current Stress Concerns    Source of Stress Concerns  Unable to participate in former interests or hobbies    Comments  [name] reports his unhealthy sleep habits are stemming from him being out of work temporarily and not on a usual routine. He is very motivated to work on his health and then go back to work.       Family Dynamics   Good Support System?  Yes      Screening Interventions   Interventions  Encouraged to exercise;Program counselor consult;To provide support and resources with identified psychosocial needs    Expected Outcomes  Long Term Goal: Stressors or current issues are controlled or eliminated.;Short Term goal: Utilizing psychosocial counselor, staff and physician to assist with identification of specific Stressors or current issues interfering with healing process. Setting desired goal for each stressor or current issue identified.;Short Term goal: Identification and review with participant of any Quality of Life or Depression concerns found by scoring the questionnaire.;Long Term goal: The participant improves quality of Life and PHQ9 Scores as seen by post scores and/or verbalization of changes       Quality of Life  Scores:  Quality of Life - 01/23/18 1141      Quality of Life Scores   Health/Function Pre  23.63 %    Socioeconomic Pre  27.5 %    Psych/Spiritual Pre  30 %    Family Pre  28.13 %    GLOBAL Pre  26.59 %      Scores of 19 and below usually indicate a poorer quality of life in these areas.  A difference of  2-3 points is a clinically meaningful difference.  A difference of 2-3 points in the total score of the Quality of Life Index has been associated with significant improvement in overall quality of life, self-image, physical symptoms, and general health in studies  assessing change in quality of life.  PHQ-9: Recent Review Flowsheet Data    Depression screen [facility] 2/9 01/23/2018 10/03/2017 09/30/2017 08/29/2017 07/02/2017   Decreased Interest 1 0 0 0 0   Down, Depressed, Hopeless 0 0 0 0 0   PHQ - 2 Score 1 0 0 0 0   Altered sleeping 2 - - - -   Tired, decreased energy 1 - - - -   Change in appetite 1 - - - -   Feeling bad or failure about yourself  0 - - - -   Trouble concentrating 0 - - - -   Moving slowly or fidgety/restless 0 - - - -   Suicidal thoughts 0 - - - -   PHQ-9 Score 5 - - - -   Difficult doing work/chores Not difficult at all - - - -     Interpretation of Total Score  Total Score Depression Severity:  1-4 = Minimal depression, 5-9 = Mild depression, 10-14 = Moderate depression, 15-19 = Moderately severe depression, 20-27 = Severe depression   Psychosocial Evaluation and Intervention:   Psychosocial Re-Evaluation: Psychosocial Re-Evaluation    Row Name 02/18/18 0847 03/04/18 0840           Psychosocial Re-Evaluation   Current issues with  None Identified  None Identified      Comments  -  [name] didnt have a ride to attend last week.  He rode with a friend today and got here at 6:30 am in order to attend class!      Expected Outcomes  -  Short - [name] will be able to find a ride Long - [name] will be able to complete HT      Interventions  Encouraged to attend Cardiac Rehabilitation for the exercise  Encouraged to attend Cardiac Rehabilitation for the exercise      Continue Psychosocial Services   Follow up required by staff  -         Psychosocial Discharge (Final Psychosocial Re-Evaluation): Psychosocial Re-Evaluation - 03/04/18 0840      Psychosocial Re-Evaluation   Current issues with  None Identified    Comments  [name] didnt have a ride to attend last week.  He rode with a friend today and got here at 6:30 am in order to attend class!    Expected Outcomes  Short - [name] will be able to find a ride Long - [name] will be able to  complete HT    Interventions  Encouraged to attend Cardiac Rehabilitation for the exercise       Vocational Rehabilitation: Provide vocational rehab assistance to qualifying candidates.   Vocational Rehab Evaluation & Intervention: Vocational Rehab - 01/23/18 1352      Initial Vocational Rehab Evaluation & Intervention  Assessment shows need for Vocational Rehabilitation  No       Education: Education Goals: Education classes will be provided on a variety of topics geared toward better understanding of heart health and risk factor modification. Participant will state understanding/return demonstration of topics presented as noted by education test scores.  Learning Barriers/Preferences: Learning Barriers/Preferences - 01/23/18 1413      Learning Barriers/Preferences   Learning Barriers  None    Learning Preferences  Individual Instruction;Group Instruction;Skilled Demonstration       Education Topics:  AED/CPR: - Group verbal and written instruction with the use of models to demonstrate the basic use of the AED with the basic ABC's of resuscitation.   Cardiac Rehab from 03/13/2018 in [facility] Cardiac and Pulmonary Rehab  Date  02/11/18  Educator  SB  Instruction Review Code  1- Verbalizes Understanding      General Nutrition Guidelines/Fats and Fiber: -Group instruction provided by verbal, written material, models and posters to present the general guidelines for heart healthy nutrition. Gives an explanation and review of dietary fats and fiber.   Controlling Sodium/Reading Food Labels: -Group verbal and written material supporting the discussion of sodium use in heart healthy nutrition. Review and explanation with models, verbal and written materials for utilization of the food label.   Cardiac Rehab from 03/13/2018 in [facility] Cardiac and Pulmonary Rehab  Date  02/04/18  Educator  CR  Instruction Review Code  1- Verbalizes Understanding      Exercise Physiology & General  Exercise Guidelines: - Group verbal and written instruction with models to review the exercise physiology of the cardiovascular system and associated critical values. Provides general exercise guidelines with specific guidelines to those with heart or lung disease.    Cardiac Rehab from 03/13/2018 in [facility] Cardiac and Pulmonary Rehab  Date  02/20/18  Educator  [facility]  Instruction Review Code  1- Verbalizes Understanding      Aerobic Exercise & Resistance Training: - Gives group verbal and written instruction on the various components of exercise. Focuses on aerobic and resistive training programs and the benefits of this training and how to safely progress through these programs..   Flexibility, Balance, Mind/Body Relaxation: Provides group verbal/written instruction on the benefits of flexibility and balance training, including mind/body exercise modes such as yoga, pilates and tai chi.  Demonstration and skill practice provided.   Stress and Anxiety: - Provides group verbal and written instruction about the health risks of elevated stress and causes of high stress.  Discuss the correlation between heart/lung disease and anxiety and treatment options. Review healthy ways to manage with stress and anxiety.   Cardiac Rehab from 03/13/2018 in [facility] Cardiac and Pulmonary Rehab  Date  03/04/18  Educator  [facility]  Instruction Review Code  1- Verbalizes Understanding      Depression: - Provides group verbal and written instruction on the correlation between heart/lung disease and depressed mood, treatment options, and the stigmas associated with seeking treatment.   Cardiac Rehab from 03/13/2018 in [facility] Cardiac and Pulmonary Rehab  Date  02/18/18  Educator  [facility]  Instruction Review Code  1- Verbalizes Understanding      Anatomy & Physiology of the Heart: - Group verbal and written instruction and models provide basic cardiac anatomy and physiology, with the coronary electrical and arterial systems.  Review of Valvular disease and Heart Failure   Cardiac Procedures: - Group verbal and written instruction to review commonly prescribed medications for heart disease. Reviews the medication, class of the  drug, and side effects. Includes the steps to properly store meds and maintain the prescription regimen. (beta blockers and nitrates)   Cardiac Medications I: - Group verbal and written instruction to review commonly prescribed medications for heart disease. Reviews the medication, class of the drug, and side effects. Includes the steps to properly store meds and maintain the prescription regimen.   Cardiac Rehab from 03/13/2018 in [facility] Cardiac and Pulmonary Rehab  Date  03/11/18  Educator  SB  Instruction Review Code  1- Verbalizes Understanding      Cardiac Medications II: -Group verbal and written instruction to review commonly prescribed medications for heart disease. Reviews the medication, class of the drug, and side effects. (all other drug classes)   Cardiac Rehab from 03/13/2018 in [facility] Cardiac and Pulmonary Rehab  Date  03/06/18  Educator  [facility]  Instruction Review Code  1- Verbalizes Understanding       Go Sex-Intimacy & Heart Disease, Get SMART - Goal Setting: - Group verbal and written instruction through game format to discuss heart disease and the return to sexual intimacy. Provides group verbal and written material to discuss and apply goal setting through the application of the S.M.A.R.T. Method.   Other Matters of the Heart: - Provides group verbal, written materials and models to describe Stable Angina and Peripheral Artery. Includes description of the disease process and treatment options available to the cardiac patient.   Exercise & Equipment Safety: - Individual verbal instruction and demonstration of equipment use and safety with use of the equipment.   Cardiac Rehab from 03/13/2018 in [facility] Cardiac and Pulmonary Rehab  Date  01/23/18  Educator  [facility]  Instruction  Review Code  1- Verbalizes Understanding      Infection Prevention: - Provides verbal and written material to individual with discussion of infection control including proper hand washing and proper equipment cleaning during exercise session.   Cardiac Rehab from 03/13/2018 in [facility] Cardiac and Pulmonary Rehab  Date  01/23/18  Educator  [facility]  Instruction Review Code  1- Verbalizes Understanding      Falls Prevention: - Provides verbal and written material to individual with discussion of falls prevention and safety.   Cardiac Rehab from 03/13/2018 in [facility] Cardiac and Pulmonary Rehab  Date  01/23/18  Educator  [facility]  Instruction Review Code  1- Verbalizes Understanding      Diabetes: - Individual verbal and written instruction to review signs/symptoms of diabetes, desired ranges of glucose level fasting, after meals and with exercise. Acknowledge that pre and post exercise glucose checks will be done for 3 sessions at entry of program.   Know Your Numbers and Risk Factors: -Group verbal and written instruction about important numbers in your health.  Discussion of what are risk factors and how they play a role in the disease process.  Review of Cholesterol, Blood Pressure, Diabetes, and BMI and the role they play in your overall health.   Cardiac Rehab from 03/13/2018 in [facility] Cardiac and Pulmonary Rehab  Date  03/06/18  Educator  [facility]  Instruction Review Code  1- Verbalizes Understanding      Sleep Hygiene: -Provides group verbal and written instruction about how sleep can affect your health.  Define sleep hygiene, discuss sleep cycles and impact of sleep habits. Review good sleep hygiene tips.    Cardiac Rehab from 03/13/2018 in [facility] Cardiac and Pulmonary Rehab  Date  01/30/18  Educator  [facility]  Instruction Review Code  1- Verbalizes Understanding  Other: -Provides group and verbal instruction on various topics (see comments)   Knowledge Questionnaire Score: Knowledge  Questionnaire Score - 01/23/18 1141      Knowledge Questionnaire Score   Pre Score  21/28 correct answers reviewed with [name]       Core Components/Risk Factors/Patient Goals at Admission: Personal Goals and Risk Factors at Admission - 01/23/18 1342      Core Components/Risk Factors/Patient Goals on Admission    Weight Management  Yes;Weight Loss    Intervention  Weight Management: Develop a combined nutrition and exercise program designed to reach desired caloric intake, while maintaining appropriate intake of nutrient and fiber, sodium and fats, and appropriate energy expenditure required for the weight goal.;Weight Management: Provide education and appropriate resources to help participant work on and attain dietary goals.;Weight Management/Obesity: Establish reasonable short term and long term weight goals.    Admit Weight  230 lb (104.3 kg)    Goal Weight: Short Term  225 lb (102.1 kg)    Goal Weight: Long Term  225 lb (102.1 kg)    Expected Outcomes  Short Term: Continue to assess and modify interventions until short term weight is achieved;Long Term: Adherence to nutrition and physical activity/exercise program aimed toward attainment of established weight goal;Weight Maintenance: Understanding of the daily nutrition guidelines, which includes 25-35% calories from fat, 7% or less cal from saturated fats, less than 259m cholesterol, less than 1.5gm of sodium, & 5 or more servings of fruits and vegetables daily;Weight Loss: Understanding of general recommendations for a balanced deficit meal plan, which promotes 1-2 lb weight loss per week and includes a negative energy balance of [phone] kcal/d;Understanding recommendations for meals to include 15-35% energy as protein, 25-35% energy from fat, 35-60% energy from carbohydrates, less than 2031mof dietary cholesterol, 20-35 gm of total fiber daily;Understanding of distribution of calorie intake throughout the day with the consumption of 4-5  meals/snacks    Heart Failure  Yes    Intervention  Provide a combined exercise and nutrition program that is supplemented with education, support and counseling about heart failure. Directed toward relieving symptoms such as shortness of breath, decreased exercise tolerance, and extremity edema.    Expected Outcomes  Improve functional capacity of life;Short term: Attendance in program 2-3 days a week with increased exercise capacity. Reported lower sodium intake. Reported increased fruit and vegetable intake. Reports medication compliance.;Short term: Daily weights obtained and reported for increase. Utilizing diuretic protocols set by physician.;Long term: Adoption of self-care skills and reduction of barriers for early signs and symptoms recognition and intervention leading to self-care maintenance.    Lipids  Yes    Intervention  Provide education and support for participant on nutrition & aerobic/resistive exercise along with prescribed medications to achieve LDL <7079mHDL >35m3m  Expected Outcomes  Short Term: Participant states understanding of desired cholesterol values and is compliant with medications prescribed. Participant is following exercise prescription and nutrition guidelines.;Long Term: Cholesterol controlled with medications as prescribed, with individualized exercise RX and with personalized nutrition plan. Value goals: LDL < 70mg46mL > 40 mg.    Stress  Yes [name] is currently out of work until the end of February. He stated he does not want to go back to work until he is 100% better. His health is his main concern now.     Intervention  Offer individual and/or small group education and counseling on adjustment to heart disease, stress management and health-related lifestyle change. Teach and support self-help strategies.;Refer  participants experiencing significant psychosocial distress to appropriate mental health specialists for further evaluation and treatment. When possible,  include family members and significant others in education/counseling sessions.    Expected Outcomes  Short Term: Participant demonstrates changes in health-related behavior, relaxation and other stress management skills, ability to obtain effective social support, and compliance with psychotropic medications if prescribed.;Long Term: Emotional wellbeing is indicated by absence of clinically significant psychosocial distress or social isolation.       Core Components/Risk Factors/Patient Goals Review:  Goals and Risk Factor Review    Row Name 02/18/18 0841 03/04/18 0838           Core Components/Risk Factors/Patient Goals Review   Personal Goals Review  Weight Management/Obesity;Hypertension;Lipids  Hypertension;Heart Failure;Lipids      Review  [name] is taking meds as directed - BP has been good.  He is seeing his cardiologist today.  He is walking 2 miles at home on days not at [facility].  He is feeling better overall.  He is eating darker lettuce in his salad.  He is drinking water regularly.    [name] is taking meds as directed.  No new changes      Expected Outcomes  Short - [name] will continue to exercise on his own and follow up with RD about nutrition  Long - [name] will maintain exercise and healthy eating - he will meet with RD   Short - [name] will continue to exercise and take meds as directed  Long - [name] will control CHF and symptoms         Core Components/Risk Factors/Patient Goals at Discharge (Final Review):  Goals and Risk Factor Review - 03/04/18 0838      Core Components/Risk Factors/Patient Goals Review   Personal Goals Review  Hypertension;Heart Failure;Lipids    Review  [name] is taking meds as directed.  No new changes    Expected Outcomes  Short - [name] will continue to exercise and take meds as directed  Long - [name] will control CHF and symptoms       ITP Comments: ITP Comments    Row Name 01/23/18 1332 01/29/18 0623 02/26/18 0636 03/11/18 1022 03/24/18 1015   ITP Comments   Med review completed. Initial ITP created. Diagnosis can be found CHL 12/23/17  30 Day review. Continue with ITP unless directed changes per Medical Director review.  Starts sessions on 1/31  30 day review completed. Continue with ITP unless diercted changes per Medical Director.  Collie Siad called Dr Fletcher Anon for [name] since his weight was up - he had no symptoms  [name]'s sister called to see if he was still able to come to rehab.  She said that he fractured his wrist in rehab last week, but he has not attended since 3/14.  We talked with her about having him return to rehab, but that we would not use that arm until healed. She was amenable to this.    [place] Name 03/26/18 0639 04/01/18 1520 04/01/18 1535 04/16/18 0920     ITP Comments  30 Day review. Continue with ITP unless directed changes per Medical Director review.  3/14 last visit  Called to check up on pt.  He has not attended since 3/14.  We have talked with his sister a few times, but he has not returned our calls. He saw Dr. Haroldine Laws last week, but there was not mention on rehab in his note.   [name] returned our call. 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  Last week, his weight was up again due to the steroids creating increased fluid. He is still wearing a brace on his hand.  We talked about coming back to exercise despite the brace.  He is eager to get back to rehab and hopes to return next week.   [name] has been out since 03/13/18 and has not returned despite phone calls.  Sent letter to discharge. Discharge ITP sent to Dr. Sabra Heck and discharge summary sent to pcp and referring provider.        Comments: Discharge ITP

## 2018-04-16 NOTE — Progress Notes (Signed)
Discharge Progress Report  Patient Details  Name: Stephen [name] MRN: [id number] Date of Birth: [birth year] Referring Provider:     Cardiac Rehab from 01/23/2018 in [facility]  Referring Provider  [place]       Number of Visits: 21/36  Reason for Discharge:  Early Exit:  Lack of attendance  Smoking History:  Social History   Tobacco Use  Smoking Status Former Smoker  . Packs/day: 0.25  . Years: 20.00  . Pack years: 5.00  . Types: Cigarettes  . Last attempt to quit: 02/20/2016  . Years since quitting: 2.1  Smokeless Tobacco Never Used    Diagnosis:  Heart failure, chronic systolic (HCC)  ADL UCSD:   Initial Exercise Prescription: Initial Exercise Prescription - 01/23/18 1400      Date of Initial Exercise RX and Referring Provider   Date  01/23/18    Referring Provider  Arida      Treadmill   MPH  2    Grade  1.5    Minutes  15    METs  2.85      Recumbant Bike   Level  2    RPM  60    Watts  37    Minutes  15    METs  3      REL-XR   Level  3    Speed  50    Minutes  15    METs  3      T5 Nustep   Level  2    SPM  80    Minutes  15    METs  3      Prescription Details   Frequency (times per week)  3    Duration  Progress to 30 minutes of continuous aerobic without signs/symptoms of physical distress      Intensity   THRR 40-80% of Max Heartrate  110-148    Ratings of Perceived Exertion  11-13    Perceived Dyspnea  0-4      Progression   Progression  Continue to progress workloads to maintain intensity without signs/symptoms of physical distress.      Resistance Training   Training Prescription  Yes    Weight  4 lb    Reps  10-15       Discharge Exercise Prescription (Final Exercise Prescription Changes): Exercise Prescription Changes - 03/18/18 1500      Response to Exercise   Blood Pressure (Admit)  98/60    Blood Pressure (Exercise)  120/68    Blood Pressure (Exit)  92/58    Heart Rate (Admit)  75 bpm     Heart Rate (Exercise)  86 bpm    Heart Rate (Exit)  72 bpm    Rating of Perceived Exertion (Exercise)  13    Symptoms  none    Duration  Continue with 30 min of aerobic exercise without signs/symptoms of physical distress.    Intensity  THRR unchanged      Progression   Progression  Continue to progress workloads to maintain intensity without signs/symptoms of physical distress.    Average METs  3.15      Resistance Training   Training Prescription  Yes    Weight  4 lbs    Reps  10-15      Interval Training   Interval Training  No      Treadmill   MPH  2    Grade  1.5    Minutes  15    METs  2.85      Recumbant Bike   Level  3    Minutes  15    METs  3.7      T5 Nustep   Level  3    Minutes  15    METs  2.8      Home Exercise Plan   Plans to continue exercise at  Home (comment) walking    Frequency  Add 3 additional days to program exercise sessions.    Initial Home Exercises Provided  02/04/18       Functional Capacity: 6 Minute Walk    Row Name 01/23/18 1412         6 Minute Walk   Distance  1050 feet     Walk Time  6 minutes     # of Rest Breaks  0     MPH  1.98     METS  3.07     RPE  11     Perceived Dyspnea   1     VO2 Peak  10.75     Symptoms  No     Resting HR  72 bpm     Resting BP  103/79     Resting Oxygen Saturation   98 %     Exercise Oxygen Saturation  during 6 min walk  97 %     Max Ex. HR  94 bpm     Max Ex. BP  112/68     2 Minute Post BP  96/56        Psychological, QOL, Others - Outcomes: PHQ 2/9: Depression screen [facility] 2/9 01/23/2018 10/03/2017 09/30/2017 08/29/2017 07/02/2017  Decreased Interest 1 0 0 0 0  Down, Depressed, Hopeless 0 0 0 0 0  PHQ - 2 Score 1 0 0 0 0  Altered sleeping 2 - - - -  Tired, decreased energy 1 - - - -  Change in appetite 1 - - - -  Feeling bad or failure about yourself  0 - - - -  Trouble concentrating 0 - - - -  Moving slowly or fidgety/restless 0 - - - -  Suicidal thoughts 0 - - - -  PHQ-9  Score 5 - - - -  Difficult doing work/chores Not difficult at all - - - -    Quality of Life: Quality of Life - 01/23/18 1141      Quality of Life Scores   Health/Function Pre  23.63 %    Socioeconomic Pre  27.5 %    Psych/Spiritual Pre  30 %    Family Pre  28.13 %    GLOBAL Pre  26.59 %       Personal Goals: Goals established at orientation with interventions provided to work toward goal. Personal Goals and Risk Factors at Admission - 01/23/18 1342      Core Components/Risk Factors/Patient Goals on Admission    Weight Management  Yes;Weight Loss    Intervention  Weight Management: Develop a combined nutrition and exercise program designed to reach desired caloric intake, while maintaining appropriate intake of nutrient and fiber, sodium and fats, and appropriate energy expenditure required for the weight goal.;Weight Management: Provide education and appropriate resources to help participant work on and attain dietary goals.;Weight Management/Obesity: Establish reasonable short term and long term weight goals.    Admit Weight  230 lb (104.3 kg)    Goal Weight: Short Term  225 lb (102.1 kg)  Goal Weight: Long Term  225 lb (102.1 kg)    Expected Outcomes  Short Term: Continue to assess and modify interventions until short term weight is achieved;Long Term: Adherence to nutrition and physical activity/exercise program aimed toward attainment of established weight goal;Weight Maintenance: Understanding of the daily nutrition guidelines, which includes 25-35% calories from fat, 7% or less cal from saturated fats, less than 200mg  cholesterol, less than 1.5gm of sodium, & 5 or more servings of fruits and vegetables daily;Weight Loss: Understanding of general recommendations for a balanced deficit meal plan, which promotes 1-2 lb weight loss per week and includes a negative energy balance of [phone] kcal/d;Understanding recommendations for meals to include 15-35% energy as protein, 25-35%  energy from fat, 35-60% energy from carbohydrates, less than 200mg  of dietary cholesterol, 20-35 gm of total fiber daily;Understanding of distribution of calorie intake throughout the day with the consumption of 4-5 meals/snacks    Heart Failure  Yes    Intervention  Provide a combined exercise and nutrition program that is supplemented with education, support and counseling about heart failure. Directed toward relieving symptoms such as shortness of breath, decreased exercise tolerance, and extremity edema.    Expected Outcomes  Improve functional capacity of life;Short term: Attendance in program 2-3 days a week with increased exercise capacity. Reported lower sodium intake. Reported increased fruit and vegetable intake. Reports medication compliance.;Short term: Daily weights obtained and reported for increase. Utilizing diuretic protocols set by physician.;Long term: Adoption of self-care skills and reduction of barriers for early signs and symptoms recognition and intervention leading to self-care maintenance.    Lipids  Yes    Intervention  Provide education and support for participant on nutrition & aerobic/resistive exercise along with prescribed medications to achieve LDL 70mg , HDL >40mg .    Expected Outcomes  Short Term: Participant states understanding of desired cholesterol values and is compliant with medications prescribed. Participant is following exercise prescription and nutrition guidelines.;Long Term: Cholesterol controlled with medications as prescribed, with individualized exercise RX and with personalized nutrition plan. Value goals: LDL < 70mg , HDL > 40 mg.    Stress  Yes [name] is currently out of work until the end of February. He stated he does not want to go back to work until he is 100% better. His health is his main concern now.     Intervention  Offer individual and/or small group education and counseling on adjustment to heart disease, stress management and health-related  lifestyle change. Teach and support self-help strategies.;Refer participants experiencing significant psychosocial distress to appropriate mental health specialists for further evaluation and treatment. When possible, include family members and significant others in education/counseling sessions.    Expected Outcomes  Short Term: Participant demonstrates changes in health-related behavior, relaxation and other stress management skills, ability to obtain effective social support, and compliance with psychotropic medications if prescribed.;Long Term: Emotional wellbeing is indicated by absence of clinically significant psychosocial distress or social isolation.        Personal Goals Discharge: Goals and Risk Factor Review    Row Name 02/18/18 0841 03/04/18 4132           Core Components/Risk Factors/Patient Goals Review   Personal Goals Review  Weight Management/Obesity;Hypertension;Lipids  Hypertension;Heart Failure;Lipids      Review  [name] is taking meds as directed - BP has been good.  He is seeing his cardiologist today.  He is walking 2 miles at home on days not at [facility].  He is feeling better overall.  He is eating  darker lettuce in his salad.  He is drinking water regularly.    [name] is taking meds as directed.  No new changes      Expected Outcomes  Short - [name] will continue to exercise on his own and follow up with RD about nutrition  Long - [name] will maintain exercise and healthy eating - he will meet with RD   Short - [name] will continue to exercise and take meds as directed  Long - [name] will control CHF and symptoms         Exercise Goals and Review: Exercise Goals    Row Name 01/23/18 1412             Exercise Goals   Increase Physical Activity  Yes       Intervention  Provide advice, education, support and counseling about physical activity/exercise needs.;Develop an individualized exercise prescription for aerobic and resistive training based on initial evaluation findings,  risk stratification, comorbidities and participant's personal goals.       Expected Outcomes  Short Term: Attend rehab on a regular basis to increase amount of physical activity.;Long Term: Add in home exercise to make exercise part of routine and to increase amount of physical activity.;Long Term: Exercising regularly at least 3-5 days a week.       Increase Strength and Stamina  Yes       Intervention  Provide advice, education, support and counseling about physical activity/exercise needs.;Develop an individualized exercise prescription for aerobic and resistive training based on initial evaluation findings, risk stratification, comorbidities and participant's personal goals.       Expected Outcomes  Short Term: Increase workloads from initial exercise prescription for resistance, speed, and METs.;Short Term: Perform resistance training exercises routinely during rehab and add in resistance training at home;Long Term: Improve cardiorespiratory fitness, muscular endurance and strength as measured by increased METs and functional capacity (6MWT)       Able to understand and use rate of perceived exertion (RPE) scale  Yes       Intervention  Provide education and explanation on how to use RPE scale       Expected Outcomes  Short Term: Able to use RPE daily in rehab to express subjective intensity level;Long Term:  Able to use RPE to guide intensity level when exercising independently       Able to understand and use Dyspnea scale  Yes       Intervention  Provide education and explanation on how to use Dyspnea scale       Expected Outcomes  Short Term: Able to use Dyspnea scale daily in rehab to express subjective sense of shortness of breath during exertion;Long Term: Able to use Dyspnea scale to guide intensity level when exercising independently       Knowledge and understanding of Target Heart Rate Range (THRR)  Yes       Intervention  Provide education and explanation of THRR including how the  numbers were predicted and where they are located for reference       Expected Outcomes  Short Term: Able to state/look up THRR;Long Term: Able to use THRR to govern intensity when exercising independently;Short Term: Able to use daily as guideline for intensity in rehab       Able to check pulse independently  Yes       Intervention  Provide education and demonstration on how to check pulse in carotid and radial arteries.;Review the importance of being able to check your own  pulse for safety during independent exercise       Expected Outcomes  Short Term: Able to explain why pulse checking is important during independent exercise;Long Term: Able to check pulse independently and accurately       Understanding of Exercise Prescription  Yes       Intervention  Provide education, explanation, and written materials on patient's individual exercise prescription       Expected Outcomes  Short Term: Able to explain program exercise prescription;Long Term: Able to explain home exercise prescription to exercise independently          Nutrition & Weight - Outcomes: Pre Biometrics - 01/23/18 1411      Pre Biometrics   Height  6' 0.75" (1.848 m)    Weight  236 lb 11.2 oz (107.4 kg)    Waist Circumference  40 inches    Hip Circumference  44.5 inches    Waist to Hip Ratio  0.9 %    BMI (Calculated)  31.44    Single Leg Stand  17.5 seconds        Nutrition: Nutrition Therapy & Goals - 02/13/18 0919      Nutrition Therapy   Diet  DASH    Drug/Food Interactions  Statins/Certain Fruits    Protein (specify units)  10oz    Fiber  35 grams    Saturated Fats  16 max. grams    Fruits and Vegetables  5 servings/day    Sodium  2000 grams      Personal Nutrition Goals   Nutrition Goal  Choose frozen vegetables rather than canned    Personal Goal #2  Aim for 6-8 bottles of water per day; monitor fluid intake vs. sodium intake daily    Personal Goal #3  When you feel up to it, try to cook at home a  couple of times per week rather than buying prepared meals to help reduce dietary sodium intake    Comments  per pt, following a 2000mg /day sodium restriction but estimates current daily sodium intake to be 7-800mg /day      Intervention Plan   Intervention  Prescribe, educate and counsel regarding individualized specific dietary modifications aiming towards targeted core components such as weight, hypertension, lipid management, diabetes, heart failure and other comorbidities.    Expected Outcomes  Short Term Goal: Understand basic principles of dietary content, such as calories, fat, sodium, cholesterol and nutrients.;Short Term Goal: A plan has been developed with personal nutrition goals set during dietitian appointment.;Long Term Goal: Adherence to prescribed nutrition plan.       Nutrition Discharge: Nutrition Assessments - 01/23/18 1141      MEDFICTS Scores   Pre Score  18       Education Questionnaire Score: Knowledge Questionnaire Score - 01/23/18 1141      Knowledge Questionnaire Score   Pre Score  21/28 correct answers reviewed with [name]       Goals reviewed with patient; copy given to patient.

## 2018-04-21 ENCOUNTER — Telehealth (HOSPITAL_COMMUNITY): Payer: Self-pay | Admitting: *Deleted

## 2018-04-21 NOTE — Telephone Encounter (Signed)
LATE ENTRY:  Pt's sister [name] left a VM on line stating she was very frustrated with how our office was handeling her brothers disability claim w/[employer] and that he need this approved in order to get paid.  Called [name] back on Fri 4/19, she states that [employer] told her that pt is only approved for leave through 04/20/18 and that they need more info before they can extend his leave.  She is concerned b/c he does not see Dr Haroldine Laws again until 05/30/18.  She states she called our office on 4/17 trying to get this resolved and did not like the way she was treated and at the end of the call she still was not sure if it was handled or not.  Checking through faxes I did find a new form from [employer] which was faxed to our office on late Thur 4/18, advised I would get this addressed and call her back once done.  The form was completed today, signed by Dr Haroldine Laws and faxed into [facility] at [phone] along with last OV note and 03/26/18 cardiac rehab note as request.  Upon review of pt Dr Bensimhon's last OV note stated for pt to f/u w/echo however this was not scheduled.  appt for echo sch on 5/31 at 2 pm and his appt moved from 5/31 at 1:40 to 3 pm.  Attempted to call pt's sister with this update and left message for her to call me back.

## 2018-04-22 ENCOUNTER — Ambulatory Visit (INDEPENDENT_AMBULATORY_CARE_PROVIDER_SITE_OTHER): Payer: Commercial Managed Care - PPO | Admitting: Internal Medicine

## 2018-04-22 ENCOUNTER — Encounter: Payer: Self-pay | Admitting: Internal Medicine

## 2018-04-22 VITALS — BP 100/60 | HR 90 | Ht 73.5 in | Wt 245.0 lb

## 2018-04-22 DIAGNOSIS — I472 Ventricular tachycardia: Secondary | ICD-10-CM | POA: Diagnosis not present

## 2018-04-22 DIAGNOSIS — I493 Ventricular premature depolarization: Secondary | ICD-10-CM

## 2018-04-22 DIAGNOSIS — I428 Other cardiomyopathies: Secondary | ICD-10-CM | POA: Diagnosis not present

## 2018-04-22 DIAGNOSIS — I5022 Chronic systolic (congestive) heart failure: Secondary | ICD-10-CM

## 2018-04-22 DIAGNOSIS — Z79899 Other long term (current) drug therapy: Secondary | ICD-10-CM

## 2018-04-22 DIAGNOSIS — Z95810 Presence of automatic (implantable) cardiac defibrillator: Secondary | ICD-10-CM | POA: Diagnosis not present

## 2018-04-22 DIAGNOSIS — I4729 Other ventricular tachycardia: Secondary | ICD-10-CM

## 2018-04-22 NOTE — Progress Notes (Signed)
Patient Care Team: [name], MD as PCP - General (Family Medicine) Wellington Hampshire, MD as PCP - Cardiology (Cardiology) [name], FNP as Nurse Practitioner (Family Medicine) Yolonda Kida, MD as Consulting Physician (Cardiology)   HPI  Stephen [name] is a 52 y.o. male Seen with a history of PVCs and a nonischemic cardiomyopathy prior consideration for an ICD.  He also has a history of a TIA and remote syncope.  Initial evaluation 12/18 ventricular ectopy was noted with the plan to use amiodarone for suppression of the ectopy and then reassessment of LV function  Unfortunately he then had a cardiac arrest from which he recovered; underwent ICD implantation 12/18    He is currently on amio and Mex for PVC suppression   , DATE TEST EF   10/18 Echo   25 % Mod LAE MR  12/18  cMRI  16 % Hypokinetic RV midwall gadolinium        Date Cr K TSH LFTs  4/19 1.38 3.9 1.73 (2/19) 13(2/19)          He continues with SB saw Dr DB last month and the question raised again re ablation of PVCs  Tolerating AMIO   Records and Results [facility] records  Past Medical History:  Diagnosis Date  . Arrhythmia   . Cardiac arrest ([place]) 12/23/2017   Brief V-fib arrest  . CHF (congestive heart failure) (HCC)    nonischemic cardiomyopathy, EF 25%  . Hypertension   . NICM (nonischemic cardiomyopathy) ([place])   . TIA (transient ischemic attack) 06/21/16    Past Surgical History:  Procedure Laterality Date  . ICD IMPLANT N/A 12/30/2017   Procedure: ICD IMPLANT;  Surgeon: Deboraha Sprang, MD;  Location: [facility];  Service: Cardiovascular;  Laterality: N/A;  . KNEE SURGERY Right   . LEFT HEART CATH AND CORONARY ANGIOGRAPHY N/A 09/09/2017   Procedure: LEFT HEART CATH AND CORONARY ANGIOGRAPHY;  Surgeon: Teodoro Spray, MD;  Location: [facility];  Service: Cardiovascular;  Laterality: N/A;  . VASECTOMY      Current Outpatient Medications    Medication Sig Dispense Refill  . acetaminophen (TYLENOL) 325 MG tablet Take 2 tablets (650 mg total) by mouth every 6 (six) hours as needed. 60 tablet 0  . albuterol (PROVENTIL HFA;VENTOLIN HFA) 108 (90 Base) MCG/ACT inhaler Inhale 2 puffs into the lungs every 4 (four) hours as needed for wheezing or shortness of breath. 1 Inhaler 0  . amiodarone (PACERONE) 200 MG tablet Take 200 mg by mouth daily.    Marland Kitchen aspirin EC 81 MG EC tablet Take 1 tablet (81 mg total) by mouth daily. 90 tablet 3  . atorvastatin (LIPITOR) 40 MG tablet Take 1 tablet (40 mg total) by mouth daily at 6 PM. 30 tablet 6  . Camphor-Eucalyptus-Menthol (CHEST RUB) 4.8-1.2-2.6 % OINT Apply 1 application topically daily as needed (congestino).     . carvedilol (COREG) 6.25 MG tablet Take 1 tablet (6.25 mg total) by mouth 2 (two) times daily with a meal. 60 tablet 6  . clopidogrel (PLAVIX) 75 MG tablet Take 1 tablet (75 mg total) by mouth daily. 30 tablet 5  . diclofenac sodium (VOLTAREN) 1 % GEL Apply 2 g topically 4 (four) times daily as needed (pain).     Marland Kitchen docusate sodium (COLACE) 100 MG capsule Take 1 capsule (100 mg total) by mouth 2 (two) times daily as needed for mild constipation. 10 capsule  0  . fluticasone (FLONASE) 50 MCG/ACT nasal spray Place 2 sprays into both nostrils daily. (Patient taking differently: Place 2 sprays into both nostrils daily as needed for allergies. ) 16 g 2  . losartan (COZAAR) 25 MG tablet Take 25 mg by mouth 2 (two) times daily.    Marland Kitchen mexiletine (MEXITIL) 150 MG capsule Take 1 capsule (150 mg total) by mouth 2 (two) times daily. 60 capsule 3  . nitroGLYCERIN (NITROSTAT) 0.4 MG SL tablet Place 1 tablet (0.4 mg total) under the tongue every 5 (five) minutes as needed for chest pain. 25 tablet 3  . potassium chloride SA (K-DUR,KLOR-CON) 20 MEQ tablet Take 1 tablet (20 mEq total) by mouth daily. 90 tablet 3  . sacubitril-valsartan (ENTRESTO) 49-51 MG Take 1 tablet by mouth 2 (two) times daily. 60 tablet 3   . spironolactone (ALDACTONE) 25 MG tablet Take 1 tablet (25 mg total) by mouth daily. 30 tablet 5  . Tiotropium Bromide Monohydrate (SPIRIVA RESPIMAT) 2.5 MCG/ACT AERS Inhale 1 Act into the lungs daily.    Marland Kitchen torsemide (DEMADEX) 20 MG tablet Take 2 tablets (40 mg total) by mouth 2 (two) times daily. 120 tablet 3  . traMADol (ULTRAM) 50 MG tablet Take 1 tablet (50 mg total) by mouth every 6 (six) hours as needed. 8 tablet 0   No current facility-administered medications for this visit.     Allergies  Allergen Reactions  . Lisinopril Cough      Review of Systems negative except from HPI and PMH  Physical Exam BP 100/60 (BP Location: Left Arm, Patient Position: Sitting, Cuff Size: Normal)   Pulse 90   Ht 6' 1.5" (1.867 m)   Wt 245 lb (111.1 kg)   BMI 31.89 kg/m  Well developed and nourished in no acute distress HENT normal Neck supple with JVP-flat Clear Irregular rate and rhythm, no murmurs or gallops Abd-soft with active BS No Clubbing cyanosis edema Skin-warm and dry A & Oriented  Grossly normal sensory and motor function  Sinus rhythm with freq PVC RBBB  Sup Axis  and VTNS  Intrinsicoid deflection 100 msec  Assessment and  Plan   Cardiomyopathy-nonischemic  Congestive heart failure-class IIb-III 8  Sleep disordered breathing and daytime somnolence question sleep apnea  PVCs/nonsustained ventricular tachycardia-very frequent ,As above  Tolerating amiodarone and mexiletine.  Unfortunately no major impact on PVC burden.  Agree with Dr. Reine Just that consultation For PVC ablation is appropriate.  I am not optimistic however given the gadolinium enhancement which has been previously been shown to predict lack of improvement following ablation as well as the intrinsicoid deflection being so long.  However, with this young man is a reasonable question to ask.  We will see Dr. Nikki Dom expertise.  Continue his current medications.  No evidence of heart failure today.  We spent  more than 50% of our >25 min visit in face to face counseling regarding the above

## 2018-04-22 NOTE — Telephone Encounter (Signed)
Pt sister [name] left VM on triage phone for a call back. Called [name] made her aware all information was faxed per Heather on 04/21/2018 and pt's new appointment time she is agreeable with no further questions.

## 2018-04-22 NOTE — Patient Instructions (Signed)
Medication Instructions: - Your physician recommends that you continue on your current medications as directed. Please refer to the Current Medication list given to you today.  Labwork: - none ordered  Procedures/Testing: - none ordered  Follow-Up: - You have been referred to : Dr. Thompson Grayer- for consideration of PVC ablation  (Dr. Jackalyn Lombard scheduler, Lenna Sciara, will be in touch with you for an appointment in our [place] office: [address]   Any Additional Special Instructions Will Be Listed Below (If Applicable).     If you need a refill on your cardiac medications before your next appointment, please call your pharmacy.

## 2018-04-29 ENCOUNTER — Other Ambulatory Visit
Admission: RE | Admit: 2018-04-29 | Discharge: 2018-04-29 | Disposition: A | Discharge status: Home / Self Care | Payer: Commercial Managed Care - PPO | Source: Ambulatory Visit | Attending: Orthopedic Surgery | Admitting: Orthopedic Surgery

## 2018-04-29 ENCOUNTER — Telehealth: Payer: Self-pay | Admitting: Cardiovascular Disease

## 2018-04-29 DIAGNOSIS — M109 Gout, unspecified: Secondary | ICD-10-CM | POA: Insufficient documentation

## 2018-04-29 LAB — SYNOVIAL CELL COUNT + DIFF, W/ CRYSTALS
EOSINOPHILS-SYNOVIAL: 0 %
Lymphocytes-Synovial Fld: 0 %
Monocyte-Macrophage-Synovial Fluid: 18 %
NEUTROPHIL, SYNOVIAL: 82 %
Other Cells-SYN: 0
WBC, SYNOVIAL: 42237 /mm3 — AB (ref 0–200)

## 2018-04-29 NOTE — Telephone Encounter (Signed)
Pt sister is calling regarding pt appt with Dr.Allred. Please call.

## 2018-04-30 DIAGNOSIS — M1711 Unilateral primary osteoarthritis, right knee: Secondary | ICD-10-CM | POA: Insufficient documentation

## 2018-04-30 LAB — URIC ACID, BODY FLUID: URIC ACID BODY FLUID: 13 mg/dL

## 2018-05-08 ENCOUNTER — Ambulatory Visit (INDEPENDENT_AMBULATORY_CARE_PROVIDER_SITE_OTHER): Payer: Commercial Managed Care - PPO | Admitting: Cardiovascular Disease

## 2018-05-08 ENCOUNTER — Encounter: Payer: Self-pay | Admitting: Cardiovascular Disease

## 2018-05-08 VITALS — BP 112/80 | HR 88 | Ht 73.5 in | Wt 244.0 lb

## 2018-05-08 DIAGNOSIS — I4729 Other ventricular tachycardia: Secondary | ICD-10-CM

## 2018-05-08 DIAGNOSIS — I10 Essential (primary) hypertension: Secondary | ICD-10-CM

## 2018-05-08 DIAGNOSIS — I472 Ventricular tachycardia: Secondary | ICD-10-CM | POA: Diagnosis not present

## 2018-05-08 DIAGNOSIS — I5022 Chronic systolic (congestive) heart failure: Secondary | ICD-10-CM

## 2018-05-08 MED ORDER — METOLAZONE 2.5 MG PO TABS: 2.5000 mg | ORAL_TABLET | Freq: Every day | ORAL | 0 refills | Status: DC

## 2018-05-08 NOTE — Progress Notes (Signed)
Cardiology Office Note   Date:  05/08/2018   ID:  Stephen, [name] 04-27-1966, MRN [id number]  PCP:  [name], MD  Cardiologist:   Kathlyn Sacramento, MD   Chief Complaint  Patient presents with  . Other    Patient c/o SOB and not eating. He states that his fluid pill does not make him use the restroom and he does not think it is working. Meds reviewed verbally with patient.       History of Present Illness: Stephen [name] is a 52 y.o. male who presents for a follow-up visit regarding chronic systolic heart failure Due to nonischemic cardiomyopathy with frequent PVCs. Previous cardiac catheterization in 2017 showed normal coronary arteries. He had TIA in 2017 and has been on Plavix since then. He quit smoking in 2016. He works at [employer] and cooks for fun.   The patient had significant worsening of cardiac condition in December when he was hospitalized with the flu. He developed cardiogenic shock and ventricular fibrillation arrest. He was transferred to [facility] and was started on milrinone. He had multiple episodes of ventricular tachycardia and ultimately had an ICD placement.cardiac MRI before ICD placement showed an EF of 16%. Since his hospitalization, he has improved gradually with medical therapy but obviously has not been able to return back to work. He was placed on amiodarone and mexiletine to suppress his ventricular ectopy with plans to see Dr. Rayann Heman in the near future to consider ablation. It is presumed that frequent PVCs are significantly contributing to his cardiomyopathy.  He has been doing reasonably well  But reports slight increase in shortness of breath, leg edema and abdominal swelling. He is a few pounds above his dry weight.  Past Medical History:  Diagnosis Date  . Arrhythmia   . Cardiac arrest ([place]) 12/23/2017   Brief V-fib arrest  . CHF (congestive heart failure) (HCC)    nonischemic cardiomyopathy, EF 25%  . Hypertension   . NICM (nonischemic  cardiomyopathy) ([place])   . TIA (transient ischemic attack) 06/21/16    Past Surgical History:  Procedure Laterality Date  . ICD IMPLANT N/A 12/30/2017   Procedure: ICD IMPLANT;  Surgeon: Deboraha Sprang, MD;  Location: [place] [facility];  Service: Cardiovascular;  Laterality: N/A;  . KNEE SURGERY Right   . LEFT HEART CATH AND CORONARY ANGIOGRAPHY N/A 09/09/2017   Procedure: LEFT HEART CATH AND CORONARY ANGIOGRAPHY;  Surgeon: Teodoro Spray, MD;  Location: [place] [facility];  Service: Cardiovascular;  Laterality: N/A;  . VASECTOMY       Current Outpatient Medications  Medication Sig Dispense Refill  . acetaminophen (TYLENOL) 325 MG tablet Take 2 tablets (650 mg total) by mouth every 6 (six) hours as needed. 60 tablet 0  . albuterol (PROVENTIL HFA;VENTOLIN HFA) 108 (90 Base) MCG/ACT inhaler Inhale 2 puffs into the lungs every 4 (four) hours as needed for wheezing or shortness of breath. 1 Inhaler 0  . amiodarone (PACERONE) 200 MG tablet Take 200 mg by mouth daily.    Marland Kitchen aspirin EC 81 MG EC tablet Take 1 tablet (81 mg total) by mouth daily. 90 tablet 3  . atorvastatin (LIPITOR) 40 MG tablet Take 1 tablet (40 mg total) by mouth daily at 6 PM. 30 tablet 6  . Camphor-Eucalyptus-Menthol (CHEST RUB) 4.8-1.2-2.6 % OINT Apply 1 application topically daily as needed (congestino).     . carvedilol (COREG) 6.25 MG tablet Take 1 tablet (6.25 mg total) by mouth 2 (two)  times daily with a meal. 60 tablet 6  . clopidogrel (PLAVIX) 75 MG tablet Take 1 tablet (75 mg total) by mouth daily. 30 tablet 5  . colchicine 0.6 MG tablet Take 0.6 mg by mouth daily.    . diclofenac sodium (VOLTAREN) 1 % GEL Apply 2 g topically 4 (four) times daily as needed (pain).     Marland Kitchen docusate sodium (COLACE) 100 MG capsule Take 1 capsule (100 mg total) by mouth 2 (two) times daily as needed for mild constipation. 10 capsule 0  . fluticasone (FLONASE) 50 MCG/ACT nasal spray Place 2 sprays into both nostrils daily. (Patient taking  differently: Place 2 sprays into both nostrils daily as needed for allergies. ) 16 g 2  . losartan (COZAAR) 25 MG tablet Take 25 mg by mouth 2 (two) times daily.    Marland Kitchen mexiletine (MEXITIL) 150 MG capsule Take 1 capsule (150 mg total) by mouth 2 (two) times daily. 60 capsule 3  . nitroGLYCERIN (NITROSTAT) 0.4 MG SL tablet Place 1 tablet (0.4 mg total) under the tongue every 5 (five) minutes as needed for chest pain. 25 tablet 3  . potassium chloride SA (K-DUR,KLOR-CON) 20 MEQ tablet Take 1 tablet (20 mEq total) by mouth daily. 90 tablet 3  . sacubitril-valsartan (ENTRESTO) 49-51 MG Take 1 tablet by mouth 2 (two) times daily. 60 tablet 3  . spironolactone (ALDACTONE) 25 MG tablet Take 1 tablet (25 mg total) by mouth daily. 30 tablet 5  . Tiotropium Bromide Monohydrate (SPIRIVA RESPIMAT) 2.5 MCG/ACT AERS Inhale 1 Act into the lungs daily.    Marland Kitchen torsemide (DEMADEX) 20 MG tablet Take 2 tablets (40 mg total) by mouth 2 (two) times daily. 120 tablet 3  . traMADol (ULTRAM) 50 MG tablet Take 1 tablet (50 mg total) by mouth every 6 (six) hours as needed. 8 tablet 0   No current facility-administered medications for this visit.     Allergies:   Lisinopril    Social History:  The patient  reports that he quit smoking about 2 years ago. His smoking use included cigarettes. He has a 5.00 pack-year smoking history. He has never used smokeless tobacco. He reports that he drinks alcohol. He reports that he does not use drugs.   Family History:  The patient's family history includes CAD in his father; Heart attack in his father; Heart failure in his mother; Hypertension in his father and mother.    ROS:  Please see the history of present illness.   Otherwise, review of systems are positive for none.   All other systems are reviewed and negative.    PHYSICAL EXAM: VS:  BP 112/80 (BP Location: Left Arm, Patient Position: Sitting, Cuff Size: Normal)   Pulse 88   Ht 6' 1.5" (1.867 m)   Wt 244 lb (110.7 kg)    BMI 31.76 kg/m  , BMI Body mass index is 31.76 kg/m. GEN: Well nourished, well developed, in no acute distress  HEENT: normal  Neck: no  carotid bruits, or masses.  Mild JVD Cardiac: RRR with frequent premature beats; no murmurs, rubs, or gallops, moderate bilateral leg edema  Respiratory:  clear to auscultation bilaterally, normal work of breathing GI: soft, nontender, distended with possible ascites, + BS MS: no deformity or atrophy  Skin: warm and dry, no rash Neuro:  Strength and sensation are intact Psych: euthymic mood, full affect   EKG:  EKG is ordered today. The ekg ordered today demonstrates sinus rhythm with first-degree AV block and frequent  PVCs LVH with repolarization abnormalities.  .Recent Labs: 02/18/2018: TSH 1.730 03/19/2018: ALT 13; B Natriuretic Peptide 1,184.0; Hemoglobin 15.5; Magnesium 2.1; Platelets 226 04/02/2018: BUN 21; Creatinine, Ser 1.38; Potassium 3.9; Sodium 143    Lipid Panel    Component Value Date/Time   CHOL 173 06/22/2016 0755   TRIG 65 06/22/2016 0755   HDL 36 (L) 06/22/2016 0755   CHOLHDL 4.8 06/22/2016 0755   VLDL 13 06/22/2016 0755   LDLCALC 124 (H) 06/22/2016 0755      Wt Readings from Last 3 Encounters:  05/08/18 244 lb (110.7 kg)  04/22/18 245 lb (111.1 kg)  03/19/18 233 lb (105.7 kg)      Other studies Reviewed:   PAD Screen 10/08/2017  Previous PAD dx? No  Previous surgical procedure? No  Pain with walking? Yes  Subsides with rest? Yes  Feet/toe relief with dangling? No  Painful, non-healing ulcers? No  Extremities discolored? No      ASSESSMENT AND PLAN:  1.   chronic systolic heart failure with severely reduced LV systolic function due to nonischemic cardiomyopathy.  Most recent ejection fraction was 16% in spite of optimal medical therapy.   He appears to be mildly volume overloaded and thus I provided him with metolazone 2.5 mg daily to take for only 2 days.  He seems to be taking losartan in addition to  [place]. I discontinued losartan. Continue treatment with carvedilol and spironolactone.Continue close follow-up in the heart failure clinic.   2. Nonsustained ventricular tachycardia and frequent PVCs:  Currently on amiodarone and mexiletine.Keep follow-up with Dr. Rayann Heman to evaluate for ablation.  3. Essential hypertension: Blood pressure is well controlled on current medications.   Disposition:   FU with me in 3 month  Signed,  Kathlyn Sacramento, MD  05/08/2018 11:00 AM    [place]

## 2018-05-08 NOTE — Patient Instructions (Signed)
Medication Instructions: STOP the Losartan START Metolazone 2.5 mg daily for two days only.  INCREASE your potassium to 20 meq twice daily for two days only (while you on the Metolazone)  If you need a refill on your cardiac medications before your next appointment, please call your pharmacy.   Follow-Up: Your physician wants you to follow-up in 3 months with Dr. Fletcher Anon.   Thank you for choosing Heartcare at [facility]!

## 2018-05-12 ENCOUNTER — Encounter: Payer: Self-pay | Admitting: Internal Medicine

## 2018-05-12 ENCOUNTER — Ambulatory Visit (INDEPENDENT_AMBULATORY_CARE_PROVIDER_SITE_OTHER): Payer: Commercial Managed Care - PPO | Admitting: Internal Medicine

## 2018-05-12 VITALS — BP 132/82 | HR 51 | Ht 73.5 in | Wt 236.0 lb

## 2018-05-12 DIAGNOSIS — I493 Ventricular premature depolarization: Secondary | ICD-10-CM

## 2018-05-12 DIAGNOSIS — I428 Other cardiomyopathies: Secondary | ICD-10-CM | POA: Diagnosis not present

## 2018-05-12 DIAGNOSIS — Z95810 Presence of automatic (implantable) cardiac defibrillator: Secondary | ICD-10-CM

## 2018-05-12 DIAGNOSIS — I472 Ventricular tachycardia: Secondary | ICD-10-CM

## 2018-05-12 DIAGNOSIS — I4729 Other ventricular tachycardia: Secondary | ICD-10-CM

## 2018-05-12 NOTE — Progress Notes (Signed)
Electrophysiology Office Note   Date:  05/12/2018   ID:  Stephen [name], Stephen [name] [birth year], MRN [id number]  PCP:  [name], MD  Cardiologist:  Dr Fletcher Anon Primary Electrophysiologist: Dr Caryl Comes  CC: PVCs   History of Present Illness: Stephen [name] is a 52 y.o. male who presents today for electrophysiology evaluation.   He presents today for EP evaluation of PVCs.  He is s/p cardiac arrest for which ICD was implanted 12/18.  He has done reasonably well since.  He is s/p remote syncope and TIA as well. He has been treated with mexiletine and amidoarone for PVCs.  His PVC burden is high.  He is primarily asymptomatic.  There is concern however that his PVCs are the cause of his reduced EF.  He has SOB with moderate activity.    Today, he denies symptoms of palpitations, chest pain, orthopnea, PND, lower extremity edema, claudication, dizziness, presyncope, syncope, bleeding, or neurologic sequela. The patient is tolerating medications without difficulties and is otherwise without complaint today.    Past Medical History:  Diagnosis Date  . Arrhythmia   . Cardiac arrest ([place]) 12/23/2017   Brief V-fib arrest  . CHF (congestive heart failure) (HCC)    nonischemic cardiomyopathy, EF 25%  . Hypertension   . NICM (nonischemic cardiomyopathy) ([place])   . TIA (transient ischemic attack) 06/21/16   Past Surgical History:  Procedure Laterality Date  . ICD IMPLANT N/A 12/30/2017   Procedure: ICD IMPLANT;  Surgeon: Deboraha Sprang, MD;  Location: [place] [facility];  Service: Cardiovascular;  Laterality: N/A;  . KNEE SURGERY Right   . LEFT HEART CATH AND CORONARY ANGIOGRAPHY N/A 09/09/2017   Procedure: LEFT HEART CATH AND CORONARY ANGIOGRAPHY;  Surgeon: Teodoro Spray, MD;  Location: [place] [facility];  Service: Cardiovascular;  Laterality: N/A;  . VASECTOMY       Current Outpatient Medications  Medication Sig Dispense Refill  . acetaminophen (TYLENOL) 325 MG tablet Take 2  tablets (650 mg total) by mouth every 6 (six) hours as needed. 60 tablet 0  . albuterol (PROVENTIL HFA;VENTOLIN HFA) 108 (90 Base) MCG/ACT inhaler Inhale 2 puffs into the lungs every 4 (four) hours as needed for wheezing or shortness of breath. 1 Inhaler 0  . amiodarone (PACERONE) 200 MG tablet Take 200 mg by mouth daily.    Marland Kitchen aspirin EC 81 MG EC tablet Take 1 tablet (81 mg total) by mouth daily. 90 tablet 3  . atorvastatin (LIPITOR) 40 MG tablet Take 1 tablet (40 mg total) by mouth daily at 6 PM. 30 tablet 6  . Camphor-Eucalyptus-Menthol (CHEST RUB) 4.8-1.2-2.6 % OINT Apply 1 application topically daily as needed (congestino).     . carvedilol (COREG) 6.25 MG tablet Take 1 tablet (6.25 mg total) by mouth 2 (two) times daily with a meal. 60 tablet 6  . clopidogrel (PLAVIX) 75 MG tablet Take 1 tablet (75 mg total) by mouth daily. 30 tablet 5  . colchicine 0.6 MG tablet Take 0.6 mg by mouth daily.    . diclofenac sodium (VOLTAREN) 1 % GEL Apply 2 g topically 4 (four) times daily as needed (pain).     Marland Kitchen docusate sodium (COLACE) 100 MG capsule Take 1 capsule (100 mg total) by mouth 2 (two) times daily as needed for mild constipation. 10 capsule 0  . fluticasone (FLONASE) 50 MCG/ACT nasal spray Place 2 sprays into both nostrils daily. (Patient taking differently: Place 2 sprays into both nostrils daily as needed for  allergies. ) 16 g 2  . mexiletine (MEXITIL) 150 MG capsule Take 1 capsule (150 mg total) by mouth 2 (two) times daily. 60 capsule 3  . nitroGLYCERIN (NITROSTAT) 0.4 MG SL tablet Place 1 tablet (0.4 mg total) under the tongue every 5 (five) minutes as needed for chest pain. 25 tablet 3  . potassium chloride SA (K-DUR,KLOR-CON) 20 MEQ tablet Take 1 tablet (20 mEq total) by mouth daily. 90 tablet 3  . sacubitril-valsartan (ENTRESTO) 49-51 MG Take 1 tablet by mouth 2 (two) times daily. 60 tablet 3  . spironolactone (ALDACTONE) 25 MG tablet Take 1 tablet (25 mg total) by mouth daily. 30 tablet 5    . Tiotropium Bromide Monohydrate (SPIRIVA RESPIMAT) 2.5 MCG/ACT AERS Inhale 1 Act into the lungs daily.    Marland Kitchen torsemide (DEMADEX) 20 MG tablet Take 2 tablets (40 mg total) by mouth 2 (two) times daily. 120 tablet 3  . traMADol (ULTRAM) 50 MG tablet Take 1 tablet (50 mg total) by mouth every 6 (six) hours as needed. 8 tablet 0   No current facility-administered medications for this visit.     Allergies:   Lisinopril   Social History:  The patient  reports that he quit smoking about 2 years ago. His smoking use included cigarettes. He has a 5.00 pack-year smoking history. He has never used smokeless tobacco. He reports that he drinks alcohol. He reports that he does not use drugs.   Family History:  The patient's  family history includes CAD in his father; Heart attack in his father; Heart failure in his mother; Hypertension in his father and mother.    ROS:  Please see the history of present illness.   All other systems are personally reviewed and negative.    PHYSICAL EXAM: VS:  BP 132/82   Pulse (!) 51   Ht 6' 1.5" (1.867 m)   Wt 236 lb (107 kg)   SpO2 98%   BMI 30.71 kg/m  , BMI Body mass index is 30.71 kg/m. GEN: Well nourished, well developed, in no acute distress  HEENT: normal  Neck: no JVD, carotid bruits, or masses Cardiac: RRR with ectopy; no murmurs, rubs, or gallops,no edema  Respiratory:  clear to auscultation bilaterally, normal work of breathing GI: soft, nontender, nondistended, + BS MS: no deformity or atrophy  Skin: warm and dry  Neuro:  Strength and sensation are intact Psych: euthymic mood, full affect  EKG:  EKG 04/22/18 is reviewed and reveals RBB, superior axis,  The MDI is long and thus suggests possible epicardial focus.   Recent Labs: 02/18/2018: TSH 1.730 03/19/2018: ALT 13; B Natriuretic Peptide 1,184.0; Hemoglobin 15.5; Magnesium 2.1; Platelets 226 04/02/2018: BUN 21; Creatinine, Ser 1.38; Potassium 3.9; Sodium 143  personally reviewed   Lipid  Panel     Component Value Date/Time   CHOL 173 06/22/2016 0755   TRIG 65 06/22/2016 0755   HDL 36 (L) 06/22/2016 0755   CHOLHDL 4.8 06/22/2016 0755   VLDL 13 06/22/2016 0755   LDLCALC 124 (H) 06/22/2016 0755   personally reviewed   Wt Readings from Last 3 Encounters:  05/12/18 236 lb (107 kg)  05/08/18 244 lb (110.7 kg)  04/22/18 245 lb (111.1 kg)      Other studies personally reviewed: Additional studies/ records that were reviewed today include: Dr Caryl Comes and Dr Tyrell Antonio notes, prior echo, CHF clinic notes  Review of the above records today demonstrates: as above   ASSESSMENT AND PLAN:  1.  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  The MDI is prolonged and therefore may represent an epicardial source. Therapeutic strategies for ventricular tachycardia and nonsustained PVCs including medicine and ablation were discussed in detail with the patient today. Risk, benefits, and alternatives to EP study and radiofrequency ablation were also discussed in detail today. These risks include but are not limited to stroke, bleeding, vascular damage, tamponade, perforation, damage to the heart and other structures, AV block,  ICD lead dislodgement,  worsening renal function, and death. The patient understands these risk and wishes to proceed.  We will therefore proceed with catheter ablation at the next available time. He is instructed to hold amiodarone for 2 weeks and coreg/mexitil for 2 days prior to the procedure.  2. Chronic systolic dysfunction (nonischemic) Stable No change required today    Current medicines are reviewed at length with the patient today.   The patient does not have concerns regarding his medicines.  The following changes were made today:  none   Signed, Thompson Grayer, MD  05/12/2018 4:09 PM     [facility] [address] [place] [address] [phone] (office) [phone] (fax)

## 2018-05-12 NOTE — H&P (View-Only) (Signed)
Electrophysiology Office Note   Date:  05/12/2018   ID:  [name], Stephen 1966-02-26, MRN [id number]  PCP:  [name], MD  Cardiologist:  Dr Fletcher Anon Primary Electrophysiologist: Dr Caryl Comes  CC: PVCs   History of Present Illness: Stephen [name] is a 52 y.o. male who presents today for electrophysiology evaluation.   He presents today for EP evaluation of PVCs.  He is s/p cardiac arrest for which ICD was implanted 12/18.  He has done reasonably well since.  He is s/p remote syncope and TIA as well. He has been treated with mexiletine and amidoarone for PVCs.  His PVC burden is high.  He is primarily asymptomatic.  There is concern however that his PVCs are the cause of his reduced EF.  He has SOB with moderate activity.    Today, he denies symptoms of palpitations, chest pain, orthopnea, PND, lower extremity edema, claudication, dizziness, presyncope, syncope, bleeding, or neurologic sequela. The patient is tolerating medications without difficulties and is otherwise without complaint today.    Past Medical History:  Diagnosis Date  . Arrhythmia   . Cardiac arrest ([place]) 12/23/2017   Brief V-fib arrest  . CHF (congestive heart failure) ([facility])    nonischemic cardiomyopathy, EF 25%  . Hypertension   . NICM (nonischemic cardiomyopathy) ([place])   . TIA (transient ischemic attack) 06/21/16   Past Surgical History:  Procedure Laterality Date  . ICD IMPLANT N/A 12/30/2017   Procedure: ICD IMPLANT;  Surgeon: Deboraha Sprang, MD;  Location: [facility];  Service: Cardiovascular;  Laterality: N/A;  . KNEE SURGERY Right   . LEFT HEART CATH AND CORONARY ANGIOGRAPHY N/A 09/09/2017   Procedure: LEFT HEART CATH AND CORONARY ANGIOGRAPHY;  Surgeon: Teodoro Spray, MD;  Location: [facility];  Service: Cardiovascular;  Laterality: N/A;  . VASECTOMY       Current Outpatient Medications  Medication Sig Dispense Refill  . acetaminophen (TYLENOL) 325 MG tablet Take 2  tablets (650 mg total) by mouth every 6 (six) hours as needed. 60 tablet 0  . albuterol (PROVENTIL HFA;VENTOLIN HFA) 108 (90 Base) MCG/ACT inhaler Inhale 2 puffs into the lungs every 4 (four) hours as needed for wheezing or shortness of breath. 1 Inhaler 0  . amiodarone (PACERONE) 200 MG tablet Take 200 mg by mouth daily.    Marland Kitchen aspirin EC 81 MG EC tablet Take 1 tablet (81 mg total) by mouth daily. 90 tablet 3  . atorvastatin (LIPITOR) 40 MG tablet Take 1 tablet (40 mg total) by mouth daily at 6 PM. 30 tablet 6  . Camphor-Eucalyptus-Menthol (CHEST RUB) 4.8-1.2-2.6 % OINT Apply 1 application topically daily as needed (congestino).     . carvedilol (COREG) 6.25 MG tablet Take 1 tablet (6.25 mg total) by mouth 2 (two) times daily with a meal. 60 tablet 6  . clopidogrel (PLAVIX) 75 MG tablet Take 1 tablet (75 mg total) by mouth daily. 30 tablet 5  . colchicine 0.6 MG tablet Take 0.6 mg by mouth daily.    . diclofenac sodium (VOLTAREN) 1 % GEL Apply 2 g topically 4 (four) times daily as needed (pain).     Marland Kitchen docusate sodium (COLACE) 100 MG capsule Take 1 capsule (100 mg total) by mouth 2 (two) times daily as needed for mild constipation. 10 capsule 0  . fluticasone (FLONASE) 50 MCG/ACT nasal spray Place 2 sprays into both nostrils daily. (Patient taking differently: Place 2 sprays into both nostrils daily as needed for  allergies. ) 16 g 2  . mexiletine (MEXITIL) 150 MG capsule Take 1 capsule (150 mg total) by mouth 2 (two) times daily. 60 capsule 3  . nitroGLYCERIN (NITROSTAT) 0.4 MG SL tablet Place 1 tablet (0.4 mg total) under the tongue every 5 (five) minutes as needed for chest pain. 25 tablet 3  . potassium chloride SA (K-DUR,KLOR-CON) 20 MEQ tablet Take 1 tablet (20 mEq total) by mouth daily. 90 tablet 3  . sacubitril-valsartan (ENTRESTO) 49-51 MG Take 1 tablet by mouth 2 (two) times daily. 60 tablet 3  . spironolactone (ALDACTONE) 25 MG tablet Take 1 tablet (25 mg total) by mouth daily. 30 tablet 5    . Tiotropium Bromide Monohydrate (SPIRIVA RESPIMAT) 2.5 MCG/ACT AERS Inhale 1 Act into the lungs daily.    Marland Kitchen torsemide (DEMADEX) 20 MG tablet Take 2 tablets (40 mg total) by mouth 2 (two) times daily. 120 tablet 3  . traMADol (ULTRAM) 50 MG tablet Take 1 tablet (50 mg total) by mouth every 6 (six) hours as needed. 8 tablet 0   No current facility-administered medications for this visit.     Allergies:   Lisinopril   Social History:  The patient  reports that he quit smoking about 2 years ago. His smoking use included cigarettes. He has a 5.00 pack-year smoking history. He has never used smokeless tobacco. He reports that he drinks alcohol. He reports that he does not use drugs.   Family History:  The patient's  family history includes CAD in his father; Heart attack in his father; Heart failure in his mother; Hypertension in his father and mother.    ROS:  Please see the history of present illness.   All other systems are personally reviewed and negative.    PHYSICAL EXAM: VS:  BP 132/82   Pulse (!) 51   Ht 6' 1.5" (1.867 m)   Wt 236 lb (107 kg)   SpO2 98%   BMI 30.71 kg/m  , BMI Body mass index is 30.71 kg/m. GEN: Well nourished, well developed, in no acute distress  HEENT: normal  Neck: no JVD, carotid bruits, or masses Cardiac: RRR with ectopy; no murmurs, rubs, or gallops,no edema  Respiratory:  clear to auscultation bilaterally, normal work of breathing GI: soft, nontender, nondistended, + BS MS: no deformity or atrophy  Skin: warm and dry  Neuro:  Strength and sensation are intact Psych: euthymic mood, full affect  EKG:  EKG 04/22/18 is reviewed and reveals RBB, superior axis,  The MDI is long and thus suggests possible epicardial focus.   Recent Labs: 02/18/2018: TSH 1.730 03/19/2018: ALT 13; B Natriuretic Peptide 1,184.0; Hemoglobin 15.5; Magnesium 2.1; Platelets 226 04/02/2018: BUN 21; Creatinine, Ser 1.38; Potassium 3.9; Sodium 143  personally reviewed   Lipid  Panel     Component Value Date/Time   CHOL 173 06/22/2016 0755   TRIG 65 06/22/2016 0755   HDL 36 (L) 06/22/2016 0755   CHOLHDL 4.8 06/22/2016 0755   VLDL 13 06/22/2016 0755   LDLCALC 124 (H) 06/22/2016 0755   personally reviewed   Wt Readings from Last 3 Encounters:  05/12/18 236 lb (107 kg)  05/08/18 244 lb (110.7 kg)  04/22/18 245 lb (111.1 kg)      Other studies personally reviewed: Additional studies/ records that were reviewed today include: Dr Caryl Comes and Dr Tyrell Antonio notes, prior echo, CHF clinic notes  Review of the above records today demonstrates: as above   ASSESSMENT AND PLAN:  1.  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  The MDI is prolonged and therefore may represent an epicardial source. Therapeutic strategies for ventricular tachycardia and nonsustained PVCs including medicine and ablation were discussed in detail with the patient today. Risk, benefits, and alternatives to EP study and radiofrequency ablation were also discussed in detail today. These risks include but are not limited to stroke, bleeding, vascular damage, tamponade, perforation, damage to the heart and other structures, AV block,  ICD lead dislodgement,  worsening renal function, and death. The patient understands these risk and wishes to proceed.  We will therefore proceed with catheter ablation at the next available time. He is instructed to hold amiodarone for 2 weeks and coreg/mexitil for 2 days prior to the procedure.  2. Chronic systolic dysfunction (nonischemic) Stable No change required today    Current medicines are reviewed at length with the patient today.   The patient does not have concerns regarding his medicines.  The following changes were made today:  none   Signed, Thompson Grayer, MD  05/12/2018 4:09 PM     [facility] [address] [place] [address] [phone] (office) [phone] (fax)

## 2018-05-12 NOTE — Patient Instructions (Addendum)
Medication Instructions:  Your physician recommends that you continue on your current medications as directed. Please refer to the Current Medication list given to you today.  Labwork: You will get lab work the last week of May (any day) in [place].  CBC and BMET.  Testing/Procedures: Your physician has recommended that you have an ablation. Catheter ablation is a medical procedure used to treat some cardiac arrhythmias (irregular heartbeats). During catheter ablation, a long, thin, flexible tube is put into a blood vessel in your groin (upper thigh), or neck. This tube is called an ablation catheter. It is then guided to your heart through the blood vessel. Radio frequency waves destroy small areas of heart tissue where abnormal heartbeats may cause an arrhythmia to start. Please see the instruction sheet given to you today.  Follow-Up: Your physician wants you to follow-up in: 4 weeks with Dr. Rayann Heman.    Any Other Special Instructions Will Be Listed Below (If Applicable).  Please arrive at the [facility] main entrance of [place] hospital at:   5:30 am on June 03, 2018  Do not eat or drink after midnight prior to procedure  Stop taking your amiodarone on May 20, 2018 (2 weeks prior to ablation) Stop taking your Mexitil and Coreg on May 31, 2018  (2 days prior to ablation) Do not take any medications the morning of the procedure  Plan for one night stay  You will need someone to drive you home at discharge  If you need a refill on your cardiac medications before your next appointment, please call your pharmacy.    Cardiac Ablation Cardiac ablation is a procedure to disable (ablate) a small amount of heart tissue in very specific places. The heart has many electrical connections. Sometimes these connections are abnormal and can cause the heart to beat very fast or irregularly. Ablating some of the problem areas can improve the heart rhythm or return it to normal. Ablation may be  done for people who:  Have Wolff-Parkinson-White syndrome.  Have fast heart rhythms (tachycardia).  Have taken medicines for an abnormal heart rhythm (arrhythmia) that were not effective or caused side effects.  Have a high-risk heartbeat that may be life-threatening.  During the procedure, a small incision is made in the neck or the groin, and a long, thin, flexible tube (catheter) is inserted into the incision and moved to the heart. Small devices (electrodes) on the tip of the catheter will send out electrical currents. A type of X-ray (fluoroscopy) will be used to help guide the catheter and to provide images of the heart. Tell a health care provider about:  Any allergies you have.  All medicines you are taking, including vitamins, herbs, eye drops, creams, and over-the-counter medicines.  Any problems you or family members have had with anesthetic medicines.  Any blood disorders you have.  Any surgeries you have had.  Any medical conditions you have, such as kidney failure.  Whether you are pregnant or may be pregnant. What are the risks? Generally, this is a safe procedure. However, problems may occur, including:  Infection.  Bruising and bleeding at the catheter insertion site.  Bleeding into the chest, especially into the sac that surrounds the heart. This is a serious complication.  Stroke or blood clots.  Damage to other structures or organs.  Allergic reaction to medicines or dyes.  Need for a permanent pacemaker if the normal electrical system is damaged. A pacemaker is a small computer that sends electrical signals to  the heart and helps your heart beat normally.  The procedure not being fully effective. This may not be recognized until months later. Repeat ablation procedures are sometimes required.  What happens before the procedure?  Follow instructions from your health care provider about eating or drinking restrictions.  Ask your health care provider  about: ? Changing or stopping your regular medicines. This is especially important if you are taking diabetes medicines or blood thinners. ? Taking medicines such as aspirin and ibuprofen. These medicines can thin your blood. Do not take these medicines before your procedure if your health care provider instructs you not to.  Plan to have someone take you home from the hospital or clinic.  If you will be going home right after the procedure, plan to have someone with you for 24 hours. What happens during the procedure?  To lower your risk of infection: ? Your health care team will wash or sanitize their hands. ? Your skin will be washed with soap. ? Hair may be removed from the incision area.  An IV tube will be inserted into one of your veins.  You will be given a medicine to help you relax (sedative).  The skin on your neck or groin will be numbed.  An incision will be made in your neck or your groin.  A needle will be inserted through the incision and into a large vein in your neck or groin.  A catheter will be inserted into the needle and moved to your heart.  Dye may be injected through the catheter to help your surgeon see the area of the heart that needs treatment.  Electrical currents will be sent from the catheter to ablate heart tissue in desired areas. There are three types of energy that may be used to ablate heart tissue: ? Heat (radiofrequency energy). ? Laser energy. ? Extreme cold (cryoablation).  When the necessary tissue has been ablated, the catheter will be removed.  Pressure will be held on the catheter insertion area to prevent excessive bleeding.  A bandage (dressing) will be placed over the catheter insertion area. The procedure may vary among health care providers and hospitals. What happens after the procedure?  Your blood pressure, heart rate, breathing rate, and blood oxygen level will be monitored until the medicines you were given have worn  off.  Your catheter insertion area will be monitored for bleeding. You will need to lie still for a few hours to ensure that you do not bleed from the catheter insertion area.  Do not drive for 24 hours or as long as directed by your health care provider. Summary  Cardiac ablation is a procedure to disable (ablate) a small amount of heart tissue in very specific places. Ablating some of the problem areas can improve the heart rhythm or return it to normal.  During the procedure, electrical currents will be sent from the catheter to ablate heart tissue in desired areas. This information is not intended to replace advice given to you by your health care provider. Make sure you discuss any questions you have with your health care provider. Document Released: 05/05/2009 Document Revised: 11/05/2016 Document Reviewed: 11/05/2016 Elsevier Interactive Patient Education  [employer].

## 2018-05-13 ENCOUNTER — Telehealth: Payer: Self-pay | Admitting: Internal Medicine

## 2018-05-13 ENCOUNTER — Encounter: Payer: Self-pay | Admitting: Family Medicine

## 2018-05-13 ENCOUNTER — Ambulatory Visit (INDEPENDENT_AMBULATORY_CARE_PROVIDER_SITE_OTHER): Payer: Commercial Managed Care - PPO | Admitting: Family Medicine

## 2018-05-13 VITALS — BP 130/72 | HR 73 | Wt 235.5 lb

## 2018-05-13 DIAGNOSIS — I493 Ventricular premature depolarization: Secondary | ICD-10-CM | POA: Diagnosis not present

## 2018-05-13 DIAGNOSIS — E785 Hyperlipidemia, unspecified: Secondary | ICD-10-CM | POA: Insufficient documentation

## 2018-05-13 DIAGNOSIS — K625 Hemorrhage of anus and rectum: Secondary | ICD-10-CM | POA: Diagnosis not present

## 2018-05-13 DIAGNOSIS — I5023 Acute on chronic systolic (congestive) heart failure: Secondary | ICD-10-CM | POA: Diagnosis not present

## 2018-05-13 DIAGNOSIS — E782 Mixed hyperlipidemia: Secondary | ICD-10-CM

## 2018-05-13 DIAGNOSIS — N183 Chronic kidney disease, stage 3 unspecified: Secondary | ICD-10-CM | POA: Insufficient documentation

## 2018-05-13 DIAGNOSIS — M1A9XX0 Chronic gout, unspecified, without tophus (tophi): Secondary | ICD-10-CM

## 2018-05-13 DIAGNOSIS — Z23 Encounter for immunization: Secondary | ICD-10-CM | POA: Diagnosis not present

## 2018-05-13 DIAGNOSIS — I129 Hypertensive chronic kidney disease with stage 1 through stage 4 chronic kidney disease, or unspecified chronic kidney disease: Secondary | ICD-10-CM

## 2018-05-13 DIAGNOSIS — N529 Male erectile dysfunction, unspecified: Secondary | ICD-10-CM

## 2018-05-13 LAB — CBC WITH DIFFERENTIAL/PLATELET
HEMOGLOBIN: 17.8 g/dL — AB (ref 13.0–17.7)
Hematocrit: 52.6 % — ABNORMAL HIGH (ref 37.5–51.0)
Lymphocytes Absolute: 3 10*3/uL (ref 0.7–3.1)
Lymphs: 29 %
MCH: 30.8 pg (ref 26.6–33.0)
MCHC: 33.8 g/dL (ref 31.5–35.7)
MCV: 91 fL (ref 79–97)
MID (Absolute): 1.4 10*3/uL (ref 0.1–1.6)
MID: 14 %
NEUTROS ABS: 5.9 10*3/uL (ref 1.4–7.0)
NEUTROS PCT: 57 %
Platelets: 367 10*3/uL (ref 150–379)
RBC: 5.78 x10E6/uL (ref 4.14–5.80)
RDW: 15.1 % (ref 12.3–15.4)
WBC: 10.3 10*3/uL (ref 3.4–10.8)

## 2018-05-13 LAB — IFOBT (OCCULT BLOOD): IMMUNOLOGICAL FECAL OCCULT BLOOD TEST: POSITIVE

## 2018-05-13 NOTE — Assessment & Plan Note (Signed)
Rechecking levels today. Await results. Continue to monitor.  

## 2018-05-13 NOTE — Assessment & Plan Note (Signed)
Stable on atorvastatin. Rechecking levels today. Await results. Continue to monitor.

## 2018-05-13 NOTE — Telephone Encounter (Signed)
Returned call to sister.  Request received to reschedule procedure to week later.  Rescheduled Pt for VT ablation on June 10, 2018 at 7:30 am.  Still ok to get labs 05/27/2018.  Will correct letter on MyChart for sister to print.   No further action needed at this time.

## 2018-05-13 NOTE — Progress Notes (Signed)
BP 130/72   Pulse 73   Wt 235 lb 8 oz (106.8 kg)   SpO2 98%   BMI 30.65 kg/m    Subjective:    Patient ID: Stephen [name], male    DOB: 11-02-1966, 52 y.o.   MRN: [id number]  HPI: Stephen [name] is a 52 y.o. male who presents today to establish care.   Chief Complaint  Patient presents with  . Establish Care  . Rectal Bleeding    Sunday/Monday, Dark. None today. Also happened once last month.    Known CHF with EF of <20% on ECHO. Follows with Dr. Fletcher Anon. Has been having issues with fluid overload. Has not been tolerating his furosemide- has not been making him urinate. At his appointment with Dr. Fletcher Anon 5 days ago, he was changed from furosemide to torsemide and given 2 days of metolazone- has since come off of it. Doing well on the torsemide. Has lost 8lbs in the last 5 days. Swelling in doing better. He notes that his swelling is doing better since then.  Has frequent PVCs, Sae EP yesterday (Dr. Rayann Heman) Per Dr. Rayann Heman: "He is s/p cardiac arrest for which ICD was implanted 12/18.  He has done reasonably well since.  He is s/p remote syncope and TIA as well. He has been treated with mexiletine and amidoarone for PVCs.  His PVC burden is high.  He is primarily asymptomatic.  There is concern however that his PVCs are the cause of his reduced EF. He is s/p cardiac arrest for which ICD was implanted 12/18.  He has done reasonably well since.  He is s/p remote syncope and TIA as well. He has been treated with mexiletine and amidoarone for PVCs.  His PVC burden is high.  He is primarily asymptomatic.  There is concern however that his PVCs are the cause of his reduced EF." He is going to have a cardiac catheter ablation in the 2nd week of June.  CHRONIC KIDNEY DISEASE CKD status: stable Medications renally dose: yes Previous renal evaluation: no Pneumovax:  Given today Influenza Vaccine:  Up to Date  HYPERTENSION / HYPERLIPIDEMIA Satisfied with current treatment? yes Duration of  hypertension: chronic BP monitoring frequency: a few times a month BP medication side effects: no Duration of hyperlipidemia: chronic Cholesterol medication side effects: no Cholesterol supplements: none Past cholesterol medications: atorvastain (lipitor) Medication compliance: excellent compliance Aspirin: yes Recent stressors: yes Recurrent headaches: no Visual changes: no Palpitations: no Dyspnea: no Chest pain: no Lower extremity edema: yes Dizzy/lightheaded: no  RECTAL BLEEDING- has a history of GI bleed- "busted blood vessel in his stomach" in the 1990s, had colonoscopy and EGD at that time  Duration: About a month, worse again 1-2 days ago Bright red rectal bleeding: no  Amount of blood: moderate  Frequency: 2x in the last month, not with every BM Melena: yes  Spotting on toilet tissue: yes  Anal fullness: no  Perianal pain: no  Severity: moderate Perianal irritation/itching: no  Constipation: yes  Chronic straining/valsava:  no  Anal trauma/intercourse: no  Hemorrhoids: no  Previous colonoscopy: in the 1990s- referred in 2017, but then had MI   GOUT Duration:chronic Right 1st metatarsophalangeal pain: no Left 1st metatarsophalangeal pain: no Right knee pain: no Left knee pain: no Severity: mild  Swelling: no Redness: no Trauma: no Recent dietary change or indiscretion: no Fevers: no Nausea/vomiting: no Status:  stable  Active Ambulatory Problems    Diagnosis Date Noted  . Acute on chronic systolic  CHF (congestive heart failure) ([place]) 06/04/2016  . TIA (transient ischemic attack) 06/22/2016  . Benign hypertensive renal disease 06/22/2016  . Chronic systolic CHF (congestive heart failure) ([place]) 05/20/2017  . COPD (chronic obstructive pulmonary disease) ([place]) 07/04/2017  . Ventricular tachycardia ([place]) 07/29/2017  . Bradycardia 08/30/2017  . History of non-ST elevation myocardial infarction (NSTEMI) 09/08/2017  . Cardiogenic shock ([place]) 12/24/2017  .  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 NICM (nonischemic cardiomyopathy) ([place])   . CKD (chronic kidney disease) stage 3, GFR 30-59 ml/min (HCC) 05/13/2018  . Hyperlipidemia 05/13/2018  . Gout 05/13/2018  . ED (erectile dysfunction) 05/13/2018   Resolved Ambulatory Problems    Diagnosis Date Noted  . Chest pain 09/08/2017  . Pneumonia 12/20/2017  . AKI (acute kidney injury) ([place]) 12/24/2017  . Influenza A 12/24/2017  . Cardiac arrest ([place])   . Heart failure ([place]) 06/04/2016   Past Medical History:  Diagnosis Date  . AKI (acute kidney injury) ([place]) 12/24/2017  . Arrhythmia   . Cardiac arrest ([place]) 12/23/2017  . Cardiac arrest ([place])   . Chest pain 09/08/2017  . CHF (congestive heart failure) ([place])   . Hypertension   . Influenza A 12/24/2017  . NICM (nonischemic cardiomyopathy) ([place])   . Pneumonia 12/20/2017  . TIA (transient ischemic attack) 06/21/16   Past Surgical History:  Procedure Laterality Date  . ICD IMPLANT N/A 12/30/2017   Procedure: ICD IMPLANT;  Surgeon: Deboraha Sprang, MD;  Location: [facility];  Service: Cardiovascular;  Laterality: N/A;  . KNEE SURGERY Right   . LEFT HEART CATH AND CORONARY ANGIOGRAPHY N/A 09/09/2017   Procedure: LEFT HEART CATH AND CORONARY ANGIOGRAPHY;  Surgeon: Teodoro Spray, MD;  Location: [facility];  Service: Cardiovascular;  Laterality: N/A;  . VASECTOMY     Outpatient Encounter Medications as of 05/13/2018  Medication Sig  . acetaminophen (TYLENOL) 325 MG tablet Take 2 tablets (650 mg total) by mouth every 6 (six) hours as needed. (Patient taking differently: Take 500 mg by mouth every 6 (six) hours as needed. )  . amiodarone (PACERONE) 200 MG tablet Take 400 mg by mouth daily.   Marland Kitchen aspirin EC 81 MG EC tablet Take 1 tablet (81 mg total) by mouth daily.  Marland Kitchen atorvastatin (LIPITOR) 40 MG tablet Take 1 tablet (40 mg total) by mouth daily at 6 PM.  . Camphor-Eucalyptus-Menthol (CHEST RUB) 4.8-1.2-2.6 % OINT Apply 1 application topically  daily as needed (congestino).   . carvedilol (COREG) 6.25 MG tablet Take 1 tablet (6.25 mg total) by mouth 2 (two) times daily with a meal.  . clopidogrel (PLAVIX) 75 MG tablet Take 1 tablet (75 mg total) by mouth daily.  . colchicine 0.6 MG tablet Take 0.6 mg by mouth daily.  . diclofenac sodium (VOLTAREN) 1 % GEL Apply 2 g topically 4 (four) times daily as needed (pain).   Marland Kitchen docusate sodium (COLACE) 100 MG capsule Take 1 capsule (100 mg total) by mouth 2 (two) times daily as needed for mild constipation.  . fluticasone (FLONASE) 50 MCG/ACT nasal spray Place 2 sprays into both nostrils daily. (Patient taking differently: Place 2 sprays into both nostrils daily as needed for allergies. )  . mexiletine (MEXITIL) 150 MG capsule Take 1 capsule (150 mg total) by mouth 2 (two) times daily.  . nitroGLYCERIN (NITROSTAT) 0.4 MG SL tablet Place 1 tablet (0.4 mg total) under the tongue every 5 (five) minutes as needed for chest pain.  . potassium chloride SA (K-DUR,KLOR-CON) 20  MEQ tablet Take 1 tablet (20 mEq total) by mouth daily. (Patient taking differently: Take 20 mEq by mouth 2 (two) times daily. )  . sacubitril-valsartan (ENTRESTO) 49-51 MG Take 1 tablet by mouth 2 (two) times daily.  Marland Kitchen spironolactone (ALDACTONE) 25 MG tablet Take 1 tablet (25 mg total) by mouth daily.  . Tiotropium Bromide Monohydrate (SPIRIVA RESPIMAT) 2.5 MCG/ACT AERS Inhale 1 Act into the lungs daily.  Marland Kitchen torsemide (DEMADEX) 20 MG tablet Take 2 tablets (40 mg total) by mouth 2 (two) times daily.  . traMADol (ULTRAM) 50 MG tablet Take 1 tablet (50 mg total) by mouth every 6 (six) hours as needed.  Marland Kitchen albuterol (PROVENTIL HFA;VENTOLIN HFA) 108 (90 Base) MCG/ACT inhaler Inhale 2 puffs into the lungs every 4 (four) hours as needed for wheezing or shortness of breath.   No facility-administered encounter medications on file as of 05/13/2018.    Allergies  Allergen Reactions  . Lisinopril Cough   Social History   Socioeconomic  History  . Marital status: Single    Spouse name: Not on file  . Number of children: Not on file  . Years of education: Not on file  . Highest education level: Not on file  Occupational History  . Not on file  Social Needs  . Financial resource strain: Not on file  . Food insecurity:    Worry: Not on file    Inability: Not on file  . Transportation needs:    Medical: Not on file    Non-medical: Not on file  Tobacco Use  . Smoking status: Former Smoker    Packs/day: 0.25    Years: 20.00    Pack years: 5.00    Types: Cigarettes    Last attempt to quit: 02/20/2016    Years since quitting: 2.2  . Smokeless tobacco: Never Used  Substance and Sexual Activity  . Alcohol use: Yes    Alcohol/week: 0.0 oz    Comment: beer occasional  . Drug use: No  . Sexual activity: Not on file  Lifestyle  . Physical activity:    Days per week: Not on file    Minutes per session: Not on file  . Stress: Not on file  Relationships  . Social connections:    Talks on phone: Not on file    Gets together: Not on file    Attends religious service: Not on file    Active member of club or organization: Not on file    Attends meetings of clubs or organizations: Not on file    Relationship status: Not on file  . Intimate partner violence:    Fear of current or ex partner: Not on file    Emotionally abused: Not on file    Physically abused: Not on file    Forced sexual activity: Not on file  Other Topics Concern  . Not on file  Social History Narrative   Independent at baseline   Family History  Problem Relation Age of Onset  . Hypertension Mother   . Heart failure Mother   . Hypertension Father   . CAD Father   . Heart attack Father     Review of Systems  Constitutional: Negative.   Respiratory: Negative.   Cardiovascular: Positive for palpitations and leg swelling. Negative for chest pain.  Gastrointestinal: Positive for anal bleeding, blood in stool and constipation. Negative for  abdominal distention, abdominal pain, diarrhea, nausea, rectal pain and vomiting.  Genitourinary: Negative.   Musculoskeletal: Negative.   Neurological:  Negative.   Psychiatric/Behavioral: Negative.     Per HPI unless specifically indicated above     Objective:    BP 130/72   Pulse 73   Wt 235 lb 8 oz (106.8 kg)   SpO2 98%   BMI 30.65 kg/m   Wt Readings from Last 3 Encounters:  05/13/18 235 lb 8 oz (106.8 kg)  05/12/18 236 lb (107 kg)  05/08/18 244 lb (110.7 kg)    Physical Exam  Constitutional: He is oriented to person, place, and time. He appears well-developed and well-nourished. No distress.  HENT:  Head: Normocephalic and atraumatic.  Right Ear: Hearing normal.  Left Ear: Hearing normal.  Nose: Nose normal.  Eyes: Conjunctivae and lids are normal. Right eye exhibits no discharge. Left eye exhibits no discharge. No scleral icterus.  Cardiovascular: Normal rate, regular rhythm, normal heart sounds and intact distal pulses. Exam reveals no gallop and no friction rub.  No murmur heard. Pulmonary/Chest: Effort normal and breath sounds normal. No stridor. No respiratory distress. He has no wheezes. He has no rales. He exhibits no tenderness.  Genitourinary: Prostate normal. Rectal exam shows guaiac positive stool. Rectal exam shows no external hemorrhoid, no internal hemorrhoid, no fissure, no mass, no tenderness and anal tone normal. Prostate is not enlarged and not tender.  Musculoskeletal: Normal range of motion.  Neurological: He is alert and oriented to person, place, and time.  Skin: Skin is warm, dry and intact. Capillary refill takes less than 2 seconds. No rash noted. He is not diaphoretic. No erythema. No pallor.  Psychiatric: He has a normal mood and affect. His speech is normal and behavior is normal. Judgment and thought content normal. Cognition and memory are normal.  Nursing note and vitals reviewed.   Results for orders placed or performed in visit on  05/13/18  IFOBT POC (occult bld, rslt in office)  Result Value Ref Range   IFOBT Positive       Assessment & Plan:   Problem List Items Addressed This Visit      Cardiovascular and Mediastinum   Acute on chronic systolic CHF (congestive heart failure) (HCC)    Acute flare seems to be better. Euvolemic. Continue to follow with cardiology. Call with any concerns. Continue to monitor.       Relevant Orders   Comprehensive metabolic panel   CBC With Differential/Platelet   Frequent PVCs    To have ablation done in about a month. Continue to follow with cardiology. Call with any concerns. Continue to monitor.       Relevant Orders   Comprehensive metabolic panel   CBC With Differential/Platelet     Genitourinary   Benign hypertensive renal disease    Under good control. Call with any concerns. Continue to monitor.       Relevant Orders   Comprehensive metabolic panel   CBC With Differential/Platelet   CKD (chronic kidney disease) stage 3, GFR 30-59 ml/min (HCC)    Rechecking levels today. Await results. Continue to monitor.       Relevant Orders   Comprehensive metabolic panel   CBC With Differential/Platelet   ED (erectile dysfunction)    Cannot prescribe viagra/cialis etc due to use of nitro. Will refer to urology for further evaluation and treatment. Referral generated today.      Relevant Orders   Ambulatory referral to Urology     Other   Hyperlipidemia    Stable on atorvastatin. Rechecking levels today. Await results. Continue to monitor.  Relevant Orders   Lipid Panel w/o Chol/HDL Ratio   Comprehensive metabolic panel   CBC With Differential/Platelet   Gout    Rechecking levels today. Await results. Continue to monitor.       Relevant Orders   Comprehensive metabolic panel   CBC With Differential/Platelet   Uric acid    Other Visit Diagnoses    Rectal bleeding    -  Primary   +FOBT. +Melena. Hx of GI hemorrhage. Urgent referral to GI placed.  Checking CBC- await results.    Relevant Orders   Ambulatory referral to Gastroenterology   Comprehensive metabolic panel   CBC With Differential/Platelet   IFOBT POC (occult bld, rslt in office) (Completed)   Immunization due       Tdap and pneumovax given today.   Relevant Orders   Pneumococcal polysaccharide vaccine 23-valent greater than or equal to 2yo subcutaneous/IM (Completed)   Tdap vaccine greater than or equal to 7yo IM (Completed)       Follow up plan: Return in about 1 month (around 06/10/2018).

## 2018-05-13 NOTE — Assessment & Plan Note (Signed)
Under good control. Call with any concerns. Continue to monitor.

## 2018-05-13 NOTE — Assessment & Plan Note (Signed)
To have ablation done in about a month. Continue to follow with cardiology. Call with any concerns. Continue to monitor.

## 2018-05-13 NOTE — Telephone Encounter (Signed)
New Message   Patient sister Cam is calling on behalf of patient. They are requesteing tht the ablation procedure be moved from 06/04 to the following week. The week of 06/09/2018. Please call to discuss.

## 2018-05-13 NOTE — Assessment & Plan Note (Signed)
Cannot prescribe viagra/cialis etc due to use of nitro. Will refer to urology for further evaluation and treatment. Referral generated today.

## 2018-05-13 NOTE — Assessment & Plan Note (Signed)
Acute flare seems to be better. Euvolemic. Continue to follow with cardiology. Call with any concerns. Continue to monitor.

## 2018-05-14 LAB — LIPID PANEL W/O CHOL/HDL RATIO
CHOLESTEROL TOTAL: 240 mg/dL — AB (ref 100–199)
HDL: 49 mg/dL (ref >39)
LDL CALC: 167 mg/dL — AB (ref 0–99)
TRIGLYCERIDES: 122 mg/dL (ref 0–149)
VLDL CHOLESTEROL CAL: 24 mg/dL (ref 5–40)

## 2018-05-14 LAB — COMPREHENSIVE METABOLIC PANEL
A/G RATIO: 1.3 (ref 1.2–2.2)
ALT: 23 IU/L (ref 0–44)
AST: 21 IU/L (ref 0–40)
Albumin: 4.5 g/dL (ref 3.5–5.5)
Alkaline Phosphatase: 120 IU/L — ABNORMAL HIGH (ref 39–117)
BILIRUBIN TOTAL: 0.4 mg/dL (ref 0.0–1.2)
BUN/Creatinine Ratio: 25 — ABNORMAL HIGH (ref 9–20)
BUN: 40 mg/dL — ABNORMAL HIGH (ref 6–24)
CHLORIDE: 96 mmol/L (ref 96–106)
CO2: 24 mmol/L (ref 20–29)
Calcium: 10.2 mg/dL (ref 8.7–10.2)
Creatinine, Ser: 1.61 mg/dL — ABNORMAL HIGH (ref 0.76–1.27)
GFR calc Af Amer: 56 mL/min/{1.73_m2} — ABNORMAL LOW (ref >59)
GFR calc non Af Amer: 48 mL/min/{1.73_m2} — ABNORMAL LOW (ref >59)
GLOBULIN, TOTAL: 3.6 g/dL (ref 1.5–4.5)
Glucose: 79 mg/dL (ref 65–99)
POTASSIUM: 3.8 mmol/L (ref 3.5–5.2)
SODIUM: 141 mmol/L (ref 134–144)
Total Protein: 8.1 g/dL (ref 6.0–8.5)

## 2018-05-14 LAB — URIC ACID: Uric Acid: 14.7 mg/dL — ABNORMAL HIGH (ref 3.7–8.6)

## 2018-05-15 ENCOUNTER — Encounter: Payer: Self-pay | Admitting: Gastroenterology

## 2018-05-19 ENCOUNTER — Telehealth: Payer: Self-pay | Admitting: Family Medicine

## 2018-05-19 ENCOUNTER — Telehealth: Payer: Self-pay | Admitting: Internal Medicine

## 2018-05-19 MED ORDER — FEBUXOSTAT 40 MG PO TABS: 40.0000 mg | ORAL_TABLET | Freq: Every day | ORAL | 3 refills | Status: DC

## 2018-05-19 MED ORDER — ATORVASTATIN CALCIUM 80 MG PO TABS: 80.0000 mg | ORAL_TABLET | Freq: Every day | ORAL | 3 refills | Status: DC

## 2018-05-19 NOTE — Telephone Encounter (Signed)
New Message:       Pt is calling to inquire about some disability forms for the pt. Pt states the fax number should be provided on the forms.

## 2018-05-19 NOTE — Telephone Encounter (Signed)
Please let him know that everything looks OK, but nt great. His cholesterol is pretty high, so I'd like to increase his atorvastatin to 80mg  and see how he does. His kidney function is up a little bit, probably because of the fluid pills he's been on. His uric acid is also quite high, so i'm going to start him on some medicine to try to bring it back down. He should take his colchicine for the first 3 days he starts his uloric. Everything else looks stable. We;ll recheck things when he comes back in in June.

## 2018-05-20 ENCOUNTER — Telehealth (HOSPITAL_COMMUNITY): Payer: Self-pay | Admitting: *Deleted

## 2018-05-20 NOTE — Telephone Encounter (Signed)
   Thanks for the update.  Cathyrn Deas NP-C  11:55 AM

## 2018-05-20 NOTE — Telephone Encounter (Signed)
Patient's sister notified.

## 2018-05-20 NOTE — Telephone Encounter (Signed)
Advanced Heart Failure Triage Encounter  Patient Name: Stephen [name]  Date of Call: 05/20/18  Problem:  Patient's sister called stating he was seen by his PCP and was started on Uloric for gout.  Also his lipids were high and they increased his atorvastatin to 80 mg daily.  She wanted to make sure Dr. Haroldine Laws was ok with these medication changes.  Plan:  I will forward to Darrick Grinder, NP to review and if changes are needed I will call patient's sister back.  Otherwise I told her to continue medication changes as instructed.   Darron Doom, RN

## 2018-05-27 ENCOUNTER — Other Ambulatory Visit
Admission: RE | Admit: 2018-05-27 | Discharge: 2018-05-27 | Disposition: A | Discharge status: Home / Self Care | Payer: Commercial Managed Care - PPO | Source: Ambulatory Visit | Attending: Internal Medicine | Admitting: Internal Medicine

## 2018-05-27 ENCOUNTER — Other Ambulatory Visit (HOSPITAL_COMMUNITY): Payer: Self-pay | Admitting: *Deleted

## 2018-05-27 ENCOUNTER — Other Ambulatory Visit: Payer: Commercial Managed Care - PPO

## 2018-05-27 DIAGNOSIS — I5022 Chronic systolic (congestive) heart failure: Secondary | ICD-10-CM

## 2018-05-27 DIAGNOSIS — M109 Gout, unspecified: Secondary | ICD-10-CM | POA: Insufficient documentation

## 2018-05-27 LAB — BASIC METABOLIC PANEL
ANION GAP: 9 (ref 5–15)
BUN: 32 mg/dL — ABNORMAL HIGH (ref 6–20)
CO2: 29 mmol/L (ref 22–32)
Calcium: 9.3 mg/dL (ref 8.9–10.3)
Chloride: 99 mmol/L — ABNORMAL LOW (ref 101–111)
Creatinine, Ser: 1.4 mg/dL — ABNORMAL HIGH (ref 0.61–1.24)
GFR calc Af Amer: >60 mL/min (ref >60)
GFR, EST NON AFRICAN AMERICAN: 56 mL/min — AB (ref >60)
Glucose, Bld: 128 mg/dL — ABNORMAL HIGH (ref 65–99)
POTASSIUM: 3.2 mmol/L — AB (ref 3.5–5.1)
SODIUM: 137 mmol/L (ref 135–145)

## 2018-05-27 LAB — CBC WITH DIFFERENTIAL/PLATELET
BASOS ABS: 0.1 10*3/uL (ref 0–0.1)
Basophils Relative: 1 %
EOS ABS: 0.7 10*3/uL (ref 0–0.7)
EOS PCT: 8 %
HCT: 48 % (ref 40.0–52.0)
Hemoglobin: 16.1 g/dL (ref 13.0–18.0)
LYMPHS PCT: 25 %
Lymphs Abs: 2.1 10*3/uL (ref 1.0–3.6)
MCH: 29.8 pg (ref 26.0–34.0)
MCHC: 33.6 g/dL (ref 32.0–36.0)
MCV: 88.6 fL (ref 80.0–100.0)
Monocytes Absolute: 0.7 10*3/uL (ref 0.2–1.0)
Monocytes Relative: 8 %
Neutro Abs: 4.9 10*3/uL (ref 1.4–6.5)
Neutrophils Relative %: 58 %
PLATELETS: 213 10*3/uL (ref 150–440)
RBC: 5.41 MIL/uL (ref 4.40–5.90)
RDW: 14 % (ref 11.5–14.5)
WBC: 8.5 10*3/uL (ref 3.8–10.6)

## 2018-05-28 ENCOUNTER — Ambulatory Visit: Payer: Commercial Managed Care - PPO | Admitting: Gastroenterology

## 2018-05-30 ENCOUNTER — Encounter (HOSPITAL_COMMUNITY): Payer: Self-pay | Admitting: Internal Medicine

## 2018-05-30 ENCOUNTER — Other Ambulatory Visit: Payer: Self-pay

## 2018-05-30 ENCOUNTER — Ambulatory Visit (HOSPITAL_BASED_OUTPATIENT_CLINIC_OR_DEPARTMENT_OTHER)
Admission: RE | Admit: 2018-05-30 | Discharge: 2018-05-30 | Disposition: A | Discharge status: Home / Self Care | Payer: Commercial Managed Care - PPO | Source: Ambulatory Visit | Attending: Internal Medicine | Admitting: Internal Medicine

## 2018-05-30 ENCOUNTER — Encounter (HOSPITAL_COMMUNITY): Payer: Commercial Managed Care - PPO | Admitting: Internal Medicine

## 2018-05-30 ENCOUNTER — Ambulatory Visit (HOSPITAL_COMMUNITY)
Admission: RE | Admit: 2018-05-30 | Discharge: 2018-05-30 | Disposition: A | Discharge status: Home / Self Care | Payer: Commercial Managed Care - PPO | Source: Ambulatory Visit | Attending: Family Medicine | Admitting: Family Medicine

## 2018-05-30 VITALS — BP 97/70 | HR 85 | Wt 237.0 lb

## 2018-05-30 DIAGNOSIS — I469 Cardiac arrest, cause unspecified: Secondary | ICD-10-CM | POA: Insufficient documentation

## 2018-05-30 DIAGNOSIS — I5022 Chronic systolic (congestive) heart failure: Secondary | ICD-10-CM | POA: Diagnosis not present

## 2018-05-30 DIAGNOSIS — I340 Nonrheumatic mitral (valve) insufficiency: Secondary | ICD-10-CM | POA: Diagnosis not present

## 2018-05-30 DIAGNOSIS — I493 Ventricular premature depolarization: Secondary | ICD-10-CM | POA: Diagnosis not present

## 2018-05-30 DIAGNOSIS — G459 Transient cerebral ischemic attack, unspecified: Secondary | ICD-10-CM | POA: Insufficient documentation

## 2018-05-30 NOTE — Progress Notes (Signed)
  Echocardiogram 2D Echocardiogram has been performed.  Jannett Celestine 05/30/2018, 2:55 PM

## 2018-05-30 NOTE — Progress Notes (Signed)
Advanced Heart Failure Clinic Note   Primary Care: [name], MD Primary Cardiologist: Dr Fletcher Anon Primary EP: [name]  HPI: Stephen [name] is a 52 y.o. 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   Has several year history of systolic HF due to NICM with EF 15-20%. 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 Followed originally by [name] then transferred to Dr. Fletcher Anon. 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   Admitted 12/23/17 -> 12/31/17. Pt presented to [facility], but transferred to [facility] for management of worsening HF. Shock, Influenzae A, and VF arrest. Swan placed. Started on NE and milrinone with improved output. Pt had multiple episodes of VT. S/p MDT ICD 12/30/17 Medtronic.   Today he returns for HF follow up. Last visit torsemide and entresto were increased. Says he is feeling much better. Able to do much more. SOB after walking ~20 minutes. He can grocery shop without stopping. No orthopnea, PND, or edema. No CP, dizziness, or palpitations.  Off amiodarone right now with ablation on 06/10/18. Taking all meds. Weights 234-235 lbs. Drinks ~2 L/day. Limits salt intake.   Cardiac MRI 12/28/2017: severe LV dilation, EF 16%, mildly decreased RV systolic function, small area of mid-wall LGE in the mid inferolateral wall.   Review of systems complete and found to be negative unless listed in HPI.   Past Medical History:  Diagnosis Date  . AKI (acute kidney injury) ([place]) 12/24/2017  . Arrhythmia   . Cardiac arrest ([place]) 12/23/2017   Brief V-fib arrest  . Cardiac arrest ([place])   . Chest pain 09/08/2017  . CHF (congestive heart failure) ([facility])    nonischemic cardiomyopathy, EF 25%  . Hypertension   . Influenza A 12/24/2017  . NICM (nonischemic cardiomyopathy) ([place])   . Pneumonia 12/20/2017  . TIA (transient ischemic attack) 06/21/16   Current Outpatient Medications  Medication Sig Dispense Refill  . acetaminophen (TYLENOL) 325 MG tablet Take 2 tablets (650 mg total) by mouth every 6  (six) hours as needed. (Patient taking differently: Take 500 mg by mouth every 6 (six) hours as needed. ) 60 tablet 0  . albuterol (PROVENTIL HFA;VENTOLIN HFA) 108 (90 Base) MCG/ACT inhaler Inhale 2 puffs into the lungs every 4 (four) hours as needed for wheezing or shortness of breath. 1 Inhaler 0  . amiodarone (PACERONE) 200 MG tablet Take 400 mg by mouth daily.     Marland Kitchen aspirin EC 81 MG EC tablet Take 1 tablet (81 mg total) by mouth daily. 90 tablet 3  . atorvastatin (LIPITOR) 80 MG tablet Take 1 tablet (80 mg total) by mouth daily at 6 PM. 30 tablet 3  . Camphor-Eucalyptus-Menthol (CHEST RUB) 4.8-1.2-2.6 % OINT Apply 1 application topically daily as needed (congestino).     . carvedilol (COREG) 6.25 MG tablet Take 1 tablet (6.25 mg total) by mouth 2 (two) times daily with a meal. 60 tablet 6  . clopidogrel (PLAVIX) 75 MG tablet Take 1 tablet (75 mg total) by mouth daily. 30 tablet 5  . colchicine 0.6 MG tablet Take 0.6 mg by mouth daily.    . diclofenac sodium (VOLTAREN) 1 % GEL Apply 2 g topically 4 (four) times daily as needed (pain).     Marland Kitchen docusate sodium (COLACE) 100 MG capsule Take 1 capsule (100 mg total) by mouth 2 (two) times daily as needed for mild constipation. 10 capsule 0  . febuxostat (ULORIC) 40 MG tablet Take 1 tablet (40 mg total) by mouth daily. 30 tablet 3  . fluticasone (FLONASE)  50 MCG/ACT nasal spray Place 2 sprays into both nostrils daily. 16 g 2  . mexiletine (MEXITIL) 150 MG capsule Take 1 capsule (150 mg total) by mouth 2 (two) times daily. 60 capsule 3  . nitroGLYCERIN (NITROSTAT) 0.4 MG SL tablet Place 1 tablet (0.4 mg total) under the tongue every 5 (five) minutes as needed for chest pain. 25 tablet 3  . potassium chloride SA (K-DUR,KLOR-CON) 20 MEQ tablet Take 1 tablet (20 mEq total) by mouth daily. 90 tablet 3  . sacubitril-valsartan (ENTRESTO) 49-51 MG Take 1 tablet by mouth 2 (two) times daily. 60 tablet 3  . spironolactone (ALDACTONE) 25 MG tablet Take 1 tablet (25  mg total) by mouth daily. 30 tablet 5  . Tiotropium Bromide Monohydrate (SPIRIVA RESPIMAT) 2.5 MCG/ACT AERS Inhale 1 Act into the lungs daily.    Marland Kitchen torsemide (DEMADEX) 20 MG tablet Take 2 tablets (40 mg total) by mouth 2 (two) times daily. 120 tablet 3  . traMADol (ULTRAM) 50 MG tablet Take 1 tablet (50 mg total) by mouth every 6 (six) hours as needed. 8 tablet 0   No current facility-administered medications for this encounter.    Allergies  Allergen Reactions  . Lisinopril Cough   Social History   Socioeconomic History  . Marital status: Single    Spouse name: Not on file  . Number of children: Not on file  . Years of education: Not on file  . Highest education level: Not on file  Occupational History  . Not on file  Social Needs  . Financial resource strain: Not on file  . Food insecurity:    Worry: Not on file    Inability: Not on file  . Transportation needs:    Medical: Not on file    Non-medical: Not on file  Tobacco Use  . Smoking status: Former Smoker    Packs/day: 0.25    Years: 20.00    Pack years: 5.00    Types: Cigarettes    Last attempt to quit: 02/20/2016    Years since quitting: 2.2  . Smokeless tobacco: Never Used  Substance and Sexual Activity  . Alcohol use: Yes    Alcohol/week: 0.0 oz    Comment: beer occasional  . Drug use: No  . Sexual activity: Not on file  Lifestyle  . Physical activity:    Days per week: Not on file    Minutes per session: Not on file  . Stress: Not on file  Relationships  . Social connections:    Talks on phone: Not on file    Gets together: Not on file    Attends religious service: Not on file    Active member of club or organization: Not on file    Attends meetings of clubs or organizations: Not on file    Relationship status: Not on file  . Intimate partner violence:    Fear of current or ex partner: Not on file    Emotionally abused: Not on file    Physically abused: Not on file    Forced sexual activity: Not  on file  Other Topics Concern  . Not on file  Social History Narrative   Independent at baseline   Family History  Problem Relation Age of Onset  . Hypertension Mother   . Heart failure Mother   . Hypertension Father   . CAD Father   . Heart attack Father    Hospital Outpatient Visit on 05/27/2018  Component Date Value Ref Range Status  .  WBC 05/27/2018 8.5  3.8 - 10.6 K/uL Final  . RBC 05/27/2018 5.41  4.40 - 5.90 MIL/uL Final  . Hemoglobin 05/27/2018 16.1  13.0 - 18.0 g/dL Final  . HCT 05/27/2018 48.0  40.0 - 52.0 % Final  . MCV 05/27/2018 88.6  80.0 - 100.0 fL Final  . MCH 05/27/2018 29.8  26.0 - 34.0 pg Final  . MCHC 05/27/2018 33.6  32.0 - 36.0 g/dL Final  . RDW 05/27/2018 14.0  11.5 - 14.5 % Final  . Platelets 05/27/2018 213  150 - 440 K/uL Final  . Neutrophils Relative % 05/27/2018 58  % Final  . Neutro Abs 05/27/2018 4.9  1.4 - 6.5 K/uL Final  . Lymphocytes Relative 05/27/2018 25  % Final  . Lymphs Abs 05/27/2018 2.1  1.0 - 3.6 K/uL Final  . Monocytes Relative 05/27/2018 8  % Final  . Monocytes Absolute 05/27/2018 0.7  0.2 - 1.0 K/uL Final  . Eosinophils Relative 05/27/2018 8  % Final  . Eosinophils Absolute 05/27/2018 0.7  0 - 0.7 K/uL Final  . Basophils Relative 05/27/2018 1  % Final  . Basophils Absolute 05/27/2018 0.1  0 - 0.1 K/uL Final   Performed at [facility], [address] [place], [place] [address]  . Sodium 05/27/2018 137  135 - 145 mmol/L Final  . Potassium 05/27/2018 3.2* 3.5 - 5.1 mmol/L Final  . Chloride 05/27/2018 99* 101 - 111 mmol/L Final  . CO2 05/27/2018 29  22 - 32 mmol/L Final  . Glucose, Bld 05/27/2018 128* 65 - 99 mg/dL Final  . BUN 05/27/2018 32* 6 - 20 mg/dL Final  . Creatinine, Ser 05/27/2018 1.40* 0.61 - 1.24 mg/dL Final  . Calcium 05/27/2018 9.3  8.9 - 10.3 mg/dL Final  . GFR calc non Af Amer 05/27/2018 56* >60 mL/min Final  . GFR calc Af Amer 05/27/2018 >60  >60 mL/min Final   Comment: (NOTE) The eGFR has been  calculated using the CKD EPI equation. This calculation has not been validated in all clinical situations. eGFR's persistently <60 mL/min signify possible Chronic Kidney Disease.   Georgiann Hahn gap 05/27/2018 9  5 - 15 Final   Performed at [facility], [place],  [address]   Vitals:   05/30/18 1448  BP: 97/70  Pulse: 85  SpO2: 100%  Weight: 237 lb (107.5 kg)   Wt Readings from Last 3 Encounters:  05/30/18 237 lb (107.5 kg)  05/13/18 235 lb 8 oz (106.8 kg)  05/12/18 236 lb (107 kg)    PHYSICAL EXAM: General: Well appearing. No resp difficulty. HEENT: Normal Neck: Supple. JVP 5-6. Carotids 2+ bilat; no bruits. No thyromegaly or nodule noted. Cor: PMI laterally displaced. RRR, soft s3 Lungs: CTAB, normal effort. Abdomen: Soft, non-tender, non-distended, no HSM. No bruits or masses. +BS  Extremities: No cyanosis, clubbing, or rash. R and LLE no edema.  Neuro: Alert & orientedx3, cranial nerves grossly intact. moves all 4 extremities w/o difficulty. Affect pleasant   ASSESSMENT & PLAN:  1. Chronic systolic HFwith cardiogenic shock: Due to NiCM. EF 20%.  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  Cardiac MRI 12/18 with EF 16%, severe LV dilation and small area of mid-wall LGE in the mid inferolateral wall.  Possible prior myocarditis as cause of cardiomyopathy with PVCs contributing.  Consider cardiac sarcoidosis with LGE pattern but review of 9/18 CTA chest did not show signs of pulmonary sarcoidosis or hilar adenopathy.  Now off milrinone. Echo 10/18 EF 25%. -  Echo today reviewed by Dr Haroldine Laws: EF 10-15%.   - Stable NYHA II-III.   - Volume status stable.  - Continue Coreg 6.25 mg BID. Hold off increasing with soft BPs - Continue entresto to 49/51 bid   - Continue spironolactone 25 daily. - Consider PET scan for sarcoid - Schedule CPX  2. VF arrest on 12/24 at [facility]: s/p [employer] ICD.  - Holding amiodarone right now for PVC/VT ablation on 06/10/18.  -  Continue coreg as above.  - No driving x 6 months. June 2019. Pt aware. His aunt drove him today.   3. CKD III: - Creatinine stable 1.4 on labs 5/28   4. Frequent PVCs:  - PVCs persist despite Amio and mexilitene  - Saw Dr Caryl Comes. He is going to do PVC/VT ablation on 06/10/18. Holding amiodarone right now.   Follow up in 6-8 weeks  Georgiana Shore, NP 05/30/18   Patient seen and examined with the above-signed Advanced Practice Provider and/or Housestaff. I personally reviewed laboratory data, imaging studies and relevant notes. I independently examined the patient and formulated the important aspects of the plan. I have edited the note to reflect any of my changes or salient points. I have personally discussed the plan with the patient and/or family.  Clinically much improved NYHA II-III but I have reviewed echo and still with very severe LV dysfunction EF 10-15%. RV not too bad. 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 Will need to follow very closely. If not improving, will need to consider expediting work-up for advanced therapies.   Glori Bickers, MD  12:46 PM

## 2018-05-30 NOTE — Patient Instructions (Signed)
Your provider requests you have a cardiopulmonary exercise test.  Follow up in 6-8 weeks with Dr.Bensimon.

## 2018-06-03 ENCOUNTER — Other Ambulatory Visit: Payer: Self-pay

## 2018-06-03 MED ORDER — POTASSIUM CHLORIDE CRYS ER 20 MEQ PO TBCR: 20.0000 meq | EXTENDED_RELEASE_TABLET | Freq: Two times a day (BID) | ORAL | 3 refills | Status: DC

## 2018-06-03 NOTE — Progress Notes (Signed)
Increase potassium to 20 meq one tablet by mouth twice a day.  Will recheck.

## 2018-06-04 LAB — CUP PACEART INCLINIC DEVICE CHECK
Date Time Interrogation Session: 20190605154844
Implantable Lead Implant Date: 20181231
Implantable Lead Location: 753860
Lead Channel Setting Pacing Amplitude: 2.5 V
Lead Channel Setting Pacing Pulse Width: 1 ms
Lead Channel Setting Sensing Sensitivity: 0.3 mV
MDC IDC PG IMPLANT DT: 20181231

## 2018-06-10 ENCOUNTER — Encounter (HOSPITAL_COMMUNITY): Payer: Self-pay | Admitting: Certified Registered Nurse Anesthetist

## 2018-06-10 ENCOUNTER — Other Ambulatory Visit: Payer: Self-pay

## 2018-06-10 ENCOUNTER — Ambulatory Visit (HOSPITAL_COMMUNITY)
Admission: RE | Admit: 2018-06-10 | Discharge: 2018-06-11 | Disposition: A | Discharge status: Home / Self Care | Payer: Commercial Managed Care - PPO | Source: Ambulatory Visit | Attending: Internal Medicine | Admitting: Internal Medicine

## 2018-06-10 ENCOUNTER — Ambulatory Visit (HOSPITAL_COMMUNITY): Payer: Commercial Managed Care - PPO | Admitting: Certified Registered Nurse Anesthetist

## 2018-06-10 ENCOUNTER — Ambulatory Visit (HOSPITAL_COMMUNITY)
Admission: RE | Disposition: A | Discharge status: Home / Self Care | Payer: Self-pay | Source: Ambulatory Visit | Attending: Internal Medicine

## 2018-06-10 DIAGNOSIS — Z8249 Family history of ischemic heart disease and other diseases of the circulatory system: Secondary | ICD-10-CM | POA: Insufficient documentation

## 2018-06-10 DIAGNOSIS — Z7951 Long term (current) use of inhaled steroids: Secondary | ICD-10-CM | POA: Insufficient documentation

## 2018-06-10 DIAGNOSIS — M109 Gout, unspecified: Secondary | ICD-10-CM | POA: Insufficient documentation

## 2018-06-10 DIAGNOSIS — Z7982 Long term (current) use of aspirin: Secondary | ICD-10-CM | POA: Insufficient documentation

## 2018-06-10 DIAGNOSIS — I472 Ventricular tachycardia, unspecified: Secondary | ICD-10-CM

## 2018-06-10 DIAGNOSIS — Z8673 Personal history of transient ischemic attack (TIA), and cerebral infarction without residual deficits: Secondary | ICD-10-CM | POA: Insufficient documentation

## 2018-06-10 DIAGNOSIS — I493 Ventricular premature depolarization: Secondary | ICD-10-CM | POA: Diagnosis not present

## 2018-06-10 DIAGNOSIS — I5022 Chronic systolic (congestive) heart failure: Secondary | ICD-10-CM | POA: Diagnosis not present

## 2018-06-10 DIAGNOSIS — Z8674 Personal history of sudden cardiac arrest: Secondary | ICD-10-CM | POA: Diagnosis not present

## 2018-06-10 DIAGNOSIS — Z87891 Personal history of nicotine dependence: Secondary | ICD-10-CM | POA: Diagnosis not present

## 2018-06-10 DIAGNOSIS — I428 Other cardiomyopathies: Secondary | ICD-10-CM | POA: Insufficient documentation

## 2018-06-10 DIAGNOSIS — Z95810 Presence of automatic (implantable) cardiac defibrillator: Secondary | ICD-10-CM | POA: Insufficient documentation

## 2018-06-10 DIAGNOSIS — Z7902 Long term (current) use of antithrombotics/antiplatelets: Secondary | ICD-10-CM | POA: Diagnosis not present

## 2018-06-10 DIAGNOSIS — I130 Hypertensive heart and chronic kidney disease with heart failure and stage 1 through stage 4 chronic kidney disease, or unspecified chronic kidney disease: Secondary | ICD-10-CM | POA: Diagnosis not present

## 2018-06-10 DIAGNOSIS — N189 Chronic kidney disease, unspecified: Secondary | ICD-10-CM | POA: Insufficient documentation

## 2018-06-10 HISTORY — DX: Personal history of urinary calculi: Z87.442

## 2018-06-10 HISTORY — DX: Pure hypercholesterolemia, unspecified: E78.00

## 2018-06-10 HISTORY — DX: Gout, unspecified: M10.9

## 2018-06-10 HISTORY — DX: Headache: R51

## 2018-06-10 HISTORY — DX: Unspecified osteoarthritis, unspecified site: M19.90

## 2018-06-10 HISTORY — DX: Presence of automatic (implantable) cardiac defibrillator: Z95.810

## 2018-06-10 HISTORY — DX: Headache, unspecified: R51.9

## 2018-06-10 HISTORY — PX: V TACH ABLATION: EP1227

## 2018-06-10 LAB — POCT ACTIVATED CLOTTING TIME
Activated Clotting Time: 219 seconds
Activated Clotting Time: 213 seconds
Activated Clotting Time: 169 seconds

## 2018-06-10 LAB — BASIC METABOLIC PANEL
ANION GAP: 15 (ref 5–15)
BUN: 53 mg/dL — ABNORMAL HIGH (ref 6–20)
CALCIUM: 9.5 mg/dL (ref 8.9–10.3)
CO2: 29 mmol/L (ref 22–32)
Chloride: 91 mmol/L — ABNORMAL LOW (ref 101–111)
Creatinine, Ser: 2.28 mg/dL — ABNORMAL HIGH (ref 0.61–1.24)
GFR, EST AFRICAN AMERICAN: 36 mL/min — AB (ref >60)
GFR, EST NON AFRICAN AMERICAN: 31 mL/min — AB (ref >60)
GLUCOSE: 120 mg/dL — AB (ref 65–99)
POTASSIUM: 3 mmol/L — AB (ref 3.5–5.1)
SODIUM: 135 mmol/L (ref 135–145)

## 2018-06-10 SURGERY — V TACH ABLATION
Anesthesia: General

## 2018-06-10 MED ORDER — PROPOFOL 500 MG/50ML IV EMUL
INTRAVENOUS | Status: DC | PRN
  Administered 2018-06-10: 25 ug/kg/min via INTRAVENOUS

## 2018-06-10 MED ORDER — HEPARIN (PORCINE) IN NACL 1000-0.9 UT/500ML-% IV SOLN
INTRAVENOUS | Status: AC
  Filled 2018-06-10: qty 500

## 2018-06-10 MED ORDER — SACUBITRIL-VALSARTAN 49-51 MG PO TABS
1.0000 | ORAL_TABLET | Freq: Two times a day (BID) | ORAL | Status: DC
  Administered 2018-06-11: 11:00:00 1 via ORAL
  Filled 2018-06-10: qty 1

## 2018-06-10 MED ORDER — SODIUM CHLORIDE 0.9 % IV SOLN: 250.0000 mL | INTRAVENOUS | Status: DC | PRN

## 2018-06-10 MED ORDER — TRAMADOL HCL 50 MG PO TABS: 50.0000 mg | ORAL_TABLET | Freq: Four times a day (QID) | ORAL | Status: DC | PRN

## 2018-06-10 MED ORDER — HYDROCODONE-ACETAMINOPHEN 5-325 MG PO TABS: 1.0000 | ORAL_TABLET | ORAL | Status: DC | PRN

## 2018-06-10 MED ORDER — ONDANSETRON HCL 4 MG/2ML IJ SOLN
INTRAMUSCULAR | Status: AC
  Filled 2018-06-10: qty 2

## 2018-06-10 MED ORDER — MIDAZOLAM HCL 5 MG/5ML IJ SOLN
INTRAMUSCULAR | Status: DC | PRN
  Administered 2018-06-10 (×2): 1 mg via INTRAVENOUS

## 2018-06-10 MED ORDER — POTASSIUM CHLORIDE 10 MEQ/100ML IV SOLN
10.0000 meq | INTRAVENOUS | Status: AC
  Administered 2018-06-10 (×2): 10 meq via INTRAVENOUS
  Filled 2018-06-10 (×2): qty 100

## 2018-06-10 MED ORDER — ONDANSETRON HCL 4 MG/2ML IJ SOLN: 4.0000 mg | Freq: Once | INTRAMUSCULAR | Status: DC | PRN

## 2018-06-10 MED ORDER — ASPIRIN EC 81 MG PO TBEC
81.0000 mg | DELAYED_RELEASE_TABLET | Freq: Every day | ORAL | Status: DC
  Administered 2018-06-11: 81 mg via ORAL
  Filled 2018-06-10: qty 1

## 2018-06-10 MED ORDER — SODIUM CHLORIDE 0.9 % IV SOLN
INTRAVENOUS | Status: DC
  Administered 2018-06-10: 06:00:00 via INTRAVENOUS

## 2018-06-10 MED ORDER — HEPARIN SODIUM (PORCINE) 1000 UNIT/ML IJ SOLN
INTRAMUSCULAR | Status: DC | PRN
  Administered 2018-06-10: 1000 [IU] via INTRAVENOUS

## 2018-06-10 MED ORDER — SODIUM CHLORIDE 0.9% FLUSH
3.0000 mL | Freq: Two times a day (BID) | INTRAVENOUS | Status: DC
  Administered 2018-06-10: 22:00:00 3 mL via INTRAVENOUS

## 2018-06-10 MED ORDER — FENTANYL CITRATE (PF) 100 MCG/2ML IJ SOLN: 25.0000 ug | INTRAMUSCULAR | Status: DC | PRN

## 2018-06-10 MED ORDER — ONDANSETRON HCL 4 MG/2ML IJ SOLN
4.0000 mg | Freq: Four times a day (QID) | INTRAMUSCULAR | Status: DC | PRN
  Administered 2018-06-10: 4 mg via INTRAVENOUS

## 2018-06-10 MED ORDER — ACETAMINOPHEN 325 MG PO TABS
650.0000 mg | ORAL_TABLET | ORAL | Status: DC | PRN
  Administered 2018-06-10: 20:00:00 650 mg via ORAL
  Filled 2018-06-10: qty 2

## 2018-06-10 MED ORDER — DEXTROSE 5 % IV SOLN
INTRAVENOUS | Status: DC | PRN
  Administered 2018-06-10: 20 ug/min via INTRAVENOUS

## 2018-06-10 MED ORDER — CARVEDILOL 6.25 MG PO TABS
6.2500 mg | ORAL_TABLET | Freq: Two times a day (BID) | ORAL | Status: DC
  Administered 2018-06-10 – 2018-06-11 (×2): 6.25 mg via ORAL
  Filled 2018-06-10 (×2): qty 2
  Filled 2018-06-10 (×3): qty 1

## 2018-06-10 MED ORDER — BUPIVACAINE HCL (PF) 0.25 % IJ SOLN
INTRAMUSCULAR | Status: DC | PRN
  Administered 2018-06-10: 30 mL

## 2018-06-10 MED ORDER — SODIUM CHLORIDE 0.9% FLUSH: 3.0000 mL | INTRAVENOUS | Status: DC | PRN

## 2018-06-10 MED ORDER — FENTANYL CITRATE (PF) 100 MCG/2ML IJ SOLN
INTRAMUSCULAR | Status: DC | PRN
  Administered 2018-06-10 (×2): 25 ug via INTRAVENOUS
  Administered 2018-06-10 (×2): 50 ug via INTRAVENOUS

## 2018-06-10 MED ORDER — TIOTROPIUM BROMIDE MONOHYDRATE 18 MCG IN CAPS
1.0000 | ORAL_CAPSULE | Freq: Every day | RESPIRATORY_TRACT | Status: DC
  Filled 2018-06-10: qty 5

## 2018-06-10 MED ORDER — POTASSIUM CHLORIDE ER 10 MEQ PO TBCR
40.0000 meq | EXTENDED_RELEASE_TABLET | Freq: Once | ORAL | Status: AC
  Administered 2018-06-10: 40 meq via ORAL
  Filled 2018-06-10: qty 4

## 2018-06-10 MED ORDER — HEPARIN (PORCINE) IN NACL 2-0.9 UNITS/ML
INTRAMUSCULAR | Status: AC | PRN
  Administered 2018-06-10 (×2): 500 mL

## 2018-06-10 MED ORDER — POTASSIUM CHLORIDE CRYS ER 20 MEQ PO TBCR
20.0000 meq | EXTENDED_RELEASE_TABLET | Freq: Two times a day (BID) | ORAL | Status: DC
  Administered 2018-06-10: 22:00:00 20 meq via ORAL
  Filled 2018-06-10 (×2): qty 1

## 2018-06-10 MED ORDER — OXYCODONE HCL 5 MG/5ML PO SOLN: 5.0000 mg | Freq: Once | ORAL | Status: DC | PRN

## 2018-06-10 MED ORDER — BUPIVACAINE HCL (PF) 0.25 % IJ SOLN
INTRAMUSCULAR | Status: AC
  Filled 2018-06-10: qty 30

## 2018-06-10 MED ORDER — OXYCODONE HCL 5 MG PO TABS: 5.0000 mg | ORAL_TABLET | Freq: Once | ORAL | Status: DC | PRN

## 2018-06-10 MED ORDER — CLOPIDOGREL BISULFATE 75 MG PO TABS
75.0000 mg | ORAL_TABLET | Freq: Every day | ORAL | Status: DC
  Administered 2018-06-11: 09:00:00 75 mg via ORAL
  Filled 2018-06-10: qty 1

## 2018-06-10 MED ORDER — MEXILETINE HCL 150 MG PO CAPS
150.0000 mg | ORAL_CAPSULE | Freq: Two times a day (BID) | ORAL | Status: DC
  Administered 2018-06-10 – 2018-06-11 (×2): 150 mg via ORAL
  Filled 2018-06-10 (×2): qty 1

## 2018-06-10 MED ORDER — HEPARIN SODIUM (PORCINE) 1000 UNIT/ML IJ SOLN
INTRAMUSCULAR | Status: AC
  Filled 2018-06-10: qty 1

## 2018-06-10 MED ORDER — OFF THE BEAT BOOK
Freq: Once | Status: AC
  Administered 2018-06-10: 22:00:00 1
  Filled 2018-06-10: qty 1

## 2018-06-10 MED ORDER — HEPARIN SODIUM (PORCINE) 1000 UNIT/ML IJ SOLN
INTRAMUSCULAR | Status: DC | PRN
  Administered 2018-06-10: 8000 [IU] via INTRAVENOUS
  Administered 2018-06-10: 2000 [IU] via INTRAVENOUS

## 2018-06-10 MED ORDER — AMIODARONE HCL 200 MG PO TABS
200.0000 mg | ORAL_TABLET | Freq: Every day | ORAL | Status: DC
  Administered 2018-06-10: 200 mg via ORAL
  Filled 2018-06-10 (×2): qty 1

## 2018-06-10 SURGICAL SUPPLY — 12 items
BAG SNAP BAND KOVER 36X36 (MISCELLANEOUS) IMPLANT
BLANKET WARM UNDERBOD FULL ACC (MISCELLANEOUS) ×3 IMPLANT
CATH NAVISTAR SMARTTOUCH DF (ABLATOR) ×3 IMPLANT
CATH WEBSTER BI DIR CS D-F CRV (CATHETERS) ×3 IMPLANT
PACK EP LATEX FREE (CUSTOM PROCEDURE TRAY) ×2
PACK EP LF (CUSTOM PROCEDURE TRAY) ×1 IMPLANT
PAD DEFIB LIFELINK (PAD) ×3 IMPLANT
SHEATH AVANTI 11CM 6FR (SHEATH) ×3 IMPLANT
SHEATH AVANTI 11CM 7FR (SHEATH) ×3 IMPLANT
SHEATH AVANTI 11CM 8FR (SHEATH) ×9 IMPLANT
SHIELD RADPAD SCOOP 12X17 (MISCELLANEOUS) ×3 IMPLANT
TUBING SMART ABLATE COOLFLOW (TUBING) ×3 IMPLANT

## 2018-06-10 NOTE — Interval H&P Note (Signed)
History and Physical Interval Note:  06/10/2018 7:25 AM  [name]  has presented today for surgery, with the diagnosis of vtach  The various methods of treatment have been discussed with the patient and family. After consideration of risks, benefits and other options for treatment, the patient has consented to  Procedure(s): V TACH ABLATION (N/A) as a surgical intervention .  The patient's history has been reviewed, patient examined, no change in status, stable for surgery.  I have reviewed the patient's chart and labs.  Questions were answered to the patient's satisfaction.     Thompson Grayer

## 2018-06-10 NOTE — Transfer of Care (Signed)
Immediate Anesthesia Transfer of Care Note  Patient: Stephen [name]  Procedure(s) Performed: Stephanie Coup ABLATION (N/A )  Patient Location: Cath Lab  Anesthesia Type:MAC  Level of Consciousness: awake, alert , oriented and patient cooperative  Airway & Oxygen Therapy: Patient Spontanous Breathing and Patient connected to nasal cannula oxygen  Post-op Assessment: Report given to RN and Post -op Vital signs reviewed and stable  Post vital signs: Reviewed and stable  Last Vitals:  Vitals Value Taken Time  BP    Temp    Pulse    Resp    SpO2      Last Pain:  Vitals:   06/10/18 0609  TempSrc:   PainSc: 6       Patients Stated Pain Goal: 3 (33/54/56 2563)  Complications: No apparent anesthesia complications

## 2018-06-10 NOTE — Discharge Summary (Signed)
ELECTROPHYSIOLOGY PROCEDURE DISCHARGE SUMMARY    Patient ID: Stephen [name],  MRN: [id number], DOB/AGE: 53-10-1966 53 y.o.  Admit date: 06/10/2018 Discharge date: *06/11/18  Primary Care Physician: Valerie Roys, DO  Primary Cardiologist: Dr. Fletcher Anon AHF: Dr. Haroldine Laws Electrophysiologist: [name]  Primary Discharge Diagnosis:  1. NSVT 2. PVCs  Secondary Discharge Diagnosis:  1. NICM w/ICD 2. Chronic CHF (systolic) 3. HTN 4. CKD  Allergies  Allergen Reactions  . Lisinopril Cough     Procedures This Admission: 1.  Electrophysiology study and radiofrequency catheter ablation on 06/10/18 by Dr Rayann Heman.   This study demonstrated: CONCLUSIONS: 1. Sinus rhythm upon presentation with occasional premature ventricular contractions observed 2. The clinical PVC was of a RBB superior axis.  This PVC was mapped to the inferolateral basal left ventricle.  Extensive ablation was performed in this location.  The patient had significant reduction but not elimination of the PVC focus.  He may have an epicardial PVC source. 3. No early apparent complications. There were no inducible arrhythmias following ablation and no early apparent complications.   Brief HPI: Stephen [name] is a 52 y.o. male with a past medical history as outlined above. Risks, benefits, and alternatives to ablation were reviewed with the patient who wished to proceed.   Hospital Course:  The patient was admitted and underwent EPS/RFCA with details as outlined above. He was monitored on telemetry overnight which demonstrated SR, intermittently with increased PVC frequency.  Groin site was without complication.  The patient reports starting of a gout flare R foot, otherwise feels well this morning, no CP, SOB, or groin discomfort, he was examined by Dr. Rayann Heman who considered the patient stable for discharge to home, to resume his home colchicine and f/u with his PMD if gout flare does not improve.  On arrival he  was hypokalemic, this was replaced and K+ wnl this AM.  I reviewed the patient with AHF PA, recommended doubling his K+ replacement, leaving his diuretics unchanged, he has staff messaged their scheduler to call the patient for BMET in their office next week.   Follow up will be arranged in 4 weeks.  Wound care and restrictions were reviewed with the patient prior to discharge.    Physical Exam: Vitals:   06/10/18 2000 06/11/18 0643 06/11/18 0703 06/11/18 0704  BP:  118/88 (!) 110/93 (!) 110/93  Pulse:   70   Resp: 19 15 (!) 22 (!) 22  Temp:   98 F (36.7 C)   TempSrc:   Oral   SpO2:  98% 97%   Weight:   239 lb 13.8 oz (108.8 kg)   Height:        GEN- The patient is well appearing, alert and oriented x 3 today.   HEENT: normocephalic, atraumatic; sclera clear, conjunctiva pink; hearing intact; oropharynx clear; neck supple, no JVP Lymph- no cervical lymphadenopathy Lungs- CTA b/l, normal work of breathing.  No wheezes, rales, rhonchi Heart- RRR, no murmurs, rubs or gallops, PMI not laterally displaced GI- soft, non-tender, non-distended, Extremities- no clubbing, cyanosis, or edema; DP/PT/radial pulses 2+ bilaterally, R groin without hematoma/bruit,  L foot/LE, no skin changes, no heat, slightly swollen at ankle. MS- no significant deformity or atrophy Skin- warm and dry, no rash or lesion Psych- euthymic mood, full affect Neuro- strength and sensation are intact   Labs:   Lab Results  Component Value Date   WBC 8.5 05/27/2018   HGB 16.1 05/27/2018   HCT 48.0 05/27/2018  MCV 88.6 05/27/2018   PLT 213 05/27/2018    Recent Labs  Lab 06/11/18 0235  NA 137  K 4.3  CL 98*  CO2 29  BUN 43*  CREATININE 1.62*  CALCIUM 8.9  GLUCOSE 116*    Discharge Medications:  Allergies as of 06/11/2018      Reactions   Lisinopril Cough      Medication List    TAKE these medications   acetaminophen 325 MG tablet Commonly known as:  TYLENOL Take 2 tablets (650 mg total) by  mouth every 6 (six) hours as needed. What changed:    how much to take  reasons to take this   albuterol 108 (90 Base) MCG/ACT inhaler Commonly known as:  PROVENTIL HFA;VENTOLIN HFA Inhale 2 puffs into the lungs every 4 (four) hours as needed for wheezing or shortness of breath.   amiodarone 200 MG tablet Commonly known as:  PACERONE Take 400 mg by mouth daily.   aspirin 81 MG EC tablet Take 1 tablet (81 mg total) by mouth daily.   atorvastatin 80 MG tablet Commonly known as:  LIPITOR Take 1 tablet (80 mg total) by mouth daily at 6 PM.   carvedilol 6.25 MG tablet Commonly known as:  COREG Take 1 tablet (6.25 mg total) by mouth 2 (two) times daily with a meal.   Chest Rub 4.8-1.2-2.6 % Oint Apply 1 application topically daily as needed (congestino).   clopidogrel 75 MG tablet Commonly known as:  PLAVIX Take 1 tablet (75 mg total) by mouth daily.   colchicine 0.6 MG tablet Take 0.6 mg by mouth daily as needed (gout flare).   diclofenac sodium 1 % Gel Commonly known as:  VOLTAREN Apply 2 g topically 4 (four) times daily as needed (pain).   docusate sodium 100 MG capsule Commonly known as:  COLACE Take 1 capsule (100 mg total) by mouth 2 (two) times daily as needed for mild constipation.   febuxostat 40 MG tablet Commonly known as:  ULORIC Take 1 tablet (40 mg total) by mouth daily.   fluticasone 50 MCG/ACT nasal spray Commonly known as:  FLONASE Place 2 sprays into both nostrils daily. What changed:    when to take this  reasons to take this   ICY HOT EX Apply 1 application topically daily as needed (pain).   magnesium citrate Soln Take 0.3 Bottles by mouth daily as needed for severe constipation.   metolazone 2.5 MG tablet Commonly known as:  ZAROXOLYN Take 2.5 mg by mouth 2 (two) times daily.   mexiletine 150 MG capsule Commonly known as:  MEXITIL Take 1 capsule (150 mg total) by mouth 2 (two) times daily.   multivitamin with minerals Tabs  tablet Take 1 tablet by mouth daily.   nitroGLYCERIN 0.4 MG SL tablet Commonly known as:  NITROSTAT Place 1 tablet (0.4 mg total) under the tongue every 5 (five) minutes as needed for chest pain.   potassium chloride SA 20 MEQ tablet Commonly known as:  K-DUR,KLOR-CON Take 2 tablets (40 mEq total) by mouth 2 (two) times daily. What changed:  how much to take   sacubitril-valsartan 49-51 MG Commonly known as:  ENTRESTO Take 1 tablet by mouth 2 (two) times daily.   SPIRIVA RESPIMAT 2.5 MCG/ACT Aers Generic drug:  Tiotropium Bromide Monohydrate Inhale 1 puff into the lungs daily.   spironolactone 25 MG tablet Commonly known as:  ALDACTONE Take 1 tablet (25 mg total) by mouth daily.   torsemide 20 MG tablet Commonly known  as:  DEMADEX Take 2 tablets (40 mg total) by mouth 2 (two) times daily.   traMADol 50 MG tablet Commonly known as:  ULTRAM Take 1 tablet (50 mg total) by mouth every 6 (six) hours as needed. What changed:  reasons to take this       Disposition:  Home  Discharge Instructions    Diet - low sodium heart healthy   Complete by:  As directed    Increase activity slowly   Complete by:  As directed      Follow-up Information    Thompson Grayer, MD Follow up on 07/09/2018.   Specialty:  Cardiology Why:  2:00PM Contact information: [address] [place] [address] [phone]        [facility] Follow up.   Specialty:  Cardiology Why:  you will be called by Dr. Clayborne Dana office to schedule follow lab for next week.  Please call to follow up if not called to schedule by Friday. Contact information: [address] [id number] [place] [phone]          Duration of Discharge Encounter: Greater than 30 minutes including physician time.  Venetia Night, PA-C 06/11/2018 9:43 AM

## 2018-06-10 NOTE — Anesthesia Postprocedure Evaluation (Signed)
Anesthesia Post Note  Patient: Stephen [name]  Procedure(s) Performed: Stephanie Coup ABLATION (N/A )     Patient location during evaluation: Cath Lab Anesthesia Type: General Level of consciousness: awake and alert Pain management: pain level controlled Vital Signs Assessment: post-procedure vital signs reviewed and stable Respiratory status: spontaneous breathing, nonlabored ventilation, respiratory function stable and patient connected to nasal cannula oxygen Cardiovascular status: blood pressure returned to baseline and stable Postop Assessment: no apparent nausea or vomiting Anesthetic complications: no    Last Vitals:  Vitals:   06/10/18 1300 06/10/18 1627  BP: (!) 87/71 104/69  Pulse: (!) 52 68  Resp: (!) 24 (!) 26  Temp: 36.5 C 36.7 C  SpO2: 100% 98%    Last Pain:  Vitals:   06/10/18 1627  TempSrc: Oral  PainSc:                  Uyen Eichholz COKER

## 2018-06-10 NOTE — Anesthesia Preprocedure Evaluation (Signed)
Anesthesia Evaluation  Patient identified by MRN, date of birth, ID band Patient awake    Reviewed: Allergy & Precautions, NPO status , Patient's Chart, lab work & pertinent test results  Airway Mallampati: II  TM Distance: >3 FB Neck ROM: Full    Dental  (+) Partial Upper, Teeth Intact, Dental Advisory Given   Pulmonary former smoker,    breath sounds clear to auscultation       Cardiovascular hypertension,  Rhythm:Regular Rate:Normal     Neuro/Psych    GI/Hepatic   Endo/Other    Renal/GU      Musculoskeletal   Abdominal   Peds  Hematology   Anesthesia Other Findings   Reproductive/Obstetrics                             Anesthesia Physical Anesthesia Plan  ASA: III  Anesthesia Plan: General   Post-op Pain Management:    Induction: Intravenous  PONV Risk Score and Plan: Ondansetron and Dexamethasone  Airway Management Planned: Oral ETT  Additional Equipment:   Intra-op Plan:   Post-operative Plan: Extubation in OR  Informed Consent: I have reviewed the patients History and Physical, chart, labs and discussed the procedure including the risks, benefits and alternatives for the proposed anesthesia with the patient or authorized representative who has indicated his/her understanding and acceptance.   Dental advisory given  Plan Discussed with: CRNA and Anesthesiologist  Anesthesia Plan Comments:         Anesthesia Quick Evaluation

## 2018-06-10 NOTE — Progress Notes (Signed)
Site Area:  RFV X3 RFA X 1 Site prior to removal: Level 0 Presure applied for:  35 minutes Manual:  yes Pt status during pull:  stable Post pull site:  Level 0 Post pull instructions given:  yes Post pull pulses present:  yes Dressing applied:  Gauze/tegaderm Bedrest begins @:  12:40 Comments: Removed by Adria Dill and Burlene Arnt

## 2018-06-11 ENCOUNTER — Encounter (HOSPITAL_COMMUNITY): Payer: Self-pay

## 2018-06-11 ENCOUNTER — Encounter (HOSPITAL_COMMUNITY): Payer: Self-pay | Admitting: Internal Medicine

## 2018-06-11 ENCOUNTER — Other Ambulatory Visit: Payer: Self-pay | Admitting: Cardiovascular Disease

## 2018-06-11 DIAGNOSIS — I519 Heart disease, unspecified: Secondary | ICD-10-CM | POA: Diagnosis not present

## 2018-06-11 DIAGNOSIS — I472 Ventricular tachycardia: Secondary | ICD-10-CM | POA: Diagnosis not present

## 2018-06-11 DIAGNOSIS — I493 Ventricular premature depolarization: Secondary | ICD-10-CM

## 2018-06-11 DIAGNOSIS — I5022 Chronic systolic (congestive) heart failure: Secondary | ICD-10-CM | POA: Diagnosis not present

## 2018-06-11 DIAGNOSIS — E876 Hypokalemia: Secondary | ICD-10-CM | POA: Diagnosis not present

## 2018-06-11 DIAGNOSIS — I130 Hypertensive heart and chronic kidney disease with heart failure and stage 1 through stage 4 chronic kidney disease, or unspecified chronic kidney disease: Secondary | ICD-10-CM | POA: Diagnosis not present

## 2018-06-11 LAB — BASIC METABOLIC PANEL
Anion gap: 10 (ref 5–15)
BUN: 43 mg/dL — ABNORMAL HIGH (ref 6–20)
CALCIUM: 8.9 mg/dL (ref 8.9–10.3)
CO2: 29 mmol/L (ref 22–32)
CREATININE: 1.62 mg/dL — AB (ref 0.61–1.24)
Chloride: 98 mmol/L — ABNORMAL LOW (ref 101–111)
GFR, EST AFRICAN AMERICAN: 54 mL/min — AB (ref >60)
GFR, EST NON AFRICAN AMERICAN: 47 mL/min — AB (ref >60)
Glucose, Bld: 116 mg/dL — ABNORMAL HIGH (ref 65–99)
Potassium: 4.3 mmol/L (ref 3.5–5.1)
Sodium: 137 mmol/L (ref 135–145)

## 2018-06-11 LAB — MAGNESIUM: MAGNESIUM: 2.6 mg/dL — AB (ref 1.7–2.4)

## 2018-06-11 MED ORDER — FEBUXOSTAT 40 MG PO TABS
40.0000 mg | ORAL_TABLET | Freq: Once | ORAL | Status: AC
  Administered 2018-06-11: 40 mg via ORAL
  Filled 2018-06-11: qty 1

## 2018-06-11 MED ORDER — POTASSIUM CHLORIDE CRYS ER 20 MEQ PO TBCR: 40.0000 meq | EXTENDED_RELEASE_TABLET | Freq: Two times a day (BID) | ORAL | 3 refills | Status: DC

## 2018-06-11 MED ORDER — COLCHICINE 0.6 MG PO TABS
0.6000 mg | ORAL_TABLET | Freq: Once | ORAL | Status: AC
  Administered 2018-06-11: 0.6 mg via ORAL
  Filled 2018-06-11: qty 1

## 2018-06-11 MED FILL — Heparin Sod (Porcine)-NaCl IV Soln 1000 Unit/500ML-0.9%: INTRAVENOUS | Qty: 1000 | Status: AC

## 2018-06-11 NOTE — Discharge Instructions (Signed)
Post procedure care instructions No driving until cleared from your in-place restiction. No lifting over 5 lbs for 1 week. No vigorous or sexual activity for 1 week.  Keep procedure site clean & dry. If you notice increased pain, swelling, bleeding or pus, call/return!  You may shower, but no soaking baths/hot tubs/pools for 1 week.

## 2018-06-11 NOTE — Telephone Encounter (Signed)
Refill Request.  

## 2018-06-11 NOTE — Progress Notes (Signed)
Doing well s/p ablation  PVC burden significantly reduced  Creatinine and K are better.  Will ask CHF team to guide home diuretic dosing.  DC to home today  Follow-up with me in 4 weeks  Thompson Grayer MD, [facility] 06/11/2018 9:41 AM

## 2018-06-12 ENCOUNTER — Other Ambulatory Visit
Admission: RE | Admit: 2018-06-12 | Discharge: 2018-06-12 | Disposition: A | Discharge status: Home / Self Care | Payer: Commercial Managed Care - PPO | Source: Ambulatory Visit | Attending: Specialist | Admitting: Specialist

## 2018-06-12 DIAGNOSIS — M25462 Effusion, left knee: Secondary | ICD-10-CM | POA: Diagnosis not present

## 2018-06-12 LAB — SYNOVIAL CELL COUNT + DIFF, W/ CRYSTALS
CRYSTALS FLUID: NONE SEEN
EOSINOPHILS-SYNOVIAL: 0 %
LYMPHOCYTES-SYNOVIAL FLD: 0 %
Monocyte-Macrophage-Synovial Fluid: 19 %
Neutrophil, Synovial: 81 %
WBC, SYNOVIAL: 75958 /mm3 — AB (ref 0–200)

## 2018-06-16 ENCOUNTER — Ambulatory Visit (INDEPENDENT_AMBULATORY_CARE_PROVIDER_SITE_OTHER): Payer: Commercial Managed Care - PPO | Admitting: Family Medicine

## 2018-06-16 ENCOUNTER — Encounter: Payer: Self-pay | Admitting: Family Medicine

## 2018-06-16 VITALS — BP 108/79 | HR 96 | Temp 98.3°F | Wt 242.1 lb

## 2018-06-16 DIAGNOSIS — I5022 Chronic systolic (congestive) heart failure: Secondary | ICD-10-CM

## 2018-06-16 DIAGNOSIS — I5023 Acute on chronic systolic (congestive) heart failure: Secondary | ICD-10-CM

## 2018-06-16 DIAGNOSIS — Z113 Encounter for screening for infections with a predominantly sexual mode of transmission: Secondary | ICD-10-CM

## 2018-06-16 DIAGNOSIS — K625 Hemorrhage of anus and rectum: Secondary | ICD-10-CM

## 2018-06-16 DIAGNOSIS — M1A9XX0 Chronic gout, unspecified, without tophus (tophi): Secondary | ICD-10-CM | POA: Diagnosis not present

## 2018-06-16 DIAGNOSIS — E782 Mixed hyperlipidemia: Secondary | ICD-10-CM

## 2018-06-16 LAB — CBC WITH DIFFERENTIAL/PLATELET
Hematocrit: 43.5 % (ref 37.5–51.0)
Hemoglobin: 14.7 g/dL (ref 13.0–17.7)
Lymphocytes Absolute: 2.5 10*3/uL (ref 0.7–3.1)
Lymphs: 31 %
MCH: 30.2 pg (ref 26.6–33.0)
MCHC: 33.8 g/dL (ref 31.5–35.7)
MCV: 89 fL (ref 79–97)
MID (Absolute): 1 10*3/uL (ref 0.1–1.6)
MID: 13 %
NEUTROS PCT: 56 %
Neutrophils Absolute: 4.7 10*3/uL (ref 1.4–7.0)
PLATELETS: 310 10*3/uL (ref 150–450)
RBC: 4.87 x10E6/uL (ref 4.14–5.80)
RDW: 14 % (ref 12.3–15.4)
WBC: 8.2 10*3/uL (ref 3.4–10.8)

## 2018-06-16 LAB — BODY FLUID CULTURE: Culture: NO GROWTH

## 2018-06-16 NOTE — Assessment & Plan Note (Signed)
Following with cardiology. Call with any concerns. Continue to monitor. Feeling much better. Checking BMP today.

## 2018-06-16 NOTE — Assessment & Plan Note (Signed)
Rechecking levels today. Await results. Call with any concerns.  

## 2018-06-16 NOTE — Assessment & Plan Note (Signed)
Rechecking levels today. Continue to monitor. Call with any concerns.  

## 2018-06-16 NOTE — Progress Notes (Signed)
BP 108/79 (BP Location: Right Arm, Patient Position: Sitting, Cuff Size: Normal)   Pulse 96   Temp 98.3 F (36.8 C)   Wt 242 lb 2 oz (109.8 kg)   SpO2 100%   BMI 31.51 kg/m    Subjective:    Patient ID: Stephen [name], male    DOB: Jan 18, 1966, 52 y.o.   MRN: [id number]  HPI: Stephen [name] is a 52 y.o. male  Chief Complaint  Patient presents with  . Gout  . Hyperlipidemia  . Rectal Bleeding   Just had a V-Tach ablation done with EP 6 days ago. Following with CHF clinic- Had had his entresto and torsemide increased- had been feeling much better. They were holding his amiodarone for the ablation. He states that he has been doing SO MUCH BETTER since his ablation. He is breathing better! He's feeling great! He states that he feels like a new man!  GOUT- doing well. No concerns.   HYPERLIPIDEMIA Hyperlipidemia status: excellent compliance Satisfied with current treatment?  yes Side effects:  no Medication compliance: excellent compliance Past cholesterol meds: atorvastatin 80 Supplements: none Aspirin:  yes The ASCVD Risk score Mikey Bussing DC Jr., et al., 2013) failed to calculate for the following reasons:   The patient has a prior MI or stroke diagnosis Chest pain:  no Coronary artery disease:  yes  Relevant past medical, surgical, family and social history reviewed and updated as indicated. Interim medical history since our last visit reviewed. Allergies and medications reviewed and updated.  Review of Systems  Constitutional: Negative.   Respiratory: Negative.   Cardiovascular: Negative.   Musculoskeletal: Positive for arthralgias. Negative for back pain, gait problem, joint swelling, myalgias, neck pain and neck stiffness.  Skin: Negative.   Psychiatric/Behavioral: Negative.     Per HPI unless specifically indicated above     Objective:    BP 108/79 (BP Location: Right Arm, Patient Position: Sitting, Cuff Size: Normal)   Pulse 96   Temp 98.3 F (36.8 C)    Wt 242 lb 2 oz (109.8 kg)   SpO2 100%   BMI 31.51 kg/m   Wt Readings from Last 3 Encounters:  06/16/18 242 lb 2 oz (109.8 kg)  06/11/18 239 lb 13.8 oz (108.8 kg)  05/30/18 237 lb (107.5 kg)    Physical Exam  Constitutional: He is oriented to person, place, and time. He appears well-developed and well-nourished. No distress.  HENT:  Head: Normocephalic and atraumatic.  Right Ear: Hearing normal.  Left Ear: Hearing normal.  Nose: Nose normal.  Eyes: Conjunctivae and lids are normal. Right eye exhibits no discharge. Left eye exhibits no discharge. No scleral icterus.  Cardiovascular: Normal rate, regular rhythm, normal heart sounds and intact distal pulses. Exam reveals no gallop and no friction rub.  No murmur heard. Pulmonary/Chest: Effort normal and breath sounds normal. No stridor. No respiratory distress. He has no wheezes. He has no rales. He exhibits no tenderness.  Musculoskeletal: Normal range of motion.  Neurological: He is alert and oriented to person, place, and time.  Skin: Skin is warm, dry and intact. Capillary refill takes less than 2 seconds. No rash noted. He is not diaphoretic. No erythema. No pallor.  Psychiatric: He has a normal mood and affect. His speech is normal and behavior is normal. Judgment and thought content normal. Cognition and memory are normal.    Results for orders placed or performed during the hospital encounter of 06/12/18  Body fluid culture  Result Value Ref Range  Specimen Description      KNEE Performed at [facility], [address] [place], [place] [address]    Special Requests      NONE Performed at [facility], [place], [place] [address]    Gram Stain      ABUNDANT WBC PRESENT, PREDOMINANTLY PMN NO ORGANISMS SEEN    Culture      NO GROWTH 3 DAYS Performed at [facility], [place] [address] [place], [place] [address]    Report Status PENDING   Synovial cell count + diff, w/ crystals    Result Value Ref Range   Color, Synovial YELLOW YELLOW   Appearance-Synovial TURBID (A) CLEAR   Crystals, Fluid NO CRYSTALS SEEN    WBC, Synovial 75,958 (H) 0 - 200 /cu mm   Neutrophil, Synovial 81 %   Lymphocytes-Synovial Fld 0 %   Monocyte-Macrophage-Synovial Fluid 19 %   Eosinophils-Synovial 0 %      Assessment & Plan:   Problem List Items Addressed This Visit      Cardiovascular and Mediastinum   Acute on chronic systolic CHF (congestive heart failure) (HCC)    Following with cardiology. Call with any concerns. Continue to monitor. Feeling much better. Checking BMP today.      Chronic systolic CHF (congestive heart failure) [facility])    Following with cardiology. Call with any concerns. Continue to monitor. Feeling much better. Checking BMP today.        Other   Hyperlipidemia    Rechecking levels today. Await results. Call with any concerns.       Relevant Orders   Hepatic function panel   Lipid Panel w/o Chol/HDL Ratio   Gout - Primary    Rechecking levels today. Continue to monitor. Call with any concerns.       Relevant Orders   Hepatic function panel   Uric acid    Other Visit Diagnoses    Acute on chronic systolic heart failure ([place])       Rectal bleeding       To see GI on 7/17. Will recheck CBC today. Await results.    Relevant Orders   CBC With Differential/Platelet   Routine screening for STI (sexually transmitted infection)       Would like labs drawn. Drawn today. Await results. Call with any concerns.    Relevant Orders   HIV antibody   RPR   GC/Chlamydia Probe Amp   HSV(herpes simplex vrs) 1+2 ab-IgG   Hepatitis, Acute       Follow up plan: Return in about 3 months (around 09/16/2018) for Physical.

## 2018-06-16 NOTE — Telephone Encounter (Signed)
Stephen [name],   Is this something that Dr. Haroldine Laws would still want this patient to have on hand? You all just saw him on 05/30/18.  Thanks!

## 2018-06-17 ENCOUNTER — Encounter: Payer: Self-pay | Admitting: Urology

## 2018-06-17 ENCOUNTER — Ambulatory Visit: Payer: Commercial Managed Care - PPO | Admitting: Urology

## 2018-06-17 ENCOUNTER — Telehealth: Payer: Self-pay | Admitting: Family Medicine

## 2018-06-17 ENCOUNTER — Telehealth: Payer: Self-pay | Admitting: Internal Medicine

## 2018-06-17 VITALS — BP 111/81 | HR 65 | Ht 73.5 in | Wt 244.4 lb

## 2018-06-17 DIAGNOSIS — N400 Enlarged prostate without lower urinary tract symptoms: Secondary | ICD-10-CM

## 2018-06-17 DIAGNOSIS — N5201 Erectile dysfunction due to arterial insufficiency: Secondary | ICD-10-CM

## 2018-06-17 LAB — HEPATITIS PANEL, ACUTE
HEP A IGM: NEGATIVE
HEP B C IGM: NEGATIVE
HEP B S AG: NEGATIVE

## 2018-06-17 LAB — RPR: RPR: NONREACTIVE

## 2018-06-17 LAB — LIPID PANEL W/O CHOL/HDL RATIO
CHOLESTEROL TOTAL: 181 mg/dL (ref 100–199)
HDL: 36 mg/dL — ABNORMAL LOW (ref >39)
LDL Calculated: 123 mg/dL — ABNORMAL HIGH (ref 0–99)
Triglycerides: 110 mg/dL (ref 0–149)
VLDL Cholesterol Cal: 22 mg/dL (ref 5–40)

## 2018-06-17 LAB — HEPATIC FUNCTION PANEL
ALT: 27 IU/L (ref 0–44)
AST: 20 IU/L (ref 0–40)
Albumin: 3.5 g/dL (ref 3.5–5.5)
Alkaline Phosphatase: 113 IU/L (ref 39–117)
BILIRUBIN TOTAL: 0.4 mg/dL (ref 0.0–1.2)
BILIRUBIN, DIRECT: 0.14 mg/dL (ref 0.00–0.40)
Total Protein: 6.9 g/dL (ref 6.0–8.5)

## 2018-06-17 LAB — URIC ACID: Uric Acid: 7.7 mg/dL (ref 3.7–8.6)

## 2018-06-17 LAB — HSV(HERPES SIMPLEX VRS) I + II AB-IGG: HSV 1 Glycoprotein G Ab, IgG: >62.2 index — ABNORMAL HIGH (ref 0.00–0.90)

## 2018-06-17 LAB — HIV ANTIBODY (ROUTINE TESTING W REFLEX): HIV SCREEN 4TH GENERATION: NONREACTIVE

## 2018-06-17 NOTE — Telephone Encounter (Signed)
Patient notified

## 2018-06-17 NOTE — Progress Notes (Signed)
06/17/2018 3:06 PM   Stephen [name] Clovia Cuff December 16, 1966 [id number]  Referring provider: Valerie Roys, DO [place], [place] [address]  Chief Complaint  Patient presents with  . Erectile Dysfunction    HPI: Stephen [name] is a 52 year old male seen in consultation at the request of Dr. Wynetta Emery for evaluation of erectile dysfunction.  He had cardiac arrest in December 2018 and underwent placement of an ICD in December 2018.  He has chronic systolic CHF and non-ischemic cardiomyopathy.  He takes sublingual nitroglycerin however states it is been 3 to 4 months since he last took.  Since his cardiac arrest he complains of decreased libido, tiredness and fatigue.  He has difficulty achieving and maintaining an erection.  He also wanted to have his prostate checked.  He has no bothersome lower urinary tract symptoms.  On chart review the last PSA I see is in January 2017 and was 0.74.   PMH: Past Medical History:  Diagnosis Date  . AICD (automatic cardioverter/defibrillator) present   . AKI (acute kidney injury) ([place]) 12/24/2017  . Arrhythmia   . Arthritis    "lower back, knees" (06/10/2018)  . Cardiac arrest ([place]) 12/23/2017   Brief V-fib arrest  . Chest pain 09/08/2017  . CHF (congestive heart failure) (HCC)    nonischemic cardiomyopathy, EF 25%  . Gout    "on daily RX" (06/10/2018)  . Headache    "q couple months" (06/10/2018)  . High cholesterol   . History of kidney stones   . Hypertension   . Influenza A 12/24/2017  . NICM (nonischemic cardiomyopathy) ([place])   . Pneumonia 12/20/2017  . TIA (transient ischemic attack) 06/21/2016   "still affects my memory a little bit" (06/10/2018)    Surgical History: Past Surgical History:  Procedure Laterality Date  . EXTERNAL FIXATION LEG Right ~ 2000   "was going bowlegged; had to brake my leg to fix it"  . HERNIA REPAIR    . ICD IMPLANT N/A 12/30/2017   Procedure: ICD IMPLANT;  Surgeon: Deboraha Sprang, MD;  Location: [facility];  Service: Cardiovascular;  Laterality: N/A;  . LEFT HEART CATH AND CORONARY ANGIOGRAPHY N/A 09/09/2017   Procedure: LEFT HEART CATH AND CORONARY ANGIOGRAPHY;  Surgeon: Teodoro Spray, MD;  Location: [place] [facility];  Service: Cardiovascular;  Laterality: N/A;  . UMBILICAL HERNIA REPAIR  1990s  . V TACH ABLATION  06/10/2018  . V TACH ABLATION N/A 06/10/2018   Procedure: V TACH ABLATION;  Surgeon: Thompson Grayer, MD;  Location: [facility] [facility];  Service: Cardiovascular;  Laterality: N/A;  . VASECTOMY      Home Medications:  Allergies as of 06/17/2018      Reactions   Lisinopril Cough      Medication List        Accurate as of 06/17/18  3:06 PM. Always use your most recent med list.          acetaminophen 325 MG tablet Commonly known as:  TYLENOL Take 2 tablets (650 mg total) by mouth every 6 (six) hours as needed.   albuterol 108 (90 Base) MCG/ACT inhaler Commonly known as:  PROVENTIL HFA;VENTOLIN HFA Inhale 2 puffs into the lungs every 4 (four) hours as needed for wheezing or shortness of breath.   amiodarone 200 MG tablet Commonly known as:  PACERONE Take 400 mg by mouth daily.   aspirin 81 MG EC tablet Take 1 tablet (81 mg total) by mouth daily.  atorvastatin 80 MG tablet Commonly known as:  LIPITOR Take 1 tablet (80 mg total) by mouth daily at 6 PM.   carvedilol 6.25 MG tablet Commonly known as:  COREG Take 1 tablet (6.25 mg total) by mouth 2 (two) times daily with a meal.   Chest Rub 4.8-1.2-2.6 % Oint Apply 1 application topically daily as needed (congestino).   clopidogrel 75 MG tablet Commonly known as:  PLAVIX Take 1 tablet (75 mg total) by mouth daily.   colchicine 0.6 MG tablet Take 0.6 mg by mouth daily as needed (gout flare).   diclofenac sodium 1 % Gel Commonly known as:  VOLTAREN Apply 2 g topically 4 (four) times daily as needed (pain).   docusate sodium 100 MG capsule Commonly known as:  COLACE Take 1 capsule (100 mg  total) by mouth 2 (two) times daily as needed for mild constipation.   febuxostat 40 MG tablet Commonly known as:  ULORIC Take 1 tablet (40 mg total) by mouth daily.   fluticasone 50 MCG/ACT nasal spray Commonly known as:  FLONASE Place 2 sprays into both nostrils daily.   ICY HOT EX Apply 1 application topically daily as needed (pain).   metolazone 2.5 MG tablet Commonly known as:  ZAROXOLYN Take 2.5 mg by mouth 2 (two) times daily.   mexiletine 150 MG capsule Commonly known as:  MEXITIL Take 1 capsule (150 mg total) by mouth 2 (two) times daily.   multivitamin with minerals Tabs tablet Take 1 tablet by mouth daily.   nitroGLYCERIN 0.4 MG SL tablet Commonly known as:  NITROSTAT Place 1 tablet (0.4 mg total) under the tongue every 5 (five) minutes as needed for chest pain.   potassium chloride SA 20 MEQ tablet Commonly known as:  K-DUR,KLOR-CON Take 2 tablets (40 mEq total) by mouth 2 (two) times daily.   sacubitril-valsartan 49-51 MG Commonly known as:  ENTRESTO Take 1 tablet by mouth 2 (two) times daily.   SPIRIVA RESPIMAT 2.5 MCG/ACT Aers Generic drug:  Tiotropium Bromide Monohydrate Inhale 1 puff into the lungs daily.   spironolactone 25 MG tablet Commonly known as:  ALDACTONE Take 1 tablet (25 mg total) by mouth daily.   torsemide 20 MG tablet Commonly known as:  DEMADEX Take 2 tablets (40 mg total) by mouth 2 (two) times daily.       Allergies:  Allergies  Allergen Reactions  . Lisinopril Cough    Family History: Family History  Problem Relation Age of Onset  . Hypertension Mother   . Heart failure Mother   . Hypertension Father   . CAD Father   . Heart attack Father     Social History:  reports that he quit smoking about 2 years ago. His smoking use included cigarettes. He has a 10.89 pack-year smoking history. He has never used smokeless tobacco. He reports that he drinks alcohol. He reports that he does not use  drugs.  ROS: UROLOGY Frequent Urination?: No Hard to postpone urination?: No Burning/pain with urination?: No Get up at night to urinate?: Yes Leakage of urine?: Yes Urine stream starts and stops?: No Trouble starting stream?: No Do you have to strain to urinate?: No Blood in urine?: No Urinary tract infection?: No Sexually transmitted disease?: No Injury to kidneys or bladder?: No Painful intercourse?: No Weak stream?: No Erection problems?: No Penile pain?: No  Gastrointestinal Nausea?: No Vomiting?: No Indigestion/heartburn?: No Diarrhea?: No Constipation?: No  Constitutional Fever: No Night sweats?: No Weight loss?: Yes Fatigue?: No  Skin  Skin rash/lesions?: No Itching?: No  Eyes Blurred vision?: No Double vision?: No  Ears/Nose/Throat Sore throat?: No Sinus problems?: Yes  Hematologic/Lymphatic Swollen glands?: No Easy bruising?: No  Cardiovascular Leg swelling?: No Chest pain?: No  Respiratory Cough?: No Shortness of breath?: No  Endocrine Excessive thirst?: No  Musculoskeletal Back pain?: Yes Joint pain?: Yes  Neurological Headaches?: No Dizziness?: No  Psychologic Depression?: No Anxiety?: No  Physical Exam: BP 111/81 (BP Location: Left Arm, Patient Position: Sitting, Cuff Size: Normal)   Pulse 65   Ht 6' 1.5" (1.867 m)   Wt 244 lb 6.4 oz (110.9 kg)   SpO2 99%   BMI 31.81 kg/m   Constitutional:  Alert and oriented, No acute distress. HEENT: [place] AT, moist mucus membranes.  Trachea midline, no masses. Cardiovascular: No clubbing, cyanosis, or edema. Respiratory: Normal respiratory effort, no increased work of breathing. GI: Abdomen is soft, nontender, nondistended, no abdominal masses GU: No CVA tenderness.  Penis circumcised without lesions, testes descended bilaterally without masses or tenderness, no paratesticular abnormalities.  Prostate 35 g, smooth without nodules. Lymph: No cervical or inguinal lymphadenopathy. Skin:  No rashes, bruises or suspicious lesions. Neurologic: Grossly intact, no focal deficits, moving all 4 extremities. Psychiatric: Normal mood and affect.    Assessment & Plan:   52 year old male with significant cardiac history and erectile dysfunction with decreased libido.  Will check an a.m. testosterone level, LH.  A PSA will also be checked after discussing the pros and cons of PSA screening.  He states he has an appointment in cardiology next week and would recommend their approval for use of PDE 5 inhibitors.  He was also informed recent studies suggesting an increased cardiac risk on patients on testosterone replacement therapy and although controversial would also recommend cardiology input.  Abbie Sons, [place] [address] [place], [place] [address] [phone]

## 2018-06-17 NOTE — Telephone Encounter (Signed)
Please let him know that his labs came back nice and normal. His cholesterol is better, but not perfect- we'll check it next time when he's fasting. His uric acid is normal. He has been exposed to the cold sore virus, but the rest of his labs are normal. We should be getting his GC/Chlamydia back tomorrow. Thanks!

## 2018-06-17 NOTE — Telephone Encounter (Signed)
New message   Patient's sister calling to report bruising and "lump" in groin area.

## 2018-06-17 NOTE — Telephone Encounter (Signed)
Returned call to Pt's sister (ok per DPR). Pt with small knot where catheter was inserted for ablation.  Advised that this was normal post ablation.  Continue to monitor to ensure knot does not get larger, increased bruising, increased pain, or any s/s of infection.    Sister indicates understanding.  Thanked nurse for call.

## 2018-06-18 ENCOUNTER — Encounter: Payer: Self-pay | Admitting: Urology

## 2018-06-18 LAB — GC/CHLAMYDIA PROBE AMP
Chlamydia trachomatis, NAA: NEGATIVE
NEISSERIA GONORRHOEAE BY PCR: NEGATIVE

## 2018-06-18 LAB — SPECIMEN STATUS REPORT

## 2018-06-19 ENCOUNTER — Telehealth: Payer: Self-pay | Admitting: Family Medicine

## 2018-06-19 NOTE — Telephone Encounter (Signed)
Patient notified

## 2018-06-19 NOTE — Telephone Encounter (Signed)
Please let him know that his GC/chlamydia were negative

## 2018-06-23 ENCOUNTER — Ambulatory Visit (INDEPENDENT_AMBULATORY_CARE_PROVIDER_SITE_OTHER): Payer: Commercial Managed Care - PPO | Admitting: Family Medicine

## 2018-06-23 ENCOUNTER — Encounter (HOSPITAL_COMMUNITY): Payer: Self-pay | Admitting: *Deleted

## 2018-06-23 ENCOUNTER — Encounter: Payer: Self-pay | Admitting: Family Medicine

## 2018-06-23 ENCOUNTER — Ambulatory Visit (HOSPITAL_COMMUNITY): Payer: Commercial Managed Care - PPO | Attending: Internal Medicine

## 2018-06-23 VITALS — BP 98/68 | HR 77 | Temp 97.6°F | Wt 237.6 lb

## 2018-06-23 DIAGNOSIS — K644 Residual hemorrhoidal skin tags: Secondary | ICD-10-CM | POA: Diagnosis not present

## 2018-06-23 MED ORDER — HYDROCORTISONE ACETATE 25 MG RE SUPP: 25.0000 mg | Freq: Two times a day (BID) | RECTAL | 3 refills | Status: DC

## 2018-06-23 NOTE — Patient Instructions (Signed)
Hemorrhoids Hemorrhoids are swollen veins in and around the rectum or anus. There are two types of hemorrhoids:  Internal hemorrhoids. These occur in the veins that are just inside the rectum. They may poke through to the outside and become irritated and painful.  External hemorrhoids. These occur in the veins that are outside of the anus and can be felt as a painful swelling or hard lump near the anus.  Most hemorrhoids do not cause serious problems, and they can be managed with home treatments such as diet and lifestyle changes. If home treatments do not help your symptoms, procedures can be done to shrink or remove the hemorrhoids. What are the causes? This condition is caused by increased pressure in the anal area. This pressure may result from various things, including:  Constipation.  Straining to have a bowel movement.  Diarrhea.  Pregnancy.  Obesity.  Sitting for long periods of time.  Heavy lifting or other activity that causes you to strain.  Anal sex.  What are the signs or symptoms? Symptoms of this condition include:  Pain.  Anal itching or irritation.  Rectal bleeding.  Leakage of stool (feces).  Anal swelling.  One or more lumps around the anus.  How is this diagnosed? This condition can often be diagnosed through a visual exam. Other exams or tests may also be done, such as:  Examination of the rectal area with a gloved hand (digital rectal exam).  Examination of the anal canal using a small tube (anoscope).  A blood test, if you have lost a significant amount of blood.  A test to look inside the colon (sigmoidoscopy or colonoscopy).  How is this treated? This condition can usually be treated at home. However, various procedures may be done if dietary changes, lifestyle changes, and other home treatments do not help your symptoms. These procedures can help make the hemorrhoids smaller or remove them completely. Some of these procedures involve  surgery, and others do not. Common procedures include:  Rubber band ligation. Rubber bands are placed at the base of the hemorrhoids to cut off the blood supply to them.  Sclerotherapy. Medicine is injected into the hemorrhoids to shrink them.  Infrared coagulation. A type of light energy is used to get rid of the hemorrhoids.  Hemorrhoidectomy surgery. The hemorrhoids are surgically removed, and the veins that supply them are tied off.  Stapled hemorrhoidopexy surgery. A circular stapling device is used to remove the hemorrhoids and use staples to cut off the blood supply to them.  Follow these instructions at home: Eating and drinking  Eat foods that have a lot of fiber in them, such as whole grains, beans, nuts, fruits, and vegetables. Ask your health care provider about taking products that have added fiber (fiber supplements).  Drink enough fluid to keep your urine clear or pale yellow. Managing pain and swelling  Take warm sitz baths for 20 minutes, 3-4 times a day to ease pain and discomfort.  If directed, apply ice to the affected area. Using ice packs between sitz baths may be helpful. ? Put ice in a plastic bag. ? Place a towel between your skin and the bag. ? Leave the ice on for 20 minutes, 2-3 times a day. General instructions  Take over-the-counter and prescription medicines only as told by your health care provider.  Use medicated creams or suppositories as told.  Exercise regularly.  Go to the bathroom when you have the urge to have a bowel movement. Do not wait.    Avoid straining to have bowel movements.  Keep the anal area dry and clean. Use wet toilet paper or moist towelettes after a bowel movement.  Do not sit on the toilet for long periods of time. This increases blood pooling and pain. Contact a health care provider if:  You have increasing pain and swelling that are not controlled by treatment or medicine.  You have uncontrolled bleeding.  You  have difficulty having a bowel movement, or you are unable to have a bowel movement.  You have pain or inflammation outside the area of the hemorrhoids. This information is not intended to replace advice given to you by your health care provider. Make sure you discuss any questions you have with your health care provider. Document Released: 12/14/2000 Document Revised: 05/16/2016 Document Reviewed: 08/31/2015 Elsevier Interactive Patient Education  2018 [employer].  How to Take a [employer] A sitz bath is a warm water bath that is taken while you are sitting down. The water should only come up to your hips and should cover your buttocks. Your health care provider may recommend a sitz bath to help you:  Clean the lower part of your body, including your genital area.  With itching.  With pain.  With sore muscles or muscles that tighten or spasm.  How to take a sitz bath Take 3-4 sitz baths per day or as told by your health care provider. 1. Partially fill a bathtub with warm water. You will only need the water to be deep enough to cover your hips and buttocks when you are sitting in it. 2. If your health care provider told you to put medicine in the water, follow the directions exactly. 3. Sit in the water and open the tub drain a little. 4. Turn on the warm water again to keep the tub at the correct level. Keep the water running constantly. 5. Soak in the water for 15-20 minutes or as told by your health care provider. 6. After the sitz bath, pat the affected area dry first. Do not rub it. 7. Be careful when you stand up after the sitz bath because you may feel dizzy.  Contact a health care provider if:  Your symptoms get worse. Do not continue with sitz baths if your symptoms get worse.  You have new symptoms. Do not continue with sitz baths until you talk with your health care provider. This information is not intended to replace advice given to you by your health care provider.  Make sure you discuss any questions you have with your health care provider. Document Released: 09/08/2004 Document Revised: 05/16/2016 Document Reviewed: 12/15/2014 Elsevier Interactive Patient Education  [employer].

## 2018-06-23 NOTE — Progress Notes (Signed)
CPX not completed/performed due to frequency of PVCs. Discussed with Dr. Haroldine Laws and Dr. Rayann Heman for follow up in EP and with [facility] before rescheduling CPX. All appointments currently scheduled and all agreeable with plan.     Landis Martins, MS, ACSM-RCEP Clinical Exercise Physiologist

## 2018-06-23 NOTE — Progress Notes (Signed)
BP 98/68 (BP Location: Left Arm, Patient Position: Sitting, Cuff Size: Normal)   Pulse 77   Temp 97.6 F (36.4 C)   Wt 237 lb 9 oz (107.8 kg)   SpO2 98%   BMI 30.92 kg/m    Subjective:    Patient ID: Stephen [name], male    DOB: 1966-03-10, 52 y.o.   MRN: [id number]  HPI: Stephen [name] is a 52 y.o. male  Chief Complaint  Patient presents with  . Hemorrhoids   HEMORRHOIDS Duration: 1.5 weeks Anal fullness: yes Perianal itching/irritation: yes Perianal pain: no Bright red rectal bleeding: yes Amount of blood: spotting Frequency: intermittent Constipation: no Hard stools: no Chronic straining/valsava: yes Status: better Treatments attempted: nothing  Previous hemorrhoids: no  Colonoscopy: yes- back in the 1990s  Relevant past medical, surgical, family and social history reviewed and updated as indicated. Interim medical history since our last visit reviewed. Allergies and medications reviewed and updated.  Review of Systems  Constitutional: Negative.   Respiratory: Negative.   Cardiovascular: Negative.   Psychiatric/Behavioral: Negative.     Per HPI unless specifically indicated above     Objective:    BP 98/68 (BP Location: Left Arm, Patient Position: Sitting, Cuff Size: Normal)   Pulse 77   Temp 97.6 F (36.4 C)   Wt 237 lb 9 oz (107.8 kg)   SpO2 98%   BMI 30.92 kg/m   Wt Readings from Last 3 Encounters:  06/23/18 237 lb 9 oz (107.8 kg)  06/17/18 244 lb 6.4 oz (110.9 kg)  06/16/18 242 lb 2 oz (109.8 kg)    Physical Exam  Constitutional: He is oriented to person, place, and time. He appears well-developed and well-nourished. No distress.  HENT:  Head: Normocephalic and atraumatic.  Right Ear: Hearing normal.  Left Ear: Hearing normal.  Nose: Nose normal.  Eyes: Conjunctivae and lids are normal. Right eye exhibits no discharge. Left eye exhibits no discharge. No scleral icterus.  Cardiovascular: Normal rate, regular rhythm, normal heart  sounds and intact distal pulses. Exam reveals no gallop and no friction rub.  No murmur heard. Pulmonary/Chest: Effort normal and breath sounds normal. No stridor. No respiratory distress. He has no wheezes. He has no rales. He exhibits no tenderness.  Genitourinary: Rectal exam shows external hemorrhoid.  Musculoskeletal: Normal range of motion.  Neurological: He is alert and oriented to person, place, and time.  Skin: Skin is warm, dry and intact. Capillary refill takes less than 2 seconds. No rash noted. He is not diaphoretic. No erythema. No pallor.  Psychiatric: He has a normal mood and affect. His speech is normal and behavior is normal. Judgment and thought content normal. Cognition and memory are normal.  Nursing note and vitals reviewed.   Results for orders placed or performed in visit on 06/16/18  GC/Chlamydia Probe Amp  Result Value Ref Range   Chlamydia trachomatis, NAA Negative Negative   Neisseria gonorrhoeae by PCR Negative Negative  CBC With Differential/Platelet  Result Value Ref Range   WBC 8.2 3.4 - 10.8 x10E3/uL   RBC 4.87 4.14 - 5.80 x10E6/uL   Hemoglobin 14.7 13.0 - 17.7 g/dL   Hematocrit 43.5 37.5 - 51.0 %   MCV 89 79 - 97 fL   MCH 30.2 26.6 - 33.0 pg   MCHC 33.8 31.5 - 35.7 g/dL   RDW 14.0 12.3 - 15.4 %   Platelets 310 150 - 450 x10E3/uL   Neutrophils 56 Not Estab. %   Lymphs 31 Not  Estab. %   MID 13 Not Estab. %   Neutrophils Absolute 4.7 1.4 - 7.0 x10E3/uL   Lymphocytes Absolute 2.5 0.7 - 3.1 x10E3/uL   MID (Absolute) 1.0 0.1 - 1.6 X10E3/uL  Hepatic function panel  Result Value Ref Range   Total Protein 6.9 6.0 - 8.5 g/dL   Albumin 3.5 3.5 - 5.5 g/dL   Bilirubin Total 0.4 0.0 - 1.2 mg/dL   Bilirubin, Direct 0.14 0.00 - 0.40 mg/dL   Alkaline Phosphatase 113 39 - 117 IU/L   AST 20 0 - 40 IU/L   ALT 27 0 - 44 IU/L  Lipid Panel w/o Chol/HDL Ratio  Result Value Ref Range   Cholesterol, Total 181 100 - 199 mg/dL   Triglycerides 110 0 - 149 mg/dL    HDL 36 (L) >39 mg/dL   VLDL Cholesterol Cal 22 5 - 40 mg/dL   LDL Calculated 123 (H) 0 - 99 mg/dL  Uric acid  Result Value Ref Range   Uric Acid 7.7 3.7 - 8.6 mg/dL  HIV antibody  Result Value Ref Range   HIV Screen 4th Generation wRfx Non Reactive Non Reactive  RPR  Result Value Ref Range   RPR Ser Ql Non Reactive Non Reactive  HSV(herpes simplex vrs) 1+2 ab-IgG  Result Value Ref Range   HSV 1 Glycoprotein G Ab, IgG >62.20 (H) 0.00 - 0.90 index   HSV 2 IgG, Type Spec <0.91 0.00 - 0.90 index  Hepatitis, Acute  Result Value Ref Range   Hep A IgM Negative Negative   Hepatitis B Surface Ag Negative Negative   Hep B C IgM Negative Negative   Hep C Virus Ab <0.1 0.0 - 0.9 s/co ratio  Specimen status report  Result Value Ref Range   specimen status report Comment       Assessment & Plan:   Problem List Items Addressed This Visit    None    Visit Diagnoses    External hemorrhoid    -  Primary   Will treat with anu-sol. Call with any concerns. Seeing GI for evaluation of rectal bleeding/melena in 3 weeks.        Follow up plan: Return As scheduled.

## 2018-06-25 ENCOUNTER — Other Ambulatory Visit: Payer: Self-pay | Admitting: Family Medicine

## 2018-06-25 DIAGNOSIS — N5201 Erectile dysfunction due to arterial insufficiency: Secondary | ICD-10-CM

## 2018-06-25 DIAGNOSIS — N400 Enlarged prostate without lower urinary tract symptoms: Secondary | ICD-10-CM

## 2018-06-26 ENCOUNTER — Other Ambulatory Visit: Payer: Commercial Managed Care - PPO

## 2018-06-26 DIAGNOSIS — N400 Enlarged prostate without lower urinary tract symptoms: Secondary | ICD-10-CM

## 2018-06-26 DIAGNOSIS — N5201 Erectile dysfunction due to arterial insufficiency: Secondary | ICD-10-CM

## 2018-06-27 LAB — TESTOSTERONE: TESTOSTERONE: 402 ng/dL (ref 264–916)

## 2018-06-27 LAB — FSH/LH
FSH: 3.8 m[IU]/mL (ref 1.5–12.4)
LH: 5.1 m[IU]/mL (ref 1.7–8.6)

## 2018-06-27 LAB — PSA: Prostate Specific Ag, Serum: 0.9 ng/mL (ref 0.0–4.0)

## 2018-06-30 ENCOUNTER — Other Ambulatory Visit: Payer: Self-pay

## 2018-06-30 ENCOUNTER — Observation Stay (HOSPITAL_BASED_OUTPATIENT_CLINIC_OR_DEPARTMENT_OTHER)
Admit: 2018-06-30 | Discharge: 2018-06-30 | Disposition: A | Discharge status: Home / Self Care | Payer: Commercial Managed Care - PPO | Attending: Physician Assistant | Admitting: Physician Assistant

## 2018-06-30 ENCOUNTER — Encounter: Payer: Self-pay | Admitting: Internal Medicine

## 2018-06-30 ENCOUNTER — Emergency Department: Payer: Commercial Managed Care - PPO

## 2018-06-30 ENCOUNTER — Observation Stay
Admission: EM | Admit: 2018-06-30 | Discharge: 2018-07-06 | Disposition: A | Discharge status: Home / Self Care | Payer: Commercial Managed Care - PPO | Attending: Internal Medicine | Admitting: Internal Medicine

## 2018-06-30 ENCOUNTER — Telehealth: Payer: Self-pay

## 2018-06-30 DIAGNOSIS — Z7902 Long term (current) use of antithrombotics/antiplatelets: Secondary | ICD-10-CM | POA: Diagnosis not present

## 2018-06-30 DIAGNOSIS — I4901 Ventricular fibrillation: Principal | ICD-10-CM | POA: Insufficient documentation

## 2018-06-30 DIAGNOSIS — Z95810 Presence of automatic (implantable) cardiac defibrillator: Secondary | ICD-10-CM | POA: Insufficient documentation

## 2018-06-30 DIAGNOSIS — I472 Ventricular tachycardia: Secondary | ICD-10-CM

## 2018-06-30 DIAGNOSIS — Z8674 Personal history of sudden cardiac arrest: Secondary | ICD-10-CM | POA: Insufficient documentation

## 2018-06-30 DIAGNOSIS — I5022 Chronic systolic (congestive) heart failure: Secondary | ICD-10-CM | POA: Diagnosis not present

## 2018-06-30 DIAGNOSIS — J441 Chronic obstructive pulmonary disease with (acute) exacerbation: Secondary | ICD-10-CM | POA: Diagnosis not present

## 2018-06-30 DIAGNOSIS — I959 Hypotension, unspecified: Secondary | ICD-10-CM | POA: Insufficient documentation

## 2018-06-30 DIAGNOSIS — E7800 Pure hypercholesterolemia, unspecified: Secondary | ICD-10-CM | POA: Diagnosis not present

## 2018-06-30 DIAGNOSIS — Z7982 Long term (current) use of aspirin: Secondary | ICD-10-CM | POA: Diagnosis not present

## 2018-06-30 DIAGNOSIS — R112 Nausea with vomiting, unspecified: Secondary | ICD-10-CM | POA: Insufficient documentation

## 2018-06-30 DIAGNOSIS — R55 Syncope and collapse: Secondary | ICD-10-CM | POA: Diagnosis present

## 2018-06-30 DIAGNOSIS — Z7951 Long term (current) use of inhaled steroids: Secondary | ICD-10-CM | POA: Insufficient documentation

## 2018-06-30 DIAGNOSIS — N183 Chronic kidney disease, stage 3 (moderate): Secondary | ICD-10-CM | POA: Diagnosis not present

## 2018-06-30 DIAGNOSIS — N179 Acute kidney failure, unspecified: Secondary | ICD-10-CM | POA: Insufficient documentation

## 2018-06-30 DIAGNOSIS — M109 Gout, unspecified: Secondary | ICD-10-CM | POA: Diagnosis not present

## 2018-06-30 DIAGNOSIS — Z79899 Other long term (current) drug therapy: Secondary | ICD-10-CM | POA: Diagnosis not present

## 2018-06-30 DIAGNOSIS — I130 Hypertensive heart and chronic kidney disease with heart failure and stage 1 through stage 4 chronic kidney disease, or unspecified chronic kidney disease: Secondary | ICD-10-CM | POA: Insufficient documentation

## 2018-06-30 DIAGNOSIS — R7989 Other specified abnormal findings of blood chemistry: Secondary | ICD-10-CM | POA: Diagnosis not present

## 2018-06-30 DIAGNOSIS — Z8673 Personal history of transient ischemic attack (TIA), and cerebral infarction without residual deficits: Secondary | ICD-10-CM | POA: Diagnosis not present

## 2018-06-30 DIAGNOSIS — I340 Nonrheumatic mitral (valve) insufficiency: Secondary | ICD-10-CM | POA: Diagnosis not present

## 2018-06-30 DIAGNOSIS — I5023 Acute on chronic systolic (congestive) heart failure: Secondary | ICD-10-CM

## 2018-06-30 DIAGNOSIS — I428 Other cardiomyopathies: Secondary | ICD-10-CM | POA: Diagnosis not present

## 2018-06-30 DIAGNOSIS — I493 Ventricular premature depolarization: Secondary | ICD-10-CM | POA: Diagnosis not present

## 2018-06-30 DIAGNOSIS — Z87891 Personal history of nicotine dependence: Secondary | ICD-10-CM | POA: Insufficient documentation

## 2018-06-30 LAB — CBC
HCT: 48.2 % (ref 40.0–52.0)
Hemoglobin: 16.1 g/dL (ref 13.0–18.0)
MCH: 29.3 pg (ref 26.0–34.0)
MCHC: 33.5 g/dL (ref 32.0–36.0)
MCV: 87.4 fL (ref 80.0–100.0)
PLATELETS: 249 10*3/uL (ref 150–440)
RBC: 5.51 MIL/uL (ref 4.40–5.90)
RDW: 14.2 % (ref 11.5–14.5)
WBC: 9.6 10*3/uL (ref 3.8–10.6)

## 2018-06-30 LAB — BRAIN NATRIURETIC PEPTIDE: B NATRIURETIC PEPTIDE 5: 549 pg/mL — AB (ref 0.0–100.0)

## 2018-06-30 LAB — BASIC METABOLIC PANEL
Anion gap: 14 (ref 5–15)
BUN: 31 mg/dL — ABNORMAL HIGH (ref 6–20)
CALCIUM: 9.3 mg/dL (ref 8.9–10.3)
CHLORIDE: 101 mmol/L (ref 98–111)
CO2: 24 mmol/L (ref 22–32)
CREATININE: 1.88 mg/dL — AB (ref 0.61–1.24)
GFR calc non Af Amer: 39 mL/min — ABNORMAL LOW (ref >60)
GFR, EST AFRICAN AMERICAN: 45 mL/min — AB (ref >60)
Glucose, Bld: 153 mg/dL — ABNORMAL HIGH (ref 70–99)
Potassium: 3.6 mmol/L (ref 3.5–5.1)
SODIUM: 139 mmol/L (ref 135–145)

## 2018-06-30 LAB — MAGNESIUM: Magnesium: 1.9 mg/dL (ref 1.7–2.4)

## 2018-06-30 LAB — TROPONIN I
Troponin I: 0.08 ng/mL (ref <0.03)
Troponin I: 0.1 ng/mL (ref <0.03)

## 2018-06-30 LAB — GLUCOSE, CAPILLARY: GLUCOSE-CAPILLARY: 154 mg/dL — AB (ref 70–99)

## 2018-06-30 LAB — TSH: TSH: 0.666 u[IU]/mL (ref 0.350–4.500)

## 2018-06-30 MED ORDER — CLOPIDOGREL BISULFATE 75 MG PO TABS
75.0000 mg | ORAL_TABLET | Freq: Every day | ORAL | Status: DC
  Administered 2018-07-01 – 2018-07-06 (×6): 75 mg via ORAL
  Filled 2018-06-30 (×6): qty 1

## 2018-06-30 MED ORDER — ONDANSETRON HCL 4 MG/2ML IJ SOLN
4.0000 mg | Freq: Four times a day (QID) | INTRAMUSCULAR | Status: DC | PRN
  Administered 2018-07-02 – 2018-07-04 (×3): 4 mg via INTRAVENOUS
  Filled 2018-06-30 (×3): qty 2

## 2018-06-30 MED ORDER — ACETAMINOPHEN 325 MG PO TABS: 650.0000 mg | ORAL_TABLET | Freq: Four times a day (QID) | ORAL | Status: DC | PRN

## 2018-06-30 MED ORDER — ATORVASTATIN CALCIUM 20 MG PO TABS
80.0000 mg | ORAL_TABLET | Freq: Every day | ORAL | Status: DC
  Administered 2018-06-30 – 2018-07-05 (×6): 80 mg via ORAL
  Filled 2018-06-30 (×7): qty 4

## 2018-06-30 MED ORDER — POTASSIUM CHLORIDE CRYS ER 20 MEQ PO TBCR
20.0000 meq | EXTENDED_RELEASE_TABLET | Freq: Once | ORAL | Status: DC
  Filled 2018-06-30: qty 1

## 2018-06-30 MED ORDER — ASPIRIN EC 81 MG PO TBEC
81.0000 mg | DELAYED_RELEASE_TABLET | Freq: Every day | ORAL | Status: DC
  Administered 2018-07-01 – 2018-07-06 (×6): 81 mg via ORAL
  Filled 2018-06-30 (×6): qty 1

## 2018-06-30 MED ORDER — ADULT MULTIVITAMIN W/MINERALS CH
1.0000 | ORAL_TABLET | Freq: Every day | ORAL | Status: DC
  Administered 2018-07-01 – 2018-07-06 (×6): 1 via ORAL
  Filled 2018-06-30 (×6): qty 1

## 2018-06-30 MED ORDER — ENOXAPARIN SODIUM 40 MG/0.4ML ~~LOC~~ SOLN
40.0000 mg | SUBCUTANEOUS | Status: DC
  Administered 2018-06-30 – 2018-07-05 (×6): 40 mg via SUBCUTANEOUS
  Filled 2018-06-30 (×6): qty 0.4

## 2018-06-30 MED ORDER — TORSEMIDE 20 MG PO TABS
40.0000 mg | ORAL_TABLET | Freq: Two times a day (BID) | ORAL | Status: DC
  Administered 2018-06-30 – 2018-07-03 (×5): 40 mg via ORAL
  Filled 2018-06-30 (×6): qty 2

## 2018-06-30 MED ORDER — MEXILETINE HCL 200 MG PO CAPS
200.0000 mg | ORAL_CAPSULE | Freq: Two times a day (BID) | ORAL | Status: DC
  Administered 2018-06-30 – 2018-07-01 (×3): 200 mg via ORAL
  Filled 2018-06-30 (×4): qty 1

## 2018-06-30 MED ORDER — COLCHICINE 0.6 MG PO TABS: 0.6000 mg | ORAL_TABLET | Freq: Every day | ORAL | Status: DC | PRN

## 2018-06-30 MED ORDER — POTASSIUM CHLORIDE CRYS ER 20 MEQ PO TBCR
40.0000 meq | EXTENDED_RELEASE_TABLET | Freq: Two times a day (BID) | ORAL | Status: DC
  Administered 2018-06-30 – 2018-07-06 (×10): 40 meq via ORAL
  Filled 2018-06-30 (×11): qty 2

## 2018-06-30 MED ORDER — ONDANSETRON HCL 4 MG PO TABS: 4.0000 mg | ORAL_TABLET | Freq: Four times a day (QID) | ORAL | Status: DC | PRN

## 2018-06-30 MED ORDER — SPIRONOLACTONE 25 MG PO TABS
25.0000 mg | ORAL_TABLET | Freq: Every day | ORAL | Status: DC
  Filled 2018-06-30: qty 1

## 2018-06-30 MED ORDER — SACUBITRIL-VALSARTAN 49-51 MG PO TABS
1.0000 | ORAL_TABLET | Freq: Two times a day (BID) | ORAL | Status: DC
  Administered 2018-06-30 – 2018-07-01 (×2): 1 via ORAL
  Filled 2018-06-30 (×5): qty 1

## 2018-06-30 MED ORDER — METOLAZONE 2.5 MG PO TABS
2.5000 mg | ORAL_TABLET | Freq: Two times a day (BID) | ORAL | Status: DC
  Administered 2018-06-30 – 2018-07-03 (×4): 2.5 mg via ORAL
  Filled 2018-06-30 (×8): qty 1

## 2018-06-30 MED ORDER — AMIODARONE HCL 200 MG PO TABS
400.0000 mg | ORAL_TABLET | Freq: Every day | ORAL | Status: DC
  Administered 2018-07-01 – 2018-07-06 (×6): 400 mg via ORAL
  Filled 2018-06-30 (×6): qty 2

## 2018-06-30 MED ORDER — TIOTROPIUM BROMIDE MONOHYDRATE 18 MCG IN CAPS
1.0000 | ORAL_CAPSULE | Freq: Every day | RESPIRATORY_TRACT | Status: DC
  Administered 2018-07-01 – 2018-07-05 (×5): 18 ug via RESPIRATORY_TRACT
  Filled 2018-06-30: qty 5

## 2018-06-30 MED ORDER — CARVEDILOL 6.25 MG PO TABS
6.2500 mg | ORAL_TABLET | Freq: Two times a day (BID) | ORAL | Status: DC
  Administered 2018-06-30: 6.25 mg via ORAL
  Filled 2018-06-30: qty 1

## 2018-06-30 MED ORDER — FEBUXOSTAT 40 MG PO TABS
40.0000 mg | ORAL_TABLET | Freq: Every day | ORAL | Status: DC
  Administered 2018-07-01 – 2018-07-06 (×6): 40 mg via ORAL
  Filled 2018-06-30 (×6): qty 1

## 2018-06-30 MED ORDER — POTASSIUM CHLORIDE 10 MEQ/100ML IV SOLN
10.0000 meq | Freq: Once | INTRAVENOUS | Status: AC
  Administered 2018-06-30: 10 meq via INTRAVENOUS
  Filled 2018-06-30: qty 100

## 2018-06-30 NOTE — ED Notes (Signed)
Pt stated that he took potassium pills at 1345 today. Md notified and discontinued oral potassium.

## 2018-06-30 NOTE — Progress Notes (Signed)
*  PRELIMINARY RESULTS* Echocardiogram 2D Echocardiogram has been performed.  Lavell Luster Nigil Braman 06/30/2018, 9:48 PM

## 2018-06-30 NOTE — ED Notes (Signed)
Date and time results received: 06/30/18 3:56 PM Test: Troponin Critical Value: Trop: 0.08  Name of Provider Notified: Dr Jacqualine Code

## 2018-06-30 NOTE — ED Provider Notes (Signed)
[facility] Emergency Department Provider Note   ____________________________________________   First MD Initiated Contact with Patient 06/30/18 1512     (approximate)  I have reviewed the triage vital signs and the nursing notes.   HISTORY  Chief Complaint Loss of Consciousness    HPI Stephen [name] is a 52 y.o. male previous history of acute kidney injury, previous V. fib arrest, congestive heart failure nonsustained V. tach nonischemic cardiomyopathy  Patient had an episode where he suddenly passed out while at a store today.  He was seated at a table, on suddenly reports that he woke up on the floor.  He reports he did not hurt his head, but is a little bit sore over the left side of his jaw but denies any headache.  Reports he blacked out briefly and awoke on the floor.  He did not feel any symptoms occur before or after the event, and he feels better now.  Currently reports no pain.  No trouble breathing.  No chest pain or leg swelling.  Reports he is been in good health, had  ablation about a month ago with Dr. Haroldine Laws.  Also has an ICD  Sudden onset.  Nothing made it better or worse.  Woke up on the floor.  No neck pain.  Denies headache.  Reports he thinks he kind of bruised his chin when he fell from the chair.  He was seated when he concurred    Past Medical History:  Diagnosis Date  . AICD (automatic cardioverter/defibrillator) present   . AKI (acute kidney injury) ([place]) 12/24/2017  . Arrhythmia   . Arthritis    "lower back, knees" (06/10/2018)  . Cardiac arrest ([place]) 12/23/2017   Brief V-fib arrest  . Chest pain 09/08/2017  . CHF (congestive heart failure) (HCC)    nonischemic cardiomyopathy, EF 25%  . Gout    "on daily RX" (06/10/2018)  . Headache    "q couple months" (06/10/2018)  . High cholesterol   . History of kidney stones   . Hypertension   . Influenza A 12/24/2017  . NICM (nonischemic cardiomyopathy) ([place])   . Pneumonia  12/20/2017  . TIA (transient ischemic attack) 06/21/2016   "still affects my memory a little bit" (06/10/2018)    Patient Active Problem List   Diagnosis Date Noted  . Syncope 06/30/2018  . CKD (chronic kidney disease) stage 3, GFR 30-59 ml/min (HCC) 05/13/2018  . Hyperlipidemia 05/13/2018  . Gout 05/13/2018  . ED (erectile dysfunction) 05/13/2018  . NICM (nonischemic cardiomyopathy) ([place])   . Cardiogenic shock ([place]) 12/24/2017  . 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 History of non-ST elevation myocardial infarction (NSTEMI) 09/08/2017  . Bradycardia 08/30/2017  . Ventricular tachycardia ([place]) 07/29/2017  . COPD (chronic obstructive pulmonary disease) ([place]) 07/04/2017  . Chronic systolic CHF (congestive heart failure) ([place]) 05/20/2017  . TIA (transient ischemic attack) 06/22/2016  . Benign hypertensive renal disease 06/22/2016  . Acute on chronic systolic CHF (congestive heart failure) ([place]) 06/04/2016    Past Surgical History:  Procedure Laterality Date  . EXTERNAL FIXATION LEG Right ~ 2000   "was going bowlegged; had to brake my leg to fix it"  . HERNIA REPAIR    . ICD IMPLANT N/A 12/30/2017   Procedure: ICD IMPLANT;  Surgeon: Deboraha Sprang, MD;  Location: [place] [facility];  Service: Cardiovascular;  Laterality: N/A;  . LEFT HEART CATH AND CORONARY ANGIOGRAPHY N/A 09/09/2017   Procedure: LEFT HEART CATH AND CORONARY ANGIOGRAPHY;  Surgeon: Teodoro Spray, MD;  Location: [place] [facility];  Service: Cardiovascular;  Laterality: N/A;  . UMBILICAL HERNIA REPAIR  1990s  . V TACH ABLATION  06/10/2018  . V TACH ABLATION N/A 06/10/2018   Procedure: V TACH ABLATION;  Surgeon: Thompson Grayer, MD;  Location: [place] [facility];  Service: Cardiovascular;  Laterality: N/A;  . VASECTOMY      Prior to Admission medications   Medication Sig Start Date End Date Taking? Authorizing Provider  acetaminophen (TYLENOL) 325 MG tablet Take 2 tablets (650 mg total) by mouth every 6 (six) hours as  needed. Patient taking differently: Take 325-650 mg by mouth every 6 (six) hours as needed for moderate pain.  03/19/18  Yes Carrie Mew, MD  albuterol (PROVENTIL HFA;VENTOLIN HFA) 108 (90 Base) MCG/ACT inhaler Inhale 2 puffs into the lungs every 4 (four) hours as needed for wheezing or shortness of breath. 03/22/17  Yes [name], MD  amiodarone (PACERONE) 200 MG tablet Take 400 mg by mouth daily.    Yes [provider]  aspirin EC 81 MG EC tablet Take 1 tablet (81 mg total) by mouth daily. 01/01/18  Yes Daune Perch, NP  atorvastatin (LIPITOR) 80 MG tablet Take 1 tablet (80 mg total) by mouth daily at 6 PM. Patient taking differently: Take 80 mg by mouth at bedtime.  05/19/18  Yes Johnson, Megan P, DO  Camphor-Eucalyptus-Menthol (CHEST RUB) 4.8-1.2-2.6 % OINT Apply 1 application topically daily as needed (congestion).    Yes [provider]  carvedilol (COREG) 6.25 MG tablet Take 1 tablet (6.25 mg total) by mouth 2 (two) times daily with a meal. 01/09/18  Yes Tillery, Satira Mccallum, PA-C  clopidogrel (PLAVIX) 75 MG tablet Take 1 tablet (75 mg total) by mouth daily. 01/30/18  Yes Bensimhon, Shaune Pascal, MD  colchicine 0.6 MG tablet Take 0.6 mg by mouth daily as needed (gout flare).    Yes [provider]  diclofenac sodium (VOLTAREN) 1 % GEL Apply 2 g topically 4 (four) times daily as needed (pain).    Yes [provider]  febuxostat (ULORIC) 40 MG tablet Take 1 tablet (40 mg total) by mouth daily. 05/19/18  Yes Johnson, Megan P, DO  fluticasone (FLONASE) 50 MCG/ACT nasal spray Place 2 sprays into both nostrils daily. Patient taking differently: Place 2 sprays into both nostrils daily as needed for allergies.  06/20/17  Yes [name], MD  Menthol, Topical Analgesic, (ICY HOT EX) Apply 1 application topically daily as needed (pain).   Yes [provider]  metolazone (ZAROXOLYN) 2.5 MG tablet Take 2.5 mg by mouth 2 (two) times daily.   Yes [provider]  mexiletine (MEXITIL) 150 MG capsule Take 1 capsule (150 mg total) by mouth 2 (two) times daily. 02/18/18  Yes Deboraha Sprang, MD  Multiple Vitamin (MULTIVITAMIN WITH MINERALS) TABS tablet Take 1 tablet by mouth daily.   Yes [provider]  nitroGLYCERIN (NITROSTAT) 0.4 MG SL tablet Place 1 tablet (0.4 mg total) under the tongue every 5 (five) minutes as needed for chest pain. 02/19/18  Yes Bensimhon, Shaune Pascal, MD  potassium chloride SA (K-DUR,KLOR-CON) 20 MEQ tablet Take 2 tablets (40 mEq total) by mouth 2 (two) times daily. 06/11/18  Yes Baldwin Jamaica, PA-C  sacubitril-valsartan (ENTRESTO) 49-51 MG Take 1 tablet by mouth 2 (two) times daily. 03/26/18  Yes Bensimhon, Shaune Pascal, MD  spironolactone (ALDACTONE) 25 MG tablet Take 1 tablet (25 mg total) by mouth daily. 01/01/18  Yes Daune Perch, NP  Tiotropium Bromide Monohydrate (SPIRIVA RESPIMAT) 2.5 MCG/ACT AERS Inhale 1 puff into the lungs daily.    Yes [provider]  torsemide (DEMADEX) 20 MG tablet Take 2 tablets (40 mg total) by mouth 2 (two) times daily. 03/26/18  Yes Bensimhon, Shaune Pascal, MD  hydrocortisone (ANUSOL-HC) 25 MG suppository Place 1 suppository (25 mg total) rectally 2 (two) times daily. Patient not taking: Reported on 06/30/2018 06/23/18   Park Liter P, DO    Allergies Lisinopril  Family History  Problem Relation Age of Onset  . Hypertension Mother   . Heart failure Mother   . Hypertension Father   . CAD Father   . Heart attack Father     Social History Social History   Tobacco Use  . Smoking status: Former Smoker    Packs/day: 0.33    Years: 33.00    Pack years: 10.89    Types: Cigarettes    Last attempt to quit: 02/20/2016    Years since quitting: 2.3  . Smokeless tobacco: Never Used  Substance Use Topics  . Alcohol use: Yes    Alcohol/week: 0.0 oz    Comment: 06/10/2018 "beer once/month; if that"  . Drug use: Never    Review of Systems Constitutional: No fever/chills  but see HPI eyes: No visual changes. ENT: No sore throat.  Left jaw feels just a little bit sore Cardiovascular: Denies chest pain. Respiratory: Denies shortness of breath. Gastrointestinal: No abdominal pain.  No nausea, no vomiting.  No diarrhea.  No constipation. Genitourinary: Negative for dysuria. Musculoskeletal: Negative for back pain. Skin: Negative for rash. Neurological: Negative for headaches, focal weakness or numbness.    ____________________________________________   PHYSICAL EXAM:  VITAL SIGNS: ED Triage Vitals  Enc Vitals Group     BP 06/30/18 1454 122/77     Pulse Rate 06/30/18 1453 99     Resp 06/30/18 1453 18     Temp 06/30/18 1453 98.6 F (37 C)     Temp src --      SpO2 06/30/18 1453 100 %     Weight 06/30/18 1452 236 lb (107 kg)     Height 06/30/18 1452 6\' 1"  (1.854 m)     Head Circumference --      Peak Flow --      Pain Score 06/30/18 1452 0     Pain Loc --      Pain Edu? --      Excl. in [place]? --     Constitutional: Alert and oriented. Well appearing and in no acute distress. Eyes: Conjunctivae are normal. Head: Atraumatic. Nose: No congestion/rhinnorhea. Mouth/Throat: Mucous membranes are moist. Neck: No stridor.  No cervical spine tenderness. Cardiovascular: Slightly tachycardic with an irregular rhythm grossly normal heart sounds.  Good peripheral circulation. Respiratory: Normal respiratory effort.  No retractions. Lungs CTAB. Gastrointestinal: Soft and nontender. No distention. Musculoskeletal: No lower extremity tenderness nor edema. Neurologic:  Normal speech and language. No gross focal neurologic deficits are appreciated.  Skin:  Skin is warm, dry and intact. No rash noted. Psychiatric: Mood and affect are normal. Speech and behavior are normal.  ____________________________________________   LABS (all labs ordered are listed, but only abnormal results are displayed)  Labs Reviewed  BASIC METABOLIC PANEL - Abnormal; Notable for  the following components:      Result Value   Glucose, Bld 153 (*)    BUN 31 (*)    Creatinine, Ser 1.88 (*)    GFR calc  non Af Amer 39 (*)    GFR calc Af Amer 45 (*)    All other components within normal limits  URINALYSIS, COMPLETE (UACMP) WITH MICROSCOPIC - Abnormal; Notable for the following components:   Color, Urine YELLOW (*)    APPearance CLEAR (*)    All other components within normal limits  GLUCOSE, CAPILLARY - Abnormal; Notable for the following components:   Glucose-Capillary 154 (*)    All other components within normal limits  TROPONIN I - Abnormal; Notable for the following components:   Troponin I 0.08 (*)    All other components within normal limits  BRAIN NATRIURETIC PEPTIDE - Abnormal; Notable for the following components:   B Natriuretic Peptide 549.0 (*)    All other components within normal limits  TROPONIN I - Abnormal; Notable for the following components:   Troponin I 0.10 (*)    All other components within normal limits  CBC  MAGNESIUM  TSH  CBC  TROPONIN I  TROPONIN I  CBG MONITORING, ED   ____________________________________________  EKG  Initial EKG reviewed at 1500 Heart rate 100 QRS 140 QTC 500 A challenging EKG to interpret, appears sinus with multiple PVCs, and underlying appearance of a slight right bundle branch block is seen, but then on other beats appears there is left bundle branch block morphology Do not believe this represents V. tach, but rather likely represents different morphologies of the QRS complex with multiple PVCs.  Do not see evidence of acute ischemia  Repeat EKG performed at 1515 Heart rate 100 QRS 189 QTC 420 Normal sinus rhythm, again with frequent probable PVCs, now having more every underlying left bundle appearance.  No clear ischemic changes in the patient denies pain.  No chest pain ____________________________________________  RADIOLOGY  Dg Chest Portable 1 View  Result Date: 06/30/2018 CLINICAL DATA:   Syncope. EXAM: PORTABLE CHEST 1 VIEW COMPARISON:  Chest x-ray dated March 19, 2018. FINDINGS: Unchanged single lead left chest wall pacer device. Stable moderate cardiomegaly. Pulmonary vascular congestion has resolved. No focal consolidation, pleural effusion, or pneumothorax. No acute osseous abnormality. IMPRESSION: Stable moderate cardiomegaly.  No edema or effusion. Electronically Signed   By: Titus Dubin M.D.   On: 06/30/2018 16:02    Chest x-ray reviewed by me ____________________________________________   PROCEDURES  Procedure(s) performed: None  Procedures  Critical Care performed: No   ____________________________________________   INITIAL IMPRESSION / ASSESSMENT AND PLAN / ED COURSE  Pertinent labs & imaging results that were available during my care of the patient were reviewed by me and considered in my medical decision making (see chart for details).  Patient presents after syncope.  Denies associated head injury, does take aspirin Plavix but no headache, reassuring neurologic exam.  Reports he does bruise his jaw a little bit.  No cervical tenderness.  His EKGs are somewhat bizarre, and concerning for a possible underlying arrhythmia as etiology of his syncope today.  He certainly has a high risk history.  I have placed a page for requested urgent consultation to [facility] health cardiology at this time.  Patient resting presently, asymptomatic, continue to monitor him very closely checking potassium magnesium.  He is currently on amiodarone and carvedilol therapy which she reports compliance to.  He is hemodynamically stable at the present time in no acute distress.  Clinical Course as of Jul 02 111  Mon Jun 30, 2018  1608 [employer] reports: Episode as possible Ventricular Fibrillation (classified by device) with a single lead, began to charge  but successful pacing occurring occurring less than 3 hours ago.    [MQ]    Clinical Course User Index [MQ] Delman Kitten, MD    [employer] notes that full report to be faxed/followed.  ----------------------------------------- 3:47 PM on 06/30/2018 -----------------------------------------  Case, labs and EKG is reviewed by Dr. Fletcher Anon.  He recommends at this time just repletion of potassium, no immediate medication recommendations and that he will see the patient in consultation today.  Patient be admitted to the hospitalist service.  Currently awake stable, denies any ongoing symptoms.  Defibrillator at the bedside in the event the patient should reexperience major tachyarrhythmia, but at present time appears stable for close admission consultation telemetry monitoring. ____________________________________________   FINAL CLINICAL IMPRESSION(S) / ED DIAGNOSES  Final diagnoses:  Syncope, cardiogenic  Multifocal PVCs      NEW MEDICATIONS STARTED DURING THIS VISIT:  Current Discharge Medication List       Note:  This document was prepared using Dragon voice recognition software and may include unintentional dictation errors.     Delman Kitten, MD 07/01/18 [id number]

## 2018-06-30 NOTE — Consult Note (Signed)
Cardiology Consultation:   Patient ID: Stephen [name]; [id number]; 1966-05-06   Admit date: 06/30/2018 Date of Consult: 06/30/2018  Primary Care Provider: Valerie Roys, DO Primary Cardiologist: Fletcher Anon Primary Electrophysiologist:  Caryl Comes Primary Advanced Heart Failure: Bensimhon   Patient Profile:   Stephen [name] is a 52 y.o. male with a hx of normal coronary arteries by LHC in 7824, chronic systolic CHF due to NICM, frequent PVCs, VT/NSVT/VF arrest s/p MDT ICD s/p EPS with ablation 06/10/2018, TIA in 2017 on Plavix, prior tobacco abuse, and HTN who is being seen today for the evaluation of syncope/abnormal EKG at the request of Dr. Tressia Miners.  History of Present Illness:   Stephen [name] underwent echo in 2017 that showed an EF of 20-25%. LHC showed normal coronary arteries. He was noted to have frequent PVCs. He was admitted to the hospital in 23/5361 with flu complicated by cardiogenic shock and VF arrest. He was transferred to [facility] where he was started on NE and milrinone infusions. He had multiple episodes of VT and ultimately underwent ICD placement following cardiac MRI that showed an EF of 16%. He has been managed by the advanced heart failure team, EP, and general cardiology. His PVCs have been felt to be playing a role in his cardiomyopathy. He has been managed on amiodarone and mexiletine. He underwent recent EPS with ablation on 06/10/2018 by Dr. Rayann Heman. At that time his PVCs were of RBB superior axis with the PVC being mapped to the inferolateral basal left ventricle. Extensive ablation was performed in this location. He had significant reduction but not elimination of PVCs. There was question of possible epicardial source. Following this procedure, he felt remarkably well and was without any complications.   He was at a store with a friend this morning when he was sitting a table and suddenly suffered LOC. There were no preceding symptoms. Patient is uncertain how long  he was with LOC. There was no loss of bowel or bladder function. Upon regaining consciousness, he reported feeling sore in his chest, "like I was congested." He was brought to the [facility] ED where he was noted to have frequent ventricular ectopy on 12-lead EKG and on telemetry. He was noted to be in sinus rhythm with frequent PVCs and nonspecific IVCD. Troponin was 0.08, potassium 3.6, magnesium 1.9, CBC unremarkable, BNP 549. BP remained stable. He was asymptomatic in the ED. He was given IV potassium. Currently, asymptomatic, sitting up in bed. Device interrogation showed 1 episode classified as VF treated with ATP without shock.    Past Medical History:  Diagnosis Date  . AICD (automatic cardioverter/defibrillator) present   . AKI (acute kidney injury) ([place]) 12/24/2017  . Arrhythmia   . Arthritis    "lower back, knees" (06/10/2018)  . Cardiac arrest ([place]) 12/23/2017   Brief V-fib arrest  . Chest pain 09/08/2017  . CHF (congestive heart failure) ([facility])    nonischemic cardiomyopathy, EF 25%  . Gout    "on daily RX" (06/10/2018)  . Headache    "q couple months" (06/10/2018)  . High cholesterol   . History of kidney stones   . Hypertension   . Influenza A 12/24/2017  . NICM (nonischemic cardiomyopathy) ([place])   . Pneumonia 12/20/2017  . TIA (transient ischemic attack) 06/21/2016   "still affects my memory a little bit" (06/10/2018)    Past Surgical History:  Procedure Laterality Date  . EXTERNAL FIXATION LEG Right ~ 2000   "was going bowlegged; had to  brake my leg to fix it"  . HERNIA REPAIR    . ICD IMPLANT N/A 12/30/2017   Procedure: ICD IMPLANT;  Surgeon: Deboraha Sprang, MD;  Location: [place] [facility];  Service: Cardiovascular;  Laterality: N/A;  . LEFT HEART CATH AND CORONARY ANGIOGRAPHY N/A 09/09/2017   Procedure: LEFT HEART CATH AND CORONARY ANGIOGRAPHY;  Surgeon: Teodoro Spray, MD;  Location: [place] [facility];  Service: Cardiovascular;  Laterality: N/A;  . UMBILICAL HERNIA  REPAIR  1990s  . V TACH ABLATION  06/10/2018  . V TACH ABLATION N/A 06/10/2018   Procedure: V TACH ABLATION;  Surgeon: Thompson Grayer, MD;  Location: [place] [facility];  Service: Cardiovascular;  Laterality: N/A;  . VASECTOMY       Home Meds: Prior to Admission medications   Medication Sig Start Date End Date Taking? Authorizing Provider  acetaminophen (TYLENOL) 325 MG tablet Take 2 tablets (650 mg total) by mouth every 6 (six) hours as needed. Patient taking differently: Take 325-650 mg by mouth every 6 (six) hours as needed for moderate pain.  03/19/18  Yes Carrie Mew, MD  albuterol (PROVENTIL HFA;VENTOLIN HFA) 108 (90 Base) MCG/ACT inhaler Inhale 2 puffs into the lungs every 4 (four) hours as needed for wheezing or shortness of breath. 03/22/17  Yes [name], MD  amiodarone (PACERONE) 200 MG tablet Take 400 mg by mouth daily.    Yes [provider]  aspirin EC 81 MG EC tablet Take 1 tablet (81 mg total) by mouth daily. 01/01/18  Yes Daune Perch, NP  atorvastatin (LIPITOR) 80 MG tablet Take 1 tablet (80 mg total) by mouth daily at 6 PM. Patient taking differently: Take 80 mg by mouth at bedtime.  05/19/18  Yes Johnson, Megan P, DO  Camphor-Eucalyptus-Menthol (CHEST RUB) 4.8-1.2-2.6 % OINT Apply 1 application topically daily as needed (congestion).    Yes [provider]  carvedilol (COREG) 6.25 MG tablet Take 1 tablet (6.25 mg total) by mouth 2 (two) times daily with a meal. 01/09/18  Yes [name], Satira Mccallum, PA-C  clopidogrel (PLAVIX) 75 MG tablet Take 1 tablet (75 mg total) by mouth daily. 01/30/18  Yes Bensimhon, Shaune Pascal, MD  colchicine 0.6 MG tablet Take 0.6 mg by mouth daily as needed (gout flare).    Yes [provider]  diclofenac sodium (VOLTAREN) 1 % GEL Apply 2 g topically 4 (four) times daily as needed (pain).    Yes [provider]  febuxostat (ULORIC) 40 MG tablet Take 1 tablet (40 mg total) by mouth daily. 05/19/18  Yes Johnson,  Megan P, DO  fluticasone (FLONASE) 50 MCG/ACT nasal spray Place 2 sprays into both nostrils daily. Patient taking differently: Place 2 sprays into both nostrils daily as needed for allergies.  06/20/17  Yes [name], MD  Menthol, Topical Analgesic, (ICY HOT EX) Apply 1 application topically daily as needed (pain).   Yes [provider]  metolazone (ZAROXOLYN) 2.5 MG tablet Take 2.5 mg by mouth 2 (two) times daily.   Yes [provider]  mexiletine (MEXITIL) 150 MG capsule Take 1 capsule (150 mg total) by mouth 2 (two) times daily. 02/18/18  Yes Deboraha Sprang, MD  Multiple Vitamin (MULTIVITAMIN WITH MINERALS) TABS tablet Take 1 tablet by mouth daily.   Yes [provider]  nitroGLYCERIN (NITROSTAT) 0.4 MG SL tablet Place 1 tablet (0.4 mg total) under the tongue every 5 (five) minutes as needed for chest pain. 02/19/18  Yes Bensimhon, Shaune Pascal, MD  potassium chloride SA (K-DUR,KLOR-CON) 20 MEQ tablet Take 2 tablets (40 mEq total) by mouth 2 (two) times daily. 06/11/18  Yes Baldwin Jamaica, PA-C  sacubitril-valsartan (ENTRESTO) 49-51 MG Take 1 tablet by mouth 2 (two) times daily. 03/26/18  Yes Bensimhon, Shaune Pascal, MD  spironolactone (ALDACTONE) 25 MG tablet Take 1 tablet (25 mg total) by mouth daily. 01/01/18  Yes Daune Perch, NP  Tiotropium Bromide Monohydrate (SPIRIVA RESPIMAT) 2.5 MCG/ACT AERS Inhale 1 puff into the lungs daily.    Yes [provider]  torsemide (DEMADEX) 20 MG tablet Take 2 tablets (40 mg total) by mouth 2 (two) times daily. 03/26/18  Yes Bensimhon, Shaune Pascal, MD  hydrocortisone (ANUSOL-HC) 25 MG suppository Place 1 suppository (25 mg total) rectally 2 (two) times daily. Patient not taking: Reported on 06/30/2018 06/23/18   Valerie Roys, DO    Inpatient Medications: Scheduled Meds:  Continuous Infusions: . potassium chloride     PRN Meds:   Allergies:   Allergies  Allergen Reactions  . Lisinopril Cough    Social History:     Social History   Socioeconomic History  . Marital status: Divorced    Spouse name: Not on file  . Number of children: Not on file  . Years of education: Not on file  . Highest education level: Not on file  Occupational History  . Not on file  Social Needs  . Financial resource strain: Not on file  . Food insecurity:    Worry: Not on file    Inability: Not on file  . Transportation needs:    Medical: Not on file    Non-medical: Not on file  Tobacco Use  . Smoking status: Former Smoker    Packs/day: 0.33    Years: 33.00    Pack years: 10.89    Types: Cigarettes    Last attempt to quit: 02/20/2016    Years since quitting: 2.3  . Smokeless tobacco: Never Used  Substance and Sexual Activity  . Alcohol use: Yes    Alcohol/week: 0.0 oz    Comment: 06/10/2018 "beer once/month; if that"  . Drug use: Never  . Sexual activity: Yes  Lifestyle  . Physical activity:    Days per week: Not on file    Minutes per session: Not on file  . Stress: Not on file  Relationships  . Social connections:    Talks on phone: Not on file    Gets together: Not on file    Attends religious service: Not on file    Active member of club or organization: Not on file    Attends meetings of clubs or organizations: Not on file    Relationship status: Not on file  . Intimate partner violence:    Fear of current or ex partner: Not on file    Emotionally abused: Not on file    Physically abused: Not on file    Forced sexual activity: Not on file  Other Topics Concern  . Not on file  Social History Narrative   Independent at baseline, ambulates steadily     Family History:   Family History  Problem Relation Age of Onset  . Hypertension Mother   . Heart failure Mother   . Hypertension Father   . CAD Father   . Heart attack Father     ROS:  Review of Systems  Constitutional: Positive for malaise/fatigue. Negative for chills, diaphoresis, fever and weight loss.  HENT: Negative for congestion.    Eyes:  Negative for discharge and redness.  Respiratory: Negative for cough, hemoptysis, sputum production, shortness of breath and wheezing.   Cardiovascular: Negative for chest pain, palpitations, orthopnea, claudication, leg swelling and PND.  Gastrointestinal: Negative for abdominal pain, blood in stool, heartburn, melena, nausea and vomiting.  Genitourinary: Negative for hematuria.  Musculoskeletal: Negative for falls and myalgias.  Skin: Negative for rash.  Neurological: Positive for loss of consciousness and weakness. Negative for dizziness, tingling, tremors, sensory change, speech change, focal weakness, seizures and headaches.  Endo/Heme/Allergies: Does not bruise/bleed easily.  Psychiatric/Behavioral: Negative for substance abuse. The patient is not nervous/anxious.   All other systems reviewed and are negative.     Physical Exam/Data:   Vitals:   06/30/18 1452 06/30/18 1453 06/30/18 1454  BP:   122/77  Pulse:  99   Resp:  18   Temp:  98.6 F (37 C)   SpO2:  100%   Weight: 236 lb (107 kg)    Height: 6\' 1"  (1.854 m)     No intake or output data in the 24 hours ending 06/30/18 1702 Filed Weights   06/30/18 1452  Weight: 236 lb (107 kg)   Body mass index is 31.14 kg/m.   Physical Exam: General: Well developed, well nourished, in no acute distress. Head: Normocephalic, atraumatic, sclera non-icteric, no xanthomas, nares without discharge.  Neck: Negative for carotid bruits. JVD not elevated. Lungs: Clear bilaterally to auscultation without wheezes, rales, or rhonchi. Breathing is unlabored. Heart: Irregular with S1 S2. No murmurs, rubs, or gallops appreciated. Abdomen: Soft, non-tender, non-distended with normoactive bowel sounds. No hepatomegaly. No rebound/guarding. No obvious abdominal masses. Msk:  Strength and tone appear normal for age. Extremities: No clubbing or cyanosis. No edema. Distal pedal pulses are 2+ and equal bilaterally. Neuro: Alert and oriented  X 3. No facial asymmetry. No focal deficit. Moves all extremities spontaneously. Psych:  Responds to questions appropriately with a normal affect.   EKG:  The EKG was personally reviewed and demonstrates: sinus tachycardia, 101 bpm, left axis, nonspecific IVCD, frequent PVCs in a pattern of ventricular bigeminy Telemetry:  Telemetry was personally reviewed and demonstrates: NSR, IVCD, frequent PVCs  Weights: Filed Weights   06/30/18 1452  Weight: 236 lb (107 kg)    Relevant CV Studies: EPS with ablation 06/10/2018: Conclusion   SURGEON: Thompson Grayer, MD.  PREPROCEDURE DIAGNOSIS: Ventricular tachycardia, frequent premature ventricular contractions  POSTPROCEDURE DIAGNOSIS:frequent premature ventricular contractions arising from the inferolateral basal left ventricle  PROCEDURE: 1. Comprehensive EP study. 2. Coronary sinus pacing and recording. 3. Left atrial and left ventricular pacing and recording. 4. A 3D mapping of ventricular tachycardia. 5. Radiofrequency ablation of ventricular tachycardia. 6. Arterial blood pressure monitoring. 7. single-chamber defibrillator, interrogation, and reprogramming.     Echo 04/2018: Study Conclusions  - Left ventricle: The cavity size was severely dilated. Wall   thickness was normal. Systolic function was severely reduced. The   estimated ejection fraction was in the range of 10% to 15%.   Severe diffuse hypokinesis with regional variations. The study is   not technically sufficient to allow evaluation of LV diastolic   function. No evidence of thrombus. - Ventricular septum: Septal motion showed abnormal function,   dyssynergy, and paradox. - Mitral valve: There was mild regurgitation. - Left atrium: The atrium was moderately dilated. - Right atrium: The atrium was mildly dilated.  Impressions:  - Doppler signs of severely reduced cardiac output are present.   Persistent ventricular bigeminy with ineffective PVCs noted    (&  quot;effective heart rate&quot; 25 bpm).  Laboratory Data:  Chemistry Recent Labs  Lab 06/30/18 1503  NA 139  K 3.6  CL 101  CO2 24  GLUCOSE 153*  BUN 31*  CREATININE 1.88*  CALCIUM 9.3  GFRNONAA 39*  GFRAA 45*  ANIONGAP 14    No results for input(s): PROT, ALBUMIN, AST, ALT, ALKPHOS, BILITOT in the last 168 hours. Hematology Recent Labs  Lab 06/30/18 1503  WBC 9.6  RBC 5.51  HGB 16.1  HCT 48.2  MCV 87.4  MCH 29.3  MCHC 33.5  RDW 14.2  PLT 249   Cardiac Enzymes Recent Labs  Lab 06/30/18 1505  TROPONINI 0.08*   No results for input(s): TROPIPOC in the last 168 hours.  BNP Recent Labs  Lab 06/30/18 1503  BNP 549.0*    DDimer No results for input(s): DDIMER in the last 168 hours.  Radiology/Studies:  Dg Chest Portable 1 View  Result Date: 06/30/2018 IMPRESSION: Stable moderate cardiomegaly.  No edema or effusion. Electronically Signed   By: Titus Dubin M.D.   On: 06/30/2018 16:02   Device interrogation 06/30/2018:  1 episode classified as VF treated with ATP with shock aborted 3 episodes of NSVT monitored    Assessment and Plan:   1. Syncope: -Review of device interrogation shows 1 episode of classified as VF treated with ATP with shock aborted  -Case discussed with Drs. Arida and Camnitz -Will increase mexiletine to 200 mg bid -Continue amiodarone 200 mg daily, consider reloading  -Replete potassium and magnesium to goal of > 4.0 and > 2.0 respectively  -Continue Coreg, consider increasing dose to 12.5 mg bid -A message has been sent to [name] this evening to consult on the patient 7/2 -Telemetry  2. Abnormal EKG/frequent PVCs: -As above  3. NICM: -He does not appear grossly volume up at this time -Continue Coreg, Entresto, spironolactone, torsemide, metolazone -Recent echo as above  4. History of VT/NSVT: -Status post MDT ICD as above -Continue current medications as above -Replete lytes -Check TSH  5. Elevated  troponin: -Likely in the setting of the above -Recent LHC with normal coronary arteries -Cycle to rule out -No indication for heparin gtt at this time unless there is dynamic troponin elevation -Recent TTE as above, repeat   For questions or updates, please contact [facility] Please consult [email or web address] for contact info under Cardiology/STEMI.   Signed, Christell Faith, PA-C [place] Pager: [phone] 06/30/2018, 5:02 PM

## 2018-06-30 NOTE — ED Triage Notes (Signed)
Patient presents to ED via POV post syncopal event. Patient reports he was at the store and began to feel very hot. Patient then states he woke up on the floor. Patient does have a defibrillator. Patient denies pain at this time. Patient denies weakness or dizziness at this time. Patient reports he lost control of his bladder during the event.

## 2018-06-30 NOTE — ED Notes (Signed)
FIRST NURSE NOTE: Pt ambulatory, states that he had syncopal episode this AM. Pt has defibrillator and is unsure if related to syncope or not. Pt states that he "does not feel any electricity in my chest".  Pt alert and oriented X4, active, cooperative, pt in NAD. RR even and unlabored, color WNL.

## 2018-06-30 NOTE — Telephone Encounter (Signed)
Called into [facility].

## 2018-06-30 NOTE — Telephone Encounter (Signed)
There is no way for me to put that in- can you call it in.

## 2018-06-30 NOTE — H&P (Signed)
[place] [facility] [place] NAME: Stephen [name]    MR#:  [id number]  DATE OF BIRTH:  [birth year]  DATE OF ADMISSION:  06/30/2018  PRIMARY CARE PHYSICIAN: Valerie Roys, DO   REQUESTING/REFERRING PHYSICIAN: Dr. Delman Kitten  CHIEF COMPLAINT:   Chief Complaint  Patient presents with  . Loss of Consciousness    HISTORY OF PRESENT ILLNESS:  Stephen [name]  is a 52 y.o. male with a known history of cardiomyopathy with EF of 10 to 15%, V. fib cardiac arrest status post ICD, multiple PVCs status post recent V. tach ablation, hypertension presents to hospital after syncopal episode. Patient is alert oriented at this time.  He states he was doing well up until this morning.  He went to a Environmental consultant.  He started feeling dizzy so he had to sit down and the next thing he remembers he woke up on the floor.  Denies any chest pain prior to the episode.  He was oriented after he woke up.  No evidence of any seizure-like activity.  Denies any electrical shocking in his chest.  No palpitations or chest pain prior to the episode.  EKG with significant PVCs and bundle branch block.  Already takes mexiletine for his PVC burden and had a recent V. tach ablation done 3 weeks ago.  Being admitted under observation for further syncope work-up to rule out cardiogenic causes.  PAST MEDICAL HISTORY:   Past Medical History:  Diagnosis Date  . AICD (automatic cardioverter/defibrillator) present   . AKI (acute kidney injury) ([place]) 12/24/2017  . Arrhythmia   . Arthritis    "lower back, knees" (06/10/2018)  . Cardiac arrest ([place]) 12/23/2017   Brief V-fib arrest  . Chest pain 09/08/2017  . CHF (congestive heart failure) (H[facility])    nonischemic cardiomyopathy, EF 25%  . Gout    "on daily RX" (06/10/2018)  . Headache    "q couple months" (06/10/2018)  . High cholesterol   . History of kidney stones   . Hypertension   . Influenza A 12/24/2017  . NICM (nonischemic  cardiomyopathy) ([place])   . Pneumonia 12/20/2017  . TIA (transient ischemic attack) 06/21/2016   "still affects my memory a little bit" (06/10/2018)    PAST SURGICAL HISTORY:   Past Surgical History:  Procedure Laterality Date  . EXTERNAL FIXATION LEG Right ~ 2000   "was going bowlegged; had to brake my leg to fix it"  . HERNIA REPAIR    . ICD IMPLANT N/A 12/30/2017   Procedure: ICD IMPLANT;  Surgeon: Deboraha Sprang, MD;  Location: [facility];  Service: Cardiovascular;  Laterality: N/A;  . LEFT HEART CATH AND CORONARY ANGIOGRAPHY N/A 09/09/2017   Procedure: LEFT HEART CATH AND CORONARY ANGIOGRAPHY;  Surgeon: Teodoro Spray, MD;  Location: [facility];  Service: Cardiovascular;  Laterality: N/A;  . UMBILICAL HERNIA REPAIR  1990s  . V TACH ABLATION  06/10/2018  . V TACH ABLATION N/A 06/10/2018   Procedure: V TACH ABLATION;  Surgeon: Thompson Grayer, MD;  Location: [facility];  Service: Cardiovascular;  Laterality: N/A;  . VASECTOMY      SOCIAL HISTORY:   Social History   Tobacco Use  . Smoking status: Former Smoker    Packs/day: 0.33    Years: 33.00    Pack years: 10.89    Types: Cigarettes    Last attempt to quit: 02/20/2016    Years since quitting: 2.3  .  Smokeless tobacco: Never Used  Substance Use Topics  . Alcohol use: Yes    Alcohol/week: 0.0 oz    Comment: 06/10/2018 "beer once/month; if that"    FAMILY HISTORY:   Family History  Problem Relation Age of Onset  . Hypertension Mother   . Heart failure Mother   . Hypertension Father   . CAD Father   . Heart attack Father     DRUG ALLERGIES:   Allergies  Allergen Reactions  . Lisinopril Cough    REVIEW OF SYSTEMS:   Review of Systems  Constitutional: Negative for chills, fever, malaise/fatigue and weight loss.  HENT: Negative for ear discharge, ear pain, hearing loss, nosebleeds and tinnitus.   Eyes: Negative for blurred vision, double vision and photophobia.  Respiratory: Negative  for cough, hemoptysis, shortness of breath and wheezing.   Cardiovascular: Negative for chest pain, palpitations, orthopnea and leg swelling.  Gastrointestinal: Negative for abdominal pain, constipation, diarrhea, heartburn, melena, nausea and vomiting.  Genitourinary: Negative for dysuria, frequency, hematuria and urgency.  Musculoskeletal: Negative for back pain, myalgias and neck pain.  Skin: Negative for rash.  Neurological: Negative for dizziness, tingling, tremors, sensory change, speech change, focal weakness and headaches.       Syncope  Endo/Heme/Allergies: Does not bruise/bleed easily.  Psychiatric/Behavioral: Negative for depression.    MEDICATIONS AT HOME:   Prior to Admission medications   Medication Sig Start Date End Date Taking? Authorizing Provider  acetaminophen (TYLENOL) 325 MG tablet Take 2 tablets (650 mg total) by mouth every 6 (six) hours as needed. Patient taking differently: Take 325-650 mg by mouth every 6 (six) hours as needed for moderate pain.  03/19/18  Yes Carrie Mew, MD  albuterol (PROVENTIL HFA;VENTOLIN HFA) 108 (90 Base) MCG/ACT inhaler Inhale 2 puffs into the lungs every 4 (four) hours as needed for wheezing or shortness of breath. 03/22/17  Yes [name], MD  amiodarone (PACERONE) 200 MG tablet Take 400 mg by mouth daily.    Yes [provider]  aspirin EC 81 MG EC tablet Take 1 tablet (81 mg total) by mouth daily. 01/01/18  Yes Daune Perch, NP  atorvastatin (LIPITOR) 80 MG tablet Take 1 tablet (80 mg total) by mouth daily at 6 PM. Patient taking differently: Take 80 mg by mouth at bedtime.  05/19/18  Yes Johnson, Megan P, DO  Camphor-Eucalyptus-Menthol (CHEST RUB) 4.8-1.2-2.6 % OINT Apply 1 application topically daily as needed (congestion).    Yes [provider]  carvedilol (COREG) 6.25 MG tablet Take 1 tablet (6.25 mg total) by mouth 2 (two) times daily with a meal. 01/09/18  Yes Tillery, Satira Mccallum, PA-C  clopidogrel  (PLAVIX) 75 MG tablet Take 1 tablet (75 mg total) by mouth daily. 01/30/18  Yes Bensimhon, Shaune Pascal, MD  colchicine 0.6 MG tablet Take 0.6 mg by mouth daily as needed (gout flare).    Yes [provider]  diclofenac sodium (VOLTAREN) 1 % GEL Apply 2 g topically 4 (four) times daily as needed (pain).    Yes [provider]  febuxostat (ULORIC) 40 MG tablet Take 1 tablet (40 mg total) by mouth daily. 05/19/18  Yes Johnson, Megan P, DO  fluticasone (FLONASE) 50 MCG/ACT nasal spray Place 2 sprays into both nostrils daily. Patient taking differently: Place 2 sprays into both nostrils daily as needed for allergies.  06/20/17  Yes [name], MD  Menthol, Topical Analgesic, (ICY HOT EX) Apply 1 application topically daily as needed (pain).   Yes [provider]  metolazone (ZAROXOLYN) 2.5 MG tablet Take 2.5 mg by mouth 2 (two) times daily.   Yes [provider]  mexiletine (MEXITIL) 150 MG capsule Take 1 capsule (150 mg total) by mouth 2 (two) times daily. 02/18/18  Yes Deboraha Sprang, MD  Multiple Vitamin (MULTIVITAMIN WITH MINERALS) TABS tablet Take 1 tablet by mouth daily.   Yes [provider]  nitroGLYCERIN (NITROSTAT) 0.4 MG SL tablet Place 1 tablet (0.4 mg total) under the tongue every 5 (five) minutes as needed for chest pain. 02/19/18  Yes Bensimhon, Shaune Pascal, MD  potassium chloride SA (K-DUR,KLOR-CON) 20 MEQ tablet Take 2 tablets (40 mEq total) by mouth 2 (two) times daily. 06/11/18  Yes Baldwin Jamaica, PA-C  sacubitril-valsartan (ENTRESTO) 49-51 MG Take 1 tablet by mouth 2 (two) times daily. 03/26/18  Yes Bensimhon, Shaune Pascal, MD  spironolactone (ALDACTONE) 25 MG tablet Take 1 tablet (25 mg total) by mouth daily. 01/01/18  Yes Daune Perch, NP  Tiotropium Bromide Monohydrate (SPIRIVA RESPIMAT) 2.5 MCG/ACT AERS Inhale 1 puff into the lungs daily.    Yes [provider]  torsemide (DEMADEX) 20 MG tablet Take 2 tablets (40 mg total) by mouth 2  (two) times daily. 03/26/18  Yes Bensimhon, Shaune Pascal, MD  hydrocortisone (ANUSOL-HC) 25 MG suppository Place 1 suppository (25 mg total) rectally 2 (two) times daily. Patient not taking: Reported on 06/30/2018 06/23/18   Park Liter P, DO      VITAL SIGNS:  Blood pressure (!) 110/92, pulse (!) 39, temperature 98.6 F (37 C), resp. rate 13, height 6\' 1"  (1.854 m), weight 107 kg (236 lb), SpO2 96 %.  PHYSICAL EXAMINATION:   Physical Exam  GENERAL:  52 y.o.-year-old patient lying in the bed with no acute distress.  EYES: Pupils equal, round, reactive to light and accommodation. No scleral icterus. Extraocular muscles intact.  HEENT: Head atraumatic, normocephalic. Oropharynx and nasopharynx clear.  NECK:  Supple, no jugular venous distention. No thyroid enlargement, no tenderness.  LUNGS: Normal breath sounds bilaterally, no wheezing, rales,rhonchi or crepitation. No use of accessory muscles of respiration.  CARDIOVASCULAR: S1, S2 normal. No rubs, or gallops. 3/6 systolic murmur present. AICD on left chest ABDOMEN: Soft, nontender, nondistended. Bowel sounds present. No organomegaly or mass.  EXTREMITIES: No pedal edema, cyanosis, or clubbing.  NEUROLOGIC: Cranial nerves II through XII are intact. Muscle strength 5/5 in all extremities. Sensation intact. Gait not checked.  PSYCHIATRIC: The patient is alert and oriented x 3.  SKIN: No obvious rash, lesion, or ulcer.   LABORATORY PANEL:   CBC Recent Labs  Lab 06/30/18 1503  WBC 9.6  HGB 16.1  HCT 48.2  PLT 249   ------------------------------------------------------------------------------------------------------------------  Chemistries  Recent Labs  Lab 06/30/18 1503 06/30/18 1515  NA 139  --   K 3.6  --   CL 101  --   CO2 24  --   GLUCOSE 153*  --   BUN 31*  --   CREATININE 1.88*  --   CALCIUM 9.3  --   MG  --  1.9    ------------------------------------------------------------------------------------------------------------------  Cardiac Enzymes Recent Labs  Lab 06/30/18 1505  TROPONINI 0.08*   ------------------------------------------------------------------------------------------------------------------  RADIOLOGY:  Dg Chest Portable 1 View  Result Date: 06/30/2018 CLINICAL DATA:  Syncope. EXAM: PORTABLE CHEST 1 VIEW COMPARISON:  Chest x-ray dated March 19, 2018. FINDINGS: Unchanged single lead left chest wall pacer device. Stable moderate cardiomegaly. Pulmonary vascular congestion has resolved. No focal consolidation, pleural effusion, or pneumothorax. No acute osseous  abnormality. IMPRESSION: Stable moderate cardiomegaly.  No edema or effusion. Electronically Signed   By: Titus Dubin M.D.   On: 06/30/2018 16:02    EKG:   Orders placed or performed during the hospital encounter of 06/30/18  . ED EKG  . ED EKG    IMPRESSION AND PLAN:   [place] Retana  is a 52 y.o. male with a known history of cardiomyopathy with EF of 10 to 15%, V. fib cardiac arrest status post ICD, multiple PVCs status post recent V. tach ablation, hypertension presents to hospital after syncopal episode.  1.  Syncope-concern for cardiogenic cause.  Especially with multiple PVCs -Admit to telemetry, consult cardiology -Recent echocardiogram done showing EF of 10 to 15% -EP cardiology referral -Recent V. tach ablation done 3 weeks ago. -Continue mexiletine-dose increased per cardiology.  Also on amiodarone and Coreg. -Monitor electrolytes.  2.  Nonischemic cardiomyopathy-systolic CHF-well compensated -Continue cardiac medications.  Patient on aspirin, statin, Coreg, Entresto, Aldactone -Continue diuretics with torsemide and metolazone  3.  History of CVA-on aspirin, statin and Plavix.  No residual deficits  4.  Gout-continue home medications.  No acute flare.  5.  DVT prophylaxis-Lovenox   All the  records are reviewed and case discussed with ED provider. Management plans discussed with the patient, family and they are in agreement.  CODE STATUS: Full Code  TOTAL TIME TAKING CARE OF THIS PATIENT: 50 minutes.    [name] M.D on 06/30/2018 at 5:07 PM  Between 7am to 6pm - Pager - [phone]  After 6pm go to [email or web address] - password [email or web address] [facility]  Office  [phone]  CC: Primary care physician; Valerie Roys, DO

## 2018-06-30 NOTE — Telephone Encounter (Signed)
This the is preferred drug Hydrocortisone 25mg  Rectal Sup instead of the Hydrocortisone Acetate 25MG  suppositories, can this be changed and sent to his pharmacy

## 2018-07-01 ENCOUNTER — Telehealth: Payer: Self-pay

## 2018-07-01 DIAGNOSIS — I428 Other cardiomyopathies: Secondary | ICD-10-CM | POA: Diagnosis not present

## 2018-07-01 DIAGNOSIS — T827XXA Infection and inflammatory reaction due to other cardiac and vascular devices, implants and grafts, initial encounter: Secondary | ICD-10-CM | POA: Diagnosis not present

## 2018-07-01 DIAGNOSIS — I459 Conduction disorder, unspecified: Secondary | ICD-10-CM

## 2018-07-01 DIAGNOSIS — I472 Ventricular tachycardia: Secondary | ICD-10-CM | POA: Diagnosis not present

## 2018-07-01 LAB — CBC
HEMATOCRIT: 42.5 % (ref 40.0–52.0)
HEMOGLOBIN: 14.6 g/dL (ref 13.0–18.0)
MCH: 29.7 pg (ref 26.0–34.0)
MCHC: 34.3 g/dL (ref 32.0–36.0)
MCV: 86.7 fL (ref 80.0–100.0)
Platelets: 213 10*3/uL (ref 150–440)
RBC: 4.9 MIL/uL (ref 4.40–5.90)
RDW: 14.1 % (ref 11.5–14.5)
WBC: 9 10*3/uL (ref 3.8–10.6)

## 2018-07-01 LAB — TROPONIN I
Troponin I: 0.1 ng/mL (ref <0.03)
Troponin I: 0.09 ng/mL (ref <0.03)

## 2018-07-01 LAB — URINALYSIS, COMPLETE (UACMP) WITH MICROSCOPIC
Bacteria, UA: NONE SEEN
Bilirubin Urine: NEGATIVE
GLUCOSE, UA: NEGATIVE mg/dL
Hgb urine dipstick: NEGATIVE
KETONES UR: NEGATIVE mg/dL
Leukocytes, UA: NEGATIVE
NITRITE: NEGATIVE
PH: 6 (ref 5.0–8.0)
Protein, ur: NEGATIVE mg/dL
Specific Gravity, Urine: 1.008 (ref 1.005–1.030)
WBC UA: NONE SEEN WBC/hpf (ref 0–5)

## 2018-07-01 LAB — ECHOCARDIOGRAM COMPLETE
Height: 73 in
Weight: 3776 oz

## 2018-07-01 MED ORDER — SODIUM CHLORIDE 0.9% FLUSH
3.0000 mL | Freq: Two times a day (BID) | INTRAVENOUS | Status: DC
  Administered 2018-07-01 – 2018-07-05 (×7): 3 mL via INTRAVENOUS

## 2018-07-01 MED ORDER — CARVEDILOL 3.125 MG PO TABS
3.1250 mg | ORAL_TABLET | Freq: Two times a day (BID) | ORAL | Status: DC
  Administered 2018-07-02 – 2018-07-06 (×8): 3.125 mg via ORAL
  Filled 2018-07-01 (×8): qty 1

## 2018-07-01 NOTE — Plan of Care (Signed)
  Problem: Pain Managment: Goal: General experience of comfort will improve Outcome: Progressing   Problem: Safety: Goal: Ability to remain free from injury will improve Outcome: Progressing   Problem: Skin Integrity: Goal: Risk for impaired skin integrity will decrease Outcome: Progressing   

## 2018-07-01 NOTE — Progress Notes (Signed)
Per Christell Faith, PA hold carvedilol, Torsemide, Spironolactone, KLOR-M20, and metolazone morning dose due to patients low bp, BP 91/55 HR 80. Will continue to monitor.

## 2018-07-01 NOTE — Plan of Care (Signed)
Will continue to monitor for any arrhythmias and symptoms related

## 2018-07-01 NOTE — Progress Notes (Signed)
Patient due for carvedilol and torsemide, however patients bp continue to be low 98/78 HR 79, Per Dr. Dr. Saunders Revel will cut down carvedilol to 3.125, and okay to give torsemide. Will continue to monitor.

## 2018-07-01 NOTE — Telephone Encounter (Signed)
-----   Message from Abbie Sons, MD sent at 07/01/2018 11:36 AM EDT ----- PSA and testosterone level showed no significant abnormalities.

## 2018-07-01 NOTE — Progress Notes (Signed)
[facility] at [place] NAME: Stephen [name]    MR#:  [id number]  DATE OF BIRTH:  31-Mar-1966  SUBJECTIVE:   Patient here due to syncope.  No further episodes of syncope overnight.  No acute arrhythmias.  No chest pain, no other complaints.  REVIEW OF SYSTEMS:    Review of Systems  Constitutional: Negative for chills and fever.  HENT: Negative for congestion and tinnitus.   Eyes: Negative for blurred vision and double vision.  Respiratory: Negative for cough, shortness of breath and wheezing.   Cardiovascular: Negative for chest pain, orthopnea and PND.  Gastrointestinal: Negative for abdominal pain, diarrhea, nausea and vomiting.  Genitourinary: Negative for dysuria and hematuria.  Neurological: Negative for dizziness, sensory change and focal weakness.  All other systems reviewed and are negative.   Nutrition: Heart Healthy Tolerating Diet: Yes Tolerating PT: Ambulatory   DRUG ALLERGIES:   Allergies  Allergen Reactions  . Lisinopril Cough    VITALS:  Blood pressure 103/61, pulse (!) 32, temperature 98.2 F (36.8 C), temperature source Oral, resp. rate 18, height 6\' 1"  (1.854 m), weight 107 kg (236 lb), SpO2 98 %.  PHYSICAL EXAMINATION:   Physical Exam  GENERAL:  52 y.o.-year-old patient lying in bed in no acute distress.  EYES: Pupils equal, round, reactive to light and accommodation. No scleral icterus. Extraocular muscles intact.  HEENT: Head atraumatic, normocephalic. Oropharynx and nasopharynx clear.  NECK:  Supple, no jugular venous distention. No thyroid enlargement, no tenderness.  LUNGS: Normal breath sounds bilaterally, no wheezing, rales, rhonchi. No use of accessory muscles of respiration.  CARDIOVASCULAR: S1, S2 normal. No murmurs, rubs, or gallops.  ABDOMEN: Soft, nontender, nondistended. Bowel sounds present. No organomegaly or mass.  EXTREMITIES: No cyanosis, clubbing or edema b/l.    NEUROLOGIC: Cranial  nerves II through XII are intact. No focal Motor or sensory deficits b/l.   PSYCHIATRIC: The patient is alert and oriented x 3.  SKIN: No obvious rash, lesion, or ulcer.    LABORATORY PANEL:   CBC Recent Labs  Lab 07/01/18 0559  WBC 9.0  HGB 14.6  HCT 42.5  PLT 213   ------------------------------------------------------------------------------------------------------------------  Chemistries  Recent Labs  Lab 06/30/18 1503 06/30/18 1515  NA 139  --   K 3.6  --   CL 101  --   CO2 24  --   GLUCOSE 153*  --   BUN 31*  --   CREATININE 1.88*  --   CALCIUM 9.3  --   MG  --  1.9   ------------------------------------------------------------------------------------------------------------------  Cardiac Enzymes Recent Labs  Lab 07/01/18 0559  TROPONINI 0.09*   ------------------------------------------------------------------------------------------------------------------  RADIOLOGY:  Dg Chest Portable 1 View  Result Date: 06/30/2018 CLINICAL DATA:  Syncope. EXAM: PORTABLE CHEST 1 VIEW COMPARISON:  Chest x-ray dated March 19, 2018. FINDINGS: Unchanged single lead left chest wall pacer device. Stable moderate cardiomegaly. Pulmonary vascular congestion has resolved. No focal consolidation, pleural effusion, or pneumothorax. No acute osseous abnormality. IMPRESSION: Stable moderate cardiomegaly.  No edema or effusion. Electronically Signed   By: Titus Dubin M.D.   On: 06/30/2018 16:02     ASSESSMENT AND PLAN:   52 year old male with past medical history of nonischemic cardiomyopathy status post AICD, hypertension, previous TIA, history of ventricular tachycardia, gout who presented to the hospital due to a syncopal episode.  1.  Syncope-concern for cardiogenic cause.  Especially with multiple PVCs and hx of VT -Seen by cardiology/electrophysiology and AICD was interrogated which  shows evidence of VT. - As per EP we will increase mexiletine dose, continue amiodarone.   Follow for any further arrhythmias overnight.  If no further episodes likely discharge on the higher dose of medications but if continues to have symptoms patient will likely need to be transferred to  [facility] for advanced heart failure therapy +/- VAD/transplant - cont. Mexilitine, Amiodarone for now. - as per Cards keep K > 4 and Mg. > 2.    2.  Nonischemic cardiomyopathy-systolic CHF-well compensated - cont. Coreg, Entresto, Aldactone, torsemide.  Await repeat echocardiogram results.  3.  History of CVA-on aspirin, statin and Plavix.  No residual deficits  4.  Gout- cont. colchicine, Uloric - no acute attack.   5. COPD - acute exacerbation.  - cont. Spiriva.   All the records are reviewed and case discussed with Care Management/Social Worker. Management plans discussed with the patient, family and they are in agreement.  CODE STATUS: Full code  DVT Prophylaxis: Lovenox  TOTAL TIME TAKING CARE OF THIS PATIENT: 30 minutes.   POSSIBLE D/C IN 1-2 DAYS, DEPENDING ON CLINICAL CONDITION.   Henreitta Leber M.D on 07/01/2018 at 4:22 PM  Between 7am to 6pm - Pager - [phone]  After 6pm go to [email or web address] - Technical brewer [facility]  Office  [phone]  CC: Primary care physician; Valerie Roys, DO

## 2018-07-01 NOTE — Consult Note (Addendum)
Cardiology Consultation:   Patient ID: Stephen [name]; [id number]; Jul 31, 1966   Admit date: 06/30/2018 Date of Consult: 07/01/2018  Primary Care Provider: Valerie Roys, DO Primary Cardiologist: Fletcher Anon Primary Electrophysiologist:  Caryl Comes Primary Advanced Heart Failure: Bensimhon   Patient Profile:   Stephen [name] is a 52 y.o. male with a hx of normal coronary arteries by LHC in 4196, chronic systolic CHF due to NICM, frequent PVCs, VT/NSVT/VF arrest s/p MDT ICD s/p EPS with ablation 06/10/2018, TIA in 2017 on Plavix, prior tobacco abuse, and HTN who is being seen today for the evaluation of syncope/abnormal EKG at the request of Dr. Tressia Miners.  History of Present Illness:   [name] underwent echo in 2017 that showed an EF of 20-25%. LHC showed normal coronary arteries. He was noted to have frequent PVCs. He was admitted to the hospital in 22/2979 with flu complicated by cardiogenic shock and VF arrest. He was transferred to [facility] where he was started on NE and milrinone infusions. He had multiple episodes of VT and ultimately underwent ICD placement following cardiac MRI that showed an EF of 16%. He has been managed by the advanced heart failure team, EP, and general cardiology. His PVCs have been felt to be playing a role in his cardiomyopathy. He has been managed on amiodarone and mexiletine. He underwent recent EPS with ablation on 06/10/2018 by Dr. Rayann Heman. At that time his PVCs were of RBB superior axis with the PVC being mapped to the inferolateral basal left ventricle. Extensive ablation was performed in this location. He had significant reduction but not elimination of PVCs. There was question of possible epicardial source. Following this procedure, he felt remarkably well and was without any complications.   He was at a store with a friend this morning when he was sitting a table and suddenly suffered LOC. There were no preceding symptoms. Patient is uncertain how long  he was with LOC. There was no loss of bowel or bladder function. Upon regaining consciousness, he reported feeling sore in his chest, "like I was congested." He was brought to the [facility] ED where he was noted to have frequent ventricular ectopy on 12-lead EKG and on telemetry. He was noted to be in sinus rhythm with frequent PVCs and nonspecific IVCD. Troponin was 0.08, potassium 3.6, magnesium 1.9, CBC unremarkable, BNP 549. BP remained stable. He was asymptomatic in the ED. He was given IV potassium. Currently, asymptomatic, sitting up in bed. Device interrogation showed 1 episode classified as VF treated with ATP without shock. Case was discussed with EP over the phone on 7/1 with recommendation to increase mexiletine to 200 mg bid. He was continued on amiodarone. This morning, BP is soft which has precluded continuation of his evidence-based heart failure therapy. He has continued to have frequent PVCs on telemetry. Troponin peaked at 0.10. TSH, CBC unremarkable.    Past Medical History:  Diagnosis Date  . AICD (automatic cardioverter/defibrillator) present   . AKI (acute kidney injury) ([place]) 12/24/2017  . Arrhythmia   . Arthritis    "lower back, knees" (06/10/2018)  . Cardiac arrest ([place]) 12/23/2017   Brief V-fib arrest  . Chest pain 09/08/2017  . CHF (congestive heart failure) (H[facility])    nonischemic cardiomyopathy, EF 25%  . Gout    "on daily RX" (06/10/2018)  . Headache    "q couple months" (06/10/2018)  . High cholesterol   . History of kidney stones   . Hypertension   . Influenza  A 12/24/2017  . NICM (nonischemic cardiomyopathy) ([place])   . Pneumonia 12/20/2017  . TIA (transient ischemic attack) 06/21/2016   "still affects my memory a little bit" (06/10/2018)    Past Surgical History:  Procedure Laterality Date  . EXTERNAL FIXATION LEG Right ~ 2000   "was going bowlegged; had to brake my leg to fix it"  . HERNIA REPAIR    . ICD IMPLANT N/A 12/30/2017   Procedure: ICD IMPLANT;   Surgeon: Deboraha Sprang, MD;  Location: [place] [facility];  Service: Cardiovascular;  Laterality: N/A;  . LEFT HEART CATH AND CORONARY ANGIOGRAPHY N/A 09/09/2017   Procedure: LEFT HEART CATH AND CORONARY ANGIOGRAPHY;  Surgeon: Teodoro Spray, MD;  Location: [place] [facility];  Service: Cardiovascular;  Laterality: N/A;  . UMBILICAL HERNIA REPAIR  1990s  . V TACH ABLATION  06/10/2018  . V TACH ABLATION N/A 06/10/2018   Procedure: V TACH ABLATION;  Surgeon: Thompson Grayer, MD;  Location: [place] [facility];  Service: Cardiovascular;  Laterality: N/A;  . VASECTOMY       Home Meds: Prior to Admission medications   Medication Sig Start Date End Date Taking? Authorizing Provider  acetaminophen (TYLENOL) 325 MG tablet Take 2 tablets (650 mg total) by mouth every 6 (six) hours as needed. Patient taking differently: Take 325-650 mg by mouth every 6 (six) hours as needed for moderate pain.  03/19/18  Yes Carrie Mew, MD  albuterol (PROVENTIL HFA;VENTOLIN HFA) 108 (90 Base) MCG/ACT inhaler Inhale 2 puffs into the lungs every 4 (four) hours as needed for wheezing or shortness of breath. 03/22/17  Yes [name], MD  amiodarone (PACERONE) 200 MG tablet Take 400 mg by mouth daily.    Yes [provider]  aspirin EC 81 MG EC tablet Take 1 tablet (81 mg total) by mouth daily. 01/01/18  Yes Daune Perch, NP  atorvastatin (LIPITOR) 80 MG tablet Take 1 tablet (80 mg total) by mouth daily at 6 PM. Patient taking differently: Take 80 mg by mouth at bedtime.  05/19/18  Yes Johnson, Megan P, DO  Camphor-Eucalyptus-Menthol (CHEST RUB) 4.8-1.2-2.6 % OINT Apply 1 application topically daily as needed (congestion).    Yes [provider]  carvedilol (COREG) 6.25 MG tablet Take 1 tablet (6.25 mg total) by mouth 2 (two) times daily with a meal. 01/09/18  Yes Tillery, Satira Mccallum, PA-C  clopidogrel (PLAVIX) 75 MG tablet Take 1 tablet (75 mg total) by mouth daily. 01/30/18  Yes Bensimhon, Shaune Pascal, MD  colchicine 0.6 MG tablet Take 0.6 mg by mouth daily as needed (gout flare).    Yes [provider]  diclofenac sodium (VOLTAREN) 1 % GEL Apply 2 g topically 4 (four) times daily as needed (pain).    Yes [provider]  febuxostat (ULORIC) 40 MG tablet Take 1 tablet (40 mg total) by mouth daily. 05/19/18  Yes Johnson, Megan P, DO  fluticasone (FLONASE) 50 MCG/ACT nasal spray Place 2 sprays into both nostrils daily. Patient taking differently: Place 2 sprays into both nostrils daily as needed for allergies.  06/20/17  Yes [name], MD  Menthol, Topical Analgesic, (ICY HOT EX) Apply 1 application topically daily as needed (pain).   Yes [provider]  metolazone (ZAROXOLYN) 2.5 MG tablet Take 2.5 mg by mouth 2 (two) times daily.   Yes [provider]  mexiletine (MEXITIL) 150 MG capsule Take 1 capsule (150 mg total) by mouth 2 (two) times daily. 02/18/18  Yes Caryl Comes,  Revonda Standard, MD  Multiple Vitamin (MULTIVITAMIN WITH MINERALS) TABS tablet Take 1 tablet by mouth daily.   Yes [provider]  nitroGLYCERIN (NITROSTAT) 0.4 MG SL tablet Place 1 tablet (0.4 mg total) under the tongue every 5 (five) minutes as needed for chest pain. 02/19/18  Yes Bensimhon, Shaune Pascal, MD  potassium chloride SA (K-DUR,KLOR-CON) 20 MEQ tablet Take 2 tablets (40 mEq total) by mouth 2 (two) times daily. 06/11/18  Yes Baldwin Jamaica, PA-C  sacubitril-valsartan (ENTRESTO) 49-51 MG Take 1 tablet by mouth 2 (two) times daily. 03/26/18  Yes Bensimhon, Shaune Pascal, MD  spironolactone (ALDACTONE) 25 MG tablet Take 1 tablet (25 mg total) by mouth daily. 01/01/18  Yes Daune Perch, NP  Tiotropium Bromide Monohydrate (SPIRIVA RESPIMAT) 2.5 MCG/ACT AERS Inhale 1 puff into the lungs daily.    Yes [provider]  torsemide (DEMADEX) 20 MG tablet Take 2 tablets (40 mg total) by mouth 2 (two) times daily. 03/26/18  Yes Bensimhon, Shaune Pascal, MD  hydrocortisone (ANUSOL-HC) 25 MG  suppository Place 1 suppository (25 mg total) rectally 2 (two) times daily. Patient not taking: Reported on 06/30/2018 06/23/18   Valerie Roys, DO    Inpatient Medications: Scheduled Meds: . amiodarone  400 mg Oral Daily  . aspirin EC  81 mg Oral Daily  . atorvastatin  80 mg Oral QHS  . carvedilol  6.25 mg Oral BID WC  . clopidogrel  75 mg Oral Daily  . enoxaparin (LOVENOX) injection  40 mg Subcutaneous Q24H  . febuxostat  40 mg Oral Daily  . metolazone  2.5 mg Oral BID  . mexiletine  200 mg Oral BID  . multivitamin with minerals  1 tablet Oral Daily  . potassium chloride SA  40 mEq Oral BID  . sacubitril-valsartan  1 tablet Oral BID  . spironolactone  25 mg Oral Daily  . tiotropium  1 capsule Inhalation Daily  . torsemide  40 mg Oral BID   Continuous Infusions:  PRN Meds: acetaminophen, colchicine, ondansetron **OR** ondansetron (ZOFRAN) IV  Allergies:   Allergies  Allergen Reactions  . Lisinopril Cough    Social History:   Social History   Socioeconomic History  . Marital status: Divorced    Spouse name: Not on file  . Number of children: Not on file  . Years of education: Not on file  . Highest education level: Not on file  Occupational History  . Not on file  Social Needs  . Financial resource strain: Not on file  . Food insecurity:    Worry: Not on file    Inability: Not on file  . Transportation needs:    Medical: Not on file    Non-medical: Not on file  Tobacco Use  . Smoking status: Former Smoker    Packs/day: 0.33    Years: 33.00    Pack years: 10.89    Types: Cigarettes    Last attempt to quit: 02/20/2016    Years since quitting: 2.3  . Smokeless tobacco: Never Used  Substance and Sexual Activity  . Alcohol use: Yes    Alcohol/week: 0.0 oz    Comment: 06/10/2018 "beer once/month; if that"  . Drug use: Never  . Sexual activity: Yes  Lifestyle  . Physical activity:    Days per week: Not on file    Minutes per session: Not on file  .  Stress: Not on file  Relationships  . Social connections:    Talks on phone: Not on file  Gets together: Not on file    Attends religious service: Not on file    Active member of club or organization: Not on file    Attends meetings of clubs or organizations: Not on file    Relationship status: Not on file  . Intimate partner violence:    Fear of current or ex partner: Not on file    Emotionally abused: Not on file    Physically abused: Not on file    Forced sexual activity: Not on file  Other Topics Concern  . Not on file  Social History Narrative   Independent at baseline, ambulates steadily     Family History:   Family History  Problem Relation Age of Onset  . Hypertension Mother   . Heart failure Mother   . Hypertension Father   . CAD Father   . Heart attack Father     ROS:  Review of Systems  Constitutional: Positive for malaise/fatigue. Negative for chills, diaphoresis, fever and weight loss.  HENT: Negative for congestion.   Eyes: Negative for discharge and redness.  Respiratory: Negative for cough, hemoptysis, sputum production, shortness of breath and wheezing.   Cardiovascular: Negative for chest pain, palpitations, orthopnea, claudication, leg swelling and PND.  Gastrointestinal: Negative for abdominal pain, blood in stool, heartburn, melena, nausea and vomiting.  Genitourinary: Negative for hematuria.  Musculoskeletal: Negative for falls and myalgias.  Skin: Negative for rash.  Neurological: Positive for loss of consciousness and weakness. Negative for dizziness, tingling, tremors, sensory change, speech change and focal weakness.  Endo/Heme/Allergies: Does not bruise/bleed easily.  Psychiatric/Behavioral: Negative for substance abuse. The patient is not nervous/anxious.   All other systems reviewed and are negative.     Physical Exam/Data:   Vitals:   07/01/18 0331 07/01/18 0336 07/01/18 0814 07/01/18 0925  BP: (!) 89/75 94/70 (!) 91/58 103/61    Pulse: 84 88 80 (!) 32  Resp: 17  16 18   Temp: 98.2 F (36.8 C)  98.2 F (36.8 C)   TempSrc: Oral  Oral   SpO2: 98%  100% 98%  Weight:      Height:        Intake/Output Summary (Last 24 hours) at 07/01/2018 1146 Last data filed at 07/01/2018 0900 Gross per 24 hour  Intake 340 ml  Output 850 ml  Net -510 ml   Filed Weights   06/30/18 1452  Weight: 236 lb (107 kg)   Body mass index is 31.14 kg/m.   Physical Exam: General: Well developed, well nourished, in no acute distress. Head: Normocephalic, atraumatic, sclera non-icteric, no xanthomas, nares without discharge.  Neck: Negative for carotid bruits. JVD not elevated. Lungs: Clear bilaterally to auscultation without wheezes, rales, or rhonchi. Breathing is unlabored. Heart: Irregualr with S1 S2. No murmurs, rubs, or gallops appreciated. Abdomen: Soft, non-tender, non-distended with normoactive bowel sounds. No hepatomegaly. No rebound/guarding. No obvious abdominal masses. Msk:  Strength and tone appear normal for age. Extremities: No clubbing or cyanosis. No edema. Distal pedal pulses are 2+ and equal bilaterally. Neuro: Alert and oriented X 3. No facial asymmetry. No focal deficit. Moves all extremities spontaneously. Psych:  Responds to questions appropriately with a normal affect.   EKG:  The EKG was personally reviewed and demonstrates: sinus tachycardia, 101 bpm, left axis, nonspecific IVCD, frequent PVCs in a pattern of ventricular bigeminy Telemetry:  Telemetry was personally reviewed and demonstrates: NSR, IVCD, frequent PVCs   Weights: Filed Weights   06/30/18 1452  Weight: 236 lb (107 kg)  Relevant CV Studies: EPS with ablation 06/10/2018: Conclusion   SURGEON: Thompson Grayer, MD.  PREPROCEDURE DIAGNOSIS: Ventricular tachycardia, frequent premature ventricular contractions  POSTPROCEDURE DIAGNOSIS:frequent premature ventricular contractions arising from the inferolateral basal left  ventricle  PROCEDURE: 1. Comprehensive EP study. 2. Coronary sinus pacing and recording. 3. Left atrial and left ventricular pacing and recording. 4. A 3D mapping of ventricular tachycardia. 5. Radiofrequency ablation of ventricular tachycardia. 6. Arterial blood pressure monitoring. 7. single-chamber defibrillator, interrogation, and reprogramming.     Echo 04/2018: Study Conclusions  - Left ventricle: The cavity size was severely dilated. Wall thickness was normal. Systolic function was severely reduced. The estimated ejection fraction was in the range of 10% to 15%. Severe diffuse hypokinesis with regional variations. The study is not technically sufficient to allow evaluation of LV diastolic function. No evidence of thrombus. - Ventricular septum: Septal motion showed abnormal function, dyssynergy, and paradox. - Mitral valve: There was mild regurgitation. - Left atrium: The atrium was moderately dilated. - Right atrium: The atrium was mildly dilated.  Impressions:  - Doppler signs of severely reduced cardiac output are present. Persistent ventricular bigeminy with ineffective PVCs noted (&quot;effective heart rate&quot; 25 bpm).   Echo 06/30/2018: Study Conclusions  - Left ventricle: The cavity size was severely dilated. Wall   thickness was increased in a pattern of mild LVH. The estimated   ejection fraction was in the range of 10% to 15%. Diffuse   hypokinesis. Regional wall motion abnormalities cannot be   excluded. The study is not technically sufficient to allow   evaluation of LV diastolic function. - Aortic valve: There was trivial regurgitation. - Mitral valve: There was mild regurgitation. - Left atrium: The atrium was moderately dilated. - Right ventricle: The cavity size was mildly dilated. Wall   thickness was mildly increased. Systolic function was normal. - Right atrium: The atrium was mildly dilated.   Laboratory  Data:  Chemistry Recent Labs  Lab 06/30/18 1503  NA 139  K 3.6  CL 101  CO2 24  GLUCOSE 153*  BUN 31*  CREATININE 1.88*  CALCIUM 9.3  GFRNONAA 39*  GFRAA 45*  ANIONGAP 14    No results for input(s): PROT, ALBUMIN, AST, ALT, ALKPHOS, BILITOT in the last 168 hours. Hematology Recent Labs  Lab 06/30/18 1503 07/01/18 0559  WBC 9.6 9.0  RBC 5.51 4.90  HGB 16.1 14.6  HCT 48.2 42.5  MCV 87.4 86.7  MCH 29.3 29.7  MCHC 33.5 34.3  RDW 14.2 14.1  PLT 249 213   Cardiac Enzymes Recent Labs  Lab 06/30/18 1505 06/30/18 1905 07/01/18 0013 07/01/18 0559  TROPONINI 0.08* 0.10* 0.10* 0.09*   No results for input(s): TROPIPOC in the last 168 hours.  BNP Recent Labs  Lab 06/30/18 1503  BNP 549.0*    DDimer No results for input(s): DDIMER in the last 168 hours.  Radiology/Studies:  Dg Chest Portable 1 View  Result Date: 06/30/2018 IMPRESSION: Stable moderate cardiomegaly.  No edema or effusion. Electronically Signed   By: Titus Dubin M.D.   On: 06/30/2018 16:02    Assessment and Plan:   1. Syncope: -Review of device interrogation shows 1 episode of classified as VF treated with ATP with shock aborted  -[name] recommends increasing mexiletine to 300 mg bid -Add Ranexa 500 mg bid -Continue amiodarone 200 mg daily -If the patient continues to have frequent ventricular ectopy, he may require transfer to [facility] for advanced heart failure therapy +/- VAD/transplant -Replete potassium and magnesium to  goal of > 4.0 and > 2.0 respectively  -Continue Coreg -Telemetry  2. Abnormal EKG/frequent PVCs: -As above  3. NICM: -He does not appear grossly volume up at this time -Continue Coreg, Entresto, spironolactone, torsemide, metolazone as BP allows -Recent echo as above  4. History of VT/NSVT: -Status post MDT ICD as above -Continue current medications as above -Replete lytes -TSH normal  5. Elevated troponin: -Likely in the setting of the above -Recent LHC  with normal coronary arteries -Cycle to rule out -No indication for heparin gtt at this time unless there is dynamic troponin elevation -TTE as above     For questions or updates, please contact [facility] Please consult [email or web address] for contact info under Cardiology/STEMI.   Signed, Christell Faith, PA-C [place] Pager: [phone] 07/01/2018, 11:46 AM  Nonischemic cardiomyopathy  Cardiac arrest-aborted  Ventricular tachycardia-recurrent-treated  ICD-[employer]  Ventricular ectopy-complex infrequent status post ablation with possible epicardial source  Congestive heart failure-chronic-class III   Recurrent symptomatic life-threatening arrhythmias in the context of not improving cardiomyopathy and failed PVC ablation.  For now we will increase his antiarrhythmic therapy, increasing mexiletine from 150--300 mg twice daily and adding ranolazine 500 twice daily.    This may or may not have an impact on his PVC burden; we will watch him in hospital for about 48 hours to see if we can demonstrate a reduction.  I have discussed this with Dr. Greggory Brandy today, and he will be seeing him next week.  Given his young age, would favor referral for possible epicardial ablation as opposed to needing to accelerate options for advanced therapies through the heart failure clinic.  It certainly would be nice if we could reverse his cardiomyopathy.  He is euvolemic today, hence we will continue his other medications

## 2018-07-02 DIAGNOSIS — I428 Other cardiomyopathies: Secondary | ICD-10-CM | POA: Diagnosis not present

## 2018-07-02 DIAGNOSIS — I4901 Ventricular fibrillation: Secondary | ICD-10-CM

## 2018-07-02 DIAGNOSIS — R55 Syncope and collapse: Secondary | ICD-10-CM | POA: Diagnosis not present

## 2018-07-02 LAB — BASIC METABOLIC PANEL
Anion gap: 11 (ref 5–15)
BUN: 43 mg/dL — AB (ref 6–20)
CALCIUM: 9.4 mg/dL (ref 8.9–10.3)
CO2: 26 mmol/L (ref 22–32)
Chloride: 101 mmol/L (ref 98–111)
Creatinine, Ser: 1.79 mg/dL — ABNORMAL HIGH (ref 0.61–1.24)
GFR calc Af Amer: 48 mL/min — ABNORMAL LOW (ref >60)
GFR calc non Af Amer: 42 mL/min — ABNORMAL LOW (ref >60)
GLUCOSE: 118 mg/dL — AB (ref 70–99)
Potassium: 3.7 mmol/L (ref 3.5–5.1)
Sodium: 138 mmol/L (ref 135–145)

## 2018-07-02 LAB — MAGNESIUM: Magnesium: 2.2 mg/dL (ref 1.7–2.4)

## 2018-07-02 MED ORDER — MEXILETINE HCL 150 MG PO CAPS
300.0000 mg | ORAL_CAPSULE | Freq: Two times a day (BID) | ORAL | Status: DC
  Administered 2018-07-02 – 2018-07-06 (×9): 300 mg via ORAL
  Filled 2018-07-02 (×11): qty 2

## 2018-07-02 MED ORDER — SPIRONOLACTONE 25 MG PO TABS
12.5000 mg | ORAL_TABLET | Freq: Every day | ORAL | Status: DC
  Administered 2018-07-02 – 2018-07-03 (×2): 12.5 mg via ORAL
  Filled 2018-07-02 (×2): qty 0.5
  Filled 2018-07-02: qty 1
  Filled 2018-07-02: qty 0.5
  Filled 2018-07-02: qty 1

## 2018-07-02 MED ORDER — SACUBITRIL-VALSARTAN 24-26 MG PO TABS
1.0000 | ORAL_TABLET | Freq: Two times a day (BID) | ORAL | Status: DC
  Administered 2018-07-02 – 2018-07-06 (×8): 1 via ORAL
  Filled 2018-07-02 (×8): qty 1

## 2018-07-02 MED ORDER — RANOLAZINE ER 500 MG PO TB12
500.0000 mg | ORAL_TABLET | Freq: Two times a day (BID) | ORAL | Status: DC
  Administered 2018-07-02 – 2018-07-05 (×6): 500 mg via ORAL
  Filled 2018-07-02 (×8): qty 1

## 2018-07-02 NOTE — Progress Notes (Signed)
Stephen [name] at [facility] NAME: Stephen [name]    MR#:  [id number]  DATE OF BIRTH:  06/10/1966  SUBJECTIVE:   Patient here due to Cardiogenic syncope.  No further episodes of syncope overnight.  No acute arrhythmias.  No chest pain, no other complaints.  REVIEW OF SYSTEMS:    Review of Systems  Constitutional: Negative for chills and fever.  HENT: Negative for congestion and tinnitus.   Eyes: Negative for blurred vision and double vision.  Respiratory: Negative for cough, shortness of breath and wheezing.   Cardiovascular: Negative for chest pain, orthopnea and PND.  Gastrointestinal: Negative for abdominal pain, diarrhea, nausea and vomiting.  Genitourinary: Negative for dysuria and hematuria.  Neurological: Negative for dizziness, sensory change and focal weakness.  All other systems reviewed and are negative.   Nutrition: Heart Healthy Tolerating Diet: Yes Tolerating PT: Ambulatory   DRUG ALLERGIES:   Allergies  Allergen Reactions  . Lisinopril Cough    VITALS:  Blood pressure 97/82, pulse 80, temperature (!) 97.5 F (36.4 C), temperature source Oral, resp. rate 18, height 6\' 1"  (1.854 m), weight 105.1 kg (231 lb 9.6 oz), SpO2 99 %.  PHYSICAL EXAMINATION:   Physical Exam  GENERAL:  52 y.o.-year-old patient lying in bed in no acute distress.  EYES: Pupils equal, round, reactive to light and accommodation. No scleral icterus. Extraocular muscles intact.  HEENT: Head atraumatic, normocephalic. Oropharynx and nasopharynx clear.  NECK:  Supple, no jugular venous distention. No thyroid enlargement, no tenderness.  LUNGS: Normal breath sounds bilaterally, no wheezing, rales, rhonchi. No use of accessory muscles of respiration.  CARDIOVASCULAR: S1, S2 normal. No murmurs, rubs, or gallops.  ABDOMEN: Soft, nontender, nondistended. Bowel sounds present. No organomegaly or mass.  EXTREMITIES: No cyanosis, clubbing or edema b/l.     NEUROLOGIC: Cranial nerves II through XII are intact. No focal Motor or sensory deficits b/l.   PSYCHIATRIC: The patient is alert and oriented x 3.  SKIN: No obvious rash, lesion, or ulcer.    LABORATORY PANEL:   CBC Recent Labs  Lab 07/01/18 0559  WBC 9.0  HGB 14.6  HCT 42.5  PLT 213   ------------------------------------------------------------------------------------------------------------------  Chemistries  Recent Labs  Lab 07/02/18 0744  NA 138  K 3.7  CL 101  CO2 26  GLUCOSE 118*  BUN 43*  CREATININE 1.79*  CALCIUM 9.4  MG 2.2   ------------------------------------------------------------------------------------------------------------------  Cardiac Enzymes Recent Labs  Lab 07/01/18 0559  TROPONINI 0.09*   ------------------------------------------------------------------------------------------------------------------  RADIOLOGY:  Dg Chest Portable 1 View  Result Date: 06/30/2018 CLINICAL DATA:  Syncope. EXAM: PORTABLE CHEST 1 VIEW COMPARISON:  Chest x-ray dated March 19, 2018. FINDINGS: Unchanged single lead left chest wall pacer device. Stable moderate cardiomegaly. Pulmonary vascular congestion has resolved. No focal consolidation, pleural effusion, or pneumothorax. No acute osseous abnormality. IMPRESSION: Stable moderate cardiomegaly.  No edema or effusion. Electronically Signed   By: Titus Dubin M.D.   On: 06/30/2018 16:02     ASSESSMENT AND PLAN:   52 year old male with past medical history of nonischemic cardiomyopathy status post AICD, hypertension, previous TIA, history of ventricular tachycardia, gout who presented to the hospital due to a syncopal episode.  1.  Syncope-concern for cardiogenic cause.  Especially with multiple PVCs and hx of VF/VT - Seen by cardiology/electrophysiology and AICD was interrogated which shows evidence of VF.   - As per EP cont. Higher dose mexiletine, continue amiodarone.  No events overnight.  If no  further  episodes likely discharge on the higher dose of medications but if continues to have symptoms patient will likely need to be transferred to  [facility] for advanced heart failure therapy +/- VAD/transplant - cont. Mexilitine, Amiodarone for now. - as per Cards keep K > 4 and Mg. > 2.    2.  Nonischemic cardiomyopathy-systolic CHF- well compensated - cont. Coreg, Entresto, Aldactone, torsemide.  Echocardiogram done showing severe dilated cardiomyopathy EF of 10 to 15% with diffuse hypokinesis.  This is no change from his previous echo.  3.  History of CVA-on aspirin, statin and Plavix.  No residual deficits  4.  Gout- cont. colchicine, Uloric - no acute attack.   5. COPD - acute exacerbation.  - cont. Spiriva.   Likely discharge home tomorrow.   All the records are reviewed and case discussed with Care Management/Social Worker. Management plans discussed with the patient, family and they are in agreement.  CODE STATUS: Full code  DVT Prophylaxis: Lovenox  TOTAL TIME TAKING CARE OF THIS PATIENT: 25 minutes.   POSSIBLE D/C IN 1-2 DAYS, DEPENDING ON CLINICAL CONDITION.   Henreitta Leber M.D on 07/02/2018 at 2:16 PM  Between 7am to 6pm - Pager - [phone]  After 6pm go to [email or web address] - [employer] [facility]  Office  [phone]  CC: Primary care physician; Valerie Roys, DO

## 2018-07-02 NOTE — Progress Notes (Signed)
Progress Note  Patient Name: Stephen [name] Date of Encounter: 07/02/2018  Primary Cardiologist:Arida Primary Electrophysiologist:Klein Primary Advanced Heart Failure:Bensimhon   Subjective   Feels well this morning. No further syncopal episodes. No shocks from ICD. Tolerating Ranexa 500 mg bid. No labs this morning. Scheduled to see EP on 7/10 at 2 PM.   Inpatient Medications    Scheduled Meds: . amiodarone  400 mg Oral Daily  . aspirin EC  81 mg Oral Daily  . atorvastatin  80 mg Oral QHS  . carvedilol  3.125 mg Oral BID WC  . clopidogrel  75 mg Oral Daily  . enoxaparin (LOVENOX) injection  40 mg Subcutaneous Q24H  . febuxostat  40 mg Oral Daily  . metolazone  2.5 mg Oral BID  . mexiletine  200 mg Oral BID  . multivitamin with minerals  1 tablet Oral Daily  . potassium chloride SA  40 mEq Oral BID  . sacubitril-valsartan  1 tablet Oral BID  . sodium chloride flush  3 mL Intravenous Q12H  . spironolactone  25 mg Oral Daily  . tiotropium  1 capsule Inhalation Daily  . torsemide  40 mg Oral BID   Continuous Infusions:  PRN Meds: acetaminophen, colchicine, ondansetron **OR** ondansetron (ZOFRAN) IV   Vital Signs    Vitals:   07/01/18 1902 07/01/18 1936 07/02/18 0448 07/02/18 0525  BP:  110/82 (!) 71/42 97/61  Pulse:  94 (!) 107 93  Resp:  17 17   Temp:  98.3 F (36.8 C) 98.5 F (36.9 C)   TempSrc:  Oral Oral   SpO2:  99% 93%   Weight: 234 lb 12.8 oz (106.5 kg)  231 lb 9.6 oz (105.1 kg)   Height:        Intake/Output Summary (Last 24 hours) at 07/02/2018 4098 Last data filed at 07/02/2018 0400 Gross per 24 hour  Intake 600 ml  Output 1150 ml  Net -550 ml   Filed Weights   06/30/18 1452 07/01/18 1902 07/02/18 0448  Weight: 236 lb (107 kg) 234 lb 12.8 oz (106.5 kg) 231 lb 9.6 oz (105.1 kg)    Telemetry    NSR with BBB, improved PVCs, though still with occasional PVCs intermittently in a pattern of ventricular trigeminy, rare ventricular  couplets/triplets - Personally Reviewed  ECG    n/a - Personally Reviewed  Physical Exam   GEN: No acute distress.   Neck: No JVD. Cardiac: RRR, no murmurs, rubs, or gallops.  Respiratory: Clear to auscultation bilaterally.  GI: Soft, nontender, non-distended.   MS: No edema; No deformity. Neuro:  Alert and oriented x 3; Nonfocal.  Psych: Normal affect.  Labs    Chemistry Recent Labs  Lab 06/30/18 1503  NA 139  K 3.6  CL 101  CO2 24  GLUCOSE 153*  BUN 31*  CREATININE 1.88*  CALCIUM 9.3  GFRNONAA 39*  GFRAA 45*  ANIONGAP 14     Hematology Recent Labs  Lab 06/30/18 1503 07/01/18 0559  WBC 9.6 9.0  RBC 5.51 4.90  HGB 16.1 14.6  HCT 48.2 42.5  MCV 87.4 86.7  MCH 29.3 29.7  MCHC 33.5 34.3  RDW 14.2 14.1  PLT 249 213    Cardiac Enzymes Recent Labs  Lab 06/30/18 1505 06/30/18 1905 07/01/18 0013 07/01/18 0559  TROPONINI 0.08* 0.10* 0.10* 0.09*   No results for input(s): TROPIPOC in the last 168 hours.   BNP Recent Labs  Lab 06/30/18 1503  BNP 549.0*  DDimer No results for input(s): DDIMER in the last 168 hours.   Radiology    Dg Chest Portable 1 View  Result Date: 06/30/2018 IMPRESSION: Stable moderate cardiomegaly.  No edema or effusion. Electronically Signed   By: Titus Dubin M.D.   On: 06/30/2018 16:02    Cardiac Studies   EPS with ablation 06/10/2018: Conclusion   SURGEON: Thompson Grayer, MD.  PREPROCEDURE DIAGNOSIS: Ventricular tachycardia, frequent premature ventricular contractions  POSTPROCEDURE DIAGNOSIS:frequent premature ventricular contractions arising from the inferolateral basal left ventricle  PROCEDURE: 1. Comprehensive EP study. 2. Coronary sinus pacing and recording. 3. Left atrial and left ventricular pacing and recording. 4. A 3D mapping of ventricular tachycardia. 5. Radiofrequency ablation of ventricular tachycardia. 6. Arterial blood pressure monitoring. 7. single-chamber defibrillator,  interrogation, and reprogramming.     Echo 04/2018: Study Conclusions  - Left ventricle: The cavity size was severely dilated. Wall thickness was normal. Systolic function was severely reduced. The estimated ejection fraction was in the range of 10% to 15%. Severe diffuse hypokinesis with regional variations. The study is not technically sufficient to allow evaluation of LV diastolic function. No evidence of thrombus. - Ventricular septum: Septal motion showed abnormal function, dyssynergy, and paradox. - Mitral valve: There was mild regurgitation. - Left atrium: The atrium was moderately dilated. - Right atrium: The atrium was mildly dilated.  Impressions:  - Doppler signs of severely reduced cardiac output are present. Persistent ventricular bigeminy with ineffective PVCs noted (&quot;effective heart rate&quot; 25 bpm).   Echo 06/30/2018: Study Conclusions  - Left ventricle: The cavity size was severely dilated. Wall thickness was increased in a pattern of mild LVH. The estimated ejection fraction was in the range of 10% to 15%. Diffuse hypokinesis. Regional wall motion abnormalities cannot be excluded. The study is not technically sufficient to allow evaluation of LV diastolic function. - Aortic valve: There was trivial regurgitation. - Mitral valve: There was mild regurgitation. - Left atrium: The atrium was moderately dilated. - Right ventricle: The cavity size was mildly dilated. Wall thickness was mildly increased. Systolic function was normal. - Right atrium: The atrium was mildly dilated.   Patient Profile     52 y.o. male with history of normal coronary arteries by LHC in 3536, chronic systolic CHF due to NICM, frequent PVCs, VT/NSVT/VF arrest s/p MDT ICD s/p EPS with ablation 06/10/2018, TIA in 2017 on Plavix, prior tobacco abuse, and HTNwho is being seen today for the evaluation of syncope/abnormal EKGat the request of Dr.  Tressia Miners.  Assessment & Plan    1. Syncope/cardiac arrest: -Review of device interrogation shows 1 episode of classified as VF treated with ATP with shock aborted  -[name] recommends increasing mexiletine to 300 mg bid -Continue Ranexa 500 mg bid -Continue amiodarone 200 mg daily -If the patient continues to have frequent ventricular ectopy, he may require transfer to [facility] for possible epicardial ablation vs advanced heart failure therapy +/- VAD/transplant (would prefer to treat underlying etiology initially) -Replete potassium and magnesium to goal of >4.0 and >2.0 respectively  -Continue Coreg as BP allows (has not received since admission given hypotension) -Telemetry  2. Abnormal EKG/frequent PVCs: -PVC burden is improving with changes as above  3. NICM: -He does not appear grossly volume up at this time -Continue Coreg, Entresto, spironolactone, torsemide, metolazone as BP allows (has not received this admission given hypotension) -Recent echo as above  4. History of VT/NSVT: -Status post MDT ICD as above -Continue current medications as above -Replete lytes -TSH normal  5. Elevated troponin: -Likely in the setting of the above -Recent LHC with normal coronary arteries -Cycle to rule out -No indication for heparin gtt at this time unless there is dynamic troponin elevation -TTE as above    For questions or updates, please contact [facility] Please consult [email or web address] for contact info under Cardiology/STEMI.    Signed, Christell Faith, PA-C [place] Pager: [phone] 07/02/2018, 7:27 AM

## 2018-07-02 NOTE — Plan of Care (Signed)
Rounded with MD, patient resting in room comfortably, no complains of pain during this shift, pt ambulated around nurses stationx3, and tolerated well. Will continue to monitor.  Problem: Education: Goal: Knowledge of General Education information will improve Outcome: Progressing   Problem: Clinical Measurements: Goal: Ability to maintain clinical measurements within normal limits will improve Outcome: Progressing   Problem: Skin Integrity: Goal: Risk for impaired skin integrity will decrease Outcome: Progressing

## 2018-07-03 DIAGNOSIS — I4901 Ventricular fibrillation: Secondary | ICD-10-CM | POA: Diagnosis not present

## 2018-07-03 DIAGNOSIS — I428 Other cardiomyopathies: Secondary | ICD-10-CM | POA: Diagnosis not present

## 2018-07-03 DIAGNOSIS — I493 Ventricular premature depolarization: Secondary | ICD-10-CM | POA: Diagnosis not present

## 2018-07-03 DIAGNOSIS — R55 Syncope and collapse: Secondary | ICD-10-CM | POA: Diagnosis not present

## 2018-07-03 NOTE — Progress Notes (Addendum)
Progress Note  Patient Name: Stephen [name] Date of Encounter: 07/03/2018  Primary Cardiologist:Arida Primary Electrophysiologist:Klein Primary Advanced Heart Failure:Bensimhon   Subjective   Feels well this morning.   gurgling in abdomen left upper quadrant, discussed with medicine service No syncope or near syncope  telemetry reviewed showing no significant arrhythmia, He does have frequent PVCs Blood pressure low but stable  Inpatient Medications    Scheduled Meds: . amiodarone  400 mg Oral Daily  . aspirin EC  81 mg Oral Daily  . atorvastatin  80 mg Oral QHS  . carvedilol  3.125 mg Oral BID WC  . clopidogrel  75 mg Oral Daily  . enoxaparin (LOVENOX) injection  40 mg Subcutaneous Q24H  . febuxostat  40 mg Oral Daily  . metolazone  2.5 mg Oral BID  . mexiletine  300 mg Oral BID  . multivitamin with minerals  1 tablet Oral Daily  . potassium chloride SA  40 mEq Oral BID  . ranolazine  500 mg Oral BID  . sacubitril-valsartan  1 tablet Oral BID  . sodium chloride flush  3 mL Intravenous Q12H  . spironolactone  12.5 mg Oral Daily  . tiotropium  1 capsule Inhalation Daily  . torsemide  40 mg Oral BID   Continuous Infusions:  PRN Meds: acetaminophen, colchicine, ondansetron **OR** ondansetron (ZOFRAN) IV   Vital Signs    Vitals:   07/03/18 0505 07/03/18 0742 07/03/18 0744 07/03/18 0748  BP: 94/74 92/75 96/73  97/80  Pulse: 68 70 75 65  Resp:  18    Temp: (!) 97.5 F (36.4 C) 97.6 F (36.4 C)    TempSrc: Oral Oral    SpO2: 100% 95% 100% 98%  Weight: 234 lb 3.2 oz (106.2 kg)     Height:        Intake/Output Summary (Last 24 hours) at 07/03/2018 1252 Last data filed at 07/03/2018 1217 Gross per 24 hour  Intake 600 ml  Output 1725 ml  Net -1125 ml   Filed Weights   07/01/18 1902 07/02/18 0448 07/03/18 0505  Weight: 234 lb 12.8 oz (106.5 kg) 231 lb 9.6 oz (105.1 kg) 234 lb 3.2 oz (106.2 kg)    Telemetry    NSR with BBB, improved PVCs, though  still with occasional PVCs intermittently in a pattern of ventricular trigeminy, rare ventricular couplets/triplets - Personally Reviewed  ECG    n/a - Personally Reviewed  Physical Exam   Constitutional:  oriented to person, place, and time. No distress.  HENT:  Head: Normocephalic and atraumatic.  Eyes:  no discharge. No scleral icterus.  Neck: Normal range of motion. Neck supple. JVD present 10+.  Cardiovascular: Normal rate, regular rhythm, normal heart sounds and intact distal pulses. Exam reveals no gallop and no friction rub. No edema No murmur heard. Pulmonary/Chest: Effort normal and breath sounds normal. No stridor. No respiratory distress.  no wheezes.  no rales.  no tenderness.  Abdominal: Soft.  no distension.  no tenderness. gurgling abdominal sounds left upper quadrant on expiration Musculoskeletal: Normal range of motion.  no  tenderness or deformity.  Neurological:  normal muscle tone. Coordination normal. No atrophy Skin: Skin is warm and dry. No rash noted. not diaphoretic.  Psychiatric:  normal mood and affect. behavior is normal. Thought content normal.    Labs    Chemistry Recent Labs  Lab 06/30/18 1503 07/02/18 0744  NA 139 138  K 3.6 3.7  CL 101 101  CO2 24 26  GLUCOSE  153* 118*  BUN 31* 43*  CREATININE 1.88* 1.79*  CALCIUM 9.3 9.4  GFRNONAA 39* 42*  GFRAA 45* 48*  ANIONGAP 14 11     Hematology Recent Labs  Lab 06/30/18 1503 07/01/18 0559  WBC 9.6 9.0  RBC 5.51 4.90  HGB 16.1 14.6  HCT 48.2 42.5  MCV 87.4 86.7  MCH 29.3 29.7  MCHC 33.5 34.3  RDW 14.2 14.1  PLT 249 213    Cardiac Enzymes Recent Labs  Lab 06/30/18 1505 06/30/18 1905 07/01/18 0013 07/01/18 0559  TROPONINI 0.08* 0.10* 0.10* 0.09*   No results for input(s): TROPIPOC in the last 168 hours.   BNP Recent Labs  Lab 06/30/18 1503  BNP 549.0*     DDimer No results for input(s): DDIMER in the last 168 hours.   Radiology    Dg Chest Portable 1 View  Result  Date: 06/30/2018 IMPRESSION: Stable moderate cardiomegaly.  No edema or effusion. Electronically Signed   By: Titus Dubin M.D.   On: 06/30/2018 16:02    Cardiac Studies   EPS with ablation 06/10/2018: Conclusion   SURGEON: Thompson Grayer, MD.  PREPROCEDURE DIAGNOSIS: Ventricular tachycardia, frequent premature ventricular contractions  POSTPROCEDURE DIAGNOSIS:frequent premature ventricular contractions arising from the inferolateral basal left ventricle  PROCEDURE: 1. Comprehensive EP study. 2. Coronary sinus pacing and recording. 3. Left atrial and left ventricular pacing and recording. 4. A 3D mapping of ventricular tachycardia. 5. Radiofrequency ablation of ventricular tachycardia. 6. Arterial blood pressure monitoring. 7. single-chamber defibrillator, interrogation, and reprogramming.     Echo 04/2018: Study Conclusions  - Left ventricle: The cavity size was severely dilated. Wall thickness was normal. Systolic function was severely reduced. The estimated ejection fraction was in the range of 10% to 15%. Severe diffuse hypokinesis with regional variations. The study is not technically sufficient to allow evaluation of LV diastolic function. No evidence of thrombus. - Ventricular septum: Septal motion showed abnormal function, dyssynergy, and paradox. - Mitral valve: There was mild regurgitation. - Left atrium: The atrium was moderately dilated. - Right atrium: The atrium was mildly dilated.  Impressions:  - Doppler signs of severely reduced cardiac output are present. Persistent ventricular bigeminy with ineffective PVCs noted (&quot;effective heart rate&quot; 25 bpm).   Echo 06/30/2018: Study Conclusions  - Left ventricle: The cavity size was severely dilated. Wall thickness was increased in a pattern of mild LVH. The estimated ejection fraction was in the range of 10% to 15%. Diffuse hypokinesis. Regional wall motion  abnormalities cannot be excluded. The study is not technically sufficient to allow evaluation of LV diastolic function. - Aortic valve: There was trivial regurgitation. - Mitral valve: There was mild regurgitation. - Left atrium: The atrium was moderately dilated. - Right ventricle: The cavity size was mildly dilated. Wall thickness was mildly increased. Systolic function was normal. - Right atrium: The atrium was mildly dilated.   Patient Profile     52 y.o. male with history of normal coronary arteries by LHC in 6948, chronic systolic CHF due to NICM, frequent PVCs, VT/NSVT/VF arrest s/p MDT ICD s/p EPS with ablation 06/10/2018, TIA in 2017 on Plavix, prior tobacco abuse, and HTNwho is being seen today for the evaluation of syncope/abnormal EKGat the request of Dr. Tressia Miners.  Assessment & Plan    A/P: 1.Syncope/cardiac arrest: device interrogation shows 1 episode of classified as VF treated with ATP with shock aborted  mexiletine to 300 mg bid, Ranexa 500 mg bid, amiodarone 200 mg daily, carvedilol monitor on telemetry additional 24  hours -entresto  24/26 mg by mouth twice a day, Spironolactone 12.5 daily, Coreg 3.125 twice a day. Continue torsemide and metolazone --- would give medications despite low blood pressure If he continues to have ventricular arrhythmiaas outpatient, transfer to [facility]  forpossible epicardial ablation vsadvanced heart failure therapy +/- VAD/transplant  2. Abnormal EKG/frequent PVCs: -High PVC burden Continue current medications Advance carvedilol as blood pressure tolerates  3. NICM: -Continue Coreg, Entresto, spironolactone, torsemide, metolazone  4. History of VT/NSVT: -Status post MDT ICD  Scheduled to see EP on 7/10 at 2 PM.   5. Elevated troponin: LHC with normal coronary arteries  Case discussed with hospitalist service Long discussion with patient and with nursing Greater than 50% was spent in counseling and coordination  of care with patient Total encounter time 35 minutes or more  Signed, Esmond Plants, MD, Ph.D [facility]

## 2018-07-03 NOTE — Progress Notes (Signed)
Patient ID: Stephen [name], male   DOB: Jun 19, 1966, 52 y.o.   MRN: [id number]  [place] PROGRESS NOTE  ARIAS WEINERT PJK:[id number] DOB: May 10, 1966 DOA: 06/30/2018 PCP: Park Liter P, DO  HPI/Subjective: Patient feels okay.  Blood pressure low overnight and this morning.  Patient very concerned about this.  Objective: Vitals:   07/03/18 0744 07/03/18 0748  BP: 96/73 97/80  Pulse: 75 65  Resp:    Temp:    SpO2: 100% 98%    Filed Weights   07/01/18 1902 07/02/18 0448 07/03/18 0505  Weight: 106.5 kg (234 lb 12.8 oz) 105.1 kg (231 lb 9.6 oz) 106.2 kg (234 lb 3.2 oz)    ROS: Review of Systems  Constitutional: Negative for chills and fever.  Eyes: Negative for blurred vision.  Respiratory: Negative for cough and shortness of breath.   Cardiovascular: Negative for chest pain.  Gastrointestinal: Negative for abdominal pain, constipation, diarrhea, nausea and vomiting.  Genitourinary: Negative for dysuria.  Musculoskeletal: Negative for joint pain.  Neurological: Negative for dizziness and headaches.   Exam: Physical Exam  Constitutional: He is oriented to person, place, and time.  HENT:  Nose: No mucosal edema.  Mouth/Throat: No oropharyngeal exudate or posterior oropharyngeal edema.  Eyes: Pupils are equal, round, and reactive to light. Conjunctivae, EOM and lids are normal.  Neck: No JVD present. Carotid bruit is not present. No edema present. No thyroid mass and no thyromegaly present.  Cardiovascular: S1 normal and S2 normal. Exam reveals no gallop.  No murmur heard. Pulses:      Dorsalis pedis pulses are 2+ on the right side, and 2+ on the left side.  Respiratory: No respiratory distress. He has no wheezes. He has no rhonchi. He has no rales.  GI: Soft. Bowel sounds are normal. There is no tenderness.  Gurgling in the abdomen with expiration and turning.  Musculoskeletal:       Right ankle: He exhibits no swelling.       Left ankle: He exhibits no  swelling.  Lymphadenopathy:    He has no cervical adenopathy.  Neurological: He is alert and oriented to person, place, and time. No cranial nerve deficit.  Skin: Skin is warm. No rash noted. Nails show no clubbing.  Psychiatric: He has a normal mood and affect.      Data Reviewed: Basic Metabolic Panel: Recent Labs  Lab 06/30/18 1503 06/30/18 1515 07/02/18 0744  NA 139  --  138  K 3.6  --  3.7  CL 101  --  101  CO2 24  --  26  GLUCOSE 153*  --  118*  BUN 31*  --  43*  CREATININE 1.88*  --  1.79*  CALCIUM 9.3  --  9.4  MG  --  1.9 2.2   CBC: Recent Labs  Lab 06/30/18 1503 07/01/18 0559  WBC 9.6 9.0  HGB 16.1 14.6  HCT 48.2 42.5  MCV 87.4 86.7  PLT 249 213   Cardiac Enzymes: Recent Labs  Lab 06/30/18 1505 06/30/18 1905 07/01/18 0013 07/01/18 0559  TROPONINI 0.08* 0.10* 0.10* 0.09*   BNP (last 3 results) Recent Labs    12/24/17 0303 03/19/18 0931 06/30/18 1503  BNP 1,335.4* 1,184.0* 549.0*    CBG: Recent Labs  Lab 06/30/18 1459  GLUCAP 154*    Scheduled Meds: . amiodarone  400 mg Oral Daily  . aspirin EC  81 mg Oral Daily  . atorvastatin  80 mg Oral QHS  . carvedilol  3.125  mg Oral BID WC  . clopidogrel  75 mg Oral Daily  . enoxaparin (LOVENOX) injection  40 mg Subcutaneous Q24H  . febuxostat  40 mg Oral Daily  . metolazone  2.5 mg Oral BID  . mexiletine  300 mg Oral BID  . multivitamin with minerals  1 tablet Oral Daily  . potassium chloride SA  40 mEq Oral BID  . ranolazine  500 mg Oral BID  . sacubitril-valsartan  1 tablet Oral BID  . sodium chloride flush  3 mL Intravenous Q12H  . spironolactone  12.5 mg Oral Daily  . tiotropium  1 capsule Inhalation Daily  . torsemide  40 mg Oral BID   Continuous Infusions:  Assessment/Plan:  1. Hypotension.  Patient concerned and wanted to monitor his blood pressure another day.  Case discussed with Dr. Rockey Situ and okay to give him his medication.  Case discussed with nursing staff to give  medication and monitor.  Patient not orthostatic at this point. 2. Syncope with ventricular fibrillation.  Mexiletine increased to 300 mg twice daily.  Patient also on amiodarone Coreg and Spironolactone. 3. Nonischemic cardiomyopathy with low ejection fraction.  Continue Coreg, Entresto, Aldactone and torsemide 4. History of CVA on aspirin statin Plavix 5. History of gout on colchicine and Uloric 6. History of COPD on Spiriva   Code Status:     Code Status Orders  (From admission, onward)        Start     Ordered   06/30/18 1758  Full code  Continuous     06/30/18 1757    Code Status History    Date Active Date Inactive Code Status Order ID Comments User Context   06/10/2018 1606 06/11/2018 1537 Full Code [id number]  Thompson Grayer, MD Inpatient   12/23/2017 1153 12/31/2017 1729 Full Code [id number]  Ilean China Inpatient   12/20/2017 1851 12/23/2017 1110 Full Code [id number]  [name], MD Inpatient   09/08/2017 0644 09/10/2017 2127 Full Code [id number]  Harrie Foreman, MD Inpatient   07/29/2017 1044 07/31/2017 2152 Full Code [id number]  Henreitta Leber, MD Inpatient   06/19/2017 0027 06/20/2017 2114 Full Code [id number]  Lance Coon, MD ED   05/20/2017 1226 05/21/2017 1759 Full Code [id number]  [name], MD Inpatient   06/22/2016 0430 06/22/2016 2150 Full Code [id number]  Quintella Baton, MD Inpatient   06/04/2016 1843 06/06/2016 1815 Full Code [id number]  [name], MD Inpatient     Disposition Plan: Potential discharge tomorrow  Consultants: cardiology  Time spent: 28 minutes  [place]

## 2018-07-04 DIAGNOSIS — I428 Other cardiomyopathies: Secondary | ICD-10-CM | POA: Diagnosis not present

## 2018-07-04 DIAGNOSIS — I493 Ventricular premature depolarization: Secondary | ICD-10-CM | POA: Diagnosis not present

## 2018-07-04 DIAGNOSIS — I4901 Ventricular fibrillation: Secondary | ICD-10-CM | POA: Diagnosis not present

## 2018-07-04 DIAGNOSIS — R55 Syncope and collapse: Secondary | ICD-10-CM | POA: Diagnosis not present

## 2018-07-04 DIAGNOSIS — I5043 Acute on chronic combined systolic (congestive) and diastolic (congestive) heart failure: Secondary | ICD-10-CM

## 2018-07-04 LAB — BASIC METABOLIC PANEL
Anion gap: 13 (ref 5–15)
BUN: 49 mg/dL — AB (ref 6–20)
CALCIUM: 9.2 mg/dL (ref 8.9–10.3)
CHLORIDE: 95 mmol/L — AB (ref 98–111)
CO2: 28 mmol/L (ref 22–32)
CREATININE: 2.27 mg/dL — AB (ref 0.61–1.24)
GFR, EST AFRICAN AMERICAN: 36 mL/min — AB (ref >60)
GFR, EST NON AFRICAN AMERICAN: 31 mL/min — AB (ref >60)
Glucose, Bld: 117 mg/dL — ABNORMAL HIGH (ref 70–99)
Potassium: 4 mmol/L (ref 3.5–5.1)
Sodium: 136 mmol/L (ref 135–145)

## 2018-07-04 MED ORDER — LACTULOSE 10 GM/15ML PO SOLN
30.0000 g | Freq: Once | ORAL | Status: AC
  Administered 2018-07-04: 30 g via ORAL
  Filled 2018-07-04: qty 60

## 2018-07-04 MED ORDER — SODIUM CHLORIDE 0.9 % IV SOLN
Freq: Once | INTRAVENOUS | Status: AC
  Administered 2018-07-04: 12:00:00 via INTRAVENOUS

## 2018-07-04 NOTE — Progress Notes (Signed)
Talked to Dr. Manuella Ghazi about patient's BP of 107/89, BP has been in the softer side throughout the day.  Patient has scheduled entresto, order to hold. RN will continue to monitor.

## 2018-07-04 NOTE — Progress Notes (Addendum)
Renal function back now with worsening SCr. Will hols spironolactone, torsemide, and metolazone. Will need to monitor for at least another 24 hours. Cannot rukle out low output/cardiorenal syndrome. Patient re-examined, does not appear volume up. Cannot rule out some component of medications playing a role in his N/V. If symptoms persist, may need to transfer to [facility].

## 2018-07-04 NOTE — Progress Notes (Addendum)
Patient ambulated around nurses station 25 times, tolerated well. No complaints. Will continue to assess and monitor.

## 2018-07-04 NOTE — Progress Notes (Signed)
Progress Note  Patient Name: Stephen [name] Date of Encounter: 07/04/2018  Primary Cardiologist:Arida Primary Electrophysiologist:Klein Primary Advanced Heart Failure:Bensimhon   Subjective   No dizziness, presyncope, or syncope. No chest pain or SOB. No palpitations. Tolerating evidence-based heart failure medications without issues (BP soft, though stable and not missing any doses now). BMET pending this morning. Ambulating in the hallway without issues.   Inpatient Medications    Scheduled Meds: . amiodarone  400 mg Oral Daily  . aspirin EC  81 mg Oral Daily  . atorvastatin  80 mg Oral QHS  . carvedilol  3.125 mg Oral BID WC  . clopidogrel  75 mg Oral Daily  . enoxaparin (LOVENOX) injection  40 mg Subcutaneous Q24H  . febuxostat  40 mg Oral Daily  . metolazone  2.5 mg Oral BID  . mexiletine  300 mg Oral BID  . multivitamin with minerals  1 tablet Oral Daily  . potassium chloride SA  40 mEq Oral BID  . ranolazine  500 mg Oral BID  . sacubitril-valsartan  1 tablet Oral BID  . sodium chloride flush  3 mL Intravenous Q12H  . spironolactone  12.5 mg Oral Daily  . tiotropium  1 capsule Inhalation Daily  . torsemide  40 mg Oral BID   Continuous Infusions:  PRN Meds: acetaminophen, colchicine, ondansetron **OR** ondansetron (ZOFRAN) IV   Vital Signs    Vitals:   07/03/18 1949 07/03/18 2217 07/04/18 0327 07/04/18 0334  BP: (!) 115/96 (!) 81/67 (!) 75/64 (!) 89/71  Pulse: 72 76 91 82  Resp:   18   Temp:   98.4 F (36.9 C)   TempSrc:   Oral   SpO2:   93%   Weight:    235 lb 4.8 oz (106.7 kg)  Height:        Intake/Output Summary (Last 24 hours) at 07/04/2018 0741 Last data filed at 07/04/2018 0006 Gross per 24 hour  Intake 360 ml  Output 1675 ml  Net -1315 ml   Filed Weights   07/02/18 0448 07/03/18 0505 07/04/18 0334  Weight: 231 lb 9.6 oz (105.1 kg) 234 lb 3.2 oz (106.2 kg) 235 lb 4.8 oz (106.7 kg)    Telemetry    NSR with underlying BBB, frequent  PVCs occasionally in a pattern of ventricular bigeminy and trigeminy, rare ventricular couplet - Personally Reviewed  ECG    n/a - Personally Reviewed  Physical Exam   GEN: No acute distress.   Neck: No JVD. Cardiac: RRR, no murmurs, rubs, or gallops.  Respiratory: Clear to auscultation bilaterally.  GI: Soft, nontender, non-distended.   MS: No edema; No deformity. Neuro:  Alert and oriented x 3; Nonfocal.  Psych: Normal affect.  Labs    Chemistry Recent Labs  Lab 06/30/18 1503 07/02/18 0744  NA 139 138  K 3.6 3.7  CL 101 101  CO2 24 26  GLUCOSE 153* 118*  BUN 31* 43*  CREATININE 1.88* 1.79*  CALCIUM 9.3 9.4  GFRNONAA 39* 42*  GFRAA 45* 48*  ANIONGAP 14 11     Hematology Recent Labs  Lab 06/30/18 1503 07/01/18 0559  WBC 9.6 9.0  RBC 5.51 4.90  HGB 16.1 14.6  HCT 48.2 42.5  MCV 87.4 86.7  MCH 29.3 29.7  MCHC 33.5 34.3  RDW 14.2 14.1  PLT 249 213    Cardiac Enzymes Recent Labs  Lab 06/30/18 1505 06/30/18 1905 07/01/18 0013 07/01/18 0559  TROPONINI 0.08* 0.10* 0.10* 0.09*   No  results for input(s): TROPIPOC in the last 168 hours.   BNP Recent Labs  Lab 06/30/18 1503  BNP 549.0*     DDimer No results for input(s): DDIMER in the last 168 hours.   Radiology    No results found.  Cardiac Studies   EPS with ablation 06/10/2018: Conclusion   SURGEON: Thompson Grayer, MD.  PREPROCEDURE DIAGNOSIS: Ventricular tachycardia, frequent premature ventricular contractions  POSTPROCEDURE DIAGNOSIS:frequent premature ventricular contractions arising from the inferolateral basal left ventricle  PROCEDURE: 1. Comprehensive EP study. 2. Coronary sinus pacing and recording. 3. Left atrial and left ventricular pacing and recording. 4. A 3D mapping of ventricular tachycardia. 5. Radiofrequency ablation of ventricular tachycardia. 6. Arterial blood pressure monitoring. 7. single-chamber defibrillator, interrogation, and reprogramming.      Echo 04/2018: Study Conclusions  - Left ventricle: The cavity size was severely dilated. Wall thickness was normal. Systolic function was severely reduced. The estimated ejection fraction was in the range of 10% to 15%. Severe diffuse hypokinesis with regional variations. The study is not technically sufficient to allow evaluation of LV diastolic function. No evidence of thrombus. - Ventricular septum: Septal motion showed abnormal function, dyssynergy, and paradox. - Mitral valve: There was mild regurgitation. - Left atrium: The atrium was moderately dilated. - Right atrium: The atrium was mildly dilated.  Impressions:  - Doppler signs of severely reduced cardiac output are present. Persistent ventricular bigeminy with ineffective PVCs noted (&quot;effective heart rate&quot; 25 bpm).   Echo 06/30/2018: Study Conclusions  - Left ventricle: The cavity size was severely dilated. Wall thickness was increased in a pattern of mild LVH. The estimated ejection fraction was in the range of 10% to 15%. Diffuse hypokinesis. Regional wall motion abnormalities cannot be excluded. The study is not technically sufficient to allow evaluation of LV diastolic function. - Aortic valve: There was trivial regurgitation. - Mitral valve: There was mild regurgitation. - Left atrium: The atrium was moderately dilated. - Right ventricle: The cavity size was mildly dilated. Wall thickness was mildly increased. Systolic function was normal. - Right atrium: The atrium was mildly dilated.   Patient Profile     52 y.o. male with history of normal coronary arteries by LHC in 8315, chronic systolic CHF due to NICM, frequent PVCs, VT/NSVT/VF arrest s/p MDT ICD s/p EPS with ablation 06/10/2018, TIA in 2017 on Plavix, prior tobacco abuse, and HTNwho is being seen today for the evaluation of syncope/abnormal EKGat the request of Dr. Tressia Miners.  Assessment & Plan     1. Syncope/cardiac arrest: -Device interrogated in the ED on 7/1 showing VF with ATP, shock aborted -No further episodes -Has been seen by EP this admission with recommendation to increase mexiletine to 300 mg bid and add Ranexa 500 mg bid, tolerating these changes -Amiodarone has been increased to 400 mg daily  -Continue Coreg 3.125 mg bid -Magnesium at goal -Potassium pending this morning, recommend repletion to > 4.0 as indicated -Has been monitored on telemetry for 4 days without any further evidence of sustained ventricular runs -He has follow up with Dr. Rayann Heman on 7/10, with plans for possible repeat ablation (question of epicardial source) -Would look to treat underlying mechanism initially prior to moving forward with advanced heart failure techniques (+/- LVAD/transplant)   2. Abnormal EKG/frequent PVCs: -High PVC burden persists, though no sustained runs -Continue current medications as above per EP -Has follow up with Dr. Rayann Heman on 7/10  3. NICM: -He does not appear grossly volume up at this time -BP low, though  he has been able to take medications including Coreg, Entresto, torsemide, spironolactone, and metolazone without issues -Continue evidence-based heart failure therapy, BP precludes titration  4. Elevated troponin: -Recent LHC with normal coronary arteries -Likely supply demand ischemia in the setting of the above -No plans for inpatient ischemic evaluation  5. History of VT/NSVT: -Status post MDT ICD -Medications as above -Has follow up with Dr. Rayann Heman on 7/10  For questions or updates, please contact [facility] Please consult [email or web address] for contact info under Cardiology/STEMI.    Signed, Christell Faith, PA-C [place] Pager: [phone] 07/04/2018, 7:41 AM

## 2018-07-04 NOTE — Progress Notes (Signed)
Patient ID: Stephen [name], male   DOB: 01/02/1966, 52 y.o.   MRN: [id number]  [employer] [facility] PROGRESS NOTE  Stephen [name] WNU:[id number] DOB: Feb 01, 1966 DOA: 06/30/2018 PCP: Park Liter P, DO  HPI/Subjective: Patient had some nausea or vomiting 2 times this morning.  No abdominal gurgling today.  Had small bowel movement yesterday.  Spoke with sister on the phone and when they started the middle lobe zone and that is when he started having nausea.  Objective: Vitals:   07/04/18 0334 07/04/18 0821  BP: (!) 89/71 100/75  Pulse: 82 69  Resp:  18  Temp:  97.7 F (36.5 C)  SpO2:  98%    Filed Weights   07/02/18 0448 07/03/18 0505 07/04/18 0334  Weight: 105.1 kg (231 lb 9.6 oz) 106.2 kg (234 lb 3.2 oz) 106.7 kg (235 lb 4.8 oz)    ROS: Review of Systems  Constitutional: Negative for chills and fever.  Eyes: Negative for blurred vision.  Respiratory: Negative for cough and shortness of breath.   Cardiovascular: Negative for chest pain.  Gastrointestinal: Positive for nausea and vomiting. Negative for abdominal pain, constipation and diarrhea.  Genitourinary: Negative for dysuria.  Musculoskeletal: Negative for joint pain.  Neurological: Negative for dizziness and headaches.   Exam: Physical Exam  Constitutional: He is oriented to person, place, and time.  HENT:  Nose: No mucosal edema.  Mouth/Throat: No oropharyngeal exudate or posterior oropharyngeal edema.  Eyes: Pupils are equal, round, and reactive to light. Conjunctivae, EOM and lids are normal.  Neck: No JVD present. Carotid bruit is not present. No edema present. No thyroid mass and no thyromegaly present.  Cardiovascular: S1 normal and S2 normal. Exam reveals no gallop.  No murmur heard. Pulses:      Dorsalis pedis pulses are 2+ on the right side, and 2+ on the left side.  Respiratory: No respiratory distress. He has no wheezes. He has no rhonchi. He has no rales.  GI: Soft. Bowel sounds are normal. There is  no tenderness.  Musculoskeletal:       Right ankle: He exhibits no swelling.       Left ankle: He exhibits no swelling.  Lymphadenopathy:    He has no cervical adenopathy.  Neurological: He is alert and oriented to person, place, and time. No cranial nerve deficit.  Skin: Skin is warm. No rash noted. Nails show no clubbing.  Psychiatric: He has a normal mood and affect.      Data Reviewed: Basic Metabolic Panel: Recent Labs  Lab 06/30/18 1503 06/30/18 1515 07/02/18 0744 07/04/18 0725  NA 139  --  138 136  K 3.6  --  3.7 4.0  CL 101  --  101 95*  CO2 24  --  26 28  GLUCOSE 153*  --  118* 117*  BUN 31*  --  43* 49*  CREATININE 1.88*  --  1.79* 2.27*  CALCIUM 9.3  --  9.4 9.2  MG  --  1.9 2.2  --    CBC: Recent Labs  Lab 06/30/18 1503 07/01/18 0559  WBC 9.6 9.0  HGB 16.1 14.6  HCT 48.2 42.5  MCV 87.4 86.7  PLT 249 213   Cardiac Enzymes: Recent Labs  Lab 06/30/18 1505 06/30/18 1905 07/01/18 0013 07/01/18 0559  TROPONINI 0.08* 0.10* 0.10* 0.09*   BNP (last 3 results) Recent Labs    12/24/17 0303 03/19/18 0931 06/30/18 1503  BNP 1,335.4* 1,184.0* 549.0*    CBG: Recent Labs  Lab 06/30/18  [place]    Scheduled Meds: . amiodarone  400 mg Oral Daily  . aspirin EC  81 mg Oral Daily  . atorvastatin  80 mg Oral QHS  . carvedilol  3.125 mg Oral BID WC  . clopidogrel  75 mg Oral Daily  . enoxaparin (LOVENOX) injection  40 mg Subcutaneous Q24H  . febuxostat  40 mg Oral Daily  . mexiletine  300 mg Oral BID  . multivitamin with minerals  1 tablet Oral Daily  . potassium chloride SA  40 mEq Oral BID  . ranolazine  500 mg Oral BID  . sacubitril-valsartan  1 tablet Oral BID  . sodium chloride flush  3 mL Intravenous Q12H  . tiotropium  1 capsule Inhalation Daily   Continuous Infusions: . sodium chloride 100 mL/hr at 07/04/18 1150    Assessment/Plan:  1. Acute kidney injury on chronic kidney disease stage III.  Hold torsemide, Spironolactone  and metolazone.  Likely will end up discontinuing the low zone.   2. Hypotension.  Patient not orthostatic at this point.  We are holding diuretics today.  Continue to watch closely on Entresto. 3. Syncope with ventricular fibrillation.  Mexiletine increased to 300 mg twice daily.  Patient also on amiodarone, Coreg. 4. Nonischemic cardiomyopathy with low ejection fraction.  Continue Coreg, Entresto. 5. History of CVA on aspirin statin Plavix 6. History of gout on colchicine and Uloric 7. History of COPD on Spiriva   Code Status:     Code Status Orders  (From admission, onward)        Start     Ordered   06/30/18 1758  Full code  Continuous     06/30/18 1757    Code Status History    Date Active Date Inactive Code Status Order ID Comments User Context   06/10/2018 1606 06/11/2018 1537 Full Code [id number]  Thompson Grayer, MD Inpatient   12/23/2017 1153 12/31/2017 1729 Full Code [id number]  Ilean China Inpatient   12/20/2017 1851 12/23/2017 1110 Full Code [id number]  [name], MD Inpatient   09/08/2017 0644 09/10/2017 2127 Full Code [id number]  Harrie Foreman, MD Inpatient   07/29/2017 1044 07/31/2017 2152 Full Code [id number]  Henreitta Leber, MD Inpatient   06/19/2017 0027 06/20/2017 2114 Full Code [id number]  Lance Coon, MD ED   05/20/2017 1226 05/21/2017 1759 Full Code [id number]  [name], MD Inpatient   06/22/2016 0430 06/22/2016 2150 Full Code [id number]  Quintella Baton, MD Inpatient   06/04/2016 1843 06/06/2016 1815 Full Code [id number]  [name], MD Inpatient     Disposition Plan: Creatinine needs to stabilize and improve prior to discharge.  Consultants: cardiology  Time spent: 28 minutes, case discussed with cardiology and patient's sister on the phone  Susan Moore

## 2018-07-05 DIAGNOSIS — I4901 Ventricular fibrillation: Secondary | ICD-10-CM | POA: Diagnosis not present

## 2018-07-05 DIAGNOSIS — I428 Other cardiomyopathies: Secondary | ICD-10-CM | POA: Diagnosis not present

## 2018-07-05 LAB — BASIC METABOLIC PANEL
Anion gap: 9 (ref 5–15)
BUN: 41 mg/dL — AB (ref 6–20)
CO2: 28 mmol/L (ref 22–32)
Calcium: 9.3 mg/dL (ref 8.9–10.3)
Chloride: 100 mmol/L (ref 98–111)
Creatinine, Ser: 1.96 mg/dL — ABNORMAL HIGH (ref 0.61–1.24)
GFR calc Af Amer: 43 mL/min — ABNORMAL LOW (ref >60)
GFR, EST NON AFRICAN AMERICAN: 37 mL/min — AB (ref >60)
GLUCOSE: 110 mg/dL — AB (ref 70–99)
Potassium: 4.6 mmol/L (ref 3.5–5.1)
Sodium: 137 mmol/L (ref 135–145)

## 2018-07-05 MED ORDER — TORSEMIDE 20 MG PO TABS
40.0000 mg | ORAL_TABLET | Freq: Every day | ORAL | Status: DC
Start: 2018-07-05 — End: 2018-07-06
  Administered 2018-07-05 – 2018-07-06 (×2): 40 mg via ORAL
  Filled 2018-07-05 (×2): qty 2

## 2018-07-05 MED ORDER — FLEET ENEMA 7-19 GM/118ML RE ENEM: 1.0000 | ENEMA | Freq: Every day | RECTAL | Status: DC | PRN

## 2018-07-05 MED ORDER — POLYETHYLENE GLYCOL 3350 17 G PO PACK
17.0000 g | PACK | Freq: Every day | ORAL | Status: DC
  Administered 2018-07-05 – 2018-07-06 (×2): 17 g via ORAL
  Filled 2018-07-05: qty 1

## 2018-07-05 MED ORDER — BISACODYL 10 MG RE SUPP
10.0000 mg | Freq: Every day | RECTAL | Status: DC | PRN
  Administered 2018-07-05: 10 mg via RECTAL

## 2018-07-05 NOTE — Progress Notes (Signed)
Progress Note  Patient Name: Stephen [name] Date of Encounter: 07/05/2018  Primary Cardiologist:Dilara Navarrete Primary Electrophysiologist:Klein Primary Advanced Heart Failure:Bensimhon   Subjective   The patient vomited yesterday but no further vomiting since then.  He continues to be nauseous.  Renal function worsened yesterday but seems to be better today.  Inpatient Medications    Scheduled Meds: . amiodarone  400 mg Oral Daily  . aspirin EC  81 mg Oral Daily  . atorvastatin  80 mg Oral QHS  . carvedilol  3.125 mg Oral BID WC  . clopidogrel  75 mg Oral Daily  . enoxaparin (LOVENOX) injection  40 mg Subcutaneous Q24H  . febuxostat  40 mg Oral Daily  . mexiletine  300 mg Oral BID  . multivitamin with minerals  1 tablet Oral Daily  . polyethylene glycol  17 g Oral Daily  . potassium chloride SA  40 mEq Oral BID  . sacubitril-valsartan  1 tablet Oral BID  . sodium chloride flush  3 mL Intravenous Q12H  . tiotropium  1 capsule Inhalation Daily  . torsemide  40 mg Oral Daily   Continuous Infusions:  PRN Meds: acetaminophen, bisacodyl, colchicine, ondansetron **OR** ondansetron (ZOFRAN) IV, sodium phosphate   Vital Signs    Vitals:   07/04/18 1514 07/04/18 1916 07/05/18 0336 07/05/18 0800  BP: 98/84 107/89 95/78 117/90  Pulse: 78 61 74 74  Resp: 18 19 18 16   Temp: 98 F (36.7 C) 98.7 F (37.1 C) 98.3 F (36.8 C) 98.5 F (36.9 C)  TempSrc:  Oral Oral Oral  SpO2: 100% 98% 97% 98%  Weight:   234 lb 11.2 oz (106.5 kg)   Height:        Intake/Output Summary (Last 24 hours) at 07/05/2018 1026 Last data filed at 07/05/2018 1008 Gross per 24 hour  Intake 960 ml  Output 550 ml  Net 410 ml   Filed Weights   07/03/18 0505 07/04/18 0334 07/05/18 0336  Weight: 234 lb 3.2 oz (106.2 kg) 235 lb 4.8 oz (106.7 kg) 234 lb 11.2 oz (106.5 kg)    Telemetry    NSR with underlying BBB, frequent PVCs occasionally in a pattern of ventricular bigeminy and trigeminy, rare  ventricular couplet - Personally Reviewed  ECG    n/a - Personally Reviewed  Physical Exam   GEN: No acute distress.   Neck: No JVD. Cardiac: RRR with frequent premature beats, no murmurs, rubs, or gallops.  Respiratory: Clear to auscultation bilaterally.  GI: Soft, nontender, non-distended.   MS: No edema; No deformity. Neuro:  Alert and oriented x 3; Nonfocal.  Psych: Normal affect.  Labs    Chemistry Recent Labs  Lab 07/02/18 0744 07/04/18 0725 07/05/18 0330  NA 138 136 137  K 3.7 4.0 4.6  CL 101 95* 100  CO2 26 28 28   GLUCOSE 118* 117* 110*  BUN 43* 49* 41*  CREATININE 1.79* 2.27* 1.96*  CALCIUM 9.4 9.2 9.3  GFRNONAA 42* 31* 37*  GFRAA 48* 36* 43*  ANIONGAP 11 13 9      Hematology Recent Labs  Lab 06/30/18 1503 07/01/18 0559  WBC 9.6 9.0  RBC 5.51 4.90  HGB 16.1 14.6  HCT 48.2 42.5  MCV 87.4 86.7  MCH 29.3 29.7  MCHC 33.5 34.3  RDW 14.2 14.1  PLT 249 213    Cardiac Enzymes Recent Labs  Lab 06/30/18 1505 06/30/18 1905 07/01/18 0013 07/01/18 0559  TROPONINI 0.08* 0.10* 0.10* 0.09*   No results for input(s):  TROPIPOC in the last 168 hours.   BNP Recent Labs  Lab 06/30/18 1503  BNP 549.0*     DDimer No results for input(s): DDIMER in the last 168 hours.   Radiology    No results found.  Cardiac Studies   EPS with ablation 06/10/2018: Conclusion   SURGEON: Thompson Grayer, MD.  PREPROCEDURE DIAGNOSIS: Ventricular tachycardia, frequent premature ventricular contractions  POSTPROCEDURE DIAGNOSIS:frequent premature ventricular contractions arising from the inferolateral basal left ventricle  PROCEDURE: 1. Comprehensive EP study. 2. Coronary sinus pacing and recording. 3. Left atrial and left ventricular pacing and recording. 4. A 3D mapping of ventricular tachycardia. 5. Radiofrequency ablation of ventricular tachycardia. 6. Arterial blood pressure monitoring. 7. single-chamber defibrillator, interrogation, and  reprogramming.     Echo 04/2018: Study Conclusions  - Left ventricle: The cavity size was severely dilated. Wall thickness was normal. Systolic function was severely reduced. The estimated ejection fraction was in the range of 10% to 15%. Severe diffuse hypokinesis with regional variations. The study is not technically sufficient to allow evaluation of LV diastolic function. No evidence of thrombus. - Ventricular septum: Septal motion showed abnormal function, dyssynergy, and paradox. - Mitral valve: There was mild regurgitation. - Left atrium: The atrium was moderately dilated. - Right atrium: The atrium was mildly dilated.  Impressions:  - Doppler signs of severely reduced cardiac output are present. Persistent ventricular bigeminy with ineffective PVCs noted (&quot;effective heart rate&quot; 25 bpm).   Echo 06/30/2018: Study Conclusions  - Left ventricle: The cavity size was severely dilated. Wall thickness was increased in a pattern of mild LVH. The estimated ejection fraction was in the range of 10% to 15%. Diffuse hypokinesis. Regional wall motion abnormalities cannot be excluded. The study is not technically sufficient to allow evaluation of LV diastolic function. - Aortic valve: There was trivial regurgitation. - Mitral valve: There was mild regurgitation. - Left atrium: The atrium was moderately dilated. - Right ventricle: The cavity size was mildly dilated. Wall thickness was mildly increased. Systolic function was normal. - Right atrium: The atrium was mildly dilated.   Patient Profile     52 y.o. male with history of normal coronary arteries by LHC in 9169, chronic systolic CHF due to NICM, frequent PVCs, VT/NSVT/VF arrest s/p MDT ICD s/p EPS with ablation 06/10/2018, TIA in 2017 on Plavix, prior tobacco abuse, and HTNwho presented with syncope due to ventricular fibrillation treated successfully with ATP without  shock  Assessment & Plan    1. Syncope/cardiac arrest: -Device interrogated in the ED on 7/1 showing VF with ATP, shock aborted -No further episodes -Has been seen by EP this admission with recommendation to increase mexiletine to 300 mg bid and add Ranexa 500 mg bid, tolerating these changes -Amiodarone has been increased to 400 mg daily  -Continue Coreg 3.125 mg bid --He has follow up with Dr. Rayann Heman on 7/10, with plans for possible repeat ablation (question of epicardial source) -Unfortunately, the patient had GI symptoms including vomiting and continues to be nauseous.  Ranexa is the only new medication that was added during this admission which most likely is the culprit.  Thus, I discontinued this medication today.  Monitor for another day. -Would look to treat underlying mechanism initially prior to moving forward with advanced heart failure techniques (+/- LVAD/transplant)   2. Abnormal EKG/frequent PVCs: -High PVC burden persists, though no sustained runs -Continue current medications as above per EP -Has follow up with Dr. Rayann Heman on 7/10  3. NICM: -His diuretics were held  yesterday due to hypotension and acute on chronic renal failure. Renal function has improved since then.  He is highly diuretic dependent. I resume torsemide today at a lower dose of 40 mg once daily. Avoid metolazone for now. Continue to hold spironolactone given relatively low blood pressure.  4. Elevated troponin: -Recent LHC with normal coronary arteries -Likely supply demand ischemia in the setting of the above  5. History of VT/NSVT: -Status post MDT ICD -Medications as above  Possible discharge home tomorrow if he continues to be stable and renal function stabilizes.  For questions or updates, please contact [place] Please consult [email or web address] for contact info under Cardiology/STEMI.    Signed, Kathlyn Sacramento, MD [facility] 07/05/2018, 10:26 AM

## 2018-07-05 NOTE — Progress Notes (Signed)
Patient ID: Stephen [name], male   DOB: 25-Aug-1966, 52 y.o.   MRN: [id number]  [place] PROGRESS NOTE  Stephen [name]:[id number] DOB: [birth year] DOA: 06/30/2018 PCP: Park Liter P, DO  HPI/Subjective: Patient feeling okay.  Offers no complaints.  Still with some nausea but no vomiting.  Tolerating diet.  Still wants to have a better bowel movement.  Objective: Vitals:   07/05/18 0336 07/05/18 0800  BP: 95/78 117/90  Pulse: 74 74  Resp: 18 16  Temp: 98.3 F (36.8 C) 98.5 F (36.9 C)  SpO2: 97% 98%    Filed Weights   07/03/18 0505 07/04/18 0334 07/05/18 0336  Weight: 106.2 kg (234 lb 3.2 oz) 106.7 kg (235 lb 4.8 oz) 106.5 kg (234 lb 11.2 oz)    ROS: Review of Systems  Constitutional: Negative for chills and fever.  Eyes: Negative for blurred vision.  Respiratory: Negative for cough and shortness of breath.   Cardiovascular: Negative for chest pain.  Gastrointestinal: Positive for constipation and nausea. Negative for abdominal pain, diarrhea and vomiting.  Genitourinary: Negative for dysuria.  Musculoskeletal: Negative for joint pain.  Neurological: Negative for dizziness and headaches.   Exam: Physical Exam  Constitutional: He is oriented to person, place, and time.  HENT:  Nose: No mucosal edema.  Mouth/Throat: No oropharyngeal exudate or posterior oropharyngeal edema.  Eyes: Pupils are equal, round, and reactive to light. Conjunctivae, EOM and lids are normal.  Neck: No JVD present. Carotid bruit is not present. No edema present. No thyroid mass and no thyromegaly present.  Cardiovascular: S1 normal and S2 normal. Exam reveals no gallop.  No murmur heard. Pulses:      Dorsalis pedis pulses are 2+ on the right side, and 2+ on the left side.  Respiratory: No respiratory distress. He has no wheezes. He has no rhonchi. He has no rales.  GI: Soft. Bowel sounds are normal. There is no tenderness.  Musculoskeletal:       Right ankle: He exhibits no  swelling.       Left ankle: He exhibits no swelling.  Lymphadenopathy:    He has no cervical adenopathy.  Neurological: He is alert and oriented to person, place, and time. No cranial nerve deficit.  Skin: Skin is warm. No rash noted. Nails show no clubbing.  Psychiatric: He has a normal mood and affect.      Data Reviewed: Basic Metabolic Panel: Recent Labs  Lab 06/30/18 1503 06/30/18 1515 07/02/18 0744 07/04/18 0725 07/05/18 0330  NA 139  --  138 136 137  K 3.6  --  3.7 4.0 4.6  CL 101  --  101 95* 100  CO2 24  --  26 28 28   GLUCOSE 153*  --  118* 117* 110*  BUN 31*  --  43* 49* 41*  CREATININE 1.88*  --  1.79* 2.27* 1.96*  CALCIUM 9.3  --  9.4 9.2 9.3  MG  --  1.9 2.2  --   --    CBC: Recent Labs  Lab 06/30/18 1503 07/01/18 0559  WBC 9.6 9.0  HGB 16.1 14.6  HCT 48.2 42.5  MCV 87.4 86.7  PLT 249 213   Cardiac Enzymes: Recent Labs  Lab 06/30/18 1505 06/30/18 1905 07/01/18 0013 07/01/18 0559  TROPONINI 0.08* 0.10* 0.10* 0.09*   BNP (last 3 results) Recent Labs    12/24/17 0303 03/19/18 0931 06/30/18 1503  BNP 1,335.4* 1,184.0* 549.0*    CBG: Recent Labs  Lab 06/30/18 1459  GLUCAP 154*    Scheduled Meds: . amiodarone  400 mg Oral Daily  . aspirin EC  81 mg Oral Daily  . atorvastatin  80 mg Oral QHS  . carvedilol  3.125 mg Oral BID WC  . clopidogrel  75 mg Oral Daily  . enoxaparin (LOVENOX) injection  40 mg Subcutaneous Q24H  . febuxostat  40 mg Oral Daily  . mexiletine  300 mg Oral BID  . multivitamin with minerals  1 tablet Oral Daily  . polyethylene glycol  17 g Oral Daily  . potassium chloride SA  40 mEq Oral BID  . sacubitril-valsartan  1 tablet Oral BID  . sodium chloride flush  3 mL Intravenous Q12H  . tiotropium  1 capsule Inhalation Daily  . torsemide  40 mg Oral Daily   Continuous Infusions:   Assessment/Plan:  1. Acute kidney injury on chronic kidney disease stage III.  Continue to hold Spironolactone and metolazone.   Cardiology wanted to watch creatinine 1 more day.  They restarted once a day torsemide. 2. Relative hypotension.  Patient not orthostatic at this point.  Continue to give Entresto. 3. Syncope with ventricular fibrillation.  Mexiletine increased to 300 mg twice daily.  Patient also on amiodarone, Coreg. 4. Nonischemic cardiomyopathy with low ejection fraction.  Continue Coreg, Entresto. 5. History of CVA on aspirin statin Plavix 6. History of gout on colchicine and Uloric 7. History of COPD on Spiriva   Code Status:     Code Status Orders  (From admission, onward)        Start     Ordered   06/30/18 1758  Full code  Continuous     06/30/18 1757    Code Status History    Date Active Date Inactive Code Status Order ID Comments User Context   06/10/2018 1606 06/11/2018 1537 Full Code [id number]  Thompson Grayer, MD Inpatient   12/23/2017 1153 12/31/2017 1729 Full Code [id number]  Ilean China Inpatient   12/20/2017 1851 12/23/2017 1110 Full Code [id number]  [name], MD Inpatient   09/08/2017 0644 09/10/2017 2127 Full Code [id number]  Harrie Foreman, MD Inpatient   07/29/2017 1044 07/31/2017 2152 Full Code [id number]  Henreitta Leber, MD Inpatient   06/19/2017 0027 06/20/2017 2114 Full Code [id number]  Lance Coon, MD ED   05/20/2017 1226 05/21/2017 1759 Full Code [id number]  [name], MD Inpatient   06/22/2016 0430 06/22/2016 2150 Full Code [id number]  Quintella Baton, MD Inpatient   06/04/2016 1843 06/06/2016 1815 Full Code [id number]  [name], MD Inpatient     Disposition Plan:  cardiology wants to watch creatinine another day.  Consultants: cardiology  Time spent: 26 minutes, case discussed with cardiology  [place]

## 2018-07-06 LAB — BASIC METABOLIC PANEL
Anion gap: 9 (ref 5–15)
BUN: 37 mg/dL — AB (ref 6–20)
CO2: 29 mmol/L (ref 22–32)
Calcium: 9.4 mg/dL (ref 8.9–10.3)
Chloride: 100 mmol/L (ref 98–111)
Creatinine, Ser: 1.97 mg/dL — ABNORMAL HIGH (ref 0.61–1.24)
GFR calc Af Amer: 43 mL/min — ABNORMAL LOW (ref >60)
GFR, EST NON AFRICAN AMERICAN: 37 mL/min — AB (ref >60)
GLUCOSE: 108 mg/dL — AB (ref 70–99)
Potassium: 4.4 mmol/L (ref 3.5–5.1)
Sodium: 138 mmol/L (ref 135–145)

## 2018-07-06 MED ORDER — CARVEDILOL 3.125 MG PO TABS: 3.1250 mg | ORAL_TABLET | Freq: Two times a day (BID) | ORAL | 0 refills | Status: DC

## 2018-07-06 MED ORDER — TORSEMIDE 20 MG PO TABS: 40.0000 mg | ORAL_TABLET | Freq: Every day | ORAL | 0 refills | Status: DC

## 2018-07-06 MED ORDER — MEXILETINE HCL 150 MG PO CAPS: 300.0000 mg | ORAL_CAPSULE | Freq: Two times a day (BID) | ORAL | 0 refills | Status: DC

## 2018-07-06 MED ORDER — ATORVASTATIN CALCIUM 80 MG PO TABS: 80.0000 mg | ORAL_TABLET | Freq: Every day | ORAL | Status: DC

## 2018-07-06 MED ORDER — POTASSIUM CHLORIDE CRYS ER 20 MEQ PO TBCR: 20.0000 meq | EXTENDED_RELEASE_TABLET | Freq: Two times a day (BID) | ORAL | 0 refills | Status: DC

## 2018-07-06 MED ORDER — SACUBITRIL-VALSARTAN 24-26 MG PO TABS: 1.0000 | ORAL_TABLET | Freq: Two times a day (BID) | ORAL | 0 refills | Status: DC

## 2018-07-06 NOTE — Progress Notes (Signed)
Pt to be discharged to home today. Iv and tele removed. disch instructions and prescrips given to pt and sister to their understanding. disch via w.c. Accompanied by family

## 2018-07-06 NOTE — Discharge Summary (Signed)
[facility] [place] NAME: Stephen [name]    MR#:  [id number]  DATE OF BIRTH:  December 02, 1966  DATE OF ADMISSION:  06/30/2018 ADMITTING PHYSICIAN: [name], MD  DATE OF DISCHARGE: 07/06/2018  1:30 PM  PRIMARY CARE PHYSICIAN: Park Liter P, DO    ADMISSION DIAGNOSIS:  Multifocal PVCs [I49.3] Syncope, cardiogenic [R55]  DISCHARGE DIAGNOSIS:  Principal Problem:   VF (ventricular fibrillation) (HCC) Active Problems:   Nonischemic cardiomyopathy ([place])   Syncope   SECONDARY DIAGNOSIS:   Past Medical History:  Diagnosis Date  . AICD (automatic cardioverter/defibrillator) present   . AKI (acute kidney injury) ([place]) 12/24/2017  . Arrhythmia   . Arthritis    "lower back, knees" (06/10/2018)  . Cardiac arrest ([place]) 12/23/2017   Brief V-fib arrest  . Chest pain 09/08/2017  . CHF (congestive heart failure) (HCC)    nonischemic cardiomyopathy, EF 25%  . Gout    "on daily RX" (06/10/2018)  . Headache    "q couple months" (06/10/2018)  . High cholesterol   . History of kidney stones   . Hypertension   . Influenza A 12/24/2017  . NICM (nonischemic cardiomyopathy) ([place])   . Pneumonia 12/20/2017  . TIA (transient ischemic attack) 06/21/2016   "still affects my memory a little bit" (06/10/2018)    HOSPITAL COURSE:   1.  Acute kidney injury on chronic kidney disease stage III.  Continue to hold Spironolactone and metolazone.  Creatinine today the same as yesterday.  Creatinine 1.97 upon discharge.  Creatinine in April and May range between 1.38 and 1.61.  Creatinine when he came in this time was 1.88.  Creatinine peaked at 2.27.  Upon discharge 1.97. 2.  Relative hypotension.  We do like blood pressure on the lower side with people with heart failure.  Patient asymptomatic with the low blood pressure. 3.  Syncope with ventricular fibrillation.  Mexiletine increased to 300 mg twice a day.  Coreg decreased to 3.125 mg twice a day.  Patient on  amiodarone 400 mg daily. 4.  Nonischemic cardiomyopathy with low ejection fraction.  EF 10 to 15% on last echocardiogram. 5.  Chronic systolic congestive heart failure.  Cardiology decreased the dose of torsemide to once a day dosing.  Patient on low-dose Coreg and Entresto.  Aldactone was held due to acute kidney injury. 6.  Nausea.  Ranexa was discontinued and so was metolazone.  Sister stated that his nausea started with the starting of this medication metolazone. 7.  History of CVA on aspirin and Plavix and statin 8.  History of gout on colchicine and Uloric 9.  History of COPD on Spiriva   DISCHARGE CONDITIONS:   Satisfactory  CONSULTS OBTAINED:  Treatment Team:  Wellington Hampshire, MD  DRUG ALLERGIES:   Allergies  Allergen Reactions  . Lisinopril Cough    DISCHARGE MEDICATIONS:   Allergies as of 07/06/2018      Reactions   Lisinopril Cough      Medication List    STOP taking these medications   hydrocortisone 25 MG suppository Commonly known as:  ANUSOL-HC   metolazone 2.5 MG tablet Commonly known as:  ZAROXOLYN   sacubitril-valsartan 49-51 MG Commonly known as:  ENTRESTO Replaced by:  sacubitril-valsartan 24-26 MG   spironolactone 25 MG tablet Commonly known as:  ALDACTONE     TAKE these medications   acetaminophen 325 MG tablet Commonly known as:  TYLENOL Take 2 tablets (650 mg total) by mouth every 6 (six)  hours as needed. What changed:    how much to take  reasons to take this   albuterol 108 (90 Base) MCG/ACT inhaler Commonly known as:  PROVENTIL HFA;VENTOLIN HFA Inhale 2 puffs into the lungs every 4 (four) hours as needed for wheezing or shortness of breath.   amiodarone 200 MG tablet Commonly known as:  PACERONE Take 400 mg by mouth daily.   aspirin 81 MG EC tablet Take 1 tablet (81 mg total) by mouth daily.   atorvastatin 80 MG tablet Commonly known as:  LIPITOR Take 1 tablet (80 mg total) by mouth at bedtime.   carvedilol 3.125 MG  tablet Commonly known as:  COREG Take 1 tablet (3.125 mg total) by mouth 2 (two) times daily with a meal. What changed:    medication strength  how much to take   Chest Rub 4.8-1.2-2.6 % Oint Apply 1 application topically daily as needed (congestion).   clopidogrel 75 MG tablet Commonly known as:  PLAVIX Take 1 tablet (75 mg total) by mouth daily.   colchicine 0.6 MG tablet Take 0.6 mg by mouth daily as needed (gout flare).   diclofenac sodium 1 % Gel Commonly known as:  VOLTAREN Apply 2 g topically 4 (four) times daily as needed (pain).   febuxostat 40 MG tablet Commonly known as:  ULORIC Take 1 tablet (40 mg total) by mouth daily.   fluticasone 50 MCG/ACT nasal spray Commonly known as:  FLONASE Place 2 sprays into both nostrils daily. What changed:    when to take this  reasons to take this   ICY HOT EX Apply 1 application topically daily as needed (pain).   mexiletine 150 MG capsule Commonly known as:  MEXITIL Take 2 capsules (300 mg total) by mouth 2 (two) times daily. What changed:  how much to take   multivitamin with minerals Tabs tablet Take 1 tablet by mouth daily.   nitroGLYCERIN 0.4 MG SL tablet Commonly known as:  NITROSTAT Place 1 tablet (0.4 mg total) under the tongue every 5 (five) minutes as needed for chest pain.   potassium chloride SA 20 MEQ tablet Commonly known as:  K-DUR,KLOR-CON Take 1 tablet (20 mEq total) by mouth 2 (two) times daily. What changed:  how much to take   sacubitril-valsartan 24-26 MG Commonly known as:  ENTRESTO Take 1 tablet by mouth 2 (two) times daily. Replaces:  sacubitril-valsartan 49-51 MG   SPIRIVA RESPIMAT 2.5 MCG/ACT Aers Generic drug:  Tiotropium Bromide Monohydrate Inhale 1 puff into the lungs daily.   torsemide 20 MG tablet Commonly known as:  DEMADEX Take 2 tablets (40 mg total) by mouth daily. What changed:  when to take this        DISCHARGE INSTRUCTIONS:   Follow-up PMD 6 days Follow-up  Dr. Rayann Heman 2 weeks Follow-up Dr. Haroldine Laws 1 to 2 weeks  If you experience worsening of your admission symptoms, develop shortness of breath, life threatening emergency, suicidal or homicidal thoughts you must seek medical attention immediately by calling 911 or calling your MD immediately  if symptoms less severe.  You Must read complete instructions/literature along with all the possible adverse reactions/side effects for all the Medicines you take and that have been prescribed to you. Take any new Medicines after you have completely understood and accept all the possible adverse reactions/side effects.   Please note  You were cared for by a hospitalist during your hospital stay. If you have any questions about your discharge medications or the care you received  while you were in the hospital after you are discharged, you can call the unit and asked to speak with the hospitalist on call if the hospitalist that took care of you is not available. Once you are discharged, your primary care physician will handle any further medical issues. Please note that NO REFILLS for any discharge medications will be authorized once you are discharged, as it is imperative that you return to your primary care physician (or establish a relationship with a primary care physician if you do not have one) for your aftercare needs so that they can reassess your need for medications and monitor your lab values.    Today   CHIEF COMPLAINT:   Chief Complaint  Patient presents with  . Loss of Consciousness    HISTORY OF PRESENT ILLNESS:  Catarino Vold  is a 52 y.o. male with a known history of    VITAL SIGNS:  Blood pressure 109/88, pulse (!) 32, temperature 98.5 F (36.9 C), resp. rate 14, height 6\' 1"  (1.854 m), weight 105.6 kg (232 lb 11.2 oz), SpO2 100 %.  Pulse was not 32 (this was taken off the blood pressure cuff) The patient has a pacemaker and paced at 65.  When people are on telemetry monitoring the  heart rate should always be marked from the telemetry monitoring and not from the blood pressure cuff.  PHYSICAL EXAMINATION:  GENERAL:  52 y.o.-year-old patient lying in the bed with no acute distress.  EYES: Pupils equal, round, reactive to light and accommodation. No scleral icterus. Extraocular muscles intact.  HEENT: Head atraumatic, normocephalic. Oropharynx and nasopharynx clear.  NECK:  Supple, no jugular venous distention. No thyroid enlargement, no tenderness.  LUNGS: Normal breath sounds bilaterally, no wheezing, rales,rhonchi or crepitation. No use of accessory muscles of respiration.  CARDIOVASCULAR: S1, S2 normal. No murmurs, rubs, or gallops.  ABDOMEN: Soft, non-tender, non-distended. Bowel sounds present. No organomegaly or mass.  EXTREMITIES: trace pedal edema, No cyanosis, or clubbing.  NEUROLOGIC: Cranial nerves II through XII are intact. Muscle strength 5/5 in all extremities. Sensation intact. Gait not checked.  PSYCHIATRIC: The patient is alert and oriented x 3.  SKIN: No obvious rash, lesion, or ulcer.   DATA REVIEW:   CBC Recent Labs  Lab 07/01/18 0559  WBC 9.0  HGB 14.6  HCT 42.5  PLT 213    Chemistries  Recent Labs  Lab 07/02/18 0744  07/06/18 0444  NA 138   < > 138  K 3.7   < > 4.4  CL 101   < > 100  CO2 26   < > 29  GLUCOSE 118*   < > 108*  BUN 43*   < > 37*  CREATININE 1.79*   < > 1.97*  CALCIUM 9.4   < > 9.4  MG 2.2  --   --    < > = values in this interval not displayed.    Cardiac Enzymes Recent Labs  Lab 07/01/18 0559  TROPONINI 0.09*    Microbiology Results  Results for orders placed or performed in visit on 06/16/18  GC/Chlamydia Probe Amp     Status: None   Collection Time: 06/16/18 11:36 AM  Result Value Ref Range Status   Chlamydia trachomatis, NAA Negative Negative Final   Neisseria gonorrhoeae by PCR Negative Negative Final     Management plans discussed with the patient, and Dr. Fletcher Anon and they are in  agreement.  CODE STATUS:     Code Status Orders  (  From admission, onward)        Start     Ordered   06/30/18 1758  Full code  Continuous     06/30/18 1757    Code Status History    Date Active Date Inactive Code Status Order ID Comments User Context   06/10/2018 1606 06/11/2018 1537 Full Code [id number]  Thompson Grayer, MD Inpatient   12/23/2017 1153 12/31/2017 1729 Full Code [id number]  Ilean China Inpatient   12/20/2017 1851 12/23/2017 1110 Full Code [id number]  [name], MD Inpatient   09/08/2017 0644 09/10/2017 2127 Full Code [id number]  Harrie Foreman, MD Inpatient   07/29/2017 1044 07/31/2017 2152 Full Code [id number]  Henreitta Leber, MD Inpatient   06/19/2017 0027 06/20/2017 2114 Full Code [id number]  Lance Coon, MD ED   05/20/2017 1226 05/21/2017 1759 Full Code [id number]  [name], MD Inpatient   06/22/2016 0430 06/22/2016 2150 Full Code [id number]  Quintella Baton, MD Inpatient   06/04/2016 1843 06/06/2016 1815 Full Code [id number]  [name], MD Inpatient      TOTAL TIME TAKING CARE OF THIS PATIENT: 35 minutes.    Loletha Grayer M.D on 07/06/2018 at 2:04 PM  Between 7am to 6pm - Pager - [phone]  After 6pm go to [email or web address] - Proofreader  [facility]  [phone]  CC: Primary care physician; Valerie Roys, DO

## 2018-07-07 ENCOUNTER — Encounter: Payer: Self-pay | Admitting: Internal Medicine

## 2018-07-07 ENCOUNTER — Telehealth: Payer: Self-pay

## 2018-07-07 NOTE — Telephone Encounter (Signed)
Transition Care Management Follow-up Telephone Call  How have you been since you were released from the hospital? "feeling a little better, just nauseated and vomiting occasionally  "  Do you understand why you were in the hospital? yes  Do you have a copy of your discharge instructions Yes Do you understand the discharge instrcutions? yes  Where were you discharged to? Home  Do you have support at home? Yes    Items Reviewed:  Medications obtained Yes  Medications reviewed: Yes  Dietary changes reviewed: yes  Home Health? N/A  DME ordered at discharge obtained? NA  Medical supplies: NA    Functional Questionnaire:   Activities of Daily Living (ADLs):   He states they are independent in the following: ambulation, bathing and hygiene, feeding, continence, grooming, toileting, dressing and medication management States they require assistance with the following: none  Any transportation issues/concerns?: no  Any patient concerns? Yes, nausea and vomiting since being in the hospital.   Confirmed importance and date/time of follow-up visits scheduled with PCP: yes, will call patient back with appointment time  Confirm appointment scheduled with specialist? Yes  Confirmed with patient if condition begins to worsen call PCP or If it's emergency go to the ER.

## 2018-07-07 NOTE — Telephone Encounter (Signed)
**Note De-Identified  Obfuscation** Patient contacted regarding discharge from [facility] in [place] on 07/06/18.  Patient understands to follow up with provider Dr Rayann Heman on 07/09/18 at 2:00 at [facility] in [place]. Patient understands discharge instructions? Yes Patient understands medications and regiment? Yes Patient understands to bring all medications to this visit? Yes

## 2018-07-07 NOTE — Telephone Encounter (Signed)
-----   Message from Theodoro Parma, RN sent at 07/07/2018  2:04 PM EDT ----- Regarding: FW: TCM/PH   ----- Message ----- From: Lucienne Minks F Sent: 07/07/2018  11:47 AM To: [facility] [address] Triage Subject: TCM/PH                                         07/09/2018 2:00 DR. Allred

## 2018-07-07 NOTE — Telephone Encounter (Signed)
This encounter was created in error - please disregard.  This encounter was created in error - please disregard.

## 2018-07-08 ENCOUNTER — Telehealth: Payer: Self-pay

## 2018-07-08 NOTE — Telephone Encounter (Signed)
Called and informed patient of hospital follow up appointment for 07/10/2018 at 2pm. Pt confirmed

## 2018-07-09 ENCOUNTER — Ambulatory Visit (INDEPENDENT_AMBULATORY_CARE_PROVIDER_SITE_OTHER): Payer: Commercial Managed Care - PPO | Admitting: Internal Medicine

## 2018-07-09 ENCOUNTER — Encounter: Payer: Self-pay | Admitting: Internal Medicine

## 2018-07-09 VITALS — BP 122/70 | HR 77 | Ht 73.0 in | Wt 237.0 lb

## 2018-07-09 DIAGNOSIS — I493 Ventricular premature depolarization: Secondary | ICD-10-CM

## 2018-07-09 DIAGNOSIS — I428 Other cardiomyopathies: Secondary | ICD-10-CM | POA: Diagnosis not present

## 2018-07-09 DIAGNOSIS — I472 Ventricular tachycardia, unspecified: Secondary | ICD-10-CM

## 2018-07-09 DIAGNOSIS — R001 Bradycardia, unspecified: Secondary | ICD-10-CM | POA: Diagnosis not present

## 2018-07-09 DIAGNOSIS — Z95810 Presence of automatic (implantable) cardiac defibrillator: Secondary | ICD-10-CM

## 2018-07-09 MED ORDER — MEXILETINE HCL 150 MG PO CAPS: 150.0000 mg | ORAL_CAPSULE | Freq: Two times a day (BID) | ORAL | 3 refills | Status: DC

## 2018-07-09 NOTE — Patient Instructions (Addendum)
Medication Instructions:  Your physician recommends that you continue on your current medications as directed. Please refer to the Current Medication list given to you today.  Labwork: None ordered.  Testing/Procedures: None ordered.  Follow-Up: Your physician wants you to follow-up in: 4 weeks with [name] in [place].     Remote monitoring is used to monitor your ICD from home. This monitoring reduces the number of office visits required to check your device to one time per year. It allows [place] to keep an eye on the functioning of your device to ensure it is working properly. You are scheduled for a device check from home on 10/08/2018. You may send your transmission at any time that day. If you have a wireless device, the transmission will be sent automatically. After your physician reviews your transmission, you will receive a postcard with your next transmission date.  Any Other Special Instructions Will Be Listed Below (If Applicable).  We will refer you to Dr. Lenard Galloway at [facility]  If you need a refill on your cardiac medications before your next appointment, please call your pharmacy.

## 2018-07-09 NOTE — Progress Notes (Signed)
PCP: Valerie Roys, DO Primary Cardiologist: Dr Festus Holts CHF:  B[facility] Primary EP: Dr Caryl Comes  Stephen [name] is a 52 y.o. male who presents today for routine electrophysiology followup.  Since his ablation, the patient reports doing reasonably well.  He did have sustained VT for which he was hospitalized at [facility] and seen by Dr Caryl Comes (his note reviewed).  Today, he denies symptoms of palpitations, chest pain, shortness of breath,  lower extremity edema, dizziness, presyncope, syncope, or ICD shocks.  The patient is otherwise without complaint today.   Past Medical History:  Diagnosis Date  . AICD (automatic cardioverter/defibrillator) present   . AKI (acute kidney injury) ([place]) 12/24/2017  . Arrhythmia   . Arthritis    "lower back, knees" (06/10/2018)  . Cardiac arrest ([place]) 12/23/2017   Brief V-fib arrest  . Chest pain 09/08/2017  . CHF (congestive heart failure) (HCC)    nonischemic cardiomyopathy, EF 25%  . Gout    "on daily RX" (06/10/2018)  . Headache    "q couple months" (06/10/2018)  . High cholesterol   . History of kidney stones   . Hypertension   . Influenza A 12/24/2017  . NICM (nonischemic cardiomyopathy) ([place])   . Pneumonia 12/20/2017  . TIA (transient ischemic attack) 06/21/2016   "still affects my memory a little bit" (06/10/2018)   Past Surgical History:  Procedure Laterality Date  . EXTERNAL FIXATION LEG Right ~ 2000   "was going bowlegged; had to brake my leg to fix it"  . HERNIA REPAIR    . ICD IMPLANT N/A 12/30/2017   Procedure: ICD IMPLANT;  Surgeon: Deboraha Sprang, MD;  Location: [place] [facility];  Service: Cardiovascular;  Laterality: N/A;  . LEFT HEART CATH AND CORONARY ANGIOGRAPHY N/A 09/09/2017   Procedure: LEFT HEART CATH AND CORONARY ANGIOGRAPHY;  Surgeon: Teodoro Spray, MD;  Location: [place] [facility];  Service: Cardiovascular;  Laterality: N/A;  . UMBILICAL HERNIA REPAIR  1990s  . V TACH ABLATION  06/10/2018  . V TACH ABLATION N/A  06/10/2018   Procedure: V TACH ABLATION;  Surgeon: Thompson Grayer, MD;  Location: [place] [facility];  Service: Cardiovascular;  Laterality: N/A;  . VASECTOMY      ROS- all systems are reviewed and negative except as per HPI above  Current Outpatient Medications  Medication Sig Dispense Refill  . acetaminophen (TYLENOL) 325 MG tablet Take 2 tablets (650 mg total) by mouth every 6 (six) hours as needed. (Patient taking differently: Take 325-650 mg by mouth every 6 (six) hours as needed for moderate pain. ) 60 tablet 0  . albuterol (PROVENTIL HFA;VENTOLIN HFA) 108 (90 Base) MCG/ACT inhaler Inhale 2 puffs into the lungs every 4 (four) hours as needed for wheezing or shortness of breath. 1 Inhaler 0  . amiodarone (PACERONE) 200 MG tablet Take 400 mg by mouth daily.     Marland Kitchen aspirin EC 81 MG EC tablet Take 1 tablet (81 mg total) by mouth daily. 90 tablet 3  . atorvastatin (LIPITOR) 80 MG tablet Take 1 tablet (80 mg total) by mouth at bedtime.    . Camphor-Eucalyptus-Menthol (CHEST RUB) 4.8-1.2-2.6 % OINT Apply 1 application topically daily as needed (congestion).     . carvedilol (COREG) 3.125 MG tablet Take 1 tablet (3.125 mg total) by mouth 2 (two) times daily with a meal. 60 tablet 0  . clopidogrel (PLAVIX) 75 MG tablet Take 1 tablet (75 mg total) by mouth daily. 30 tablet 5  .  colchicine 0.6 MG tablet Take 0.6 mg by mouth daily as needed (gout flare).     . diclofenac sodium (VOLTAREN) 1 % GEL Apply 2 g topically 4 (four) times daily as needed (pain).     . febuxostat (ULORIC) 40 MG tablet Take 1 tablet (40 mg total) by mouth daily. 30 tablet 3  . fluticasone (FLONASE) 50 MCG/ACT nasal spray Place 2 sprays into both nostrils daily. (Patient taking differently: Place 2 sprays into both nostrils daily as needed for allergies. ) 16 g 2  . Menthol, Topical Analgesic, (ICY HOT EX) Apply 1 application topically daily as needed (pain).    Marland Kitchen mexiletine (MEXITIL) 150 MG capsule Take 2 capsules (300 mg total)  by mouth 2 (two) times daily. 120 capsule 0  . Multiple Vitamin (MULTIVITAMIN WITH MINERALS) TABS tablet Take 1 tablet by mouth daily.    . nitroGLYCERIN (NITROSTAT) 0.4 MG SL tablet Place 1 tablet (0.4 mg total) under the tongue every 5 (five) minutes as needed for chest pain. 25 tablet 3  . potassium chloride SA (K-DUR,KLOR-CON) 20 MEQ tablet Take 1 tablet (20 mEq total) by mouth 2 (two) times daily. 60 tablet 0  . sacubitril-valsartan (ENTRESTO) 24-26 MG Take 1 tablet by mouth 2 (two) times daily. 60 tablet 0  . Tiotropium Bromide Monohydrate (SPIRIVA RESPIMAT) 2.5 MCG/ACT AERS Inhale 1 puff into the lungs daily.     Marland Kitchen torsemide (DEMADEX) 20 MG tablet Take 2 tablets (40 mg total) by mouth daily. 60 tablet 0   No current facility-administered medications for this visit.     Physical Exam: Vitals:   07/09/18 1341  BP: 122/70  Pulse: 77  Weight: 237 lb (107.5 kg)  Height: 6\' 1"  (1.854 m)    GEN- The patient is well appearing, alert and oriented x 3 today.   Head- normocephalic, atraumatic Eyes-  Sclera clear, conjunctiva pink Ears- hearing intact Oropharynx- clear Lungs- Clear to ausculation bilaterally, normal work of breathing Chest- ICD pocket is well healed Heart- Regular rate and rhythm with very frequent ectopy GI- soft, NT, ND, + BS Extremities- no clubbing, cyanosis, or edema  ICD interrogation- reviewed in detail today,  See PACEART report  ekg tracing ordered today is personally reviewed and shows sinus with frequent ventricular ectopy  Wt Readings from Last 3 Encounters:  07/09/18 237 lb (107.5 kg)  07/06/18 232 lb 11.2 oz (105.6 kg)  06/23/18 237 lb 9 oz (107.8 kg)    Assessment and Plan:  1.  VT/ PVCs Continues to have VT and frequent ventricular ectopy post ablation, despite medical therapy Normal ICD function See Pace Art report No changes today Will make referral to Dr Noralee Stain at [facility].  I suspect that his VT source may be epicardial.   He has not  tolerated mexiletine 300mg  BID.  Reduce to 150mg  BID.  Hopefully we can reduce amiodarone soon.  2. Chronic systolic dysfunction (nonischemic CM) Stable No change required today  [facility] Follow-up with Dr Caryl Comes in 4 weeks  Thompson Grayer MD, [facility] 07/09/2018 2:53 PM

## 2018-07-10 ENCOUNTER — Encounter: Payer: Self-pay | Admitting: Family Medicine

## 2018-07-10 ENCOUNTER — Other Ambulatory Visit: Payer: Self-pay

## 2018-07-10 ENCOUNTER — Ambulatory Visit (INDEPENDENT_AMBULATORY_CARE_PROVIDER_SITE_OTHER): Payer: Commercial Managed Care - PPO | Admitting: Family Medicine

## 2018-07-10 ENCOUNTER — Telehealth: Payer: Self-pay | Admitting: Internal Medicine

## 2018-07-10 VITALS — BP 98/58 | HR 68 | Temp 97.9°F | Ht 73.0 in | Wt 236.2 lb

## 2018-07-10 DIAGNOSIS — I5022 Chronic systolic (congestive) heart failure: Secondary | ICD-10-CM | POA: Diagnosis not present

## 2018-07-10 DIAGNOSIS — N179 Acute kidney failure, unspecified: Secondary | ICD-10-CM

## 2018-07-10 DIAGNOSIS — I959 Hypotension, unspecified: Secondary | ICD-10-CM

## 2018-07-10 DIAGNOSIS — I493 Ventricular premature depolarization: Secondary | ICD-10-CM | POA: Diagnosis not present

## 2018-07-10 DIAGNOSIS — I4901 Ventricular fibrillation: Secondary | ICD-10-CM

## 2018-07-10 NOTE — Telephone Encounter (Signed)
The patient saw Dr. Rayann Heman yesterday and the recommendation was made to send him to Dr. Noralee Stain.  To [name] to review.

## 2018-07-10 NOTE — Progress Notes (Signed)
BP (!) 98/58 (BP Location: Left Arm, Cuff Size: Normal)   Pulse 68   Temp 97.9 F (36.6 C) (Oral)   Ht 6\' 1"  (1.854 m)   Wt 236 lb 3.2 oz (107.1 kg)   SpO2 98%   BMI 31.16 kg/m    Subjective:    Patient ID: Stephen [name], male    DOB: 16-Sep-1966, 52 y.o.   MRN: [id number]  HPI: Stephen [name] is a 52 y.o. male  Chief Complaint  Patient presents with  . Hospital F/U   Transition of [facility] Follow up.   Hospital/Facility: [facility] D/C Physician: Loletha Grayer, MD D/C Date: 07/06/18  Records Requested: 07/06/18 Records Received: 07/06/18 Records Reviewed: 07/10/18  Diagnoses on Discharge:  Multifocal PVCs, cardiogenic syncope, sustained VT, AKI  Date of interactive Contact within 48 hours of discharge:  07/07/18 Contact was through: phone  Date of 7 day or 14 day face-to-face visit: 07/10/18   within 7 days  Outpatient Encounter Medications as of 07/10/2018  Medication Sig  . acetaminophen (TYLENOL) 325 MG tablet Take 2 tablets (650 mg total) by mouth every 6 (six) hours as needed. (Patient taking differently: Take 325-650 mg by mouth every 6 (six) hours as needed for moderate pain. )  . albuterol (PROVENTIL HFA;VENTOLIN HFA) 108 (90 Base) MCG/ACT inhaler Inhale 2 puffs into the lungs every 4 (four) hours as needed for wheezing or shortness of breath.  Marland Kitchen amiodarone (PACERONE) 200 MG tablet Take 400 mg by mouth daily.   Marland Kitchen aspirin EC 81 MG EC tablet Take 1 tablet (81 mg total) by mouth daily.  Marland Kitchen atorvastatin (LIPITOR) 80 MG tablet Take 1 tablet (80 mg total) by mouth at bedtime.  . Camphor-Eucalyptus-Menthol (CHEST RUB) 4.8-1.2-2.6 % OINT Apply 1 application topically daily as needed (congestion).   . carvedilol (COREG) 3.125 MG tablet Take 1 tablet (3.125 mg total) by mouth 2 (two) times daily with a meal.  . clopidogrel (PLAVIX) 75 MG tablet Take 1 tablet (75 mg total) by mouth daily.  . colchicine 0.6 MG tablet Take 0.6 mg by mouth daily as needed (gout flare).   .  diclofenac sodium (VOLTAREN) 1 % GEL Apply 2 g topically 4 (four) times daily as needed (pain).   . febuxostat (ULORIC) 40 MG tablet Take 1 tablet (40 mg total) by mouth daily.  . fluticasone (FLONASE) 50 MCG/ACT nasal spray Place 2 sprays into both nostrils daily. (Patient taking differently: Place 2 sprays into both nostrils daily as needed for allergies. )  . Menthol, Topical Analgesic, (ICY HOT EX) Apply 1 application topically daily as needed (pain).  Marland Kitchen mexiletine (MEXITIL) 150 MG capsule Take 1 capsule (150 mg total) by mouth 2 (two) times daily.  . Multiple Vitamin (MULTIVITAMIN WITH MINERALS) TABS tablet Take 1 tablet by mouth daily.  . nitroGLYCERIN (NITROSTAT) 0.4 MG SL tablet Place 1 tablet (0.4 mg total) under the tongue every 5 (five) minutes as needed for chest pain.  . potassium chloride SA (K-DUR,KLOR-CON) 20 MEQ tablet Take 1 tablet (20 mEq total) by mouth 2 (two) times daily.  . sacubitril-valsartan (ENTRESTO) 24-26 MG Take 1 tablet by mouth 2 (two) times daily.  . Tiotropium Bromide Monohydrate (SPIRIVA RESPIMAT) 2.5 MCG/ACT AERS Inhale 1 puff into the lungs daily.   Marland Kitchen torsemide (DEMADEX) 20 MG tablet Take 2 tablets (40 mg total) by mouth daily.   No facility-administered encounter medications on file as of 07/10/2018.     Diagnostic Tests Reviewed:  EXAM: PORTABLE CHEST  1 VIEW  COMPARISON:  Chest x-ray dated March 19, 2018.  FINDINGS: Unchanged single lead left chest wall pacer device. Stable moderate cardiomegaly. Pulmonary vascular congestion has resolved. No focal consolidation, pleural effusion, or pneumothorax. No acute osseous abnormality.  IMPRESSION: Stable moderate cardiomegaly.  No edema or effusion.  ECHO:  - Left ventricle: The cavity size was severely dilated. Wall   thickness was increased in a pattern of mild LVH. The estimated   ejection fraction was in the range of 10% to 15%. Diffuse   hypokinesis. Regional wall motion abnormalities cannot  be   excluded. The study is not technically sufficient to allow   evaluation of LV diastolic function. - Aortic valve: There was trivial regurgitation. - Mitral valve: There was mild regurgitation. - Left atrium: The atrium was moderately dilated. - Right ventricle: The cavity size was mildly dilated. Wall   thickness was mildly increased. Systolic function was normal. - Right atrium: The atrium was mildly dilated.  Disposition: Home  HOSPITAL COURSE:   1.  Acute kidney injury on chronic kidney disease stage III.  Continue to hold Spironolactone and metolazone.  Creatinine today the same as yesterday.  Creatinine 1.97 upon discharge.  Creatinine in April and May range between 1.38 and 1.61.  Creatinine when he came in this time was 1.88.  Creatinine peaked at 2.27.  Upon discharge 1.97. 2.  Relative hypotension.  We do like blood pressure on the lower side with people with heart failure.  Patient asymptomatic with the low blood pressure. 3.  Syncope with ventricular fibrillation.  Mexiletine increased to 300 mg twice a day.  Coreg decreased to 3.125 mg twice a day.  Patient on amiodarone 400 mg daily. 4.  Nonischemic cardiomyopathy with low ejection fraction.  EF 10 to 15% on last echocardiogram. 5.  Chronic systolic congestive heart failure.  Cardiology decreased the dose of torsemide to once a day dosing.  Patient on low-dose Coreg and Entresto.  Aldactone was held due to acute kidney injury. 6.  Nausea.  Ranexa was discontinued and so was metolazone.  Sister stated that his nausea started with the starting of this medication metolazone. 7.  History of CVA on aspirin and Plavix and statin 8.  History of gout on colchicine and Uloric 9.  History of COPD on Spiriva  Consults: Cardiology Fletcher Anon)  Discharge Instructions: Follow up here and with cardiology  Disease/illness Education: Given in Writing  Home Health/Community Services Discussions/Referrals: N/A  Establishment or  re-establishment of referral orders for community resources: N/A  Discussion with other health care providers: None  Assessment and Support of treatment regimen adherence: Doing well with treatment regimen  Appointments Coordinated with: Patient  Education for self-management, independent living, and ADLs: N/A  Saw Cardiology yesterday for routine follow up. They are referring him to [place] for evaluation- they think his VT may be from an epicardial source. They reduced his mexiletine to 150mg  BID from 300mg  BID and are hoping to reduce his amiodarone soon.  He states that he is feeling OK, but not 100%. He notes that his sister is a bit anxious about the thought of him having a surgery and is anxious about him having surgery at an outside hospital. He notes that while he was in the hospital he was taking his medicine on an empty stomach, and that was making him nauseous and swimmy headed. He is doing better since he is at home taking the medicine with food. Has been drinking some cranberry juice. He  is doing OK. He notes that he is tired because he's frustrated about what's going on. He wants to get back on his feet and trying to be self-suffiencienct. He is anxious about having to go back to work in December.   Relevant past medical, surgical, family and social history reviewed and updated as indicated. Interim medical history since our last visit reviewed. Allergies and medications reviewed and updated.  Review of Systems  Constitutional: Negative.   Respiratory: Positive for shortness of breath (only with walking and talking on the phone for a while). Negative for apnea, cough, choking, chest tightness, wheezing and stridor.   Cardiovascular: Negative.  Negative for chest pain, palpitations and leg swelling.  Gastrointestinal: Negative.   Psychiatric/Behavioral: Negative.     Per HPI unless specifically indicated above     Objective:    BP (!) 98/58 (BP Location: Left Arm, Cuff  Size: Normal)   Pulse 68   Temp 97.9 F (36.6 C) (Oral)   Ht 6\' 1"  (1.854 m)   Wt 236 lb 3.2 oz (107.1 kg)   SpO2 98%   BMI 31.16 kg/m   Wt Readings from Last 3 Encounters:  07/10/18 236 lb 3.2 oz (107.1 kg)  07/09/18 237 lb (107.5 kg)  07/06/18 232 lb 11.2 oz (105.6 kg)    Physical Exam  Constitutional: He is oriented to person, place, and time. He appears well-developed and well-nourished. No distress.  HENT:  Head: Normocephalic and atraumatic.  Right Ear: Hearing normal.  Left Ear: Hearing normal.  Nose: Nose normal.  Eyes: Conjunctivae and lids are normal. Right eye exhibits no discharge. Left eye exhibits no discharge. No scleral icterus.  Cardiovascular: Normal rate, regular rhythm, normal heart sounds and intact distal pulses. Exam reveals no gallop and no friction rub.  No murmur heard. Pulmonary/Chest: Effort normal and breath sounds normal. No stridor. No respiratory distress. He has no wheezes. He has no rales. He exhibits no tenderness.  Musculoskeletal: Normal range of motion.  Neurological: He is alert and oriented to person, place, and time.  Skin: Skin is warm, dry and intact. Capillary refill takes less than 2 seconds. No rash noted. He is not diaphoretic. No erythema. No pallor.  Psychiatric: He has a normal mood and affect. His speech is normal and behavior is normal. Judgment and thought content normal. Cognition and memory are normal.  Nursing note and vitals reviewed.   Results for orders placed or performed during the hospital encounter of 02/54/27  Basic metabolic panel  Result Value Ref Range   Sodium 139 135 - 145 mmol/L   Potassium 3.6 3.5 - 5.1 mmol/L   Chloride 101 98 - 111 mmol/L   CO2 24 22 - 32 mmol/L   Glucose, Bld 153 (H) 70 - 99 mg/dL   BUN 31 (H) 6 - 20 mg/dL   Creatinine, Ser 1.88 (H) 0.61 - 1.24 mg/dL   Calcium 9.3 8.9 - 10.3 mg/dL   GFR calc non Af Amer 39 (L) >60 mL/min   GFR calc Af Amer 45 (L) >60 mL/min   Anion gap 14 5 - 15    CBC  Result Value Ref Range   WBC 9.6 3.8 - 10.6 K/uL   RBC 5.51 4.40 - 5.90 MIL/uL   Hemoglobin 16.1 13.0 - 18.0 g/dL   HCT 48.2 40.0 - 52.0 %   MCV 87.4 80.0 - 100.0 fL   MCH 29.3 26.0 - 34.0 pg   MCHC 33.5 32.0 - 36.0 g/dL   RDW  14.2 11.5 - 14.5 %   Platelets 249 150 - 440 K/uL  Urinalysis, Complete w Microscopic  Result Value Ref Range   Color, Urine YELLOW (A) YELLOW   APPearance CLEAR (A) CLEAR   Specific Gravity, Urine 1.008 1.005 - 1.030   pH 6.0 5.0 - 8.0   Glucose, UA NEGATIVE NEGATIVE mg/dL   Hgb urine dipstick NEGATIVE NEGATIVE   Bilirubin Urine NEGATIVE NEGATIVE   Ketones, ur NEGATIVE NEGATIVE mg/dL   Protein, ur NEGATIVE NEGATIVE mg/dL   Nitrite NEGATIVE NEGATIVE   Leukocytes, UA NEGATIVE NEGATIVE   RBC / HPF 0-5 0 - 5 RBC/hpf   WBC, UA NONE SEEN 0 - 5 WBC/hpf   Bacteria, UA NONE SEEN NONE SEEN   Squamous Epithelial / LPF 0-5 0 - 5   Mucus PRESENT    Hyaline Casts, UA PRESENT   Glucose, capillary  Result Value Ref Range   Glucose-Capillary 154 (H) 70 - 99 mg/dL  Troponin I  Result Value Ref Range   Troponin I 0.08 (HH) <0.03 ng/mL  Magnesium  Result Value Ref Range   Magnesium 1.9 1.7 - 2.4 mg/dL  Brain natriuretic peptide  Result Value Ref Range   B Natriuretic Peptide 549.0 (H) 0.0 - 100.0 pg/mL  CBC  Result Value Ref Range   WBC 9.0 3.8 - 10.6 K/uL   RBC 4.90 4.40 - 5.90 MIL/uL   Hemoglobin 14.6 13.0 - 18.0 g/dL   HCT 42.5 40.0 - 52.0 %   MCV 86.7 80.0 - 100.0 fL   MCH 29.7 26.0 - 34.0 pg   MCHC 34.3 32.0 - 36.0 g/dL   RDW 14.1 11.5 - 14.5 %   Platelets 213 150 - 440 K/uL  Troponin I  Result Value Ref Range   Troponin I 0.10 (HH) <0.03 ng/mL  Troponin I  Result Value Ref Range   Troponin I 0.10 (HH) <0.03 ng/mL  Troponin I  Result Value Ref Range   Troponin I 0.09 (HH) <0.03 ng/mL  TSH  Result Value Ref Range   TSH 0.666 0.350 - 4.500 uIU/mL  Basic metabolic panel  Result Value Ref Range   Sodium 138 135 - 145 mmol/L   Potassium  3.7 3.5 - 5.1 mmol/L   Chloride 101 98 - 111 mmol/L   CO2 26 22 - 32 mmol/L   Glucose, Bld 118 (H) 70 - 99 mg/dL   BUN 43 (H) 6 - 20 mg/dL   Creatinine, Ser 1.79 (H) 0.61 - 1.24 mg/dL   Calcium 9.4 8.9 - 10.3 mg/dL   GFR calc non Af Amer 42 (L) >60 mL/min   GFR calc Af Amer 48 (L) >60 mL/min   Anion gap 11 5 - 15  Magnesium  Result Value Ref Range   Magnesium 2.2 1.7 - 2.4 mg/dL  Basic metabolic panel  Result Value Ref Range   Sodium 136 135 - 145 mmol/L   Potassium 4.0 3.5 - 5.1 mmol/L   Chloride 95 (L) 98 - 111 mmol/L   CO2 28 22 - 32 mmol/L   Glucose, Bld 117 (H) 70 - 99 mg/dL   BUN 49 (H) 6 - 20 mg/dL   Creatinine, Ser 2.27 (H) 0.61 - 1.24 mg/dL   Calcium 9.2 8.9 - 10.3 mg/dL   GFR calc non Af Amer 31 (L) >60 mL/min   GFR calc Af Amer 36 (L) >60 mL/min   Anion gap 13 5 - 15  Basic metabolic panel  Result Value Ref Range   Sodium  137 135 - 145 mmol/L   Potassium 4.6 3.5 - 5.1 mmol/L   Chloride 100 98 - 111 mmol/L   CO2 28 22 - 32 mmol/L   Glucose, Bld 110 (H) 70 - 99 mg/dL   BUN 41 (H) 6 - 20 mg/dL   Creatinine, Ser 1.96 (H) 0.61 - 1.24 mg/dL   Calcium 9.3 8.9 - 10.3 mg/dL   GFR calc non Af Amer 37 (L) >60 mL/min   GFR calc Af Amer 43 (L) >60 mL/min   Anion gap 9 5 - 15  Basic metabolic panel  Result Value Ref Range   Sodium 138 135 - 145 mmol/L   Potassium 4.4 3.5 - 5.1 mmol/L   Chloride 100 98 - 111 mmol/L   CO2 29 22 - 32 mmol/L   Glucose, Bld 108 (H) 70 - 99 mg/dL   BUN 37 (H) 6 - 20 mg/dL   Creatinine, Ser 1.97 (H) 0.61 - 1.24 mg/dL   Calcium 9.4 8.9 - 10.3 mg/dL   GFR calc non Af Amer 37 (L) >60 mL/min   GFR calc Af Amer 43 (L) >60 mL/min   Anion gap 9 5 - 15  ECHOCARDIOGRAM COMPLETE  Result Value Ref Range   Weight 3,776 oz   Height 73 in   BP 104/84 mmHg      Assessment & Plan:   Problem List Items Addressed This Visit      Cardiovascular and Mediastinum   Chronic systolic congestive heart failure (HCC)    Would like to get back in to see  [name] at the CHF clinic- referral generated today.      Relevant Orders   Ambulatory referral to Cardiology   Frequent PVCs    To see EP specialist at [facility]. Continue to monitor. Call with any concerns. Continue to follow with cardiology.      VF (ventricular fibrillation) ([place])    To see EP specialist at [facility]. Continue to monitor. Call with any concerns. Continue to follow with cardiology.       Other Visit Diagnoses    AKI (acute kidney injury) ([place])    -  Primary   Rechecking levels today. Await results. Continue fluids.    Relevant Orders   Basic metabolic panel   Hypotension, unspecified hypotension type       Not symptomatic. Improved with some chips and a couple of glasses of water. Continue to follow with cardiology. Fluids in the heat.        Follow up plan: Return in about 1 month (around 08/07/2018).

## 2018-07-10 NOTE — Assessment & Plan Note (Signed)
To see EP specialist at [facility]. Continue to monitor. Call with any concerns. Continue to follow with cardiology.

## 2018-07-10 NOTE — Assessment & Plan Note (Signed)
To see EP specialist at [facility]. Continue to monitor. Call with any concerns. Continue to follow with cardiology.

## 2018-07-10 NOTE — Assessment & Plan Note (Signed)
Would like to get back in to see Stephen [name] at the CHF clinic- referral generated today.

## 2018-07-10 NOTE — Telephone Encounter (Signed)
Pt sister is calling regarding the appt with Dr. Rayann Heman. She states Dr. Rayann Heman wants to send his brother "to another Dr." and she has some concerns about that. Please call to discuss.

## 2018-07-10 NOTE — Telephone Encounter (Signed)
Seen today. 

## 2018-07-11 LAB — BASIC METABOLIC PANEL
BUN / CREAT RATIO: 14 (ref 9–20)
BUN: 25 mg/dL — AB (ref 6–24)
CALCIUM: 9.7 mg/dL (ref 8.7–10.2)
CHLORIDE: 96 mmol/L (ref 96–106)
CO2: 27 mmol/L (ref 20–29)
Creatinine, Ser: 1.85 mg/dL — ABNORMAL HIGH (ref 0.76–1.27)
GFR calc non Af Amer: 41 mL/min/{1.73_m2} — ABNORMAL LOW (ref >59)
GFR, EST AFRICAN AMERICAN: 47 mL/min/{1.73_m2} — AB (ref >59)
GLUCOSE: 93 mg/dL (ref 65–99)
Potassium: 4 mmol/L (ref 3.5–5.2)
Sodium: 141 mmol/L (ref 134–144)

## 2018-07-11 NOTE — Telephone Encounter (Signed)
Pt sister is calling back from yesterday

## 2018-07-11 NOTE — Telephone Encounter (Signed)
Called sister re referral to Dr Iverson Alamin

## 2018-07-11 NOTE — Telephone Encounter (Signed)
Called and spoke with sister at great length regarding concerns about brother, incl "too rapid referral " for repeat ablation and maybe it will get better by itself as the ablation heals, maybe COPD is contributing, maybe amio needs more time  She noted that her brotyher wanted to go back and see Jonnie Finner --[place] as she could help simplifying his meds, and suggested to her that her brother would be helpded considerably if she were able to attend the appt on 7/24 with Dr DB  She said she would try

## 2018-07-14 LAB — CUP PACEART INCLINIC DEVICE CHECK
Battery Remaining Longevity: 134 mo
Brady Statistic RV Percent Paced: 0.02 %
Date Time Interrogation Session: 20190710172951
HIGH POWER IMPEDANCE MEASURED VALUE: 78 Ohm
Lead Channel Impedance Value: 342 Ohm
Lead Channel Impedance Value: 456 Ohm
Lead Channel Pacing Threshold Amplitude: 1.75 V
Lead Channel Setting Sensing Sensitivity: 0.3 mV
MDC IDC LEAD IMPLANT DT: 20181231
MDC IDC LEAD LOCATION: 753860
MDC IDC MSMT BATTERY VOLTAGE: 3.02 V
MDC IDC MSMT LEADCHNL RV PACING THRESHOLD PULSEWIDTH: 0.4 ms
MDC IDC MSMT LEADCHNL RV SENSING INTR AMPL: 8.125 mV
MDC IDC PG IMPLANT DT: 20181231
MDC IDC SET LEADCHNL RV PACING AMPLITUDE: 2.5 V
MDC IDC SET LEADCHNL RV PACING PULSEWIDTH: 1 ms

## 2018-07-15 ENCOUNTER — Telehealth: Payer: Self-pay

## 2018-07-15 NOTE — Telephone Encounter (Signed)
Prior authroization for Hydrocortisone Acetate 25 mg suppositories initiated via [email or web address]

## 2018-07-16 ENCOUNTER — Encounter: Payer: Self-pay | Admitting: Gastroenterology

## 2018-07-16 ENCOUNTER — Ambulatory Visit (INDEPENDENT_AMBULATORY_CARE_PROVIDER_SITE_OTHER): Payer: Commercial Managed Care - PPO | Admitting: Gastroenterology

## 2018-07-16 ENCOUNTER — Other Ambulatory Visit: Payer: Self-pay

## 2018-07-16 VITALS — BP 96/68 | HR 71 | Resp 17 | Ht 73.0 in | Wt 240.8 lb

## 2018-07-16 DIAGNOSIS — R140 Abdominal distension (gaseous): Secondary | ICD-10-CM

## 2018-07-16 DIAGNOSIS — K625 Hemorrhage of anus and rectum: Secondary | ICD-10-CM | POA: Diagnosis not present

## 2018-07-16 NOTE — Progress Notes (Signed)
Stephen Darby, MD [address]  [place]  [place], [place] [address]  Main: [phone]  Fax: [phone]    Gastroenterology Consultation  Referring Provider:     Valerie Roys, Stephen [name] Primary Care Physician:  Stephen Roys, Stephen [name] Primary Gastroenterologist:  Dr. Cephas [name] Reason for Consultation:     Rectal bleeding        HPI:   Stephen [name] is a 52 y.o. male referred by Dr. Wynetta [name], Stephen Merino, Stephen [name]  for consultation & management of rectal bleeding. Patient with strong cardiac history including nonischemic cardiomyopathy, history of V. Fib arrest, currently has MDT ICD, history of TIA, on aspirin and Plavix. Patient noticed rectal bleeding about 2 months ago. He had FOBT positive. He never had a colonoscopy. He reports having regular bowel movements.   He is closely followed by cardiology in [place]. He was recently admitted about 2 weeks ago to [facility] secondary to syncope thought to be from ventricular fibrillation that was treated successfully with ATP without shock. He also underwent EPS with ablation in 05/2018.  Patient denies any residual weakness from TIA  NSAIDs: none  Antiplts/Anticoagulants/Anti thrombotics: aspirin and Plavix daily history of TIA for the last 1 year  GI Procedures: none He denies family history of GI malignancy  Past Medical History:  Diagnosis Date  . AICD (automatic cardioverter/defibrillator) present   . AKI (acute kidney injury) ([place]) 12/24/2017  . Arrhythmia   . Arthritis    "lower back, knees" (06/10/2018)  . Cardiac arrest ([place]) 12/23/2017   Brief V-fib arrest  . Chest pain 09/08/2017  . CHF (congestive heart failure) (HCC)    nonischemic cardiomyopathy, EF 25%  . Gout    "on daily RX" (06/10/2018)  . Headache    "q couple months" (06/10/2018)  . High cholesterol   . History of kidney stones   . Hypertension   . Influenza A 12/24/2017  . NICM (nonischemic cardiomyopathy) ([place])   .  Pneumonia 12/20/2017  . TIA (transient ischemic attack) 06/21/2016   "still affects my memory a little bit" (06/10/2018)    Past Surgical History:  Procedure Laterality Date  . EXTERNAL FIXATION LEG Right ~ 2000   "was going bowlegged; had to brake my leg to fix it"  . HERNIA REPAIR    . ICD IMPLANT N/A 12/30/2017   Procedure: ICD IMPLANT;  Surgeon: Deboraha Sprang, MD;  Location: [facility];  Service: Cardiovascular;  Laterality: N/A;  . LEFT HEART CATH AND CORONARY ANGIOGRAPHY N/A 09/09/2017   Procedure: LEFT HEART CATH AND CORONARY ANGIOGRAPHY;  Surgeon: Teodoro Spray, MD;  Location: [facility];  Service: Cardiovascular;  Laterality: N/A;  . UMBILICAL HERNIA REPAIR  1990s  . V TACH ABLATION  06/10/2018  . V TACH ABLATION N/A 06/10/2018   Procedure: V TACH ABLATION;  Surgeon: Thompson Grayer, MD;  Location: [facility];  Service: Cardiovascular;  Laterality: N/A;  . VASECTOMY      Current Outpatient Medications:  .  acetaminophen (TYLENOL) 325 MG tablet, Take 2 tablets (650 mg total) by mouth every 6 (six) hours as needed. (Patient taking differently: Take 325-650 mg by mouth every 6 (six) hours as needed for moderate pain. ), Disp: 60 tablet, Rfl: 0 .  albuterol (PROVENTIL HFA;VENTOLIN HFA) 108 (90 Base) MCG/ACT inhaler, Inhale 2 puffs into the lungs every 4 (four) hours as needed for wheezing or shortness of breath., Disp: 1 Inhaler, Rfl: 0 .  amiodarone (PACERONE) 200 MG tablet, Take 400 mg by mouth daily. , Disp: , Rfl:  .  aspirin EC 81 MG EC tablet, Take 1 tablet (81 mg total) by mouth daily., Disp: 90 tablet, Rfl: 3 .  atorvastatin (LIPITOR) 80 MG tablet, Take 1 tablet (80 mg total) by mouth at bedtime., Disp: , Rfl:  .  Camphor-Eucalyptus-Menthol (CHEST RUB) 4.8-1.2-2.6 % OINT, Apply 1 application topically daily as needed (congestion). , Disp: , Rfl:  .  carvedilol (COREG) 3.125 MG tablet, Take 1 tablet (3.125 mg total) by mouth 2 (two) times daily with a  meal., Disp: 60 tablet, Rfl: 0 .  clopidogrel (PLAVIX) 75 MG tablet, Take 1 tablet (75 mg total) by mouth daily., Disp: 30 tablet, Rfl: 5 .  colchicine 0.6 MG tablet, Take 0.6 mg by mouth daily as needed (gout flare). , Disp: , Rfl:  .  diclofenac sodium (VOLTAREN) 1 % GEL, Apply 2 g topically 4 (four) times daily as needed (pain). , Disp: , Rfl:  .  febuxostat (ULORIC) 40 MG tablet, Take 1 tablet (40 mg total) by mouth daily., Disp: 30 tablet, Rfl: 3 .  fluticasone (FLONASE) 50 MCG/ACT nasal spray, Place 2 sprays into both nostrils daily. (Patient taking differently: Place 2 sprays into both nostrils daily as needed for allergies. ), Disp: 16 g, Rfl: 2 .  Menthol, Topical Analgesic, (ICY HOT EX), Apply 1 application topically daily as needed (pain)., Disp: , Rfl:  .  mexiletine (MEXITIL) 150 MG capsule, Take 1 capsule (150 mg total) by mouth 2 (two) times daily., Disp: 180 capsule, Rfl: 3 .  Multiple Vitamin (MULTIVITAMIN WITH MINERALS) TABS tablet, Take 1 tablet by mouth daily., Disp: , Rfl:  .  nitroGLYCERIN (NITROSTAT) 0.4 MG SL tablet, Place 1 tablet (0.4 mg total) under the tongue every 5 (five) minutes as needed for chest pain., Disp: 25 tablet, Rfl: 3 .  potassium chloride SA (K-DUR,KLOR-CON) 20 MEQ tablet, Take 1 tablet (20 mEq total) by mouth 2 (two) times daily., Disp: 60 tablet, Rfl: 0 .  sacubitril-valsartan (ENTRESTO) 24-26 MG, Take 1 tablet by mouth 2 (two) times daily., Disp: 60 tablet, Rfl: 0 .  Tiotropium Bromide Monohydrate (SPIRIVA RESPIMAT) 2.5 MCG/ACT AERS, Inhale 1 puff into the lungs daily. , Disp: , Rfl:  .  torsemide (DEMADEX) 20 MG tablet, Take 2 tablets (40 mg total) by mouth daily., Disp: 60 tablet, Rfl: 0    Family History  Problem Relation Age of Onset  . Hypertension Mother   . Heart failure Mother   . Hypertension Father   . CAD Father   . Heart attack Father      Social History   Tobacco Use  . Smoking status: Former Smoker    Packs/day: 0.33    Years:  33.00    Pack years: 10.89    Types: Cigarettes    Last attempt to quit: 02/20/2016    Years since quitting: 2.4  . Smokeless tobacco: Never Used  Substance Use Topics  . Alcohol use: Yes    Alcohol/week: 0.0 oz    Comment: 06/10/2018 "beer once/month; if that"  . Drug use: Never    Allergies as of 07/16/2018 - Review Complete 07/16/2018  Allergen Reaction Noted  . Lisinopril Cough 06/21/2016    Review of Systems:    All systems reviewed and negative except where noted in HPI.   Physical Exam:  BP 96/68 (BP Location: Left Arm, Patient Position: Sitting, Cuff Size: Large)   Pulse 71  Resp 17   Ht 6\' 1"  (1.854 m)   Wt 240 lb 12.8 oz (109.2 kg)   BMI 31.77 kg/m  No LMP for male patient.  General:   Alert,  Well-developed, well-nourished, pleasant and cooperative in NAD Head:  Normocephalic and atraumatic. Eyes:  Sclera clear, no icterus.   Conjunctiva pink. Ears:  Normal auditory acuity. Nose:  No deformity, discharge, or lesions. Mouth:  No deformity or lesions,oropharynx pink & moist. Neck:  Supple; no masses or thyromegaly. Lungs:  Respirations even and unlabored.  Clear throughout to auscultation.   No wheezes, crackles, or rhonchi. No acute distress. Heart:  Regular rate and rhythm; no murmurs, clicks, rubs, or gallops. Abdomen:  Normal bowel sounds. Soft, non-tender and distended, increased abdominal girth from central obesity, redundant skin in the lower abdomen secondary to weight loss, without masses, hepatosplenomegaly or hernias noted.  No guarding or rebound tenderness.   Rectal: Not performed Msk:  Symmetrical without gross deformities. Good, equal movement & strength bilaterally. Pulses:  Normal pulses noted. Extremities:  No clubbing or edema.  No cyanosis. Neurologic:  Alert and oriented x3;  grossly normal neurologically. Skin:  Intact without significant lesions or rashes. No jaundice. Lymph Nodes:  No significant cervical adenopathy. Psych:  Alert and  cooperative. Normal mood and affect.  Imaging Studies: CT A/P in 2017 no significant GI pathology  Assessment and Plan:   Stephen [name] is a 35 y.o. african-American male with history of nonischemic cardiomyopathy, V. Fib arrest, s/p MDT ICD s/p EPS with ablation in 05/2018, TIA in 2017 on DAPT seen in consultation for rectal bleeding and FOBT positive. Patient did not have a colonoscopy to date. Patient also reports abdominal distention and appears out of proportion to his height and unable to lose abdominal fat despite following healthy diet and exercise. He had history of recurrent hernia repair.  Recommend colonoscopy for further evaluation after cardiac clearance. Patient will need to be off Plavix for 5 days prior to the procedure. Will send request to his cardiologist for clearance Schedule colonoscopy after the cardiac clearance only  Generalized abdominal distention: probably from central obesity. Ultrasound and CT in 2017 did not reveal any GI pathology Perform Ultrasound abdomen   Follow up as needed   Stephen Darby, MD

## 2018-07-21 ENCOUNTER — Ambulatory Visit: Payer: Commercial Managed Care - PPO

## 2018-07-23 ENCOUNTER — Ambulatory Visit (HOSPITAL_COMMUNITY)
Admission: RE | Admit: 2018-07-23 | Discharge: 2018-07-23 | Disposition: A | Discharge status: Home / Self Care | Payer: Commercial Managed Care - PPO | Source: Ambulatory Visit | Attending: Internal Medicine | Admitting: Internal Medicine

## 2018-07-23 VITALS — BP 102/54 | HR 86 | Wt 238.4 lb

## 2018-07-23 DIAGNOSIS — Z8674 Personal history of sudden cardiac arrest: Secondary | ICD-10-CM | POA: Insufficient documentation

## 2018-07-23 DIAGNOSIS — Z7982 Long term (current) use of aspirin: Secondary | ICD-10-CM | POA: Insufficient documentation

## 2018-07-23 DIAGNOSIS — Z888 Allergy status to other drugs, medicaments and biological substances status: Secondary | ICD-10-CM | POA: Diagnosis not present

## 2018-07-23 DIAGNOSIS — I130 Hypertensive heart and chronic kidney disease with heart failure and stage 1 through stage 4 chronic kidney disease, or unspecified chronic kidney disease: Secondary | ICD-10-CM | POA: Diagnosis not present

## 2018-07-23 DIAGNOSIS — I2510 Atherosclerotic heart disease of native coronary artery without angina pectoris: Secondary | ICD-10-CM | POA: Insufficient documentation

## 2018-07-23 DIAGNOSIS — E7800 Pure hypercholesterolemia, unspecified: Secondary | ICD-10-CM | POA: Diagnosis not present

## 2018-07-23 DIAGNOSIS — M109 Gout, unspecified: Secondary | ICD-10-CM | POA: Insufficient documentation

## 2018-07-23 DIAGNOSIS — I428 Other cardiomyopathies: Secondary | ICD-10-CM | POA: Insufficient documentation

## 2018-07-23 DIAGNOSIS — I5022 Chronic systolic (congestive) heart failure: Secondary | ICD-10-CM | POA: Insufficient documentation

## 2018-07-23 DIAGNOSIS — Z8673 Personal history of transient ischemic attack (TIA), and cerebral infarction without residual deficits: Secondary | ICD-10-CM | POA: Insufficient documentation

## 2018-07-23 DIAGNOSIS — Z79899 Other long term (current) drug therapy: Secondary | ICD-10-CM | POA: Insufficient documentation

## 2018-07-23 DIAGNOSIS — N183 Chronic kidney disease, stage 3 (moderate): Secondary | ICD-10-CM | POA: Diagnosis not present

## 2018-07-23 DIAGNOSIS — I472 Ventricular tachycardia, unspecified: Secondary | ICD-10-CM

## 2018-07-23 DIAGNOSIS — Z95810 Presence of automatic (implantable) cardiac defibrillator: Secondary | ICD-10-CM | POA: Diagnosis not present

## 2018-07-23 DIAGNOSIS — Z87891 Personal history of nicotine dependence: Secondary | ICD-10-CM | POA: Insufficient documentation

## 2018-07-23 DIAGNOSIS — I493 Ventricular premature depolarization: Secondary | ICD-10-CM | POA: Diagnosis not present

## 2018-07-23 LAB — BASIC METABOLIC PANEL
ANION GAP: 10 (ref 5–15)
BUN: 19 mg/dL (ref 6–20)
CHLORIDE: 108 mmol/L (ref 98–111)
CO2: 22 mmol/L (ref 22–32)
CREATININE: 1.52 mg/dL — AB (ref 0.61–1.24)
Calcium: 9.7 mg/dL (ref 8.9–10.3)
GFR calc non Af Amer: 51 mL/min — ABNORMAL LOW (ref >60)
GFR, EST AFRICAN AMERICAN: 59 mL/min — AB (ref >60)
Glucose, Bld: 118 mg/dL — ABNORMAL HIGH (ref 70–99)
POTASSIUM: 4.4 mmol/L (ref 3.5–5.1)
Sodium: 140 mmol/L (ref 135–145)

## 2018-07-23 NOTE — Patient Instructions (Addendum)
Increase Torsemide to 40 mg Twice daily FOR 2 DAYS ONLY  Take 1 extra Potassium tab for 2 DAYS ONLY when you take extra Torsemide  Lab today  Your physician has recommended that you have a cardiopulmonary stress test (CPX). CPX testing is a non-invasive measurement of heart and lung function. It replaces a traditional treadmill stress test. This type of test provides a tremendous amount of information that relates not only to your present condition but also for future outcomes. This test combines measurements of you ventilation, respiratory gas exchange in the lungs, electrocardiogram (EKG), blood pressure and physical response before, during, and following an exercise protocol.  Your physician recommends that you schedule a follow-up appointment in: 2 weeks

## 2018-07-23 NOTE — Progress Notes (Addendum)
Advanced Heart Failure Clinic Note   Primary Care: [name], MD Primary Cardiologist: Dr Fletcher Anon Primary EP: [name]  HPI: Stephen [name] is a 52 y.o. 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   Has several year history of systolic HF due to NICM with EF 15-20%. 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 Followed originally by [name] then transferred to Dr. Fletcher Anon. 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   Admitted 12/23/17 -> 12/31/17. Pt [facility] to [facility], but transferred to [facility] for management of worsening HF. Shock, Influenzae A, and VF arrest. Swan placed. Started on NE and milrinone with improved output. Pt had multiple episodes of VT. S/p MDT ICD 12/30/17 Medtronic.   Had PVC ablation on 06/10/18 with Dr. Rayann Heman. PVC burden reduced but still with significant PVCs. Had CPX scheduled after that but still having a lot of PVCs so we deferred .  Admitted to [facility] on July 1 with syncope. Found to have VF treated with ATP. Seen by EP and mexilitene was added to amio. K was 3.7. Also was a bit dry and metolazone and spiro held. Creatinine peaked at 2.2.   Here with his sister. Says he feels ok. Taking torsemide 40mg . Says he has noticed wheezing at night for past couple of days. Mild DOE. Still working for [employer]. Feels weak.Can do ADLs but not much else without SOB   ICD: Fluid level up. 1 episode VF 7/1 Activity 1-2h/day  Echo 06/30/18 EF 10-15% RV ok   Cardiac MRI 12/28/2017: severe LV dilation, EF 16%, mildly decreased RV systolic function, small area of mid-wall LGE in the mid inferolateral wall.   Review of systems complete and found to be negative unless listed in HPI.   Past Medical History:  Diagnosis Date  . AICD (automatic cardioverter/defibrillator) present   . AKI (acute kidney injury) ([place]) 12/24/2017  . Arrhythmia   . Arthritis    "lower back, knees" (06/10/2018)  . Cardiac arrest ([place]) 12/23/2017   Brief V-fib arrest  . Chest pain 09/08/2017  .  CHF (congestive heart failure) (HCC)    nonischemic cardiomyopathy, EF 25%  . Gout    "on daily RX" (06/10/2018)  . Headache    "q couple months" (06/10/2018)  . High cholesterol   . History of kidney stones   . Hypertension   . Influenza A 12/24/2017  . NICM (nonischemic cardiomyopathy) ([place])   . Pneumonia 12/20/2017  . TIA (transient ischemic attack) 06/21/2016   "still affects my memory a little bit" (06/10/2018)   Current Outpatient Medications  Medication Sig Dispense Refill  . acetaminophen (TYLENOL) 325 MG tablet Take 2 tablets (650 mg total) by mouth every 6 (six) hours as needed. (Patient taking differently: Take 325-650 mg by mouth every 6 (six) hours as needed for moderate pain. ) 60 tablet 0  . albuterol (PROVENTIL HFA;VENTOLIN HFA) 108 (90 Base) MCG/ACT inhaler Inhale 2 puffs into the lungs every 4 (four) hours as needed for wheezing or shortness of breath. 1 Inhaler 0  . amiodarone (PACERONE) 200 MG tablet Take 400 mg by mouth daily.     Marland Kitchen aspirin EC 81 MG EC tablet Take 1 tablet (81 mg total) by mouth daily. 90 tablet 3  . atorvastatin (LIPITOR) 80 MG tablet Take 1 tablet (80 mg total) by mouth at bedtime.    . Camphor-Eucalyptus-Menthol (CHEST RUB) 4.8-1.2-2.6 % OINT Apply 1 application topically daily as needed (congestion).     . carvedilol (COREG) 3.125 MG tablet Take 1  tablet (3.125 mg total) by mouth 2 (two) times daily with a meal. 60 tablet 0  . clopidogrel (PLAVIX) 75 MG tablet Take 1 tablet (75 mg total) by mouth daily. 30 tablet 5  . colchicine 0.6 MG tablet Take 0.6 mg by mouth daily as needed (gout flare).     . diclofenac sodium (VOLTAREN) 1 % GEL Apply 2 g topically 4 (four) times daily as needed (pain).     . febuxostat (ULORIC) 40 MG tablet Take 1 tablet (40 mg total) by mouth daily. 30 tablet 3  . fluticasone (FLONASE) 50 MCG/ACT nasal spray Place 2 sprays into both nostrils daily. (Patient taking differently: Place 2 sprays into both nostrils daily as needed  for allergies. ) 16 g 2  . Menthol, Topical Analgesic, (ICY HOT EX) Apply 1 application topically daily as needed (pain).    Marland Kitchen mexiletine (MEXITIL) 150 MG capsule Take 1 capsule (150 mg total) by mouth 2 (two) times daily. 180 capsule 3  . Multiple Vitamin (MULTIVITAMIN WITH MINERALS) TABS tablet Take 1 tablet by mouth daily.    . nitroGLYCERIN (NITROSTAT) 0.4 MG SL tablet Place 1 tablet (0.4 mg total) under the tongue every 5 (five) minutes as needed for chest pain. 25 tablet 3  . potassium chloride SA (K-DUR,KLOR-CON) 20 MEQ tablet Take 1 tablet (20 mEq total) by mouth 2 (two) times daily. 60 tablet 0  . sacubitril-valsartan (ENTRESTO) 24-26 MG Take 1 tablet by mouth 2 (two) times daily. 60 tablet 0  . Tiotropium Bromide Monohydrate (SPIRIVA RESPIMAT) 2.5 MCG/ACT AERS Inhale 1 puff into the lungs daily.     Marland Kitchen torsemide (DEMADEX) 20 MG tablet Take 2 tablets (40 mg total) by mouth daily. 60 tablet 0   No current facility-administered medications for this encounter.    Allergies  Allergen Reactions  . Lisinopril Cough   Social History   Socioeconomic History  . Marital status: Divorced    Spouse name: Not on file  . Number of children: Not on file  . Years of education: Not on file  . Highest education level: Not on file  Occupational History  . Not on file  Social Needs  . Financial resource strain: Not on file  . Food insecurity:    Worry: Not on file    Inability: Not on file  . Transportation needs:    Medical: Not on file    Non-medical: Not on file  Tobacco Use  . Smoking status: Former Smoker    Packs/day: 0.33    Years: 33.00    Pack years: 10.89    Types: Cigarettes    Last attempt to quit: 02/20/2016    Years since quitting: 2.4  . Smokeless tobacco: Never Used  Substance and Sexual Activity  . Alcohol use: Yes    Alcohol/week: 0.0 oz    Comment: 06/10/2018 "beer once/month; if that"  . Drug use: Never  . Sexual activity: Yes  Lifestyle  . Physical activity:      Days per week: Not on file    Minutes per session: Not on file  . Stress: Not on file  Relationships  . Social connections:    Talks on phone: Not on file    Gets together: Not on file    Attends religious service: Not on file    Active member of club or organization: Not on file    Attends meetings of clubs or organizations: Not on file    Relationship status: Not on file  .  Intimate partner violence:    Fear of current or ex partner: Not on file    Emotionally abused: Not on file    Physically abused: Not on file    Forced sexual activity: Not on file  Other Topics Concern  . Not on file  Social History Narrative   Independent at baseline, ambulates steadily   Family History  Problem Relation Age of Onset  . Hypertension Mother   . Heart failure Mother   . Hypertension Father   . CAD Father   . Heart attack Father    Office Visit on 07/10/2018  Component Date Value Ref Range Status  . Glucose 07/10/2018 93  65 - 99 mg/dL Final  . BUN 07/10/2018 25* 6 - 24 mg/dL Final  . Creatinine, Ser 07/10/2018 1.85* 0.76 - 1.27 mg/dL Final  . GFR calc non Af Amer 07/10/2018 41* >59 mL/min/1.73 Final  . GFR calc Af Amer 07/10/2018 47* >59 mL/min/1.73 Final  . BUN/Creatinine Ratio 07/10/2018 14  9 - 20 Final  . Sodium 07/10/2018 141  134 - 144 mmol/L Final  . Potassium 07/10/2018 4.0  3.5 - 5.2 mmol/L Final  . Chloride 07/10/2018 96  96 - 106 mmol/L Final  . CO2 07/10/2018 27  20 - 29 mmol/L Final  . Calcium 07/10/2018 9.7  8.7 - 10.2 mg/dL Final   Vitals:   07/23/18 0920  BP: (!) 102/54  Pulse: 86  SpO2: 100%  Weight: 238 lb 6.4 oz (108.1 kg)   Wt Readings from Last 3 Encounters:  07/23/18 238 lb 6.4 oz (108.1 kg)  07/16/18 240 lb 12.8 oz (109.2 kg)  07/10/18 236 lb 3.2 oz (107.1 kg)    PHYSICAL EXAM: General:  Weak appearing. No resp difficulty HEENT: normal Neck: supple. JVP 8-9 Carotids 2+ bilat; no bruits. No lymphadenopathy or thryomegaly appreciated. Cor: PMI  laterally displaced. Regular rate & rhythm. + ectopy +s3 Lungs: clear Abdomen: soft, nontender, nondistended. No hepatosplenomegaly. No bruits or masses. Good bowel sounds. Extremities: no cyanosis, clubbing, rash, edema Neuro: alert & orientedx3, cranial nerves grossly intact. moves all 4 extremities w/o difficulty. Affect pleasant   ASSESSMENT & PLAN:  1. Chronic systolic HFwith cardiogenic shock: Due to NiCM.  - Cath 9/18 with non-obstructive CAD.   - Cardiac MRI 12/18 with EF 16%, severe LV dilation and small area of mid-wall LGE in the mid inferolateral wall.  Possible prior myocarditis as cause of cardiomyopathy with PVCs contributing.  Consider cardiac sarcoidosis with LGE pattern but review (9/18 CTA chest did not show signs of pulmonary sarcoidosis or hilar adenopathy)  - Echo 10/18 EF 25%.Echo 7/19 EF 10-15% RV ok  - He continues to worsen. Now with several episodes VT/VF. NYHA IIIB - Volume status mildly elevated - With arrhythmias and PVC suspect he may have sarcoid but at this point I think it is moot. He is at the point where I think he needs advanced therapies. I spent nearly 1 hour discussing this with him and his wife. He is very reluctant about VAD but would consider transplant. I explained the role of VAD as a bridge - Will increase torsemide to 400 bid for 2 days then back down. Take extra as needed - Continue Coreg 3.125 mg BID. - Continue entresto to 49/51 bid   - Spironolactone stopped during recent hospitalization with volume depletion and AKI.  - Consider PET scan for sarcoid - Schedule CPX - Will have VAD coordinators see to start w/u for advanced therapies ASAP.  2. VF arrest on 12/23/17 at [facility] and again 06/30/18: s/p Medtronic ICD.  - Continue amiodarone - Continue coreg as above.  - No driving.  3. CKD III: - Creatinine 1.8 on 7/11. Will repeat today. Baseline closer to 1.4   4. Frequent PVCs:  - Continues to have frequent PVCs on ECG today despite amio  and PVC ablation  - Will d/w EP.   Total time spent 55 minutes. Over half that time spent discussing above.   Glori Bickers, MD 07/23/18

## 2018-07-25 ENCOUNTER — Telehealth: Payer: Self-pay | Admitting: Internal Medicine

## 2018-07-25 MED ORDER — AMIODARONE HCL 200 MG PO TABS: 400.0000 mg | ORAL_TABLET | Freq: Every day | ORAL | 0 refills | Status: DC

## 2018-07-25 NOTE — Telephone Encounter (Signed)
Spoke with Renea, RPH and advised it should be 400mg  QD.  She said she needed a new prescription.  Advised I would send one over.  Spoke with [name] and he said ok to send prescription over.

## 2018-07-25 NOTE — Telephone Encounter (Signed)
New Message:        Pt c/o medication issue:  1. Name of Medication:amiodarone (PACERONE) 200 MG tablet   2. How are you currently taking this medication (dosage and times per day)?   3. Are you having a reaction (difficulty breathing--STAT)? No  4. What is your medication issue? [facility] is needing clarification on how this medication is supposed to be taken by the pt

## 2018-07-26 ENCOUNTER — Encounter (HOSPITAL_COMMUNITY): Payer: Self-pay | Admitting: Internal Medicine

## 2018-07-28 ENCOUNTER — Ambulatory Visit (HOSPITAL_COMMUNITY): Payer: Commercial Managed Care - PPO | Attending: Internal Medicine

## 2018-07-28 ENCOUNTER — Encounter (HOSPITAL_COMMUNITY): Payer: Self-pay | Admitting: Internal Medicine

## 2018-07-28 ENCOUNTER — Other Ambulatory Visit (HOSPITAL_COMMUNITY): Payer: Self-pay | Admitting: *Deleted

## 2018-07-28 DIAGNOSIS — I5022 Chronic systolic (congestive) heart failure: Secondary | ICD-10-CM | POA: Insufficient documentation

## 2018-07-28 NOTE — Addendum Note (Signed)
Encounter addended by: Jolaine Artist, MD on: 07/28/2018 9:42 AM  Actions taken: Sign clinical note

## 2018-07-28 NOTE — Telephone Encounter (Signed)
Spironolactone was discontinued during hospital admission on 07/06/18. Per Dr.Bensimhons office note on 07/23/18 it says to continue Spironolactone 25mg  even though it was not on the patients medication list or discharge instructions to restart Spironolactone. Pts sister sent mychart message (see below) requesting clarification.   Message routed to [place] for advice.    ----- Message from Stephen [name], Generic sent at 07/26/2018 6:29 PM EDT -----    In the notes for the visit on June 24, it states that Stephen [name] was with his "wife" at the appointment with Dr. Haroldine Laws. His sister was with him. Stephen [name] is not married.    Also, under the Assessment & Plan section, it reads, "Continue Spironolactone 25 daily." However, Spironolactone is not mentioned in the list of medicine. Please advise.    Thanks!

## 2018-07-30 ENCOUNTER — Ambulatory Visit: Admission: RE | Admit: 2018-07-30 | Payer: Commercial Managed Care - PPO | Source: Ambulatory Visit

## 2018-07-30 ENCOUNTER — Telehealth: Payer: Self-pay | Admitting: Gastroenterology

## 2018-07-30 NOTE — Telephone Encounter (Signed)
Stephen [name] U/S  Department called to inform us that pt did not show up for his Ultrasound this morning
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
## 2018-07-31 NOTE — Telephone Encounter (Signed)
Please disregard previous message, ERROR PLEASE DISREGARD

## 2018-07-31 NOTE — Telephone Encounter (Signed)
Pt has been schedule for US paracentesis on Friday 08/08/18 @10 :30 am, pt has been notified

## 2018-08-01 ENCOUNTER — Ambulatory Visit: Payer: Self-pay

## 2018-08-01 NOTE — Telephone Encounter (Signed)
Returned call to pt. To discuss symptoms. Reported he has had a cough for approx. 3 days. C/o coughing "mostly at night."  Reported intermittently coughing up a brown, thick mucus. Denied coughing during the dayitme. Denied sore throat, fever/chills, shortness of breath, or chest pain/ tightness.  Stated he has had "sinus problems" off and on.  Denied any increased pain or pressure in face or forehead.  Reported he does have some nasal congestion. Reported intermittent wheeze during night. Denied swelling in feet or legs, bilaterally.  Denied weight gain. Advised pt. On Home Care treatment. Care advice per protocol.  Verb. Understanding.  Encouraged to check with his Pharmacist re: OTC cough medication that is safe to use with his hx of CHF and  HTN, and and current medications.  Verb. Understanding.  Advised to call back for worsening symptoms.  Agrees with plan.        Reason for Disposition . Cough  Answer Assessment - Initial Assessment Questions 1. ONSET: "When did the cough begin?"      3 days ago; mainly at night  2. SEVERITY: "How bad is the cough today?"     Occasional  3. RESPIRATORY DISTRESS: "Describe your breathing."      Denied  4. FEVER: "Do you have a fever?" If so, ask: "What is your temperature, how was it measured, and when did it start?"     Denied 5. SPUTUM: "Describe the color of your sputum" (clear, white, yellow, green)     Brown, thick mucus 6. HEMOPTYSIS: "Are you coughing up any blood?" If so ask: "How much?" (flecks, streaks, tablespoons, etc.)     Denied 7. CARDIAC HISTORY: "Do you have any history of heart disease?" (e.g., heart attack, congestive heart failure)      Not asked  8. LUNG HISTORY: "Do you have any history of lung disease?"  (e.g., pulmonary embolus, asthma, emphysema)     Denied asthma or COPD 9. PE RISK FACTORS: "Do you have a history of blood clots?" (or: recent major surgery, recent prolonged travel, bedridden)     Not asked  10. OTHER  SYMPTOMS: "Do you have any other symptoms?" (e.g., runny nose, wheezing, chest pain)       Chest congestion, nasal congestion, wheezing during night time; denied chest pain or tightness   12. TRAVEL: "Have you traveled out of the country in the last month?" (e.g., travel history, exposures)       Denied  Protocols used: [place]  Message from Scherrie Gerlach sent at 08/01/2018 2:07 PM EDT   Summary: advice   Pt states he has had cough for about 3 days, Wanted a cough med called in. Advised he would need to be seen. So pt wants to know what he can take over the counter, with his other meds. Pt's dr is out until 8/8, Dr Wynetta Emery

## 2018-08-04 ENCOUNTER — Ambulatory Visit: Payer: Commercial Managed Care - PPO | Admitting: Internal Medicine

## 2018-08-04 ENCOUNTER — Encounter (HOSPITAL_COMMUNITY): Payer: Self-pay

## 2018-08-04 ENCOUNTER — Telehealth (HOSPITAL_COMMUNITY): Payer: Self-pay | Admitting: *Deleted

## 2018-08-04 NOTE — Progress Notes (Signed)
Disability forms and requested documents faxed to provided # [phone] (23 total pages). Copy of forms scanned into electronic medical records under media tab.  Renee Pain, RN

## 2018-08-04 NOTE — Telephone Encounter (Signed)
Pt's sister given CPX results, she states she will be here with him on Fri for his f/u appt as well, she states pt is not interested in LVAD at this time

## 2018-08-08 ENCOUNTER — Encounter (HOSPITAL_COMMUNITY): Payer: Commercial Managed Care - PPO | Admitting: Internal Medicine

## 2018-08-11 NOTE — Progress Notes (Signed)
Patient Care Team: Valerie Roys, DO as PCP - General (Family Medicine) Wellington Hampshire, MD as PCP - Cardiology (Cardiology) [name], FNP as Nurse Practitioner (Family Medicine) Yolonda Kida, MD as Consulting Physician (Cardiology)   HPI  Stephen [name] is a 52 y.o. male Seen with a history of PVCs and a nonischemic cardiomyopathy prior consideration for an ICD.  He also has a history of a TIA and remote syncope.  Initial evaluation 12/18 ventricular ectopy was noted with the plan to use amiodarone for suppression of the ectopy and then reassessment of LV function  Unfortunately he then had a cardiac arrest from which he recovered; underwent ICD implantation 12/18  Amio.mexiletene failed to suppress PVC>> JA ablation 6/19. Poor result and referred to [facility]    Intercurrently, syncope with VT/VF Mex resumed with amio  CHF clinic eval >>CPX "moderate to severe HF limitation"  Declined LVAD workup  He is currently on amio and Mex for PVC suppression   , DATE TEST EF   10/18 Echo   25 % Mod LAE MR  12/18  cMRI  16 % Hypokinetic RV midwall gadolinium  7/19 Echo 10-15 %    Date Cr K TSH LFTs  4/19 1.38 3.9 1.73 (2/19) 13(2/19)          He continues with SB  saw Dr DB last month and the question raised again re ablation of PVCs;  Declined discussion with the LVAD.  He would prefer to go to [facility] for ablation.  I reached out spoke with Dr. Omelia Blackwater and is given me Dr. Zettie Cooley name Tolerating AMIO Continue current medications    Records and Results Reviewed  Hospital records  Past Medical History:  Diagnosis Date  . AICD (automatic cardioverter/defibrillator) present   . AKI (acute kidney injury) ([place]) 12/24/2017  . Arrhythmia   . Arthritis    "lower back, knees" (06/10/2018)  . Cardiac arrest ([place]) 12/23/2017   Brief V-fib arrest  . Chest pain 09/08/2017  . CHF (congestive heart failure) ([facility])    nonischemic cardiomyopathy, EF 25%  .  Gout    "on daily RX" (06/10/2018)  . Headache    "q couple months" (06/10/2018)  . High cholesterol   . History of kidney stones   . Hypertension   . Influenza A 12/24/2017  . NICM (nonischemic cardiomyopathy) ([place])   . Pneumonia 12/20/2017  . TIA (transient ischemic attack) 06/21/2016   "still affects my memory a little bit" (06/10/2018)    Past Surgical History:  Procedure Laterality Date  . EXTERNAL FIXATION LEG Right ~ 2000   "was going bowlegged; had to brake my leg to fix it"  . HERNIA REPAIR    . ICD IMPLANT N/A 12/30/2017   Procedure: ICD IMPLANT;  Surgeon: Deboraha Sprang, MD;  Location: [facility];  Service: Cardiovascular;  Laterality: N/A;  . LEFT HEART CATH AND CORONARY ANGIOGRAPHY N/A 09/09/2017   Procedure: LEFT HEART CATH AND CORONARY ANGIOGRAPHY;  Surgeon: Teodoro Spray, MD;  Location: [facility];  Service: Cardiovascular;  Laterality: N/A;  . UMBILICAL HERNIA REPAIR  1990s  . V TACH ABLATION  06/10/2018  . V TACH ABLATION N/A 06/10/2018   Procedure: V TACH ABLATION;  Surgeon: Thompson Grayer, MD;  Location: [facility];  Service: Cardiovascular;  Laterality: N/A;  . VASECTOMY      Current Outpatient Medications  Medication Sig Dispense Refill  . acetaminophen (  TYLENOL) 325 MG tablet Take 2 tablets (650 mg total) by mouth every 6 (six) hours as needed. (Patient taking differently: Take 325-650 mg by mouth every 6 (six) hours as needed for moderate pain. ) 60 tablet 0  . albuterol (PROVENTIL HFA;VENTOLIN HFA) 108 (90 Base) MCG/ACT inhaler Inhale 2 puffs into the lungs every 4 (four) hours as needed for wheezing or shortness of breath. 1 Inhaler 0  . amiodarone (PACERONE) 200 MG tablet Take 2 tablets (400 mg total) by mouth daily. 180 tablet 0  . aspirin EC 81 MG EC tablet Take 1 tablet (81 mg total) by mouth daily. 90 tablet 3  . atorvastatin (LIPITOR) 80 MG tablet Take 1 tablet (80 mg total) by mouth at bedtime.    .  Camphor-Eucalyptus-Menthol (CHEST RUB) 4.8-1.2-2.6 % OINT Apply 1 application topically daily as needed (congestion).     . carvedilol (COREG) 3.125 MG tablet Take 1 tablet (3.125 mg total) by mouth 2 (two) times daily with a meal. 60 tablet 0  . clopidogrel (PLAVIX) 75 MG tablet Take 1 tablet (75 mg total) by mouth daily. 30 tablet 5  . colchicine 0.6 MG tablet Take 0.6 mg by mouth daily as needed (gout flare).     . diclofenac sodium (VOLTAREN) 1 % GEL Apply 2 g topically 4 (four) times daily as needed (pain).     . febuxostat (ULORIC) 40 MG tablet Take 1 tablet (40 mg total) by mouth daily. 30 tablet 3  . fluticasone (FLONASE) 50 MCG/ACT nasal spray Place 2 sprays into both nostrils daily. (Patient taking differently: Place 2 sprays into both nostrils daily as needed for allergies. ) 16 g 2  . fluticasone-salmeterol (ADVAIR HFA) 115-21 MCG/ACT inhaler Inhale 2 puffs into the lungs 2 (two) times daily. 1 Inhaler 12  . guaiFENesin (MUCINEX) 600 MG 12 hr tablet Take 1 tablet (600 mg total) by mouth 2 (two) times daily as needed. 60 tablet 2  . Menthol, Topical Analgesic, (ICY HOT EX) Apply 1 application topically daily as needed (pain).    Marland Kitchen mexiletine (MEXITIL) 150 MG capsule Take 1 capsule (150 mg total) by mouth 2 (two) times daily. 180 capsule 3  . Multiple Vitamin (MULTIVITAMIN WITH MINERALS) TABS tablet Take 1 tablet by mouth daily.    . nitroGLYCERIN (NITROSTAT) 0.4 MG SL tablet Place 1 tablet (0.4 mg total) under the tongue every 5 (five) minutes as needed for chest pain. 25 tablet 3  . potassium chloride SA (K-DUR,KLOR-CON) 20 MEQ tablet Take 1 tablet (20 mEq total) by mouth 2 (two) times daily. 60 tablet 0  . sacubitril-valsartan (ENTRESTO) 24-26 MG Take 1 tablet by mouth 2 (two) times daily. 60 tablet 0  . Tiotropium Bromide Monohydrate (SPIRIVA RESPIMAT) 2.5 MCG/ACT AERS Inhale 1 puff into the lungs daily. 1 Inhaler 12  . torsemide (DEMADEX) 20 MG tablet Take 2 tablets (40 mg total) by  mouth daily. 60 tablet 0   No current facility-administered medications for this visit.     Allergies  Allergen Reactions  . Lisinopril Cough      Review of Systems negative except from HPI and PMH  Physical Exam BP (!) 98/58 (BP Location: Left Arm, Patient Position: Sitting, Cuff Size: Normal)   Pulse 77   Ht 6\' 1"  (1.854 m)   Wt 239 lb 8 oz (108.6 kg)   BMI 31.60 kg/m  Well developed and nourished in no acute distress HENT normal Neck supple with JVP-flat Clear Regular rate and rhythm, no murmurs or  gallops Abd-soft with active BS No Clubbing cyanosis edema Skin-warm and dry A & Oriented  Grossly normal sensory and motor function   ECG sinus at 77 frequent PVCs left bundle branch block Assessment and  Plan   Cardiomyopathy-nonischemic  Congestive heart failure-class IIb-IIIa  Sleep disordered breathing and daytime somnolence question sleep apnea  PVCs/nonsustained ventricular tachycardia-very frequent ,As above  Amazing that his functional status remains quite stable.  Recurrent ventricular tachycardia.  He is interested in going to [facility] for epicardial ablation. As above   Also suggested that he asked for consultation with her heart failure program to reconsider the issue LVAD therapy.  He is open to this.  We spent more than 50% of our >25 min visit in face to face counseling regarding the above

## 2018-08-12 ENCOUNTER — Ambulatory Visit (INDEPENDENT_AMBULATORY_CARE_PROVIDER_SITE_OTHER): Payer: Commercial Managed Care - PPO | Admitting: Internal Medicine

## 2018-08-12 ENCOUNTER — Encounter: Payer: Self-pay | Admitting: Internal Medicine

## 2018-08-12 ENCOUNTER — Encounter

## 2018-08-12 ENCOUNTER — Ambulatory Visit: Payer: Commercial Managed Care - PPO | Admitting: Cardiovascular Disease

## 2018-08-12 VITALS — BP 124/84 | HR 77 | Ht 73.0 in | Wt 240.0 lb

## 2018-08-12 VITALS — BP 98/58 | HR 77 | Ht 73.0 in | Wt 239.5 lb

## 2018-08-12 DIAGNOSIS — I472 Ventricular tachycardia, unspecified: Secondary | ICD-10-CM

## 2018-08-12 DIAGNOSIS — J449 Chronic obstructive pulmonary disease, unspecified: Secondary | ICD-10-CM

## 2018-08-12 DIAGNOSIS — I5022 Chronic systolic (congestive) heart failure: Secondary | ICD-10-CM | POA: Diagnosis not present

## 2018-08-12 DIAGNOSIS — I493 Ventricular premature depolarization: Secondary | ICD-10-CM

## 2018-08-12 DIAGNOSIS — Z95810 Presence of automatic (implantable) cardiac defibrillator: Secondary | ICD-10-CM | POA: Diagnosis not present

## 2018-08-12 DIAGNOSIS — I428 Other cardiomyopathies: Secondary | ICD-10-CM | POA: Diagnosis not present

## 2018-08-12 DIAGNOSIS — Z79899 Other long term (current) drug therapy: Secondary | ICD-10-CM

## 2018-08-12 MED ORDER — ALBUTEROL SULFATE HFA 108 (90 BASE) MCG/ACT IN AERS: 2.0000 | INHALATION_SPRAY | RESPIRATORY_TRACT | 0 refills | Status: DC | PRN

## 2018-08-12 MED ORDER — GUAIFENESIN ER 600 MG PO TB12: 600.0000 mg | ORAL_TABLET | Freq: Two times a day (BID) | ORAL | 2 refills | Status: DC | PRN

## 2018-08-12 MED ORDER — TIOTROPIUM BROMIDE MONOHYDRATE 2.5 MCG/ACT IN AERS: 1.0000 | INHALATION_SPRAY | Freq: Every day | RESPIRATORY_TRACT | 12 refills | Status: DC

## 2018-08-12 MED ORDER — FLUTICASONE-SALMETEROL 115-21 MCG/ACT IN AERO: 2.0000 | INHALATION_SPRAY | Freq: Two times a day (BID) | RESPIRATORY_TRACT | 12 refills | Status: DC

## 2018-08-12 NOTE — Patient Instructions (Addendum)
Continue Inhalers as prescribed Mucinex as needed for chest congestion

## 2018-08-12 NOTE — Progress Notes (Signed)
Name: Stephen [name] MRN: [id number] DOB: [birth year]     CONSULTATION DATE: 07/24/17 REFERRING MD :   Posey Pronto  CHIEF COMPLAINT:  copd  STUDIES:   chest x-ray 06/18/2017 reviewed on 07/24/17 I have Independently reviewed images Interpretation: bilateral interstitial infiltrates   HISTORY OF PRESENT ILLNESS:  52 y.o.male. He has a history of chronic systolic CHF with latest EF 20-25%.    patient noted to have been diagnosed with Copd and was asked to follow-up with pulmonary as outpatient   patient has chronic cough and intermittent wheezing Patient states that the pro-air helps with his wheezing Patient on advair and spiriva and these inhalers help    patient is a former smoker one pack per week for the last 50 years  patient is exposed to smoke from his cooker   patient has no signs of acute COPD exacerbation at this time Patient does not have any signs of infection at this time Patient does not have any signs of acute CHF at this time  Patient DID not obtain PFT's Will office spiro at next [place]:   Constitutional: Negative for fever, chills, weight loss, malaise/fatigue and diaphoresis.  HENT: Negative for hearing loss, ear pain, nosebleeds, congestion, sore throat, neck pain, tinnitus and ear discharge.   Eyes: Negative for blurred vision, double vision, photophobia, pain, discharge and redness.  Respiratory: +cough, hemoptysis, sputum production,  +shortness of breath, +wheezing and stridor.   Cardiovascular: Negative for chest pain, palpitations, orthopnea, claudication, leg swelling and PND.  ALL OTHER ROS ARE NEGATIVE   BP 124/84 (BP Location: Left Arm, Cuff Size: Normal)   Pulse 77   Ht 6\' 1"  (1.854 m)   Wt 240 lb (108.9 kg)   SpO2 98%   BMI 31.66 kg/m   Physical Examination:   GENERAL:NAD, no fevers, chills, no weakness no fatigue HEAD: Normocephalic, atraumatic.  EYES: Pupils equal, round, reactive to light. Extraocular muscles  intact. No scleral icterus.  MOUTH: Moist mucosal membrane.   EAR, NOSE, THROAT: Clear without exudates. No external lesions.  NECK: Supple. No thyromegaly. No nodules. No JVD.  PULMONARY:CTA B/L no wheezes, no crackles, no rhonchi CARDIOVASCULAR: S1 and S2. Regular rate and rhythm. No murmurs, rubs, or gallops. No edema.  GASTROINTESTINAL: Soft, nontender, nondistended. No masses. Positive bowel sounds.  MUSCULOSKELETAL: No swelling, clubbing, or edema. Range of motion full in all extremities.  NEUROLOGIC: Cranial nerves II through XII are intact. No gross focal neurological deficits.  SKIN: No ulceration, lesions, rashes, or cyanosis. Skin warm and dry. Turgor intact.  PSYCHIATRIC: Mood, affect within normal limits. The patient is awake, alert and oriented x 3. Insight, judgment intact.      ASSESSMENT / PLAN: 52 year old pleasant African-American male seen today for follow up assessment for COPD in the setting of   congestive heart failure and deconditioned state   #1COPD Plan for office spiro at next [place] Patient advised to continue Advair Advised to continue spiriva Albuterol as Needed   I have also advised patient to use mask any time he uses his cooker  his COPD is stable at this time  Mucinex as needed for cough and chest congestion No signs of COPD exacerbation at this time No need for systemic steroids or ABX   #2 CHF with the  ejection fraction of 20%  follow-up with Dr. Jerrye Beavers  his CHF seems to be stable at this time   #3Deconditioned state -Recommend increased daily activity and exercise  Patient satisfied with Plan of action and management. All questions answered Follow-up in 6 months   Helga Asbury Patricia Pesa, M.D.  [facility]  Medical Director [place] Director [facility] Cardio-Pulmonary Department

## 2018-08-12 NOTE — Patient Instructions (Signed)
Medication Instructions: - Your physician recommends that you continue on your current medications as directed. Please refer to the Current Medication list given to you today.  Labwork: - none ordered  Procedures/Testing: - pending your evaluation at [facility]  Follow-Up: - Your physician recommends that you schedule a follow-up appointment in: 3 months with [name].   Any Additional Special Instructions Will Be Listed Below (If Applicable).     If you need a refill on your cardiac medications before your next appointment, please call your pharmacy.

## 2018-08-14 ENCOUNTER — Encounter: Payer: Commercial Managed Care - PPO | Admitting: Internal Medicine

## 2018-08-14 NOTE — Addendum Note (Signed)
Addended by: Janan Ridge on: 08/14/2018 11:49 AM   Modules accepted: Orders

## 2018-08-18 ENCOUNTER — Telehealth (HOSPITAL_COMMUNITY): Payer: Self-pay | Admitting: *Deleted

## 2018-08-18 NOTE — Telephone Encounter (Signed)
Received fax fr om [facility], pt needs clearance for colonoscopy w/Dr Leda Quail.  Per Dr Haroldine Laws:  "pt is too high of a surgical risk for cardiac clearance to be granted due to severe HF"  Note faxed to them at [phone]

## 2018-08-19 ENCOUNTER — Emergency Department: Payer: Commercial Managed Care - PPO

## 2018-08-19 ENCOUNTER — Telehealth: Payer: Self-pay

## 2018-08-19 ENCOUNTER — Encounter: Payer: Self-pay | Admitting: Emergency Medicine

## 2018-08-19 ENCOUNTER — Observation Stay
Admission: EM | Admit: 2018-08-19 | Discharge: 2018-08-20 | Disposition: A | Discharge status: Home / Self Care | Payer: Commercial Managed Care - PPO | Attending: Internal Medicine | Admitting: Internal Medicine

## 2018-08-19 ENCOUNTER — Other Ambulatory Visit: Payer: Self-pay

## 2018-08-19 DIAGNOSIS — Z4502 Encounter for adjustment and management of automatic implantable cardiac defibrillator: Secondary | ICD-10-CM | POA: Diagnosis present

## 2018-08-19 DIAGNOSIS — Z7982 Long term (current) use of aspirin: Secondary | ICD-10-CM | POA: Insufficient documentation

## 2018-08-19 DIAGNOSIS — Z8674 Personal history of sudden cardiac arrest: Secondary | ICD-10-CM | POA: Diagnosis not present

## 2018-08-19 DIAGNOSIS — J449 Chronic obstructive pulmonary disease, unspecified: Secondary | ICD-10-CM | POA: Diagnosis not present

## 2018-08-19 DIAGNOSIS — M109 Gout, unspecified: Secondary | ICD-10-CM | POA: Diagnosis not present

## 2018-08-19 DIAGNOSIS — Z7951 Long term (current) use of inhaled steroids: Secondary | ICD-10-CM | POA: Insufficient documentation

## 2018-08-19 DIAGNOSIS — I130 Hypertensive heart and chronic kidney disease with heart failure and stage 1 through stage 4 chronic kidney disease, or unspecified chronic kidney disease: Secondary | ICD-10-CM | POA: Insufficient documentation

## 2018-08-19 DIAGNOSIS — I429 Cardiomyopathy, unspecified: Secondary | ICD-10-CM | POA: Diagnosis not present

## 2018-08-19 DIAGNOSIS — N179 Acute kidney failure, unspecified: Secondary | ICD-10-CM | POA: Insufficient documentation

## 2018-08-19 DIAGNOSIS — I472 Ventricular tachycardia: Secondary | ICD-10-CM | POA: Diagnosis not present

## 2018-08-19 DIAGNOSIS — I252 Old myocardial infarction: Secondary | ICD-10-CM | POA: Diagnosis not present

## 2018-08-19 DIAGNOSIS — T503X6A Underdosing of electrolytic, caloric and water-balance agents, initial encounter: Secondary | ICD-10-CM | POA: Diagnosis not present

## 2018-08-19 DIAGNOSIS — Z91128 Patient's intentional underdosing of medication regimen for other reason: Secondary | ICD-10-CM | POA: Insufficient documentation

## 2018-08-19 DIAGNOSIS — Z8249 Family history of ischemic heart disease and other diseases of the circulatory system: Secondary | ICD-10-CM | POA: Diagnosis not present

## 2018-08-19 DIAGNOSIS — Z95810 Presence of automatic (implantable) cardiac defibrillator: Secondary | ICD-10-CM | POA: Insufficient documentation

## 2018-08-19 DIAGNOSIS — E876 Hypokalemia: Principal | ICD-10-CM | POA: Diagnosis present

## 2018-08-19 DIAGNOSIS — Z7902 Long term (current) use of antithrombotics/antiplatelets: Secondary | ICD-10-CM | POA: Diagnosis not present

## 2018-08-19 DIAGNOSIS — N183 Chronic kidney disease, stage 3 (moderate): Secondary | ICD-10-CM | POA: Insufficient documentation

## 2018-08-19 DIAGNOSIS — M79661 Pain in right lower leg: Secondary | ICD-10-CM | POA: Diagnosis not present

## 2018-08-19 DIAGNOSIS — Z87891 Personal history of nicotine dependence: Secondary | ICD-10-CM | POA: Diagnosis not present

## 2018-08-19 DIAGNOSIS — Z79899 Other long term (current) drug therapy: Secondary | ICD-10-CM | POA: Diagnosis not present

## 2018-08-19 DIAGNOSIS — Z8673 Personal history of transient ischemic attack (TIA), and cerebral infarction without residual deficits: Secondary | ICD-10-CM | POA: Insufficient documentation

## 2018-08-19 LAB — BASIC METABOLIC PANEL
Anion gap: 14 (ref 5–15)
BUN: 63 mg/dL — ABNORMAL HIGH (ref 6–20)
CALCIUM: 9.8 mg/dL (ref 8.9–10.3)
CHLORIDE: 93 mmol/L — AB (ref 98–111)
CO2: 30 mmol/L (ref 22–32)
CREATININE: 2.03 mg/dL — AB (ref 0.61–1.24)
GFR calc non Af Amer: 36 mL/min — ABNORMAL LOW (ref >60)
GFR, EST AFRICAN AMERICAN: 41 mL/min — AB (ref >60)
Glucose, Bld: 140 mg/dL — ABNORMAL HIGH (ref 70–99)
Potassium: 2.5 mmol/L — CL (ref 3.5–5.1)
SODIUM: 137 mmol/L (ref 135–145)

## 2018-08-19 LAB — CBC
HCT: 46.8 % (ref 40.0–52.0)
Hemoglobin: 15.8 g/dL (ref 13.0–18.0)
MCH: 29.7 pg (ref 26.0–34.0)
MCHC: 33.7 g/dL (ref 32.0–36.0)
MCV: 88 fL (ref 80.0–100.0)
PLATELETS: 234 10*3/uL (ref 150–440)
RBC: 5.32 MIL/uL (ref 4.40–5.90)
RDW: 15.7 % — ABNORMAL HIGH (ref 11.5–14.5)
WBC: 11.3 10*3/uL — ABNORMAL HIGH (ref 3.8–10.6)

## 2018-08-19 LAB — TROPONIN I: Troponin I: 0.14 ng/mL (ref <0.03)

## 2018-08-19 LAB — POTASSIUM: Potassium: 2.8 mmol/L — ABNORMAL LOW (ref 3.5–5.1)

## 2018-08-19 LAB — MAGNESIUM: Magnesium: 2.3 mg/dL (ref 1.7–2.4)

## 2018-08-19 MED ORDER — CARVEDILOL 6.25 MG PO TABS
3.1250 mg | ORAL_TABLET | Freq: Two times a day (BID) | ORAL | Status: DC
  Administered 2018-08-20: 3.125 mg via ORAL
  Filled 2018-08-19 (×2): qty 1

## 2018-08-19 MED ORDER — SODIUM CHLORIDE 0.9 % IV SOLN: INTRAVENOUS | Status: DC

## 2018-08-19 MED ORDER — AMIODARONE HCL 200 MG PO TABS
400.0000 mg | ORAL_TABLET | Freq: Every day | ORAL | Status: DC
  Administered 2018-08-20: 400 mg via ORAL
  Filled 2018-08-19: qty 2

## 2018-08-19 MED ORDER — ATORVASTATIN CALCIUM 20 MG PO TABS
80.0000 mg | ORAL_TABLET | Freq: Every day | ORAL | Status: DC
  Administered 2018-08-20: 80 mg via ORAL
  Filled 2018-08-19: qty 4

## 2018-08-19 MED ORDER — BISACODYL 5 MG PO TBEC: 5.0000 mg | DELAYED_RELEASE_TABLET | Freq: Every day | ORAL | Status: DC | PRN

## 2018-08-19 MED ORDER — HEPARIN SODIUM (PORCINE) 5000 UNIT/ML IJ SOLN
5000.0000 [IU] | Freq: Three times a day (TID) | INTRAMUSCULAR | Status: DC
  Administered 2018-08-20 (×2): 5000 [IU] via SUBCUTANEOUS
  Filled 2018-08-19 (×2): qty 1

## 2018-08-19 MED ORDER — POTASSIUM CHLORIDE CRYS ER 20 MEQ PO TBCR
40.0000 meq | EXTENDED_RELEASE_TABLET | Freq: Once | ORAL | Status: AC
  Administered 2018-08-19: 40 meq via ORAL
  Filled 2018-08-19: qty 2

## 2018-08-19 MED ORDER — DOCUSATE SODIUM 100 MG PO CAPS
100.0000 mg | ORAL_CAPSULE | Freq: Two times a day (BID) | ORAL | Status: DC
  Filled 2018-08-19: qty 1

## 2018-08-19 MED ORDER — ONDANSETRON HCL 4 MG/2ML IJ SOLN: 4.0000 mg | Freq: Four times a day (QID) | INTRAMUSCULAR | Status: DC | PRN

## 2018-08-19 MED ORDER — ASPIRIN EC 81 MG PO TBEC
81.0000 mg | DELAYED_RELEASE_TABLET | Freq: Every day | ORAL | Status: DC
  Administered 2018-08-20: 81 mg via ORAL
  Filled 2018-08-19: qty 1

## 2018-08-19 MED ORDER — SODIUM CHLORIDE 0.9% IV SOLUTION
Freq: Once | INTRAVENOUS | Status: DC
  Filled 2018-08-19: qty 250

## 2018-08-19 MED ORDER — HYDROCODONE-ACETAMINOPHEN 5-325 MG PO TABS: 1.0000 | ORAL_TABLET | ORAL | Status: DC | PRN

## 2018-08-19 MED ORDER — SODIUM CHLORIDE 0.9 % IV BOLUS
1000.0000 mL | Freq: Once | INTRAVENOUS | Status: AC
  Administered 2018-08-19: 1000 mL via INTRAVENOUS

## 2018-08-19 MED ORDER — ONDANSETRON HCL 4 MG PO TABS: 4.0000 mg | ORAL_TABLET | Freq: Four times a day (QID) | ORAL | Status: DC | PRN

## 2018-08-19 MED ORDER — ACETAMINOPHEN 325 MG PO TABS
650.0000 mg | ORAL_TABLET | Freq: Four times a day (QID) | ORAL | Status: DC | PRN
  Administered 2018-08-20: 650 mg via ORAL
  Filled 2018-08-19: qty 2

## 2018-08-19 MED ORDER — ACETAMINOPHEN 650 MG RE SUPP: 650.0000 mg | Freq: Four times a day (QID) | RECTAL | Status: DC | PRN

## 2018-08-19 MED ORDER — FEBUXOSTAT 40 MG PO TABS
40.0000 mg | ORAL_TABLET | Freq: Every day | ORAL | Status: DC
  Administered 2018-08-20: 40 mg via ORAL
  Filled 2018-08-19: qty 1

## 2018-08-19 MED ORDER — POTASSIUM CHLORIDE 10 MEQ/100ML IV SOLN
10.0000 meq | INTRAVENOUS | Status: AC
  Administered 2018-08-19 – 2018-08-20 (×4): 10 meq via INTRAVENOUS
  Filled 2018-08-19 (×4): qty 100

## 2018-08-19 NOTE — ED Notes (Signed)
Tomi Bamberger from [facility] is currently in route

## 2018-08-19 NOTE — Telephone Encounter (Signed)
Spoke with pt regarding shock x 2 this morning pt stated that he didn't feel the shock only the pacing, pt stated that he had been compliant with Amiodarone and Mexiletine. Informed pt of driving restrictions x 6 months and informed pt that I would review with [name] this afternoon, pt voiced understanding.

## 2018-08-19 NOTE — ED Provider Notes (Signed)
[facility] Emergency Department Provider Note  ____________________________________________   I have reviewed the triage vital signs and the nursing notes.   HISTORY  Chief Complaint Defibrillator shock  History limited by: Not Limited   HPI Stephen [name] is a 52 y.o. male who presents to the emergency department today because of concerns for defibrillator shock.  Patient states that he had 2 episodes last night.  He denies any significant chest pain with but states he felt it in his stomach.  He also had some associated shortness of breath.  States he was shocked once about 2 weeks ago.  Patient denies any recent illness.  States he was in his normal state of health yesterday.  On review of systems however the patient states he has had some right lower leg swelling and discomfort at his calf.   Per medical record review patient has a history of AICD, AKI  Past Medical History:  Diagnosis Date  . AICD (automatic cardioverter/defibrillator) present   . AKI (acute kidney injury) ([place]) 12/24/2017  . Arrhythmia   . Arthritis    "lower back, knees" (06/10/2018)  . Cardiac arrest ([place]) 12/23/2017   Brief V-fib arrest  . Chest pain 09/08/2017  . CHF (congestive heart failure) (HCC)    nonischemic cardiomyopathy, EF 25%  . Gout    "on daily RX" (06/10/2018)  . Headache    "q couple months" (06/10/2018)  . High cholesterol   . History of kidney stones   . Hypertension   . Influenza A 12/24/2017  . NICM (nonischemic cardiomyopathy) ([place])   . Pneumonia 12/20/2017  . TIA (transient ischemic attack) 06/21/2016   "still affects my memory a little bit" (06/10/2018)    Patient Active Problem List   Diagnosis Date Noted  . Syncope 06/30/2018  . CKD (chronic kidney disease) stage 3, GFR 30-59 ml/min (HCC) 05/13/2018  . Hyperlipidemia 05/13/2018  . ED (erectile dysfunction) 05/13/2018  . Osteoarthritis of right knee 04/30/2018  . Gout 03/26/2018  . Acute pain  of right wrist 03/26/2018  . Swelling of ankle joint 03/26/2018  . VF (ventricular fibrillation) ([place])   . Nonischemic cardiomyopathy ([place])   . Cardiogenic shock ([place]) 12/24/2017  . 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 History of non-ST elevation myocardial infarction (NSTEMI) 09/08/2017  . Bradycardia 08/30/2017  . Ventricular tachycardia ([place]) 07/29/2017  . COPD (chronic obstructive pulmonary disease) ([place]) 07/04/2017  . TIA (transient ischemic attack) 06/22/2016  . Benign hypertensive renal disease 06/22/2016  . Acute on chronic systolic CHF (congestive heart failure) ([place]) 06/04/2016  . Chronic systolic congestive heart failure ([place]) 01/20/2016  . Cardiomyopathy ([place]) 01/20/2016    Past Surgical History:  Procedure Laterality Date  . EXTERNAL FIXATION LEG Right ~ 2000   "was going bowlegged; had to brake my leg to fix it"  . HERNIA REPAIR    . ICD IMPLANT N/A 12/30/2017   Procedure: ICD IMPLANT;  Surgeon: Deboraha Sprang, MD;  Location: [facility];  Service: Cardiovascular;  Laterality: N/A;  . LEFT HEART CATH AND CORONARY ANGIOGRAPHY N/A 09/09/2017   Procedure: LEFT HEART CATH AND CORONARY ANGIOGRAPHY;  Surgeon: Teodoro Spray, MD;  Location: [facility];  Service: Cardiovascular;  Laterality: N/A;  . UMBILICAL HERNIA REPAIR  1990s  . V TACH ABLATION  06/10/2018  . V TACH ABLATION N/A 06/10/2018   Procedure: V TACH ABLATION;  Surgeon: Thompson Grayer, MD;  Location: [facility];  Service: Cardiovascular;  Laterality: N/A;  .  VASECTOMY      Prior to Admission medications   Medication Sig Start Date End Date Taking? Authorizing Provider  acetaminophen (TYLENOL) 325 MG tablet Take 2 tablets (650 mg total) by mouth every 6 (six) hours as needed. Patient taking differently: Take 325-650 mg by mouth every 6 (six) hours as needed for moderate pain.  03/19/18   Carrie Mew, MD  albuterol (PROVENTIL HFA;VENTOLIN HFA) 108 (90 Base) MCG/ACT inhaler Inhale 2 puffs into  the lungs every 4 (four) hours as needed for wheezing or shortness of breath. 08/12/18   [name], MD  amiodarone (PACERONE) 200 MG tablet Take 2 tablets (400 mg total) by mouth daily. 07/25/18   Deboraha Sprang, MD  aspirin EC 81 MG EC tablet Take 1 tablet (81 mg total) by mouth daily. 01/01/18   Daune Perch, NP  atorvastatin (LIPITOR) 80 MG tablet Take 1 tablet (80 mg total) by mouth at bedtime. 07/06/18   Loletha Grayer, MD  Camphor-Eucalyptus-Menthol (CHEST RUB) 4.8-1.2-2.6 % OINT Apply 1 application topically daily as needed (congestion).     [provider]  carvedilol (COREG) 3.125 MG tablet Take 1 tablet (3.125 mg total) by mouth 2 (two) times daily with a meal. 07/06/18   Loletha Grayer, MD  clopidogrel (PLAVIX) 75 MG tablet Take 1 tablet (75 mg total) by mouth daily. 01/30/18   Bensimhon, Shaune Pascal, MD  colchicine 0.6 MG tablet Take 0.6 mg by mouth daily as needed (gout flare).     [provider]  diclofenac sodium (VOLTAREN) 1 % GEL Apply 2 g topically 4 (four) times daily as needed (pain).     [provider]  febuxostat (ULORIC) 40 MG tablet Take 1 tablet (40 mg total) by mouth daily. 05/19/18   Johnson, Megan P, DO  fluticasone (FLONASE) 50 MCG/ACT nasal spray Place 2 sprays into both nostrils daily. Patient taking differently: Place 2 sprays into both nostrils daily as needed for allergies.  06/20/17   [name], MD  fluticasone-salmeterol (ADVAIR HFA) [phone] MCG/ACT inhaler Inhale 2 puffs into the lungs 2 (two) times daily. 08/12/18   [name], MD  guaiFENesin (MUCINEX) 600 MG 12 hr tablet Take 1 tablet (600 mg total) by mouth 2 (two) times daily as needed. 08/12/18 08/12/19  [name], MD  Menthol, Topical Analgesic, (ICY HOT EX) Apply 1 application topically daily as needed (pain).    [provider]  mexiletine (MEXITIL) 150 MG capsule Take 1 capsule (150 mg total) by mouth 2 (two) times daily. 07/09/18   Allred, Jeneen Rinks, MD  Multiple  Vitamin (MULTIVITAMIN WITH MINERALS) TABS tablet Take 1 tablet by mouth daily.    [provider]  nitroGLYCERIN (NITROSTAT) 0.4 MG SL tablet Place 1 tablet (0.4 mg total) under the tongue every 5 (five) minutes as needed for chest pain. 02/19/18   Bensimhon, Shaune Pascal, MD  potassium chloride SA (K-DUR,KLOR-CON) 20 MEQ tablet Take 1 tablet (20 mEq total) by mouth 2 (two) times daily. 07/06/18   Loletha Grayer, MD  sacubitril-valsartan (ENTRESTO) 24-26 MG Take 1 tablet by mouth 2 (two) times daily. 07/06/18   Loletha Grayer, MD  Tiotropium Bromide Monohydrate (SPIRIVA RESPIMAT) 2.5 MCG/ACT AERS Inhale 1 puff into the lungs daily. 08/12/18   [name], MD  torsemide (DEMADEX) 20 MG tablet Take 2 tablets (40 mg total) by mouth daily. 07/06/18   Loletha Grayer, MD    Allergies Lisinopril  Family History  Problem Relation Age of Onset  . Hypertension Mother   .  Heart failure Mother   . Hypertension Father   . CAD Father   . Heart attack Father     Social History Social History   Tobacco Use  . Smoking status: Former Smoker    Packs/day: 0.33    Years: 33.00    Pack years: 10.89    Types: Cigarettes    Last attempt to quit: 02/20/2016    Years since quitting: 2.4  . Smokeless tobacco: Never Used  Substance Use Topics  . Alcohol use: Yes    Alcohol/week: 0.0 standard drinks    Comment: 06/10/2018 "beer once/month; if that"  . Drug use: Never    Review of Systems Constitutional: No fever/chills Eyes: No visual changes. ENT: No sore throat. Cardiovascular: Denies chest pain. Respiratory: Positive for shortness of breath. Gastrointestinal: Positive for upper abdominal pain. Genitourinary: Negative for dysuria. Musculoskeletal: Positive for right lower leg swelling, pain. Skin: Negative for rash. Neurological: Negative for headaches, focal weakness or numbness.  ____________________________________________   PHYSICAL EXAM:  VITAL SIGNS: ED Triage Vitals [08/19/18  1438]  Enc Vitals Group     BP 127/90     Pulse Rate 71     Resp      Temp 98.6 F (37 C)     Temp Source Oral     SpO2 100 %     Weight 238 lb (108 kg)     Height 6\' 1"  (1.854 m)     Head Circumference      Peak Flow      Pain Score 0   Constitutional: Alert and oriented.  Eyes: Conjunctivae are normal.  ENT      Head: Normocephalic and atraumatic.      Nose: No congestion/rhinnorhea.      Mouth/Throat: Mucous membranes are moist.      Neck: No stridor. Hematological/Lymphatic/Immunilogical: No cervical lymphadenopathy. Cardiovascular: Normal rate, regular rhythm.  No murmurs, rubs, or gallops. Respiratory: Normal respiratory effort without tachypnea nor retractions. Breath sounds are clear and equal bilaterally. No wheezes/rales/rhonchi. Gastrointestinal: Soft and non tender. No rebound. No guarding.  Genitourinary: Deferred Musculoskeletal: Normal range of motion in all extremities. No lower extremity edema. Neurologic:  Normal speech and language. No gross focal neurologic deficits are appreciated.  Skin:  Skin is warm, dry and intact. No rash noted. Psychiatric: Mood and affect are normal. Speech and behavior are normal. Patient exhibits appropriate insight and judgment.  ____________________________________________    LABS (pertinent positives/negatives)  Trop 0.14 CBC wbc 11.3, hgb 15.8, plt 234 BMP na 137, k 2.5, cr 2.03 Repeat k 2.8 ____________________________________________   EKG  I, Nance Pear, attending physician, personally viewed and interpreted this EKG  EKG Time: 1435 Rate: 82 Rhythm: sinus rhythm with frequent pvc Axis: left axis deviation Intervals: qtc 602 QRS: wide ST changes: no st elevation Impression: abnormal ekg  ____________________________________________    RADIOLOGY  None  ____________________________________________   PROCEDURES  Procedures  ____________________________________________   INITIAL IMPRESSION  / ASSESSMENT AND PLAN / ED COURSE  Pertinent labs & imaging results that were available during my care of the patient were reviewed by me and considered in my medical decision making (see chart for details).   Presented to the emergency department today because of concerns for defibrillator discharge last night.  Was interrogated and showed 2 appropriate discharges.  Patient's potassium was noted to be 2.5.  Was given repletion and was rechecked and continue to be low at 2.8.  Will plan on admission.  ______________________________________   FINAL  CLINICAL IMPRESSION(S) / ED DIAGNOSES  Final diagnoses:  ICD (implantable cardioverter-defibrillator) discharge  Hypokalemia     Note: This dictation was prepared with Dragon dictation. Any transcriptional errors that result from this process are unintentional     Nance Pear, MD 08/19/18 2248

## 2018-08-19 NOTE — Telephone Encounter (Signed)
Spoke with Stephen Lye RN AT [facility]- ED informed her of Dr. Aquilla Hacker recommendations to increase Mexiletine 150mg  BID to 300mg  BID she stated that she would have the provider there consult [name]

## 2018-08-19 NOTE — ED Notes (Signed)
Admitting MD at the bedside.  

## 2018-08-19 NOTE — ED Triage Notes (Signed)
Pt to ED via pov with c/o his defib shocking him x2 last night. Pt c/o some dizziness and not " feeling normal". PT appears to be in no distress, VSS.

## 2018-08-19 NOTE — ED Notes (Signed)
Per MD Quentin Cornwall cp protocols at this time and defib interrogation

## 2018-08-19 NOTE — H&P (Signed)
[facility] NAME: Stephen [name]    MR#:  [id number]  DATE OF BIRTH:  [birth year]  DATE OF ADMISSION:  08/19/2018  PRIMARY CARE PHYSICIAN: Valerie Roys, DO   REQUESTING/REFERRING PHYSICIAN:   CHIEF COMPLAINT: Defibrillator shock  HISTORY OF PRESENT ILLNESS: Stephen [name]  is a 52 y.o. male with a known history of AICD, CKD, V. fib, CHF with ejection fraction around 15%. Patient presented to emergency room for defibrillator shock, twice last night.  Patient denies any chest pain, but he feels tired. Otherwise, patient feels at baseline except for some right calf tenderness, going on for the past few days.  He does not recall any trauma. Blood test done emergency room revealed low potassium level at 2.5 and elevated creatinine level at 2.03.  WBC count is 11.3.  Troponin level is 0.14. EKG, reviewed by myself, shows sinus rhythm with frequent PVCs at 82 beats per minute.  Left axis deviation is noted.  No ST elevation. Chest x-ray is negative for any acute abnormalities. Right lower extremity venous Doppler studies negative for DVT. Patient is admitted for further evaluation and treatment.  PAST MEDICAL HISTORY:   Past Medical History:  Diagnosis Date  . AICD (automatic cardioverter/defibrillator) present   . AKI (acute kidney injury) ([place]) 12/24/2017  . Arrhythmia   . Arthritis    "lower back, knees" (06/10/2018)  . Cardiac arrest ([place]) 12/23/2017   Brief V-fib arrest  . Chest pain 09/08/2017  . CHF (congestive heart failure) (HCC)    nonischemic cardiomyopathy, EF 25%  . Gout    "on daily RX" (06/10/2018)  . Headache    "q couple months" (06/10/2018)  . High cholesterol   . History of kidney stones   . Hypertension   . Influenza A 12/24/2017  . NICM (nonischemic cardiomyopathy) ([place])   . Pneumonia 12/20/2017  . TIA (transient ischemic attack) 06/21/2016   "still affects my memory a little bit" (06/10/2018)     PAST SURGICAL HISTORY:  Past Surgical History:  Procedure Laterality Date  . EXTERNAL FIXATION LEG Right ~ 2000   "was going bowlegged; had to brake my leg to fix it"  . HERNIA REPAIR    . ICD IMPLANT N/A 12/30/2017   Procedure: ICD IMPLANT;  Surgeon: Deboraha Sprang, MD;  Location: [facility];  Service: Cardiovascular;  Laterality: N/A;  . LEFT HEART CATH AND CORONARY ANGIOGRAPHY N/A 09/09/2017   Procedure: LEFT HEART CATH AND CORONARY ANGIOGRAPHY;  Surgeon: Teodoro Spray, MD;  Location: [facility];  Service: Cardiovascular;  Laterality: N/A;  . UMBILICAL HERNIA REPAIR  1990s  . V TACH ABLATION  06/10/2018  . V TACH ABLATION N/A 06/10/2018   Procedure: V TACH ABLATION;  Surgeon: Thompson Grayer, MD;  Location: [facility];  Service: Cardiovascular;  Laterality: N/A;  . VASECTOMY      SOCIAL HISTORY:  Social History   Tobacco Use  . Smoking status: Former Smoker    Packs/day: 0.33    Years: 33.00    Pack years: 10.89    Types: Cigarettes    Last attempt to quit: 02/20/2016    Years since quitting: 2.4  . Smokeless tobacco: Never Used  Substance Use Topics  . Alcohol use: Yes    Alcohol/week: 0.0 standard drinks    Comment: 06/10/2018 "beer once/month; if that"    FAMILY HISTORY:  Family History  Problem Relation Age of Onset  .  Hypertension Mother   . Heart failure Mother   . Hypertension Father   . CAD Father   . Heart attack Father     DRUG ALLERGIES:  Allergies  Allergen Reactions  . Lisinopril Cough    REVIEW OF SYSTEMS:   CONSTITUTIONAL: No fever, the patient complains of fatigue and generalized weakness.  EYES: No changes in vision.  EARS, NOSE, AND THROAT: No tinnitus or ear pain.  RESPIRATORY: Positive for chronic shortness of breath with exertion.  No cough, wheezing or hemoptysis.  CARDIOVASCULAR: No chest pain, orthopnea, edema.  GASTROINTESTINAL: No nausea, vomiting, diarrhea or abdominal pain.  GENITOURINARY: No  dysuria, hematuria.  ENDOCRINE: No polyuria, nocturia. HEMATOLOGY: No bleeding. SKIN: No rash or lesion. MUSCULOSKELETAL: Right calf pain.   NEUROLOGIC: No focal weakness.  PSYCHIATRY: No anxiety or depression.   MEDICATIONS AT HOME:  Prior to Admission medications   Medication Sig Start Date End Date Taking? Authorizing Provider  acetaminophen (TYLENOL) 325 MG tablet Take 2 tablets (650 mg total) by mouth every 6 (six) hours as needed. Patient taking differently: Take 325-650 mg by mouth every 6 (six) hours as needed for moderate pain.  03/19/18   Carrie Mew, MD  albuterol (PROVENTIL HFA;VENTOLIN HFA) 108 (90 Base) MCG/ACT inhaler Inhale 2 puffs into the lungs every 4 (four) hours as needed for wheezing or shortness of breath. 08/12/18   [name], MD  amiodarone (PACERONE) 200 MG tablet Take 2 tablets (400 mg total) by mouth daily. 07/25/18   Deboraha Sprang, MD  aspirin EC 81 MG EC tablet Take 1 tablet (81 mg total) by mouth daily. 01/01/18   Daune Perch, NP  atorvastatin (LIPITOR) 80 MG tablet Take 1 tablet (80 mg total) by mouth at bedtime. 07/06/18   Loletha Grayer, MD  Camphor-Eucalyptus-Menthol (CHEST RUB) 4.8-1.2-2.6 % OINT Apply 1 application topically daily as needed (congestion).     [provider]  carvedilol (COREG) 3.125 MG tablet Take 1 tablet (3.125 mg total) by mouth 2 (two) times daily with a meal. 07/06/18   Loletha Grayer, MD  clopidogrel (PLAVIX) 75 MG tablet Take 1 tablet (75 mg total) by mouth daily. 01/30/18   Bensimhon, Shaune Pascal, MD  colchicine 0.6 MG tablet Take 0.6 mg by mouth daily as needed (gout flare).     [provider]  diclofenac sodium (VOLTAREN) 1 % GEL Apply 2 g topically 4 (four) times daily as needed (pain).     [provider]  febuxostat (ULORIC) 40 MG tablet Take 1 tablet (40 mg total) by mouth daily. 05/19/18   Johnson, Megan P, DO  fluticasone (FLONASE) 50 MCG/ACT nasal spray Place 2 sprays into both nostrils  daily. Patient taking differently: Place 2 sprays into both nostrils daily as needed for allergies.  06/20/17   [name], MD  fluticasone-salmeterol (ADVAIR HFA) [phone] MCG/ACT inhaler Inhale 2 puffs into the lungs 2 (two) times daily. 08/12/18   [name], MD  guaiFENesin (MUCINEX) 600 MG 12 hr tablet Take 1 tablet (600 mg total) by mouth 2 (two) times daily as needed. 08/12/18 08/12/19  [name], MD  Menthol, Topical Analgesic, (ICY HOT EX) Apply 1 application topically daily as needed (pain).    [provider]  mexiletine (MEXITIL) 150 MG capsule Take 1 capsule (150 mg total) by mouth 2 (two) times daily. 07/09/18   Allred, Jeneen Rinks, MD  Multiple Vitamin (MULTIVITAMIN WITH MINERALS) TABS tablet Take 1 tablet by mouth daily.    [provider]  nitroGLYCERIN (  NITROSTAT) 0.4 MG SL tablet Place 1 tablet (0.4 mg total) under the tongue every 5 (five) minutes as needed for chest pain. 02/19/18   Bensimhon, Shaune Pascal, MD  potassium chloride SA (K-DUR,KLOR-CON) 20 MEQ tablet Take 1 tablet (20 mEq total) by mouth 2 (two) times daily. 07/06/18   Loletha Grayer, MD  sacubitril-valsartan (ENTRESTO) 24-26 MG Take 1 tablet by mouth 2 (two) times daily. 07/06/18   Loletha Grayer, MD  Tiotropium Bromide Monohydrate (SPIRIVA RESPIMAT) 2.5 MCG/ACT AERS Inhale 1 puff into the lungs daily. 08/12/18   [name], MD  torsemide (DEMADEX) 20 MG tablet Take 2 tablets (40 mg total) by mouth daily. 07/06/18   Loletha Grayer, MD      PHYSICAL EXAMINATION:   VITAL SIGNS: Blood pressure 97/76, pulse 65, temperature 98.6 F (37 C), temperature source Oral, resp. rate 15, height 6\' 1"  (1.854 m), weight 108 kg, SpO2 99 %.  GENERAL:  52 y.o.-year-old patient lying in the bed with no acute distress, at rest.  EYES: Pupils equal, round, reactive to light and accommodation. No scleral icterus. Extraocular muscles intact.  HEENT: Head atraumatic, normocephalic. Oropharynx and nasopharynx clear.  NECK:   Supple, no jugular venous distention. No thyroid enlargement, no tenderness.  LUNGS: Reduced breath sounds bilaterally, no wheezing, rales,rhonchi or crepitation. No use of accessory muscles of respiration.  CARDIOVASCULAR: S1, S2 normal. No S3/S4.  ABDOMEN: Soft, nontender, nondistended. Bowel sounds present. No organomegaly or mass.  EXTREMITIES: No pedal edema, cyanosis, or clubbing.  NEUROLOGIC: Cranial nerves II through XII are intact. Muscle strength 5/5 in all extremities. Sensation intact.   PSYCHIATRIC: The patient is alert and oriented x 3.  SKIN: No obvious rash, lesion, or ulcer.   LABORATORY PANEL:   CBC Recent Labs  Lab 08/19/18 1449  WBC 11.3*  HGB 15.8  HCT 46.8  PLT 234  MCV 88.0  MCH 29.7  MCHC 33.7  RDW 15.7*   ------------------------------------------------------------------------------------------------------------------  Chemistries  Recent Labs  Lab 08/19/18 1449 08/19/18 1849 08/19/18 2051  NA 137  --   --   K 2.5*  --  2.8*  CL 93*  --   --   CO2 30  --   --   GLUCOSE 140*  --   --   BUN 63*  --   --   CREATININE 2.03*  --   --   CALCIUM 9.8  --   --   MG  --  2.3  --    ------------------------------------------------------------------------------------------------------------------ estimated creatinine clearance is 54.2 mL/min (A) (by C-G formula based on SCr of 2.03 mg/dL (H)). ------------------------------------------------------------------------------------------------------------------ No results for input(s): TSH, T4TOTAL, T3FREE, THYROIDAB in the last 72 hours.  Invalid input(s): FREET3   Coagulation profile No results for input(s): INR, PROTIME in the last 168 hours. ------------------------------------------------------------------------------------------------------------------- No results for input(s): DDIMER in the last 72  hours. -------------------------------------------------------------------------------------------------------------------  Cardiac Enzymes Recent Labs  Lab 08/19/18 1449  TROPONINI 0.14*   ------------------------------------------------------------------------------------------------------------------ Invalid input(s): POCBNP  ---------------------------------------------------------------------------------------------------------------  Urinalysis    Component Value Date/Time   COLORURINE YELLOW (A) 06/30/2018 2211   APPEARANCEUR CLEAR (A) 06/30/2018 2211   LABSPEC 1.008 06/30/2018 2211   PHURINE 6.0 06/30/2018 2211   GLUCOSEU NEGATIVE 06/30/2018 2211   HGBUR NEGATIVE 06/30/2018 2211   BILIRUBINUR NEGATIVE 06/30/2018 2211   KETONESUR NEGATIVE 06/30/2018 2211   PROTEINUR NEGATIVE 06/30/2018 2211   NITRITE NEGATIVE 06/30/2018 2211   LEUKOCYTESUR NEGATIVE 06/30/2018 2211     RADIOLOGY: Dg Chest 2 View  Result Date: 08/19/2018  CLINICAL DATA:  Pt to ED via POV with c/o his defibrillator shocking him x2 last night. Pt c/o some dizziness and not " feeling normal", history of cardiac arrest, CHF, hypertension, TIA EXAM: CHEST - 2 VIEW COMPARISON:  06/30/2018 FINDINGS: LEFT-sided AICD lead to the RIGHT ventricle, unchanged. Heart is enlarged. There is no pulmonary edema. No focal consolidations or pleural effusions. IMPRESSION: Stable cardiomegaly. Electronically Signed   By: Nolon Nations M.D.   On: 08/19/2018 15:45   US Venous Img Lower Unilateral Right  Result Date: 08/19/2018 CLINICAL DATA:  Swelling in pain in RIGHT lower extremity for 2 days EXAM: RIGHT LOWER EXTREMITY VENOUS DOPPLER ULTRASOUND TECHNIQUE: Gray-scale sonography with graded compression, as well as color Doppler and duplex ultrasound were performed to evaluate the lower extremity deep venous systems from the level of the common femoral vein and including the common femoral, femoral, profunda femoral, popliteal  and calf veins including the posterior tibial, peroneal and gastrocnemius veins when visible. The superficial great saphenous vein was also interrogated. Spectral Doppler was utilized to evaluate flow at rest and with distal augmentation maneuvers in the common femoral, femoral and popliteal veins. COMPARISON:  None FINDINGS: Contralateral Common Femoral Vein: Respiratory phasicity is normal and symmetric with the symptomatic side. No evidence of thrombus. Normal compressibility. Common Femoral Vein: No evidence of thrombus. Normal compressibility, respiratory phasicity and response to augmentation. Saphenofemoral Junction: No evidence of thrombus. Normal compressibility and flow on color Doppler imaging. Profunda Femoral Vein: No evidence of thrombus. Normal compressibility and flow on color Doppler imaging. Femoral Vein: No evidence of thrombus. Normal compressibility, respiratory phasicity and response to augmentation. Popliteal Vein: No evidence of thrombus. Normal compressibility, respiratory phasicity and response to augmentation. Calf Veins: Limited assessment. Superficial Great Saphenous Vein: No evidence of thrombus. Normal compressibility. Venous Reflux:  None. Other Findings: Probable small complicated Baker cyst RIGHT popliteal fossa 3.0 x 0.9 x 1.6 cm. IMPRESSION: No evidence of deep venous thrombosis in the RIGHT lower extremity as above. Probable small complicated Baker's cyst. Electronically Signed   By: Lavonia Dana M.D.   On: 08/19/2018 18:12    EKG: Orders placed or performed during the hospital encounter of 08/19/18  . EKG 12-Lead  . EKG 12-Lead  . ED EKG within 10 minutes  . ED EKG within 10 minutes    IMPRESSION AND PLAN:  1.  Defibrillator shock.  This could have been precipitated by low potassium and dehydration.  Will replace the potassium per protocol.  The device was checked.  Will consult cardiology for evaluation of the diuretic dose. 2.  Hypokalemia.  Potassium level is  2.5.  Will replace potassium per protocol.  Patient admits to being noncompliant with potassium supplement. 2. ARF/CKD3.  Creatinine level is 2.03.  Baseline seems to be around 1.5 in this patient.  Continue to monitor kidney function closely and avoid nephrotoxic medications.  Will consult cardiology to reevaluate the diuretic dose. 3.  Nonischemic cardiomyopathy with ejection fraction around 15%.  Continue medical treatment.  All the records are reviewed and case discussed with ED provider. Management plans discussed with the patient, who is in agreement.  CODE STATUS: Code Status History    Date Active Date Inactive Code Status Order ID Comments User Context   06/30/2018 1757 07/06/2018 1647 Full Code [id number]  [name], MD Inpatient   06/10/2018 1606 06/11/2018 1537 Full Code [id number]  Thompson Grayer, MD Inpatient   12/23/2017 1153 12/31/2017 1729 Full Code [id number]  Erlene Quan, PA-C Inpatient  12/20/2017 1851 12/23/2017 1110 Full Code [id number]  [name], MD Inpatient   09/08/2017 0644 09/10/2017 2127 Full Code [id number]  Harrie Foreman, MD Inpatient   07/29/2017 1044 07/31/2017 2152 Full Code [id number]  Henreitta Leber, MD Inpatient   06/19/2017 0027 06/20/2017 2114 Full Code [id number]  Lance Coon, MD ED   05/20/2017 1226 05/21/2017 1759 Full Code [id number]  [name], MD Inpatient   06/22/2016 0430 06/22/2016 2150 Full Code [id number]  Quintella Baton, MD Inpatient   06/04/2016 1843 06/06/2016 1815 Full Code [id number]  [name], MD Inpatient       TOTAL TIME TAKING CARE OF THIS PATIENT: 45 minutes.    Amelia Jo M.D on 08/19/2018 at 9:49 PM  Between 7am to 6pm - Pager - [phone]  After 6pm go to [email or web address] - password [email or web address] [facility]  [phone]  CC: Primary care physician; Valerie Roys, DO

## 2018-08-19 NOTE — ED Notes (Signed)
[employer] contacted at this time due to integrator not responding to pt pacer. States on call staff will call us back .

## 2018-08-20 LAB — BASIC METABOLIC PANEL
Anion gap: 8 (ref 5–15)
Anion gap: 11 (ref 5–15)
BUN: 61 mg/dL — AB (ref 6–20)
BUN: 55 mg/dL — AB (ref 6–20)
CALCIUM: 9.2 mg/dL (ref 8.9–10.3)
CALCIUM: 9.1 mg/dL (ref 8.9–10.3)
CHLORIDE: 99 mmol/L (ref 98–111)
CO2: 32 mmol/L (ref 22–32)
CO2: 29 mmol/L (ref 22–32)
CREATININE: 1.74 mg/dL — AB (ref 0.61–1.24)
Chloride: 98 mmol/L (ref 98–111)
Creatinine, Ser: 1.89 mg/dL — ABNORMAL HIGH (ref 0.61–1.24)
GFR calc Af Amer: 50 mL/min — ABNORMAL LOW (ref >60)
GFR calc Af Amer: 45 mL/min — ABNORMAL LOW (ref >60)
GFR calc non Af Amer: 43 mL/min — ABNORMAL LOW (ref >60)
GFR, EST NON AFRICAN AMERICAN: 39 mL/min — AB (ref >60)
GLUCOSE: 126 mg/dL — AB (ref 70–99)
GLUCOSE: 208 mg/dL — AB (ref 70–99)
Potassium: 3 mmol/L — ABNORMAL LOW (ref 3.5–5.1)
Potassium: 3 mmol/L — ABNORMAL LOW (ref 3.5–5.1)
Sodium: 139 mmol/L (ref 135–145)
Sodium: 138 mmol/L (ref 135–145)

## 2018-08-20 LAB — CBC
HCT: 42.6 % (ref 40.0–52.0)
Hemoglobin: 14.4 g/dL (ref 13.0–18.0)
MCH: 29.6 pg (ref 26.0–34.0)
MCHC: 33.9 g/dL (ref 32.0–36.0)
MCV: 87.4 fL (ref 80.0–100.0)
PLATELETS: 219 10*3/uL (ref 150–440)
RBC: 4.88 MIL/uL (ref 4.40–5.90)
RDW: 16 % — ABNORMAL HIGH (ref 11.5–14.5)
WBC: 10.5 10*3/uL (ref 3.8–10.6)

## 2018-08-20 LAB — POTASSIUM: Potassium: 3.6 mmol/L (ref 3.5–5.1)

## 2018-08-20 LAB — GLUCOSE, CAPILLARY: GLUCOSE-CAPILLARY: 137 mg/dL — AB (ref 70–99)

## 2018-08-20 MED ORDER — SODIUM CHLORIDE 0.9 % IV SOLN
INTRAVENOUS | Status: DC | PRN
  Administered 2018-08-20: 12:00:00 via INTRAVENOUS

## 2018-08-20 MED ORDER — POTASSIUM CHLORIDE CRYS ER 20 MEQ PO TBCR
20.0000 meq | EXTENDED_RELEASE_TABLET | ORAL | Status: AC
  Administered 2018-08-20 (×3): 20 meq via ORAL
  Filled 2018-08-20 (×3): qty 1

## 2018-08-20 MED ORDER — POTASSIUM CHLORIDE 10 MEQ/100ML IV SOLN
10.0000 meq | INTRAVENOUS | Status: AC
  Administered 2018-08-20: 10 meq via INTRAVENOUS
  Filled 2018-08-20: qty 100

## 2018-08-20 MED ORDER — POTASSIUM CHLORIDE 10 MEQ/100ML IV SOLN
10.0000 meq | INTRAVENOUS | Status: AC
  Administered 2018-08-20 (×2): 10 meq via INTRAVENOUS
  Filled 2018-08-20 (×2): qty 100

## 2018-08-20 NOTE — Progress Notes (Signed)
[place] for potassium replacement Indication: *hypokalemia  Allergies  Allergen Reactions  . Lisinopril Cough    Patient Measurements: Height: 6\' 1"  (185.4 cm) Weight: 239 lb 6.7 oz (108.6 kg) IBW/kg (Calculated) : 79.9  Vital Signs: Temp: 98.1 F (36.7 C) (08/21 0418) Temp Source: Oral (08/21 0418) BP: 104/62 (08/21 0834) Pulse Rate: 78 (08/21 0834) Intake/Output from previous day: 08/20 0701 - 08/21 0700 In: 464.3 [IV Piggyback:464.3] Out: 400 [Urine:400] Intake/Output from this shift: No intake/output data recorded.  Labs: Recent Labs    08/19/18 1449 08/19/18 1849 08/20/18 0318 08/20/18 0951  WBC 11.3*  --  10.5  --   HGB 15.8  --  14.4  --   HCT 46.8  --  42.6  --   PLT 234  --  219  --   CREATININE 2.03*  --  1.74* 1.89*  MG  --  2.3  --   --    Estimated Creatinine Clearance: 58.4 mL/min (A) (by C-G formula based on SCr of 1.89 mg/dL (H)).   Microbiology: No results found for this or any previous visit (from the past 720 hour(s)).  Medical History: Past Medical History:  Diagnosis Date  . AICD (automatic cardioverter/defibrillator) present   . AKI (acute kidney injury) ([place]) 12/24/2017  . Arrhythmia   . Arthritis    "lower back, knees" (06/10/2018)  . Cardiac arrest ([place]) 12/23/2017   Brief V-fib arrest  . Chest pain 09/08/2017  . CHF (congestive heart failure) (HCC)    nonischemic cardiomyopathy, EF 25%  . Gout    "on daily RX" (06/10/2018)  . Headache    "q couple months" (06/10/2018)  . High cholesterol   . History of kidney stones   . Hypertension   . Influenza A 12/24/2017  . NICM (nonischemic cardiomyopathy) ([place])   . Pneumonia 12/20/2017  . TIA (transient ischemic attack) 06/21/2016   "still affects my memory a little bit" (06/10/2018)    Assessment: 52 y.o. male with a known history of AICD, CKD, V. fib, CHF with EF~ 15% presented to emergency room for defibrillator shock, twice last night  likely secondary to hypokalemia. He was admitted with K 2.5 mmol/L. Since that lab reading he has received the following potassium supplementation with most reading of 3.0 mmol/L:  PO KCl: 50mEq IV KCL: 26mEq  Plan:  KCl 50mEq IV KCl and KCl 75mEq po  Recheck potassium following this replacement therapy  Dallie Piles, PharmD 08/20/2018,10:28 AM

## 2018-08-20 NOTE — Consult Note (Signed)
Reason for Consult: AICD discharges ventricular tachycardia cardiomyopathy Referring Physician: Dr. Posey Pronto hospitalist Dr. Park Liter primary Cardiologist [facility]  Stephen [name] is an 52 y.o. male.  HPI: Patient is a 52 year old black male with known nonischemic cardiomyopathy.  Severe systolic dysfunction ejection fraction around 15% with AICD pacemaker in place.  Patient has been doing reasonably well but recently had diuretics increased because of significant heart failure.  Patient now presents after 2 electrical discharges from his AICD which woke him up from sleep.  Patient found to have have significantly low potassium less than 3 in the emergency room.  Patient states he has not had any shocks like this recently denied any chest pain has been compliant with his medication.  Patient states his shortness of breath leg edema is been reasonably controlled with the new changes in medications.  Patient has significant renal insufficiency being followed by nephrology.  Has had his defibrillator interrogated recently  Past Medical History:  Diagnosis Date  . AICD (automatic cardioverter/defibrillator) present   . AKI (acute kidney injury) ([place]) 12/24/2017  . Arrhythmia   . Arthritis    "lower back, knees" (06/10/2018)  . Cardiac arrest ([place]) 12/23/2017   Brief V-fib arrest  . Chest pain 09/08/2017  . CHF (congestive heart failure) ([facility])    nonischemic cardiomyopathy, EF 25%  . Gout    "on daily RX" (06/10/2018)  . Headache    "q couple months" (06/10/2018)  . High cholesterol   . History of kidney stones   . Hypertension   . Influenza A 12/24/2017  . NICM (nonischemic cardiomyopathy) ([place])   . Pneumonia 12/20/2017  . TIA (transient ischemic attack) 06/21/2016   "still affects my memory a little bit" (06/10/2018)    Past Surgical History:  Procedure Laterality Date  . EXTERNAL FIXATION LEG Right ~ 2000   "was going bowlegged; had to brake my leg to fix it"  . HERNIA REPAIR    .  ICD IMPLANT N/A 12/30/2017   Procedure: ICD IMPLANT;  Surgeon: Deboraha Sprang, MD;  Location: [facility];  Service: Cardiovascular;  Laterality: N/A;  . LEFT HEART CATH AND CORONARY ANGIOGRAPHY N/A 09/09/2017   Procedure: LEFT HEART CATH AND CORONARY ANGIOGRAPHY;  Surgeon: Teodoro Spray, MD;  Location: [facility];  Service: Cardiovascular;  Laterality: N/A;  . UMBILICAL HERNIA REPAIR  1990s  . V TACH ABLATION  06/10/2018  . V TACH ABLATION N/A 06/10/2018   Procedure: V TACH ABLATION;  Surgeon: Thompson Grayer, MD;  Location: [facility];  Service: Cardiovascular;  Laterality: N/A;  . VASECTOMY      Family History  Problem Relation Age of Onset  . Hypertension Mother   . Heart failure Mother   . Hypertension Father   . CAD Father   . Heart attack Father     Social History:  reports that he quit smoking about 2 years ago. His smoking use included cigarettes. He has a 10.89 pack-year smoking history. He has never used smokeless tobacco. He reports that he drinks alcohol. He reports that he does not use drugs.  Allergies:  Allergies  Allergen Reactions  . Lisinopril Cough    Medications: I have reviewed the patient's current medications.  Results for orders placed or performed during the hospital encounter of 08/19/18 (from the past 48 hour(s))  Basic metabolic panel     Status: Abnormal   Collection Time: 08/19/18  2:49 PM  Result Value Ref Range   Sodium  137 135 - 145 mmol/L   Potassium 2.5 (LL) 3.5 - 5.1 mmol/L    Comment: CRITICAL RESULT CALLED TO, READ BACK BY AND VERIFIED WITH STEPHEN JONES _0  08/19/18 AKT   Chloride 93 (L) 98 - 111 mmol/L   CO2 30 22 - 32 mmol/L   Glucose, Bld 140 (H) 70 - 99 mg/dL   BUN 63 (H) 6 - 20 mg/dL   Creatinine, Ser 2.03 (H) 0.61 - 1.24 mg/dL   Calcium 9.8 8.9 - 10.3 mg/dL   GFR calc non Af Amer 36 (L) >60 mL/min   GFR calc Af Amer 41 (L) >60 mL/min    Comment: (NOTE) The eGFR has been calculated using the CKD EPI  equation. This calculation has not been validated in all clinical situations. eGFR's persistently <60 mL/min signify possible Chronic Kidney Disease.    Anion gap 14 5 - 15    Comment: Performed at [facility], [address] [place], [place] [address]  CBC     Status: Abnormal   Collection Time: 08/19/18  2:49 PM  Result Value Ref Range   WBC 11.3 (H) 3.8 - 10.6 K/uL   RBC 5.32 4.40 - 5.90 MIL/uL   Hemoglobin 15.8 13.0 - 18.0 g/dL   HCT 46.8 40.0 - 52.0 %   MCV 88.0 80.0 - 100.0 fL   MCH 29.7 26.0 - 34.0 pg   MCHC 33.7 32.0 - 36.0 g/dL   RDW 15.7 (H) 11.5 - 14.5 %   Platelets 234 150 - 440 K/uL    Comment: Performed at [facility], [address] [place], [place] [address]  Troponin I     Status: Abnormal   Collection Time: 08/19/18  2:49 PM  Result Value Ref Range   Troponin I 0.14 (HH) <0.03 ng/mL    Comment: CRITICAL RESULT CALLED TO, READ BACK BY AND VERIFIED WITH STEPHEN JONES _1  08/19/18 AKT Performed at [facility], [address] [place], [place] [address]   Magnesium     Status: None   Collection Time: 08/19/18  6:49 PM  Result Value Ref Range   Magnesium 2.3 1.7 - 2.4 mg/dL    Comment: Performed at [facility], [address] [place], [place] [address]  Potassium     Status: Abnormal   Collection Time: 08/19/18  8:51 PM  Result Value Ref Range   Potassium 2.8 (L) 3.5 - 5.1 mmol/L    Comment: Performed at [facility], [address] [place], [place] [address]  Basic metabolic panel     Status: Abnormal   Collection Time: 08/20/18  3:18 AM  Result Value Ref Range   Sodium 139 135 - 145 mmol/L   Potassium 3.0 (L) 3.5 - 5.1 mmol/L   Chloride 99 98 - 111 mmol/L   CO2 32 22 - 32 mmol/L   Glucose, Bld 126 (H) 70 - 99 mg/dL   BUN 61 (H) 6 - 20 mg/dL   Creatinine, Ser 1.74 (H) 0.61 - 1.24 mg/dL   Calcium 9.2 8.9 - 10.3 mg/dL   GFR calc non Af Amer 43 (L) >60 mL/min   GFR calc Af Amer 50 (L) >60 mL/min     Comment: (NOTE) The eGFR has been calculated using the CKD EPI equation. This calculation has not been validated in all clinical situations. eGFR's persistently <60 mL/min signify possible Chronic Kidney Disease.    Anion gap 8 5 - 15    Comment: Performed at [facility], [address] [place], [place] [address]  CBC     Status: Abnormal   Collection Time: 08/20/18  3:18 AM  Result Value Ref Range   WBC 10.5 3.8 - 10.6 K/uL   RBC 4.88 4.40 - 5.90 MIL/uL   Hemoglobin 14.4 13.0 - 18.0 g/dL   HCT 42.6 40.0 - 52.0 %   MCV 87.4 80.0 - 100.0 fL   MCH 29.6 26.0 - 34.0 pg   MCHC 33.9 32.0 - 36.0 g/dL   RDW 16.0 (H) 11.5 - 14.5 %   Platelets 219 150 - 440 K/uL    Comment: Performed at [facility], [address] [place], [place] [address]  Glucose, capillary     Status: Abnormal   Collection Time: 08/20/18  7:43 AM  Result Value Ref Range   Glucose-Capillary 137 (H) 70 - 99 mg/dL  Basic metabolic panel     Status: Abnormal   Collection Time: 08/20/18  9:51 AM  Result Value Ref Range   Sodium 138 135 - 145 mmol/L   Potassium 3.0 (L) 3.5 - 5.1 mmol/L   Chloride 98 98 - 111 mmol/L   CO2 29 22 - 32 mmol/L   Glucose, Bld 208 (H) 70 - 99 mg/dL   BUN 55 (H) 6 - 20 mg/dL   Creatinine, Ser 1.89 (H) 0.61 - 1.24 mg/dL   Calcium 9.1 8.9 - 10.3 mg/dL   GFR calc non Af Amer 39 (L) >60 mL/min   GFR calc Af Amer 45 (L) >60 mL/min    Comment: (NOTE) The eGFR has been calculated using the CKD EPI equation. This calculation has not been validated in all clinical situations. eGFR's persistently <60 mL/min signify possible Chronic Kidney Disease.    Anion gap 11 5 - 15    Comment: Performed at [facility], [address] [place], [place] [address]    Dg Chest 2 View  Result Date: 08/19/2018 CLINICAL DATA:  Pt to ED via [place] with c/o his defibrillator shocking him x2 last night. Pt c/o some dizziness and not " feeling normal", history of cardiac arrest, CHF,  hypertension, TIA EXAM: CHEST - 2 VIEW COMPARISON:  06/30/2018 FINDINGS: LEFT-sided AICD lead to the RIGHT ventricle, unchanged. Heart is enlarged. There is no pulmonary edema. No focal consolidations or pleural effusions. IMPRESSION: Stable cardiomegaly. Electronically Signed   By: Nolon Nations M.D.   On: 08/19/2018 15:45   US Venous Img Lower Unilateral Right  Result Date: 08/19/2018 CLINICAL DATA:  Swelling in pain in RIGHT lower extremity for 2 days EXAM: RIGHT LOWER EXTREMITY VENOUS DOPPLER ULTRASOUND TECHNIQUE: Gray-scale sonography with graded compression, as well as color Doppler and duplex ultrasound were performed to evaluate the lower extremity deep venous systems from the level of the common femoral vein and including the common femoral, femoral, profunda femoral, popliteal and calf veins including the posterior tibial, peroneal and gastrocnemius veins when visible. The superficial great saphenous vein was also interrogated. Spectral Doppler was utilized to evaluate flow at rest and with distal augmentation maneuvers in the common femoral, femoral and popliteal veins. COMPARISON:  None FINDINGS: Contralateral Common Femoral Vein: Respiratory phasicity is normal and symmetric with the symptomatic side. No evidence of thrombus. Normal compressibility. Common Femoral Vein: No evidence of thrombus. Normal compressibility, respiratory phasicity and response to augmentation. Saphenofemoral Junction: No evidence of thrombus. Normal compressibility and flow on color Doppler imaging. Profunda Femoral Vein: No evidence of thrombus. Normal compressibility and flow on color Doppler imaging. Femoral Vein: No  evidence of thrombus. Normal compressibility, respiratory phasicity and response to augmentation. Popliteal Vein: No evidence of thrombus. Normal compressibility, respiratory phasicity and response to augmentation. Calf Veins: Limited assessment. Superficial Great Saphenous Vein: No evidence of thrombus.  Normal compressibility. Venous Reflux:  None. Other Findings: Probable small complicated Baker cyst RIGHT popliteal fossa 3.0 x 0.9 x 1.6 cm. IMPRESSION: No evidence of deep venous thrombosis in the RIGHT lower extremity as above. Probable small complicated Baker's cyst. Electronically Signed   By: Lavonia Dana M.D.   On: 08/19/2018 18:12    Review of Systems  Constitutional: Positive for diaphoresis and malaise/fatigue.  HENT: Positive for congestion.   Eyes: Negative.   Respiratory: Positive for shortness of breath.   Cardiovascular: Positive for palpitations, orthopnea, leg swelling and PND.  Gastrointestinal: Negative.   Genitourinary: Negative.   Musculoskeletal: Negative.   Skin: Negative.   Neurological: Positive for dizziness and weakness.  Endo/Heme/Allergies: Negative.   Psychiatric/Behavioral: The patient is nervous/anxious.    Blood pressure 104/62, pulse 78, temperature 98.1 F (36.7 C), temperature source Oral, resp. rate 19, height 6' 1" (1.854 m), weight 108.6 kg, SpO2 99 %. Physical Exam  Nursing note and vitals reviewed. Constitutional: He is oriented to person, place, and time. He appears well-developed and well-nourished.  HENT:  Head: Normocephalic and atraumatic.  Eyes: Pupils are equal, round, and reactive to light. Conjunctivae and EOM are normal.  Neck: Normal range of motion. Neck supple.  Cardiovascular: Normal rate, regular rhythm and intact distal pulses. Exam reveals gallop.  Murmur heard. Respiratory: Effort normal and breath sounds normal.  GI: Soft. Bowel sounds are normal.  Musculoskeletal: Normal range of motion. He exhibits edema.  Neurological: He is alert and oriented to person, place, and time. He has normal reflexes.  Skin: Skin is warm and dry.    Assessment/Plan: AICD discharge x2 Ventricular tachycardia Nonischemic cardiomyopathy History of heart failure systolic dysfunction Cardiomyopathy systolic Hypertension Renal insufficiency  stage III Borderline obesity Borderline troponins Hypokalemia Defibrillator pacemaker in place . Plan Agree with admit rule out for myocardial infarction Agree with follow-up troponins and EKGs Correct electrolytes Continue diuretic therapy Agree with interrogation of AICD device Refer the patient to nephrology for renal insufficiency Troponin elevation probably related to DC cardioversion Continue amiodarone therapy for ventricular tachycardia  Markisha Meding D Alley Neils 08/20/2018, 1:44 PM

## 2018-08-20 NOTE — Progress Notes (Addendum)
Electrolyte replacement consult  Pharmacy consulted to replace electrolytes in this 52 y/o M.  K+ 2.8. K runs ordered by MD. Edmonia Lynch BMP with tomorrow AM labs.  8/21 0330 K+ 3.0. 20 mEq PO x 3 doses ordered. BMP with AM labs.  Sim Boast, PharmD, BCPS  08/20/18 12:45 AM

## 2018-08-20 NOTE — Progress Notes (Signed)
Patient indicates he has transitioned from [facility] to [facility] and wants to stay with them. Please consult [facility] for inpatient consults and follow up with them as an outpatient at his request.

## 2018-08-20 NOTE — Discharge Summary (Signed)
[facility] NAME: Stephen [name]    MR#:  [id number]  DATE OF BIRTH:  10-31-1966  DATE OF ADMISSION:  08/19/2018 ADMITTING PHYSICIAN: Amelia Jo, MD  DATE OF DISCHARGE: 08/20/2018  PRIMARY CARE PHYSICIAN: Valerie Roys, DO    ADMISSION DIAGNOSIS:  Hypokalemia [E87.6] ICD (implantable cardioverter-defibrillator) discharge [Z45.02]  DISCHARGE DIAGNOSIS:  Hypokalemia--resolved  SECONDARY DIAGNOSIS:   Past Medical History:  Diagnosis Date  . AICD (automatic cardioverter/defibrillator) present   . AKI (acute kidney injury) ([place]) 12/24/2017  . Arrhythmia   . Arthritis    "lower back, knees" (06/10/2018)  . Cardiac arrest ([place]) 12/23/2017   Brief V-fib arrest  . Chest pain 09/08/2017  . CHF (congestive heart failure) ([facility])    nonischemic cardiomyopathy, EF 25%  . Gout    "on daily RX" (06/10/2018)  . Headache    "q couple months" (06/10/2018)  . High cholesterol   . History of kidney stones   . Hypertension   . Influenza A 12/24/2017  . NICM (nonischemic cardiomyopathy) ([place])   . Pneumonia 12/20/2017  . TIA (transient ischemic attack) 06/21/2016   "still affects my memory a little bit" (06/10/2018)    HOSPITAL COURSE:  Stephen [name]  is a 52 y.o. male with a known history of AICD, CKD, V. fib, CHF with ejection fraction around 15%. Patient presented to emergency room for defibrillator shock, twice last night.  Patient denies any chest pain, but he feels tired.  1.  Defibrillator shock.  This could have been precipitated by low potassium and dehydration.  Will replace the potassium per protocol.  The device was checked.   - consulted cardiology with dr Clayborn Bigness -tele showed PVC's  2.  Hypokalemia.  Potassium level is 2.5.  Will replace potassium per protocol.  Patient admits to being noncompliant with potassium supplement. -k now 3.6 -Mag is 2.3  -2. ARF/CKD3.  Creatinine level is 2.03.---1.74--1.8 -   Baseline seems to be around 1.5 in this patient.  Continue to monitor kidney function closely and avoid nephrotoxic medications.   3.  Nonischemic cardiomyopathy with ejection fraction around 15%.  Continue medical treatment.  Overall doing well Will d/c with out pt cardiology f/u Pt was educated regarding importance of taking K at home   CONSULTS OBTAINED:  Treatment Team:  Yolonda Kida, MD  DRUG ALLERGIES:   Allergies  Allergen Reactions  . Lisinopril Cough    DISCHARGE MEDICATIONS:   Allergies as of 08/20/2018      Reactions   Lisinopril Cough      Medication List    STOP taking these medications   febuxostat 40 MG tablet Commonly known as:  ULORIC     TAKE these medications   acetaminophen 325 MG tablet Commonly known as:  TYLENOL Take 2 tablets (650 mg total) by mouth every 6 (six) hours as needed. What changed:    how much to take  reasons to take this   albuterol 108 (90 Base) MCG/ACT inhaler Commonly known as:  PROVENTIL HFA;VENTOLIN HFA Inhale 2 puffs into the lungs every 4 (four) hours as needed for wheezing or shortness of breath.   amiodarone 200 MG tablet Commonly known as:  PACERONE Take 2 tablets (400 mg total) by mouth daily.   aspirin 81 MG EC tablet Take 1 tablet (81 mg total) by mouth daily.   atorvastatin 80 MG tablet Commonly known as:  LIPITOR Take 1 tablet (80 mg total) by mouth at  bedtime.   carvedilol 3.125 MG tablet Commonly known as:  COREG Take 1 tablet (3.125 mg total) by mouth 2 (two) times daily with a meal.   Chest Rub 4.8-1.2-2.6 % Oint Apply 1 application topically daily as needed (congestion).   clopidogrel 75 MG tablet Commonly known as:  PLAVIX Take 1 tablet (75 mg total) by mouth daily.   colchicine 0.6 MG tablet Take 0.6 mg by mouth daily as needed (gout flare).   diclofenac sodium 1 % Gel Commonly known as:  VOLTAREN Apply 2 g topically 4 (four) times daily as needed (pain).   fluticasone 50  MCG/ACT nasal spray Commonly known as:  FLONASE Place 2 sprays into both nostrils daily. What changed:    when to take this  reasons to take this   fluticasone-salmeterol 115-21 MCG/ACT inhaler Commonly known as:  ADVAIR HFA Inhale 2 puffs into the lungs 2 (two) times daily.   guaiFENesin 600 MG 12 hr tablet Commonly known as:  MUCINEX Take 1 tablet (600 mg total) by mouth 2 (two) times daily as needed.   ICY HOT EX Apply 1 application topically daily as needed (pain).   mexiletine 150 MG capsule Commonly known as:  MEXITIL Take 1 capsule (150 mg total) by mouth 2 (two) times daily.   multivitamin with minerals Tabs tablet Take 1 tablet by mouth daily.   nitroGLYCERIN 0.4 MG SL tablet Commonly known as:  NITROSTAT Place 1 tablet (0.4 mg total) under the tongue every 5 (five) minutes as needed for chest pain.   potassium chloride SA 20 MEQ tablet Commonly known as:  K-DUR,KLOR-CON Take 1 tablet (20 mEq total) by mouth 2 (two) times daily.   sacubitril-valsartan 24-26 MG Commonly known as:  ENTRESTO Take 1 tablet by mouth 2 (two) times daily.   Tiotropium Bromide Monohydrate 2.5 MCG/ACT Aers Inhale 1 puff into the lungs daily.   torsemide 20 MG tablet Commonly known as:  DEMADEX Take 2 tablets (40 mg total) by mouth daily.       If you experience worsening of your admission symptoms, develop shortness of breath, life threatening emergency, suicidal or homicidal thoughts you must seek medical attention immediately by calling 911 or calling your MD immediately  if symptoms less severe.  You Must read complete instructions/literature along with all the possible adverse reactions/side effects for all the Medicines you take and that have been prescribed to you. Take any new Medicines after you have completely understood and accept all the possible adverse reactions/side effects.   Please note  You were cared for by a hospitalist during your hospital stay. If you have  any questions about your discharge medications or the care you received while you were in the hospital after you are discharged, you can call the unit and asked to speak with the hospitalist on call if the hospitalist that took care of you is not available. Once you are discharged, your primary care physician will handle any further medical issues. Please note that NO REFILLS for any discharge medications will be authorized once you are discharged, as it is imperative that you return to your primary care physician (or establish a relationship with a primary care physician if you do not have one) for your aftercare needs so that they can reassess your need for medications and monitor your lab values. Today   SUBJECTIVE   No new complaints weak  VITAL SIGNS:  Blood pressure 108/86, pulse 74, temperature 98.1 F (36.7 C), temperature source Oral, resp. rate  19, height 6\' 1"  (1.854 m), weight 108.6 kg, SpO2 99 %.  I/O:    Intake/Output Summary (Last 24 hours) at 08/20/2018 1755 Last data filed at 08/20/2018 1655 Gross per 24 hour  Intake 704.31 ml  Output 475 ml  Net 229.31 ml    PHYSICAL EXAMINATION:  GENERAL:  52 y.o.-year-old patient lying in the bed with no acute distress.  EYES: Pupils equal, round, reactive to light and accommodation. No scleral icterus. Extraocular muscles intact.  HEENT: Head atraumatic, normocephalic. Oropharynx and nasopharynx clear.  NECK:  Supple, no jugular venous distention. No thyroid enlargement, no tenderness.  LUNGS: Normal breath sounds bilaterally, no wheezing, rales,rhonchi or crepitation. No use of accessory muscles of respiration.  CARDIOVASCULAR: S1, S2 normal. No murmurs, rubs, or gallops.  ABDOMEN: Soft, non-tender, non-distended. Bowel sounds present. No organomegaly or mass.  EXTREMITIES: No pedal edema, cyanosis, or clubbing.  NEUROLOGIC: Cranial nerves II through XII are intact. Muscle strength 5/5 in all extremities. Sensation intact. Gait not  checked.  PSYCHIATRIC: The patient is alert and oriented x 3.  SKIN: No obvious rash, lesion, or ulcer.   DATA REVIEW:   CBC  Recent Labs  Lab 08/20/18 0318  WBC 10.5  HGB 14.4  HCT 42.6  PLT 219    Chemistries  Recent Labs  Lab 08/19/18 1849  08/20/18 0951 08/20/18 1642  NA  --    < > 138  --   K  --    < > 3.0* 3.6  CL  --    < > 98  --   CO2  --    < > 29  --   GLUCOSE  --    < > 208*  --   BUN  --    < > 55*  --   CREATININE  --    < > 1.89*  --   CALCIUM  --    < > 9.1  --   MG 2.3  --   --   --    < > = values in this interval not displayed.    Microbiology Results   No results found for this or any previous visit (from the past 240 hour(s)).  RADIOLOGY:  Dg Chest 2 View  Result Date: 08/19/2018 CLINICAL DATA:  Pt to ED via POV with c/o his defibrillator shocking him x2 last night. Pt c/o some dizziness and not " feeling normal", history of cardiac arrest, CHF, hypertension, TIA EXAM: CHEST - 2 VIEW COMPARISON:  06/30/2018 FINDINGS: LEFT-sided AICD lead to the RIGHT ventricle, unchanged. Heart is enlarged. There is no pulmonary edema. No focal consolidations or pleural effusions. IMPRESSION: Stable cardiomegaly. Electronically Signed   By: Nolon Nations M.D.   On: 08/19/2018 15:45   US Venous Img Lower Unilateral Right  Result Date: 08/19/2018 CLINICAL DATA:  Swelling in pain in RIGHT lower extremity for 2 days EXAM: RIGHT LOWER EXTREMITY VENOUS DOPPLER ULTRASOUND TECHNIQUE: Gray-scale sonography with graded compression, as well as color Doppler and duplex ultrasound were performed to evaluate the lower extremity deep venous systems from the level of the common femoral vein and including the common femoral, femoral, profunda femoral, popliteal and calf veins including the posterior tibial, peroneal and gastrocnemius veins when visible. The superficial great saphenous vein was also interrogated. Spectral Doppler was utilized to evaluate flow at rest and with  distal augmentation maneuvers in the common femoral, femoral and popliteal veins. COMPARISON:  None FINDINGS: Contralateral Common Femoral Vein: Respiratory phasicity is normal  and symmetric with the symptomatic side. No evidence of thrombus. Normal compressibility. Common Femoral Vein: No evidence of thrombus. Normal compressibility, respiratory phasicity and response to augmentation. Saphenofemoral Junction: No evidence of thrombus. Normal compressibility and flow on color Doppler imaging. Profunda Femoral Vein: No evidence of thrombus. Normal compressibility and flow on color Doppler imaging. Femoral Vein: No evidence of thrombus. Normal compressibility, respiratory phasicity and response to augmentation. Popliteal Vein: No evidence of thrombus. Normal compressibility, respiratory phasicity and response to augmentation. Calf Veins: Limited assessment. Superficial Great Saphenous Vein: No evidence of thrombus. Normal compressibility. Venous Reflux:  None. Other Findings: Probable small complicated Baker cyst RIGHT popliteal fossa 3.0 x 0.9 x 1.6 cm. IMPRESSION: No evidence of deep venous thrombosis in the RIGHT lower extremity as above. Probable small complicated Baker's cyst. Electronically Signed   By: Lavonia Dana M.D.   On: 08/19/2018 18:12     Management plans discussed with the patient, family and they are in agreement.  CODE STATUS:     Code Status Orders  (From admission, onward)         Start     Ordered   08/19/18 2249  Full code  Continuous     08/19/18 2248        Code Status History    Date Active Date Inactive Code Status Order ID Comments User Context   06/30/2018 1757 07/06/2018 1647 Full Code [id number]  [name], MD Inpatient   06/10/2018 1606 06/11/2018 1537 Full Code [id number]  Thompson Grayer, MD Inpatient   12/23/2017 1153 12/31/2017 1729 Full Code [id number]  Erlene Quan, PA-C Inpatient   12/20/2017 1851 12/23/2017 1110 Full Code [id number]  [name], MD Inpatient    09/08/2017 0644 09/10/2017 2127 Full Code [id number]  Harrie Foreman, MD Inpatient   07/29/2017 1044 07/31/2017 2152 Full Code [id number]  Henreitta Leber, MD Inpatient   06/19/2017 0027 06/20/2017 2114 Full Code [id number]  Lance Coon, MD ED   05/20/2017 1226 05/21/2017 1759 Full Code [id number]  [name], MD Inpatient   06/22/2016 0430 06/22/2016 2150 Full Code [id number]  Quintella Baton, MD Inpatient   06/04/2016 1843 06/06/2016 1815 Full Code [id number]  [name], MD Inpatient      TOTAL TIME TAKING CARE OF THIS PATIENT: *40 minutes.    Fritzi Mandes M.D on 08/20/2018 at 5:55 PM  Between 7am to 6pm - Pager - [phone] After 6pm go to [email or web address] - password [email or web address] [facility]  Office  [phone]  CC: Primary care physician; Valerie Roys, DO

## 2018-08-20 NOTE — Progress Notes (Signed)
Pt was given D/C instructions along with follow up appt information. His vitals were stable and his IV removed without incident. He was released to his family.

## 2018-08-21 ENCOUNTER — Telehealth: Payer: Self-pay

## 2018-08-21 NOTE — Telephone Encounter (Signed)
I have made the 1st attempt to contact the patient or family member in charge, in order to follow up from recently being discharged from the hospital. I left a message on voicemail but I will make another attempt at a different time.   Direct call back - [phone] 

## 2018-08-22 NOTE — Telephone Encounter (Signed)
Transition Care Management Follow-up Telephone Call  How have you been since you were released from the hospital? "doing pretty good today, I have a cyst on my calf that is hurting but no other problems at this time"  Do you understand why you were in the hospital? yes  Do you have a copy of your discharge instructions Yes Do you understand the discharge instrcutions? yes  Where were you discharged to? Home  Do you have support at home? Yes    Items Reviewed:  Medications obtained Yes  Medications reviewed: Yes  Dietary changes reviewed: yes  Home Health? N/A  DME ordered at discharge obtained? NA  Medical supplies: NA    Functional Questionnaire:   Activities of Daily Living (ADLs):   He states they are independent in the following: ambulation, bathing and hygiene, feeding, continence, grooming, toileting, dressing and medication management States they require assistance with the following: none  Any transportation issues/concerns?: no  Any patient concerns? Yes, has a cyst on his calf for about 3 days- states it was here when he was in the hospital. No redness, not warm to the touch, some swelling just at site of cyst, no fevers. Denies wanting to come in for a sooner appt before the weekend. Informed pt to call before the end of the day if needed for further advice from a provider. Confirmed appt on Monday with Dr.Johnson.   Confirmed importance and date/time of follow-up visits scheduled with PCP: yes 08/25/2018 at 4pm with Dr.Johnson.   Confirm appointment scheduled with specialist? Yes  Confirmed with patient if condition begins to worsen call PCP or If it's emergency go to the ER.

## 2018-08-25 ENCOUNTER — Encounter: Payer: Self-pay | Admitting: Family Medicine

## 2018-08-25 ENCOUNTER — Ambulatory Visit (INDEPENDENT_AMBULATORY_CARE_PROVIDER_SITE_OTHER): Payer: Commercial Managed Care - PPO | Admitting: Family Medicine

## 2018-08-25 ENCOUNTER — Telehealth: Payer: Self-pay | Admitting: Internal Medicine

## 2018-08-25 VITALS — BP 94/64 | HR 85 | Temp 98.1°F | Wt 240.1 lb

## 2018-08-25 DIAGNOSIS — R5383 Other fatigue: Secondary | ICD-10-CM

## 2018-08-25 DIAGNOSIS — R231 Pallor: Secondary | ICD-10-CM

## 2018-08-25 DIAGNOSIS — Z4502 Encounter for adjustment and management of automatic implantable cardiac defibrillator: Secondary | ICD-10-CM | POA: Diagnosis not present

## 2018-08-25 DIAGNOSIS — M1A9XX0 Chronic gout, unspecified, without tophus (tophi): Secondary | ICD-10-CM

## 2018-08-25 DIAGNOSIS — R0683 Snoring: Secondary | ICD-10-CM

## 2018-08-25 DIAGNOSIS — E876 Hypokalemia: Secondary | ICD-10-CM

## 2018-08-25 NOTE — Progress Notes (Signed)
BP 94/64 (BP Location: Left Arm, Patient Position: Sitting, Cuff Size: Large)   Pulse 85   Temp 98.1 F (36.7 C)   Wt 240 lb 1 oz (108.9 kg)   SpO2 100%   BMI 31.67 kg/m    Subjective:    Patient ID: Stephen [name], male    DOB: [birth year], 52 y.o.   MRN: [id number]  HPI: Stephen [name] is a 52 y.o. male  Chief Complaint  Patient presents with  . Hospitalization Follow-up   Transition of Care Hospital Follow up.   Hospital/Facility: [facility] D/C Physician: Dr. Fritzi Mandes D/C Date: 08/20/18  Records Requested: 08/20/18  Records Received: 08/20/18 Records Reviewed: 08/25/18  Diagnoses on Discharge: Hypokalemia, ICD discharge  Date of interactive Contact within 48 hours of discharge: 08/21/18 Contact was through: phone  Date of 7 day or 14 day face-to-face visit: 08/25/18  within 7 days  Outpatient Encounter Medications as of 08/25/2018  Medication Sig Note  . acetaminophen (TYLENOL) 325 MG tablet Take 2 tablets (650 mg total) by mouth every 6 (six) hours as needed. (Patient taking differently: Take 325-650 mg by mouth every 6 (six) hours as needed for moderate pain. )   . albuterol (PROVENTIL HFA;VENTOLIN HFA) 108 (90 Base) MCG/ACT inhaler Inhale 2 puffs into the lungs every 4 (four) hours as needed for wheezing or shortness of breath.   Marland Kitchen amiodarone (PACERONE) 200 MG tablet Take 2 tablets (400 mg total) by mouth daily. 08/19/2018: Pt takes 1 tablet twice daily  . aspirin EC 81 MG EC tablet Take 1 tablet (81 mg total) by mouth daily.   Marland Kitchen atorvastatin (LIPITOR) 80 MG tablet Take 1 tablet (80 mg total) by mouth at bedtime.   . Camphor-Eucalyptus-Menthol (CHEST RUB) 4.8-1.2-2.6 % OINT Apply 1 application topically daily as needed (congestion).    . carvedilol (COREG) 3.125 MG tablet Take 1 tablet (3.125 mg total) by mouth 2 (two) times daily with a meal.   . clopidogrel (PLAVIX) 75 MG tablet Take 1 tablet (75 mg total) by mouth daily.   . colchicine 0.6 MG tablet Take 0.6 mg by  mouth daily as needed (gout flare).    . diclofenac sodium (VOLTAREN) 1 % GEL Apply 2 g topically 4 (four) times daily as needed (pain).    . fluticasone (FLONASE) 50 MCG/ACT nasal spray Place 2 sprays into both nostrils daily. (Patient taking differently: Place 2 sprays into both nostrils daily as needed for allergies. )   . fluticasone-salmeterol (ADVAIR HFA) 115-21 MCG/ACT inhaler Inhale 2 puffs into the lungs 2 (two) times daily.   Marland Kitchen guaiFENesin (MUCINEX) 600 MG 12 hr tablet Take 1 tablet (600 mg total) by mouth 2 (two) times daily as needed.   . Menthol, Topical Analgesic, (ICY HOT EX) Apply 1 application topically daily as needed (pain).   Marland Kitchen mexiletine (MEXITIL) 150 MG capsule Take 1 capsule (150 mg total) by mouth 2 (two) times daily. (Patient not taking: Reported on 08/19/2018) 08/19/2018: Pt advised he is not taking at the moment  . Multiple Vitamin (MULTIVITAMIN WITH MINERALS) TABS tablet Take 1 tablet by mouth daily.   . nitroGLYCERIN (NITROSTAT) 0.4 MG SL tablet Place 1 tablet (0.4 mg total) under the tongue every 5 (five) minutes as needed for chest pain.   . potassium chloride SA (K-DUR,KLOR-CON) 20 MEQ tablet Take 1 tablet (20 mEq total) by mouth 2 (two) times daily.   . sacubitril-valsartan (ENTRESTO) 24-26 MG Take 1 tablet by mouth 2 (two) times daily.   Marland Kitchen  Tiotropium Bromide Monohydrate (SPIRIVA RESPIMAT) 2.5 MCG/ACT AERS Inhale 1 puff into the lungs daily.   Marland Kitchen torsemide (DEMADEX) 20 MG tablet Take 2 tablets (40 mg total) by mouth daily.    No facility-administered encounter medications on file as of 08/25/2018.    HOSPITAL COURSE PER DISCHARGE SUMMARY: "TimothyWatkinsis a29 y.o.malewith a known history of AICD, CKD, V. fib, CHF with ejection fraction around15%. Patient presented to emergency room for defibrillator shock, twice last night. Patient denies any chest pain, but he feels tired.  1.Defibrillator shock.This could have been precipitated by low potassium and  dehydration. Will replace the potassium per protocol.The device was checked.  - consulted cardiology with dr Clayborn Bigness -tele showed PVC's  2.Hypokalemia.Potassium level is 2.5.Will replace potassium per protocol.Patient admits to being noncompliant with potassium supplement. -k now 3.6 -Mag is 2.3  -2. ARF/CKD3.Creatinine level is 2.03.---1.74--1.8 -Baseline seems to be around 1.5 in this patient. Continue to monitor kidney function closely and avoid nephrotoxic medications.   3.Nonischemic cardiomyopathy with ejection fraction around 15%.Continue medical treatment.  Overall doing well Will d/c with out pt cardiology f/u Pt was educated regarding importance of taking K at home"  Diagnostic Tests Reviewed:  CLINICAL DATA:  Pt to ED via POV with c/o his defibrillator shocking him x2 last night. Pt c/o some dizziness and not " feeling normal", history of cardiac arrest, CHF, hypertension, TIA  EXAM: CHEST - 2 VIEW  COMPARISON:  06/30/2018  FINDINGS: LEFT-sided AICD lead to the RIGHT ventricle, unchanged. Heart is enlarged. There is no pulmonary edema. No focal consolidations or pleural effusions.  IMPRESSION: Stable cardiomegaly.  CLINICAL DATA:  Pt to ED via POV with c/o his defibrillator shocking him x2 last night. Pt c/o some dizziness and not " feeling normal", history of cardiac arrest, CHF, hypertension, TIA  EXAM: CHEST - 2 VIEW  COMPARISON:  06/30/2018  FINDINGS: LEFT-sided AICD lead to the RIGHT ventricle, unchanged. Heart is enlarged. There is no pulmonary edema. No focal consolidations or pleural effusions.  IMPRESSION: Stable cardiomegaly.  Disposition: Home  Consults: Cardiology  Discharge Instructions: Follow up with cardiology and here  Disease/illness Education: Discussed today  Home Health/Community Services Discussions/Referrals: N/A  Establishment or re-establishment of referral orders for community  resources: N/A  Discussion with other health care providers: None  Assessment and Support of treatment regimen adherence: Excellent  Appointments Coordinated with:  Patient  Education for self-management, independent living, and ADLs:  Discussed today  Feels like his gout has been acting up again. His R leg has been swelling. He has been watching his diet. He has been taking 1 of his torsemides rather than 2. Has been taking his potassium pills.   ???SLEEP APNEA Sleep apnea status: unknown Duration: months Satisfied with current treatment?:  no CPAP use:  No Wakes feeling refreshed:  no Daytime hypersomnolence:  yes Fatigue:  yes Insomnia:  no Good sleep hygiene:  yes Difficulty falling asleep:  no Difficulty staying asleep:  yes Snoring bothers bed partner:  yes Observed apnea by bed partner: no Obesity:  yes Hypertension: yes  Pulmonary hypertension:  yes Coronary artery disease:  yes  Relevant past medical, surgical, family and social history reviewed and updated as indicated. Interim medical history since our last visit reviewed. Allergies and medications reviewed and updated.  Review of Systems  Constitutional: Negative.   Respiratory: Negative.   Cardiovascular: Positive for leg swelling. Negative for chest pain and palpitations.  Gastrointestinal: Negative.   Endocrine: Negative.   Musculoskeletal: Positive for gait  problem and myalgias. Negative for arthralgias, back pain, joint swelling, neck pain and neck stiffness.  Skin: Negative.   Neurological: Positive for dizziness. Negative for tremors, seizures, syncope, facial asymmetry, speech difficulty, weakness, light-headedness, numbness and headaches.  Psychiatric/Behavioral: Negative.     Per HPI unless specifically indicated above     Objective:    BP 94/64 (BP Location: Left Arm, Patient Position: Sitting, Cuff Size: Large)   Pulse 85   Temp 98.1 F (36.7 C)   Wt 240 lb 1 oz (108.9 kg)   SpO2 100%    BMI 31.67 kg/m   Wt Readings from Last 3 Encounters:  08/25/18 240 lb 1 oz (108.9 kg)  08/20/18 239 lb 6.7 oz (108.6 kg)  08/12/18 239 lb 8 oz (108.6 kg)    Physical Exam  Constitutional: He is oriented to person, place, and time. He appears well-developed and well-nourished. No distress.  HENT:  Head: Normocephalic and atraumatic.  Right Ear: Hearing normal.  Left Ear: Hearing normal.  Nose: Nose normal.  Eyes: Conjunctivae and lids are normal. Right eye exhibits no discharge. Left eye exhibits no discharge. No scleral icterus.  Cardiovascular: Normal rate, regular rhythm, normal heart sounds and intact distal pulses. Exam reveals no gallop and no friction rub.  No murmur heard. Pulmonary/Chest: Effort normal and breath sounds normal. No stridor. No respiratory distress. He has no wheezes. He has no rales. He exhibits no tenderness.  Musculoskeletal: Normal range of motion. He exhibits edema (R leg only). He exhibits no tenderness or deformity.  Neurological: He is alert and oriented to person, place, and time.  Skin: Skin is warm, dry and intact. Capillary refill takes less than 2 seconds. No rash noted. He is not diaphoretic. No erythema. No pallor.  Psychiatric: He has a normal mood and affect. His speech is normal and behavior is normal. Judgment and thought content normal. Cognition and memory are normal.  Nursing note and vitals reviewed.   Results for orders placed or performed during the hospital encounter of 35/36/14  Basic metabolic panel  Result Value Ref Range   Sodium 137 135 - 145 mmol/L   Potassium 2.5 (LL) 3.5 - 5.1 mmol/L   Chloride 93 (L) 98 - 111 mmol/L   CO2 30 22 - 32 mmol/L   Glucose, Bld 140 (H) 70 - 99 mg/dL   BUN 63 (H) 6 - 20 mg/dL   Creatinine, Ser 2.03 (H) 0.61 - 1.24 mg/dL   Calcium 9.8 8.9 - 10.3 mg/dL   GFR calc non Af Amer 36 (L) >60 mL/min   GFR calc Af Amer 41 (L) >60 mL/min   Anion gap 14 5 - 15  CBC  Result Value Ref Range   WBC 11.3 (H)  3.8 - 10.6 K/uL   RBC 5.32 4.40 - 5.90 MIL/uL   Hemoglobin 15.8 13.0 - 18.0 g/dL   HCT 46.8 40.0 - 52.0 %   MCV 88.0 80.0 - 100.0 fL   MCH 29.7 26.0 - 34.0 pg   MCHC 33.7 32.0 - 36.0 g/dL   RDW 15.7 (H) 11.5 - 14.5 %   Platelets 234 150 - 440 K/uL  Troponin I  Result Value Ref Range   Troponin I 0.14 (HH) <0.03 ng/mL  Magnesium  Result Value Ref Range   Magnesium 2.3 1.7 - 2.4 mg/dL  Potassium  Result Value Ref Range   Potassium 2.8 (L) 3.5 - 5.1 mmol/L  Basic metabolic panel  Result Value Ref Range   Sodium 139 135 -  145 mmol/L   Potassium 3.0 (L) 3.5 - 5.1 mmol/L   Chloride 99 98 - 111 mmol/L   CO2 32 22 - 32 mmol/L   Glucose, Bld 126 (H) 70 - 99 mg/dL   BUN 61 (H) 6 - 20 mg/dL   Creatinine, Ser 1.74 (H) 0.61 - 1.24 mg/dL   Calcium 9.2 8.9 - 10.3 mg/dL   GFR calc non Af Amer 43 (L) >60 mL/min   GFR calc Af Amer 50 (L) >60 mL/min   Anion gap 8 5 - 15  CBC  Result Value Ref Range   WBC 10.5 3.8 - 10.6 K/uL   RBC 4.88 4.40 - 5.90 MIL/uL   Hemoglobin 14.4 13.0 - 18.0 g/dL   HCT 42.6 40.0 - 52.0 %   MCV 87.4 80.0 - 100.0 fL   MCH 29.6 26.0 - 34.0 pg   MCHC 33.9 32.0 - 36.0 g/dL   RDW 16.0 (H) 11.5 - 14.5 %   Platelets 219 150 - 440 K/uL  Glucose, capillary  Result Value Ref Range   Glucose-Capillary 137 (H) 70 - 99 mg/dL  Basic metabolic panel  Result Value Ref Range   Sodium 138 135 - 145 mmol/L   Potassium 3.0 (L) 3.5 - 5.1 mmol/L   Chloride 98 98 - 111 mmol/L   CO2 29 22 - 32 mmol/L   Glucose, Bld 208 (H) 70 - 99 mg/dL   BUN 55 (H) 6 - 20 mg/dL   Creatinine, Ser 1.89 (H) 0.61 - 1.24 mg/dL   Calcium 9.1 8.9 - 10.3 mg/dL   GFR calc non Af Amer 39 (L) >60 mL/min   GFR calc Af Amer 45 (L) >60 mL/min   Anion gap 11 5 - 15  Potassium  Result Value Ref Range   Potassium 3.6 3.5 - 5.1 mmol/L      Assessment & Plan:   Problem List Items Addressed This Visit      Other   Gout    Not on uloric due to cardiac concerns. Thinks that he had a gout flare. Will  check in uric acid and see if we need to start renally dosed allopurinol. Await results.       Relevant Orders   Uric acid   Comprehensive metabolic panel   Hypokalemia - Primary    Rechecking levels today. Await results. Call with any concerns.       Relevant Orders   Comprehensive metabolic panel    Other Visit Diagnoses    Fatigue, unspecified type       Needs sleep study. Ordered today. Call with any concerns.    Relevant Orders   CBC With Differential/Platelet   Ambulatory referral to Sleep Studies   Pallor       CBC normal. Call with any concerns.    Relevant Orders   CBC With Differential/Platelet   Snoring       Needs sleep study. Ordered today. Call with any concerns.    Relevant Orders   Ambulatory referral to Sleep Studies   ICD (implantable cardioverter-defibrillator) discharge       None since coming out of the hospital. Seeing cardiology on Thursday.       Follow up plan: Return 1-2 months, for follow up.

## 2018-08-25 NOTE — Assessment & Plan Note (Signed)
Not on uloric due to cardiac concerns. Thinks that he had a gout flare. Will check in uric acid and see if we need to start renally dosed allopurinol. Await results.

## 2018-08-25 NOTE — Telephone Encounter (Signed)
Received records request Disability determination Services , forwarded to [facility] for processing.

## 2018-08-25 NOTE — Assessment & Plan Note (Signed)
Rechecking levels today. Await results. Call with any concerns.  

## 2018-08-26 ENCOUNTER — Telehealth: Payer: Self-pay | Admitting: Family Medicine

## 2018-08-26 LAB — URIC ACID: URIC ACID: 12.7 mg/dL — AB (ref 3.7–8.6)

## 2018-08-26 LAB — CBC WITH DIFFERENTIAL/PLATELET
HEMATOCRIT: 41.9 % (ref 37.5–51.0)
Hemoglobin: 13.9 g/dL (ref 13.0–17.7)
LYMPHS ABS: 2.4 10*3/uL (ref 0.7–3.1)
LYMPHS: 25 %
MCH: 29.3 pg (ref 26.6–33.0)
MCHC: 33.2 g/dL (ref 31.5–35.7)
MCV: 88 fL (ref 79–97)
MID (ABSOLUTE): 1.3 10*3/uL (ref 0.1–1.6)
MID: 14 %
NEUTROS PCT: 61 %
Neutrophils Absolute: 5.6 10*3/uL (ref 1.4–7.0)
PLATELETS: 298 10*3/uL (ref 150–450)
RBC: 4.75 x10E6/uL (ref 4.14–5.80)
RDW: 16.3 % — ABNORMAL HIGH (ref 12.3–15.4)
WBC: 9.3 10*3/uL (ref 3.4–10.8)

## 2018-08-26 LAB — COMPREHENSIVE METABOLIC PANEL
ALT: 24 IU/L (ref 0–44)
AST: 21 IU/L (ref 0–40)
Albumin/Globulin Ratio: 1.2 (ref 1.2–2.2)
Albumin: 3.8 g/dL (ref 3.5–5.5)
Alkaline Phosphatase: 111 IU/L (ref 39–117)
BUN/Creatinine Ratio: 21 — ABNORMAL HIGH (ref 9–20)
BUN: 37 mg/dL — AB (ref 6–24)
Bilirubin Total: 1 mg/dL (ref 0.0–1.2)
CALCIUM: 9.5 mg/dL (ref 8.7–10.2)
CO2: 22 mmol/L (ref 20–29)
Chloride: 96 mmol/L (ref 96–106)
Creatinine, Ser: 1.75 mg/dL — ABNORMAL HIGH (ref 0.76–1.27)
GFR, EST AFRICAN AMERICAN: 50 mL/min/{1.73_m2} — AB (ref >59)
GFR, EST NON AFRICAN AMERICAN: 43 mL/min/{1.73_m2} — AB (ref >59)
GLUCOSE: 92 mg/dL (ref 65–99)
Globulin, Total: 3.3 g/dL (ref 1.5–4.5)
POTASSIUM: 4.2 mmol/L (ref 3.5–5.2)
Sodium: 137 mmol/L (ref 134–144)
TOTAL PROTEIN: 7.1 g/dL (ref 6.0–8.5)

## 2018-08-26 NOTE — Telephone Encounter (Signed)
Copied from [place] [id number]. Topic: Referral - Question >> Aug 26, 2018 12:07 PM Synthia Innocent wrote: Reason for CRM: [facility] called patient to schedule sleep study, they are located in [place], [place] and patient is requesting someone local in [place]. Able to referral elsewhere?

## 2018-08-26 NOTE — Telephone Encounter (Signed)
[facility] in [place]. Please refer him there.

## 2018-08-26 NOTE — Telephone Encounter (Signed)
Patient notified

## 2018-09-02 ENCOUNTER — Telehealth (HOSPITAL_COMMUNITY): Payer: Self-pay | Admitting: Surgery

## 2018-09-02 NOTE — Telephone Encounter (Signed)
I called patient to schedule a follow-up appt with the [facility].  I left a message and requested a call back when he is available.

## 2018-09-03 ENCOUNTER — Telehealth: Payer: Self-pay | Admitting: Family Medicine

## 2018-09-03 MED ORDER — COLCHICINE 0.6 MG PO TABS: 0.6000 mg | ORAL_TABLET | Freq: Every day | ORAL | 1 refills | Status: DC | PRN

## 2018-09-03 MED ORDER — ALLOPURINOL 100 MG PO TABS: 100.0000 mg | ORAL_TABLET | Freq: Every day | ORAL | 6 refills | Status: DC

## 2018-09-03 NOTE — Telephone Encounter (Signed)
Patient notified, instructions given, understood and he will go get his prescriptions.

## 2018-09-03 NOTE — Telephone Encounter (Signed)
LVM for patient to return phone call.  

## 2018-09-03 NOTE — Telephone Encounter (Signed)
Please let him know that his gout levels are still high, so I want to get him started on the allopruinol to keep his gout under better control. I'm going to start him on 100mg  daily -but I want him to take the colchicine for the first 3 days he's on it to avoid a flare. Medicine is at his pharmacy and we'll recheck his uric acid when he comes back in.

## 2018-09-04 ENCOUNTER — Telehealth (HOSPITAL_COMMUNITY): Payer: Self-pay | Admitting: Internal Medicine

## 2018-09-04 NOTE — Telephone Encounter (Signed)
Called and left message for patient to call back to reschedule appt with DB.  Appts available 09/22/18 @9 :40, 09/24/18 @3 :20.  If pt cannot do either of those, then pt can come in October at a 9:40 or 3:20 slot on DB's schedule per [facility].

## 2018-09-08 NOTE — Telephone Encounter (Signed)
Tried to reach patient again.  Left message asking him to contact our office so we can get his appt rescheduled with DB

## 2018-09-12 ENCOUNTER — Encounter (HOSPITAL_COMMUNITY): Payer: Self-pay | Admitting: Internal Medicine

## 2018-09-17 ENCOUNTER — Encounter (HOSPITAL_COMMUNITY): Payer: Self-pay

## 2018-09-17 ENCOUNTER — Encounter: Payer: Commercial Managed Care - PPO | Admitting: Family Medicine

## 2018-09-17 ENCOUNTER — Encounter (HOSPITAL_COMMUNITY): Payer: Self-pay | Admitting: *Deleted

## 2018-09-17 NOTE — Progress Notes (Signed)
Received medical records request from [facility] , CASE [id number].   Requested records faxed today to:  [phone].  Original request will be scanned to patient's electronic medical record.

## 2018-09-17 NOTE — Progress Notes (Signed)
LVM for patient to call back to confirm start date of disability. Will hold Disability forms in disability folder at [facility] until he calls back to address. Also left on VM that we are still trying to reschedule his appt with Dr. Haroldine Laws.  Renee Pain, RN

## 2018-09-19 ENCOUNTER — Ambulatory Visit (INDEPENDENT_AMBULATORY_CARE_PROVIDER_SITE_OTHER): Payer: Commercial Managed Care - PPO | Admitting: Family Medicine

## 2018-09-19 ENCOUNTER — Encounter: Payer: Self-pay | Admitting: Family Medicine

## 2018-09-19 VITALS — BP 126/91 | HR 73 | Temp 98.2°F | Ht 73.0 in | Wt 233.1 lb

## 2018-09-19 DIAGNOSIS — Z23 Encounter for immunization: Secondary | ICD-10-CM | POA: Diagnosis not present

## 2018-09-19 DIAGNOSIS — M1A9XX0 Chronic gout, unspecified, without tophus (tophi): Secondary | ICD-10-CM | POA: Diagnosis not present

## 2018-09-19 DIAGNOSIS — E876 Hypokalemia: Secondary | ICD-10-CM

## 2018-09-19 DIAGNOSIS — J069 Acute upper respiratory infection, unspecified: Secondary | ICD-10-CM

## 2018-09-19 DIAGNOSIS — E782 Mixed hyperlipidemia: Secondary | ICD-10-CM | POA: Diagnosis not present

## 2018-09-19 DIAGNOSIS — J449 Chronic obstructive pulmonary disease, unspecified: Secondary | ICD-10-CM

## 2018-09-19 MED ORDER — ALBUTEROL SULFATE HFA 108 (90 BASE) MCG/ACT IN AERS: 2.0000 | INHALATION_SPRAY | RESPIRATORY_TRACT | 0 refills | Status: DC | PRN

## 2018-09-19 NOTE — Assessment & Plan Note (Signed)
Rechecking levels today with goal LDL of <70. Await results. Adjust medications as needed. Call with any concerns.

## 2018-09-19 NOTE — Assessment & Plan Note (Signed)
Rechecking levels on allopurinol today. Call with any concerns. Continue to monitor. Await results.

## 2018-09-19 NOTE — Progress Notes (Signed)
BP (!) 126/91 (BP Location: Left Arm, Patient Position: Sitting, Cuff Size: Normal)   Pulse 73   Temp 98.2 F (36.8 C)   Ht 6\' 1"  (1.854 m)   Wt 233 lb 2 oz (105.7 kg)   SpO2 100%   BMI 30.76 kg/m    Subjective:    Patient ID: Stephen [name], male    DOB: 1966-01-12, 52 y.o.   MRN: [id number]  HPI: Stephen [name] is a 52 y.o. male  Chief Complaint  Patient presents with  . Gout  . Hyperlipidemia   Would like his potassium checked.   Taken off uloric for cardiac risks. Uric acid was elevated at 12.7 last time. Started on allopurinol. Here today for recheck on levels. Has been doing well. No flares. Feeling well.   HYPERLIPIDEMIA- atorvastatin increased to 80mg  as cholesterol still out of control. Rechecking today. Hyperlipidemia status: stable Satisfied with current treatment?  yes Side effects:  no Medication compliance: excellent compliance Past cholesterol meds: atorvastatin Supplements: none Aspirin:  no The ASCVD Risk score Mikey Bussing DC Jr., et al., 2013) failed to calculate for the following reasons:   The patient has a prior MI or stroke diagnosis Chest pain:  yes Coronary artery disease:  yes  UPPER RESPIRATORY TRACT INFECTION Duration: 1 week Worst symptom: cough Fever: no Cough: yes Shortness of breath: yes Wheezing: no Chest pain: yes, with cough Chest tightness: yes Chest congestion: yes Nasal congestion: yes Runny nose: yes Post nasal drip: yes Sneezing: no Sore throat: no Swollen glands: no Sinus pressure: yes Headache: no Face pain: no Toothache: no Ear pain: no  Ear pressure: no  Eyes red/itching:no Eye drainage/crusting: no  Vomiting: no Rash: no Fatigue: no Sick contacts: no Context: stable Recurrent sinusitis: no Relief with OTC cold/cough medications: no  Treatments attempted: flonase   Relevant past medical, surgical, family and social history reviewed and updated as indicated. Interim medical history since our last visit  reviewed. Allergies and medications reviewed and updated.  Review of Systems  Constitutional: Negative.   HENT: Positive for congestion, postnasal drip, rhinorrhea, sinus pressure and sneezing. Negative for dental problem, drooling, ear discharge, ear pain, facial swelling, hearing loss, mouth sores, nosebleeds, sinus pain, sore throat, tinnitus, trouble swallowing and voice change.   Respiratory: Positive for cough and shortness of breath. Negative for apnea, choking, chest tightness, wheezing and stridor.   Cardiovascular: Negative.   Musculoskeletal: Negative.   Psychiatric/Behavioral: Negative.     Per HPI unless specifically indicated above     Objective:    BP (!) 126/91 (BP Location: Left Arm, Patient Position: Sitting, Cuff Size: Normal)   Pulse 73   Temp 98.2 F (36.8 C)   Ht 6\' 1"  (1.854 m)   Wt 233 lb 2 oz (105.7 kg)   SpO2 100%   BMI 30.76 kg/m   Wt Readings from Last 3 Encounters:  09/19/18 233 lb 2 oz (105.7 kg)  08/25/18 240 lb 1 oz (108.9 kg)  08/20/18 239 lb 6.7 oz (108.6 kg)    Physical Exam  Constitutional: He is oriented to person, place, and time. He appears well-developed and well-nourished. No distress.  HENT:  Head: Normocephalic and atraumatic.  Right Ear: Hearing and external ear normal.  Left Ear: Hearing and external ear normal.  Nose: Nose normal.  Mouth/Throat: Oropharynx is clear and moist. No oropharyngeal exudate.  Eyes: Pupils are equal, round, and reactive to light. Conjunctivae, EOM and lids are normal. Right eye exhibits no discharge. Left  eye exhibits no discharge. No scleral icterus.  Neck: Normal range of motion. Neck supple. No JVD present. No tracheal deviation present. No thyromegaly present.  Cardiovascular: Normal rate, regular rhythm, normal heart sounds and intact distal pulses. Exam reveals no gallop and no friction rub.  No murmur heard. Pulmonary/Chest: Effort normal and breath sounds normal. No stridor. No respiratory  distress. He has no wheezes. He has no rales. He exhibits no tenderness.  Musculoskeletal: Normal range of motion.  Lymphadenopathy:    He has no cervical adenopathy.  Neurological: He is alert and oriented to person, place, and time.  Skin: Skin is warm, dry and intact. Capillary refill takes less than 2 seconds. No rash noted. He is not diaphoretic. No erythema. No pallor.  Psychiatric: He has a normal mood and affect. His speech is normal and behavior is normal. Judgment and thought content normal. Cognition and memory are normal.  Nursing note and vitals reviewed.   Results for orders placed or performed in visit on 08/25/18  Uric acid  Result Value Ref Range   Uric Acid 12.7 (H) 3.7 - 8.6 mg/dL  Comprehensive metabolic panel  Result Value Ref Range   Glucose 92 65 - 99 mg/dL   BUN 37 (H) 6 - 24 mg/dL   Creatinine, Ser 1.75 (H) 0.76 - 1.27 mg/dL   GFR calc non Af Amer 43 (L) >59 mL/min/1.73   GFR calc Af Amer 50 (L) >59 mL/min/1.73   BUN/Creatinine Ratio 21 (H) 9 - 20   Sodium 137 134 - 144 mmol/L   Potassium 4.2 3.5 - 5.2 mmol/L   Chloride 96 96 - 106 mmol/L   CO2 22 20 - 29 mmol/L   Calcium 9.5 8.7 - 10.2 mg/dL   Total Protein 7.1 6.0 - 8.5 g/dL   Albumin 3.8 3.5 - 5.5 g/dL   Globulin, Total 3.3 1.5 - 4.5 g/dL   Albumin/Globulin Ratio 1.2 1.2 - 2.2   Bilirubin Total 1.0 0.0 - 1.2 mg/dL   Alkaline Phosphatase 111 39 - 117 IU/L   AST 21 0 - 40 IU/L   ALT 24 0 - 44 IU/L  CBC With Differential/Platelet  Result Value Ref Range   WBC 9.3 3.4 - 10.8 x10E3/uL   RBC 4.75 4.14 - 5.80 x10E6/uL   Hemoglobin 13.9 13.0 - 17.7 g/dL   Hematocrit 41.9 37.5 - 51.0 %   MCV 88 79 - 97 fL   MCH 29.3 26.6 - 33.0 pg   MCHC 33.2 31.5 - 35.7 g/dL   RDW 16.3 (H) 12.3 - 15.4 %   Platelets 298 150 - 450 x10E3/uL   Neutrophils 61 Not Estab. %   Lymphs 25 Not Estab. %   MID 14 Not Estab. %   Neutrophils Absolute 5.6 1.4 - 7.0 x10E3/uL   Lymphocytes Absolute 2.4 0.7 - 3.1 x10E3/uL   MID  (Absolute) 1.3 0.1 - 1.6 X10E3/uL      Assessment & Plan:   Problem List Items Addressed This Visit      Respiratory   COPD (chronic obstructive pulmonary disease) (HCC) (Chronic)   Relevant Medications   albuterol (PROVENTIL HFA;VENTOLIN HFA) 108 (90 Base) MCG/ACT inhaler     Other   Hyperlipidemia    Rechecking levels today with goal LDL of <70. Await results. Adjust medications as needed. Call with any concerns.       Relevant Medications   metolazone (ZAROXOLYN) 2.5 MG tablet   furosemide (LASIX) 40 MG tablet   Other Relevant Orders  Lipid Panel w/o Chol/HDL Ratio   Gout - Primary    Rechecking levels on allopurinol today. Call with any concerns. Continue to monitor. Await results.       Relevant Orders   Comprehensive metabolic panel   Uric acid   Hypokalemia    Rechecking levels today. Await results. Call with any concerns.        Other Visit Diagnoses    Immunization due       Flu shot given today.   Relevant Orders   Flu Vaccine QUAD 6+ mos PF IM (Fluarix Quad PF) (Completed)   Viral upper respiratory tract infection       Will treat with albuterol. No sign of bacterial infection. If not better by next week- will send him in abx.       Follow up plan: Return in about 4 weeks (around 10/17/2018).

## 2018-09-19 NOTE — Assessment & Plan Note (Signed)
Rechecking levels today. Await results. Call with any concerns.  

## 2018-09-20 LAB — LIPID PANEL W/O CHOL/HDL RATIO
Cholesterol, Total: 184 mg/dL (ref 100–199)
HDL: 42 mg/dL (ref >39)
LDL Calculated: 123 mg/dL — ABNORMAL HIGH (ref 0–99)
TRIGLYCERIDES: 94 mg/dL (ref 0–149)
VLDL Cholesterol Cal: 19 mg/dL (ref 5–40)

## 2018-09-20 LAB — COMPREHENSIVE METABOLIC PANEL
ALK PHOS: 103 IU/L (ref 39–117)
ALT: 12 IU/L (ref 0–44)
AST: 9 IU/L (ref 0–40)
Albumin/Globulin Ratio: 1.1 — ABNORMAL LOW (ref 1.2–2.2)
Albumin: 4 g/dL (ref 3.5–5.5)
BILIRUBIN TOTAL: 0.8 mg/dL (ref 0.0–1.2)
BUN / CREAT RATIO: 14 (ref 9–20)
BUN: 28 mg/dL — AB (ref 6–24)
CHLORIDE: 99 mmol/L (ref 96–106)
CO2: 23 mmol/L (ref 20–29)
Calcium: 9.9 mg/dL (ref 8.7–10.2)
Creatinine, Ser: 2.04 mg/dL — ABNORMAL HIGH (ref 0.76–1.27)
GFR calc Af Amer: 42 mL/min/{1.73_m2} — ABNORMAL LOW (ref >59)
GFR calc non Af Amer: 36 mL/min/{1.73_m2} — ABNORMAL LOW (ref >59)
GLUCOSE: 101 mg/dL — AB (ref 65–99)
Globulin, Total: 3.7 g/dL (ref 1.5–4.5)
POTASSIUM: 3.8 mmol/L (ref 3.5–5.2)
Sodium: 142 mmol/L (ref 134–144)
Total Protein: 7.7 g/dL (ref 6.0–8.5)

## 2018-09-20 LAB — URIC ACID: URIC ACID: 12.2 mg/dL — AB (ref 3.7–8.6)

## 2018-09-22 ENCOUNTER — Telehealth: Payer: Self-pay | Admitting: Family Medicine

## 2018-09-22 DIAGNOSIS — M1A9XX0 Chronic gout, unspecified, without tophus (tophi): Secondary | ICD-10-CM

## 2018-09-22 DIAGNOSIS — N289 Disorder of kidney and ureter, unspecified: Secondary | ICD-10-CM

## 2018-09-22 NOTE — Telephone Encounter (Signed)
This form has been filled out and faxed.

## 2018-09-22 NOTE — Telephone Encounter (Signed)
Has been taking his medicine. Will check labs again in 1 week- will increase fluids. Continue to take allopurinol.

## 2018-09-22 NOTE — Telephone Encounter (Signed)
Copied from [place] [id number]. Topic: Quick Communication - See Telephone Encounter >> Sep 22, 2018 12:17 PM Blase Mess A wrote: CRM for notification. See Telephone encounter for: 09/22/18. Patients sister is calling regarding disabilty forms that where faxed a couple of weeks ago.  Medical release form was signed 09/19/18 by the patient.  Please advise Patient Sister Nancie Neas was checking on the status Please advised. [phone]

## 2018-09-22 NOTE — Telephone Encounter (Signed)
Patient notified

## 2018-09-24 ENCOUNTER — Telehealth: Payer: Self-pay

## 2018-09-24 LAB — CUP PACEART INCLINIC DEVICE CHECK
Battery Remaining Longevity: 134 mo
Date Time Interrogation Session: 20190813131359
HighPow Impedance: 83 Ohm
Implantable Lead Location: 753860
Implantable Pulse Generator Implant Date: 20181231
Lead Channel Impedance Value: 456 Ohm
Lead Channel Pacing Threshold Amplitude: 1.25 V
Lead Channel Pacing Threshold Pulse Width: 1 ms
Lead Channel Setting Pacing Amplitude: 2.5 V
Lead Channel Setting Pacing Pulse Width: 1 ms
Lead Channel Setting Sensing Sensitivity: 0.3 mV
MDC IDC LEAD IMPLANT DT: 20181231
MDC IDC MSMT BATTERY VOLTAGE: 3.02 V
MDC IDC MSMT LEADCHNL RV IMPEDANCE VALUE: 361 Ohm
MDC IDC MSMT LEADCHNL RV SENSING INTR AMPL: 10.25 mV
MDC IDC STAT BRADY RV PERCENT PACED: 0.01 %

## 2018-09-24 NOTE — Telephone Encounter (Signed)
Called pt and let him know that his parking placard form is ready to be picked up. Pt verbalized understanding.

## 2018-09-25 ENCOUNTER — Ambulatory Visit: Payer: Commercial Managed Care - PPO | Admitting: Family Medicine

## 2018-10-08 ENCOUNTER — Ambulatory Visit (INDEPENDENT_AMBULATORY_CARE_PROVIDER_SITE_OTHER): Payer: Commercial Managed Care - PPO | Admitting: *Deleted

## 2018-10-08 DIAGNOSIS — I428 Other cardiomyopathies: Secondary | ICD-10-CM

## 2018-10-09 ENCOUNTER — Telehealth: Payer: Self-pay | Admitting: Cardiology

## 2018-10-09 NOTE — Progress Notes (Signed)
Remote ICD transmission.   

## 2018-10-09 NOTE — Telephone Encounter (Signed)
Patient sister calling wanting pt remote transmission results from 10-08-2018. You can call her at [phone]. DPR is on file.

## 2018-10-09 NOTE — Telephone Encounter (Signed)
Notified sister that appt yesterday was for a remote ICD transmission. Made her aware of results, next appt is 11/18/18 in [place] with [name]. Reiterated driving restrictions due to episode in August.

## 2018-10-17 ENCOUNTER — Ambulatory Visit
Admission: RE | Admit: 2018-10-17 | Discharge: 2018-10-17 | Disposition: A | Discharge status: Home / Self Care | Payer: Commercial Managed Care - PPO | Source: Ambulatory Visit | Attending: Family Medicine | Admitting: Family Medicine

## 2018-10-17 ENCOUNTER — Ambulatory Visit (INDEPENDENT_AMBULATORY_CARE_PROVIDER_SITE_OTHER): Payer: Commercial Managed Care - PPO | Admitting: Family Medicine

## 2018-10-17 ENCOUNTER — Encounter: Payer: Self-pay | Admitting: Family Medicine

## 2018-10-17 ENCOUNTER — Other Ambulatory Visit: Payer: Self-pay

## 2018-10-17 VITALS — BP 114/81 | HR 90 | Temp 98.2°F | Ht 73.0 in | Wt 236.0 lb

## 2018-10-17 DIAGNOSIS — M1A9XX0 Chronic gout, unspecified, without tophus (tophi): Secondary | ICD-10-CM

## 2018-10-17 DIAGNOSIS — M4802 Spinal stenosis, cervical region: Secondary | ICD-10-CM | POA: Insufficient documentation

## 2018-10-17 DIAGNOSIS — M25462 Effusion, left knee: Secondary | ICD-10-CM

## 2018-10-17 DIAGNOSIS — G44229 Chronic tension-type headache, not intractable: Secondary | ICD-10-CM

## 2018-10-17 DIAGNOSIS — M50322 Other cervical disc degeneration at C5-C6 level: Secondary | ICD-10-CM | POA: Diagnosis not present

## 2018-10-17 NOTE — Assessment & Plan Note (Signed)
Rechecking uric acid today. Will adjust medicines as needed. Call with any concerns.

## 2018-10-17 NOTE — Progress Notes (Signed)
BP 114/81   Pulse 90   Temp 98.2 F (36.8 C) (Oral)   Ht 6\' 1"  (1.854 m)   Wt 236 lb (107 kg)   SpO2 97%   BMI 31.14 kg/m    Subjective:    Patient ID: Stephen [name], male    DOB: 1966/05/18, 52 y.o.   MRN: [id number]  HPI: Stephen [name] is a 52 y.o. male  Chief Complaint  Patient presents with  . Knee Pain    bilateral/ ppt states left knee has fluid build up   KNEE PAIN Duration: 1.5 weeks Involved knee: bilateral L>R Mechanism of injury: unknown Location:diffuse Onset: gradual Severity: moderate  Quality:  Aching and sore Frequency: constant Radiation: no Aggravating factors: weight bearing, walking, running, stairs, bending and movement  Alleviating factors: rest  Status: better Treatments attempted: rest  Relief with NSAIDs?:  no Weakness with weight bearing or walking: no Sensation of giving way: no Locking: no Popping: no Bruising: no Swelling: yes Redness: yes Paresthesias/decreased sensation: no Fevers: no  Had a GI bug about a week ago when he had a lot of diarrhea. He is concerned about his potassium.   Has been having headaches all over his head. Nothing makes it particularly better or worse. He is otherwise doing well with no other concerns or complaints at that time.   Relevant past medical, surgical, family and social history reviewed and updated as indicated. Interim medical history since our last visit reviewed. Allergies and medications reviewed and updated.  Review of Systems  Constitutional: Negative.   Respiratory: Positive for shortness of breath. Negative for apnea, cough, choking, chest tightness, wheezing and stridor.   Cardiovascular: Negative.   Gastrointestinal: Positive for diarrhea. Negative for abdominal distention, abdominal pain, anal bleeding, blood in stool, constipation, nausea, rectal pain and vomiting.  Musculoskeletal: Positive for arthralgias, gait problem and joint swelling. Negative for back pain, myalgias,  neck pain and neck stiffness.  Skin: Negative.   Hematological: Negative.   Psychiatric/Behavioral: Negative.     Per HPI unless specifically indicated above     Objective:    BP 114/81   Pulse 90   Temp 98.2 F (36.8 C) (Oral)   Ht 6\' 1"  (1.854 m)   Wt 236 lb (107 kg)   SpO2 97%   BMI 31.14 kg/m   Wt Readings from Last 3 Encounters:  10/17/18 236 lb (107 kg)  09/19/18 233 lb 2 oz (105.7 kg)  08/25/18 240 lb 1 oz (108.9 kg)    Physical Exam  Constitutional: He is oriented to person, place, and time. He appears well-developed and well-nourished. No distress.  HENT:  Head: Normocephalic and atraumatic.  Right Ear: Hearing normal.  Left Ear: Hearing normal.  Nose: Nose normal.  Eyes: Conjunctivae and lids are normal. Right eye exhibits no discharge. Left eye exhibits no discharge. No scleral icterus.  Cardiovascular: Normal rate, regular rhythm and intact distal pulses. Exam reveals no gallop and no friction rub.  Murmur heard. Pulmonary/Chest: Effort normal and breath sounds normal. No stridor. No respiratory distress. He has no wheezes. He has no rales. He exhibits no tenderness.  Musculoskeletal: Normal range of motion. He exhibits edema (effusion L knee). He exhibits no tenderness or deformity.  Neurological: He is alert and oriented to person, place, and time.  Skin: Skin is warm, dry and intact. Capillary refill takes less than 2 seconds. No rash noted. He is not diaphoretic. No erythema. No pallor.  Psychiatric: He has a normal mood  and affect. His speech is normal and behavior is normal. Judgment and thought content normal. Cognition and memory are normal.  Nursing note and vitals reviewed.   Results for orders placed or performed in visit on 09/19/18  Comprehensive metabolic panel  Result Value Ref Range   Glucose 101 (H) 65 - 99 mg/dL   BUN 28 (H) 6 - 24 mg/dL   Creatinine, Ser 2.04 (H) 0.76 - 1.27 mg/dL   GFR calc non Af Amer 36 (L) >59 mL/min/1.73   GFR calc  Af Amer 42 (L) >59 mL/min/1.73   BUN/Creatinine Ratio 14 9 - 20   Sodium 142 134 - 144 mmol/L   Potassium 3.8 3.5 - 5.2 mmol/L   Chloride 99 96 - 106 mmol/L   CO2 23 20 - 29 mmol/L   Calcium 9.9 8.7 - 10.2 mg/dL   Total Protein 7.7 6.0 - 8.5 g/dL   Albumin 4.0 3.5 - 5.5 g/dL   Globulin, Total 3.7 1.5 - 4.5 g/dL   Albumin/Globulin Ratio 1.1 (L) 1.2 - 2.2   Bilirubin Total 0.8 0.0 - 1.2 mg/dL   Alkaline Phosphatase 103 39 - 117 IU/L   AST 9 0 - 40 IU/L   ALT 12 0 - 44 IU/L  Uric acid  Result Value Ref Range   Uric Acid 12.2 (H) 3.7 - 8.6 mg/dL  Lipid Panel w/o Chol/HDL Ratio  Result Value Ref Range   Cholesterol, Total 184 100 - 199 mg/dL   Triglycerides 94 0 - 149 mg/dL   HDL 42 >39 mg/dL   VLDL Cholesterol Cal 19 5 - 40 mg/dL   LDL Calculated 123 (H) 0 - 99 mg/dL      Assessment & Plan:   Problem List Items Addressed This Visit      Other   Gout - Primary    Rechecking uric acid today. Will adjust medicines as needed. Call with any concerns.       Relevant Orders   Uric acid   Comprehensive metabolic panel    Other Visit Diagnoses    Effusion of left knee       Will obtain x-ray and drain as needed.    Relevant Orders   DG Knee Complete 4 Views Left   Chronic tension-type headache, not intractable       Will check x-ray of his neck. Await results.    Relevant Orders   DG Cervical Spine Complete       Follow up plan: Return in about 8 weeks (around 12/12/2018) for Follow up.

## 2018-10-17 NOTE — Patient Instructions (Signed)
Headache and Arthritis If you have arthritis and headaches, it is possible the two problems are related. Some headaches can be caused by arthritis in your neck (cervicogenic headaches). Pain medicine is another possible link between arthritis and headache. If you take a lot of over-the-counter medicines for arthritis pain, you may develop the type of headache that can happen when you stop taking your over-the-counter pain reliever or lower your dose too quickly (rebound headache). What types of arthritis can cause a headache? There are two types of arthritis, rheumatoid arthritis and osteoarthritis. Both types of arthritis can cause headaches.  Rheumatoid arthritis (RA) is an autoimmune disease that causes inflammation of your joints. When you have RA, your body's defense system (immune system) attacks the joints of your body and causes inflammation. This can lead to deformity over time.  Osteoarthritis (OA) is wear and tear caused by joint use over time. Osteoarthritis is not an inflammatory disease.  Both OA and RA can cause neck pain that is felt in the head. When the pain is felt in a different location than it originates, it is called radiating or referred pain. This pain is usually felt in the back of the head. How are headaches and arthritis related? RA can affect any joint in the body, including the joints between the bones of the neck (cervical vertebrae). The neck joints most commonly affected by RA are the top two joints, between the first and second cervical vertebra. Inflammation in these joints may be felt as neck pain and head pain. OA of the neck may be caused by gradual wear and tear or by a neck injury. Neck vertebrae may develop calcium deposits in the areas where muscle attach. Wear and tear of the vertebra may cause pressure on the nerves that leave the spinal cord. These changes can cause referred pain that may be felt as a headache. How are headaches associated with arthritis  diagnosed?  Your health care provider may diagnose headache caused by RA if you have inflammation of vertebrae in your neck. You may have: ? Blood tests to measure how much inflammation you have. ? Imaging studies of your neck (MRI) to check for inflammation of cervical vertebrae.  Your health care provider may diagnose headache caused by OA if an X-ray shows: ? Calcium deposits. ? Bone spurs. ? Narrowing of the space between neck vertebrae.  Your health care provider may diagnose rebound headache if you have a history of using over-the-counter pain relievers frequently. When should I seek care for my headaches? Call your health care provider if:  You have more than three headaches per week.  You take an over-the-counter pain reliever almost every day.  Your headaches are getting worse and happening more often.  You have headache with fever.  You have headache with numbness, weakness, or dizziness.  You have headache with nausea or vomiting.  What are my treatment options?  If you have headache caused by RA, treatment may include: ? Over-the-counter or prescription-strength anti-inflammatory medicines. ? Disease-modifying antirheumatic drugs (DMARDs). These medicines slow or stop the progression of RA.  If you have headache caused by OA, treatment may include: ? Over-the-counter pain medicines. ? Heat or massage. ? Physical therapy.  If you have rebound headaches: ? They will usually go away within several days of stopping the medicine that caused them. ? You may be able to gradually reduce the amount of medicines you take to prevent headache. ? Ask your health care provider if you can take  another type of medicine instead. This information is not intended to replace advice given to you by your health care provider. Make sure you discuss any questions you have with your health care provider. Document Released: 03/08/2004 Document Revised: 05/24/2016 Document Reviewed:  03/22/2014 Elsevier Interactive Patient Education  2018 [employer].

## 2018-10-18 LAB — COMPREHENSIVE METABOLIC PANEL
ALK PHOS: 106 IU/L (ref 39–117)
ALT: 16 IU/L (ref 0–44)
AST: 15 IU/L (ref 0–40)
Albumin/Globulin Ratio: 1.2 (ref 1.2–2.2)
Albumin: 3.9 g/dL (ref 3.5–5.5)
BUN/Creatinine Ratio: 12 (ref 9–20)
BUN: 24 mg/dL (ref 6–24)
Bilirubin Total: 0.4 mg/dL (ref 0.0–1.2)
CALCIUM: 9.7 mg/dL (ref 8.7–10.2)
CHLORIDE: 97 mmol/L (ref 96–106)
CO2: 24 mmol/L (ref 20–29)
Creatinine, Ser: 2.05 mg/dL — ABNORMAL HIGH (ref 0.76–1.27)
GFR calc Af Amer: 42 mL/min/{1.73_m2} — ABNORMAL LOW (ref >59)
GFR, EST NON AFRICAN AMERICAN: 36 mL/min/{1.73_m2} — AB (ref >59)
GLOBULIN, TOTAL: 3.3 g/dL (ref 1.5–4.5)
Glucose: 98 mg/dL (ref 65–99)
POTASSIUM: 3.9 mmol/L (ref 3.5–5.2)
SODIUM: 139 mmol/L (ref 134–144)
Total Protein: 7.2 g/dL (ref 6.0–8.5)

## 2018-10-18 LAB — URIC ACID: Uric Acid: 12.8 mg/dL — ABNORMAL HIGH (ref 3.7–8.6)

## 2018-10-19 ENCOUNTER — Telehealth: Payer: Self-pay | Admitting: Family Medicine

## 2018-10-19 DIAGNOSIS — M1A9XX0 Chronic gout, unspecified, without tophus (tophi): Secondary | ICD-10-CM

## 2018-10-19 MED ORDER — ALLOPURINOL 100 MG PO TABS: 200.0000 mg | ORAL_TABLET | Freq: Every day | ORAL | 6 refills | Status: DC

## 2018-10-19 NOTE — Telephone Encounter (Signed)
Please let him know that his x-ray of his knee shows really bad arthritis- we can do a shot whenever he'd like.   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 We can get him back into orthopedics if he'd like.   His labs came back stable- but his uric acid is still high. Let's have him go up to 200mg  on his allopurinol daily and we'll recheck in 1 month. Thanks!  (Rx to pharmacy and order in)

## 2018-10-20 NOTE — Telephone Encounter (Signed)
Pt notified of lab results and stated he will call back to let us know what he would like to do about his arthritis.

## 2018-10-21 ENCOUNTER — Ambulatory Visit: Payer: Commercial Managed Care - PPO | Admitting: Family Medicine

## 2018-10-21 ENCOUNTER — Encounter: Payer: Self-pay | Admitting: Family Medicine

## 2018-10-31 ENCOUNTER — Encounter: Payer: Self-pay | Admitting: Family Medicine

## 2018-10-31 NOTE — Telephone Encounter (Signed)
Called patient to let him know that he needs to contact [facility] to let them know that he is seeing a cardiologist now.

## 2018-11-05 ENCOUNTER — Telehealth: Payer: Self-pay

## 2018-11-05 NOTE — Telephone Encounter (Signed)
-----   Message from Emily Filbert, RN sent at 11/05/2018 10:03 AM EST ----- Do you mind calling the patient to see if he is following with [facility] or did he still want to come in and see [name] on 11/18/18?  Thanks! ----- Message ----- From: Britt Bottom, CMA Sent: 11/04/2018  11:45 AM EST To: Emily Filbert, RN  Pt f/u with [name] 11/18/2018. Pt goes to [facility] for general cardiology needs. Pt was told to f/u with Dr. Juanell Fairly EP back 07/2018 possible ablation by [name]. Please advise if pt needs to have appointment cancelled/ going to [facility] for EP needs or coming here and needs to keep appointment.  Thank you, Lenda Kelp

## 2018-11-05 NOTE — Telephone Encounter (Signed)
Lmov for patient to call

## 2018-11-06 NOTE — Telephone Encounter (Signed)
Noted! Thank you

## 2018-11-06 NOTE — Telephone Encounter (Signed)
Patient states he didn't see Dr Avelina Laine for EP  That's why he is seeing Dr Caryl Comes He would like to keep his upcoming appointment

## 2018-11-07 ENCOUNTER — Telehealth: Payer: Self-pay | Admitting: Family Medicine

## 2018-11-07 MED ORDER — BENZONATATE 200 MG PO CAPS: 200.0000 mg | ORAL_CAPSULE | Freq: Two times a day (BID) | ORAL | 0 refills | Status: DC | PRN

## 2018-11-07 NOTE — Telephone Encounter (Signed)
Copied from [place] [id number]. Topic: Quick Communication - See Telephone Encounter >> Nov 07, 2018 12:15 PM Antonieta Iba C wrote: CRM for notification. See Telephone encounter for: 11/07/18.  Pt called in to be advised. Pt says that he has a cough and has been trying a few different things otc medications to help. Pt would like to know if provider could provide him with a Rx  Pharmacy: [facility], [place], [place] - [address] [phone] (Phone) [phone] (Fax)

## 2018-11-07 NOTE — Telephone Encounter (Signed)
Tessalon perles sent to his pharmacy. If not better with that, should be seen.

## 2018-11-07 NOTE — Telephone Encounter (Signed)
Patient notified and verbalized understanding. 

## 2018-11-10 ENCOUNTER — Encounter: Payer: Commercial Managed Care - PPO | Attending: Internal Medicine | Admitting: *Deleted

## 2018-11-10 ENCOUNTER — Encounter: Payer: Self-pay | Admitting: *Deleted

## 2018-11-10 VITALS — Ht 73.0 in | Wt 223.8 lb

## 2018-11-10 DIAGNOSIS — I5022 Chronic systolic (congestive) heart failure: Secondary | ICD-10-CM | POA: Diagnosis present

## 2018-11-10 NOTE — Progress Notes (Signed)
Cardiac Individual Treatment Plan  Patient Details  Name: Stephen [name] MRN: [id number] Date of Birth: March 16, 1966 Referring Provider:     Cardiac Rehab from 11/10/2018 in [facility] Cardiac and Pulmonary Rehab  Referring Provider  [facility]      Initial Encounter Date:    Cardiac Rehab from 11/10/2018 in [facility] Cardiac and Pulmonary Rehab  Date  11/10/18      Visit Diagnosis: Heart failure, chronic systolic ([place])  Patient's Home Medications on Admission:  Current Outpatient Medications:  .  acetaminophen (TYLENOL) 325 MG tablet, Take 2 tablets (650 mg total) by mouth every 6 (six) hours as needed. (Patient taking differently: Take 325-650 mg by mouth every 6 (six) hours as needed for moderate pain. ), Disp: 60 tablet, Rfl: 0 .  albuterol (PROVENTIL HFA;VENTOLIN HFA) 108 (90 Base) MCG/ACT inhaler, Inhale 2 puffs into the lungs every 4 (four) hours as needed for wheezing or shortness of breath., Disp: 1 Inhaler, Rfl: 0 .  amiodarone (PACERONE) 200 MG tablet, Take 2 tablets (400 mg total) by mouth daily., Disp: 180 tablet, Rfl: 0 .  aspirin EC 81 MG EC tablet, Take 1 tablet (81 mg total) by mouth daily., Disp: 90 tablet, Rfl: 3 .  atorvastatin (LIPITOR) 80 MG tablet, Take 1 tablet (80 mg total) by mouth at bedtime., Disp: , Rfl:  .  carvedilol (COREG) 3.125 MG tablet, Take 1 tablet (3.125 mg total) by mouth 2 (two) times daily with a meal., Disp: 60 tablet, Rfl: 0 .  clopidogrel (PLAVIX) 75 MG tablet, Take 1 tablet (75 mg total) by mouth daily., Disp: 30 tablet, Rfl: 5 .  diclofenac sodium (VOLTAREN) 1 % GEL, Apply 2 g topically 4 (four) times daily as needed (pain). , Disp: , Rfl:  .  fluticasone (FLONASE) 50 MCG/ACT nasal spray, Place 2 sprays into both nostrils daily. (Patient taking differently: Place 2 sprays into both nostrils daily as needed for allergies. ), Disp: 16 g, Rfl: 2 .  meloxicam (MOBIC) 15 MG tablet, Take 15 mg by mouth daily., Disp: , Rfl:  .  metolazone (ZAROXOLYN) 2.5 MG  tablet, Take 1 tablet (2.5 mg total) by mouth once daily TAKE 30 MINUTES PRIOR TO LASIX, Disp: , Rfl: 11 .  mexiletine (MEXITIL) 150 MG capsule, Take 150 mg by mouth 3 (three) times daily., Disp: , Rfl:  .  Multiple Vitamin (MULTIVITAMIN WITH MINERALS) TABS tablet, Take 1 tablet by mouth daily., Disp: , Rfl:  .  potassium chloride SA (K-DUR,KLOR-CON) 20 MEQ tablet, Take 1 tablet (20 mEq total) by mouth 2 (two) times daily., Disp: 60 tablet, Rfl: 0 .  sacubitril-valsartan (ENTRESTO) 24-26 MG, Take 1 tablet by mouth 2 (two) times daily., Disp: 60 tablet, Rfl: 0 .  sildenafil (REVATIO) 20 MG tablet, Take by mouth., Disp: , Rfl:  .  Tiotropium Bromide Monohydrate (SPIRIVA RESPIMAT) 2.5 MCG/ACT AERS, Inhale 1 puff into the lungs daily., Disp: 1 Inhaler, Rfl: 12 .  torsemide (DEMADEX) 20 MG tablet, Take 2 tablets (40 mg total) by mouth daily., Disp: 60 tablet, Rfl: 0 .  allopurinol (ZYLOPRIM) 100 MG tablet, Take 2 tablets (200 mg total) by mouth daily., Disp: 60 tablet, Rfl: 6 .  atorvastatin (LIPITOR) 40 MG tablet, TAKE ONE TABLET BY MOUTH EVERY EVENING AT 6PM, Disp: , Rfl: 6 .  benzonatate (TESSALON) 200 MG capsule, Take 1 capsule (200 mg total) by mouth 2 (two) times daily as needed for cough. (Patient not taking: Reported on 11/10/2018), Disp: 20 capsule, Rfl: 0 .  Camphor-Eucalyptus-Menthol (CHEST RUB) 4.8-1.2-2.6 % OINT, Apply 1 application topically daily as needed (congestion). , Disp: , Rfl:  .  colchicine 0.6 MG tablet, Take 1 tablet (0.6 mg total) by mouth daily as needed (gout flare). (Patient not taking: Reported on 11/10/2018), Disp: 6 tablet, Rfl: 1 .  fluticasone-salmeterol (ADVAIR HFA) 115-21 MCG/ACT inhaler, Inhale 2 puffs into the lungs 2 (two) times daily. (Patient not taking: Reported on 11/10/2018), Disp: 1 Inhaler, Rfl: 12 .  furosemide (LASIX) 40 MG tablet, , Disp: , Rfl:  .  Menthol, Topical Analgesic, (ICY HOT EX), Apply 1 application topically daily as needed (pain)., Disp: , Rfl:   .  nitroGLYCERIN (NITROSTAT) 0.4 MG SL tablet, Place 1 tablet (0.4 mg total) under the tongue every 5 (five) minutes as needed for chest pain. (Patient not taking: Reported on 11/10/2018), Disp: 25 tablet, Rfl: 3  Past Medical History: Past Medical History:  Diagnosis Date  . AICD (automatic cardioverter/defibrillator) present   . AKI (acute kidney injury) ([place]) 12/24/2017  . Arrhythmia   . Arthritis    "lower back, knees" (06/10/2018)  . Cardiac arrest ([place]) 12/23/2017   Brief V-fib arrest  . Chest pain 09/08/2017  . CHF (congestive heart failure) (H[facility])    nonischemic cardiomyopathy, EF 25%  . Gout    "on daily RX" (06/10/2018)  . Headache    "q couple months" (06/10/2018)  . High cholesterol   . History of kidney stones   . Hypertension   . Influenza A 12/24/2017  . NICM (nonischemic cardiomyopathy) ([place])   . Pneumonia 12/20/2017  . TIA (transient ischemic attack) 06/21/2016   "still affects my memory a little bit" (06/10/2018)    Tobacco Use: Social History   Tobacco Use  Smoking Status Former Smoker  . Packs/day: 0.33  . Years: 33.00  . Pack years: 10.89  . Types: Cigarettes  . Last attempt to quit: 02/20/2016  . Years since quitting: 2.7  Smokeless Tobacco Never Used    Labs: Recent Review Flowsheet Data    Labs for ITP Cardiac and Pulmonary Rehab Latest Ref Rng & Units 12/26/2017 12/27/2017 05/13/2018 06/16/2018 09/19/2018   Cholestrol 100 - 199 mg/dL - - 240(H) 181 184   LDLCALC 0 - 99 mg/dL - - 167(H) 123(H) 123(H)   HDL >39 mg/dL - - 49 36(L) 42   Trlycerides 0 - 149 mg/dL - - 122 110 94   PHART 7.350 - 7.450 - - - - -   PCO2ART 32.0 - 48.0 mmHg - - - - -   HCO3 20.0 - 28.0 mmol/L - - - - -   TCO2 22 - 32 mmol/L - - - - -   ACIDBASEDEF 0.0 - 2.0 mmol/L - - - - -   O2SAT % 54.9 57.2 - - -       Exercise Target Goals: Exercise Program Goal: Individual exercise prescription set using results from initial 6 min walk test and THRR while considering  patient's  activity barriers and safety.   Exercise Prescription Goal: Initial exercise prescription builds to 30-45 minutes a day of aerobic activity, 2-3 days per week.  Home exercise guidelines will be given to patient during program as part of exercise prescription that the participant will acknowledge.  Activity Barriers & Risk Stratification: Activity Barriers & Cardiac Risk Stratification - 11/10/18 1453      Activity Barriers & Cardiac Risk Stratification   Activity Barriers  Joint Problems;Arthritis;Muscular Weakness    Cardiac Risk Stratification  High  6 Minute Walk: 6 Minute Walk    Row Name 11/10/18 1536         6 Minute Walk   Phase  Initial     Distance  912 feet     Walk Time  6 minutes     # of Rest Breaks  0     MPH  1.73     METS  2.97     RPE  14     Perceived Dyspnea   2     VO2 Peak  10.41     Symptoms  No     Resting HR  76 bpm     Resting BP  106/64     Resting Oxygen Saturation   97 %     Exercise Oxygen Saturation  during 6 min walk  88 %     Max Ex. HR  98 bpm     Max Ex. BP  116/68     2 Minute Post BP  126/68        Oxygen Initial Assessment:   Oxygen Re-Evaluation:   Oxygen Discharge (Final Oxygen Re-Evaluation):   Initial Exercise Prescription: Initial Exercise Prescription - 11/10/18 1500      Date of Initial Exercise RX and Referring Provider   Date  11/10/18    Referring Provider  [facility]      Treadmill   MPH  1.8    Grade  1.5    Minutes  15    METs  2.75      Recumbant Bike   Level  3    RPM  60    Watts  32    Minutes  15    METs  2.97      T5 Nustep   SPM  80    Minutes  15    METs  2.9      Prescription Details   Duration  Progress to 30 minutes of continuous aerobic without signs/symptoms of physical distress      Intensity   THRR 40-80% of Max Heartrate  112-148    Ratings of Perceived Exertion  11-13    Perceived Dyspnea  0-4      Resistance Training   Training Prescription  Yes    Weight  4 lb     Reps  10-15       Perform Capillary Blood Glucose checks as needed.  Exercise Prescription Changes: Exercise Prescription Changes    Row Name 11/10/18 1500             Response to Exercise   Blood Pressure (Admit)  106/64       Blood Pressure (Exercise)  116/68       Blood Pressure (Exit)  126/68       Heart Rate (Admit)  76 bpm       Heart Rate (Exercise)  98 bpm       Heart Rate (Exit)  97 bpm       Oxygen Saturation (Admit)  98 %       Oxygen Saturation (Exercise)  97 %       Oxygen Saturation (Exit)  88 %       Rating of Perceived Exertion (Exercise)  14       Perceived Dyspnea (Exercise)  2          Exercise Comments:   Exercise Goals and Review: Exercise Goals    Row Name 11/10/18 1535  Exercise Goals   Increase Physical Activity  Yes       Intervention  Provide advice, education, support and counseling about physical activity/exercise needs.;Develop an individualized exercise prescription for aerobic and resistive training based on initial evaluation findings, risk stratification, comorbidities and participant's personal goals.       Expected Outcomes  Short Term: Attend rehab on a regular basis to increase amount of physical activity.;Long Term: Add in home exercise to make exercise part of routine and to increase amount of physical activity.;Long Term: Exercising regularly at least 3-5 days a week.       Increase Strength and Stamina  Yes       Intervention  Provide advice, education, support and counseling about physical activity/exercise needs.;Develop an individualized exercise prescription for aerobic and resistive training based on initial evaluation findings, risk stratification, comorbidities and participant's personal goals.       Expected Outcomes  Short Term: Increase workloads from initial exercise prescription for resistance, speed, and METs.;Short Term: Perform resistance training exercises routinely during rehab and add in resistance  training at home;Long Term: Improve cardiorespiratory fitness, muscular endurance and strength as measured by increased METs and functional capacity (6MWT)       Able to understand and use rate of perceived exertion (RPE) scale  Yes       Intervention  Provide education and explanation on how to use RPE scale       Expected Outcomes  Short Term: Able to use RPE daily in rehab to express subjective intensity level;Long Term:  Able to use RPE to guide intensity level when exercising independently       Able to understand and use Dyspnea scale  Yes       Intervention  Provide education and explanation on how to use Dyspnea scale       Expected Outcomes  Short Term: Able to use Dyspnea scale daily in rehab to express subjective sense of shortness of breath during exertion;Long Term: Able to use Dyspnea scale to guide intensity level when exercising independently       Knowledge and understanding of Target Heart Rate Range (THRR)  Yes       Intervention  Provide education and explanation of THRR including how the numbers were predicted and where they are located for reference       Expected Outcomes  Short Term: Able to state/look up THRR;Short Term: Able to use daily as guideline for intensity in rehab;Long Term: Able to use THRR to govern intensity when exercising independently       Able to check pulse independently  Yes       Intervention  Provide education and demonstration on how to check pulse in carotid and radial arteries.;Review the importance of being able to check your own pulse for safety during independent exercise       Expected Outcomes  Short Term: Able to explain why pulse checking is important during independent exercise;Long Term: Able to check pulse independently and accurately       Understanding of Exercise Prescription  Yes       Intervention  Provide education, explanation, and written materials on patient's individual exercise prescription       Expected Outcomes  Short Term: Able  to explain program exercise prescription;Long Term: Able to explain home exercise prescription to exercise independently          Exercise Goals Re-Evaluation :   Discharge Exercise Prescription (Final Exercise Prescription Changes): Exercise Prescription Changes - 11/10/18  1500      Response to Exercise   Blood Pressure (Admit)  106/64    Blood Pressure (Exercise)  116/68    Blood Pressure (Exit)  126/68    Heart Rate (Admit)  76 bpm    Heart Rate (Exercise)  98 bpm    Heart Rate (Exit)  97 bpm    Oxygen Saturation (Admit)  98 %    Oxygen Saturation (Exercise)  97 %    Oxygen Saturation (Exit)  88 %    Rating of Perceived Exertion (Exercise)  14    Perceived Dyspnea (Exercise)  2       Nutrition:  Target Goals: Understanding of nutrition guidelines, daily intake of sodium <1538m, cholesterol <204m calories 30% from fat and 7% or less from saturated fats, daily to have 5 or more servings of fruits and vegetables.  Biometrics: Pre Biometrics - 11/10/18 1534      Pre Biometrics   Height  '6\' 1"'  (1.854 m)    Weight  223 lb 12.8 oz (101.5 kg)    Waist Circumference  41 inches    Hip Circumference  44 inches    Waist to Hip Ratio  0.93 %    BMI (Calculated)  29.53    Single Leg Stand  12.78 seconds        Nutrition Therapy Plan and Nutrition Goals: Nutrition Therapy & Goals - 11/10/18 1452      Intervention Plan   Intervention  Prescribe, educate and counsel regarding individualized specific dietary modifications aiming towards targeted core components such as weight, hypertension, lipid management, diabetes, heart failure and other comorbidities.    Expected Outcomes  Short Term Goal: Understand basic principles of dietary content, such as calories, fat, sodium, cholesterol and nutrients.;Short Term Goal: A plan has been developed with personal nutrition goals set during dietitian appointment.;Long Term Goal: Adherence to prescribed nutrition plan.       Nutrition  Assessments: Nutrition Assessments - 11/10/18 1452      MEDFICTS Scores   Pre Score  30       Nutrition Goals Re-Evaluation:   Nutrition Goals Discharge (Final Nutrition Goals Re-Evaluation):   Psychosocial: Target Goals: Acknowledge presence or absence of significant depression and/or stress, maximize coping skills, provide positive support system. Participant is able to verbalize types and ability to use techniques and skills needed for reducing stress and depression.   Initial Review & Psychosocial Screening: Initial Psych Review & Screening - 11/10/18 1446      Initial Review   Current issues with  Current Sleep Concerns;Current Stress Concerns   issues with shortness of breath while sleeping   Source of Stress Concerns  Transportation;Unable to perform yard/household activities      [place] Yes   family, friends     Screening Interventions   Interventions  Encouraged to exercise;Program counselor consult;To provide support and resources with identified psychosocial needs;Provide feedback about the scores to participant    Expected Outcomes  Short Term goal: Utilizing psychosocial counselor, staff and physician to assist with identification of specific Stressors or current issues interfering with healing process. Setting desired goal for each stressor or current issue identified.;Long Term Goal: Stressors or current issues are controlled or eliminated.;Short Term goal: Identification and review with participant of any Quality of Life or Depression concerns found by scoring the questionnaire.;Long Term goal: The participant improves quality of Life and PHQ9 Scores as seen by post scores and/or verbalization of changes  Quality of Life Scores:  Quality of Life - 11/10/18 1448      Quality of Life   Select  Quality of Life      Quality of Life Scores   Health/Function Pre  21.03 %    Socioeconomic Pre  25.31 %    Psych/Spiritual Pre   28.29 %    Family Pre  21.6 %    GLOBAL Pre  23.54 %      Scores of 19 and below usually indicate a poorer quality of life in these areas.  A difference of  2-3 points is a clinically meaningful difference.  A difference of 2-3 points in the total score of the Quality of Life Index has been associated with significant improvement in overall quality of life, self-image, physical symptoms, and general health in studies assessing change in quality of life.  PHQ-9: Recent Review Flowsheet Data    Depression screen [facility] 2/9 11/10/2018 01/23/2018 10/03/2017 09/30/2017 08/29/2017   Decreased Interest 3 1 0 0 0   Down, Depressed, Hopeless 0 0 0 0 0   PHQ - 2 Score 3 1 0 0 0   Altered sleeping 3 2 - - -   Tired, decreased energy 2 1 - - -   Change in appetite 0 1 - - -   Feeling bad or failure about yourself  0 0 - - -   Trouble concentrating 0 0 - - -   Moving slowly or fidgety/restless 1 0 - - -   Suicidal thoughts 0 0 - - -   PHQ-9 Score 9 5 - - -   Difficult doing work/chores Somewhat difficult Not difficult at all - - -     Interpretation of Total Score  Total Score Depression Severity:  1-4 = Minimal depression, 5-9 = Mild depression, 10-14 = Moderate depression, 15-19 = Moderately severe depression, 20-27 = Severe depression   Psychosocial Evaluation and Intervention:   Psychosocial Re-Evaluation:   Psychosocial Discharge (Final Psychosocial Re-Evaluation):   Vocational Rehabilitation: Provide vocational rehab assistance to qualifying candidates.   Vocational Rehab Evaluation & Intervention: Vocational Rehab - 11/10/18 1446      Initial Vocational Rehab Evaluation & Intervention   Assessment shows need for Vocational Rehabilitation  No       Education: Education Goals: Education classes will be provided on a variety of topics geared toward better understanding of heart health and risk factor modification. Participant will state understanding/return demonstration of topics  presented as noted by education test scores.  Learning Barriers/Preferences: Learning Barriers/Preferences - 11/10/18 1431      Learning Barriers/Preferences   Learning Barriers  None    Learning Preferences  Group Instruction;Skilled Demonstration       Education Topics:  AED/CPR: - Group verbal and written instruction with the use of models to demonstrate the basic use of the AED with the basic ABC's of resuscitation.   Cardiac Rehab from 03/13/2018 in [facility] Cardiac and Pulmonary Rehab  Date  02/11/18  Educator  SB  Instruction Review Code  1- Verbalizes Understanding      General Nutrition Guidelines/Fats and Fiber: -Group instruction provided by verbal, written material, models and posters to present the general guidelines for heart healthy nutrition. Gives an explanation and review of dietary fats and fiber.   Controlling Sodium/Reading Food Labels: -Group verbal and written material supporting the discussion of sodium use in heart healthy nutrition. Review and explanation with models, verbal and written materials for utilization of the food label.  Cardiac Rehab from 03/13/2018 in [facility] Cardiac and Pulmonary Rehab  Date  02/04/18  Educator  CR  Instruction Review Code  1- Verbalizes Understanding      Exercise Physiology & General Exercise Guidelines: - Group verbal and written instruction with models to review the exercise physiology of the cardiovascular system and associated critical values. Provides general exercise guidelines with specific guidelines to those with heart or lung disease.    Cardiac Rehab from 03/13/2018 in [facility] Cardiac and Pulmonary Rehab  Date  02/20/18  Educator  [facility]  Instruction Review Code  1- Verbalizes Understanding      Aerobic Exercise & Resistance Training: - Gives group verbal and written instruction on the various components of exercise. Focuses on aerobic and resistive training programs and the benefits of this training and how to safely  progress through these programs..   Flexibility, Balance, Mind/Body Relaxation: Provides group verbal/written instruction on the benefits of flexibility and balance training, including mind/body exercise modes such as yoga, pilates and tai chi.  Demonstration and skill practice provided.   Stress and Anxiety: - Provides group verbal and written instruction about the health risks of elevated stress and causes of high stress.  Discuss the correlation between heart/lung disease and anxiety and treatment options. Review healthy ways to manage with stress and anxiety.   Cardiac Rehab from 03/13/2018 in [facility] Cardiac and Pulmonary Rehab  Date  03/04/18  Educator  [facility]  Instruction Review Code  1- Verbalizes Understanding      Depression: - Provides group verbal and written instruction on the correlation between heart/lung disease and depressed mood, treatment options, and the stigmas associated with seeking treatment.   Cardiac Rehab from 03/13/2018 in [facility] Cardiac and Pulmonary Rehab  Date  02/18/18  Educator  [facility]  Instruction Review Code  1- Verbalizes Understanding      Anatomy & Physiology of the Heart: - Group verbal and written instruction and models provide basic cardiac anatomy and physiology, with the coronary electrical and arterial systems. Review of Valvular disease and Heart Failure   Cardiac Procedures: - Group verbal and written instruction to review commonly prescribed medications for heart disease. Reviews the medication, class of the drug, and side effects. Includes the steps to properly store meds and maintain the prescription regimen. (beta blockers and nitrates)   Cardiac Medications I: - Group verbal and written instruction to review commonly prescribed medications for heart disease. Reviews the medication, class of the drug, and side effects. Includes the steps to properly store meds and maintain the prescription regimen.   Cardiac Rehab from 03/13/2018 in [facility] Cardiac  and Pulmonary Rehab  Date  03/11/18  Educator  SB  Instruction Review Code  1- Verbalizes Understanding      Cardiac Medications II: -Group verbal and written instruction to review commonly prescribed medications for heart disease. Reviews the medication, class of the drug, and side effects. (all other drug classes)   Cardiac Rehab from 03/13/2018 in [facility] Cardiac and Pulmonary Rehab  Date  03/06/18  Educator  [facility]  Instruction Review Code  1- Verbalizes Understanding       Go Sex-Intimacy & Heart Disease, Get SMART - Goal Setting: - Group verbal and written instruction through game format to discuss heart disease and the return to sexual intimacy. Provides group verbal and written material to discuss and apply goal setting through the application of the S.M.A.R.T. Method.   Other Matters of the Heart: - Provides group verbal, written materials and models to describe  Stable Angina and Peripheral Artery. Includes description of the disease process and treatment options available to the cardiac patient.   Exercise & Equipment Safety: - Individual verbal instruction and demonstration of equipment use and safety with use of the equipment.   Cardiac Rehab from 11/10/2018 in [facility] Cardiac and Pulmonary Rehab  Date  11/10/18  Educator  [facility]  Instruction Review Code  1- Verbalizes Understanding      Infection Prevention: - Provides verbal and written material to individual with discussion of infection control including proper hand washing and proper equipment cleaning during exercise session.   Cardiac Rehab from 11/10/2018 in [facility] Cardiac and Pulmonary Rehab  Date  11/10/18  Educator  [facility]  Instruction Review Code  1- Verbalizes Understanding      Falls Prevention: - Provides verbal and written material to individual with discussion of falls prevention and safety.   Cardiac Rehab from 11/10/2018 in [facility] Cardiac and Pulmonary Rehab  Date  11/10/18  Educator  [facility]  Instruction Review Code   1- Verbalizes Understanding      Diabetes: - Individual verbal and written instruction to review signs/symptoms of diabetes, desired ranges of glucose level fasting, after meals and with exercise. Acknowledge that pre and post exercise glucose checks will be done for 3 sessions at entry of program.   Know Your Numbers and Risk Factors: -Group verbal and written instruction about important numbers in your health.  Discussion of what are risk factors and how they play a role in the disease process.  Review of Cholesterol, Blood Pressure, Diabetes, and BMI and the role they play in your overall health.   Cardiac Rehab from 03/13/2018 in [facility] Cardiac and Pulmonary Rehab  Date  03/06/18  Educator  [facility]  Instruction Review Code  1- Verbalizes Understanding      Sleep Hygiene: -Provides group verbal and written instruction about how sleep can affect your health.  Define sleep hygiene, discuss sleep cycles and impact of sleep habits. Review good sleep hygiene tips.    Cardiac Rehab from 03/13/2018 in [facility] Cardiac and Pulmonary Rehab  Date  01/30/18  Educator  [facility]  Instruction Review Code  1- Verbalizes Understanding      Other: -Provides group and verbal instruction on various topics (see comments)   Knowledge Questionnaire Score: Knowledge Questionnaire Score - 11/10/18 1444      Knowledge Questionnaire Score   Pre Score  13/26   correct answers reviewed with [name]. focus on nutrition, exercise, Angina      Core Components/Risk Factors/Patient Goals at Admission: Personal Goals and Risk Factors at Admission - 11/10/18 1428      Core Components/Risk Factors/Patient Goals on Admission    Weight Management  Yes;Weight Maintenance    Intervention  Weight Management: Develop a combined nutrition and exercise program designed to reach desired caloric intake, while maintaining appropriate intake of nutrient and fiber, sodium and fats, and appropriate energy expenditure required for the weight  goal.;Weight Management: Provide education and appropriate resources to help participant work on and attain dietary goals.    Admit Weight  225 lb (102.1 kg)    Heart Failure  Yes    Intervention  Provide a combined exercise and nutrition program that is supplemented with education, support and counseling about heart failure. Directed toward relieving symptoms such as shortness of breath, decreased exercise tolerance, and extremity edema.    Expected Outcomes  Improve functional capacity of life;Short term: Attendance in program 2-3 days a week with increased exercise capacity.  Reported lower sodium intake. Reported increased fruit and vegetable intake. Reports medication compliance.;Short term: Daily weights obtained and reported for increase. Utilizing diuretic protocols set by physician.;Long term: Adoption of self-care skills and reduction of barriers for early signs and symptoms recognition and intervention leading to self-care maintenance.    Lipids  Yes    Intervention  Provide education and support for participant on nutrition & aerobic/resistive exercise along with prescribed medications to achieve LDL <20m, HDL >43m    Expected Outcomes  Short Term: Participant states understanding of desired cholesterol values and is compliant with medications prescribed. Participant is following exercise prescription and nutrition guidelines.;Long Term: Cholesterol controlled with medications as prescribed, with individualized exercise RX and with personalized nutrition plan. Value goals: LDL < 703mHDL > 40 mg.       Core Components/Risk Factors/Patient Goals Review:    Core Components/Risk Factors/Patient Goals at Discharge (Final Review):    ITP Comments: ITP Comments    Row Name 11/10/18 1400           ITP Comments  Med Review completed. Initial ITP created. Diagnosis can be found in [facility] 10/30          Comments: Initial ITP

## 2018-11-10 NOTE — Progress Notes (Signed)
Daily Session Note  Patient Details  Name: Stephen [name] MRN: [id number] Date of Birth: 21-Nov-1966 Referring Provider:     Cardiac Rehab from 11/10/2018 in [facility] Cardiac and Pulmonary Rehab  Referring Provider  [facility]      Encounter Date: 11/10/2018  Check In: Session Check In - 11/10/18 1358      Check-In   Supervising physician immediately available to respond to emergencies  See telemetry face sheet for immediately available ER MD    Location  [facility]-Cardiac & Pulmonary Rehab    Staff Present  Renita Papa, RN Vickki Hearing, BA, ACSM CEP, Exercise Physiologist    Medication changes reported      No    Fall or balance concerns reported     No    Warm-up and Cool-down  Not performed (comment)   med review   Resistance Training Performed  Yes    VAD Patient?  No    PAD/SET Patient?  No      Pain Assessment   Currently in Pain?  No/denies        Exercise Prescription Changes - 11/10/18 1500      Response to Exercise   Blood Pressure (Admit)  106/64    Blood Pressure (Exercise)  116/68    Blood Pressure (Exit)  126/68    Heart Rate (Admit)  76 bpm    Heart Rate (Exercise)  98 bpm    Heart Rate (Exit)  97 bpm    Oxygen Saturation (Admit)  98 %    Oxygen Saturation (Exercise)  97 %    Oxygen Saturation (Exit)  88 %    Rating of Perceived Exertion (Exercise)  14    Perceived Dyspnea (Exercise)  2       Social History   Tobacco Use  Smoking Status Former Smoker  . Packs/day: 0.33  . Years: 33.00  . Pack years: 10.89  . Types: Cigarettes  . Last attempt to quit: 02/20/2016  . Years since quitting: 2.7  Smokeless Tobacco Never Used    Goals Met:  Proper associated with RPD/PD & O2 Sat Exercise tolerated well No report of cardiac concerns or symptoms Strength training completed today  Goals Unmet:  Not Applicable  Comments: Med Review completed   Dr. Emily Filbert is Medical Director for [facility].

## 2018-11-10 NOTE — Patient Instructions (Signed)
Patient Instructions  Patient Details  Name: Stephen [name] MRN: [id number] Date of Birth: 1966/09/07 Referring Provider:  Yolonda Kida, MD  Below are your personal goals for exercise, nutrition, and risk factors. Our goal is to help you stay on track towards obtaining and maintaining these goals. We will be discussing your progress on these goals with you throughout the program.  Initial Exercise Prescription: Initial Exercise Prescription - 11/10/18 1500      Date of Initial Exercise RX and Referring Provider   Date  11/10/18    Referring Provider  [facility]      Treadmill   MPH  1.8    Grade  1.5    Minutes  15    METs  2.75      Recumbant Bike   Level  3    RPM  60    Watts  32    Minutes  15    METs  2.97      T5 Nustep   SPM  80    Minutes  15    METs  2.9      Prescription Details   Duration  Progress to 30 minutes of continuous aerobic without signs/symptoms of physical distress      Intensity   THRR 40-80% of Max Heartrate  112-148    Ratings of Perceived Exertion  11-13    Perceived Dyspnea  0-4      Resistance Training   Training Prescription  Yes    Weight  4 lb    Reps  10-15       Exercise Goals: Frequency: Be able to perform aerobic exercise two to three times per week in program working toward 2-5 days per week of home exercise.  Intensity: Work with a perceived exertion of 11 (fairly light) - 15 (hard) while following your exercise prescription.  We will make changes to your prescription with you as you progress through the program.   Duration: Be able to do 30 to 45 minutes of continuous aerobic exercise in addition to a 5 minute warm-up and a 5 minute cool-down routine.   Nutrition Goals: Your personal nutrition goals will be established when you do your nutrition analysis with the dietician.  The following are general nutrition guidelines to follow: Cholesterol < 200mg /day Sodium < 1500mg /day Fiber: Men over 50 yrs - 30 grams  per day  Personal Goals: Personal Goals and Risk Factors at Admission - 11/10/18 1428      Core Components/Risk Factors/Patient Goals on Admission    Weight Management  Yes;Weight Maintenance    Intervention  Weight Management: Develop a combined nutrition and exercise program designed to reach desired caloric intake, while maintaining appropriate intake of nutrient and fiber, sodium and fats, and appropriate energy expenditure required for the weight goal.;Weight Management: Provide education and appropriate resources to help participant work on and attain dietary goals.    Admit Weight  225 lb (102.1 kg)    Heart Failure  Yes    Intervention  Provide a combined exercise and nutrition program that is supplemented with education, support and counseling about heart failure. Directed toward relieving symptoms such as shortness of breath, decreased exercise tolerance, and extremity edema.    Expected Outcomes  Improve functional capacity of life;Short term: Attendance in program 2-3 days a week with increased exercise capacity. Reported lower sodium intake. Reported increased fruit and vegetable intake. Reports medication compliance.;Short term: Daily weights obtained and reported for increase. Utilizing diuretic protocols set by  physician.;Long term: Adoption of self-care skills and reduction of barriers for early signs and symptoms recognition and intervention leading to self-care maintenance.    Lipids  Yes    Intervention  Provide education and support for participant on nutrition & aerobic/resistive exercise along with prescribed medications to achieve LDL 70mg , HDL >40mg .    Expected Outcomes  Short Term: Participant states understanding of desired cholesterol values and is compliant with medications prescribed. Participant is following exercise prescription and nutrition guidelines.;Long Term: Cholesterol controlled with medications as prescribed, with individualized exercise RX and with  personalized nutrition plan. Value goals: LDL < 70mg , HDL > 40 mg.       Tobacco Use Initial Evaluation: Social History   Tobacco Use  Smoking Status Former Smoker  . Packs/day: 0.33  . Years: 33.00  . Pack years: 10.89  . Types: Cigarettes  . Last attempt to quit: 02/20/2016  . Years since quitting: 2.7  Smokeless Tobacco Never Used    Exercise Goals and Review: Exercise Goals    Row Name 11/10/18 1535             Exercise Goals   Increase Physical Activity  Yes       Intervention  Provide advice, education, support and counseling about physical activity/exercise needs.;Develop an individualized exercise prescription for aerobic and resistive training based on initial evaluation findings, risk stratification, comorbidities and participant's personal goals.       Expected Outcomes  Short Term: Attend rehab on a regular basis to increase amount of physical activity.;Long Term: Add in home exercise to make exercise part of routine and to increase amount of physical activity.;Long Term: Exercising regularly at least 3-5 days a week.       Increase Strength and Stamina  Yes       Intervention  Provide advice, education, support and counseling about physical activity/exercise needs.;Develop an individualized exercise prescription for aerobic and resistive training based on initial evaluation findings, risk stratification, comorbidities and participant's personal goals.       Expected Outcomes  Short Term: Increase workloads from initial exercise prescription for resistance, speed, and METs.;Short Term: Perform resistance training exercises routinely during rehab and add in resistance training at home;Long Term: Improve cardiorespiratory fitness, muscular endurance and strength as measured by increased METs and functional capacity (6MWT)       Able to understand and use rate of perceived exertion (RPE) scale  Yes       Intervention  Provide education and explanation on how to use RPE scale        Expected Outcomes  Short Term: Able to use RPE daily in rehab to express subjective intensity level;Long Term:  Able to use RPE to guide intensity level when exercising independently       Able to understand and use Dyspnea scale  Yes       Intervention  Provide education and explanation on how to use Dyspnea scale       Expected Outcomes  Short Term: Able to use Dyspnea scale daily in rehab to express subjective sense of shortness of breath during exertion;Long Term: Able to use Dyspnea scale to guide intensity level when exercising independently       Knowledge and understanding of Target Heart Rate Range (THRR)  Yes       Intervention  Provide education and explanation of THRR including how the numbers were predicted and where they are located for reference       Expected Outcomes  Short Term:  Able to state/look up THRR;Short Term: Able to use daily as guideline for intensity in rehab;Long Term: Able to use THRR to govern intensity when exercising independently       Able to check pulse independently  Yes       Intervention  Provide education and demonstration on how to check pulse in carotid and radial arteries.;Review the importance of being able to check your own pulse for safety during independent exercise       Expected Outcomes  Short Term: Able to explain why pulse checking is important during independent exercise;Long Term: Able to check pulse independently and accurately       Understanding of Exercise Prescription  Yes       Intervention  Provide education, explanation, and written materials on patient's individual exercise prescription       Expected Outcomes  Short Term: Able to explain program exercise prescription;Long Term: Able to explain home exercise prescription to exercise independently          Copy of goals given to participant.

## 2018-11-13 ENCOUNTER — Ambulatory Visit (INDEPENDENT_AMBULATORY_CARE_PROVIDER_SITE_OTHER): Payer: Commercial Managed Care - PPO | Admitting: Family Medicine

## 2018-11-13 ENCOUNTER — Encounter: Payer: Self-pay | Admitting: Family Medicine

## 2018-11-13 ENCOUNTER — Ambulatory Visit: Payer: Self-pay

## 2018-11-13 VITALS — BP 129/81 | HR 86 | Temp 98.3°F | Wt 236.2 lb

## 2018-11-13 DIAGNOSIS — R109 Unspecified abdominal pain: Secondary | ICD-10-CM

## 2018-11-13 DIAGNOSIS — N200 Calculus of kidney: Secondary | ICD-10-CM

## 2018-11-13 LAB — UA/M W/RFLX CULTURE, ROUTINE
GLUCOSE, UA: NEGATIVE
NITRITE UA: NEGATIVE
PH UA: 5.5 (ref 5.0–7.5)
Specific Gravity, UA: 1.02 (ref 1.005–1.030)
UUROB: 0.2 mg/dL (ref 0.2–1.0)

## 2018-11-13 LAB — MICROSCOPIC EXAMINATION: BACTERIA UA: NONE SEEN

## 2018-11-13 LAB — CUP PACEART REMOTE DEVICE CHECK
Battery Remaining Longevity: 132 mo
HighPow Impedance: 70 Ohm
Implantable Lead Implant Date: 20181231
Implantable Lead Location: 753860
Implantable Pulse Generator Implant Date: 20181231
Lead Channel Impedance Value: 342 Ohm
Lead Channel Pacing Threshold Amplitude: 1.75 V
Lead Channel Pacing Threshold Pulse Width: 0.4 ms
Lead Channel Setting Pacing Pulse Width: 1 ms
Lead Channel Setting Sensing Sensitivity: 0.3 mV
MDC IDC MSMT BATTERY VOLTAGE: 3.07 V
MDC IDC MSMT LEADCHNL RV IMPEDANCE VALUE: 418 Ohm
MDC IDC MSMT LEADCHNL RV SENSING INTR AMPL: 5.25 mV
MDC IDC MSMT LEADCHNL RV SENSING INTR AMPL: 5.25 mV
MDC IDC SESS DTM: 20191009042204
MDC IDC SET LEADCHNL RV PACING AMPLITUDE: 2.5 V
MDC IDC STAT BRADY RV PERCENT PACED: 0.06 %

## 2018-11-13 MED ORDER — HYDROCODONE-ACETAMINOPHEN 10-325 MG PO TABS: 1.0000 | ORAL_TABLET | Freq: Three times a day (TID) | ORAL | 0 refills | Status: AC | PRN

## 2018-11-13 MED ORDER — TAMSULOSIN HCL 0.4 MG PO CAPS: 0.4000 mg | ORAL_CAPSULE | Freq: Every day | ORAL | 0 refills | Status: DC

## 2018-11-13 MED ORDER — CIPROFLOXACIN HCL 500 MG PO TABS: 500.0000 mg | ORAL_TABLET | Freq: Two times a day (BID) | ORAL | 0 refills | Status: DC

## 2018-11-13 NOTE — Telephone Encounter (Signed)
Phone call rec'd from pt. with c/o left lower back pain that radiates to left side x 2 days. Pain is constant; rated at 7/10.  Denied nausea or vomiting, or fever/ chills.  Reported urinating adequately; denied blood in urine.  Stated he has had a kidney stone in the past, and pain is very similar to past experience with kidney stone.  Attempted to sched. Pt. With PCP office.  Stated "I only want to see Dr. Wynetta Emery."  Advised since not able to get pt. In to office within 24 hrs., he should be seen in the ER. Pt. stated he has an appt. on Monday with PCP.  Advised against waiting until Monday, and recommended he go to the ER.  Pt. Agreed with plan.     Reason for Disposition . MODERATE pain (e.g., interferes with normal activities or awakens from sleep)    C/o constant pain left lower back and radiates to left side x 2 days; rated 7/10; no available appt's. With PCP. (pt. Stated he only wants to see PCP)  Answer Assessment - Initial Assessment Questions 1. ONSET: "When did the pain begin?"      Left side x 2 days  2. LOCATION: "Where does it hurt?" (upper, mid or lower back)     Left side of back about lower region and radiates to left side 3. SEVERITY: "How bad is the pain?"  (e.g., Scale 1-10; mild, moderate, or severe)   - MILD (1-3): doesn't interfere with normal activities    - MODERATE (4-7): interferes with normal activities or awakens from sleep    - SEVERE (8-10): excruciating pain, unable to do any normal activities      Constant 7/10 4. PATTERN: "Is the pain constant?" (e.g., yes, no; constant, intermittent)      Constant  5. RADIATION: "Does the pain shoot into your legs or elsewhere?"     Yes from left lower back to left side 6. CAUSE:  "What do you think is causing the back pain?"      Thinks this is a kidney stone 7. BACK OVERUSE:  "Any recent lifting of heavy objects, strenuous work or exercise?"     No  8. MEDICATIONS: "What have you taken so far for the pain?" (e.g., nothing,  acetaminophen, NSAIDS)     Is not taking any medication 9. NEUROLOGIC SYMPTOMS: "Do you have any weakness, numbness, or problems with bowel/bladder control?"     Denied 10. OTHER SYMPTOMS: "Do you have any other symptoms?" (e.g., fever, abdominal pain, burning with urination, blood in urine)       Denied fever/ chills, nausea or vomiting, no blood in urine; c/o cramp in right hand that has resolved  Answer Assessment - Initial Assessment Questions 1. LOCATION: "Where does it hurt?" (e.g., left, right)     Left side of lower back radiates to left side 2. ONSET: "When did the pain start?"     2 days ago 3. SEVERITY: "How bad is the pain?" (e.g., Scale 1-10; mild, moderate, or severe)   - MILD (1-3): doesn't interfere with normal activities    - MODERATE (4-7): interferes with normal activities or awakens from sleep    - SEVERE (8-10): excruciating pain and patient unable to do normal activities (stays in bed)       7/10 4. PATTERN: "Does the pain come and go, or is it constant?"      constant 5. CAUSE: "What do you think is causing the pain?"  Kidney stone 6. OTHER SYMPTOMS:  "Do you have any other symptoms?" (e.g., fever, abdominal pain, vomiting, leg weakness, burning with urination, blood in urine)     No blood in urine; c/o left back and side pain; no fever; no N/V  Protocols used: FLANK PAIN-A-AH, BACK PAIN-A-AH

## 2018-11-13 NOTE — Patient Instructions (Signed)

## 2018-11-13 NOTE — Progress Notes (Signed)
BP 129/81   Pulse 86   Temp 98.3 F (36.8 C) (Oral)   Wt 236 lb 3.2 oz (107.1 kg)   SpO2 99%   BMI 31.16 kg/m    Subjective:    Patient ID: Stephen [name], male    DOB: 07-27-1966, 52 y.o.   MRN: [id number]  HPI: Stephen [name] is a 52 y.o. male  Chief Complaint  Patient presents with  . Back Pain    left side, started 2 days ago. Pt states he has had a kidney stone in the past and states this pain feels like that   URINARY SYMPTOMS Duration: 2 days Dysuria: no Urinary frequency: yes Urgency: yes Small volume voids: no Symptom severity: moderate Urinary incontinence: no Foul odor: no Hematuria: no Abdominal pain: no Back pain: yes Suprapubic pain/pressure: no Flank pain: yes Fever:  no Vomiting: no Relief with cranberry juice: no Relief with pyridium: no Status:worse Previous urinary tract infection: no Recurrent urinary tract infection: no History of sexually transmitted disease: no Penile discharge: no Treatments attempted: cranberry and increasing fluids   Relevant past medical, surgical, family and social history reviewed and updated as indicated. Interim medical history since our last visit reviewed. Allergies and medications reviewed and updated.  Review of Systems  Constitutional: Negative.   Respiratory: Negative.   Cardiovascular: Negative.   Gastrointestinal: Positive for abdominal pain. Negative for abdominal distention, anal bleeding, blood in stool, constipation, diarrhea, nausea, rectal pain and vomiting.  Genitourinary: Positive for flank pain, frequency and urgency. Negative for decreased urine volume, difficulty urinating, discharge, dysuria, enuresis, genital sores, hematuria, penile pain, penile swelling, scrotal swelling and testicular pain.  Musculoskeletal: Positive for back pain and myalgias. Negative for arthralgias, gait problem, joint swelling, neck pain and neck stiffness.  Psychiatric/Behavioral: Negative.     Per HPI  unless specifically indicated above     Objective:    BP 129/81   Pulse 86   Temp 98.3 F (36.8 C) (Oral)   Wt 236 lb 3.2 oz (107.1 kg)   SpO2 99%   BMI 31.16 kg/m   Wt Readings from Last 3 Encounters:  11/13/18 236 lb 3.2 oz (107.1 kg)  11/10/18 223 lb 12.8 oz (101.5 kg)  10/17/18 236 lb (107 kg)    Physical Exam  Constitutional: He is oriented to person, place, and time. He appears well-developed and well-nourished. No distress.  HENT:  Head: Normocephalic and atraumatic.  Right Ear: Hearing normal.  Left Ear: Hearing normal.  Nose: Nose normal.  Eyes: Conjunctivae and lids are normal. Right eye exhibits no discharge. Left eye exhibits no discharge. No scleral icterus.  Cardiovascular: Normal rate, regular rhythm, normal heart sounds and intact distal pulses. Exam reveals no gallop and no friction rub.  No murmur heard. Pulmonary/Chest: Effort normal and breath sounds normal. No stridor. No respiratory distress. He has no wheezes. He has no rales. He exhibits no tenderness.  Abdominal: Soft. Bowel sounds are normal. He exhibits no distension and no mass. There is no tenderness. There is CVA tenderness (Left). There is no rebound and no guarding. No hernia.  Musculoskeletal: Normal range of motion.  Neurological: He is alert and oriented to person, place, and time.  Skin: Skin is warm, dry and intact. Capillary refill takes less than 2 seconds. No rash noted. He is not diaphoretic. No erythema. No pallor.  Psychiatric: He has a normal mood and affect. His speech is normal and behavior is normal. Judgment and thought content normal. Cognition and  memory are normal.  Nursing note and vitals reviewed.   Results for orders placed or performed in visit on 10/17/18  Uric acid  Result Value Ref Range   Uric Acid 12.8 (H) 3.7 - 8.6 mg/dL  Comprehensive metabolic panel  Result Value Ref Range   Glucose 98 65 - 99 mg/dL   BUN 24 6 - 24 mg/dL   Creatinine, Ser 2.05 (H) 0.76 - 1.27  mg/dL   GFR calc non Af Amer 36 (L) >59 mL/min/1.73   GFR calc Af Amer 42 (L) >59 mL/min/1.73   BUN/Creatinine Ratio 12 9 - 20   Sodium 139 134 - 144 mmol/L   Potassium 3.9 3.5 - 5.2 mmol/L   Chloride 97 96 - 106 mmol/L   CO2 24 20 - 29 mmol/L   Calcium 9.7 8.7 - 10.2 mg/dL   Total Protein 7.2 6.0 - 8.5 g/dL   Albumin 3.9 3.5 - 5.5 g/dL   Globulin, Total 3.3 1.5 - 4.5 g/dL   Albumin/Globulin Ratio 1.2 1.2 - 2.2   Bilirubin Total 0.4 0.0 - 1.2 mg/dL   Alkaline Phosphatase 106 39 - 117 IU/L   AST 15 0 - 40 IU/L   ALT 16 0 - 44 IU/L      Assessment & Plan:   Problem List Items Addressed This Visit    None    Visit Diagnoses    Kidney stone    -  Primary   Will treat with pain medicine, flomax and cipro for empiric treatment. Warning signs discussed. If not better by Monday, let us know.    Relevant Medications   HYDROcodone-acetaminophen (NORCO) 10-325 MG tablet   Flank pain       2+ blood- will treat for kidney stone. Call with any concerns.    Relevant Orders   UA/M w/rflx Culture, Routine       Follow up plan: Return in about 4 weeks (around 12/11/2018).

## 2018-11-13 NOTE — Telephone Encounter (Signed)
Pt added to Dr Stephen [name] schedule stating he would be here

## 2018-11-13 NOTE — Telephone Encounter (Signed)
I can see him at 1:45 if he can get here.

## 2018-11-17 ENCOUNTER — Ambulatory Visit: Payer: Commercial Managed Care - PPO | Admitting: Family Medicine

## 2018-11-18 ENCOUNTER — Ambulatory Visit (INDEPENDENT_AMBULATORY_CARE_PROVIDER_SITE_OTHER): Payer: Commercial Managed Care - PPO | Admitting: Internal Medicine

## 2018-11-18 ENCOUNTER — Encounter: Payer: Self-pay | Admitting: Internal Medicine

## 2018-11-18 ENCOUNTER — Encounter: Payer: Commercial Managed Care - PPO | Admitting: Internal Medicine

## 2018-11-18 VITALS — BP 100/64 | HR 68 | Ht 73.5 in | Wt 237.5 lb

## 2018-11-18 DIAGNOSIS — I472 Ventricular tachycardia, unspecified: Secondary | ICD-10-CM

## 2018-11-18 DIAGNOSIS — I428 Other cardiomyopathies: Secondary | ICD-10-CM

## 2018-11-18 DIAGNOSIS — I5022 Chronic systolic (congestive) heart failure: Secondary | ICD-10-CM | POA: Diagnosis not present

## 2018-11-18 DIAGNOSIS — I493 Ventricular premature depolarization: Secondary | ICD-10-CM

## 2018-11-18 DIAGNOSIS — Z79899 Other long term (current) drug therapy: Secondary | ICD-10-CM

## 2018-11-18 DIAGNOSIS — Z95810 Presence of automatic (implantable) cardiac defibrillator: Secondary | ICD-10-CM

## 2018-11-18 LAB — CUP PACEART INCLINIC DEVICE CHECK
Battery Remaining Longevity: 132 mo
Brady Statistic RV Percent Paced: 0.07 %
Date Time Interrogation Session: 20191119171109
HighPow Impedance: 73 Ohm
Implantable Lead Location: 753860
Implantable Pulse Generator Implant Date: 20181231
Lead Channel Impedance Value: 342 Ohm
Lead Channel Impedance Value: 399 Ohm
Lead Channel Pacing Threshold Amplitude: 1.25 V
Lead Channel Pacing Threshold Pulse Width: 1 ms
MDC IDC LEAD IMPLANT DT: 20181231
MDC IDC MSMT BATTERY VOLTAGE: 3.05 V
MDC IDC MSMT LEADCHNL RV SENSING INTR AMPL: 7.125 mV
MDC IDC SET LEADCHNL RV PACING AMPLITUDE: 2.5 V
MDC IDC SET LEADCHNL RV PACING PULSEWIDTH: 1 ms
MDC IDC SET LEADCHNL RV SENSING SENSITIVITY: 0.3 mV

## 2018-11-18 MED ORDER — AMIODARONE HCL 200 MG PO TABS: 300.0000 mg | ORAL_TABLET | Freq: Every day | ORAL | Status: DC

## 2018-11-18 NOTE — Patient Instructions (Addendum)
Medication Instructions:  - Your physician has recommended you make the following change in your medication:   1) DECREASE amiodarone 200 mg- take 1 & 1/2 tablets (300 mg) by mouth once daily  If you need a refill on your cardiac medications before your next appointment, please call your pharmacy.   Lab work: - Your physician recommends that you have lab work today: TSH   If you have labs (blood work) drawn today and your tests are completely normal, you will receive your results only by: Marland Kitchen MyChart Message (if you have MyChart) OR . A paper copy in the mail If you have any lab test that is abnormal or we need to change your treatment, we will call you to review the results.  Testing/Procedures: - none ordered  Follow-Up: At [facility], you and your health needs are our priority.  As part of our continuing mission to provide you with exceptional heart care, we have created designated Provider Care Teams.  These Care Teams include your primary Cardiologist (physician) and Advanced Practice Providers (APPs -  Physician Assistants and Nurse Practitioners) who all work together to provide you with the care you need, when you need it. You will need a follow up appointment in 3 months with [name].  - Remote monitoring is used to monitor your Pacemaker of ICD from home. This monitoring reduces the number of office visits required to check your device to one time per year. It allows [place] to keep an eye on the functioning of your device to ensure it is working properly. You are scheduled for a device check from home on 01/07/19. You may send your transmission at any time that day. If you have a wireless device, the transmission will be sent automatically. After your physician reviews your transmission, you will receive a postcard with your next transmission date.   Any Other Special Instructions Will Be Listed Below (If Applicable). - N/A

## 2018-11-18 NOTE — Progress Notes (Signed)
Patient Care Team: Valerie Roys, DO as PCP - General (Family Medicine) [name], FNP as Nurse Practitioner (Family Medicine) Yolonda Kida, MD as Consulting Physician (Cardiology)   HPI  Stephen [name] is a 52 y.o. male Seen with a history of PVCs and a nonischemic cardiomyopathy and implanted with [employer] ICD.  He also has a history of a TIA and remote syncope.  Initial evaluation 12/18 ventricular ectopy was noted with the plan to use amiodarone for suppression of the ectopy and then reassessment of LV function  Unfortunately he then had a cardiac arrest from which he recovered; underwent ICD implantation 12/18  Intercurrently, syncope with VT/VF Mex resumed with amio  Amio.mexiletene failed to suppress PVC>> JA ablation 6/19. Poor result and referred to [facility] in [place]>>   The patient is intercurrently developed left bundle branch block; temporally relates to his ablation procedure   8/19 he was hospitalized at [facility] for defibrillator shocks in the context of potassium of 2.5..  I had reached out to [facility] for transfer.  The patient chose to transfer his care to [name].  There was no further contact with [facility].   He is here today.  He has been following up with [name] regularly. CHF clinic eval >>CPX "moderate to severe HF limitation"  Declined LVAD workup  He has modest shortness of breath scant edema and no nocturnal dyspnea.  No intercurrent syncope.  Problems with gout. , DATE TEST EF   10/18 Echo   25 % Mod LAE MR  12/18  cMRI  16 % Hypokinetic RV midwall gadolinium  7/19 Echo 10-15 %    Date Cr K TSH LFTs  4/19 1.38 3.9 1.73 (2/19) 13(2/19)   10/19 2.05 3.9  24       Regarding ablation, he would prefer to go to [facility] declining JA's recommendation for Dr. pH.  I reached out spoke with Dr. Omelia Blackwater and is given me Dr. Zettie Cooley name--the patient then declined     Records and [facility] Hospital records  Past Medical  History:  Diagnosis Date  . AICD (automatic cardioverter/defibrillator) present   . AKI (acute kidney injury) ([place]) 12/24/2017  . Arrhythmia   . Arthritis    "lower back, knees" (06/10/2018)  . Cardiac arrest ([place]) 12/23/2017   Brief V-fib arrest  . Chest pain 09/08/2017  . CHF (congestive heart failure) (HCC)    nonischemic cardiomyopathy, EF 25%  . Gout    "on daily RX" (06/10/2018)  . Headache    "q couple months" (06/10/2018)  . High cholesterol   . History of kidney stones   . Hypertension   . Influenza A 12/24/2017  . NICM (nonischemic cardiomyopathy) ([place])   . Pneumonia 12/20/2017  . TIA (transient ischemic attack) 06/21/2016   "still affects my memory a little bit" (06/10/2018)    Past Surgical History:  Procedure Laterality Date  . EXTERNAL FIXATION LEG Right ~ 2000   "was going bowlegged; had to brake my leg to fix it"  . HERNIA REPAIR    . ICD IMPLANT N/A 12/30/2017   Procedure: ICD IMPLANT;  Surgeon: Deboraha Sprang, MD;  Location: [place] [facility];  Service: Cardiovascular;  Laterality: N/A;  . LEFT HEART CATH AND CORONARY ANGIOGRAPHY N/A 09/09/2017   Procedure: LEFT HEART CATH AND CORONARY ANGIOGRAPHY;  Surgeon: Teodoro Spray, MD;  Location: [place] [facility];  Service: Cardiovascular;  Laterality: N/A;  . UMBILICAL HERNIA REPAIR  [place]  06/10/2018  . V TACH ABLATION N/A 06/10/2018   Procedure: V TACH ABLATION;  Surgeon: Thompson Grayer, MD;  Location: [place] [facility];  Service: Cardiovascular;  Laterality: N/A;  . VASECTOMY      Current Outpatient Medications  Medication Sig Dispense Refill  . acetaminophen (TYLENOL) 325 MG tablet Take 2 tablets (650 mg total) by mouth every 6 (six) hours as needed. (Patient taking differently: Take 325-650 mg by mouth every 6 (six) hours as needed for moderate pain. ) 60 tablet 0  . albuterol (PROVENTIL HFA;VENTOLIN HFA) 108 (90 Base) MCG/ACT inhaler Inhale 2 puffs into the lungs every 4 (four) hours as  needed for wheezing or shortness of breath. 1 Inhaler 0  . allopurinol (ZYLOPRIM) 100 MG tablet Take 2 tablets (200 mg total) by mouth daily. 60 tablet 6  . amiodarone (PACERONE) 200 MG tablet Take 2 tablets (400 mg total) by mouth daily. 180 tablet 0  . aspirin EC 81 MG EC tablet Take 1 tablet (81 mg total) by mouth daily. 90 tablet 3  . atorvastatin (LIPITOR) 40 MG tablet Take 80 mg by mouth daily at 6 PM.   6  . Camphor-Eucalyptus-Menthol (CHEST RUB) 4.8-1.2-2.6 % OINT Apply 1 application topically daily as needed (congestion).     . carvedilol (COREG) 3.125 MG tablet Take 1 tablet (3.125 mg total) by mouth 2 (two) times daily with a meal. 60 tablet 0  . ciprofloxacin (CIPRO) 500 MG tablet Take 1 tablet (500 mg total) by mouth 2 (two) times daily. (Patient taking differently: Take 500 mg by mouth as needed. ) 14 tablet 0  . clopidogrel (PLAVIX) 75 MG tablet Take 1 tablet (75 mg total) by mouth daily. 30 tablet 5  . colchicine 0.6 MG tablet Take 1 tablet (0.6 mg total) by mouth daily as needed (gout flare). 6 tablet 1  . diclofenac sodium (VOLTAREN) 1 % GEL Apply 2 g topically 4 (four) times daily as needed (pain).     . fluticasone (FLONASE) 50 MCG/ACT nasal spray Place 2 sprays into both nostrils daily. (Patient taking differently: Place 2 sprays into both nostrils daily as needed for allergies. ) 16 g 2  . fluticasone-salmeterol (ADVAIR HFA) 115-21 MCG/ACT inhaler Inhale 2 puffs into the lungs 2 (two) times daily. 1 Inhaler 12  . HYDROcodone-acetaminophen (NORCO) 10-325 MG tablet Take 1 tablet by mouth every 8 (eight) hours as needed for up to 7 days. 21 tablet 0  . meloxicam (MOBIC) 15 MG tablet Take 15 mg by mouth daily.    . Menthol, Topical Analgesic, (ICY HOT EX) Apply 1 application topically daily as needed (pain).    Marland Kitchen metolazone (ZAROXOLYN) 2.5 MG tablet Take 1 tablet (2.5 mg total) by mouth once daily TAKE 30 MINUTES PRIOR TO LASIX  11  . Multiple Vitamin (MULTIVITAMIN WITH MINERALS)  TABS tablet Take 1 tablet by mouth daily.    . nitroGLYCERIN (NITROSTAT) 0.4 MG SL tablet Place 1 tablet (0.4 mg total) under the tongue every 5 (five) minutes as needed for chest pain. 25 tablet 3  . potassium chloride SA (K-DUR,KLOR-CON) 20 MEQ tablet Take 1 tablet (20 mEq total) by mouth 2 (two) times daily. (Patient taking differently: Take 20 mEq by mouth 4 (four) times daily. ) 60 tablet 0  . sacubitril-valsartan (ENTRESTO) 24-26 MG Take 1 tablet by mouth 2 (two) times daily. 60 tablet 0  . sildenafil (REVATIO) 20 MG tablet Take by mouth as needed.     Marland Kitchen  tamsulosin (FLOMAX) 0.4 MG CAPS capsule Take 1 capsule (0.4 mg total) by mouth daily. 30 capsule 0  . Tiotropium Bromide Monohydrate (SPIRIVA RESPIMAT) 2.5 MCG/ACT AERS Inhale 1 puff into the lungs daily. 1 Inhaler 12  . torsemide (DEMADEX) 20 MG tablet Take 2 tablets (40 mg total) by mouth daily. 60 tablet 0   No current facility-administered medications for this visit.     Allergies  Allergen Reactions  . Lisinopril Cough      Review of Systems negative except from HPI and PMH  Physical Exam BP 100/64 (BP Location: Left Arm, Patient Position: Sitting, Cuff Size: Large)   Pulse 68   Ht 6' 1.5" (1.867 m)   Wt 237 lb 8 oz (107.7 kg)   BMI 30.91 kg/m  Well developed and nourished in no acute distress HENT normal Neck supple with JVP-flat Clear Regular rate and rhythm, no murmurs or gallops Abd-soft with active BS No Clubbing cyanosis edema Skin-warm and dry A & Oriented  Grossly normal sensory and motor function  ECG sinus at 68 27/21/54 LBBB  Cardiomyopathy-nonischemic  Congestive heart failure-class IIb-IIIa  Sleep disordered breathing and daytime somnolence question sleep apnea  LBBB- iatrogenic  High Risk Medication Surveillance  PVCs/nonsustained ventricular tachycardia-    The patient has left bundle branch block though the context of his cardiomyopathy.  This is new following his ablation.  There may  be a role for CRT upgrade.  Right now his functional status is stable at class IIb  Amiodarone may be aggravating his pre-existing  conduction system disease present is first-degree AV block  His single-chamber device unfortunately does not give [place] an opportunity to measure PVC burden.  But by his electrocardiogram and auscultation he is having scant ectopy; this may be responsible for his improved functional status  He is having problems with gout.  I suggested that he try to decrease his p.o. intake with meals and decrease his diuretics.  Need amiodarone surveillance laboratories

## 2018-11-19 LAB — TSH: TSH: 0.134 u[IU]/mL — ABNORMAL LOW (ref 0.450–4.500)

## 2018-11-20 ENCOUNTER — Telehealth: Payer: Self-pay | Admitting: Internal Medicine

## 2018-11-20 ENCOUNTER — Encounter: Payer: Commercial Managed Care - PPO | Admitting: *Deleted

## 2018-11-20 ENCOUNTER — Ambulatory Visit: Payer: Commercial Managed Care - PPO | Attending: Family | Admitting: Family

## 2018-11-20 ENCOUNTER — Encounter: Payer: Self-pay | Admitting: Family

## 2018-11-20 VITALS — BP 100/70 | HR 71 | Resp 18 | Ht 73.0 in | Wt 237.0 lb

## 2018-11-20 DIAGNOSIS — Z87891 Personal history of nicotine dependence: Secondary | ICD-10-CM | POA: Diagnosis not present

## 2018-11-20 DIAGNOSIS — I509 Heart failure, unspecified: Secondary | ICD-10-CM | POA: Diagnosis not present

## 2018-11-20 DIAGNOSIS — Z79899 Other long term (current) drug therapy: Secondary | ICD-10-CM | POA: Insufficient documentation

## 2018-11-20 DIAGNOSIS — I5022 Chronic systolic (congestive) heart failure: Secondary | ICD-10-CM

## 2018-11-20 DIAGNOSIS — E785 Hyperlipidemia, unspecified: Secondary | ICD-10-CM | POA: Diagnosis not present

## 2018-11-20 DIAGNOSIS — I130 Hypertensive heart and chronic kidney disease with heart failure and stage 1 through stage 4 chronic kidney disease, or unspecified chronic kidney disease: Secondary | ICD-10-CM | POA: Diagnosis present

## 2018-11-20 DIAGNOSIS — I10 Essential (primary) hypertension: Secondary | ICD-10-CM | POA: Insufficient documentation

## 2018-11-20 DIAGNOSIS — Z8673 Personal history of transient ischemic attack (TIA), and cerebral infarction without residual deficits: Secondary | ICD-10-CM | POA: Insufficient documentation

## 2018-11-20 DIAGNOSIS — J410 Simple chronic bronchitis: Secondary | ICD-10-CM

## 2018-11-20 DIAGNOSIS — Z9889 Other specified postprocedural states: Secondary | ICD-10-CM | POA: Insufficient documentation

## 2018-11-20 DIAGNOSIS — J449 Chronic obstructive pulmonary disease, unspecified: Secondary | ICD-10-CM | POA: Insufficient documentation

## 2018-11-20 DIAGNOSIS — N189 Chronic kidney disease, unspecified: Secondary | ICD-10-CM | POA: Diagnosis not present

## 2018-11-20 NOTE — Patient Instructions (Signed)
Continue weighing daily and call for an overnight weight gain of > 2 pounds or a weekly weight gain of >5 pounds. 

## 2018-11-20 NOTE — Progress Notes (Signed)
Daily Session Note  Patient Details  Name: Stephen [name] MRN: [id number] Date of Birth: October 14, 1966 Referring Provider:     Cardiac Rehab from 11/10/2018 in [facility]  Referring Provider  [facility]      Encounter Date: 11/20/2018  Check In: Session Check In - 11/20/18 0914      Check-In   Supervising physician immediately available to respond to emergencies  See telemetry face sheet for immediately available ER MD    Staff Present  Gerlene Burdock, RN, BSN;Annaka Cleaver Luan Pulling, MA, RCEP, CCRP, Exercise Physiologist;Jeanna Durrell BS, Exercise Physiologist;Joseph Tessie Fass RCP,RRT,BSRT    Medication changes reported      No    Fall or balance concerns reported     No    Warm-up and Cool-down  Performed on first and last piece of equipment    Resistance Training Performed  Yes    VAD Patient?  No    PAD/SET Patient?  No      Pain Assessment   Currently in Pain?  No/denies          Social History   Tobacco Use  Smoking Status Former Smoker  . Packs/day: 0.33  . Years: 33.00  . Pack years: 10.89  . Types: Cigarettes  . Last attempt to quit: 02/20/2016  . Years since quitting: 2.7  Smokeless Tobacco Never Used    Goals Met:  Exercise tolerated well Personal goals reviewed No report of cardiac concerns or symptoms Strength training completed today  Goals Unmet:  Not Applicable  Comments: First full day of exercise with returning!  Patient was oriented to gym and equipment including functions, settings, policies, and procedures.  Patient's individual exercise prescription and treatment plan were reviewed.  All starting workloads were established based on the results of the 6 minute walk test done at initial orientation visit.  The plan for exercise progression was also introduced and progression will be customized based on patient's performance and goals.    Dr. Emily Filbert is Medical Director for [facility] and [facility]  Pulmonary Rehabilitation.

## 2018-11-20 NOTE — Telephone Encounter (Signed)
Received Accomodation form from patient given to heather for [name] to complete.

## 2018-11-20 NOTE — Progress Notes (Signed)
Patient ID: Stephen [name], male    DOB: 11/20/1966, 52 y.o.   MRN: [id number]  HPI  Mr. Testerman is a 52 y/o male with a history of TIA, HTN, CKD, hyperlipidemia, cardiac arrest, gout, previous tobacco use and chronic heart failure.   Echo report from 06/30/18 reviewed and showed an EF of 10-15% along with mild MR. Echo done 05/21/17 showed an EF of 20-25% which is stable from previous echo which was done 06/22/16 with an EF of 20%, mild MR, no aortic stenosis or regurgitation.   Cardiac catheterization done 09/09/17 revealing normal coronary arteries.  Admitted 08/19/18 due to defibrillator shock due to hypokalemia and dehydration. Cardiology consult obtained. Device was checked. Discharged the following day.   Patient presents today for a follow-up visit although hasn't been seen since October 2018. He's been followed at the [facility] in [place] but walked into the office asking to get re-established with [place]. He presents with a chief complaint of minimal fatigue upon moderate exertion. He describes this as having been present for several years. He has associated shortness of breath, light-headedness and difficulty sleeping along with this. He denies any abdominal distention, palpitations, pedal edema, chest pain, cough or weight gain. Currently participating in cardiac rehab.  Past Medical History:  Diagnosis Date  . AICD (automatic cardioverter/defibrillator) present   . AKI (acute kidney injury) ([place]) 12/24/2017  . Arrhythmia   . Arthritis    "lower back, knees" (06/10/2018)  . Cardiac arrest ([place]) 12/23/2017   Brief V-fib arrest  . Chest pain 09/08/2017  . CHF (congestive heart failure) (HCC)    nonischemic cardiomyopathy, EF 25%  . Gout    "on daily RX" (06/10/2018)  . Headache    "q couple months" (06/10/2018)  . High cholesterol   . History of kidney stones   . Hypertension   . Influenza A 12/24/2017  . NICM (nonischemic cardiomyopathy) ([place])   . Pneumonia 12/20/2017  . TIA (transient  ischemic attack) 06/21/2016   "still affects my memory a little bit" (06/10/2018)   Past Surgical History:  Procedure Laterality Date  . EXTERNAL FIXATION LEG Right ~ 2000   "was going bowlegged; had to brake my leg to fix it"  . HERNIA REPAIR    . ICD IMPLANT N/A 12/30/2017   Procedure: ICD IMPLANT;  Surgeon: Deboraha Sprang, MD;  Location: [facility] [facility];  Service: Cardiovascular;  Laterality: N/A;  . LEFT HEART CATH AND CORONARY ANGIOGRAPHY N/A 09/09/2017   Procedure: LEFT HEART CATH AND CORONARY ANGIOGRAPHY;  Surgeon: Teodoro Spray, MD;  Location: [place] [facility];  Service: Cardiovascular;  Laterality: N/A;  . UMBILICAL HERNIA REPAIR  1990s  . V TACH ABLATION  06/10/2018  . V TACH ABLATION N/A 06/10/2018   Procedure: V TACH ABLATION;  Surgeon: Thompson Grayer, MD;  Location: [place] [facility];  Service: Cardiovascular;  Laterality: N/A;  . VASECTOMY      Family History  Problem Relation Age of Onset  . Hypertension Mother   . Heart failure Mother   . Hypertension Father   . CAD Father   . Heart attack Father     Social History   Tobacco Use  . Smoking status: Former Smoker    Packs/day: 0.33    Years: 33.00    Pack years: 10.89    Types: Cigarettes    Last attempt to quit: 02/20/2016    Years since quitting: 2.7  . Smokeless tobacco: Never Used  Substance Use Topics  .  Alcohol use: Yes    Alcohol/week: 0.0 standard drinks    Comment: 06/10/2018 "beer once/month; if that"    Allergies  Allergen Reactions  . Lisinopril Cough   Prior to Admission medications   Medication Sig Start Date End Date Taking? Authorizing Provider  acetaminophen (TYLENOL) 325 MG tablet Take 2 tablets (650 mg total) by mouth every 6 (six) hours as needed. Patient taking differently: Take 325-650 mg by mouth every 6 (six) hours as needed for moderate pain.  03/19/18  Yes Carrie Mew, MD  albuterol (PROVENTIL HFA;VENTOLIN HFA) 108 (90 Base) MCG/ACT inhaler Inhale 2 puffs into the  lungs every 4 (four) hours as needed for wheezing or shortness of breath. 09/19/18  Yes Johnson, Megan P, DO  allopurinol (ZYLOPRIM) 100 MG tablet Take 2 tablets (200 mg total) by mouth daily. 10/19/18  Yes Johnson, Megan P, DO  amiodarone (PACERONE) 200 MG tablet Take 1.5 tablets (300 mg total) by mouth daily. 11/18/18  Yes Deboraha Sprang, MD  aspirin EC 81 MG EC tablet Take 1 tablet (81 mg total) by mouth daily. 01/01/18  Yes Daune Perch, NP  atorvastatin (LIPITOR) 40 MG tablet Take 80 mg by mouth daily at 6 PM.  10/29/18  Yes [provider]  Camphor-Eucalyptus-Menthol (CHEST RUB) 4.8-1.2-2.6 % OINT Apply 1 application topically daily as needed (congestion).    Yes [provider]  carvedilol (COREG) 3.125 MG tablet Take 1 tablet (3.125 mg total) by mouth 2 (two) times daily with a meal. 07/06/18  Yes Wieting, Richard, MD  ciprofloxacin (CIPRO) 500 MG tablet Take 1 tablet (500 mg total) by mouth 2 (two) times daily. Patient taking differently: Take 500 mg by mouth as needed.  11/13/18  Yes Johnson, Megan P, DO  clopidogrel (PLAVIX) 75 MG tablet Take 1 tablet (75 mg total) by mouth daily. 01/30/18  Yes Bensimhon, Shaune Pascal, MD  colchicine 0.6 MG tablet Take 1 tablet (0.6 mg total) by mouth daily as needed (gout flare). 09/03/18  Yes Johnson, Megan P, DO  diclofenac sodium (VOLTAREN) 1 % GEL Apply 2 g topically 4 (four) times daily as needed (pain).    Yes [provider]  fluticasone (FLONASE) 50 MCG/ACT nasal spray Place 2 sprays into both nostrils daily. Patient taking differently: Place 2 sprays into both nostrils daily as needed for allergies.  06/20/17  Yes [name], MD  fluticasone-salmeterol (ADVAIR HFA) [phone] MCG/ACT inhaler Inhale 2 puffs into the lungs 2 (two) times daily. 08/12/18  Yes [name], MD  HYDROcodone-acetaminophen (NORCO) 10-325 MG tablet Take 1 tablet by mouth every 8 (eight) hours as needed for up to 7 days. 11/13/18 11/20/18 Yes Johnson,  Megan P, DO  meloxicam (MOBIC) 15 MG tablet Take 15 mg by mouth daily.   Yes [provider]  Menthol, Topical Analgesic, (ICY HOT EX) Apply 1 application topically daily as needed (pain).   Yes [provider]  metolazone (ZAROXOLYN) 2.5 MG tablet Take 1 tablet (2.5 mg total) by mouth once daily TAKE 30 MINUTES PRIOR TO LASIX 09/05/18  Yes [provider]  Multiple Vitamin (MULTIVITAMIN WITH MINERALS) TABS tablet Take 1 tablet by mouth daily.   Yes [provider]  nitroGLYCERIN (NITROSTAT) 0.4 MG SL tablet Place 1 tablet (0.4 mg total) under the tongue every 5 (five) minutes as needed for chest pain. 02/19/18  Yes Bensimhon, Shaune Pascal, MD  potassium chloride SA (K-DUR,KLOR-CON) 20 MEQ tablet Take 1 tablet (20 mEq total) by mouth 2 (two) times daily. Patient  taking differently: Take 20 mEq by mouth 4 (four) times daily.  07/06/18  Yes Wieting, Richard, MD  sacubitril-valsartan (ENTRESTO) 24-26 MG Take 1 tablet by mouth 2 (two) times daily. 07/06/18  Yes Wieting, Richard, MD  sildenafil (REVATIO) 20 MG tablet Take by mouth as needed.  10/29/18 10/29/19 Yes [provider]  tamsulosin (FLOMAX) 0.4 MG CAPS capsule Take 1 capsule (0.4 mg total) by mouth daily. 11/13/18  Yes Johnson, Megan P, DO  Tiotropium Bromide Monohydrate (SPIRIVA RESPIMAT) 2.5 MCG/ACT AERS Inhale 1 puff into the lungs daily. 08/12/18  Yes [name], MD  torsemide (DEMADEX) 20 MG tablet Take 2 tablets (40 mg total) by mouth daily. 07/06/18  Yes Loletha Grayer, MD    Review of Systems  Constitutional: Positive for fatigue (minimal). Negative for appetite change.  HENT: Negative for congestion, postnasal drip and sore throat.   Eyes: Negative.   Respiratory: Positive for shortness of breath. Negative for cough, chest tightness and wheezing.   Cardiovascular: Negative for chest pain and leg swelling.  Gastrointestinal: Negative for abdominal distention and constipation.  Endocrine: Negative.    Genitourinary: Negative.   Musculoskeletal: Positive for arthralgias (knees hurt) and back pain. Negative for neck pain.  Skin: Negative.   Allergic/Immunologic: Negative.   Neurological: Positive for light-headedness. Negative for dizziness.  Hematological: Negative for adenopathy. Does not bruise/bleed easily.  Psychiatric/Behavioral: Positive for sleep disturbance (not sleeping well). Negative for dysphoric mood. The patient is not nervous/anxious.    Vitals:   11/20/18 1108 11/20/18 1132  BP: (!) 84/71 100/70  Pulse: 71   Resp: 18   SpO2: 100%   Weight: 237 lb (107.5 kg)   Height: 6\' 1"  (1.854 m)    Wt Readings from Last 3 Encounters:  11/20/18 237 lb (107.5 kg)  11/18/18 237 lb 8 oz (107.7 kg)  11/13/18 236 lb 3.2 oz (107.1 kg)   Lab Results  Component Value Date   CREATININE 2.05 (H) 10/17/2018   CREATININE 2.04 (H) 09/19/2018   CREATININE 1.75 (H) 08/25/2018    Physical Exam  Constitutional: He is oriented to person, place, and time. He appears well-developed and well-nourished.  HENT:  Head: Normocephalic and atraumatic.  Neck: Normal range of motion. Neck supple.  Cardiovascular: Normal rate and regular rhythm.  Pulmonary/Chest: Effort normal. He has no wheezes. He has no rales.  Abdominal: Soft. He exhibits no distension. There is no tenderness.  Musculoskeletal: He exhibits no edema or tenderness.  Neurological: He is alert and oriented to person, place, and time.  Skin: Skin is warm and dry.  Psychiatric: He has a normal mood and affect. His behavior is normal. Thought content normal.  Nursing note and vitals reviewed.    Assessment & Plan:  1: Chronic heart failure with reduced ejection fraction-  - NYHA Class III - euvolemic - weighing daily and he was reminded to call for overnight weight gain of >2 pounds or a weekly weight gain of >5 pounds - not adding salt but does BBQ on a large cooker and does taste what he's cooking and does use salt when  he's cooking.  - reminded to closely follow a 2000mg  sodium diet - has had ablation and ICD implant since last here - says there's been discussion about LVAD (not interested) or transplant (would be interested); encouraged him to follow-up with [name] regarding this process as he says that he doesn't want to return to [place] - BP limits ability to tirate entresto and/ or carvedilol - saw  EP Caryl Comes) 11/18/18 - saw cardiology Clayborn Bigness) 10/29/18 - patient reports receiving his flu/ pneumonia vaccines for this season - BNP 06/30/18 was 549.0 - sleep study done 09/02/18  2: HTN-  - BP quite low initially (84/71) but improved after recheck with manual cuff (100/70) - BMP done 10/17/18 reviewed and shows sodium 139, potassium 3.9, creatinine 2.05 and GFR 42 - saw PCP ([place]) 11/13/18  3: COPD- - using inhalers - PFT's done 08/01/17 - saw pulmonologist (Kasa) 08/12/18  Patient did not bring his medications nor a list. Each medication was verbally reviewed with the patient and he was encouraged to bring the bottles to every visit to confirm accuracy of list.  Return in 2 months or sooner for any questions/problems before then.

## 2018-11-21 ENCOUNTER — Telehealth: Payer: Self-pay | Admitting: Internal Medicine

## 2018-11-21 NOTE — Telephone Encounter (Signed)
Notes recorded by Deboraha Sprang, MD on 11/21/2018 at 12:30 PM EST Please Inform Patient  Labs show low TSH; Please order FT4 and 3

## 2018-11-21 NOTE — Telephone Encounter (Signed)
I called [facility] and spoke with Cornelius Moras - requested that a free T3 & free T4 be added on to the patient's labs drawn on 11/18/18. She will place the request and let us know if this can't be processed.   Otherwise she will fax a form to be signed for add on testing.

## 2018-11-24 ENCOUNTER — Emergency Department
Admission: EM | Admit: 2018-11-24 | Discharge: 2018-11-24 | Disposition: A | Discharge status: Home / Self Care | Payer: Commercial Managed Care - PPO | Attending: Emergency Medicine | Admitting: Emergency Medicine

## 2018-11-24 ENCOUNTER — Emergency Department: Payer: Commercial Managed Care - PPO

## 2018-11-24 ENCOUNTER — Ambulatory Visit: Payer: Self-pay | Admitting: *Deleted

## 2018-11-24 ENCOUNTER — Encounter: Payer: Self-pay | Admitting: Physician Assistant

## 2018-11-24 DIAGNOSIS — J449 Chronic obstructive pulmonary disease, unspecified: Secondary | ICD-10-CM | POA: Diagnosis not present

## 2018-11-24 DIAGNOSIS — R6884 Jaw pain: Secondary | ICD-10-CM

## 2018-11-24 DIAGNOSIS — Z87891 Personal history of nicotine dependence: Secondary | ICD-10-CM | POA: Insufficient documentation

## 2018-11-24 DIAGNOSIS — I5022 Chronic systolic (congestive) heart failure: Secondary | ICD-10-CM | POA: Insufficient documentation

## 2018-11-24 DIAGNOSIS — I130 Hypertensive heart and chronic kidney disease with heart failure and stage 1 through stage 4 chronic kidney disease, or unspecified chronic kidney disease: Secondary | ICD-10-CM | POA: Insufficient documentation

## 2018-11-24 DIAGNOSIS — Z79899 Other long term (current) drug therapy: Secondary | ICD-10-CM | POA: Insufficient documentation

## 2018-11-24 DIAGNOSIS — Z7982 Long term (current) use of aspirin: Secondary | ICD-10-CM | POA: Insufficient documentation

## 2018-11-24 DIAGNOSIS — Z7902 Long term (current) use of antithrombotics/antiplatelets: Secondary | ICD-10-CM | POA: Diagnosis not present

## 2018-11-24 DIAGNOSIS — N183 Chronic kidney disease, stage 3 (moderate): Secondary | ICD-10-CM | POA: Diagnosis not present

## 2018-11-24 LAB — CBC WITH DIFFERENTIAL/PLATELET
Abs Immature Granulocytes: 0.05 10*3/uL (ref 0.00–0.07)
Basophils Absolute: 0.1 10*3/uL (ref 0.0–0.1)
Basophils Relative: 1 %
EOS ABS: 0.1 10*3/uL (ref 0.0–0.5)
EOS PCT: 1 %
HCT: 42.4 % (ref 39.0–52.0)
Hemoglobin: 14.1 g/dL (ref 13.0–17.0)
IMMATURE GRANULOCYTES: 1 %
Lymphocytes Relative: 12 %
Lymphs Abs: 1.3 10*3/uL (ref 0.7–4.0)
MCH: 28.7 pg (ref 26.0–34.0)
MCHC: 33.3 g/dL (ref 30.0–36.0)
MCV: 86.2 fL (ref 80.0–100.0)
MONOS PCT: 9 %
Monocytes Absolute: 0.9 10*3/uL (ref 0.1–1.0)
NEUTROS PCT: 76 %
Neutro Abs: 8.2 10*3/uL — ABNORMAL HIGH (ref 1.7–7.7)
Platelets: 277 10*3/uL (ref 150–400)
RBC: 4.92 MIL/uL (ref 4.22–5.81)
RDW: 15.5 % (ref 11.5–15.5)
WBC: 10.7 10*3/uL — ABNORMAL HIGH (ref 4.0–10.5)
nRBC: 0 % (ref 0.0–0.2)

## 2018-11-24 LAB — COMPREHENSIVE METABOLIC PANEL
ALT: 18 U/L (ref 0–44)
AST: 29 U/L (ref 15–41)
Albumin: 3.8 g/dL (ref 3.5–5.0)
Alkaline Phosphatase: 88 U/L (ref 38–126)
Anion gap: 13 (ref 5–15)
BILIRUBIN TOTAL: 1.1 mg/dL (ref 0.3–1.2)
BUN: 37 mg/dL — AB (ref 6–20)
CALCIUM: 9.9 mg/dL (ref 8.9–10.3)
CO2: 24 mmol/L (ref 22–32)
CREATININE: 2.36 mg/dL — AB (ref 0.61–1.24)
Chloride: 102 mmol/L (ref 98–111)
GFR calc Af Amer: 35 mL/min — ABNORMAL LOW (ref >60)
GFR calc non Af Amer: 30 mL/min — ABNORMAL LOW (ref >60)
Glucose, Bld: 118 mg/dL — ABNORMAL HIGH (ref 70–99)
POTASSIUM: 3.9 mmol/L (ref 3.5–5.1)
Sodium: 139 mmol/L (ref 135–145)
Total Protein: 7.6 g/dL (ref 6.5–8.1)

## 2018-11-24 LAB — TROPONIN I: TROPONIN I: 0.05 ng/mL — AB (ref <0.03)

## 2018-11-24 MED ORDER — CYCLOBENZAPRINE HCL 5 MG PO TABS: 5.0000 mg | ORAL_TABLET | Freq: Three times a day (TID) | ORAL | 0 refills | Status: DC | PRN

## 2018-11-24 MED ORDER — NAPROXEN 500 MG PO TABS: 500.0000 mg | ORAL_TABLET | Freq: Two times a day (BID) | ORAL | 0 refills | Status: DC

## 2018-11-24 MED ORDER — PROMETHAZINE HCL 12.5 MG PO TABS: 12.5000 mg | ORAL_TABLET | Freq: Four times a day (QID) | ORAL | 0 refills | Status: DC | PRN

## 2018-11-24 NOTE — ED Notes (Signed)
Date and time results received: 11/24/18 3:02 PM    Test: Troponin Critical Value: 0.05  Name of Provider Notified: Dr. Archie Balboa  Orders Received? Or Actions Taken?: Orders Received - See Orders for details

## 2018-11-24 NOTE — ED Provider Notes (Signed)
[facility] Emergency Department Provider Note   ____________________________________________   I have reviewed the triage vital signs and the nursing notes.   HISTORY  Chief Complaint Left jaw pain  History limited by: Not Limited   HPI Stephen [name] is a 52 y.o. male who presents to the emergency department today with primary complaint of left jaw pain.  He states pain started a few days ago.  He denies any trauma to his face or jaw.  He states that the pain is worse when he tries to chew.  He is having pain even when trying to chew grapes.  Is located just in front of his left ear.  He states that his ear is also having pain since the jaw pain started.  He denies having similar pain in the past.  In addition he states he is having some shortness of breath over the past couple of days.  He denies any associated chest pain or swelling.  Denies any fevers.  Per medical record review patient has a history of CHF, AICD, AKI, LBBB  Past Medical History:  Diagnosis Date  . AICD (automatic cardioverter/defibrillator) present   . AKI (acute kidney injury) ([place]) 12/24/2017  . Arrhythmia   . Arthritis    "lower back, knees" (06/10/2018)  . Cardiac arrest ([place]) 12/23/2017   Brief V-fib arrest  . Chest pain 09/08/2017  . CHF (congestive heart failure) (HCC)    nonischemic cardiomyopathy, EF 25%  . Gout    "on daily RX" (06/10/2018)  . Headache    "q couple months" (06/10/2018)  . High cholesterol   . History of kidney stones   . Hypertension   . Influenza A 12/24/2017  . NICM (nonischemic cardiomyopathy) ([place])   . Pneumonia 12/20/2017  . TIA (transient ischemic attack) 06/21/2016   "still affects my memory a little bit" (06/10/2018)    Patient Active Problem List   Diagnosis Date Noted  . HTN (hypertension) 11/20/2018  . Hypokalemia 08/19/2018  . Syncope 06/30/2018  . CKD (chronic kidney disease) stage 3, GFR 30-59 ml/min (HCC) 05/13/2018  .  Hyperlipidemia 05/13/2018  . ED (erectile dysfunction) 05/13/2018  . Osteoarthritis of right knee 04/30/2018  . Gout 03/26/2018  . VF (ventricular fibrillation) ([place])   . Nonischemic cardiomyopathy ([place])   . Cardiogenic shock ([place]) 12/24/2017  . 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 History of non-ST elevation myocardial infarction (NSTEMI) 09/08/2017  . Bradycardia 08/30/2017  . Ventricular tachycardia ([place]) 07/29/2017  . COPD (chronic obstructive pulmonary disease) ([place]) 07/04/2017  . TIA (transient ischemic attack) 06/22/2016  . Benign hypertensive renal disease 06/22/2016  . Acute on chronic systolic CHF (congestive heart failure) ([place]) 06/04/2016  . Chronic systolic congestive heart failure ([place]) 01/20/2016  . Cardiomyopathy ([place]) 01/20/2016    Past Surgical History:  Procedure Laterality Date  . EXTERNAL FIXATION LEG Right ~ 2000   "was going bowlegged; had to brake my leg to fix it"  . HERNIA REPAIR    . ICD IMPLANT N/A 12/30/2017   Procedure: ICD IMPLANT;  Surgeon: Deboraha Sprang, MD;  Location: [place] [facility];  Service: Cardiovascular;  Laterality: N/A;  . LEFT HEART CATH AND CORONARY ANGIOGRAPHY N/A 09/09/2017   Procedure: LEFT HEART CATH AND CORONARY ANGIOGRAPHY;  Surgeon: Teodoro Spray, MD;  Location: [place] [facility];  Service: Cardiovascular;  Laterality: N/A;  . UMBILICAL HERNIA REPAIR  1990s  . V TACH ABLATION  06/10/2018  . V TACH ABLATION N/A 06/10/2018  Procedure: V TACH ABLATION;  Surgeon: Thompson Grayer, MD;  Location: [place] [facility];  Service: Cardiovascular;  Laterality: N/A;  . VASECTOMY      Prior to Admission medications   Medication Sig Start Date End Date Taking? Authorizing Provider  acetaminophen (TYLENOL) 325 MG tablet Take 2 tablets (650 mg total) by mouth every 6 (six) hours as needed. Patient taking differently: Take 325-650 mg by mouth every 6 (six) hours as needed for moderate pain.  03/19/18   Carrie Mew, MD  albuterol (PROVENTIL  HFA;VENTOLIN HFA) 108 (90 Base) MCG/ACT inhaler Inhale 2 puffs into the lungs every 4 (four) hours as needed for wheezing or shortness of breath. 09/19/18   Johnson, Megan P, DO  allopurinol (ZYLOPRIM) 100 MG tablet Take 2 tablets (200 mg total) by mouth daily. 10/19/18   Johnson, Megan P, DO  amiodarone (PACERONE) 200 MG tablet Take 1.5 tablets (300 mg total) by mouth daily. 11/18/18   Deboraha Sprang, MD  aspirin EC 81 MG EC tablet Take 1 tablet (81 mg total) by mouth daily. 01/01/18   Daune Perch, NP  atorvastatin (LIPITOR) 40 MG tablet Take 80 mg by mouth daily at 6 PM.  10/29/18   [provider]  Camphor-Eucalyptus-Menthol (CHEST RUB) 4.8-1.2-2.6 % OINT Apply 1 application topically daily as needed (congestion).     [provider]  carvedilol (COREG) 3.125 MG tablet Take 1 tablet (3.125 mg total) by mouth 2 (two) times daily with a meal. 07/06/18   Leslye Peer, Richard, MD  ciprofloxacin (CIPRO) 500 MG tablet Take 1 tablet (500 mg total) by mouth 2 (two) times daily. Patient taking differently: Take 500 mg by mouth as needed.  11/13/18   [place] Liter P, DO  clopidogrel (PLAVIX) 75 MG tablet Take 1 tablet (75 mg total) by mouth daily. 01/30/18   Bensimhon, Shaune Pascal, MD  colchicine 0.6 MG tablet Take 1 tablet (0.6 mg total) by mouth daily as needed (gout flare). 09/03/18   [place] Liter P, DO  diclofenac sodium (VOLTAREN) 1 % GEL Apply 2 g topically 4 (four) times daily as needed (pain).     [provider]  fluticasone (FLONASE) 50 MCG/ACT nasal spray Place 2 sprays into both nostrils daily. Patient taking differently: Place 2 sprays into both nostrils daily as needed for allergies.  06/20/17   [name], MD  fluticasone-salmeterol (ADVAIR HFA) [phone] MCG/ACT inhaler Inhale 2 puffs into the lungs 2 (two) times daily. 08/12/18   [name], MD  meloxicam (MOBIC) 15 MG tablet Take 15 mg by mouth daily.    [provider]  Menthol, Topical Analgesic, (ICY HOT  EX) Apply 1 application topically daily as needed (pain).    [provider]  metolazone (ZAROXOLYN) 2.5 MG tablet Take 1 tablet (2.5 mg total) by mouth once daily TAKE 30 MINUTES PRIOR TO LASIX 09/05/18   [provider]  Multiple Vitamin (MULTIVITAMIN WITH MINERALS) TABS tablet Take 1 tablet by mouth daily.    [provider]  nitroGLYCERIN (NITROSTAT) 0.4 MG SL tablet Place 1 tablet (0.4 mg total) under the tongue every 5 (five) minutes as needed for chest pain. 02/19/18   Bensimhon, Shaune Pascal, MD  potassium chloride SA (K-DUR,KLOR-CON) 20 MEQ tablet Take 1 tablet (20 mEq total) by mouth 2 (two) times daily. Patient taking differently: Take 20 mEq by mouth 4 (four) times daily.  07/06/18   Loletha Grayer, MD  sacubitril-valsartan (ENTRESTO) 24-26 MG Take 1 tablet by mouth 2 (two) times daily.  07/06/18   Loletha Grayer, MD  sildenafil (REVATIO) 20 MG tablet Take by mouth as needed.  10/29/18 10/29/19  [provider]  tamsulosin (FLOMAX) 0.4 MG CAPS capsule Take 1 capsule (0.4 mg total) by mouth daily. 11/13/18   Johnson, Megan P, DO  Tiotropium Bromide Monohydrate (SPIRIVA RESPIMAT) 2.5 MCG/ACT AERS Inhale 1 puff into the lungs daily. 08/12/18   [name], MD  torsemide (DEMADEX) 20 MG tablet Take 2 tablets (40 mg total) by mouth daily. 07/06/18   Loletha Grayer, MD    Allergies Lisinopril  Family History  Problem Relation Age of Onset  . Hypertension Mother   . Heart failure Mother   . Hypertension Father   . CAD Father   . Heart attack Father     Social History Social History   Tobacco Use  . Smoking status: Former Smoker    Packs/day: 0.33    Years: 33.00    Pack years: 10.89    Types: Cigarettes    Last attempt to quit: 02/20/2016    Years since quitting: 2.7  . Smokeless tobacco: Never Used  Substance Use Topics  . Alcohol use: Yes    Alcohol/week: 0.0 standard drinks    Comment: 06/10/2018 "beer once/month; if that"  . Drug use: Never     Review of Systems Constitutional: No fever/chills Eyes: No visual changes. ENT: Positive for left jaw pain, left ear pain Cardiovascular: Denies chest pain. Respiratory: Positive for shortness of breath. Gastrointestinal: No abdominal pain.  No nausea, no vomiting.  No diarrhea.   Genitourinary: Negative for dysuria. Musculoskeletal: Negative for back pain. Skin: Negative for rash. Neurological: Negative for headaches, focal weakness or numbness.  ____________________________________________   PHYSICAL EXAM:  VITAL SIGNS: ED Triage Vitals  Enc Vitals Group     BP 11/24/18 1332 (!) 147/90     Pulse Rate 11/24/18 1332 (!) 104     Resp 11/24/18 1430 18     Temp 11/24/18 1332 98.4 F (36.9 C)     Temp Source 11/24/18 1332 Oral     SpO2 11/24/18 1332 96 %     Weight --      Height --      Head Circumference --      Peak Flow --      Pain Score 11/24/18 1332 0    Constitutional: Alert and oriented.  Eyes: Conjunctivae are normal.  ENT      Head: Normocephalic and atraumatic. Tender to palpation over the left TMJ.       Nose: No congestion/rhinnorhea.      Mouth/Throat: Mucous membranes are moist.      Neck: No stridor. Hematological/Lymphatic/Immunilogical: No cervical lymphadenopathy. Cardiovascular: Normal rate, regular rhythm.  Systolic murmur Respiratory: Normal respiratory effort without tachypnea nor retractions. Breath sounds are clear and equal bilaterally. No wheezes/rales/rhonchi. Gastrointestinal: Soft and non tender. No rebound. No guarding.  Genitourinary: Deferred Musculoskeletal: Normal range of motion in all extremities. No lower extremity edema. Neurologic:  Normal speech and language. No gross focal neurologic deficits are appreciated.  Skin:  Skin is warm, dry and intact. No rash noted. Psychiatric: Mood and affect are normal. Speech and behavior are normal. Patient exhibits appropriate insight and  judgment.  ____________________________________________    LABS (pertinent positives/negatives)  Trop 0.05 CMP na 139, k 3.9, glu 118, cr 2.36 CBC wbc 10.7, hgb 14.1, plt 277  ____________________________________________   EKG  I, Nance Pear, attending physician, personally viewed and interpreted this EKG  EKG Time: 1417  Rate: 89 Rhythm: normal sinus rhythm Axis: left axis deviation Intervals: qtc 593 QRS: LBBB ST changes: no st elevation equivalent Impression: abnormal ekg  ____________________________________________    RADIOLOGY  CXR Stable cardiomegaly, vascular congestion  ____________________________________________   PROCEDURES  Procedures  ____________________________________________   INITIAL IMPRESSION / ASSESSMENT AND PLAN / ED COURSE  Pertinent labs & imaging results that were available during my care of the patient were reviewed by me and considered in my medical decision making (see chart for details).   Patient presented to the emergency department today with left jaw pain.  The pain is worse during mastication.  Patient is tender over the left TMJ.  At this point I think TMJ inflammation is likely.  I discussed this with the patient.  Will plan on discharging with NSAIDs and muscle relaxer.  Terms of the shortness of breath patient's chest x-ray without any acute findings.  Patient's EKG while not normal does not appear significantly changed from previous.  Patient's troponin mildly elevated however he patient has some chronic elevation per chart review.  Did discuss that he should follow-up with ENT.  ____________________________________________   FINAL CLINICAL IMPRESSION(S) / ED DIAGNOSES  Final diagnoses:  Jaw pain     Note: This dictation was prepared with Dragon dictation. Any transcriptional errors that result from this process are unintentional     Nance Pear, MD 11/24/18 1556

## 2018-11-24 NOTE — Discharge Instructions (Addendum)
Please seek medical attention for any high fevers, chest pain, shortness of breath, change in behavior, persistent vomiting, bloody stool or any other new or concerning symptoms.  

## 2018-11-24 NOTE — ED Triage Notes (Signed)
Pt presents for HA and ear pain sore throat.  Pt states it started 3 days ago. Pt is NAD at this time

## 2018-11-24 NOTE — ED Notes (Signed)
Pt moved to room 30  Report given to [facility]

## 2018-11-24 NOTE — ED Provider Notes (Signed)
[facility] Emergency Department Provider Note  ____________________________________________   First MD Initiated Contact with Patient 11/24/18 1413     (approximate)  I have reviewed the triage vital signs and the nursing notes.   HISTORY  Chief Complaint No chief complaint on file.    HPI Stephen [name] is a 52 y.o. male complains of sore throat left ear pain along with shortness of breath.  He states he had recent cardiac work-up and a defibrillator "adjustment  "at the cardiologist office.  He states he gets sick every time the do something to his defibrillator.  He has had some vomiting but denies diarrhea.  He states he last vomited prior to entering the ED.  The last time he was admitted he was admitted for hypokalemia and acute renal failure.   Past Medical History:  Diagnosis Date  . AICD (automatic cardioverter/defibrillator) present   . AKI (acute kidney injury) ([place]) 12/24/2017  . Arrhythmia   . Arthritis    "lower back, knees" (06/10/2018)  . Cardiac arrest ([place]) 12/23/2017   Brief V-fib arrest  . Chest pain 09/08/2017  . CHF (congestive heart failure) (HCC)    nonischemic cardiomyopathy, EF 25%  . Gout    "on daily RX" (06/10/2018)  . Headache    "q couple months" (06/10/2018)  . High cholesterol   . History of kidney stones   . Hypertension   . Influenza A 12/24/2017  . NICM (nonischemic cardiomyopathy) ([place])   . Pneumonia 12/20/2017  . TIA (transient ischemic attack) 06/21/2016   "still affects my memory a little bit" (06/10/2018)    Patient Active Problem List   Diagnosis Date Noted  . HTN (hypertension) 11/20/2018  . Hypokalemia 08/19/2018  . Syncope 06/30/2018  . CKD (chronic kidney disease) stage 3, GFR 30-59 ml/min (HCC) 05/13/2018  . Hyperlipidemia 05/13/2018  . ED (erectile dysfunction) 05/13/2018  . Osteoarthritis of right knee 04/30/2018  . Gout 03/26/2018  . VF (ventricular fibrillation) ([place])   . Nonischemic  cardiomyopathy ([place])   . Cardiogenic shock ([place]) 12/24/2017  . 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 History of non-ST elevation myocardial infarction (NSTEMI) 09/08/2017  . Bradycardia 08/30/2017  . Ventricular tachycardia ([place]) 07/29/2017  . COPD (chronic obstructive pulmonary disease) ([place]) 07/04/2017  . TIA (transient ischemic attack) 06/22/2016  . Benign hypertensive renal disease 06/22/2016  . Acute on chronic systolic CHF (congestive heart failure) ([place]) 06/04/2016  . Chronic systolic congestive heart failure ([place]) 01/20/2016  . Cardiomyopathy ([place]) 01/20/2016    Past Surgical History:  Procedure Laterality Date  . EXTERNAL FIXATION LEG Right ~ 2000   "was going bowlegged; had to brake my leg to fix it"  . HERNIA REPAIR    . ICD IMPLANT N/A 12/30/2017   Procedure: ICD IMPLANT;  Surgeon: Deboraha Sprang, MD;  Location: [place] [facility];  Service: Cardiovascular;  Laterality: N/A;  . LEFT HEART CATH AND CORONARY ANGIOGRAPHY N/A 09/09/2017   Procedure: LEFT HEART CATH AND CORONARY ANGIOGRAPHY;  Surgeon: Teodoro Spray, MD;  Location: [place] [facility];  Service: Cardiovascular;  Laterality: N/A;  . UMBILICAL HERNIA REPAIR  1990s  . V TACH ABLATION  06/10/2018  . V TACH ABLATION N/A 06/10/2018   Procedure: V TACH ABLATION;  Surgeon: Thompson Grayer, MD;  Location: [place] [facility];  Service: Cardiovascular;  Laterality: N/A;  . VASECTOMY      Prior to Admission medications   Medication Sig Start Date End Date Taking? Authorizing Provider  acetaminophen (TYLENOL) 325 MG tablet Take 2 tablets (650 mg total) by mouth every 6 (six) hours as needed. Patient taking differently: Take 325-650 mg by mouth every 6 (six) hours as needed for moderate pain.  03/19/18   Carrie Mew, MD  albuterol (PROVENTIL HFA;VENTOLIN HFA) 108 (90 Base) MCG/ACT inhaler Inhale 2 puffs into the lungs every 4 (four) hours as needed for wheezing or shortness of breath. 09/19/18   Johnson, Megan P, DO  allopurinol  (ZYLOPRIM) 100 MG tablet Take 2 tablets (200 mg total) by mouth daily. 10/19/18   Johnson, Megan P, DO  amiodarone (PACERONE) 200 MG tablet Take 1.5 tablets (300 mg total) by mouth daily. 11/18/18   Deboraha Sprang, MD  aspirin EC 81 MG EC tablet Take 1 tablet (81 mg total) by mouth daily. 01/01/18   Daune Perch, NP  atorvastatin (LIPITOR) 40 MG tablet Take 80 mg by mouth daily at 6 PM.  10/29/18   [provider]  Camphor-Eucalyptus-Menthol (CHEST RUB) 4.8-1.2-2.6 % OINT Apply 1 application topically daily as needed (congestion).     [provider]  carvedilol (COREG) 3.125 MG tablet Take 1 tablet (3.125 mg total) by mouth 2 (two) times daily with a meal. 07/06/18   Leslye Peer, Richard, MD  ciprofloxacin (CIPRO) 500 MG tablet Take 1 tablet (500 mg total) by mouth 2 (two) times daily. Patient taking differently: Take 500 mg by mouth as needed.  11/13/18   [place] Liter P, DO  clopidogrel (PLAVIX) 75 MG tablet Take 1 tablet (75 mg total) by mouth daily. 01/30/18   Bensimhon, Shaune Pascal, MD  colchicine 0.6 MG tablet Take 1 tablet (0.6 mg total) by mouth daily as needed (gout flare). 09/03/18   [place] Liter P, DO  diclofenac sodium (VOLTAREN) 1 % GEL Apply 2 g topically 4 (four) times daily as needed (pain).     [provider]  fluticasone (FLONASE) 50 MCG/ACT nasal spray Place 2 sprays into both nostrils daily. Patient taking differently: Place 2 sprays into both nostrils daily as needed for allergies.  06/20/17   [name], MD  fluticasone-salmeterol (ADVAIR HFA) [phone] MCG/ACT inhaler Inhale 2 puffs into the lungs 2 (two) times daily. 08/12/18   [name], MD  meloxicam (MOBIC) 15 MG tablet Take 15 mg by mouth daily.    [provider]  Menthol, Topical Analgesic, (ICY HOT EX) Apply 1 application topically daily as needed (pain).    [provider]  metolazone (ZAROXOLYN) 2.5 MG tablet Take 1 tablet (2.5 mg total) by mouth once daily TAKE 30 MINUTES  PRIOR TO LASIX 09/05/18   [provider]  Multiple Vitamin (MULTIVITAMIN WITH MINERALS) TABS tablet Take 1 tablet by mouth daily.    [provider]  nitroGLYCERIN (NITROSTAT) 0.4 MG SL tablet Place 1 tablet (0.4 mg total) under the tongue every 5 (five) minutes as needed for chest pain. 02/19/18   Bensimhon, Shaune Pascal, MD  potassium chloride SA (K-DUR,KLOR-CON) 20 MEQ tablet Take 1 tablet (20 mEq total) by mouth 2 (two) times daily. Patient taking differently: Take 20 mEq by mouth 4 (four) times daily.  07/06/18   Loletha Grayer, MD  sacubitril-valsartan (ENTRESTO) 24-26 MG Take 1 tablet by mouth 2 (two) times daily. 07/06/18   Loletha Grayer, MD  sildenafil (REVATIO) 20 MG tablet Take by mouth as needed.  10/29/18 10/29/19  [provider]  tamsulosin (FLOMAX) 0.4 MG CAPS capsule Take 1 capsule (0.4 mg total) by mouth daily. 11/13/18   [place] Liter  P, DO  Tiotropium Bromide Monohydrate (SPIRIVA RESPIMAT) 2.5 MCG/ACT AERS Inhale 1 puff into the lungs daily. 08/12/18   [name], MD  torsemide (DEMADEX) 20 MG tablet Take 2 tablets (40 mg total) by mouth daily. 07/06/18   Loletha Grayer, MD    Allergies Lisinopril  Family History  Problem Relation Age of Onset  . Hypertension Mother   . Heart failure Mother   . Hypertension Father   . CAD Father   . Heart attack Father     Social History Social History   Tobacco Use  . Smoking status: Former Smoker    Packs/day: 0.33    Years: 33.00    Pack years: 10.89    Types: Cigarettes    Last attempt to quit: 02/20/2016    Years since quitting: 2.7  . Smokeless tobacco: Never Used  Substance Use Topics  . Alcohol use: Yes    Alcohol/week: 0.0 standard drinks    Comment: 06/10/2018 "beer once/month; if that"  . Drug use: Never    Review of Systems  Constitutional: No fever/chills Eyes: No visual changes. ENT: No sore throat.  Positive throat and ear pain Respiratory: Positive for cough and shortness of  breath Cardiovascular: Positive shortness of breath, denies chest pain, does have a defibrillator, denies any swelling of the extremities Genitourinary: Negative for dysuria. Musculoskeletal: Negative for back pain. Skin: Negative for rash.    ____________________________________________   PHYSICAL EXAM:  VITAL SIGNS: ED Triage Vitals [11/24/18 1332]  Enc Vitals Group     BP (!) 147/90     Pulse Rate (!) 104     Resp      Temp 98.4 F (36.9 C)     Temp Source Oral     SpO2 96 %     Weight      Height      Head Circumference      Peak Flow      Pain Score 0     Pain Loc      Pain Edu?      Excl. in [place]?     Constitutional: Alert and oriented. Well appearing and in no acute distress. Eyes: Conjunctivae are normal.  Head: Atraumatic. Ears: TMs with minimal fluid bilaterally Nose: No congestion/rhinnorhea. Mouth/Throat: Mucous membranes are moist.   Neck:  supple no lymphadenopathy noted Cardiovascular: Normal rate, regular rhythm. Heart sounds are normal Respiratory: Normal respiratory effort.  No retractions, lungs c t a  GU: deferred Musculoskeletal: FROM all extremities, warm and well perfused, no pedal edema is noted Neurologic:  Normal speech and language.  Skin:  Skin is warm, dry and intact. No rash noted. Psychiatric: Mood and affect are normal. Speech and behavior are normal.  ____________________________________________   LABS (all labs ordered are listed, but only abnormal results are displayed)  Labs Reviewed  CBC WITH DIFFERENTIAL/PLATELET  COMPREHENSIVE METABOLIC PANEL  TROPONIN I   ____________________________________________   ____________________________________________  RADIOLOGY  Chest x-ray  ____________________________________________   PROCEDURES  Procedure(s) performed: No  Procedures    ____________________________________________   INITIAL IMPRESSION / ASSESSMENT AND PLAN / ED COURSE  Pertinent labs & imaging  results that were available during my care of the patient were reviewed by me and considered in my medical decision making (see chart for details).   Patient is 52 year old male presents emergency department with complaints of vomiting and shortness of breath.  He has a defibrillator which his cardiologist evaluated 2 days ago.  He states every time they evaluate  the defibrillator he gets sick.  He last vomited prior to entering the ED.  On physical exam the patient appears very well.  Lungs are clear to auscultation, heart sounds are normal, no extremity edema is noted.  ENT is basically normal.   Chest x-ray, EKG, troponin, comprehensive metabolic panel and CBC were ordered.  Due to the patient already having a defibrillator, he was transferred to emergency pods that he be placed on the monitor.  Report was given to Dr. Archie Balboa.  As part of my medical decision making, I reviewed the following data within the [facility] notes reviewed and incorporated, Old chart reviewed, Patient signed out to Dr. Archie Balboa, Evaluated by EM attending Dr. Archie Balboa, Notes from prior ED visits and  Controlled Substance Database  ____________________________________________   FINAL CLINICAL IMPRESSION(S) / ED DIAGNOSES  Final diagnoses:  None      NEW MEDICATIONS STARTED DURING THIS VISIT:  New Prescriptions   No medications on file     Note:  This document was prepared using Dragon voice recognition software and may include unintentional dictation errors.    Versie Starks, PA-C 11/24/18 1417    Nance Pear, MD 11/24/18 [phone]

## 2018-11-24 NOTE — ED Notes (Addendum)
See triage note  Presents with sore throat and pain into left ear  Some SOB with vomiting  Denies any chest but has had recent cardiac work up and has a Radio producer

## 2018-11-24 NOTE — Telephone Encounter (Signed)
Called and left a message letting patient know what Dr.Johnson said.  

## 2018-11-24 NOTE — ED Notes (Signed)
Pt c/o intermittent HA, emesis, sore throat, SOB. Pt states SOB started today, other symptoms x 1 week. Pt states HA moves around, sore throat is from vomiting, states has only been able to keep down OJ in the last few days. Pt A&O, skin warm, dry, and intact. Pt states pacer/def to L upper chest.

## 2018-11-24 NOTE — Telephone Encounter (Signed)
Routing to provider  

## 2018-11-24 NOTE — Telephone Encounter (Signed)
Patient is calling with ear pain, headcahe and sinus symptoms.  Reason for Disposition . [1] SEVERE pain AND [2] not improved 2 hours after taking analgesic medication (e.g., ibuprofen or acetaminophen)    Patient has called and made appointment for Wednesday- advised to be seen before Wednesday- he may go to UC- he does not want to cancel appointment.  Answer Assessment - Initial Assessment Questions 1. LOCATION: "Which ear is involved?"     Left 2. ONSET: "When did the ear start hurting"      4 days ago 3. SEVERITY: "How bad is the pain?"  (Scale 1-10; mild, moderate or severe)   - MILD (1-3): doesn't interfere with normal activities    - MODERATE (4-7): interferes with normal activities or awakens from sleep    - SEVERE (8-10): excruciating pain, unable to do any normal activities      7 4. URI SYMPTOMS: " Do you have a runny nose or cough?"     Runny nose- cough 5. FEVER: "Do you have a fever?" If so, ask: "What is your temperature, how was it measured, and when did it start?"     No fever 6. CAUSE: "Have you been swimming recently?", "How often do you use Q-TIPS?", "Have you had any recent air travel or scuba diving?"     no 7. OTHER SYMPTOMS: "Do you have any other symptoms?" (e.g., headache, stiff neck, dizziness, vomiting, runny nose, decreased hearing)     Headache, vomiting, sinus symptoms 8. PREGNANCY: "Is there any chance you are pregnant?" "When was your last menstrual period?"     n/a  Protocols used: EARACHE-A-AH

## 2018-11-24 NOTE — Telephone Encounter (Signed)
Thank you for FYI.  If it is severe I would prefer he get seen sooner in urgent care or ER.

## 2018-11-26 ENCOUNTER — Ambulatory Visit: Payer: Commercial Managed Care - PPO | Admitting: Nurse Practitioner

## 2018-11-26 ENCOUNTER — Telehealth: Payer: Self-pay | Admitting: Internal Medicine

## 2018-11-26 ENCOUNTER — Encounter

## 2018-11-26 NOTE — Telephone Encounter (Signed)
Received records request from [employer] , forwarded to [facility] for processing.

## 2018-11-28 LAB — T3, FREE: T3, Free: 2.6 pg/mL (ref 2.0–4.4)

## 2018-11-28 LAB — T4, FREE: Free T4: 2.51 ng/dL — ABNORMAL HIGH (ref 0.82–1.77)

## 2018-11-28 LAB — SPECIMEN STATUS REPORT

## 2018-12-02 ENCOUNTER — Encounter: Payer: Commercial Managed Care - PPO | Attending: Internal Medicine

## 2018-12-02 DIAGNOSIS — I5022 Chronic systolic (congestive) heart failure: Secondary | ICD-10-CM | POA: Insufficient documentation

## 2018-12-02 DIAGNOSIS — R0603 Acute respiratory distress: Secondary | ICD-10-CM | POA: Diagnosis not present

## 2018-12-02 DIAGNOSIS — I130 Hypertensive heart and chronic kidney disease with heart failure and stage 1 through stage 4 chronic kidney disease, or unspecified chronic kidney disease: Secondary | ICD-10-CM | POA: Diagnosis not present

## 2018-12-02 NOTE — Progress Notes (Signed)
Daily Session Note  Patient Details  Name: Stephen [name] MRN: [id number] Date of Birth: 01/18/1966 Referring Provider:     Cardiac Rehab from 11/10/2018 in [facility]  Referring Provider  [place]      Encounter Date: 12/02/2018  Check In: Session Check In - 12/02/18 0916      Check-In   Supervising physician immediately available to respond to emergencies  See telemetry face sheet for immediately available ER MD    Location  [facility]    Staff Present  Susanne Bice, RN, BSN, CCRP;Jessica Hawkins, MA, RCEP, CCRP, Exercise Physiologist;  BS, Exercise Physiologist    Medication changes reported      No    Fall or balance concerns reported     No    Warm-up and Cool-down  Performed on first and last piece of equipment    Resistance Training Performed  Yes    VAD Patient?  No      Pain Assessment   Currently in Pain?  No/denies          Social History   Tobacco Use  Smoking Status Former Smoker  . Packs/day: 0.33  . Years: 33.00  . Pack years: 10.89  . Types: Cigarettes  . Last attempt to quit: 02/20/2016  . Years since quitting: 2.7  Smokeless Tobacco Never Used    Goals Met:  Independence with exercise equipment Exercise tolerated well No report of cardiac concerns or symptoms Strength training completed today  Goals Unmet:  Not Applicable  Comments: Pt able to follow exercise prescription today without complaint.  Will continue to monitor for progression. Patient presented with low BP 82/60, given 2 cups of water, re-check and monitor.   Dr. Mark Miller is Medical Director for [facility] and [facility]. 

## 2018-12-03 ENCOUNTER — Other Ambulatory Visit: Payer: Self-pay

## 2018-12-03 ENCOUNTER — Inpatient Hospital Stay
Admission: EM | Admit: 2018-12-03 | Discharge: 2018-12-10 | DRG: 291 | Disposition: A | Discharge status: Home / Self Care | Payer: Commercial Managed Care - PPO | Attending: Internal Medicine | Admitting: Internal Medicine

## 2018-12-03 ENCOUNTER — Encounter: Payer: Self-pay | Admitting: *Deleted

## 2018-12-03 ENCOUNTER — Emergency Department: Payer: Commercial Managed Care - PPO

## 2018-12-03 DIAGNOSIS — J440 Chronic obstructive pulmonary disease with acute lower respiratory infection: Secondary | ICD-10-CM | POA: Diagnosis present

## 2018-12-03 DIAGNOSIS — Z8674 Personal history of sudden cardiac arrest: Secondary | ICD-10-CM | POA: Diagnosis not present

## 2018-12-03 DIAGNOSIS — T462X5A Adverse effect of other antidysrhythmic drugs, initial encounter: Secondary | ICD-10-CM | POA: Diagnosis not present

## 2018-12-03 DIAGNOSIS — N179 Acute kidney failure, unspecified: Secondary | ICD-10-CM | POA: Diagnosis present

## 2018-12-03 DIAGNOSIS — J810 Acute pulmonary edema: Secondary | ICD-10-CM

## 2018-12-03 DIAGNOSIS — J189 Pneumonia, unspecified organism: Secondary | ICD-10-CM | POA: Diagnosis present

## 2018-12-03 DIAGNOSIS — N183 Chronic kidney disease, stage 3 (moderate): Secondary | ICD-10-CM | POA: Diagnosis present

## 2018-12-03 DIAGNOSIS — I69311 Memory deficit following cerebral infarction: Secondary | ICD-10-CM

## 2018-12-03 DIAGNOSIS — I130 Hypertensive heart and chronic kidney disease with heart failure and stage 1 through stage 4 chronic kidney disease, or unspecified chronic kidney disease: Principal | ICD-10-CM | POA: Diagnosis present

## 2018-12-03 DIAGNOSIS — E876 Hypokalemia: Secondary | ICD-10-CM | POA: Diagnosis present

## 2018-12-03 DIAGNOSIS — I952 Hypotension due to drugs: Secondary | ICD-10-CM | POA: Diagnosis not present

## 2018-12-03 DIAGNOSIS — Y92230 Patient room in hospital as the place of occurrence of the external cause: Secondary | ICD-10-CM | POA: Diagnosis not present

## 2018-12-03 DIAGNOSIS — Z87891 Personal history of nicotine dependence: Secondary | ICD-10-CM

## 2018-12-03 DIAGNOSIS — I4581 Long QT syndrome: Secondary | ICD-10-CM | POA: Diagnosis present

## 2018-12-03 DIAGNOSIS — A419 Sepsis, unspecified organism: Secondary | ICD-10-CM | POA: Diagnosis present

## 2018-12-03 DIAGNOSIS — J9600 Acute respiratory failure, unspecified whether with hypoxia or hypercapnia: Secondary | ICD-10-CM | POA: Diagnosis not present

## 2018-12-03 DIAGNOSIS — Y95 Nosocomial condition: Secondary | ICD-10-CM | POA: Diagnosis present

## 2018-12-03 DIAGNOSIS — M109 Gout, unspecified: Secondary | ICD-10-CM | POA: Diagnosis present

## 2018-12-03 DIAGNOSIS — Z7902 Long term (current) use of antithrombotics/antiplatelets: Secondary | ICD-10-CM

## 2018-12-03 DIAGNOSIS — I248 Other forms of acute ischemic heart disease: Secondary | ICD-10-CM | POA: Diagnosis present

## 2018-12-03 DIAGNOSIS — E785 Hyperlipidemia, unspecified: Secondary | ICD-10-CM | POA: Diagnosis present

## 2018-12-03 DIAGNOSIS — Z95810 Presence of automatic (implantable) cardiac defibrillator: Secondary | ICD-10-CM

## 2018-12-03 DIAGNOSIS — E0580 Other thyrotoxicosis without thyrotoxic crisis or storm: Secondary | ICD-10-CM | POA: Diagnosis not present

## 2018-12-03 DIAGNOSIS — R0602 Shortness of breath: Secondary | ICD-10-CM

## 2018-12-03 DIAGNOSIS — Z888 Allergy status to other drugs, medicaments and biological substances status: Secondary | ICD-10-CM

## 2018-12-03 DIAGNOSIS — Z8249 Family history of ischemic heart disease and other diseases of the circulatory system: Secondary | ICD-10-CM

## 2018-12-03 DIAGNOSIS — R0603 Acute respiratory distress: Secondary | ICD-10-CM | POA: Diagnosis present

## 2018-12-03 DIAGNOSIS — I5022 Chronic systolic (congestive) heart failure: Secondary | ICD-10-CM

## 2018-12-03 DIAGNOSIS — I420 Dilated cardiomyopathy: Secondary | ICD-10-CM | POA: Diagnosis present

## 2018-12-03 DIAGNOSIS — I5023 Acute on chronic systolic (congestive) heart failure: Secondary | ICD-10-CM | POA: Diagnosis present

## 2018-12-03 DIAGNOSIS — I252 Old myocardial infarction: Secondary | ICD-10-CM

## 2018-12-03 DIAGNOSIS — I447 Left bundle-branch block, unspecified: Secondary | ICD-10-CM | POA: Diagnosis present

## 2018-12-03 DIAGNOSIS — Z7982 Long term (current) use of aspirin: Secondary | ICD-10-CM

## 2018-12-03 DIAGNOSIS — I509 Heart failure, unspecified: Secondary | ICD-10-CM

## 2018-12-03 DIAGNOSIS — Z791 Long term (current) use of non-steroidal anti-inflammatories (NSAID): Secondary | ICD-10-CM

## 2018-12-03 DIAGNOSIS — J9601 Acute respiratory failure with hypoxia: Secondary | ICD-10-CM | POA: Diagnosis present

## 2018-12-03 DIAGNOSIS — Z7951 Long term (current) use of inhaled steroids: Secondary | ICD-10-CM

## 2018-12-03 DIAGNOSIS — M1711 Unilateral primary osteoarthritis, right knee: Secondary | ICD-10-CM | POA: Diagnosis present

## 2018-12-03 DIAGNOSIS — R1110 Vomiting, unspecified: Secondary | ICD-10-CM

## 2018-12-03 DIAGNOSIS — Z87442 Personal history of urinary calculi: Secondary | ICD-10-CM

## 2018-12-03 DIAGNOSIS — Z9852 Vasectomy status: Secondary | ICD-10-CM

## 2018-12-03 DIAGNOSIS — T501X5A Adverse effect of loop [high-ceiling] diuretics, initial encounter: Secondary | ICD-10-CM | POA: Diagnosis not present

## 2018-12-03 DIAGNOSIS — J9621 Acute and chronic respiratory failure with hypoxia: Secondary | ICD-10-CM | POA: Diagnosis present

## 2018-12-03 DIAGNOSIS — Z79899 Other long term (current) drug therapy: Secondary | ICD-10-CM

## 2018-12-03 LAB — COMPREHENSIVE METABOLIC PANEL
ALK PHOS: 99 U/L (ref 38–126)
ALT: 18 U/L (ref 0–44)
AST: 22 U/L (ref 15–41)
Albumin: 3.8 g/dL (ref 3.5–5.0)
Anion gap: 16 — ABNORMAL HIGH (ref 5–15)
BILIRUBIN TOTAL: 0.6 mg/dL (ref 0.3–1.2)
BUN: 37 mg/dL — AB (ref 6–20)
CALCIUM: 9.2 mg/dL (ref 8.9–10.3)
CO2: 27 mmol/L (ref 22–32)
CREATININE: 2.12 mg/dL — AB (ref 0.61–1.24)
Chloride: 97 mmol/L — ABNORMAL LOW (ref 98–111)
GFR calc Af Amer: 40 mL/min — ABNORMAL LOW (ref >60)
GFR calc non Af Amer: 34 mL/min — ABNORMAL LOW (ref >60)
Glucose, Bld: 292 mg/dL — ABNORMAL HIGH (ref 70–99)
Potassium: 3.3 mmol/L — ABNORMAL LOW (ref 3.5–5.1)
SODIUM: 140 mmol/L (ref 135–145)
Total Protein: 8.2 g/dL — ABNORMAL HIGH (ref 6.5–8.1)

## 2018-12-03 LAB — CBC WITH DIFFERENTIAL/PLATELET
ABS IMMATURE GRANULOCYTES: 0.06 10*3/uL (ref 0.00–0.07)
BASOS PCT: 1 %
Basophils Absolute: 0.1 10*3/uL (ref 0.0–0.1)
Eosinophils Absolute: 0.4 10*3/uL (ref 0.0–0.5)
Eosinophils Relative: 3 %
HCT: 46.2 % (ref 39.0–52.0)
HEMOGLOBIN: 14.6 g/dL (ref 13.0–17.0)
Immature Granulocytes: 1 %
LYMPHS PCT: 45 %
Lymphs Abs: 5.9 10*3/uL — ABNORMAL HIGH (ref 0.7–4.0)
MCH: 28.8 pg (ref 26.0–34.0)
MCHC: 31.6 g/dL (ref 30.0–36.0)
MCV: 91.1 fL (ref 80.0–100.0)
MONO ABS: 1 10*3/uL (ref 0.1–1.0)
MONOS PCT: 8 %
NEUTROS ABS: 5.3 10*3/uL (ref 1.7–7.7)
Neutrophils Relative %: 42 %
Platelets: 302 10*3/uL (ref 150–400)
RBC: 5.07 MIL/uL (ref 4.22–5.81)
RDW: 17 % — ABNORMAL HIGH (ref 11.5–15.5)
WBC: 12.8 10*3/uL — AB (ref 4.0–10.5)
nRBC: 0 % (ref 0.0–0.2)

## 2018-12-03 LAB — BRAIN NATRIURETIC PEPTIDE: B Natriuretic Peptide: 2411 pg/mL — ABNORMAL HIGH (ref 0.0–100.0)

## 2018-12-03 LAB — PROTIME-INR
INR: 0.94
Prothrombin Time: 12.5 seconds (ref 11.4–15.2)

## 2018-12-03 LAB — MAGNESIUM: MAGNESIUM: 2.3 mg/dL (ref 1.7–2.4)

## 2018-12-03 LAB — LIPID PANEL
Cholesterol: 217 mg/dL — ABNORMAL HIGH (ref 0–200)
HDL: 47 mg/dL (ref >40)
LDL CALC: 134 mg/dL — AB (ref 0–99)
Total CHOL/HDL Ratio: 4.6 RATIO
Triglycerides: 179 mg/dL — ABNORMAL HIGH (ref <150)
VLDL: 36 mg/dL (ref 0–40)

## 2018-12-03 LAB — TROPONIN I: Troponin I: 0.05 ng/mL (ref <0.03)

## 2018-12-03 LAB — APTT: aPTT: 26 seconds (ref 24–36)

## 2018-12-03 MED ORDER — FUROSEMIDE 10 MG/ML IJ SOLN
INTRAMUSCULAR | Status: AC
  Filled 2018-12-03: qty 10

## 2018-12-03 MED ORDER — FUROSEMIDE 10 MG/ML IJ SOLN
60.0000 mg | Freq: Once | INTRAMUSCULAR | Status: AC
  Administered 2018-12-03: 60 mg via INTRAVENOUS

## 2018-12-03 MED ORDER — NITROGLYCERIN IN D5W 200-5 MCG/ML-% IV SOLN: 5.0000 ug/min | INTRAVENOUS | Status: DC

## 2018-12-03 MED ORDER — NITROGLYCERIN IN D5W 200-5 MCG/ML-% IV SOLN
INTRAVENOUS | Status: AC
  Filled 2018-12-03: qty 250

## 2018-12-03 MED ORDER — POTASSIUM CHLORIDE 10 MEQ/100ML IV SOLN
10.0000 meq | Freq: Once | INTRAVENOUS | Status: AC
  Administered 2018-12-03: 10 meq via INTRAVENOUS
  Filled 2018-12-03: qty 100

## 2018-12-03 NOTE — ED Provider Notes (Signed)
[facility] Emergency Department Provider Note  ____________________________________________  Time seen: Approximately 7:04 PM  I have reviewed the triage vital signs and the nursing notes.   HISTORY  Chief Complaint Shortness of Breath and Code STEMI  Level 5 caveat:  Portions of the history and physical were unable to be obtained due to severe respiratory distress   HPI Stephen [name] is a 52 y.o. male with a history of nonischemic cardiomyopathy with a EF of 10 to 15%, V. fib arrest status post defibrillator, hypertension and hyperlipidemia who presents via EMS for respiratory distress.  EMS was called to patient's house for respiratory distress.  Patient was found to be hypoxic to the 60s on room air, diaphoretic, and significant respiratory distress.  Patient arrives on CPAP.  Patient has received a full aspirin.  Patient denies chest pain at this time.   Past Medical History:  Diagnosis Date  . AICD (automatic cardioverter/defibrillator) present   . AKI (acute kidney injury) ([place]) 12/24/2017  . Arrhythmia   . Arthritis    "lower back, knees" (06/10/2018)  . Cardiac arrest ([place]) 12/23/2017   Brief V-fib arrest  . Chest pain 09/08/2017  . CHF (congestive heart failure) (HCC)    nonischemic cardiomyopathy, EF 25%  . Gout    "on daily RX" (06/10/2018)  . Headache    "q couple months" (06/10/2018)  . High cholesterol   . History of kidney stones   . Hypertension   . Influenza A 12/24/2017  . NICM (nonischemic cardiomyopathy) ([place])   . Pneumonia 12/20/2017  . TIA (transient ischemic attack) 06/21/2016   "still affects my memory a little bit" (06/10/2018)    Patient Active Problem List   Diagnosis Date Noted  . HTN (hypertension) 11/20/2018  . Hypokalemia 08/19/2018  . Syncope 06/30/2018  . CKD (chronic kidney disease) stage 3, GFR 30-59 ml/min (HCC) 05/13/2018  . Hyperlipidemia 05/13/2018  . ED (erectile dysfunction) 05/13/2018  .  Osteoarthritis of right knee 04/30/2018  . Gout 03/26/2018  . VF (ventricular fibrillation) ([place])   . Nonischemic cardiomyopathy ([place])   . Cardiogenic shock ([place]) 12/24/2017  . 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 History of non-ST elevation myocardial infarction (NSTEMI) 09/08/2017  . Bradycardia 08/30/2017  . Ventricular tachycardia ([place]) 07/29/2017  . COPD (chronic obstructive pulmonary disease) ([place]) 07/04/2017  . TIA (transient ischemic attack) 06/22/2016  . Benign hypertensive renal disease 06/22/2016  . Acute on chronic systolic CHF (congestive heart failure) ([place]) 06/04/2016  . Chronic systolic congestive heart failure ([place]) 01/20/2016  . Cardiomyopathy ([place]) 01/20/2016    Past Surgical History:  Procedure Laterality Date  . EXTERNAL FIXATION LEG Right ~ 2000   "was going bowlegged; had to brake my leg to fix it"  . HERNIA REPAIR    . ICD IMPLANT N/A 12/30/2017   Procedure: ICD IMPLANT;  Surgeon: Deboraha Sprang, MD;  Location: [place] [facility];  Service: Cardiovascular;  Laterality: N/A;  . LEFT HEART CATH AND CORONARY ANGIOGRAPHY N/A 09/09/2017   Procedure: LEFT HEART CATH AND CORONARY ANGIOGRAPHY;  Surgeon: Teodoro Spray, MD;  Location: [place] [facility];  Service: Cardiovascular;  Laterality: N/A;  . UMBILICAL HERNIA REPAIR  1990s  . V TACH ABLATION  06/10/2018  . V TACH ABLATION N/A 06/10/2018   Procedure: V TACH ABLATION;  Surgeon: Thompson Grayer, MD;  Location: [place] [facility];  Service: Cardiovascular;  Laterality: N/A;  . VASECTOMY      Prior to Admission medications  Medication Sig Start Date End Date Taking? Authorizing Provider  acetaminophen (TYLENOL) 325 MG tablet Take 2 tablets (650 mg total) by mouth every 6 (six) hours as needed. Patient taking differently: Take 325-650 mg by mouth every 6 (six) hours as needed for moderate pain.  03/19/18  Yes Carrie Mew, MD  albuterol (PROVENTIL HFA;VENTOLIN HFA) 108 (90 Base) MCG/ACT inhaler Inhale 2 puffs into  the lungs every 4 (four) hours as needed for wheezing or shortness of breath. 09/19/18  Yes Johnson, Megan P, DO  atorvastatin (LIPITOR) 40 MG tablet Take 80 mg by mouth daily at 6 PM.  10/29/18  Yes [provider]  clopidogrel (PLAVIX) 75 MG tablet Take 1 tablet (75 mg total) by mouth daily. 01/30/18  Yes Bensimhon, Shaune Pascal, MD  metolazone (ZAROXOLYN) 2.5 MG tablet Take 1 tablet (2.5 mg total) by mouth once daily TAKE 30 MINUTES PRIOR TO LASIX 09/05/18  Yes [provider]  potassium chloride SA (K-DUR,KLOR-CON) 20 MEQ tablet Take 1 tablet (20 mEq total) by mouth 2 (two) times daily. Patient taking differently: Take 20 mEq by mouth 4 (four) times daily.  07/06/18  Yes Wieting, Richard, MD  sacubitril-valsartan (ENTRESTO) 49-51 MG Take 1 tablet by mouth 2 (two) times daily.   Yes [provider]  torsemide (DEMADEX) 20 MG tablet Take 2 tablets (40 mg total) by mouth daily. 07/06/18  Yes Wieting, Richard, MD  allopurinol (ZYLOPRIM) 100 MG tablet Take 2 tablets (200 mg total) by mouth daily. 10/19/18   Johnson, Megan P, DO  aspirin EC 81 MG EC tablet Take 1 tablet (81 mg total) by mouth daily. 01/01/18   Daune Perch, NP  Camphor-Eucalyptus-Menthol (CHEST RUB) 4.8-1.2-2.6 % OINT Apply 1 application topically daily as needed (congestion).     [provider]  carvedilol (COREG) 3.125 MG tablet Take 1 tablet (3.125 mg total) by mouth 2 (two) times daily with a meal. 07/06/18   Leslye Peer, Richard, MD  ciprofloxacin (CIPRO) 500 MG tablet Take 1 tablet (500 mg total) by mouth 2 (two) times daily. Patient taking differently: Take 500 mg by mouth as needed.  11/13/18   Johnson, Megan P, DO  colchicine 0.6 MG tablet Take 1 tablet (0.6 mg total) by mouth daily as needed (gout flare). 09/03/18   Johnson, Megan P, DO  cyclobenzaprine (FLEXERIL) 5 MG tablet Take 1 tablet (5 mg total) by mouth 3 (three) times daily as needed (jaw pain). 11/24/18   Nance Pear, MD  diclofenac sodium  (VOLTAREN) 1 % GEL Apply 2 g topically 4 (four) times daily as needed (pain).     [provider]  fluticasone (FLONASE) 50 MCG/ACT nasal spray Place 2 sprays into both nostrils daily. Patient taking differently: Place 2 sprays into both nostrils daily as needed for allergies.  06/20/17   [name], MD  fluticasone-salmeterol (ADVAIR HFA) [phone] MCG/ACT inhaler Inhale 2 puffs into the lungs 2 (two) times daily. 08/12/18   [name], MD  meloxicam (MOBIC) 15 MG tablet Take 15 mg by mouth daily.    [provider]  Menthol, Topical Analgesic, (ICY HOT EX) Apply 1 application topically daily as needed (pain).    [provider]  Multiple Vitamin (MULTIVITAMIN WITH MINERALS) TABS tablet Take 1 tablet by mouth daily.    [provider]  naproxen (NAPROSYN) 500 MG tablet Take 1 tablet (500 mg total) by mouth 2 (two) times daily with a meal. 11/24/18 11/24/19  Nance Pear, MD  nitroGLYCERIN (NITROSTAT) 0.4 MG SL tablet  Place 1 tablet (0.4 mg total) under the tongue every 5 (five) minutes as needed for chest pain. 02/19/18   Bensimhon, Shaune Pascal, MD  promethazine (PHENERGAN) 12.5 MG tablet Take 1 tablet (12.5 mg total) by mouth every 6 (six) hours as needed for nausea or vomiting. 11/24/18   Nance Pear, MD  sacubitril-valsartan (ENTRESTO) 24-26 MG Take 1 tablet by mouth 2 (two) times daily. 07/06/18   Loletha Grayer, MD  sildenafil (REVATIO) 20 MG tablet Take by mouth as needed.  10/29/18 10/29/19  [provider]  tamsulosin (FLOMAX) 0.4 MG CAPS capsule Take 1 capsule (0.4 mg total) by mouth daily. 11/13/18   Johnson, Megan P, DO  Tiotropium Bromide Monohydrate (SPIRIVA RESPIMAT) 2.5 MCG/ACT AERS Inhale 1 puff into the lungs daily. 08/12/18   [name], MD    Allergies Lisinopril  Family History  Problem Relation Age of Onset  . Hypertension Mother   . Heart failure Mother   . Hypertension Father   . CAD Father   . Heart attack Father       Social History Social History   Tobacco Use  . Smoking status: Former Smoker    Packs/day: 0.33    Years: 33.00    Pack years: 10.89    Types: Cigarettes    Last attempt to quit: 02/20/2016    Years since quitting: 2.7  . Smokeless tobacco: Never Used  Substance Use Topics  . Alcohol use: Yes    Alcohol/week: 0.0 standard drinks    Comment: 06/10/2018 "beer once/month; if that"  . Drug use: Never    Review of Systems  Constitutional: Negative for fever. Cardiovascular: Negative for chest pain. Respiratory: + shortness of breath. Gastrointestinal: Negative for abdominal pain, vomiting or diarrhea. Musculoskeletal: Negative for back pain. Skin: Negative for rash. Neurological: Negative for headaches, weakness or numbness.  ____________________________________________   PHYSICAL EXAM:  VITAL SIGNS: ED Triage Vitals  Enc Vitals Group     BP 12/03/18 1851 (!) 132/98     Pulse Rate 12/03/18 1851 63     Resp 12/03/18 1853 (!) 36     Temp --      Temp src --      SpO2 12/03/18 1851 100 %     Weight 12/03/18 1853 235 lb 14.3 oz (107 kg)     Height 12/03/18 1853 6\' 1"  (1.854 m)     Head Circumference --      Peak Flow --      Pain Score --      Pain Loc --      Pain Edu? --      Excl. in [place]? --     Constitutional: Patient arrives diaphoretic in severe respiratory distress, alert. HEENT:      Head: Normocephalic and atraumatic.         Eyes: Conjunctivae are normal. Sclera is non-icteric.       Mouth/Throat: Mucous membranes are moist.       Neck: Supple with no signs of meningismus. Cardiovascular: Tachycardic with regular rhythm. Respiratory: Severely increased work of breathing, hypoxic, crackles bilaterally  gastrointestinal: Soft, non tender, and non distended with positive bowel sounds. No rebound or guarding. Musculoskeletal: Nontender with normal range of motion in all extremities. No edema, cyanosis, or erythema of extremities. Neurologic: Normal  speech and language. Face is symmetric. Moving all extremities. No gross focal neurologic deficits are appreciated. Skin: Skin is warm, dry and intact. No rash noted. Psychiatric: Mood and affect are normal. Speech and  behavior are normal.  ____________________________________________   LABS (all labs ordered are listed, but only abnormal results are displayed)  Labs Reviewed  CBC WITH DIFFERENTIAL/PLATELET - Abnormal; Notable for the following components:      Result Value   WBC 12.8 (*)    RDW 17.0 (*)    Lymphs Abs 5.9 (*)    All other components within normal limits  COMPREHENSIVE METABOLIC PANEL - Abnormal; Notable for the following components:   Potassium 3.3 (*)    Chloride 97 (*)    Glucose, Bld 292 (*)    BUN 37 (*)    Creatinine, Ser 2.12 (*)    Total Protein 8.2 (*)    GFR calc non Af Amer 34 (*)    GFR calc Af Amer 40 (*)    Anion gap 16 (*)    All other components within normal limits  TROPONIN I - Abnormal; Notable for the following components:   Troponin I 0.05 (*)    All other components within normal limits  LIPID PANEL - Abnormal; Notable for the following components:   Cholesterol 217 (*)    Triglycerides 179 (*)    LDL Cholesterol 134 (*)    All other components within normal limits  BRAIN NATRIURETIC PEPTIDE - Abnormal; Notable for the following components:   B Natriuretic Peptide 2,411.0 (*)    All other components within normal limits  PROTIME-INR  APTT  MAGNESIUM   ____________________________________________  EKG  ED ECG REPORT I, Rudene Re, the attending physician, personally viewed and interpreted this ECG.  Sinus tachycardia, left bundle branch block, global ischemia, prolonged QTC.  Left bundle is not new when compared to prior. ____________________________________________  RADIOLOGY  I have personally reviewed the images performed during this visit and I agree with the Radiologist's read.   Interpretation by Radiologist:   Dg Chest Portable 1 View  Result Date: 12/03/2018 CLINICAL DATA:  Shortness of breath EXAM: PORTABLE CHEST 1 VIEW COMPARISON:  11/24/2018 FINDINGS: Left AICD remains in place, unchanged. Cardiomegaly. Diffuse severe bilateral airspace disease, right greater than left. No visible significant effusions. No acute bony abnormality. IMPRESSION: Severe diffuse bilateral airspace disease, right greater than left. This could reflect asymmetric edema or infection. Electronically Signed   By: Rolm Baptise M.D.   On: 12/03/2018 19:34      ____________________________________________   PROCEDURES  Procedure(s) performed: None Procedures Critical Care performed: yes  CRITICAL CARE Performed by: Rudene Re  ?  Total critical care time: 40 min  Critical care time was exclusive of separately billable procedures and treating other patients.  Critical care was necessary to treat or prevent imminent or life-threatening deterioration.  Critical care was time spent personally by me on the following activities: development of treatment plan with patient and/or surrogate as well as nursing, discussions with consultants, evaluation of patient's response to treatment, examination of patient, obtaining history from patient or surrogate, ordering and performing treatments and interventions, ordering and review of laboratory studies, ordering and review of radiographic studies, pulse oximetry and re-evaluation of patient's condition.  ____________________________________________   INITIAL IMPRESSION / ASSESSMENT AND PLAN / ED COURSE   52 y.o. male with a history of nonischemic cardiomyopathy with a EF of 10 to 15%, V. fib arrest status post defibrillator, hypertension and hyperlipidemia who presents via EMS for respiratory distress.  Patient arrives in extremis, diaphoretic, severe respiratory distress with crackles on bilateral lungs.  No pitting edema bilateral lower extremities.  Patient was placed  immediately on BiPAP  and nitro drip for concerns of flash pulmonary edema.  Patient was called a STEMI from the field due to left bundle branch block and diaphoresis.  Left bundle branch block is old.  Deep before patient even arrived to the emergency room I received a phone call from Dr. Jerrye Beavers, cardiologist on call for STEMI who told me that this patient is his, patient has nonischemic cardiomyopathy and that his EKG is unchanged from baseline.  He said that he was canceling the code STEMI.  I explained to him that I did not even have a patient in the room to be able to provide him with any history or on exam at this time.  Patient had been given aspirin per EMS.  He was given 60 mg of IV Lasix upon arrival.  Chest x-ray showing pulmonary edema.  Labs are pending.    _________________________ 8:08 PM on 12/03/2018 -----------------------------------------  Patient seen by Dr. Jerrye Beavers in the ED with no other recommendations at this time. Patient looks much more comfortable now on BiPAP after Lasix and nitro drip.  Kidney function is at patient's baseline.  First troponin is at patient's baseline.  Slightly hypokalemia which was supplemented IV.  Discussed with Dr. Verdell Carmine for admission.   As part of my medical decision making, I reviewed the following data within the [place] notes reviewed and incorporated, Labs reviewed , EKG interpreted , Old EKG reviewed, Old chart reviewed, Radiograph reviewed , Discussed with admitting physician , A consult was requested and obtained from this/these consultant(s) Cardiology, Notes from prior ED visits and [place] Controlled Substance Database    Pertinent labs & imaging results that were available during my care of the patient were reviewed by me and considered in my medical decision making (see chart for details).    ____________________________________________   FINAL CLINICAL IMPRESSION(S) / ED DIAGNOSES  Final diagnoses:    Flash pulmonary edema ([place])  Acute respiratory failure with hypoxia (HCC)  Acute on chronic congestive heart failure, unspecified heart failure type ([place])      NEW MEDICATIONS STARTED DURING THIS VISIT:  ED Discharge Orders    None       Note:  This document was prepared using Dragon voice recognition software and may include unintentional dictation errors.    Rudene Re, MD 12/03/18 2009

## 2018-12-03 NOTE — ED Notes (Signed)
O2 turned down to 4l per [place] per MD order, will monitor resp.

## 2018-12-03 NOTE — ED Notes (Signed)
Pt on Zoll monitor pads.

## 2018-12-03 NOTE — Consult Note (Signed)
Called to see patient as part of a code STEMI.  After reviewing the EKGs in the field I cancel code STEMI early as he did not meet criteria. The patient is well-known to me with an old bundle branch block , nonischemic cardiomyopathy pacemaker defibrillator EKG suggested bundle branch no ischemic changes.  I suspect that the patient was having worsening heart failure.  I was able to call off the team but I came in and saw the patient anyway.  By the time I saw him the patient was on BiPAP improving he never complained of chest pain only had pulmonary edema and heart failure.  Patient has been treated medically and aggressively with hopefully aggressive treatment for rest tori failure possible ICU admission and aggressive heart failure management. Case discussed with patient family and emergency room physician [facility]

## 2018-12-03 NOTE — ED Notes (Signed)
Report received from Amy RN, care assumed. Pt changed over to high flow o2 per [place] at 6l per Dr. Verlee Monte. Pt denies any co pain or shob at this time. Awaiting bed assignment. Will continue to monitor.

## 2018-12-03 NOTE — ED Notes (Signed)
Blood pressure low.  Dr Verdell Carmine aware.  ntg turned off per dr Hamilton Capri verbal order.

## 2018-12-03 NOTE — ED Triage Notes (Signed)
Arrives to ER via [facility]. Called for breathing difficulty. Pt arrives on CPAP. Received 324 ASA. Called code STEMI in field. Pt cardiac hx. Unable to obtain BP. 70% on RA. EDP at bedside.

## 2018-12-03 NOTE — Progress Notes (Signed)
   12/03/18 1850  Clinical Encounter Type  Visited With Patient  Visit Type Initial;Spiritual support;ED  Referral From Nurse  Consult/Referral To Chaplain  Spiritual Encounters  Spiritual Needs Prayer;Other (Comment)   [place] received a Code Stemi page that was canceled before I arrived to the ED. I proceeded to the patient's room where he was being care for. The patient was sitting up and alert. His sister is on her way to the hospital. I informed the staff that I would be available if needed.

## 2018-12-03 NOTE — ED Notes (Signed)
Pt doing well with o2, sats 92% at this time, denies any shob.

## 2018-12-03 NOTE — ED Notes (Signed)
Report to Amy, RN

## 2018-12-03 NOTE — ED Notes (Signed)
Pt is alert and denies chest pain. Spoke with sister per pt request. She is on the way. ED provider at the bedside. Pt denies any shocks from defibrilator.

## 2018-12-03 NOTE — H&P (Signed)
[name] NAME: Stephen [name]    MR#:  [id number]  DATE OF BIRTH:  Jan 01, 1966  DATE OF ADMISSION:  12/03/2018  PRIMARY CARE PHYSICIAN: Valerie Roys, DO   REQUESTING/REFERRING PHYSICIAN: Dr. Rudene Re  CHIEF COMPLAINT:   Chief Complaint  Patient presents with  . Shortness of Breath  . Code STEMI    HISTORY OF PRESENT ILLNESS:  Stephen [name]  is a 52 y.o. male with a known history of severe nonischemic cardiomyopathy, hypertension, status post AICD, chronic systolic CHF, previous history of TIA, hyperlipidemia, gout, osteoarthritis, chronic kidney disease stage III who presents to the hospital due to shortness of breath.  Patient was in his usual state of health when late this afternoon he developed sudden onset of shortness of breath.  EMS was called and he was brought to the hospital.  When EMS arrived at his girlfriend's house a code STEMI was initially initiated but patient has a chronic left bundle and he discussed with the cardiologist who said that the patient was not having an ST elevation MI and therefore he was brought to the ER for further evaluation.  In the ER patient was noted to be in acute respiratory failure with hypoxia secondary to CHF.  He was placed on BiPAP, given IV diuretics and is feeling better.  Hospitalist services were contacted for admission.  Patient does admit that he has been compliant with his medications, he denies any increasing salt intake or worsening lower extremity edema or any paroxysmal nocturnal dyspnea orthopnea.  PAST MEDICAL HISTORY:   Past Medical History:  Diagnosis Date  . AICD (automatic cardioverter/defibrillator) present   . AKI (acute kidney injury) ([place]) 12/24/2017  . Arrhythmia   . Arthritis    "lower back, knees" (06/10/2018)  . Cardiac arrest ([place]) 12/23/2017   Brief V-fib arrest  . Chest pain 09/08/2017  . CHF (congestive heart failure) ([facility])    nonischemic  cardiomyopathy, EF 25%  . Gout    "on daily RX" (06/10/2018)  . Headache    "q couple months" (06/10/2018)  . High cholesterol   . History of kidney stones   . Hypertension   . Influenza A 12/24/2017  . NICM (nonischemic cardiomyopathy) ([place])   . Pneumonia 12/20/2017  . TIA (transient ischemic attack) 06/21/2016   "still affects my memory a little bit" (06/10/2018)    PAST SURGICAL HISTORY:   Past Surgical History:  Procedure Laterality Date  . EXTERNAL FIXATION LEG Right ~ 2000   "was going bowlegged; had to brake my leg to fix it"  . HERNIA REPAIR    . ICD IMPLANT N/A 12/30/2017   Procedure: ICD IMPLANT;  Surgeon: Deboraha Sprang, MD;  Location: [facility];  Service: Cardiovascular;  Laterality: N/A;  . LEFT HEART CATH AND CORONARY ANGIOGRAPHY N/A 09/09/2017   Procedure: LEFT HEART CATH AND CORONARY ANGIOGRAPHY;  Surgeon: Teodoro Spray, MD;  Location: [facility];  Service: Cardiovascular;  Laterality: N/A;  . UMBILICAL HERNIA REPAIR  1990s  . V TACH ABLATION  06/10/2018  . V TACH ABLATION N/A 06/10/2018   Procedure: V TACH ABLATION;  Surgeon: Thompson Grayer, MD;  Location: [facility];  Service: Cardiovascular;  Laterality: N/A;  . VASECTOMY      SOCIAL HISTORY:   Social History   Tobacco Use  . Smoking status: Former Smoker    Packs/day: 0.33    Years: 33.00  Pack years: 10.89    Types: Cigarettes    Last attempt to quit: 02/20/2016    Years since quitting: 2.7  . Smokeless tobacco: Never Used  Substance Use Topics  . Alcohol use: Yes    Alcohol/week: 0.0 standard drinks    Comment: 06/10/2018 "beer once/month; if that"    FAMILY HISTORY:   Family History  Problem Relation Age of Onset  . Hypertension Mother   . Heart failure Mother   . Hypertension Father   . CAD Father   . Heart attack Father     DRUG ALLERGIES:   Allergies  Allergen Reactions  . Lisinopril Cough    REVIEW OF SYSTEMS:   Review of Systems    Constitutional: Negative for fever and weight loss.  HENT: Negative for congestion, nosebleeds and tinnitus.   Eyes: Negative for blurred vision, double vision and redness.  Respiratory: Positive for shortness of breath. Negative for cough and hemoptysis.   Cardiovascular: Negative for chest pain, orthopnea, leg swelling and PND.  Gastrointestinal: Negative for abdominal pain, diarrhea, melena, nausea and vomiting.  Genitourinary: Negative for dysuria, hematuria and urgency.  Musculoskeletal: Negative for falls and joint pain.  Neurological: Negative for dizziness, tingling, sensory change, focal weakness, seizures, weakness and headaches.  Endo/Heme/Allergies: Negative for polydipsia. Does not bruise/bleed easily.  Psychiatric/Behavioral: Negative for depression and memory loss. The patient is not nervous/anxious.     MEDICATIONS AT HOME:   Prior to Admission medications   Medication Sig Start Date End Date Taking? Authorizing Provider  acetaminophen (TYLENOL) 325 MG tablet Take 2 tablets (650 mg total) by mouth every 6 (six) hours as needed. Patient taking differently: Take 325-650 mg by mouth every 6 (six) hours as needed for moderate pain.  03/19/18  Yes Carrie Mew, MD  albuterol (PROVENTIL HFA;VENTOLIN HFA) 108 (90 Base) MCG/ACT inhaler Inhale 2 puffs into the lungs every 4 (four) hours as needed for wheezing or shortness of breath. 09/19/18  Yes Johnson, Megan P, DO  allopurinol (ZYLOPRIM) 100 MG tablet Take 2 tablets (200 mg total) by mouth daily. 10/19/18  Yes Johnson, Megan P, DO  amiodarone (PACERONE) 200 MG tablet Take 300 mg by mouth daily.   Yes [provider]  aspirin EC 81 MG EC tablet Take 1 tablet (81 mg total) by mouth daily. 01/01/18  Yes Daune Perch, NP  atorvastatin (LIPITOR) 40 MG tablet Take 80 mg by mouth daily at 6 PM.  10/29/18  Yes [provider]  carvedilol (COREG) 3.125 MG tablet Take 1 tablet (3.125 mg total) by mouth 2 (two) times  daily with a meal. 07/06/18  Yes Wieting, Richard, MD  clopidogrel (PLAVIX) 75 MG tablet Take 1 tablet (75 mg total) by mouth daily. 01/30/18  Yes Bensimhon, Shaune Pascal, MD  colchicine 0.6 MG tablet Take 1 tablet (0.6 mg total) by mouth daily as needed (gout flare). 09/03/18  Yes Johnson, Megan P, DO  cyclobenzaprine (FLEXERIL) 5 MG tablet Take 1 tablet (5 mg total) by mouth 3 (three) times daily as needed (jaw pain). 11/24/18  Yes Nance Pear, MD  diclofenac sodium (VOLTAREN) 1 % GEL Apply 2 g topically 4 (four) times daily as needed (pain).    Yes [provider]  fluticasone (FLONASE) 50 MCG/ACT nasal spray Place 2 sprays into both nostrils daily. Patient taking differently: Place 2 sprays into both nostrils daily as needed for allergies.  06/20/17  Yes [name], MD  fluticasone-salmeterol (ADVAIR HFA) [phone] MCG/ACT inhaler Inhale 2 puffs into the  lungs 2 (two) times daily. 08/12/18  Yes Kasa, Maretta Bees, MD  Menthol, Topical Analgesic, (ICY HOT EX) Apply 1 application topically daily as needed (pain).   Yes [provider]  metolazone (ZAROXOLYN) 2.5 MG tablet Take 1 tablet (2.5 mg total) by mouth once daily TAKE 30 MINUTES PRIOR TO LASIX 09/05/18  Yes [provider]  potassium chloride SA (K-DUR,KLOR-CON) 20 MEQ tablet Take 1 tablet (20 mEq total) by mouth 2 (two) times daily. Patient taking differently: Take 20 mEq by mouth 4 (four) times daily.  07/06/18  Yes Wieting, Richard, MD  promethazine (PHENERGAN) 12.5 MG tablet Take 1 tablet (12.5 mg total) by mouth every 6 (six) hours as needed for nausea or vomiting. 11/24/18  Yes Nance Pear, MD  sacubitril-valsartan (ENTRESTO) 49-51 MG Take 0.5 tablets by mouth 2 (two) times daily.    Yes [provider]  sildenafil (REVATIO) 20 MG tablet Take by mouth as needed.  10/29/18 10/29/19 Yes [provider]  Tiotropium Bromide Monohydrate (SPIRIVA RESPIMAT) 2.5 MCG/ACT AERS Inhale 1 puff into the lungs  daily. 08/12/18  Yes [name], MD  torsemide (DEMADEX) 20 MG tablet Take 2 tablets (40 mg total) by mouth daily. 07/06/18  Yes Wieting, Richard, MD  Camphor-Eucalyptus-Menthol (CHEST RUB) 4.8-1.2-2.6 % OINT Apply 1 application topically daily as needed (congestion).     [provider]  ciprofloxacin (CIPRO) 500 MG tablet Take 1 tablet (500 mg total) by mouth 2 (two) times daily. Patient not taking: Reported on 12/03/2018 11/13/18   [place] Liter P, DO  meloxicam (MOBIC) 15 MG tablet Take 15 mg by mouth daily.    [provider]  Multiple Vitamin (MULTIVITAMIN WITH MINERALS) TABS tablet Take 1 tablet by mouth daily.    [provider]  naproxen (NAPROSYN) 500 MG tablet Take 1 tablet (500 mg total) by mouth 2 (two) times daily with a meal. Patient not taking: Reported on 12/03/2018 11/24/18 11/24/19  Nance Pear, MD  nitroGLYCERIN (NITROSTAT) 0.4 MG SL tablet Place 1 tablet (0.4 mg total) under the tongue every 5 (five) minutes as needed for chest pain. 02/19/18   Bensimhon, Shaune Pascal, MD  sacubitril-valsartan (ENTRESTO) 24-26 MG Take 1 tablet by mouth 2 (two) times daily. 07/06/18   Loletha Grayer, MD  tamsulosin (FLOMAX) 0.4 MG CAPS capsule Take 1 capsule (0.4 mg total) by mouth daily. Patient not taking: Reported on 12/03/2018 11/13/18   Park Liter P, DO      VITAL SIGNS:  Blood pressure 115/87, pulse 96, resp. rate (!) 35, height 6\' 1"  (1.854 m), weight 107 kg, SpO2 96 %.  PHYSICAL EXAMINATION:  Physical Exam  GENERAL:  52 y.o.-year-old patient lying in the bed in mild Resp. Distress due to CHF on Bipap.  EYES: Pupils equal, round, reactive to light and accommodation. No scleral icterus. Extraocular muscles intact.  HEENT: Head atraumatic, normocephalic. Oropharynx and nasopharynx clear. No oropharyngeal erythema, moist oral mucosa  NECK:  Supple, no jugular venous distention. No thyroid enlargement, no tenderness.  LUNGS: Normal breath sounds  bilaterally, no wheezing, diffuse rales b/l, No rhonchi. + use of accessory muscles of respiration.  CARDIOVASCULAR: S1, S2 RRR. No murmurs, rubs, gallops, clicks.  ABDOMEN: Soft, nontender, nondistended. Bowel sounds present. No organomegaly or mass.  EXTREMITIES: +1 pedal edema b/l, NO cyanosis, or clubbing. + 2 pedal & radial pulses b/l.   NEUROLOGIC: Cranial nerves II through XII are intact. No focal Motor or sensory deficits appreciated b/l. Globally weak.  PSYCHIATRIC: The patient is alert  and oriented x 3.  SKIN: No obvious rash, lesion, or ulcer.   LABORATORY PANEL:   CBC Recent Labs  Lab 12/03/18 1857  WBC 12.8*  HGB 14.6  HCT 46.2  PLT 302   ------------------------------------------------------------------------------------------------------------------  Chemistries  Recent Labs  Lab 12/03/18 1857  NA 140  K 3.3*  CL 97*  CO2 27  GLUCOSE 292*  BUN 37*  CREATININE 2.12*  CALCIUM 9.2  MG 2.3  AST 22  ALT 18  ALKPHOS 99  BILITOT 0.6   ------------------------------------------------------------------------------------------------------------------  Cardiac Enzymes Recent Labs  Lab 12/03/18 1857  TROPONINI 0.05*   ------------------------------------------------------------------------------------------------------------------  RADIOLOGY:  Dg Chest Portable 1 View  Result Date: 12/03/2018 CLINICAL DATA:  Shortness of breath EXAM: PORTABLE CHEST 1 VIEW COMPARISON:  11/24/2018 FINDINGS: Left AICD remains in place, unchanged. Cardiomegaly. Diffuse severe bilateral airspace disease, right greater than left. No visible significant effusions. No acute bony abnormality. IMPRESSION: Severe diffuse bilateral airspace disease, right greater than left. This could reflect asymmetric edema or infection. Electronically Signed   By: Rolm Baptise M.D.   On: 12/03/2018 19:34     IMPRESSION AND PLAN:   52 year old male with past medical history of nonischemic  cardiomyopathy status post AICD, chronic systolic CHF, previous history of TIA, gout, osteoarthritis, hypertension who presents to the hospital due to shortness of breath.  1.  Acute respiratory failure with hypoxia-secondary to CHF. - Currently on BiPAP and will wean as tolerated.  Continue diuresis with IV Lasix.  2.  CHF-acute on chronic systolic dysfunction.  Patient has a severe nonischemic cardiomyopathy with EF of 15%. - We will diurese the patient with IV Lasix, follow I's and O's and daily weights. -Continue carvedilol, Entresto, Aldactone. -We will get cardiology consult.  3.  Elevated troponin-likely in the setting of supply demand ischemia from underlying CHF.   -We will cycle patient's cardiac markers, continue aspirin.  4.  History of gout-no acute attack.  Continue allopurinol, colchicine.  5.  Chronic kidney disease stage III-patient's creatinine is close to baseline we will continue to monitor.  6.  COPD-no acute exacerbation- continue Dulera, Spiriva.  7.  Hyperlipidemia-continue atorvastatin.    All the records are reviewed and case discussed with ED provider. Management plans discussed with the patient, family and they are in agreement.  CODE STATUS: Full code  TOTAL Critical Care TIME TAKING CARE OF THIS PATIENT: 45 minutes.    Henreitta Leber M.D on 12/03/2018 at 8:28 PM  Between 7am to 6pm - Pager - [phone]  After 6pm go to [email or web address] - password [email or web address] [facility]  Office  [phone]  CC: Primary care physician; Valerie Roys, DO

## 2018-12-03 NOTE — ED Notes (Signed)
Pt alert.  bipap in place  Family with pt.  No chest pain.  Pt is sob.  Skin warm and dry.  Iv meds infusing.

## 2018-12-03 NOTE — Progress Notes (Signed)
Cardiac Individual Treatment Plan  Patient Details  Name: Stephen [name] MRN: [id number] Date of Birth: [birth year] Referring Provider:     Cardiac Rehab from 11/10/2018 in [facility] Cardiac and Pulmonary Rehab  Referring Provider  [facility]      Initial Encounter Date:    Cardiac Rehab from 11/10/2018 in [facility] Cardiac and Pulmonary Rehab  Date  11/10/18      Visit Diagnosis: Heart failure, chronic systolic ([place])  Patient's Home Medications on Admission:  Current Outpatient Medications:  .  acetaminophen (TYLENOL) 325 MG tablet, Take 2 tablets (650 mg total) by mouth every 6 (six) hours as needed. (Patient taking differently: Take 325-650 mg by mouth every 6 (six) hours as needed for moderate pain. ), Disp: 60 tablet, Rfl: 0 .  albuterol (PROVENTIL HFA;VENTOLIN HFA) 108 (90 Base) MCG/ACT inhaler, Inhale 2 puffs into the lungs every 4 (four) hours as needed for wheezing or shortness of breath., Disp: 1 Inhaler, Rfl: 0 .  allopurinol (ZYLOPRIM) 100 MG tablet, Take 2 tablets (200 mg total) by mouth daily., Disp: 60 tablet, Rfl: 6 .  amiodarone (PACERONE) 200 MG tablet, Take 1.5 tablets (300 mg total) by mouth daily., Disp: , Rfl:  .  aspirin EC 81 MG EC tablet, Take 1 tablet (81 mg total) by mouth daily., Disp: 90 tablet, Rfl: 3 .  atorvastatin (LIPITOR) 40 MG tablet, Take 80 mg by mouth daily at 6 PM. , Disp: , Rfl: 6 .  Camphor-Eucalyptus-Menthol (CHEST RUB) 4.8-1.2-2.6 % OINT, Apply 1 application topically daily as needed (congestion). , Disp: , Rfl:  .  carvedilol (COREG) 3.125 MG tablet, Take 1 tablet (3.125 mg total) by mouth 2 (two) times daily with a meal., Disp: 60 tablet, Rfl: 0 .  ciprofloxacin (CIPRO) 500 MG tablet, Take 1 tablet (500 mg total) by mouth 2 (two) times daily. (Patient taking differently: Take 500 mg by mouth as needed. ), Disp: 14 tablet, Rfl: 0 .  clopidogrel (PLAVIX) 75 MG tablet, Take 1 tablet (75 mg total) by mouth daily., Disp: 30 tablet, Rfl: 5 .  colchicine  0.6 MG tablet, Take 1 tablet (0.6 mg total) by mouth daily as needed (gout flare)., Disp: 6 tablet, Rfl: 1 .  cyclobenzaprine (FLEXERIL) 5 MG tablet, Take 1 tablet (5 mg total) by mouth 3 (three) times daily as needed (jaw pain)., Disp: 20 tablet, Rfl: 0 .  diclofenac sodium (VOLTAREN) 1 % GEL, Apply 2 g topically 4 (four) times daily as needed (pain). , Disp: , Rfl:  .  fluticasone (FLONASE) 50 MCG/ACT nasal spray, Place 2 sprays into both nostrils daily. (Patient taking differently: Place 2 sprays into both nostrils daily as needed for allergies. ), Disp: 16 g, Rfl: 2 .  fluticasone-salmeterol (ADVAIR HFA) 115-21 MCG/ACT inhaler, Inhale 2 puffs into the lungs 2 (two) times daily., Disp: 1 Inhaler, Rfl: 12 .  meloxicam (MOBIC) 15 MG tablet, Take 15 mg by mouth daily., Disp: , Rfl:  .  Menthol, Topical Analgesic, (ICY HOT EX), Apply 1 application topically daily as needed (pain)., Disp: , Rfl:  .  metolazone (ZAROXOLYN) 2.5 MG tablet, Take 1 tablet (2.5 mg total) by mouth once daily TAKE 30 MINUTES PRIOR TO LASIX, Disp: , Rfl: 11 .  Multiple Vitamin (MULTIVITAMIN WITH MINERALS) TABS tablet, Take 1 tablet by mouth daily., Disp: , Rfl:  .  naproxen (NAPROSYN) 500 MG tablet, Take 1 tablet (500 mg total) by mouth 2 (two) times daily with a meal., Disp: 20 tablet, Rfl: 0 .  nitroGLYCERIN (NITROSTAT) 0.4 MG SL tablet, Place 1 tablet (0.4 mg total) under the tongue every 5 (five) minutes as needed for chest pain., Disp: 25 tablet, Rfl: 3 .  potassium chloride SA (K-DUR,KLOR-CON) 20 MEQ tablet, Take 1 tablet (20 mEq total) by mouth 2 (two) times daily. (Patient taking differently: Take 20 mEq by mouth 4 (four) times daily. ), Disp: 60 tablet, Rfl: 0 .  promethazine (PHENERGAN) 12.5 MG tablet, Take 1 tablet (12.5 mg total) by mouth every 6 (six) hours as needed for nausea or vomiting., Disp: 20 tablet, Rfl: 0 .  sacubitril-valsartan (ENTRESTO) 24-26 MG, Take 1 tablet by mouth 2 (two) times daily., Disp: 60  tablet, Rfl: 0 .  sildenafil (REVATIO) 20 MG tablet, Take by mouth as needed. , Disp: , Rfl:  .  tamsulosin (FLOMAX) 0.4 MG CAPS capsule, Take 1 capsule (0.4 mg total) by mouth daily., Disp: 30 capsule, Rfl: 0 .  Tiotropium Bromide Monohydrate (SPIRIVA RESPIMAT) 2.5 MCG/ACT AERS, Inhale 1 puff into the lungs daily., Disp: 1 Inhaler, Rfl: 12 .  torsemide (DEMADEX) 20 MG tablet, Take 2 tablets (40 mg total) by mouth daily., Disp: 60 tablet, Rfl: 0  Past Medical History: Past Medical History:  Diagnosis Date  . AICD (automatic cardioverter/defibrillator) present   . AKI (acute kidney injury) ([place]) 12/24/2017  . Arrhythmia   . Arthritis    "lower back, knees" (06/10/2018)  . Cardiac arrest ([place]) 12/23/2017   Brief V-fib arrest  . Chest pain 09/08/2017  . CHF (congestive heart failure) ([facility])    nonischemic cardiomyopathy, EF 25%  . Gout    "on daily RX" (06/10/2018)  . Headache    "q couple months" (06/10/2018)  . High cholesterol   . History of kidney stones   . Hypertension   . Influenza A 12/24/2017  . NICM (nonischemic cardiomyopathy) ([place])   . Pneumonia 12/20/2017  . TIA (transient ischemic attack) 06/21/2016   "still affects my memory a little bit" (06/10/2018)    Tobacco Use: Social History   Tobacco Use  Smoking Status Former Smoker  . Packs/day: 0.33  . Years: 33.00  . Pack years: 10.89  . Types: Cigarettes  . Last attempt to quit: 02/20/2016  . Years since quitting: 2.7  Smokeless Tobacco Never Used    Labs: Recent Review Flowsheet Data    Labs for ITP Cardiac and Pulmonary Rehab Latest Ref Rng & Units 12/26/2017 12/27/2017 05/13/2018 06/16/2018 09/19/2018   Cholestrol 100 - 199 mg/dL - - 240(H) 181 184   LDLCALC 0 - 99 mg/dL - - 167(H) 123(H) 123(H)   HDL >39 mg/dL - - 49 36(L) 42   Trlycerides 0 - 149 mg/dL - - 122 110 94   PHART 7.350 - 7.450 - - - - -   PCO2ART 32.0 - 48.0 mmHg - - - - -   HCO3 20.0 - 28.0 mmol/L - - - - -   TCO2 22 - 32 mmol/L - - - - -    ACIDBASEDEF 0.0 - 2.0 mmol/L - - - - -   O2SAT % 54.9 57.2 - - -       Exercise Target Goals: Exercise Program Goal: Individual exercise prescription set using results from initial 6 min walk test and THRR while considering  patient's activity barriers and safety.   Exercise Prescription Goal: Initial exercise prescription builds to 30-45 minutes a day of aerobic activity, 2-3 days per week.  Home exercise guidelines will be given to patient during program as  part of exercise prescription that the participant will acknowledge.  Activity Barriers & Risk Stratification: Activity Barriers & Cardiac Risk Stratification - 11/10/18 1453      Activity Barriers & Cardiac Risk Stratification   Activity Barriers  Joint Problems;Arthritis;Muscular Weakness    Cardiac Risk Stratification  High       6 Minute Walk: 6 Minute Walk    Row Name 11/10/18 1536         6 Minute Walk   Phase  Initial     Distance  912 feet     Walk Time  6 minutes     # of Rest Breaks  0     MPH  1.73     METS  2.97     RPE  14     Perceived Dyspnea   2     VO2 Peak  10.41     Symptoms  No     Resting HR  76 bpm     Resting BP  106/64     Resting Oxygen Saturation   97 %     Exercise Oxygen Saturation  during 6 min walk  88 %     Max Ex. HR  98 bpm     Max Ex. BP  116/68     2 Minute Post BP  126/68        Oxygen Initial Assessment:   Oxygen Re-Evaluation:   Oxygen Discharge (Final Oxygen Re-Evaluation):   Initial Exercise Prescription: Initial Exercise Prescription - 11/10/18 1500      Date of Initial Exercise RX and Referring Provider   Date  11/10/18    Referring Provider  [facility]      Treadmill   MPH  1.8    Grade  1.5    Minutes  15    METs  2.75      Recumbant Bike   Level  3    RPM  60    Watts  32    Minutes  15    METs  2.97      T5 Nustep   SPM  80    Minutes  15    METs  2.9      Prescription Details   Duration  Progress to 30 minutes of continuous aerobic  without signs/symptoms of physical distress      Intensity   THRR 40-80% of Max Heartrate  112-148    Ratings of Perceived Exertion  11-13    Perceived Dyspnea  0-4      Resistance Training   Training Prescription  Yes    Weight  4 lb    Reps  10-15       Perform Capillary Blood Glucose checks as needed.  Exercise Prescription Changes: Exercise Prescription Changes    Row Name 11/10/18 1500 11/24/18 1700           Response to Exercise   Blood Pressure (Admit)  106/64  124/66      Blood Pressure (Exercise)  116/68  134/58      Blood Pressure (Exit)  126/68  122/66      Heart Rate (Admit)  76 bpm  85 bpm      Heart Rate (Exercise)  98 bpm  105 bpm      Heart Rate (Exit)  97 bpm  78 bpm      Oxygen Saturation (Admit)  98 %  -      Oxygen Saturation (Exercise)  97 %  -  Oxygen Saturation (Exit)  88 %  -      Rating of Perceived Exertion (Exercise)  14  12      Perceived Dyspnea (Exercise)  2  -      Symptoms  -  none      Comments  -  first full day of exercise upon return to program      Duration  -  Continue with 30 min of aerobic exercise without signs/symptoms of physical distress.      Intensity  -  THRR unchanged        Progression   Progression  -  Continue to progress workloads to maintain intensity without signs/symptoms of physical distress.      Average METs  -  2.63        Resistance Training   Training Prescription  -  Yes      Weight  -  4 lbs      Reps  -  10-15        Interval Training   Interval Training  -  No        Treadmill   MPH  -  1.8      Grade  -  1      Minutes  -  15      METs  -  2.63        Recumbant Bike   Level  -  3      Minutes  -  15         Exercise Comments: Exercise Comments    Row Name 11/20/18 0915           Exercise Comments   First full day of exercise with returning!  Patient was oriented to gym and equipment including functions, settings, policies, and procedures.  Patient's individual exercise  prescription and treatment plan were reviewed.  All starting workloads were established based on the results of the 6 minute walk test done at initial orientation visit.  The plan for exercise progression was also introduced and progression will be customized based on patient's performance and goals.          Exercise Goals and Review: Exercise Goals    Row Name 11/10/18 1535             Exercise Goals   Increase Physical Activity  Yes       Intervention  Provide advice, education, support and counseling about physical activity/exercise needs.;Develop an individualized exercise prescription for aerobic and resistive training based on initial evaluation findings, risk stratification, comorbidities and participant's personal goals.       Expected Outcomes  Short Term: Attend rehab on a regular basis to increase amount of physical activity.;Long Term: Add in home exercise to make exercise part of routine and to increase amount of physical activity.;Long Term: Exercising regularly at least 3-5 days a week.       Increase Strength and Stamina  Yes       Intervention  Provide advice, education, support and counseling about physical activity/exercise needs.;Develop an individualized exercise prescription for aerobic and resistive training based on initial evaluation findings, risk stratification, comorbidities and participant's personal goals.       Expected Outcomes  Short Term: Increase workloads from initial exercise prescription for resistance, speed, and METs.;Short Term: Perform resistance training exercises routinely during rehab and add in resistance training at home;Long Term: Improve cardiorespiratory fitness, muscular endurance and strength as measured by increased METs and functional capacity (6MWT)  Able to understand and use rate of perceived exertion (RPE) scale  Yes       Intervention  Provide education and explanation on how to use RPE scale       Expected Outcomes  Short Term:  Able to use RPE daily in rehab to express subjective intensity level;Long Term:  Able to use RPE to guide intensity level when exercising independently       Able to understand and use Dyspnea scale  Yes       Intervention  Provide education and explanation on how to use Dyspnea scale       Expected Outcomes  Short Term: Able to use Dyspnea scale daily in rehab to express subjective sense of shortness of breath during exertion;Long Term: Able to use Dyspnea scale to guide intensity level when exercising independently       Knowledge and understanding of Target Heart Rate Range (THRR)  Yes       Intervention  Provide education and explanation of THRR including how the numbers were predicted and where they are located for reference       Expected Outcomes  Short Term: Able to state/look up THRR;Short Term: Able to use daily as guideline for intensity in rehab;Long Term: Able to use THRR to govern intensity when exercising independently       Able to check pulse independently  Yes       Intervention  Provide education and demonstration on how to check pulse in carotid and radial arteries.;Review the importance of being able to check your own pulse for safety during independent exercise       Expected Outcomes  Short Term: Able to explain why pulse checking is important during independent exercise;Long Term: Able to check pulse independently and accurately       Understanding of Exercise Prescription  Yes       Intervention  Provide education, explanation, and written materials on patient's individual exercise prescription       Expected Outcomes  Short Term: Able to explain program exercise prescription;Long Term: Able to explain home exercise prescription to exercise independently          Exercise Goals Re-Evaluation : Exercise Goals Re-Evaluation    Row Name 11/20/18 0923             Exercise Goal Re-Evaluation   Exercise Goals Review  Increase Physical Activity;Increase Strength and  Stamina;Able to understand and use rate of perceived exertion (RPE) scale;Knowledge and understanding of Target Heart Rate Range (THRR);Understanding of Exercise Prescription       Comments  Reviewed RPE scale, THR and program prescription with pt today.  Pt voiced understanding and was given a copy of goals to take home.        Expected Outcomes  Short: Use RPE daily to regulate intensity. Long: Follow program prescription in THR.          Discharge Exercise Prescription (Final Exercise Prescription Changes): Exercise Prescription Changes - 11/24/18 1700      Response to Exercise   Blood Pressure (Admit)  124/66    Blood Pressure (Exercise)  134/58    Blood Pressure (Exit)  122/66    Heart Rate (Admit)  85 bpm    Heart Rate (Exercise)  105 bpm    Heart Rate (Exit)  78 bpm    Rating of Perceived Exertion (Exercise)  12    Symptoms  none    Comments  first full day of exercise upon return to program  Duration  Continue with 30 min of aerobic exercise without signs/symptoms of physical distress.    Intensity  THRR unchanged      Progression   Progression  Continue to progress workloads to maintain intensity without signs/symptoms of physical distress.    Average METs  2.63      Resistance Training   Training Prescription  Yes    Weight  4 lbs    Reps  10-15      Interval Training   Interval Training  No      Treadmill   MPH  1.8    Grade  1    Minutes  15    METs  2.63      Recumbant Bike   Level  3    Minutes  15       Nutrition:  Target Goals: Understanding of nutrition guidelines, daily intake of sodium <1568m, cholesterol <2055m calories 30% from fat and 7% or less from saturated fats, daily to have 5 or more servings of fruits and vegetables.  Biometrics: Pre Biometrics - 11/10/18 1534      Pre Biometrics   Height  '6\' 1"'  (1.854 m)    Weight  223 lb 12.8 oz (101.5 kg)    Waist Circumference  41 inches    Hip Circumference  44 inches    Waist to Hip Ratio   0.93 %    BMI (Calculated)  29.53    Single Leg Stand  12.78 seconds        Nutrition Therapy Plan and Nutrition Goals: Nutrition Therapy & Goals - 11/10/18 1452      Intervention Plan   Intervention  Prescribe, educate and counsel regarding individualized specific dietary modifications aiming towards targeted core components such as weight, hypertension, lipid management, diabetes, heart failure and other comorbidities.    Expected Outcomes  Short Term Goal: Understand basic principles of dietary content, such as calories, fat, sodium, cholesterol and nutrients.;Short Term Goal: A plan has been developed with personal nutrition goals set during dietitian appointment.;Long Term Goal: Adherence to prescribed nutrition plan.       Nutrition Assessments: Nutrition Assessments - 11/10/18 1452      MEDFICTS Scores   Pre Score  30       Nutrition Goals Re-Evaluation:   Nutrition Goals Discharge (Final Nutrition Goals Re-Evaluation):   Psychosocial: Target Goals: Acknowledge presence or absence of significant depression and/or stress, maximize coping skills, provide positive support system. Participant is able to verbalize types and ability to use techniques and skills needed for reducing stress and depression.   Initial Review & Psychosocial Screening: Initial Psych Review & Screening - 11/10/18 1446      Initial Review   Current issues with  Current Sleep Concerns;Current Stress Concerns   issues with shortness of breath while sleeping   Source of Stress Concerns  Transportation;Unable to perform yard/household activities      [place] Yes   family, friends     Screening Interventions   Interventions  Encouraged to exercise;Program counselor consult;To provide support and resources with identified psychosocial needs;Provide feedback about the scores to participant    Expected Outcomes  Short Term goal: Utilizing psychosocial counselor, staff  and physician to assist with identification of specific Stressors or current issues interfering with healing process. Setting desired goal for each stressor or current issue identified.;Long Term Goal: Stressors or current issues are controlled or eliminated.;Short Term goal: Identification and review with participant of  any Quality of Life or Depression concerns found by scoring the questionnaire.;Long Term goal: The participant improves quality of Life and PHQ9 Scores as seen by post scores and/or verbalization of changes       Quality of Life Scores:  Quality of Life - 11/10/18 1448      Quality of Life   Select  Quality of Life      Quality of Life Scores   Health/Function Pre  21.03 %    Socioeconomic Pre  25.31 %    Psych/Spiritual Pre  28.29 %    Family Pre  21.6 %    GLOBAL Pre  23.54 %      Scores of 19 and below usually indicate a poorer quality of life in these areas.  A difference of  2-3 points is a clinically meaningful difference.  A difference of 2-3 points in the total score of the Quality of Life Index has been associated with significant improvement in overall quality of life, self-image, physical symptoms, and general health in studies assessing change in quality of life.  PHQ-9: Recent Review Flowsheet Data    Depression screen [facility] 2/9 11/10/2018 01/23/2018 10/03/2017 09/30/2017 08/29/2017   Decreased Interest 3 1 0 0 0   Down, Depressed, Hopeless 0 0 0 0 0   PHQ - 2 Score 3 1 0 0 0   Altered sleeping 3 2 - - -   Tired, decreased energy 2 1 - - -   Change in appetite 0 1 - - -   Feeling bad or failure about yourself  0 0 - - -   Trouble concentrating 0 0 - - -   Moving slowly or fidgety/restless 1 0 - - -   Suicidal thoughts 0 0 - - -   PHQ-9 Score 9 5 - - -   Difficult doing work/chores Somewhat difficult Not difficult at all - - -     Interpretation of Total Score  Total Score Depression Severity:  1-4 = Minimal depression, 5-9 = Mild depression, 10-14 =  Moderate depression, 15-19 = Moderately severe depression, 20-27 = Severe depression   Psychosocial Evaluation and Intervention:   Psychosocial Re-Evaluation:   Psychosocial Discharge (Final Psychosocial Re-Evaluation):   Vocational Rehabilitation: Provide vocational rehab assistance to qualifying candidates.   Vocational Rehab Evaluation & Intervention: Vocational Rehab - 11/10/18 1446      Initial Vocational Rehab Evaluation & Intervention   Assessment shows need for Vocational Rehabilitation  No       Education: Education Goals: Education classes will be provided on a variety of topics geared toward better understanding of heart health and risk factor modification. Participant will state understanding/return demonstration of topics presented as noted by education test scores.  Learning Barriers/Preferences: Learning Barriers/Preferences - 11/10/18 1431      Learning Barriers/Preferences   Learning Barriers  None    Learning Preferences  Group Instruction;Skilled Demonstration       Education Topics:  AED/CPR: - Group verbal and written instruction with the use of models to demonstrate the basic use of the AED with the basic ABC's of resuscitation.   Cardiac Rehab from 03/13/2018 in [facility] Cardiac and Pulmonary Rehab  Date  02/11/18  Educator  SB  Instruction Review Code  1- Verbalizes Understanding      General Nutrition Guidelines/Fats and Fiber: -Group instruction provided by verbal, written material, models and posters to present the general guidelines for heart healthy nutrition. Gives an explanation and review of dietary fats and  fiber.   Controlling Sodium/Reading Food Labels: -Group verbal and written material supporting the discussion of sodium use in heart healthy nutrition. Review and explanation with models, verbal and written materials for utilization of the food label.   Cardiac Rehab from 03/13/2018 in [facility] Cardiac and Pulmonary Rehab  Date  02/04/18   Educator  CR  Instruction Review Code  1- Verbalizes Understanding      Exercise Physiology & General Exercise Guidelines: - Group verbal and written instruction with models to review the exercise physiology of the cardiovascular system and associated critical values. Provides general exercise guidelines with specific guidelines to those with heart or lung disease.    Cardiac Rehab from 03/13/2018 in [facility] Cardiac and Pulmonary Rehab  Date  02/20/18  Educator  [facility]  Instruction Review Code  1- Verbalizes Understanding      Aerobic Exercise & Resistance Training: - Gives group verbal and written instruction on the various components of exercise. Focuses on aerobic and resistive training programs and the benefits of this training and how to safely progress through these programs..   Cardiac Rehab from 12/02/2018 in [facility] Cardiac and Pulmonary Rehab  Date  11/20/18  Educator  AS  Instruction Review Code  1- Verbalizes Understanding      Flexibility, Balance, Mind/Body Relaxation: Provides group verbal/written instruction on the benefits of flexibility and balance training, including mind/body exercise modes such as yoga, pilates and tai chi.  Demonstration and skill practice provided.   Stress and Anxiety: - Provides group verbal and written instruction about the health risks of elevated stress and causes of high stress.  Discuss the correlation between heart/lung disease and anxiety and treatment options. Review healthy ways to manage with stress and anxiety.   Cardiac Rehab from 03/13/2018 in [facility] Cardiac and Pulmonary Rehab  Date  03/04/18  Educator  [facility]  Instruction Review Code  1- Verbalizes Understanding      Depression: - Provides group verbal and written instruction on the correlation between heart/lung disease and depressed mood, treatment options, and the stigmas associated with seeking treatment.   Cardiac Rehab from 03/13/2018 in [facility] Cardiac and Pulmonary Rehab  Date   02/18/18  Educator  [facility]  Instruction Review Code  1- Verbalizes Understanding      Anatomy & Physiology of the Heart: - Group verbal and written instruction and models provide basic cardiac anatomy and physiology, with the coronary electrical and arterial systems. Review of Valvular disease and Heart Failure   Cardiac Procedures: - Group verbal and written instruction to review commonly prescribed medications for heart disease. Reviews the medication, class of the drug, and side effects. Includes the steps to properly store meds and maintain the prescription regimen. (beta blockers and nitrates)   Cardiac Medications I: - Group verbal and written instruction to review commonly prescribed medications for heart disease. Reviews the medication, class of the drug, and side effects. Includes the steps to properly store meds and maintain the prescription regimen.   Cardiac Rehab from 03/13/2018 in [facility] Cardiac and Pulmonary Rehab  Date  03/11/18  Educator  SB  Instruction Review Code  1- Verbalizes Understanding      Cardiac Medications II: -Group verbal and written instruction to review commonly prescribed medications for heart disease. Reviews the medication, class of the drug, and side effects. (all other drug classes)   Cardiac Rehab from 12/02/2018 in [facility] Cardiac and Pulmonary Rehab  Date  12/02/18  Educator  SB  Instruction Review Code  1- Verbalizes Understanding  Go Sex-Intimacy & Heart Disease, Get SMART - Goal Setting: - Group verbal and written instruction through game format to discuss heart disease and the return to sexual intimacy. Provides group verbal and written material to discuss and apply goal setting through the application of the S.M.A.R.T. Method.   Other Matters of the Heart: - Provides group verbal, written materials and models to describe Stable Angina and Peripheral Artery. Includes description of the disease process and treatment options available to the  cardiac patient.   Exercise & Equipment Safety: - Individual verbal instruction and demonstration of equipment use and safety with use of the equipment.   Cardiac Rehab from 12/02/2018 in [facility] Cardiac and Pulmonary Rehab  Date  11/10/18  Educator  [facility]  Instruction Review Code  1- Verbalizes Understanding      Infection Prevention: - Provides verbal and written material to individual with discussion of infection control including proper hand washing and proper equipment cleaning during exercise session.   Cardiac Rehab from 12/02/2018 in [facility] Cardiac and Pulmonary Rehab  Date  11/10/18  Educator  [facility]  Instruction Review Code  1- Verbalizes Understanding      Falls Prevention: - Provides verbal and written material to individual with discussion of falls prevention and safety.   Cardiac Rehab from 12/02/2018 in [facility] Cardiac and Pulmonary Rehab  Date  11/10/18  Educator  [facility]  Instruction Review Code  1- Verbalizes Understanding      Diabetes: - Individual verbal and written instruction to review signs/symptoms of diabetes, desired ranges of glucose level fasting, after meals and with exercise. Acknowledge that pre and post exercise glucose checks will be done for 3 sessions at entry of program.   Know Your Numbers and Risk Factors: -Group verbal and written instruction about important numbers in your health.  Discussion of what are risk factors and how they play a role in the disease process.  Review of Cholesterol, Blood Pressure, Diabetes, and BMI and the role they play in your overall health.   Cardiac Rehab from 12/02/2018 in [facility] Cardiac and Pulmonary Rehab  Date  12/02/18  Educator  SB  Instruction Review Code  1- Verbalizes Understanding      Sleep Hygiene: -Provides group verbal and written instruction about how sleep can affect your health.  Define sleep hygiene, discuss sleep cycles and impact of sleep habits. Review good sleep hygiene tips.    Cardiac Rehab from 03/13/2018  in [facility] Cardiac and Pulmonary Rehab  Date  01/30/18  Educator  [facility]  Instruction Review Code  1- Verbalizes Understanding      Other: -Provides group and verbal instruction on various topics (see comments)   Knowledge Questionnaire Score: Knowledge Questionnaire Score - 11/10/18 1444      Knowledge Questionnaire Score   Pre Score  13/26   correct answers reviewed with [name]. focus on nutrition, exercise, Angina      Core Components/Risk Factors/Patient Goals at Admission: Personal Goals and Risk Factors at Admission - 11/10/18 1428      Core Components/Risk Factors/Patient Goals on Admission    Weight Management  Yes;Weight Maintenance    Intervention  Weight Management: Develop a combined nutrition and exercise program designed to reach desired caloric intake, while maintaining appropriate intake of nutrient and fiber, sodium and fats, and appropriate energy expenditure required for the weight goal.;Weight Management: Provide education and appropriate resources to help participant work on and attain dietary goals.    Admit Weight  225 lb (102.1 kg)  Heart Failure  Yes    Intervention  Provide a combined exercise and nutrition program that is supplemented with education, support and counseling about heart failure. Directed toward relieving symptoms such as shortness of breath, decreased exercise tolerance, and extremity edema.    Expected Outcomes  Improve functional capacity of life;Short term: Attendance in program 2-3 days a week with increased exercise capacity. Reported lower sodium intake. Reported increased fruit and vegetable intake. Reports medication compliance.;Short term: Daily weights obtained and reported for increase. Utilizing diuretic protocols set by physician.;Long term: Adoption of self-care skills and reduction of barriers for early signs and symptoms recognition and intervention leading to self-care maintenance.    Lipids  Yes    Intervention  Provide education and  support for participant on nutrition & aerobic/resistive exercise along with prescribed medications to achieve LDL <51m, HDL >492m    Expected Outcomes  Short Term: Participant states understanding of desired cholesterol values and is compliant with medications prescribed. Participant is following exercise prescription and nutrition guidelines.;Long Term: Cholesterol controlled with medications as prescribed, with individualized exercise RX and with personalized nutrition plan. Value goals: LDL < 7095mHDL > 40 mg.       Core Components/Risk Factors/Patient Goals Review:    Core Components/Risk Factors/Patient Goals at Discharge (Final Review):    ITP Comments: ITP Comments    Row Name 11/10/18 1400 12/03/18 0617         ITP Comments  Med Review completed. Initial ITP created. Diagnosis can be found [place]  30 day review. Continue with ITP unless direccted changes per Medical Director Chart Review. New to program         Comments:

## 2018-12-03 NOTE — ED Notes (Signed)
Pt voided 300cc urine

## 2018-12-04 ENCOUNTER — Telehealth: Payer: Self-pay

## 2018-12-04 ENCOUNTER — Inpatient Hospital Stay: Payer: Commercial Managed Care - PPO

## 2018-12-04 DIAGNOSIS — J9600 Acute respiratory failure, unspecified whether with hypoxia or hypercapnia: Secondary | ICD-10-CM

## 2018-12-04 DIAGNOSIS — J9621 Acute and chronic respiratory failure with hypoxia: Secondary | ICD-10-CM | POA: Diagnosis present

## 2018-12-04 LAB — MRSA PCR SCREENING: MRSA by PCR: NEGATIVE

## 2018-12-04 LAB — URINALYSIS, ROUTINE W REFLEX MICROSCOPIC
Bilirubin Urine: NEGATIVE
Glucose, UA: NEGATIVE mg/dL
Hgb urine dipstick: NEGATIVE
Ketones, ur: NEGATIVE mg/dL
LEUKOCYTES UA: NEGATIVE
Nitrite: NEGATIVE
PROTEIN: NEGATIVE mg/dL
Specific Gravity, Urine: 1.005 (ref 1.005–1.030)
pH: 5 (ref 5.0–8.0)

## 2018-12-04 LAB — CBC
HCT: 41.8 % (ref 39.0–52.0)
HEMOGLOBIN: 13.7 g/dL (ref 13.0–17.0)
MCH: 28.5 pg (ref 26.0–34.0)
MCHC: 32.8 g/dL (ref 30.0–36.0)
MCV: 87.1 fL (ref 80.0–100.0)
Platelets: 272 10*3/uL (ref 150–400)
RBC: 4.8 MIL/uL (ref 4.22–5.81)
RDW: 16.5 % — ABNORMAL HIGH (ref 11.5–15.5)
WBC: 12.9 10*3/uL — ABNORMAL HIGH (ref 4.0–10.5)
nRBC: 0 % (ref 0.0–0.2)

## 2018-12-04 LAB — BASIC METABOLIC PANEL
Anion gap: 10 (ref 5–15)
BUN: 40 mg/dL — ABNORMAL HIGH (ref 6–20)
CO2: 30 mmol/L (ref 22–32)
Calcium: 8.8 mg/dL — ABNORMAL LOW (ref 8.9–10.3)
Chloride: 100 mmol/L (ref 98–111)
Creatinine, Ser: 1.88 mg/dL — ABNORMAL HIGH (ref 0.61–1.24)
GFR calc Af Amer: 46 mL/min — ABNORMAL LOW (ref >60)
GFR calc non Af Amer: 40 mL/min — ABNORMAL LOW (ref >60)
Glucose, Bld: 114 mg/dL — ABNORMAL HIGH (ref 70–99)
Potassium: 3.5 mmol/L (ref 3.5–5.1)
Sodium: 140 mmol/L (ref 135–145)

## 2018-12-04 LAB — TROPONIN I
Troponin I: 0.08 ng/mL (ref <0.03)
Troponin I: 0.09 ng/mL (ref <0.03)
Troponin I: 0.08 ng/mL (ref <0.03)

## 2018-12-04 LAB — EXPECTORATED SPUTUM ASSESSMENT W GRAM STAIN, RFLX TO RESP C

## 2018-12-04 LAB — LACTIC ACID, PLASMA: Lactic Acid, Venous: 1.2 mmol/L (ref 0.5–1.9)

## 2018-12-04 LAB — GLUCOSE, CAPILLARY: Glucose-Capillary: 102 mg/dL — ABNORMAL HIGH (ref 70–99)

## 2018-12-04 LAB — STREP PNEUMONIAE URINARY ANTIGEN: Strep Pneumo Urinary Antigen: NEGATIVE

## 2018-12-04 LAB — SEDIMENTATION RATE: Sed Rate: 41 mm/hr — ABNORMAL HIGH (ref 0–20)

## 2018-12-04 LAB — PROCALCITONIN: Procalcitonin: 0.74 ng/mL

## 2018-12-04 MED ORDER — MELOXICAM 7.5 MG PO TABS
15.0000 mg | ORAL_TABLET | Freq: Every day | ORAL | Status: DC
  Administered 2018-12-06 – 2018-12-10 (×4): 15 mg via ORAL
  Filled 2018-12-04 (×7): qty 2

## 2018-12-04 MED ORDER — HEPARIN SODIUM (PORCINE) 5000 UNIT/ML IJ SOLN
5000.0000 [IU] | Freq: Three times a day (TID) | INTRAMUSCULAR | Status: DC
  Administered 2018-12-04 – 2018-12-10 (×18): 5000 [IU] via SUBCUTANEOUS
  Filled 2018-12-04 (×18): qty 1

## 2018-12-04 MED ORDER — TIOTROPIUM BROMIDE MONOHYDRATE 18 MCG IN CAPS
18.0000 ug | ORAL_CAPSULE | Freq: Every day | RESPIRATORY_TRACT | Status: DC
  Administered 2018-12-04 – 2018-12-10 (×7): 18 ug via RESPIRATORY_TRACT
  Filled 2018-12-04 (×2): qty 5

## 2018-12-04 MED ORDER — ONDANSETRON HCL 4 MG/2ML IJ SOLN
4.0000 mg | Freq: Four times a day (QID) | INTRAMUSCULAR | Status: DC | PRN
  Administered 2018-12-05 – 2018-12-09 (×2): 4 mg via INTRAVENOUS
  Filled 2018-12-04 (×2): qty 2

## 2018-12-04 MED ORDER — MOMETASONE FURO-FORMOTEROL FUM 200-5 MCG/ACT IN AERO
2.0000 | INHALATION_SPRAY | Freq: Two times a day (BID) | RESPIRATORY_TRACT | Status: DC
  Administered 2018-12-04 – 2018-12-10 (×13): 2 via RESPIRATORY_TRACT
  Filled 2018-12-04: qty 8.8

## 2018-12-04 MED ORDER — SODIUM CHLORIDE 0.9 % IV SOLN
2.0000 g | Freq: Two times a day (BID) | INTRAVENOUS | Status: DC
  Administered 2018-12-04 – 2018-12-08 (×9): 2 g via INTRAVENOUS
  Filled 2018-12-04 (×12): qty 2

## 2018-12-04 MED ORDER — ORAL CARE MOUTH RINSE: 15.0000 mL | Freq: Two times a day (BID) | OROMUCOSAL | Status: DC

## 2018-12-04 MED ORDER — BUMETANIDE 0.25 MG/ML IJ SOLN
2.0000 mg | Freq: Once | INTRAMUSCULAR | Status: AC
  Administered 2018-12-04: 2 mg via INTRAVENOUS
  Filled 2018-12-04: qty 8

## 2018-12-04 MED ORDER — CYCLOBENZAPRINE HCL 10 MG PO TABS
5.0000 mg | ORAL_TABLET | Freq: Three times a day (TID) | ORAL | Status: DC | PRN
  Administered 2018-12-05: 5 mg via ORAL
  Filled 2018-12-04: qty 1
  Filled 2018-12-04: qty 0.5

## 2018-12-04 MED ORDER — CARVEDILOL 3.125 MG PO TABS
3.1250 mg | ORAL_TABLET | Freq: Two times a day (BID) | ORAL | Status: DC
  Administered 2018-12-04 – 2018-12-10 (×11): 3.125 mg via ORAL
  Filled 2018-12-04 (×12): qty 1

## 2018-12-04 MED ORDER — COLCHICINE 0.6 MG PO TABS
0.6000 mg | ORAL_TABLET | Freq: Every day | ORAL | Status: DC | PRN
  Administered 2018-12-05: 0.6 mg via ORAL
  Filled 2018-12-04 (×2): qty 1

## 2018-12-04 MED ORDER — SODIUM CHLORIDE 0.9 % IV SOLN
500.0000 mg | INTRAVENOUS | Status: DC
  Filled 2018-12-04: qty 500

## 2018-12-04 MED ORDER — SACUBITRIL-VALSARTAN 24-26 MG PO TABS
1.0000 | ORAL_TABLET | Freq: Two times a day (BID) | ORAL | Status: DC
  Administered 2018-12-04 – 2018-12-10 (×10): 1 via ORAL
  Filled 2018-12-04 (×14): qty 1

## 2018-12-04 MED ORDER — ACETAMINOPHEN 325 MG PO TABS
650.0000 mg | ORAL_TABLET | Freq: Four times a day (QID) | ORAL | Status: DC | PRN
  Administered 2018-12-04 – 2018-12-05 (×3): 650 mg via ORAL
  Filled 2018-12-04 (×3): qty 2

## 2018-12-04 MED ORDER — ALLOPURINOL 100 MG PO TABS
200.0000 mg | ORAL_TABLET | Freq: Every day | ORAL | Status: DC
  Administered 2018-12-05 – 2018-12-08 (×4): 200 mg via ORAL
  Filled 2018-12-04 (×7): qty 2

## 2018-12-04 MED ORDER — ONDANSETRON HCL 4 MG PO TABS: 4.0000 mg | ORAL_TABLET | Freq: Four times a day (QID) | ORAL | Status: DC | PRN

## 2018-12-04 MED ORDER — ACETAMINOPHEN 650 MG RE SUPP: 650.0000 mg | Freq: Four times a day (QID) | RECTAL | Status: DC | PRN

## 2018-12-04 MED ORDER — AMIODARONE HCL 200 MG PO TABS
300.0000 mg | ORAL_TABLET | Freq: Every day | ORAL | Status: DC
  Administered 2018-12-04: 300 mg via ORAL
  Filled 2018-12-04 (×3): qty 1

## 2018-12-04 MED ORDER — ASPIRIN EC 81 MG PO TBEC
81.0000 mg | DELAYED_RELEASE_TABLET | Freq: Every day | ORAL | Status: DC
  Administered 2018-12-04 – 2018-12-10 (×7): 81 mg via ORAL
  Filled 2018-12-04 (×7): qty 1

## 2018-12-04 MED ORDER — VANCOMYCIN HCL 10 G IV SOLR
1250.0000 mg | Freq: Two times a day (BID) | INTRAVENOUS | Status: DC
  Administered 2018-12-04: 1250 mg via INTRAVENOUS
  Filled 2018-12-04 (×2): qty 1250

## 2018-12-04 MED ORDER — FUROSEMIDE 10 MG/ML IJ SOLN
60.0000 mg | Freq: Two times a day (BID) | INTRAMUSCULAR | Status: DC
  Administered 2018-12-04 – 2018-12-06 (×4): 60 mg via INTRAVENOUS
  Filled 2018-12-04 (×4): qty 6

## 2018-12-04 MED ORDER — POTASSIUM CHLORIDE CRYS ER 20 MEQ PO TBCR
20.0000 meq | EXTENDED_RELEASE_TABLET | Freq: Two times a day (BID) | ORAL | Status: DC
  Administered 2018-12-04 – 2018-12-10 (×13): 20 meq via ORAL
  Filled 2018-12-04 (×13): qty 1

## 2018-12-04 MED ORDER — METOLAZONE 5 MG PO TABS
5.0000 mg | ORAL_TABLET | Freq: Once | ORAL | Status: AC
  Administered 2018-12-04: 5 mg via ORAL
  Filled 2018-12-04: qty 1

## 2018-12-04 MED ORDER — ADULT MULTIVITAMIN W/MINERALS CH
1.0000 | ORAL_TABLET | Freq: Every day | ORAL | Status: DC
  Administered 2018-12-04 – 2018-12-10 (×6): 1 via ORAL
  Filled 2018-12-04 (×7): qty 1

## 2018-12-04 MED ORDER — CLOPIDOGREL BISULFATE 75 MG PO TABS
75.0000 mg | ORAL_TABLET | Freq: Every day | ORAL | Status: DC
  Administered 2018-12-04 – 2018-12-10 (×7): 75 mg via ORAL
  Filled 2018-12-04 (×7): qty 1

## 2018-12-04 MED ORDER — SODIUM CHLORIDE 0.9 % IV SOLN
2.0000 g | INTRAVENOUS | Status: DC
  Filled 2018-12-04: qty 20

## 2018-12-04 MED ORDER — ALBUTEROL SULFATE (2.5 MG/3ML) 0.083% IN NEBU: 2.5000 mg | INHALATION_SOLUTION | RESPIRATORY_TRACT | Status: DC | PRN

## 2018-12-04 MED ORDER — CHLORHEXIDINE GLUCONATE 0.12 % MT SOLN
15.0000 mL | Freq: Two times a day (BID) | OROMUCOSAL | Status: DC
  Administered 2018-12-04 – 2018-12-06 (×5): 15 mL via OROMUCOSAL
  Filled 2018-12-04 (×5): qty 15

## 2018-12-04 MED ORDER — ATORVASTATIN CALCIUM 20 MG PO TABS
80.0000 mg | ORAL_TABLET | Freq: Every day | ORAL | Status: DC
  Administered 2018-12-04 – 2018-12-09 (×5): 80 mg via ORAL
  Filled 2018-12-04 (×5): qty 4

## 2018-12-04 NOTE — Consult Note (Signed)
[facility] Consultation Note  Patient ID: ABDULKAREEM BADOLATO, MRN: [id number], DOB/AGE: [birth year] 52 y.o. Admit date: 12/03/2018   Date of Consult: 12/04/2018 Primary Physician: Valerie Roys, DO Primary Cardiologist: Call would  Chief Complaint:  Chief Complaint  Patient presents with  . Shortness of Breath  . Code STEMI   Reason for Consult: Heart failure  HPI: 52 y.o. male with known cardiovascular disease with dilated cardiomyopathy chronic left bundle branch block chronic kidney disease stage III hyperlipidemia on appropriate medication management for cardiomyopathy and treatment of heart failure status post previous ICD placement.  The patient had new onset of severe shortness of breath cough and congestion for which she was seen in the emergency room.  At that time he had an EKG showing normal sinus rhythm with left bundle branch block a troponin of 0.09 and a chest x-ray consistent with diffuse get groundglass changes consistent with diffuse pneumonia.  There was some pulmonary edema as well.  The patient has done well with BiPAP with oxygenation and is improved also with intravenous Lasix.  The patient now is off BiPAP on oxygen and there is no current evidence of myocardial infarction  Past Medical History:  Diagnosis Date  . AICD (automatic cardioverter/defibrillator) present   . AKI (acute kidney injury) ([place]) 12/24/2017  . Arrhythmia   . Arthritis    "lower back, knees" (06/10/2018)  . Cardiac arrest ([place]) 12/23/2017   Brief V-fib arrest  . Chest pain 09/08/2017  . CHF (congestive heart failure) (H[facility])    nonischemic cardiomyopathy, EF 25%  . Gout    "on daily RX" (06/10/2018)  . Headache    "q couple months" (06/10/2018)  . High cholesterol   . History of kidney stones   . Hypertension   . Influenza A 12/24/2017  . NICM (nonischemic cardiomyopathy) ([place])   . Pneumonia 12/20/2017  . TIA (transient ischemic attack) 06/21/2016   "still affects my memory  a little bit" (06/10/2018)      Surgical History:  Past Surgical History:  Procedure Laterality Date  . EXTERNAL FIXATION LEG Right ~ 2000   "was going bowlegged; had to brake my leg to fix it"  . HERNIA REPAIR    . ICD IMPLANT N/A 12/30/2017   Procedure: ICD IMPLANT;  Surgeon: Deboraha Sprang, MD;  Location: [place] [facility];  Service: Cardiovascular;  Laterality: N/A;  . LEFT HEART CATH AND CORONARY ANGIOGRAPHY N/A 09/09/2017   Procedure: LEFT HEART CATH AND CORONARY ANGIOGRAPHY;  Surgeon: Teodoro Spray, MD;  Location: [place] [facility];  Service: Cardiovascular;  Laterality: N/A;  . UMBILICAL HERNIA REPAIR  1990s  . V TACH ABLATION  06/10/2018  . V TACH ABLATION N/A 06/10/2018   Procedure: V TACH ABLATION;  Surgeon: Thompson Grayer, MD;  Location: [place] [facility];  Service: Cardiovascular;  Laterality: N/A;  . VASECTOMY       Home Meds: Prior to Admission medications   Medication Sig Start Date End Date Taking? Authorizing Provider  acetaminophen (TYLENOL) 325 MG tablet Take 2 tablets (650 mg total) by mouth every 6 (six) hours as needed. Patient taking differently: Take 325-650 mg by mouth every 6 (six) hours as needed for moderate pain.  03/19/18  Yes Carrie Mew, MD  albuterol (PROVENTIL HFA;VENTOLIN HFA) 108 (90 Base) MCG/ACT inhaler Inhale 2 puffs into the lungs every 4 (four) hours as needed for wheezing or shortness of breath. 09/19/18  Yes Johnson, Megan P, DO  allopurinol (ZYLOPRIM) 100  MG tablet Take 2 tablets (200 mg total) by mouth daily. 10/19/18  Yes Johnson, Megan P, DO  amiodarone (PACERONE) 200 MG tablet Take 300 mg by mouth daily.   Yes [provider]  aspirin EC 81 MG EC tablet Take 1 tablet (81 mg total) by mouth daily. 01/01/18  Yes Daune Perch, NP  atorvastatin (LIPITOR) 40 MG tablet Take 80 mg by mouth daily at 6 PM.  10/29/18  Yes [provider]  carvedilol (COREG) 3.125 MG tablet Take 1 tablet (3.125 mg total) by mouth 2 (two)  times daily with a meal. 07/06/18  Yes Wieting, Richard, MD  clopidogrel (PLAVIX) 75 MG tablet Take 1 tablet (75 mg total) by mouth daily. 01/30/18  Yes Bensimhon, Shaune Pascal, MD  colchicine 0.6 MG tablet Take 1 tablet (0.6 mg total) by mouth daily as needed (gout flare). 09/03/18  Yes Johnson, Megan P, DO  cyclobenzaprine (FLEXERIL) 5 MG tablet Take 1 tablet (5 mg total) by mouth 3 (three) times daily as needed (jaw pain). 11/24/18  Yes Nance Pear, MD  diclofenac sodium (VOLTAREN) 1 % GEL Apply 2 g topically 4 (four) times daily as needed (pain).    Yes [provider]  fluticasone (FLONASE) 50 MCG/ACT nasal spray Place 2 sprays into both nostrils daily. Patient taking differently: Place 2 sprays into both nostrils daily as needed for allergies.  06/20/17  Yes [name], MD  fluticasone-salmeterol (ADVAIR HFA) [phone] MCG/ACT inhaler Inhale 2 puffs into the lungs 2 (two) times daily. 08/12/18  Yes Kasa, Maretta Bees, MD  Menthol, Topical Analgesic, (ICY HOT EX) Apply 1 application topically daily as needed (pain).   Yes [provider]  metolazone (ZAROXOLYN) 2.5 MG tablet Take 1 tablet (2.5 mg total) by mouth once daily TAKE 30 MINUTES PRIOR TO LASIX 09/05/18  Yes [provider]  potassium chloride SA (K-DUR,KLOR-CON) 20 MEQ tablet Take 1 tablet (20 mEq total) by mouth 2 (two) times daily. Patient taking differently: Take 20 mEq by mouth 4 (four) times daily.  07/06/18  Yes Wieting, Richard, MD  promethazine (PHENERGAN) 12.5 MG tablet Take 1 tablet (12.5 mg total) by mouth every 6 (six) hours as needed for nausea or vomiting. 11/24/18  Yes Nance Pear, MD  sacubitril-valsartan (ENTRESTO) 49-51 MG Take 0.5 tablets by mouth 2 (two) times daily.    Yes [provider]  sildenafil (REVATIO) 20 MG tablet Take by mouth as needed.  10/29/18 10/29/19 Yes [provider]  Tiotropium Bromide Monohydrate (SPIRIVA RESPIMAT) 2.5 MCG/ACT AERS Inhale 1 puff into the lungs  daily. 08/12/18  Yes [name], MD  torsemide (DEMADEX) 20 MG tablet Take 2 tablets (40 mg total) by mouth daily. 07/06/18  Yes Wieting, Richard, MD  Camphor-Eucalyptus-Menthol (CHEST RUB) 4.8-1.2-2.6 % OINT Apply 1 application topically daily as needed (congestion).     [provider]  ciprofloxacin (CIPRO) 500 MG tablet Take 1 tablet (500 mg total) by mouth 2 (two) times daily. Patient not taking: Reported on 12/03/2018 11/13/18   Park Liter P, DO  meloxicam (MOBIC) 15 MG tablet Take 15 mg by mouth daily.    [provider]  Multiple Vitamin (MULTIVITAMIN WITH MINERALS) TABS tablet Take 1 tablet by mouth daily.    [provider]  naproxen (NAPROSYN) 500 MG tablet Take 1 tablet (500 mg total) by mouth 2 (two) times daily with a meal. Patient not taking: Reported on 12/03/2018 11/24/18 11/24/19  Nance Pear, MD  nitroGLYCERIN (NITROSTAT) 0.4 MG SL tablet Place 1  tablet (0.4 mg total) under the tongue every 5 (five) minutes as needed for chest pain. 02/19/18   Bensimhon, Shaune Pascal, MD  sacubitril-valsartan (ENTRESTO) 24-26 MG Take 1 tablet by mouth 2 (two) times daily. 07/06/18   Loletha Grayer, MD  tamsulosin (FLOMAX) 0.4 MG CAPS capsule Take 1 capsule (0.4 mg total) by mouth daily. Patient not taking: Reported on 12/03/2018 11/13/18   Valerie Roys, DO    Inpatient Medications:  . allopurinol  200 mg Oral Daily  . amiodarone  300 mg Oral Daily  . aspirin EC  81 mg Oral Daily  . atorvastatin  80 mg Oral q1800  . carvedilol  3.125 mg Oral BID WC  . chlorhexidine  15 mL Mouth Rinse BID  . clopidogrel  75 mg Oral Daily  . furosemide  60 mg Intravenous BID  . heparin  5,000 Units Subcutaneous Q8H  . mouth rinse  15 mL Mouth Rinse q12n4p  . meloxicam  15 mg Oral Daily  . mometasone-formoterol  2 puff Inhalation BID  . multivitamin with minerals  1 tablet Oral Daily  . potassium chloride SA  20 mEq Oral BID  . sacubitril-valsartan  1 tablet Oral BID  .  tiotropium  18 mcg Inhalation Daily   . ceFEPime (MAXIPIME) IV 2 g (12/04/18 0629)  . vancomycin 1,250 mg (12/04/18 1856)    Allergies:  Allergies  Allergen Reactions  . Lisinopril Cough    Social History   Socioeconomic History  . Marital status: Divorced    Spouse name: Not on file  . Number of children: Not on file  . Years of education: Not on file  . Highest education level: Not on file  Occupational History  . Not on file  Social Needs  . Financial resource strain: Not on file  . Food insecurity:    Worry: Not on file    Inability: Not on file  . Transportation needs:    Medical: Not on file    Non-medical: Not on file  Tobacco Use  . Smoking status: Former Smoker    Packs/day: 0.33    Years: 33.00    Pack years: 10.89    Types: Cigarettes    Last attempt to quit: 02/20/2016    Years since quitting: 2.7  . Smokeless tobacco: Never Used  Substance and Sexual Activity  . Alcohol use: Yes    Alcohol/week: 0.0 standard drinks    Comment: 06/10/2018 "beer once/month; if that"  . Drug use: Never  . Sexual activity: Yes  Lifestyle  . Physical activity:    Days per week: Not on file    Minutes per session: Not on file  . Stress: Not on file  Relationships  . Social connections:    Talks on phone: Not on file    Gets together: Not on file    Attends religious service: Not on file    Active member of club or organization: Not on file    Attends meetings of clubs or organizations: Not on file    Relationship status: Not on file  . Intimate partner violence:    Fear of current or ex partner: Not on file    Emotionally abused: Not on file    Physically abused: Not on file    Forced sexual activity: Not on file  Other Topics Concern  . Not on file  Social History Narrative   Independent at baseline, ambulates steadily     Family History  Problem Relation Age of  Onset  . Hypertension Mother   . Heart failure Mother   . Hypertension Father   . CAD Father    . Heart attack Father      Review of Systems Positive for shortness of breath cough congestion Negative for: General:  chills, fever, night sweats or weight changes.  Cardiovascular: PND orthopnea syncope dizziness  Dermatological skin lesions rashes Respiratory: As a day for cough congestion Urologic: Frequent urination urination at night and hematuria Abdominal: negative for nausea, vomiting, diarrhea, bright red blood per rectum, melena, or hematemesis Neurologic: negative for visual changes, and/or hearing changes  All other systems reviewed and are otherwise negative except as noted above.  Labs: Recent Labs    12/03/18 1857 12/04/18 0141 12/04/18 0612  TROPONINI 0.05* 0.08* 0.09*   Lab Results  Component Value Date   WBC 12.9 (H) 12/04/2018   HGB 13.7 12/04/2018   HCT 41.8 12/04/2018   MCV 87.1 12/04/2018   PLT 272 12/04/2018    Recent Labs  Lab 12/03/18 1857 12/04/18 0612  NA 140 140  K 3.3* 3.5  CL 97* 100  CO2 27 30  BUN 37* 40*  CREATININE 2.12* 1.88*  CALCIUM 9.2 8.8*  PROT 8.2*  --   BILITOT 0.6  --   ALKPHOS 99  --   ALT 18  --   AST 22  --   GLUCOSE 292* 114*   Lab Results  Component Value Date   CHOL 217 (H) 12/03/2018   HDL 47 12/03/2018   LDLCALC 134 (H) 12/03/2018   TRIG 179 (H) 12/03/2018   Lab Results  Component Value Date   DDIMER 1.13 (H) 09/27/2017    Radiology/Studies:  Dg Chest 2 View  Result Date: 11/24/2018 CLINICAL DATA:  Productive cough and shortness of Breath EXAM: CHEST - 2 VIEW COMPARISON:  08/19/2018 FINDINGS: Cardiac shadow remains enlarged. Defibrillator is again seen. Mild central vascular congestion is noted without significant interstitial edema. The lungs are well aerated bilaterally. No focal infiltrate or sizable effusion is seen. No acute bony abnormality is noted. IMPRESSION: Mild central vascular congestion without significant edema. Stable cardiomegaly. Electronically Signed   By: Inez Catalina M.D.   On:  11/24/2018 14:21   Ct Chest Wo Contrast  Result Date: 12/04/2018 CLINICAL DATA:  Shortness of breath. History of cardiomyopathy, CHF, pneumonia. EXAM: CT CHEST WITHOUT CONTRAST TECHNIQUE: Multidetector CT imaging of the chest was performed following the standard protocol without IV contrast. COMPARISON:  Chest radiograph December 03, 2018 and CT chest September 28, 2017 FINDINGS: CARDIOVASCULAR: The heart is moderately enlarged. No pericardial effusion. Thoracic aorta is normal course and caliber, mild calcific atherosclerosis. Pacemaker via LEFT subclavian venous approach. MEDIASTINUM/NODES: No mediastinal mass. No lymphadenopathy by CT size criteria, decreased sensitivity without contrast. LUNGS/PLEURA: Tracheobronchial tree is patent, no pneumothorax. RIGHT greater than LEFT patchy ground-glass opacities with superimposed predominately RIGHT consolidation confluent in the lower lobes with air bronchograms. Minimal RIGHT pleural effusion. Minimal apical interlobular septal thickening. UPPER ABDOMEN: Nonacute. MUSCULOSKELETAL: Nonacute. LEFT upper paraspinal lipoma within subcutaneous fat. Mild thoracic spondylosis. IMPRESSION: 1. Ground-glass opacities and patchy consolidation RIGHT greater than LEFT lungs seen with pneumonia and/or pulmonary edema. Trace RIGHT pleural effusion. 2. Moderate cardiomegaly. Aortic Atherosclerosis (ICD10-I70.0). Electronically Signed   By: Elon Alas M.D.   On: 12/04/2018 04:25   Dg Chest Portable 1 View  Result Date: 12/03/2018 CLINICAL DATA:  Shortness of breath EXAM: PORTABLE CHEST 1 VIEW COMPARISON:  11/24/2018 FINDINGS: Left AICD remains in place, unchanged. Cardiomegaly.  Diffuse severe bilateral airspace disease, right greater than left. No visible significant effusions. No acute bony abnormality. IMPRESSION: Severe diffuse bilateral airspace disease, right greater than left. This could reflect asymmetric edema or infection. Electronically Signed   By: Rolm Baptise  M.D.   On: 12/03/2018 19:34    EKG: Normal sinus rhythm with left bundle branch block  Weights: Filed Weights   12/03/18 1853 12/04/18 0351  Weight: 107 kg 107 kg     Physical Exam: Blood pressure 91/73, pulse (!) 43, temperature 98.6 F (37 C), temperature source Axillary, resp. rate (!) 22, height 6\' 1"  (1.854 m), weight 107 kg, SpO2 100 %. Body mass index is 31.12 kg/m. General: Well developed, well nourished, in no acute distress. Head eyes ears nose throat: Normocephalic, atraumatic, sclera non-icteric, no xanthomas, nares are without discharge. No apparent thyromegaly and/or mass  Lungs: Normal respiratory effort.  Diffuse wheezes, no rales, some rhonchi.  Heart: RRR with normal S1 S2. no murmur gallop, no rub, PMI is normal size and placement, carotid upstroke normal without bruit, jugular venous pressure is normal Abdomen: Soft, non-tender, non-distended with normoactive bowel sounds. No hepatomegaly. No rebound/guarding. No obvious abdominal masses. Abdominal aorta is normal size without bruit Extremities: Trace to 1+ edema. no cyanosis, no clubbing, no ulcers  Peripheral : 2+ bilateral upper extremity pulses, 2+ bilateral femoral pulses, 2+ bilateral dorsal pedal pulse Neuro: Alert and oriented. No facial asymmetry. No focal deficit. Moves all extremities spontaneously. Musculoskeletal: Normal muscle tone without kyphosis Psych:  Responds to questions appropriately with a normal affect.    Assessment: 52 year old male with chronic systolic dysfunction congestive heart failure with ejection fraction of 25% status post ICD placement on appropriate medication management with chronic kidney disease and chronic the left bundle branch block having acute pneumonia and hypoxia exacerbating heart failure  Plan: .  Continue intravenous Lasix for further risk reduction in pulmonary edema on top of pneumonia causing hypoxia 2.  Continue beta-blocker ACE and Entresto for  cardiomyopathy 3.  High intensity cholesterol therapy for cardiovascular disease risk reduction 4.  Further consideration that amiodarone may be exacerbating current condition and would consider discontinuation if pulmonology suggest that this is a primary cause by amiodarone use and/or all pneumonia 5.  No further cardiac diagnostics necessary at this time 6.  Further treatment options after above  Signed, Corey Skains M.D. [facility] 12/04/2018, 9:06 AM

## 2018-12-04 NOTE — ED Notes (Signed)
Hospitalist aware of repeat trop, pt resting comfortably at this time with bipap. Sats 95% at this time, pt states respirations are much improved and able to rest.

## 2018-12-04 NOTE — Telephone Encounter (Signed)
Sister of Tareq Dwan called - he is admitted to [facility] with heart failure and will not attend class today.

## 2018-12-04 NOTE — ED Notes (Signed)
Report called to Barbara RN

## 2018-12-04 NOTE — ED Notes (Signed)
Hospitalist in to admit 

## 2018-12-04 NOTE — Progress Notes (Signed)
Dr Drema Dallas at bedside, RN clarified with MD that patient has 3.125mg  of coreg,  60mg  IV lasix, and 300mg  of pacerone PO ordered but patient bp is soft, previous was 91/73 and HR in 60s.   MD states "yes, you can give them, I think his pressure can tolerate it"

## 2018-12-04 NOTE — Progress Notes (Signed)
Pt placed back on Bipap, sats were dropping on HFNC. Pt states wob decreased on bipap, FiO2 titrated to 40%. Will continue to monitor.

## 2018-12-04 NOTE — Consult Note (Signed)
Name: Stephen [name] MRN: [id number] DOB: June 05, 1966    ADMISSION DATE:  12/03/2018 CONSULTATION DATE:  12/04/18  REFERRING MD :  Dr. Verdell Carmine  CHIEF COMPLAINT:  Shortness of Breath  BRIEF PATIENT DESCRIPTION:  52 y.o. Male admitted with Acute Hypoxic Respiratory Failure requiring BiPAP secondary to Acute Decompensated Heart Failure and CAP.  Pt also with sepsis secondary to CAP, elevated troponin likely demand ischemia, and AKI on CKD III.  SIGNIFICANT EVENTS  12/04/18>> Admission to [facility] ICU  STUDIES:  CT Chest 12/04/18>> Ground-glass opacities and patchy consolidation RIGHT greater than LEFT lungs seen with pneumonia and/or pulmonary edema. Trace RIGHT pleural effusion. Moderate cardiomegaly.  CULTURES: Blood x2 12/04/18>> Sputum 12/04/18>> Strep Pneumo urinary antigen 12/04/18>> Legionella urinary antigen 12/04/18>>  ANTIBIOTICS: Vancomycin 12/5>> Cefepime 12/5>>  HISTORY OF PRESENT ILLNESS:   Stephen [name] is a 52 y.o. Male with a PMH significant for severe nonischemic cardiomyopathy s/p AICD, Chronic Systolic CHF, TIA, and CKD III who presents to [facility] ED on 12/03/18 with c/o shortness of breath.  He reports that the shortness of breath was sudden in onset on 12/4.  He denies fever, chills, edema, weight gain, orthopnea, or paroxsymal nocturnal dyspnea.  However, he does report cough productive of thick white sputum.  Upon EMS arrival, there was concern for STEMI, of which a code STEMI was called.  Cardiology evaluated him in the ED and he did not meet criteria for STEMI, as his EKG was concerning for old Bundle Branch block with no ischemic changes, therefore code was cancelled.  Upon arrival to the ED pt was noted to be in acute respiratory distress, therefore he was placed on BiPAP.  Initial workup in the ED revealed BNP 2411, Troponin 0.05, Potassium 3.3, Creatinine 2.12, WBC 12.8, and anion gap 16.  CXR was concerning for severe diffuse bilateral airspace disease due to asymmetric  edema vs. Infection.  Therefore follow up CT scan was obtained which revealed pneumonia and pulmonary edema.  He received IV diuretics, and then was placed on Rocephin and Azithromycin.  Upon arrival to ICU he was switched to Vancomycin and Cefepime.  He is admitted to [facility] ICU for treatment of Acute Hypoxic Respiratory Failure in setting of Acute decompensated Heart Failure and CAP, sepsis, elevated troponin, and AKI on  CKD III. PCCM is consulted for further management.  PAST MEDICAL HISTORY :   has a past medical history of AICD (automatic cardioverter/defibrillator) present, AKI (acute kidney injury) ([place]) (12/24/2017), Arrhythmia, Arthritis, Cardiac arrest ([place]) (12/23/2017), Chest pain (09/08/2017), CHF (congestive heart failure) ([place]), Gout, Headache, High cholesterol, History of kidney stones, Hypertension, Influenza A (12/24/2017), NICM (nonischemic cardiomyopathy) ([place]), Pneumonia (12/20/2017), and TIA (transient ischemic attack) (06/21/2016).  has a past surgical history that includes Vasectomy; LEFT HEART CATH AND CORONARY ANGIOGRAPHY (N/A, 09/09/2017); ICD IMPLANT (N/A, 12/30/2017); V TACH ABLATION (06/10/2018); External fixation leg (Right, ~ 2000); Umbilical hernia repair (1990s); Hernia repair; and V TACH ABLATION (N/A, 06/10/2018). Prior to Admission medications   Medication Sig Start Date End Date Taking? Authorizing Provider  acetaminophen (TYLENOL) 325 MG tablet Take 2 tablets (650 mg total) by mouth every 6 (six) hours as needed. Patient taking differently: Take 325-650 mg by mouth every 6 (six) hours as needed for moderate pain.  03/19/18  Yes Carrie Mew, MD  albuterol (PROVENTIL HFA;VENTOLIN HFA) 108 (90 Base) MCG/ACT inhaler Inhale 2 puffs into the lungs every 4 (four) hours as needed for wheezing or shortness of breath. 09/19/18  Yes Johnson, Norge, DO  allopurinol (ZYLOPRIM) 100 MG tablet Take 2 tablets (200 mg total) by mouth daily. 10/19/18  Yes Johnson, Megan P, DO    amiodarone (PACERONE) 200 MG tablet Take 300 mg by mouth daily.   Yes [provider]  aspirin EC 81 MG EC tablet Take 1 tablet (81 mg total) by mouth daily. 01/01/18  Yes Daune Perch, NP  atorvastatin (LIPITOR) 40 MG tablet Take 80 mg by mouth daily at 6 PM.  10/29/18  Yes [provider]  carvedilol (COREG) 3.125 MG tablet Take 1 tablet (3.125 mg total) by mouth 2 (two) times daily with a meal. 07/06/18  Yes Wieting, Richard, MD  clopidogrel (PLAVIX) 75 MG tablet Take 1 tablet (75 mg total) by mouth daily. 01/30/18  Yes Bensimhon, Shaune Pascal, MD  colchicine 0.6 MG tablet Take 1 tablet (0.6 mg total) by mouth daily as needed (gout flare). 09/03/18  Yes Johnson, Megan P, DO  cyclobenzaprine (FLEXERIL) 5 MG tablet Take 1 tablet (5 mg total) by mouth 3 (three) times daily as needed (jaw pain). 11/24/18  Yes Nance Pear, MD  diclofenac sodium (VOLTAREN) 1 % GEL Apply 2 g topically 4 (four) times daily as needed (pain).    Yes [provider]  fluticasone (FLONASE) 50 MCG/ACT nasal spray Place 2 sprays into both nostrils daily. Patient taking differently: Place 2 sprays into both nostrils daily as needed for allergies.  06/20/17  Yes [name], MD  fluticasone-salmeterol (ADVAIR HFA) [phone] MCG/ACT inhaler Inhale 2 puffs into the lungs 2 (two) times daily. 08/12/18  Yes Kasa, Maretta Bees, MD  Menthol, Topical Analgesic, (ICY HOT EX) Apply 1 application topically daily as needed (pain).   Yes [provider]  metolazone (ZAROXOLYN) 2.5 MG tablet Take 1 tablet (2.5 mg total) by mouth once daily TAKE 30 MINUTES PRIOR TO LASIX 09/05/18  Yes [provider]  potassium chloride SA (K-DUR,KLOR-CON) 20 MEQ tablet Take 1 tablet (20 mEq total) by mouth 2 (two) times daily. Patient taking differently: Take 20 mEq by mouth 4 (four) times daily.  07/06/18  Yes Wieting, Richard, MD  promethazine (PHENERGAN) 12.5 MG tablet Take 1 tablet (12.5 mg total) by mouth every 6 (six)  hours as needed for nausea or vomiting. 11/24/18  Yes Nance Pear, MD  sacubitril-valsartan (ENTRESTO) 49-51 MG Take 0.5 tablets by mouth 2 (two) times daily.    Yes [provider]  sildenafil (REVATIO) 20 MG tablet Take by mouth as needed.  10/29/18 10/29/19 Yes [provider]  Tiotropium Bromide Monohydrate (SPIRIVA RESPIMAT) 2.5 MCG/ACT AERS Inhale 1 puff into the lungs daily. 08/12/18  Yes [name], MD  torsemide (DEMADEX) 20 MG tablet Take 2 tablets (40 mg total) by mouth daily. 07/06/18  Yes Wieting, Richard, MD  Camphor-Eucalyptus-Menthol (CHEST RUB) 4.8-1.2-2.6 % OINT Apply 1 application topically daily as needed (congestion).     [provider]  ciprofloxacin (CIPRO) 500 MG tablet Take 1 tablet (500 mg total) by mouth 2 (two) times daily. Patient not taking: Reported on 12/03/2018 11/13/18   Park Liter P, DO  meloxicam (MOBIC) 15 MG tablet Take 15 mg by mouth daily.    [provider]  Multiple Vitamin (MULTIVITAMIN WITH MINERALS) TABS tablet Take 1 tablet by mouth daily.    [provider]  naproxen (NAPROSYN) 500 MG tablet Take 1 tablet (500 mg total) by mouth 2 (two) times daily with a meal. Patient not taking: Reported on 12/03/2018 11/24/18 11/24/19  Nance Pear, MD  nitroGLYCERIN (NITROSTAT) 0.4 MG  SL tablet Place 1 tablet (0.4 mg total) under the tongue every 5 (five) minutes as needed for chest pain. 02/19/18   Bensimhon, Shaune Pascal, MD  sacubitril-valsartan (ENTRESTO) 24-26 MG Take 1 tablet by mouth 2 (two) times daily. 07/06/18   Loletha Grayer, MD  tamsulosin (FLOMAX) 0.4 MG CAPS capsule Take 1 capsule (0.4 mg total) by mouth daily. Patient not taking: Reported on 12/03/2018 11/13/18   Park Liter P, DO   Allergies  Allergen Reactions  . Lisinopril Cough    FAMILY HISTORY:  family history includes CAD in his father; Heart attack in his father; Heart failure in his mother; Hypertension in his father and  mother. SOCIAL HISTORY:  reports that he quit smoking about 2 years ago. His smoking use included cigarettes. He has a 10.89 pack-year smoking history. He has never used smokeless tobacco. He reports that he drinks alcohol. He reports that he does not use drugs.  REVIEW OF SYSTEMS:  Positives in BOLD Constitutional: Negative for fever, chills, weight loss, malaise/fatigue and diaphoresis.  HENT: Negative for hearing loss, ear pain, nosebleeds, congestion, sore throat, neck pain, tinnitus and ear discharge.   Eyes: Negative for blurred vision, double vision, photophobia, pain, discharge and redness.  Respiratory: Negative for +cough, hemoptysis, +sputum production, +shortness of breath, wheezing and stridor.   Cardiovascular: Negative for chest pain, palpitations, orthopnea, claudication, leg swelling and PND.  Gastrointestinal: Negative for heartburn, nausea, vomiting, abdominal pain, diarrhea, constipation, blood in stool and melena.  Genitourinary: Negative for dysuria, urgency, frequency, hematuria and flank pain.  Musculoskeletal: Negative for myalgias, back pain, joint pain and falls.  Skin: Negative for itching and rash.  Neurological: Negative for dizziness, tingling, tremors, sensory change, speech change, focal weakness, seizures, loss of consciousness, weakness and headaches.  Endo/Heme/Allergies: Negative for environmental allergies and polydipsia. Does not bruise/bleed easily.  SUBJECTIVE:  Pt states he is slightly short of breath, but his work of breathing has greatly improved as compared to earlier He denies chest pain, fever, chills He reports cough productive of white sputum On BiPAP  VITAL SIGNS: Temp:  [98.4 F (36.9 C)] 98.4 F (36.9 C) (12/05 0351) Pulse Rate:  [48-157] 71 (12/05 0351) Resp:  [16-42] 26 (12/05 0351) BP: (89-145)/(77-112) 112/92 (12/05 0351) SpO2:  [59 %-100 %] 96 % (12/05 0334) Weight:  [107 kg] 107 kg (12/05 0351)  PHYSICAL EXAMINATION: General:   Acutely ill appearing male, sitting in bed, on BiPAP, in NAD Neuro:  Awake and alert, oriented x4, Follows commands, no focal deficits, speech clear HEENT:  Atraumatic, normocephalic, neck supple, no JVD Cardiovascular:  RRR, s1s2, no M/R/G, 2+ pulses throughout Lungs:  Coarse rhonchi noted in RUL, RML, RLL.  No wheezing. Even, nonlabored, normal effort, BiPAP assisted Abdomen:  Soft, nontender, nondistended, BS+ x4 Musculoskeletal:  Normal bulk and tone, no deformities, no edema Skin:  Warm/dry.  No obvious rashes, lesions, or ulcerations  Recent Labs  Lab 12/03/18 1857  NA 140  K 3.3*  CL 97*  CO2 27  BUN 37*  CREATININE 2.12*  GLUCOSE 292*   Recent Labs  Lab 12/03/18 1857  HGB 14.6  HCT 46.2  WBC 12.8*  PLT 302   Ct Chest Wo Contrast  Result Date: 12/04/2018 CLINICAL DATA:  Shortness of breath. History of cardiomyopathy, CHF, pneumonia. EXAM: CT CHEST WITHOUT CONTRAST TECHNIQUE: Multidetector CT imaging of the chest was performed following the standard protocol without IV contrast. COMPARISON:  Chest radiograph December 03, 2018 and CT chest September 28, 2017 FINDINGS:  CARDIOVASCULAR: The heart is moderately enlarged. No pericardial effusion. Thoracic aorta is normal course and caliber, mild calcific atherosclerosis. Pacemaker via LEFT subclavian venous approach. MEDIASTINUM/NODES: No mediastinal mass. No lymphadenopathy by CT size criteria, decreased sensitivity without contrast. LUNGS/PLEURA: Tracheobronchial tree is patent, no pneumothorax. RIGHT greater than LEFT patchy ground-glass opacities with superimposed predominately RIGHT consolidation confluent in the lower lobes with air bronchograms. Minimal RIGHT pleural effusion. Minimal apical interlobular septal thickening. UPPER ABDOMEN: Nonacute. MUSCULOSKELETAL: Nonacute. LEFT upper paraspinal lipoma within subcutaneous fat. Mild thoracic spondylosis. IMPRESSION: 1. Ground-glass opacities and patchy consolidation RIGHT greater  than LEFT lungs seen with pneumonia and/or pulmonary edema. Trace RIGHT pleural effusion. 2. Moderate cardiomegaly. Aortic Atherosclerosis (ICD10-I70.0). Electronically Signed   By: Elon Alas M.D.   On: 12/04/2018 04:25   Dg Chest Portable 1 View  Result Date: 12/03/2018 CLINICAL DATA:  Shortness of breath EXAM: PORTABLE CHEST 1 VIEW COMPARISON:  11/24/2018 FINDINGS: Left AICD remains in place, unchanged. Cardiomegaly. Diffuse severe bilateral airspace disease, right greater than left. No visible significant effusions. No acute bony abnormality. IMPRESSION: Severe diffuse bilateral airspace disease, right greater than left. This could reflect asymmetric edema or infection. Electronically Signed   By: Rolm Baptise M.D.   On: 12/03/2018 19:34    ASSESSMENT / PLAN:  Acute Hypoxic Respiratory Failure in setting of Acute Decompensated CHF and CAP Hx: COPD -Supplemental O2 to maintain O2 sats 88 to 94% -BiPAP, wean as tolerated -Follow intermittent CXR and ABG -IV Lasix 60 mg BID -Place on broad spectrum Abx with Vancomycin and Cefepime -Follow cultures -Trend WBC's and Procalcitonin  Acute Decompensated CHF Elevated troponin, likely demand ischemia Hx: HTN, Hyperlipidemia, s/p AICD -Cardiac monitoring -Maintain MAP >65 -Levophed if needed to maintain MAP goal -IV Lasix 60 mg BID -Trend BNP -Cardiology consulted, appreciate input -Obtain Echocardiogram -Trend Troponin  Meets SIRS Criteria Sepsis secondary to CAP -Monitor fever curve -Trend WBC's and Procalcitonin -Trend Lactic acid -Follow cultures as above -Vancomycin and Cefepime -Currently not hypotensive, unable to give IVF given pulmonary edema  AKI on CKD III -Monitor I&O's / urinary output -Follow BMP with Diuresis -Ensure adequate renal perfusion -Avoid nephrotoxic agents as able -Replace electrolytes as indicated   DISPOSITION: ICU GOALS OF CARE: Limited Code, NO intubation VTE PROPHYLAXIS: Heparin  SQ UPDATES: Updated pt at bedside 12/04/18   Darel Hong, [facility] [place] [facility] Pager: [phone] Cell: [phone]  12/04/2018, 4:29 AM

## 2018-12-04 NOTE — Progress Notes (Deleted)
Patient with room assignment.  Patient updated.  Report given to receiving RN, Gerald Stabs with no further questions.

## 2018-12-04 NOTE — ED Notes (Addendum)
Pt with increasing shob and work of breathing, coughing has increased and sats down to 82%. Pt coughing up white frothy sputum, RT notified, Dr. Verlee Monte made aware. She will come reeval pt, bipap in place.

## 2018-12-04 NOTE — Progress Notes (Signed)
RN clarified with Dr Dionne Bucy if he wants to change 60mg  IV lasix to PO lasix, MD states to give other meds now  and wait 2 hours and if systolic is still in 03T, can go ahead and give IV lasix, other wise hold.

## 2018-12-04 NOTE — ED Notes (Signed)
Sats up to 92% on bipap, pt states breathing has improved. Still mod shob noted, RT at bedside.

## 2018-12-04 NOTE — Progress Notes (Signed)
[facility] [place] NAME: Stephen [name]    MR#:  [id number]  DATE OF BIRTH:  09-Sep-1966  SUBJECTIVE:  CHIEF COMPLAINT:   Chief Complaint  Patient presents with  . Shortness of Breath  . Code STEMI  Patient seen and evaluated today Decreased shortness of breath Weaned off BiPAP On oxygen via nasal cannula No complaints of chest pain  REVIEW OF SYSTEMS:    ROS  CONSTITUTIONAL: No documented fever. Has fatigue, weakness. No weight gain, no weight loss.  EYES: No blurry or double vision.  ENT: No tinnitus. No postnasal drip. No redness of the oropharynx.  RESPIRATORY: occasional cough, no wheeze, no hemoptysis. Has dyspnea.  CARDIOVASCULAR: No chest pain. No orthopnea. No palpitations. No syncope.  GASTROINTESTINAL: No nausea, no vomiting or diarrhea. No abdominal pain. No melena or hematochezia.  GENITOURINARY: No dysuria or hematuria.  ENDOCRINE: No polyuria or nocturia. No heat or cold intolerance.  HEMATOLOGY: No anemia. No bruising. No bleeding.  INTEGUMENTARY: No rashes. No lesions.  MUSCULOSKELETAL: No arthritis. No swelling. No gout.  NEUROLOGIC: No numbness, tingling, or ataxia. No seizure-type activity.  PSYCHIATRIC: No anxiety. No insomnia. No ADD.   DRUG ALLERGIES:   Allergies  Allergen Reactions  . Lisinopril Cough    VITALS:  Blood pressure (!) 81/69, pulse (!) 46, temperature 98.6 F (37 C), temperature source Axillary, resp. rate (!) 23, height 6\' 1"  (1.854 m), weight 107 kg, SpO2 97 %.  PHYSICAL EXAMINATION:   Physical Exam  GENERAL:  52 y.o.-year-old patient lying in the bed with no acute distress.  EYES: Pupils equal, round, reactive to light and accommodation. No scleral icterus. Extraocular muscles intact.  HEENT: Head atraumatic, normocephalic. Oropharynx and nasopharynx clear.  NECK:  Supple, no jugular venous distention. No thyroid enlargement, no tenderness.  LUNGS: Improved breath sounds  bilaterally, decreased bibasilar Rales. No use of accessory muscles of respiration.  CARDIOVASCULAR: S1, S2 normal. No murmurs, rubs, or gallops.  ABDOMEN: Soft, nontender, nondistended. Bowel sounds present. No organomegaly or mass.  EXTREMITIES: No cyanosis, clubbing or edema b/l.    NEUROLOGIC: Cranial nerves II through XII are intact. No focal Motor or sensory deficits b/l.   PSYCHIATRIC: The patient is alert and oriented x 3.  SKIN: No obvious rash, lesion, or ulcer.   LABORATORY PANEL:   CBC Recent Labs  Lab 12/04/18 0612  WBC 12.9*  HGB 13.7  HCT 41.8  PLT 272   ------------------------------------------------------------------------------------------------------------------ Chemistries  Recent Labs  Lab 12/03/18 1857 12/04/18 0612  NA 140 140  K 3.3* 3.5  CL 97* 100  CO2 27 30  GLUCOSE 292* 114*  BUN 37* 40*  CREATININE 2.12* 1.88*  CALCIUM 9.2 8.8*  MG 2.3  --   AST 22  --   ALT 18  --   ALKPHOS 99  --   BILITOT 0.6  --    ------------------------------------------------------------------------------------------------------------------  Cardiac Enzymes Recent Labs  Lab 12/04/18 0943  TROPONINI 0.08*   ------------------------------------------------------------------------------------------------------------------  RADIOLOGY:  Ct Chest Wo Contrast  Result Date: 12/04/2018 CLINICAL DATA:  Shortness of breath. History of cardiomyopathy, CHF, pneumonia. EXAM: CT CHEST WITHOUT CONTRAST TECHNIQUE: Multidetector CT imaging of the chest was performed following the standard protocol without IV contrast. COMPARISON:  Chest radiograph December 03, 2018 and CT chest September 28, 2017 FINDINGS: CARDIOVASCULAR: The heart is moderately enlarged. No pericardial effusion. Thoracic aorta is normal course and caliber, mild calcific atherosclerosis. Pacemaker via LEFT subclavian venous approach. MEDIASTINUM/NODES: No mediastinal  mass. No lymphadenopathy by CT size criteria,  decreased sensitivity without contrast. LUNGS/PLEURA: Tracheobronchial tree is patent, no pneumothorax. RIGHT greater than LEFT patchy ground-glass opacities with superimposed predominately RIGHT consolidation confluent in the lower lobes with air bronchograms. Minimal RIGHT pleural effusion. Minimal apical interlobular septal thickening. UPPER ABDOMEN: Nonacute. MUSCULOSKELETAL: Nonacute. LEFT upper paraspinal lipoma within subcutaneous fat. Mild thoracic spondylosis. IMPRESSION: 1. Ground-glass opacities and patchy consolidation RIGHT greater than LEFT lungs seen with pneumonia and/or pulmonary edema. Trace RIGHT pleural effusion. 2. Moderate cardiomegaly. Aortic Atherosclerosis (ICD10-I70.0). Electronically Signed   By: Elon Alas M.D.   On: 12/04/2018 04:25   Dg Chest Portable 1 View  Result Date: 12/03/2018 CLINICAL DATA:  Shortness of breath EXAM: PORTABLE CHEST 1 VIEW COMPARISON:  11/24/2018 FINDINGS: Left AICD remains in place, unchanged. Cardiomegaly. Diffuse severe bilateral airspace disease, right greater than left. No visible significant effusions. No acute bony abnormality. IMPRESSION: Severe diffuse bilateral airspace disease, right greater than left. This could reflect asymmetric edema or infection. Electronically Signed   By: Rolm Baptise M.D.   On: 12/03/2018 19:34     ASSESSMENT AND PLAN:   52 year old male patient with history of dilated cardiomyopathy, chronic left bundle branch block, chronic kidney disease stage III, hyperlipidemia, ICD currently in ICU for respiratory distress  -Acute hypoxic respiratory failure Weaned of BiPAP Continue oxygen via nasal cannula IV Lasix for diuresis Antibiotic therapy Intensivist follow-up  -Acute on chronic systolic heart failure exacerbation Continue diuresis with Lasix Continue Coreg, Entresto and Aldactone Intake and output charts along with daily body weights Has history of severe cardiomyopathy with poor ejection fraction of  15% Status post cardiology evaluation  -Elevated troponin probably secondary to demand ischemia from underlying heart failure  -History of gout attack Continue colchicine and allopurinol  -CKD stage III Monitor renal function  -COPD Continue inhaler treatments  -Hyperlipidemia Continue statin medication  -Healthcare associated pneumonia On IV cefepime antibiotic Follow-up cultures MRSA PCR is negative  All the records are reviewed and case discussed with Care Management/Social Worker. Management plans discussed with the patient, family and they are in agreement.  CODE STATUS: Full code  DVT Prophylaxis: SCDs  TOTAL TIME TAKING CARE OF THIS PATIENT: 34 minutes.   POSSIBLE D/C IN 2 to 3 DAYS, DEPENDING ON CLINICAL CONDITION.  Saundra Shelling M.D on 12/04/2018 at 11:18 AM  Between 7am to 6pm - Pager - [phone]  After 6pm go to [email or web address] - password [email or web address] [facility]  Office  [phone]  CC: Primary care physician; Valerie Roys, DO  Note: This dictation was prepared with Dragon dictation along with smaller phrase technology. Any transcriptional errors that result from this process are unintentional.

## 2018-12-04 NOTE — Progress Notes (Addendum)
[place] Progress Note Patient Name: Stephen [name] DOB: 1966-02-23 MRN: [id number]   Date of Service  12/04/2018  HPI/Events of Note  Pt admitted with acute respiratory failure, CHF, pneumonia and NSTEMI. He has severe cardiomyopathy with systolic heart failure and EF of 15 %  eICU Interventions  Pt history and physical , as well as the management plan outlined by hospitalist, reveiwed. I agree with the management plan outlined.        Kerry Kass Rhet Rorke 12/04/2018, 3:50 AM

## 2018-12-04 NOTE — ED Notes (Signed)
Pt admitted to [facility] RT at bedside, CT done on the way to the unit.

## 2018-12-04 NOTE — ED Notes (Signed)
Sats down to 88% with o2 at 4l, increased to 6l per [place].

## 2018-12-04 NOTE — Progress Notes (Signed)
Transported pt to CT then CCU on Bipap without incident.

## 2018-12-04 NOTE — Progress Notes (Signed)
CXR w/ asymmetrical findings. WBC > 12, SIRS (+). CT chest (+) R-sided pneumonia. (+) sepsis. -Ceftriaxone -Azithromycin -Sputum Cx/gram -BCx x2 -UStrep Ag -ULegionella Ag -PCT -Lactate -Called & updated ICU attending on call  -P. Camron Essman (Nocturnist/hospitalist)

## 2018-12-04 NOTE — Progress Notes (Signed)
Pharmacy Antibiotic Note  Stephen [name] is a 52 y.o. male admitted on 12/03/2018 with pneumonia.  Pharmacy has been consulted for vanc/cefepime dosing.  Plan: Will start vanc 1.25g IV q12h   Will draw a trough 12/06 @ 1600 prior to 4th dose. Will start cefepime 2g IV q12h  Ke 0.0473 T1/2 ~ 12 hrs Goal trough 15 - 20 mcg/mL  Height: 6\' 1"  (185.4 cm) Weight: 235 lb 14.3 oz (107 kg) IBW/kg (Calculated) : 79.9  Temp (24hrs), Avg:98.4 F (36.9 C), Min:98.4 F (36.9 C), Max:98.4 F (36.9 C)  Recent Labs  Lab 12/03/18 1857  WBC 12.8*  CREATININE 2.12*    Estimated Creatinine Clearance: 51.7 mL/min (A) (by C-G formula based on SCr of 2.12 mg/dL (H)).    Allergies  Allergen Reactions  . Lisinopril Cough    Thank you for allowing pharmacy to be a part of this patient's care.  Tobie Lords, PharmD, BCPS Clinical Pharmacist 12/04/2018

## 2018-12-04 NOTE — Progress Notes (Addendum)
Had discussion with pt regarding CODE status in the event that he were to decompensate.  Pt is alert and oriented x4.  Pt states he wishes to be a limited code, he would want everything but not to be intubated.  He reinforces that BiPAP is the most aggressive form of Respiratory support he wants, and doesn't want to be intubated.  He does however which to have CPR, defibrillation, and ACLS medications in the event he were to Cardiac arrest.  Orders placed for limited code, NO intubation.      Darel Hong, AGACNP-BC [facility] Pager: [phone] Cell: [phone]

## 2018-12-05 ENCOUNTER — Telehealth: Payer: Self-pay | Admitting: Internal Medicine

## 2018-12-05 ENCOUNTER — Inpatient Hospital Stay: Payer: Commercial Managed Care - PPO

## 2018-12-05 DIAGNOSIS — E0590 Thyrotoxicosis, unspecified without thyrotoxic crisis or storm: Secondary | ICD-10-CM

## 2018-12-05 LAB — CBC
HCT: 40.4 % (ref 39.0–52.0)
Hemoglobin: 12.8 g/dL — ABNORMAL LOW (ref 13.0–17.0)
MCH: 28.1 pg (ref 26.0–34.0)
MCHC: 31.7 g/dL (ref 30.0–36.0)
MCV: 88.8 fL (ref 80.0–100.0)
PLATELETS: 244 10*3/uL (ref 150–400)
RBC: 4.55 MIL/uL (ref 4.22–5.81)
RDW: 16.4 % — ABNORMAL HIGH (ref 11.5–15.5)
WBC: 11.5 10*3/uL — ABNORMAL HIGH (ref 4.0–10.5)
nRBC: 0 % (ref 0.0–0.2)

## 2018-12-05 LAB — BRAIN NATRIURETIC PEPTIDE: B Natriuretic Peptide: 755 pg/mL — ABNORMAL HIGH (ref 0.0–100.0)

## 2018-12-05 LAB — BASIC METABOLIC PANEL
Anion gap: 8 (ref 5–15)
BUN: 44 mg/dL — ABNORMAL HIGH (ref 6–20)
CALCIUM: 8.8 mg/dL — AB (ref 8.9–10.3)
CO2: 31 mmol/L (ref 22–32)
Chloride: 100 mmol/L (ref 98–111)
Creatinine, Ser: 1.8 mg/dL — ABNORMAL HIGH (ref 0.61–1.24)
GFR calc Af Amer: 49 mL/min — ABNORMAL LOW (ref >60)
GFR, EST NON AFRICAN AMERICAN: 42 mL/min — AB (ref >60)
Glucose, Bld: 105 mg/dL — ABNORMAL HIGH (ref 70–99)
Potassium: 3 mmol/L — ABNORMAL LOW (ref 3.5–5.1)
Sodium: 139 mmol/L (ref 135–145)

## 2018-12-05 LAB — PROCALCITONIN: Procalcitonin: 0.78 ng/mL

## 2018-12-05 LAB — POTASSIUM: Potassium: 3.3 mmol/L — ABNORMAL LOW (ref 3.5–5.1)

## 2018-12-05 LAB — LEGIONELLA PNEUMOPHILA SEROGP 1 UR AG: L. pneumophila Serogp 1 Ur Ag: NEGATIVE

## 2018-12-05 LAB — HIV ANTIBODY (ROUTINE TESTING W REFLEX): HIV Screen 4th Generation wRfx: NONREACTIVE

## 2018-12-05 MED ORDER — DIPHENHYDRAMINE HCL 25 MG PO CAPS: 25.0000 mg | ORAL_CAPSULE | Freq: Every evening | ORAL | Status: DC | PRN

## 2018-12-05 MED ORDER — BUTALBITAL-APAP-CAFFEINE 50-325-40 MG PO TABS
1.0000 | ORAL_TABLET | Freq: Four times a day (QID) | ORAL | Status: DC | PRN
  Administered 2018-12-05: 1 via ORAL
  Filled 2018-12-05: qty 1

## 2018-12-05 MED ORDER — AMIODARONE HCL 200 MG PO TABS
300.0000 mg | ORAL_TABLET | Freq: Every day | ORAL | Status: DC
  Administered 2018-12-05: 300 mg via ORAL
  Filled 2018-12-05: qty 2

## 2018-12-05 MED ORDER — POTASSIUM CHLORIDE CRYS ER 20 MEQ PO TBCR
40.0000 meq | EXTENDED_RELEASE_TABLET | Freq: Once | ORAL | Status: AC
  Administered 2018-12-05: 40 meq via ORAL
  Filled 2018-12-05: qty 2

## 2018-12-05 NOTE — Progress Notes (Signed)
[facility] [place] NAME: Stephen [name]    MR#:  [id number]  DATE OF BIRTH:  02-28-1966  SUBJECTIVE:  CHIEF COMPLAINT:   Chief Complaint  Patient presents with  . Shortness of Breath  . Code STEMI  Patient seen and evaluated today Decreased shortness of breath Patient has been weaned off oxygen No complaints of chest pain  REVIEW OF SYSTEMS:    ROS  CONSTITUTIONAL: No documented fever. Has decreased fatigue, weakness. No weight gain, no weight loss.  EYES: No blurry or double vision.  ENT: No tinnitus. No postnasal drip. No redness of the oropharynx.  RESPIRATORY: occasional cough, no wheeze, no hemoptysis. Has no dyspnea.  CARDIOVASCULAR: No chest pain. No orthopnea. No palpitations. No syncope.  GASTROINTESTINAL: No nausea, no vomiting or diarrhea. No abdominal pain. No melena or hematochezia.  GENITOURINARY: No dysuria or hematuria.  ENDOCRINE: No polyuria or nocturia. No heat or cold intolerance.  HEMATOLOGY: No anemia. No bruising. No bleeding.  INTEGUMENTARY: No rashes. No lesions.  MUSCULOSKELETAL: No arthritis. No swelling. No gout.  NEUROLOGIC: No numbness, tingling, or ataxia. No seizure-type activity.  PSYCHIATRIC: No anxiety. No insomnia. No ADD.   DRUG ALLERGIES:   Allergies  Allergen Reactions  . Lisinopril Cough    VITALS:  Blood pressure (!) 88/62, pulse 73, temperature 98.3 F (36.8 C), temperature source Oral, resp. rate (!) 28, height 6\' 1"  (1.854 m), weight 107 kg, SpO2 93 %.  PHYSICAL EXAMINATION:   Physical Exam  GENERAL:  52 y.o.-year-old patient lying in the bed with no acute distress.  EYES: Pupils equal, round, reactive to light and accommodation. No scleral icterus. Extraocular muscles intact.  HEENT: Head atraumatic, normocephalic. Oropharynx and nasopharynx clear.  NECK:  Supple, no jugular venous distention. No thyroid enlargement, no tenderness.  LUNGS: Improved breath sounds bilaterally,  decreased bibasilar rales. No use of accessory muscles of respiration.  CARDIOVASCULAR: S1, S2 normal. No murmurs, rubs, or gallops.  ABDOMEN: Soft, nontender, nondistended. Bowel sounds present. No organomegaly or mass.  EXTREMITIES: No cyanosis, clubbing or edema b/l.    NEUROLOGIC: Cranial nerves II through XII are intact. No focal Motor or sensory deficits b/l.   PSYCHIATRIC: The patient is alert and oriented x 3.  SKIN: No obvious rash, lesion, or ulcer.   LABORATORY PANEL:   CBC Recent Labs  Lab 12/05/18 0334  WBC 11.5*  HGB 12.8*  HCT 40.4  PLT 244   ------------------------------------------------------------------------------------------------------------------ Chemistries  Recent Labs  Lab 12/03/18 1857  12/05/18 0334 12/05/18 0858  NA 140   < > 139  --   K 3.3*   < > 3.0* 3.3*  CL 97*   < > 100  --   CO2 27   < > 31  --   GLUCOSE 292*   < > 105*  --   BUN 37*   < > 44*  --   CREATININE 2.12*   < > 1.80*  --   CALCIUM 9.2   < > 8.8*  --   MG 2.3  --   --   --   AST 22  --   --   --   ALT 18  --   --   --   ALKPHOS 99  --   --   --   BILITOT 0.6  --   --   --    < > = values in this interval not displayed.   ------------------------------------------------------------------------------------------------------------------  Cardiac Enzymes  Recent Labs  Lab 12/04/18 0943  TROPONINI 0.08*   ------------------------------------------------------------------------------------------------------------------  RADIOLOGY:  Ct Chest Wo Contrast  Result Date: 12/04/2018 CLINICAL DATA:  Shortness of breath. History of cardiomyopathy, CHF, pneumonia. EXAM: CT CHEST WITHOUT CONTRAST TECHNIQUE: Multidetector CT imaging of the chest was performed following the standard protocol without IV contrast. COMPARISON:  Chest radiograph December 03, 2018 and CT chest September 28, 2017 FINDINGS: CARDIOVASCULAR: The heart is moderately enlarged. No pericardial effusion. Thoracic  aorta is normal course and caliber, mild calcific atherosclerosis. Pacemaker via LEFT subclavian venous approach. MEDIASTINUM/NODES: No mediastinal mass. No lymphadenopathy by CT size criteria, decreased sensitivity without contrast. LUNGS/PLEURA: Tracheobronchial tree is patent, no pneumothorax. RIGHT greater than LEFT patchy ground-glass opacities with superimposed predominately RIGHT consolidation confluent in the lower lobes with air bronchograms. Minimal RIGHT pleural effusion. Minimal apical interlobular septal thickening. UPPER ABDOMEN: Nonacute. MUSCULOSKELETAL: Nonacute. LEFT upper paraspinal lipoma within subcutaneous fat. Mild thoracic spondylosis. IMPRESSION: 1. Ground-glass opacities and patchy consolidation RIGHT greater than LEFT lungs seen with pneumonia and/or pulmonary edema. Trace RIGHT pleural effusion. 2. Moderate cardiomegaly. Aortic Atherosclerosis (ICD10-I70.0). Electronically Signed   By: Elon Alas M.D.   On: 12/04/2018 04:25   Dg Chest Port 1 View  Result Date: 12/05/2018 CLINICAL DATA:  Acute respiratory failure EXAM: PORTABLE CHEST 1 VIEW COMPARISON:  12/04/2018 FINDINGS: Cardiac shadow remains enlarged. Pacing device is again seen. Previously seen bilateral airspace disease has improved in the interval from the prior exam particularly in the right lung base consistent with resolving edema. Some persistent changes are noted however. No sizable effusion is seen. IMPRESSION: Persistent but significantly improved edema. Electronically Signed   By: Inez Catalina M.D.   On: 12/05/2018 06:38   Dg Chest Portable 1 View  Result Date: 12/03/2018 CLINICAL DATA:  Shortness of breath EXAM: PORTABLE CHEST 1 VIEW COMPARISON:  11/24/2018 FINDINGS: Left AICD remains in place, unchanged. Cardiomegaly. Diffuse severe bilateral airspace disease, right greater than left. No visible significant effusions. No acute bony abnormality. IMPRESSION: Severe diffuse bilateral airspace disease, right  greater than left. This could reflect asymmetric edema or infection. Electronically Signed   By: Rolm Baptise M.D.   On: 12/03/2018 19:34     ASSESSMENT AND PLAN:   52 year old male patient with history of dilated cardiomyopathy, chronic left bundle branch block, chronic kidney disease stage III, hyperlipidemia, ICD currently in ICU for respiratory distress  -Status post hypoxic respiratory failure Weaned of BiPAP Weaned off oxygen via nasal cannula IV Lasix for diuresis Antibiotic therapy Intensivist follow-up  -Acute on chronic systolic heart failure exacerbation Continue diuresis with Lasix Continue Coreg, Entresto and Aldactone and statin medication Intake and output charts along with daily body weights Has history of severe cardiomyopathy with poor ejection fraction of 15% Status post cardiology evaluation Amiodarone deferred in view of hypersensitivity  -Elevated troponin probably secondary to demand ischemia from underlying heart failure  -History of gout attack Continue colchicine and allopurinol  -CKD stage III Monitor renal function  -COPD Continue inhaler treatments  -Hyperlipidemia Continue statin medication  -Healthcare associated pneumonia On IV cefepime antibiotic Follow-up cultures MRSA PCR is negative  -Transfer to medical floor once bed is available All the records are reviewed and case discussed with Care Management/Social Worker. Management plans discussed with the patient, family and they are in agreement.  CODE STATUS: Full code  DVT Prophylaxis: SCDs  TOTAL TIME TAKING CARE OF THIS PATIENT: 33 minutes.   POSSIBLE D/C IN 2 to 3 DAYS, DEPENDING ON CLINICAL CONDITION.  Saundra Shelling M.D on 12/05/2018 at 11:17 AM  Between 7am to 6pm - Pager - [phone]  After 6pm go to [email or web address] - password [email or web address] [facility]  Office  [phone]  CC: Primary care physician; Valerie Roys, DO  Note: This dictation was  prepared with Dragon dictation along with smaller phrase technology. Any transcriptional errors that result from this process are unintentional.

## 2018-12-05 NOTE — Care Management Note (Signed)
Case Management Note  Patient Details  Name: Stephen [name] MRN: [id number] Date of Birth: 1966/01/30  Subjective/Objective:       Patient is from home alone.  He is independent with all ADL's.  He had a history of CHF, AICD in place.  Open to the heart failure clinic and cardiac rehab.   Transferred from The ICU.  Weaned off BiPAP and currently on room air.   Receiving scheduled IV Lasix, IV abx for PNA.  He is current with his PCP.  Obtains prescriptions at [facility].  Denies difficulties obtaining medications or with medical care.  He does not have a functioning scale at this time but states he can easily get one.  No further needs identified at this time.          Action/Plan:   Expected Discharge Date:                  Expected Discharge Plan:  Home/Self Care  In-House Referral:     Discharge planning Services  CM Consult  Post Acute Care Choice:    Choice offered to:     DME Arranged:    DME Agency:     HH Arranged:    HH Agency:     Status of Service:  In process, will continue to follow  If discussed at Long Length of Stay Meetings, dates discussed:    Additional Comments:  Elza Rafter, RN 12/05/2018, 3:24 PM

## 2018-12-05 NOTE — Progress Notes (Addendum)
Talked to Dr. Margaretmary Eddy about patient's BP of 81/67 patient's BP has been soft all throughout the day, patient asymptomatic. He has scheduled entresto, order to hold. Also asked if patient can have sleeping medication, Benadryl p.o PRN order given. RN will continue to monitor.

## 2018-12-05 NOTE — Progress Notes (Signed)
[facility] Encounter Note  Patient: LANGLEY INGALLS / Admit Date: 12/03/2018 / Date of Encounter: 12/05/2018, 7:53 AM   Subjective: Patient feeling much better.  Still mild amount of cough and congestion and shortness of breath.  No evidence of myocardial infarction despite minimal elevation of troponin consistent with demand ischemia.  No evidence of rhythm disturbances with telemetry showing normal sinus rhythm with bundle branch block Concerns of amiodarone possibly contributing to inflammatory changes of upper lungs now discontinued  Review of Systems: Positive for: Redness of breath cough congestion Negative for: Vision change, hearing change, syncope, dizziness, nausea, vomiting,diarrhea, bloody stool, stomach pain, positive for cough, congestion, negative for diaphoresis, urinary frequency, urinary pain,skin lesions, skin rashes Others previously listed  Objective: Telemetry: Normal sinus rhythm with bundle branch block Physical Exam: Blood pressure 97/83, pulse 68, temperature 98.2 F (36.8 C), temperature source Oral, resp. rate 20, height 6\' 1"  (1.854 m), weight 107 kg, SpO2 97 %. Body mass index is 31.12 kg/m. General: Well developed, well nourished, in no acute distress. Head: Normocephalic, atraumatic, sclera non-icteric, no xanthomas, nares are without discharge. Neck: No apparent masses Lungs: Normal respirations with few wheezes, some rhonchi, no rales , upper chest crackles   Heart: Regular rate and rhythm, normal S1 S2, 2+ apical murmur, no rub, no gallop, PMI is normal size and placement, carotid upstroke normal without bruit, jugular venous pressure normal Abdomen: Soft, non-tender, non-distended with normoactive bowel sounds. No hepatosplenomegaly. Abdominal aorta is normal size without bruit Extremities: No edema, no clubbing, no cyanosis, no ulcers,  Peripheral: 2+ radial, 2+ femoral, 2+ dorsal pedal pulses Neuro: Alert and oriented. Moves  all extremities spontaneously. Psych:  Responds to questions appropriately with a normal affect.   Intake/Output Summary (Last 24 hours) at 12/05/2018 0753 Last data filed at 12/05/2018 0700 Gross per 24 hour  Intake 203.57 ml  Output 2800 ml  Net -2596.43 ml    Inpatient Medications:  . allopurinol  200 mg Oral Daily  . amiodarone  300 mg Oral Daily  . aspirin EC  81 mg Oral Daily  . atorvastatin  80 mg Oral q1800  . carvedilol  3.125 mg Oral BID WC  . chlorhexidine  15 mL Mouth Rinse BID  . clopidogrel  75 mg Oral Daily  . furosemide  60 mg Intravenous BID  . heparin  5,000 Units Subcutaneous Q8H  . mouth rinse  15 mL Mouth Rinse q12n4p  . meloxicam  15 mg Oral Daily  . mometasone-formoterol  2 puff Inhalation BID  . multivitamin with minerals  1 tablet Oral Daily  . potassium chloride SA  20 mEq Oral BID  . sacubitril-valsartan  1 tablet Oral BID  . tiotropium  18 mcg Inhalation Daily   Infusions:  . ceFEPime (MAXIPIME) IV Stopped (12/05/18 0640)    Labs: Recent Labs    12/03/18 1857 12/04/18 0612 12/05/18 0334  NA 140 140 139  K 3.3* 3.5 3.0*  CL 97* 100 100  CO2 27 30 31   GLUCOSE 292* 114* 105*  BUN 37* 40* 44*  CREATININE 2.12* 1.88* 1.80*  CALCIUM 9.2 8.8* 8.8*  MG 2.3  --   --    Recent Labs    12/03/18 1857  AST 22  ALT 18  ALKPHOS 99  BILITOT 0.6  PROT 8.2*  ALBUMIN 3.8   Recent Labs    12/03/18 1857 12/04/18 0612 12/05/18 0334  WBC 12.8* 12.9* 11.5*  NEUTROABS 5.3  --   --  HGB 14.6 13.7 12.8*  HCT 46.2 41.8 40.4  MCV 91.1 87.1 88.8  PLT 302 272 244   Recent Labs    12/03/18 1857 12/04/18 0141 12/04/18 0612 12/04/18 0943  TROPONINI 0.05* 0.08* 0.09* 0.08*   Invalid input(s): POCBNP No results for input(s): HGBA1C in the last 72 hours.   Weights: Filed Weights   12/03/18 1853 12/04/18 0351  Weight: 107 kg 107 kg     Radiology/Studies:  Dg Chest 2 View  Result Date: 11/24/2018 CLINICAL DATA:  Productive cough and  shortness of Breath EXAM: CHEST - 2 VIEW COMPARISON:  08/19/2018 FINDINGS: Cardiac shadow remains enlarged. Defibrillator is again seen. Mild central vascular congestion is noted without significant interstitial edema. The lungs are well aerated bilaterally. No focal infiltrate or sizable effusion is seen. No acute bony abnormality is noted. IMPRESSION: Mild central vascular congestion without significant edema. Stable cardiomegaly. Electronically Signed   By: Inez Catalina M.D.   On: 11/24/2018 14:21   Ct Chest Wo Contrast  Result Date: 12/04/2018 CLINICAL DATA:  Shortness of breath. History of cardiomyopathy, CHF, pneumonia. EXAM: CT CHEST WITHOUT CONTRAST TECHNIQUE: Multidetector CT imaging of the chest was performed following the standard protocol without IV contrast. COMPARISON:  Chest radiograph December 03, 2018 and CT chest September 28, 2017 FINDINGS: CARDIOVASCULAR: The heart is moderately enlarged. No pericardial effusion. Thoracic aorta is normal course and caliber, mild calcific atherosclerosis. Pacemaker via LEFT subclavian venous approach. MEDIASTINUM/NODES: No mediastinal mass. No lymphadenopathy by CT size criteria, decreased sensitivity without contrast. LUNGS/PLEURA: Tracheobronchial tree is patent, no pneumothorax. RIGHT greater than LEFT patchy ground-glass opacities with superimposed predominately RIGHT consolidation confluent in the lower lobes with air bronchograms. Minimal RIGHT pleural effusion. Minimal apical interlobular septal thickening. UPPER ABDOMEN: Nonacute. MUSCULOSKELETAL: Nonacute. LEFT upper paraspinal lipoma within subcutaneous fat. Mild thoracic spondylosis. IMPRESSION: 1. Ground-glass opacities and patchy consolidation RIGHT greater than LEFT lungs seen with pneumonia and/or pulmonary edema. Trace RIGHT pleural effusion. 2. Moderate cardiomegaly. Aortic Atherosclerosis (ICD10-I70.0). Electronically Signed   By: Elon Alas M.D.   On: 12/04/2018 04:25   Dg Chest Port  1 View  Result Date: 12/05/2018 CLINICAL DATA:  Acute respiratory failure EXAM: PORTABLE CHEST 1 VIEW COMPARISON:  12/04/2018 FINDINGS: Cardiac shadow remains enlarged. Pacing device is again seen. Previously seen bilateral airspace disease has improved in the interval from the prior exam particularly in the right lung base consistent with resolving edema. Some persistent changes are noted however. No sizable effusion is seen. IMPRESSION: Persistent but significantly improved edema. Electronically Signed   By: Inez Catalina M.D.   On: 12/05/2018 06:38   Dg Chest Portable 1 View  Result Date: 12/03/2018 CLINICAL DATA:  Shortness of breath EXAM: PORTABLE CHEST 1 VIEW COMPARISON:  11/24/2018 FINDINGS: Left AICD remains in place, unchanged. Cardiomegaly. Diffuse severe bilateral airspace disease, right greater than left. No visible significant effusions. No acute bony abnormality. IMPRESSION: Severe diffuse bilateral airspace disease, right greater than left. This could reflect asymmetric edema or infection. Electronically Signed   By: Rolm Baptise M.D.   On: 12/03/2018 19:34     Assessment and Recommendation  52 y.o. male with known dilated cardiomyopathy with ejection fraction of 25% essential hypertension mixed hyperlipidemia on previous appropriate medication management with pneumonia and possible hypersensitivity to amiodarone with hypoxia and acute on chronic systolic dysfunction heart failure without evidence of myocardial infarction 1.  Continue supportive care for pneumonia infection and inflammation from the pulmonary standpoint 2.  Continue Lasix but change to oral  Lasix at this time for congestive heart failure and pulmonary edema 3.  High intensity cholesterol therapy 4.  Continue beta-blocker Entresto for cardiomyopathy without change 5.  Patient should have further discussion of dietary sodium intake which could contribute to above 6.  No use of amiodarone due to concerns of  hypersensitivity and possibly contributing to pulmonary respiratory failure 7.  Begin ambulation and follow for improvements of symptoms and discharge when symptoms improved from the pulmonary standpoint 8.  No further cardiac diagnostics necessary at this time 9.  Call if further questions  Signed, Serafina Royals M.D. FACC

## 2018-12-05 NOTE — Telephone Encounter (Signed)
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 Not knowing whether it is type I or type II, we will take a joint approach with prednisone 40 mg daily and methimazole 40 mg daily. We will reassess his fT4 and TSH in 1 month's time and see him again in 6 weeks time ------  Notes recorded by Emily Filbert, RN on 11/24/2018 at 4:49 PM EST Preliminary results reviewed. Forwarded to MD desktop for review and signature.  ------  Notes recorded by Emily Filbert, RN on 11/21/2018 at 3:26 PM EST Called [facility] and requested add on labs for Free T3 & Free T4. ------  Notes recorded by Deboraha Sprang, MD on 11/21/2018 at 12:30 PM EST Please Inform Patient  Labs show low TSH; Please order FT4 and 3  Thank ------

## 2018-12-05 NOTE — Progress Notes (Signed)
Talked to Dr. Estanislado Pandy about patient's Bp of 87/62, patient asymptomatic. Patient has scheduled coreg 3.125 and 60 mg IV lasix, order to hold medications.  RN will continue to monitor.

## 2018-12-05 NOTE — Telephone Encounter (Signed)
I attempted to contact the patient. I left a message of results and Dr. Olin Pia recommendations to treat his abnormal thyroid readings with Prednisone/ Methimazole. I asked that he call back to discuss further and follow up plan thereafter.

## 2018-12-05 NOTE — Progress Notes (Signed)
Report given to Janett Billow, RN on 2A.  Patient has been A&Ox4.  Denies pain with no respiratory distress.

## 2018-12-05 NOTE — Progress Notes (Signed)
RN spoke with Dr. Jefferson Fuel and made MD aware that patient's blood pressures have been trending down since morning lasix and coreg were given, but that patient is alert sitting on side of bed, talking, and asymptomatic with MAP of 68 currently.  Dr. Jefferson Fuel gave order as long as patient is asymptomatic and mean arterial pressure (MAP) is 60 or greater do not transfer back to CCU for low systolic pressure and to continue with transfer to 2A.

## 2018-12-05 NOTE — Progress Notes (Signed)
Follow up - Critical Care Medicine Note  Patient Details:    Stephen [name] is an 52 y.o. male.with a PMH significant for severe nonischemic cardiomyopathy s/p AICD, Chronic Systolic CHF, TIA, and CKD III who presents to [facility] ED on 12/03/18 with c/o shortness of breath. He reports that the shortness of breath was sudden in onset on 12/4. He denies fever, chills, edema, weight gain, orthopnea, or paroxsymal nocturnal dyspnea. However, he does report cough productive of thick white sputum. Upon EMS arrival, there was concern for STEMI, of which a code STEMI was called. Cardiology evaluated him in the ED and he did not meet criteria for STEMI, as his EKG was concerning for old Bundle Branch block with no ischemic changes, therefore code was cancelled. Upon arrival to the ED pt was noted to be in acute respiratory distress, therefore he was placed on BiPAP.   Lines, Airways, Drains:    Anti-infectives:  Anti-infectives (From admission, onward)   Start     Dose/Rate Route Frequency Ordered Stop   12/04/18 0500  vancomycin (VANCOCIN) 1,250 mg in sodium chloride 0.9 % 250 mL IVPB  Status:  Discontinued     1,250 mg 166.7 mL/hr over 90 Minutes Intravenous Every 12 hours 12/04/18 0453 12/04/18 1033   12/04/18 0500  ceFEPIme (MAXIPIME) 2 g in sodium chloride 0.9 % 100 mL IVPB     2 g 200 mL/hr over 30 Minutes Intravenous Every 12 hours 12/04/18 0453     12/04/18 0400  cefTRIAXone (ROCEPHIN) 2 g in sodium chloride 0.9 % 100 mL IVPB  Status:  Discontinued     2 g 200 mL/hr over 30 Minutes Intravenous Every 24 hours 12/04/18 0351 12/04/18 0428   12/04/18 0400  azithromycin (ZITHROMAX) 500 mg in sodium chloride 0.9 % 250 mL IVPB  Status:  Discontinued     500 mg 250 mL/hr over 60 Minutes Intravenous Every 24 hours 12/04/18 0351 12/04/18 0428      Microbiology: Results for orders placed or performed during the hospital encounter of 12/03/18  MRSA PCR Screening     Status: None   Collection  Time: 12/04/18  3:37 AM  Result Value Ref Range Status   MRSA by PCR NEGATIVE NEGATIVE Final    Comment:        The GeneXpert MRSA Assay (FDA approved for NASAL specimens only), is one component of a comprehensive MRSA colonization surveillance program. It is not intended to diagnose MRSA infection nor to guide or monitor treatment for MRSA infections. Performed at [facility], [address] [place], [place] [address]   Culture, sputum-assessment     Status: None   Collection Time: 12/04/18  4:28 AM  Result Value Ref Range Status   Specimen Description SPUTUM  Final   Special Requests NONE  Final   Sputum evaluation   Final    THIS SPECIMEN IS ACCEPTABLE FOR SPUTUM CULTURE Performed at [facility], [address], [place], [place] [address]    Report Status 12/04/2018 FINAL  Final  Culture, respiratory     Status: None (Preliminary result)   Collection Time: 12/04/18  4:28 AM  Result Value Ref Range Status   Specimen Description   Final    SPUTUM Performed at [facility], [address], [place], [place] [address]    Special Requests   Final    NONE Reflexed from [id number] Performed at [facility], [address], [place], [place] [address]    Gram Stain  Final    RARE WBC PRESENT,BOTH PMN AND MONONUCLEAR FEW SQUAMOUS EPITHELIAL CELLS PRESENT ABUNDANT GRAM POSITIVE COCCI FEW GRAM POSITIVE RODS FEW GRAM NEGATIVE RODS Performed at [facility], [address], [place], [place] [address]    Culture PENDING  Incomplete   Report Status PENDING  Incomplete  Culture, blood (routine x 2) Call MD if unable to obtain prior to antibiotics being given     Status: None (Preliminary result)   Collection Time: 12/04/18  6:11 AM  Result Value Ref Range Status   Specimen Description BLOOD RIGHT FA  Final   Special Requests   Final    BOTTLES DRAWN AEROBIC AND ANAEROBIC Blood Culture adequate volume   Culture   Final    NO GROWTH 1  DAY Performed at [facility], [address] [place], [place] [address]    Report Status PENDING  Incomplete  Culture, blood (routine x 2) Call MD if unable to obtain prior to antibiotics being given     Status: None (Preliminary result)   Collection Time: 12/04/18  6:19 AM  Result Value Ref Range Status   Specimen Description BLOOD LEFT FA  Final   Special Requests   Final    BOTTLES DRAWN AEROBIC AND ANAEROBIC Blood Culture results may not be optimal due to an excessive volume of blood received in culture bottles   Culture   Final    NO GROWTH 1 DAY Performed at [facility], [address], [place], [place] [address]    Report Status PENDING  Incomplete  Studies: Dg Chest 2 View  Result Date: 11/24/2018 CLINICAL DATA:  Productive cough and shortness of Breath EXAM: CHEST - 2 VIEW COMPARISON:  08/19/2018 FINDINGS: Cardiac shadow remains enlarged. Defibrillator is again seen. Mild central vascular congestion is noted without significant interstitial edema. The lungs are well aerated bilaterally. No focal infiltrate or sizable effusion is seen. No acute bony abnormality is noted. IMPRESSION: Mild central vascular congestion without significant edema. Stable cardiomegaly. Electronically Signed   By: Inez Catalina M.D.   On: 11/24/2018 14:21   Ct Chest Wo Contrast  Result Date: 12/04/2018 CLINICAL DATA:  Shortness of breath. History of cardiomyopathy, CHF, pneumonia. EXAM: CT CHEST WITHOUT CONTRAST TECHNIQUE: Multidetector CT imaging of the chest was performed following the standard protocol without IV contrast. COMPARISON:  Chest radiograph December 03, 2018 and CT chest September 28, 2017 FINDINGS: CARDIOVASCULAR: The heart is moderately enlarged. No pericardial effusion. Thoracic aorta is normal course and caliber, mild calcific atherosclerosis. Pacemaker via LEFT subclavian venous approach. MEDIASTINUM/NODES: No mediastinal mass. No lymphadenopathy by CT size criteria,  decreased sensitivity without contrast. LUNGS/PLEURA: Tracheobronchial tree is patent, no pneumothorax. RIGHT greater than LEFT patchy ground-glass opacities with superimposed predominately RIGHT consolidation confluent in the lower lobes with air bronchograms. Minimal RIGHT pleural effusion. Minimal apical interlobular septal thickening. UPPER ABDOMEN: Nonacute. MUSCULOSKELETAL: Nonacute. LEFT upper paraspinal lipoma within subcutaneous fat. Mild thoracic spondylosis. IMPRESSION: 1. Ground-glass opacities and patchy consolidation RIGHT greater than LEFT lungs seen with pneumonia and/or pulmonary edema. Trace RIGHT pleural effusion. 2. Moderate cardiomegaly. Aortic Atherosclerosis (ICD10-I70.0). Electronically Signed   By: Elon Alas M.D.   On: 12/04/2018 04:25   Dg Chest Port 1 View  Result Date: 12/05/2018 CLINICAL DATA:  Acute respiratory failure EXAM: PORTABLE CHEST 1 VIEW COMPARISON:  12/04/2018 FINDINGS: Cardiac shadow remains enlarged. Pacing device is again seen. Previously seen bilateral airspace disease has improved in the interval from the prior exam particularly in the  right lung base consistent with resolving edema. Some persistent changes are noted however. No sizable effusion is seen. IMPRESSION: Persistent but significantly improved edema. Electronically Signed   By: Inez Catalina M.D.   On: 12/05/2018 06:38   Dg Chest Portable 1 View  Result Date: 12/03/2018 CLINICAL DATA:  Shortness of breath EXAM: PORTABLE CHEST 1 VIEW COMPARISON:  11/24/2018 FINDINGS: Left AICD remains in place, unchanged. Cardiomegaly. Diffuse severe bilateral airspace disease, right greater than left. No visible significant effusions. No acute bony abnormality. IMPRESSION: Severe diffuse bilateral airspace disease, right greater than left. This could reflect asymmetric edema or infection. Electronically Signed   By: Rolm Baptise M.D.   On: 12/03/2018 19:34    Consults: Treatment Team:  Corey Skains, MD    Subjective:    Overnight Issues: Patient has done well.  Presently on nasal cannula, has been weaned off of BiPAP without any difficulties.  Objective:  Vital signs for last 24 hours: Temp:  [98.1 F (36.7 C)-98.5 F (36.9 C)] 98.3 F (36.8 C) (12/06 0800) Pulse Rate:  [27-118] 76 (12/06 0841) Resp:  [14-27] 21 (12/06 0800) BP: (61-160)/(38-132) 110/85 (12/06 0800) SpO2:  [68 %-100 %] 98 % (12/06 0800)  Hemodynamic parameters for last 24 hours:    Intake/Output from previous day: 12/05 0701 - 12/06 0700 In: 203.6 [IV Piggyback:203.6] Out: 2800 [Urine:2800]  Intake/Output this shift: Total I/O In: -  Out: 275 [Urine:275]  Physical Exam:  Vital signs: Please see the above listed vital signs HEENT: No oral lesions appreciated, trachea midline, jugular venous distention noted Cardiovascular: Regular rate and rhythm Pulmonary: Crackles appreciated right greater than left Abdominal: Positive bowel sounds, soft exam Extremities: No clubbing, cyanosis, trace edema appreciated Neurologic: No focal deficits noted  Assessment/Plan:   Severe nonischemic cardiomyopathy.  Patient has been weaned off BiPAP.  We will continue his cardiac medications per cardiology recommendations.  She diuresed 2.3 L question of whether there was amiodarone toxicity.  Very difficult to state definitively.  Will repeat chest x-ray today.  If there has been some clearing on film would most likely continue amiodarone and consider it volume overload/superimposed pneumonia.  Sed rate checked empirically, mildly elevated at 41  Hypokalemia.  Will replace  Leukocytosis.  Empirically on antibiotic coverage cefepime   Lamberto Dinapoli 12/05/2018  *Care during the described time interval was provided by me and/or other providers on the critical care team.  I have reviewed this patient's available data, including medical history, events of note, physical examination and test results as part of my evaluation.

## 2018-12-05 NOTE — Progress Notes (Signed)
Talked to Dr. Margaretmary Eddy about patient's complaints of head ache, tylenol was given that did not help her, order for Fiorecet given. RN will continue to monitor.

## 2018-12-05 NOTE — Progress Notes (Signed)
Patient moved to room 231 by wheelchair with Ophelia Charter, [place].  Alert with no distress noted when leaving ICU.

## 2018-12-06 LAB — CULTURE, RESPIRATORY W GRAM STAIN: Culture: NORMAL

## 2018-12-06 LAB — PROCALCITONIN: PROCALCITONIN: 0.41 ng/mL

## 2018-12-06 MED ORDER — OXYCODONE-ACETAMINOPHEN 5-325 MG PO TABS
1.0000 | ORAL_TABLET | Freq: Four times a day (QID) | ORAL | Status: DC | PRN
  Administered 2018-12-06: 1 via ORAL
  Filled 2018-12-06: qty 1

## 2018-12-06 MED ORDER — METHIMAZOLE 5 MG PO TABS
5.0000 mg | ORAL_TABLET | Freq: Every day | ORAL | Status: DC
  Administered 2018-12-06: 5 mg via ORAL
  Filled 2018-12-06: qty 1

## 2018-12-06 MED ORDER — METHIMAZOLE 10 MG PO TABS
40.0000 mg | ORAL_TABLET | Freq: Every day | ORAL | Status: DC
  Administered 2018-12-07 – 2018-12-10 (×3): 40 mg via ORAL
  Filled 2018-12-06 (×4): qty 4

## 2018-12-06 MED ORDER — PREDNISONE 20 MG PO TABS
40.0000 mg | ORAL_TABLET | Freq: Every day | ORAL | Status: DC
  Administered 2018-12-07 – 2018-12-10 (×2): 40 mg via ORAL
  Filled 2018-12-06 (×5): qty 2

## 2018-12-06 MED ORDER — METHIMAZOLE 5 MG PO TABS
35.0000 mg | ORAL_TABLET | Freq: Once | ORAL | Status: AC
  Administered 2018-12-06: 35 mg via ORAL
  Filled 2018-12-06 (×2): qty 1

## 2018-12-06 NOTE — Progress Notes (Signed)
Talked to Dr. Jerelyn Charles about patient's BP of 97/75, patient asymptomatic, has scheduled entresto, order to hold. RN will continue to monitor.

## 2018-12-06 NOTE — Progress Notes (Signed)
Talked to Dr. Vianne Bulls about patient's BP of 88/61, patient is asymptomatic, he received IV lasix, coreg and entresto this morning. Order to hold IV lasix and to give Coreg. RN will continue to monitor.

## 2018-12-06 NOTE — Progress Notes (Signed)
[place] NAME: Stephen [name]    MR#:  [id number]  DATE OF BIRTH:  [birth year]  SUBJECTIVE: 52 year old male patient admitted because of CHF exacerbation, he feels well today.  However patient got a voice message from [name] from [place] yesterday evening regarding his hyperthyroid requesting him to take methimazole, prednisone.  CHIEF COMPLAINT:   Chief Complaint  Patient presents with  . Shortness of Breath  . Code STEMI  Complains of pleuritic chest pain denies any other complaints.  REVIEW OF SYSTEMS:    ROS  CONSTITUTIONAL: No documented fever. Has decreased fatigue, weakness. No weight gain, no weight loss.  EYES: No blurry or double vision.  ENT: No tinnitus. No postnasal drip. No redness of the oropharynx.  RESPIRATORY: occasional cough, no wheeze, no hemoptysis. Has no dyspnea.  CARDIOVASCULAR: No chest pain. No orthopnea. No palpitations. No syncope.  GASTROINTESTINAL: No nausea, no vomiting or diarrhea. No abdominal pain. No melena or hematochezia.  GENITOURINARY: No dysuria or hematuria.  ENDOCRINE: No polyuria or nocturia. No heat or cold intolerance.  HEMATOLOGY: No anemia. No bruising. No bleeding.  INTEGUMENTARY: No rashes. No lesions.  MUSCULOSKELETAL: No arthritis. No swelling. No gout.  NEUROLOGIC: No numbness, tingling, or ataxia. No seizure-type activity.  PSYCHIATRIC: No anxiety. No insomnia. No ADD.   DRUG ALLERGIES:   Allergies  Allergen Reactions  . Lisinopril Cough    VITALS:  Blood pressure 115/84, pulse (!) 39, temperature 98 F (36.7 C), temperature source Oral, resp. rate 20, height 6\' 1"  (1.854 m), weight 104.7 kg, SpO2 92 %.  PHYSICAL EXAMINATION:   Physical Exam  GENERAL:  52 y.o.-year-old patient lying in the bed with no acute distress.  EYES: Pupils equal, round, reactive to light and accommodation. No scleral icterus. Extraocular muscles intact.  HEENT: Head atraumatic,  normocephalic. Oropharynx and nasopharynx clear.  NECK:  Supple, no jugular venous distention. No thyroid enlargement, no tenderness.  LUNGS: Improved breath sounds bilaterally, decreased bibasilar rales. No use of accessory muscles of respiration.  CARDIOVASCULAR: S1, S2 normal. No murmurs, rubs, or gallops.  ABDOMEN: Soft, nontender, nondistended. Bowel sounds present. No organomegaly or mass.  EXTREMITIES: No cyanosis, clubbing or edema b/l.    NEUROLOGIC: Cranial nerves II through XII are intact. No focal Motor or sensory deficits b/l.   PSYCHIATRIC: The patient is alert and oriented x 3.  SKIN: No obvious rash, lesion, or ulcer.   LABORATORY PANEL:   CBC Recent Labs  Lab 12/05/18 0334  WBC 11.5*  HGB 12.8*  HCT 40.4  PLT 244   ------------------------------------------------------------------------------------------------------------------ Chemistries  Recent Labs  Lab 12/03/18 1857  12/05/18 0334 12/05/18 0858  NA 140   < > 139  --   K 3.3*   < > 3.0* 3.3*  CL 97*   < > 100  --   CO2 27   < > 31  --   GLUCOSE 292*   < > 105*  --   BUN 37*   < > 44*  --   CREATININE 2.12*   < > 1.80*  --   CALCIUM 9.2   < > 8.8*  --   MG 2.3  --   --   --   AST 22  --   --   --   ALT 18  --   --   --   ALKPHOS 99  --   --   --   BILITOT 0.6  --   --   --    < > =  values in this interval not displayed.   ------------------------------------------------------------------------------------------------------------------  Cardiac Enzymes Recent Labs  Lab 12/04/18 0943  TROPONINI 0.08*   ------------------------------------------------------------------------------------------------------------------  RADIOLOGY:  Dg Chest Port 1 View  Result Date: 12/05/2018 CLINICAL DATA:  Acute respiratory failure EXAM: PORTABLE CHEST 1 VIEW COMPARISON:  12/04/2018 FINDINGS: Cardiac shadow remains enlarged. Pacing device is again seen. Previously seen bilateral airspace disease has improved in  the interval from the prior exam particularly in the right lung base consistent with resolving edema. Some persistent changes are noted however. No sizable effusion is seen. IMPRESSION: Persistent but significantly improved edema. Electronically Signed   By: Inez Catalina M.D.   On: 12/05/2018 06:38     ASSESSMENT AND PLAN:   52 year old male patient with history of dilated cardiomyopathy, chronic left bundle branch block, chronic kidney disease stage III, hyperlipidemia, ICD currently in ICU for respiratory distress  -Status post hypoxic respiratory failure Weaned of BiPAP Weaned off oxygen via nasal cannula IV Lasix for diuresis Continue IV Lasix today.  Patient is off oxygen.  -Acute on chronic systolic heart failure exacerbation Continue diuresis with Lasix, patient weight is down by 3 kg. Continue Coreg, Entresto and Aldactone and statin medication Intake and output charts along with daily body weights Has history of severe cardiomyopathy with poor ejection fraction of 15% Status post cardiology evaluation   -Elevated troponin probably secondary to demand ischemia from underlying heart failure  -History of gout attack Continue colchicine and allopurinol  -CKD stage III Monitor renal function  -COPD Continue inhaler treatments  -Hyperlipidemia Continue statin medication  -Healthcare associated pneumonia On IV cefepime antibiotic Follow-up cultures MRSA PCR is negative  -  Amiodarone induced thyrotoxicosis: Recommended to start methimazole 40 mg daily, prednisone 40 mg daily and see [name] in 6 weeks needs repeat TSH, free T4 in 1 month.  Started on the same as per Ep recommendation  All the records are reviewed and case discussed with Care Management/Social Worker. Management plans discussed with the patient, family and they are in agreement.  CODE STATUS: Full code  DVT Prophylaxis: SCDs  TOTAL TIME TAKING CARE OF THIS PATIENT: 33 minutes.  Moe than 50%  time spent in counseling and coordination of care  POSSIBLE D/C IN 2 to 3 DAYS, DEPENDING ON CLINICAL CONDITION.  Stephen [name] M.D on 12/06/2018 at 11:59 AM  Between 7am to 6pm - Pager - [phone]  After 6pm go to [email or web address] - password [email or web address] [facility]  Office  [phone]  CC: Primary care physician; Valerie Roys, DO  Note: This dictation was prepared with Dragon dictation along with smaller phrase technology. Any transcriptional errors that result from this process are unintentional.

## 2018-12-07 ENCOUNTER — Inpatient Hospital Stay: Payer: Commercial Managed Care - PPO

## 2018-12-07 MED ORDER — SENNA 8.6 MG PO TABS
1.0000 | ORAL_TABLET | Freq: Every day | ORAL | Status: DC | PRN
  Administered 2018-12-07: 8.6 mg via ORAL
  Filled 2018-12-07: qty 1

## 2018-12-07 MED ORDER — SODIUM CHLORIDE 0.9 % IV SOLN
INTRAVENOUS | Status: DC | PRN
  Administered 2018-12-07 – 2018-12-08 (×2): 500 mL via INTRAVENOUS

## 2018-12-07 MED ORDER — FUROSEMIDE 10 MG/ML IJ SOLN
40.0000 mg | Freq: Two times a day (BID) | INTRAMUSCULAR | Status: DC
  Administered 2018-12-07 – 2018-12-08 (×3): 40 mg via INTRAVENOUS
  Filled 2018-12-07 (×3): qty 4

## 2018-12-07 MED ORDER — DOCUSATE SODIUM 100 MG PO CAPS
100.0000 mg | ORAL_CAPSULE | Freq: Two times a day (BID) | ORAL | Status: DC
  Administered 2018-12-07 – 2018-12-10 (×3): 100 mg via ORAL
  Filled 2018-12-07 (×7): qty 1

## 2018-12-07 NOTE — Progress Notes (Signed)
MD contacted & informed of patient's BP being a little soft, 108/55, and to receive IV Lasix 60mg  this morning. MD stated she was would decrease dose of medication & enter new order.

## 2018-12-07 NOTE — Progress Notes (Signed)
[place] [facility] [place] NAME: Simcha Speir    MR#:  [id number]  DATE OF BIRTH:  [birth year]  SUBJECTIVE: Complains of pleuritic chest pain, noted to have soft blood pressure.  CHIEF COMPLAINT:   Chief Complaint  Patient presents with  . Shortness of Breath  . Code STEMI  Complains of pleuritic chest pain denies any other complaints.  REVIEW OF SYSTEMS:    ROS  CONSTITUTIONAL: No documented fever. Has decreased fatigue, weakness. No weight gain, no weight loss.  EYES: No blurry or double vision.  ENT: No tinnitus. No postnasal drip. No redness of the oropharynx.  RESPIRATORY: occasional cough, no wheeze, no hemoptysis. Has no dyspnea.  CARDIOVASCULAR: No chest pain. No orthopnea. No palpitations. No syncope.  GASTROINTESTINAL: No nausea, no vomiting or diarrhea. No abdominal pain. No melena or hematochezia.  GENITOURINARY: No dysuria or hematuria.  ENDOCRINE: No polyuria or nocturia. No heat or cold intolerance.  HEMATOLOGY: No anemia. No bruising. No bleeding.  INTEGUMENTARY: No rashes. No lesions.  MUSCULOSKELETAL: No arthritis. No swelling. No gout.  NEUROLOGIC: No numbness, tingling, or ataxia. No seizure-type activity.  PSYCHIATRIC: No anxiety. No insomnia. No ADD.   DRUG ALLERGIES:   Allergies  Allergen Reactions  . Lisinopril Cough    VITALS:  Blood pressure (!) 108/55, pulse (!) 124, temperature 98.3 F (36.8 C), temperature source Oral, resp. rate 19, height 6\' 1"  (1.854 m), weight 104.7 kg, SpO2 95 %.  PHYSICAL EXAMINATION:   Physical Exam  GENERAL:  52 y.o.-year-old patient lying in the bed with no acute distress.  EYES: Pupils equal, round, reactive to light and accommodation. No scleral icterus. Extraocular muscles intact.  HEENT: Head atraumatic, normocephalic. Oropharynx and nasopharynx clear.  NECK:  Supple, no jugular venous distention. No thyroid enlargement, no tenderness.  LUNGS: Improved breath sounds  bilaterally, decreased bibasilar rales. No use of accessory muscles of respiration.  CARDIOVASCULAR: S1, S2 normal. No murmurs, rubs, or gallops.  ABDOMEN: Soft, nontender, nondistended. Bowel sounds present. No organomegaly or mass.  EXTREMITIES: No cyanosis, clubbing or edema b/l.    NEUROLOGIC: Cranial nerves II through XII are intact. No focal Motor or sensory deficits b/l.   PSYCHIATRIC: The patient is alert and oriented x 3.  SKIN: No obvious rash, lesion, or ulcer.   LABORATORY PANEL:   CBC Recent Labs  Lab 12/05/18 0334  WBC 11.5*  HGB 12.8*  HCT 40.4  PLT 244   ------------------------------------------------------------------------------------------------------------------ Chemistries  Recent Labs  Lab 12/03/18 1857  12/05/18 0334 12/05/18 0858  NA 140   < > 139  --   K 3.3*   < > 3.0* 3.3*  CL 97*   < > 100  --   CO2 27   < > 31  --   GLUCOSE 292*   < > 105*  --   BUN 37*   < > 44*  --   CREATININE 2.12*   < > 1.80*  --   CALCIUM 9.2   < > 8.8*  --   MG 2.3  --   --   --   AST 22  --   --   --   ALT 18  --   --   --   ALKPHOS 99  --   --   --   BILITOT 0.6  --   --   --    < > = values in this interval not displayed.   ------------------------------------------------------------------------------------------------------------------  Cardiac Enzymes  Recent Labs  Lab 12/04/18 0943  TROPONINI 0.08*   ------------------------------------------------------------------------------------------------------------------  RADIOLOGY:  No results found.   ASSESSMENT AND PLAN:   52 year old male patient with history of dilated cardiomyopathy, chronic left bundle branch block, chronic kidney disease stage III, hyperlipidemia, ICD currently in ICU for respiratory distress  -Acute respiratory failure with hypoxia secondary to acute on chronic systolic heart failure: Weaned of BiPAP Weaned off oxygen via nasal cannula, patient on room air right now. Decrease IV  diuretics because patient blood pressure is soft.   -Acute on chronic systolic heart failure exacerbation Continue diuresis with Lasix, doses adjusted today.  reaching euvolemic status   Continue Coreg, Entresto and Aldactone and statin medication Intake and output charts along with daily body weights  Has history of severe cardiomyopathy with poor ejection fraction of 15%, status post ICD, follows up with Dr. Virl Axe from electrophysiology. Hypokalemia: Replace potassium.   -Elevated troponin probably secondary to demand ischemia from underlying heart failure  -History of gout attack Continue colchicine and allopurinol  -CKD stage III Monitor renal function  -COPD Continue inhaler treatments  -Hyperlipidemia Continue statin medication  -Healthcare associated pneumonia On IV cefepime antibiotic patient procalcitonin level is decreasing. rpt chest x-ray today, MRSA PCR is negative  -  Amiodarone induced thyrotoxicosis: Recommended to start methimazole 40 mg daily, prednisone 40 mg daily and see [name] in 6 weeks needs repeat TSH, free T4 in 1 month.  Started on the same as per Ep recommendation.  Same discussed with patient.  If patient persistently hypotensive will hold Entresto, patient cardiology following.  All the records are reviewed and case discussed with Care Management/Social Worker. Management plans discussed with the patient, family and they are in agreement.  CODE STATUS: Full code  DVT Prophylaxis: SCDs  TOTAL TIME TAKING CARE OF THIS PATIENT: 35 minutes.  Moe than 50% time spent in counseling and coordination of care  POSSIBLE D/C IN 2 to 3 DAYS, DEPENDING ON CLINICAL CONDITION. Other than 50% time spent in counseling, coordination of care Epifanio Lesches M.D on 12/07/2018 at 9:13 AM  Between 7am to 6pm - Pager - [phone]  After 6pm go to [email or web address] - password [email or web address] [facility]  Office   [phone]  CC: Primary care physician; Valerie Roys, DO  Note: This dictation was prepared with Dragon dictation along with smaller phrase technology. Any transcriptional errors that result from this process are unintentional.

## 2018-12-07 NOTE — Plan of Care (Signed)
  Problem: Education: Goal: Knowledge of General Education information will improve Description Including pain rating scale, medication(s)/side effects and non-pharmacologic comfort measures Outcome: Completed/Met   Problem: Activity: Goal: Risk for activity intolerance will decrease Outcome: Completed/Met   Problem: Nutrition: Goal: Adequate nutrition will be maintained Outcome: Completed/Met   Problem: Coping: Goal: Level of anxiety will decrease Outcome: Completed/Met   Problem: Elimination: Goal: Will not experience complications related to bowel motility Outcome: Completed/Met   Problem: Pain Managment: Goal: General experience of comfort will improve Outcome: Completed/Met   Problem: Safety: Goal: Ability to remain free from injury will improve Outcome: Completed/Met   Problem: Skin Integrity: Goal: Risk for impaired skin integrity will decrease Outcome: Completed/Met

## 2018-12-08 LAB — BASIC METABOLIC PANEL
ANION GAP: 11 (ref 5–15)
BUN: 38 mg/dL — ABNORMAL HIGH (ref 6–20)
CO2: 23 mmol/L (ref 22–32)
Calcium: 9.5 mg/dL (ref 8.9–10.3)
Chloride: 104 mmol/L (ref 98–111)
Creatinine, Ser: 1.68 mg/dL — ABNORMAL HIGH (ref 0.61–1.24)
GFR calc Af Amer: 53 mL/min — ABNORMAL LOW (ref >60)
GFR calc non Af Amer: 46 mL/min — ABNORMAL LOW (ref >60)
Glucose, Bld: 165 mg/dL — ABNORMAL HIGH (ref 70–99)
Potassium: 3.9 mmol/L (ref 3.5–5.1)
Sodium: 138 mmol/L (ref 135–145)

## 2018-12-08 LAB — CBC
HCT: 38.7 % — ABNORMAL LOW (ref 39.0–52.0)
HEMOGLOBIN: 12.5 g/dL — AB (ref 13.0–17.0)
MCH: 28.2 pg (ref 26.0–34.0)
MCHC: 32.3 g/dL (ref 30.0–36.0)
MCV: 87.4 fL (ref 80.0–100.0)
Platelets: 262 10*3/uL (ref 150–400)
RBC: 4.43 MIL/uL (ref 4.22–5.81)
RDW: 16.4 % — ABNORMAL HIGH (ref 11.5–15.5)
WBC: 11.5 10*3/uL — ABNORMAL HIGH (ref 4.0–10.5)
nRBC: 0 % (ref 0.0–0.2)

## 2018-12-08 MED ORDER — SODIUM CHLORIDE 0.9 % IV SOLN
2.0000 g | Freq: Two times a day (BID) | INTRAVENOUS | Status: AC
  Administered 2018-12-08: 2 g via INTRAVENOUS
  Filled 2018-12-08: qty 2

## 2018-12-08 NOTE — Progress Notes (Signed)
Cardiovascular and Pulmonary Nurse Navigator Note:   Patient has functioning scale.  Weighs daily.  Is currently enrolled in Cardiac Rehab program here at [facility]  Patient refused booklet, "Living Better with Heart Failure."  Patient informed this RN that he already has this booklet as well as the HF Zone magnet.   Patient is followed in the [facility] - next f/u appointment is scheduled for 12/16/2018 @ 11:20 a.m.    Reviewed the 5 Steps to Living Better with Heart Failure with patient.   1. Weigh daily 2. Assess your symptoms and how you are feeling 3. Take heart failure medications as prescribed 4. Eat low sodium diet - 2 gm sodium - Follow fluid restriction of less than 2 liters per day or as instructed by MD.  Patient reports he is following low sodium diet.   5. Get moving.  Patient is currently enrolled in the [facility] Cardiac Rehab program.   Patient is going twice a week.  Patient informed this RN that he is enjoying Cardiac Rehab.  Patient is a former smoker.    Patient verbalized appreciation of the above information.     Roanna Epley, RN, BSN, [facility]  Cardiovascular & Pulmonary Nurse Navigator  Direct Line: [phone]  Department Phone #: [phone] Fax: [phone]  Email Address: [email or web address]

## 2018-12-08 NOTE — Progress Notes (Signed)
[place] [facility] [place] NAME: Stephen [name]    MR#:  [id number]  DATE OF BIRTH:  1966/07/15  SUBJECTIVE: Complains of pleuritic chest pain, noted to have soft blood pressure.   Still becoming short of breath with ambulation.  States that he had some wheezing this morning.  No cough, no fevers, no chills.  REVIEW OF SYSTEMS:    ROS  CONSTITUTIONAL: No documented fever. Has decreased fatigue, weakness. No weight gain, no weight loss.  EYES: No blurry or double vision.  ENT: No tinnitus. No postnasal drip. No redness of the oropharynx.  RESPIRATORY: occasional cough, no wheeze, no hemoptysis. + Dyspnea on exertion CARDIOVASCULAR: No chest pain. No orthopnea. No palpitations. No syncope.  GASTROINTESTINAL: No nausea, no vomiting or diarrhea. No abdominal pain. No melena or hematochezia.  GENITOURINARY: No dysuria or hematuria.  ENDOCRINE: No polyuria or nocturia. No heat or cold intolerance.  HEMATOLOGY: No anemia. No bruising. No bleeding.  INTEGUMENTARY: No rashes. No lesions.  MUSCULOSKELETAL: No arthritis. No swelling. No gout.  NEUROLOGIC: No numbness, tingling, or ataxia. No seizure-type activity.  PSYCHIATRIC: No anxiety. No insomnia. No ADD.   DRUG ALLERGIES:   Allergies  Allergen Reactions  . Lisinopril Cough    VITALS:  Blood pressure 128/76, pulse 71, temperature 97.7 F (36.5 C), temperature source Oral, resp. rate 19, height 6\' 1"  (1.854 m), weight 104.7 kg, SpO2 99 %.  PHYSICAL EXAMINATION:   Physical Exam  GENERAL:  52 y.o.-year-old patient sitting up in chair with no acute distress.  EYES: Pupils equal, round, reactive to light and accommodation. No scleral icterus. Extraocular muscles intact.  HEENT: Head atraumatic, normocephalic. Oropharynx and nasopharynx clear.  NECK:  Supple, no jugular venous distention. No thyroid enlargement, no tenderness.  LUNGS: Improved breath sounds bilaterally, + mild bibasilar crackles  present. No use of accessory muscles of respiration.  CARDIOVASCULAR: RRR, S1, S2 normal. No murmurs, rubs, or gallops.  ABDOMEN: Soft, nontender, nondistended. Bowel sounds present. No organomegaly or mass.  EXTREMITIES: No cyanosis, clubbing. + mild pedal edema bilaterally. NEUROLOGIC: Cranial nerves II through XII are intact. No focal Motor or sensory deficits b/l.   PSYCHIATRIC: The patient is alert and oriented x 3.  SKIN: No obvious rash, lesion, or ulcer.   LABORATORY PANEL:   CBC Recent Labs  Lab 12/08/18 0948  WBC 11.5*  HGB 12.5*  HCT 38.7*  PLT 262   ------------------------------------------------------------------------------------------------------------------ Chemistries  Recent Labs  Lab 12/03/18 1857  12/08/18 0948  NA 140   < > 138  K 3.3*   < > 3.9  CL 97*   < > 104  CO2 27   < > 23  GLUCOSE 292*   < > 165*  BUN 37*   < > 38*  CREATININE 2.12*   < > 1.68*  CALCIUM 9.2   < > 9.5  MG 2.3  --   --   AST 22  --   --   ALT 18  --   --   ALKPHOS 99  --   --   BILITOT 0.6  --   --    < > = values in this interval not displayed.   ------------------------------------------------------------------------------------------------------------------  Cardiac Enzymes Recent Labs  Lab 12/04/18 0943  TROPONINI 0.08*   ------------------------------------------------------------------------------------------------------------------  RADIOLOGY:  Dg Chest Port 1 View  Result Date: 12/07/2018 CLINICAL DATA:  Shortness of breath. Congestive heart failure. Acute respiratory failure with hypoxia. EXAM: PORTABLE CHEST 1 VIEW COMPARISON:  12/05/2018 FINDINGS: Single lead transvenous pacemaker remains in place. Marked cardiac enlargement is unchanged. There has been mild improvement in bilateral interstitial infiltrates and airspace opacity since previous study, consistent with decreasing pulmonary edema. No evidence of pulmonary consolidation or significant pleural  effusion. IMPRESSION: Mild improvement in diffuse pulmonary edema.  Stable cardiomegaly. Electronically Signed   By: Earle Gell M.D.   On: 12/07/2018 13:13     ASSESSMENT AND PLAN:   Acute respiratory failure with hypoxia secondary to acute on chronic systolic heart failure and HCAP- currently on room air, but still having dyspnea on exertion -Continue home inhalers -Ambulate with pulse ox prior to discharge  Acute on chronic systolic heart failure- recent EF 10-15%, s/p ICD. -IV lasix for 1 more day -Continue Coreg, Entresto, Aldactone, and statin -Strict I/O, daily weights  HCAP- possible pneumonia seen on CT chest.  Procalcitonin also elevated. -Will complete 5-day course of antibiotics today  Elevated troponin probably secondary to demand ischemia from underlying heart failure- no active chest pain  History of gout -Continue colchicine and allopurinol  CKD stage III- creatinine improved from baseline -Monitor renal function  COPD- stable, no signs of acute exacerbation Continue inhaler treatments  Hyperlipidemia -Continue statin  Amiodarone induced thyrotoxicosis:  -Continue methimazole 40 mg daily and prednisone 40 mg daily -f/u with [name] in 6 weeks  -Needs repeat TSH, free T4 in 1 month  All the records are reviewed and case discussed with Care Management/Social Worker. Management plans discussed with the patient, family and they are in agreement.  CODE STATUS: Full code  DVT Prophylaxis: SCDs  TOTAL TIME TAKING CARE OF THIS PATIENT: 35 minutes.  Moe than 50% time spent in counseling and coordination of care  POSSIBLE D/C tomorrow, DEPENDING ON CLINICAL CONDITION.  Other than 50% time spent in counseling, coordination of care Evette Doffing M.D on 12/08/2018 at 3:17 PM  Between 7am to 6pm - Pager - [phone]  After 6pm go to [email or web address] - password [email or web address] [facility]  Office  [phone]  CC: Primary care physician; Valerie Roys, DO  Note: This dictation was prepared with Dragon dictation along with smaller phrase technology. Any transcriptional errors that result from this process are unintentional.

## 2018-12-09 ENCOUNTER — Inpatient Hospital Stay: Payer: Commercial Managed Care - PPO

## 2018-12-09 LAB — BASIC METABOLIC PANEL
Anion gap: 6 (ref 5–15)
BUN: 41 mg/dL — ABNORMAL HIGH (ref 6–20)
CO2: 26 mmol/L (ref 22–32)
Calcium: 9.3 mg/dL (ref 8.9–10.3)
Chloride: 106 mmol/L (ref 98–111)
Creatinine, Ser: 1.57 mg/dL — ABNORMAL HIGH (ref 0.61–1.24)
GFR calc Af Amer: 57 mL/min — ABNORMAL LOW (ref >60)
GFR calc non Af Amer: 50 mL/min — ABNORMAL LOW (ref >60)
Glucose, Bld: 99 mg/dL (ref 70–99)
Potassium: 4.3 mmol/L (ref 3.5–5.1)
Sodium: 138 mmol/L (ref 135–145)

## 2018-12-09 LAB — CBC
HEMATOCRIT: 39.3 % (ref 39.0–52.0)
HEMOGLOBIN: 12.6 g/dL — AB (ref 13.0–17.0)
MCH: 28.4 pg (ref 26.0–34.0)
MCHC: 32.1 g/dL (ref 30.0–36.0)
MCV: 88.7 fL (ref 80.0–100.0)
Platelets: 271 10*3/uL (ref 150–400)
RBC: 4.43 MIL/uL (ref 4.22–5.81)
RDW: 16.9 % — ABNORMAL HIGH (ref 11.5–15.5)
WBC: 9.7 10*3/uL (ref 4.0–10.5)
nRBC: 0 % (ref 0.0–0.2)

## 2018-12-09 LAB — CULTURE, BLOOD (ROUTINE X 2)
Culture: NO GROWTH
Culture: NO GROWTH
Special Requests: ADEQUATE

## 2018-12-09 MED ORDER — POTASSIUM CHLORIDE CRYS ER 20 MEQ PO TBCR: 20.0000 meq | EXTENDED_RELEASE_TABLET | Freq: Four times a day (QID) | ORAL | 0 refills | Status: DC

## 2018-12-09 MED ORDER — METHIMAZOLE 10 MG PO TABS: 40.0000 mg | ORAL_TABLET | Freq: Every day | ORAL | 0 refills | Status: DC

## 2018-12-09 MED ORDER — PREDNISONE 20 MG PO TABS: 40.0000 mg | ORAL_TABLET | Freq: Every day | ORAL | 0 refills | Status: DC

## 2018-12-09 NOTE — Progress Notes (Signed)
Stephen [name] NAME: Stephen [name]    MR#:  [id number]  DATE OF BIRTH:  27-Feb-1966  SUBJECTIVE:    Patient having nausea and vomiting this morning.  Also with some hypotension.  States he "just does not feel quite right".  No chest pain, no shortness of breath, no fevers, no chills.  REVIEW OF SYSTEMS:    ROS  CONSTITUTIONAL: No documented fever. Has decreased fatigue, weakness. No weight gain, no weight loss.  EYES: No blurry or double vision.  ENT: No tinnitus. No postnasal drip. No redness of the oropharynx.  RESPIRATORY: occasional cough, no wheeze, no hemoptysis. + Dyspnea on exertion CARDIOVASCULAR: No chest pain. No orthopnea. No palpitations. No syncope.  GASTROINTESTINAL: + Nausea , + vomiting, no diarrhea. No abdominal pain. No melena or hematochezia.  GENITOURINARY: No dysuria or hematuria.  ENDOCRINE: No polyuria or nocturia. No heat or cold intolerance.  HEMATOLOGY: No anemia. No bruising. No bleeding.  INTEGUMENTARY: No rashes. No lesions.  MUSCULOSKELETAL: No arthritis. No swelling. No gout.  NEUROLOGIC: No numbness, tingling, or ataxia. No seizure-type activity.  PSYCHIATRIC: No anxiety. No insomnia. No ADD.   DRUG ALLERGIES:   Allergies  Allergen Reactions  . Lisinopril Cough    VITALS:  Blood pressure (!) 133/110, pulse 76, temperature 97.7 F (36.5 C), temperature source Oral, resp. rate 18, height 6\' 1"  (1.854 m), weight 106.7 kg, SpO2 100 %.  PHYSICAL EXAMINATION:   Physical Exam  GENERAL:  52 y.o.-year-old patient sitting up in bed with no acute distress.  EYES: Pupils equal, round, reactive to light and accommodation. No scleral icterus. Extraocular muscles intact.  HEENT: Head atraumatic, normocephalic. Oropharynx and nasopharynx clear.  Moist mucous membranes NECK:  Supple, no jugular venous distention. No thyroid enlargement, no tenderness.  LUNGS: Improved breath sounds bilaterally, no crackles  noted, no use of accessory muscles of respiration.  CARDIOVASCULAR: RRR, S1, S2 normal. No murmurs, rubs, or gallops.  ABDOMEN: Soft, nontender, nondistended. Bowel sounds present. No organomegaly or mass.  EXTREMITIES: No cyanosis, clubbing. + Trace pedal edema bilaterally. NEUROLOGIC: Cranial nerves II through XII are intact. No focal Motor or sensory deficits b/l.   PSYCHIATRIC: The patient is alert and oriented x 3.  SKIN: No obvious rash, lesion, or ulcer.   LABORATORY PANEL:   CBC Recent Labs  Lab 12/09/18 0421  WBC 9.7  HGB 12.6*  HCT 39.3  PLT 271   ------------------------------------------------------------------------------------------------------------------ Chemistries  Recent Labs  Lab 12/03/18 1857  12/09/18 0421  NA 140   < > 138  K 3.3*   < > 4.3  CL 97*   < > 106  CO2 27   < > 26  GLUCOSE 292*   < > 99  BUN 37*   < > 41*  CREATININE 2.12*   < > 1.57*  CALCIUM 9.2   < > 9.3  MG 2.3  --   --   AST 22  --   --   ALT 18  --   --   ALKPHOS 99  --   --   BILITOT 0.6  --   --    < > = values in this interval not displayed.   ------------------------------------------------------------------------------------------------------------------  Cardiac Enzymes Recent Labs  Lab 12/04/18 0943  TROPONINI 0.08*   ------------------------------------------------------------------------------------------------------------------  RADIOLOGY:  No results found.   ASSESSMENT AND PLAN:   Acute on chronic systolic heart failure- recent EF 10-15%, s/p ICD. -Hold Lasix today due to  hypotension -Hold Coreg, Entresto, spironolactone -Continue statin -Strict I/O, daily weights  HCAP- possible pneumonia seen on CT chest.  Procalcitonin also elevated. -Completed 5-day course of antibiotics  Hypotension- new this morning. Likely related to overdiuresis. -Hold Lasix, Coreg, Entresto, spironolactone -Encourage p.o. Intake  Nausea/vomiting- likely related to  hypotension.  Abdominal exam is benign.  Last BM 12/8. -IV antiemetics -Check abdominal x-ray  Elevated troponin probably secondary to demand ischemia from underlying heart failure- no active chest pain  History of gout- no acute flare -Continue colchicine and allopurinol  CKD stage III- creatinine improved from baseline -Monitor renal function  COPD- stable, no signs of acute exacerbation Continue inhaler treatments  Hyperlipidemia- stable -Continue statin  Amiodarone induced thyrotoxicosis -Continue methimazole 40 mg daily and prednisone 40 mg daily -f/u with [name] in 6 weeks  -Needs repeat TSH, free T4 in 1 month  All the records are reviewed and case discussed with Care Management/Social Worker. Management plans discussed with the patient, family and they are in agreement.  CODE STATUS: Full code  DVT Prophylaxis: heparin  TOTAL TIME TAKING CARE OF THIS PATIENT: 35 minutes.   Moe than 50% time spent in counseling and coordination of care  POSSIBLE D/C tomorrow, DEPENDING ON CLINICAL CONDITION.  Other than 50% time spent in counseling, coordination of care Evette Doffing M.D on 12/09/2018 at 4:27 PM  Between 7am to 6pm - Pager - [phone]  After 6pm go to [email or web address] - password [email or web address]  Office  [phone]  CC: Primary care physician; Valerie Roys, DO  Note: This dictation was prepared with Dragon dictation along with smaller phrase technology. Any transcriptional errors that result from this process are unintentional.

## 2018-12-09 NOTE — Plan of Care (Signed)
  Problem: Clinical Measurements: ?Goal: Respiratory complications will improve ?Outcome: Progressing ?  ?Problem: Elimination: ?Goal: Will not experience complications related to urinary retention ?Outcome: Progressing ?  ?Problem: Activity: ?Goal: Capacity to carry out activities will improve ?Outcome: Progressing ?  ?

## 2018-12-09 NOTE — Care Management (Signed)
Barrier to discharge-hypotension and N/V today.  Discharge order canceled.

## 2018-12-09 NOTE — Plan of Care (Signed)
Patients BP 81/53 this AM, now much improved however patient having episodes of nausea/vomiting and has been drowsy most of the day. MD notified. Orders to cancel d/c order. Will continue to assess and monitor.   Problem: Clinical Measurements: Goal: Ability to maintain clinical measurements within normal limits will improve Outcome: Not Progressing

## 2018-12-10 ENCOUNTER — Encounter: Payer: Self-pay | Admitting: *Deleted

## 2018-12-10 DIAGNOSIS — I5022 Chronic systolic (congestive) heart failure: Secondary | ICD-10-CM

## 2018-12-10 LAB — BASIC METABOLIC PANEL
ANION GAP: 7 (ref 5–15)
BUN: 34 mg/dL — ABNORMAL HIGH (ref 6–20)
CALCIUM: 9.1 mg/dL (ref 8.9–10.3)
CO2: 25 mmol/L (ref 22–32)
Chloride: 106 mmol/L (ref 98–111)
Creatinine, Ser: 1.49 mg/dL — ABNORMAL HIGH (ref 0.61–1.24)
GFR calc non Af Amer: 53 mL/min — ABNORMAL LOW (ref >60)
Glucose, Bld: 102 mg/dL — ABNORMAL HIGH (ref 70–99)
Potassium: 4.5 mmol/L (ref 3.5–5.1)
Sodium: 138 mmol/L (ref 135–145)

## 2018-12-10 LAB — CBC
HCT: 37.4 % — ABNORMAL LOW (ref 39.0–52.0)
Hemoglobin: 11.9 g/dL — ABNORMAL LOW (ref 13.0–17.0)
MCH: 28.3 pg (ref 26.0–34.0)
MCHC: 31.8 g/dL (ref 30.0–36.0)
MCV: 88.8 fL (ref 80.0–100.0)
Platelets: 264 10*3/uL (ref 150–400)
RBC: 4.21 MIL/uL — ABNORMAL LOW (ref 4.22–5.81)
RDW: 17 % — ABNORMAL HIGH (ref 11.5–15.5)
WBC: 8.7 10*3/uL (ref 4.0–10.5)
nRBC: 0 % (ref 0.0–0.2)

## 2018-12-10 MED ORDER — SENNOSIDES-DOCUSATE SODIUM 8.6-50 MG PO TABS
2.0000 | ORAL_TABLET | Freq: Two times a day (BID) | ORAL | Status: DC
  Administered 2018-12-10: 2 via ORAL
  Filled 2018-12-10: qty 2

## 2018-12-10 NOTE — Discharge Instructions (Signed)
It was so nice to meet you during this hospitalization!  You came into the hospital with shortness of breath. We thought this was caused by a pneumonia in addition to your congestive heart failure. You were treated for pneumonia with antibiotics through your IV. You do not need to take any additional antibiotics when you go home.  We found that the amiodarone was causing thyroid problems. Please STOP the amiodarone. We have started two medications to help treat your thyroid: 1. Please take prednisone 40mg  daily 2. Please take methimazole 4 tablets once daily  It is very important that you follow-up with your primary care doctor and your cardiologist in the next couple of weeks.  -Dr. Brett Albino

## 2018-12-10 NOTE — Discharge Summary (Signed)
Stephen [name] at [place] NAME: Stephen [name]    MR#:  [id number]  DATE OF BIRTH:  10-Oct-1966  DATE OF ADMISSION:  12/03/2018   ADMITTING PHYSICIAN: Henreitta Leber, MD  DATE OF DISCHARGE: 12/10/18  PRIMARY CARE PHYSICIAN: Valerie Roys, DO   ADMISSION DIAGNOSIS:  Flash pulmonary edema (HCC) [J81.0] Acute respiratory failure with hypoxia (HCC) [J96.01] Acute on chronic congestive heart failure, unspecified heart failure type (HCC) [I50.9] Acute on chronic respiratory failure with hypoxemia (HCC) [J96.21] DISCHARGE DIAGNOSIS:  Active Problems:   Acute respiratory failure with hypoxia (HCC)   Acute respiratory failure (HCC)   Acute on chronic respiratory failure with hypoxemia ([place])  SECONDARY DIAGNOSIS:   Past Medical History:  Diagnosis Date  . AICD (automatic cardioverter/defibrillator) present   . AKI (acute kidney injury) ([place]) 12/24/2017  . Arrhythmia   . Arthritis    "lower back, knees" (06/10/2018)  . Cardiac arrest ([place]) 12/23/2017   Brief V-fib arrest  . Chest pain 09/08/2017  . CHF (congestive heart failure) (HCC)    nonischemic cardiomyopathy, EF 25%  . Gout    "on daily RX" (06/10/2018)  . Headache    "q couple months" (06/10/2018)  . High cholesterol   . History of kidney stones   . Hypertension   . Influenza A 12/24/2017  . NICM (nonischemic cardiomyopathy) ([place])   . Pneumonia 12/20/2017  . TIA (transient ischemic attack) 06/21/2016   "still affects my memory a little bit" (06/10/2018)   HOSPITAL COURSE:   Stephen [name] is a 52 year old male who presented to the ED with shortness of breath.  In the ED, patient was noted to have acute respiratory failure with hypoxia, felt to be secondary to CHF exacerbation.  He was placed on BiPAP and given IV diuretics.  He was admitted for further management.  Acute on chronic systolic congestive heart failure-much improved -Initially on BiPAP, then weaned placed on nasal  cannula, then weaned to room air -Treated with IV diuretics, home torsemide restarted on discharge -Continue Coreg, Entresto, and spironolactone -Seen by cardiology, who did not recommend any additional cardiac work-up this admission  HCAP- CT chest consistent with pneumonia.  Procalcitonin elevated. -Completed 5-day course of antibiotics  Hypotension- had some episodes of hypotension, likely related to overdiuresis.  Home heart failure meds held for a couple of days.  Blood pressures improved on the day of discharge.  Elevated troponin-felt to be due to demand ischemia.  Troponin peaked at 0.09.  No active chest pain this admission.  Amiodarone induced thyrotoxicosis -Amiodarone stopped this admission -Patient started on methimazole 40 mg daily and prednisone 40 mg daily -Patient needs to follow-up with [name] in 6 weeks -Needs repeat TSH and T4 in 1 month  DISCHARGE CONDITIONS:  Acute on chronic systolic congestive heart failure Elevated troponin Gout CKD 3 COPD Hyperlipidemia Amiodarone-induced thyrotoxicosis CONSULTS OBTAINED:  Treatment Team:  Corey Skains, MD DRUG ALLERGIES:   Allergies  Allergen Reactions  . Lisinopril Cough   DISCHARGE MEDICATIONS:   Allergies as of 12/10/2018      Reactions   Lisinopril Cough      Medication List    STOP taking these medications   amiodarone 200 MG tablet Commonly known as:  PACERONE   ciprofloxacin 500 MG tablet Commonly known as:  CIPRO   naproxen 500 MG tablet Commonly known as:  NAPROSYN   tamsulosin 0.4 MG Caps capsule Commonly known as:  FLOMAX     TAKE  these medications   acetaminophen 325 MG tablet Commonly known as:  TYLENOL Take 2 tablets (650 mg total) by mouth every 6 (six) hours as needed. What changed:    how much to take  reasons to take this   albuterol 108 (90 Base) MCG/ACT inhaler Commonly known as:  PROVENTIL HFA;VENTOLIN HFA Inhale 2 puffs into the lungs every 4 (four) hours as  needed for wheezing or shortness of breath.   allopurinol 100 MG tablet Commonly known as:  ZYLOPRIM Take 2 tablets (200 mg total) by mouth daily.   aspirin 81 MG EC tablet Take 1 tablet (81 mg total) by mouth daily.   atorvastatin 40 MG tablet Commonly known as:  LIPITOR Take 80 mg by mouth daily at 6 PM.   carvedilol 3.125 MG tablet Commonly known as:  COREG Take 1 tablet (3.125 mg total) by mouth 2 (two) times daily with a meal.   Chest Rub 4.8-1.2-2.6 % Oint Apply 1 application topically daily as needed (congestion).   clopidogrel 75 MG tablet Commonly known as:  PLAVIX Take 1 tablet (75 mg total) by mouth daily.   colchicine 0.6 MG tablet Take 1 tablet (0.6 mg total) by mouth daily as needed (gout flare).   cyclobenzaprine 5 MG tablet Commonly known as:  FLEXERIL Take 1 tablet (5 mg total) by mouth 3 (three) times daily as needed (jaw pain).   diclofenac sodium 1 % Gel Commonly known as:  VOLTAREN Apply 2 g topically 4 (four) times daily as needed (pain).   fluticasone 50 MCG/ACT nasal spray Commonly known as:  FLONASE Place 2 sprays into both nostrils daily. What changed:    when to take this  reasons to take this   fluticasone-salmeterol 115-21 MCG/ACT inhaler Commonly known as:  ADVAIR HFA Inhale 2 puffs into the lungs 2 (two) times daily.   ICY HOT EX Apply 1 application topically daily as needed (pain).   meloxicam 15 MG tablet Commonly known as:  MOBIC Take 15 mg by mouth daily.   methimazole 10 MG tablet Commonly known as:  TAPAZOLE Take 4 tablets (40 mg total) by mouth daily.   metolazone 2.5 MG tablet Commonly known as:  ZAROXOLYN Take 1 tablet (2.5 mg total) by mouth once daily TAKE 30 MINUTES PRIOR TO LASIX   multivitamin with minerals Tabs tablet Take 1 tablet by mouth daily.   nitroGLYCERIN 0.4 MG SL tablet Commonly known as:  NITROSTAT Place 1 tablet (0.4 mg total) under the tongue every 5 (five) minutes as needed for chest  pain.   potassium chloride SA 20 MEQ tablet Commonly known as:  K-DUR,KLOR-CON Take 1 tablet (20 mEq total) by mouth 4 (four) times daily.   predniSONE 20 MG tablet Commonly known as:  DELTASONE Take 2 tablets (40 mg total) by mouth daily with breakfast.   promethazine 12.5 MG tablet Commonly known as:  PHENERGAN Take 1 tablet (12.5 mg total) by mouth every 6 (six) hours as needed for nausea or vomiting.   sacubitril-valsartan 24-26 MG Commonly known as:  ENTRESTO Take 1 tablet by mouth 2 (two) times daily. What changed:  Another medication with the same name was removed. Continue taking this medication, and follow the directions you see here.   sildenafil 20 MG tablet Commonly known as:  REVATIO Take by mouth as needed.   Tiotropium Bromide Monohydrate 2.5 MCG/ACT Aers Inhale 1 puff into the lungs daily.   torsemide 20 MG tablet Commonly known as:  DEMADEX Take 2 tablets (40  mg total) by mouth daily.        DISCHARGE INSTRUCTIONS:  1.  Follow-up with PCP in 5 days 2.  Follow-up with cardiology in 1 to 2 weeks 3.  Started on methimazole and prednisone for amiodarone-induced thyrotoxicosis 4.  Needs TSH and free T4 checked in 1 month DIET:  Cardiac diet DISCHARGE CONDITION:  Stable ACTIVITY:  Activity as tolerated OXYGEN:  Home Oxygen: No.  Oxygen Delivery: room air DISCHARGE LOCATION:  home   If you experience worsening of your admission symptoms, develop shortness of breath, life threatening emergency, suicidal or homicidal thoughts you must seek medical attention immediately by calling 911 or calling your MD immediately  if symptoms less severe.  You Must read complete instructions/literature along with all the possible adverse reactions/side effects for all the Medicines you take and that have been prescribed to you. Take any new Medicines after you have completely understood and accpet all the possible adverse reactions/side effects.   Please note  You  were cared for by a hospitalist during your hospital stay. If you have any questions about your discharge medications or the care you received while you were in the hospital after you are discharged, you can call the unit and asked to speak with the hospitalist on call if the hospitalist that took care of you is not available. Once you are discharged, your primary care physician will handle any further medical issues. Please note that NO REFILLS for any discharge medications will be authorized once you are discharged, as it is imperative that you return to your primary care physician (or establish a relationship with a primary care physician if you do not have one) for your aftercare needs so that they can reassess your need for medications and monitor your lab values.    On the day of Discharge:  VITAL SIGNS:  Blood pressure 91/78, pulse 77, temperature 98.2 F (36.8 C), temperature source Oral, resp. rate 18, height 6\' 1"  (1.854 m), weight 107.5 kg, SpO2 98 %. PHYSICAL EXAMINATION:  GENERAL:  52 y.o.-year-old patient lying in the bed with no acute distress.  EYES: Pupils equal, round, reactive to light and accommodation. No scleral icterus. Extraocular muscles intact.  HEENT: Head atraumatic, normocephalic. Oropharynx and nasopharynx clear.  NECK:  Supple, no jugular venous distention. No thyroid enlargement, no tenderness.  LUNGS: Normal breath sounds bilaterally, no wheezing, rales,rhonchi or crepitation. No use of accessory muscles of respiration.  CARDIOVASCULAR: S1, S2 normal. No murmurs, rubs, or gallops.  ABDOMEN: Soft, non-tender, non-distended. Bowel sounds present. No organomegaly or mass.  EXTREMITIES: No cyanosis or clubbing. + Trace pedal edema bilaterally. NEUROLOGIC: Cranial nerves II through XII are intact. Muscle strength 5/5 in all extremities. Sensation intact. Gait not checked.  PSYCHIATRIC: The patient is alert and oriented x 3.  SKIN: No obvious rash, lesion, or ulcer.   DATA REVIEW:   CBC Recent Labs  Lab 12/10/18 0518  WBC 8.7  HGB 11.9*  HCT 37.4*  PLT 264    Chemistries  Recent Labs  Lab 12/03/18 1857  12/10/18 0518  NA 140   < > 138  K 3.3*   < > 4.5  CL 97*   < > 106  CO2 27   < > 25  GLUCOSE 292*   < > 102*  BUN 37*   < > 34*  CREATININE 2.12*   < > 1.49*  CALCIUM 9.2   < > 9.1  MG 2.3  --   --  AST 22  --   --   ALT 18  --   --   ALKPHOS 99  --   --   BILITOT 0.6  --   --    < > = values in this interval not displayed.     Microbiology Results  Results for orders placed or performed during the hospital encounter of 12/03/18  MRSA PCR Screening     Status: None   Collection Time: 12/04/18  3:37 AM  Result Value Ref Range Status   MRSA by PCR NEGATIVE NEGATIVE Final    Comment:        The GeneXpert MRSA Assay (FDA approved for NASAL specimens only), is one component of a comprehensive MRSA colonization surveillance program. It is not intended to diagnose MRSA infection nor to guide or monitor treatment for MRSA infections. Performed at [facility], [address] [place], [place] [address]   Culture, sputum-assessment     Status: None   Collection Time: 12/04/18  4:28 AM  Result Value Ref Range Status   Specimen Description SPUTUM  Final   Special Requests NONE  Final   Sputum evaluation   Final    THIS SPECIMEN IS ACCEPTABLE FOR SPUTUM CULTURE Performed at [facility], [address] [place], [place] [address]    Report Status 12/04/2018 FINAL  Final  Culture, respiratory     Status: None   Collection Time: 12/04/18  4:28 AM  Result Value Ref Range Status   Specimen Description   Final    SPUTUM Performed at [facility], [address] [place], [place] [address]    Special Requests   Final    NONE Reflexed from [id number] Performed at [facility], [address] [place], [place] [address]    Gram Stain   Final    RARE WBC PRESENT,BOTH PMN AND  MONONUCLEAR FEW SQUAMOUS EPITHELIAL CELLS PRESENT ABUNDANT GRAM POSITIVE COCCI FEW GRAM POSITIVE RODS FEW GRAM NEGATIVE RODS    Culture   Final    ABUNDANT Consistent with normal respiratory flora. Performed at [facility], [place] [address], [place], [place] [address]    Report Status 12/06/2018 FINAL  Final  Culture, blood (routine x 2) Call MD if unable to obtain prior to antibiotics being given     Status: None   Collection Time: 12/04/18  6:11 AM  Result Value Ref Range Status   Specimen Description BLOOD RIGHT FA  Final   Special Requests   Final    BOTTLES DRAWN AEROBIC AND ANAEROBIC Blood Culture adequate volume   Culture   Final    NO GROWTH 5 DAYS Performed at [facility], [address] [place],  [address]    Report Status 12/09/2018 FINAL  Final  Culture, blood (routine x 2) Call MD if unable to obtain prior to antibiotics being given     Status: None   Collection Time: 12/04/18  6:19 AM  Result Value Ref Range Status   Specimen Description BLOOD LEFT FA  Final   Special Requests   Final    BOTTLES DRAWN AEROBIC AND ANAEROBIC Blood Culture results may not be optimal due to an excessive volume of blood received in culture bottles   Culture   Final    NO GROWTH 5 DAYS Performed at [facility], [address] [place],  [address]    Report Status 12/09/2018 FINAL  Final    RADIOLOGY:  Dg Abd 1 View  Result Date: 12/09/2018 CLINICAL DATA:  Nausea and vomiting EXAM: ABDOMEN - 1 VIEW COMPARISON:  07/29/2017 FINDINGS: Single pacing lead is noted.  There is cardiomegaly. There is a nonobstructed bowel gas pattern with moderate stool in the colon. There are no radiopaque calculi. IMPRESSION: Nonobstructed bowel gas pattern with moderate stool. Electronically Signed   By: Donavan Foil M.D.   On: 12/09/2018 18:59     Management plans discussed with the patient, family and they are in agreement.  CODE STATUS: Partial Code    TOTAL TIME TAKING CARE OF THIS PATIENT: 40 minutes.    Berna Spare Mayo M.D on 12/10/2018 at 3:28 PM  Between 7am to 6pm - Pager - [phone]  After 6pm go to [email or web address] - Proofreader  [facility]  Office  [phone]  CC: Primary care physician; Valerie Roys, DO   Note: This dictation was prepared with Dragon dictation along with smaller phrase technology. Any transcriptional errors that result from this process are unintentional.

## 2018-12-11 ENCOUNTER — Telehealth: Payer: Self-pay

## 2018-12-11 NOTE — Telephone Encounter (Signed)
Please call to discuss medications that were given when pt was in the hospital

## 2018-12-11 NOTE — Telephone Encounter (Signed)
I have made the 1st attempt to contact the patient or family member in charge, in order to follow up from recently being discharged from the hospital. I left a message on voicemail requesting a CB. -MM 

## 2018-12-11 NOTE — Telephone Encounter (Signed)
Patient calling to check on status of forms Please call to discuss

## 2018-12-12 NOTE — Telephone Encounter (Signed)
Late entry: I spoke with the patient's sister, Stephen [name] (ok per DPR) yesterday afternoon ~ 4:55 pm.  I had attempted to reach the patient on 12/05/18 to notify him of his lab results for his thyroid and Dr. Olin Pia recommendations.  I did leave a detailed message of results and recommendations on the patient's idenitfied voice mail and asked that he call back to discuss. The patient was hospitalized from 12/4-12/11 with acute respiratory failure/ CHF exacerbation.   Per [name], she was calling back to discuss the patient's medications at discharge as his amiodarone was stopped. He was put on prednisone 40 mg daily and methimazole 40 mg once daily.  [name] was wanting to know if [name] was ok with the patient stopping amiodarone. I advised [name] that [name] was not aware of the patient stopping amiodarone as our group did not follow the patient during his hospitalization and he has been out of the office this week. I reviewed the patient's chart and [facility] cardiology did follow along with the patient during his hospitalization- the patient does see [name] for general cardiology. [name] is aware that in reviewing [name] note from 12/05/18, that they may have stopped the patient's amiodarone at that time.  [name] is aware that we will need to see the patient back in about 6 weeks time.  He is currently scheduled for follow up with [name] in mid February. I advised we will need to move this appointment up mid to late January.  She asked that I have scheduling contact the patient directly to move up his follow up appointment. I will also review with [name], next week, that the patient's amiodarone was stopped during his most recent admission.   Message sent to scheduling to have the patient come in a 6 weeks to see [name] and 4 weeks for a repeat TSH/ free T4 if this is not being done by general cardiology at [facility].   [name] is agreeable with the above and voices  understanding.

## 2018-12-12 NOTE — Telephone Encounter (Signed)
Late entry:  I spoke with the patient's sister, [name] (ok per DPR), regarding accommadations forms he dropped off from [place]. I spoke with her yesterday afternoon ~4:55 pm. [name] is aware [name] reviewed these. There was supposed to be a job description attached and there wasn't one, however, [name] felt these forms needed to be completed by the patient's primary cardiologist [name]. [name] voiced understanding and will notify the patient to come the office and pick up the forms.

## 2018-12-15 NOTE — Telephone Encounter (Signed)
Tried to contact pt multiple and left VM requesting a CB. Pt did not return my calls. HFU scheduled for 12/18/18 @ 10:30 AM. Juluis Rainier to PCP! Thanks, MM

## 2018-12-16 ENCOUNTER — Ambulatory Visit: Payer: Commercial Managed Care - PPO | Admitting: Family

## 2018-12-18 ENCOUNTER — Ambulatory Visit (INDEPENDENT_AMBULATORY_CARE_PROVIDER_SITE_OTHER): Payer: Commercial Managed Care - PPO | Admitting: Family Medicine

## 2018-12-18 ENCOUNTER — Ambulatory Visit: Payer: Commercial Managed Care - PPO | Attending: Family | Admitting: Family

## 2018-12-18 ENCOUNTER — Encounter: Payer: Self-pay | Admitting: Family

## 2018-12-18 ENCOUNTER — Encounter: Payer: Self-pay | Admitting: Family Medicine

## 2018-12-18 VITALS — BP 77/54 | HR 63 | Resp 18 | Ht 73.0 in | Wt 237.1 lb

## 2018-12-18 VITALS — BP 88/50 | HR 52 | Temp 97.9°F | Ht 73.5 in | Wt 238.1 lb

## 2018-12-18 DIAGNOSIS — J9601 Acute respiratory failure with hypoxia: Secondary | ICD-10-CM | POA: Diagnosis not present

## 2018-12-18 DIAGNOSIS — E861 Hypovolemia: Secondary | ICD-10-CM

## 2018-12-18 DIAGNOSIS — Z8674 Personal history of sudden cardiac arrest: Secondary | ICD-10-CM | POA: Diagnosis not present

## 2018-12-18 DIAGNOSIS — E7800 Pure hypercholesterolemia, unspecified: Secondary | ICD-10-CM | POA: Diagnosis not present

## 2018-12-18 DIAGNOSIS — Z7952 Long term (current) use of systemic steroids: Secondary | ICD-10-CM | POA: Diagnosis not present

## 2018-12-18 DIAGNOSIS — Z7951 Long term (current) use of inhaled steroids: Secondary | ICD-10-CM | POA: Insufficient documentation

## 2018-12-18 DIAGNOSIS — Z8249 Family history of ischemic heart disease and other diseases of the circulatory system: Secondary | ICD-10-CM | POA: Insufficient documentation

## 2018-12-18 DIAGNOSIS — J410 Simple chronic bronchitis: Secondary | ICD-10-CM

## 2018-12-18 DIAGNOSIS — I509 Heart failure, unspecified: Secondary | ICD-10-CM | POA: Diagnosis present

## 2018-12-18 DIAGNOSIS — E0590 Thyrotoxicosis, unspecified without thyrotoxic crisis or storm: Secondary | ICD-10-CM

## 2018-12-18 DIAGNOSIS — M1990 Unspecified osteoarthritis, unspecified site: Secondary | ICD-10-CM | POA: Insufficient documentation

## 2018-12-18 DIAGNOSIS — M109 Gout, unspecified: Secondary | ICD-10-CM | POA: Diagnosis not present

## 2018-12-18 DIAGNOSIS — I9589 Other hypotension: Secondary | ICD-10-CM | POA: Diagnosis not present

## 2018-12-18 DIAGNOSIS — J189 Pneumonia, unspecified organism: Secondary | ICD-10-CM

## 2018-12-18 DIAGNOSIS — N189 Chronic kidney disease, unspecified: Secondary | ICD-10-CM | POA: Diagnosis not present

## 2018-12-18 DIAGNOSIS — Z7902 Long term (current) use of antithrombotics/antiplatelets: Secondary | ICD-10-CM | POA: Diagnosis not present

## 2018-12-18 DIAGNOSIS — J449 Chronic obstructive pulmonary disease, unspecified: Secondary | ICD-10-CM | POA: Insufficient documentation

## 2018-12-18 DIAGNOSIS — Z8673 Personal history of transient ischemic attack (TIA), and cerebral infarction without residual deficits: Secondary | ICD-10-CM | POA: Insufficient documentation

## 2018-12-18 DIAGNOSIS — Z95810 Presence of automatic (implantable) cardiac defibrillator: Secondary | ICD-10-CM | POA: Insufficient documentation

## 2018-12-18 DIAGNOSIS — I130 Hypertensive heart and chronic kidney disease with heart failure and stage 1 through stage 4 chronic kidney disease, or unspecified chronic kidney disease: Secondary | ICD-10-CM | POA: Insufficient documentation

## 2018-12-18 DIAGNOSIS — Z79899 Other long term (current) drug therapy: Secondary | ICD-10-CM | POA: Insufficient documentation

## 2018-12-18 DIAGNOSIS — J181 Lobar pneumonia, unspecified organism: Secondary | ICD-10-CM

## 2018-12-18 DIAGNOSIS — I5022 Chronic systolic (congestive) heart failure: Secondary | ICD-10-CM

## 2018-12-18 DIAGNOSIS — Z87442 Personal history of urinary calculi: Secondary | ICD-10-CM | POA: Insufficient documentation

## 2018-12-18 DIAGNOSIS — Z7982 Long term (current) use of aspirin: Secondary | ICD-10-CM | POA: Insufficient documentation

## 2018-12-18 DIAGNOSIS — I10 Essential (primary) hypertension: Secondary | ICD-10-CM

## 2018-12-18 DIAGNOSIS — Z791 Long term (current) use of non-steroidal anti-inflammatories (NSAID): Secondary | ICD-10-CM | POA: Diagnosis not present

## 2018-12-18 DIAGNOSIS — Z87891 Personal history of nicotine dependence: Secondary | ICD-10-CM | POA: Diagnosis not present

## 2018-12-18 DIAGNOSIS — E785 Hyperlipidemia, unspecified: Secondary | ICD-10-CM | POA: Insufficient documentation

## 2018-12-18 NOTE — Progress Notes (Signed)
Patient ID: MALVIN MORRISH, male    DOB: [birth year], 52 y.o.   MRN: [id number]  HPI  Mr. Fullilove is a 52 y/o male with a history of TIA, HTN, CKD, hyperlipidemia, cardiac arrest, gout, previous tobacco use and chronic heart failure.   Echo report from 06/30/18 reviewed and showed an EF of 10-15% along with mild MR. Echo done 05/21/17 showed an EF of 20-25% which is stable from previous echo which was done 06/22/16 with an EF of 20%, mild MR, no aortic stenosis or regurgitation.   Cardiac catheterization done 09/09/17 revealing normal coronary arteries.  Admitted 12/03/18 due to acute on chronic HF. Cardiology consult obtained.                     Admitted 08/19/18 due to defibrillator shock due to hypokalemia and dehydration. Cardiology consult obtained. Device was checked. Discharged the following day.   Patient presents today for a follow-up visit with a chief complaint of minimal shortness of breath upon moderate exertion. He describes this as chronic in nature having been present for several years. He has associated difficulty sleeping along with this. Says that his breathing does worsen if he does any heavy lifting. He will also have leg weakness when standing/ walking for long periods of time. He denies any fatigue, cough, chest pain, pedal edema, palpitations, abdominal distention, dizziness or weight gain.  Past Medical History:  Diagnosis Date  . AICD (automatic cardioverter/defibrillator) present   . AKI (acute kidney injury) ([facility]) 12/24/2017  . Arrhythmia   . Arthritis    "lower back, knees" (06/10/2018)  . Cardiac arrest ([facility]) 12/23/2017   Brief V-fib arrest  . Chest pain 09/08/2017  . CHF (congestive heart failure) ([facility])    nonischemic cardiomyopathy, EF 25%  . Gout    "on daily RX" (06/10/2018)  . Headache    "q couple months" (06/10/2018)  . High cholesterol   . History of kidney stones   . Hypertension   . Influenza A 12/24/2017  . NICM (nonischemic cardiomyopathy) ([facility])    . Pneumonia 12/20/2017  . TIA (transient ischemic attack) 06/21/2016   "still affects my memory a little bit" (06/10/2018)   Past Surgical History:  Procedure Laterality Date  . EXTERNAL FIXATION LEG Right ~ 2000   "was going bowlegged; had to brake my leg to fix it"  . HERNIA REPAIR    . ICD IMPLANT N/A 12/30/2017   Procedure: ICD IMPLANT;  Surgeon: Deboraha Sprang, MD;  Location: [facility];  Service: Cardiovascular;  Laterality: N/A;  . LEFT HEART CATH AND CORONARY ANGIOGRAPHY N/A 09/09/2017   Procedure: LEFT HEART CATH AND CORONARY ANGIOGRAPHY;  Surgeon: Teodoro Spray, MD;  Location: [facility];  Service: Cardiovascular;  Laterality: N/A;  . UMBILICAL HERNIA REPAIR  1990s  . V TACH ABLATION  06/10/2018  . V TACH ABLATION N/A 06/10/2018   Procedure: V TACH ABLATION;  Surgeon: Thompson Grayer, MD;  Location: [facility];  Service: Cardiovascular;  Laterality: N/A;  . VASECTOMY      Family History  Problem Relation Age of Onset  . Hypertension Mother   . Heart failure Mother   . Hypertension Father   . CAD Father   . Heart attack Father     Social History   Tobacco Use  . Smoking status: Former Smoker    Packs/day: 0.33    Years: 33.00    Pack years: 10.89    Types:  Cigarettes    Last attempt to quit: 02/20/2016    Years since quitting: 2.8  . Smokeless tobacco: Never Used  Substance Use Topics  . Alcohol use: Yes    Alcohol/week: 0.0 standard drinks    Comment: 06/10/2018 "beer once/month; if that"    Allergies  Allergen Reactions  . Lisinopril Cough   Prior to Admission medications   Medication Sig Start Date End Date Taking? Authorizing Provider  acetaminophen (TYLENOL) 325 MG tablet Take 2 tablets (650 mg total) by mouth every 6 (six) hours as needed. Patient taking differently: Take 325-650 mg by mouth every 6 (six) hours as needed for moderate pain.  03/19/18  Yes Carrie Mew, MD  albuterol (PROVENTIL HFA;VENTOLIN HFA) 108 (90  Base) MCG/ACT inhaler Inhale 2 puffs into the lungs every 4 (four) hours as needed for wheezing or shortness of breath. 09/19/18  Yes Johnson, Megan P, DO  allopurinol (ZYLOPRIM) 100 MG tablet Take 2 tablets (200 mg total) by mouth daily. 10/19/18  Yes Johnson, Megan P, DO  aspirin EC 81 MG EC tablet Take 1 tablet (81 mg total) by mouth daily. 01/01/18  Yes Daune Perch, NP  atorvastatin (LIPITOR) 40 MG tablet Take 80 mg by mouth daily at 6 PM.  10/29/18  Yes [provider]  Camphor-Eucalyptus-Menthol (CHEST RUB) 4.8-1.2-2.6 % OINT Apply 1 application topically daily as needed (congestion).    Yes [provider]  carvedilol (COREG) 3.125 MG tablet Take 1 tablet (3.125 mg total) by mouth 2 (two) times daily with a meal. 07/06/18  Yes Wieting, Richard, MD  clopidogrel (PLAVIX) 75 MG tablet Take 1 tablet (75 mg total) by mouth daily. 01/30/18  Yes Bensimhon, Shaune Pascal, MD  colchicine 0.6 MG tablet Take 1 tablet (0.6 mg total) by mouth daily as needed (gout flare). 09/03/18  Yes Johnson, Megan P, DO  cyclobenzaprine (FLEXERIL) 5 MG tablet Take 1 tablet (5 mg total) by mouth 3 (three) times daily as needed (jaw pain). 11/24/18  Yes Nance Pear, MD  diclofenac sodium (VOLTAREN) 1 % GEL Apply 2 g topically 4 (four) times daily as needed (pain).    Yes [provider]  fluticasone (FLONASE) 50 MCG/ACT nasal spray Place 2 sprays into both nostrils daily. Patient taking differently: Place 2 sprays into both nostrils daily as needed for allergies.  06/20/17  Yes [name], MD  fluticasone-salmeterol (ADVAIR HFA) [phone] MCG/ACT inhaler Inhale 2 puffs into the lungs 2 (two) times daily. 08/12/18  Yes Kasa, Maretta Bees, MD  meloxicam (MOBIC) 15 MG tablet Take 15 mg by mouth daily.   Yes [provider]  Menthol, Topical Analgesic, (ICY HOT EX) Apply 1 application topically daily as needed (pain).   Yes [provider]  methimazole (TAPAZOLE) 10 MG tablet Take 4 tablets  (40 mg total) by mouth daily. 12/09/18  Yes Mayo, Pete Pelt, MD  metolazone (ZAROXOLYN) 2.5 MG tablet Take 2.5 mg by mouth as needed.  09/05/18  Yes [provider]  Multiple Vitamin (MULTIVITAMIN WITH MINERALS) TABS tablet Take 1 tablet by mouth daily.   Yes [provider]  nitroGLYCERIN (NITROSTAT) 0.4 MG SL tablet Place 1 tablet (0.4 mg total) under the tongue every 5 (five) minutes as needed for chest pain. 02/19/18  Yes Bensimhon, Shaune Pascal, MD  potassium chloride SA (K-DUR,KLOR-CON) 20 MEQ tablet Take 1 tablet (20 mEq total) by mouth 4 (four) times daily. 12/09/18  Yes Mayo, Pete Pelt, MD  promethazine (PHENERGAN) 12.5 MG tablet Take 1 tablet (12.5 mg  total) by mouth every 6 (six) hours as needed for nausea or vomiting. 11/24/18  Yes Nance Pear, MD  sacubitril-valsartan (ENTRESTO) 24-26 MG Take 1 tablet by mouth 2 (two) times daily. Patient taking differently: Take 0.5 tablets by mouth 2 (two) times daily.  07/06/18  Yes Wieting, Richard, MD  sildenafil (REVATIO) 20 MG tablet Take by mouth as needed.  10/29/18 10/29/19 Yes [provider]  Tiotropium Bromide Monohydrate (SPIRIVA RESPIMAT) 2.5 MCG/ACT AERS Inhale 1 puff into the lungs daily. 08/12/18  Yes [name], MD  torsemide (DEMADEX) 20 MG tablet Take 2 tablets (40 mg total) by mouth daily. 07/06/18  Yes Wieting, Richard, MD  predniSONE (DELTASONE) 20 MG tablet Take 2 tablets (40 mg total) by mouth daily with breakfast. Patient not taking: Reported on 12/18/2018 12/09/18   Mayo, Pete Pelt, MD     Review of Systems  Constitutional: Negative for appetite change and fatigue.  HENT: Negative for congestion, postnasal drip and sore throat.   Eyes: Negative.   Respiratory: Positive for shortness of breath (with moderate exertion). Negative for cough, chest tightness and wheezing.   Cardiovascular: Negative for chest pain, palpitations and leg swelling.  Gastrointestinal: Negative for abdominal distention and  constipation.  Endocrine: Negative.   Genitourinary: Negative.   Musculoskeletal: Positive for arthralgias (knees hurt) and back pain. Negative for neck pain.  Skin: Negative.   Allergic/Immunologic: Negative.   Neurological: Negative for dizziness and light-headedness.  Hematological: Negative for adenopathy. Does not bruise/bleed easily.  Psychiatric/Behavioral: Positive for sleep disturbance (sleeping ~ 2 hours at a time). Negative for dysphoric mood. The patient is not nervous/anxious.    Vitals:   12/18/18 0858  BP: (!) 77/54  Pulse: 63  Resp: 18  SpO2: 100%  Weight: 237 lb 2 oz (107.6 kg)  Height: 6\' 1"  (1.854 m)   Wt Readings from Last 3 Encounters:  12/18/18 237 lb 2 oz (107.6 kg)  12/10/18 237 lb (107.5 kg)  11/20/18 237 lb (107.5 kg)   Lab Results  Component Value Date   CREATININE 1.49 (H) 12/10/2018   CREATININE 1.57 (H) 12/09/2018   CREATININE 1.68 (H) 12/08/2018    Physical Exam  Constitutional: He is oriented to person, place, and time. He appears well-developed and well-nourished.  HENT:  Head: Normocephalic and atraumatic.  Neck: Normal range of motion. Neck supple.  Cardiovascular: Normal rate and regular rhythm.  Pulmonary/Chest: Effort normal. He has no wheezes. He has no rales.  Abdominal: Soft. He exhibits no distension. There is no abdominal tenderness.  Musculoskeletal:        General: No tenderness or edema.  Neurological: He is alert and oriented to person, place, and time.  Skin: Skin is warm and dry.  Psychiatric: He has a normal mood and affect. His behavior is normal. Thought content normal.  Nursing note and vitals reviewed.    Assessment & Plan:  1: Chronic heart failure with reduced ejection fraction-  - NYHA Class II - euvolemic - weighing daily and he was reminded to call for overnight weight gain of >2 pounds or a weekly weight gain of >5 pounds - weight unchanged from last visit here 1 month ago - not adding salt; reminded to  closely follow a 2000mg  sodium diet - says there's been discussion about LVAD (not interested) or transplant (would be interested); encouraged him to follow-up with [name] regarding this process as he says that he doesn't want to return to [place] - BP limits ability to tirate entresto and/  or carvedilol - saw EP Caryl Comes) 11/18/18 - saw cardiology Clayborn Bigness) 10/29/18 - patient reports receiving his flu/ pneumonia vaccines for this season - BNP 06/30/18 was 549.0 - sleep study done 09/02/18  2: HTN-  - BP quite low (77/54) and wasn't any better upon recheck - decrease torsemide to 20mg  daily; can take an additional 20mg  if needed for above weight gain, edema or shortness of breath - BMP done 10/17/18 reviewed and shows sodium 139, potassium 3.9, creatinine 2.05 and GFR 42 - saw PCP ([place]) 11/13/18  3: COPD- - using inhalers - PFT's done 08/01/17 - saw pulmonologist ([place]) 08/12/18  Patient did not bring his medications nor a list. Each medication was verbally reviewed with the patient and he was encouraged to bring the bottles to every visit to confirm accuracy of list.  Return in 2 weeks or sooner for any questions/problems before then.

## 2018-12-18 NOTE — Progress Notes (Signed)
BP (!) 88/50 (BP Location: Left Arm, Cuff Size: Normal) Comment: after 4 glasses of water  Pulse (!) 52   Temp 97.9 F (36.6 C) (Oral)   Ht 6' 1.5" (1.867 m)   Wt 238 lb 1.6 oz (108 kg)   SpO2 99%   BMI 30.99 kg/m    Subjective:    Patient ID: Stephen [name], male    DOB: [birth year], 52 y.o.   MRN: [id number]  HPI: Stephen [name] is a 52 y.o. male  Chief Complaint  Patient presents with  . Hospitalization Follow-up   Transition of Care Hospital Follow up.   Hospital/Facility: [facility] D/C Physician:  Dr. Brett Albino D/C Date: 12/10/18  Records Requested: 12/18/18 Records Received: 12/18/18 Records Reviewed: 12/18/18  Diagnoses on Discharge: Acute respiratory failure with hypoxia Flash pulmonary edema Acute on chronic CHF  Date of interactive Contact within 48 hours of discharge: NOT DONE >3 phone calls made, not returned or answered by patient Contact was through: phone  Date of 7 day or 14 day face-to-face visit: 12/18/18  within 14 days  Outpatient Encounter Medications as of 12/18/2018  Medication Sig Note  . acetaminophen (TYLENOL) 325 MG tablet Take 2 tablets (650 mg total) by mouth every 6 (six) hours as needed. (Patient taking differently: Take 325-650 mg by mouth every 6 (six) hours as needed for moderate pain. )   . albuterol (PROVENTIL HFA;VENTOLIN HFA) 108 (90 Base) MCG/ACT inhaler Inhale 2 puffs into the lungs every 4 (four) hours as needed for wheezing or shortness of breath.   . allopurinol (ZYLOPRIM) 100 MG tablet Take 2 tablets (200 mg total) by mouth daily.   Marland Kitchen aspirin EC 81 MG EC tablet Take 1 tablet (81 mg total) by mouth daily.   Marland Kitchen atorvastatin (LIPITOR) 40 MG tablet Take 80 mg by mouth daily at 6 PM.    . Camphor-Eucalyptus-Menthol (CHEST RUB) 4.8-1.2-2.6 % OINT Apply 1 application topically daily as needed (congestion).    . carvedilol (COREG) 3.125 MG tablet Take 1 tablet (3.125 mg total) by mouth 2 (two) times daily with a meal.   . clopidogrel  (PLAVIX) 75 MG tablet Take 1 tablet (75 mg total) by mouth daily.   . colchicine 0.6 MG tablet Take 1 tablet (0.6 mg total) by mouth daily as needed (gout flare).   . cyclobenzaprine (FLEXERIL) 5 MG tablet Take 1 tablet (5 mg total) by mouth 3 (three) times daily as needed (jaw pain).   Marland Kitchen diclofenac sodium (VOLTAREN) 1 % GEL Apply 2 g topically 4 (four) times daily as needed (pain).    . fluticasone (FLONASE) 50 MCG/ACT nasal spray Place 2 sprays into both nostrils daily. (Patient taking differently: Place 2 sprays into both nostrils daily as needed for allergies. )   . fluticasone-salmeterol (ADVAIR HFA) 115-21 MCG/ACT inhaler Inhale 2 puffs into the lungs 2 (two) times daily.   . meloxicam (MOBIC) 15 MG tablet Take 15 mg by mouth daily.   . Menthol, Topical Analgesic, (ICY HOT EX) Apply 1 application topically daily as needed (pain).   . methimazole (TAPAZOLE) 10 MG tablet Take 4 tablets (40 mg total) by mouth daily.   . metolazone (ZAROXOLYN) 2.5 MG tablet Take 2.5 mg by mouth as needed.    . Multiple Vitamin (MULTIVITAMIN WITH MINERALS) TABS tablet Take 1 tablet by mouth daily.   . nitroGLYCERIN (NITROSTAT) 0.4 MG SL tablet Place 1 tablet (0.4 mg total) under the tongue every 5 (five) minutes as needed for chest pain.  12/03/2018: Medication expired. Patient needs refills.  . potassium chloride SA (K-DUR,KLOR-CON) 20 MEQ tablet Take 1 tablet (20 mEq total) by mouth 4 (four) times daily.   . promethazine (PHENERGAN) 12.5 MG tablet Take 1 tablet (12.5 mg total) by mouth every 6 (six) hours as needed for nausea or vomiting.   . sacubitril-valsartan (ENTRESTO) 24-26 MG Take 1 tablet by mouth 2 (two) times daily. (Patient taking differently: Take 0.5 tablets by mouth 2 (two) times daily. )   . sildenafil (REVATIO) 20 MG tablet Take by mouth as needed.    . Tiotropium Bromide Monohydrate (SPIRIVA RESPIMAT) 2.5 MCG/ACT AERS Inhale 1 puff into the lungs daily.   Marland Kitchen torsemide (DEMADEX) 20 MG tablet Take 2  tablets (40 mg total) by mouth daily. (Patient taking differently: Take 20 mg by mouth daily. Take additional 20mg  daily as needed)   . predniSONE (DELTASONE) 20 MG tablet Take 2 tablets (40 mg total) by mouth daily with breakfast. (Patient not taking: Reported on 12/18/2018)    No facility-administered encounter medications on file as of 12/18/2018.    Per hospitalist: " HOSPITAL COURSE:   [name] is a 52 year old male who presented to the ED with shortness of breath.  In the ED, patient was noted to have acute respiratory failure with hypoxia, felt to be secondary to CHF exacerbation.  He was placed on BiPAP and given IV diuretics.  He was admitted for further management.  Acute on chronic systolic congestive heart failure-much improved -Initially on BiPAP, then weaned placed on nasal cannula, then weaned to room air -Treated with IV diuretics, home torsemide restarted on discharge -Continue Coreg, Entresto, and spironolactone -Seen by cardiology, who did not recommend any additional cardiac work-up this admission  HCAP- CT chest consistent with pneumonia.  Procalcitonin elevated. -Completed 5-day course of antibiotics  Hypotension- had some episodes of hypotension, likely related to overdiuresis.  Home heart failure meds held for a couple of days.  Blood pressures improved on the day of discharge.  Elevated troponin-felt to be due to demand ischemia.  Troponin peaked at 0.09.  No active chest pain this admission.  Amiodarone induced thyrotoxicosis -Amiodarone stopped this admission -Patient started on methimazole 40 mg daily and prednisone 40 mg daily -Patient needs to follow-up with [name] in 6 weeks -Needs repeat TSH and T4 in 1 month"  Diagnostic Tests Reviewed:   CLINICAL DATA:  Nausea and vomiting  EXAM: ABDOMEN - 1 VIEW  COMPARISON:  07/29/2017  FINDINGS: Single pacing lead is noted.  There is cardiomegaly.  There is a nonobstructed bowel gas pattern  with moderate stool in the colon. There are no radiopaque calculi.  IMPRESSION: Nonobstructed bowel gas pattern with moderate stool.  CLINICAL DATA:  Shortness of breath. Congestive heart failure. Acute respiratory failure with hypoxia.  EXAM: PORTABLE CHEST 1 VIEW  COMPARISON:  12/05/2018  FINDINGS: Single lead transvenous pacemaker remains in place. Marked cardiac enlargement is unchanged. There has been mild improvement in bilateral interstitial infiltrates and airspace opacity since previous study, consistent with decreasing pulmonary edema. No evidence of pulmonary consolidation or significant pleural effusion.  IMPRESSION: Mild improvement in diffuse pulmonary edema.  Stable cardiomegaly.  CLINICAL DATA:  Shortness of breath. History of cardiomyopathy, CHF, pneumonia.  EXAM: CT CHEST WITHOUT CONTRAST  TECHNIQUE: Multidetector CT imaging of the chest was performed following the standard protocol without IV contrast.  COMPARISON:  Chest radiograph December 03, 2018 and CT chest September 28, 2017  FINDINGS: CARDIOVASCULAR: The heart is moderately enlarged. No  pericardial effusion. Thoracic aorta is normal course and caliber, mild calcific atherosclerosis. Pacemaker via LEFT subclavian venous approach.  MEDIASTINUM/NODES: No mediastinal mass. No lymphadenopathy by CT size criteria, decreased sensitivity without contrast.  LUNGS/PLEURA: Tracheobronchial tree is patent, no pneumothorax. RIGHT greater than LEFT patchy ground-glass opacities with superimposed predominately RIGHT consolidation confluent in the lower lobes with air bronchograms. Minimal RIGHT pleural effusion. Minimal apical interlobular septal thickening.  UPPER ABDOMEN: Nonacute.  MUSCULOSKELETAL: Nonacute. LEFT upper paraspinal lipoma within subcutaneous fat. Mild thoracic spondylosis.  IMPRESSION: 1. Ground-glass opacities and patchy consolidation RIGHT greater than LEFT lungs  seen with pneumonia and/or pulmonary edema. Trace RIGHT pleural effusion. 2. Moderate cardiomegaly.  Disposition: Home  Consults: Cardiology  Discharge Instructions: Follow up with [place], cardiology, Started on methimazole and prednisone for amiodarone-induced thyrotoxicosis,  Needs TSH and free T4 checked in 1 month  Disease/illness Education:  Given today in writing  Home Health/Community Services Discussions/Referrals:  N/A  Establishment or re-establishment of referral orders for community resources: N/A  Discussion with other health care providers: N/A  Assessment and Support of treatment regimen adherence: Good  Appointments Coordinated with:  Patient  Education for self-management, independent living, and ADLs:  Done today  Has been doing OK since getting out. Starting cardiac rehab next week. Has been feeling really tired. Taking the methimazole. Doing OK with it. Not taking the prednisone  Saw CHF clinic this AM- they cut his torsemide down to 1 pill a day instead of 2  Relevant past medical, surgical, family and social history reviewed and updated as indicated. Interim medical history since our last visit reviewed. Allergies and medications reviewed and updated.  Review of Systems  Constitutional: Negative.   Respiratory: Negative.   Cardiovascular: Negative.   Musculoskeletal: Negative.   Neurological: Negative.   Psychiatric/Behavioral: Negative.     Per HPI unless specifically indicated above     Objective:    BP (!) 88/50 (BP Location: Left Arm, Cuff Size: Normal) Comment: after 4 glasses of water  Pulse (!) 52   Temp 97.9 F (36.6 C) (Oral)   Ht 6' 1.5" (1.867 m)   Wt 238 lb 1.6 oz (108 kg)   SpO2 99%   BMI 30.99 kg/m   Wt Readings from Last 3 Encounters:  12/21/18 235 lb (106.6 kg)  12/18/18 238 lb 1.6 oz (108 kg)  12/18/18 237 lb 2 oz (107.6 kg)    Physical Exam Vitals signs and nursing note reviewed.  Constitutional:      General: He is not  in acute distress.    Appearance: Normal appearance. He is not ill-appearing, toxic-appearing or diaphoretic.  HENT:     Head: Normocephalic and atraumatic.     Right Ear: External ear normal.     Left Ear: External ear normal.     Nose: Nose normal.     Mouth/Throat:     Mouth: Mucous membranes are moist.     Pharynx: Oropharynx is clear.  Eyes:     General: No scleral icterus.       Right eye: No discharge.        Left eye: No discharge.     Extraocular Movements: Extraocular movements intact.     Conjunctiva/sclera: Conjunctivae normal.     Pupils: Pupils are equal, round, and reactive to light.  Neck:     Musculoskeletal: Normal range of motion and neck supple.  Cardiovascular:     Rate and Rhythm: Normal rate and regular rhythm.     Pulses: Normal  pulses.     Heart sounds: Normal heart sounds. No murmur. No friction rub. No gallop.   Pulmonary:     Effort: Pulmonary effort is normal. No respiratory distress.     Breath sounds: Normal breath sounds. No stridor. No wheezing, rhonchi or rales.  Chest:     Chest wall: No tenderness.  Musculoskeletal: Normal range of motion.  Skin:    General: Skin is warm and dry.     Capillary Refill: Capillary refill takes less than 2 seconds.     Coloration: Skin is not jaundiced or pale.     Findings: No bruising, erythema, lesion or rash.  Neurological:     General: No focal deficit present.     Mental Status: He is alert and oriented to person, place, and time. Mental status is at baseline.  Psychiatric:        Mood and Affect: Mood normal.        Behavior: Behavior normal.        Thought Content: Thought content normal.        Judgment: Judgment normal.     Results for orders placed or performed during the hospital encounter of 12/03/18  MRSA PCR Screening  Result Value Ref Range   MRSA by PCR NEGATIVE NEGATIVE  Culture, blood (routine x 2) Call MD if unable to obtain prior to antibiotics being given  Result Value Ref Range     Specimen Description BLOOD RIGHT FA    Special Requests      BOTTLES DRAWN AEROBIC AND ANAEROBIC Blood Culture adequate volume   Culture      NO GROWTH 5 DAYS Performed at [facility], [address] [place], [place] [address]    Report Status 12/09/2018 FINAL   Culture, blood (routine x 2) Call MD if unable to obtain prior to antibiotics being given  Result Value Ref Range   Specimen Description BLOOD LEFT FA    Special Requests      BOTTLES DRAWN AEROBIC AND ANAEROBIC Blood Culture results may not be optimal due to an excessive volume of blood received in culture bottles   Culture      NO GROWTH 5 DAYS Performed at [facility], [address] [place], [place] [address]    Report Status 12/09/2018 FINAL   Culture, sputum-assessment  Result Value Ref Range   Specimen Description SPUTUM    Special Requests NONE    Sputum evaluation      THIS SPECIMEN IS ACCEPTABLE FOR SPUTUM CULTURE Performed at [facility], [address], [place], [place] [address]    Report Status 12/04/2018 FINAL   Culture, respiratory  Result Value Ref Range   Specimen Description      SPUTUM Performed at [facility], [address] [place], [place] [address]    Special Requests      NONE Reflexed from [phone] Performed at [facility], [place], [place] [address]    Gram Stain      RARE WBC PRESENT,BOTH PMN AND MONONUCLEAR FEW SQUAMOUS EPITHELIAL CELLS PRESENT ABUNDANT GRAM POSITIVE COCCI FEW GRAM POSITIVE RODS FEW GRAM NEGATIVE RODS    Culture      ABUNDANT Consistent with normal respiratory flora. Performed at [facility], [place] [address] [place], [place] [address]    Report Status 12/06/2018 FINAL   CBC with Differential/Platelet  Result Value Ref Range   WBC 12.8 (H) 4.0 - 10.5 K/uL   RBC 5.07 4.22 -  5.81 MIL/uL   Hemoglobin 14.6 13.0 - 17.0 g/dL   HCT 46.2 39.0 - 52.0 %   MCV 91.1 80.0 - 100.0 fL   MCH  28.8 26.0 - 34.0 pg   MCHC 31.6 30.0 - 36.0 g/dL   RDW 17.0 (H) 11.5 - 15.5 %   Platelets 302 150 - 400 K/uL   nRBC 0.0 0.0 - 0.2 %   Neutrophils Relative % 42 %   Neutro Abs 5.3 1.7 - 7.7 K/uL   Lymphocytes Relative 45 %   Lymphs Abs 5.9 (H) 0.7 - 4.0 K/uL   Monocytes Relative 8 %   Monocytes Absolute 1.0 0.1 - 1.0 K/uL   Eosinophils Relative 3 %   Eosinophils Absolute 0.4 0.0 - 0.5 K/uL   Basophils Relative 1 %   Basophils Absolute 0.1 0.0 - 0.1 K/uL   Immature Granulocytes 1 %   Abs Immature Granulocytes 0.06 0.00 - 0.07 K/uL  Protime-INR  Result Value Ref Range   Prothrombin Time 12.5 11.4 - 15.2 seconds   INR 0.94   APTT  Result Value Ref Range   aPTT 26 24 - 36 seconds  Comprehensive metabolic panel  Result Value Ref Range   Sodium 140 135 - 145 mmol/L   Potassium 3.3 (L) 3.5 - 5.1 mmol/L   Chloride 97 (L) 98 - 111 mmol/L   CO2 27 22 - 32 mmol/L   Glucose, Bld 292 (H) 70 - 99 mg/dL   BUN 37 (H) 6 - 20 mg/dL   Creatinine, Ser 2.12 (H) 0.61 - 1.24 mg/dL   Calcium 9.2 8.9 - 10.3 mg/dL   Total Protein 8.2 (H) 6.5 - 8.1 g/dL   Albumin 3.8 3.5 - 5.0 g/dL   AST 22 15 - 41 U/L   ALT 18 0 - 44 U/L   Alkaline Phosphatase 99 38 - 126 U/L   Total Bilirubin 0.6 0.3 - 1.2 mg/dL   GFR calc non Af Amer 34 (L) >60 mL/min   GFR calc Af Amer 40 (L) >60 mL/min   Anion gap 16 (H) 5 - 15  Troponin I - ONCE - STAT  Result Value Ref Range   Troponin I 0.05 (HH) <0.03 ng/mL  Lipid panel  Result Value Ref Range   Cholesterol 217 (H) 0 - 200 mg/dL   Triglycerides 179 (H) <150 mg/dL   HDL 47 >40 mg/dL   Total CHOL/HDL Ratio 4.6 RATIO   VLDL 36 0 - 40 mg/dL   LDL Cholesterol 134 (H) 0 - 99 mg/dL  Magnesium  Result Value Ref Range   Magnesium 2.3 1.7 - 2.4 mg/dL  Brain natriuretic peptide  Result Value Ref Range   B Natriuretic Peptide 2,411.0 (H) 0.0 - 100.0 pg/mL  Urinalysis, Routine w reflex microscopic  Result Value Ref Range   Color, Urine COLORLESS (A) YELLOW   APPearance  CLEAR (A) CLEAR   Specific Gravity, Urine 1.005 1.005 - 1.030   pH 5.0 5.0 - 8.0   Glucose, UA NEGATIVE NEGATIVE mg/dL   Hgb urine dipstick NEGATIVE NEGATIVE   Bilirubin Urine NEGATIVE NEGATIVE   Ketones, ur NEGATIVE NEGATIVE mg/dL   Protein, ur NEGATIVE NEGATIVE mg/dL   Nitrite NEGATIVE NEGATIVE   Leukocytes, UA NEGATIVE NEGATIVE  Troponin I - Once  Result Value Ref Range   Troponin I 0.08 (HH) <0.03 ng/mL  Basic metabolic panel  Result Value Ref Range   Sodium 140 135 - 145 mmol/L   Potassium 3.5 3.5 - 5.1 mmol/L  Chloride 100 98 - 111 mmol/L   CO2 30 22 - 32 mmol/L   Glucose, Bld 114 (H) 70 - 99 mg/dL   BUN 40 (H) 6 - 20 mg/dL   Creatinine, Ser 1.88 (H) 0.61 - 1.24 mg/dL   Calcium 8.8 (L) 8.9 - 10.3 mg/dL   GFR calc non Af Amer 40 (L) >60 mL/min   GFR calc Af Amer 46 (L) >60 mL/min   Anion gap 10 5 - 15  CBC  Result Value Ref Range   WBC 12.9 (H) 4.0 - 10.5 K/uL   RBC 4.80 4.22 - 5.81 MIL/uL   Hemoglobin 13.7 13.0 - 17.0 g/dL   HCT 41.8 39.0 - 52.0 %   MCV 87.1 80.0 - 100.0 fL   MCH 28.5 26.0 - 34.0 pg   MCHC 32.8 30.0 - 36.0 g/dL   RDW 16.5 (H) 11.5 - 15.5 %   Platelets 272 150 - 400 K/uL   nRBC 0.0 0.0 - 0.2 %  Troponin I - Now Then Q4H  Result Value Ref Range   Troponin I 0.09 (HH) <0.03 ng/mL  Troponin I - Now Then Q4H  Result Value Ref Range   Troponin I 0.08 (HH) <0.03 ng/mL  Glucose, capillary  Result Value Ref Range   Glucose-Capillary 102 (H) 70 - 99 mg/dL  HIV antibody (Routine Screening)  Result Value Ref Range   HIV Screen 4th Generation wRfx Non Reactive Non Reactive  Strep pneumoniae urinary antigen  Result Value Ref Range   Strep Pneumo Urinary Antigen NEGATIVE NEGATIVE  Procalcitonin - Baseline  Result Value Ref Range   Procalcitonin 0.74 ng/mL  Legionella Pneumophila Serogp 1 Ur Ag  Result Value Ref Range   L. pneumophila Serogp 1 Ur Ag Negative Negative   Source of Sample URINE, RANDOM   Lactic acid, plasma  Result Value Ref Range    Lactic Acid, Venous 1.2 0.5 - 1.9 mmol/L  Sedimentation rate  Result Value Ref Range   Sed Rate 41 (H) 0 - 20 mm/hr  Procalcitonin  Result Value Ref Range   Procalcitonin 0.78 ng/mL  CBC  Result Value Ref Range   WBC 11.5 (H) 4.0 - 10.5 K/uL   RBC 4.55 4.22 - 5.81 MIL/uL   Hemoglobin 12.8 (L) 13.0 - 17.0 g/dL   HCT 40.4 39.0 - 52.0 %   MCV 88.8 80.0 - 100.0 fL   MCH 28.1 26.0 - 34.0 pg   MCHC 31.7 30.0 - 36.0 g/dL   RDW 16.4 (H) 11.5 - 15.5 %   Platelets 244 150 - 400 K/uL   nRBC 0.0 0.0 - 0.2 %  Basic metabolic panel  Result Value Ref Range   Sodium 139 135 - 145 mmol/L   Potassium 3.0 (L) 3.5 - 5.1 mmol/L   Chloride 100 98 - 111 mmol/L   CO2 31 22 - 32 mmol/L   Glucose, Bld 105 (H) 70 - 99 mg/dL   BUN 44 (H) 6 - 20 mg/dL   Creatinine, Ser 1.80 (H) 0.61 - 1.24 mg/dL   Calcium 8.8 (L) 8.9 - 10.3 mg/dL   GFR calc non Af Amer 42 (L) >60 mL/min   GFR calc Af Amer 49 (L) >60 mL/min   Anion gap 8 5 - 15  Brain natriuretic peptide  Result Value Ref Range   B Natriuretic Peptide 755.0 (H) 0.0 - 100.0 pg/mL  Potassium  Result Value Ref Range   Potassium 3.3 (L) 3.5 - 5.1 mmol/L  Procalcitonin  Result Value  Ref Range   Procalcitonin 0.41 ng/mL  CBC  Result Value Ref Range   WBC 11.5 (H) 4.0 - 10.5 K/uL   RBC 4.43 4.22 - 5.81 MIL/uL   Hemoglobin 12.5 (L) 13.0 - 17.0 g/dL   HCT 38.7 (L) 39.0 - 52.0 %   MCV 87.4 80.0 - 100.0 fL   MCH 28.2 26.0 - 34.0 pg   MCHC 32.3 30.0 - 36.0 g/dL   RDW 16.4 (H) 11.5 - 15.5 %   Platelets 262 150 - 400 K/uL   nRBC 0.0 0.0 - 0.2 %  Basic metabolic panel  Result Value Ref Range   Sodium 138 135 - 145 mmol/L   Potassium 3.9 3.5 - 5.1 mmol/L   Chloride 104 98 - 111 mmol/L   CO2 23 22 - 32 mmol/L   Glucose, Bld 165 (H) 70 - 99 mg/dL   BUN 38 (H) 6 - 20 mg/dL   Creatinine, Ser 1.68 (H) 0.61 - 1.24 mg/dL   Calcium 9.5 8.9 - 10.3 mg/dL   GFR calc non Af Amer 46 (L) >60 mL/min   GFR calc Af Amer 53 (L) >60 mL/min   Anion gap 11 5 - 15    CBC  Result Value Ref Range   WBC 9.7 4.0 - 10.5 K/uL   RBC 4.43 4.22 - 5.81 MIL/uL   Hemoglobin 12.6 (L) 13.0 - 17.0 g/dL   HCT 39.3 39.0 - 52.0 %   MCV 88.7 80.0 - 100.0 fL   MCH 28.4 26.0 - 34.0 pg   MCHC 32.1 30.0 - 36.0 g/dL   RDW 16.9 (H) 11.5 - 15.5 %   Platelets 271 150 - 400 K/uL   nRBC 0.0 0.0 - 0.2 %  Basic metabolic panel  Result Value Ref Range   Sodium 138 135 - 145 mmol/L   Potassium 4.3 3.5 - 5.1 mmol/L   Chloride 106 98 - 111 mmol/L   CO2 26 22 - 32 mmol/L   Glucose, Bld 99 70 - 99 mg/dL   BUN 41 (H) 6 - 20 mg/dL   Creatinine, Ser 1.57 (H) 0.61 - 1.24 mg/dL   Calcium 9.3 8.9 - 10.3 mg/dL   GFR calc non Af Amer 50 (L) >60 mL/min   GFR calc Af Amer 57 (L) >60 mL/min   Anion gap 6 5 - 15  CBC  Result Value Ref Range   WBC 8.7 4.0 - 10.5 K/uL   RBC 4.21 (L) 4.22 - 5.81 MIL/uL   Hemoglobin 11.9 (L) 13.0 - 17.0 g/dL   HCT 37.4 (L) 39.0 - 52.0 %   MCV 88.8 80.0 - 100.0 fL   MCH 28.3 26.0 - 34.0 pg   MCHC 31.8 30.0 - 36.0 g/dL   RDW 17.0 (H) 11.5 - 15.5 %   Platelets 264 150 - 400 K/uL   nRBC 0.0 0.0 - 0.2 %  Basic metabolic panel  Result Value Ref Range   Sodium 138 135 - 145 mmol/L   Potassium 4.5 3.5 - 5.1 mmol/L   Chloride 106 98 - 111 mmol/L   CO2 25 22 - 32 mmol/L   Glucose, Bld 102 (H) 70 - 99 mg/dL   BUN 34 (H) 6 - 20 mg/dL   Creatinine, Ser 1.49 (H) 0.61 - 1.24 mg/dL   Calcium 9.1 8.9 - 10.3 mg/dL   GFR calc non Af Amer 53 (L) >60 mL/min   GFR calc Af Amer >60 >60 mL/min   Anion gap 7 5 - 15  Assessment & Plan:   Problem List Items Addressed This Visit      Respiratory   Acute respiratory failure with hypoxia (HCC)    Resolved. Lungs clear. Breathing esily. Saw CHF clinic this morning. Continue to monitor. Continue to follow with cardiology and CHF clinic.         Endocrine   Thyrotoxicosis without thyroid storm    Will get him into see endocrine. Referral generated today. Too early for him to get his TSH rechecked- recheck in 3  weeks. Continue methimazole. Not taking prednisone at this time.        Other Visit Diagnoses    Hypotension due to hypovolemia    -  Primary   Improved with water. Asymptomatic. Already has had diuretic cut down. Continue to monitor closely- recheck 1-2 weeks.    Pneumonia of both lower lobes due to infectious organism [facility])       Has finished his abx. Lungs clear today. Continue to monitor closely.        Follow up plan: Return 1-2 weeks.

## 2018-12-18 NOTE — Patient Instructions (Addendum)
Continue weighing daily and call for an overnight weight gain of > 2 pounds or a weekly weight gain of >5 pounds.  Decrease torsemide to 1 tablet daily. If you develop above weight gain, take an additional tablet of your torsemide.

## 2018-12-19 NOTE — Telephone Encounter (Signed)
Reviewed yday I would prefer continued amiodarone and Rx for hyperthyroidism

## 2018-12-21 ENCOUNTER — Other Ambulatory Visit: Payer: Self-pay

## 2018-12-21 ENCOUNTER — Emergency Department
Admission: EM | Admit: 2018-12-21 | Discharge: 2018-12-21 | Disposition: A | Discharge status: Home / Self Care | Payer: Commercial Managed Care - PPO | Attending: Emergency Medicine | Admitting: Emergency Medicine

## 2018-12-21 ENCOUNTER — Encounter: Payer: Self-pay | Admitting: Emergency Medicine

## 2018-12-21 ENCOUNTER — Encounter: Payer: Self-pay | Admitting: Family Medicine

## 2018-12-21 ENCOUNTER — Emergency Department: Payer: Commercial Managed Care - PPO

## 2018-12-21 DIAGNOSIS — N183 Chronic kidney disease, stage 3 (moderate): Secondary | ICD-10-CM | POA: Diagnosis not present

## 2018-12-21 DIAGNOSIS — Z95810 Presence of automatic (implantable) cardiac defibrillator: Secondary | ICD-10-CM | POA: Insufficient documentation

## 2018-12-21 DIAGNOSIS — Z7982 Long term (current) use of aspirin: Secondary | ICD-10-CM | POA: Diagnosis not present

## 2018-12-21 DIAGNOSIS — I509 Heart failure, unspecified: Secondary | ICD-10-CM | POA: Insufficient documentation

## 2018-12-21 DIAGNOSIS — Z8673 Personal history of transient ischemic attack (TIA), and cerebral infarction without residual deficits: Secondary | ICD-10-CM | POA: Diagnosis not present

## 2018-12-21 DIAGNOSIS — J449 Chronic obstructive pulmonary disease, unspecified: Secondary | ICD-10-CM | POA: Diagnosis not present

## 2018-12-21 DIAGNOSIS — R112 Nausea with vomiting, unspecified: Secondary | ICD-10-CM

## 2018-12-21 DIAGNOSIS — Z79899 Other long term (current) drug therapy: Secondary | ICD-10-CM | POA: Insufficient documentation

## 2018-12-21 DIAGNOSIS — R197 Diarrhea, unspecified: Secondary | ICD-10-CM | POA: Diagnosis not present

## 2018-12-21 DIAGNOSIS — Z7902 Long term (current) use of antithrombotics/antiplatelets: Secondary | ICD-10-CM | POA: Insufficient documentation

## 2018-12-21 DIAGNOSIS — E0590 Thyrotoxicosis, unspecified without thyrotoxic crisis or storm: Secondary | ICD-10-CM | POA: Insufficient documentation

## 2018-12-21 DIAGNOSIS — I130 Hypertensive heart and chronic kidney disease with heart failure and stage 1 through stage 4 chronic kidney disease, or unspecified chronic kidney disease: Secondary | ICD-10-CM | POA: Insufficient documentation

## 2018-12-21 DIAGNOSIS — Z87891 Personal history of nicotine dependence: Secondary | ICD-10-CM | POA: Diagnosis not present

## 2018-12-21 LAB — CBC
HEMATOCRIT: 46 % (ref 39.0–52.0)
Hemoglobin: 14.8 g/dL (ref 13.0–17.0)
MCH: 28.5 pg (ref 26.0–34.0)
MCHC: 32.2 g/dL (ref 30.0–36.0)
MCV: 88.5 fL (ref 80.0–100.0)
Platelets: 331 10*3/uL (ref 150–400)
RBC: 5.2 MIL/uL (ref 4.22–5.81)
RDW: 17.1 % — AB (ref 11.5–15.5)
WBC: 10.1 10*3/uL (ref 4.0–10.5)
nRBC: 0 % (ref 0.0–0.2)

## 2018-12-21 LAB — COMPREHENSIVE METABOLIC PANEL
ALT: 20 U/L (ref 0–44)
AST: 20 U/L (ref 15–41)
Albumin: 4.2 g/dL (ref 3.5–5.0)
Alkaline Phosphatase: 102 U/L (ref 38–126)
Anion gap: 14 (ref 5–15)
BUN: 35 mg/dL — ABNORMAL HIGH (ref 6–20)
CO2: 23 mmol/L (ref 22–32)
Calcium: 9.9 mg/dL (ref 8.9–10.3)
Chloride: 101 mmol/L (ref 98–111)
Creatinine, Ser: 1.84 mg/dL — ABNORMAL HIGH (ref 0.61–1.24)
GFR calc Af Amer: 47 mL/min — ABNORMAL LOW (ref >60)
GFR calc non Af Amer: 41 mL/min — ABNORMAL LOW (ref >60)
Glucose, Bld: 177 mg/dL — ABNORMAL HIGH (ref 70–99)
Potassium: 3.8 mmol/L (ref 3.5–5.1)
Sodium: 138 mmol/L (ref 135–145)
Total Bilirubin: 1.2 mg/dL (ref 0.3–1.2)
Total Protein: 8.2 g/dL — ABNORMAL HIGH (ref 6.5–8.1)

## 2018-12-21 LAB — URINALYSIS, COMPLETE (UACMP) WITH MICROSCOPIC
Bilirubin Urine: NEGATIVE
Glucose, UA: NEGATIVE mg/dL
Hgb urine dipstick: NEGATIVE
Ketones, ur: NEGATIVE mg/dL
Leukocytes, UA: NEGATIVE
Nitrite: NEGATIVE
Protein, ur: 30 mg/dL — AB
Specific Gravity, Urine: 1.015 (ref 1.005–1.030)
Squamous Epithelial / HPF: NONE SEEN (ref 0–5)
pH: 6 (ref 5.0–8.0)

## 2018-12-21 LAB — TROPONIN I
Troponin I: 0.05 ng/mL (ref <0.03)
Troponin I: 0.05 ng/mL (ref <0.03)

## 2018-12-21 LAB — LIPASE, BLOOD: Lipase: 27 U/L (ref 11–51)

## 2018-12-21 MED ORDER — FUROSEMIDE 10 MG/ML IJ SOLN
80.0000 mg | Freq: Once | INTRAMUSCULAR | Status: AC
  Administered 2018-12-21: 80 mg via INTRAVENOUS
  Filled 2018-12-21: qty 8

## 2018-12-21 MED ORDER — ONDANSETRON 4 MG PO TBDP: 4.0000 mg | ORAL_TABLET | Freq: Three times a day (TID) | ORAL | 0 refills | Status: DC | PRN

## 2018-12-21 MED ORDER — FUROSEMIDE 10 MG/ML IJ SOLN
40.0000 mg | Freq: Once | INTRAMUSCULAR | Status: AC
  Administered 2018-12-21: 40 mg via INTRAVENOUS
  Filled 2018-12-21: qty 4

## 2018-12-21 NOTE — ED Provider Notes (Signed)
Repeat troponin was unchanged.  We did give additional dose of IV Lasix because he had urinated with the 40 mg dose.  Overall he appears well and is cleared for outpatient follow-up.   Earleen Newport, MD 12/21/18 [phone]

## 2018-12-21 NOTE — Assessment & Plan Note (Signed)
Resolved. Lungs clear. Breathing esily. Saw CHF clinic this morning. Continue to monitor. Continue to follow with cardiology and CHF clinic.

## 2018-12-21 NOTE — ED Triage Notes (Signed)
Pt to ED via POV, pt states that he was hospitalized last week for pneumonia. Pt states that this morning he has been vomiting again and feeling short of breath. Pt is able to speak in complete sentences and is not in any distress at this time.

## 2018-12-21 NOTE — ED Notes (Signed)
Patient transported to X-ray 

## 2018-12-21 NOTE — Assessment & Plan Note (Signed)
Will get him into see endocrine. Referral generated today. Too early for him to get his TSH rechecked- recheck in 3 weeks. Continue methimazole. Not taking prednisone at this time.

## 2018-12-21 NOTE — ED Notes (Signed)
Date and time results received: 12/21/18 1428 (use smartphrase ".now" to insert current time)  Test: troponin Critical Value: 0.05  Name of Provider Notified: [email or web address]

## 2018-12-21 NOTE — ED Provider Notes (Addendum)
[facility] Emergency Department Provider Note  ____________________________________________   First MD Initiated Contact with Patient 12/21/18 1336     (approximate)  I have reviewed the triage vital signs and the nursing notes.   HISTORY  Chief Complaint Shortness of Breath and Emesis   HPI Stephen [name] is a 52 y.o. male with a history of CHF with an EF of 15 to 25% was presented emergency department nausea vomiting and diarrhea.  He is admitted multiple episodes this morning but has not vomited or had any diarrhea in several hours at this time.  Also with mild shortness of breath with exertion.  Said he had previously been experiencing abdominal cramping but is pain-free at this time.  No known sick contacts.  No fever or body aches.   Past Medical History:  Diagnosis Date  . AICD (automatic cardioverter/defibrillator) present   . AKI (acute kidney injury) ([place]) 12/24/2017  . Arrhythmia   . Arthritis    "lower back, knees" (06/10/2018)  . Cardiac arrest ([place]) 12/23/2017   Brief V-fib arrest  . Chest pain 09/08/2017  . CHF (congestive heart failure) (HCC)    nonischemic cardiomyopathy, EF 25%  . Gout    "on daily RX" (06/10/2018)  . Headache    "q couple months" (06/10/2018)  . High cholesterol   . History of kidney stones   . Hypertension   . Influenza A 12/24/2017  . NICM (nonischemic cardiomyopathy) ([place])   . Pneumonia 12/20/2017  . TIA (transient ischemic attack) 06/21/2016   "still affects my memory a little bit" (06/10/2018)    Patient Active Problem List   Diagnosis Date Noted  . Acute on chronic respiratory failure with hypoxemia ([employer]) 12/04/2018  . Acute respiratory failure with hypoxia ([place]) 12/03/2018  . Acute respiratory failure ([place]) 12/03/2018  . HTN (hypertension) 11/20/2018  . Hypokalemia 08/19/2018  . Syncope 06/30/2018  . CKD (chronic kidney disease) stage 3, GFR 30-59 ml/min (HCC) 05/13/2018  . Hyperlipidemia  05/13/2018  . ED (erectile dysfunction) 05/13/2018  . Osteoarthritis of right knee 04/30/2018  . Gout 03/26/2018  . VF (ventricular fibrillation) ([place])   . Nonischemic cardiomyopathy ([place])   . Cardiogenic shock ([place]) 12/24/2017  . 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 History of non-ST elevation myocardial infarction (NSTEMI) 09/08/2017  . Bradycardia 08/30/2017  . Ventricular tachycardia ([place]) 07/29/2017  . COPD (chronic obstructive pulmonary disease) ([place]) 07/04/2017  . TIA (transient ischemic attack) 06/22/2016  . Benign hypertensive renal disease 06/22/2016  . Acute on chronic systolic CHF (congestive heart failure) ([place]) 06/04/2016  . Chronic systolic congestive heart failure ([place]) 01/20/2016  . Cardiomyopathy ([place]) 01/20/2016    Past Surgical History:  Procedure Laterality Date  . EXTERNAL FIXATION LEG Right ~ 2000   "was going bowlegged; had to brake my leg to fix it"  . HERNIA REPAIR    . ICD IMPLANT N/A 12/30/2017   Procedure: ICD IMPLANT;  Surgeon: Deboraha Sprang, MD;  Location: [facility];  Service: Cardiovascular;  Laterality: N/A;  . LEFT HEART CATH AND CORONARY ANGIOGRAPHY N/A 09/09/2017   Procedure: LEFT HEART CATH AND CORONARY ANGIOGRAPHY;  Surgeon: Teodoro Spray, MD;  Location: [facility] [facility];  Service: Cardiovascular;  Laterality: N/A;  . UMBILICAL HERNIA REPAIR  1990s  . V TACH ABLATION  06/10/2018  . V TACH ABLATION N/A 06/10/2018   Procedure: V TACH ABLATION;  Surgeon: Thompson Grayer, MD;  Location: [place] [facility];  Service: Cardiovascular;  Laterality: N/A;  .  VASECTOMY      Prior to Admission medications   Medication Sig Start Date End Date Taking? Authorizing Provider  acetaminophen (TYLENOL) 325 MG tablet Take 2 tablets (650 mg total) by mouth every 6 (six) hours as needed. Patient taking differently: Take 325-650 mg by mouth every 6 (six) hours as needed for moderate pain.  03/19/18   Carrie Mew, MD  albuterol (PROVENTIL HFA;VENTOLIN HFA)  108 (90 Base) MCG/ACT inhaler Inhale 2 puffs into the lungs every 4 (four) hours as needed for wheezing or shortness of breath. 09/19/18   Johnson, Megan P, DO  allopurinol (ZYLOPRIM) 100 MG tablet Take 2 tablets (200 mg total) by mouth daily. 10/19/18   Johnson, Megan P, DO  aspirin EC 81 MG EC tablet Take 1 tablet (81 mg total) by mouth daily. 01/01/18   Daune Perch, NP  atorvastatin (LIPITOR) 40 MG tablet Take 80 mg by mouth daily at 6 PM.  10/29/18   [provider]  Camphor-Eucalyptus-Menthol (CHEST RUB) 4.8-1.2-2.6 % OINT Apply 1 application topically daily as needed (congestion).     [provider]  carvedilol (COREG) 3.125 MG tablet Take 1 tablet (3.125 mg total) by mouth 2 (two) times daily with a meal. 07/06/18   Loletha Grayer, MD  clopidogrel (PLAVIX) 75 MG tablet Take 1 tablet (75 mg total) by mouth daily. 01/30/18   Bensimhon, Shaune Pascal, MD  colchicine 0.6 MG tablet Take 1 tablet (0.6 mg total) by mouth daily as needed (gout flare). 09/03/18   Johnson, Megan P, DO  cyclobenzaprine (FLEXERIL) 5 MG tablet Take 1 tablet (5 mg total) by mouth 3 (three) times daily as needed (jaw pain). 11/24/18   Nance Pear, MD  diclofenac sodium (VOLTAREN) 1 % GEL Apply 2 g topically 4 (four) times daily as needed (pain).     [provider]  fluticasone (FLONASE) 50 MCG/ACT nasal spray Place 2 sprays into both nostrils daily. Patient taking differently: Place 2 sprays into both nostrils daily as needed for allergies.  06/20/17   [name], MD  fluticasone-salmeterol (ADVAIR HFA) [phone] MCG/ACT inhaler Inhale 2 puffs into the lungs 2 (two) times daily. 08/12/18   [name], MD  meloxicam (MOBIC) 15 MG tablet Take 15 mg by mouth daily.    [provider]  Menthol, Topical Analgesic, (ICY HOT EX) Apply 1 application topically daily as needed (pain).    [provider]  methimazole (TAPAZOLE) 10 MG tablet Take 4 tablets (40 mg total) by mouth daily.  12/09/18   Mayo, Pete Pelt, MD  metolazone (ZAROXOLYN) 2.5 MG tablet Take 2.5 mg by mouth as needed.  09/05/18   [provider]  Multiple Vitamin (MULTIVITAMIN WITH MINERALS) TABS tablet Take 1 tablet by mouth daily.    [provider]  nitroGLYCERIN (NITROSTAT) 0.4 MG SL tablet Place 1 tablet (0.4 mg total) under the tongue every 5 (five) minutes as needed for chest pain. 02/19/18   Bensimhon, Shaune Pascal, MD  potassium chloride SA (K-DUR,KLOR-CON) 20 MEQ tablet Take 1 tablet (20 mEq total) by mouth 4 (four) times daily. 12/09/18   Mayo, Pete Pelt, MD  predniSONE (DELTASONE) 20 MG tablet Take 2 tablets (40 mg total) by mouth daily with breakfast. Patient not taking: Reported on 12/18/2018 12/09/18   Mayo, Pete Pelt, MD  promethazine (PHENERGAN) 12.5 MG tablet Take 1 tablet (12.5 mg total) by mouth every 6 (six) hours as needed for nausea or vomiting. 11/24/18   Nance Pear, MD  sacubitril-valsartan (ENTRESTO) 24-26 MG  Take 1 tablet by mouth 2 (two) times daily. Patient taking differently: Take 0.5 tablets by mouth 2 (two) times daily.  07/06/18   Loletha Grayer, MD  sildenafil (REVATIO) 20 MG tablet Take by mouth as needed.  10/29/18 10/29/19  [provider]  Tiotropium Bromide Monohydrate (SPIRIVA RESPIMAT) 2.5 MCG/ACT AERS Inhale 1 puff into the lungs daily. 08/12/18   [name], MD  torsemide (DEMADEX) 20 MG tablet Take 2 tablets (40 mg total) by mouth daily. Patient taking differently: Take 20 mg by mouth daily. Take additional 20mg  daily as needed 07/06/18   Loletha Grayer, MD    Allergies Lisinopril  Family History  Problem Relation Age of Onset  . Hypertension Mother   . Heart failure Mother   . Hypertension Father   . CAD Father   . Heart attack Father     Social History Social History   Tobacco Use  . Smoking status: Former Smoker    Packs/day: 0.33    Years: 33.00    Pack years: 10.89    Types: Cigarettes    Last attempt to quit: 02/20/2016     Years since quitting: 2.8  . Smokeless tobacco: Never Used  Substance Use Topics  . Alcohol use: Yes    Alcohol/week: 0.0 standard drinks    Comment: 06/10/2018 "beer once/month; if that"  . Drug use: Never    Review of Systems  Constitutional: No fever/chills Eyes: No visual changes. ENT: No sore throat. Cardiovascular: Denies chest pain. Respiratory: As above Gastrointestinal:   No constipation. Genitourinary: Negative for dysuria. Musculoskeletal: Negative for back pain. Skin: Negative for rash. Neurological: Negative for headaches, focal weakness or numbness.   ____________________________________________   PHYSICAL EXAM:  VITAL SIGNS: ED Triage Vitals  Enc Vitals Group     BP 12/21/18 1139 (!) 125/99     Pulse Rate 12/21/18 1139 (!) 110     Resp 12/21/18 1139 16     Temp 12/21/18 1139 98.1 F (36.7 C)     Temp Source 12/21/18 1139 Oral     SpO2 12/21/18 1139 99 %     Weight 12/21/18 1140 235 lb (106.6 kg)     Height 12/21/18 1140 6' 1.5" (1.867 m)     Head Circumference --      Peak Flow --      Pain Score 12/21/18 1140 0     Pain Loc --      Pain Edu? --      Excl. in [place]? --     Constitutional: Alert and oriented. Well appearing and in no acute distress. Eyes: Conjunctivae are normal.  Head: Atraumatic. Nose: No congestion/rhinnorhea. Mouth/Throat: Mucous membranes are moist.  Neck: No stridor.   Cardiovascular: Normal rate, regular rhythm. Grossly normal heart sounds.   Respiratory: Normal respiratory effort.  No retractions. Lungs CTAB. Gastrointestinal: Soft and nontender. No distention.  Musculoskeletal: No lower extremity tenderness nor edema.  No joint effusions. Neurologic:  Normal speech and language. No gross focal neurologic deficits are appreciated. Skin:  Skin is warm, dry and intact. No rash noted. Psychiatric: Mood and affect are normal. Speech and behavior are normal.  ____________________________________________   LABS (all  labs ordered are listed, but only abnormal results are displayed)  Labs Reviewed  COMPREHENSIVE METABOLIC PANEL - Abnormal; Notable for the following components:      Result Value   Glucose, Bld 177 (*)    BUN 35 (*)    Creatinine, Ser 1.84 (*)    Total  Protein 8.2 (*)    GFR calc non Af Amer 41 (*)    GFR calc Af Amer 47 (*)    All other components within normal limits  CBC - Abnormal; Notable for the following components:   RDW 17.1 (*)    All other components within normal limits  URINALYSIS, COMPLETE (UACMP) WITH MICROSCOPIC - Abnormal; Notable for the following components:   Color, Urine YELLOW (*)    APPearance CLEAR (*)    Protein, ur 30 (*)    Bacteria, UA RARE (*)    All other components within normal limits  TROPONIN I - Abnormal; Notable for the following components:   Troponin I 0.05 (*)    All other components within normal limits  LIPASE, BLOOD   ____________________________________________  EKG  ED ECG REPORT I, Doran Stabler, the attending physician, personally viewed and interpreted this ECG.   Date: 12/21/2018  EKG Time: 1152  Rate: 104  Rhythm: sinus tachycardia  Axis: Left axis  Intervals:nonspecific intraventricular conduction delay  ST&T Change: ST elevations unchanged from previous and consistent with left bundle branch block pattern.  T wave inversions in aVL are also unchanged.   ED ECG REPORT I, Doran Stabler, the attending physician, personally viewed and interpreted this ECG.   Date: 12/21/2018  EKG Time: 1341  Rate: 78  Rhythm: normal sinus rhythm with multiple PVCs  Axis: Normal  Intervals:right bundle branch block.  Prolonged PR.  QTC prolongation.  ST&T Change: No ST segment depression.  Minimal and near diffuse ST elevation.  No significant change from previous including the interval changes.  ____________________________________________  RADIOLOGY  Cardiomegaly with vascular  congestion. ____________________________________________   PROCEDURES  Procedure(s) performed:   Procedures  Critical Care performed:   ____________________________________________   INITIAL IMPRESSION / ASSESSMENT AND PLAN / ED COURSE  Pertinent labs & imaging results that were available during my care of the patient were reviewed by me and considered in my medical decision making (see chart for details).  Differential includes, but is not limited to, viral syndrome, bronchitis including COPD exacerbation, pneumonia, reactive airway disease including asthma, CHF including exacerbation with or without pulmonary/interstitial edema, pneumothorax, ACS, thoracic trauma, and pulmonary embolism. As part of my medical decision making, I reviewed the following data within the electronic MEDICAL RECORD NUMBER Notes from prior ED visits  ----------------------------------------- 3:22 PM on 12/21/2018 -----------------------------------------  Patient feeling much improved.  Requesting food and drink.  Reassuring exam.  Patient with p.o. challenge at this time.  We will also give 40 of Lasix and will need reassessment.  Signed out to Dr. Jimmye Norman.  Likely viral etiology.  Reassuring lab work as well as exam without any abdominal tenderness.  Symptoms are improving.  No reports of blood in the vomitus or stool. ____________________________________________   FINAL CLINICAL IMPRESSION(S) / ED DIAGNOSES  CHF.  Nausea vomiting and diarrhea.  NEW MEDICATIONS STARTED DURING THIS VISIT:  New Prescriptions   No medications on file     Note:  This document was prepared using Dragon voice recognition software and may include unintentional dictation errors.     Orbie Pyo, MD 12/21/18 1523    Clearnce Hasten Randall An, MD 12/21/18 [phone]

## 2018-12-22 NOTE — Telephone Encounter (Signed)
Per [name], he has a message to [name] to discuss amiodarone. [name] is aware I will wait until he hears back from [name] prior to speaking with the patient. [name] was agreeable.

## 2018-12-23 ENCOUNTER — Encounter: Payer: Self-pay | Admitting: *Deleted

## 2018-12-23 ENCOUNTER — Telehealth: Payer: Self-pay | Admitting: *Deleted

## 2018-12-23 DIAGNOSIS — I5022 Chronic systolic (congestive) heart failure: Secondary | ICD-10-CM

## 2018-12-23 NOTE — Telephone Encounter (Signed)
-----   Message from [name], [place] sent at 12/23/2018 10:22 AM EST ----- Regarding: RE: Clearance to return to [place],  It's fine for him to return.  Otila Kluver ----- Message ----- From: Clotilde Dieter Sent: 12/23/2018   9:39 AM EST To: [name], FNP Subject: Clearance to return to Cardiac Rehab           Caryl Pina is currently enrolled in cardiac rehab.  Since he was hospitalized with pneumonia and heart failure I need clearance for him to return.  He has to finish by 12/30/18 due to insurance.  If you are able to send clearance he is going to come this Thursday and Friday and next Monday and Tuesday.  Thanks! Alberteen Sam, MA, RCEP, CCRP 12/23/2018 9:42 AM

## 2018-12-23 NOTE — Telephone Encounter (Signed)
Received forms for md review from [facility] placed in nurse box

## 2018-12-25 DIAGNOSIS — I5022 Chronic systolic (congestive) heart failure: Secondary | ICD-10-CM | POA: Diagnosis present

## 2018-12-25 NOTE — Progress Notes (Signed)
Daily Session Note  Patient Details  Name: Stephen [name] MRN: [id number] Date of Birth: 1966-09-26 Referring Provider:     Cardiac Rehab from 11/10/2018 in [facility] Cardiac and Pulmonary Rehab  Referring Provider  [facility]      Encounter Date: 12/25/2018  Check In: Session Check In - 12/25/18 0937      Check-In   Supervising physician immediately available to respond to emergencies  See telemetry face sheet for immediately available ER MD    Location  [facility]-Cardiac & Pulmonary Rehab    Staff Present  [name] BS, Exercise Physiologist;Carroll Enterkin, RN, BSN;Jessica Luan Pulling, MA, RCEP, CCRP, Exercise Physiologist    Medication changes reported      No    Fall or balance concerns reported     No    Warm-up and Cool-down  Performed as group-led instruction    Resistance Training Performed  Yes    VAD Patient?  No    PAD/SET Patient?  No      Pain Assessment   Currently in Pain?  No/denies          Social History   Tobacco Use  Smoking Status Former Smoker  . Packs/day: 0.33  . Years: 33.00  . Pack years: 10.89  . Types: Cigarettes  . Last attempt to quit: 02/20/2016  . Years since quitting: 2.8  Smokeless Tobacco Never Used    Goals Met:  Independence with exercise equipment Exercise tolerated well Personal goals reviewed No report of cardiac concerns or symptoms Strength training completed today  Goals Unmet:  Not Applicable  Comments: Pt able to follow exercise prescription today without complaint.  Will continue to monitor for progression. Reviewed home exercise with pt today.  Pt plans to go to community gym and then join [employer]. for exercise.  Reviewed THR, pulse, RPE, sign and symptoms, NTG use, and when to call 911 or MD.  Also discussed weather considerations and indoor options.  Pt voiced understanding.    Dr. Emily Filbert is Medical Director for [place] and [facility].

## 2018-12-26 ENCOUNTER — Other Ambulatory Visit: Payer: Self-pay

## 2018-12-26 ENCOUNTER — Encounter: Payer: Self-pay | Admitting: *Deleted

## 2018-12-26 ENCOUNTER — Encounter: Payer: Commercial Managed Care - PPO | Admitting: *Deleted

## 2018-12-26 DIAGNOSIS — I5022 Chronic systolic (congestive) heart failure: Secondary | ICD-10-CM | POA: Insufficient documentation

## 2018-12-26 DIAGNOSIS — R918 Other nonspecific abnormal finding of lung field: Secondary | ICD-10-CM | POA: Insufficient documentation

## 2018-12-26 DIAGNOSIS — N179 Acute kidney failure, unspecified: Secondary | ICD-10-CM | POA: Insufficient documentation

## 2018-12-26 DIAGNOSIS — I440 Atrioventricular block, first degree: Secondary | ICD-10-CM | POA: Diagnosis not present

## 2018-12-26 DIAGNOSIS — R7989 Other specified abnormal findings of blood chemistry: Secondary | ICD-10-CM | POA: Diagnosis present

## 2018-12-26 DIAGNOSIS — Z79899 Other long term (current) drug therapy: Secondary | ICD-10-CM | POA: Diagnosis not present

## 2018-12-26 DIAGNOSIS — I428 Other cardiomyopathies: Secondary | ICD-10-CM | POA: Insufficient documentation

## 2018-12-26 DIAGNOSIS — I2510 Atherosclerotic heart disease of native coronary artery without angina pectoris: Secondary | ICD-10-CM | POA: Insufficient documentation

## 2018-12-26 DIAGNOSIS — Z8249 Family history of ischemic heart disease and other diseases of the circulatory system: Secondary | ICD-10-CM | POA: Insufficient documentation

## 2018-12-26 DIAGNOSIS — Z8673 Personal history of transient ischemic attack (TIA), and cerebral infarction without residual deficits: Secondary | ICD-10-CM | POA: Insufficient documentation

## 2018-12-26 DIAGNOSIS — M109 Gout, unspecified: Secondary | ICD-10-CM | POA: Diagnosis not present

## 2018-12-26 DIAGNOSIS — N183 Chronic kidney disease, stage 3 (moderate): Secondary | ICD-10-CM | POA: Diagnosis not present

## 2018-12-26 DIAGNOSIS — Z7982 Long term (current) use of aspirin: Secondary | ICD-10-CM | POA: Insufficient documentation

## 2018-12-26 DIAGNOSIS — R0989 Other specified symptoms and signs involving the circulatory and respiratory systems: Secondary | ICD-10-CM | POA: Diagnosis not present

## 2018-12-26 DIAGNOSIS — Z9889 Other specified postprocedural states: Secondary | ICD-10-CM | POA: Diagnosis not present

## 2018-12-26 DIAGNOSIS — E785 Hyperlipidemia, unspecified: Secondary | ICD-10-CM | POA: Insufficient documentation

## 2018-12-26 DIAGNOSIS — Z8674 Personal history of sudden cardiac arrest: Secondary | ICD-10-CM | POA: Insufficient documentation

## 2018-12-26 DIAGNOSIS — Z87891 Personal history of nicotine dependence: Secondary | ICD-10-CM | POA: Insufficient documentation

## 2018-12-26 DIAGNOSIS — I130 Hypertensive heart and chronic kidney disease with heart failure and stage 1 through stage 4 chronic kidney disease, or unspecified chronic kidney disease: Secondary | ICD-10-CM | POA: Insufficient documentation

## 2018-12-26 DIAGNOSIS — R112 Nausea with vomiting, unspecified: Secondary | ICD-10-CM | POA: Diagnosis present

## 2018-12-26 DIAGNOSIS — I447 Left bundle-branch block, unspecified: Secondary | ICD-10-CM | POA: Diagnosis not present

## 2018-12-26 DIAGNOSIS — Z7902 Long term (current) use of antithrombotics/antiplatelets: Secondary | ICD-10-CM | POA: Insufficient documentation

## 2018-12-26 DIAGNOSIS — E860 Dehydration: Secondary | ICD-10-CM | POA: Diagnosis not present

## 2018-12-26 DIAGNOSIS — R9431 Abnormal electrocardiogram [ECG] [EKG]: Secondary | ICD-10-CM | POA: Diagnosis not present

## 2018-12-26 DIAGNOSIS — K529 Noninfective gastroenteritis and colitis, unspecified: Secondary | ICD-10-CM | POA: Diagnosis not present

## 2018-12-26 DIAGNOSIS — E876 Hypokalemia: Principal | ICD-10-CM | POA: Insufficient documentation

## 2018-12-26 DIAGNOSIS — Z791 Long term (current) use of non-steroidal anti-inflammatories (NSAID): Secondary | ICD-10-CM | POA: Insufficient documentation

## 2018-12-26 DIAGNOSIS — E039 Hypothyroidism, unspecified: Secondary | ICD-10-CM | POA: Diagnosis not present

## 2018-12-26 DIAGNOSIS — Z888 Allergy status to other drugs, medicaments and biological substances status: Secondary | ICD-10-CM | POA: Insufficient documentation

## 2018-12-26 DIAGNOSIS — Z950 Presence of cardiac pacemaker: Secondary | ICD-10-CM | POA: Insufficient documentation

## 2018-12-26 LAB — COMPREHENSIVE METABOLIC PANEL
ALT: 25 U/L (ref 0–44)
AST: 29 U/L (ref 15–41)
Albumin: 4.3 g/dL (ref 3.5–5.0)
Alkaline Phosphatase: 102 U/L (ref 38–126)
Anion gap: 17 — ABNORMAL HIGH (ref 5–15)
BUN: 64 mg/dL — AB (ref 6–20)
CO2: 23 mmol/L (ref 22–32)
CREATININE: 2.52 mg/dL — AB (ref 0.61–1.24)
Calcium: 9.2 mg/dL (ref 8.9–10.3)
Chloride: 96 mmol/L — ABNORMAL LOW (ref 98–111)
GFR calc Af Amer: 32 mL/min — ABNORMAL LOW (ref >60)
GFR calc non Af Amer: 28 mL/min — ABNORMAL LOW (ref >60)
Glucose, Bld: 209 mg/dL — ABNORMAL HIGH (ref 70–99)
Potassium: 3 mmol/L — ABNORMAL LOW (ref 3.5–5.1)
Sodium: 136 mmol/L (ref 135–145)
Total Bilirubin: 0.7 mg/dL (ref 0.3–1.2)
Total Protein: 8.1 g/dL (ref 6.5–8.1)

## 2018-12-26 LAB — URINALYSIS, COMPLETE (UACMP) WITH MICROSCOPIC
Bacteria, UA: NONE SEEN
Bilirubin Urine: NEGATIVE
Glucose, UA: NEGATIVE mg/dL
Hgb urine dipstick: NEGATIVE
Ketones, ur: NEGATIVE mg/dL
Leukocytes, UA: NEGATIVE
Nitrite: NEGATIVE
Protein, ur: NEGATIVE mg/dL
Specific Gravity, Urine: 1.006 (ref 1.005–1.030)
Squamous Epithelial / HPF: NONE SEEN (ref 0–5)
pH: 6 (ref 5.0–8.0)

## 2018-12-26 LAB — CBC
HCT: 45.6 % (ref 39.0–52.0)
Hemoglobin: 14.9 g/dL (ref 13.0–17.0)
MCH: 28.1 pg (ref 26.0–34.0)
MCHC: 32.7 g/dL (ref 30.0–36.0)
MCV: 86 fL (ref 80.0–100.0)
Platelets: 262 10*3/uL (ref 150–400)
RBC: 5.3 MIL/uL (ref 4.22–5.81)
RDW: 16.1 % — AB (ref 11.5–15.5)
WBC: 8.8 10*3/uL (ref 4.0–10.5)
nRBC: 0 % (ref 0.0–0.2)

## 2018-12-26 LAB — LIPASE, BLOOD: Lipase: 31 U/L (ref 11–51)

## 2018-12-26 NOTE — Progress Notes (Signed)
Daily Session Note  Patient Details  Name: Stephen [name] MRN: [id number] Date of Birth: February 08, 1966 Referring Provider:     Cardiac Rehab from 11/10/2018 in [facility] Cardiac and Pulmonary Rehab  Referring Provider  [facility]      Encounter Date: 12/26/2018  Check In: Session Check In - 12/26/18 0808      Check-In   Supervising physician immediately available to respond to emergencies  See telemetry face sheet for immediately available ER MD    Location  [facility]-Cardiac & Pulmonary Rehab    Staff Present  Alberteen Sam, MA, RCEP, CCRP, Exercise Physiologist;Amanda Oletta Darter, BA, ACSM CEP, Exercise Physiologist;Meredith Sherryll Burger, RN BSN    Medication changes reported      No    Fall or balance concerns reported     No    Warm-up and Cool-down  Performed as group-led Higher education careers adviser Performed  Yes    VAD Patient?  No    PAD/SET Patient?  No      Pain Assessment   Currently in Pain?  No/denies          Social History   Tobacco Use  Smoking Status Former Smoker  . Packs/day: 0.33  . Years: 33.00  . Pack years: 10.89  . Types: Cigarettes  . Last attempt to quit: 02/20/2016  . Years since quitting: 2.8  Smokeless Tobacco Never Used    Goals Met:  Independence with exercise equipment Exercise tolerated well No report of cardiac concerns or symptoms Strength training completed today  Goals Unmet:  Not Applicable  Comments: Pt able to follow exercise prescription today without complaint.  Will continue to monitor for progression.  Champion Name 11/10/18 1536 12/26/18 0809       6 Minute Walk   Phase  Initial  Discharge    Distance  912 feet  1250 feet    Distance % Change  -  37.1 %    Distance Feet Change  -  338 ft    Walk Time  6 minutes  6 minutes    # of Rest Breaks  0  0    MPH  1.73  2.37    METS  2.97  3.58    RPE  14  15    Perceived Dyspnea   2  1    VO2 Peak  10.41  12.52    Symptoms  No  No    Resting HR  76 bpm  50  bpm    Resting BP  106/64  124/70    Resting Oxygen Saturation   97 %  -    Exercise Oxygen Saturation  during 6 min walk  88 %  -    Max Ex. HR  98 bpm  82 bpm    Max Ex. BP  116/68  128/70    2 Minute Post BP  126/68  -         Dr. Emily Filbert is Medical Director for [place] [facility].

## 2018-12-26 NOTE — ED Triage Notes (Signed)
Pt reports vomiting and diarrhea for about 4 days. Pt was lying in bed tonight and felt dizzy and short of breath for a short period of time when it felt like his pacemaker started pacing. No chest pain

## 2018-12-27 ENCOUNTER — Encounter: Payer: Self-pay | Admitting: General Practice

## 2018-12-27 ENCOUNTER — Observation Stay
Admission: EM | Admit: 2018-12-27 | Discharge: 2018-12-28 | Disposition: A | Discharge status: Home / Self Care | Payer: Commercial Managed Care - PPO | Attending: Internal Medicine | Admitting: Internal Medicine

## 2018-12-27 ENCOUNTER — Emergency Department: Payer: Commercial Managed Care - PPO

## 2018-12-27 DIAGNOSIS — N179 Acute kidney failure, unspecified: Secondary | ICD-10-CM | POA: Diagnosis present

## 2018-12-27 DIAGNOSIS — R197 Diarrhea, unspecified: Secondary | ICD-10-CM

## 2018-12-27 DIAGNOSIS — R778 Other specified abnormalities of plasma proteins: Secondary | ICD-10-CM

## 2018-12-27 DIAGNOSIS — R112 Nausea with vomiting, unspecified: Secondary | ICD-10-CM

## 2018-12-27 DIAGNOSIS — E876 Hypokalemia: Secondary | ICD-10-CM | POA: Diagnosis present

## 2018-12-27 DIAGNOSIS — R7989 Other specified abnormal findings of blood chemistry: Secondary | ICD-10-CM

## 2018-12-27 LAB — HEMOGLOBIN A1C
Hgb A1c MFr Bld: 5.7 % — ABNORMAL HIGH (ref 4.8–5.6)
Mean Plasma Glucose: 116.89 mg/dL

## 2018-12-27 LAB — TSH: TSH: 0.07 u[IU]/mL — ABNORMAL LOW (ref 0.350–4.500)

## 2018-12-27 LAB — TROPONIN I
Troponin I: 0.13 ng/mL (ref <0.03)
Troponin I: 0.14 ng/mL (ref <0.03)

## 2018-12-27 MED ORDER — ACETAMINOPHEN 650 MG RE SUPP: 650.0000 mg | Freq: Four times a day (QID) | RECTAL | Status: DC | PRN

## 2018-12-27 MED ORDER — POTASSIUM CHLORIDE 10 MEQ/100ML IV SOLN
10.0000 meq | Freq: Once | INTRAVENOUS | Status: AC
  Administered 2018-12-27: 10 meq via INTRAVENOUS
  Filled 2018-12-27: qty 100

## 2018-12-27 MED ORDER — ONDANSETRON HCL 4 MG/2ML IJ SOLN
4.0000 mg | Freq: Once | INTRAMUSCULAR | Status: AC
  Administered 2018-12-27: 4 mg via INTRAVENOUS
  Filled 2018-12-27: qty 2

## 2018-12-27 MED ORDER — POTASSIUM CHLORIDE CRYS ER 20 MEQ PO TBCR
20.0000 meq | EXTENDED_RELEASE_TABLET | Freq: Four times a day (QID) | ORAL | Status: DC
  Administered 2018-12-27 (×4): 20 meq via ORAL
  Filled 2018-12-27 (×4): qty 1

## 2018-12-27 MED ORDER — ADULT MULTIVITAMIN W/MINERALS CH
1.0000 | ORAL_TABLET | Freq: Every day | ORAL | Status: DC
  Administered 2018-12-28: 1 via ORAL
  Filled 2018-12-27 (×2): qty 1

## 2018-12-27 MED ORDER — FLUTICASONE PROPIONATE 50 MCG/ACT NA SUSP
2.0000 | Freq: Every day | NASAL | Status: DC | PRN
  Filled 2018-12-27: qty 16

## 2018-12-27 MED ORDER — METHIMAZOLE 10 MG PO TABS
40.0000 mg | ORAL_TABLET | Freq: Every day | ORAL | Status: DC
  Filled 2018-12-27 (×2): qty 4

## 2018-12-27 MED ORDER — ACETAMINOPHEN 325 MG PO TABS: 650.0000 mg | ORAL_TABLET | Freq: Four times a day (QID) | ORAL | Status: DC | PRN

## 2018-12-27 MED ORDER — SODIUM CHLORIDE 0.9 % IV BOLUS
500.0000 mL | Freq: Once | INTRAVENOUS | Status: AC
  Administered 2018-12-27: 500 mL via INTRAVENOUS

## 2018-12-27 MED ORDER — ATORVASTATIN CALCIUM 80 MG PO TABS
80.0000 mg | ORAL_TABLET | Freq: Every day | ORAL | Status: DC
  Administered 2018-12-27: 80 mg via ORAL
  Filled 2018-12-27: qty 4
  Filled 2018-12-27 (×2): qty 1

## 2018-12-27 MED ORDER — CLOPIDOGREL BISULFATE 75 MG PO TABS
75.0000 mg | ORAL_TABLET | Freq: Every day | ORAL | Status: DC
  Administered 2018-12-27 – 2018-12-28 (×2): 75 mg via ORAL
  Filled 2018-12-27 (×2): qty 1

## 2018-12-27 MED ORDER — TIOTROPIUM BROMIDE MONOHYDRATE 18 MCG IN CAPS
1.0000 | ORAL_CAPSULE | Freq: Every day | RESPIRATORY_TRACT | Status: DC
  Filled 2018-12-27: qty 5

## 2018-12-27 MED ORDER — COLCHICINE 0.6 MG PO TABS
0.6000 mg | ORAL_TABLET | Freq: Every day | ORAL | Status: DC | PRN
  Filled 2018-12-27: qty 1

## 2018-12-27 MED ORDER — ASPIRIN EC 81 MG PO TBEC
81.0000 mg | DELAYED_RELEASE_TABLET | Freq: Every day | ORAL | Status: DC
  Administered 2018-12-27 – 2018-12-28 (×2): 81 mg via ORAL
  Filled 2018-12-27 (×2): qty 1

## 2018-12-27 MED ORDER — FLUTICASONE FUROATE-VILANTEROL 200-25 MCG/INH IN AEPB
1.0000 | INHALATION_SPRAY | Freq: Every day | RESPIRATORY_TRACT | Status: DC
  Filled 2018-12-27: qty 28

## 2018-12-27 MED ORDER — ONDANSETRON HCL 4 MG PO TABS: 4.0000 mg | ORAL_TABLET | Freq: Four times a day (QID) | ORAL | Status: DC | PRN

## 2018-12-27 MED ORDER — HEPARIN SODIUM (PORCINE) 5000 UNIT/ML IJ SOLN
5000.0000 [IU] | Freq: Three times a day (TID) | INTRAMUSCULAR | Status: DC
  Administered 2018-12-27 – 2018-12-28 (×4): 5000 [IU] via SUBCUTANEOUS
  Filled 2018-12-27 (×4): qty 1

## 2018-12-27 MED ORDER — DOCUSATE SODIUM 100 MG PO CAPS
100.0000 mg | ORAL_CAPSULE | Freq: Two times a day (BID) | ORAL | Status: DC
  Administered 2018-12-27 – 2018-12-28 (×2): 100 mg via ORAL
  Filled 2018-12-27 (×2): qty 1

## 2018-12-27 MED ORDER — METOLAZONE 2.5 MG PO TABS
2.5000 mg | ORAL_TABLET | ORAL | Status: DC | PRN
  Filled 2018-12-27: qty 1

## 2018-12-27 MED ORDER — ONDANSETRON HCL 4 MG/2ML IJ SOLN: 4.0000 mg | Freq: Four times a day (QID) | INTRAMUSCULAR | Status: DC | PRN

## 2018-12-27 MED ORDER — CYCLOBENZAPRINE HCL 10 MG PO TABS
5.0000 mg | ORAL_TABLET | Freq: Three times a day (TID) | ORAL | Status: DC | PRN
  Filled 2018-12-27: qty 0.5

## 2018-12-27 MED ORDER — POTASSIUM CHLORIDE 10 MEQ/100ML IV SOLN
10.0000 meq | Freq: Once | INTRAVENOUS | Status: DC
  Filled 2018-12-27: qty 100

## 2018-12-27 MED ORDER — ALBUTEROL SULFATE (2.5 MG/3ML) 0.083% IN NEBU: 2.5000 mg | INHALATION_SOLUTION | RESPIRATORY_TRACT | Status: DC | PRN

## 2018-12-27 MED ORDER — NITROGLYCERIN 0.4 MG SL SUBL: 0.4000 mg | SUBLINGUAL_TABLET | SUBLINGUAL | Status: DC | PRN

## 2018-12-27 MED ORDER — SODIUM CHLORIDE 0.9 % IV SOLN
INTRAVENOUS | Status: DC
  Administered 2018-12-27 (×2): via INTRAVENOUS

## 2018-12-27 MED ORDER — SACUBITRIL-VALSARTAN 24-26 MG PO TABS
0.5000 | ORAL_TABLET | Freq: Two times a day (BID) | ORAL | Status: DC
  Administered 2018-12-27 – 2018-12-28 (×2): 0.5 via ORAL
  Filled 2018-12-27 (×4): qty 0.5

## 2018-12-27 MED ORDER — ALLOPURINOL 100 MG PO TABS
200.0000 mg | ORAL_TABLET | Freq: Every day | ORAL | Status: DC
  Filled 2018-12-27 (×2): qty 2

## 2018-12-27 MED ORDER — TORSEMIDE 20 MG PO TABS
20.0000 mg | ORAL_TABLET | Freq: Every day | ORAL | Status: DC | PRN
  Filled 2018-12-27: qty 1

## 2018-12-27 MED ORDER — CARVEDILOL 6.25 MG PO TABS
3.1250 mg | ORAL_TABLET | Freq: Two times a day (BID) | ORAL | Status: DC
  Administered 2018-12-27: 3.125 mg via ORAL
  Filled 2018-12-27 (×2): qty 1

## 2018-12-27 NOTE — Progress Notes (Addendum)
[facility] Progress Note Patient Name: ROBINSON BRINKLEY DOB: 21-Mar-1966 MRN: [id number]   Date of Service  12/27/2018  HPI/Events of Note  52 y.o. male with a known history of severe nonischemic cardiomyopathy, hypertension, status post AICD, chronic systolic CHF, previous history of TIA, hyperlipidemia, gout, osteoarthritis, chronic kidney disease stage III who presents to the hospital due to shortness of breath. No H&P in Epic. Being admitted to ICU. PCCM asked to assume care. VSS.   eICU Interventions  No new orders.      Intervention Category Evaluation Type: New Patient Evaluation  Lysle Dingwall 12/27/2018, 6:17 AM

## 2018-12-27 NOTE — ED Notes (Signed)
Darlene in ICU refusing report on patient

## 2018-12-27 NOTE — Progress Notes (Signed)
Patient with no bowel movements during this shift. No complaints of pain, nausea, or dizziness. Good appetite. Up to commode several times to void, steady on his feet, no assist beyond ensuring monitor cords provided enough length for movement. Systolic blood pressure on the lower side through shift, with some values in 70s, but MAP always above 65 and patient reported no symptoms. Patient transferred to room 210, report called to [employer].

## 2018-12-27 NOTE — Progress Notes (Signed)
Floor care.  52 y/o male with a known history of severe nonischemic cardiomyopathy, hypertension, status post AICD, chronic systolic CHF, previous history of TIA, hyperlipidemia, gout, osteoarthritis, chronic kidney disease stage III who presents to the ED due to shortness of breath, nausea, vomiting and diarrhea x 4 days. CXR shows mild vascular congestion, cardiomegaly and mild opacity. No leukocytosis or fever. Currently on room air. Not on antibiotics. Continue all home medications and monitor  Leathie Weich [facility] ANP-BC Pulmonary and Critical Care Medicine [facility] Pager [phone] or [phone]  NB: This document was prepared using Dragon voice recognition software and may include unintentional dictation errors.

## 2018-12-27 NOTE — H&P (Signed)
Stephen [name] is an 52 y.o. male.   Chief Complaint: Vomiting HPI: The patient with past medical history of coronary artery disease and ischemic cardiomyopathy status post pacemaker/AICD placement presents to the emergency department complaining of nonbloody nonbilious emesis.  It started just today but the patient has had diarrhea for the last 2 days.  He had 4 episodes of loose stool when symptoms began and another 3 episodes of loose stool yesterday.  His stools have also been nonbloody and painless.  The patient became concerned when he began vomiting and feeling palpitations.  He admits to feeling dizzy during episodes of racing heartbeat.  He is concerned that his pacemaker is not working correctly.  Laboratory evaluation revealed acute kidney injury which prompted the emergency department staff to call hospitalist service for admission.  Past Medical History:  Diagnosis Date  . AICD (automatic cardioverter/defibrillator) present   . AKI (acute kidney injury) ([place]) 12/24/2017  . Arrhythmia   . Arthritis    "lower back, knees" (06/10/2018)  . Cardiac arrest ([place]) 12/23/2017   Brief V-fib arrest  . Chest pain 09/08/2017  . CHF (congestive heart failure) (HCC)    nonischemic cardiomyopathy, EF 25%  . Gout    "on daily RX" (06/10/2018)  . Headache    "q couple months" (06/10/2018)  . High cholesterol   . History of kidney stones   . Hypertension   . Influenza A 12/24/2017  . NICM (nonischemic cardiomyopathy) ([place])   . Pneumonia 12/20/2017  . TIA (transient ischemic attack) 06/21/2016   "still affects my memory a little bit" (06/10/2018)    Past Surgical History:  Procedure Laterality Date  . EXTERNAL FIXATION LEG Right ~ 2000   "was going bowlegged; had to brake my leg to fix it"  . HERNIA REPAIR    . ICD IMPLANT N/A 12/30/2017   Procedure: ICD IMPLANT;  Surgeon: Deboraha Sprang, MD;  Location: [place] [facility];  Service: Cardiovascular;  Laterality: N/A;  . LEFT HEART CATH AND  CORONARY ANGIOGRAPHY N/A 09/09/2017   Procedure: LEFT HEART CATH AND CORONARY ANGIOGRAPHY;  Surgeon: Teodoro Spray, MD;  Location: [place] [facility];  Service: Cardiovascular;  Laterality: N/A;  . UMBILICAL HERNIA REPAIR  1990s  . V TACH ABLATION  06/10/2018  . V TACH ABLATION N/A 06/10/2018   Procedure: V TACH ABLATION;  Surgeon: Thompson Grayer, MD;  Location: [place] [facility];  Service: Cardiovascular;  Laterality: N/A;  . VASECTOMY      Family History  Problem Relation Age of Onset  . Hypertension Mother   . Heart failure Mother   . Hypertension Father   . CAD Father   . Heart attack Father    Social History:  reports that he quit smoking about 2 years ago. His smoking use included cigarettes. He has a 10.89 pack-year smoking history. He has never used smokeless tobacco. He reports current alcohol use. He reports that he does not use drugs.  Allergies:  Allergies  Allergen Reactions  . Lisinopril Cough    Medications Prior to Admission  Medication Sig Dispense Refill  . acetaminophen (TYLENOL) 325 MG tablet Take 2 tablets (650 mg total) by mouth every 6 (six) hours as needed. (Patient taking differently: Take 325-650 mg by mouth every 6 (six) hours as needed for moderate pain. ) 60 tablet 0  . albuterol (PROVENTIL HFA;VENTOLIN HFA) 108 (90 Base) MCG/ACT inhaler Inhale 2 puffs into the lungs every 4 (four) hours as needed for wheezing or  shortness of breath. 1 Inhaler 0  . allopurinol (ZYLOPRIM) 100 MG tablet Take 2 tablets (200 mg total) by mouth daily. 60 tablet 6  . aspirin EC 81 MG EC tablet Take 1 tablet (81 mg total) by mouth daily. 90 tablet 3  . atorvastatin (LIPITOR) 40 MG tablet Take 80 mg by mouth daily at 6 PM.   6  . CAMPHOR-EUCALYPTUS-MENTHOL EX Apply 1 application topically as needed (congestion).    . carvedilol (COREG) 3.125 MG tablet Take 1 tablet (3.125 mg total) by mouth 2 (two) times daily with a meal. 60 tablet 0  . clopidogrel (PLAVIX) 75 MG tablet Take  1 tablet (75 mg total) by mouth daily. 30 tablet 5  . colchicine 0.6 MG tablet Take 1 tablet (0.6 mg total) by mouth daily as needed (gout flare). 6 tablet 1  . cyclobenzaprine (FLEXERIL) 5 MG tablet Take 1 tablet (5 mg total) by mouth 3 (three) times daily as needed (jaw pain). 20 tablet 0  . diclofenac sodium (VOLTAREN) 1 % GEL Apply 2 g topically 4 (four) times daily as needed (pain).     . fluticasone (FLONASE) 50 MCG/ACT nasal spray Place 2 sprays into both nostrils daily. (Patient taking differently: Place 2 sprays into both nostrils daily as needed for allergies. ) 16 g 2  . fluticasone-salmeterol (ADVAIR HFA) 115-21 MCG/ACT inhaler Inhale 2 puffs into the lungs 2 (two) times daily. 1 Inhaler 12  . meloxicam (MOBIC) 15 MG tablet Take 15 mg by mouth daily.    . Menthol, Topical Analgesic, (ICY HOT EX) Apply 1 application topically daily as needed (pain).    . methimazole (TAPAZOLE) 10 MG tablet Take 4 tablets (40 mg total) by mouth daily. 120 tablet 0  . metolazone (ZAROXOLYN) 2.5 MG tablet Take 2.5 mg by mouth as needed.   11  . Multiple Vitamin (MULTIVITAMIN WITH MINERALS) TABS tablet Take 1 tablet by mouth daily.    . ondansetron (ZOFRAN ODT) 4 MG disintegrating tablet Take 1 tablet (4 mg total) by mouth every 8 (eight) hours as needed for nausea or vomiting. 20 tablet 0  . potassium chloride SA (K-DUR,KLOR-CON) 20 MEQ tablet Take 1 tablet (20 mEq total) by mouth 4 (four) times daily. 120 tablet 0  . promethazine (PHENERGAN) 12.5 MG tablet Take 1 tablet (12.5 mg total) by mouth every 6 (six) hours as needed for nausea or vomiting. 20 tablet 0  . sacubitril-valsartan (ENTRESTO) 24-26 MG Take 1 tablet by mouth 2 (two) times daily. (Patient taking differently: Take 0.5 tablets by mouth 2 (two) times daily. ) 60 tablet 0  . sildenafil (REVATIO) 20 MG tablet Take by mouth as needed.     . Tiotropium Bromide Monohydrate (SPIRIVA RESPIMAT) 2.5 MCG/ACT AERS Inhale 1 puff into the lungs daily. 1  Inhaler 12  . torsemide (DEMADEX) 20 MG tablet Take 2 tablets (40 mg total) by mouth daily. (Patient taking differently: Take 20 mg by mouth daily. Take additional 20mg  daily as needed) 60 tablet 0  . nitroGLYCERIN (NITROSTAT) 0.4 MG SL tablet Place 1 tablet (0.4 mg total) under the tongue every 5 (five) minutes as needed for chest pain. 25 tablet 3    Results for orders placed or performed during the hospital encounter of 12/27/18 (from the past 48 hour(s))  Lipase, blood     Status: None   Collection Time: 12/26/18 11:10 PM  Result Value Ref Range   Lipase 31 11 - 51 U/L    Comment: Performed at [employer]  [facility], [address] [place], [place] [address]  Comprehensive metabolic panel     Status: Abnormal   Collection Time: 12/26/18 11:10 PM  Result Value Ref Range   Sodium 136 135 - 145 mmol/L   Potassium 3.0 (L) 3.5 - 5.1 mmol/L   Chloride 96 (L) 98 - 111 mmol/L   CO2 23 22 - 32 mmol/L   Glucose, Bld 209 (H) 70 - 99 mg/dL   BUN 64 (H) 6 - 20 mg/dL   Creatinine, Ser 2.52 (H) 0.61 - 1.24 mg/dL   Calcium 9.2 8.9 - 10.3 mg/dL   Total Protein 8.1 6.5 - 8.1 g/dL   Albumin 4.3 3.5 - 5.0 g/dL   AST 29 15 - 41 U/L   ALT 25 0 - 44 U/L   Alkaline Phosphatase 102 38 - 126 U/L   Total Bilirubin 0.7 0.3 - 1.2 mg/dL   GFR calc non Af Amer 28 (L) >60 mL/min   GFR calc Af Amer 32 (L) >60 mL/min   Anion gap 17 (H) 5 - 15    Comment: Performed at [facility], [address] [place], [place] [address]  CBC     Status: Abnormal   Collection Time: 12/26/18 11:10 PM  Result Value Ref Range   WBC 8.8 4.0 - 10.5 K/uL   RBC 5.30 4.22 - 5.81 MIL/uL   Hemoglobin 14.9 13.0 - 17.0 g/dL   HCT 45.6 39.0 - 52.0 %   MCV 86.0 80.0 - 100.0 fL   MCH 28.1 26.0 - 34.0 pg   MCHC 32.7 30.0 - 36.0 g/dL   RDW 16.1 (H) 11.5 - 15.5 %   Platelets 262 150 - 400 K/uL   nRBC 0.0 0.0 - 0.2 %    Comment: Performed at [facility], [address] [place], [place] [address]  Urinalysis,  Complete w Microscopic     Status: Abnormal   Collection Time: 12/26/18 11:10 PM  Result Value Ref Range   Color, Urine STRAW (A) YELLOW   APPearance CLEAR (A) CLEAR   Specific Gravity, Urine 1.006 1.005 - 1.030   pH 6.0 5.0 - 8.0   Glucose, UA NEGATIVE NEGATIVE mg/dL   Hgb urine dipstick NEGATIVE NEGATIVE   Bilirubin Urine NEGATIVE NEGATIVE   Ketones, ur NEGATIVE NEGATIVE mg/dL   Protein, ur NEGATIVE NEGATIVE mg/dL   Nitrite NEGATIVE NEGATIVE   Leukocytes, UA NEGATIVE NEGATIVE   RBC / HPF 0-5 0 - 5 RBC/hpf   WBC, UA 0-5 0 - 5 WBC/hpf   Bacteria, UA NONE SEEN NONE SEEN   Squamous Epithelial / LPF NONE SEEN 0 - 5   Mucus PRESENT    Hyaline Casts, UA PRESENT     Comment: Performed at [facility], [address] [place], [place] [address]  Troponin I - Add-On to previous collection     Status: Abnormal   Collection Time: 12/26/18 11:10 PM  Result Value Ref Range   Troponin I 0.13 (HH) <0.03 ng/mL    Comment: CRITICAL RESULT CALLED TO, READ BACK BY AND VERIFIED WITH RAQUEL DAVID ON 12/27/18 AT 0052 [facility] Performed at [facility], [address] [place], [place] [address]   TSH     Status: Abnormal   Collection Time: 12/26/18 11:10 PM  Result Value Ref Range   TSH 0.070 (L) 0.350 - 4.500 uIU/mL    Comment: Performed by a 3rd Generation assay with a functional sensitivity of <=0.01 uIU/mL. Performed at [facility], [phone] [place], [place] [address]   Troponin I - Now Then Q6H     Status: Abnormal   Collection Time: 12/27/18  2:24 AM  Result Value Ref Range   Troponin I 0.14 (HH) <0.03 ng/mL    Comment: CRITICAL VALUE NOTED. VALUE IS CONSISTENT WITH PREVIOUSLY REPORTED/CALLED VALUE [facility] Performed at [facility], [address] [place], [place] [address]    Dg Chest [place] 1 View  Result Date: 12/27/2018 CLINICAL DATA:  Acute onset of vomiting and diarrhea. Dizziness and shortness of breath. EXAM: PORTABLE CHEST 1 VIEW  COMPARISON:  Chest radiograph performed 12/21/2018 FINDINGS: The lungs are well-aerated. Vascular congestion is noted. Vague retrocardiac opacity could reflect pneumonia or asymmetric pulmonary edema. No significant pleural effusion or pneumothorax is seen. The cardiomediastinal silhouette is mildly enlarged. An AICD is noted overlying the left chest wall with a single lead extending overlying the right ventricle. No acute osseous abnormalities are seen. IMPRESSION: Vascular congestion and mild cardiomegaly. Vague retrocardiac opacity could reflect pneumonia or asymmetric pulmonary edema. Electronically Signed   By: Garald Balding M.D.   On: 12/27/2018 03:05    Review of Systems  Constitutional: Negative for chills and fever.  HENT: Negative for sore throat and tinnitus.   Eyes: Negative for blurred vision and redness.  Respiratory: Negative for cough and shortness of breath.   Cardiovascular: Positive for palpitations. Negative for chest pain, orthopnea and PND.  Gastrointestinal: Positive for diarrhea, nausea and vomiting. Negative for abdominal pain.  Genitourinary: Negative for dysuria, frequency and urgency.  Musculoskeletal: Negative for joint pain and myalgias.  Skin: Negative for rash.       No lesions  Neurological: Positive for weakness. Negative for speech change and focal weakness.  Endo/Heme/Allergies: Does not bruise/bleed easily.       No temperature intolerance  Psychiatric/Behavioral: Negative for depression and suicidal ideas.    Blood pressure 108/74, pulse 62, temperature 98.9 F (37.2 C), temperature source Oral, resp. rate 20, height 6\' 1"  (1.854 m), weight 99.8 kg, SpO2 94 %. Physical Exam  Vitals reviewed. Constitutional: He is oriented to person, place, and time. He appears well-developed and well-nourished. No distress.  HENT:  Head: Normocephalic and atraumatic.  Mouth/Throat: Oropharynx is clear and moist.  Eyes: Pupils are equal, round, and reactive to light.  Conjunctivae and EOM are normal. No scleral icterus.  Neck: Normal range of motion. Neck supple. No JVD present. No tracheal deviation present. No thyromegaly present.  Cardiovascular: Normal rate and regular rhythm. Exam reveals no gallop and no friction rub.  No murmur heard. Respiratory: Effort normal and breath sounds normal. No respiratory distress.  GI: Soft. Bowel sounds are normal. He exhibits no distension. There is no abdominal tenderness.  Genitourinary:    Genitourinary Comments: Deferred   Musculoskeletal: Normal range of motion.        General: No edema.  Lymphadenopathy:    He has no cervical adenopathy.  Neurological: He is alert and oriented to person, place, and time. No cranial nerve deficit.  Skin: Skin is warm and dry. No rash noted. No erythema.  Psychiatric: He has a normal mood and affect. His behavior is normal. Judgment and thought content normal.     Assessment/Plan This is a 52 year old male admitted for acute kidney injury. 1.  Acute kidney injury: Secondary to dehydration due to vomiting and diarrhea.  Hydrate with intravenous fluid. 2.  Diarrhea: Check GI panel as well as C. difficile.  The patient just finished antibiotics for  pneumonia. 3.  Palpitations: The patient has had some episodes of bradycardia followed by initiation of pacing which makes him lightheaded.  Consult cardiology for input regarding pacemaker function. 4.  CHF: Chronic; last EF 10 to 15%.  AICD in place.  The patient is euvolemic.  Continue Entresto and Coreg.  Continue diuretics as needed. 5.  Hyperthyroidism: TSH checked on admission found to be low.  Likely contributing to palpitations.  Patient likely nn-compliant with methimazole.  Restart per home regimen 6.  CAD: Stable; continue aspirin and Plavix 7.  Hyperlipidemia: Continue statin therapy 8.  Gout: Stable; continue allopurinol and colchicine. 9.  DVT prophylaxis: Heparin 10.  GI prophylaxis: None The patient is a partial  code.  Time spent on admission orders and patient care approximately 45 minutes  Harrie Foreman, MD 12/27/2018, 7:11 AM

## 2018-12-27 NOTE — Consult Note (Signed)
Reason for Consult: Nonischemic cardiomyopathy AICD nausea vomiting dehydration Referring Physician: Dr. Rosilyn Mings hospitalist Cardiologist [facility]  Stephen [name] is an 52 y.o. male.  HPI: Patient is a 52 year old black male history of nonischemic severe cardiomyopathy with pacemaker AICD in place no recent discharges presented after eating at a friend's house patient had significant nausea vomiting with diarrhea nonbloody nonbilious emesis multiple episodes none since yesterday no significant fever.  Denies any blood complains of feeling lightheaded and dizzy with a racing heart patient has renal insufficiency patient reportedly had recent antibiotic therapy for pneumonia and now the son concerned that this may be C. difficile in nature still admitted to ICU for further management and care  Past Medical History:  Diagnosis Date  . AICD (automatic cardioverter/defibrillator) present   . AKI (acute kidney injury) ([place]) 12/24/2017  . Arrhythmia   . Arthritis    "lower back, knees" (06/10/2018)  . Cardiac arrest ([place]) 12/23/2017   Brief V-fib arrest  . Chest pain 09/08/2017  . CHF (congestive heart failure) (HCC)    nonischemic cardiomyopathy, EF 25%  . Gout    "on daily RX" (06/10/2018)  . Headache    "q couple months" (06/10/2018)  . High cholesterol   . History of kidney stones   . Hypertension   . Influenza A 12/24/2017  . NICM (nonischemic cardiomyopathy) ([place])   . Pneumonia 12/20/2017  . TIA (transient ischemic attack) 06/21/2016   "still affects my memory a little bit" (06/10/2018)    Past Surgical History:  Procedure Laterality Date  . EXTERNAL FIXATION LEG Right ~ 2000   "was going bowlegged; had to brake my leg to fix it"  . HERNIA REPAIR    . ICD IMPLANT N/A 12/30/2017   Procedure: ICD IMPLANT;  Surgeon: Deboraha Sprang, MD;  Location: [place] [facility];  Service: Cardiovascular;  Laterality: N/A;  . LEFT HEART CATH AND CORONARY ANGIOGRAPHY N/A 09/09/2017   Procedure: LEFT HEART CATH AND CORONARY ANGIOGRAPHY;  Surgeon: Teodoro Spray, MD;  Location: [place] [facility];  Service: Cardiovascular;  Laterality: N/A;  . UMBILICAL HERNIA REPAIR  1990s  . V TACH ABLATION  06/10/2018  . V TACH ABLATION N/A 06/10/2018   Procedure: V TACH ABLATION;  Surgeon: Thompson Grayer, MD;  Location: [place] [facility];  Service: Cardiovascular;  Laterality: N/A;  . VASECTOMY      Family History  Problem Relation Age of Onset  . Hypertension Mother   . Heart failure Mother   . Hypertension Father   . CAD Father   . Heart attack Father     Social History:  reports that he quit smoking about 2 years ago. His smoking use included cigarettes. He has a 10.89 pack-year smoking history. He has never used smokeless tobacco. He reports current alcohol use. He reports that he does not use drugs.  Allergies:  Allergies  Allergen Reactions  . Lisinopril Cough    Medications: I have reviewed the patient's current medications.  Results for orders placed or performed during the hospital encounter of 12/27/18 (from the past 48 hour(s))  Lipase, blood     Status: None   Collection Time: 12/26/18 11:10 PM  Result Value Ref Range   Lipase 31 11 - 51 U/L    Comment: Performed at [facility], [address], [place], [place] [address]  Comprehensive metabolic panel     Status: Abnormal   Collection Time: 12/26/18 11:10 PM  Result Value Ref Range  Sodium 136 135 - 145 mmol/L   Potassium 3.0 (L) 3.5 - 5.1 mmol/L   Chloride 96 (L) 98 - 111 mmol/L   CO2 23 22 - 32 mmol/L   Glucose, Bld 209 (H) 70 - 99 mg/dL   BUN 64 (H) 6 - 20 mg/dL   Creatinine, Ser 2.52 (H) 0.61 - 1.24 mg/dL   Calcium 9.2 8.9 - 10.3 mg/dL   Total Protein 8.1 6.5 - 8.1 g/dL   Albumin 4.3 3.5 - 5.0 g/dL   AST 29 15 - 41 U/L   ALT 25 0 - 44 U/L   Alkaline Phosphatase 102 38 - 126 U/L   Total Bilirubin 0.7 0.3 - 1.2 mg/dL   GFR calc non Af Amer 28 (L) >60 mL/min   GFR calc Af Amer 32 (L)  >60 mL/min   Anion gap 17 (H) 5 - 15    Comment: Performed at [facility], [address] [place], [place] [address]  CBC     Status: Abnormal   Collection Time: 12/26/18 11:10 PM  Result Value Ref Range   WBC 8.8 4.0 - 10.5 K/uL   RBC 5.30 4.22 - 5.81 MIL/uL   Hemoglobin 14.9 13.0 - 17.0 g/dL   HCT 45.6 39.0 - 52.0 %   MCV 86.0 80.0 - 100.0 fL   MCH 28.1 26.0 - 34.0 pg   MCHC 32.7 30.0 - 36.0 g/dL   RDW 16.1 (H) 11.5 - 15.5 %   Platelets 262 150 - 400 K/uL   nRBC 0.0 0.0 - 0.2 %    Comment: Performed at [facility], [address] [place], [place] [address]  Urinalysis, Complete w Microscopic     Status: Abnormal   Collection Time: 12/26/18 11:10 PM  Result Value Ref Range   Color, Urine STRAW (A) YELLOW   APPearance CLEAR (A) CLEAR   Specific Gravity, Urine 1.006 1.005 - 1.030   pH 6.0 5.0 - 8.0   Glucose, UA NEGATIVE NEGATIVE mg/dL   Hgb urine dipstick NEGATIVE NEGATIVE   Bilirubin Urine NEGATIVE NEGATIVE   Ketones, ur NEGATIVE NEGATIVE mg/dL   Protein, ur NEGATIVE NEGATIVE mg/dL   Nitrite NEGATIVE NEGATIVE   Leukocytes, UA NEGATIVE NEGATIVE   RBC / HPF 0-5 0 - 5 RBC/hpf   WBC, UA 0-5 0 - 5 WBC/hpf   Bacteria, UA NONE SEEN NONE SEEN   Squamous Epithelial / LPF NONE SEEN 0 - 5   Mucus PRESENT    Hyaline Casts, UA PRESENT     Comment: Performed at [facility], [address] [place], [place] [address]  Troponin I - Add-On to previous collection     Status: Abnormal   Collection Time: 12/26/18 11:10 PM  Result Value Ref Range   Troponin I 0.13 (HH) <0.03 ng/mL    Comment: CRITICAL RESULT CALLED TO, READ BACK BY AND VERIFIED WITH RAQUEL DAVID ON 12/27/18 AT 0052 [facility] Performed at [facility], [address] [place], [place] [address]   TSH     Status: Abnormal   Collection Time: 12/26/18 11:10 PM  Result Value Ref Range   TSH 0.070 (L) 0.350 - 4.500 uIU/mL    Comment: Performed by a 3rd Generation assay with a functional  sensitivity of <=0.01 uIU/mL. Performed at [facility] [address] [place], [place] [address]   Troponin I - Now Then Q6H     Status: Abnormal   Collection Time: 12/27/18  2:24 AM  Result Value Ref  Range   Troponin I 0.14 (HH) <0.03 ng/mL    Comment: CRITICAL VALUE NOTED. VALUE IS CONSISTENT WITH PREVIOUSLY REPORTED/CALLED VALUE [facility] Performed at [facility], [address] [place], [place] [address]     Dg Chest [place] 1 View  Result Date: 12/27/2018 CLINICAL DATA:  Acute onset of vomiting and diarrhea. Dizziness and shortness of breath. EXAM: PORTABLE CHEST 1 VIEW COMPARISON:  Chest radiograph performed 12/21/2018 FINDINGS: The lungs are well-aerated. Vascular congestion is noted. Vague retrocardiac opacity could reflect pneumonia or asymmetric pulmonary edema. No significant pleural effusion or pneumothorax is seen. The cardiomediastinal silhouette is mildly enlarged. An AICD is noted overlying the left chest wall with a single lead extending overlying the right ventricle. No acute osseous abnormalities are seen. IMPRESSION: Vascular congestion and mild cardiomegaly. Vague retrocardiac opacity could reflect pneumonia or asymmetric pulmonary edema. Electronically Signed   By: Garald Balding M.D.   On: 12/27/2018 03:05    Review of Systems  Constitutional: Positive for diaphoresis, malaise/fatigue and weight loss.  HENT: Negative.   Eyes: Negative.   Respiratory: Positive for shortness of breath.   Cardiovascular: Positive for leg swelling and PND.  Gastrointestinal: Positive for abdominal pain, diarrhea, heartburn, nausea and vomiting.  Genitourinary: Negative.   Musculoskeletal: Positive for myalgias.  Skin: Negative.   Neurological: Positive for weakness.  Endo/Heme/Allergies: Negative.   Psychiatric/Behavioral: Negative.    Blood pressure 98/62, pulse 62, temperature 97.6 F (36.4 C), temperature source Oral, resp. rate 19, height 6\' 1"  (1.854 m), weight  99.8 kg, SpO2 98 %. Physical Exam  Nursing note and vitals reviewed. Constitutional: He is oriented to person, place, and time. He appears well-developed and well-nourished.  HENT:  Head: Normocephalic and atraumatic.  Eyes: Pupils are equal, round, and reactive to light. Conjunctivae and EOM are normal.  Neck: Normal range of motion. Neck supple.  Cardiovascular: Normal rate and regular rhythm. Exam reveals gallop.  Murmur heard. Respiratory: Effort normal and breath sounds normal.  GI: Soft. Bowel sounds are normal.  Musculoskeletal: Normal range of motion.        General: Edema present.  Neurological: He is alert and oriented to person, place, and time. He has normal reflexes.  Skin: Skin is warm and dry.  Psychiatric: He has a normal mood and affect.    Assessment/Plan: Gastroenteritis Dehydration Diarrhea Acute renal insufficiency Hypertension Nonischemic cardiomyopathy Nausea vomiting . Plan Agree with ICU level care Continue gentle hydration Consult nephrology for evaluation of renal insufficiency Continue heart failure medications Recommend GI input consult for gastroenteritis Consider colonoscopy if diarrhea continues We will evaluate for possible C. difficile with recent antibiotic therapy Recommend limited cardiac therapy at this point    D  12/27/2018, 11:44 AM

## 2018-12-27 NOTE — ED Provider Notes (Signed)
[facility] Emergency Department Provider Note   ____________________________________________   First MD Initiated Contact with Patient 12/27/18 [phone]     (approximate)  I have reviewed the triage vital signs and the nursing notes.   HISTORY  Chief Complaint Emesis    HPI Stephen [name] is a 52 y.o. male who presents to the ED from home with a chief complaint of nausea/vomiting/diarrhea.  Patient reports a 4-day history of the above symptoms.  Patient was laying in bed tonight and felt dizzy and short of breath and felt his pacer kick in because he had a head rush.  He clarified that it did not feel like his defibrillator kicked him in the chest but rather he felt palpitations and his pacemaker taken.  He does have a history of nonischemic cardiomyopathy, ventricular fibrillation status post AICD, CKD stage III, COPD.  Denies associated fever, chills, abdominal pain, dysuria.  Denies recent travel, trauma or antibiotic use.   Past Medical History:  Diagnosis Date  . AICD (automatic cardioverter/defibrillator) present   . AKI (acute kidney injury) ([place]) 12/24/2017  . Arrhythmia   . Arthritis    "lower back, knees" (06/10/2018)  . Cardiac arrest ([place]) 12/23/2017   Brief V-fib arrest  . Chest pain 09/08/2017  . CHF (congestive heart failure) (HCC)    nonischemic cardiomyopathy, EF 25%  . Gout    "on daily RX" (06/10/2018)  . Headache    "q couple months" (06/10/2018)  . High cholesterol   . History of kidney stones   . Hypertension   . Influenza A 12/24/2017  . NICM (nonischemic cardiomyopathy) ([place])   . Pneumonia 12/20/2017  . TIA (transient ischemic attack) 06/21/2016   "still affects my memory a little bit" (06/10/2018)    Patient Active Problem List   Diagnosis Date Noted  . AKI (acute kidney injury) ([place]) 12/27/2018  . 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 Acute on chronic respiratory failure with hypoxemia ([place]) 12/04/2018  .  Acute respiratory failure with hypoxia ([place]) 12/03/2018  . Acute respiratory failure ([place]) 12/03/2018  . HTN (hypertension) 11/20/2018  . Hypokalemia 08/19/2018  . Syncope 06/30/2018  . CKD (chronic kidney disease) stage 3, GFR 30-59 ml/min (HCC) 05/13/2018  . Hyperlipidemia 05/13/2018  . ED (erectile dysfunction) 05/13/2018  . Osteoarthritis of right knee 04/30/2018  . Gout 03/26/2018  . VF (ventricular fibrillation) ([place])   . Nonischemic cardiomyopathy ([place])   . Cardiogenic shock ([place]) 12/24/2017  . 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 History of non-ST elevation myocardial infarction (NSTEMI) 09/08/2017  . Bradycardia 08/30/2017  . Ventricular tachycardia ([place]) 07/29/2017  . COPD (chronic obstructive pulmonary disease) ([place]) 07/04/2017  . TIA (transient ischemic attack) 06/22/2016  . Benign hypertensive renal disease 06/22/2016  . Acute on chronic systolic CHF (congestive heart failure) ([place]) 06/04/2016  . Chronic systolic congestive heart failure ([place]) 01/20/2016  . Cardiomyopathy ([place]) 01/20/2016    Past Surgical History:  Procedure Laterality Date  . EXTERNAL FIXATION LEG Right ~ 2000   "was going bowlegged; had to brake my leg to fix it"  . HERNIA REPAIR    . ICD IMPLANT N/A 12/30/2017   Procedure: ICD IMPLANT;  Surgeon: Deboraha Sprang, MD;  Location: [place] [facility];  Service: Cardiovascular;  Laterality: N/A;  . LEFT HEART CATH AND CORONARY ANGIOGRAPHY N/A 09/09/2017   Procedure: LEFT HEART CATH AND CORONARY ANGIOGRAPHY;  Surgeon: Teodoro Spray, MD;  Location: [place] [facility];  Service: Cardiovascular;  Laterality: N/A;  .  UMBILICAL HERNIA REPAIR  1990s  . V TACH ABLATION  06/10/2018  . V TACH ABLATION N/A 06/10/2018   Procedure: V TACH ABLATION;  Surgeon: Thompson Grayer, MD;  Location: [place] [facility];  Service: Cardiovascular;  Laterality: N/A;  . VASECTOMY      Prior to Admission medications   Medication Sig Start Date End Date Taking? Authorizing Provider    acetaminophen (TYLENOL) 325 MG tablet Take 2 tablets (650 mg total) by mouth every 6 (six) hours as needed. Patient taking differently: Take 325-650 mg by mouth every 6 (six) hours as needed for moderate pain.  03/19/18  Yes Carrie Mew, MD  albuterol (PROVENTIL HFA;VENTOLIN HFA) 108 (90 Base) MCG/ACT inhaler Inhale 2 puffs into the lungs every 4 (four) hours as needed for wheezing or shortness of breath. 09/19/18  Yes Johnson, Megan P, DO  allopurinol (ZYLOPRIM) 100 MG tablet Take 2 tablets (200 mg total) by mouth daily. 10/19/18  Yes Johnson, Megan P, DO  aspirin EC 81 MG EC tablet Take 1 tablet (81 mg total) by mouth daily. 01/01/18  Yes Daune Perch, NP  atorvastatin (LIPITOR) 40 MG tablet Take 80 mg by mouth daily at 6 PM.  10/29/18  Yes [provider]  CAMPHOR-EUCALYPTUS-MENTHOL EX Apply 1 application topically as needed (congestion).   Yes [provider]  carvedilol (COREG) 3.125 MG tablet Take 1 tablet (3.125 mg total) by mouth 2 (two) times daily with a meal. 07/06/18  Yes Wieting, Richard, MD  clopidogrel (PLAVIX) 75 MG tablet Take 1 tablet (75 mg total) by mouth daily. 01/30/18  Yes Bensimhon, Shaune Pascal, MD  colchicine 0.6 MG tablet Take 1 tablet (0.6 mg total) by mouth daily as needed (gout flare). 09/03/18  Yes Johnson, Megan P, DO  cyclobenzaprine (FLEXERIL) 5 MG tablet Take 1 tablet (5 mg total) by mouth 3 (three) times daily as needed (jaw pain). 11/24/18  Yes Nance Pear, MD  diclofenac sodium (VOLTAREN) 1 % GEL Apply 2 g topically 4 (four) times daily as needed (pain).    Yes [provider]  fluticasone (FLONASE) 50 MCG/ACT nasal spray Place 2 sprays into both nostrils daily. Patient taking differently: Place 2 sprays into both nostrils daily as needed for allergies.  06/20/17  Yes [name], MD  fluticasone-salmeterol (ADVAIR HFA) [phone] MCG/ACT inhaler Inhale 2 puffs into the lungs 2 (two) times daily. 08/12/18  Yes Kasa, Maretta Bees, MD   meloxicam (MOBIC) 15 MG tablet Take 15 mg by mouth daily.   Yes [provider]  Menthol, Topical Analgesic, (ICY HOT EX) Apply 1 application topically daily as needed (pain).   Yes [provider]  methimazole (TAPAZOLE) 10 MG tablet Take 4 tablets (40 mg total) by mouth daily. 12/09/18  Yes Mayo, Pete Pelt, MD  metolazone (ZAROXOLYN) 2.5 MG tablet Take 2.5 mg by mouth as needed.  09/05/18  Yes [provider]  Multiple Vitamin (MULTIVITAMIN WITH MINERALS) TABS tablet Take 1 tablet by mouth daily.   Yes [provider]  ondansetron (ZOFRAN ODT) 4 MG disintegrating tablet Take 1 tablet (4 mg total) by mouth every 8 (eight) hours as needed for nausea or vomiting. 12/21/18  Yes Earleen Newport, MD  potassium chloride SA (K-DUR,KLOR-CON) 20 MEQ tablet Take 1 tablet (20 mEq total) by mouth 4 (four) times daily. 12/09/18  Yes Mayo, Pete Pelt, MD  promethazine (PHENERGAN) 12.5 MG tablet Take 1 tablet (12.5 mg total) by mouth every 6 (six) hours as needed for nausea or vomiting. 11/24/18  Yes Nance Pear, MD  sacubitril-valsartan (ENTRESTO) 24-26 MG Take 1 tablet by mouth 2 (two) times daily. Patient taking differently: Take 0.5 tablets by mouth 2 (two) times daily.  07/06/18  Yes Wieting, Richard, MD  sildenafil (REVATIO) 20 MG tablet Take by mouth as needed.  10/29/18 10/29/19 Yes [provider]  Tiotropium Bromide Monohydrate (SPIRIVA RESPIMAT) 2.5 MCG/ACT AERS Inhale 1 puff into the lungs daily. 08/12/18  Yes [name], MD  torsemide (DEMADEX) 20 MG tablet Take 2 tablets (40 mg total) by mouth daily. Patient taking differently: Take 20 mg by mouth daily. Take additional 20mg  daily as needed 07/06/18  Yes Wieting, Richard, MD  nitroGLYCERIN (NITROSTAT) 0.4 MG SL tablet Place 1 tablet (0.4 mg total) under the tongue every 5 (five) minutes as needed for chest pain. 02/19/18   Bensimhon, Shaune Pascal, MD    Allergies Lisinopril  Family History  Problem  Relation Age of Onset  . Hypertension Mother   . Heart failure Mother   . Hypertension Father   . CAD Father   . Heart attack Father     Social History Social History   Tobacco Use  . Smoking status: Former Smoker    Packs/day: 0.33    Years: 33.00    Pack years: 10.89    Types: Cigarettes    Last attempt to quit: 02/20/2016    Years since quitting: 2.8  . Smokeless tobacco: Never Used  Substance Use Topics  . Alcohol use: Yes    Alcohol/week: 0.0 standard drinks    Comment: 06/10/2018 "beer once/month; if that"  . Drug use: Never    Review of Systems  Constitutional: No fever/chills Eyes: No visual changes. ENT: No sore throat. Cardiovascular: Positive for palpitations.  Denies chest pain. Respiratory: Denies shortness of breath. Gastrointestinal: No abdominal pain.  Positive for nausea, vomiting and diarrhea.  No constipation. Genitourinary: Negative for dysuria. Musculoskeletal: Negative for back pain. Skin: Negative for rash. Neurological: Negative for headaches, focal weakness or numbness.   ____________________________________________   PHYSICAL EXAM:  VITAL SIGNS: ED Triage Vitals  Enc Vitals Group     BP 12/26/18 2308 (!) 120/94     Pulse Rate 12/26/18 2308 92     Resp 12/26/18 2308 20     Temp 12/26/18 2308 98.9 F (37.2 C)     Temp Source 12/26/18 2308 Oral     SpO2 12/26/18 2308 97 %     Weight 12/26/18 2308 220 lb (99.8 kg)     Height 12/26/18 2308 6\' 1"  (1.854 m)     Head Circumference --      Peak Flow --      Pain Score 12/26/18 2307 7     Pain Loc --      Pain Edu? --      Excl. in [place]? --     Constitutional: Alert and oriented. Well appearing and in mild acute distress. Eyes: Conjunctivae are normal. PERRL. EOMI. Head: Atraumatic. Nose: No congestion/rhinnorhea. Mouth/Throat: Mucous membranes are moist.  Oropharynx non-erythematous. Neck: No stridor.   Cardiovascular: Normal rate, regular rhythm. Grossly normal heart sounds.  Good  peripheral circulation. Respiratory: Normal respiratory effort.  No retractions. Lungs CTAB. Gastrointestinal: Soft and nontender to light or deep palpation. No distention. No abdominal bruits. No CVA tenderness. Musculoskeletal: No lower extremity tenderness nor edema.  No joint effusions. Neurologic:  Normal speech and language. No gross focal neurologic deficits are appreciated. No gait instability. Skin:  Skin is warm, dry and intact. No  rash noted. Psychiatric: Mood and affect are normal. Speech and behavior are normal.  ____________________________________________   LABS (all labs ordered are listed, but only abnormal results are displayed)  Labs Reviewed  COMPREHENSIVE METABOLIC PANEL - Abnormal; Notable for the following components:      Result Value   Potassium 3.0 (*)    Chloride 96 (*)    Glucose, Bld 209 (*)    BUN 64 (*)    Creatinine, Ser 2.52 (*)    GFR calc non Af Amer 28 (*)    GFR calc Af Amer 32 (*)    Anion gap 17 (*)    All other components within normal limits  CBC - Abnormal; Notable for the following components:   RDW 16.1 (*)    All other components within normal limits  URINALYSIS, COMPLETE (UACMP) WITH MICROSCOPIC - Abnormal; Notable for the following components:   Color, Urine STRAW (*)    APPearance CLEAR (*)    All other components within normal limits  TROPONIN I - Abnormal; Notable for the following components:   Troponin I 0.13 (*)    All other components within normal limits  TROPONIN I - Abnormal; Notable for the following components:   Troponin I 0.14 (*)    All other components within normal limits  GASTROINTESTINAL PANEL BY PCR, STOOL (REPLACES STOOL CULTURE)  LIPASE, BLOOD   ____________________________________________  EKG  ED ECG REPORT I, Medora Roorda J, the attending physician, personally viewed and interpreted this ECG.   Date: 12/27/2018  EKG Time: 2308  Rate: 90  Rhythm: normal EKG, normal sinus rhythm  Axis: LAD   Intervals:left bundle branch block  ST&T Change: Nonspecific No significant change from 12/22/2018  ____________________________________________  RADIOLOGY  ED MD interpretation: Pulmonary vascular congestion  Official radiology report(s): Dg Chest Port 1 View  Result Date: 12/27/2018 CLINICAL DATA:  Acute onset of vomiting and diarrhea. Dizziness and shortness of breath. EXAM: PORTABLE CHEST 1 VIEW COMPARISON:  Chest radiograph performed 12/21/2018 FINDINGS: The lungs are well-aerated. Vascular congestion is noted. Vague retrocardiac opacity could reflect pneumonia or asymmetric pulmonary edema. No significant pleural effusion or pneumothorax is seen. The cardiomediastinal silhouette is mildly enlarged. An AICD is noted overlying the left chest wall with a single lead extending overlying the right ventricle. No acute osseous abnormalities are seen. IMPRESSION: Vascular congestion and mild cardiomegaly. Vague retrocardiac opacity could reflect pneumonia or asymmetric pulmonary edema. Electronically Signed   By: Garald Balding M.D.   On: 12/27/2018 03:05    ____________________________________________   PROCEDURES  Procedure(s) performed: None  Procedures  Critical Care performed: Yes, see critical care note(s)   CRITICAL CARE Performed by: Paulette Blanch   Total critical care time: 30 minutes  Critical care time was exclusive of separately billable procedures and treating other patients.  Critical care was necessary to treat or prevent imminent or life-threatening deterioration.  Critical care was time spent personally by me on the following activities: development of treatment plan with patient and/or surrogate as well as nursing, discussions with consultants, evaluation of patient's response to treatment, examination of patient, obtaining history from patient or surrogate, ordering and performing treatments and interventions, ordering and review of laboratory studies, ordering  and review of radiographic studies, pulse oximetry and re-evaluation of patient's condition.  ____________________________________________   INITIAL IMPRESSION / ASSESSMENT AND PLAN / ED COURSE  As part of my medical decision making, I reviewed the following data within the [facility] notes reviewed and incorporated, Labs  reviewed, EKG interpreted, Old chart reviewed, Radiograph reviewed, Discussed with admitting physician and Notes from prior ED visits   52 year old male with nonischemic cardiomyopathy, status post AICD, CKD who presents with nausea/vomiting/diarrhea. Differential diagnosis includes, but is not limited to, acute appendicitis, renal colic, testicular torsion, urinary tract infection/pyelonephritis, prostatitis,  epididymitis, diverticulitis, small bowel obstruction or ileus, colitis, abdominal aortic aneurysm, gastroenteritis, hernia, etc.  Laboratory results remarkable for acute kidney injury, hypokalemia, troponin elevated past baseline.  Will administer judicious fluid bolus,  4 mg IV Zofran for nausea, IV potassium.  Discussed with hospitalist who will evaluate patient in the emergency department for admission.      ____________________________________________   FINAL CLINICAL IMPRESSION(S) / ED DIAGNOSES  Final diagnoses:  Nausea vomiting and diarrhea  Elevated troponin  Hypokalemia     ED Discharge Orders    None       Note:  This document was prepared using Dragon voice recognition software and may include unintentional dictation errors.    Paulette Blanch, MD 12/27/18 [phone]

## 2018-12-28 LAB — BASIC METABOLIC PANEL
ANION GAP: 9 (ref 5–15)
BUN: 51 mg/dL — ABNORMAL HIGH (ref 6–20)
CALCIUM: 8.8 mg/dL — AB (ref 8.9–10.3)
CO2: 26 mmol/L (ref 22–32)
Chloride: 104 mmol/L (ref 98–111)
Creatinine, Ser: 1.57 mg/dL — ABNORMAL HIGH (ref 0.61–1.24)
GFR calc non Af Amer: 50 mL/min — ABNORMAL LOW (ref >60)
GFR, EST AFRICAN AMERICAN: 57 mL/min — AB (ref >60)
Glucose, Bld: 104 mg/dL — ABNORMAL HIGH (ref 70–99)
Potassium: 3.2 mmol/L — ABNORMAL LOW (ref 3.5–5.1)
Sodium: 139 mmol/L (ref 135–145)

## 2018-12-28 MED ORDER — POTASSIUM CHLORIDE CRYS ER 20 MEQ PO TBCR
40.0000 meq | EXTENDED_RELEASE_TABLET | Freq: Once | ORAL | Status: AC
  Administered 2018-12-28: 40 meq via ORAL
  Filled 2018-12-28: qty 2

## 2018-12-28 NOTE — Discharge Instructions (Signed)
Resume diet and activity as before ° ° °

## 2018-12-30 ENCOUNTER — Encounter: Payer: Self-pay | Admitting: *Deleted

## 2018-12-30 DIAGNOSIS — I5022 Chronic systolic (congestive) heart failure: Secondary | ICD-10-CM

## 2018-12-30 NOTE — Progress Notes (Signed)
Discharge Progress Report  Patient Details  Name: Stephen [name] MRN: [id number] Date of Birth: [birth year] Referring Provider:     Cardiac Rehab from 11/10/2018 in [facility]  Referring Provider  [facility]       Number of Visits: 16  Reason for Discharge:  Early Exit:  Insurance  Smoking History:  Social History   Tobacco Use  Smoking Status Former Smoker  . Packs/day: 0.33  . Years: 33.00  . Pack years: 10.89  . Types: Cigarettes  . Last attempt to quit: 02/20/2016  . Years since quitting: 2.8  Smokeless Tobacco Never Used    Diagnosis:  No diagnosis found.  ADL UCSD:   Initial Exercise Prescription: Initial Exercise Prescription - 11/10/18 1500      Date of Initial Exercise RX and Referring Provider   Date  11/10/18    Referring Provider  [facility]      Treadmill   MPH  1.8    Grade  1.5    Minutes  15    METs  2.75      Recumbant Bike   Level  3    RPM  60    Watts  32    Minutes  15    METs  2.97      T5 Nustep   SPM  80    Minutes  15    METs  2.9      Prescription Details   Duration  Progress to 30 minutes of continuous aerobic without signs/symptoms of physical distress      Intensity   THRR 40-80% of Max Heartrate  112-148    Ratings of Perceived Exertion  11-13    Perceived Dyspnea  0-4      Resistance Training   Training Prescription  Yes    Weight  4 lb    Reps  10-15       Discharge Exercise Prescription (Final Exercise Prescription Changes): Exercise Prescription Changes - 12/10/18 1500      Response to Exercise   Blood Pressure (Admit)  86/66    Blood Pressure (Exercise)  112/78    Blood Pressure (Exit)  112/58    Heart Rate (Admit)  66 bpm    Heart Rate (Exercise)  94 bpm    Heart Rate (Exit)  73 bpm    Rating of Perceived Exertion (Exercise)  11    Symptoms  hypotension    Comments  from visit on 12/3 prior to admission on 12/4    Duration  Continue with 30 min of aerobic exercise without  signs/symptoms of physical distress.    Intensity  THRR unchanged      Progression   Progression  Continue to progress workloads to maintain intensity without signs/symptoms of physical distress.    Average METs  2.94      Resistance Training   Training Prescription  Yes    Weight  5 lbs    Reps  10-15      Interval Training   Interval Training  No      Recumbant Bike   Level  3    Minutes  15    METs  2.94      T5 Nustep   Level  4    Minutes  15       Functional Capacity: [place] Name 11/10/18 1536 12/26/18 0809       6 Minute Walk   Phase  Initial  Discharge    Distance  912 feet  1250 feet    Distance % Change  -  37.1 %    Distance Feet Change  -  338 ft    Walk Time  6 minutes  6 minutes    # of Rest Breaks  0  0    MPH  1.73  2.37    METS  2.97  3.58    RPE  14  15    Perceived Dyspnea   2  1    VO2 Peak  10.41  12.52    Symptoms  No  No    Resting HR  76 bpm  50 bpm    Resting BP  106/64  124/70    Resting Oxygen Saturation   97 %  -    Exercise Oxygen Saturation  during 6 min walk  88 %  -    Max Ex. HR  98 bpm  82 bpm    Max Ex. BP  116/68  128/70    2 Minute Post BP  126/68  -       Psychological, QOL, Others - Outcomes: PHQ 2/9: Depression screen [facility] 2/9 12/25/2018 11/10/2018 01/23/2018 10/03/2017 09/30/2017  Decreased Interest 2 3 1  0 0  Down, Depressed, Hopeless 0 0 0 0 0  PHQ - 2 Score 2 3 1  0 0  Altered sleeping 1 3 2  - -  Tired, decreased energy 1 2 1  - -  Change in appetite 0 0 1 - -  Feeling bad or failure about yourself  0 0 0 - -  Trouble concentrating 0 0 0 - -  Moving slowly or fidgety/restless 1 1 0 - -  Suicidal thoughts - 0 0 - -  PHQ-9 Score 5 9 5  - -  Difficult doing work/chores Somewhat difficult Somewhat difficult Not difficult at all - -    Quality of Life: Quality of Life - 11/10/18 1448      Quality of Life   Select  Quality of Life      Quality of Life Scores   Health/Function Pre  21.03 %     Socioeconomic Pre  25.31 %    Psych/Spiritual Pre  28.29 %    Family Pre  21.6 %    GLOBAL Pre  23.54 %       Personal Goals: Goals established at orientation with interventions provided to work toward goal. Personal Goals and Risk Factors at Admission - 11/10/18 1428      Core Components/Risk Factors/Patient Goals on Admission    Weight Management  Yes;Weight Maintenance    Intervention  Weight Management: Develop a combined nutrition and exercise program designed to reach desired caloric intake, while maintaining appropriate intake of nutrient and fiber, sodium and fats, and appropriate energy expenditure required for the weight goal.;Weight Management: Provide education and appropriate resources to help participant work on and attain dietary goals.    Admit Weight  225 lb (102.1 kg)    Heart Failure  Yes    Intervention  Provide a combined exercise and nutrition program that is supplemented with education, support and counseling about heart failure. Directed toward relieving symptoms such as shortness of breath, decreased exercise tolerance, and extremity edema.    Expected Outcomes  Improve functional capacity of life;Short term: Attendance in program 2-3 days a week with increased exercise capacity. Reported lower sodium intake. Reported increased fruit and vegetable intake. Reports medication compliance.;Short term: Daily weights obtained and reported for  increase. Utilizing diuretic protocols set by physician.;Long term: Adoption of self-care skills and reduction of barriers for early signs and symptoms recognition and intervention leading to self-care maintenance.    Lipids  Yes    Intervention  Provide education and support for participant on nutrition & aerobic/resistive exercise along with prescribed medications to achieve LDL 70mg , HDL >40mg .    Expected Outcomes  Short Term: Participant states understanding of desired cholesterol values and is compliant with medications prescribed.  Participant is following exercise prescription and nutrition guidelines.;Long Term: Cholesterol controlled with medications as prescribed, with individualized exercise RX and with personalized nutrition plan. Value goals: LDL < 70mg , HDL > 40 mg.        Personal Goals Discharge: Goals and Risk Factor Review    Row Name 12/25/18 0938             Core Components/Risk Factors/Patient Goals Review   Personal Goals Review  Weight Management/Obesity;Diabetes;Heart Failure;Hypertension;Lipids       Review  Stephen [name] has returned to rehab after being hospitalized.  He has not had any heart failure symptoms since getting discharged.  His weight is trending down and he is watching his portion sizes.  He is watching his salt intake as well.  He is doing well with his blood pressures and checks them daily.  He is doing well with his blood sugars and has been checking them daily.  He is still working on medication adjustments and currently doing well.           Exercise Goals and Review: Exercise Goals    Row Name 11/10/18 1535             Exercise Goals   Increase Physical Activity  Yes       Intervention  Provide advice, education, support and counseling about physical activity/exercise needs.;Develop an individualized exercise prescription for aerobic and resistive training based on initial evaluation findings, risk stratification, comorbidities and participant's personal goals.       Expected Outcomes  Short Term: Attend rehab on a regular basis to increase amount of physical activity.;Long Term: Add in home exercise to make exercise part of routine and to increase amount of physical activity.;Long Term: Exercising regularly at least 3-5 days a week.       Increase Strength and Stamina  Yes       Intervention  Provide advice, education, support and counseling about physical activity/exercise needs.;Develop an individualized exercise prescription for aerobic and resistive training based on initial  evaluation findings, risk stratification, comorbidities and participant's personal goals.       Expected Outcomes  Short Term: Increase workloads from initial exercise prescription for resistance, speed, and METs.;Short Term: Perform resistance training exercises routinely during rehab and add in resistance training at home;Long Term: Improve cardiorespiratory fitness, muscular endurance and strength as measured by increased METs and functional capacity (6MWT)       Able to understand and use rate of perceived exertion (RPE) scale  Yes       Intervention  Provide education and explanation on how to use RPE scale       Expected Outcomes  Short Term: Able to use RPE daily in rehab to express subjective intensity level;Long Term:  Able to use RPE to guide intensity level when exercising independently       Able to understand and use Dyspnea scale  Yes       Intervention  Provide education and explanation on how to use Dyspnea scale  Expected Outcomes  Short Term: Able to use Dyspnea scale daily in rehab to express subjective sense of shortness of breath during exertion;Long Term: Able to use Dyspnea scale to guide intensity level when exercising independently       Knowledge and understanding of Target Heart Rate Range (THRR)  Yes       Intervention  Provide education and explanation of THRR including how the numbers were predicted and where they are located for reference       Expected Outcomes  Short Term: Able to state/look up THRR;Short Term: Able to use daily as guideline for intensity in rehab;Long Term: Able to use THRR to govern intensity when exercising independently       Able to check pulse independently  Yes       Intervention  Provide education and demonstration on how to check pulse in carotid and radial arteries.;Review the importance of being able to check your own pulse for safety during independent exercise       Expected Outcomes  Short Term: Able to explain why pulse checking is  important during independent exercise;Long Term: Able to check pulse independently and accurately       Understanding of Exercise Prescription  Yes       Intervention  Provide education, explanation, and written materials on patient's individual exercise prescription       Expected Outcomes  Short Term: Able to explain program exercise prescription;Long Term: Able to explain home exercise prescription to exercise independently          Exercise Goals Re-Evaluation: Exercise Goals Re-Evaluation    Row Name 11/20/18 9518 12/10/18 1507 12/23/18 1107 12/25/18 0943       Exercise Goal Re-Evaluation   Exercise Goals Review  Increase Physical Activity;Increase Strength and Stamina;Able to understand and use rate of perceived exertion (RPE) scale;Knowledge and understanding of Target Heart Rate Range (THRR);Understanding of Exercise Prescription  Increase Physical Activity;Increase Strength and Stamina;Understanding of Exercise Prescription  -  Increase Physical Activity;Increase Strength and Stamina;Understanding of Exercise Prescription    Comments  Reviewed RPE scale, THR and program prescription with pt today.  Pt voiced understanding and was given a copy of goals to take home.   Stephen [name] has been doing fairly well in rehab. He was admitted last week with plans for d/c today or tomorrow.  He will need clearance to return.  He had increased ot level 4 on the NuStep.  We will continue to monitor his progression.   Out since last review  Stephen [name] is back to rehab.  He has been doing what he can with exercise.  He feels that his strength and stamina are doing better.  He will be graduating next week.  He is planning to go [employer] after graduation and then return to rehab with a new referral. Reviewed home exercise with pt today.  Pt plans to go to community gym and then join [employer]. for exercise.  Reviewed THR, pulse, RPE, sign and symptoms, NTG use, and when to call 911 or MD.  Also discussed weather  considerations and indoor options.  Pt voiced understanding.    Expected Outcomes  Short: Use RPE daily to regulate intensity. Long: Follow program prescription in THR.  Short: Clearance to return to rehab.  Long: Continue to follow program prescription.  -  Short: Continue to go to gym. Long: Continue to exercise independently       Nutrition & Weight - Outcomes: Pre Biometrics - 11/10/18 1534  Pre Biometrics   Height  6\' 1"  (1.854 m)    Weight  223 lb 12.8 oz (101.5 kg)    Waist Circumference  41 inches    Hip Circumference  44 inches    Waist to Hip Ratio  0.93 %    BMI (Calculated)  29.53    Single Leg Stand  12.78 seconds        Nutrition: Nutrition Therapy & Goals - 11/10/18 1452      Intervention Plan   Intervention  Prescribe, educate and counsel regarding individualized specific dietary modifications aiming towards targeted core components such as weight, hypertension, lipid management, diabetes, heart failure and other comorbidities.    Expected Outcomes  Short Term Goal: Understand basic principles of dietary content, such as calories, fat, sodium, cholesterol and nutrients.;Short Term Goal: A plan has been developed with personal nutrition goals set during dietitian appointment.;Long Term Goal: Adherence to prescribed nutrition plan.       Nutrition Discharge: Nutrition Assessments - 11/10/18 1452      MEDFICTS Scores   Pre Score  30       Education Questionnaire Score: Knowledge Questionnaire Score - 11/10/18 1444      Knowledge Questionnaire Score   Pre Score  13/26   correct answers reviewed with Stephen [name]. focus on nutrition, exercise, Angina      Goals reviewed with patient; copy given to patient.

## 2018-12-30 NOTE — Progress Notes (Signed)
Cardiac Individual Treatment Plan  Patient Details  Name: Stephen [name] MRN: [id number] Date of Birth: 12/19/1966 Referring Provider:     Cardiac Rehab from 11/10/2018 in [facility] Cardiac and Pulmonary Rehab  Referring Provider  [facility]      Initial Encounter Date:    Cardiac Rehab from 11/10/2018 in [facility] Cardiac and Pulmonary Rehab  Date  11/10/18      Visit Diagnosis: Heart failure, chronic systolic ([place])  Patient's Home Medications on Admission:  Current Outpatient Medications:  .  acetaminophen (TYLENOL) 325 MG tablet, Take 2 tablets (650 mg total) by mouth every 6 (six) hours as needed. (Patient taking differently: Take 325-650 mg by mouth every 6 (six) hours as needed for moderate pain. ), Disp: 60 tablet, Rfl: 0 .  albuterol (PROVENTIL HFA;VENTOLIN HFA) 108 (90 Base) MCG/ACT inhaler, Inhale 2 puffs into the lungs every 4 (four) hours as needed for wheezing or shortness of breath., Disp: 1 Inhaler, Rfl: 0 .  allopurinol (ZYLOPRIM) 100 MG tablet, Take 2 tablets (200 mg total) by mouth daily., Disp: 60 tablet, Rfl: 6 .  aspirin EC 81 MG EC tablet, Take 1 tablet (81 mg total) by mouth daily., Disp: 90 tablet, Rfl: 3 .  atorvastatin (LIPITOR) 40 MG tablet, Take 80 mg by mouth daily at 6 PM. , Disp: , Rfl: 6 .  CAMPHOR-EUCALYPTUS-MENTHOL EX, Apply 1 application topically as needed (congestion)., Disp: , Rfl:  .  carvedilol (COREG) 3.125 MG tablet, Take 1 tablet (3.125 mg total) by mouth 2 (two) times daily with a meal., Disp: 60 tablet, Rfl: 0 .  clopidogrel (PLAVIX) 75 MG tablet, Take 1 tablet (75 mg total) by mouth daily., Disp: 30 tablet, Rfl: 5 .  colchicine 0.6 MG tablet, Take 1 tablet (0.6 mg total) by mouth daily as needed (gout flare)., Disp: 6 tablet, Rfl: 1 .  cyclobenzaprine (FLEXERIL) 5 MG tablet, Take 1 tablet (5 mg total) by mouth 3 (three) times daily as needed (jaw pain)., Disp: 20 tablet, Rfl: 0 .  diclofenac sodium (VOLTAREN) 1 % GEL, Apply 2 g topically 4 (four)  times daily as needed (pain). , Disp: , Rfl:  .  fluticasone (FLONASE) 50 MCG/ACT nasal spray, Place 2 sprays into both nostrils daily. (Patient taking differently: Place 2 sprays into both nostrils daily as needed for allergies. ), Disp: 16 g, Rfl: 2 .  fluticasone-salmeterol (ADVAIR HFA) 115-21 MCG/ACT inhaler, Inhale 2 puffs into the lungs 2 (two) times daily., Disp: 1 Inhaler, Rfl: 12 .  meloxicam (MOBIC) 15 MG tablet, Take 15 mg by mouth daily., Disp: , Rfl:  .  Menthol, Topical Analgesic, (ICY HOT EX), Apply 1 application topically daily as needed (pain)., Disp: , Rfl:  .  methimazole (TAPAZOLE) 10 MG tablet, Take 4 tablets (40 mg total) by mouth daily., Disp: 120 tablet, Rfl: 0 .  metolazone (ZAROXOLYN) 2.5 MG tablet, Take 2.5 mg by mouth as needed. , Disp: , Rfl: 11 .  Multiple Vitamin (MULTIVITAMIN WITH MINERALS) TABS tablet, Take 1 tablet by mouth daily., Disp: , Rfl:  .  nitroGLYCERIN (NITROSTAT) 0.4 MG SL tablet, Place 1 tablet (0.4 mg total) under the tongue every 5 (five) minutes as needed for chest pain., Disp: 25 tablet, Rfl: 3 .  ondansetron (ZOFRAN ODT) 4 MG disintegrating tablet, Take 1 tablet (4 mg total) by mouth every 8 (eight) hours as needed for nausea or vomiting., Disp: 20 tablet, Rfl: 0 .  potassium chloride SA (K-DUR,KLOR-CON) 20 MEQ tablet, Take 1 tablet (20 mEq  total) by mouth 4 (four) times daily., Disp: 120 tablet, Rfl: 0 .  promethazine (PHENERGAN) 12.5 MG tablet, Take 1 tablet (12.5 mg total) by mouth every 6 (six) hours as needed for nausea or vomiting., Disp: 20 tablet, Rfl: 0 .  sacubitril-valsartan (ENTRESTO) 24-26 MG, Take 1 tablet by mouth 2 (two) times daily. (Patient taking differently: Take 0.5 tablets by mouth 2 (two) times daily. ), Disp: 60 tablet, Rfl: 0 .  sildenafil (REVATIO) 20 MG tablet, Take by mouth as needed. , Disp: , Rfl:  .  Tiotropium Bromide Monohydrate (SPIRIVA RESPIMAT) 2.5 MCG/ACT AERS, Inhale 1 puff into the lungs daily., Disp: 1 Inhaler,  Rfl: 12 .  torsemide (DEMADEX) 20 MG tablet, Take 2 tablets (40 mg total) by mouth daily. (Patient taking differently: Take 20 mg by mouth daily. Take additional 95m daily as needed), Disp: 60 tablet, Rfl: 0  Past Medical History: Past Medical History:  Diagnosis Date  . AICD (automatic cardioverter/defibrillator) present   . AKI (acute kidney injury) ([place] 12/24/2017  . Arrhythmia   . Arthritis    "lower back, knees" (06/10/2018)  . Cardiac arrest ([place] 12/23/[place]   Brief V-fib arrest  . Chest pain 09/08/2017  . CHF (congestive heart failure) (HCC)    nonischemic cardiomyopathy, EF 25%  . Gout    "on daily RX" (06/10/2018)  . Headache    "q couple months" (06/10/2018)  . High cholesterol   . History of kidney stones   . Hypertension   . Influenza A 12/24/2017  . NICM (nonischemic cardiomyopathy) ([place]   . Pneumonia 12/20/2017  . TIA (transient ischemic attack) 06/21/2016   "still affects my memory a little bit" (06/10/2018)    Tobacco Use: Social History   Tobacco Use  Smoking Status Former Smoker  . Packs/day: 0.33  . Years: 33.00  . Pack years: 10.89  . Types: Cigarettes  . Last attempt to quit: 02/20/2016  . Years since quitting: 2.8  Smokeless Tobacco Never Used    Labs: Recent Review Flowsheet Data    Labs for ITP Cardiac and Pulmonary Rehab Latest Ref Rng & Units 05/13/2018 06/16/2018 09/19/2018 12/03/2018 12/26/2018   Cholestrol 0 - 200 mg/dL 240(H) 181 184 217(H) -   LDLCALC 0 - 99 mg/dL 167(H) 123(H) 123(H) 134(H) -   HDL >40 mg/dL 49 36(L) 42 47 -   Trlycerides <150 mg/dL 122 110 94 179(H) -   Hemoglobin A1c 4.8 - 5.6 % - - - - 5.7(H)   PHART 7.350 - 7.450 - - - - -   PCO2ART 32.0 - 48.0 mmHg - - - - -   HCO3 20.0 - 28.0 mmol/L - - - - -   TCO2 22 - 32 mmol/L - - - - -   ACIDBASEDEF 0.0 - 2.0 mmol/L - - - - -   O2SAT % - - - - -       Exercise Target Goals: Exercise Program Goal: Individual exercise prescription set using results from initial 6 min  walk test and THRR while considering  patient's activity barriers and safety.   Exercise Prescription Goal: Initial exercise prescription builds to 30-45 minutes a day of aerobic activity, 2-3 days per week.  Home exercise guidelines will be given to patient during program as part of exercise prescription that the participant will acknowledge.  Activity Barriers & Risk Stratification: Activity Barriers & Cardiac Risk Stratification - 11/10/18 1453      Activity Barriers & Cardiac Risk Stratification   Activity Barriers  Joint Problems;Arthritis;Muscular Weakness    Cardiac Risk Stratification  High       6 Minute Walk: 6 Minute Walk    Row Name 11/10/18 1536 12/26/18 0809       6 Minute Walk   Phase  Initial  Discharge    Distance  912 feet  1250 feet    Distance % Change  -  37.1 %    Distance Feet Change  -  338 ft    Walk Time  6 minutes  6 minutes    # of Rest Breaks  0  0    MPH  1.73  2.37    METS  2.97  3.58    RPE  14  15    Perceived Dyspnea   2  1    VO2 Peak  10.41  12.52    Symptoms  No  No    Resting HR  76 bpm  50 bpm    Resting BP  106/64  124/70    Resting Oxygen Saturation   97 %  -    Exercise Oxygen Saturation  during 6 min walk  88 %  -    Max Ex. HR  98 bpm  82 bpm    Max Ex. BP  116/68  128/70    2 Minute Post BP  126/68  -       Oxygen Initial Assessment:   Oxygen Re-Evaluation:   Oxygen Discharge (Final Oxygen Re-Evaluation):   Initial Exercise Prescription: Initial Exercise Prescription - 11/10/18 1500      Date of Initial Exercise RX and Referring Provider   Date  11/10/18    Referring Provider  [facility]      Treadmill   MPH  1.8    Grade  1.5    Minutes  15    METs  2.75      Recumbant Bike   Level  3    RPM  60    Watts  32    Minutes  15    METs  2.97      T5 Nustep   SPM  80    Minutes  15    METs  2.9      Prescription Details   Duration  Progress to 30 minutes of continuous aerobic without signs/symptoms of  physical distress      Intensity   THRR 40-80% of Max Heartrate  112-148    Ratings of Perceived Exertion  11-13    Perceived Dyspnea  0-4      Resistance Training   Training Prescription  Yes    Weight  4 lb    Reps  10-15       Perform Capillary Blood Glucose checks as needed.  Exercise Prescription Changes: Exercise Prescription Changes    Row Name 11/10/18 1500 11/24/18 1700 12/10/18 1500         Response to Exercise   Blood Pressure (Admit)  106/64  124/66  86/66     Blood Pressure (Exercise)  116/68  134/58  112/78     Blood Pressure (Exit)  126/68  122/66  112/58     Heart Rate (Admit)  76 bpm  85 bpm  66 bpm     Heart Rate (Exercise)  98 bpm  105 bpm  94 bpm     Heart Rate (Exit)  97 bpm  78 bpm  73 bpm     Oxygen Saturation (Admit)  98 %  -  -  Oxygen Saturation (Exercise)  97 %  -  -     Oxygen Saturation (Exit)  88 %  -  -     Rating of Perceived Exertion (Exercise)  '14  12  11     ' Perceived Dyspnea (Exercise)  2  -  -     Symptoms  -  none  hypotension     Comments  -  first full day of exercise upon return to program  from visit on 12/3 prior to admission on 12/4     Duration  -  Continue with 30 min of aerobic exercise without signs/symptoms of physical distress.  Continue with 30 min of aerobic exercise without signs/symptoms of physical distress.     Intensity  -  THRR unchanged  THRR unchanged       Progression   Progression  -  Continue to progress workloads to maintain intensity without signs/symptoms of physical distress.  Continue to progress workloads to maintain intensity without signs/symptoms of physical distress.     Average METs  -  2.63  2.94       Resistance Training   Training Prescription  -  Yes  Yes     Weight  -  4 lbs  5 lbs     Reps  -  10-15  10-15       Interval Training   Interval Training  -  No  No       Treadmill   MPH  -  1.8  -     Grade  -  1  -     Minutes  -  15  -     METs  -  2.63  -       Recumbant Bike    Level  -  3  3     Minutes  -  15  15     METs  -  -  2.94       T5 Nustep   Level  -  -  4     Minutes  -  -  15        Exercise Comments: Exercise Comments    Row Name 11/20/18 0915           Exercise Comments   First full day of exercise with returning!  Patient was oriented to gym and equipment including functions, settings, policies, and procedures.  Patient's individual exercise prescription and treatment plan were reviewed.  All starting workloads were established based on the results of the 6 minute walk test done at initial orientation visit.  The plan for exercise progression was also introduced and progression will be customized based on patient's performance and goals.          Exercise Goals and Review: Exercise Goals    Row Name 11/10/18 1535             Exercise Goals   Increase Physical Activity  Yes       Intervention  Provide advice, education, support and counseling about physical activity/exercise needs.;Develop an individualized exercise prescription for aerobic and resistive training based on initial evaluation findings, risk stratification, comorbidities and participant's personal goals.       Expected Outcomes  Short Term: Attend rehab on a regular basis to increase amount of physical activity.;Long Term: Add in home exercise to make exercise part of routine and to increase amount of physical activity.;Long Term: Exercising regularly at least 3-5 days a week.  Increase Strength and Stamina  Yes       Intervention  Provide advice, education, support and counseling about physical activity/exercise needs.;Develop an individualized exercise prescription for aerobic and resistive training based on initial evaluation findings, risk stratification, comorbidities and participant's personal goals.       Expected Outcomes  Short Term: Increase workloads from initial exercise prescription for resistance, speed, and METs.;Short Term: Perform resistance training  exercises routinely during rehab and add in resistance training at home;Long Term: Improve cardiorespiratory fitness, muscular endurance and strength as measured by increased METs and functional capacity (6MWT)       Able to understand and use rate of perceived exertion (RPE) scale  Yes       Intervention  Provide education and explanation on how to use RPE scale       Expected Outcomes  Short Term: Able to use RPE daily in rehab to express subjective intensity level;Long Term:  Able to use RPE to guide intensity level when exercising independently       Able to understand and use Dyspnea scale  Yes       Intervention  Provide education and explanation on how to use Dyspnea scale       Expected Outcomes  Short Term: Able to use Dyspnea scale daily in rehab to express subjective sense of shortness of breath during exertion;Long Term: Able to use Dyspnea scale to guide intensity level when exercising independently       Knowledge and understanding of Target Heart Rate Range (THRR)  Yes       Intervention  Provide education and explanation of THRR including how the numbers were predicted and where they are located for reference       Expected Outcomes  Short Term: Able to state/look up THRR;Short Term: Able to use daily as guideline for intensity in rehab;Long Term: Able to use THRR to govern intensity when exercising independently       Able to check pulse independently  Yes       Intervention  Provide education and demonstration on how to check pulse in carotid and radial arteries.;Review the importance of being able to check your own pulse for safety during independent exercise       Expected Outcomes  Short Term: Able to explain why pulse checking is important during independent exercise;Long Term: Able to check pulse independently and accurately       Understanding of Exercise Prescription  Yes       Intervention  Provide education, explanation, and written materials on patient's individual exercise  prescription       Expected Outcomes  Short Term: Able to explain program exercise prescription;Long Term: Able to explain home exercise prescription to exercise independently          Exercise Goals Re-Evaluation : Exercise Goals Re-Evaluation    Row Name 11/20/18 7867 12/10/18 1507 12/23/18 1107 12/25/18 0943       Exercise Goal Re-Evaluation   Exercise Goals Review  Increase Physical Activity;Increase Strength and Stamina;Able to understand and use rate of perceived exertion (RPE) scale;Knowledge and understanding of Target Heart Rate Range (THRR);Understanding of Exercise Prescription  Increase Physical Activity;Increase Strength and Stamina;Understanding of Exercise Prescription  -  Increase Physical Activity;Increase Strength and Stamina;Understanding of Exercise Prescription    Comments  Reviewed RPE scale, THR and program prescription with pt today.  Pt voiced understanding and was given a copy of goals to take home.   Stephen [name] has been doing fairly well in  rehab. He was admitted last week with plans for d/c today or tomorrow.  He will need clearance to return.  He had increased ot level 4 on the NuStep.  We will continue to monitor his progression.   Out since last review  [name] is back to rehab.  He has been doing what he can with exercise.  He feels that his strength and stamina are doing better.  He will be graduating next week.  He is planning to go [employer] after graduation and then return to rehab with a new referral. Reviewed home exercise with pt today.  Pt plans to go to community gym and then join [employer]. for exercise.  Reviewed THR, pulse, RPE, sign and symptoms, NTG use, and when to call 911 or MD.  Also discussed weather considerations and indoor options.  Pt voiced understanding.    Expected Outcomes  Short: Use RPE daily to regulate intensity. Long: Follow program prescription in THR.  Short: Clearance to return to rehab.  Long: Continue to follow program prescription.  -   Short: Continue to go to gym. Long: Continue to exercise independently       Discharge Exercise Prescription (Final Exercise Prescription Changes): Exercise Prescription Changes - 12/10/18 1500      Response to Exercise   Blood Pressure (Admit)  86/66    Blood Pressure (Exercise)  112/78    Blood Pressure (Exit)  112/58    Heart Rate (Admit)  66 bpm    Heart Rate (Exercise)  94 bpm    Heart Rate (Exit)  73 bpm    Rating of Perceived Exertion (Exercise)  11    Symptoms  hypotension    Comments  from visit on 12/3 prior to admission on 12/4    Duration  Continue with 30 min of aerobic exercise without signs/symptoms of physical distress.    Intensity  THRR unchanged      Progression   Progression  Continue to progress workloads to maintain intensity without signs/symptoms of physical distress.    Average METs  2.94      Resistance Training   Training Prescription  Yes    Weight  5 lbs    Reps  10-15      Interval Training   Interval Training  No      Recumbant Bike   Level  3    Minutes  15    METs  2.94      T5 Nustep   Level  4    Minutes  15       Nutrition:  Target Goals: Understanding of nutrition guidelines, daily intake of sodium <1550m, cholesterol <2060m calories 30% from fat and 7% or less from saturated fats, daily to have 5 or more servings of fruits and vegetables.  Biometrics: Pre Biometrics - 11/10/18 1534      Pre Biometrics   Height  '6\' 1"'  (1.854 m)    Weight  223 lb 12.8 oz (101.5 kg)    Waist Circumference  41 inches    Hip Circumference  44 inches    Waist to Hip Ratio  0.93 %    BMI (Calculated)  29.53    Single Leg Stand  12.78 seconds        Nutrition Therapy Plan and Nutrition Goals: Nutrition Therapy & Goals - 11/10/18 1452      Intervention Plan   Intervention  Prescribe, educate and counsel regarding individualized specific dietary modifications aiming towards targeted core components such  as weight, hypertension, lipid  management, diabetes, heart failure and other comorbidities.    Expected Outcomes  Short Term Goal: Understand basic principles of dietary content, such as calories, fat, sodium, cholesterol and nutrients.;Short Term Goal: A plan has been developed with personal nutrition goals set during dietitian appointment.;Long Term Goal: Adherence to prescribed nutrition plan.       Nutrition Assessments: Nutrition Assessments - 11/10/18 1452      MEDFICTS Scores   Pre Score  30       Nutrition Goals Re-Evaluation: Nutrition Goals Re-Evaluation    [place] Name 12/25/18 0941             Goals   Nutrition Goal  Stick to heart healthy diet high in vegetables and lean meat.        Comment  [name] says he has been eating like a rabbit. He is also grilling his chicken with just some garlic.  He continues to stay away from fried foods.  He is not snacking as much either.  He continues to use the food tracker as well.        Expected Outcome  Short: Continue to work on heart healthy diet.  Long: Continue to follow diet.           Nutrition Goals Discharge (Final Nutrition Goals Re-Evaluation): Nutrition Goals Re-Evaluation - 12/25/18 0941      Goals   Nutrition Goal  Stick to heart healthy diet high in vegetables and lean meat.     Comment  [name] says he has been eating like a rabbit. He is also grilling his chicken with just some garlic.  He continues to stay away from fried foods.  He is not snacking as much either.  He continues to use the food tracker as well.     Expected Outcome  Short: Continue to work on heart healthy diet.  Long: Continue to follow diet.        Psychosocial: Target Goals: Acknowledge presence or absence of significant depression and/or stress, maximize coping skills, provide positive support system. Participant is able to verbalize types and ability to use techniques and skills needed for reducing stress and depression.   Initial Review & Psychosocial Screening: Initial Psych  Review & Screening - 11/10/18 1446      Initial Review   Current issues with  Current Sleep Concerns;Current Stress Concerns   issues with shortness of breath while sleeping   Source of Stress Concerns  Transportation;Unable to perform yard/household activities      [place]?  Yes   family, friends     Screening Interventions   Interventions  Encouraged to exercise;Program counselor consult;To provide support and resources with identified psychosocial needs;Provide feedback about the scores to participant    Expected Outcomes  Short Term goal: Utilizing psychosocial counselor, staff and physician to assist with identification of specific Stressors or current issues interfering with healing process. Setting desired goal for each stressor or current issue identified.;Long Term Goal: Stressors or current issues are controlled or eliminated.;Short Term goal: Identification and review with participant of any Quality of Life or Depression concerns found by scoring the questionnaire.;Long Term goal: The participant improves quality of Life and PHQ9 Scores as seen by post scores and/or verbalization of changes       Quality of Life Scores:  Quality of Life - 11/10/18 1448      Quality of Life   Select  Quality of Life  Quality of Life Scores   Health/Function Pre  21.03 %    Socioeconomic Pre  25.31 %    Psych/Spiritual Pre  28.29 %    Family Pre  21.6 %    GLOBAL Pre  23.54 %      Scores of 19 and below usually indicate a poorer quality of life in these areas.  A difference of  2-3 points is a clinically meaningful difference.  A difference of 2-3 points in the total score of the Quality of Life Index has been associated with significant improvement in overall quality of life, self-image, physical symptoms, and general health in studies assessing change in quality of life.  PHQ-9: Recent Review Flowsheet Data    Depression screen [facility] 2/9 12/25/2018  11/10/2018 01/23/2018 10/03/2017 09/30/2017   Decreased Interest '2 3 1 ' 0 0   Down, Depressed, Hopeless 0 0 0 0 0   PHQ - 2 Score '2 3 1 ' 0 0   Altered sleeping '1 3 2 ' - -   Tired, decreased energy '1 2 1 ' - -   Change in appetite 0 0 1 - -   Feeling bad or failure about yourself  0 0 0 - -   Trouble concentrating 0 0 0 - -   Moving slowly or fidgety/restless 1 1 0 - -   Suicidal thoughts - 0 0 - -   PHQ-9 Score '5 9 5 ' - -   Difficult doing work/chores Somewhat difficult Somewhat difficult Not difficult at all - -     Interpretation of Total Score  Total Score Depression Severity:  1-4 = Minimal depression, 5-9 = Mild depression, 10-14 = Moderate depression, 15-19 = Moderately severe depression, 20-27 = Severe depression   Psychosocial Evaluation and Intervention: Psychosocial Evaluation - 12/25/18 1009      Psychosocial Evaluation & Interventions   Interventions  Stress management education;Encouraged to exercise with the program and follow exercise prescription    Comments  Counselor met with Mr. Winski (Stephen [name]) today for initial psychosocial evaluation.  He is a 52 year old who has Congestive Heart failure and was recently in the hospital with pneumonia.  He lives alone and has a strong support system with a daughter, sister and family locally as well as active involvement in his local church.  Stephen [name] has some chronic knee pain and states he needs surgery but his heart is not "strong enough" to do this yet.  He sleeps well and has a good appetite.  Stephen [name] denies a history of depression or anxiety or any current symptoms and states he is typically in a positive mood.  He has some financial stress currently as he is applying for SS disability as his health prevents him from working.  He has goals to get back to 100% functioning and be strong enough for surgery on his knees.  He will be followed by staff.      Expected Outcomes  Short:  Stephen [name] will exercise consistently to regain his strength and be able to  eventually have surgery on his knees.  Long:  Stephen [name] will exercise for his health and his mental health on a regular basis.      Continue Psychosocial Services   Follow up required by staff       Psychosocial Re-Evaluation:   Psychosocial Discharge (Final Psychosocial Re-Evaluation):   Vocational Rehabilitation: Provide vocational rehab assistance to qualifying candidates.   Vocational Rehab Evaluation & Intervention: Vocational Rehab - 11/10/18 1446      Initial  Vocational Rehab Evaluation & Intervention   Assessment shows need for Vocational Rehabilitation  No       Education: Education Goals: Education classes will be provided on a variety of topics geared toward better understanding of heart health and risk factor modification. Participant will state understanding/return demonstration of topics presented as noted by education test scores.  Learning Barriers/Preferences: Learning Barriers/Preferences - 11/10/18 1431      Learning Barriers/Preferences   Learning Barriers  None    Learning Preferences  Group Instruction;Skilled Demonstration       Education Topics:  AED/CPR: - Group verbal and written instruction with the use of models to demonstrate the basic use of the AED with the basic ABC's of resuscitation.   Cardiac Rehab from 03/13/2018 in [facility] Cardiac and Pulmonary Rehab  Date  02/11/18  Educator  SB  Instruction Review Code  1- Verbalizes Understanding      General Nutrition Guidelines/Fats and Fiber: -Group instruction provided by verbal, written material, models and posters to present the general guidelines for heart healthy nutrition. Gives an explanation and review of dietary fats and fiber.   Controlling Sodium/Reading Food Labels: -Group verbal and written material supporting the discussion of sodium use in heart healthy nutrition. Review and explanation with models, verbal and written materials for utilization of the food label.   Cardiac Rehab from  03/13/2018 in [facility] Cardiac and Pulmonary Rehab  Date  02/04/18  Educator  CR  Instruction Review Code  1- Verbalizes Understanding      Exercise Physiology & General Exercise Guidelines: - Group verbal and written instruction with models to review the exercise physiology of the cardiovascular system and associated critical values. Provides general exercise guidelines with specific guidelines to those with heart or lung disease.    Cardiac Rehab from 03/13/2018 in [facility] Cardiac and Pulmonary Rehab  Date  02/20/18  Educator  [facility]  Instruction Review Code  1- Verbalizes Understanding      Aerobic Exercise & Resistance Training: - Gives group verbal and written instruction on the various components of exercise. Focuses on aerobic and resistive training programs and the benefits of this training and how to safely progress through these programs..   Cardiac Rehab from 12/02/2018 in [facility] Cardiac and Pulmonary Rehab  Date  11/20/18  Educator  AS  Instruction Review Code  1- Verbalizes Understanding      Flexibility, Balance, Mind/Body Relaxation: Provides group verbal/written instruction on the benefits of flexibility and balance training, including mind/body exercise modes such as yoga, pilates and tai chi.  Demonstration and skill practice provided.   Stress and Anxiety: - Provides group verbal and written instruction about the health risks of elevated stress and causes of high stress.  Discuss the correlation between heart/lung disease and anxiety and treatment options. Review healthy ways to manage with stress and anxiety.   Cardiac Rehab from 03/13/2018 in [facility] Cardiac and Pulmonary Rehab  Date  03/04/18  Educator  [facility]  Instruction Review Code  1- Verbalizes Understanding      Depression: - Provides group verbal and written instruction on the correlation between heart/lung disease and depressed mood, treatment options, and the stigmas associated with seeking treatment.   Cardiac Rehab  from 03/13/2018 in [facility] Cardiac and Pulmonary Rehab  Date  02/18/18  Educator  [facility]  Instruction Review Code  1- Verbalizes Understanding      Anatomy & Physiology of the Heart: - Group verbal and written instruction and models provide basic cardiac anatomy and physiology, with  the coronary electrical and arterial systems. Review of Valvular disease and Heart Failure   Cardiac Procedures: - Group verbal and written instruction to review commonly prescribed medications for heart disease. Reviews the medication, class of the drug, and side effects. Includes the steps to properly store meds and maintain the prescription regimen. (beta blockers and nitrates)   Cardiac Medications I: - Group verbal and written instruction to review commonly prescribed medications for heart disease. Reviews the medication, class of the drug, and side effects. Includes the steps to properly store meds and maintain the prescription regimen.   Cardiac Rehab from 03/13/2018 in [facility] Cardiac and Pulmonary Rehab  Date  03/11/18  Educator  SB  Instruction Review Code  1- Verbalizes Understanding      Cardiac Medications II: -Group verbal and written instruction to review commonly prescribed medications for heart disease. Reviews the medication, class of the drug, and side effects. (all other drug classes)   Cardiac Rehab from 12/02/2018 in [facility] Cardiac and Pulmonary Rehab  Date  12/02/18  Educator  SB  Instruction Review Code  1- Verbalizes Understanding       Go Sex-Intimacy & Heart Disease, Get SMART - Goal Setting: - Group verbal and written instruction through game format to discuss heart disease and the return to sexual intimacy. Provides group verbal and written material to discuss and apply goal setting through the application of the S.M.A.R.T. Method.   Other Matters of the Heart: - Provides group verbal, written materials and models to describe Stable Angina and Peripheral Artery. Includes description of  the disease process and treatment options available to the cardiac patient.   Exercise & Equipment Safety: - Individual verbal instruction and demonstration of equipment use and safety with use of the equipment.   Cardiac Rehab from 12/02/2018 in [facility] Cardiac and Pulmonary Rehab  Date  11/10/18  Educator  [facility]  Instruction Review Code  1- Verbalizes Understanding      Infection Prevention: - Provides verbal and written material to individual with discussion of infection control including proper hand washing and proper equipment cleaning during exercise session.   Cardiac Rehab from 12/02/2018 in [facility] Cardiac and Pulmonary Rehab  Date  11/10/18  Educator  [facility]  Instruction Review Code  1- Verbalizes Understanding      Falls Prevention: - Provides verbal and written material to individual with discussion of falls prevention and safety.   Cardiac Rehab from 12/02/2018 in [facility] Cardiac and Pulmonary Rehab  Date  11/10/18  Educator  [facility]  Instruction Review Code  1- Verbalizes Understanding      Diabetes: - Individual verbal and written instruction to review signs/symptoms of diabetes, desired ranges of glucose level fasting, after meals and with exercise. Acknowledge that pre and post exercise glucose checks will be done for 3 sessions at entry of program.   Know Your Numbers and Risk Factors: -Group verbal and written instruction about important numbers in your health.  Discussion of what are risk factors and how they play a role in the disease process.  Review of Cholesterol, Blood Pressure, Diabetes, and BMI and the role they play in your overall health.   Cardiac Rehab from 12/02/2018 in [facility] Cardiac and Pulmonary Rehab  Date  12/02/18  Educator  SB  Instruction Review Code  1- Verbalizes Understanding      Sleep Hygiene: -Provides group verbal and written instruction about how sleep can affect your health.  Define sleep hygiene, discuss sleep cycles and impact of sleep habits.  Review good sleep hygiene tips.    Cardiac Rehab from 03/13/2018 in [facility] Cardiac and Pulmonary Rehab  Date  01/30/18  Educator  [facility]  Instruction Review Code  1- Verbalizes Understanding      Other: -Provides group and verbal instruction on various topics (see comments)   Knowledge Questionnaire Score: Knowledge Questionnaire Score - 11/10/18 1444      Knowledge Questionnaire Score   Pre Score  13/26   correct answers reviewed with [name]. focus on nutrition, exercise, Angina      Core Components/Risk Factors/Patient Goals at Admission: Personal Goals and Risk Factors at Admission - 11/10/18 1428      Core Components/Risk Factors/Patient Goals on Admission    Weight Management  Yes;Weight Maintenance    Intervention  Weight Management: Develop a combined nutrition and exercise program designed to reach desired caloric intake, while maintaining appropriate intake of nutrient and fiber, sodium and fats, and appropriate energy expenditure required for the weight goal.;Weight Management: Provide education and appropriate resources to help participant work on and attain dietary goals.    Admit Weight  225 lb (102.1 kg)    Heart Failure  Yes    Intervention  Provide a combined exercise and nutrition program that is supplemented with education, support and counseling about heart failure. Directed toward relieving symptoms such as shortness of breath, decreased exercise tolerance, and extremity edema.    Expected Outcomes  Improve functional capacity of life;Short term: Attendance in program 2-3 days a week with increased exercise capacity. Reported lower sodium intake. Reported increased fruit and vegetable intake. Reports medication compliance.;Short term: Daily weights obtained and reported for increase. Utilizing diuretic protocols set by physician.;Long term: Adoption of self-care skills and reduction of barriers for early signs and symptoms recognition and intervention leading to self-care  maintenance.    Lipids  Yes    Intervention  Provide education and support for participant on nutrition & aerobic/resistive exercise along with prescribed medications to achieve LDL <29m, HDL >475m    Expected Outcomes  Short Term: Participant states understanding of desired cholesterol values and is compliant with medications prescribed. Participant is following exercise prescription and nutrition guidelines.;Long Term: Cholesterol controlled with medications as prescribed, with individualized exercise RX and with personalized nutrition plan. Value goals: LDL < 7032mHDL > 40 mg.       Core Components/Risk Factors/Patient Goals Review:  Goals and Risk Factor Review    Row Name 12/25/18 [id number]          Core Components/Risk Factors/Patient Goals Review   Personal Goals Review  Weight Management/Obesity;Diabetes;Heart Failure;Hypertension;Lipids       Review  TimOctavia Bruckners returned to rehab after being hospitalized.  He has not had any heart failure symptoms since getting discharged.  His weight is trending down and he is watching his portion sizes.  He is watching his salt intake as well.  He is doing well with his blood pressures and checks them daily.  He is doing well with his blood sugars and has been checking them daily.  He is still working on medication adjustments and currently doing well.           Core Components/Risk Factors/Patient Goals at Discharge (Final Review):  Goals and Risk Factor Review - 12/25/18 0938      Core Components/Risk Factors/Patient Goals Review   Personal Goals Review  Weight Management/Obesity;Diabetes;Heart Failure;Hypertension;Lipids    Review  TimOctavia Bruckners returned to rehab after being hospitalized.  He has not had any  heart failure symptoms since getting discharged.  His weight is trending down and he is watching his portion sizes.  He is watching his salt intake as well.  He is doing well with his blood pressures and checks them daily.  He is doing well with  his blood sugars and has been checking them daily.  He is still working on medication adjustments and currently doing well.        ITP Comments: ITP Comments    Row Name 11/10/18 1400 12/03/18 0617 12/10/18 1506 12/23/18 1106 12/30/18 0640   ITP Comments  Med Review completed. Initial ITP created. Diagnosis can be found in [place] Visit 10/30  30 day review. Continue with ITP unless direccted changes per Medical Director Chart Review. New to program  Stephen [name] was admitted to hospital on 12/4 with PNA and worsening heart failure.  He called today to let us know that they are planning to discharge him tonight or tomorrow.  He is planning to stay out next week with follow up and hopes to return the week of Christmas.  We told him that he will need clearance from the doctor to return to rehab.   He voiced understanding.   Cleared to return to rehab on 12/25/18.  Will graduate on 12/31 due to insurance.   30 Day Review. Continue with ITP unless directed changes per Medical Director review      Comments:

## 2018-12-30 NOTE — Progress Notes (Signed)
Cardiac Individual Treatment Plan  Patient Details  Name: Stephen [name] MRN: [id number] Date of Birth: 07/10/1966 Referring Provider:     Cardiac Rehab from 11/10/2018 in [facility] Cardiac and Pulmonary Rehab  Referring Provider  [facility]      Initial Encounter Date:    Cardiac Rehab from 11/10/2018 in [facility] Cardiac and Pulmonary Rehab  Date  11/10/18      Visit Diagnosis: No diagnosis found.  Patient's Home Medications on Admission:  Current Outpatient Medications:  .  acetaminophen (TYLENOL) 325 MG tablet, Take 2 tablets (650 mg total) by mouth every 6 (six) hours as needed. (Patient taking differently: Take 325-650 mg by mouth every 6 (six) hours as needed for moderate pain. ), Disp: 60 tablet, Rfl: 0 .  albuterol (PROVENTIL HFA;VENTOLIN HFA) 108 (90 Base) MCG/ACT inhaler, Inhale 2 puffs into the lungs every 4 (four) hours as needed for wheezing or shortness of breath., Disp: 1 Inhaler, Rfl: 0 .  allopurinol (ZYLOPRIM) 100 MG tablet, Take 2 tablets (200 mg total) by mouth daily., Disp: 60 tablet, Rfl: 6 .  aspirin EC 81 MG EC tablet, Take 1 tablet (81 mg total) by mouth daily., Disp: 90 tablet, Rfl: 3 .  atorvastatin (LIPITOR) 40 MG tablet, Take 80 mg by mouth daily at 6 PM. , Disp: , Rfl: 6 .  CAMPHOR-EUCALYPTUS-MENTHOL EX, Apply 1 application topically as needed (congestion)., Disp: , Rfl:  .  carvedilol (COREG) 3.125 MG tablet, Take 1 tablet (3.125 mg total) by mouth 2 (two) times daily with a meal., Disp: 60 tablet, Rfl: 0 .  clopidogrel (PLAVIX) 75 MG tablet, Take 1 tablet (75 mg total) by mouth daily., Disp: 30 tablet, Rfl: 5 .  colchicine 0.6 MG tablet, Take 1 tablet (0.6 mg total) by mouth daily as needed (gout flare)., Disp: 6 tablet, Rfl: 1 .  cyclobenzaprine (FLEXERIL) 5 MG tablet, Take 1 tablet (5 mg total) by mouth 3 (three) times daily as needed (jaw pain)., Disp: 20 tablet, Rfl: 0 .  diclofenac sodium (VOLTAREN) 1 % GEL, Apply 2 g topically 4 (four) times daily as  needed (pain). , Disp: , Rfl:  .  fluticasone (FLONASE) 50 MCG/ACT nasal spray, Place 2 sprays into both nostrils daily. (Patient taking differently: Place 2 sprays into both nostrils daily as needed for allergies. ), Disp: 16 g, Rfl: 2 .  fluticasone-salmeterol (ADVAIR HFA) 115-21 MCG/ACT inhaler, Inhale 2 puffs into the lungs 2 (two) times daily., Disp: 1 Inhaler, Rfl: 12 .  meloxicam (MOBIC) 15 MG tablet, Take 15 mg by mouth daily., Disp: , Rfl:  .  Menthol, Topical Analgesic, (ICY HOT EX), Apply 1 application topically daily as needed (pain)., Disp: , Rfl:  .  methimazole (TAPAZOLE) 10 MG tablet, Take 4 tablets (40 mg total) by mouth daily., Disp: 120 tablet, Rfl: 0 .  metolazone (ZAROXOLYN) 2.5 MG tablet, Take 2.5 mg by mouth as needed. , Disp: , Rfl: 11 .  Multiple Vitamin (MULTIVITAMIN WITH MINERALS) TABS tablet, Take 1 tablet by mouth daily., Disp: , Rfl:  .  nitroGLYCERIN (NITROSTAT) 0.4 MG SL tablet, Place 1 tablet (0.4 mg total) under the tongue every 5 (five) minutes as needed for chest pain., Disp: 25 tablet, Rfl: 3 .  ondansetron (ZOFRAN ODT) 4 MG disintegrating tablet, Take 1 tablet (4 mg total) by mouth every 8 (eight) hours as needed for nausea or vomiting., Disp: 20 tablet, Rfl: 0 .  potassium chloride SA (K-DUR,KLOR-CON) 20 MEQ tablet, Take 1 tablet (20 mEq total) by  mouth 4 (four) times daily., Disp: 120 tablet, Rfl: 0 .  promethazine (PHENERGAN) 12.5 MG tablet, Take 1 tablet (12.5 mg total) by mouth every 6 (six) hours as needed for nausea or vomiting., Disp: 20 tablet, Rfl: 0 .  sacubitril-valsartan (ENTRESTO) 24-26 MG, Take 1 tablet by mouth 2 (two) times daily. (Patient taking differently: Take 0.5 tablets by mouth 2 (two) times daily. ), Disp: 60 tablet, Rfl: 0 .  sildenafil (REVATIO) 20 MG tablet, Take by mouth as needed. , Disp: , Rfl:  .  Tiotropium Bromide Monohydrate (SPIRIVA RESPIMAT) 2.5 MCG/ACT AERS, Inhale 1 puff into the lungs daily., Disp: 1 Inhaler, Rfl: 12 .   torsemide (DEMADEX) 20 MG tablet, Take 2 tablets (40 mg total) by mouth daily. (Patient taking differently: Take 20 mg by mouth daily. Take additional 81m daily as needed), Disp: 60 tablet, Rfl: 0  Past Medical History: Past Medical History:  Diagnosis Date  . AICD (automatic cardioverter/defibrillator) present   . AKI (acute kidney injury) ([facility] 12/24/2017  . Arrhythmia   . Arthritis    "lower back, knees" (06/10/2018)  . [place]23[place]   Brief V-fib arrest  . Chest pain 09/08/2017  . CHF (congestive heart failure) (HCC)    nonischemic cardiomyopathy, EF 25%  . Gout    "on daily RX" (06/10/2018)  . Headache    "q couple months" (06/10/2018)  . High cholesterol   . History of kidney stones   . Hypertension   . Influenza A 12/24/2017  . [place]   . Pneumonia 12/20/2017  . TIA (transient ischemic attack) 06/21/2016   "still affects my memory a little bit" (06/10/2018)    Tobacco Use: Social History   Tobacco Use  Smoking Status Former Smoker  . Packs/day: 0.33  . Years: 33.00  . Pack years: 10.89  . Types: Cigarettes  . Last attempt to quit: 02/20/2016  . Years since quitting: 2.8  Smokeless Tobacco Never Used    Labs: Recent Review Flowsheet Data    Labs for ITP Cardiac and Pulmonary Rehab Latest Ref Rng & Units 05/13/2018 06/16/2018 09/19/2018 12/03/2018 12/26/2018   Cholestrol 0 - 200 mg/dL 240(H) 181 184 217(H) -   LDLCALC 0 - 99 mg/dL 167(H) 123(H) 123(H) 134(H) -   HDL >40 mg/dL 49 36(L) 42 47 -   Trlycerides <150 mg/dL 122 110 94 179(H) -   Hemoglobin A1c 4.8 - 5.6 % - - - - 5.7(H)   PHART 7.350 - 7.450 - - - - -   PCO2ART 32.0 - 48.0 mmHg - - - - -   HCO3 20.0 - 28.0 mmol/L - - - - -   TCO2 22 - 32 mmol/L - - - - -   ACIDBASEDEF 0.0 - 2.0 mmol/L - - - - -   O2SAT % - - - - -       Exercise Target Goals: Exercise Program Goal: Individual exercise prescription set using results from initial 6 min walk test and  THRR while considering  patient's activity barriers and safety.   Exercise Prescription Goal: Initial exercise prescription builds to 30-45 minutes a day of aerobic activity, 2-3 days per week.  Home exercise guidelines will be given to patient during program as part of exercise prescription that the participant will acknowledge.  Activity Barriers & Risk Stratification: Activity Barriers & Cardiac Risk Stratification - 11/10/18 1453      Activity Barriers & Cardiac Risk Stratification   Activity Barriers  Joint  Problems;Arthritis;Muscular Weakness    Cardiac Risk Stratification  High       6 Minute Walk: 6 Minute Walk    Row Name 11/10/18 1536 12/26/18 0809       6 Minute Walk   Phase  Initial  Discharge    Distance  912 feet  1250 feet    Distance % Change  -  37.1 %    Distance Feet Change  -  338 ft    Walk Time  6 minutes  6 minutes    # of Rest Breaks  0  0    MPH  1.73  2.37    METS  2.97  3.58    RPE  14  15    Perceived Dyspnea   2  1    VO2 Peak  10.41  12.52    Symptoms  No  No    Resting HR  76 bpm  50 bpm    Resting BP  106/64  124/70    Resting Oxygen Saturation   97 %  -    Exercise Oxygen Saturation  during 6 min walk  88 %  -    Max Ex. HR  98 bpm  82 bpm    Max Ex. BP  116/68  128/70    2 Minute Post BP  126/68  -       Oxygen Initial Assessment:   Oxygen Re-Evaluation:   Oxygen Discharge (Final Oxygen Re-Evaluation):   Initial Exercise Prescription: Initial Exercise Prescription - 11/10/18 1500      Date of Initial Exercise RX and Referring Provider   Date  11/10/18    Referring Provider  [facility]      Treadmill   MPH  1.8    Grade  1.5    Minutes  15    METs  2.75      Recumbant Bike   Level  3    RPM  60    Watts  32    Minutes  15    METs  2.97      T5 Nustep   SPM  80    Minutes  15    METs  2.9      Prescription Details   Duration  Progress to 30 minutes of continuous aerobic without signs/symptoms of physical  distress      Intensity   THRR 40-80% of Max Heartrate  112-148    Ratings of Perceived Exertion  11-13    Perceived Dyspnea  0-4      Resistance Training   Training Prescription  Yes    Weight  4 lb    Reps  10-15       Perform Capillary Blood Glucose checks as needed.  Exercise Prescription Changes: Exercise Prescription Changes    Row Name 11/10/18 1500 11/24/18 1700 12/10/18 1500         Response to Exercise   Blood Pressure (Admit)  106/64  124/66  86/66     Blood Pressure (Exercise)  116/68  134/58  112/78     Blood Pressure (Exit)  126/68  122/66  112/58     Heart Rate (Admit)  76 bpm  85 bpm  66 bpm     Heart Rate (Exercise)  98 bpm  105 bpm  94 bpm     Heart Rate (Exit)  97 bpm  78 bpm  73 bpm     Oxygen Saturation (Admit)  98 %  -  -  Oxygen Saturation (Exercise)  97 %  -  -     Oxygen Saturation (Exit)  88 %  -  -     Rating of Perceived Exertion (Exercise)  '14  12  11     ' Perceived Dyspnea (Exercise)  2  -  -     Symptoms  -  none  hypotension     Comments  -  first full day of exercise upon return to program  from visit on 12/3 prior to admission on 12/4     Duration  -  Continue with 30 min of aerobic exercise without signs/symptoms of physical distress.  Continue with 30 min of aerobic exercise without signs/symptoms of physical distress.     Intensity  -  THRR unchanged  THRR unchanged       Progression   Progression  -  Continue to progress workloads to maintain intensity without signs/symptoms of physical distress.  Continue to progress workloads to maintain intensity without signs/symptoms of physical distress.     Average METs  -  2.63  2.94       Resistance Training   Training Prescription  -  Yes  Yes     Weight  -  4 lbs  5 lbs     Reps  -  10-15  10-15       Interval Training   Interval Training  -  No  No       Treadmill   MPH  -  1.8  -     Grade  -  1  -     Minutes  -  15  -     METs  -  2.63  -       Recumbant Bike   Level  -   3  3     Minutes  -  15  15     METs  -  -  2.94       T5 Nustep   Level  -  -  4     Minutes  -  -  15        Exercise Comments: Exercise Comments    Row Name 11/20/18 0915           Exercise Comments   First full day of exercise with returning!  Patient was oriented to gym and equipment including functions, settings, policies, and procedures.  Patient's individual exercise prescription and treatment plan were reviewed.  All starting workloads were established based on the results of the 6 minute walk test done at initial orientation visit.  The plan for exercise progression was also introduced and progression will be customized based on patient's performance and goals.          Exercise Goals and Review: Exercise Goals    Row Name 11/10/18 1535             Exercise Goals   Increase Physical Activity  Yes       Intervention  Provide advice, education, support and counseling about physical activity/exercise needs.;Develop an individualized exercise prescription for aerobic and resistive training based on initial evaluation findings, risk stratification, comorbidities and participant's personal goals.       Expected Outcomes  Short Term: Attend rehab on a regular basis to increase amount of physical activity.;Long Term: Add in home exercise to make exercise part of routine and to increase amount of physical activity.;Long Term: Exercising regularly at least 3-5 days a week.  Increase Strength and Stamina  Yes       Intervention  Provide advice, education, support and counseling about physical activity/exercise needs.;Develop an individualized exercise prescription for aerobic and resistive training based on initial evaluation findings, risk stratification, comorbidities and participant's personal goals.       Expected Outcomes  Short Term: Increase workloads from initial exercise prescription for resistance, speed, and METs.;Short Term: Perform resistance training exercises  routinely during rehab and add in resistance training at home;Long Term: Improve cardiorespiratory fitness, muscular endurance and strength as measured by increased METs and functional capacity (6MWT)       Able to understand and use rate of perceived exertion (RPE) scale  Yes       Intervention  Provide education and explanation on how to use RPE scale       Expected Outcomes  Short Term: Able to use RPE daily in rehab to express subjective intensity level;Long Term:  Able to use RPE to guide intensity level when exercising independently       Able to understand and use Dyspnea scale  Yes       Intervention  Provide education and explanation on how to use Dyspnea scale       Expected Outcomes  Short Term: Able to use Dyspnea scale daily in rehab to express subjective sense of shortness of breath during exertion;Long Term: Able to use Dyspnea scale to guide intensity level when exercising independently       Knowledge and understanding of Target Heart Rate Range (THRR)  Yes       Intervention  Provide education and explanation of THRR including how the numbers were predicted and where they are located for reference       Expected Outcomes  Short Term: Able to state/look up THRR;Short Term: Able to use daily as guideline for intensity in rehab;Long Term: Able to use THRR to govern intensity when exercising independently       Able to check pulse independently  Yes       Intervention  Provide education and demonstration on how to check pulse in carotid and radial arteries.;Review the importance of being able to check your own pulse for safety during independent exercise       Expected Outcomes  Short Term: Able to explain why pulse checking is important during independent exercise;Long Term: Able to check pulse independently and accurately       Understanding of Exercise Prescription  Yes       Intervention  Provide education, explanation, and written materials on patient's individual exercise  prescription       Expected Outcomes  Short Term: Able to explain program exercise prescription;Long Term: Able to explain home exercise prescription to exercise independently          Exercise Goals Re-Evaluation : Exercise Goals Re-Evaluation    Row Name 11/20/18 9509 12/10/18 1507 12/23/18 1107 12/25/18 0943       Exercise Goal Re-Evaluation   Exercise Goals Review  Increase Physical Activity;Increase Strength and Stamina;Able to understand and use rate of perceived exertion (RPE) scale;Knowledge and understanding of Target Heart Rate Range (THRR);Understanding of Exercise Prescription  Increase Physical Activity;Increase Strength and Stamina;Understanding of Exercise Prescription  -  Increase Physical Activity;Increase Strength and Stamina;Understanding of Exercise Prescription    Comments  Reviewed RPE scale, THR and program prescription with pt today.  Pt voiced understanding and was given a copy of goals to take home.   Stephen [name] has been doing fairly well in  rehab. He was admitted last week with plans for d/c today or tomorrow.  He will need clearance to return.  He had increased ot level 4 on the NuStep.  We will continue to monitor his progression.   Out since last review  Stephen [name] is back to rehab.  He has been doing what he can with exercise.  He feels that his strength and stamina are doing better.  He will be graduating next week.  He is planning to go [employer] after graduation and then return to rehab with a new referral. Reviewed home exercise with pt today.  Pt plans to go to community gym and then join [employer]. for exercise.  Reviewed THR, pulse, RPE, sign and symptoms, NTG use, and when to call 911 or MD.  Also discussed weather considerations and indoor options.  Pt voiced understanding.    Expected Outcomes  Short: Use RPE daily to regulate intensity. Long: Follow program prescription in THR.  Short: Clearance to return to rehab.  Long: Continue to follow program prescription.  -   Short: Continue to go to gym. Long: Continue to exercise independently       Discharge Exercise Prescription (Final Exercise Prescription Changes): Exercise Prescription Changes - 12/10/18 1500      Response to Exercise   Blood Pressure (Admit)  86/66    Blood Pressure (Exercise)  112/78    Blood Pressure (Exit)  112/58    Heart Rate (Admit)  66 bpm    Heart Rate (Exercise)  94 bpm    Heart Rate (Exit)  73 bpm    Rating of Perceived Exertion (Exercise)  11    Symptoms  hypotension    Comments  from visit on 12/3 prior to admission on 12/4    Duration  Continue with 30 min of aerobic exercise without signs/symptoms of physical distress.    Intensity  THRR unchanged      Progression   Progression  Continue to progress workloads to maintain intensity without signs/symptoms of physical distress.    Average METs  2.94      Resistance Training   Training Prescription  Yes    Weight  5 lbs    Reps  10-15      Interval Training   Interval Training  No      Recumbant Bike   Level  3    Minutes  15    METs  2.94      T5 Nustep   Level  4    Minutes  15       Nutrition:  Target Goals: Understanding of nutrition guidelines, daily intake of sodium <1573m, cholesterol <2084m calories 30% from fat and 7% or less from saturated fats, daily to have 5 or more servings of fruits and vegetables.  Biometrics: Pre Biometrics - 11/10/18 1534      Pre Biometrics   Height  '6\' 1"'  (1.854 m)    Weight  223 lb 12.8 oz (101.5 kg)    Waist Circumference  41 inches    Hip Circumference  44 inches    Waist to Hip Ratio  0.93 %    BMI (Calculated)  29.53    Single Leg Stand  12.78 seconds        Nutrition Therapy Plan and Nutrition Goals: Nutrition Therapy & Goals - 11/10/18 1452      Intervention Plan   Intervention  Prescribe, educate and counsel regarding individualized specific dietary modifications aiming towards targeted core components such  as weight, hypertension, lipid  management, diabetes, heart failure and other comorbidities.    Expected Outcomes  Short Term Goal: Understand basic principles of dietary content, such as calories, fat, sodium, cholesterol and nutrients.;Short Term Goal: A plan has been developed with personal nutrition goals set during dietitian appointment.;Long Term Goal: Adherence to prescribed nutrition plan.       Nutrition Assessments: Nutrition Assessments - 11/10/18 1452      MEDFICTS Scores   Pre Score  30       Nutrition Goals Re-Evaluation: Nutrition Goals Re-Evaluation    [place] Name 12/25/18 0941             Goals   Nutrition Goal  Stick to heart healthy diet high in vegetables and lean meat.        Comment  Stephen [name] says he has been eating like a rabbit. He is also grilling his chicken with just some garlic.  He continues to stay away from fried foods.  He is not snacking as much either.  He continues to use the food tracker as well.        Expected Outcome  Short: Continue to work on heart healthy diet.  Long: Continue to follow diet.           Nutrition Goals Discharge (Final Nutrition Goals Re-Evaluation): Nutrition Goals Re-Evaluation - 12/25/18 0941      Goals   Nutrition Goal  Stick to heart healthy diet high in vegetables and lean meat.     Comment  Stephen [name] says he has been eating like a rabbit. He is also grilling his chicken with just some garlic.  He continues to stay away from fried foods.  He is not snacking as much either.  He continues to use the food tracker as well.     Expected Outcome  Short: Continue to work on heart healthy diet.  Long: Continue to follow diet.        Psychosocial: Target Goals: Acknowledge presence or absence of significant depression and/or stress, maximize coping skills, provide positive support system. Participant is able to verbalize types and ability to use techniques and skills needed for reducing stress and depression.   Initial Review & Psychosocial Screening: Initial Psych  Review & Screening - 11/10/18 1446      Initial Review   Current issues with  Current Sleep Concerns;Current Stress Concerns   issues with shortness of breath while sleeping   Source of Stress Concerns  Transportation;Unable to perform yard/household activities      [place]?  Yes   family, friends     Screening Interventions   Interventions  Encouraged to exercise;Program counselor consult;To provide support and resources with identified psychosocial needs;Provide feedback about the scores to participant    Expected Outcomes  Short Term goal: Utilizing psychosocial counselor, staff and physician to assist with identification of specific Stressors or current issues interfering with healing process. Setting desired goal for each stressor or current issue identified.;Long Term Goal: Stressors or current issues are controlled or eliminated.;Short Term goal: Identification and review with participant of any Quality of Life or Depression concerns found by scoring the questionnaire.;Long Term goal: The participant improves quality of Life and PHQ9 Scores as seen by post scores and/or verbalization of changes       Quality of Life Scores:  Quality of Life - 11/10/18 1448      Quality of Life   Select  Quality of Life  Quality of Life Scores   Health/Function Pre  21.03 %    Socioeconomic Pre  25.31 %    Psych/Spiritual Pre  28.29 %    Family Pre  21.6 %    GLOBAL Pre  23.54 %      Scores of 19 and below usually indicate a poorer quality of life in these areas.  A difference of  2-3 points is a clinically meaningful difference.  A difference of 2-3 points in the total score of the Quality of Life Index has been associated with significant improvement in overall quality of life, self-image, physical symptoms, and general health in studies assessing change in quality of life.  PHQ-9: Recent Review Flowsheet Data    Depression screen [facility] 2/9 12/25/2018  11/10/2018 01/23/2018 10/03/2017 09/30/2017   Decreased Interest '2 3 1 ' 0 0   Down, Depressed, Hopeless 0 0 0 0 0   PHQ - 2 Score '2 3 1 ' 0 0   Altered sleeping '1 3 2 ' - -   Tired, decreased energy '1 2 1 ' - -   Change in appetite 0 0 1 - -   Feeling bad or failure about yourself  0 0 0 - -   Trouble concentrating 0 0 0 - -   Moving slowly or fidgety/restless 1 1 0 - -   Suicidal thoughts - 0 0 - -   PHQ-9 Score '5 9 5 ' - -   Difficult doing work/chores Somewhat difficult Somewhat difficult Not difficult at all - -     Interpretation of Total Score  Total Score Depression Severity:  1-4 = Minimal depression, 5-9 = Mild depression, 10-14 = Moderate depression, 15-19 = Moderately severe depression, 20-27 = Severe depression   Psychosocial Evaluation and Intervention: Psychosocial Evaluation - 12/25/18 1009      Psychosocial Evaluation & Interventions   Interventions  Stress management education;Encouraged to exercise with the program and follow exercise prescription    Comments  Counselor met with Stephen [name] (Stephen [name]) today for initial psychosocial evaluation.  He is a 52 year old who has Congestive Heart failure and was recently in the hospital with pneumonia.  He lives alone and has a strong support system with a daughter, sister and family locally as well as active involvement in his local church.  Stephen [name] has some chronic knee pain and states he needs surgery but his heart is not "strong enough" to do this yet.  He sleeps well and has a good appetite.  Stephen [name] denies a history of depression or anxiety or any current symptoms and states he is typically in a positive mood.  He has some financial stress currently as he is applying for SS disability as his health prevents him from working.  He has goals to get back to 100% functioning and be strong enough for surgery on his knees.  He will be followed by staff.      Expected Outcomes  Short:  Stephen [name] will exercise consistently to regain his strength and be able to  eventually have surgery on his knees.  Long:  Stephen [name] will exercise for his health and his mental health on a regular basis.      Continue Psychosocial Services   Follow up required by staff       Psychosocial Re-Evaluation:   Psychosocial Discharge (Final Psychosocial Re-Evaluation):   Vocational Rehabilitation: Provide vocational rehab assistance to qualifying candidates.   Vocational Rehab Evaluation & Intervention: Vocational Rehab - 11/10/18 1446      Initial  Vocational Rehab Evaluation & Intervention   Assessment shows need for Vocational Rehabilitation  No       Education: Education Goals: Education classes will be provided on a variety of topics geared toward better understanding of heart health and risk factor modification. Participant will state understanding/return demonstration of topics presented as noted by education test scores.  Learning Barriers/Preferences: Learning Barriers/Preferences - 11/10/18 1431      Learning Barriers/Preferences   Learning Barriers  None    Learning Preferences  Group Instruction;Skilled Demonstration       Education Topics:  AED/CPR: - Group verbal and written instruction with the use of models to demonstrate the basic use of the AED with the basic ABC's of resuscitation.   Cardiac Rehab from 03/13/2018 in [facility] Cardiac and Pulmonary Rehab  Date  02/11/18  Educator  SB  Instruction Review Code  1- Verbalizes Understanding      General Nutrition Guidelines/Fats and Fiber: -Group instruction provided by verbal, written material, models and posters to present the general guidelines for heart healthy nutrition. Gives an explanation and review of dietary fats and fiber.   Controlling Sodium/Reading Food Labels: -Group verbal and written material supporting the discussion of sodium use in heart healthy nutrition. Review and explanation with models, verbal and written materials for utilization of the food label.   Cardiac Rehab from  03/13/2018 in [facility] Cardiac and Pulmonary Rehab  Date  02/04/18  Educator  CR  Instruction Review Code  1- Verbalizes Understanding      Exercise Physiology & General Exercise Guidelines: - Group verbal and written instruction with models to review the exercise physiology of the cardiovascular system and associated critical values. Provides general exercise guidelines with specific guidelines to those with heart or lung disease.    Cardiac Rehab from 03/13/2018 in [facility] Cardiac and Pulmonary Rehab  Date  02/20/18  Educator  [facility]  Instruction Review Code  1- Verbalizes Understanding      Aerobic Exercise & Resistance Training: - Gives group verbal and written instruction on the various components of exercise. Focuses on aerobic and resistive training programs and the benefits of this training and how to safely progress through these programs..   Cardiac Rehab from 12/02/2018 in [facility] Cardiac and Pulmonary Rehab  Date  11/20/18  Educator  AS  Instruction Review Code  1- Verbalizes Understanding      Flexibility, Balance, Mind/Body Relaxation: Provides group verbal/written instruction on the benefits of flexibility and balance training, including mind/body exercise modes such as yoga, pilates and tai chi.  Demonstration and skill practice provided.   Stress and Anxiety: - Provides group verbal and written instruction about the health risks of elevated stress and causes of high stress.  Discuss the correlation between heart/lung disease and anxiety and treatment options. Review healthy ways to manage with stress and anxiety.   Cardiac Rehab from 03/13/2018 in [facility] Cardiac and Pulmonary Rehab  Date  03/04/18  Educator  [facility]  Instruction Review Code  1- Verbalizes Understanding      Depression: - Provides group verbal and written instruction on the correlation between heart/lung disease and depressed mood, treatment options, and the stigmas associated with seeking treatment.   Cardiac Rehab  from 03/13/2018 in [facility] Cardiac and Pulmonary Rehab  Date  02/18/18  Educator  [facility]  Instruction Review Code  1- Verbalizes Understanding      Anatomy & Physiology of the Heart: - Group verbal and written instruction and models provide basic cardiac anatomy and physiology, with  the coronary electrical and arterial systems. Review of Valvular disease and Heart Failure   Cardiac Procedures: - Group verbal and written instruction to review commonly prescribed medications for heart disease. Reviews the medication, class of the drug, and side effects. Includes the steps to properly store meds and maintain the prescription regimen. (beta blockers and nitrates)   Cardiac Medications I: - Group verbal and written instruction to review commonly prescribed medications for heart disease. Reviews the medication, class of the drug, and side effects. Includes the steps to properly store meds and maintain the prescription regimen.   Cardiac Rehab from 03/13/2018 in [facility] Cardiac and Pulmonary Rehab  Date  03/11/18  Educator  SB  Instruction Review Code  1- Verbalizes Understanding      Cardiac Medications II: -Group verbal and written instruction to review commonly prescribed medications for heart disease. Reviews the medication, class of the drug, and side effects. (all other drug classes)   Cardiac Rehab from 12/02/2018 in [facility] Cardiac and Pulmonary Rehab  Date  12/02/18  Educator  SB  Instruction Review Code  1- Verbalizes Understanding       Go Sex-Intimacy & Heart Disease, Get SMART - Goal Setting: - Group verbal and written instruction through game format to discuss heart disease and the return to sexual intimacy. Provides group verbal and written material to discuss and apply goal setting through the application of the S.M.A.R.T. Method.   Other Matters of the Heart: - Provides group verbal, written materials and models to describe Stable Angina and Peripheral Artery. Includes description of  the disease process and treatment options available to the cardiac patient.   Exercise & Equipment Safety: - Individual verbal instruction and demonstration of equipment use and safety with use of the equipment.   Cardiac Rehab from 12/02/2018 in [facility] Cardiac and Pulmonary Rehab  Date  11/10/18  Educator  [facility]  Instruction Review Code  1- Verbalizes Understanding      Infection Prevention: - Provides verbal and written material to individual with discussion of infection control including proper hand washing and proper equipment cleaning during exercise session.   Cardiac Rehab from 12/02/2018 in [facility] Cardiac and Pulmonary Rehab  Date  11/10/18  Educator  [facility]  Instruction Review Code  1- Verbalizes Understanding      Falls Prevention: - Provides verbal and written material to individual with discussion of falls prevention and safety.   Cardiac Rehab from 12/02/2018 in [facility] Cardiac and Pulmonary Rehab  Date  11/10/18  Educator  [facility]  Instruction Review Code  1- Verbalizes Understanding      Diabetes: - Individual verbal and written instruction to review signs/symptoms of diabetes, desired ranges of glucose level fasting, after meals and with exercise. Acknowledge that pre and post exercise glucose checks will be done for 3 sessions at entry of program.   Know Your Numbers and Risk Factors: -Group verbal and written instruction about important numbers in your health.  Discussion of what are risk factors and how they play a role in the disease process.  Review of Cholesterol, Blood Pressure, Diabetes, and BMI and the role they play in your overall health.   Cardiac Rehab from 12/02/2018 in [facility] Cardiac and Pulmonary Rehab  Date  12/02/18  Educator  SB  Instruction Review Code  1- Verbalizes Understanding      Sleep Hygiene: -Provides group verbal and written instruction about how sleep can affect your health.  Define sleep hygiene, discuss sleep cycles and impact of sleep habits.  Review good sleep hygiene tips.    Cardiac Rehab from 03/13/2018 in [facility] Cardiac and Pulmonary Rehab  Date  01/30/18  Educator  [facility]  Instruction Review Code  1- Verbalizes Understanding      Other: -Provides group and verbal instruction on various topics (see comments)   Knowledge Questionnaire Score: Knowledge Questionnaire Score - 11/10/18 1444      Knowledge Questionnaire Score   Pre Score  13/26   correct answers reviewed with Stephen [name]. focus on nutrition, exercise, Angina      Core Components/Risk Factors/Patient Goals at Admission: Personal Goals and Risk Factors at Admission - 11/10/18 1428      Core Components/Risk Factors/Patient Goals on Admission    Weight Management  Yes;Weight Maintenance    Intervention  Weight Management: Develop a combined nutrition and exercise program designed to reach desired caloric intake, while maintaining appropriate intake of nutrient and fiber, sodium and fats, and appropriate energy expenditure required for the weight goal.;Weight Management: Provide education and appropriate resources to help participant work on and attain dietary goals.    Admit Weight  225 lb (102.1 kg)    Heart Failure  Yes    Intervention  Provide a combined exercise and nutrition program that is supplemented with education, support and counseling about heart failure. Directed toward relieving symptoms such as shortness of breath, decreased exercise tolerance, and extremity edema.    Expected Outcomes  Improve functional capacity of life;Short term: Attendance in program 2-3 days a week with increased exercise capacity. Reported lower sodium intake. Reported increased fruit and vegetable intake. Reports medication compliance.;Short term: Daily weights obtained and reported for increase. Utilizing diuretic protocols set by physician.;Long term: Adoption of self-care skills and reduction of barriers for early signs and symptoms recognition and intervention leading to self-care  maintenance.    Lipids  Yes    Intervention  Provide education and support for participant on nutrition & aerobic/resistive exercise along with prescribed medications to achieve LDL <44m, HDL >465m    Expected Outcomes  Short Term: Participant states understanding of desired cholesterol values and is compliant with medications prescribed. Participant is following exercise prescription and nutrition guidelines.;Long Term: Cholesterol controlled with medications as prescribed, with individualized exercise RX and with personalized nutrition plan. Value goals: LDL < 7094mHDL > 40 mg.       Core Components/Risk Factors/Patient Goals Review:  Goals and Risk Factor Review    Row Name 12/25/18 [id number]          Core Components/Risk Factors/Patient Goals Review   Personal Goals Review  Weight Management/Obesity;Diabetes;Heart Failure;Hypertension;Lipids       Review  TimOctavia Bruckners returned to rehab after being hospitalized.  He has not had any heart failure symptoms since getting discharged.  His weight is trending down and he is watching his portion sizes.  He is watching his salt intake as well.  He is doing well with his blood pressures and checks them daily.  He is doing well with his blood sugars and has been checking them daily.  He is still working on medication adjustments and currently doing well.           Core Components/Risk Factors/Patient Goals at Discharge (Final Review):  Goals and Risk Factor Review - 12/25/18 0938      Core Components/Risk Factors/Patient Goals Review   Personal Goals Review  Weight Management/Obesity;Diabetes;Heart Failure;Hypertension;Lipids    Review  TimOctavia Bruckners returned to rehab after being hospitalized.  He has not had any  heart failure symptoms since getting discharged.  His weight is trending down and he is watching his portion sizes.  He is watching his salt intake as well.  He is doing well with his blood pressures and checks them daily.  He is doing well with  his blood sugars and has been checking them daily.  He is still working on medication adjustments and currently doing well.        ITP Comments: ITP Comments    Row Name 11/10/18 1400 12/03/18 0617 12/10/18 1506 12/23/18 1106 12/30/18 0640   ITP Comments  Med Review completed. Initial ITP created. Diagnosis can be found in Gravity Visit 10/30  30 day review. Continue with ITP unless direccted changes per Medical Director Chart Review. New to program  Stephen [name] was admitted to hospital on 12/4 with PNA and worsening heart failure.  He called today to let us know that they are planning to discharge him tonight or tomorrow.  He is planning to stay out next week with follow up and hopes to return the week of Christmas.  We told him that he will need clearance from the doctor to return to rehab.   He voiced understanding.   Cleared to return to rehab on 12/25/18.  Will graduate on 12/31 due to insurance.   30 Day Review. Continue with ITP unless directed changes per Medical Director review      Comments: discharge ITP

## 2019-01-01 ENCOUNTER — Encounter: Payer: Self-pay | Admitting: Family Medicine

## 2019-01-01 ENCOUNTER — Ambulatory Visit (INDEPENDENT_AMBULATORY_CARE_PROVIDER_SITE_OTHER): Payer: Commercial Managed Care - PPO | Admitting: Family Medicine

## 2019-01-01 VITALS — BP 129/75 | HR 55 | Temp 98.6°F | Ht 72.5 in | Wt 236.3 lb

## 2019-01-01 DIAGNOSIS — N179 Acute kidney failure, unspecified: Secondary | ICD-10-CM | POA: Diagnosis not present

## 2019-01-01 DIAGNOSIS — E876 Hypokalemia: Secondary | ICD-10-CM

## 2019-01-01 DIAGNOSIS — R197 Diarrhea, unspecified: Secondary | ICD-10-CM

## 2019-01-01 NOTE — Progress Notes (Signed)
BP 129/75 (BP Location: Left Arm, Patient Position: Sitting, Cuff Size: Normal)   Pulse (!) 55   Temp 98.6 F (37 C) (Oral)   Ht 6' 0.5" (1.842 m)   Wt 236 lb 4.8 oz (107.2 kg)   SpO2 99%   BMI 31.61 kg/m    Subjective:    Patient ID: Stephen [name], male    DOB: March 06, 1966, 53 y.o.   MRN: [id number]  HPI: Stephen [name] is a 53 y.o. male  Chief Complaint  Patient presents with  . Hospitalization Follow-up    Hypkalemia, AKI   Transition of Care Hospital Follow up.-  Stephen [name] was admitted to the hospital on 12/27/18 for significant nausea and vomiting with diarrhea since 12/26/18. He was complaining of dizziness and lightheadedness. He has known renal insuffiency and had been treated for pneumonia, there as a concern that her had C. Diff- he was admitted to the ICU on 12/27/18 for management. No notes after 12/28 available in the computer. He notes that he went to a christmas party and was sick with a stomach bug.  Hospital/Facility: [facility] D/C Physician: Unknown- discharge summary not available at this time D/C Date: 12/28/18  Records Requested: 01/01/19 Records Received: TBD Records Reviewed: TBD  Diagnoses on Discharge: Gastroenteritis, dehydration, AKI, non-ischemic cardiomyopathy  Date of interactive Contact within 48 hours of discharge: NOT DONE  Date of 7 day or 14 day face-to-face visit: 01/01/19   within 7 days  Outpatient Encounter Medications as of 01/01/2019  Medication Sig Note  . acetaminophen (TYLENOL) 325 MG tablet Take 2 tablets (650 mg total) by mouth every 6 (six) hours as needed. (Patient taking differently: Take 325-650 mg by mouth every 6 (six) hours as needed for moderate pain. )   . albuterol (PROVENTIL HFA;VENTOLIN HFA) 108 (90 Base) MCG/ACT inhaler Inhale 2 puffs into the lungs every 4 (four) hours as needed for wheezing or shortness of breath.   . allopurinol (ZYLOPRIM) 100 MG tablet Take 2 tablets (200 mg total) by mouth daily.   Marland Kitchen aspirin EC 81 MG EC  tablet Take 1 tablet (81 mg total) by mouth daily.   Marland Kitchen atorvastatin (LIPITOR) 40 MG tablet Take 80 mg by mouth daily at 6 PM.    . CAMPHOR-EUCALYPTUS-MENTHOL EX Apply 1 application topically as needed (congestion).   . carvedilol (COREG) 3.125 MG tablet Take 1 tablet (3.125 mg total) by mouth 2 (two) times daily with a meal.   . clopidogrel (PLAVIX) 75 MG tablet Take 1 tablet (75 mg total) by mouth daily.   . colchicine 0.6 MG tablet Take 1 tablet (0.6 mg total) by mouth daily as needed (gout flare).   . cyclobenzaprine (FLEXERIL) 5 MG tablet Take 1 tablet (5 mg total) by mouth 3 (three) times daily as needed (jaw pain).   Marland Kitchen diclofenac sodium (VOLTAREN) 1 % GEL Apply 2 g topically 4 (four) times daily as needed (pain).    . fluticasone (FLONASE) 50 MCG/ACT nasal spray Place 2 sprays into both nostrils daily. (Patient taking differently: Place 2 sprays into both nostrils daily as needed for allergies. )   . fluticasone-salmeterol (ADVAIR HFA) 115-21 MCG/ACT inhaler Inhale 2 puffs into the lungs 2 (two) times daily.   . meloxicam (MOBIC) 15 MG tablet Take 15 mg by mouth daily.   . Menthol, Topical Analgesic, (ICY HOT EX) Apply 1 application topically daily as needed (pain).   . methimazole (TAPAZOLE) 10 MG tablet Take 4 tablets (40 mg total) by mouth daily.   Marland Kitchen  metolazone (ZAROXOLYN) 2.5 MG tablet Take 2.5 mg by mouth as needed.  01/01/2019: Twice a week  . Multiple Vitamin (MULTIVITAMIN WITH MINERALS) TABS tablet Take 1 tablet by mouth daily.   . nitroGLYCERIN (NITROSTAT) 0.4 MG SL tablet Place 1 tablet (0.4 mg total) under the tongue every 5 (five) minutes as needed for chest pain. 12/03/2018: Medication expired. Patient needs refills.  . ondansetron (ZOFRAN ODT) 4 MG disintegrating tablet Take 1 tablet (4 mg total) by mouth every 8 (eight) hours as needed for nausea or vomiting.   . potassium chloride SA (K-DUR,KLOR-CON) 20 MEQ tablet Take 1 tablet (20 mEq total) by mouth 4 (four) times daily.   .  promethazine (PHENERGAN) 12.5 MG tablet Take 1 tablet (12.5 mg total) by mouth every 6 (six) hours as needed for nausea or vomiting.   . sacubitril-valsartan (ENTRESTO) 24-26 MG Take 1 tablet by mouth 2 (two) times daily. (Patient taking differently: Take 0.5 tablets by mouth 2 (two) times daily. )   . Tiotropium Bromide Monohydrate (SPIRIVA RESPIMAT) 2.5 MCG/ACT AERS Inhale 1 puff into the lungs daily.   Marland Kitchen torsemide (DEMADEX) 20 MG tablet Take 2 tablets (40 mg total) by mouth daily. (Patient taking differently: Take 20 mg by mouth daily. Take additional 20mg  daily as needed)   . sildenafil (REVATIO) 20 MG tablet Take by mouth as needed.     No facility-administered encounter medications on file as of 01/01/2019.     Diagnostic Tests Reviewed: Labs  Disposition: Home  Consults: Cardiology  Discharge Instructions: Unknown, discharge summary not available at this time.   Disease/illness Education: Given today  Home Health/Community Services Discussions/Referrals: N/A  Establishment or re-establishment of referral orders for community resources: N/A  Discussion with other health care providers: None  Assessment and Support of treatment regimen adherence: Good  Appointments Coordinated with: Patient   Education for self-management, independent living, and ADLs: Given today   Relevant past medical, surgical, family and social history reviewed and updated as indicated. Interim medical history since our last visit reviewed. Allergies and medications reviewed and updated.  Review of Systems  Constitutional: Negative.   Respiratory: Negative.   Cardiovascular: Negative.   Gastrointestinal: Negative.   Psychiatric/Behavioral: Negative.     Per HPI unless specifically indicated above     Objective:    BP 129/75 (BP Location: Left Arm, Patient Position: Sitting, Cuff Size: Normal)   Pulse (!) 55   Temp 98.6 F (37 C) (Oral)   Ht 6' 0.5" (1.842 m)   Wt 236 lb 4.8 oz (107.2 kg)    SpO2 99%   BMI 31.61 kg/m   Wt Readings from Last 3 Encounters:  01/01/19 236 lb 4.8 oz (107.2 kg)  12/26/18 220 lb (99.8 kg)  12/21/18 235 lb (106.6 kg)    Physical Exam Vitals signs and nursing note reviewed.  Constitutional:      General: He is not in acute distress.    Appearance: Normal appearance. He is not ill-appearing, toxic-appearing or diaphoretic.  HENT:     Head: Normocephalic and atraumatic.     Right Ear: External ear normal.     Left Ear: External ear normal.     Nose: Nose normal.     Mouth/Throat:     Mouth: Mucous membranes are moist.     Pharynx: Oropharynx is clear.  Eyes:     General: No scleral icterus.       Right eye: No discharge.        Left eye:  No discharge.     Extraocular Movements: Extraocular movements intact.     Conjunctiva/sclera: Conjunctivae normal.     Pupils: Pupils are equal, round, and reactive to light.  Neck:     Musculoskeletal: Normal range of motion and neck supple.  Cardiovascular:     Rate and Rhythm: Normal rate and regular rhythm.     Pulses: Normal pulses.     Heart sounds: Normal heart sounds. No murmur. No friction rub. No gallop.   Pulmonary:     Effort: Pulmonary effort is normal. No respiratory distress.     Breath sounds: Normal breath sounds. No stridor. No wheezing, rhonchi or rales.  Chest:     Chest wall: No tenderness.  Musculoskeletal: Normal range of motion.     Right lower leg: No edema.     Left lower leg: No edema.  Skin:    General: Skin is warm and dry.     Capillary Refill: Capillary refill takes less than 2 seconds.     Coloration: Skin is not jaundiced or pale.     Findings: No bruising, erythema, lesion or rash.  Neurological:     General: No focal deficit present.     Mental Status: He is alert and oriented to person, place, and time. Mental status is at baseline.  Psychiatric:        Mood and Affect: Mood normal.        Behavior: Behavior normal.        Thought Content: Thought content  normal.        Judgment: Judgment normal.     Results for orders placed or performed during the hospital encounter of 12/27/18  Lipase, blood  Result Value Ref Range   Lipase 31 11 - 51 U/L  Comprehensive metabolic panel  Result Value Ref Range   Sodium 136 135 - 145 mmol/L   Potassium 3.0 (L) 3.5 - 5.1 mmol/L   Chloride 96 (L) 98 - 111 mmol/L   CO2 23 22 - 32 mmol/L   Glucose, Bld 209 (H) 70 - 99 mg/dL   BUN 64 (H) 6 - 20 mg/dL   Creatinine, Ser 2.52 (H) 0.61 - 1.24 mg/dL   Calcium 9.2 8.9 - 10.3 mg/dL   Total Protein 8.1 6.5 - 8.1 g/dL   Albumin 4.3 3.5 - 5.0 g/dL   AST 29 15 - 41 U/L   ALT 25 0 - 44 U/L   Alkaline Phosphatase 102 38 - 126 U/L   Total Bilirubin 0.7 0.3 - 1.2 mg/dL   GFR calc non Af Amer 28 (L) >60 mL/min   GFR calc Af Amer 32 (L) >60 mL/min   Anion gap 17 (H) 5 - 15  CBC  Result Value Ref Range   WBC 8.8 4.0 - 10.5 K/uL   RBC 5.30 4.22 - 5.81 MIL/uL   Hemoglobin 14.9 13.0 - 17.0 g/dL   HCT 45.6 39.0 - 52.0 %   MCV 86.0 80.0 - 100.0 fL   MCH 28.1 26.0 - 34.0 pg   MCHC 32.7 30.0 - 36.0 g/dL   RDW 16.1 (H) 11.5 - 15.5 %   Platelets 262 150 - 400 K/uL   nRBC 0.0 0.0 - 0.2 %  Urinalysis, Complete w Microscopic  Result Value Ref Range   Color, Urine STRAW (A) YELLOW   APPearance CLEAR (A) CLEAR   Specific Gravity, Urine 1.006 1.005 - 1.030   pH 6.0 5.0 - 8.0   Glucose, UA NEGATIVE NEGATIVE mg/dL  Hgb urine dipstick NEGATIVE NEGATIVE   Bilirubin Urine NEGATIVE NEGATIVE   Ketones, ur NEGATIVE NEGATIVE mg/dL   Protein, ur NEGATIVE NEGATIVE mg/dL   Nitrite NEGATIVE NEGATIVE   Leukocytes, UA NEGATIVE NEGATIVE   RBC / HPF 0-5 0 - 5 RBC/hpf   WBC, UA 0-5 0 - 5 WBC/hpf   Bacteria, UA NONE SEEN NONE SEEN   Squamous Epithelial / LPF NONE SEEN 0 - 5   Mucus PRESENT    Hyaline Casts, UA PRESENT   Troponin I - Add-On to previous collection  Result Value Ref Range   Troponin I 0.13 (HH) <0.03 ng/mL  Troponin I - Now Then Q6H  Result Value Ref Range    Troponin I 0.14 (HH) <0.03 ng/mL  TSH  Result Value Ref Range   TSH 0.070 (L) 0.350 - 4.500 uIU/mL  Hemoglobin A1c  Result Value Ref Range   Hgb A1c MFr Bld 5.7 (H) 4.8 - 5.6 %   Mean Plasma Glucose 116.89 mg/dL  Basic metabolic panel  Result Value Ref Range   Sodium 139 135 - 145 mmol/L   Potassium 3.2 (L) 3.5 - 5.1 mmol/L   Chloride 104 98 - 111 mmol/L   CO2 26 22 - 32 mmol/L   Glucose, Bld 104 (H) 70 - 99 mg/dL   BUN 51 (H) 6 - 20 mg/dL   Creatinine, Ser 1.57 (H) 0.61 - 1.24 mg/dL   Calcium 8.8 (L) 8.9 - 10.3 mg/dL   GFR calc non Af Amer 50 (L) >60 mL/min   GFR calc Af Amer 57 (L) >60 mL/min   Anion gap 9 5 - 15      Assessment & Plan:   Problem List Items Addressed This Visit      Genitourinary   AKI (acute kidney injury) ([place])    Will recheck labs today. Await results. Call with any concerns.         Other   Hypokalemia - Primary    Has been taking his potassium and eating oranges and bananas. Diarrhea has resolved. Will recheck his labs today. Await results. Call with any concerns.       Relevant Orders   Basic metabolic panel    Other Visit Diagnoses    Diarrhea, unspecified type       Likely gastroenteritis. We will chack for c diff- unlikely as he is doing better. Will check c diff. Await results. Call with any concerns.    Relevant Orders   Stool C-Diff Toxin Assay       Follow up plan: Return in about 3 months (around 04/02/2019), or Follow up.

## 2019-01-01 NOTE — Assessment & Plan Note (Signed)
Has been taking his potassium and eating oranges and bananas. Diarrhea has resolved. Will recheck his labs today. Await results. Call with any concerns.

## 2019-01-01 NOTE — Assessment & Plan Note (Signed)
Will recheck labs today. Await results. Call with any concerns.

## 2019-01-02 LAB — BASIC METABOLIC PANEL
BUN/Creatinine Ratio: 19 (ref 9–20)
BUN: 35 mg/dL — ABNORMAL HIGH (ref 6–24)
CO2: 23 mmol/L (ref 20–29)
Calcium: 9.8 mg/dL (ref 8.7–10.2)
Chloride: 98 mmol/L (ref 96–106)
Creatinine, Ser: 1.88 mg/dL — ABNORMAL HIGH (ref 0.76–1.27)
GFR calc Af Amer: 46 mL/min/{1.73_m2} — ABNORMAL LOW (ref >59)
GFR calc non Af Amer: 40 mL/min/{1.73_m2} — ABNORMAL LOW (ref >59)
Glucose: 95 mg/dL (ref 65–99)
Potassium: 4.3 mmol/L (ref 3.5–5.2)
SODIUM: 138 mmol/L (ref 134–144)

## 2019-01-06 ENCOUNTER — Ambulatory Visit: Payer: Commercial Managed Care - PPO | Admitting: Family

## 2019-01-07 ENCOUNTER — Ambulatory Visit (INDEPENDENT_AMBULATORY_CARE_PROVIDER_SITE_OTHER): Payer: Commercial Managed Care - PPO

## 2019-01-07 DIAGNOSIS — I472 Ventricular tachycardia, unspecified: Secondary | ICD-10-CM

## 2019-01-08 LAB — CUP PACEART REMOTE DEVICE CHECK
Battery Remaining Longevity: 130 mo
Battery Voltage: 3 V
Brady Statistic RV Percent Paced: 0.03 %
Date Time Interrogation Session: 20200108051703
HighPow Impedance: 81 Ohm
Implantable Lead Implant Date: 20181231
Implantable Lead Location: 753860
Implantable Pulse Generator Implant Date: 20181231
Lead Channel Impedance Value: 342 Ohm
Lead Channel Impedance Value: 418 Ohm
Lead Channel Pacing Threshold Amplitude: 2 V
Lead Channel Pacing Threshold Pulse Width: 0.4 ms
Lead Channel Sensing Intrinsic Amplitude: 4.875 mV
Lead Channel Sensing Intrinsic Amplitude: 4.875 mV
Lead Channel Setting Pacing Amplitude: 2.5 V
Lead Channel Setting Pacing Pulse Width: 1 ms
Lead Channel Setting Sensing Sensitivity: 0.3 mV

## 2019-01-08 NOTE — Progress Notes (Signed)
Remote ICD transmission.   

## 2019-01-09 ENCOUNTER — Telehealth: Payer: Self-pay | Admitting: Internal Medicine

## 2019-01-09 ENCOUNTER — Other Ambulatory Visit: Payer: Self-pay | Admitting: Internal Medicine

## 2019-01-09 ENCOUNTER — Telehealth: Payer: Self-pay

## 2019-01-09 NOTE — Telephone Encounter (Signed)
Spoke with pt regarding ICD shock from 12/26/18 that occurred in the context of a 4 day hx of N&V, which resulted in low potassium. Pt stated that they did not check his ICD during that admission, informed pt of driving restrictions x 6 months advised pt to keep f/u apt with [facility] as scheduled pt voiced understanding

## 2019-01-09 NOTE — Telephone Encounter (Signed)
Received records request [employer],  forwarded to [place] for processing.

## 2019-01-09 NOTE — Telephone Encounter (Signed)
Late entry: Reviewed amiodarone again with [name] upon his return to clinic yesterday. Per [name], he will leave the patient off amiodarone until his follow up appointment on 01/20/19.  He will plan to readdress amiodarone at that time.

## 2019-01-12 NOTE — Progress Notes (Signed)
Patient ID: Stephen [name], male    DOB: August 31, 1966, 53 y.o.   MRN: [id number]  HPI  [name] is a 53 y/o male with a history of TIA, HTN, CKD, hyperlipidemia, cardiac arrest, gout, previous tobacco use and chronic heart failure.   Echo report from 06/30/18 reviewed and showed an EF of 10-15% along with mild MR. Echo done 05/21/17 showed an EF of 20-25% which is stable from previous echo which was done 06/22/16 with an EF of 20%, mild MR, no aortic stenosis or regurgitation.   Cardiac catheterization done 09/09/17 revealing normal coronary arteries.  Has been admitted twice and in the ED once during December 2019. Admitted 08/19/18 due to defibrillator shock due to hypokalemia and dehydration. Cardiology consult obtained. Device was checked. Discharged the following day.   Patient presents today for a follow-up visit with a chief complaint of minimal shortness of breath upon moderate exertion. He describes this as having been present for several years. He has associated light-headedness, difficulty sleeping and chronic back pain along with this. He denies any abdominal distention, palpitations, pedal edema, chest pain, cough, fatigue or weight gain. He received a shock from his ICD on 12/26/18 and is aware of the driving restrictions for 6 months.   Past Medical History:  Diagnosis Date  . AICD (automatic cardioverter/defibrillator) present   . AKI (acute kidney injury) ([place] [facility]) 12/24/2017  . Arrhythmia   . Arthritis    "lower back, knees" (06/10/2018)  . Cardiac arrest ([facility]) 12/23/2017   Brief V-fib arrest  . Chest pain 09/08/2017  . CHF (congestive heart failure) ([facility])    nonischemic cardiomyopathy, EF 25%  . Gout    "on daily RX" (06/10/2018)  . Headache    "q couple months" (06/10/2018)  . High cholesterol   . History of kidney stones   . Hypertension   . Influenza A 12/24/2017  . NICM (nonischemic cardiomyopathy) ([place])   . Pneumonia 12/20/2017  . TIA (transient ischemic attack)  06/21/2016   "still affects my memory a little bit" (06/10/2018)   Past Surgical History:  Procedure Laterality Date  . EXTERNAL FIXATION LEG Right ~ 2000   "was going bowlegged; had to brake my leg to fix it"  . HERNIA REPAIR    . ICD IMPLANT N/A 12/30/2017   Procedure: ICD IMPLANT;  Surgeon: Deboraha Sprang, MD;  Location: [facility];  Service: Cardiovascular;  Laterality: N/A;  . LEFT HEART CATH AND CORONARY ANGIOGRAPHY N/A 09/09/2017   Procedure: LEFT HEART CATH AND CORONARY ANGIOGRAPHY;  Surgeon: Teodoro Spray, MD;  Location: [facility];  Service: Cardiovascular;  Laterality: N/A;  . UMBILICAL HERNIA REPAIR  1990s  . V TACH ABLATION  06/10/2018  . V TACH ABLATION N/A 06/10/2018   Procedure: V TACH ABLATION;  Surgeon: Thompson Grayer, MD;  Location: [facility];  Service: Cardiovascular;  Laterality: N/A;  . VASECTOMY      Family History  Problem Relation Age of Onset  . Hypertension Mother   . Heart failure Mother   . Hypertension Father   . CAD Father   . Heart attack Father     Social History   Tobacco Use  . Smoking status: Former Smoker    Packs/day: 0.33    Years: 33.00    Pack years: 10.89    Types: Cigarettes    Last attempt to quit: 02/20/2016    Years since quitting: 2.8  . Smokeless tobacco: Never Used  Substance  Use Topics  . Alcohol use: Yes    Alcohol/week: 0.0 standard drinks    Comment: 06/10/2018 "beer once/month; if that"    Allergies  Allergen Reactions  . Lisinopril Cough   Prior to Admission medications   Medication Sig Start Date End Date Taking? Authorizing Provider  acetaminophen (TYLENOL) 325 MG tablet Take 2 tablets (650 mg total) by mouth every 6 (six) hours as needed. Patient taking differently: Take 325-650 mg by mouth every 6 (six) hours as needed for moderate pain.  03/19/18  Yes Carrie Mew, MD  albuterol (PROVENTIL HFA;VENTOLIN HFA) 108 (90 Base) MCG/ACT inhaler Inhale 2 puffs into the lungs every 4  (four) hours as needed for wheezing or shortness of breath. 09/19/18  Yes Johnson, Megan P, DO  allopurinol (ZYLOPRIM) 100 MG tablet Take 2 tablets (200 mg total) by mouth daily. Patient taking differently: Take 200 mg by mouth daily as needed.  10/19/18  Yes Johnson, Megan P, DO  aspirin EC 81 MG EC tablet Take 1 tablet (81 mg total) by mouth daily. 01/01/18  Yes Daune Perch, NP  atorvastatin (LIPITOR) 40 MG tablet Take 80 mg by mouth daily at 6 PM.  10/29/18  Yes [provider]  CAMPHOR-EUCALYPTUS-MENTHOL EX Apply 1 application topically as needed (congestion).   Yes [provider]  carvedilol (COREG) 3.125 MG tablet Take 1 tablet (3.125 mg total) by mouth 2 (two) times daily with a meal. Patient taking differently: Take 6.25 mg by mouth daily.  07/06/18  Yes Wieting, Richard, MD  clopidogrel (PLAVIX) 75 MG tablet Take 1 tablet (75 mg total) by mouth daily. 01/30/18  Yes Bensimhon, Shaune Pascal, MD  colchicine 0.6 MG tablet Take 1 tablet (0.6 mg total) by mouth daily as needed (gout flare). 09/03/18  Yes Johnson, Megan P, DO  cyclobenzaprine (FLEXERIL) 5 MG tablet Take 1 tablet (5 mg total) by mouth 3 (three) times daily as needed (jaw pain). 11/24/18  Yes Nance Pear, MD  diclofenac sodium (VOLTAREN) 1 % GEL Apply 2 g topically 4 (four) times daily as needed (pain).    Yes [provider]  fluticasone (FLONASE) 50 MCG/ACT nasal spray Place 2 sprays into both nostrils daily. Patient taking differently: Place 2 sprays into both nostrils daily as needed for allergies.  06/20/17  Yes [name], MD  fluticasone-salmeterol (ADVAIR HFA) [phone] MCG/ACT inhaler Inhale 2 puffs into the lungs 2 (two) times daily. 08/12/18  Yes Kasa, Maretta Bees, MD  meloxicam (MOBIC) 15 MG tablet Take 15 mg by mouth daily as needed.    Yes [provider]  Menthol, Topical Analgesic, (ICY HOT EX) Apply 1 application topically daily as needed (pain).   Yes [provider]   methimazole (TAPAZOLE) 10 MG tablet Take 4 tablets (40 mg total) by mouth daily. 12/09/18  Yes Mayo, Pete Pelt, MD  metolazone (ZAROXOLYN) 2.5 MG tablet Take 2.5 mg by mouth as needed.  09/05/18  Yes [provider]  Multiple Vitamin (MULTIVITAMIN WITH MINERALS) TABS tablet Take 1 tablet by mouth daily.   Yes [provider]  nitroGLYCERIN (NITROSTAT) 0.4 MG SL tablet Place 1 tablet (0.4 mg total) under the tongue every 5 (five) minutes as needed for chest pain. 02/19/18  Yes Bensimhon, Shaune Pascal, MD  ondansetron (ZOFRAN ODT) 4 MG disintegrating tablet Take 1 tablet (4 mg total) by mouth every 8 (eight) hours as needed for nausea or vomiting. 12/21/18  Yes Earleen Newport, MD  potassium chloride SA (K-DUR,KLOR-CON) 20 MEQ tablet Take 1  tablet (20 mEq total) by mouth 4 (four) times daily. Patient taking differently: Take 20 mEq by mouth 2 (two) times daily.  12/09/18  Yes Mayo, Pete Pelt, MD  promethazine (PHENERGAN) 12.5 MG tablet Take 1 tablet (12.5 mg total) by mouth every 6 (six) hours as needed for nausea or vomiting. 11/24/18  Yes Nance Pear, MD  sacubitril-valsartan (ENTRESTO) 49-51 MG Take 1 tablet by mouth 2 (two) times daily.   Yes [provider]  sildenafil (REVATIO) 20 MG tablet Take by mouth as needed.  10/29/18 10/29/19 Yes [provider]  Tiotropium Bromide Monohydrate (SPIRIVA RESPIMAT) 2.5 MCG/ACT AERS Inhale 1 puff into the lungs daily. 08/12/18  Yes [name], MD  torsemide (DEMADEX) 20 MG tablet Take 2 tablets (40 mg total) by mouth daily. Patient taking differently: Take 40 mg by mouth 2 (two) times daily.  07/06/18  Yes Loletha Grayer, MD     Review of Systems  Constitutional: Negative for appetite change and fatigue.  HENT: Negative for congestion, postnasal drip and sore throat.   Eyes: Negative.   Respiratory: Positive for shortness of breath (with moderate exertion). Negative for cough, chest tightness and wheezing.    Cardiovascular: Negative for chest pain, palpitations and leg swelling.  Gastrointestinal: Negative for abdominal distention and constipation.  Endocrine: Negative.   Genitourinary: Negative.   Musculoskeletal: Positive for arthralgias (knees hurt) and back pain. Negative for neck pain.  Skin: Negative.   Allergic/Immunologic: Negative.   Neurological: Positive for light-headedness. Negative for dizziness.  Hematological: Negative for adenopathy. Does not bruise/bleed easily.  Psychiatric/Behavioral: Positive for sleep disturbance (sleeping ~ 2 hours at a time). Negative for dysphoric mood. The patient is not nervous/anxious.    Vitals:   01/13/19 1008  BP: 101/65  Pulse: (!) 50  Resp: 18  SpO2: 100%  Weight: 235 lb 8 oz (106.8 kg)  Height: 6\' 1"  (1.854 m)   Wt Readings from Last 3 Encounters:  01/13/19 235 lb 8 oz (106.8 kg)  01/01/19 236 lb 4.8 oz (107.2 kg)  12/26/18 220 lb (99.8 kg)   Lab Results  Component Value Date   CREATININE 1.88 (H) 01/01/2019   CREATININE 1.57 (H) 12/28/2018   CREATININE 2.52 (H) 12/26/2018    Physical Exam  Constitutional: He is oriented to person, place, and time. He appears well-developed and well-nourished.  HENT:  Head: Normocephalic and atraumatic.  Neck: Normal range of motion. Neck supple.  Cardiovascular: Normal rate and regular rhythm.  Pulmonary/Chest: Effort normal. He has no wheezes. He has no rales.  Abdominal: Soft. He exhibits no distension. There is no abdominal tenderness.  Musculoskeletal:        General: No tenderness or edema.  Neurological: He is alert and oriented to person, place, and time.  Skin: Skin is warm and dry.  Psychiatric: He has a normal mood and affect. His behavior is normal. Thought content normal.  Nursing note and vitals reviewed.    Assessment & Plan:  1: Chronic heart failure with reduced ejection fraction-  - NYHA Class II - euvolemic - weighing daily and he was reminded to call for  overnight weight gain of >2 pounds or a weekly weight gain of >5 pounds - weight down 2 pounds from last visit here 1 month ago - not adding salt; reminded to closely follow a 2000mg  sodium diet - says there's been discussion about LVAD (not interested) or transplant (would be interested); encouraged him to follow-up with [name] regarding this process as he says  that he doesn't want to return to [place] - BP limits ability to tirate entresto and/ or carvedilol - saw EP Caryl Comes) 11/18/18 & returns 01/20/2019 - received shock from his ICD on 12/26/18 - saw cardiology Clayborn Bigness) 10/29/18 & returns in 2 months - patient reports receiving his flu/ pneumonia vaccines for this season - BNP 12/15/18 was 755.0 - sleep study done 09/02/18 - PharmD reconciled medications with the patient  2: HTN-  - BP continues on the low side today - BMP done 01/01/2019 reviewed and shows sodium 138, potassium 4.3, creatinine 1.88 and GFR 46 - saw PCP Wynetta Emery) 01/01/2019 & returns April 2020  3: COPD- - using inhalers - PFT's done 08/01/17 - saw pulmonologist ([place]) 08/12/18  Patient did not bring his medications nor a list. Each medication was verbally reviewed with the patient and he was encouraged to bring the bottles to every visit to confirm accuracy of list.  Return in 4 months or sooner for any questions/problems before then.

## 2019-01-13 ENCOUNTER — Encounter: Payer: Self-pay | Admitting: Family

## 2019-01-13 ENCOUNTER — Ambulatory Visit: Payer: Commercial Managed Care - PPO | Attending: Family | Admitting: Family

## 2019-01-13 VITALS — BP 101/65 | HR 78 | Resp 18 | Ht 73.0 in | Wt 235.5 lb

## 2019-01-13 DIAGNOSIS — I5022 Chronic systolic (congestive) heart failure: Secondary | ICD-10-CM

## 2019-01-13 DIAGNOSIS — E7800 Pure hypercholesterolemia, unspecified: Secondary | ICD-10-CM | POA: Insufficient documentation

## 2019-01-13 DIAGNOSIS — G8929 Other chronic pain: Secondary | ICD-10-CM | POA: Diagnosis not present

## 2019-01-13 DIAGNOSIS — Z79899 Other long term (current) drug therapy: Secondary | ICD-10-CM | POA: Insufficient documentation

## 2019-01-13 DIAGNOSIS — Z8249 Family history of ischemic heart disease and other diseases of the circulatory system: Secondary | ICD-10-CM | POA: Diagnosis not present

## 2019-01-13 DIAGNOSIS — Z791 Long term (current) use of non-steroidal anti-inflammatories (NSAID): Secondary | ICD-10-CM | POA: Diagnosis not present

## 2019-01-13 DIAGNOSIS — Z8674 Personal history of sudden cardiac arrest: Secondary | ICD-10-CM | POA: Insufficient documentation

## 2019-01-13 DIAGNOSIS — M1990 Unspecified osteoarthritis, unspecified site: Secondary | ICD-10-CM | POA: Diagnosis not present

## 2019-01-13 DIAGNOSIS — Z87442 Personal history of urinary calculi: Secondary | ICD-10-CM | POA: Insufficient documentation

## 2019-01-13 DIAGNOSIS — J410 Simple chronic bronchitis: Secondary | ICD-10-CM

## 2019-01-13 DIAGNOSIS — I130 Hypertensive heart and chronic kidney disease with heart failure and stage 1 through stage 4 chronic kidney disease, or unspecified chronic kidney disease: Secondary | ICD-10-CM | POA: Diagnosis not present

## 2019-01-13 DIAGNOSIS — Z87891 Personal history of nicotine dependence: Secondary | ICD-10-CM | POA: Insufficient documentation

## 2019-01-13 DIAGNOSIS — I10 Essential (primary) hypertension: Secondary | ICD-10-CM

## 2019-01-13 DIAGNOSIS — E785 Hyperlipidemia, unspecified: Secondary | ICD-10-CM | POA: Diagnosis not present

## 2019-01-13 DIAGNOSIS — Z95810 Presence of automatic (implantable) cardiac defibrillator: Secondary | ICD-10-CM | POA: Diagnosis not present

## 2019-01-13 DIAGNOSIS — M109 Gout, unspecified: Secondary | ICD-10-CM | POA: Diagnosis not present

## 2019-01-13 DIAGNOSIS — J449 Chronic obstructive pulmonary disease, unspecified: Secondary | ICD-10-CM | POA: Insufficient documentation

## 2019-01-13 DIAGNOSIS — Z8673 Personal history of transient ischemic attack (TIA), and cerebral infarction without residual deficits: Secondary | ICD-10-CM | POA: Diagnosis not present

## 2019-01-13 DIAGNOSIS — Z888 Allergy status to other drugs, medicaments and biological substances status: Secondary | ICD-10-CM | POA: Diagnosis not present

## 2019-01-13 DIAGNOSIS — N189 Chronic kidney disease, unspecified: Secondary | ICD-10-CM | POA: Insufficient documentation

## 2019-01-13 DIAGNOSIS — Z7982 Long term (current) use of aspirin: Secondary | ICD-10-CM | POA: Diagnosis not present

## 2019-01-13 NOTE — Patient Instructions (Signed)
Continue weighing daily and call for an overnight weight gain of > 2 pounds or a weekly weight gain of >5 pounds. 

## 2019-01-13 NOTE — Discharge Summary (Signed)
[facility] [facility] [place] NAME: Stephen [name]    MR#:  [id number]  DATE OF BIRTH:  [birth year]  DATE OF ADMISSION:  12/27/2018 ADMITTING PHYSICIAN: Harrie Foreman, MD  DATE OF DISCHARGE: 12/28/2018  3:19 PM  PRIMARY CARE PHYSICIAN: Valerie Roys, DO   ADMISSION DIAGNOSIS:  Hypokalemia [E87.6] Elevated troponin [R79.89] Nausea vomiting and diarrhea [R11.2, R19.7]  DISCHARGE DIAGNOSIS:  Active Problems:   Hypokalemia   AKI (acute kidney injury) ([place])   SECONDARY DIAGNOSIS:   Past Medical History:  Diagnosis Date  . AICD (automatic cardioverter/defibrillator) present   . AKI (acute kidney injury) ([place]) 12/24/2017  . Arrhythmia   . Arthritis    "lower back, knees" (06/10/2018)  . Cardiac arrest ([place]) 12/23/2017   Brief V-fib arrest  . Chest pain 09/08/2017  . CHF (congestive heart failure) (HCC)    nonischemic cardiomyopathy, EF 25%  . Gout    "on daily RX" (06/10/2018)  . Headache    "q couple months" (06/10/2018)  . High cholesterol   . History of kidney stones   . Hypertension   . Influenza A 12/24/2017  . NICM (nonischemic cardiomyopathy) ([place])   . Pneumonia 12/20/2017  . TIA (transient ischemic attack) 06/21/2016   "still affects my memory a little bit" (06/10/2018)     ADMITTING HISTORY  HPI: The patient with past medical history of coronary artery disease and ischemic cardiomyopathy status post pacemaker/AICD placement presents to the emergency department complaining of nonbloody nonbilious emesis.  It started just today but the patient has had diarrhea for the last 2 days.  He had 4 episodes of loose stool when symptoms began and another 3 episodes of loose stool yesterday.  His stools have also been nonbloody and painless.  The patient became concerned when he began vomiting and feeling palpitations.  He admits to feeling dizzy during episodes of racing heartbeat.  He is concerned that his pacemaker is not working  correctly.  Laboratory evaluation revealed acute kidney injury which prompted the emergency department staff to call hospitalist service for admission.  HOSPITAL COURSE:   *Acute kidney injury secondary dehydration from vomiting and diarrhea secondary to gastroenteritis. resolved with IV fluids. *Gastroenteritis.  Resolved by time he arrived at the floor.  Afebrile.  Normal WBC.  No blood in stool. *With recent AICD placement cardiology consulted.  Outpatient follow-up advised. *Chronic systolic CHF is stable.  No signs of fluid overload during hospital stay. Hypothyroidism, CAD, hyperlipidemia, gout stable.  Medications remain unchanged.  Patient discharged home in stable condition with his acute kidney injury and gastroenteritis resolving.  Follow-up with primary care physician in a week.  CONSULTS OBTAINED:  Treatment Team:  Yolonda Kida, MD  DRUG ALLERGIES:   Allergies  Allergen Reactions  . Lisinopril Cough    DISCHARGE MEDICATIONS:   Allergies as of 12/28/2018      Reactions   Lisinopril Cough      Medication List    TAKE these medications   acetaminophen 325 MG tablet Commonly known as:  TYLENOL Take 2 tablets (650 mg total) by mouth every 6 (six) hours as needed. What changed:    how much to take  reasons to take this   albuterol 108 (90 Base) MCG/ACT inhaler Commonly known as:  PROVENTIL HFA;VENTOLIN HFA Inhale 2 puffs into the lungs every 4 (four) hours as needed for wheezing or shortness of breath.   allopurinol 100 MG tablet Commonly known as:  ZYLOPRIM Take 2  tablets (200 mg total) by mouth daily. What changed:    when to take this  reasons to take this   aspirin 81 MG EC tablet Take 1 tablet (81 mg total) by mouth daily.   atorvastatin 40 MG tablet Commonly known as:  LIPITOR Take 80 mg by mouth daily at 6 PM.   CAMPHOR-EUCALYPTUS-MENTHOL EX Apply 1 application topically as needed (congestion).   carvedilol 3.125 MG tablet Commonly  known as:  COREG Take 1 tablet (3.125 mg total) by mouth 2 (two) times daily with a meal. What changed:    how much to take  when to take this   clopidogrel 75 MG tablet Commonly known as:  PLAVIX Take 1 tablet (75 mg total) by mouth daily.   colchicine 0.6 MG tablet Take 1 tablet (0.6 mg total) by mouth daily as needed (gout flare).   cyclobenzaprine 5 MG tablet Commonly known as:  FLEXERIL Take 1 tablet (5 mg total) by mouth 3 (three) times daily as needed (jaw pain).   diclofenac sodium 1 % Gel Commonly known as:  VOLTAREN Apply 2 g topically 4 (four) times daily as needed (pain).   fluticasone 50 MCG/ACT nasal spray Commonly known as:  FLONASE Place 2 sprays into both nostrils daily. What changed:    when to take this  reasons to take this   fluticasone-salmeterol 115-21 MCG/ACT inhaler Commonly known as:  ADVAIR HFA Inhale 2 puffs into the lungs 2 (two) times daily.   ICY HOT EX Apply 1 application topically daily as needed (pain).   meloxicam 15 MG tablet Commonly known as:  MOBIC Take 15 mg by mouth daily as needed.   methimazole 10 MG tablet Commonly known as:  TAPAZOLE Take 4 tablets (40 mg total) by mouth daily.   metolazone 2.5 MG tablet Commonly known as:  ZAROXOLYN Take 2.5 mg by mouth as needed.   multivitamin with minerals Tabs tablet Take 1 tablet by mouth daily.   nitroGLYCERIN 0.4 MG SL tablet Commonly known as:  NITROSTAT Place 1 tablet (0.4 mg total) under the tongue every 5 (five) minutes as needed for chest pain.   ondansetron 4 MG disintegrating tablet Commonly known as:  ZOFRAN ODT Take 1 tablet (4 mg total) by mouth every 8 (eight) hours as needed for nausea or vomiting.   potassium chloride SA 20 MEQ tablet Commonly known as:  K-DUR,KLOR-CON Take 1 tablet (20 mEq total) by mouth 4 (four) times daily. What changed:  when to take this   promethazine 12.5 MG tablet Commonly known as:  PHENERGAN Take 1 tablet (12.5 mg total)  by mouth every 6 (six) hours as needed for nausea or vomiting.   sildenafil 20 MG tablet Commonly known as:  REVATIO Take by mouth as needed.   Tiotropium Bromide Monohydrate 2.5 MCG/ACT Aers Commonly known as:  SPIRIVA RESPIMAT Inhale 1 puff into the lungs daily.   torsemide 20 MG tablet Commonly known as:  DEMADEX Take 2 tablets (40 mg total) by mouth daily. What changed:  when to take this       Today   VITAL SIGNS:  Blood pressure 113/73, pulse (!) 42, temperature 98.3 F (36.8 C), temperature source Oral, resp. rate 17, height 6\' 1"  (1.854 m), weight 99.8 kg, SpO2 100 %.  I/O:  No intake or output data in the 24 hours ending 01/13/19 1635  PHYSICAL EXAMINATION:  Physical Exam  GENERAL:  53 y.o.-year-old patient lying in the bed with no acute distress.  LUNGS:  Normal breath sounds bilaterally, no wheezing, rales,rhonchi or crepitation. No use of accessory muscles of respiration.  CARDIOVASCULAR: S1, S2 normal. No murmurs, rubs, or gallops.  ABDOMEN: Soft, non-tender, non-distended. Bowel sounds present. No organomegaly or mass.  NEUROLOGIC: Moves all 4 extremities. PSYCHIATRIC: The patient is alert and oriented x 3.  SKIN: No obvious rash, lesion, or ulcer.   DATA REVIEW:   CBC No results for input(s): WBC, HGB, HCT, PLT in the last 168 hours.  Chemistries  No results for input(s): NA, K, CL, CO2, GLUCOSE, BUN, CREATININE, CALCIUM, MG, AST, ALT, ALKPHOS, BILITOT in the last 168 hours.  Invalid input(s): GFRCGP  Cardiac Enzymes No results for input(s): TROPONINI in the last 168 hours.  Microbiology Results  Results for orders placed or performed during the hospital encounter of 12/03/18  MRSA PCR Screening     Status: None   Collection Time: 12/04/18  3:37 AM  Result Value Ref Range Status   MRSA by PCR NEGATIVE NEGATIVE Final    Comment:        The GeneXpert MRSA Assay (FDA approved for NASAL specimens only), is one component of a comprehensive MRSA  colonization surveillance program. It is not intended to diagnose MRSA infection nor to guide or monitor treatment for MRSA infections. Performed at [facility], [address] [place], [place] [address]   Culture, sputum-assessment     Status: None   Collection Time: 12/04/18  4:28 AM  Result Value Ref Range Status   Specimen Description SPUTUM  Final   Special Requests NONE  Final   Sputum evaluation   Final    THIS SPECIMEN IS ACCEPTABLE FOR SPUTUM CULTURE Performed at [facility], [address], [place], [place] [address]    Report Status 12/04/2018 FINAL  Final  Culture, respiratory     Status: None   Collection Time: 12/04/18  4:28 AM  Result Value Ref Range Status   Specimen Description   Final    SPUTUM Performed at [facility], [address], [place], [place] [address]    Special Requests   Final    NONE Reflexed from [id number] Performed at [facility], [address] [place], [place] [address]    Gram Stain   Final    RARE WBC PRESENT,BOTH PMN AND MONONUCLEAR FEW SQUAMOUS EPITHELIAL CELLS PRESENT ABUNDANT GRAM POSITIVE COCCI FEW GRAM POSITIVE RODS FEW GRAM NEGATIVE RODS    Culture   Final    ABUNDANT Consistent with normal respiratory flora. Performed at [facility], [address], [place], [place] [address]    Report Status 12/06/2018 FINAL  Final  Culture, blood (routine x 2) Call MD if unable to obtain prior to antibiotics being given     Status: None   Collection Time: 12/04/18  6:11 AM  Result Value Ref Range Status   Specimen Description BLOOD RIGHT FA  Final   Special Requests   Final    BOTTLES DRAWN AEROBIC AND ANAEROBIC Blood Culture adequate volume   Culture   Final    NO GROWTH 5 DAYS Performed at [facility], [address], [place], [place] [address]    Report Status 12/09/2018 FINAL  Final  Culture, blood (routine x 2) Call MD if unable to obtain prior to antibiotics being  given     Status: None   Collection Time: 12/04/18  6:19 AM  Result Value Ref Range Status   Specimen Description BLOOD LEFT FA  Final  Special Requests   Final    BOTTLES DRAWN AEROBIC AND ANAEROBIC Blood Culture results may not be optimal due to an excessive volume of blood received in culture bottles   Culture   Final    NO GROWTH 5 DAYS Performed at [facility], [address] [place], [place] [address]    Report Status 12/09/2018 FINAL  Final    RADIOLOGY:  No results found.  Follow up with PCP in 1 week.  Management plans discussed with the patient, family and they are in agreement.  CODE STATUS:  Code Status History    Date Active Date Inactive Code Status Order ID Comments User Context   12/27/2018 0600 12/28/2018 1829 Partial Code [id number]  Harrie Foreman, MD Inpatient   12/04/2018 0437 12/10/2018 2037 Partial Code [id number]  Bradly Bienenstock, NP Inpatient   12/04/2018 0147 12/04/2018 0437 Full Code [id number]  Henreitta Leber, MD ED   08/19/2018 2248 08/20/2018 2258 Full Code [id number]  Amelia Jo, MD Inpatient   06/30/2018 1757 07/06/2018 1647 Full Code [id number]  [name], MD Inpatient   06/10/2018 1606 06/11/2018 1537 Full Code [id number]  Thompson Grayer, MD Inpatient   12/23/2017 1153 12/31/2017 1729 Full Code [id number]  Erlene Quan, PA-C Inpatient   12/20/2017 1851 12/23/2017 1110 Full Code [id number]  [name], MD Inpatient   09/08/2017 0644 09/10/2017 2127 Full Code [id number]  Harrie Foreman, MD Inpatient   07/29/2017 1044 07/31/2017 2152 Full Code [id number]  Henreitta Leber, MD Inpatient   06/19/2017 0027 06/20/2017 2114 Full Code [id number]  Lance Coon, MD ED   05/20/2017 1226 05/21/2017 1759 Full Code [id number]  [name], MD Inpatient   06/22/2016 0430 06/22/2016 2150 Full Code [id number]  Quintella Baton, MD Inpatient   06/04/2016 1843 06/06/2016 1815 Full Code [id number]  [name], MD Inpatient    Questions for Most Recent  Historical Code Status (Order [id number])    Question Answer Comment   In the event of cardiac or respiratory ARREST: Initiate Code Blue, Call Rapid Response Yes    In the event of cardiac or respiratory ARREST: Perform CPR Yes    In the event of cardiac or respiratory ARREST: Perform Intubation/Mechanical Ventilation No    In the event of cardiac or respiratory ARREST: Use NIPPV/BiPAp only if indicated Yes    In the event of cardiac or respiratory ARREST: Administer ACLS medications if indicated Yes    In the event of cardiac or respiratory ARREST: Perform Defibrillation or Cardioversion if indicated Yes       TOTAL TIME TAKING CARE OF THIS PATIENT ON DAY OF DISCHARGE: more than 30 minutes.   Neita Carp M.D on 01/13/2019 at 4:35 PM  Between 7am to 6pm - Pager - [phone]  After 6pm go to [email or web address] - password [email or web address] [facility]  Office  [phone]  CC: Primary care physician; Valerie Roys, DO  Note: This dictation was prepared with Dragon dictation along with smaller phrase technology. Any transcriptional errors that result from this process are unintentional.

## 2019-01-14 ENCOUNTER — Other Ambulatory Visit: Payer: Self-pay | Admitting: *Deleted

## 2019-01-14 MED ORDER — POTASSIUM CHLORIDE CRYS ER 20 MEQ PO TBCR: 20.0000 meq | EXTENDED_RELEASE_TABLET | Freq: Four times a day (QID) | ORAL | 0 refills | Status: DC

## 2019-01-14 NOTE — Telephone Encounter (Signed)
Stephen [name],   Patient left the ED and was told to take potassium 20MEQ 4 times a day  Are you okay with me refilling this?

## 2019-01-14 NOTE — Telephone Encounter (Signed)
Ok to refill. We see him next week.

## 2019-01-14 NOTE — Telephone Encounter (Signed)
Patient returning call Takes potassium 20 MEQ - 4 tablets a day  States that when he was in the hospital his provider (does not remember name) told him to start taking 4 tablets a day so he has been since

## 2019-01-14 NOTE — Telephone Encounter (Signed)
Lmovm to contact office to verify current Potassium dosage and how pt is taking medication.

## 2019-01-20 ENCOUNTER — Encounter: Payer: Self-pay | Admitting: Internal Medicine

## 2019-01-20 ENCOUNTER — Ambulatory Visit (INDEPENDENT_AMBULATORY_CARE_PROVIDER_SITE_OTHER): Payer: Commercial Managed Care - PPO | Admitting: Internal Medicine

## 2019-01-20 VITALS — BP 100/70 | HR 87 | Temp 83.0°F | Ht 73.5 in | Wt 233.0 lb

## 2019-01-20 DIAGNOSIS — I472 Ventricular tachycardia, unspecified: Secondary | ICD-10-CM

## 2019-01-20 DIAGNOSIS — I5022 Chronic systolic (congestive) heart failure: Secondary | ICD-10-CM

## 2019-01-20 DIAGNOSIS — I428 Other cardiomyopathies: Secondary | ICD-10-CM

## 2019-01-20 DIAGNOSIS — I493 Ventricular premature depolarization: Secondary | ICD-10-CM

## 2019-01-20 DIAGNOSIS — Z95810 Presence of automatic (implantable) cardiac defibrillator: Secondary | ICD-10-CM | POA: Diagnosis not present

## 2019-01-20 MED ORDER — AMIODARONE HCL 200 MG PO TABS: 200.0000 mg | ORAL_TABLET | Freq: Every day | ORAL | Status: DC

## 2019-01-20 NOTE — Patient Instructions (Signed)
Medication Instructions:  -  Your physician has recommended you make the following change in your medication:   1) RESTART amiodarone 200 mg- take 1 tablet by mouth once daily  If you need a refill on your cardiac medications before your next appointment, please call your pharmacy.   Lab work: - none ordered  If you have labs (blood work) drawn today and your tests are completely normal, you will receive your results only by: Marland Kitchen MyChart Message (if you have MyChart) OR . A paper copy in the mail If you have any lab test that is abnormal or we need to change your treatment, we will call you to review the results.  Testing/Procedures: - none ordered  Follow-Up: At [facility], you and your health needs are our priority.  As part of our continuing mission to provide you with exceptional heart care, we have created designated Provider Care Teams.  These Care Teams include your primary Cardiologist (physician) and Advanced Practice Providers (APPs -  Physician Assistants and Nurse Practitioners) who all work together to provide you with the care you need, when you need it. . in 3 months with [name].  Any Other Special Instructions Will Be Listed Below (If Applicable). - N/A

## 2019-01-20 NOTE — Progress Notes (Signed)
Patient Care Team: Valerie Roys, DO as PCP - General (Family Medicine) [name], FNP as Nurse Practitioner (Family Medicine) Yolonda Kida, MD as Consulting Physician (Cardiology)   HPI  Stephen [name] is a 53 y.o. male Seen with a history of PVCs and a nonischemic cardiomyopathy and implanted with [employer] ICD.  He also has a history of a TIA and remote syncope.  Initial evaluation 12/18 ventricular ectopy was noted with the plan to use amiodarone for suppression of the ectopy and then reassessment of LV function  Unfortunately he then had a cardiac arrest from which he recovered; underwent ICD implantation 12/18  Intercurrently, syncope with VT/VF Mex resumed with amio  Amio.mexiletene failed to suppress PVC>> JA ablation 6/19. Poor result and referred to [facility] [place]>>   The patient is intercurrently developed left bundle branch block; temporally relates to his ablation procedure  8/19 he was hospitalized at [facility] for defibrillator shocks in the context of potassium of 2.5..  I had reached out to [facility] for transfer.  The patient chose to transfer his care to [name].  There was no further contact with [facility].   Hospitalized at [facility] again 12/19.  Amiodarone was discontinued because of suppressed TSH    DATE TEST EF   10/18 Echo   25 % Mod LAE MR  12/18  cMRI  16 % Hypokinetic RV midwall gadolinium  7/19 Echo 10-15 %    Date Cr K TSH LFTs  4/19 1.38 3.9 1.73 (2/19) 13(2/19)   10/19 2.05 3.9 0.134>> 0.070  24       Regarding ablation, he would prefer to go to [place] declining JA's recommendation for Dr. pH.  I reached out spoke with Dr. Omelia Blackwater and is given me Dr. Zettie Cooley name--the patient then declined      Now without chest pain or sob, but no arrhythmia or palpitations  Recurrent VT monomorphic-- over the weekend with [facility]  Records and Results Reviewed  Hospital records  Past Medical History:  Diagnosis Date  . AICD (automatic  cardioverter/defibrillator) present   . AKI (acute kidney injury) ([place]) 12/24/2017  . Arrhythmia   . Arthritis    "lower back, knees" (06/10/2018)  . Cardiac arrest ([place]) 12/23/2017   Brief V-fib arrest  . Chest pain 09/08/2017  . CHF (congestive heart failure) (HCC)    nonischemic cardiomyopathy, EF 25%  . Gout    "on daily RX" (06/10/2018)  . Headache    "q couple months" (06/10/2018)  . High cholesterol   . History of kidney stones   . Hypertension   . Influenza A 12/24/2017  . NICM (nonischemic cardiomyopathy) ([place])   . Pneumonia 12/20/2017  . TIA (transient ischemic attack) 06/21/2016   "still affects my memory a little bit" (06/10/2018)    Past Surgical History:  Procedure Laterality Date  . EXTERNAL FIXATION LEG Right ~ 2000   "was going bowlegged; had to brake my leg to fix it"  . HERNIA REPAIR    . ICD IMPLANT N/A 12/30/2017   Procedure: ICD IMPLANT;  Surgeon: Deboraha Sprang, MD;  Location: [place] [facility];  Service: Cardiovascular;  Laterality: N/A;  . LEFT HEART CATH AND CORONARY ANGIOGRAPHY N/A 09/09/2017   Procedure: LEFT HEART CATH AND CORONARY ANGIOGRAPHY;  Surgeon: Teodoro Spray, MD;  Location: [place] [facility];  Service: Cardiovascular;  Laterality: N/A;  . UMBILICAL HERNIA REPAIR  1990s  . V TACH ABLATION  06/10/2018  .  V TACH ABLATION N/A 06/10/2018   Procedure: V TACH ABLATION;  Surgeon: Thompson Grayer, MD;  Location: [place] [facility];  Service: Cardiovascular;  Laterality: N/A;  . VASECTOMY      Current Outpatient Medications  Medication Sig Dispense Refill  . acetaminophen (TYLENOL) 325 MG tablet Take 2 tablets (650 mg total) by mouth every 6 (six) hours as needed. (Patient taking differently: Take 325-650 mg by mouth every 6 (six) hours as needed for moderate pain. ) 60 tablet 0  . albuterol (PROVENTIL HFA;VENTOLIN HFA) 108 (90 Base) MCG/ACT inhaler Inhale 2 puffs into the lungs every 4 (four) hours as needed for wheezing or shortness of breath. 1  Inhaler 0  . allopurinol (ZYLOPRIM) 100 MG tablet Take 2 tablets (200 mg total) by mouth daily. (Patient taking differently: Take 200 mg by mouth daily as needed. ) 60 tablet 6  . aspirin EC 81 MG EC tablet Take 1 tablet (81 mg total) by mouth daily. 90 tablet 3  . atorvastatin (LIPITOR) 40 MG tablet Take 80 mg by mouth daily at 6 PM.   6  . CAMPHOR-EUCALYPTUS-MENTHOL EX Apply 1 application topically as needed (congestion).    . carvedilol (COREG) 3.125 MG tablet Take 1 tablet (3.125 mg total) by mouth 2 (two) times daily with a meal. (Patient taking differently: Take 6.25 mg by mouth daily. ) 60 tablet 0  . clopidogrel (PLAVIX) 75 MG tablet Take 1 tablet (75 mg total) by mouth daily. 30 tablet 5  . colchicine 0.6 MG tablet Take 1 tablet (0.6 mg total) by mouth daily as needed (gout flare). 6 tablet 1  . cyclobenzaprine (FLEXERIL) 5 MG tablet Take 1 tablet (5 mg total) by mouth 3 (three) times daily as needed (jaw pain). 20 tablet 0  . diclofenac sodium (VOLTAREN) 1 % GEL Apply 2 g topically 4 (four) times daily as needed (pain).     . fluticasone (FLONASE) 50 MCG/ACT nasal spray Place 2 sprays into both nostrils daily. (Patient taking differently: Place 2 sprays into both nostrils daily as needed for allergies. ) 16 g 2  . fluticasone-salmeterol (ADVAIR HFA) 115-21 MCG/ACT inhaler Inhale 2 puffs into the lungs 2 (two) times daily. 1 Inhaler 12  . meloxicam (MOBIC) 15 MG tablet Take 15 mg by mouth daily as needed.     . Menthol, Topical Analgesic, (ICY HOT EX) Apply 1 application topically daily as needed (pain).    . methimazole (TAPAZOLE) 10 MG tablet Take 4 tablets (40 mg total) by mouth daily. 120 tablet 0  . metolazone (ZAROXOLYN) 2.5 MG tablet Take 2.5 mg by mouth as needed.   11  . Multiple Vitamin (MULTIVITAMIN WITH MINERALS) TABS tablet Take 1 tablet by mouth daily.    . nitroGLYCERIN (NITROSTAT) 0.4 MG SL tablet Place 1 tablet (0.4 mg total) under the tongue every 5 (five) minutes as  needed for chest pain. 25 tablet 3  . ondansetron (ZOFRAN ODT) 4 MG disintegrating tablet Take 1 tablet (4 mg total) by mouth every 8 (eight) hours as needed for nausea or vomiting. 20 tablet 0  . potassium chloride SA (K-DUR,KLOR-CON) 20 MEQ tablet Take 1 tablet (20 mEq total) by mouth 4 (four) times daily. 120 tablet 0  . promethazine (PHENERGAN) 12.5 MG tablet Take 1 tablet (12.5 mg total) by mouth every 6 (six) hours as needed for nausea or vomiting. 20 tablet 0  . sacubitril-valsartan (ENTRESTO) 49-51 MG Take 1 tablet by mouth 2 (two) times daily.    Marland Kitchen  sildenafil (REVATIO) 20 MG tablet Take by mouth as needed.     . Tiotropium Bromide Monohydrate (SPIRIVA RESPIMAT) 2.5 MCG/ACT AERS Inhale 1 puff into the lungs daily. 1 Inhaler 12  . torsemide (DEMADEX) 20 MG tablet Take 2 tablets (40 mg total) by mouth daily. (Patient taking differently: Take 40 mg by mouth 2 (two) times daily. ) 60 tablet 0   No current facility-administered medications for this visit.     Allergies  Allergen Reactions  . Lisinopril Cough      Review of Systems negative except from HPI and PMH  Physical Exam BP 100/70 (BP Location: Left Arm, Patient Position: Sitting, Cuff Size: Normal)   Pulse 87   Temp (!) 83 F (28.3 C)   Ht 6' 1.5" (1.867 m)   Wt 233 lb (105.7 kg)   BMI 30.32 kg/m  Well developed and nourished in no acute distress HENT normal Neck supple with JVP-flat Clear Regular rate and rhythm, no murmurs or gallops Abd-soft with active BS No Clubbing cyanosis edema Skin-warm and dry A & Oriented  Grossly normal sensory and motor function  ECG sinus at  87 24/16/40 LBBB PVC in pattern of bigeminy  Cardiomyopathy-nonischemic  Congestive heart failure-class IIb-IIIa  Sleep disordered breathing and daytime somnolence question sleep apnea  LBBB- iatrogenic  High Risk Medication Surveillance  PVCs/nonsustained ventricular tachycardia-  Hyperthyroidism    The patient has had a heavy  burden of ventricular tachycardia requiring dual antiarrhythmic therapies with amiodarone and mexiletine.  Things have been relatively quiet since his ablation with infrequent treatments.  However, there is still this fear of shocks that is real for him  Further PVC suppression is going to be important as it risks aggravating his cardiomyopathy  We will resume his amiodarone.  He presumably will need concomitant therapy for hyperthyroidism- We will recheck his TSH  Functional status is stable  Will need to keep CRT upgrade on the table

## 2019-01-20 NOTE — Telephone Encounter (Signed)
[employer] forms completed by [name] and given to the front desk to return to [employer].

## 2019-01-20 NOTE — Telephone Encounter (Signed)
Forms sent to [employer] via interoffice mail

## 2019-01-21 LAB — CUP PACEART INCLINIC DEVICE CHECK
Battery Remaining Longevity: 130 mo
Battery Voltage: 3.02 V
Brady Statistic RV Percent Paced: 0.05 %
Date Time Interrogation Session: 20200121175351
HighPow Impedance: 77 Ohm
Implantable Lead Implant Date: 20181231
Implantable Pulse Generator Implant Date: 20181231
Lead Channel Impedance Value: 342 Ohm
Lead Channel Impedance Value: 418 Ohm
Lead Channel Pacing Threshold Pulse Width: 1 ms
Lead Channel Sensing Intrinsic Amplitude: 4.75 mV
Lead Channel Setting Pacing Amplitude: 2.5 V
Lead Channel Setting Pacing Pulse Width: 1 ms
Lead Channel Setting Sensing Sensitivity: 0.3 mV
MDC IDC LEAD LOCATION: 753860
MDC IDC MSMT LEADCHNL RV PACING THRESHOLD AMPLITUDE: 1.25 V

## 2019-01-24 ENCOUNTER — Other Ambulatory Visit: Payer: Self-pay

## 2019-01-24 ENCOUNTER — Emergency Department
Admission: EM | Admit: 2019-01-24 | Discharge: 2019-01-24 | Disposition: A | Discharge status: Home / Self Care | Payer: Commercial Managed Care - PPO | Attending: Emergency Medicine | Admitting: Emergency Medicine

## 2019-01-24 DIAGNOSIS — R197 Diarrhea, unspecified: Secondary | ICD-10-CM | POA: Insufficient documentation

## 2019-01-24 DIAGNOSIS — Z79899 Other long term (current) drug therapy: Secondary | ICD-10-CM | POA: Insufficient documentation

## 2019-01-24 DIAGNOSIS — Z87891 Personal history of nicotine dependence: Secondary | ICD-10-CM | POA: Insufficient documentation

## 2019-01-24 DIAGNOSIS — R112 Nausea with vomiting, unspecified: Secondary | ICD-10-CM

## 2019-01-24 DIAGNOSIS — I5022 Chronic systolic (congestive) heart failure: Secondary | ICD-10-CM | POA: Insufficient documentation

## 2019-01-24 DIAGNOSIS — Z7982 Long term (current) use of aspirin: Secondary | ICD-10-CM | POA: Diagnosis not present

## 2019-01-24 DIAGNOSIS — J449 Chronic obstructive pulmonary disease, unspecified: Secondary | ICD-10-CM | POA: Diagnosis not present

## 2019-01-24 DIAGNOSIS — R109 Unspecified abdominal pain: Secondary | ICD-10-CM | POA: Diagnosis not present

## 2019-01-24 DIAGNOSIS — Z7902 Long term (current) use of antithrombotics/antiplatelets: Secondary | ICD-10-CM | POA: Diagnosis not present

## 2019-01-24 DIAGNOSIS — N183 Chronic kidney disease, stage 3 (moderate): Secondary | ICD-10-CM | POA: Diagnosis not present

## 2019-01-24 DIAGNOSIS — I130 Hypertensive heart and chronic kidney disease with heart failure and stage 1 through stage 4 chronic kidney disease, or unspecified chronic kidney disease: Secondary | ICD-10-CM | POA: Diagnosis not present

## 2019-01-24 LAB — URINALYSIS, COMPLETE (UACMP) WITH MICROSCOPIC
Bacteria, UA: NONE SEEN
Bilirubin Urine: NEGATIVE
GLUCOSE, UA: NEGATIVE mg/dL
Hgb urine dipstick: NEGATIVE
KETONES UR: NEGATIVE mg/dL
Leukocytes, UA: NEGATIVE
Nitrite: NEGATIVE
Protein, ur: NEGATIVE mg/dL
Specific Gravity, Urine: 1.014 (ref 1.005–1.030)
pH: 5 (ref 5.0–8.0)

## 2019-01-24 LAB — CBC
HEMATOCRIT: 43.6 % (ref 39.0–52.0)
HEMOGLOBIN: 14.3 g/dL (ref 13.0–17.0)
MCH: 28.3 pg (ref 26.0–34.0)
MCHC: 32.8 g/dL (ref 30.0–36.0)
MCV: 86.3 fL (ref 80.0–100.0)
Platelets: 252 10*3/uL (ref 150–400)
RBC: 5.05 MIL/uL (ref 4.22–5.81)
RDW: 15.8 % — ABNORMAL HIGH (ref 11.5–15.5)
WBC: 10.1 10*3/uL (ref 4.0–10.5)
nRBC: 0 % (ref 0.0–0.2)

## 2019-01-24 LAB — COMPREHENSIVE METABOLIC PANEL
ALBUMIN: 4 g/dL (ref 3.5–5.0)
ALT: 13 U/L (ref 0–44)
AST: 18 U/L (ref 15–41)
Alkaline Phosphatase: 109 U/L (ref 38–126)
Anion gap: 10 (ref 5–15)
BUN: 37 mg/dL — ABNORMAL HIGH (ref 6–20)
CO2: 23 mmol/L (ref 22–32)
Calcium: 9.6 mg/dL (ref 8.9–10.3)
Chloride: 104 mmol/L (ref 98–111)
Creatinine, Ser: 1.85 mg/dL — ABNORMAL HIGH (ref 0.61–1.24)
GFR calc Af Amer: 47 mL/min — ABNORMAL LOW (ref >60)
GFR calc non Af Amer: 41 mL/min — ABNORMAL LOW (ref >60)
GLUCOSE: 134 mg/dL — AB (ref 70–99)
Potassium: 3.8 mmol/L (ref 3.5–5.1)
Sodium: 137 mmol/L (ref 135–145)
Total Bilirubin: 0.8 mg/dL (ref 0.3–1.2)
Total Protein: 7.7 g/dL (ref 6.5–8.1)

## 2019-01-24 LAB — LIPASE, BLOOD: Lipase: 23 U/L (ref 11–51)

## 2019-01-24 MED ORDER — SODIUM CHLORIDE 0.9 % IV BOLUS
1000.0000 mL | Freq: Once | INTRAVENOUS | Status: AC
  Administered 2019-01-24: 1000 mL via INTRAVENOUS

## 2019-01-24 MED ORDER — ONDANSETRON 4 MG PO TBDP: 4.0000 mg | ORAL_TABLET | Freq: Three times a day (TID) | ORAL | 0 refills | Status: DC | PRN

## 2019-01-24 MED ORDER — ONDANSETRON HCL 4 MG/2ML IJ SOLN
4.0000 mg | Freq: Once | INTRAMUSCULAR | Status: AC
  Administered 2019-01-24: 4 mg via INTRAVENOUS
  Filled 2019-01-24: qty 2

## 2019-01-24 NOTE — ED Provider Notes (Signed)
[facility] Emergency Department Provider Note  Time seen: 6:10 PM  I have reviewed the triage vital signs and the nursing notes.   HISTORY  Chief Complaint Emesis and Diarrhea    HPI Stephen [name] is a 53 y.o. male with a past medical history of cardiac arrest status post AICD, CHF, hypertension, presents to the emergency department for nausea vomiting diarrhea.  According to the patient around 3:00 this morning he awoke with nausea vomiting diarrhea.  States his last episode of diarrhea and vomiting was approximately 3 to 4 hours ago.  Patient states approximately 1 year ago he developed similar symptoms of nausea vomiting diarrhea and ultimately was shocked from his defibrillator for low potassium.  Patient is concerned that the potassium could be low which is why he came to the emergency department for evaluation.  Patient denies any chest pain or shortness of breath.  No abdominal pain.  States continued moderate nausea.   Past Medical History:  Diagnosis Date  . AICD (automatic cardioverter/defibrillator) present   . AKI (acute kidney injury) ([place]) 12/24/2017  . Arrhythmia   . Arthritis    "lower back, knees" (06/10/2018)  . Cardiac arrest ([place]) 12/23/2017   Brief V-fib arrest  . Chest pain 09/08/2017  . CHF (congestive heart failure) (HCC)    nonischemic cardiomyopathy, EF 25%  . Gout    "on daily RX" (06/10/2018)  . Headache    "q couple months" (06/10/2018)  . High cholesterol   . History of kidney stones   . Hypertension   . Influenza A 12/24/2017  . NICM (nonischemic cardiomyopathy) ([place])   . Pneumonia 12/20/2017  . TIA (transient ischemic attack) 06/21/2016   "still affects my memory a little bit" (06/10/2018)    Patient Active Problem List   Diagnosis Date Noted  . AKI (acute kidney injury) ([place]) 12/27/2018  . 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 HTN (hypertension) 11/20/2018  . Hypokalemia 08/19/2018  . Syncope  06/30/2018  . CKD (chronic kidney disease) stage 3, GFR 30-59 ml/min (HCC) 05/13/2018  . Hyperlipidemia 05/13/2018  . ED (erectile dysfunction) 05/13/2018  . Osteoarthritis of right knee 04/30/2018  . Gout 03/26/2018  . VF (ventricular fibrillation) ([place])   . Nonischemic cardiomyopathy ([place])   . Cardiogenic shock ([place]) 12/24/2017  . 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 History of non-ST elevation myocardial infarction (NSTEMI) 09/08/2017  . Bradycardia 08/30/2017  . Ventricular tachycardia ([place]) 07/29/2017  . COPD (chronic obstructive pulmonary disease) ([place]) 07/04/2017  . TIA (transient ischemic attack) 06/22/2016  . Benign hypertensive renal disease 06/22/2016  . Acute on chronic systolic CHF (congestive heart failure) ([place]) 06/04/2016  . Chronic systolic congestive heart failure ([place]) 01/20/2016  . Cardiomyopathy ([place]) 01/20/2016    Past Surgical History:  Procedure Laterality Date  . EXTERNAL FIXATION LEG Right ~ 2000   "was going bowlegged; had to brake my leg to fix it"  . HERNIA REPAIR    . ICD IMPLANT N/A 12/30/2017   Procedure: ICD IMPLANT;  Surgeon: Deboraha Sprang, MD;  Location: [facility];  Service: Cardiovascular;  Laterality: N/A;  . LEFT HEART CATH AND CORONARY ANGIOGRAPHY N/A 09/09/2017   Procedure: LEFT HEART CATH AND CORONARY ANGIOGRAPHY;  Surgeon: Teodoro Spray, MD;  Location: [facility];  Service: Cardiovascular;  Laterality: N/A;  . UMBILICAL HERNIA REPAIR  1990s  . V TACH ABLATION  06/10/2018  . V TACH ABLATION N/A 06/10/2018   Procedure: V TACH ABLATION;  Surgeon:  Thompson Grayer, MD;  Location: [facility];  Service: Cardiovascular;  Laterality: N/A;  . VASECTOMY      Prior to Admission medications   Medication Sig Start Date End Date Taking? Authorizing Provider  acetaminophen (TYLENOL) 325 MG tablet Take 2 tablets (650 mg total) by mouth every 6 (six) hours as needed. Patient taking differently: Take 325-650 mg by mouth every 6 (six)  hours as needed for moderate pain.  03/19/18   Carrie Mew, MD  albuterol (PROVENTIL HFA;VENTOLIN HFA) 108 (90 Base) MCG/ACT inhaler Inhale 2 puffs into the lungs every 4 (four) hours as needed for wheezing or shortness of breath. 09/19/18   Johnson, Megan P, DO  allopurinol (ZYLOPRIM) 100 MG tablet Take 2 tablets (200 mg total) by mouth daily. Patient taking differently: Take 200 mg by mouth daily as needed.  10/19/18   Johnson, Megan P, DO  amiodarone (PACERONE) 200 MG tablet Take 1 tablet (200 mg total) by mouth daily. 01/20/19   Deboraha Sprang, MD  aspirin EC 81 MG EC tablet Take 1 tablet (81 mg total) by mouth daily. 01/01/18   Daune Perch, NP  atorvastatin (LIPITOR) 40 MG tablet Take 80 mg by mouth daily at 6 PM.  10/29/18   [provider]  CAMPHOR-EUCALYPTUS-MENTHOL EX Apply 1 application topically as needed (congestion).    [provider]  carvedilol (COREG) 3.125 MG tablet Take 1 tablet (3.125 mg total) by mouth 2 (two) times daily with a meal. Patient taking differently: Take 6.25 mg by mouth daily.  07/06/18   Loletha Grayer, MD  clopidogrel (PLAVIX) 75 MG tablet Take 1 tablet (75 mg total) by mouth daily. 01/30/18   Bensimhon, Shaune Pascal, MD  colchicine 0.6 MG tablet Take 1 tablet (0.6 mg total) by mouth daily as needed (gout flare). 09/03/18   Johnson, Megan P, DO  cyclobenzaprine (FLEXERIL) 5 MG tablet Take 1 tablet (5 mg total) by mouth 3 (three) times daily as needed (jaw pain). 11/24/18   Nance Pear, MD  diclofenac sodium (VOLTAREN) 1 % GEL Apply 2 g topically 4 (four) times daily as needed (pain).     [provider]  fluticasone (FLONASE) 50 MCG/ACT nasal spray Place 2 sprays into both nostrils daily. Patient taking differently: Place 2 sprays into both nostrils daily as needed for allergies.  06/20/17   [name], MD  fluticasone-salmeterol (ADVAIR HFA) [phone] MCG/ACT inhaler Inhale 2 puffs into the lungs 2 (two) times daily. 08/12/18    [name], MD  meloxicam (MOBIC) 15 MG tablet Take 15 mg by mouth daily as needed.     [provider]  Menthol, Topical Analgesic, (ICY HOT EX) Apply 1 application topically daily as needed (pain).    [provider]  methimazole (TAPAZOLE) 10 MG tablet Take 4 tablets (40 mg total) by mouth daily. 12/09/18   Mayo, Pete Pelt, MD  metolazone (ZAROXOLYN) 2.5 MG tablet Take 2.5 mg by mouth as needed.  09/05/18   [provider]  Multiple Vitamin (MULTIVITAMIN WITH MINERALS) TABS tablet Take 1 tablet by mouth daily.    [provider]  nitroGLYCERIN (NITROSTAT) 0.4 MG SL tablet Place 1 tablet (0.4 mg total) under the tongue every 5 (five) minutes as needed for chest pain. 02/19/18   Bensimhon, Shaune Pascal, MD  ondansetron (ZOFRAN ODT) 4 MG disintegrating tablet Take 1 tablet (4 mg total) by mouth every 8 (eight) hours as needed for nausea or vomiting. 12/21/18   Earleen Newport, MD  potassium chloride SA (K-DUR,KLOR-CON) 20 MEQ tablet Take 1 tablet (20 mEq total) by mouth 4 (four) times daily. 01/14/19   Deboraha Sprang, MD  promethazine (PHENERGAN) 12.5 MG tablet Take 1 tablet (12.5 mg total) by mouth every 6 (six) hours as needed for nausea or vomiting. 11/24/18   Nance Pear, MD  sacubitril-valsartan (ENTRESTO) 49-51 MG Take 1 tablet by mouth 2 (two) times daily.    [provider]  sildenafil (REVATIO) 20 MG tablet Take by mouth as needed.  10/29/18 10/29/19  [provider]  Tiotropium Bromide Monohydrate (SPIRIVA RESPIMAT) 2.5 MCG/ACT AERS Inhale 1 puff into the lungs daily. 08/12/18   [name], MD  torsemide (DEMADEX) 20 MG tablet Take 2 tablets (40 mg total) by mouth daily. Patient taking differently: Take 40 mg by mouth 2 (two) times daily.  07/06/18   Loletha Grayer, MD    Allergies  Allergen Reactions  . Lisinopril Cough    Family History  Problem Relation Age of Onset  . Hypertension Mother   . Heart failure Mother   .  Hypertension Father   . CAD Father   . Heart attack Father     Social History Social History   Tobacco Use  . Smoking status: Former Smoker    Packs/day: 0.33    Years: 33.00    Pack years: 10.89    Types: Cigarettes    Last attempt to quit: 02/20/2016    Years since quitting: 2.9  . Smokeless tobacco: Never Used  Substance Use Topics  . Alcohol use: Yes    Alcohol/week: 0.0 standard drinks    Comment: 06/10/2018 "beer once/month; if that"  . Drug use: Never    Review of Systems Constitutional: Negative for fever Cardiovascular: Negative for chest pain. Respiratory: Negative for shortness of breath. Gastrointestinal: States mild abdominal cramping this morning denies any pain currently.  States nausea vomiting diarrhea since this morning but the stopped approximately 4 5 hours ago. Genitourinary: Negative for urinary compaints Musculoskeletal: Negative for musculoskeletal complaints Skin: Negative for skin complaints  Neurological: Negative for headache All other ROS negative  ____________________________________________   PHYSICAL EXAM:  VITAL SIGNS: ED Triage Vitals  Enc Vitals Group     BP 01/24/19 1549 (!) 81/63     Pulse Rate 01/24/19 1549 90     Resp 01/24/19 1549 16     Temp 01/24/19 1549 99.3 F (37.4 C)     Temp Source 01/24/19 1549 Oral     SpO2 01/24/19 1549 97 %     Weight 01/24/19 1548 233 lb (105.7 kg)     Height 01/24/19 1548 6\' 1"  (1.854 m)     Head Circumference --      Peak Flow --      Pain Score 01/24/19 1548 0     Pain Loc --      Pain Edu? --      Excl. in [place]? --    Constitutional: Alert and oriented. Well appearing and in no distress. Eyes: Normal exam ENT   Head: Normocephalic and atraumatic   Mouth/Throat: Mucous membranes are moist. Cardiovascular: Normal rate, regular rhythm. Respiratory: Normal respiratory effort without tachypnea nor retractions. Breath sounds are clear  Gastrointestinal: Soft and nontender. No  distention.   Musculoskeletal: Nontender with normal range of motion in all extremities.  Neurologic:  Normal speech and language. No gross focal neurologic deficits  Skin:  Skin is warm, dry and intact.  Psychiatric: Mood and affect are normal.  ____________________________________________    EKG  EKG viewed and interpreted by myself shows sinus rhythm/paced rhythm at 85 bpm, widened QRS, left axis deviation, prolonged PR interval, nonspecific ST changes.  Overall unchanged from prior EKG 12/26/2018.  ____________________________________________   INITIAL IMPRESSION / ASSESSMENT AND PLAN / ED COURSE  Pertinent labs & imaging results that were available during my care of the patient were reviewed by me and considered in my medical decision making (see chart for details).  Patient presents to the emergency department for nausea vomiting diarrhea starting this morning.  Patient's main concern is to make sure his potassium is not low into make sure he does not get defibrillated again like he did last year.  Overall the patient appears very well.  We will check labs, IV hydrate and continue to closely monitor.  Initial blood pressure was read as low however repeat blood pressures have been much more reassuring.  Patient's labs are largely within normal limits including a potassium of 3.8.  Creatinine is 1.85 although not significantly changed from prior values.  Patient feels better after IV fluids.  Patient's blood pressure 103/77.  We will discharge the patient home with Zofran, continue supportive care at home.  Patient agreeable to plan of care.  ____________________________________________   FINAL CLINICAL IMPRESSION(S) / ED DIAGNOSES  Nausea vomiting diarrhea   Harvest Dark, MD 01/24/19 1930

## 2019-01-24 NOTE — ED Notes (Signed)
Pt last had diarrhea 3 hrs ago. Has a pacemaker and states the last time he had diarrhea his defib went off so he is scared it will shock him again.

## 2019-01-24 NOTE — ED Triage Notes (Signed)
Pt states vomiting and diarrhea with hx of defibrillator. Pt states he feels like his potassium is low. States symptoms began this AM. A&O, ambulatory. No distress noted at this time. States 4 51meq potassium pills at home.

## 2019-01-25 ENCOUNTER — Other Ambulatory Visit (HOSPITAL_COMMUNITY): Payer: Self-pay | Admitting: Internal Medicine

## 2019-02-03 ENCOUNTER — Encounter: Payer: Commercial Managed Care - PPO | Admitting: Internal Medicine

## 2019-02-13 ENCOUNTER — Other Ambulatory Visit: Payer: Self-pay

## 2019-02-13 ENCOUNTER — Observation Stay
Admission: EM | Admit: 2019-02-13 | Discharge: 2019-02-14 | Disposition: A | Discharge status: Home / Self Care | Payer: Commercial Managed Care - PPO | Source: Home / Self Care | Attending: Emergency Medicine | Admitting: Emergency Medicine

## 2019-02-13 ENCOUNTER — Other Ambulatory Visit (HOSPITAL_COMMUNITY): Payer: Self-pay | Admitting: Internal Medicine

## 2019-02-13 ENCOUNTER — Emergency Department: Payer: Commercial Managed Care - PPO

## 2019-02-13 DIAGNOSIS — Z791 Long term (current) use of non-steroidal anti-inflammatories (NSAID): Secondary | ICD-10-CM | POA: Insufficient documentation

## 2019-02-13 DIAGNOSIS — E782 Mixed hyperlipidemia: Secondary | ICD-10-CM | POA: Insufficient documentation

## 2019-02-13 DIAGNOSIS — Z888 Allergy status to other drugs, medicaments and biological substances status: Secondary | ICD-10-CM

## 2019-02-13 DIAGNOSIS — M109 Gout, unspecified: Secondary | ICD-10-CM

## 2019-02-13 DIAGNOSIS — Z8673 Personal history of transient ischemic attack (TIA), and cerebral infarction without residual deficits: Secondary | ICD-10-CM

## 2019-02-13 DIAGNOSIS — Z95810 Presence of automatic (implantable) cardiac defibrillator: Secondary | ICD-10-CM

## 2019-02-13 DIAGNOSIS — N183 Chronic kidney disease, stage 3 (moderate): Secondary | ICD-10-CM

## 2019-02-13 DIAGNOSIS — I491 Atrial premature depolarization: Secondary | ICD-10-CM

## 2019-02-13 DIAGNOSIS — I420 Dilated cardiomyopathy: Secondary | ICD-10-CM | POA: Insufficient documentation

## 2019-02-13 DIAGNOSIS — I272 Pulmonary hypertension, unspecified: Secondary | ICD-10-CM

## 2019-02-13 DIAGNOSIS — R9431 Abnormal electrocardiogram [ECG] [EKG]: Secondary | ICD-10-CM | POA: Insufficient documentation

## 2019-02-13 DIAGNOSIS — Z7902 Long term (current) use of antithrombotics/antiplatelets: Secondary | ICD-10-CM | POA: Insufficient documentation

## 2019-02-13 DIAGNOSIS — I472 Ventricular tachycardia: Secondary | ICD-10-CM | POA: Insufficient documentation

## 2019-02-13 DIAGNOSIS — N179 Acute kidney failure, unspecified: Secondary | ICD-10-CM

## 2019-02-13 DIAGNOSIS — I5022 Chronic systolic (congestive) heart failure: Secondary | ICD-10-CM | POA: Insufficient documentation

## 2019-02-13 DIAGNOSIS — R000 Tachycardia, unspecified: Secondary | ICD-10-CM | POA: Diagnosis not present

## 2019-02-13 DIAGNOSIS — Z8674 Personal history of sudden cardiac arrest: Secondary | ICD-10-CM

## 2019-02-13 DIAGNOSIS — I130 Hypertensive heart and chronic kidney disease with heart failure and stage 1 through stage 4 chronic kidney disease, or unspecified chronic kidney disease: Secondary | ICD-10-CM | POA: Insufficient documentation

## 2019-02-13 DIAGNOSIS — Z87891 Personal history of nicotine dependence: Secondary | ICD-10-CM | POA: Insufficient documentation

## 2019-02-13 DIAGNOSIS — I440 Atrioventricular block, first degree: Secondary | ICD-10-CM

## 2019-02-13 DIAGNOSIS — E860 Dehydration: Secondary | ICD-10-CM

## 2019-02-13 DIAGNOSIS — R112 Nausea with vomiting, unspecified: Secondary | ICD-10-CM | POA: Diagnosis not present

## 2019-02-13 DIAGNOSIS — Z7982 Long term (current) use of aspirin: Secondary | ICD-10-CM

## 2019-02-13 DIAGNOSIS — I447 Left bundle-branch block, unspecified: Secondary | ICD-10-CM | POA: Insufficient documentation

## 2019-02-13 DIAGNOSIS — R0602 Shortness of breath: Secondary | ICD-10-CM | POA: Diagnosis not present

## 2019-02-13 DIAGNOSIS — Z8249 Family history of ischemic heart disease and other diseases of the circulatory system: Secondary | ICD-10-CM

## 2019-02-13 DIAGNOSIS — Z79899 Other long term (current) drug therapy: Secondary | ICD-10-CM | POA: Insufficient documentation

## 2019-02-13 DIAGNOSIS — R110 Nausea: Secondary | ICD-10-CM | POA: Diagnosis not present

## 2019-02-13 DIAGNOSIS — I2510 Atherosclerotic heart disease of native coronary artery without angina pectoris: Secondary | ICD-10-CM | POA: Insufficient documentation

## 2019-02-13 DIAGNOSIS — R7989 Other specified abnormal findings of blood chemistry: Secondary | ICD-10-CM | POA: Diagnosis not present

## 2019-02-13 LAB — CBC
HCT: 43.7 % (ref 39.0–52.0)
Hemoglobin: 14.4 g/dL (ref 13.0–17.0)
MCH: 28.3 pg (ref 26.0–34.0)
MCHC: 33 g/dL (ref 30.0–36.0)
MCV: 85.9 fL (ref 80.0–100.0)
Platelets: 255 10*3/uL (ref 150–400)
RBC: 5.09 MIL/uL (ref 4.22–5.81)
RDW: 15.4 % (ref 11.5–15.5)
WBC: 8.7 10*3/uL (ref 4.0–10.5)
nRBC: 0 % (ref 0.0–0.2)

## 2019-02-13 LAB — BASIC METABOLIC PANEL
Anion gap: 12 (ref 5–15)
BUN: 43 mg/dL — ABNORMAL HIGH (ref 6–20)
CO2: 22 mmol/L (ref 22–32)
Calcium: 9.4 mg/dL (ref 8.9–10.3)
Chloride: 102 mmol/L (ref 98–111)
Creatinine, Ser: 2.31 mg/dL — ABNORMAL HIGH (ref 0.61–1.24)
GFR calc non Af Amer: 31 mL/min — ABNORMAL LOW (ref >60)
GFR, EST AFRICAN AMERICAN: 36 mL/min — AB (ref >60)
Glucose, Bld: 155 mg/dL — ABNORMAL HIGH (ref 70–99)
Potassium: 4.2 mmol/L (ref 3.5–5.1)
Sodium: 136 mmol/L (ref 135–145)

## 2019-02-13 LAB — BRAIN NATRIURETIC PEPTIDE: B Natriuretic Peptide: 1041 pg/mL — ABNORMAL HIGH (ref 0.0–100.0)

## 2019-02-13 LAB — TROPONIN I: Troponin I: 0.04 ng/mL (ref <0.03)

## 2019-02-13 MED ORDER — CARVEDILOL 3.125 MG PO TABS
3.1250 mg | ORAL_TABLET | Freq: Two times a day (BID) | ORAL | Status: DC
  Administered 2019-02-13 – 2019-02-14 (×2): 3.125 mg via ORAL
  Filled 2019-02-13 (×2): qty 1

## 2019-02-13 MED ORDER — ALBUTEROL SULFATE (2.5 MG/3ML) 0.083% IN NEBU: 2.5000 mg | INHALATION_SOLUTION | RESPIRATORY_TRACT | Status: DC | PRN

## 2019-02-13 MED ORDER — NITROGLYCERIN 0.4 MG SL SUBL: 0.4000 mg | SUBLINGUAL_TABLET | SUBLINGUAL | Status: DC | PRN

## 2019-02-13 MED ORDER — POLYETHYLENE GLYCOL 3350 17 G PO PACK: 17.0000 g | PACK | Freq: Every day | ORAL | Status: DC | PRN

## 2019-02-13 MED ORDER — HEPARIN SODIUM (PORCINE) 5000 UNIT/ML IJ SOLN
5000.0000 [IU] | Freq: Three times a day (TID) | INTRAMUSCULAR | Status: DC
  Administered 2019-02-13 – 2019-02-14 (×2): 5000 [IU] via SUBCUTANEOUS
  Filled 2019-02-13 (×2): qty 1

## 2019-02-13 MED ORDER — ATORVASTATIN CALCIUM 20 MG PO TABS
80.0000 mg | ORAL_TABLET | Freq: Every day | ORAL | Status: DC
  Administered 2019-02-13: 80 mg via ORAL
  Filled 2019-02-13: qty 4

## 2019-02-13 MED ORDER — ACETAMINOPHEN 650 MG RE SUPP: 650.0000 mg | Freq: Four times a day (QID) | RECTAL | Status: DC | PRN

## 2019-02-13 MED ORDER — CLOPIDOGREL BISULFATE 75 MG PO TABS
75.0000 mg | ORAL_TABLET | Freq: Every day | ORAL | Status: DC
  Administered 2019-02-14: 75 mg via ORAL
  Filled 2019-02-13: qty 1

## 2019-02-13 MED ORDER — ACETAMINOPHEN 325 MG PO TABS: 650.0000 mg | ORAL_TABLET | Freq: Four times a day (QID) | ORAL | Status: DC | PRN

## 2019-02-13 MED ORDER — ONDANSETRON HCL 4 MG PO TABS: 4.0000 mg | ORAL_TABLET | Freq: Four times a day (QID) | ORAL | Status: DC | PRN

## 2019-02-13 MED ORDER — TIOTROPIUM BROMIDE MONOHYDRATE 18 MCG IN CAPS
1.0000 | ORAL_CAPSULE | Freq: Every day | RESPIRATORY_TRACT | Status: DC
  Filled 2019-02-13: qty 5

## 2019-02-13 MED ORDER — CYCLOBENZAPRINE HCL 10 MG PO TABS: 5.0000 mg | ORAL_TABLET | Freq: Three times a day (TID) | ORAL | Status: DC | PRN

## 2019-02-13 MED ORDER — SODIUM CHLORIDE 0.9% FLUSH
3.0000 mL | Freq: Two times a day (BID) | INTRAVENOUS | Status: DC
  Administered 2019-02-14: 3 mL via INTRAVENOUS

## 2019-02-13 MED ORDER — ASPIRIN EC 81 MG PO TBEC
81.0000 mg | DELAYED_RELEASE_TABLET | Freq: Every day | ORAL | Status: DC
  Administered 2019-02-14: 81 mg via ORAL
  Filled 2019-02-13: qty 1

## 2019-02-13 MED ORDER — FLUTICASONE FUROATE-VILANTEROL 200-25 MCG/INH IN AEPB
1.0000 | INHALATION_SPRAY | Freq: Every day | RESPIRATORY_TRACT | Status: DC
  Filled 2019-02-13: qty 28

## 2019-02-13 MED ORDER — AMIODARONE HCL 200 MG PO TABS
200.0000 mg | ORAL_TABLET | Freq: Every day | ORAL | Status: DC
  Administered 2019-02-14: 200 mg via ORAL
  Filled 2019-02-13: qty 1

## 2019-02-13 MED ORDER — SODIUM CHLORIDE 0.9% FLUSH: 3.0000 mL | Freq: Once | INTRAVENOUS | Status: DC

## 2019-02-13 MED ORDER — COLCHICINE 0.6 MG PO TABS: 0.6000 mg | ORAL_TABLET | Freq: Every day | ORAL | Status: DC | PRN

## 2019-02-13 MED ORDER — METHIMAZOLE 10 MG PO TABS
40.0000 mg | ORAL_TABLET | Freq: Every day | ORAL | Status: DC
  Administered 2019-02-14: 40 mg via ORAL
  Filled 2019-02-13: qty 4

## 2019-02-13 MED ORDER — SACUBITRIL-VALSARTAN 49-51 MG PO TABS
1.0000 | ORAL_TABLET | Freq: Two times a day (BID) | ORAL | Status: DC
  Administered 2019-02-13 – 2019-02-14 (×2): 1 via ORAL
  Filled 2019-02-13 (×3): qty 1

## 2019-02-13 MED ORDER — SODIUM CHLORIDE 0.45 % IV SOLN
INTRAVENOUS | Status: DC
  Administered 2019-02-13: 16:00:00 via INTRAVENOUS

## 2019-02-13 MED ORDER — ONDANSETRON HCL 4 MG/2ML IJ SOLN: 4.0000 mg | Freq: Four times a day (QID) | INTRAMUSCULAR | Status: DC | PRN

## 2019-02-13 MED ORDER — SODIUM CHLORIDE 0.9 % IV BOLUS
250.0000 mL | Freq: Once | INTRAVENOUS | Status: AC
  Administered 2019-02-13: 250 mL via INTRAVENOUS

## 2019-02-13 MED ORDER — POTASSIUM CHLORIDE CRYS ER 20 MEQ PO TBCR
20.0000 meq | EXTENDED_RELEASE_TABLET | Freq: Two times a day (BID) | ORAL | Status: DC
  Administered 2019-02-13 – 2019-02-14 (×2): 20 meq via ORAL
  Filled 2019-02-13 (×2): qty 1

## 2019-02-13 NOTE — ED Triage Notes (Signed)
Pt states he was picking up a refill of his medications at the pharmacy today and asked them to check his b/p states he felt llike his heart was beating fast. It was 117, on arrival 111. States he had 2 episodes of vomiting today. Pt has a pacemaker defibrillator for a "weak heart". Denies any SOB or other sx.

## 2019-02-13 NOTE — ED Provider Notes (Signed)
[facility] Emergency Department Provider Note ____________________________________________   First MD Initiated Contact with Patient 02/13/19 1248     (approximate)  I have reviewed the triage vital signs and the nursing notes.   HISTORY  Chief Complaint Tachycardia and Emesis    HPI Stephen [name] is a 52 y.o. male with PMH as noted below who presents with elevated heart rate, noted when he was at the pharmacy, and associated with palpitations.  The patient states that earlier this morning he felt nauseous and had 2 episodes of vomiting.  He states he is no longer nauseous.  He did feel like his heart was racing and he went to the pharmacy.  At that time his heart rate was noted to be 117.  The patient denies any chest pain or shortness of breath.  He does not feel lightheaded or weak.  He has no abdominal pain or diarrhea.  Past Medical History:  Diagnosis Date  . AICD (automatic cardioverter/defibrillator) present   . AKI (acute kidney injury) ([place]) 12/24/2017  . Arrhythmia   . Arthritis    "lower back, knees" (06/10/2018)  . Cardiac arrest ([place]) 12/23/2017   Brief V-fib arrest  . Chest pain 09/08/2017  . CHF (congestive heart failure) (HCC)    nonischemic cardiomyopathy, EF 25%  . Gout    "on daily RX" (06/10/2018)  . Headache    "q couple months" (06/10/2018)  . High cholesterol   . History of kidney stones   . Hypertension   . Influenza A 12/24/2017  . NICM (nonischemic cardiomyopathy) ([place])   . Pneumonia 12/20/2017  . TIA (transient ischemic attack) 06/21/2016   "still affects my memory a little bit" (06/10/2018)    Patient Active Problem List   Diagnosis Date Noted  . AKI (acute kidney injury) ([place]) 12/27/2018  . 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 HTN (hypertension) 11/20/2018  . Hypokalemia 08/19/2018  . Syncope 06/30/2018  . CKD (chronic kidney disease) stage 3, GFR 30-59 ml/min (HCC) 05/13/2018  . Hyperlipidemia  05/13/2018  . ED (erectile dysfunction) 05/13/2018  . Osteoarthritis of right knee 04/30/2018  . Gout 03/26/2018  . VF (ventricular fibrillation) ([place])   . Nonischemic cardiomyopathy ([place])   . Cardiogenic shock ([place]) 12/24/2017  . 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 History of non-ST elevation myocardial infarction (NSTEMI) 09/08/2017  . Bradycardia 08/30/2017  . Ventricular tachycardia ([place]) 07/29/2017  . COPD (chronic obstructive pulmonary disease) ([place]) 07/04/2017  . TIA (transient ischemic attack) 06/22/2016  . Benign hypertensive renal disease 06/22/2016  . Acute on chronic systolic CHF (congestive heart failure) ([place]) 06/04/2016  . Chronic systolic congestive heart failure ([place]) 01/20/2016  . Cardiomyopathy ([place]) 01/20/2016    Past Surgical History:  Procedure Laterality Date  . EXTERNAL FIXATION LEG Right ~ 2000   "was going bowlegged; had to brake my leg to fix it"  . HERNIA REPAIR    . ICD IMPLANT N/A 12/30/2017   Procedure: ICD IMPLANT;  Surgeon: Deboraha Sprang, MD;  Location: [place] [facility];  Service: Cardiovascular;  Laterality: N/A;  . LEFT HEART CATH AND CORONARY ANGIOGRAPHY N/A 09/09/2017   Procedure: LEFT HEART CATH AND CORONARY ANGIOGRAPHY;  Surgeon: Teodoro Spray, MD;  Location: [place] [facility];  Service: Cardiovascular;  Laterality: N/A;  . UMBILICAL HERNIA REPAIR  1990s  . V TACH ABLATION  06/10/2018  . V TACH ABLATION N/A 06/10/2018   Procedure: V TACH ABLATION;  Surgeon: Thompson Grayer, MD;  [facility]  INVASIVE CV LAB;  Service: Cardiovascular;  Laterality: N/A;  . VASECTOMY      Prior to Admission medications   Medication Sig Start Date End Date Taking? Authorizing Provider  acetaminophen (TYLENOL) 325 MG tablet Take 2 tablets (650 mg total) by mouth every 6 (six) hours as needed. Patient taking differently: Take 325-650 mg by mouth every 6 (six) hours as needed for moderate pain.  03/19/18  Yes Carrie Mew, MD  albuterol (PROVENTIL HFA;VENTOLIN  HFA) 108 (90 Base) MCG/ACT inhaler Inhale 2 puffs into the lungs every 4 (four) hours as needed for wheezing or shortness of breath. 09/19/18  Yes Johnson, Megan P, DO  allopurinol (ZYLOPRIM) 100 MG tablet Take 2 tablets (200 mg total) by mouth daily. Patient taking differently: Take 200 mg by mouth daily as needed.  10/19/18  Yes Johnson, Megan P, DO  amiodarone (PACERONE) 200 MG tablet Take 1 tablet (200 mg total) by mouth daily. 01/20/19  Yes Deboraha Sprang, MD  aspirin EC 81 MG EC tablet Take 1 tablet (81 mg total) by mouth daily. 01/01/18  Yes Daune Perch, NP  atorvastatin (LIPITOR) 40 MG tablet Take 80 mg by mouth daily at 6 PM.  10/29/18  Yes [provider]  CAMPHOR-EUCALYPTUS-MENTHOL EX Apply 1 application topically as needed (congestion).   Yes [provider]  carvedilol (COREG) 3.125 MG tablet Take 1 tablet (3.125 mg total) by mouth 2 (two) times daily with a meal. Patient taking differently: Take 6.25 mg by mouth daily.  07/06/18  Yes Wieting, Richard, MD  clopidogrel (PLAVIX) 75 MG tablet TAKE ONE TABLET BY MOUTH EVERY DAY 02/13/19  Yes Bensimhon, Shaune Pascal, MD  colchicine 0.6 MG tablet Take 1 tablet (0.6 mg total) by mouth daily as needed (gout flare). 09/03/18  Yes Johnson, Megan P, DO  cyclobenzaprine (FLEXERIL) 5 MG tablet Take 1 tablet (5 mg total) by mouth 3 (three) times daily as needed (jaw pain). 11/24/18  Yes Nance Pear, MD  diclofenac sodium (VOLTAREN) 1 % GEL Apply 2 g topically 4 (four) times daily as needed (pain).    Yes [provider]  fluticasone (FLONASE) 50 MCG/ACT nasal spray Place 2 sprays into both nostrils daily. Patient taking differently: Place 2 sprays into both nostrils daily as needed for allergies.  06/20/17  Yes [name], MD  fluticasone-salmeterol (ADVAIR HFA) [phone] MCG/ACT inhaler Inhale 2 puffs into the lungs 2 (two) times daily. 08/12/18  Yes Kasa, Maretta Bees, MD  meloxicam (MOBIC) 15 MG tablet Take 15 mg by mouth daily as  needed.    Yes [provider]  Menthol, Topical Analgesic, (ICY HOT EX) Apply 1 application topically daily as needed (pain).   Yes [provider]  methimazole (TAPAZOLE) 10 MG tablet Take 4 tablets (40 mg total) by mouth daily. 12/09/18  Yes Mayo, Pete Pelt, MD  metolazone (ZAROXOLYN) 2.5 MG tablet Take 2.5 mg by mouth as needed.  09/05/18  Yes [provider]  Multiple Vitamin (MULTIVITAMIN WITH MINERALS) TABS tablet Take 1 tablet by mouth daily.   Yes [provider]  nitroGLYCERIN (NITROSTAT) 0.4 MG SL tablet Place 1 tablet (0.4 mg total) under the tongue every 5 (five) minutes as needed for chest pain. 02/19/18  Yes Bensimhon, Shaune Pascal, MD  ondansetron (ZOFRAN ODT) 4 MG disintegrating tablet Take 1 tablet (4 mg total) by mouth every 8 (eight) hours as needed for nausea or vomiting. 01/24/19  Yes Harvest Dark, MD  potassium chloride SA (K-DUR,KLOR-CON) 20 MEQ tablet Take 1  tablet (20 mEq total) by mouth 4 (four) times daily. 01/14/19  Yes Deboraha Sprang, MD  promethazine (PHENERGAN) 12.5 MG tablet Take 1 tablet (12.5 mg total) by mouth every 6 (six) hours as needed for nausea or vomiting. 11/24/18  Yes Nance Pear, MD  sacubitril-valsartan (ENTRESTO) 49-51 MG Take 1 tablet by mouth 2 (two) times daily.   Yes [provider]  Tiotropium Bromide Monohydrate (SPIRIVA RESPIMAT) 2.5 MCG/ACT AERS Inhale 1 puff into the lungs daily. 08/12/18  Yes [name], MD  torsemide (DEMADEX) 20 MG tablet Take 2 tablets (40 mg total) by mouth daily. Patient taking differently: Take 40 mg by mouth 2 (two) times daily.  07/06/18  Yes Loletha Grayer, MD    Allergies Lisinopril  Family History  Problem Relation Age of Onset  . Hypertension Mother   . Heart failure Mother   . Hypertension Father   . CAD Father   . Heart attack Father     Social History Social History   Tobacco Use  . Smoking status: Former Smoker    Packs/day: 0.33    Years: 33.00      Pack years: 10.89    Types: Cigarettes    Last attempt to quit: 02/20/2016    Years since quitting: 2.9  . Smokeless tobacco: Never Used  Substance Use Topics  . Alcohol use: Yes    Alcohol/week: 0.0 standard drinks    Comment: 06/10/2018 "beer once/month; if that"  . Drug use: Never    Review of Systems  Constitutional: No fever. Eyes: No redness. ENT: No sore throat. Cardiovascular: Denies chest pain.  Positive for palpitations. Respiratory: Denies shortness of breath. Gastrointestinal: Positive for resolved vomiting. Genitourinary: Negative for flank pain.  Musculoskeletal: Negative for back pain. Skin: Negative for rash. Neurological: Negative for headache.   ____________________________________________   PHYSICAL EXAM:  VITAL SIGNS: ED Triage Vitals  Enc Vitals Group     BP 02/13/19 1234 (!) 116/100     Pulse Rate 02/13/19 1234 (!) 111     Resp 02/13/19 1234 18     Temp 02/13/19 1234 98 F (36.7 C)     Temp Source 02/13/19 1234 Oral     SpO2 02/13/19 1234 97 %     Weight 02/13/19 1235 231 lb (104.8 kg)     Height 02/13/19 1235 6\' 1"  (1.854 m)     Head Circumference --      Peak Flow --      Pain Score 02/13/19 1235 0     Pain Loc --      Pain Edu? --      Excl. in [place]? --     Constitutional: Alert and oriented.  Relatively well appearing and in no acute distress. Eyes: Conjunctivae are normal.  Head: Atraumatic. Nose: No congestion/rhinnorhea. Mouth/Throat: Mucous membranes are slightly dry.   Neck: Normal range of motion.  Cardiovascular: Borderline tachycardic, regular rhythm. Grossly normal heart sounds.  Good peripheral circulation. Respiratory: Normal respiratory effort.  No retractions. Lungs CTAB. Gastrointestinal: Soft and nontender. No distention.  Genitourinary: No flank tenderness. Musculoskeletal: No lower extremity edema.  Extremities warm and well perfused.  Neurologic:  Normal speech and language. No gross focal neurologic deficits  are appreciated.  Skin:  Skin is warm and dry. No rash noted. Psychiatric: Mood and affect are normal. Speech and behavior are normal.  ____________________________________________   LABS (all labs ordered are listed, but only abnormal results are displayed)  Labs Reviewed  BASIC METABOLIC PANEL - Abnormal;  Notable for the following components:      Result Value   Glucose, Bld 155 (*)    BUN 43 (*)    Creatinine, Ser 2.31 (*)    GFR calc non Af Amer 31 (*)    GFR calc Af Amer 36 (*)    All other components within normal limits  TROPONIN I - Abnormal; Notable for the following components:   Troponin I 0.04 (*)    All other components within normal limits  CBC  BRAIN NATRIURETIC PEPTIDE   ____________________________________________  EKG  ED ECG REPORT I, Arta Silence, the attending physician, personally viewed and interpreted this ECG.  Date: 02/13/2019 EKG Time: 1241 Rate: 105 Rhythm: Sinus tachycardia QRS Axis: Left axis deviation Intervals: LBBB ST/T Wave abnormalities: normal Narrative Interpretation: no evidence of acute ischemia; no significant change when compared to EKG of 01/25/2019  ____________________________________________  RADIOLOGY  CXR: Cardiomegaly and possible early pulmonary edema  ____________________________________________   PROCEDURES  Procedure(s) performed: No  Procedures  Critical Care performed: No ____________________________________________   INITIAL IMPRESSION / ASSESSMENT AND PLAN / ED COURSE  Pertinent labs & imaging results that were available during my care of the patient were reviewed by me and considered in my medical decision making (see chart for details).  53 year old male with PMH as noted above including CHF with AICD, prior history of cardiac arrest, and hypertension who presents with palpitations and elevated heart rate today.  The patient states that earlier this morning he had some nausea and 2 episodes  of vomiting but this has now resolved.  He states that the palpitations are improving.  I reviewed the past medical records in [place].  The patient was seen in the ED last month with nausea, vomiting, and diarrhea and was diagnosed with likely gastroenteritis.  He states that the symptoms that time were more severe.  On exam today he is overall well-appearing.  By the time my exam his vital signs were normal and his tachycardia had mostly resolved.  His abdomen is soft and nontender.  Mucous membranes are slightly dry.  There is no evidence of fluid overload.  Overall I suspect most likely tachycardia due to dehydration/hypovolemia.  Causes of the vomiting include possible gastritis, gastroenteritis, or other benign etiology.  At this time there is no evidence of acute intra-abdominal process or any specific cardiac etiology.  EKG is unchanged from prior.  We will obtain lab work-up, chest x-ray give a small fluid bolus, and reassess.  ----------------------------------------- 2:04 PM on 02/13/2019 -----------------------------------------  The patient's creatinine and BUN are elevated consistent with AKI, possibly from hypovolemia or dehydration.  Given the patient's CHF and appearance on chest x-ray and his multiple comorbidities, he will likely need slow and careful hydration over a longer period.  Therefore, he will be admitted.  I signed him out to the hospitalist Dr. [name]. ____________________________________________   FINAL CLINICAL IMPRESSION(S) / ED DIAGNOSES  Final diagnoses:  Acute kidney injury ([place])  Dehydration      NEW MEDICATIONS STARTED DURING THIS VISIT:  New Prescriptions   No medications on file     Note:  This document was prepared using Dragon voice recognition software and may include unintentional dictation errors.    Arta Silence, MD 02/13/19 [phone]

## 2019-02-13 NOTE — ED Notes (Signed)
ED Provider at bedside. 

## 2019-02-13 NOTE — Plan of Care (Signed)
  Problem: Clinical Measurements: Goal: Cardiovascular complication will be avoided Outcome: Progressing   Problem: Safety: Goal: Ability to remain free from injury will improve Outcome: Progressing   

## 2019-02-13 NOTE — Telephone Encounter (Signed)
Pt needs follow up for future refills.

## 2019-02-13 NOTE — H&P (Signed)
[facility] NAME: Stephen [name]    MR#:  [id number]  DATE OF BIRTH:  1966/10/08  DATE OF ADMISSION:  02/13/2019  PRIMARY CARE PHYSICIAN: Valerie Roys, DO   REQUESTING/REFERRING PHYSICIAN: Dr. Cherylann Banas  CHIEF COMPLAINT:   Chief Complaint  Patient presents with  . Tachycardia  . Emesis    HISTORY OF PRESENT ILLNESS:  Stephen [name]  is a 53 y.o. male with a known history of nonischemic cardiomyopathy with ejection fraction 10 to 15%, AICD, CKD stage III, hypertension presents to the emergency room due to having tachycardia when his blood pressure and heart rate were checked at his pharmacy.  Patient felt okay.  At this point he has headache.  Had palpitations earlier.  Here in the emergency room his EKG looks unchanged.  He does get tachycardic into the 130s at time.  Found to have worsening renal function with creatinine at 2.3.  Baseline seems to be 1.5-1.8.  Chest x-ray shows mild pulmonary edema. No recent change in medications.  He had one episode of vomiting earlier.  PAST MEDICAL HISTORY:   Past Medical History:  Diagnosis Date  . AICD (automatic cardioverter/defibrillator) present   . AKI (acute kidney injury) ([facility]) 12/24/2017  . Arrhythmia   . Arthritis    "lower back, knees" (06/10/2018)  . Cardiac arrest ([facility]) 12/23/2017   Brief V-fib arrest  . Chest pain 09/08/2017  . CHF (congestive heart failure) ([facility])    nonischemic cardiomyopathy, EF 25%  . Gout    "on daily RX" (06/10/2018)  . Headache    "q couple months" (06/10/2018)  . High cholesterol   . History of kidney stones   . Hypertension   . Influenza A 12/24/2017  . NICM (nonischemic cardiomyopathy) ([facility])   . Pneumonia 12/20/2017  . TIA (transient ischemic attack) 06/21/2016   "still affects my memory a little bit" (06/10/2018)    PAST SURGICAL HISTORY:   Past Surgical History:  Procedure Laterality Date  . EXTERNAL FIXATION LEG Right ~ 2000   "was  going bowlegged; had to brake my leg to fix it"  . HERNIA REPAIR    . ICD IMPLANT N/A 12/30/2017   Procedure: ICD IMPLANT;  Surgeon: Deboraha Sprang, MD;  Location: [facility];  Service: Cardiovascular;  Laterality: N/A;  . LEFT HEART CATH AND CORONARY ANGIOGRAPHY N/A 09/09/2017   Procedure: LEFT HEART CATH AND CORONARY ANGIOGRAPHY;  Surgeon: Teodoro Spray, MD;  Location: [facility];  Service: Cardiovascular;  Laterality: N/A;  . UMBILICAL HERNIA REPAIR  1990s  . V TACH ABLATION  06/10/2018  . V TACH ABLATION N/A 06/10/2018   Procedure: V TACH ABLATION;  Surgeon: Thompson Grayer, MD;  Location: [facility];  Service: Cardiovascular;  Laterality: N/A;  . VASECTOMY      SOCIAL HISTORY:   Social History   Tobacco Use  . Smoking status: Former Smoker    Packs/day: 0.33    Years: 33.00    Pack years: 10.89    Types: Cigarettes    Last attempt to quit: 02/20/2016    Years since quitting: 2.9  . Smokeless tobacco: Never Used  Substance Use Topics  . Alcohol use: Yes    Alcohol/week: 0.0 standard drinks    Comment: 06/10/2018 "beer once/month; if that"    FAMILY HISTORY:   Family History  Problem Relation Age of Onset  . Hypertension Mother   . Heart failure Mother   .  Hypertension Father   . CAD Father   . Heart attack Father     DRUG ALLERGIES:   Allergies  Allergen Reactions  . Lisinopril Cough    REVIEW OF SYSTEMS:   Review of Systems  Constitutional: Positive for malaise/fatigue. Negative for chills, fever and weight loss.  HENT: Negative for hearing loss and nosebleeds.   Eyes: Negative for blurred vision, double vision and pain.  Respiratory: Negative for cough, hemoptysis, sputum production, shortness of breath and wheezing.   Cardiovascular: Negative for chest pain, palpitations, orthopnea and leg swelling.  Gastrointestinal: Negative for abdominal pain, constipation, diarrhea, nausea and vomiting.  Genitourinary: Negative for dysuria  and hematuria.  Musculoskeletal: Negative for back pain, falls and myalgias.  Skin: Negative for rash.  Neurological: Negative for dizziness, tremors, sensory change, speech change, focal weakness, seizures and headaches.  Endo/Heme/Allergies: Does not bruise/bleed easily.  Psychiatric/Behavioral: Negative for depression and memory loss. The patient is not nervous/anxious.     MEDICATIONS AT HOME:   Prior to Admission medications   Medication Sig Start Date End Date Taking? Authorizing Provider  acetaminophen (TYLENOL) 325 MG tablet Take 2 tablets (650 mg total) by mouth every 6 (six) hours as needed. Patient taking differently: Take 325-650 mg by mouth every 6 (six) hours as needed for moderate pain.  03/19/18  Yes Carrie Mew, MD  albuterol (PROVENTIL HFA;VENTOLIN HFA) 108 (90 Base) MCG/ACT inhaler Inhale 2 puffs into the lungs every 4 (four) hours as needed for wheezing or shortness of breath. 09/19/18  Yes Johnson, Megan P, DO  allopurinol (ZYLOPRIM) 100 MG tablet Take 2 tablets (200 mg total) by mouth daily. Patient taking differently: Take 200 mg by mouth daily as needed.  10/19/18  Yes Johnson, Megan P, DO  amiodarone (PACERONE) 200 MG tablet Take 1 tablet (200 mg total) by mouth daily. 01/20/19  Yes Deboraha Sprang, MD  aspirin EC 81 MG EC tablet Take 1 tablet (81 mg total) by mouth daily. 01/01/18  Yes Daune Perch, NP  atorvastatin (LIPITOR) 40 MG tablet Take 80 mg by mouth daily at 6 PM.  10/29/18  Yes [provider]  CAMPHOR-EUCALYPTUS-MENTHOL EX Apply 1 application topically as needed (congestion).   Yes [provider]  carvedilol (COREG) 3.125 MG tablet Take 1 tablet (3.125 mg total) by mouth 2 (two) times daily with a meal. Patient taking differently: Take 6.25 mg by mouth daily.  07/06/18  Yes Wieting, Richard, MD  clopidogrel (PLAVIX) 75 MG tablet TAKE ONE TABLET BY MOUTH EVERY DAY 02/13/19  Yes Bensimhon, Shaune Pascal, MD  colchicine 0.6 MG tablet Take 1  tablet (0.6 mg total) by mouth daily as needed (gout flare). 09/03/18  Yes Johnson, Megan P, DO  cyclobenzaprine (FLEXERIL) 5 MG tablet Take 1 tablet (5 mg total) by mouth 3 (three) times daily as needed (jaw pain). 11/24/18  Yes Nance Pear, MD  diclofenac sodium (VOLTAREN) 1 % GEL Apply 2 g topically 4 (four) times daily as needed (pain).    Yes [provider]  fluticasone (FLONASE) 50 MCG/ACT nasal spray Place 2 sprays into both nostrils daily. Patient taking differently: Place 2 sprays into both nostrils daily as needed for allergies.  06/20/17  Yes [name], MD  fluticasone-salmeterol (ADVAIR HFA) [phone] MCG/ACT inhaler Inhale 2 puffs into the lungs 2 (two) times daily. 08/12/18  Yes Kasa, Maretta Bees, MD  meloxicam (MOBIC) 15 MG tablet Take 15 mg by mouth daily as needed.    Yes [provider]  Menthol, Topical  Analgesic, (ICY HOT EX) Apply 1 application topically daily as needed (pain).   Yes [provider]  methimazole (TAPAZOLE) 10 MG tablet Take 4 tablets (40 mg total) by mouth daily. 12/09/18  Yes Mayo, Pete Pelt, MD  metolazone (ZAROXOLYN) 2.5 MG tablet Take 2.5 mg by mouth as needed.  09/05/18  Yes [provider]  Multiple Vitamin (MULTIVITAMIN WITH MINERALS) TABS tablet Take 1 tablet by mouth daily.   Yes [provider]  nitroGLYCERIN (NITROSTAT) 0.4 MG SL tablet Place 1 tablet (0.4 mg total) under the tongue every 5 (five) minutes as needed for chest pain. 02/19/18  Yes Bensimhon, Shaune Pascal, MD  ondansetron (ZOFRAN ODT) 4 MG disintegrating tablet Take 1 tablet (4 mg total) by mouth every 8 (eight) hours as needed for nausea or vomiting. 01/24/19  Yes Harvest Dark, MD  potassium chloride SA (K-DUR,KLOR-CON) 20 MEQ tablet Take 1 tablet (20 mEq total) by mouth 4 (four) times daily. 01/14/19  Yes Deboraha Sprang, MD  promethazine (PHENERGAN) 12.5 MG tablet Take 1 tablet (12.5 mg total) by mouth every 6 (six) hours as needed for nausea or  vomiting. 11/24/18  Yes Nance Pear, MD  sacubitril-valsartan (ENTRESTO) 49-51 MG Take 1 tablet by mouth 2 (two) times daily.   Yes [provider]  Tiotropium Bromide Monohydrate (SPIRIVA RESPIMAT) 2.5 MCG/ACT AERS Inhale 1 puff into the lungs daily. 08/12/18  Yes [name], MD  torsemide (DEMADEX) 20 MG tablet Take 2 tablets (40 mg total) by mouth daily. Patient taking differently: Take 40 mg by mouth 2 (two) times daily.  07/06/18  Yes Wieting, Richard, MD     VITAL SIGNS:  Blood pressure 108/69, pulse 79, temperature 98 F (36.7 C), temperature source Oral, resp. rate 14, height 6\' 1"  (1.854 m), weight 104.8 kg, SpO2 98 %.  PHYSICAL EXAMINATION:  Physical Exam  GENERAL:  53 y.o.-year-old patient lying in the bed with no acute distress.  EYES: Pupils equal, round, reactive to light and accommodation. No scleral icterus. Extraocular muscles intact.  HEENT: Head atraumatic, normocephalic. Oropharynx and nasopharynx clear. No oropharyngeal erythema, moist oral mucosa  NECK:  Supple, no jugular venous distention. No thyroid enlargement, no tenderness.  LUNGS: Normal breath sounds bilaterally, no wheezing, rales, rhonchi. No use of accessory muscles of respiration.  CARDIOVASCULAR: S1, S2 normal. No murmurs, rubs, or gallops.  ABDOMEN: Soft, nontender, nondistended. Bowel sounds present. No organomegaly or mass.  EXTREMITIES: No pedal edema, cyanosis, or clubbing. + 2 pedal & radial pulses b/l.   NEUROLOGIC: Cranial nerves II through XII are intact. No focal Motor or sensory deficits appreciated b/l PSYCHIATRIC: The patient is alert and oriented x 3. Good affect.  SKIN: No obvious rash, lesion, or ulcer.   LABORATORY PANEL:   CBC Recent Labs  Lab 02/13/19 1245  WBC 8.7  HGB 14.4  HCT 43.7  PLT 255   ------------------------------------------------------------------------------------------------------------------  Chemistries  Recent Labs  Lab 02/13/19 1245  NA  136  K 4.2  CL 102  CO2 22  GLUCOSE 155*  BUN 43*  CREATININE 2.31*  CALCIUM 9.4   ------------------------------------------------------------------------------------------------------------------  Cardiac Enzymes Recent Labs  Lab 02/13/19 1245  TROPONINI 0.04*   ------------------------------------------------------------------------------------------------------------------  RADIOLOGY:  Dg Chest 2 View  Result Date: 02/13/2019 CLINICAL DATA:  Palpitations beginning today. EXAM: CHEST - 2 VIEW COMPARISON:  12/27/2018 FINDINGS: Chronic cardiomegaly. AICD in place. 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 No measurable effusion. No acute bone finding. IMPRESSION: Cardiomegaly, pulmonary venous hypertension and early pulmonary  edema. Electronically Signed   By: Nelson Chimes M.D.   On: 02/13/2019 13:35   IMPRESSION AND PLAN:   *Acute kidney injury over CKD stage III.  Likely due to diuretics.  Hold diuretics.  Start IV fluids.  Chest x-ray shows mild pulmonary edema but no crackles on examination and no lower extremity edema.  Patient has no shortness of breath.  Monitor closely.  Monitor input and output.  Repeat BMP in the morning.  *Chronic systolic CHF ejection fraction 10 to 15%.  Continue patient's Coreg and Entresto.  Diuretics held at this point.  *Nonischemic cardiomyopathy with AICD in place.  Tachycardia.  Patient is on amiodarone and Coreg.  Unable to increase Coreg dose due to low normal blood pressure.  Will consult cardiology for further input.  Patient sees Dr. Towanda Malkin.  *DVT prophylaxis with heparin  All the records are reviewed and case discussed with ED provider. Management plans discussed with the patient, family and they are in agreement.  CODE STATUS: Full code  TOTAL TIME TAKING CARE OF THIS PATIENT: 40 minutes.   Leia Alf Stephen [name] M.D on 02/13/2019 at 2:53 PM  Between 7am to 6pm - Pager - [phone]  After 6pm go to  [email or web address] - password [email or web address] [facility]  Office  [phone]  CC: Primary care physician; Valerie Roys, DO  Note: This dictation was prepared with Dragon dictation along with smaller phrase technology. Any transcriptional errors that result from this process are unintentional.

## 2019-02-13 NOTE — ED Notes (Signed)
Lab verified that BNP can be ran on blood sent earlier.

## 2019-02-13 NOTE — ED Notes (Signed)
Pt reports he went to the Drug store and checked his BP and pulse, BP 119/99 HR 117. Pt reports he feels okay. Pt in NAD at desk.

## 2019-02-13 NOTE — ED Notes (Signed)
Patient transported to X-ray 

## 2019-02-14 DIAGNOSIS — N179 Acute kidney failure, unspecified: Secondary | ICD-10-CM | POA: Diagnosis not present

## 2019-02-14 DIAGNOSIS — N183 Chronic kidney disease, stage 3 (moderate): Secondary | ICD-10-CM | POA: Diagnosis not present

## 2019-02-14 DIAGNOSIS — I5022 Chronic systolic (congestive) heart failure: Secondary | ICD-10-CM | POA: Diagnosis not present

## 2019-02-14 DIAGNOSIS — R7989 Other specified abnormal findings of blood chemistry: Secondary | ICD-10-CM | POA: Diagnosis not present

## 2019-02-14 DIAGNOSIS — R000 Tachycardia, unspecified: Secondary | ICD-10-CM | POA: Diagnosis not present

## 2019-02-14 LAB — BASIC METABOLIC PANEL
Anion gap: 6 (ref 5–15)
BUN: 41 mg/dL — ABNORMAL HIGH (ref 6–20)
CALCIUM: 9 mg/dL (ref 8.9–10.3)
CO2: 26 mmol/L (ref 22–32)
Chloride: 106 mmol/L (ref 98–111)
Creatinine, Ser: 2.02 mg/dL — ABNORMAL HIGH (ref 0.61–1.24)
GFR calc non Af Amer: 37 mL/min — ABNORMAL LOW (ref >60)
GFR, EST AFRICAN AMERICAN: 42 mL/min — AB (ref >60)
Glucose, Bld: 100 mg/dL — ABNORMAL HIGH (ref 70–99)
Potassium: 3.9 mmol/L (ref 3.5–5.1)
Sodium: 138 mmol/L (ref 135–145)

## 2019-02-14 LAB — CBC
HCT: 38.9 % — ABNORMAL LOW (ref 39.0–52.0)
Hemoglobin: 12.4 g/dL — ABNORMAL LOW (ref 13.0–17.0)
MCH: 28.2 pg (ref 26.0–34.0)
MCHC: 31.9 g/dL (ref 30.0–36.0)
MCV: 88.4 fL (ref 80.0–100.0)
PLATELETS: 215 10*3/uL (ref 150–400)
RBC: 4.4 MIL/uL (ref 4.22–5.81)
RDW: 15.7 % — ABNORMAL HIGH (ref 11.5–15.5)
WBC: 8.4 10*3/uL (ref 4.0–10.5)
nRBC: 0 % (ref 0.0–0.2)

## 2019-02-14 MED ORDER — MENTHOL 3 MG MT LOZG
1.0000 | LOZENGE | OROMUCOSAL | Status: DC | PRN
  Administered 2019-02-14: 3 mg via ORAL
  Filled 2019-02-14: qty 9

## 2019-02-14 MED ORDER — NICOTINE 14 MG/24HR TD PT24: 14.0000 mg | MEDICATED_PATCH | Freq: Every day | TRANSDERMAL | Status: DC

## 2019-02-14 NOTE — Consult Note (Signed)
[facility] Consultation Note  Patient ID: Stephen [name], MRN: [id number], DOB/AGE: Jun 14, 1966 53 y.o. Admit date: 02/13/2019   Date of Consult: 02/14/2019 Primary Physician: Valerie Roys, DO Primary Cardiologist: [facility]  Chief Complaint:  Chief Complaint  Patient presents with  . Tachycardia  . Emesis   Reason for Consult: Tachycardia heart failure  HPI: 53 y.o. male with known cardiovascular disease with severe chronic systolic dysfunction congestive heart failure with ejection fraction of 15% status post ICD placement with essential hypertension mixed hyperlipidemia.  The patient additionally has had chronic kidney disease which is slightly worse at this time.  Yesterday when he was at the pharmacist he said that his blood pressure felt like it was up and he had his blood pressure and heart rate checked.  Heart rate was slightly elevated but did not have any significant symptoms at that time.  When seen in the emergency room he had some sinus tachycardia with left bundle branch block as well as a troponin level of 0.04 consistent with demand ischemia and a chest x-ray with mild pulmonary edema but no current evidence of exam findings of pulmonary edema or rails.  The patient has had significant improvements of his symptoms overnight with appropriate medication management as before.  The patient feels well with no further evidence of heart failure or angina or myocardial infarction or acute coronary syndrome.  Past Medical History:  Diagnosis Date  . AICD (automatic cardioverter/defibrillator) present   . AKI (acute kidney injury) ([place]) 12/24/2017  . Arrhythmia   . Arthritis    "lower back, knees" (06/10/2018)  . Cardiac arrest ([place]) 12/23/2017   Brief V-fib arrest  . Chest pain 09/08/2017  . CHF (congestive heart failure) ([facility])    nonischemic cardiomyopathy, EF 25%  . Gout    "on daily RX" (06/10/2018)  . Headache    "q couple months" (06/10/2018)  . High  cholesterol   . History of kidney stones   . Hypertension   . Influenza A 12/24/2017  . NICM (nonischemic cardiomyopathy) ([place])   . Pneumonia 12/20/2017  . TIA (transient ischemic attack) 06/21/2016   "still affects my memory a little bit" (06/10/2018)      Surgical History:  Past Surgical History:  Procedure Laterality Date  . EXTERNAL FIXATION LEG Right ~ 2000   "was going bowlegged; had to brake my leg to fix it"  . HERNIA REPAIR    . ICD IMPLANT N/A 12/30/2017   Procedure: ICD IMPLANT;  Surgeon: Deboraha Sprang, MD;  Location: [facility];  Service: Cardiovascular;  Laterality: N/A;  . LEFT HEART CATH AND CORONARY ANGIOGRAPHY N/A 09/09/2017   Procedure: LEFT HEART CATH AND CORONARY ANGIOGRAPHY;  Surgeon: Teodoro Spray, MD;  Location: [facility];  Service: Cardiovascular;  Laterality: N/A;  . UMBILICAL HERNIA REPAIR  1990s  . V TACH ABLATION  06/10/2018  . V TACH ABLATION N/A 06/10/2018   Procedure: V TACH ABLATION;  Surgeon: Thompson Grayer, MD;  Location: [facility];  Service: Cardiovascular;  Laterality: N/A;  . VASECTOMY       Home Meds: Prior to Admission medications   Medication Sig Start Date End Date Taking? Authorizing Provider  acetaminophen (TYLENOL) 325 MG tablet Take 2 tablets (650 mg total) by mouth every 6 (six) hours as needed. Patient taking differently: Take 325-650 mg by mouth every 6 (six) hours as needed for moderate pain.  03/19/18  Yes Carrie Mew, MD  albuterol (PROVENTIL  HFA;VENTOLIN HFA) 108 (90 Base) MCG/ACT inhaler Inhale 2 puffs into the lungs every 4 (four) hours as needed for wheezing or shortness of breath. 09/19/18  Yes Johnson, Megan P, DO  allopurinol (ZYLOPRIM) 100 MG tablet Take 2 tablets (200 mg total) by mouth daily. Patient taking differently: Take 200 mg by mouth daily as needed.  10/19/18  Yes Johnson, Megan P, DO  amiodarone (PACERONE) 200 MG tablet Take 1 tablet (200 mg total) by mouth daily. 01/20/19  Yes  Deboraha Sprang, MD  aspirin EC 81 MG EC tablet Take 1 tablet (81 mg total) by mouth daily. 01/01/18  Yes Daune Perch, NP  atorvastatin (LIPITOR) 40 MG tablet Take 80 mg by mouth daily at 6 PM.  10/29/18  Yes [provider]  CAMPHOR-EUCALYPTUS-MENTHOL EX Apply 1 application topically as needed (congestion).   Yes [provider]  carvedilol (COREG) 3.125 MG tablet Take 1 tablet (3.125 mg total) by mouth 2 (two) times daily with a meal. Patient taking differently: Take 6.25 mg by mouth daily.  07/06/18  Yes Wieting, Richard, MD  clopidogrel (PLAVIX) 75 MG tablet TAKE ONE TABLET BY MOUTH EVERY DAY 02/13/19  Yes Bensimhon, Shaune Pascal, MD  colchicine 0.6 MG tablet Take 1 tablet (0.6 mg total) by mouth daily as needed (gout flare). 09/03/18  Yes Johnson, Megan P, DO  cyclobenzaprine (FLEXERIL) 5 MG tablet Take 1 tablet (5 mg total) by mouth 3 (three) times daily as needed (jaw pain). 11/24/18  Yes Nance Pear, MD  diclofenac sodium (VOLTAREN) 1 % GEL Apply 2 g topically 4 (four) times daily as needed (pain).    Yes [provider]  fluticasone (FLONASE) 50 MCG/ACT nasal spray Place 2 sprays into both nostrils daily. Patient taking differently: Place 2 sprays into both nostrils daily as needed for allergies.  06/20/17  Yes [name], MD  fluticasone-salmeterol (ADVAIR HFA) [phone] MCG/ACT inhaler Inhale 2 puffs into the lungs 2 (two) times daily. 08/12/18  Yes Kasa, Maretta Bees, MD  meloxicam (MOBIC) 15 MG tablet Take 15 mg by mouth daily as needed.    Yes [provider]  Menthol, Topical Analgesic, (ICY HOT EX) Apply 1 application topically daily as needed (pain).   Yes [provider]  methimazole (TAPAZOLE) 10 MG tablet Take 4 tablets (40 mg total) by mouth daily. 12/09/18  Yes Mayo, Pete Pelt, MD  metolazone (ZAROXOLYN) 2.5 MG tablet Take 2.5 mg by mouth as needed.  09/05/18  Yes [provider]  Multiple Vitamin (MULTIVITAMIN WITH MINERALS) TABS  tablet Take 1 tablet by mouth daily.   Yes [provider]  nitroGLYCERIN (NITROSTAT) 0.4 MG SL tablet Place 1 tablet (0.4 mg total) under the tongue every 5 (five) minutes as needed for chest pain. 02/19/18  Yes Bensimhon, Shaune Pascal, MD  ondansetron (ZOFRAN ODT) 4 MG disintegrating tablet Take 1 tablet (4 mg total) by mouth every 8 (eight) hours as needed for nausea or vomiting. 01/24/19  Yes Harvest Dark, MD  potassium chloride SA (K-DUR,KLOR-CON) 20 MEQ tablet Take 1 tablet (20 mEq total) by mouth 4 (four) times daily. 01/14/19  Yes Deboraha Sprang, MD  promethazine (PHENERGAN) 12.5 MG tablet Take 1 tablet (12.5 mg total) by mouth every 6 (six) hours as needed for nausea or vomiting. 11/24/18  Yes Nance Pear, MD  sacubitril-valsartan (ENTRESTO) 49-51 MG Take 1 tablet by mouth 2 (two) times daily.   Yes [provider]  Tiotropium Bromide Monohydrate (SPIRIVA RESPIMAT) 2.5 MCG/ACT AERS Inhale 1 puff  into the lungs daily. 08/12/18  Yes [name], MD  torsemide (DEMADEX) 20 MG tablet Take 2 tablets (40 mg total) by mouth daily. Patient taking differently: Take 40 mg by mouth 2 (two) times daily.  07/06/18  Yes Loletha Grayer, MD    Inpatient Medications:  . amiodarone  200 mg Oral Daily  . aspirin EC  81 mg Oral Daily  . atorvastatin  80 mg Oral q1800  . carvedilol  3.125 mg Oral BID WC  . clopidogrel  75 mg Oral Daily  . fluticasone furoate-vilanterol  1 puff Inhalation Daily  . heparin  5,000 Units Subcutaneous Q8H  . methimazole  40 mg Oral Daily  . potassium chloride SA  20 mEq Oral BID  . sacubitril-valsartan  1 tablet Oral BID  . sodium chloride flush  3 mL Intravenous Once  . sodium chloride flush  3 mL Intravenous Q12H  . tiotropium  1 capsule Inhalation Daily     Allergies:  Allergies  Allergen Reactions  . Lisinopril Cough    Social History   Socioeconomic History  . Marital status: Single    Spouse name: Not on file  . Number of children:  Not on file  . Years of education: Not on file  . Highest education level: Not on file  Occupational History  . Not on file  Social Needs  . Financial resource strain: Not on file  . Food insecurity:    Worry: Not on file    Inability: Not on file  . Transportation needs:    Medical: Not on file    Non-medical: Not on file  Tobacco Use  . Smoking status: Former Smoker    Packs/day: 0.33    Years: 33.00    Pack years: 10.89    Types: Cigarettes    Last attempt to quit: 02/20/2016    Years since quitting: 2.9  . Smokeless tobacco: Never Used  Substance and Sexual Activity  . Alcohol use: Yes    Alcohol/week: 0.0 standard drinks    Comment: 06/10/2018 "beer once/month; if that"  . Drug use: Never  . Sexual activity: Yes  Lifestyle  . Physical activity:    Days per week: Not on file    Minutes per session: Not on file  . Stress: Not on file  Relationships  . Social connections:    Talks on phone: Not on file    Gets together: Not on file    Attends religious service: Not on file    Active member of club or organization: Not on file    Attends meetings of clubs or organizations: Not on file    Relationship status: Not on file  . Intimate partner violence:    Fear of current or ex partner: Not on file    Emotionally abused: Not on file    Physically abused: Not on file    Forced sexual activity: Not on file  Other Topics Concern  . Not on file  Social History Narrative   Independent at baseline, ambulates steadily     Family History  Problem Relation Age of Onset  . Hypertension Mother   . Heart failure Mother   . Hypertension Father   . CAD Father   . Heart attack Father      Review of Systems Positive for palpitation tachycardia Negative for: General:  chills, fever, night sweats or weight changes.  Cardiovascular: PND orthopnea syncope dizziness  Dermatological skin lesions rashes Respiratory: Cough congestion Urologic: Frequent urination urination  at  night and hematuria Abdominal: negative for nausea, vomiting, diarrhea, bright red blood per rectum, melena, or hematemesis Neurologic: negative for visual changes, and/or hearing changes  All other systems reviewed and are otherwise negative except as noted above.  Labs: Recent Labs    02/13/19 1245  TROPONINI 0.04*   Lab Results  Component Value Date   WBC 8.4 02/14/2019   HGB 12.4 (L) 02/14/2019   HCT 38.9 (L) 02/14/2019   MCV 88.4 02/14/2019   PLT 215 02/14/2019    Recent Labs  Lab 02/14/19 0353  NA 138  K 3.9  CL 106  CO2 26  BUN 41*  CREATININE 2.02*  CALCIUM 9.0  GLUCOSE 100*   Lab Results  Component Value Date   CHOL 217 (H) 12/03/2018   HDL 47 12/03/2018   LDLCALC 134 (H) 12/03/2018   TRIG 179 (H) 12/03/2018   Lab Results  Component Value Date   DDIMER 1.13 (H) 09/27/2017    Radiology/Studies:  Dg Chest 2 View  Result Date: 02/13/2019 CLINICAL DATA:  Palpitations beginning today. EXAM: CHEST - 2 VIEW COMPARISON:  12/27/2018 FINDINGS: Chronic cardiomegaly. AICD in place. 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 No measurable effusion. No acute bone finding. IMPRESSION: Cardiomegaly, pulmonary venous hypertension and early pulmonary edema. Electronically Signed   By: Nelson Chimes M.D.   On: 02/13/2019 13:35    EKG: Sinus tachycardia with left bundle branch block  Weights: Filed Weights   02/13/19 1235 02/13/19 1545 02/14/19 0542  Weight: 104.8 kg 103.1 kg 103.7 kg     Physical Exam: Blood pressure (!) 92/59, pulse 66, temperature 98 F (36.7 C), temperature source Oral, resp. rate 20, height 6\' 1"  (1.854 m), weight 103.7 kg, SpO2 97 %. Body mass index is 30.17 kg/m. General: Well developed, well nourished, in no acute distress. Head eyes ears nose throat: Normocephalic, atraumatic, sclera non-icteric, no xanthomas, nares are without discharge. No apparent thyromegaly and/or mass  Lungs: Normal respiratory effort.  no wheezes,  no rales, no rhonchi.  Heart: RRR with normal S1 S2. no murmur gallop, no rub, PMI is normal size and placement, carotid upstroke normal without bruit, jugular venous pressure is normal Abdomen: Soft, non-tender, non-distended with normoactive bowel sounds. No hepatomegaly. No rebound/guarding. No obvious abdominal masses. Abdominal aorta is normal size without bruit Extremities: Trace edema. no cyanosis, no clubbing, no ulcers  Peripheral : 2+ bilateral upper extremity pulses, 2+ bilateral femoral pulses, 2+ bilateral dorsal pedal pulse Neuro: Alert and oriented. No facial asymmetry. No focal deficit. Moves all extremities spontaneously. Musculoskeletal: Normal muscle tone without kyphosis Psych:  Responds to questions appropriately with a normal affect.    Assessment: 53 year old male with dilated cardiomyopathy with acute on chronic systolic dysfunction congestive heart failure and mild sinus tachycardia now improved with appropriate medication management with elevated troponin consistent with demand ischemia and no current evidence of acute coronary syndrome or anginal symptoms  Plan: 1.  Continue diuresis for further treatment of acute on chronic systolic dysfunction heart failure and follow closely for chronic kidney disease with concerns of nephrology consultation for further long-term outpatient evaluation if needed 2.  No changes in amiodarone for risk reduction in ventricular tachycardia or atrial fibrillation along with carvedilol and Entresto.  Although would consider following closely if Delene Loll is causing more kidney dysfunction 3.  High intensity cholesterol therapy 4.  No further cardiac diagnostics necessary at this time 5.  If ambulating well with no further significant symptoms okay for discharge home  from the cardiac standpoint with follow-up next week for further adjustments of medication management  Signed, Corey Skains M.D. [facility] 02/14/2019,  10:00 AM

## 2019-02-14 NOTE — Progress Notes (Signed)
RN removed IV.  Patient will depart in a private vehicle.  Phillis Knack, RN

## 2019-02-14 NOTE — Discharge Instructions (Signed)
Resume diet and activity as before  Check weight everyday. Keep log and take to your doctors appointment

## 2019-02-14 NOTE — Plan of Care (Signed)

## 2019-02-16 ENCOUNTER — Telehealth: Payer: Self-pay

## 2019-02-16 ENCOUNTER — Emergency Department: Payer: Commercial Managed Care - PPO

## 2019-02-16 ENCOUNTER — Other Ambulatory Visit: Payer: Self-pay

## 2019-02-16 ENCOUNTER — Emergency Department
Admission: EM | Admit: 2019-02-16 | Discharge: 2019-02-16 | Disposition: A | Discharge status: Home / Self Care | Payer: Commercial Managed Care - PPO | Source: Home / Self Care | Attending: Emergency Medicine | Admitting: Emergency Medicine

## 2019-02-16 ENCOUNTER — Encounter: Payer: Self-pay | Admitting: Emergency Medicine

## 2019-02-16 DIAGNOSIS — Z87891 Personal history of nicotine dependence: Secondary | ICD-10-CM

## 2019-02-16 DIAGNOSIS — Z79899 Other long term (current) drug therapy: Secondary | ICD-10-CM

## 2019-02-16 DIAGNOSIS — R0602 Shortness of breath: Secondary | ICD-10-CM | POA: Insufficient documentation

## 2019-02-16 DIAGNOSIS — R110 Nausea: Secondary | ICD-10-CM

## 2019-02-16 DIAGNOSIS — I509 Heart failure, unspecified: Secondary | ICD-10-CM | POA: Insufficient documentation

## 2019-02-16 DIAGNOSIS — I110 Hypertensive heart disease with heart failure: Secondary | ICD-10-CM

## 2019-02-16 LAB — CBC
HCT: 42.6 % (ref 39.0–52.0)
HEMOGLOBIN: 13.9 g/dL (ref 13.0–17.0)
MCH: 28.4 pg (ref 26.0–34.0)
MCHC: 32.6 g/dL (ref 30.0–36.0)
MCV: 86.9 fL (ref 80.0–100.0)
Platelets: 217 10*3/uL (ref 150–400)
RBC: 4.9 MIL/uL (ref 4.22–5.81)
RDW: 15.9 % — ABNORMAL HIGH (ref 11.5–15.5)
WBC: 10.4 10*3/uL (ref 4.0–10.5)
nRBC: 0 % (ref 0.0–0.2)

## 2019-02-16 LAB — COMPREHENSIVE METABOLIC PANEL
ALK PHOS: 82 U/L (ref 38–126)
ALT: 14 U/L (ref 0–44)
ANION GAP: 10 (ref 5–15)
AST: 16 U/L (ref 15–41)
Albumin: 4.1 g/dL (ref 3.5–5.0)
BUN: 29 mg/dL — ABNORMAL HIGH (ref 6–20)
CO2: 21 mmol/L — ABNORMAL LOW (ref 22–32)
Calcium: 9.7 mg/dL (ref 8.9–10.3)
Chloride: 108 mmol/L (ref 98–111)
Creatinine, Ser: 1.61 mg/dL — ABNORMAL HIGH (ref 0.61–1.24)
GFR calc Af Amer: 56 mL/min — ABNORMAL LOW (ref >60)
GFR calc non Af Amer: 48 mL/min — ABNORMAL LOW (ref >60)
Glucose, Bld: 131 mg/dL — ABNORMAL HIGH (ref 70–99)
Potassium: 4.7 mmol/L (ref 3.5–5.1)
Sodium: 139 mmol/L (ref 135–145)
Total Bilirubin: 0.8 mg/dL (ref 0.3–1.2)
Total Protein: 7.6 g/dL (ref 6.5–8.1)

## 2019-02-16 LAB — TROPONIN I
Troponin I: 0.05 ng/mL (ref <0.03)
Troponin I: 0.04 ng/mL (ref <0.03)

## 2019-02-16 MED ORDER — FAMOTIDINE 20 MG PO TABS: 20.0000 mg | ORAL_TABLET | Freq: Two times a day (BID) | ORAL | 0 refills | Status: DC

## 2019-02-16 MED ORDER — METOCLOPRAMIDE HCL 10 MG PO TABS
10.0000 mg | ORAL_TABLET | Freq: Once | ORAL | Status: AC
  Administered 2019-02-16: 10 mg via ORAL
  Filled 2019-02-16: qty 1

## 2019-02-16 MED ORDER — FAMOTIDINE 20 MG PO TABS
20.0000 mg | ORAL_TABLET | Freq: Once | ORAL | Status: AC
  Administered 2019-02-16: 20 mg via ORAL
  Filled 2019-02-16: qty 1

## 2019-02-16 NOTE — Discharge Instructions (Addendum)
Return to the ER for new, worsening, or persistent nausea or vomiting, abdominal pain or swelling, palpitations, weakness or lightheadedness, shortness of breath, or any other new or worsening symptoms that concern you.  Continue taking all of your medications as prescribed.  We have prescribed Pepcid which should help decrease stomach acid and can help with your nausea and abdominal symptoms.  Follow-up with your regular doctors as scheduled.

## 2019-02-16 NOTE — ED Notes (Signed)
Report from [place], rn.

## 2019-02-16 NOTE — Telephone Encounter (Signed)
-----   Message from Venita Lick, NP sent at 02/16/2019  2:12 PM EST ----- Regarding: Follow-up Hey everyone.  This patient will need follow-up scheduled for next week per d/c instructions from ER.

## 2019-02-16 NOTE — ED Provider Notes (Signed)
[facility] Emergency Department Provider Note ____________________________________________   First MD Initiated Contact with Patient 02/16/19 0114     (approximate)  I have reviewed the triage vital signs and the nursing notes.   HISTORY  Chief Complaint Shortness of Breath    HPI Stephen [name] is a 53 y.o. male with PMH as noted below including CHF and renal insufficiency who presents with primarily GI symptoms with vomiting and abdominal bloating today, as well as with some shortness of breath on exertion.  The patient states that most the day his abdomen has been feeling somewhat bloated as if he had just eaten a large meal.  He also reports nausea and some vomiting earlier although this has resolved.  The patient states that he is short of breath on exertion but not at rest.  He was discharged from the hospital yesterday and states he felt well when he left.  Past Medical History:  Diagnosis Date  . AICD (automatic cardioverter/defibrillator) present   . AKI (acute kidney injury) ([place]) 12/24/2017  . Arrhythmia   . Arthritis    "lower back, knees" (06/10/2018)  . Cardiac arrest ([place]) 12/23/2017   Brief V-fib arrest  . Chest pain 09/08/2017  . CHF (congestive heart failure) (HCC)    nonischemic cardiomyopathy, EF 25%  . Gout    "on daily RX" (06/10/2018)  . Headache    "q couple months" (06/10/2018)  . High cholesterol   . History of kidney stones   . Hypertension   . Influenza A 12/24/2017  . NICM (nonischemic cardiomyopathy) ([place])   . Pneumonia 12/20/2017  . TIA (transient ischemic attack) 06/21/2016   "still affects my memory a little bit" (06/10/2018)    Patient Active Problem List   Diagnosis Date Noted  . AKI (acute kidney injury) ([place]) 12/27/2018  . 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 HTN (hypertension) 11/20/2018  . Hypokalemia 08/19/2018  . Syncope 06/30/2018  . CKD (chronic kidney disease) stage 3, GFR 30-59  ml/min (HCC) 05/13/2018  . Hyperlipidemia 05/13/2018  . ED (erectile dysfunction) 05/13/2018  . Osteoarthritis of right knee 04/30/2018  . Gout 03/26/2018  . VF (ventricular fibrillation) ([place])   . Nonischemic cardiomyopathy ([place])   . Cardiogenic shock ([place]) 12/24/2017  . 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 History of non-ST elevation myocardial infarction (NSTEMI) 09/08/2017  . Bradycardia 08/30/2017  . Ventricular tachycardia ([place]) 07/29/2017  . COPD (chronic obstructive pulmonary disease) ([place]) 07/04/2017  . TIA (transient ischemic attack) 06/22/2016  . Benign hypertensive renal disease 06/22/2016  . Acute on chronic systolic CHF (congestive heart failure) ([place]) 06/04/2016  . Chronic systolic congestive heart failure ([place]) 01/20/2016  . Cardiomyopathy ([place]) 01/20/2016    Past Surgical History:  Procedure Laterality Date  . EXTERNAL FIXATION LEG Right ~ 2000   "was going bowlegged; had to brake my leg to fix it"  . HERNIA REPAIR    . ICD IMPLANT N/A 12/30/2017   Procedure: ICD IMPLANT;  Surgeon: Deboraha Sprang, MD;  Location: [place] [facility];  Service: Cardiovascular;  Laterality: N/A;  . LEFT HEART CATH AND CORONARY ANGIOGRAPHY N/A 09/09/2017   Procedure: LEFT HEART CATH AND CORONARY ANGIOGRAPHY;  Surgeon: Teodoro Spray, MD;  Location: [place] [facility];  Service: Cardiovascular;  Laterality: N/A;  . UMBILICAL HERNIA REPAIR  1990s  . V TACH ABLATION  06/10/2018  . V TACH ABLATION N/A 06/10/2018   Procedure: V TACH ABLATION;  Surgeon: Thompson Grayer, MD;  Location: [facility]  INVASIVE CV LAB;  Service: Cardiovascular;  Laterality: N/A;  . VASECTOMY      Prior to Admission medications   Medication Sig Start Date End Date Taking? Authorizing Provider  acetaminophen (TYLENOL) 325 MG tablet Take 2 tablets (650 mg total) by mouth every 6 (six) hours as needed. Patient taking differently: Take 325-650 mg by mouth every 6 (six) hours as needed for moderate pain.  03/19/18  Yes Carrie Mew, MD  albuterol (PROVENTIL HFA;VENTOLIN HFA) 108 (90 Base) MCG/ACT inhaler Inhale 2 puffs into the lungs every 4 (four) hours as needed for wheezing or shortness of breath. 09/19/18  Yes Johnson, Megan P, DO  allopurinol (ZYLOPRIM) 100 MG tablet Take 2 tablets (200 mg total) by mouth daily. Patient taking differently: Take 200 mg by mouth daily as needed.  10/19/18  Yes Johnson, Megan P, DO  amiodarone (PACERONE) 200 MG tablet Take 1 tablet (200 mg total) by mouth daily. 01/20/19  Yes Deboraha Sprang, MD  aspirin EC 81 MG EC tablet Take 1 tablet (81 mg total) by mouth daily. 01/01/18  Yes Daune Perch, NP  atorvastatin (LIPITOR) 40 MG tablet Take 80 mg by mouth daily at 6 PM.  10/29/18  Yes [provider]  CAMPHOR-EUCALYPTUS-MENTHOL EX Apply 1 application topically as needed (congestion).   Yes [provider]  carvedilol (COREG) 3.125 MG tablet Take 1 tablet (3.125 mg total) by mouth 2 (two) times daily with a meal. Patient taking differently: Take 6.25 mg by mouth daily.  07/06/18  Yes Wieting, Richard, MD  clopidogrel (PLAVIX) 75 MG tablet TAKE ONE TABLET BY MOUTH EVERY DAY 02/13/19  Yes Bensimhon, Shaune Pascal, MD  colchicine 0.6 MG tablet Take 1 tablet (0.6 mg total) by mouth daily as needed (gout flare). 09/03/18  Yes Johnson, Megan P, DO  cyclobenzaprine (FLEXERIL) 5 MG tablet Take 1 tablet (5 mg total) by mouth 3 (three) times daily as needed (jaw pain). 11/24/18  Yes Nance Pear, MD  diclofenac sodium (VOLTAREN) 1 % GEL Apply 2 g topically 4 (four) times daily as needed (pain).    Yes [provider]  fluticasone (FLONASE) 50 MCG/ACT nasal spray Place 2 sprays into both nostrils daily. Patient taking differently: Place 2 sprays into both nostrils daily as needed for allergies.  06/20/17  Yes [name], MD  fluticasone-salmeterol (ADVAIR HFA) [phone] MCG/ACT inhaler Inhale 2 puffs into the lungs 2 (two) times daily. 08/12/18  Yes Kasa, Maretta Bees, MD  meloxicam  (MOBIC) 15 MG tablet Take 15 mg by mouth daily as needed.    Yes [provider]  Menthol, Topical Analgesic, (ICY HOT EX) Apply 1 application topically daily as needed (pain).   Yes [provider]  methimazole (TAPAZOLE) 10 MG tablet Take 4 tablets (40 mg total) by mouth daily. 12/09/18  Yes Mayo, Pete Pelt, MD  metolazone (ZAROXOLYN) 2.5 MG tablet Take 2.5 mg by mouth as needed.  09/05/18  Yes [provider]  Multiple Vitamin (MULTIVITAMIN WITH MINERALS) TABS tablet Take 1 tablet by mouth daily.   Yes [provider]  ondansetron (ZOFRAN ODT) 4 MG disintegrating tablet Take 1 tablet (4 mg total) by mouth every 8 (eight) hours as needed for nausea or vomiting. 01/24/19  Yes Harvest Dark, MD  potassium chloride SA (K-DUR,KLOR-CON) 20 MEQ tablet Take 1 tablet (20 mEq total) by mouth 4 (four) times daily. 01/14/19  Yes Deboraha Sprang, MD  promethazine (PHENERGAN) 12.5 MG tablet Take 1 tablet (12.5 mg total) by mouth every  6 (six) hours as needed for nausea or vomiting. 11/24/18  Yes Nance Pear, MD  sacubitril-valsartan (ENTRESTO) 49-51 MG Take 1 tablet by mouth 2 (two) times daily.   Yes [provider]  Tiotropium Bromide Monohydrate (SPIRIVA RESPIMAT) 2.5 MCG/ACT AERS Inhale 1 puff into the lungs daily. 08/12/18  Yes [name], MD  torsemide (DEMADEX) 20 MG tablet Take 2 tablets (40 mg total) by mouth daily. Patient taking differently: Take 40 mg by mouth 2 (two) times daily.  07/06/18  Yes Wieting, Richard, MD  famotidine (PEPCID) 20 MG tablet Take 1 tablet (20 mg total) by mouth 2 (two) times daily for 15 days. 02/16/19 03/03/19  Arta Silence, MD  nitroGLYCERIN (NITROSTAT) 0.4 MG SL tablet Place 1 tablet (0.4 mg total) under the tongue every 5 (five) minutes as needed for chest pain. 02/19/18   Bensimhon, Shaune Pascal, MD    Allergies Lisinopril  Family History  Problem Relation Age of Onset  . Hypertension Mother   . Heart failure Mother    . Hypertension Father   . CAD Father   . Heart attack Father     Social History Social History   Tobacco Use  . Smoking status: Former Smoker    Packs/day: 0.33    Years: 33.00    Pack years: 10.89    Types: Cigarettes    Last attempt to quit: 02/20/2016    Years since quitting: 2.9  . Smokeless tobacco: Never Used  Substance Use Topics  . Alcohol use: Yes    Alcohol/week: 0.0 standard drinks    Comment: 06/10/2018 "beer once/month; if that"  . Drug use: Never    Review of Systems  Constitutional: No fever. Eyes: No redness. ENT: No sore throat. Cardiovascular: Denies chest pain. Respiratory: Positive for shortness of breath. Gastrointestinal: Positive for resolved vomiting.  No diarrhea. Genitourinary: Negative for dysuria.  Musculoskeletal: Negative for back pain. Skin: Negative for rash. Neurological: Negative for headache.  ____________________________________________   PHYSICAL EXAM:  VITAL SIGNS: ED Triage Vitals  Enc Vitals Group     BP 02/16/19 0036 (!) 117/91     Pulse Rate 02/16/19 0036 97     Resp 02/16/19 0036 18     Temp 02/16/19 0036 97.9 F (36.6 C)     Temp Source 02/16/19 0036 Oral     SpO2 02/16/19 0036 99 %     Weight 02/16/19 0037 225 lb (102.1 kg)     Height 02/16/19 0037 6' 1.5" (1.867 m)     Head Circumference --      Peak Flow --      Pain Score 02/16/19 0036 6     Pain Loc --      Pain Edu? --      Excl. in [place]? --     Constitutional: Alert and oriented.  Relatively well appearing and in no acute distress. Eyes: Conjunctivae are normal.  Head: Atraumatic. Nose: No congestion/rhinnorhea. Mouth/Throat: Mucous membranes are moist.   Neck: Normal range of motion.  Cardiovascular: Normal rate, regular rhythm. Grossly normal heart sounds.  Good peripheral circulation. Respiratory: Normal respiratory effort.  No retractions. Lungs CTAB. Gastrointestinal: Soft with no focal tenderness. No significant distention.  Genitourinary:  No flank tenderness. Musculoskeletal: No lower extremity edema.  Extremities warm and well perfused.  Neurologic:  Normal speech and language. No gross focal neurologic deficits are appreciated.  Skin:  Skin is warm and dry. No rash noted. Psychiatric: Mood and affect are normal. Speech and behavior are normal.  ____________________________________________   LABS (all labs ordered are listed, but only abnormal results are displayed)  Labs Reviewed  CBC - Abnormal; Notable for the following components:      Result Value   RDW 15.9 (*)    All other components within normal limits  TROPONIN I - Abnormal; Notable for the following components:   Troponin I 0.05 (*)    All other components within normal limits  COMPREHENSIVE METABOLIC PANEL - Abnormal; Notable for the following components:   CO2 21 (*)    Glucose, Bld 131 (*)    BUN 29 (*)    Creatinine, Ser 1.61 (*)    GFR calc non Af Amer 48 (*)    GFR calc Af Amer 56 (*)    All other components within normal limits  TROPONIN I - Abnormal; Notable for the following components:   Troponin I 0.04 (*)    All other components within normal limits   ____________________________________________  EKG  ED ECG REPORT I, Arta Silence, the attending physician, personally viewed and interpreted this ECG.  Date: 02/16/2019 EKG Time: 1245 Rate: 93 Rhythm: normal sinus rhythm Intervals: Intraventricular conduction delay ST/T Wave abnormalities: LVH with repolarization abnormality Narrative Interpretation: no evidence of acute ischemia; no significant change when compared to EKG of 02/13/2019  ____________________________________________  RADIOLOGY  CXR: No acute edema or significant change  ____________________________________________   PROCEDURES  Procedure(s) performed: No  Procedures  Critical Care performed: No ____________________________________________   INITIAL IMPRESSION / ASSESSMENT AND PLAN / ED  COURSE  Pertinent labs & imaging results that were available during my care of the patient were reviewed by me and considered in my medical decision making (see chart for details).  53 year old male with PMH as noted above presents with nausea and several episodes of vomiting as well as some abdominal bloating and some shortness of breath on exertion since he left the hospital yesterday evening.  I reviewed the past medical records in Epic; I saw the patient in the ED on 02/13/2019 with tachycardia and elevated creatinine concerning for AKI.  He was admitted and evaluated by cardiology.  He was discharged yesterday evening after diuresis.  On exam today, the patient is well-appearing.  His vital signs are normal.  He has no respiratory distress.  O2 saturation is in the high 90s on room air.  His lungs are clear.  Abdomen is soft and nontender.  Overall I suspect most likely benign etiology of the symptoms given his well appearance and stable vitals.  It is possible he is having symptoms related to some persistent edema although there is no evidence of acute CHF or significant fluid overload.  The abdominal symptoms may be due to gastritis, gastroenteritis, or constipation especially after having just been in the hospital, although the patient did say he had a bowel movement today.  Given the benign abdominal exam there is no evidence for imaging at this time.  We will obtain chest x-ray and lab work-up, give symptomatic treatment with Reglan and Pepcid, and reassess.  ----------------------------------------- 6:21 AM on 02/16/2019 -----------------------------------------  The lab work-up is unremarkable.  Patient's creatinine is improved from prior, he has no elevated WBC count, no significant electrolyte abnormalities.  Initial troponin is minimally elevated and consistent with prior values.  Repeat troponin after several hours showed no change.  On reassessment, the patient states he feels  better.  He has had no nausea or vomiting and states his belly is improved.  His vital signs remained stable.  The low blood pressure readings are likely due to the fact that the cuff is on his right arm and he has been mainly lying in the bed in left lateral decubitus to sleep.  He clinically has no evidence of hypotension.  The patient is stable for discharge home at this time, and he feels comfortable going home.  I counseled him on the results of the work-up.  I will prescribe Pepcid since it seemed to help him in the ED.  I instructed him to continue his other medications as prescribed.  Return precautions given, and he expresses understanding.   ____________________________________________   FINAL CLINICAL IMPRESSION(S) / ED DIAGNOSES  Final diagnoses:  Nausea  Shortness of breath      NEW MEDICATIONS STARTED DURING THIS VISIT:  New Prescriptions   FAMOTIDINE (PEPCID) 20 MG TABLET    Take 1 tablet (20 mg total) by mouth 2 (two) times daily for 15 days.     Note:  This document was prepared using Dragon voice recognition software and may include unintentional dictation errors.    Arta Silence, MD 02/16/19 [phone]

## 2019-02-16 NOTE — ED Notes (Signed)
Patient transported to X-ray 

## 2019-02-16 NOTE — Telephone Encounter (Signed)
Attempted to reach pt on both lines. VM left for patient to return call. CRM created.

## 2019-02-16 NOTE — Discharge Summary (Signed)
[facility] NAME: Stephen [name]    MR#:  [id number]  DATE OF BIRTH:  17-Jun-1966  DATE OF ADMISSION:  02/13/2019 ADMITTING PHYSICIAN: Hillary Bow, MD  DATE OF DISCHARGE: 02/14/2019  6:10 PM  PRIMARY CARE PHYSICIAN: Park Liter P, DO   ADMISSION DIAGNOSIS:  Dehydration [E86.0] Acute kidney injury ([place]) [N17.9]  DISCHARGE DIAGNOSIS:  Active Problems:   AKI (acute kidney injury) ([place])   SECONDARY DIAGNOSIS:   Past Medical History:  Diagnosis Date  . AICD (automatic cardioverter/defibrillator) present   . AKI (acute kidney injury) ([place]) 12/24/2017  . Arrhythmia   . Arthritis    "lower back, knees" (06/10/2018)  . Cardiac arrest ([place]) 12/23/2017   Brief V-fib arrest  . Chest pain 09/08/2017  . CHF (congestive heart failure) ([facility])    nonischemic cardiomyopathy, EF 25%  . Gout    "on daily RX" (06/10/2018)  . Headache    "q couple months" (06/10/2018)  . High cholesterol   . History of kidney stones   . Hypertension   . Influenza A 12/24/2017  . NICM (nonischemic cardiomyopathy) ([place])   . Pneumonia 12/20/2017  . TIA (transient ischemic attack) 06/21/2016   "still affects my memory a little bit" (06/10/2018)     ADMITTING HISTORY  HISTORY OF PRESENT ILLNESS:  Stephen [name]  is a 53 y.o. male with a known history of nonischemic cardiomyopathy with ejection fraction 10 to 15%, AICD, CKD stage III, hypertension presents to the emergency room due to having tachycardia when his blood pressure and heart rate were checked at his pharmacy.  Patient felt okay.  At this point he has headache.  Had palpitations earlier.  Here in the emergency room his EKG looks unchanged.  He does get tachycardic into the 130s at time.  Found to have worsening renal function with creatinine at 2.3.  Baseline seems to be 1.5-1.8.  Chest x-ray shows mild pulmonary edema. No recent change in medications.  He had one episode of vomiting  earlier.   HOSPITAL COURSE:   *Acute kidney injury over CKD stage III.  Patient was admitted with IV fluids.  Creatinine is improving and is close to normal by day of discharge.  Good urine output.  His diuretics will be resumed at discharge due to cardiomyopathy and chronic systolic CHF  *Chronic systolic congestive heart failure with ejection fraction 10 to 15%.  On diuretics, Coreg, Entresto.  *Nonischemic cardiomyopathy with AICD in place.  Tachycardia.  Cardiology consulted.  Advised no change in medications and follow-up as outpatient.  Follow-up with Dr. Towanda Malkin as outpatient  Patient stable for discharge home to follow-up with primary care physician and cardiology.  CONSULTS OBTAINED:  Treatment Team:  Corey Skains, MD  DRUG ALLERGIES:   Allergies  Allergen Reactions  . Lisinopril Cough    DISCHARGE MEDICATIONS:   Allergies as of 02/14/2019      Reactions   Lisinopril Cough      Medication List    TAKE these medications   acetaminophen 325 MG tablet Commonly known as:  TYLENOL Take 2 tablets (650 mg total) by mouth every 6 (six) hours as needed. What changed:    how much to take  reasons to take this   albuterol 108 (90 Base) MCG/ACT inhaler Commonly known as:  PROVENTIL HFA;VENTOLIN HFA Inhale 2 puffs into the lungs every 4 (four) hours as needed for wheezing or shortness of breath.   allopurinol 100 MG tablet Commonly known  as:  ZYLOPRIM Take 2 tablets (200 mg total) by mouth daily. What changed:    when to take this  reasons to take this   amiodarone 200 MG tablet Commonly known as:  PACERONE Take 1 tablet (200 mg total) by mouth daily.   aspirin 81 MG EC tablet Take 1 tablet (81 mg total) by mouth daily.   atorvastatin 40 MG tablet Commonly known as:  LIPITOR Take 80 mg by mouth daily at 6 PM.   CAMPHOR-EUCALYPTUS-MENTHOL EX Apply 1 application topically as needed (congestion).   carvedilol 3.125 MG tablet Commonly known as:   COREG Take 1 tablet (3.125 mg total) by mouth 2 (two) times daily with a meal. What changed:    how much to take  when to take this   clopidogrel 75 MG tablet Commonly known as:  PLAVIX TAKE ONE TABLET BY MOUTH EVERY DAY   colchicine 0.6 MG tablet Take 1 tablet (0.6 mg total) by mouth daily as needed (gout flare).   cyclobenzaprine 5 MG tablet Commonly known as:  FLEXERIL Take 1 tablet (5 mg total) by mouth 3 (three) times daily as needed (jaw pain).   diclofenac sodium 1 % Gel Commonly known as:  VOLTAREN Apply 2 g topically 4 (four) times daily as needed (pain).   fluticasone 50 MCG/ACT nasal spray Commonly known as:  FLONASE Place 2 sprays into both nostrils daily. What changed:    when to take this  reasons to take this   fluticasone-salmeterol 115-21 MCG/ACT inhaler Commonly known as:  ADVAIR HFA Inhale 2 puffs into the lungs 2 (two) times daily.   ICY HOT EX Apply 1 application topically daily as needed (pain).   meloxicam 15 MG tablet Commonly known as:  MOBIC Take 15 mg by mouth daily as needed.   methimazole 10 MG tablet Commonly known as:  TAPAZOLE Take 4 tablets (40 mg total) by mouth daily.   metolazone 2.5 MG tablet Commonly known as:  ZAROXOLYN Take 2.5 mg by mouth as needed.   multivitamin with minerals Tabs tablet Take 1 tablet by mouth daily.   nitroGLYCERIN 0.4 MG SL tablet Commonly known as:  NITROSTAT Place 1 tablet (0.4 mg total) under the tongue every 5 (five) minutes as needed for chest pain.   ondansetron 4 MG disintegrating tablet Commonly known as:  ZOFRAN ODT Take 1 tablet (4 mg total) by mouth every 8 (eight) hours as needed for nausea or vomiting.   potassium chloride SA 20 MEQ tablet Commonly known as:  K-DUR,KLOR-CON Take 1 tablet (20 mEq total) by mouth 4 (four) times daily.   promethazine 12.5 MG tablet Commonly known as:  PHENERGAN Take 1 tablet (12.5 mg total) by mouth every 6 (six) hours as needed for nausea or  vomiting.   sacubitril-valsartan 49-51 MG Commonly known as:  ENTRESTO Take 1 tablet by mouth 2 (two) times daily.   Tiotropium Bromide Monohydrate 2.5 MCG/ACT Aers Commonly known as:  SPIRIVA RESPIMAT Inhale 1 puff into the lungs daily.   torsemide 20 MG tablet Commonly known as:  DEMADEX Take 2 tablets (40 mg total) by mouth daily. What changed:  when to take this       Today   VITAL SIGNS:  Blood pressure 95/72, pulse 73, temperature 98.3 F (36.8 C), temperature source Oral, resp. rate 19, height 6\' 1"  (1.854 m), weight 103.7 kg, SpO2 100 %.  I/O:  No intake or output data in the 24 hours ending 02/16/19 1352  PHYSICAL EXAMINATION:  Physical Exam  GENERAL:  53 y.o.-year-old patient lying in the bed with no acute distress.  LUNGS: Normal breath sounds bilaterally, no wheezing, rales,rhonchi or crepitation. No use of accessory muscles of respiration.  CARDIOVASCULAR: S1, S2 normal. No murmurs, rubs, or gallops.  ABDOMEN: Soft, non-tender, non-distended. Bowel sounds present. No organomegaly or mass.  NEUROLOGIC: Moves all 4 extremities. PSYCHIATRIC: The patient is alert and oriented x 3.  SKIN: No obvious rash, lesion, or ulcer.   DATA REVIEW:   CBC Recent Labs  Lab 02/16/19 0048  WBC 10.4  HGB 13.9  HCT 42.6  PLT 217    Chemistries  Recent Labs  Lab 02/16/19 0048  NA 139  K 4.7  CL 108  CO2 21*  GLUCOSE 131*  BUN 29*  CREATININE 1.61*  CALCIUM 9.7  AST 16  ALT 14  ALKPHOS 82  BILITOT 0.8    Cardiac Enzymes Recent Labs  Lab 02/16/19 0422  TROPONINI 0.04*    Microbiology Results  Results for orders placed or performed during the hospital encounter of 12/03/18  MRSA PCR Screening     Status: None   Collection Time: 12/04/18  3:37 AM  Result Value Ref Range Status   MRSA by PCR NEGATIVE NEGATIVE Final    Comment:        The GeneXpert MRSA Assay (FDA approved for NASAL specimens only), is one component of a comprehensive MRSA  colonization surveillance program. It is not intended to diagnose MRSA infection nor to guide or monitor treatment for MRSA infections. Performed at [facility], [address] [place], [place] [address]   Culture, sputum-assessment     Status: None   Collection Time: 12/04/18  4:28 AM  Result Value Ref Range Status   Specimen Description SPUTUM  Final   Special Requests NONE  Final   Sputum evaluation   Final    THIS SPECIMEN IS ACCEPTABLE FOR SPUTUM CULTURE Performed at [facility], [address], [place], [place] [address]    Report Status 12/04/2018 FINAL  Final  Culture, respiratory     Status: None   Collection Time: 12/04/18  4:28 AM  Result Value Ref Range Status   Specimen Description   Final    SPUTUM Performed at [facility], [address], [place], [place] [address]    Special Requests   Final    NONE Reflexed from [id number] Performed at [facility], [address] [place], [place] [address]    Gram Stain   Final    RARE WBC PRESENT,BOTH PMN AND MONONUCLEAR FEW SQUAMOUS EPITHELIAL CELLS PRESENT ABUNDANT GRAM POSITIVE COCCI FEW GRAM POSITIVE RODS FEW GRAM NEGATIVE RODS    Culture   Final    ABUNDANT Consistent with normal respiratory flora. Performed at [facility], [place] [address], [place], [place] [address]    Report Status 12/06/2018 FINAL  Final  Culture, blood (routine x 2) Call MD if unable to obtain prior to antibiotics being given     Status: None   Collection Time: 12/04/18  6:11 AM  Result Value Ref Range Status   Specimen Description BLOOD RIGHT FA  Final   Special Requests   Final    BOTTLES DRAWN AEROBIC AND ANAEROBIC Blood Culture adequate volume   Culture   Final    NO GROWTH 5 DAYS Performed at [facility], [address], [place], [place] [address]    Report Status 12/09/2018 FINAL  Final  Culture, blood (routine  x 2) Call MD if unable to obtain prior to antibiotics being  given     Status: None   Collection Time: 12/04/18  6:19 AM  Result Value Ref Range Status   Specimen Description BLOOD LEFT FA  Final   Special Requests   Final    BOTTLES DRAWN AEROBIC AND ANAEROBIC Blood Culture results may not be optimal due to an excessive volume of blood received in culture bottles   Culture   Final    NO GROWTH 5 DAYS Performed at [facility], [address] [place], [place] [address]    Report Status 12/09/2018 FINAL  Final    RADIOLOGY:  Dg Chest 2 View  Result Date: 02/16/2019 CLINICAL DATA:  Dyspnea and vomiting. EXAM: CHEST - 2 VIEW COMPARISON:  02/13/2019 FINDINGS: Stable cardiomegaly. Left-sided AICD device with lead in the right ventricle appears stable. Mild interstitial edema is redemonstrated. No significant effusion or pneumothorax. No significant change from prior. No acute osseous abnormality IMPRESSION: Stable cardiomegaly with mild interstitial edema. Stable cardiomegaly. Mild diffuse interstitial edema. No significant change from prior. Electronically Signed   By: Ashley Royalty M.D.   On: 02/16/2019 01:19    Follow up with PCP in 1 week.  Management plans discussed with the patient, family and they are in agreement.  CODE STATUS:  Code Status History    Date Active Date Inactive Code Status Order ID Comments User Context   02/13/2019 1451 02/14/2019 2116 Full Code [id number]  Hillary Bow, MD ED   12/27/2018 0600 12/28/2018 1829 Partial Code [id number]  Harrie Foreman, MD Inpatient   12/04/2018 0437 12/10/2018 2037 Partial Code [id number]  Bradly Bienenstock, NP Inpatient   12/04/2018 0147 12/04/2018 0437 Full Code [id number]  Henreitta Leber, MD ED   08/19/2018 2248 08/20/2018 2258 Full Code [id number]  Amelia Jo, MD Inpatient   06/30/2018 1757 07/06/2018 1647 Full Code [id number]  [name], MD Inpatient   06/10/2018 1606 06/11/2018 1537 Full Code [id number]  Thompson Grayer, MD Inpatient   12/23/2017 1153 12/31/2017 1729 Full Code  [id number]  Erlene Quan, PA-C Inpatient   12/20/2017 1851 12/23/2017 1110 Full Code [id number]  [name], MD Inpatient   09/08/2017 0644 09/10/2017 2127 Full Code [id number]  Harrie Foreman, MD Inpatient   07/29/2017 1044 07/31/2017 2152 Full Code [id number]  Henreitta Leber, MD Inpatient   06/19/2017 0027 06/20/2017 2114 Full Code [id number]  Lance Coon, MD ED   05/20/2017 1226 05/21/2017 1759 Full Code [id number]  [name], MD Inpatient   06/22/2016 0430 06/22/2016 2150 Full Code [id number]  Quintella Baton, MD Inpatient   06/04/2016 1843 06/06/2016 1815 Full Code [id number]  [name], MD Inpatient      TOTAL TIME TAKING CARE OF THIS PATIENT ON DAY OF DISCHARGE: more than 30 minutes.   Leia Alf Meklit Cotta M.D on 02/16/2019 at 1:52 PM  Between 7am to 6pm - Pager - [phone]  After 6pm go to [email or web address] - password [email or web address] [facility]  Office  [phone]  CC: Primary care physician; Valerie Roys, DO  Note: This dictation was prepared with Dragon dictation along with smaller phrase technology. Any transcriptional errors that result from this process are unintentional.

## 2019-02-16 NOTE — ED Notes (Signed)
Date and time results received: 02/16/19 1:20 AM Test: Troponin Critical Value: 0.05 Name of Provider Notified: SS Orders Received? No New orders received  Or Actions Taken?:

## 2019-02-16 NOTE — ED Triage Notes (Signed)
Patient states that he was discharged from the hospital Saturday night for tachycardia. Patient states that today he has been feeling short of breath and vomiting.

## 2019-02-17 ENCOUNTER — Emergency Department: Payer: Commercial Managed Care - PPO

## 2019-02-17 ENCOUNTER — Other Ambulatory Visit: Payer: Self-pay

## 2019-02-17 ENCOUNTER — Encounter: Payer: Commercial Managed Care - PPO | Admitting: Internal Medicine

## 2019-02-17 ENCOUNTER — Inpatient Hospital Stay
Admission: EM | Admit: 2019-02-17 | Discharge: 2019-03-01 | DRG: 682 | Disposition: A | Discharge status: Home / Self Care | Payer: Commercial Managed Care - PPO | Attending: Internal Medicine | Admitting: Internal Medicine

## 2019-02-17 DIAGNOSIS — I5023 Acute on chronic systolic (congestive) heart failure: Secondary | ICD-10-CM | POA: Diagnosis not present

## 2019-02-17 DIAGNOSIS — Z7951 Long term (current) use of inhaled steroids: Secondary | ICD-10-CM | POA: Diagnosis not present

## 2019-02-17 DIAGNOSIS — R7989 Other specified abnormal findings of blood chemistry: Secondary | ICD-10-CM

## 2019-02-17 DIAGNOSIS — A084 Viral intestinal infection, unspecified: Secondary | ICD-10-CM | POA: Diagnosis present

## 2019-02-17 DIAGNOSIS — E0590 Thyrotoxicosis, unspecified without thyrotoxic crisis or storm: Secondary | ICD-10-CM | POA: Diagnosis present

## 2019-02-17 DIAGNOSIS — Z8249 Family history of ischemic heart disease and other diseases of the circulatory system: Secondary | ICD-10-CM

## 2019-02-17 DIAGNOSIS — I429 Cardiomyopathy, unspecified: Secondary | ICD-10-CM | POA: Diagnosis not present

## 2019-02-17 DIAGNOSIS — I130 Hypertensive heart and chronic kidney disease with heart failure and stage 1 through stage 4 chronic kidney disease, or unspecified chronic kidney disease: Secondary | ICD-10-CM | POA: Diagnosis present

## 2019-02-17 DIAGNOSIS — N179 Acute kidney failure, unspecified: Secondary | ICD-10-CM | POA: Diagnosis not present

## 2019-02-17 DIAGNOSIS — M25572 Pain in left ankle and joints of left foot: Secondary | ICD-10-CM | POA: Diagnosis not present

## 2019-02-17 DIAGNOSIS — I129 Hypertensive chronic kidney disease with stage 1 through stage 4 chronic kidney disease, or unspecified chronic kidney disease: Secondary | ICD-10-CM | POA: Diagnosis not present

## 2019-02-17 DIAGNOSIS — M109 Gout, unspecified: Secondary | ICD-10-CM | POA: Diagnosis present

## 2019-02-17 DIAGNOSIS — R140 Abdominal distension (gaseous): Secondary | ICD-10-CM

## 2019-02-17 DIAGNOSIS — R112 Nausea with vomiting, unspecified: Secondary | ICD-10-CM | POA: Diagnosis not present

## 2019-02-17 DIAGNOSIS — E8770 Fluid overload, unspecified: Secondary | ICD-10-CM | POA: Diagnosis not present

## 2019-02-17 DIAGNOSIS — N183 Chronic kidney disease, stage 3 (moderate): Secondary | ICD-10-CM | POA: Diagnosis present

## 2019-02-17 DIAGNOSIS — Z95810 Presence of automatic (implantable) cardiac defibrillator: Secondary | ICD-10-CM | POA: Diagnosis not present

## 2019-02-17 DIAGNOSIS — R778 Other specified abnormalities of plasma proteins: Secondary | ICD-10-CM

## 2019-02-17 DIAGNOSIS — M170 Bilateral primary osteoarthritis of knee: Secondary | ICD-10-CM | POA: Diagnosis present

## 2019-02-17 DIAGNOSIS — R0602 Shortness of breath: Secondary | ICD-10-CM

## 2019-02-17 DIAGNOSIS — E7800 Pure hypercholesterolemia, unspecified: Secondary | ICD-10-CM | POA: Diagnosis present

## 2019-02-17 DIAGNOSIS — I959 Hypotension, unspecified: Secondary | ICD-10-CM | POA: Diagnosis present

## 2019-02-17 DIAGNOSIS — Z888 Allergy status to other drugs, medicaments and biological substances status: Secondary | ICD-10-CM

## 2019-02-17 DIAGNOSIS — E785 Hyperlipidemia, unspecified: Secondary | ICD-10-CM | POA: Diagnosis present

## 2019-02-17 DIAGNOSIS — Z7902 Long term (current) use of antithrombotics/antiplatelets: Secondary | ICD-10-CM | POA: Diagnosis not present

## 2019-02-17 DIAGNOSIS — E876 Hypokalemia: Secondary | ICD-10-CM | POA: Diagnosis not present

## 2019-02-17 DIAGNOSIS — I5043 Acute on chronic combined systolic (congestive) and diastolic (congestive) heart failure: Secondary | ICD-10-CM | POA: Diagnosis present

## 2019-02-17 DIAGNOSIS — Z87891 Personal history of nicotine dependence: Secondary | ICD-10-CM

## 2019-02-17 DIAGNOSIS — I509 Heart failure, unspecified: Secondary | ICD-10-CM | POA: Diagnosis not present

## 2019-02-17 DIAGNOSIS — I5042 Chronic combined systolic (congestive) and diastolic (congestive) heart failure: Secondary | ICD-10-CM | POA: Diagnosis not present

## 2019-02-17 DIAGNOSIS — J449 Chronic obstructive pulmonary disease, unspecified: Secondary | ICD-10-CM | POA: Diagnosis present

## 2019-02-17 DIAGNOSIS — M479 Spondylosis, unspecified: Secondary | ICD-10-CM | POA: Diagnosis present

## 2019-02-17 DIAGNOSIS — Z7982 Long term (current) use of aspirin: Secondary | ICD-10-CM

## 2019-02-17 DIAGNOSIS — Z8674 Personal history of sudden cardiac arrest: Secondary | ICD-10-CM | POA: Diagnosis not present

## 2019-02-17 DIAGNOSIS — R188 Other ascites: Secondary | ICD-10-CM

## 2019-02-17 DIAGNOSIS — R749 Abnormal serum enzyme level, unspecified: Secondary | ICD-10-CM | POA: Diagnosis not present

## 2019-02-17 DIAGNOSIS — N182 Chronic kidney disease, stage 2 (mild): Secondary | ICD-10-CM | POA: Diagnosis not present

## 2019-02-17 DIAGNOSIS — Z791 Long term (current) use of non-steroidal anti-inflammatories (NSAID): Secondary | ICD-10-CM

## 2019-02-17 DIAGNOSIS — I428 Other cardiomyopathies: Secondary | ICD-10-CM | POA: Diagnosis present

## 2019-02-17 DIAGNOSIS — R1084 Generalized abdominal pain: Secondary | ICD-10-CM

## 2019-02-17 DIAGNOSIS — I5022 Chronic systolic (congestive) heart failure: Secondary | ICD-10-CM | POA: Diagnosis not present

## 2019-02-17 DIAGNOSIS — I69319 Unspecified symptoms and signs involving cognitive functions following cerebral infarction: Secondary | ICD-10-CM | POA: Diagnosis not present

## 2019-02-17 LAB — CBC
HCT: 43.2 % (ref 39.0–52.0)
Hemoglobin: 14.1 g/dL (ref 13.0–17.0)
MCH: 28.2 pg (ref 26.0–34.0)
MCHC: 32.6 g/dL (ref 30.0–36.0)
MCV: 86.4 fL (ref 80.0–100.0)
Platelets: 216 10*3/uL (ref 150–400)
RBC: 5 MIL/uL (ref 4.22–5.81)
RDW: 15.9 % — ABNORMAL HIGH (ref 11.5–15.5)
WBC: 8.1 10*3/uL (ref 4.0–10.5)
nRBC: 0 % (ref 0.0–0.2)

## 2019-02-17 LAB — TROPONIN I
Troponin I: 0.04 ng/mL (ref <0.03)
Troponin I: 0.07 ng/mL (ref <0.03)
Troponin I: 0.08 ng/mL (ref <0.03)

## 2019-02-17 LAB — BASIC METABOLIC PANEL
Anion gap: 13 (ref 5–15)
BUN: 24 mg/dL — ABNORMAL HIGH (ref 6–20)
CO2: 22 mmol/L (ref 22–32)
Calcium: 9.8 mg/dL (ref 8.9–10.3)
Chloride: 105 mmol/L (ref 98–111)
Creatinine, Ser: 2.06 mg/dL — ABNORMAL HIGH (ref 0.61–1.24)
GFR calc Af Amer: 41 mL/min — ABNORMAL LOW (ref >60)
GFR calc non Af Amer: 36 mL/min — ABNORMAL LOW (ref >60)
GLUCOSE: 149 mg/dL — AB (ref 70–99)
Potassium: 3.6 mmol/L (ref 3.5–5.1)
Sodium: 140 mmol/L (ref 135–145)

## 2019-02-17 MED ORDER — SODIUM CHLORIDE 0.9% FLUSH: 3.0000 mL | Freq: Once | INTRAVENOUS | Status: DC

## 2019-02-17 MED ORDER — SODIUM CHLORIDE 0.9 % IV BOLUS
250.0000 mL | Freq: Once | INTRAVENOUS | Status: AC
  Administered 2019-02-17: 250 mL via INTRAVENOUS

## 2019-02-17 MED ORDER — PANTOPRAZOLE SODIUM 40 MG IV SOLR
40.0000 mg | INTRAVENOUS | Status: DC
  Administered 2019-02-17 – 2019-02-18 (×2): 40 mg via INTRAVENOUS
  Filled 2019-02-17 (×2): qty 40

## 2019-02-17 MED ORDER — PROMETHAZINE HCL 25 MG/ML IJ SOLN
25.0000 mg | Freq: Once | INTRAMUSCULAR | Status: AC
  Administered 2019-02-17: 25 mg via INTRAVENOUS
  Filled 2019-02-17: qty 1

## 2019-02-17 NOTE — ED Triage Notes (Signed)
Pt was seen here yesterday with c/o N/V SOB states he is not feeling any better and has had increased vomiting, states the nausea meds we gave him did not help

## 2019-02-17 NOTE — Progress Notes (Signed)
Family Meeting Note  Advance Directive:yes  Today a meeting took place with the Patient.    The following clinical team members were present during this meeting:MD  The following were discussed:Patient's diagnosis: Acute kidney injury on chronic kidney disease chronic cardiomyopathy with combined congestive heart failure, history of essential hypertension but currently hypotensive, chronic COPD currently not smoking, elevated troponin, palpitations will be admitted to the hospital, treatment plan of care discussed in detail with the patient.  He is aware that his EKG is abnormal and cardiology consult is placed to [facility] cardiology group  , Patient's progosis: Unable to determine and Goals for treatment: Full Code, Sister Charlene Brooke is the healthcare POA  Additional follow-up to be provided: Hospitalist, cardiology  Time spent during discussion:17 MIN  [name], MD

## 2019-02-17 NOTE — H&P (Addendum)
[facility] [place] NAME: Stephen [name]    MR#:  [id number]  DATE OF BIRTH:  09-10-1966  DATE OF ADMISSION:  02/17/2019  PRIMARY CARE PHYSICIAN: Valerie Roys, DO   REQUESTING/REFERRING PHYSICIAN: Earleen Newport, MD   CHIEF COMPLAINT:  Emesis and Shortness of Breath  HISTORY OF PRESENT ILLNESS:  Stephen [name]  is a 53 y.o. 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  Patient is also reporting palpitations.  He sees [facility] cardiology Dr. Silverio Lay as an outpatient.  Denies any chest pain.  Troponin is elevated.  Patient came into the emergency department last week he was given some IV fluids and discharged home patient again came back with similar complaint of intractable nausea and vomiting on Sunday and got discharged home home again but clinically he is not feeling better ,came into the emergency department EKG with bigeminy  PAST MEDICAL HISTORY:   Past Medical History:  Diagnosis Date  . AICD (automatic cardioverter/defibrillator) present   . AKI (acute kidney injury) ([place]) 12/24/2017  . Arrhythmia   . Arthritis    "lower back, knees" (06/10/2018)  . Cardiac arrest ([place]) 12/23/2017   Brief V-fib arrest  . Chest pain 09/08/2017  . CHF (congestive heart failure) (HCC)    nonischemic cardiomyopathy, EF 25%  . Gout    "on daily RX" (06/10/2018)  . Headache    "q couple months" (06/10/2018)  . High cholesterol   . History of kidney stones   . Hypertension   . Influenza A 12/24/2017  . NICM (nonischemic cardiomyopathy) ([place])   . Pneumonia 12/20/2017  . TIA (transient ischemic attack) 06/21/2016   "still affects my memory a little bit" (06/10/2018)    PAST SURGICAL  HISTOIRY:   Past Surgical History:  Procedure Laterality Date  . EXTERNAL FIXATION LEG Right ~ 2000   "was going bowlegged; had to brake my leg to fix it"  . HERNIA REPAIR    . ICD IMPLANT N/A 12/30/2017   Procedure: ICD IMPLANT;  Surgeon: Deboraha Sprang, MD;  Location: [place] [facility];  Service: Cardiovascular;  Laterality: N/A;  . LEFT HEART CATH AND CORONARY ANGIOGRAPHY N/A 09/09/2017   Procedure: LEFT HEART CATH AND CORONARY ANGIOGRAPHY;  Surgeon: Teodoro Spray, MD;  Location: [place] [facility];  Service: Cardiovascular;  Laterality: N/A;  . UMBILICAL HERNIA REPAIR  1990s  . V TACH ABLATION  06/10/2018  . V TACH ABLATION N/A 06/10/2018   Procedure: V TACH ABLATION;  Surgeon: Thompson Grayer, MD;  Location: [place] [facility];  Service: Cardiovascular;  Laterality: N/A;  . VASECTOMY      SOCIAL HISTORY:   Social History   Tobacco Use  . Smoking status: Former Smoker    Packs/day: 0.33    Years: 33.00    Pack years: 10.89    Types: Cigarettes    Last attempt to quit: 02/20/2016    Years since quitting: 2.9  . Smokeless tobacco: Never Used  Substance Use Topics  . Alcohol use: Yes    Alcohol/week: 0.0 standard drinks    Comment: 06/10/2018 "beer once/month; if that"    FAMILY HISTORY:   Family History  Problem Relation Age of Onset  . Hypertension Mother   .  Heart failure Mother   . Hypertension Father   . CAD Father   . Heart attack Father     DRUG ALLERGIES:   Allergies  Allergen Reactions  . Lisinopril Cough    REVIEW OF SYSTEMS:  CONSTITUTIONAL: No fever, fatigue or weakness.  EYES: No blurred or double vision.  EARS, NOSE, AND THROAT: No tinnitus or ear pain.  RESPIRATORY: No cough, shortness of breath, wheezing or hemoptysis.  CARDIOVASCULAR: No chest pain, orthopnea, edema.  GASTROINTESTINAL: Reporting nausea, vomiting, denies diarrhea or abdominal pain.  GENITOURINARY: No dysuria, hematuria.  ENDOCRINE: No polyuria, nocturia,  HEMATOLOGY: No  anemia, easy bruising or bleeding SKIN: No rash or lesion. MUSCULOSKELETAL: No joint pain or arthritis.   NEUROLOGIC: No tingling, numbness, weakness.  PSYCHIATRY: No anxiety or depression.   MEDICATIONS AT HOME:   Prior to Admission medications   Medication Sig Start Date End Date Taking? Authorizing Provider  acetaminophen (TYLENOL) 325 MG tablet Take 2 tablets (650 mg total) by mouth every 6 (six) hours as needed. Patient taking differently: Take 325-650 mg by mouth every 6 (six) hours as needed for moderate pain.  03/19/18  Yes Carrie Mew, MD  albuterol (PROVENTIL HFA;VENTOLIN HFA) 108 (90 Base) MCG/ACT inhaler Inhale 2 puffs into the lungs every 4 (four) hours as needed for wheezing or shortness of breath. 09/19/18  Yes Johnson, Megan P, DO  allopurinol (ZYLOPRIM) 100 MG tablet Take 2 tablets (200 mg total) by mouth daily. Patient taking differently: Take 200 mg by mouth daily as needed.  10/19/18  Yes Johnson, Megan P, DO  amiodarone (PACERONE) 200 MG tablet Take 1 tablet (200 mg total) by mouth daily. 01/20/19  Yes Deboraha Sprang, MD  aspirin EC 81 MG EC tablet Take 1 tablet (81 mg total) by mouth daily. 01/01/18  Yes Daune Perch, NP  atorvastatin (LIPITOR) 40 MG tablet Take 80 mg by mouth daily at 6 PM.  10/29/18  Yes [provider]  CAMPHOR-EUCALYPTUS-MENTHOL EX Apply 1 application topically as needed (congestion).   Yes [provider]  carvedilol (COREG) 3.125 MG tablet Take 1 tablet (3.125 mg total) by mouth 2 (two) times daily with a meal. Patient taking differently: Take 6.25 mg by mouth daily.  07/06/18  Yes Wieting, Richard, MD  clopidogrel (PLAVIX) 75 MG tablet TAKE ONE TABLET BY MOUTH EVERY DAY Patient taking differently: Take 75 mg by mouth daily.  02/13/19  Yes Bensimhon, Shaune Pascal, MD  colchicine 0.6 MG tablet Take 1 tablet (0.6 mg total) by mouth daily as needed (gout flare). 09/03/18  Yes Johnson, Megan P, DO  cyclobenzaprine (FLEXERIL) 5 MG tablet  Take 1 tablet (5 mg total) by mouth 3 (three) times daily as needed (jaw pain). 11/24/18  Yes Nance Pear, MD  diclofenac sodium (VOLTAREN) 1 % GEL Apply 2 g topically 4 (four) times daily as needed (pain).    Yes [provider]  famotidine (PEPCID) 20 MG tablet Take 1 tablet (20 mg total) by mouth 2 (two) times daily for 15 days. 02/16/19 03/03/19 Yes Arta Silence, MD  fluticasone (FLONASE) 50 MCG/ACT nasal spray Place 2 sprays into both nostrils daily. Patient taking differently: Place 2 sprays into both nostrils daily as needed for allergies.  06/20/17  Yes [name], MD  fluticasone-salmeterol (ADVAIR HFA) [phone] MCG/ACT inhaler Inhale 2 puffs into the lungs 2 (two) times daily. 08/12/18  Yes Kasa, Maretta Bees, MD  meloxicam (MOBIC) 15 MG tablet Take 15 mg by mouth daily as needed for pain.  Yes [provider]  Menthol, Topical Analgesic, (ICY HOT EX) Apply 1 application topically daily as needed (pain).   Yes [provider]  methimazole (TAPAZOLE) 10 MG tablet Take 4 tablets (40 mg total) by mouth daily. 12/09/18  Yes Mayo, Pete Pelt, MD  metolazone (ZAROXOLYN) 2.5 MG tablet Take 2.5 mg by mouth as needed.  09/05/18  Yes [provider]  Multiple Vitamin (MULTIVITAMIN WITH MINERALS) TABS tablet Take 1 tablet by mouth daily.   Yes [provider]  nitroGLYCERIN (NITROSTAT) 0.4 MG SL tablet Place 1 tablet (0.4 mg total) under the tongue every 5 (five) minutes as needed for chest pain. 02/19/18  Yes Bensimhon, Shaune Pascal, MD  ondansetron (ZOFRAN ODT) 4 MG disintegrating tablet Take 1 tablet (4 mg total) by mouth every 8 (eight) hours as needed for nausea or vomiting. 01/24/19  Yes Harvest Dark, MD  potassium chloride SA (K-DUR,KLOR-CON) 20 MEQ tablet Take 1 tablet (20 mEq total) by mouth 4 (four) times daily. 01/14/19  Yes Deboraha Sprang, MD  promethazine (PHENERGAN) 12.5 MG tablet Take 1 tablet (12.5 mg total) by mouth every 6 (six) hours as  needed for nausea or vomiting. 11/24/18  Yes Nance Pear, MD  sacubitril-valsartan (ENTRESTO) 49-51 MG Take 1 tablet by mouth 2 (two) times daily.   Yes [provider]  Tiotropium Bromide Monohydrate (SPIRIVA RESPIMAT) 2.5 MCG/ACT AERS Inhale 1 puff into the lungs daily. 08/12/18  Yes [name], MD  torsemide (DEMADEX) 20 MG tablet Take 2 tablets (40 mg total) by mouth daily. Patient taking differently: Take 40 mg by mouth 2 (two) times daily.  07/06/18  Yes Wieting, Richard, MD      VITAL SIGNS:  Blood pressure 126/88, pulse 92, temperature 98.5 F (36.9 C), temperature source Oral, resp. rate 18, height 6\' 1"  (1.854 m), weight 102.1 kg, SpO2 98 %.  PHYSICAL EXAMINATION:  GENERAL:  53 y.o.-year-old patient lying in the bed with no acute distress.  EYES: Pupils equal, round, reactive to light and accommodation. No scleral icterus. Extraocular muscles intact.  HEENT: Head atraumatic, normocephalic. Oropharynx and nasopharynx clear.  Dry mucous membranes NECK:  Supple, no jugular venous distention. No thyroid enlargement, no tenderness.  LUNGS: Normal breath sounds bilaterally, no wheezing, rales,rhonchi or crepitation. No use of accessory muscles of respiration.  CARDIOVASCULAR: S1, S2 normal. No murmurs, rubs, or gallops.  ABDOMEN: Soft, nontender, nondistended. Bowel sounds present. No organomegaly or mass.  EXTREMITIES: No pedal edema, cyanosis, or clubbing.  NEUROLOGIC: Awake, alert and oriented x3 sensation intact. Gait not checked.  PSYCHIATRIC: The patient is alert and oriented x 3.  SKIN: No obvious rash, lesion, or ulcer.   LABORATORY PANEL:   CBC Recent Labs  Lab 02/17/19 1254  WBC 8.1  HGB 14.1  HCT 43.2  PLT 216   ------------------------------------------------------------------------------------------------------------------  Chemistries  Recent Labs  Lab 02/16/19 0048 02/17/19 1254  NA 139 140  K 4.7 3.6  CL 108 105  CO2 21* 22  GLUCOSE 131*  149*  BUN 29* 24*  CREATININE 1.61* 2.06*  CALCIUM 9.7 9.8  AST 16  --   ALT 14  --   ALKPHOS 82  --   BILITOT 0.8  --    ------------------------------------------------------------------------------------------------------------------  Cardiac Enzymes Recent Labs  Lab 02/17/19 2132  TROPONINI 0.08*   ------------------------------------------------------------------------------------------------------------------  RADIOLOGY:  Dg Chest 2 View  Result Date: 02/17/2019 CLINICAL DATA:  Shortness of breath. EXAM: CHEST - 2 VIEW COMPARISON:  Radiographs of February 16, 2019. FINDINGS:  Stable cardiomegaly. Single lead left-sided pacemaker is unchanged in position. Minimal Kerley B lines are noted laterally in right lung base which may represent minimal edema. Left lung is unremarkable. No pneumothorax or pleural effusion is noted. Bony thorax is unremarkable. IMPRESSION: Possible minimal edema seen in right lung base. Stable cardiomegaly. Electronically Signed   By: Marijo Conception, M.D.   On: 02/17/2019 13:47   Dg Chest 2 View  Result Date: 02/16/2019 CLINICAL DATA:  Dyspnea and vomiting. EXAM: CHEST - 2 VIEW COMPARISON:  02/13/2019 FINDINGS: Stable cardiomegaly. Left-sided AICD device with lead in the right ventricle appears stable. Mild interstitial edema is redemonstrated. No significant effusion or pneumothorax. No significant change from prior. No acute osseous abnormality IMPRESSION: Stable cardiomegaly with mild interstitial edema. Stable cardiomegaly. Mild diffuse interstitial edema. No significant change from prior. Electronically Signed   By: Ashley Royalty M.D.   On: 02/16/2019 01:19    EKG:   Orders placed or performed during the hospital encounter of 02/17/19  . ED EKG  . ED EKG  . ED EKG  . ED EKG    IMPRESSION AND PLAN:   #Acute kidney injury on chronic kidney disease Admit to MedSurg unit Hydrate with IV fluids with close monitoring of the renal function and urine  output Avoid nephrotoxins  #Intractable nausea and vomiting Clear liquid diet, IV Protonix Hydration with IV fluids Supportive treatment  #Elevated troponin- Monitor patient on telemetry and cycle cardiac biomarkers Troponin 0 0.04-0.07 Cardiology consult placed to [facility] group, secure text message sent to on-call cardiologist Dr. Ubaldo Glassing, message seen by him EKG with bigeminy Check TSH  #Cardiomyopathy with history of combined CHF ejection fraction 25% status post AICD Hold hypertensive medications and diuretics in view of hypotension  #Chronic COPD currently with no exacerbation Bronchodilator treatments  DVT prophylaxis with Lovenox subcu renal dose adjusted  All the records are reviewed and case discussed with ED provider. Management plans discussed with the patient, family and they are in agreement.  CODE STATUS: fc  TOTAL TIME TAKING CARE OF THIS PATIENT: 43 minutes.   Note: This dictation was prepared with Dragon dictation along with smaller phrase technology. Any transcriptional errors that result from this process are unintentional.  [name] M.D on 02/17/2019 at 10:31 PM  Between 7am to 6pm - Pager - [phone]  After 6pm go to [email or web address] - password [email or web address] [facility]  Office  [phone]  CC: Primary care physician; Valerie Roys, DO

## 2019-02-17 NOTE — ED Notes (Signed)
Pt is resting in bed watching TV. NADN.

## 2019-02-17 NOTE — Telephone Encounter (Signed)
Spoke w/ patient. He stated his insurance has lapsed. He does not want to come in until it is taken care of. Advised patient to call insurance company ASAP and call us back to schedule.

## 2019-02-17 NOTE — ED Notes (Signed)
Dr Williams at bedside 

## 2019-02-17 NOTE — ED Provider Notes (Addendum)
[facility] Emergency Department Provider Note       Time seen: ----------------------------------------- 5:35 PM on 02/17/2019 -----------------------------------------   I have reviewed the triage vital signs and the nursing notes.  HISTORY   Chief Complaint Emesis and Shortness of Breath    HPI Stephen [name] is a 53 y.o. male with a history of acute kidney injury, arthritis, chest pain, CHF, gout, hyperlipidemia, hypertension, influenza A, cardiomyopathy who presents to the ED for nausea, vomiting and shortness of breath.  Patient states he was seen yesterday and is not feeling any better and has had increased vomiting.  Patient states the medications given to him yesterday did not help.  Past Medical History:  Diagnosis Date  . AICD (automatic cardioverter/defibrillator) present   . AKI (acute kidney injury) ([place]) 12/24/2017  . Arrhythmia   . Arthritis    "lower back, knees" (06/10/2018)  . Cardiac arrest ([place]) 12/23/2017   Brief V-fib arrest  . Chest pain 09/08/2017  . CHF (congestive heart failure) (HCC)    nonischemic cardiomyopathy, EF 25%  . Gout    "on daily RX" (06/10/2018)  . Headache    "q couple months" (06/10/2018)  . High cholesterol   . History of kidney stones   . Hypertension   . Influenza A 12/24/2017  . NICM (nonischemic cardiomyopathy) ([place])   . Pneumonia 12/20/2017  . TIA (transient ischemic attack) 06/21/2016   "still affects my memory a little bit" (06/10/2018)    Patient Active Problem List   Diagnosis Date Noted  . AKI (acute kidney injury) ([place]) 12/27/2018  . 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 HTN (hypertension) 11/20/2018  . Hypokalemia 08/19/2018  . Syncope 06/30/2018  . CKD (chronic kidney disease) stage 3, GFR 30-59 ml/min (HCC) 05/13/2018  . Hyperlipidemia 05/13/2018  . ED (erectile dysfunction) 05/13/2018  . Osteoarthritis of right knee 04/30/2018  . Gout 03/26/2018  . VF (ventricular  fibrillation) ([place])   . Nonischemic cardiomyopathy ([place])   . Cardiogenic shock ([place]) 12/24/2017  . 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 History of non-ST elevation myocardial infarction (NSTEMI) 09/08/2017  . Bradycardia 08/30/2017  . Ventricular tachycardia ([place]) 07/29/2017  . COPD (chronic obstructive pulmonary disease) ([place]) 07/04/2017  . TIA (transient ischemic attack) 06/22/2016  . Benign hypertensive renal disease 06/22/2016  . Acute on chronic systolic CHF (congestive heart failure) ([place]) 06/04/2016  . Chronic systolic congestive heart failure ([place]) 01/20/2016  . Cardiomyopathy ([place]) 01/20/2016    Past Surgical History:  Procedure Laterality Date  . EXTERNAL FIXATION LEG Right ~ 2000   "was going bowlegged; had to brake my leg to fix it"  . HERNIA REPAIR    . ICD IMPLANT N/A 12/30/2017   Procedure: ICD IMPLANT;  Surgeon: Deboraha Sprang, MD;  Location: [place] [facility];  Service: Cardiovascular;  Laterality: N/A;  . LEFT HEART CATH AND CORONARY ANGIOGRAPHY N/A 09/09/2017   Procedure: LEFT HEART CATH AND CORONARY ANGIOGRAPHY;  Surgeon: Teodoro Spray, MD;  Location: [place] [facility];  Service: Cardiovascular;  Laterality: N/A;  . UMBILICAL HERNIA REPAIR  1990s  . V TACH ABLATION  06/10/2018  . V TACH ABLATION N/A 06/10/2018   Procedure: V TACH ABLATION;  Surgeon: Thompson Grayer, MD;  Location: [place] [facility];  Service: Cardiovascular;  Laterality: N/A;  . VASECTOMY      Allergies Lisinopril  Social History Social History   Tobacco Use  . Smoking status: Former Smoker    Packs/day: 0.33  Years: 33.00    Pack years: 10.89    Types: Cigarettes    Last attempt to quit: 02/20/2016    Years since quitting: 2.9  . Smokeless tobacco: Never Used  Substance Use Topics  . Alcohol use: Yes    Alcohol/week: 0.0 standard drinks    Comment: 06/10/2018 "beer once/month; if that"  . Drug use: Never   Review of Systems Constitutional: Negative for fever. Cardiovascular:  Negative for chest pain. Respiratory: Positive for shortness of breath Gastrointestinal: Negative for abdominal pain, positive for nausea and vomiting Musculoskeletal: Negative for back pain. Skin: Negative for rash. Neurological: Negative for headaches, positive for weakness  All systems negative/normal/unremarkable except as stated in the HPI  ____________________________________________   PHYSICAL EXAM:  VITAL SIGNS: ED Triage Vitals  Enc Vitals Group     BP 02/17/19 1251 110/75     Pulse Rate 02/17/19 1251 (!) 104     Resp 02/17/19 1251 17     Temp 02/17/19 1251 98.5 F (36.9 C)     Temp Source 02/17/19 1251 Oral     SpO2 02/17/19 1251 98 %     Weight 02/17/19 1252 225 lb (102.1 kg)     Height 02/17/19 1252 6\' 1"  (1.854 m)     Head Circumference --      Peak Flow --      Pain Score 02/17/19 1252 3     Pain Loc --      Pain Edu? --      Excl. in [place]? --    Constitutional: Alert and oriented. Well appearing and in no distress. Eyes: Conjunctivae are normal. Normal extraocular movements. ENT      Head: Normocephalic and atraumatic.      Nose: No congestion/rhinnorhea.      Mouth/Throat: Mucous membranes are moist.      Neck: No stridor. Cardiovascular: Normal rate, regular rhythm. No murmurs, rubs, or gallops. Respiratory: Normal respiratory effort without tachypnea nor retractions. Breath sounds are clear and equal bilaterally. No wheezes/rales/rhonchi. Gastrointestinal: Soft and nontender. Normal bowel sounds Musculoskeletal: Nontender with normal range of motion in extremities. No lower extremity tenderness nor edema. Neurologic:  Normal speech and language. No gross focal neurologic deficits are appreciated.  Skin:  Skin is warm, dry and intact. No rash noted. Psychiatric: Mood and affect are normal. Speech and behavior are normal.  ____________________________________________  EKG: Interpreted by me.  Sinus rhythm with a rate of 100 bpm, left axis deviation, left  bundle branch block.  Long QT  ____________________________________________  ED COURSE:  As part of my medical decision making, I reviewed the following data within the [place] History obtained from family if available, nursing notes, old chart and ekg, as well as notes from prior ED visits. Patient presented for nausea, vomiting and shortness of breath, we will assess with labs and imaging as indicated at this time.   Procedures ____________________________________________   LABS (pertinent positives/negatives)  Labs Reviewed  BASIC METABOLIC PANEL - Abnormal; Notable for the following components:      Result Value   Glucose, Bld 149 (*)    BUN 24 (*)    Creatinine, Ser 2.06 (*)    GFR calc non Af Amer 36 (*)    GFR calc Af Amer 41 (*)    All other components within normal limits  CBC - Abnormal; Notable for the following components:   RDW 15.9 (*)    All other components within normal limits  TROPONIN I -  Abnormal; Notable for the following components:   Troponin I 0.04 (*)    All other components within normal limits  TROPONIN I - Abnormal; Notable for the following components:   Troponin I 0.07 (*)    All other components within normal limits    RADIOLOGY Images were viewed by me Chest x-ray IMPRESSION: Possible minimal edema seen in right lung base. Stable cardiomegaly.  ____________________________________________   DIFFERENTIAL DIAGNOSIS   Dehydration, electrolyte abnormality, gastroenteritis, arrhythmia  FINAL ASSESSMENT AND PLAN  Intractable vomiting, CHF, elevated troponin   Plan: The patient had presented for persistent vomiting after but recently being seen for same. Patient's labs did reveal slight worsening in his creatinine compared to prior but overall stable.  Troponin is chronically elevated but appears to be worsening.  Patient's imaging did not reveal any acute process.  Patient does not want to go home, I will discuss with  the hospitalist for observation.   Laurence Aly, MD    Note: This note was generated in part or whole with voice recognition software. Voice recognition is usually quite accurate but there are transcription errors that can and very often do occur. I apologize for any typographical errors that were not detected and corrected.     Earleen Newport, MD 02/17/19 1759    Earleen Newport, MD 02/17/19 [phone]

## 2019-02-17 NOTE — Telephone Encounter (Signed)
Thank you :)

## 2019-02-18 ENCOUNTER — Other Ambulatory Visit: Payer: Self-pay

## 2019-02-18 ENCOUNTER — Inpatient Hospital Stay: Payer: Commercial Managed Care - PPO

## 2019-02-18 LAB — TROPONIN I
TROPONIN I: 0.07 ng/mL — AB (ref <0.03)
Troponin I: 0.05 ng/mL (ref <0.03)

## 2019-02-18 LAB — CBC
HCT: 40.9 % (ref 39.0–52.0)
Hemoglobin: 13.4 g/dL (ref 13.0–17.0)
MCH: 28.3 pg (ref 26.0–34.0)
MCHC: 32.8 g/dL (ref 30.0–36.0)
MCV: 86.3 fL (ref 80.0–100.0)
NRBC: 0 % (ref 0.0–0.2)
PLATELETS: 188 10*3/uL (ref 150–400)
RBC: 4.74 MIL/uL (ref 4.22–5.81)
RDW: 15.8 % — ABNORMAL HIGH (ref 11.5–15.5)
WBC: 8.1 10*3/uL (ref 4.0–10.5)

## 2019-02-18 LAB — COMPREHENSIVE METABOLIC PANEL
ALT: 14 U/L (ref 0–44)
AST: 14 U/L — ABNORMAL LOW (ref 15–41)
Albumin: 3.7 g/dL (ref 3.5–5.0)
Alkaline Phosphatase: 77 U/L (ref 38–126)
Anion gap: 12 (ref 5–15)
BUN: 29 mg/dL — ABNORMAL HIGH (ref 6–20)
CO2: 25 mmol/L (ref 22–32)
Calcium: 9.3 mg/dL (ref 8.9–10.3)
Chloride: 106 mmol/L (ref 98–111)
Creatinine, Ser: 2.07 mg/dL — ABNORMAL HIGH (ref 0.61–1.24)
GFR calc non Af Amer: 35 mL/min — ABNORMAL LOW (ref >60)
GFR, EST AFRICAN AMERICAN: 41 mL/min — AB (ref >60)
Glucose, Bld: 92 mg/dL (ref 70–99)
Potassium: 3.2 mmol/L — ABNORMAL LOW (ref 3.5–5.1)
Sodium: 143 mmol/L (ref 135–145)
Total Bilirubin: 1 mg/dL (ref 0.3–1.2)
Total Protein: 6.8 g/dL (ref 6.5–8.1)

## 2019-02-18 LAB — MAGNESIUM: Magnesium: 1.9 mg/dL (ref 1.7–2.4)

## 2019-02-18 LAB — TSH: TSH: <0.01 u[IU]/mL — ABNORMAL LOW (ref 0.350–4.500)

## 2019-02-18 MED ORDER — AMIODARONE HCL 200 MG PO TABS: 200.0000 mg | ORAL_TABLET | Freq: Every day | ORAL | Status: DC

## 2019-02-18 MED ORDER — ATORVASTATIN CALCIUM 20 MG PO TABS
80.0000 mg | ORAL_TABLET | Freq: Every day | ORAL | Status: DC
  Administered 2019-02-18 – 2019-02-28 (×10): 80 mg via ORAL
  Filled 2019-02-18 (×10): qty 4

## 2019-02-18 MED ORDER — CLOPIDOGREL BISULFATE 75 MG PO TABS
75.0000 mg | ORAL_TABLET | Freq: Every day | ORAL | Status: DC
  Administered 2019-02-18 – 2019-03-01 (×12): 75 mg via ORAL
  Filled 2019-02-18 (×12): qty 1

## 2019-02-18 MED ORDER — CARVEDILOL 3.125 MG PO TABS
3.1250 mg | ORAL_TABLET | Freq: Two times a day (BID) | ORAL | Status: DC
  Administered 2019-02-18 – 2019-03-01 (×22): 3.125 mg via ORAL
  Filled 2019-02-18 (×23): qty 1

## 2019-02-18 MED ORDER — ONDANSETRON HCL 4 MG/2ML IJ SOLN
4.0000 mg | Freq: Four times a day (QID) | INTRAMUSCULAR | Status: DC | PRN
  Administered 2019-02-18 – 2019-02-26 (×8): 4 mg via INTRAVENOUS
  Filled 2019-02-18 (×10): qty 2

## 2019-02-18 MED ORDER — ENOXAPARIN SODIUM 40 MG/0.4ML ~~LOC~~ SOLN
40.0000 mg | SUBCUTANEOUS | Status: DC
  Administered 2019-02-18 – 2019-02-21 (×4): 40 mg via SUBCUTANEOUS
  Filled 2019-02-18 (×4): qty 0.4

## 2019-02-18 MED ORDER — ACETAMINOPHEN 650 MG RE SUPP: 650.0000 mg | Freq: Four times a day (QID) | RECTAL | Status: DC | PRN

## 2019-02-18 MED ORDER — ONDANSETRON HCL 4 MG PO TABS
4.0000 mg | ORAL_TABLET | Freq: Four times a day (QID) | ORAL | Status: DC | PRN
  Administered 2019-03-01: 4 mg via ORAL
  Filled 2019-02-18: qty 1

## 2019-02-18 MED ORDER — ENOXAPARIN SODIUM 40 MG/0.4ML ~~LOC~~ SOLN: 30.0000 mg | SUBCUTANEOUS | Status: DC

## 2019-02-18 MED ORDER — POTASSIUM CHLORIDE CRYS ER 20 MEQ PO TBCR
40.0000 meq | EXTENDED_RELEASE_TABLET | Freq: Once | ORAL | Status: AC
  Administered 2019-02-18: 40 meq via ORAL
  Filled 2019-02-18: qty 2

## 2019-02-18 MED ORDER — METHIMAZOLE 10 MG PO TABS
40.0000 mg | ORAL_TABLET | Freq: Every day | ORAL | Status: DC
  Filled 2019-02-18: qty 4

## 2019-02-18 MED ORDER — METHIMAZOLE 10 MG PO TABS
40.0000 mg | ORAL_TABLET | Freq: Every day | ORAL | Status: DC
  Administered 2019-02-19: 40 mg via ORAL
  Filled 2019-02-18 (×2): qty 4

## 2019-02-18 MED ORDER — ASPIRIN EC 81 MG PO TBEC
81.0000 mg | DELAYED_RELEASE_TABLET | Freq: Every day | ORAL | Status: DC
  Administered 2019-02-18 – 2019-03-01 (×12): 81 mg via ORAL
  Filled 2019-02-18 (×12): qty 1

## 2019-02-18 MED ORDER — TRAMADOL HCL 50 MG PO TABS
50.0000 mg | ORAL_TABLET | Freq: Four times a day (QID) | ORAL | Status: DC | PRN
  Administered 2019-02-20: 50 mg via ORAL
  Filled 2019-02-18: qty 1

## 2019-02-18 MED ORDER — SODIUM CHLORIDE 0.9 % IV SOLN
INTRAVENOUS | Status: DC
  Administered 2019-02-18 – 2019-02-21 (×5): via INTRAVENOUS

## 2019-02-18 MED ORDER — MOMETASONE FURO-FORMOTEROL FUM 200-5 MCG/ACT IN AERO
2.0000 | INHALATION_SPRAY | Freq: Two times a day (BID) | RESPIRATORY_TRACT | Status: DC
  Administered 2019-02-18 – 2019-03-01 (×20): 2 via RESPIRATORY_TRACT
  Filled 2019-02-18 (×2): qty 8.8

## 2019-02-18 MED ORDER — ACETAMINOPHEN 325 MG PO TABS
650.0000 mg | ORAL_TABLET | Freq: Four times a day (QID) | ORAL | Status: DC | PRN
  Administered 2019-02-18: 650 mg via ORAL
  Filled 2019-02-18: qty 2

## 2019-02-18 MED ORDER — AMIODARONE HCL 200 MG PO TABS
200.0000 mg | ORAL_TABLET | Freq: Every day | ORAL | Status: DC
  Administered 2019-02-18 – 2019-03-01 (×12): 200 mg via ORAL
  Filled 2019-02-18 (×12): qty 1

## 2019-02-18 MED ORDER — TIOTROPIUM BROMIDE MONOHYDRATE 18 MCG IN CAPS
1.0000 | ORAL_CAPSULE | Freq: Every day | RESPIRATORY_TRACT | Status: DC
  Administered 2019-02-18 – 2019-03-01 (×11): 18 ug via RESPIRATORY_TRACT
  Filled 2019-02-18 (×3): qty 5

## 2019-02-18 NOTE — Progress Notes (Addendum)
[place] [facility] [place] NAME: Stephen [name]    MR#:  [id number]  DATE OF BIRTH:  [birth year]  SUBJECTIVE:   Feeling a little bit better this morning.  He continues having nausea and vomiting.  He also endorses diarrhea and LLQ abdominal pain.  REVIEW OF SYSTEMS:  Review of Systems  Constitutional: Negative for chills and fever.  HENT: Negative for congestion and sore throat.   Eyes: Negative for blurred vision and double vision.  Respiratory: Negative for cough and shortness of breath.   Cardiovascular: Negative for chest pain and palpitations.  Gastrointestinal: Positive for abdominal pain, diarrhea, nausea and vomiting.  Genitourinary: Negative for dysuria and urgency.  Musculoskeletal: Negative for back pain and neck pain.  Neurological: Negative for dizziness and headaches.  Psychiatric/Behavioral: Negative for depression. The patient is not nervous/anxious.     DRUG ALLERGIES:   Allergies  Allergen Reactions  . Lisinopril Cough   VITALS:  Blood pressure 101/84, pulse 76, temperature 98.5 F (36.9 C), temperature source Oral, resp. rate 17, height 6\' 1"  (1.854 m), weight 102.1 kg, SpO2 94 %. PHYSICAL EXAMINATION:  Physical Exam  GENERAL:  53 y.o.-year-old patient lying in the bed with no acute distress.  EYES: Pupils equal, round, reactive to light and accommodation. No scleral icterus. Extraocular muscles intact.  HEENT: Head atraumatic, normocephalic. Oropharynx and nasopharynx clear.    Moist mucous membranes NECK:  Supple, no jugular venous distention. No thyroid enlargement, no tenderness.  LUNGS: Normal breath sounds bilaterally, no wheezing, rales,rhonchi or crepitation. No use of accessory muscles of respiration.  CARDIOVASCULAR: RRR, S1, S2 normal. No murmurs, rubs, or gallops.  ABDOMEN: Soft, +LLQ tenderness to palpation, no rebound, no guarding. + Mild distention. bowel sounds present. No organomegaly or mass.    EXTREMITIES: No pedal edema, cyanosis, or clubbing.  NEUROLOGIC: CN 2-12 grossly normal, no focal deficits. sensation intact. Gait not checked.  PSYCHIATRIC: The patient is alert and oriented x 3.  SKIN: No obvious rash, lesion, or ulcer.  LABORATORY PANEL:  Male CBC Recent Labs  Lab 02/18/19 0458  WBC 8.1  HGB 13.4  HCT 40.9  PLT 188   ------------------------------------------------------------------------------------------------------------------ Chemistries  Recent Labs  Lab 02/18/19 0458  NA 143  K 3.2*  CL 106  CO2 25  GLUCOSE 92  BUN 29*  CREATININE 2.07*  CALCIUM 9.3  MG 1.9  AST 14*  ALT 14  ALKPHOS 77  BILITOT 1.0   RADIOLOGY:  No results found. ASSESSMENT AND PLAN:   Acute kidney injury on chronic kidney disease- likely due to nausea, vomiting, diarrhea.  Creatinine unchanged from yesterday. -Continue gentle IV fluids -Holding Trester, torsemide, metolazone -Avoid nephrotoxins -Check renal ultrasound  Nausea/vomiting/diarrhea- likely viral gastroenteritis. -Clear liquid diet and will advance as tolerated -Gentle IV fluids -IV antiemetics -Check GI panel -Check abdominal x-ray, as he has some abdominal distention on exam  Elevated troponin- likely demand ischemia in the setting of AKI.  Troponins only mildly elevated and flat.  No active chest pain. 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 Management plans discussed with the patient, family and they are in agreement.  CODE STATUS:  Full Code  TOTAL  TIME TAKING CARE OF THIS PATIENT: 40 minutes.   More than 50% of the time was spent in counseling/coordination of care: YES  POSSIBLE D/C IN 2-3 DAYS, DEPENDING ON CLINICAL CONDITION.   Berna Spare Adreanna Fickel M.D on 02/18/2019 at 2:55 PM  Between 7am to 6pm - Pager - [phone]  After 6pm go to [email or web address] - Proofreader  [facility]  Office  [phone]  CC: Primary care physician; Valerie Roys, DO  Note: This dictation was prepared with Dragon dictation along with smaller phrase technology. Any transcriptional errors that result from this process are unintentional.

## 2019-02-18 NOTE — ED Notes (Signed)
Pt up to restroom.

## 2019-02-18 NOTE — Progress Notes (Signed)
Anticoagulation monitoring(Lovenox):  52yo  M ordered Lovenox 30 mg Q24h  Filed Weights   02/17/19 1252  Weight: 225 lb (102.1 kg)   BMI 29   Lab Results  Component Value Date   CREATININE 2.07 (H) 02/18/2019   CREATININE 2.06 (H) 02/17/2019   CREATININE 1.61 (H) 02/16/2019   Estimated Creatinine Clearance: 51.8 mL/min (A) (by C-G formula based on SCr of 2.07 mg/dL (H)). Hemoglobin & Hematocrit     Component Value Date/Time   HGB 13.4 02/18/2019 0458   HGB 13.9 08/25/2018 1639   HCT 40.9 02/18/2019 0458   HCT 41.9 08/25/2018 1639     Per Protocol for Patient with estCrcl > 30 ml/min and BMI < 40, will transition to Lovenox 40 mg Q24h      Chinita Greenland PharmD Clinical Pharmacist 02/18/2019

## 2019-02-18 NOTE — ED Notes (Addendum)
Per pt, "when I stood up to use the bathroom I felt nauseous, dizzy and short of breath". Pt denies chest pain at this time.

## 2019-02-18 NOTE — ED Notes (Signed)
ED TO INPATIENT HANDOFF REPORT  Name/Age/Gender Stephen [name] 53 y.o. male  Code Status    Code Status Orders  (From admission, onward)         Start     Ordered   02/18/19 0022  Full code  Continuous     02/18/19 0021        Code Status History    Date Active Date Inactive Code Status Order ID Comments User Context   02/13/2019 1451 02/14/2019 2116 Full Code [id number]  Hillary Bow, MD ED   12/27/2018 0600 12/28/2018 1829 Partial Code [id number]  Harrie Foreman, MD Inpatient   12/04/2018 0437 12/10/2018 2037 Partial Code [id number]  Bradly Bienenstock, NP Inpatient   12/04/2018 0147 12/04/2018 0437 Full Code [id number]  Henreitta Leber, MD ED   08/19/2018 2248 08/20/2018 2258 Full Code [id number]  Amelia Jo, MD Inpatient   06/30/2018 1757 07/06/2018 1647 Full Code [id number]  [name], MD Inpatient   06/10/2018 1606 06/11/2018 1537 Full Code [id number]  Thompson Grayer, MD Inpatient   12/23/2017 1153 12/31/2017 1729 Full Code [id number]  Erlene Quan, PA-C Inpatient   12/20/2017 1851 12/23/2017 1110 Full Code [id number]  [name], MD Inpatient   09/08/2017 0644 09/10/2017 2127 Full Code [id number]  Harrie Foreman, MD Inpatient   07/29/2017 1044 07/31/2017 2152 Full Code [id number]  Henreitta Leber, MD Inpatient   06/19/2017 0027 06/20/2017 2114 Full Code [id number]  Lance Coon, MD ED   05/20/2017 1226 05/21/2017 1759 Full Code [id number]  [name], MD Inpatient   06/22/2016 0430 06/22/2016 2150 Full Code [id number]  Quintella Baton, MD Inpatient   06/04/2016 1843 06/06/2016 1815 Full Code [id number]  [name], MD Inpatient      Home/SNF/Other Home  Chief Complaint vomiting;sweats  Level of Care/Admitting Diagnosis ED Disposition    ED Disposition Condition [place]: [place] [100120]  Level of Care: Telemetry [5]  Diagnosis: AKI (acute kidney injury) [facility]) [916945]  Admitting Physician: [name] [[id number]]  Attending Physician: [name] [[id number]]  Estimated length of stay: past midnight tomorrow  Certification:: I certify this patient will need inpatient services for at least 2 midnights  Bed request comments: 2a  PT Class (Do Not Modify): Inpatient [101]  PT Acc Code (Do Not Modify): Private [1]       Medical History Past Medical History:  Diagnosis Date  . AICD (automatic cardioverter/defibrillator) present   . AKI (acute kidney injury) ([place]) 12/24/2017  . Arrhythmia   . Arthritis    "lower back, knees" (06/10/2018)  . Cardiac arrest ([place]) 12/23/2017   Brief V-fib arrest  . Chest pain 09/08/2017  . CHF (congestive heart failure) (H[facility])    nonischemic cardiomyopathy, EF 25%  . Gout    "on daily RX" (06/10/2018)  . Headache    "q couple months" (06/10/2018)  . High cholesterol   . History of kidney stones   . Hypertension   . Influenza A 12/24/2017  . NICM (nonischemic cardiomyopathy) ([place])   . Pneumonia 12/20/2017  . TIA (transient ischemic attack) 06/21/2016   "still affects my memory a little bit" (06/10/2018)    Allergies Allergies  Allergen Reactions  . Lisinopril Cough    IV Location/Drains/Wounds Patient Lines/Drains/Airways Status   Active Line/Drains/Airways    Name:   Placement date:   Placement time:   Site:   Days:   Peripheral IV 02/17/19 Left Wrist   02/17/19    1844  Wrist   1          Labs/Imaging Results for orders placed or performed during the hospital encounter of 02/17/19 (from the past 48 hour(s))  Basic metabolic panel     Status: Abnormal   Collection Time: 02/17/19 12:54 PM  Result Value Ref Range   Sodium 140 135 - 145 mmol/L   Potassium 3.6 3.5 - 5.1 mmol/L   Chloride 105 98 - 111 mmol/L   CO2 22 22 - 32 mmol/L   Glucose, Bld 149 (H) 70 - 99 mg/dL   BUN 24 (H) 6 - 20 mg/dL   Creatinine, Ser 2.06 (H) 0.61 - 1.24 mg/dL   Calcium 9.8 8.9 - 10.3 mg/dL   GFR calc non Af Amer 36 (L) >60 mL/min   GFR calc Af Amer 41 (L) >60 mL/min    Anion gap 13 5 - 15    Comment: Performed at [facility], [address] [place], [place] [address]  CBC     Status: Abnormal   Collection Time: 02/17/19 12:54 PM  Result Value Ref Range   WBC 8.1 4.0 - 10.5 K/uL   RBC 5.00 4.22 - 5.81 MIL/uL   Hemoglobin 14.1 13.0 - 17.0 g/dL   HCT 43.2 39.0 - 52.0 %   MCV 86.4 80.0 - 100.0 fL   MCH 28.2 26.0 - 34.0 pg   MCHC 32.6 30.0 - 36.0 g/dL   RDW 15.9 (H) 11.5 - 15.5 %   Platelets 216 150 - 400 K/uL   nRBC 0.0 0.0 - 0.2 %    Comment: Performed at [facility], [address] [place], [place] [address]  Troponin I - ONCE - STAT     Status: Abnormal   Collection Time: 02/17/19 12:54 PM  Result Value Ref Range   Troponin I 0.04 (HH) <0.03 ng/mL    Comment: CRITICAL VALUE NOTED. VALUE IS CONSISTENT WITH PREVIOUSLY REPORTED/CALLED VALUE...[facility] Performed at [facility], [address] [place], [place] [address]   Troponin I - ONCE - STAT     Status: Abnormal   Collection Time: 02/17/19  6:44 PM  Result Value Ref Range   Troponin I 0.07 (HH) <0.03 ng/mL    Comment: CRITICAL RESULT CALLED TO, READ BACK BY AND VERIFIED WITH ALLY BOWMAN @1919  02/17/19 AKT Performed at [facility], [address] [place], [place] [address]   Troponin I - Now Then Q6H     Status: Abnormal   Collection Time: 02/17/19  9:32 PM  Result Value Ref Range   Troponin I 0.08 (HH) <0.03 ng/mL    Comment: CRITICAL VALUE NOTED. VALUE IS CONSISTENT WITH PREVIOUSLY REPORTED/CALLED VALUE SMA Performed at [facility], [address] [place], [place] [address]   Troponin I - Now Then Q6H     Status: Abnormal   Collection Time: 02/18/19  4:58 AM  Result Value Ref Range   Troponin I 0.07 (HH) <0.03 ng/mL    Comment: CRITICAL VALUE NOTED. VALUE IS CONSISTENT WITH PREVIOUSLY REPORTED/CALLED VALUE SMA Performed at [facility], [address] [place], [place] [address]   TSH     Status: Abnormal   Collection Time:  02/18/19  4:58 AM  Result Value Ref Range   TSH <0.010 (L) 0.350 - 4.500 uIU/mL    Comment: Performed by a 3rd Generation assay with a functional sensitivity of <=0.01 uIU/mL. Performed at [facility], [address] [place], [place] [address]   Magnesium  Status: None   Collection Time: 02/18/19  4:58 AM  Result Value Ref Range   Magnesium 1.9 1.7 - 2.4 mg/dL    Comment: Performed at [facility], [address] [place], [place] [address]  Comprehensive metabolic panel     Status: Abnormal   Collection Time: 02/18/19  4:58 AM  Result Value Ref Range   Sodium 143 135 - 145 mmol/L   Potassium 3.2 (L) 3.5 - 5.1 mmol/L   Chloride 106 98 - 111 mmol/L   CO2 25 22 - 32 mmol/L   Glucose, Bld 92 70 - 99 mg/dL   BUN 29 (H) 6 - 20 mg/dL   Creatinine, Ser 2.07 (H) 0.61 - 1.24 mg/dL   Calcium 9.3 8.9 - 10.3 mg/dL   Total Protein 6.8 6.5 - 8.1 g/dL   Albumin 3.7 3.5 - 5.0 g/dL   AST 14 (L) 15 - 41 U/L   ALT 14 0 - 44 U/L   Alkaline Phosphatase 77 38 - 126 U/L   Total Bilirubin 1.0 0.3 - 1.2 mg/dL   GFR calc non Af Amer 35 (L) >60 mL/min   GFR calc Af Amer 41 (L) >60 mL/min   Anion gap 12 5 - 15    Comment: Performed at [facility], [address] [place], [place] [address]  CBC     Status: Abnormal   Collection Time: 02/18/19  4:58 AM  Result Value Ref Range   WBC 8.1 4.0 - 10.5 K/uL   RBC 4.74 4.22 - 5.81 MIL/uL   Hemoglobin 13.4 13.0 - 17.0 g/dL   HCT 40.9 39.0 - 52.0 %   MCV 86.3 80.0 - 100.0 fL   MCH 28.3 26.0 - 34.0 pg   MCHC 32.8 30.0 - 36.0 g/dL   RDW 15.8 (H) 11.5 - 15.5 %   Platelets 188 150 - 400 K/uL   nRBC 0.0 0.0 - 0.2 %    Comment: Performed at [facility], [address] [place], [place] [address]  Troponin I - Now Then Q6H     Status: Abnormal   Collection Time: 02/18/19  8:08 AM  Result Value Ref Range   Troponin I 0.05 (HH) <0.03 ng/mL    Comment: CRITICAL VALUE NOTED. VALUE IS CONSISTENT WITH PREVIOUSLY  REPORTED/CALLED VALUE JJB Performed at [facility], [address] [place], [place] [address]    Dg Chest 2 View  Result Date: 02/17/2019 CLINICAL DATA:  Shortness of breath. EXAM: CHEST - 2 VIEW COMPARISON:  Radiographs of February 16, 2019. FINDINGS: Stable cardiomegaly. Single lead left-sided pacemaker is unchanged in position. Minimal Kerley B lines are noted laterally in right lung base which may represent minimal edema. Left lung is unremarkable. No pneumothorax or pleural effusion is noted. Bony thorax is unremarkable. IMPRESSION: Possible minimal edema seen in right lung base. Stable cardiomegaly. Electronically Signed   By: Marijo Conception, M.D.   On: 02/17/2019 13:47    Pending Labs Unresulted Labs (From admission, onward)    Start     Ordered   02/24/19 0500  Creatinine, serum  (enoxaparin (LOVENOX)    CrCl < 30 ml/min)  Weekly,   STAT    Comments:  while on enoxaparin therapy.    02/18/19 0021          Vitals/Pain Today's Vitals   02/18/19 1130 02/18/19 1200 02/18/19 1207 02/18/19 1330  BP: 116/90 101/81  101/84  Pulse: 87 76  76  Resp: (!) 26 18  17  Temp:      TempSrc:      SpO2: 96% 93%  94%  Weight:      Height:      PainSc:   4  0-No pain    Isolation Precautions No active isolations  Medications Medications  sodium chloride flush (NS) 0.9 % injection 3 mL (3 mLs Intravenous Not Given 02/17/19 1844)  0.9 %  sodium chloride infusion ( Intravenous New Bag/Given 02/18/19 0817)  traMADol (ULTRAM) tablet 50 mg (has no administration in time range)  acetaminophen (TYLENOL) tablet 650 mg (650 mg Oral Given 02/18/19 1207)    Or  acetaminophen (TYLENOL) suppository 650 mg ( Rectal See Alternative 02/18/19 1207)  ondansetron (ZOFRAN) tablet 4 mg ( Oral See Alternative 02/18/19 1130)    Or  ondansetron (ZOFRAN) injection 4 mg (4 mg Intravenous Given 02/18/19 1130)  pantoprazole (PROTONIX) injection 40 mg (40 mg Intravenous Given 02/17/19 2225)  enoxaparin  (LOVENOX) injection 40 mg (40 mg Subcutaneous Given 02/18/19 1130)  sodium chloride 0.9 % bolus 250 mL (0 mLs Intravenous Stopped 02/17/19 2042)  promethazine (PHENERGAN) injection 25 mg (25 mg Intravenous Given 02/17/19 1901)  potassium chloride SA (K-DUR,KLOR-CON) CR tablet 40 mEq (40 mEq Oral Given 02/18/19 1115)    Mobility walks

## 2019-02-18 NOTE — ED Notes (Signed)
Pt is resting in bed. Nadn.

## 2019-02-18 NOTE — ED Notes (Signed)
Breakfast tray given to pt. Pt denies pain at this time. Pt is resting. This RN will continue to monitor.

## 2019-02-18 NOTE — ED Notes (Signed)
Pt c/o of headache.

## 2019-02-18 NOTE — ED Notes (Signed)
Pt was placed on 2L [place]. Pt desat 88%. Pt is currently 97% SpO2

## 2019-02-19 LAB — GASTROINTESTINAL PANEL BY PCR, STOOL (REPLACES STOOL CULTURE)

## 2019-02-19 LAB — CBC
HCT: 39 % (ref 39.0–52.0)
Hemoglobin: 12.4 g/dL — ABNORMAL LOW (ref 13.0–17.0)
MCH: 28.2 pg (ref 26.0–34.0)
MCHC: 31.8 g/dL (ref 30.0–36.0)
MCV: 88.8 fL (ref 80.0–100.0)
Platelets: 183 10*3/uL (ref 150–400)
RBC: 4.39 MIL/uL (ref 4.22–5.81)
RDW: 15.9 % — ABNORMAL HIGH (ref 11.5–15.5)
WBC: 8.1 10*3/uL (ref 4.0–10.5)
nRBC: 0 % (ref 0.0–0.2)

## 2019-02-19 LAB — BASIC METABOLIC PANEL
Anion gap: 9 (ref 5–15)
BUN: 30 mg/dL — ABNORMAL HIGH (ref 6–20)
CO2: 24 mmol/L (ref 22–32)
Calcium: 8.8 mg/dL — ABNORMAL LOW (ref 8.9–10.3)
Chloride: 105 mmol/L (ref 98–111)
Creatinine, Ser: 2.22 mg/dL — ABNORMAL HIGH (ref 0.61–1.24)
GFR calc Af Amer: 38 mL/min — ABNORMAL LOW (ref >60)
GFR calc non Af Amer: 33 mL/min — ABNORMAL LOW (ref >60)
Glucose, Bld: 118 mg/dL — ABNORMAL HIGH (ref 70–99)
Potassium: 3.8 mmol/L (ref 3.5–5.1)
SODIUM: 138 mmol/L (ref 135–145)

## 2019-02-19 LAB — T4, FREE: FREE T4: 2.36 ng/dL — AB (ref 0.82–1.77)

## 2019-02-19 MED ORDER — OXYCODONE-ACETAMINOPHEN 5-325 MG PO TABS
1.0000 | ORAL_TABLET | Freq: Once | ORAL | Status: AC
  Administered 2019-02-19: 1 via ORAL
  Filled 2019-02-19: qty 1

## 2019-02-19 MED ORDER — PANTOPRAZOLE SODIUM 40 MG PO TBEC
40.0000 mg | DELAYED_RELEASE_TABLET | Freq: Every day | ORAL | Status: DC
  Administered 2019-02-19 – 2019-02-27 (×6): 40 mg via ORAL
  Filled 2019-02-19 (×9): qty 1

## 2019-02-19 MED ORDER — COLCHICINE 0.6 MG PO TABS
0.6000 mg | ORAL_TABLET | Freq: Once | ORAL | Status: AC
  Administered 2019-02-19: 0.6 mg via ORAL
  Filled 2019-02-19: qty 1

## 2019-02-19 MED ORDER — METHIMAZOLE 10 MG PO TABS
50.0000 mg | ORAL_TABLET | Freq: Every day | ORAL | Status: DC
  Administered 2019-02-20 – 2019-03-01 (×4): 50 mg via ORAL
  Filled 2019-02-19 (×10): qty 5

## 2019-02-19 MED ORDER — COLCHICINE 0.6 MG PO TABS
1.2000 mg | ORAL_TABLET | Freq: Once | ORAL | Status: AC
  Administered 2019-02-19: 1.2 mg via ORAL
  Filled 2019-02-19: qty 2

## 2019-02-19 NOTE — Plan of Care (Signed)
  Problem: Health Behavior/Discharge Planning: Goal: Ability to manage health-related needs will improve Outcome: Not Progressing Note:  Patient has c/o nausea and a sore ankle d/t a suspected gout flare-up. Medicated with IV Zofran for the nausea just a little bit ago and given an emesis bag. Will continue to monitor pain / discomfort progression. Wenda Low [facility]

## 2019-02-19 NOTE — Plan of Care (Signed)
  Problem: Clinical Measurements: Goal: Ability to maintain clinical measurements within normal limits will improve Outcome: Progressing Goal: Respiratory complications will improve Outcome: Progressing   Problem: Activity: Goal: Risk for activity intolerance will decrease Outcome: Progressing   Problem: Pain Managment: Goal: General experience of comfort will improve Outcome: Progressing   Problem: Safety: Goal: Ability to remain free from injury will improve Outcome: Progressing   

## 2019-02-19 NOTE — Progress Notes (Addendum)
[place] at [place] NAME: Stephen [name]    MR#:  [id number]  DATE OF BIRTH:  [birth year]  SUBJECTIVE:   Patient states that he is feeling better today.  He says that his nausea, vomiting, diarrhea, and abdominal pain have all resolved.  He denies any fevers or chills.  REVIEW OF SYSTEMS:  Review of Systems  Constitutional: Negative for chills and fever.  HENT: Negative for congestion and sore throat.   Eyes: Negative for blurred vision and double vision.  Respiratory: Negative for cough and shortness of breath.   Cardiovascular: Negative for chest pain and palpitations.  Gastrointestinal: Negative for abdominal pain, diarrhea, nausea and vomiting.  Genitourinary: Negative for dysuria and urgency.  Musculoskeletal: Negative for back pain and neck pain.  Neurological: Negative for dizziness and headaches.  Psychiatric/Behavioral: Negative for depression. The patient is not nervous/anxious.     DRUG ALLERGIES:   Allergies  Allergen Reactions  . Lisinopril Cough   VITALS:  Blood pressure 106/83, pulse 78, temperature 98.4 F (36.9 C), temperature source Oral, resp. rate 19, height 6\' 1"  (1.854 m), weight 103.6 kg, SpO2 94 %. PHYSICAL EXAMINATION:  Physical Exam  GENERAL:  53 y.o.-year-old patient lying in the bed with no acute distress.  EYES: Pupils equal, round, reactive to light and accommodation. No scleral icterus. Extraocular muscles intact.  HEENT: Head atraumatic, normocephalic. Oropharynx and nasopharynx clear.    Moist mucous membranes NECK:  Supple, no jugular venous distention. No thyroid enlargement, no tenderness.  LUNGS: Normal breath sounds bilaterally, no wheezing, rales,rhonchi or crepitation. No use of accessory muscles of respiration.  CARDIOVASCULAR: RRR, S1, S2 normal. No murmurs, rubs, or gallops.  ABDOMEN: Soft, + Mild distention. bowel sounds present. No organomegaly or mass.  EXTREMITIES: No pedal edema,  cyanosis, or clubbing.  NEUROLOGIC: CN 2-12 grossly normal, no focal deficits. sensation intact. Gait not checked.  PSYCHIATRIC: The patient is alert and oriented x 3.  SKIN: No obvious rash, lesion, or ulcer.  LABORATORY PANEL:  Male CBC Recent Labs  Lab 02/19/19 0407  WBC 8.1  HGB 12.4*  HCT 39.0  PLT 183   ------------------------------------------------------------------------------------------------------------------ Chemistries  Recent Labs  Lab 02/18/19 0458 02/19/19 0407  NA 143 138  K 3.2* 3.8  CL 106 105  CO2 25 24  GLUCOSE 92 118*  BUN 29* 30*  CREATININE 2.07* 2.22*  CALCIUM 9.3 8.8*  MG 1.9  --   AST 14*  --   ALT 14  --   ALKPHOS 77  --   BILITOT 1.0  --    RADIOLOGY:  No results found. ASSESSMENT AND PLAN:   Acute kidney injury on chronic kidney disease- likely due to nausea, vomiting, diarrhea.  Creatinine worsened from yesterday to today -Renal ultrasound with medical renal disease -Continue gentle IV fluids -Holding Trester, torsemide, metolazone -Avoid nephrotoxins -If kidney function worsens tomorrow, will obtain nephrology consult  Nausea/vomiting/diarrhea- resolved, likely viral gastroenteritis.  Abdominal x-ray unremarkable. -Clear liquid diet and will advance as tolerated -Gentle IV fluids -IV antiemetics -Unable to check GI pathogen panel due to lack of stool  Elevated troponin- likely demand ischemia in the setting of AKI.  Troponins only mildly elevated and flat.  No active chest pain. 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 -Increase methimazole to 50 mg daily -T3 pending -Needs to follow-up with endocrinology on  discharge  All the records are reviewed and case discussed with Care Management/Social Worker. Management plans discussed with the patient, family and they are in agreement.  CODE STATUS: Full Code  TOTAL TIME TAKING CARE OF THIS PATIENT: 35 minutes.   More than 50% of the time was spent in counseling/coordination of care: YES  POSSIBLE D/C IN 1-2 DAYS, DEPENDING ON CLINICAL CONDITION.   Berna Spare Mayo M.D on 02/19/2019 at 6:56 PM  Between 7am to 6pm - Pager [phone]  After 6pm go to [email or web address] - Proofreader  [facility]  Office  [phone]  CC: Primary care physician; Valerie Roys, DO  Note: This dictation was prepared with Dragon dictation along with smaller phrase technology. Any transcriptional errors that result from this process are unintentional.

## 2019-02-20 ENCOUNTER — Inpatient Hospital Stay: Payer: Commercial Managed Care - PPO

## 2019-02-20 LAB — CBC
HCT: 40.5 % (ref 39.0–52.0)
Hemoglobin: 12.9 g/dL — ABNORMAL LOW (ref 13.0–17.0)
MCH: 28.2 pg (ref 26.0–34.0)
MCHC: 31.9 g/dL (ref 30.0–36.0)
MCV: 88.6 fL (ref 80.0–100.0)
Platelets: 186 10*3/uL (ref 150–400)
RBC: 4.57 MIL/uL (ref 4.22–5.81)
RDW: 16.1 % — ABNORMAL HIGH (ref 11.5–15.5)
WBC: 8.7 10*3/uL (ref 4.0–10.5)
nRBC: 0 % (ref 0.0–0.2)

## 2019-02-20 LAB — BASIC METABOLIC PANEL
Anion gap: 8 (ref 5–15)
BUN: 34 mg/dL — ABNORMAL HIGH (ref 6–20)
CO2: 24 mmol/L (ref 22–32)
Calcium: 9 mg/dL (ref 8.9–10.3)
Chloride: 106 mmol/L (ref 98–111)
Creatinine, Ser: 2.23 mg/dL — ABNORMAL HIGH (ref 0.61–1.24)
GFR calc non Af Amer: 32 mL/min — ABNORMAL LOW (ref >60)
GFR, EST AFRICAN AMERICAN: 38 mL/min — AB (ref >60)
Glucose, Bld: 113 mg/dL — ABNORMAL HIGH (ref 70–99)
Potassium: 4 mmol/L (ref 3.5–5.1)
Sodium: 138 mmol/L (ref 135–145)

## 2019-02-20 LAB — T3: T3, Total: 80 ng/dL (ref 71–180)

## 2019-02-20 MED ORDER — COLCHICINE 0.6 MG PO TABS
0.6000 mg | ORAL_TABLET | Freq: Every day | ORAL | Status: DC
  Administered 2019-02-20 – 2019-03-01 (×10): 0.6 mg via ORAL
  Filled 2019-02-20 (×10): qty 1

## 2019-02-20 MED ORDER — DICLOFENAC SODIUM 1 % TD GEL
4.0000 g | Freq: Four times a day (QID) | TRANSDERMAL | Status: DC
  Administered 2019-02-20 – 2019-02-27 (×26): 4 g via TOPICAL
  Filled 2019-02-20: qty 100

## 2019-02-20 NOTE — Plan of Care (Signed)
  Problem: Clinical Measurements: Goal: Ability to maintain clinical measurements within normal limits will improve Outcome: Not Progressing Note:  Patient's creatinine is elevated again today at about 2.2. N.S. fluids are running at 50 cc / hr. Discussed with Dr. Brett Albino earlier in shift, will continue to administer. Will continue to monitor renal function. Wenda Low [facility]

## 2019-02-20 NOTE — Progress Notes (Addendum)
[facility] at [place] NAME: Stephen [name]    MR#:  [id number]  DATE OF BIRTH:  July 04, 1966  SUBJECTIVE:   Patient states he had multiple episodes of vomiting last night, probably 12-13 episodes. He had some "abdominal soreness from vomiting", but no abdominal pain. No diarrhea. Had a normal BM today. Nausea and vomiting have completely resolved this morning.  REVIEW OF SYSTEMS:  Review of Systems  Constitutional: Negative for chills and fever.  HENT: Negative for congestion and sore throat.   Eyes: Negative for blurred vision and double vision.  Respiratory: Negative for cough and shortness of breath.   Cardiovascular: Negative for chest pain and palpitations.  Gastrointestinal: Negative for abdominal pain, diarrhea, nausea and vomiting.  Genitourinary: Negative for dysuria and urgency.  Musculoskeletal: Negative for back pain and neck pain.  Neurological: Negative for dizziness and headaches.  Psychiatric/Behavioral: Negative for depression. The patient is not nervous/anxious.     DRUG ALLERGIES:   Allergies  Allergen Reactions  . Lisinopril Cough   VITALS:  Blood pressure 109/88, pulse 76, temperature 99.3 F (37.4 C), temperature source Oral, resp. rate 18, height 6\' 1"  (1.854 m), weight 105.3 kg, SpO2 97 %. PHYSICAL EXAMINATION:  Physical Exam  GENERAL:  53 y.o.-year-old patient lying in the bed with no acute distress.  EYES: Pupils equal, round, reactive to light and accommodation. No scleral icterus. Extraocular muscles intact.  HEENT: Head atraumatic, normocephalic. Oropharynx and nasopharynx clear.    Moist mucous membranes NECK:  Supple, no jugular venous distention. No thyroid enlargement, no tenderness.  LUNGS: Normal breath sounds bilaterally, no wheezing, rales,rhonchi or crepitation. No use of accessory muscles of respiration.  CARDIOVASCULAR: RRR, S1, S2 normal. No murmurs, rubs, or gallops.  ABDOMEN: Soft, + Mild  distention, ?fluid wave. bowel sounds present. No organomegaly or mass.  EXTREMITIES: No pedal edema, cyanosis, or clubbing. +left ankle is tender to palpation without erythema or warmth. NEUROLOGIC: CN 2-12 grossly normal, no focal deficits. sensation intact. Gait not checked.  PSYCHIATRIC: The patient is alert and oriented x 3.  SKIN: No obvious rash, lesion, or ulcer.  LABORATORY PANEL:  Male CBC Recent Labs  Lab 02/20/19 0634  WBC 8.7  HGB 12.9*  HCT 40.5  PLT 186   ------------------------------------------------------------------------------------------------------------------ Chemistries  Recent Labs  Lab 02/18/19 0458  02/20/19 0634  NA 143   < > 138  K 3.2*   < > 4.0  CL 106   < > 106  CO2 25   < > 24  GLUCOSE 92   < > 113*  BUN 29*   < > 34*  CREATININE 2.07*   < > 2.23*  CALCIUM 9.3   < > 9.0  MG 1.9  --   --   AST 14*  --   --   ALT 14  --   --   ALKPHOS 77  --   --   BILITOT 1.0  --   --    < > = values in this interval not displayed.   RADIOLOGY:  US Abdomen Limited  Result Date: 02/20/2019 CLINICAL DATA:  Abdomen distension.  Evaluate for ascites EXAM: LIMITED ABDOMEN ULTRASOUND FOR ASCITES TECHNIQUE: Limited ultrasound survey for ascites was performed in all four abdominal quadrants. COMPARISON:  None. FINDINGS: No evidence of ascites. IMPRESSION: Negative Electronically Signed   By: Kerby Moors M.D.   On: 02/20/2019 12:13   US Abdomen Limited Ruq  Result Date: 02/20/2019 CLINICAL DATA:  Right upper quadrant pain for 6 days. EXAM: ULTRASOUND ABDOMEN LIMITED RIGHT UPPER QUADRANT COMPARISON:  Body CT 04/01/2016 FINDINGS: Gallbladder: No gallstones or wall thickening visualized. No sonographic Murphy sign noted by sonographer. Common bile duct: Diameter: 2.3 mm Liver: No focal lesion identified. Mildly increased parenchymal echogenicity. Portal vein is patent on color Doppler imaging with normal direction of blood flow towards the liver. IMPRESSION: No  evidence of cholelithiasis or acute cholecystitis. Electronically Signed   By: Fidela Salisbury M.D.   On: 02/20/2019 12:12   ASSESSMENT AND PLAN:   Acute kidney injury on chronic kidney disease- likely due to nausea, vomiting, diarrhea.  Creatinine unchanged from yesterday. -Renal ultrasound with medical renal disease -Continue gentle IV fluids -Holding entresto, torsemide, metolazone -Avoid nephrotoxins  Nausea/vomiting/diarrhea- likely viral gastroenteritis.  Abdominal x-ray unremarkable.  He did have multiple episodes of vomiting last night, that has resolved this morning. -GI pathogen panel negative -Clear liquid diet and will advance as tolerated -Gentle IV fluids -IV antiemetics -Will check abdominal ultrasound, given questionable fluid wave on exam today  Left ankle pain- patient states that this feels like his regular gout.  No erythema, edema, or warmth on exam.  Patient has full range of motion. -Continue colchicine -Add heating pad and Voltaren gel, as patient states this usually helps him a lot. -Check left ankle x-ray  Elevated troponin- likely demand ischemia in the setting of AKI.  Troponins mildly elevated and flat.  No active chest pain. 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 -Increase methimazole to 50 mg daily -Needs to follow-up with endocrinology on discharge  All the records are reviewed and case discussed with Care Management/Social Worker. Management plans discussed with the patient, family and they are in agreement.  CODE STATUS: Full Code  TOTAL TIME TAKING  CARE OF THIS PATIENT: 35 minutes.   More than 50% of the time was spent in counseling/coordination of care: YES  POSSIBLE D/C IN 1-2 DAYS, DEPENDING ON CLINICAL CONDITION.   Berna Spare Glendola Friedhoff M.D on 02/20/2019 at 1:01 PM  Between 7am to 6pm - Pager - [phone]  After 6pm go to [email or web address] - Proofreader  [facility]  Office  [phone]  CC: Primary care physician; Valerie Roys, DO  Note: This dictation was prepared with Dragon dictation along with smaller phrase technology. Any transcriptional errors that result from this process are unintentional.

## 2019-02-20 NOTE — Care Management Note (Signed)
Case Management Note  Patient Details  Name: Stephen [name] MRN: [id number] Date of Birth: 1966-03-30  Subjective/Objective:   Patient is from home alone; independent in all ADL's.  Admitted with emesis.  Hx of CHF, BNP 1041 on admission.  Receiving gentle hydration now.  Had several episodes of vomiting overnight.  Patient has a functioning scale at home and weighs on a regular basis.  He is current with his PCP. Obtains medications at [facility] without difficulty.  He currently does not have insurance but states he will have insurance on March 1st through his employer.  No further needs identified at this time.            Action/Plan:   Expected Discharge Date:                  Expected Discharge Plan:  Home/Self Care  In-House Referral:     Discharge planning Services  CM Consult  Post Acute Care Choice:    Choice offered to:     DME Arranged:    DME Agency:     HH Arranged:    HH Agency:     Status of Service:  Completed, signed off  If discussed at [employer] of Stay Meetings, dates discussed:    Additional Comments:  Elza Rafter, RN 02/20/2019, 2:22 PM

## 2019-02-20 NOTE — Plan of Care (Signed)
  Problem: Activity: Goal: Risk for activity intolerance will decrease Outcome: Progressing   Problem: Pain Managment: Goal: General experience of comfort will improve Outcome: Progressing   Problem: Safety: Goal: Ability to remain free from injury will improve Outcome: Progressing   

## 2019-02-21 LAB — COMPREHENSIVE METABOLIC PANEL
ALT: 18 U/L (ref 0–44)
AST: 15 U/L (ref 15–41)
Albumin: 3.5 g/dL (ref 3.5–5.0)
Alkaline Phosphatase: 74 U/L (ref 38–126)
Anion gap: 10 (ref 5–15)
BUN: 35 mg/dL — ABNORMAL HIGH (ref 6–20)
CO2: 20 mmol/L — ABNORMAL LOW (ref 22–32)
Calcium: 9.3 mg/dL (ref 8.9–10.3)
Chloride: 108 mmol/L (ref 98–111)
Creatinine, Ser: 2.2 mg/dL — ABNORMAL HIGH (ref 0.61–1.24)
GFR calc Af Amer: 38 mL/min — ABNORMAL LOW (ref >60)
GFR calc non Af Amer: 33 mL/min — ABNORMAL LOW (ref >60)
Glucose, Bld: 113 mg/dL — ABNORMAL HIGH (ref 70–99)
Potassium: 4.1 mmol/L (ref 3.5–5.1)
Sodium: 138 mmol/L (ref 135–145)
Total Bilirubin: 1.3 mg/dL — ABNORMAL HIGH (ref 0.3–1.2)
Total Protein: 6.8 g/dL (ref 6.5–8.1)

## 2019-02-21 MED ORDER — ENOXAPARIN SODIUM 40 MG/0.4ML ~~LOC~~ SOLN
40.0000 mg | SUBCUTANEOUS | Status: DC
  Administered 2019-02-22 – 2019-02-27 (×6): 40 mg via SUBCUTANEOUS
  Filled 2019-02-21 (×7): qty 0.4

## 2019-02-21 MED ORDER — POLYETHYLENE GLYCOL 3350 17 G PO PACK
17.0000 g | PACK | Freq: Every day | ORAL | Status: DC
  Administered 2019-02-21: 17 g via ORAL
  Filled 2019-02-21 (×7): qty 1

## 2019-02-21 MED ORDER — BISACODYL 10 MG RE SUPP: 10.0000 mg | Freq: Every day | RECTAL | Status: DC | PRN

## 2019-02-21 MED ORDER — DOCUSATE SODIUM 100 MG PO CAPS
100.0000 mg | ORAL_CAPSULE | Freq: Two times a day (BID) | ORAL | Status: DC
  Administered 2019-02-21 – 2019-02-27 (×5): 100 mg via ORAL
  Filled 2019-02-21 (×12): qty 1

## 2019-02-21 NOTE — Progress Notes (Signed)
Anticoagulation monitoring(Lovenox):   53 yo  male ordered Lovenox 30 mg Q24h  Filed Weights   02/19/19 0513 02/20/19 0621 02/21/19 0409  Weight: 228 lb 4.8 oz (103.6 kg) 232 lb 1.6 oz (105.3 kg) 235 lb (106.6 kg)   BMI :    Lab Results  Component Value Date   CREATININE 2.20 (H) 02/21/2019   CREATININE 2.23 (H) 02/20/2019   CREATININE 2.22 (H) 02/19/2019   Estimated Creatinine Clearance: 49.8 mL/min (A) (by C-G formula based on SCr of 2.2 mg/dL (H)). Hemoglobin & Hematocrit     Component Value Date/Time   HGB 12.9 (L) 02/20/2019 0634   HGB 13.9 08/25/2018 1639   HCT 40.5 02/20/2019 0634   HCT 41.9 08/25/2018 1639     Per Protocol for Patient with estCrcl > 30 ml/min and BMI > 40, will transition to Lovenox 40 mg Q24h.

## 2019-02-21 NOTE — Progress Notes (Signed)
[facility] at [name] NAME: Stephen [name]    MR#:  [id number]  DATE OF BIRTH:  09-Dec-1966  SUBJECTIVE:   Patient reports nausea but denies any vomiting.  Denies any abdominal pain.  Short winded with exertion discontinued IV fluids  REVIEW OF SYSTEMS:  Review of Systems  Constitutional: Negative for chills and fever.  HENT: Negative for congestion and sore throat.   Eyes: Negative for blurred vision and double vision.  Respiratory: Negative for cough and shortness of breath.   Cardiovascular: Negative for chest pain and palpitations.  Gastrointestinal: Negative for abdominal pain, diarrhea, nausea and vomiting.  Genitourinary: Negative for dysuria and urgency.  Musculoskeletal: Negative for back pain and neck pain.  Neurological: Negative for dizziness and headaches.  Psychiatric/Behavioral: Negative for depression. The patient is not nervous/anxious.     DRUG ALLERGIES:   Allergies  Allergen Reactions  . Lisinopril Cough   VITALS:  Blood pressure 125/88, pulse 83, temperature 98.4 F (36.9 C), temperature source Oral, resp. rate 19, height 6\' 1"  (1.854 m), weight 106.6 kg, SpO2 (!) 88 %. PHYSICAL EXAMINATION:  Physical Exam  GENERAL:  53 y.o.-year-old patient lying in the bed with no acute distress.  EYES: Pupils equal, round, reactive to light and accommodation. No scleral icterus. Extraocular muscles intact.  HEENT: Head atraumatic, normocephalic. Oropharynx and nasopharynx clear.    Moist mucous membranes NECK:  Supple, no jugular venous distention. No thyroid enlargement, no tenderness.  LUNGS: Normal breath sounds bilaterally, no wheezing, rales,rhonchi or crepitation. No use of accessory muscles of respiration.  CARDIOVASCULAR: RRR, S1, S2 normal. No murmurs, rubs, or gallops.  ABDOMEN: Soft, + Mild distention, ?fluid wave. bowel sounds present. No organomegaly or mass.  EXTREMITIES: No pedal edema, cyanosis, or clubbing.  +left ankle is tender to palpation without erythema or warmth. NEUROLOGIC: CN 2-12 grossly normal, no focal deficits. sensation intact. Gait not checked.  PSYCHIATRIC: The patient is alert and oriented x 3.  SKIN: No obvious rash, lesion, or ulcer.  LABORATORY PANEL:  53 Male CBC Recent Labs  Lab 02/20/19 0634  WBC 8.7  HGB 12.9*  HCT 40.5  PLT 186   ------------------------------------------------------------------------------------------------------------------ Chemistries  Recent Labs  Lab 02/18/19 0458  02/21/19 0551  NA 143   < > 138  K 3.2*   < > 4.1  CL 106   < > 108  CO2 25   < > 20*  GLUCOSE 92   < > 113*  BUN 29*   < > 35*  CREATININE 2.07*   < > 2.20*  CALCIUM 9.3   < > 9.3  MG 1.9  --   --   AST 14*  --  15  ALT 14  --  18  ALKPHOS 77  --  74  BILITOT 1.0  --  1.3*   < > = values in this interval not displayed.   RADIOLOGY:  No results found. ASSESSMENT AND PLAN:   Acute kidney injury on chronic kidney disease- likely due to nausea, vomiting, diarrhea.   Creatinine unchanged from yesterday, may be 2.2 is his new baseline.  Previously it was at 1.6-2.0 -Renal ultrasound with medical renal disease -Hydrated with IV fluids and discontinue IV fluids as the patient is reporting exertional shortness of breath with ejection fraction 10 to 15% -Holding entresto, torsemide, metolazone -Avoid nephrotoxins  Nausea/vomiting/diarrhea- likely viral gastroenteritis.  Abdominal x-ray unremarkable.   Clinically improving. -GI pathogen panel negative -Carb modified diet -Gentle IV  fluids provided -IV antiemetics -X-ray and abdominal ultrasound with no acute findings  Left ankle pain- patient states that this feels like his regular gout.  No erythema, edema, or warmth on exam.  Patient has full range of motion. -Continue colchicine -Add heating pad and Voltaren gel, as patient states this usually helps him a lot. -Normal left ankle x-ray  Elevated troponin- likely  demand ischemia in the setting of AKI.  Troponins mildly elevated and flat.  No active chest pain. 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 -Increase methimazole to 50 mg daily -Needs to follow-up with endocrinology on discharge  All the records are reviewed and case discussed with Care Management/Social Worker. Management plans discussed with the patient, family and they are in agreement.  CODE STATUS: Full Code  TOTAL TIME TAKING CARE OF THIS PATIENT: 35 minutes.   More than 50% of the time was spent in counseling/coordination of care: YES  POSSIBLE D/C IN 1-2 DAYS, DEPENDING ON CLINICAL CONDITION.   [name] M.D on 02/21/2019 at 3:13 PM  Between 7am to 6pm - Pager - [phone] After 6pm go to [email or web address] - Proofreader  [facility] [place] [facility]  Office  [phone]  CC: Primary care physician; Valerie Roys, DO  Note: This dictation was prepared with Dragon dictation along with smaller phrase technology. Any transcriptional errors that result from this process are unintentional.

## 2019-02-21 NOTE — Plan of Care (Signed)
  Problem: Clinical Measurements: Goal: Will remain free from infection Outcome: Progressing Goal: Cardiovascular complication will be avoided Outcome: Progressing   Problem: Pain Managment: Goal: General experience of comfort will improve Outcome: Not Progressing Note:  Patient continues to have left ankle pain despite receiving interventions. Patient denying PRN pain medication.

## 2019-02-22 LAB — COMPREHENSIVE METABOLIC PANEL
ALT: 16 U/L (ref 0–44)
AST: 13 U/L — AB (ref 15–41)
Albumin: 3.2 g/dL — ABNORMAL LOW (ref 3.5–5.0)
Alkaline Phosphatase: 68 U/L (ref 38–126)
Anion gap: 8 (ref 5–15)
BUN: 37 mg/dL — ABNORMAL HIGH (ref 6–20)
CO2: 23 mmol/L (ref 22–32)
Calcium: 9 mg/dL (ref 8.9–10.3)
Chloride: 108 mmol/L (ref 98–111)
Creatinine, Ser: 2.32 mg/dL — ABNORMAL HIGH (ref 0.61–1.24)
GFR calc Af Amer: 36 mL/min — ABNORMAL LOW (ref >60)
GFR calc non Af Amer: 31 mL/min — ABNORMAL LOW (ref >60)
GLUCOSE: 97 mg/dL (ref 70–99)
Potassium: 3.9 mmol/L (ref 3.5–5.1)
SODIUM: 139 mmol/L (ref 135–145)
Total Bilirubin: 1.1 mg/dL (ref 0.3–1.2)
Total Protein: 6.3 g/dL — ABNORMAL LOW (ref 6.5–8.1)

## 2019-02-22 MED ORDER — PROMETHAZINE HCL 25 MG/ML IJ SOLN
12.5000 mg | Freq: Four times a day (QID) | INTRAMUSCULAR | Status: DC | PRN
  Administered 2019-02-22 (×2): 25 mg via INTRAVENOUS
  Administered 2019-02-22: 12.5 mg via INTRAVENOUS
  Administered 2019-02-23 – 2019-02-27 (×6): 25 mg via INTRAVENOUS
  Filled 2019-02-22 (×9): qty 1

## 2019-02-22 MED ORDER — TORSEMIDE 20 MG PO TABS
20.0000 mg | ORAL_TABLET | Freq: Once | ORAL | Status: AC
  Administered 2019-02-22: 20 mg via ORAL
  Filled 2019-02-22: qty 1

## 2019-02-22 NOTE — Progress Notes (Signed)
[place] at [place] NAME: Stephen [name]    MR#:  [id number]  DATE OF BIRTH:  Mar 01, 1966  SUBJECTIVE:   Patient reports intractable nausea and several episodes of vomiting  Not feeling better agreeable with GI consult recently did not have endoscopy done  REVIEW OF SYSTEMS:  Review of Systems  Constitutional: Negative for chills and fever.  HENT: Negative for congestion and sore throat.   Eyes: Negative for blurred vision and double vision.  Respiratory: Negative for cough and shortness of breath.   Cardiovascular: Negative for chest pain and palpitations.  Gastrointestinal: Negative for abdominal pain, diarrhea, nausea and vomiting.  Genitourinary: Negative for dysuria and urgency.  Musculoskeletal: Negative for back pain and neck pain.  Neurological: Negative for dizziness and headaches.  Psychiatric/Behavioral: Negative for depression. The patient is not nervous/anxious.     DRUG ALLERGIES:   Allergies  Allergen Reactions  . Lisinopril Cough   VITALS:  Blood pressure 124/89, pulse 77, temperature 98.4 F (36.9 C), temperature source Oral, resp. rate 19, height 6\' 1"  (1.854 m), weight 97.1 kg, SpO2 100 %. PHYSICAL EXAMINATION:  Physical Exam  GENERAL:  53 y.o.-year-old patient lying in the bed with no acute distress.  EYES: Pupils equal, round, reactive to light and accommodation. No scleral icterus. Extraocular muscles intact.  HEENT: Head atraumatic, normocephalic. Oropharynx and nasopharynx clear.    Moist mucous membranes NECK:  Supple, no jugular venous distention. No thyroid enlargement, no tenderness.  LUNGS: Normal breath sounds bilaterally, no wheezing, rales,rhonchi or crepitation. No use of accessory muscles of respiration.  CARDIOVASCULAR: RRR, S1, S2 normal. No murmurs, rubs, or gallops.  ABDOMEN: Soft, + Mild distention, ?fluid wave. bowel sounds present. No organomegaly or mass.  EXTREMITIES: No pedal edema,  cyanosis, or clubbing. +left ankle is tender to palpation without erythema or warmth. NEUROLOGIC: CN 2-12 grossly normal, no focal deficits. sensation intact. Gait not checked.  PSYCHIATRIC: The patient is alert and oriented x 3.  SKIN: No obvious rash, lesion, or ulcer.  LABORATORY PANEL:  Male CBC Recent Labs  Lab 02/20/19 0634  WBC 8.7  HGB 12.9*  HCT 40.5  PLT 186   ------------------------------------------------------------------------------------------------------------------ Chemistries  Recent Labs  Lab 02/18/19 0458  02/22/19 0354  NA 143   < > 139  K 3.2*   < > 3.9  CL 106   < > 108  CO2 25   < > 23  GLUCOSE 92   < > 97  BUN 29*   < > 37*  CREATININE 2.07*   < > 2.32*  CALCIUM 9.3   < > 9.0  MG 1.9  --   --   AST 14*   < > 13*  ALT 14   < > 16  ALKPHOS 77   < > 68  BILITOT 1.0   < > 1.1   < > = values in this interval not displayed.   RADIOLOGY:  No results found. ASSESSMENT AND PLAN:   Acute kidney injury on chronic kidney disease- likely due to nausea, vomiting, diarrhea.   Creatinine unchanged from yesterday, may be 2.2 is his new baseline.  Previously it was at 1.6-2.0 -Renal ultrasound with medical renal disease -Hydrated with IV fluids and discontinue IV fluids as the patient is reporting exertional shortness of breath with ejection fraction 10 to 15% -Holding entresto, torsemide, metolazone -Avoid nephrotoxins -Nephrology consult placed and with text message sent to Dr. Candiss Norse via haiku   Nausea/vomiting/diarrhea-clinically  not improving  abdominal x-ray unremarkable.   -Diarrhea resolved -GI consult placed and text message sent to Dr. Allen Norris via Raeanne Gathers -GI pathogen panel negative -Clear liquid diet -PPI -Gentle IV fluids provided, discontinued currently -IV antiemetics -X-ray and abdominal ultrasound with no acute findings   Left ankle pain- patient states that this feels like his regular gout.  No erythema, edema, or warmth on exam.   Patient has full range of motion. -Continue colchicine -Add heating pad and Voltaren gel, as patient states this usually helps him a lot. -Normal left ankle x-ray  Elevated troponin- likely demand ischemia in the setting of AKI.  Troponins mildly elevated and flat.  No active chest pain. 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 -Increase methimazole to 50 mg daily -Needs to follow-up with endocrinology on discharge  All the records are reviewed and case discussed with Care Management/Social Worker. Management plans discussed with the patient, family and they are in agreement.  CODE STATUS: Full Code  TOTAL TIME TAKING CARE OF THIS PATIENT: 35 minutes.   More than 50% of the time was spent in counseling/coordination of care: YES  POSSIBLE D/C IN 1-2 DAYS, DEPENDING ON CLINICAL CONDITION.   [name] M.D on 02/22/2019 at 3:35 PM  Between 7am to 6pm - Pager - [phone] After 6pm go to [email or web address] - Proofreader  [facility]  Office  [phone]  CC: Primary care physician; Valerie Roys, DO  Note: This dictation was prepared with Dragon dictation along with smaller phrase technology. Any transcriptional errors that result from this process are unintentional.

## 2019-02-22 NOTE — Plan of Care (Signed)
  Problem: Nutrition: Goal: Adequate nutrition will be maintained Outcome: Progressing   

## 2019-02-23 ENCOUNTER — Encounter: Payer: Self-pay | Admitting: *Deleted

## 2019-02-23 ENCOUNTER — Inpatient Hospital Stay: Payer: Commercial Managed Care - PPO

## 2019-02-23 DIAGNOSIS — R112 Nausea with vomiting, unspecified: Secondary | ICD-10-CM

## 2019-02-23 LAB — COMPREHENSIVE METABOLIC PANEL
ALT: 16 U/L (ref 0–44)
AST: 14 U/L — ABNORMAL LOW (ref 15–41)
Albumin: 3.3 g/dL — ABNORMAL LOW (ref 3.5–5.0)
Alkaline Phosphatase: 73 U/L (ref 38–126)
Anion gap: 12 (ref 5–15)
BUN: 43 mg/dL — ABNORMAL HIGH (ref 6–20)
CO2: 20 mmol/L — AB (ref 22–32)
Calcium: 9.2 mg/dL (ref 8.9–10.3)
Chloride: 107 mmol/L (ref 98–111)
Creatinine, Ser: 2.65 mg/dL — ABNORMAL HIGH (ref 0.61–1.24)
GFR calc Af Amer: 31 mL/min — ABNORMAL LOW (ref >60)
GFR calc non Af Amer: 26 mL/min — ABNORMAL LOW (ref >60)
Glucose, Bld: 102 mg/dL — ABNORMAL HIGH (ref 70–99)
Potassium: 4.1 mmol/L (ref 3.5–5.1)
SODIUM: 139 mmol/L (ref 135–145)
Total Bilirubin: 0.9 mg/dL (ref 0.3–1.2)
Total Protein: 6.6 g/dL (ref 6.5–8.1)

## 2019-02-23 LAB — PROTEIN / CREATININE RATIO, URINE
Creatinine, Urine: 220 mg/dL
Protein Creatinine Ratio: 0.19 mg/mg{Cre} — ABNORMAL HIGH (ref 0.00–0.15)
Total Protein, Urine: 42 mg/dL

## 2019-02-23 MED ORDER — FUROSEMIDE 10 MG/ML IJ SOLN
40.0000 mg | Freq: Once | INTRAMUSCULAR | Status: AC
  Administered 2019-02-23: 40 mg via INTRAVENOUS
  Filled 2019-02-23: qty 4

## 2019-02-23 MED ORDER — SODIUM CHLORIDE 0.9 % IV SOLN
INTRAVENOUS | Status: DC
  Administered 2019-02-23: 15:00:00 via INTRAVENOUS

## 2019-02-23 NOTE — Consult Note (Signed)
[facility] CONSULT NOTE    Date: 02/23/2019                  Patient Name:  Stephen [name]  MRN: [id number]  DOB: 1966-10-17  Age / Sex: 53 y.o., male         PCP: Valerie Roys, DO                 Service Requesting Consult: Hospitalist                 Reason for Consult: Acute renal failure/CKD stage III            History of Present Illness: Patient is a 53 y.o. male with a PMHx of chronic systolic heart failure ejection fraction 10-15% status post AICD placement, chronic kidney disease stage III, history of TIA, COPD, who was admitted to [facility] on 02/17/2019 for evaluation of nausea and vomiting.  He was initially treated with gentle IV fluid hydration.  We are asked to see him for evaluation management of acute renal failure with chronic kidney disease stage III.  His baseline creatinine appears to be 1.49 with an EGFR of greater than 60.  Upon presentation his creatinine was up to 2.06.  At the moment renal function is worsening with a BUN of 43 and creatinine of 2.65.   Medications: Outpatient medications: Medications Prior to Admission  Medication Sig Dispense Refill Last Dose  . acetaminophen (TYLENOL) 325 MG tablet Take 2 tablets (650 mg total) by mouth every 6 (six) hours as needed. (Patient taking differently: Take 325-650 mg by mouth every 6 (six) hours as needed for moderate pain. ) 60 tablet 0 Unknown at PRN  . albuterol (PROVENTIL HFA;VENTOLIN HFA) 108 (90 Base) MCG/ACT inhaler Inhale 2 puffs into the lungs every 4 (four) hours as needed for wheezing or shortness of breath. 1 Inhaler 0 Unknown at PRN  . allopurinol (ZYLOPRIM) 100 MG tablet Take 2 tablets (200 mg total) by mouth daily. (Patient taking differently: Take 200 mg by mouth daily as needed. ) 60 tablet 6 Unknown at PRN  . amiodarone (PACERONE) 200 MG tablet Take 1 tablet (200 mg total) by mouth daily.   02/17/2019 at 1000  . aspirin EC 81 MG EC tablet Take 1 tablet (81 mg total) by  mouth daily. 90 tablet 3 02/17/2019 at 1000  . atorvastatin (LIPITOR) 40 MG tablet Take 80 mg by mouth daily at 6 PM.   6 02/16/2019 at 1800  . CAMPHOR-EUCALYPTUS-MENTHOL EX Apply 1 application topically as needed (congestion).   Unknown at PRN  . carvedilol (COREG) 3.125 MG tablet Take 1 tablet (3.125 mg total) by mouth 2 (two) times daily with a meal. (Patient taking differently: Take 6.25 mg by mouth daily. ) 60 tablet 0 02/17/2019 at 1000  . clopidogrel (PLAVIX) 75 MG tablet TAKE ONE TABLET BY MOUTH EVERY DAY (Patient taking differently: Take 75 mg by mouth daily. ) 30 tablet 5 02/17/2019 at 1000  . colchicine 0.6 MG tablet Take 1 tablet (0.6 mg total) by mouth daily as needed (gout flare). 6 tablet 1 Unknown at PRN  . cyclobenzaprine (FLEXERIL) 5 MG tablet Take 1 tablet (5 mg total) by mouth 3 (three) times daily as needed (jaw pain). 20 tablet 0 Unknown at PRN  . diclofenac sodium (VOLTAREN) 1 % GEL Apply 2 g topically 4 (four) times daily as needed (pain).    Unknown at PRN  . famotidine (PEPCID) 20 MG tablet  Take 1 tablet (20 mg total) by mouth 2 (two) times daily for 15 days. 30 tablet 0 02/17/2019 at 1000  . fluticasone (FLONASE) 50 MCG/ACT nasal spray Place 2 sprays into both nostrils daily. (Patient taking differently: Place 2 sprays into both nostrils daily as needed for allergies. ) 16 g 2 Unknown at PRN  . fluticasone-salmeterol (ADVAIR HFA) 115-21 MCG/ACT inhaler Inhale 2 puffs into the lungs 2 (two) times daily. 1 Inhaler 12 02/17/2019 at 1000  . meloxicam (MOBIC) 15 MG tablet Take 15 mg by mouth daily as needed for pain.    Unknown at PRN  . Menthol, Topical Analgesic, (ICY HOT EX) Apply 1 application topically daily as needed (pain).   Unknown at PRN  . methimazole (TAPAZOLE) 10 MG tablet Take 4 tablets (40 mg total) by mouth daily. 120 tablet 0 02/17/2019 at 1000  . metolazone (ZAROXOLYN) 2.5 MG tablet Take 2.5 mg by mouth as needed.   11 Unknown at PRN  . Multiple Vitamin (MULTIVITAMIN  WITH MINERALS) TABS tablet Take 1 tablet by mouth daily.   02/17/2019 at 1000  . nitroGLYCERIN (NITROSTAT) 0.4 MG SL tablet Place 1 tablet (0.4 mg total) under the tongue every 5 (five) minutes as needed for chest pain. 25 tablet 3 Unknown at PRN  . ondansetron (ZOFRAN ODT) 4 MG disintegrating tablet Take 1 tablet (4 mg total) by mouth every 8 (eight) hours as needed for nausea or vomiting. 20 tablet 0 Unknown at PRN  . potassium chloride SA (K-DUR,KLOR-CON) 20 MEQ tablet Take 1 tablet (20 mEq total) by mouth 4 (four) times daily. 120 tablet 0 02/17/2019 at 1000  . promethazine (PHENERGAN) 12.5 MG tablet Take 1 tablet (12.5 mg total) by mouth every 6 (six) hours as needed for nausea or vomiting. 20 tablet 0 Unknown at PRN  . sacubitril-valsartan (ENTRESTO) 49-51 MG Take 1 tablet by mouth 2 (two) times daily.   02/17/2019 at 1000  . Tiotropium Bromide Monohydrate (SPIRIVA RESPIMAT) 2.5 MCG/ACT AERS Inhale 1 puff into the lungs daily. 1 Inhaler 12 02/17/2019 at 1000  . torsemide (DEMADEX) 20 MG tablet Take 2 tablets (40 mg total) by mouth daily. (Patient taking differently: Take 40 mg by mouth 2 (two) times daily. ) 60 tablet 0 02/17/2019 at 1000    Current medications: Current Facility-Administered Medications  Medication Dose Route Frequency Provider Last Rate Last Dose  . acetaminophen (TYLENOL) tablet 650 mg  650 mg Oral Q6H PRN [name], Aruna, MD   650 mg at 02/18/19 1207   Or  . acetaminophen (TYLENOL) suppository 650 mg  650 mg Rectal Q6H PRN [name], Aruna, MD      . amiodarone (PACERONE) tablet 200 mg  200 mg Oral Daily Mayo, Pete Pelt, MD   200 mg at 02/23/19 0908  . aspirin EC tablet 81 mg  81 mg Oral Daily Mayo, Pete Pelt, MD   81 mg at 02/23/19 3500  . atorvastatin (LIPITOR) tablet 80 mg  80 mg Oral q1800 Sela Hua, MD   80 mg at 02/22/19 1808  . bisacodyl (DULCOLAX) suppository 10 mg  10 mg Rectal Daily PRN [name], Aruna, MD      . carvedilol (COREG) tablet 3.125 mg  3.125 mg Oral BID WC  Mayo, Pete Pelt, MD   3.125 mg at 02/23/19 0907  . clopidogrel (PLAVIX) tablet 75 mg  75 mg Oral Daily Mayo, Pete Pelt, MD   75 mg at 02/23/19 9381  . colchicine tablet 0.6 mg  0.6 mg Oral  Daily Harrie Foreman, MD   0.6 mg at 02/23/19 8295  . diclofenac sodium (VOLTAREN) 1 % transdermal gel 4 g  4 g Topical QID Mayo, Pete Pelt, MD   4 g at 02/23/19 0904  . docusate sodium (COLACE) capsule 100 mg  100 mg Oral BID [name], Aruna, MD   100 mg at 02/22/19 0852  . enoxaparin (LOVENOX) injection 40 mg  40 mg Subcutaneous Q24H [name], Aruna, MD   40 mg at 02/23/19 1214  . methimazole (TAPAZOLE) tablet 50 mg  50 mg Oral Daily Mayo, Pete Pelt, MD   50 mg at 02/20/19 0957  . mometasone-formoterol (DULERA) 200-5 MCG/ACT inhaler 2 puff  2 puff Inhalation BID Mayo, Pete Pelt, MD   2 puff at 02/23/19 0908  . ondansetron (ZOFRAN) tablet 4 mg  4 mg Oral Q6H PRN [name], Aruna, MD       Or  . ondansetron (ZOFRAN) injection 4 mg  4 mg Intravenous Q6H PRN [name], Aruna, MD   4 mg at 02/21/19 1840  . pantoprazole (PROTONIX) EC tablet 40 mg  40 mg Oral Daily Mayo, Pete Pelt, MD   40 mg at 02/21/19 6213  . polyethylene glycol (MIRALAX / GLYCOLAX) packet 17 g  17 g Oral Daily [name], Aruna, MD   17 g at 02/21/19 1427  . promethazine (PHENERGAN) injection 12.5-25 mg  12.5-25 mg Intravenous Q6H PRN Lance Coon, MD   12.5 mg at 02/22/19 1818  . sodium chloride flush (NS) 0.9 % injection 3 mL  3 mL Intravenous Once [name], Aruna, MD      . tiotropium (SPIRIVA) inhalation capsule ([facility] use ONLY) 18 mcg  1 capsule Inhalation Daily Mayo, Pete Pelt, MD   18 mcg at 02/23/19 1034  . traMADol (ULTRAM) tablet 50 mg  50 mg Oral Q6H PRN [name], Aruna, MD   50 mg at 02/20/19 0413      Allergies: Allergies  Allergen Reactions  . Lisinopril Cough      Past Medical History: Past Medical History:  Diagnosis Date  . AICD (automatic cardioverter/defibrillator) present   . AKI (acute kidney injury) ([place]) 12/24/2017  . Arrhythmia    . Arthritis    "lower back, knees" (06/10/2018)  . Cardiac arrest ([place]) 12/23/2017   Brief V-fib arrest  . Chest pain 09/08/2017  . CHF (congestive heart failure) ([facility])    nonischemic cardiomyopathy, EF 25%  . Gout    "on daily RX" (06/10/2018)  . Headache    "q couple months" (06/10/2018)  . High cholesterol   . History of kidney stones   . Hypertension   . Influenza A 12/24/2017  . NICM (nonischemic cardiomyopathy) ([place])   . Pneumonia 12/20/2017  . TIA (transient ischemic attack) 06/21/2016   "still affects my memory a little bit" (06/10/2018)     Past Surgical History: Past Surgical History:  Procedure Laterality Date  . EXTERNAL FIXATION LEG Right ~ 2000   "was going bowlegged; had to brake my leg to fix it"  . HERNIA REPAIR    . ICD IMPLANT N/A 12/30/2017   Procedure: ICD IMPLANT;  Surgeon: Deboraha Sprang, MD;  Location: [facility];  Service: Cardiovascular;  Laterality: N/A;  . LEFT HEART CATH AND CORONARY ANGIOGRAPHY N/A 09/09/2017   Procedure: LEFT HEART CATH AND CORONARY ANGIOGRAPHY;  Surgeon: Teodoro Spray, MD;  Location: [facility];  Service: Cardiovascular;  Laterality: N/A;  . UMBILICAL HERNIA REPAIR  1990s  . V TACH ABLATION  06/10/2018  . V TACH ABLATION N/A 06/10/2018   Procedure: V TACH ABLATION;  Surgeon: Thompson Grayer, MD;  Location: [facility];  Service: Cardiovascular;  Laterality: N/A;  . VASECTOMY       Family History: Family History  Problem Relation Age of Onset  . Hypertension Mother   . Heart failure Mother   . Hypertension Father   . CAD Father   . Heart attack Father      Social History: Social History   Socioeconomic History  . Marital status: Single    Spouse name: Not on file  . Number of children: Not on file  . Years of education: Not on file  . Highest education level: Not on file  Occupational History  . Not on file  Social Needs  . Financial resource strain: Not on file  . Food insecurity:     Worry: Not on file    Inability: Not on file  . Transportation needs:    Medical: Not on file    Non-medical: Not on file  Tobacco Use  . Smoking status: Former Smoker    Packs/day: 0.33    Years: 33.00    Pack years: 10.89    Types: Cigarettes    Last attempt to quit: 02/20/2016    Years since quitting: 3.0  . Smokeless tobacco: Never Used  Substance and Sexual Activity  . Alcohol use: Yes    Alcohol/week: 0.0 standard drinks    Comment: 06/10/2018 "beer once/month; if that"  . Drug use: Never  . Sexual activity: Yes  Lifestyle  . Physical activity:    Days per week: Not on file    Minutes per session: Not on file  . Stress: Not on file  Relationships  . Social connections:    Talks on phone: Not on file    Gets together: Not on file    Attends religious service: Not on file    Active member of club or organization: Not on file    Attends meetings of clubs or organizations: Not on file    Relationship status: Not on file  . Intimate partner violence:    Fear of current or ex partner: Not on file    Emotionally abused: Not on file    Physically abused: Not on file    Forced sexual activity: Not on file  Other Topics Concern  . Not on file  Social History Narrative   Independent at baseline, ambulates steadily     Review of Systems: Review of Systems  Constitutional: Positive for malaise/fatigue. Negative for chills and fever.  HENT: Negative for hearing loss, nosebleeds and tinnitus.   Eyes: Negative for blurred vision and double vision.  Respiratory: Negative for cough, hemoptysis and sputum production.   Cardiovascular: Positive for palpitations. Negative for chest pain and leg swelling.  Gastrointestinal: Positive for nausea and vomiting. Negative for heartburn.  Genitourinary: Negative for dysuria and urgency.  Musculoskeletal: Negative for joint pain and myalgias.  Skin: Negative for itching and rash.  Neurological: Negative for dizziness and focal weakness.   Endo/Heme/Allergies: Negative for polydipsia. Does not bruise/bleed easily.  Psychiatric/Behavioral: Negative for depression. The patient is not nervous/anxious.      Vital Signs: Blood pressure (!) 117/95, pulse 77, temperature 98 F (36.7 C), temperature source Oral, resp. rate 18, height '6\' 1"'  (1.854 m), weight 105.5 kg, SpO2 98 %.  Weight trends: Filed Weights   02/21/19 0409 02/22/19 0444 02/23/19 0525  Weight: 106.6 kg 97.1 kg  105.5 kg    Physical Exam: General: NAD,   Head: Normocephalic, atraumatic.  Eyes: Anicteric, EOMI  Nose: Mucous membranes moist, not inflammed, nonerythematous.  Throat: Oropharynx nonerythematous, no exudate appreciated.   Neck: Supple, trachea midline.  Lungs:  Normal respiratory effort. Clear to auscultation BL without crackles or wheezes.  Heart: RRR. S1 and S2 normal without gallop, murmur, or rubs.  Abdomen:  BS normoactive. Soft, Nondistended, non-tender.  No masses or organomegaly.  Extremities: No pretibial edema.  Neurologic: A&O X3, Motor strength is 5/5 in the all 4 extremities  Skin: No visible rashes, scars.    Lab results: Basic Metabolic Panel: Recent Labs  Lab 02/18/19 0458  02/21/19 0551 02/22/19 0354 02/23/19 0622  NA 143   < > 138 139 139  K 3.2*   < > 4.1 3.9 4.1  CL 106   < > 108 108 107  CO2 25   < > 20* 23 20*  GLUCOSE 92   < > 113* 97 102*  BUN 29*   < > 35* 37* 43*  CREATININE 2.07*   < > 2.20* 2.32* 2.65*  CALCIUM 9.3   < > 9.3 9.0 9.2  MG 1.9  --   --   --   --    < > = values in this interval not displayed.    Liver Function Tests: Recent Labs  Lab 02/21/19 0551 02/22/19 0354 02/23/19 0622  AST 15 13* 14*  ALT '18 16 16  ' ALKPHOS 74 68 73  BILITOT 1.3* 1.1 0.9  PROT 6.8 6.3* 6.6  ALBUMIN 3.5 3.2* 3.3*   No results for input(s): LIPASE, AMYLASE in the last 168 hours. No results for input(s): AMMONIA in the last 168 hours.  CBC: Recent Labs  Lab 02/17/19 1254 02/18/19 0458 02/19/19 0407  02/20/19 0634  WBC 8.1 8.1 8.1 8.7  HGB 14.1 13.4 12.4* 12.9*  HCT 43.2 40.9 39.0 40.5  MCV 86.4 86.3 88.8 88.6  PLT 216 188 183 186    Cardiac Enzymes: Recent Labs  Lab 02/17/19 1254 02/17/19 1844 02/17/19 2132 02/18/19 0458 02/18/19 0808  TROPONINI 0.04* 0.07* 0.08* 0.07* 0.05*    BNP: Invalid input(s): POCBNP  CBG: No results for input(s): GLUCAP in the last 168 hours.  Microbiology: Results for orders placed or performed during the hospital encounter of 02/17/19  Gastrointestinal Panel by PCR , Stool     Status: None   Collection Time: 02/18/19  8:27 PM  Result Value Ref Range Status   Campylobacter species NOT DETECTED NOT DETECTED Final   Plesimonas shigelloides NOT DETECTED NOT DETECTED Final   Salmonella species NOT DETECTED NOT DETECTED Final   Yersinia enterocolitica NOT DETECTED NOT DETECTED Final   Vibrio species NOT DETECTED NOT DETECTED Final   Vibrio cholerae NOT DETECTED NOT DETECTED Final   Enteroaggregative E coli (EAEC) NOT DETECTED NOT DETECTED Final   Enteropathogenic E coli (EPEC) NOT DETECTED NOT DETECTED Final   Enterotoxigenic E coli (ETEC) NOT DETECTED NOT DETECTED Final   Shiga like toxin producing E coli (STEC) NOT DETECTED NOT DETECTED Final   Shigella/Enteroinvasive E coli (EIEC) NOT DETECTED NOT DETECTED Final   Cryptosporidium NOT DETECTED NOT DETECTED Final   Cyclospora cayetanensis NOT DETECTED NOT DETECTED Final   Entamoeba histolytica NOT DETECTED NOT DETECTED Final   Giardia lamblia NOT DETECTED NOT DETECTED Final   Adenovirus F40/41 NOT DETECTED NOT DETECTED Final   Astrovirus NOT DETECTED NOT DETECTED Final   Norovirus GI/GII NOT DETECTED NOT  DETECTED Final   Rotavirus A NOT DETECTED NOT DETECTED Final   Sapovirus (I, II, IV, and V) NOT DETECTED NOT DETECTED Final    Comment: Performed at [facility], [address] [place], [place] [address]    Coagulation Studies: No results for input(s): LABPROT, INR in the  last 72 hours.  Urinalysis: No results for input(s): COLORURINE, LABSPEC, PHURINE, GLUCOSEU, HGBUR, BILIRUBINUR, KETONESUR, PROTEINUR, UROBILINOGEN, NITRITE, LEUKOCYTESUR in the last 72 hours.  Invalid input(s): APPERANCEUR    Imaging:  No results found.   Assessment & Plan: Pt is a 53 y.o. male with a PMHX of chronic systolic heart failure ejection fraction 10-15%, chronic kidney disease stage III, prior history of cardiac arrest, hyperlipidemia, nephrolithiasis, hypertension, history of TIA, was admitted to [facility] on 02/17/2019 with nausea, vomiting, and acute renal failure.   1.  Acute renal failure likely secondary to nausea and vomiting. 2.  Chronic kidney disease stage II baseline creatinine 1.4. 3.  Chronic systolic heart failure.  Plan:   Patient does not appear to be in decompensated heart failure at the moment.  We will restart the patient on gentle IV fluid hydration with 0.9 normal saline at 40 cc per hour.  Continue to hold diuretic therapy. Check renal ultrasound to make sure there is no underlying obstruction.  Further plan as patient progresses.  Thanks for consultation.

## 2019-02-23 NOTE — Plan of Care (Signed)
  Problem: Clinical Measurements: Goal: Respiratory complications will improve Outcome: Progressing Note:  Continues on 2L O2 which is acute   Problem: Activity: Goal: Risk for activity intolerance will decrease Outcome: Progressing Note:  Independent in room   Problem: Nutrition: Goal: Adequate nutrition will be maintained Outcome: Progressing Note:  Clear liquid diet, no nausea/vomiting last night   Problem: Coping: Goal: Level of anxiety will decrease Outcome: Progressing   Problem: Pain Managment: Goal: General experience of comfort will improve Outcome: Progressing Note:  No complaints of pain this shift   Problem: Safety: Goal: Ability to remain free from injury will improve Outcome: Progressing   Problem: Skin Integrity: Goal: Risk for impaired skin integrity will decrease Outcome: Progressing   Problem: Education: Goal: Knowledge of General Education information will improve Description Including pain rating scale, medication(s)/side effects and non-pharmacologic comfort measures Outcome: Completed/Met

## 2019-02-23 NOTE — Progress Notes (Signed)
[facility] [place] NAME: Ky Rumple    MR#:  [id number]  DATE OF BIRTH:  Nov 02, 1966  SUBJECTIVE:   Patient reports intractable nausea but denies any vomiting.  Reports exertional dyspnea but patient is not on any IV fluids  REVIEW OF SYSTEMS:  Review of Systems  Constitutional: Negative for chills and fever.  HENT: Negative for congestion and sore throat.   Eyes: Negative for blurred vision and double vision.  Respiratory: Positive for shortness of breath. Negative for cough.   Cardiovascular: Negative for chest pain and palpitations.  Gastrointestinal: Negative for abdominal pain, diarrhea, nausea and vomiting.  Genitourinary: Negative for dysuria and urgency.  Musculoskeletal: Negative for back pain and neck pain.  Neurological: Negative for dizziness and headaches.  Psychiatric/Behavioral: Negative for depression. The patient is not nervous/anxious.     DRUG ALLERGIES:   Allergies  Allergen Reactions  . Lisinopril Cough   VITALS:  Blood pressure 104/72, pulse 76, temperature (!) 97.4 F (36.3 C), temperature source Oral, resp. rate 18, height 6\' 1"  (1.854 m), weight 105.5 kg, SpO2 100 %. PHYSICAL EXAMINATION:  Physical Exam  GENERAL:  53 y.o.-year-old patient lying in the bed with no acute distress.  EYES: Pupils equal, round, reactive to light and accommodation. No scleral icterus. Extraocular muscles intact.  HEENT: Head atraumatic, normocephalic. Oropharynx and nasopharynx clear.    Moist mucous membranes NECK:  Supple, no jugular venous distention. No thyroid enlargement, no tenderness.  LUNGS: Normal breath sounds bilaterally, no wheezing, rales,rhonchi or crepitation. No use of accessory muscles of respiration.  CARDIOVASCULAR: RRR, S1, S2 normal. No murmurs, rubs, or gallops.  ABDOMEN: Soft, + Mild distention, ?fluid wave. bowel sounds present. No organomegaly or mass.  EXTREMITIES: No pedal edema, cyanosis, or  clubbing. +left ankle is tender to palpation without erythema or warmth. NEUROLOGIC: CN 2-12 grossly normal, no focal deficits. sensation intact. Gait not checked.  PSYCHIATRIC: The patient is alert and oriented x 3.  SKIN: No obvious rash, lesion, or ulcer.  LABORATORY PANEL:  Male CBC Recent Labs  Lab 02/20/19 0634  WBC 8.7  HGB 12.9*  HCT 40.5  PLT 186   ------------------------------------------------------------------------------------------------------------------ Chemistries  Recent Labs  Lab 02/18/19 0458  02/23/19 0622  NA 143   < > 139  K 3.2*   < > 4.1  CL 106   < > 107  CO2 25   < > 20*  GLUCOSE 92   < > 102*  BUN 29*   < > 43*  CREATININE 2.07*   < > 2.65*  CALCIUM 9.3   < > 9.2  MG 1.9  --   --   AST 14*   < > 14*  ALT 14   < > 16  ALKPHOS 77   < > 73  BILITOT 1.0   < > 0.9   < > = values in this interval not displayed.   RADIOLOGY:  Dg Chest Port 1 View  Result Date: 02/23/2019 CLINICAL DATA:  Shortness of breath EXAM: PORTABLE CHEST 1 VIEW COMPARISON:  02/17/2019 FINDINGS: Left AICD remains in place, unchanged. Cardiomegaly with vascular congestion and mild interstitial edema. Left lower lobe opacity may reflect a left effusion and left basilar atelectasis or infiltrate. No visible effusion on the right. No acute bony abnormality. IMPRESSION: Cardiomegaly with vascular congestion and mild interstitial edema. Left pleural effusion with left lower lobe atelectasis or pneumonia. Electronically Signed   By: Rolm Baptise M.D.  On: 02/23/2019 18:58   ASSESSMENT AND PLAN:   Acute kidney injury on chronic kidney disease- likely due to nausea, vomiting, diarrhea.   Creatinine is getting worse 2.20-2.32-2.65  previously it was at 1.6-2.0 -Renal ultrasound with medical renal disease -Hydrated with IV fluids and discontinue IV fluids as the patient is reporting exertional shortness of breath with ejection fraction 10 to 15% -Holding entresto, torsemide,  metolazone -Avoid nephrotoxins -Nephrology Dr. Zollie Scale is following  Nausea/vomiting/diarrhea-clinically not improving  abdominal x-ray unremarkable.   -Diarrhea resolved -GI consult placed, seen by Dr. Vicente Males, recommending conservative management in view of his cardiomyopathy with ejection fraction 10 to 15% -GI pathogen panel negative -Clear liquid diet -PPI -Gentle IV fluids provided, discontinued currently -IV antiemetics -X-ray and abdominal ultrasound with no acute findings   Left ankle pain- patient states that this feels like his regular gout.  No erythema, edema, or warmth on exam.  Patient has full range of motion. -Continue colchicine -Add heating pad and Voltaren gel, as patient states this usually helps him a lot. -Normal left ankle x-ray  Elevated troponin- likely demand ischemia in the setting of AKI.  Troponins mildly elevated and flat.  No active chest pain. -Monitor  Cardiomyopathy/hx MI/chronic combined CHF ejection fraction 10-15% status post AICD X-ray with fluid overload -40 mg of IV Lasix x1 given Cardiology consult to [facility] cardiology -Holding entresto, torsemide, metolazone -Continue aspirin, Plavix, Coreg, amiodarone -Will need to watch respiratory status closely while giving fluids  Chronic COPD- currently with no exacerbation -Continue home inhalers -DuoNebs PRN  History of hyperthyroidism- TSH undetectable, T4 mildly elevated, T3 normal. -Increase methimazole to 50 mg daily -Needs to follow-up with endocrinology on discharge  All the records are reviewed and case discussed with Care Management/Social Worker. Management plans discussed with the patient, he is in agreement.  CODE STATUS: Full Code  TOTAL TIME TAKING CARE OF THIS PATIENT: 35 minutes.   More than 50% of the time was spent in counseling/coordination of care: YES  POSSIBLE D/C IN 1-2 DAYS, DEPENDING ON CLINICAL CONDITION.   [name] M.D on 02/23/2019 at 9:19 PM  Between 7am  to 6pm - Pager - [phone] After 6pm go to [email or web address] - Proofreader  [facility]  Office  [phone]  CC: Primary care physician; Valerie Roys, DO  Note: This dictation was prepared with Dragon dictation along with smaller phrase technology. Any transcriptional errors that result from this process are unintentional.

## 2019-02-23 NOTE — Progress Notes (Addendum)
Patient complaining of shortness of breath. Lung sounds clear, oxygen saturation 100% on 2L. Patient sitting up on the edge of the bed with no apparent distress. Dr. Margaretmary Eddy notified, per MD contact nephrology. Dr. Holley Raring paged multiple times- no call back. Attempted to call on personal cell number x3 with no answer. Will re-attempt again and continue to monitor patient.   Update: call back from Dr. Holley Raring, explained the situation. Verbal orders with read-back to order stat portable chest xray and discontinue the fluids. Call MD back after xray results. Will stop fluids and continue to monitor patient.  Update: Dr. Holley Raring called back to inform chest xray results in. Verbal orders with read-back to administer 40mg  furosemide intravenous once and continue supplemental oxygen. Cardiology to see him tomorrow.

## 2019-02-23 NOTE — Consult Note (Addendum)
Jonathon Bellows , MD [address] [place], [place], [place], [address] [address], [place], [place], [place], [address] Phone: [phone]  Fax: [phone]  Consultation  Referring Provider:   Dr Margaretmary Eddy Primary Care Physician:  Valerie Roys, DO Primary Gastroenterologist:  Dr. Marius Ditch          Reason for Consultation:     Nausea  Date of Admission:  02/17/2019 Date of Consultation:  02/23/2019         HPI:   Stephen [name] is a 53 y.o. male is a patient known to our practice and particularly Dr. Marius Ditch.  Longstanding history of cardiac issues including a V. fib arrest in the past.  Has an ICD intact.  Last seen Dr. Marius Ditch in 07/19/2018 for rectal bleeding.  Admitted on 02/17/2019 with emesis and shortness of breath.  A similar admission a week prior was given IV fluids and discharged home.  On admission was noted to be in AKI.  He is on methimazole and has an elevated free T4 but normal free T3.  Says has had no issues with nausea till Friday when it all began, no abdominal pain or diarrhea. Just nausea and vomiting . Feels a bit better since admission but not able to tolerate much, he has had a bowel movement , feels very bloated.   Past Medical History:  Diagnosis Date  . AICD (automatic cardioverter/defibrillator) present   . AKI (acute kidney injury) ([facility]) 12/24/2017  . Arrhythmia   . Arthritis    "lower back, knees" (06/10/2018)  . Cardiac arrest ([facility]) 12/23/2017   Brief V-fib arrest  . Chest pain 09/08/2017  . CHF (congestive heart failure) ([facility])    nonischemic cardiomyopathy, EF 25%  . Gout    "on daily RX" (06/10/2018)  . Headache    "q couple months" (06/10/2018)  . High cholesterol   . History of kidney stones   . Hypertension   . Influenza A 12/24/2017  . NICM (nonischemic cardiomyopathy) ([facility])   . Pneumonia 12/20/2017  . TIA (transient ischemic attack) 06/21/2016   "still affects my memory a little bit" (06/10/2018)    Past Surgical History:  Procedure  Laterality Date  . EXTERNAL FIXATION LEG Right ~ 2000   "was going bowlegged; had to brake my leg to fix it"  . HERNIA REPAIR    . ICD IMPLANT N/A 12/30/2017   Procedure: ICD IMPLANT;  Surgeon: Deboraha Sprang, MD;  Location: [facility];  Service: Cardiovascular;  Laterality: N/A;  . LEFT HEART CATH AND CORONARY ANGIOGRAPHY N/A 09/09/2017   Procedure: LEFT HEART CATH AND CORONARY ANGIOGRAPHY;  Surgeon: Teodoro Spray, MD;  Location: [facility];  Service: Cardiovascular;  Laterality: N/A;  . UMBILICAL HERNIA REPAIR  1990s  . V TACH ABLATION  06/10/2018  . V TACH ABLATION N/A 06/10/2018   Procedure: V TACH ABLATION;  Surgeon: Thompson Grayer, MD;  Location: [facility];  Service: Cardiovascular;  Laterality: N/A;  . VASECTOMY      Prior to Admission medications   Medication Sig Start Date End Date Taking? Authorizing Provider  acetaminophen (TYLENOL) 325 MG tablet Take 2 tablets (650 mg total) by mouth every 6 (six) hours as needed. Patient taking differently: Take 325-650 mg by mouth every 6 (six) hours as needed for moderate pain.  03/19/18  Yes Carrie Mew, MD  albuterol (PROVENTIL HFA;VENTOLIN HFA) 108 (90 Base) MCG/ACT inhaler Inhale 2 puffs into the lungs every 4 (four)  hours as needed for wheezing or shortness of breath. 09/19/18  Yes Johnson, Megan P, DO  allopurinol (ZYLOPRIM) 100 MG tablet Take 2 tablets (200 mg total) by mouth daily. Patient taking differently: Take 200 mg by mouth daily as needed.  10/19/18  Yes Johnson, Megan P, DO  amiodarone (PACERONE) 200 MG tablet Take 1 tablet (200 mg total) by mouth daily. 01/20/19  Yes Deboraha Sprang, MD  aspirin EC 81 MG EC tablet Take 1 tablet (81 mg total) by mouth daily. 01/01/18  Yes Daune Perch, NP  atorvastatin (LIPITOR) 40 MG tablet Take 80 mg by mouth daily at 6 PM.  10/29/18  Yes [provider]  CAMPHOR-EUCALYPTUS-MENTHOL EX Apply 1 application topically as needed (congestion).   Yes [provider]  carvedilol (COREG) 3.125 MG tablet Take 1 tablet (3.125 mg total) by mouth 2 (two) times daily with a meal. Patient taking differently: Take 6.25 mg by mouth daily.  07/06/18  Yes Wieting, Richard, MD  clopidogrel (PLAVIX) 75 MG tablet TAKE ONE TABLET BY MOUTH EVERY DAY Patient taking differently: Take 75 mg by mouth daily.  02/13/19  Yes Bensimhon, Shaune Pascal, MD  colchicine 0.6 MG tablet Take 1 tablet (0.6 mg total) by mouth daily as needed (gout flare). 09/03/18  Yes Johnson, Megan P, DO  cyclobenzaprine (FLEXERIL) 5 MG tablet Take 1 tablet (5 mg total) by mouth 3 (three) times daily as needed (jaw pain). 11/24/18  Yes Nance Pear, MD  diclofenac sodium (VOLTAREN) 1 % GEL Apply 2 g topically 4 (four) times daily as needed (pain).    Yes [provider]  famotidine (PEPCID) 20 MG tablet Take 1 tablet (20 mg total) by mouth 2 (two) times daily for 15 days. 02/16/19 03/03/19 Yes Arta Silence, MD  fluticasone (FLONASE) 50 MCG/ACT nasal spray Place 2 sprays into both nostrils daily. Patient taking differently: Place 2 sprays into both nostrils daily as needed for allergies.  06/20/17  Yes [name], MD  fluticasone-salmeterol (ADVAIR HFA) [phone] MCG/ACT inhaler Inhale 2 puffs into the lungs 2 (two) times daily. 08/12/18  Yes Kasa, Maretta Bees, MD  meloxicam (MOBIC) 15 MG tablet Take 15 mg by mouth daily as needed for pain.    Yes [provider]  Menthol, Topical Analgesic, (ICY HOT EX) Apply 1 application topically daily as needed (pain).   Yes [provider]  methimazole (TAPAZOLE) 10 MG tablet Take 4 tablets (40 mg total) by mouth daily. 12/09/18  Yes Mayo, Pete Pelt, MD  metolazone (ZAROXOLYN) 2.5 MG tablet Take 2.5 mg by mouth as needed.  09/05/18  Yes [provider]  Multiple Vitamin (MULTIVITAMIN WITH MINERALS) TABS tablet Take 1 tablet by mouth daily.   Yes [provider]  nitroGLYCERIN (NITROSTAT) 0.4 MG SL tablet Place 1 tablet  (0.4 mg total) under the tongue every 5 (five) minutes as needed for chest pain. 02/19/18  Yes Bensimhon, Shaune Pascal, MD  ondansetron (ZOFRAN ODT) 4 MG disintegrating tablet Take 1 tablet (4 mg total) by mouth every 8 (eight) hours as needed for nausea or vomiting. 01/24/19  Yes Harvest Dark, MD  potassium chloride SA (K-DUR,KLOR-CON) 20 MEQ tablet Take 1 tablet (20 mEq total) by mouth 4 (four) times daily. 01/14/19  Yes Deboraha Sprang, MD  promethazine (PHENERGAN) 12.5 MG tablet Take 1 tablet (12.5 mg total) by mouth every 6 (six) hours as needed for nausea or vomiting. 11/24/18  Yes Nance Pear, MD  sacubitril-valsartan (ENTRESTO) 49-51 MG Take 1 tablet by  mouth 2 (two) times daily.   Yes [provider]  Tiotropium Bromide Monohydrate (SPIRIVA RESPIMAT) 2.5 MCG/ACT AERS Inhale 1 puff into the lungs daily. 08/12/18  Yes [name], MD  torsemide (DEMADEX) 20 MG tablet Take 2 tablets (40 mg total) by mouth daily. Patient taking differently: Take 40 mg by mouth 2 (two) times daily.  07/06/18  Yes Loletha Grayer, MD    Family History  Problem Relation Age of Onset  . Hypertension Mother   . Heart failure Mother   . Hypertension Father   . CAD Father   . Heart attack Father      Social History   Tobacco Use  . Smoking status: Former Smoker    Packs/day: 0.33    Years: 33.00    Pack years: 10.89    Types: Cigarettes    Last attempt to quit: 02/20/2016    Years since quitting: 3.0  . Smokeless tobacco: Never Used  Substance Use Topics  . Alcohol use: Yes    Alcohol/week: 0.0 standard drinks    Comment: 06/10/2018 "beer once/month; if that"  . Drug use: Never    Allergies as of 02/17/2019 - Review Complete 02/17/2019  Allergen Reaction Noted  . Lisinopril Cough 06/21/2016    Review of Systems:    All systems reviewed and negative except where noted in HPI.   Physical Exam:  Vital signs in last 24 hours: Temp:  [97.7 F (36.5 C)-98.9 F (37.2 C)] 98 F (36.7  C) (02/24 0734) Pulse Rate:  [71-77] 77 (02/24 0734) Resp:  [18-19] 18 (02/24 0917) BP: (93-119)/(74-95) 117/95 (02/24 0734) SpO2:  [98 %-100 %] 98 % (02/24 0734) Weight:  [105.5 kg] 105.5 kg (02/24 0525) Last BM Date: 02/22/19 General:   Pleasant, cooperative in NAD Head:  Normocephalic and atraumatic. Eyes:   No icterus.   Conjunctiva pink. PERRLA. Ears:  Normal auditory acuity. Neck:  Supple; no masses or thyroidomegaly Lungs: Respirations even and unlabored. Lungs clear to auscultation bilaterally.   No wheezes, crackles, or rhonchi.  Heart:  Regular rate and rhythm;  Without murmur, clicks, rubs or gallops Abdomen:  Soft, nondistended, nontender. Normal bowel sounds. No appreciable masses or hepatomegaly.  No rebound or guarding.  Neurologic:  Alert and oriented x3;  grossly normal neurologically. Skin:  Intact without significant lesions or rashes. Cervical Nodes:  No significant cervical adenopathy. Psych:  Alert and cooperative. Normal affect.  LAB RESULTS: No results for input(s): WBC, HGB, HCT, PLT in the last 72 hours. BMET Recent Labs    02/21/19 0551 02/22/19 0354 02/23/19 0622  NA 138 139 139  K 4.1 3.9 4.1  CL 108 108 107  CO2 20* 23 20*  GLUCOSE 113* 97 102*  BUN 35* 37* 43*  CREATININE 2.20* 2.32* 2.65*  CALCIUM 9.3 9.0 9.2   LFT Recent Labs    02/23/19 0622  PROT 6.6  ALBUMIN 3.3*  AST 14*  ALT 16  ALKPHOS 73  BILITOT 0.9   PT/INR No results for input(s): LABPROT, INR in the last 72 hours.  STUDIES: No results found.    Impression / Plan:   OMRAN KEELIN is a 53 y.o. y/o male with a history of cardiac issues including V. fib arrest.  I have been contacted for nausea.  He was admitted about a week back.  He suffers  from hypothyroidism and is on methimazole.  He is also on amiodarone.  The upper quadrant ultrasound is normal.  Plan  1. He has  a very low EF hence would not rush into an EGD as this time as he would be high risk for  anesthesia . .  2 . Will suggest to continue conservative management if still persists will consider CT scan to evaluate further   Thank you for involving me in the care of this patient.      LOS: 6 days   Jonathon Bellows, MD  02/23/2019, 1:20 PM

## 2019-02-24 ENCOUNTER — Inpatient Hospital Stay: Payer: Commercial Managed Care - PPO

## 2019-02-24 ENCOUNTER — Inpatient Hospital Stay
Admit: 2019-02-24 | Discharge: 2019-02-24 | Disposition: A | Discharge status: Home / Self Care | Payer: Commercial Managed Care - PPO | Attending: Internal Medicine | Admitting: Internal Medicine

## 2019-02-24 DIAGNOSIS — I5023 Acute on chronic systolic (congestive) heart failure: Secondary | ICD-10-CM

## 2019-02-24 LAB — PROTEIN ELECTRO, RANDOM URINE
Albumin ELP, Urine: 49.7 %
Alpha-1-Globulin, U: 2.9 %
Alpha-2-Globulin, U: 11.7 %
Beta Globulin, U: 16.1 %
Gamma Globulin, U: 19.6 %
Total Protein, Urine: 44.5 mg/dL

## 2019-02-24 LAB — COMPREHENSIVE METABOLIC PANEL
ALT: 19 U/L (ref 0–44)
AST: 16 U/L (ref 15–41)
Albumin: 3.2 g/dL — ABNORMAL LOW (ref 3.5–5.0)
Alkaline Phosphatase: 70 U/L (ref 38–126)
Anion gap: 9 (ref 5–15)
BUN: 42 mg/dL — ABNORMAL HIGH (ref 6–20)
CO2: 23 mmol/L (ref 22–32)
Calcium: 9.1 mg/dL (ref 8.9–10.3)
Chloride: 107 mmol/L (ref 98–111)
Creatinine, Ser: 2.64 mg/dL — ABNORMAL HIGH (ref 0.61–1.24)
GFR calc Af Amer: 31 mL/min — ABNORMAL LOW (ref >60)
GFR calc non Af Amer: 26 mL/min — ABNORMAL LOW (ref >60)
GLUCOSE: 97 mg/dL (ref 70–99)
Potassium: 3.4 mmol/L — ABNORMAL LOW (ref 3.5–5.1)
SODIUM: 139 mmol/L (ref 135–145)
Total Bilirubin: 1 mg/dL (ref 0.3–1.2)
Total Protein: 6.3 g/dL — ABNORMAL LOW (ref 6.5–8.1)

## 2019-02-24 LAB — C4 COMPLEMENT: COMPLEMENT C4, BODY FLUID: 18 mg/dL (ref 14–44)

## 2019-02-24 LAB — C3 COMPLEMENT: C3 Complement: 136 mg/dL (ref 82–167)

## 2019-02-24 LAB — ANA W/REFLEX IF POSITIVE: Anti Nuclear Antibody(ANA): NEGATIVE

## 2019-02-24 MED ORDER — SODIUM CHLORIDE 0.9 % IV SOLN
INTRAVENOUS | Status: DC | PRN
  Administered 2019-02-24: 1000 mL via INTRAVENOUS
  Administered 2019-02-27: 500 mL via INTRAVENOUS

## 2019-02-24 MED ORDER — FUROSEMIDE 10 MG/ML IJ SOLN
6.0000 mg/h | INTRAVENOUS | Status: DC
  Administered 2019-02-24 – 2019-02-28 (×3): 6 mg/h via INTRAVENOUS
  Filled 2019-02-24 (×3): qty 25

## 2019-02-24 MED ORDER — BOOST / RESOURCE BREEZE PO LIQD CUSTOM
1.0000 | Freq: Three times a day (TID) | ORAL | Status: DC
  Administered 2019-02-24 – 2019-02-25 (×3): 1 via ORAL

## 2019-02-24 MED ORDER — PERFLUTREN LIPID MICROSPHERE
1.0000 mL | INTRAVENOUS | Status: AC | PRN
  Administered 2019-02-24: 2 mL via INTRAVENOUS
  Filled 2019-02-24: qty 10

## 2019-02-24 MED ORDER — POTASSIUM CHLORIDE CRYS ER 20 MEQ PO TBCR
40.0000 meq | EXTENDED_RELEASE_TABLET | Freq: Once | ORAL | Status: AC
  Administered 2019-02-24: 40 meq via ORAL
  Filled 2019-02-24: qty 2

## 2019-02-24 NOTE — Progress Notes (Signed)
[facility]  ROUNDING NOTE   Subjective:  Patient still complaining of significant shortness of breath today. Chest x-ray was performed last night and showed pulmonary edema. Patient given IV Lasix yesterday and now started on Lasix drip today.   Objective:  Vital signs in last 24 hours:  Temp:  [97.4 F (36.3 C)-98.2 F (36.8 C)] 97.5 F (36.4 C) (02/25 0740) Pulse Rate:  [37-77] 67 (02/25 0913) Resp:  [18] 18 (02/25 0411) BP: (104-114)/(69-97) 106/69 (02/25 0740) SpO2:  [93 %-100 %] 99 % (02/25 0740) Weight:  [104.3 kg] 104.3 kg (02/25 0411)  Weight change: -1.207 kg Filed Weights   02/22/19 0444 02/23/19 0525 02/24/19 0411  Weight: 97.1 kg 105.5 kg 104.3 kg    Intake/Output: I/O last 3 completed shifts: In: 370.8 [P.O.:240; I.V.:130.8] Out: 1525 [Urine:1525]   Intake/Output this shift:  Total I/O In: 162.5 [P.O.:120; I.V.:42.5] Out: 275 [Urine:275]  Physical Exam: General: No acute distress  Head: Normocephalic, atraumatic. Moist oral mucosal membranes  Eyes: Anicteric  Neck: Supple, trachea midline  Lungs:  Diminished at bases.  Heart: S1S2 no rubs  Abdomen:  Soft, nontender, bowel sounds present  Extremities: 1+ peripheral edema.  Neurologic: Awake, alert, following commands  Skin: No lesions       Basic Metabolic Panel: Recent Labs  Lab 02/18/19 0458  02/20/19 0634 02/21/19 0551 02/22/19 0354 02/23/19 0622 02/24/19 0452  NA 143   < > 138 138 139 139 139  K 3.2*   < > 4.0 4.1 3.9 4.1 3.4*  CL 106   < > 106 108 108 107 107  CO2 25   < > 24 20* 23 20* 23  GLUCOSE 92   < > 113* 113* 97 102* 97  BUN 29*   < > 34* 35* 37* 43* 42*  CREATININE 2.07*   < > 2.23* 2.20* 2.32* 2.65* 2.64*  CALCIUM 9.3   < > 9.0 9.3 9.0 9.2 9.1  MG 1.9  --   --   --   --   --   --    < > = values in this interval not displayed.    Liver Function Tests: Recent Labs  Lab 02/18/19 0458 02/21/19 0551 02/22/19 0354 02/23/19 0622 02/24/19 0452  AST 14*  15 13* 14* 16  ALT 14 18 16 16 19   ALKPHOS 77 74 68 73 70  BILITOT 1.0 1.3* 1.1 0.9 1.0  PROT 6.8 6.8 6.3* 6.6 6.3*  ALBUMIN 3.7 3.5 3.2* 3.3* 3.2*   No results for input(s): LIPASE, AMYLASE in the last 168 hours. No results for input(s): AMMONIA in the last 168 hours.  CBC: Recent Labs  Lab 02/18/19 0458 02/19/19 0407 02/20/19 0634  WBC 8.1 8.1 8.7  HGB 13.4 12.4* 12.9*  HCT 40.9 39.0 40.5  MCV 86.3 88.8 88.6  PLT 188 183 186    Cardiac Enzymes: Recent Labs  Lab 02/17/19 1844 02/17/19 2132 02/18/19 0458 02/18/19 0808  TROPONINI 0.07* 0.08* 0.07* 0.05*    BNP: Invalid input(s): POCBNP  CBG: No results for input(s): GLUCAP in the last 168 hours.  Microbiology: Results for orders placed or performed during the hospital encounter of 02/17/19  Gastrointestinal Panel by PCR , Stool     Status: None   Collection Time: 02/18/19  8:27 PM  Result Value Ref Range Status   Campylobacter species NOT DETECTED NOT DETECTED Final   Plesimonas shigelloides NOT DETECTED NOT DETECTED Final   Salmonella species NOT DETECTED NOT DETECTED Final  Yersinia enterocolitica NOT DETECTED NOT DETECTED Final   Vibrio species NOT DETECTED NOT DETECTED Final   Vibrio cholerae NOT DETECTED NOT DETECTED Final   Enteroaggregative E coli (EAEC) NOT DETECTED NOT DETECTED Final   Enteropathogenic E coli (EPEC) NOT DETECTED NOT DETECTED Final   Enterotoxigenic E coli (ETEC) NOT DETECTED NOT DETECTED Final   Shiga like toxin producing E coli (STEC) NOT DETECTED NOT DETECTED Final   Shigella/Enteroinvasive E coli (EIEC) NOT DETECTED NOT DETECTED Final   Cryptosporidium NOT DETECTED NOT DETECTED Final   Cyclospora cayetanensis NOT DETECTED NOT DETECTED Final   Entamoeba histolytica NOT DETECTED NOT DETECTED Final   Giardia lamblia NOT DETECTED NOT DETECTED Final   Adenovirus F40/41 NOT DETECTED NOT DETECTED Final   Astrovirus NOT DETECTED NOT DETECTED Final   Norovirus GI/GII NOT DETECTED NOT  DETECTED Final   Rotavirus A NOT DETECTED NOT DETECTED Final   Sapovirus (I, II, IV, and V) NOT DETECTED NOT DETECTED Final    Comment: Performed at [facility], [address] [place], [place] [address]    Coagulation Studies: No results for input(s): LABPROT, INR in the last 72 hours.  Urinalysis: No results for input(s): COLORURINE, LABSPEC, PHURINE, GLUCOSEU, HGBUR, BILIRUBINUR, KETONESUR, PROTEINUR, UROBILINOGEN, NITRITE, LEUKOCYTESUR in the last 72 hours.  Invalid input(s): APPERANCEUR    Imaging: Ct Abdomen Pelvis Wo Contrast  Result Date: 02/24/2019 CLINICAL DATA:  Epigastric pain for 4 days. Nausea and vomiting. EXAM: CT ABDOMEN AND PELVIS WITHOUT CONTRAST TECHNIQUE: Multidetector CT imaging of the abdomen and pelvis was performed following the standard protocol without IV contrast. COMPARISON:  04/01/2016 FINDINGS: Lower chest: Heart is enlarged. Small pericardial effusion evident. Tiny left and small right pleural effusions noted. Bibasilar atelectasis noted. Hepatobiliary: Subtle nodularity of liver contour evident with enlargement of the lateral segment left liver, features suggesting cirrhosis. Gallbladder is nondistended with sludge in the lumen dependently. No intrahepatic or extrahepatic biliary dilation. Pancreas: No focal mass lesion. No dilatation of the main duct. No intraparenchymal cyst. No peripancreatic edema. Spleen: No splenomegaly. No focal mass lesion. Adrenals/Urinary Tract: No adrenal nodule or mass. Kidneys unremarkable. No evidence for hydroureter. The urinary bladder appears normal for the degree of distention. Stomach/Bowel: Stomach is unremarkable. No gastric wall thickening. No evidence of outlet obstruction. Duodenum is normally positioned as is the ligament of Treitz. No small bowel wall thickening. No small bowel dilatation. The terminal ileum is normal. The appendix is normal. No gross colonic mass. No colonic wall thickening. Vascular/Lymphatic:  There is abdominal aortic atherosclerosis without aneurysm. There is no gastrohepatic or hepatoduodenal ligament lymphadenopathy. No intraperitoneal or retroperitoneal lymphadenopathy. No pelvic sidewall lymphadenopathy. Reproductive: The prostate gland and seminal vesicles are unremarkable. Other: No intraperitoneal free fluid. Musculoskeletal: No worrisome lytic or sclerotic osseous abnormality. IMPRESSION: 1. No acute findings in the abdomen or pelvis. 2. Subtle nodularity of liver contour suggests cirrhosis. 3. Small pericardial effusion. 4. Tiny left and small right pleural effusions with bibasilar atelectasis. 5. Bilateral nephrolithiasis seen previously has resolved in the interval. Electronically Signed   By: Misty Stanley M.D.   On: 02/24/2019 14:01   Dg Chest Port 1 View  Result Date: 02/23/2019 CLINICAL DATA:  Shortness of breath EXAM: PORTABLE CHEST 1 VIEW COMPARISON:  02/17/2019 FINDINGS: Left AICD remains in place, unchanged. Cardiomegaly with vascular congestion and mild interstitial edema. Left lower lobe opacity may reflect a left effusion and left basilar atelectasis or infiltrate. No visible effusion on the right. No acute bony abnormality. IMPRESSION: Cardiomegaly with  vascular congestion and mild interstitial edema. Left pleural effusion with left lower lobe atelectasis or pneumonia. Electronically Signed   By: Rolm Baptise M.D.   On: 02/23/2019 18:58     Medications:   . sodium chloride 10 mL/hr at 02/24/19 1222  . furosemide (LASIX) infusion 6 mg/hr (02/24/19 1222)   . amiodarone  200 mg Oral Daily  . aspirin EC  81 mg Oral Daily  . atorvastatin  80 mg Oral q1800  . carvedilol  3.125 mg Oral BID WC  . clopidogrel  75 mg Oral Daily  . colchicine  0.6 mg Oral Daily  . diclofenac sodium  4 g Topical QID  . docusate sodium  100 mg Oral BID  . enoxaparin (LOVENOX) injection  40 mg Subcutaneous Q24H  . feeding supplement  1 Container Oral TID BM  . methimazole  50 mg Oral  Daily  . mometasone-formoterol  2 puff Inhalation BID  . pantoprazole  40 mg Oral Daily  . polyethylene glycol  17 g Oral Daily  . sodium chloride flush  3 mL Intravenous Once  . tiotropium  1 capsule Inhalation Daily   sodium chloride, acetaminophen **OR** acetaminophen, bisacodyl, ondansetron **OR** ondansetron (ZOFRAN) IV, promethazine, traMADol  Assessment/ Plan:  53 y.o. male with a PMHX of chronic systolic heart failure ejection fraction 10-15%, chronic kidney disease stage III, prior history of cardiac arrest, hyperlipidemia, nephrolithiasis, hypertension, history of TIA, was admitted to [facility] on 02/17/2019 with nausea, vomiting, and acute renal failure.   1.  Acute renal failure likely secondary to nausea and vomiting. 2.  Chronic kidney disease stage II baseline creatinine 1.4. 3.  Chronic systolic heart failure.  Plan: initially was thought that the patient may have had volume depletion.  However he complained of significant shortness of breath with IV fluid hydration.  He is a very low ejection fraction.  As such we have now started him on Lasix drip at 6 mg per hour.  Followup respiratory status tomorrow.  If renal function continues to decline we may need to considertemporary dialysis but not indicated at the moment.  Await further cardiology input as well.   LOS: 7 [place] 2/25/20203:18 PM

## 2019-02-24 NOTE — Progress Notes (Signed)
[facility] Encounter Note  Patient: Stephen [name] / Admit Date: 02/17/2019 / Date of Encounter: 02/24/2019, 5:46 AM   Subjective: No significant changes in overall condition at this time.  Patient still has slight abdominal bloating and shortness of breath consistent with poor cardiac output congestive heart failure.  Glomerular filtration rate still low with acute kidney injury but trial of recovery by discontinuation of Entresto as well as gentle hydration.  No worsening symptoms of heart failure at this time.  Blood pressure is stable with continuation carvedilol.  No evidence of rhythm disturbances with continued amiodarone use  Review of Systems: Positive for: Shortness of breath Negative for: Vision change, hearing change, syncope, dizziness, nausea, vomiting,diarrhea, bloody stool, stomach pain, cough, congestion, diaphoresis, urinary frequency, urinary pain,skin lesions, skin rashes Others previously listed  Objective: Telemetry: This rhythm Physical Exam: Blood pressure (!) 111/93, pulse 68, temperature 98.1 F (36.7 C), temperature source Oral, resp. rate 18, height 6\' 1"  (1.854 m), weight 104.3 kg, SpO2 93 %. Body mass index is 30.34 kg/m. General: Well developed, well nourished, in no acute distress. Head: Normocephalic, atraumatic, sclera non-icteric, no xanthomas, nares are without discharge. Neck: No apparent masses Lungs: Normal respirations with no wheezes, no rhonchi, basilar rales , some crackles   Heart: Regular rate and rhythm, normal S1 S2, no murmur, no rub, no gallop, PMI is normal size and placement, carotid upstroke normal without bruit, jugular venous pressure normal Abdomen: Soft, non-tender, non-distended with normoactive bowel sounds. No hepatosplenomegaly. Abdominal aorta is normal size without bruit Extremities: Trace edema, no clubbing, no cyanosis, no ulcers,  Peripheral: 2+ radial, 2+ femoral, 2+ dorsal pedal pulses Neuro: Alert  and oriented. Moves all extremities spontaneously. Psych:  Responds to questions appropriately with a normal affect.   Intake/Output Summary (Last 24 hours) at 02/24/2019 0546 Last data filed at 02/24/2019 0210 Gross per 24 hour  Intake 370.8 ml  Output 1075 ml  Net -704.2 ml    Inpatient Medications:  . amiodarone  200 mg Oral Daily  . aspirin EC  81 mg Oral Daily  . atorvastatin  80 mg Oral q1800  . carvedilol  3.125 mg Oral BID WC  . clopidogrel  75 mg Oral Daily  . colchicine  0.6 mg Oral Daily  . diclofenac sodium  4 g Topical QID  . docusate sodium  100 mg Oral BID  . enoxaparin (LOVENOX) injection  40 mg Subcutaneous Q24H  . methimazole  50 mg Oral Daily  . mometasone-formoterol  2 puff Inhalation BID  . pantoprazole  40 mg Oral Daily  . polyethylene glycol  17 g Oral Daily  . sodium chloride flush  3 mL Intravenous Once  . tiotropium  1 capsule Inhalation Daily   Infusions:   Labs: Recent Labs    02/22/19 0354 02/23/19 0622  NA 139 139  K 3.9 4.1  CL 108 107  CO2 23 20*  GLUCOSE 97 102*  BUN 37* 43*  CREATININE 2.32* 2.65*  CALCIUM 9.0 9.2   Recent Labs    02/22/19 0354 02/23/19 0622  AST 13* 14*  ALT 16 16  ALKPHOS 68 73  BILITOT 1.1 0.9  PROT 6.3* 6.6  ALBUMIN 3.2* 3.3*   No results for input(s): WBC, NEUTROABS, HGB, HCT, MCV, PLT in the last 72 hours. No results for input(s): CKTOTAL, CKMB, TROPONINI in the last 72 hours. Invalid input(s): POCBNP No results for input(s): HGBA1C in the last 72 hours.   Weights: [employer]  02/22/19 0444 02/23/19 0525 02/24/19 0411  Weight: 97.1 kg 105.5 kg 104.3 kg     Radiology/Studies:  Dg Chest 2 View  Result Date: 02/17/2019 CLINICAL DATA:  Shortness of breath. EXAM: CHEST - 2 VIEW COMPARISON:  Radiographs of February 16, 2019. FINDINGS: Stable cardiomegaly. Single lead left-sided pacemaker is unchanged in position. Minimal Kerley B lines are noted laterally in right lung base which may represent  minimal edema. Left lung is unremarkable. No pneumothorax or pleural effusion is noted. Bony thorax is unremarkable. IMPRESSION: Possible minimal edema seen in right lung base. Stable cardiomegaly. Electronically Signed   By: Marijo Conception, M.D.   On: 02/17/2019 13:47   Dg Chest 2 View  Result Date: 02/16/2019 CLINICAL DATA:  Dyspnea and vomiting. EXAM: CHEST - 2 VIEW COMPARISON:  02/13/2019 FINDINGS: Stable cardiomegaly. Left-sided AICD device with lead in the right ventricle appears stable. Mild interstitial edema is redemonstrated. No significant effusion or pneumothorax. No significant change from prior. No acute osseous abnormality IMPRESSION: Stable cardiomegaly with mild interstitial edema. Stable cardiomegaly. Mild diffuse interstitial edema. No significant change from prior. Electronically Signed   By: Ashley Royalty M.D.   On: 02/16/2019 01:19   Dg Chest 2 View  Result Date: 02/13/2019 CLINICAL DATA:  Palpitations beginning today. EXAM: CHEST - 2 VIEW COMPARISON:  12/27/2018 FINDINGS: Chronic cardiomegaly. AICD in place. 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 No measurable effusion. No acute bone finding. IMPRESSION: Cardiomegaly, pulmonary venous hypertension and early pulmonary edema. Electronically Signed   By: Nelson Chimes M.D.   On: 02/13/2019 13:35   Dg Ankle Complete Left  Result Date: 02/20/2019 CLINICAL DATA:  Left ankle pain.  History of gout. EXAM: LEFT ANKLE COMPLETE - 3+ VIEW COMPARISON:  None. FINDINGS: There is no evidence of fracture, dislocation, or joint effusion. There is no evidence of arthropathy or other focal bone abnormality of the ankle. Small plantar calcaneal enthesophyte. Soft tissues are unremarkable. IMPRESSION: Normal left ankle. Electronically Signed   By: Lorriane Shire M.D.   On: 02/20/2019 13:44   Dg Abd 1 View  Result Date: 02/18/2019 CLINICAL DATA:  Nausea and vomiting a diarrhea, LEFT lower quadrant abdominal pain EXAM: ABDOMEN - 1  VIEW COMPARISON:  12/09/2018 FINDINGS: ICD lead projects over RIGHT ventricle. Enlargement of cardiac silhouette. Nonobstructive bowel gas pattern. No bowel dilatation or bowel wall thickening. No urinary tract calcification. Degenerative changes of the RIGHT hip joint. Osseous structures otherwise unremarkable. IMPRESSION: No acute abnormalities. Enlargement of cardiac silhouette. Degenerative changes RIGHT hip joint. Electronically Signed   By: Lavonia Dana M.D.   On: 02/18/2019 16:16   US Renal  Result Date: 02/18/2019 CLINICAL DATA:  Acute kidney injury EXAM: RENAL / URINARY TRACT ULTRASOUND COMPLETE COMPARISON:  CT abdomen and pelvis 04/01/2016 FINDINGS: Right Kidney: Renal measurements: 10.3 x 5.7 x 5.3 cm = volume: 164 mL. Normal cortical thickness. Increased cortical echogenicity. No mass or hydronephrosis. 5 mm nonobstructing calculus mid RIGHT kidney. Left Kidney: Renal measurements: 10.8 x 5.6 x 5.8 cm = volume: 185 mL. Normal cortical thickness. Increased cortical echogenicity. No mass, hydronephrosis or shadowing calcification. Bladder: Appears normal for degree of bladder distention. BILATERAL ureteral jets visualized. IMPRESSION: Medical renal disease changes of both kidneys. 5 mm nonobstructing RIGHT renal calculus. No renal mass or hydronephrosis. Electronically Signed   By: Lavonia Dana M.D.   On: 02/18/2019 16:20   US Abdomen Limited  Result Date: 02/20/2019 CLINICAL DATA:  Abdomen distension.  Evaluate for ascites EXAM: LIMITED ABDOMEN  ULTRASOUND FOR ASCITES TECHNIQUE: Limited ultrasound survey for ascites was performed in all four abdominal quadrants. COMPARISON:  None. FINDINGS: No evidence of ascites. IMPRESSION: Negative Electronically Signed   By: Kerby Moors M.D.   On: 02/20/2019 12:13   Dg Chest Port 1 View  Result Date: 02/23/2019 CLINICAL DATA:  Shortness of breath EXAM: PORTABLE CHEST 1 VIEW COMPARISON:  02/17/2019 FINDINGS: Left AICD remains in place, unchanged.  Cardiomegaly with vascular congestion and mild interstitial edema. Left lower lobe opacity may reflect a left effusion and left basilar atelectasis or infiltrate. No visible effusion on the right. No acute bony abnormality. IMPRESSION: Cardiomegaly with vascular congestion and mild interstitial edema. Left pleural effusion with left lower lobe atelectasis or pneumonia. Electronically Signed   By: Rolm Baptise M.D.   On: 02/23/2019 18:58   US Abdomen Limited Ruq  Result Date: 02/20/2019 CLINICAL DATA:  Right upper quadrant pain for 6 days. EXAM: ULTRASOUND ABDOMEN LIMITED RIGHT UPPER QUADRANT COMPARISON:  Body CT 04/01/2016 FINDINGS: Gallbladder: No gallstones or wall thickening visualized. No sonographic Murphy sign noted by sonographer. Common bile duct: Diameter: 2.3 mm Liver: No focal lesion identified. Mildly increased parenchymal echogenicity. Portal vein is patent on color Doppler imaging with normal direction of blood flow towards the liver. IMPRESSION: No evidence of cholelithiasis or acute cholecystitis. Electronically Signed   By: Fidela Salisbury M.D.   On: 02/20/2019 12:12     Assessment and Recommendation  53 y.o. male with known severe dilated cardiomyopathy with ejection fraction of 15% hypertension coronary atherosclerosis rhythm disturbances with acute acute on chronic systolic dysfunction congestive heart failure with "poor cardiac output and acute kidney injury slight improvements with appropriate medication management 1.  Continue abstinence of use of Entresto due to acute "kidney injury and concerns for poor cardiac output 2.  No further cardiac intervention or diagnostics necessary at this time 3.  Begin ambulation and follow-up for further significant symptoms and need for adjustments of medication management 4.  Continue beta-blocker for cardiomyopathy as long as blood pressure can tolerate 5.  No change in amiodarone for rhythm disturbances stable at this time 6.  Further  consideration of advanced intervention for cardiomyopathy if continued difficulty after above  Signed, Serafina Royals M.D. FACC

## 2019-02-24 NOTE — Progress Notes (Signed)
[facility] [place] NAME: Stephen [name]    MR#:  [id number]  DATE OF BIRTH:  1966-01-22  SUBJECTIVE:   Patient denies any vomiting today wants his diet to be advanced  Patient reporting abdominal swelling  REVIEW OF SYSTEMS:  Review of Systems  Constitutional: Negative for chills and fever.  HENT: Negative for congestion and sore throat.   Eyes: Negative for blurred vision and double vision.  Respiratory: Positive for shortness of breath. Negative for cough.   Cardiovascular: Negative for chest pain and palpitations.  Gastrointestinal: Negative for abdominal pain, diarrhea, nausea and vomiting.  Genitourinary: Negative for dysuria and urgency.  Musculoskeletal: Negative for back pain and neck pain.  Neurological: Negative for dizziness and headaches.  Psychiatric/Behavioral: Negative for depression. The patient is not nervous/anxious.     DRUG ALLERGIES:   Allergies  Allergen Reactions  . Lisinopril Cough   VITALS:  Blood pressure 106/69, pulse 67, temperature (!) 97.5 F (36.4 C), temperature source Oral, resp. rate 18, height 6\' 1"  (1.854 m), weight 104.3 kg, SpO2 99 %. PHYSICAL EXAMINATION:  Physical Exam  GENERAL:  53 y.o.-year-old patient lying in the bed with no acute distress.  EYES: Pupils equal, round, reactive to light and accommodation. No scleral icterus. Extraocular muscles intact.  HEENT: Head atraumatic, normocephalic. Oropharynx and nasopharynx clear.    Moist mucous membranes NECK:  Supple, no jugular venous distention. No thyroid enlargement, no tenderness.  LUNGS: Normal breath sounds bilaterally, no wheezing, rales,rhonchi or crepitation. No use of accessory muscles of respiration.  CARDIOVASCULAR: RRR, S1, S2 normal. No murmurs, rubs, or gallops.  ABDOMEN: Soft, + Mild distention, ?fluid wave. bowel sounds present.  EXTREMITIES: No pedal edema, cyanosis, or clubbing. +left ankle is tender to palpation  without erythema or warmth. NEUROLOGIC: CN 2-12 grossly normal, no focal deficits. sensation intact. Gait not checked.  PSYCHIATRIC: The patient is alert and oriented x 3.  SKIN: No obvious rash, lesion, or ulcer.  LABORATORY PANEL:  Male CBC Recent Labs  Lab 02/20/19 0634  WBC 8.7  HGB 12.9*  HCT 40.5  PLT 186   ------------------------------------------------------------------------------------------------------------------ Chemistries  Recent Labs  Lab 02/18/19 0458  02/24/19 0452  NA 143   < > 139  K 3.2*   < > 3.4*  CL 106   < > 107  CO2 25   < > 23  GLUCOSE 92   < > 97  BUN 29*   < > 42*  CREATININE 2.07*   < > 2.64*  CALCIUM 9.3   < > 9.1  MG 1.9  --   --   AST 14*   < > 16  ALT 14   < > 19  ALKPHOS 77   < > 70  BILITOT 1.0   < > 1.0   < > = values in this interval not displayed.   RADIOLOGY:  Ct Abdomen Pelvis Wo Contrast  Result Date: 02/24/2019 CLINICAL DATA:  Epigastric pain for 4 days. Nausea and vomiting. EXAM: CT ABDOMEN AND PELVIS WITHOUT CONTRAST TECHNIQUE: Multidetector CT imaging of the abdomen and pelvis was performed following the standard protocol without IV contrast. COMPARISON:  04/01/2016 FINDINGS: Lower chest: Heart is enlarged. Small pericardial effusion evident. Tiny left and small right pleural effusions noted. Bibasilar atelectasis noted. Hepatobiliary: Subtle nodularity of liver contour evident with enlargement of the lateral segment left liver, features suggesting cirrhosis. Gallbladder is nondistended with sludge in the lumen dependently. No intrahepatic or extrahepatic  biliary dilation. Pancreas: No focal mass lesion. No dilatation of the main duct. No intraparenchymal cyst. No peripancreatic edema. Spleen: No splenomegaly. No focal mass lesion. Adrenals/Urinary Tract: No adrenal nodule or mass. Kidneys unremarkable. No evidence for hydroureter. The urinary bladder appears normal for the degree of distention. Stomach/Bowel: Stomach is  unremarkable. No gastric wall thickening. No evidence of outlet obstruction. Duodenum is normally positioned as is the ligament of Treitz. No small bowel wall thickening. No small bowel dilatation. The terminal ileum is normal. The appendix is normal. No gross colonic mass. No colonic wall thickening. Vascular/Lymphatic: There is abdominal aortic atherosclerosis without aneurysm. There is no gastrohepatic or hepatoduodenal ligament lymphadenopathy. No intraperitoneal or retroperitoneal lymphadenopathy. No pelvic sidewall lymphadenopathy. Reproductive: The prostate gland and seminal vesicles are unremarkable. Other: No intraperitoneal free fluid. Musculoskeletal: No worrisome lytic or sclerotic osseous abnormality. IMPRESSION: 1. No acute findings in the abdomen or pelvis. 2. Subtle nodularity of liver contour suggests cirrhosis. 3. Small pericardial effusion. 4. Tiny left and small right pleural effusions with bibasilar atelectasis. 5. Bilateral nephrolithiasis seen previously has resolved in the interval. Electronically Signed   By: Misty Stanley M.D.   On: 02/24/2019 14:01   Dg Chest Port 1 View  Result Date: 02/23/2019 CLINICAL DATA:  Shortness of breath EXAM: PORTABLE CHEST 1 VIEW COMPARISON:  02/17/2019 FINDINGS: Left AICD remains in place, unchanged. Cardiomegaly with vascular congestion and mild interstitial edema. Left lower lobe opacity may reflect a left effusion and left basilar atelectasis or infiltrate. No visible effusion on the right. No acute bony abnormality. IMPRESSION: Cardiomegaly with vascular congestion and mild interstitial edema. Left pleural effusion with left lower lobe atelectasis or pneumonia. Electronically Signed   By: Rolm Baptise M.D.   On: 02/23/2019 18:58   ASSESSMENT AND PLAN:   Acute kidney injury on chronic kidney disease- likely due to nausea, vomiting, diarrhea.   Creatinine is getting worse 2.20-2.32-2.65--2.64  previously it was at 1.6-2.0 -Renal ultrasound with  medical renal disease -Patient is started on Lasix drip -with ejection fraction 10 to 15% -Holding entresto, torsemide, metolazone -Avoid nephrotoxins -Nephrology Dr. Zollie Scale is following  Nausea/vomiting/diarrhea-clinically not improving  abdominal x-ray unremarkable.   -Diarrhea resolved -CT abdomen and pelvis ordered -GI consult placed, seen by Dr. Vicente Males, recommending conservative management in view of his cardiomyopathy with ejection fraction 10 to 15% -GI pathogen panel negative -Clear liquid diet -PPI -Gentle IV fluids provided, discontinued currently -IV antiemetics -X-ray and abdominal ultrasound with no acute findings   Left ankle pain- patient states that this feels like his regular gout.  No erythema, edema, or warmth on exam.  Patient has full range of motion. -Continue colchicine -Add heating pad and Voltaren gel, as patient states this usually helps him a lot. -Normal left ankle x-ray  Elevated troponin- likely demand ischemia in the setting of AKI.  Troponins mildly elevated and flat.  No active chest pain. 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 Nehemiah Massed is following -Holding entresto, torsemide, metolazone -Continue aspirin, Plavix, Coreg, amiodarone -Will need to watch respiratory status closely while giving fluids  Chronic COPD- currently with no exacerbation -Continue home inhalers -DuoNebs PRN  History of hyperthyroidism- TSH undetectable, T4 mildly elevated, T3 normal. -Increase methimazole to 50 mg daily -Needs to follow-up with endocrinology on discharge  All the records are reviewed and case discussed with Care  Management/Social Worker. Management plans discussed with the patient, he is in agreement.  CODE STATUS: Full Code  TOTAL TIME TAKING CARE OF THIS  PATIENT: 35 minutes.   More than 50% of the time was spent in counseling/coordination of care: YES  POSSIBLE D/C IN 1-2 DAYS, DEPENDING ON CLINICAL CONDITION.   [name] M.D on 02/24/2019 at 2:53 PM  Between 7am to 6pm - Pager - [phone] After 6pm go to [email or web address] - Proofreader  [employer] [facility] [facility]  Office  [phone]  CC: Primary care physician; Valerie Roys, DO  Note: This dictation was prepared with Dragon dictation along with smaller phrase technology. Any transcriptional errors that result from this process are unintentional.

## 2019-02-24 NOTE — Plan of Care (Signed)
  Problem: Activity: Goal: Risk for activity intolerance will decrease Outcome: Progressing Note:  Independent in room   Problem: Elimination: Goal: Will not experience complications related to urinary retention Outcome: Progressing   Problem: Pain Managment: Goal: General experience of comfort will improve Outcome: Progressing Note:  No complaints of pain this shift   Problem: Safety: Goal: Ability to remain free from injury will improve Outcome: Progressing   Problem: Skin Integrity: Goal: Risk for impaired skin integrity will decrease Outcome: Progressing

## 2019-02-25 ENCOUNTER — Inpatient Hospital Stay: Payer: Commercial Managed Care - PPO

## 2019-02-25 LAB — PROTEIN ELECTROPHORESIS, SERUM
A/G Ratio: 1 (ref 0.7–1.7)
Albumin ELP: 2.9 g/dL (ref 2.9–4.4)
Alpha-1-Globulin: 0.3 g/dL (ref 0.0–0.4)
Alpha-2-Globulin: 0.9 g/dL (ref 0.4–1.0)
Beta Globulin: 0.8 g/dL (ref 0.7–1.3)
Gamma Globulin: 1 g/dL (ref 0.4–1.8)
Globulin, Total: 2.9 g/dL (ref 2.2–3.9)
Total Protein ELP: 5.8 g/dL — ABNORMAL LOW (ref 6.0–8.5)

## 2019-02-25 LAB — ECHOCARDIOGRAM COMPLETE
Height: 73 in
WEIGHTICAEL: 3679.04 [oz_av]

## 2019-02-25 LAB — BASIC METABOLIC PANEL
ANION GAP: 10 (ref 5–15)
BUN: 42 mg/dL — ABNORMAL HIGH (ref 6–20)
CO2: 24 mmol/L (ref 22–32)
Calcium: 9.1 mg/dL (ref 8.9–10.3)
Chloride: 107 mmol/L (ref 98–111)
Creatinine, Ser: 2.48 mg/dL — ABNORMAL HIGH (ref 0.61–1.24)
GFR calc Af Amer: 33 mL/min — ABNORMAL LOW (ref >60)
GFR calc non Af Amer: 29 mL/min — ABNORMAL LOW (ref >60)
GLUCOSE: 107 mg/dL — AB (ref 70–99)
Potassium: 3.4 mmol/L — ABNORMAL LOW (ref 3.5–5.1)
Sodium: 141 mmol/L (ref 135–145)

## 2019-02-25 MED ORDER — POTASSIUM CHLORIDE CRYS ER 20 MEQ PO TBCR
20.0000 meq | EXTENDED_RELEASE_TABLET | Freq: Once | ORAL | Status: AC
  Administered 2019-02-25: 20 meq via ORAL
  Filled 2019-02-25: qty 1

## 2019-02-25 NOTE — Progress Notes (Signed)
[facility]  ROUNDING NOTE   Subjective:  Patient reports feeling better after having been started on Lasix drip. However urine output documented was only 950 cc. Patient resting comfortably at the moment.  Objective:  Vital signs in last 24 hours:  Temp:  [98.1 F (36.7 C)-98.5 F (36.9 C)] 98.1 F (36.7 C) (02/26 0852) Pulse Rate:  [64-88] 64 (02/26 0852) Resp:  [18-22] 18 (02/26 0852) BP: (116-122)/(76-95) 118/95 (02/26 0852) SpO2:  [94 %-97 %] 94 % (02/26 0852) Weight:  [104.5 kg] 104.5 kg (02/26 0506)  Weight change: 0.209 kg Filed Weights   02/23/19 0525 02/24/19 0411 02/25/19 0506  Weight: 105.5 kg 104.3 kg 104.5 kg    Intake/Output: I/O last 3 completed shifts: In: 428.9 [P.O.:120; I.V.:308.9] Out: 1725 [Urine:1725]   Intake/Output this shift:  No intake/output data recorded.  Physical Exam: General: No acute distress  Head: Normocephalic, atraumatic. Moist oral mucosal membranes  Eyes: Anicteric  Neck: Supple, trachea midline  Lungs:  Diminished at bases.  Heart: S1S2 no rubs  Abdomen:  Soft, nontender, bowel sounds present  Extremities: 1+ peripheral edema.  Neurologic: Awake, alert, following commands  Skin: No lesions       Basic Metabolic Panel: Recent Labs  Lab 02/21/19 0551 02/22/19 0354 02/23/19 0622 02/24/19 0452 02/25/19 0449  NA 138 139 139 139 141  K 4.1 3.9 4.1 3.4* 3.4*  CL 108 108 107 107 107  CO2 20* 23 20* 23 24  GLUCOSE 113* 97 102* 97 107*  BUN 35* 37* 43* 42* 42*  CREATININE 2.20* 2.32* 2.65* 2.64* 2.48*  CALCIUM 9.3 9.0 9.2 9.1 9.1    Liver Function Tests: Recent Labs  Lab 02/21/19 0551 02/22/19 0354 02/23/19 0622 02/24/19 0452  AST 15 13* 14* 16  ALT 18 16 16 19   ALKPHOS 74 68 73 70  BILITOT 1.3* 1.1 0.9 1.0  PROT 6.8 6.3* 6.6 6.3*  ALBUMIN 3.5 3.2* 3.3* 3.2*   No results for input(s): LIPASE, AMYLASE in the last 168 hours. No results for input(s): AMMONIA in the last 168  hours.  CBC: Recent Labs  Lab 02/19/19 0407 02/20/19 0634  WBC 8.1 8.7  HGB 12.4* 12.9*  HCT 39.0 40.5  MCV 88.8 88.6  PLT 183 186    Cardiac Enzymes: No results for input(s): CKTOTAL, CKMB, CKMBINDEX, TROPONINI in the last 168 hours.  BNP: Invalid input(s): POCBNP  CBG: No results for input(s): GLUCAP in the last 168 hours.  Microbiology: Results for orders placed or performed during the hospital encounter of 02/17/19  Gastrointestinal Panel by PCR , Stool     Status: None   Collection Time: 02/18/19  8:27 PM  Result Value Ref Range Status   Campylobacter species NOT DETECTED NOT DETECTED Final   Plesimonas shigelloides NOT DETECTED NOT DETECTED Final   Salmonella species NOT DETECTED NOT DETECTED Final   Yersinia enterocolitica NOT DETECTED NOT DETECTED Final   Vibrio species NOT DETECTED NOT DETECTED Final   Vibrio cholerae NOT DETECTED NOT DETECTED Final   Enteroaggregative E coli (EAEC) NOT DETECTED NOT DETECTED Final   Enteropathogenic E coli (EPEC) NOT DETECTED NOT DETECTED Final   Enterotoxigenic E coli (ETEC) NOT DETECTED NOT DETECTED Final   Shiga like toxin producing E coli (STEC) NOT DETECTED NOT DETECTED Final   Shigella/Enteroinvasive E coli (EIEC) NOT DETECTED NOT DETECTED Final   Cryptosporidium NOT DETECTED NOT DETECTED Final   Cyclospora cayetanensis NOT DETECTED NOT DETECTED Final   Entamoeba histolytica NOT DETECTED NOT  DETECTED Final   Giardia lamblia NOT DETECTED NOT DETECTED Final   Adenovirus F40/41 NOT DETECTED NOT DETECTED Final   Astrovirus NOT DETECTED NOT DETECTED Final   Norovirus GI/GII NOT DETECTED NOT DETECTED Final   Rotavirus A NOT DETECTED NOT DETECTED Final   Sapovirus (I, II, IV, and V) NOT DETECTED NOT DETECTED Final    Comment: Performed at [facility], [address] [place], [place] [address]    Coagulation Studies: No results for input(s): LABPROT, INR in the last 72 hours.  Urinalysis: No results for  input(s): COLORURINE, LABSPEC, PHURINE, GLUCOSEU, HGBUR, BILIRUBINUR, KETONESUR, PROTEINUR, UROBILINOGEN, NITRITE, LEUKOCYTESUR in the last 72 hours.  Invalid input(s): APPERANCEUR    Imaging: Ct Abdomen Pelvis Wo Contrast  Result Date: 02/24/2019 CLINICAL DATA:  Epigastric pain for 4 days. Nausea and vomiting. EXAM: CT ABDOMEN AND PELVIS WITHOUT CONTRAST TECHNIQUE: Multidetector CT imaging of the abdomen and pelvis was performed following the standard protocol without IV contrast. COMPARISON:  04/01/2016 FINDINGS: Lower chest: Heart is enlarged. Small pericardial effusion evident. Tiny left and small right pleural effusions noted. Bibasilar atelectasis noted. Hepatobiliary: Subtle nodularity of liver contour evident with enlargement of the lateral segment left liver, features suggesting cirrhosis. Gallbladder is nondistended with sludge in the lumen dependently. No intrahepatic or extrahepatic biliary dilation. Pancreas: No focal mass lesion. No dilatation of the main duct. No intraparenchymal cyst. No peripancreatic edema. Spleen: No splenomegaly. No focal mass lesion. Adrenals/Urinary Tract: No adrenal nodule or mass. Kidneys unremarkable. No evidence for hydroureter. The urinary bladder appears normal for the degree of distention. Stomach/Bowel: Stomach is unremarkable. No gastric wall thickening. No evidence of outlet obstruction. Duodenum is normally positioned as is the ligament of Treitz. No small bowel wall thickening. No small bowel dilatation. The terminal ileum is normal. The appendix is normal. No gross colonic mass. No colonic wall thickening. Vascular/Lymphatic: There is abdominal aortic atherosclerosis without aneurysm. There is no gastrohepatic or hepatoduodenal ligament lymphadenopathy. No intraperitoneal or retroperitoneal lymphadenopathy. No pelvic sidewall lymphadenopathy. Reproductive: The prostate gland and seminal vesicles are unremarkable. Other: No intraperitoneal free fluid.  Musculoskeletal: No worrisome lytic or sclerotic osseous abnormality. IMPRESSION: 1. No acute findings in the abdomen or pelvis. 2. Subtle nodularity of liver contour suggests cirrhosis. 3. Small pericardial effusion. 4. Tiny left and small right pleural effusions with bibasilar atelectasis. 5. Bilateral nephrolithiasis seen previously has resolved in the interval. Electronically Signed   By: Misty Stanley M.D.   On: 02/24/2019 14:01   Dg Chest Port 1 View  Result Date: 02/23/2019 CLINICAL DATA:  Shortness of breath EXAM: PORTABLE CHEST 1 VIEW COMPARISON:  02/17/2019 FINDINGS: Left AICD remains in place, unchanged. Cardiomegaly with vascular congestion and mild interstitial edema. Left lower lobe opacity may reflect a left effusion and left basilar atelectasis or infiltrate. No visible effusion on the right. No acute bony abnormality. IMPRESSION: Cardiomegaly with vascular congestion and mild interstitial edema. Left pleural effusion with left lower lobe atelectasis or pneumonia. Electronically Signed   By: Rolm Baptise M.D.   On: 02/23/2019 18:58     Medications:   . sodium chloride 10 mL/hr at 02/24/19 1241  . furosemide (LASIX) infusion 6 mg/hr (02/24/19 1241)   . amiodarone  200 mg Oral Daily  . aspirin EC  81 mg Oral Daily  . atorvastatin  80 mg Oral q1800  . carvedilol  3.125 mg Oral BID WC  . clopidogrel  75 mg Oral Daily  . colchicine  0.6 mg Oral Daily  .  diclofenac sodium  4 g Topical QID  . docusate sodium  100 mg Oral BID  . enoxaparin (LOVENOX) injection  40 mg Subcutaneous Q24H  . feeding supplement  1 Container Oral TID BM  . methimazole  50 mg Oral Daily  . mometasone-formoterol  2 puff Inhalation BID  . pantoprazole  40 mg Oral Daily  . polyethylene glycol  17 g Oral Daily  . sodium chloride flush  3 mL Intravenous Once  . tiotropium  1 capsule Inhalation Daily   sodium chloride, acetaminophen **OR** acetaminophen, bisacodyl, ondansetron **OR** ondansetron (ZOFRAN) IV,  promethazine  Assessment/ Plan:  53 y.o. male with a PMHX of chronic systolic heart failure ejection fraction 10-15%, chronic kidney disease stage III, prior history of cardiac arrest, hyperlipidemia, nephrolithiasis, hypertension, history of TIA, was admitted to [facility] on 02/17/2019 with nausea, vomiting, and acute renal failure.   1.  Acute renal failure likely secondary to nausea and vomiting. 2.  Chronic kidney disease stage II baseline creatinine 1.4. 3.  Chronic systolic heart failure.  Plan: Clinically the patient states that he feels better after having been started on Lasix drip.  He has a very low ejection fraction.  He states that his shortness of breath is improved with the diuresis that has been performed thus far.  We will maintain the patient on Lasix drip for 1 additional day.  He was on torsemide previously.  Continue to monitor renal parameters closely.   LOS: 8 Hareem Surowiec 2/26/202012:09 PM

## 2019-02-25 NOTE — Progress Notes (Signed)
Stephen [name] , MD [address], [place], [place], [place], [address] [address], [place], [place], [place], [address] Phone: [phone]  Fax: [phone]   Stephen [name] is being followed for nausea  Day 2 of follow up   Subjective: Still has nausea   Objective: Vital signs in last 24 hours: Vitals:   02/24/19 1954 02/25/19 0447 02/25/19 0506 02/25/19 0852  BP: 120/76 122/89  (!) 118/95  Pulse: 88 74  64  Resp: 18 (!) 22  18  Temp: 98.5 F (36.9 C) 98.4 F (36.9 C)  98.1 F (36.7 C)  TempSrc: Oral Oral  Oral  SpO2: 94% 97%  94%  Weight:   104.5 kg   Height:       Weight change: 0.209 kg  Intake/Output Summary (Last 24 hours) at 02/25/2019 1322 Last data filed at 02/25/2019 1217 Gross per 24 hour  Intake 266.46 ml  Output 925 ml  Net -658.54 ml     Exam: Heart:: Regular rate and rhythm, S1S2 present or without murmur or extra heart sounds Lungs: normal, clear to auscultation and clear to auscultation and percussion Abdomen: soft, nontender, normal bowel sounds   Lab Results: @LABTEST2 @ Micro Results: Recent Results (from the past 240 hour(s))  Gastrointestinal Panel by PCR , Stool     Status: None   Collection Time: 02/18/19  8:27 PM  Result Value Ref Range Status   Campylobacter species NOT DETECTED NOT DETECTED Final   Plesimonas shigelloides NOT DETECTED NOT DETECTED Final   Salmonella species NOT DETECTED NOT DETECTED Final   Yersinia enterocolitica NOT DETECTED NOT DETECTED Final   Vibrio species NOT DETECTED NOT DETECTED Final   Vibrio cholerae NOT DETECTED NOT DETECTED Final   Enteroaggregative E coli (EAEC) NOT DETECTED NOT DETECTED Final   Enteropathogenic E coli (EPEC) NOT DETECTED NOT DETECTED Final   Enterotoxigenic E coli (ETEC) NOT DETECTED NOT DETECTED Final   Shiga like toxin producing E coli (STEC) NOT DETECTED NOT DETECTED Final   Shigella/Enteroinvasive E coli (EIEC) NOT DETECTED NOT DETECTED Final   Cryptosporidium NOT  DETECTED NOT DETECTED Final   Cyclospora cayetanensis NOT DETECTED NOT DETECTED Final   Entamoeba histolytica NOT DETECTED NOT DETECTED Final   Giardia lamblia NOT DETECTED NOT DETECTED Final   Adenovirus F40/41 NOT DETECTED NOT DETECTED Final   Astrovirus NOT DETECTED NOT DETECTED Final   Norovirus GI/GII NOT DETECTED NOT DETECTED Final   Rotavirus A NOT DETECTED NOT DETECTED Final   Sapovirus (I, II, IV, and V) NOT DETECTED NOT DETECTED Final    Comment: Performed at [facility], [address], [place], [place] [address]   Studies/Results: Ct Abdomen Pelvis Wo Contrast  Result Date: 02/24/2019 CLINICAL DATA:  Epigastric pain for 4 days. Nausea and vomiting. EXAM: CT ABDOMEN AND PELVIS WITHOUT CONTRAST TECHNIQUE: Multidetector CT imaging of the abdomen and pelvis was performed following the standard protocol without IV contrast. COMPARISON:  04/01/2016 FINDINGS: Lower chest: Heart is enlarged. Small pericardial effusion evident. Tiny left and small right pleural effusions noted. Bibasilar atelectasis noted. Hepatobiliary: Subtle nodularity of liver contour evident with enlargement of the lateral segment left liver, features suggesting cirrhosis. Gallbladder is nondistended with sludge in the lumen dependently. No intrahepatic or extrahepatic biliary dilation. Pancreas: No focal mass lesion. No dilatation of the main duct. No intraparenchymal cyst. No peripancreatic edema. Spleen: No splenomegaly. No focal mass lesion. Adrenals/Urinary Tract: No adrenal nodule or mass. Kidneys unremarkable. No evidence for hydroureter. The urinary bladder  appears normal for the degree of distention. Stomach/Bowel: Stomach is unremarkable. No gastric wall thickening. No evidence of outlet obstruction. Duodenum is normally positioned as is the ligament of Treitz. No small bowel wall thickening. No small bowel dilatation. The terminal ileum is normal. The appendix is normal. No gross colonic mass. No colonic  wall thickening. Vascular/Lymphatic: There is abdominal aortic atherosclerosis without aneurysm. There is no gastrohepatic or hepatoduodenal ligament lymphadenopathy. No intraperitoneal or retroperitoneal lymphadenopathy. No pelvic sidewall lymphadenopathy. Reproductive: The prostate gland and seminal vesicles are unremarkable. Other: No intraperitoneal free fluid. Musculoskeletal: No worrisome lytic or sclerotic osseous abnormality. IMPRESSION: 1. No acute findings in the abdomen or pelvis. 2. Subtle nodularity of liver contour suggests cirrhosis. 3. Small pericardial effusion. 4. Tiny left and small right pleural effusions with bibasilar atelectasis. 5. Bilateral nephrolithiasis seen previously has resolved in the interval. Electronically Signed   By: Misty Stanley M.D.   On: 02/24/2019 14:01   Dg Chest Port 1 View  Result Date: 02/23/2019 CLINICAL DATA:  Shortness of breath EXAM: PORTABLE CHEST 1 VIEW COMPARISON:  02/17/2019 FINDINGS: Left AICD remains in place, unchanged. Cardiomegaly with vascular congestion and mild interstitial edema. Left lower lobe opacity may reflect a left effusion and left basilar atelectasis or infiltrate. No visible effusion on the right. No acute bony abnormality. IMPRESSION: Cardiomegaly with vascular congestion and mild interstitial edema. Left pleural effusion with left lower lobe atelectasis or pneumonia. Electronically Signed   By: Rolm Baptise M.D.   On: 02/23/2019 18:58   Medications: I have reviewed the patient's current medications. Scheduled Meds: . amiodarone  200 mg Oral Daily  . aspirin EC  81 mg Oral Daily  . atorvastatin  80 mg Oral q1800  . carvedilol  3.125 mg Oral BID WC  . clopidogrel  75 mg Oral Daily  . colchicine  0.6 mg Oral Daily  . diclofenac sodium  4 g Topical QID  . docusate sodium  100 mg Oral BID  . enoxaparin (LOVENOX) injection  40 mg Subcutaneous Q24H  . feeding supplement  1 Container Oral TID BM  . methimazole  50 mg Oral Daily  .  mometasone-formoterol  2 puff Inhalation BID  . pantoprazole  40 mg Oral Daily  . polyethylene glycol  17 g Oral Daily  . sodium chloride flush  3 mL Intravenous Once  . tiotropium  1 capsule Inhalation Daily   Continuous Infusions: . sodium chloride 10 mL/hr at 02/24/19 1241  . furosemide (LASIX) infusion 6 mg/hr (02/24/19 1241)   PRN Meds:.sodium chloride, acetaminophen **OR** acetaminophen, bisacodyl, ondansetron **OR** ondansetron (ZOFRAN) IV, promethazine   Assessment: Active Problems:   AKI (acute kidney injury) ([place])   Stephen [name] 53 y.o. male consulted for nausea. No luminal obstruction per CT scan of the abdomen . Heart failure too can contribute to nausea   Plan: 1. Continue antiemetics, add compazine . EGD with anesthesia is high risk and will not change management 2. PPI 3. If still persists in 24-48 hours consider MRI brain to r/o space occupying lesions in posterior fossa vs brain stem.  4. D/c tramadol as narcotics slow down GI motility  5. If not had a bowel movement suggest miralax daily .    LOS: 8 days   Stephen Bellows, MD 02/25/2019, 1:22 PM

## 2019-02-25 NOTE — Plan of Care (Signed)
  Problem: Activity: Goal: Risk for activity intolerance will decrease Outcome: Progressing Note:  Up independently in room using urinal   Problem: Elimination: Goal: Will not experience complications related to urinary retention Outcome: Progressing Note:  On lasix gtt   Problem: Pain Managment: Goal: General experience of comfort will improve Outcome: Progressing   Problem: Safety: Goal: Ability to remain free from injury will improve Outcome: Progressing   Problem: Skin Integrity: Goal: Risk for impaired skin integrity will decrease Outcome: Progressing   Problem: Nutrition: Goal: Adequate nutrition will be maintained Outcome: Not Progressing Note:  Unable to tolerate solid foods- having nausea/vomiting needing zofran last night   Problem: Coping: Goal: Level of anxiety will decrease Outcome: Completed/Met

## 2019-02-25 NOTE — Progress Notes (Signed)
[facility] [place] NAME: Stephen [name]    MR#:  [id number]  DATE OF BIRTH:  Sep 21, 1966  SUBJECTIVE:   Patient reports vomiting x2 and nausea today  abdominal swelling is getting better  REVIEW OF SYSTEMS:  Review of Systems  Constitutional: Negative for chills and fever.  HENT: Negative for congestion and sore throat.   Eyes: Negative for blurred vision and double vision.  Respiratory: Negative for cough and shortness of breath.   Cardiovascular: Negative for chest pain and palpitations.  Gastrointestinal: Positive for nausea and vomiting. Negative for abdominal pain and diarrhea.  Genitourinary: Negative for dysuria and urgency.  Musculoskeletal: Negative for back pain and neck pain.  Neurological: Negative for dizziness and headaches.  Psychiatric/Behavioral: Negative for depression. The patient is not nervous/anxious.     DRUG ALLERGIES:   Allergies  Allergen Reactions  . Lisinopril Cough   VITALS:  Blood pressure (!) 118/95, pulse 64, temperature 98.1 F (36.7 C), temperature source Oral, resp. rate 18, height 6\' 1"  (1.854 m), weight 104.5 kg, SpO2 94 %. PHYSICAL EXAMINATION:  Physical Exam  GENERAL:  53 y.o.-year-old patient lying in the bed with no acute distress.  EYES: Pupils equal, round, reactive to light and accommodation. No scleral icterus. Extraocular muscles intact.  HEENT: Head atraumatic, normocephalic. Oropharynx and nasopharynx clear.    Moist mucous membranes NECK:  Supple, no jugular venous distention. No thyroid enlargement, no tenderness.  LUNGS: Normal breath sounds bilaterally, no wheezing, rales,rhonchi or crepitation. No use of accessory muscles of respiration.  CARDIOVASCULAR: RRR, S1, S2 normal. No murmurs, rubs, or gallops.  ABDOMEN: Soft, + Mild distention, ?fluid wave. bowel sounds present.  EXTREMITIES: No pedal edema, cyanosis, or clubbing. +left ankle is tender to palpation without erythema  or warmth. NEUROLOGIC: CN 2-12 grossly normal, no focal deficits. sensation intact. Gait not checked.  PSYCHIATRIC: The patient is alert and oriented x 3.  SKIN: No obvious rash, lesion, or ulcer.  LABORATORY PANEL:  Male CBC Recent Labs  Lab 02/20/19 0634  WBC 8.7  HGB 12.9*  HCT 40.5  PLT 186   ------------------------------------------------------------------------------------------------------------------ Chemistries  Recent Labs  Lab 02/24/19 0452 02/25/19 0449  NA 139 141  K 3.4* 3.4*  CL 107 107  CO2 23 24  GLUCOSE 97 107*  BUN 42* 42*  CREATININE 2.64* 2.48*  CALCIUM 9.1 9.1  AST 16  --   ALT 19  --   ALKPHOS 70  --   BILITOT 1.0  --    RADIOLOGY:  US Abdomen Limited  Result Date: 02/25/2019 CLINICAL DATA:  Abdominal distention for 4 days EXAM: LIMITED ABDOMEN ULTRASOUND FOR ASCITES TECHNIQUE: Limited ultrasound survey for ascites was performed in all four abdominal quadrants. COMPARISON:  02/24/2019 CT scan FINDINGS: Right pleural effusion is observed.  No appreciable ascites. IMPRESSION: 1. Small right pleural effusion.  No ascites observed. Electronically Signed   By: Van Clines M.D.   On: 02/25/2019 14:17   ASSESSMENT AND PLAN:   Acute kidney injury on chronic kidney disease- likely due to nausea, vomiting, diarrhea.   Creatinine is getting worse 2.20-2.32-2.65--2.64  previously it was at 1.6-2.0 -Renal ultrasound with medical renal disease -Patient is  on Lasix drip -with ejection fraction 10 to 15% -Holding entresto, torsemide, metolazone -Avoid nephrotoxins -Nephrology Dr. Zollie Scale is following  Nausea/vomiting/diarrhea-clinically not improving  abdominal x-ray unremarkable.   -Diarrhea resolved -Zofran and Phenergan as needed Compazine added to the regimen -Tramadol discontinued as narcotics might  slow down GI motility  -CT abdomen and pelvis ordered-likely cirrhosis -Abdominal ultrasound -did not reveal ascites -GI -seen by Dr. Vicente Males,  recommending conservative management in view of his cardiomyopathy with ejection fraction 10 to 15%.  EGD does not change his management per GI -GI pathogen panel negative -Clear liquid diet as tolerated -PPI -Gentle IV fluids provided, discontinued currently -IV antiemetics -If no improvement in 2 days MRI of the brain-to rule out cerebral lesions   Left ankle pain- patient states that this feels like his regular gout.  No erythema, edema, or warmth on exam.  Patient has full range of motion. -Continue colchicine -Add heating pad and Voltaren gel, as patient states this usually helps him a lot. -Normal left ankle x-ray  Elevated troponin- likely demand ischemia in the setting of AKI.  Troponins mildly elevated and flat.  No active chest pain. -Monitor  Cardiomyopathy/hx MI/chronic combined CHF ejection fraction 10-15% status post AICD X-ray with fluid overload - -patient is started on Lasix drip  Repeat echocardiogram ordered-results pending cardiology consult to [facility] cardiology-Dr. Nehemiah Massed is following -Holding entresto, torsemide, metolazone -Continue aspirin, Plavix, Coreg, amiodarone -Will need to watch respiratory status closely while giving fluids  Chronic COPD- currently with no exacerbation -Continue home inhalers -DuoNebs PRN  History of hyperthyroidism- TSH undetectable, T4 mildly elevated, T3 normal. -Increase methimazole to 50 mg daily -Needs to follow-up with endocrinology on discharge  Poor prognosis if no clinical improvement will consider palliative care  All the records are reviewed and case discussed with Care Management/Social Worker. Management plans discussed with the patient, he is in agreement.  CODE STATUS: Full Code  TOTAL TIME TAKING CARE OF THIS PATIENT: 35 minutes.   More than 50% of the time was spent in counseling/coordination of care: YES  POSSIBLE D/C IN 1-2 DAYS, DEPENDING ON CLINICAL CONDITION.   [name] M.D on 02/25/2019 at 4:00  PM  Between 7am to 6pm - Pager - [phone] After 6pm go to [email or web address] - Proofreader  [facility]  Office  [phone]  CC: Primary care physician; Valerie Roys, DO  Note: This dictation was prepared with Dragon dictation along with smaller phrase technology. Any transcriptional errors that result from this process are unintentional.

## 2019-02-26 LAB — CBC
HCT: 37.9 % — ABNORMAL LOW (ref 39.0–52.0)
Hemoglobin: 12.1 g/dL — ABNORMAL LOW (ref 13.0–17.0)
MCH: 28.4 pg (ref 26.0–34.0)
MCHC: 31.9 g/dL (ref 30.0–36.0)
MCV: 89 fL (ref 80.0–100.0)
Platelets: 210 10*3/uL (ref 150–400)
RBC: 4.26 MIL/uL (ref 4.22–5.81)
RDW: 17.1 % — ABNORMAL HIGH (ref 11.5–15.5)
WBC: 7.4 10*3/uL (ref 4.0–10.5)
nRBC: 0 % (ref 0.0–0.2)

## 2019-02-26 LAB — BASIC METABOLIC PANEL
Anion gap: 11 (ref 5–15)
BUN: 39 mg/dL — AB (ref 6–20)
CO2: 23 mmol/L (ref 22–32)
Calcium: 9 mg/dL (ref 8.9–10.3)
Chloride: 106 mmol/L (ref 98–111)
Creatinine, Ser: 2.22 mg/dL — ABNORMAL HIGH (ref 0.61–1.24)
GFR calc Af Amer: 38 mL/min — ABNORMAL LOW (ref >60)
GFR calc non Af Amer: 33 mL/min — ABNORMAL LOW (ref >60)
Glucose, Bld: 110 mg/dL — ABNORMAL HIGH (ref 70–99)
Potassium: 3.2 mmol/L — ABNORMAL LOW (ref 3.5–5.1)
Sodium: 140 mmol/L (ref 135–145)

## 2019-02-26 MED ORDER — POTASSIUM CHLORIDE 10 MEQ/100ML IV SOLN
10.0000 meq | INTRAVENOUS | Status: AC
  Administered 2019-02-26 (×4): 10 meq via INTRAVENOUS
  Filled 2019-02-26 (×4): qty 100

## 2019-02-26 MED ORDER — NEPRO/CARBSTEADY PO LIQD
237.0000 mL | Freq: Two times a day (BID) | ORAL | Status: DC
  Administered 2019-02-26: 237 mL via ORAL

## 2019-02-26 MED ORDER — POTASSIUM CHLORIDE CRYS ER 20 MEQ PO TBCR
40.0000 meq | EXTENDED_RELEASE_TABLET | ORAL | Status: AC
  Administered 2019-02-26 (×2): 40 meq via ORAL
  Filled 2019-02-26 (×2): qty 2

## 2019-02-26 MED ORDER — ADULT MULTIVITAMIN W/MINERALS CH
1.0000 | ORAL_TABLET | Freq: Every day | ORAL | Status: DC
  Administered 2019-02-27 – 2019-03-01 (×3): 1 via ORAL
  Filled 2019-02-26 (×3): qty 1

## 2019-02-26 MED ORDER — LOPERAMIDE HCL 2 MG PO CAPS
2.0000 mg | ORAL_CAPSULE | ORAL | Status: DC | PRN
  Administered 2019-02-26: 2 mg via ORAL
  Filled 2019-02-26: qty 1

## 2019-02-26 NOTE — Progress Notes (Signed)
[facility] [place] NAME: Stephen [name]    MR#:  [id number]  DATE OF BIRTH:  Dec 01, 1966  SUBJECTIVE:   Patient reports vomiting in a.m. but vomit bags are empty and no one witnessed his vomiting.  Patient is looking forward for heart transplantation  abdominal swelling is getting better  REVIEW OF SYSTEMS:  Review of Systems  Constitutional: Negative for chills and fever.  HENT: Negative for congestion and sore throat.   Eyes: Negative for blurred vision and double vision.  Respiratory: Negative for cough and shortness of breath.   Cardiovascular: Negative for chest pain and palpitations.  Gastrointestinal: Positive for nausea and vomiting. Negative for abdominal pain and diarrhea.  Genitourinary: Negative for dysuria and urgency.  Musculoskeletal: Negative for back pain and neck pain.  Neurological: Negative for dizziness and headaches.  Psychiatric/Behavioral: Negative for depression. The patient is not nervous/anxious.     DRUG ALLERGIES:   Allergies  Allergen Reactions  . Lisinopril Cough   VITALS:  Blood pressure 106/83, pulse 69, temperature 98.1 F (36.7 C), temperature source Oral, resp. rate 18, height 6\' 1"  (1.854 m), weight 103.4 kg, SpO2 98 %. PHYSICAL EXAMINATION:  Physical Exam  GENERAL:  53 y.o.-year-old patient lying in the bed with no acute distress.  EYES: Pupils equal, round, reactive to light and accommodation. No scleral icterus. Extraocular muscles intact.  HEENT: Head atraumatic, normocephalic. Oropharynx and nasopharynx clear.    Moist mucous membranes NECK:  Supple, no jugular venous distention. No thyroid enlargement, no tenderness.  LUNGS: Normal breath sounds bilaterally, no wheezing, rales,rhonchi or crepitation. No use of accessory muscles of respiration.  CARDIOVASCULAR: RRR, S1, S2 normal. No murmurs, rubs, or gallops.  ABDOMEN: Soft, + Mild distention, ?fluid wave. bowel sounds present.    EXTREMITIES: No pedal edema, cyanosis, or clubbing. +left ankle is tender to palpation without erythema or warmth. NEUROLOGIC: CN 2-12 grossly normal, no focal deficits. sensation intact. Gait not checked.  PSYCHIATRIC: The patient is alert and oriented x 3.  SKIN: No obvious rash, lesion, or ulcer.  LABORATORY PANEL:  Male CBC Recent Labs  Lab 02/26/19 0554  WBC 7.4  HGB 12.1*  HCT 37.9*  PLT 210   ------------------------------------------------------------------------------------------------------------------ Chemistries  Recent Labs  Lab 02/24/19 0452  02/26/19 0554  NA 139   < > 140  K 3.4*   < > 3.2*  CL 107   < > 106  CO2 23   < > 23  GLUCOSE 97   < > 110*  BUN 42*   < > 39*  CREATININE 2.64*   < > 2.22*  CALCIUM 9.1   < > 9.0  AST 16  --   --   ALT 19  --   --   ALKPHOS 70  --   --   BILITOT 1.0  --   --    < > = values in this interval not displayed.   RADIOLOGY:  No results found. 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 Nehemiah Massed is following -Holding entresto, torsemide, metolazone -Continue aspirin, Plavix, Coreg, amiodarone   Acute kidney injury on chronic kidney disease- likely due to nausea, vomiting, diarrhea.   Creatinine is getting worse 2.20-2.32-2.65--2.64-2.22  previously it was at 1.6-2.0 -Renal ultrasound with  medical renal disease -Patient is  on Lasix drip -with ejection fraction 10 to 15%, echocardiogram during this admission has revealed 10% EF, did not reveal any pericardial effusion -Holding entresto, torsemide, metolazone -Avoid nephrotoxins -Nephrology Dr. Zollie Scale is following  Nausea/vomiting/diarrhea-clinically not improving  abdominal x-ray unremarkable.   -Zofran and Phenergan as  needed Compazine added to the regimen -Tramadol discontinued as narcotics might slow down GI motility  -CT abdomen and pelvis ordered-likely cirrhosis -Abdominal ultrasound -did not reveal ascites -GI -seen by Dr. Vicente Males, recommending conservative management in view of his cardiomyopathy with ejection fraction 10 %.  EGD does not change his management per GI -GI pathogen panel negative -Clear liquid diet advance as tolerated -PPI -Gentle IV fluids provided, discontinued currently -IV antiemetics -If no improvement by tomorrow MRI of the brain-to rule out cerebral lesions   Left ankle pain- patient states that this feels like his regular gout.  No erythema, edema, or warmth on exam.  Patient has full range of motion. -Continue colchicine -Add heating pad and Voltaren gel, as patient states this usually helps him a lot. -Normal left ankle x-ray  Elevated troponin- likely demand ischemia in the setting of AKI.  Troponins mildly elevated and flat.  No active chest pain. -Monitor  - Chronic COPD- currently with no exacerbation -Continue home inhalers -DuoNebs PRN  History of hyperthyroidism- TSH undetectable, T4 mildly elevated, T3 normal. -Increase methimazole to 50 mg daily -Needs to follow-up with endocrinology on discharge  Poor prognosis if no clinical improvement will consider palliative care  All the records are reviewed and case discussed with Care Management/Social Worker. Management plans discussed with the patient, he is in agreement.  CODE STATUS: Full Code  TOTAL TIME TAKING CARE OF THIS PATIENT: 35 minutes.   More than 50% of the time was spent in counseling/coordination of care: YES  POSSIBLE D/C IN 1-2 DAYS, DEPENDING ON CLINICAL CONDITION.   [name] M.D on 02/26/2019 at 3:11 PM  Between 7am to 6pm - Pager - [phone] After 6pm go to [email or web address] - Proofreader  [facility]  Office  [phone]  CC: Primary care  physician; Valerie Roys, DO  Note: This dictation was prepared with Dragon dictation along with smaller phrase technology. Any transcriptional errors that result from this process are unintentional.

## 2019-02-26 NOTE — Progress Notes (Signed)
Initial Nutrition Assessment  DOCUMENTATION CODES:   Not applicable  INTERVENTION:   Nepro Shake po BID, each supplement provides 425 kcal and 19 grams protein  Magic cup TID with meals, each supplement provides 290 kcal and 9 grams of protein  MVI daily   Liberalize diet   Pt at moderate refeed risk; recommend monitor K, Mg and P labs daily until stable.   NUTRITION DIAGNOSIS:   Inadequate oral intake related to acute illness as evidenced by per patient/family report.  GOAL:   Patient will meet greater than or equal to 90% of their needs  MONITOR:   PO intake, Supplement acceptance, Labs, Weight trends, Skin, I & O's  REASON FOR ASSESSMENT:   LOS    ASSESSMENT:   53 y.o. male with a PMHX of chronic systolic heart failure ejection fraction 10-15%, chronic kidney disease stage III, prior history of cardiac arrest, hyperlipidemia, nephrolithiasis, hypertension, history of TIA, was admitted to [facility] on 02/17/2019 with nausea, vomiting, and acute renal failure.    Met with pt in room today. Pt reports decreased appetite and oral intake for 1 week. Pt reports continued poor appetite and oral intake today. Pt reports continued nausea and vomiting x 2 this morning. Pt documented to have eaten 50-100% of meals prior to 2/25. Pt reports his abdomen feels tight today but this is improved. CT scan without any significant findings. No ascites found on abdominal ultrasound. Pt does not like Ensure; reports this is too thick. RD will change supplement to Nepro as this comes in mixed berry flavor. Pt does not like chocolate or vanilla. Pt has been drinking some Boost Breeze. Pt reports his UBW is ~235lbs. Per chart, pt appears weight stable pta and since admit. Suspect pt at moderate refeed risk; recommend monitor K, Mg and P labs daily until stable.   Medications reviewed and include: aspirin, plavix, colace, protonix, miralax, KCl, lasix   Labs reviewed: K 3.2(L), BUN 39(H), creat  2.22(H)  NUTRITION - FOCUSED PHYSICAL EXAM:    Most Recent Value  Orbital Region  No depletion  Upper Arm Region  No depletion  Thoracic and Lumbar Region  No depletion  Buccal Region  No depletion  Temple Region  Mild depletion  Clavicle Bone Region  No depletion  Clavicle and Acromion Bone Region  No depletion  Scapular Bone Region  No depletion  Dorsal Hand  No depletion  Patellar Region  Moderate depletion  Anterior Thigh Region  Moderate depletion  Posterior Calf Region  Moderate depletion  Edema (RD Assessment)  Mild  Hair  Reviewed  Eyes  Reviewed  Mouth  Reviewed  Skin  Reviewed  Nails  Reviewed     Diet Order:   Diet Order            Diet Heart Room service appropriate? Yes; Fluid consistency: Thin  Diet effective now             EDUCATION NEEDS:   Education needs have been addressed  Skin:  Skin Assessment: Reviewed RN Assessment  Last BM:  2/26  Height:   Ht Readings from Last 1 Encounters:  02/17/19 '6\' 1"'$  (1.854 m)    Weight:   Wt Readings from Last 1 Encounters:  02/26/19 103.4 kg    Ideal Body Weight:  83.6 kg  BMI:  Body mass index is 30.07 kg/m.  Estimated Nutritional Needs:   Kcal:  2300-2600kcal/day   Protein:  114-124g/day   Fluid:  2.5L/day   Koleen Distance MS,  RD, LDN Pager #- [phone] Office#- [phone] After Hours Pager: [phone]

## 2019-02-26 NOTE — Progress Notes (Signed)
[facility]  ROUNDING NOTE   Subjective:  Renal function actually improved. Patient states that this risk for status has improved also. Tolerating Lasix drip well.  Objective:  Vital signs in last 24 hours:  Temp:  [98.1 F (36.7 C)-98.6 F (37 C)] 98.1 F (36.7 C) (02/27 0753) Pulse Rate:  [68-77] 69 (02/27 0753) Resp:  [18] 18 (02/27 0753) BP: (106-116)/(83-99) 106/83 (02/27 0753) SpO2:  [94 %-98 %] 98 % (02/27 0753) Weight:  [103.4 kg] 103.4 kg (02/27 0504)  Weight change: -1.134 kg Filed Weights   02/24/19 0411 02/25/19 0506 02/26/19 0504  Weight: 104.3 kg 104.5 kg 103.4 kg    Intake/Output: I/O last 3 completed shifts: In: 424.6 [I.V.:424.6] Out: 2000 [Urine:2000]   Intake/Output this shift:  Total I/O In: -  Out: 225 [Urine:225]  Physical Exam: General: No acute distress  Head: Normocephalic, atraumatic. Moist oral mucosal membranes  Eyes: Anicteric  Neck: Supple, trachea midline  Lungs:  Diminished at bases.  Heart: S1S2 no rubs  Abdomen:  Soft, nontender, bowel sounds present  Extremities: 1+ peripheral edema.  Neurologic: Awake, alert, following commands  Skin: No lesions       Basic Metabolic Panel: Recent Labs  Lab 02/22/19 0354 02/23/19 0622 02/24/19 0452 02/25/19 0449 02/26/19 0554  NA 139 139 139 141 140  K 3.9 4.1 3.4* 3.4* 3.2*  CL 108 107 107 107 106  CO2 23 20* 23 24 23   GLUCOSE 97 102* 97 107* 110*  BUN 37* 43* 42* 42* 39*  CREATININE 2.32* 2.65* 2.64* 2.48* 2.22*  CALCIUM 9.0 9.2 9.1 9.1 9.0    Liver Function Tests: Recent Labs  Lab 02/21/19 0551 02/22/19 0354 02/23/19 0622 02/24/19 0452  AST 15 13* 14* 16  ALT 18 16 16 19   ALKPHOS 74 68 73 70  BILITOT 1.3* 1.1 0.9 1.0  PROT 6.8 6.3* 6.6 6.3*  ALBUMIN 3.5 3.2* 3.3* 3.2*   No results for input(s): LIPASE, AMYLASE in the last 168 hours. No results for input(s): AMMONIA in the last 168 hours.  CBC: Recent Labs  Lab 02/20/19 0634 02/26/19 0554  WBC  8.7 7.4  HGB 12.9* 12.1*  HCT 40.5 37.9*  MCV 88.6 89.0  PLT 186 210    Cardiac Enzymes: No results for input(s): CKTOTAL, CKMB, CKMBINDEX, TROPONINI in the last 168 hours.  BNP: Invalid input(s): POCBNP  CBG: No results for input(s): GLUCAP in the last 168 hours.  Microbiology: Results for orders placed or performed during the hospital encounter of 02/17/19  Gastrointestinal Panel by PCR , Stool     Status: None   Collection Time: 02/18/19  8:27 PM  Result Value Ref Range Status   Campylobacter species NOT DETECTED NOT DETECTED Final   Plesimonas shigelloides NOT DETECTED NOT DETECTED Final   Salmonella species NOT DETECTED NOT DETECTED Final   Yersinia enterocolitica NOT DETECTED NOT DETECTED Final   Vibrio species NOT DETECTED NOT DETECTED Final   Vibrio cholerae NOT DETECTED NOT DETECTED Final   Enteroaggregative E coli (EAEC) NOT DETECTED NOT DETECTED Final   Enteropathogenic E coli (EPEC) NOT DETECTED NOT DETECTED Final   Enterotoxigenic E coli (ETEC) NOT DETECTED NOT DETECTED Final   Shiga like toxin producing E coli (STEC) NOT DETECTED NOT DETECTED Final   Shigella/Enteroinvasive E coli (EIEC) NOT DETECTED NOT DETECTED Final   Cryptosporidium NOT DETECTED NOT DETECTED Final   Cyclospora cayetanensis NOT DETECTED NOT DETECTED Final   Entamoeba histolytica NOT DETECTED NOT DETECTED Final  Giardia lamblia NOT DETECTED NOT DETECTED Final   Adenovirus F40/41 NOT DETECTED NOT DETECTED Final   Astrovirus NOT DETECTED NOT DETECTED Final   Norovirus GI/GII NOT DETECTED NOT DETECTED Final   Rotavirus A NOT DETECTED NOT DETECTED Final   Sapovirus (I, II, IV, and V) NOT DETECTED NOT DETECTED Final    Comment: Performed at [facility], [address] [place], [place] [address]    Coagulation Studies: No results for input(s): LABPROT, INR in the last 72 hours.  Urinalysis: No results for input(s): COLORURINE, LABSPEC, PHURINE, GLUCOSEU, HGBUR, BILIRUBINUR,  KETONESUR, PROTEINUR, UROBILINOGEN, NITRITE, LEUKOCYTESUR in the last 72 hours.  Invalid input(s): APPERANCEUR    Imaging: US Abdomen Limited  Result Date: 02/25/2019 CLINICAL DATA:  Abdominal distention for 4 days EXAM: LIMITED ABDOMEN ULTRASOUND FOR ASCITES TECHNIQUE: Limited ultrasound survey for ascites was performed in all four abdominal quadrants. COMPARISON:  02/24/2019 CT scan FINDINGS: Right pleural effusion is observed.  No appreciable ascites. IMPRESSION: 1. Small right pleural effusion.  No ascites observed. Electronically Signed   By: Van Clines M.D.   On: 02/25/2019 14:17     Medications:   . sodium chloride 10 mL/hr at 02/24/19 1241  . furosemide (LASIX) infusion 6 mg/hr (02/26/19 0956)   . amiodarone  200 mg Oral Daily  . aspirin EC  81 mg Oral Daily  . atorvastatin  80 mg Oral q1800  . carvedilol  3.125 mg Oral BID WC  . clopidogrel  75 mg Oral Daily  . colchicine  0.6 mg Oral Daily  . diclofenac sodium  4 g Topical QID  . docusate sodium  100 mg Oral BID  . enoxaparin (LOVENOX) injection  40 mg Subcutaneous Q24H  . feeding supplement (NEPRO CARB STEADY)  237 mL Oral BID BM  . methimazole  50 mg Oral Daily  . mometasone-formoterol  2 puff Inhalation BID  . [START ON 02/27/2019] multivitamin with minerals  1 tablet Oral Daily  . pantoprazole  40 mg Oral Daily  . polyethylene glycol  17 g Oral Daily  . sodium chloride flush  3 mL Intravenous Once  . tiotropium  1 capsule Inhalation Daily   sodium chloride, acetaminophen **OR** acetaminophen, bisacodyl, ondansetron **OR** ondansetron (ZOFRAN) IV, promethazine  Assessment/ Plan:  53 y.o. male with a PMHX of chronic systolic heart failure ejection fraction 10-15%, chronic kidney disease stage III, prior history of cardiac arrest, hyperlipidemia, nephrolithiasis, hypertension, history of TIA, was admitted to [facility] on 02/17/2019 with nausea, vomiting, and acute renal failure.   1.  Acute renal failure likely  secondary to nausea and vomiting as well as cardiorenal syndrome. 2.  Chronic kidney disease stage II baseline creatinine 1.4. 3.  Chronic systolic heart failure. 4.  Hypokalemia.   Plan: patient appears to be improving clinically on Lasix drip.  Continue Lasix drip one additional day.  Administer potassium chloride 40 mg IV x1 today.  Hopefully patient can be started back on torsemide tomorrow.  Otherwise plan as per hospitalist.   LOS: 9 Kimberley Speece 2/27/20203:41 PM

## 2019-02-27 LAB — MAGNESIUM: Magnesium: 1.7 mg/dL (ref 1.7–2.4)

## 2019-02-27 LAB — BASIC METABOLIC PANEL
Anion gap: 13 (ref 5–15)
BUN: 38 mg/dL — ABNORMAL HIGH (ref 6–20)
CO2: 21 mmol/L — ABNORMAL LOW (ref 22–32)
Calcium: 9.1 mg/dL (ref 8.9–10.3)
Chloride: 109 mmol/L (ref 98–111)
Creatinine, Ser: 2.33 mg/dL — ABNORMAL HIGH (ref 0.61–1.24)
GFR calc Af Amer: 36 mL/min — ABNORMAL LOW (ref >60)
GFR calc non Af Amer: 31 mL/min — ABNORMAL LOW (ref >60)
Glucose, Bld: 88 mg/dL (ref 70–99)
Potassium: 3.6 mmol/L (ref 3.5–5.1)
Sodium: 143 mmol/L (ref 135–145)

## 2019-02-27 LAB — PHOSPHORUS: Phosphorus: 3.4 mg/dL (ref 2.5–4.6)

## 2019-02-27 MED ORDER — MAGNESIUM SULFATE 2 GM/50ML IV SOLN
2.0000 g | Freq: Once | INTRAVENOUS | Status: AC
  Administered 2019-02-27: 2 g via INTRAVENOUS
  Filled 2019-02-27: qty 50

## 2019-02-27 MED ORDER — METOCLOPRAMIDE HCL 5 MG/ML IJ SOLN
5.0000 mg | Freq: Two times a day (BID) | INTRAMUSCULAR | Status: DC
  Administered 2019-02-27 – 2019-03-01 (×5): 5 mg via INTRAVENOUS
  Filled 2019-02-27 (×5): qty 2

## 2019-02-27 MED ORDER — PANTOPRAZOLE SODIUM 40 MG PO TBEC
40.0000 mg | DELAYED_RELEASE_TABLET | Freq: Two times a day (BID) | ORAL | Status: DC
  Administered 2019-02-27 – 2019-03-01 (×4): 40 mg via ORAL
  Filled 2019-02-27 (×4): qty 1

## 2019-02-27 MED ORDER — POTASSIUM CHLORIDE 20 MEQ PO PACK: 20.0000 meq | PACK | Freq: Two times a day (BID) | ORAL | Status: DC

## 2019-02-27 MED ORDER — POTASSIUM CHLORIDE CRYS ER 20 MEQ PO TBCR
20.0000 meq | EXTENDED_RELEASE_TABLET | Freq: Two times a day (BID) | ORAL | Status: DC
  Administered 2019-02-27 – 2019-03-01 (×4): 20 meq via ORAL
  Filled 2019-02-27 (×6): qty 1

## 2019-02-27 NOTE — Progress Notes (Signed)
[facility]  ROUNDING NOTE   Subjective:  Renal function continues to improve while on Lasix drip. Urine output was 1.6 L over the preceding 24 hours. Patient starting to feel some better.  Objective:  Vital signs in last 24 hours:  Temp:  [97.8 F (36.6 C)-98.7 F (37.1 C)] 97.8 F (36.6 C) (02/28 1112) Pulse Rate:  [36-84] 70 (02/28 1112) Resp:  [18-24] 18 (02/28 1112) BP: (110-119)/(77-91) 110/77 (02/28 1112) SpO2:  [93 %-100 %] 93 % (02/28 1112) Weight:  [103.1 kg] 103.1 kg (02/28 0537)  Weight change: -0.227 kg Filed Weights   02/25/19 0506 02/26/19 0504 02/27/19 0537  Weight: 104.5 kg 103.4 kg 103.1 kg    Intake/Output: I/O last 3 completed shifts: In: 320 [P.O.:320] Out: 2625 [Urine:2625]   Intake/Output this shift:  Total I/O In: -  Out: 200 [Urine:200]  Physical Exam: General: No acute distress  Head: Normocephalic, atraumatic. Moist oral mucosal membranes  Eyes: Anicteric  Neck: Supple, trachea midline  Lungs:  Diminished at bases.  Heart: S1S2 no rubs  Abdomen:  Soft, nontender, bowel sounds present  Extremities: 1+ peripheral edema.  Neurologic: Awake, alert, following commands  Skin: No lesions       Basic Metabolic Panel: Recent Labs  Lab 02/23/19 0622 02/24/19 0452 02/25/19 0449 02/26/19 0554 02/27/19 0422  NA 139 139 141 140 143  K 4.1 3.4* 3.4* 3.2* 3.6  CL 107 107 107 106 109  CO2 20* 23 24 23  21*  GLUCOSE 102* 97 107* 110* 88  BUN 43* 42* 42* 39* 38*  CREATININE 2.65* 2.64* 2.48* 2.22* 2.33*  CALCIUM 9.2 9.1 9.1 9.0 9.1  MG  --   --   --   --  1.7  PHOS  --   --   --   --  3.4    Liver Function Tests: Recent Labs  Lab 02/21/19 0551 02/22/19 0354 02/23/19 0622 02/24/19 0452  AST 15 13* 14* 16  ALT 18 16 16 19   ALKPHOS 74 68 73 70  BILITOT 1.3* 1.1 0.9 1.0  PROT 6.8 6.3* 6.6 6.3*  ALBUMIN 3.5 3.2* 3.3* 3.2*   No results for input(s): LIPASE, AMYLASE in the last 168 hours. No results for input(s):  AMMONIA in the last 168 hours.  CBC: Recent Labs  Lab 02/26/19 0554  WBC 7.4  HGB 12.1*  HCT 37.9*  MCV 89.0  PLT 210    Cardiac Enzymes: No results for input(s): CKTOTAL, CKMB, CKMBINDEX, TROPONINI in the last 168 hours.  BNP: Invalid input(s): POCBNP  CBG: No results for input(s): GLUCAP in the last 168 hours.  Microbiology: Results for orders placed or performed during the hospital encounter of 02/17/19  Gastrointestinal Panel by PCR , Stool     Status: None   Collection Time: 02/18/19  8:27 PM  Result Value Ref Range Status   Campylobacter species NOT DETECTED NOT DETECTED Final   Plesimonas shigelloides NOT DETECTED NOT DETECTED Final   Salmonella species NOT DETECTED NOT DETECTED Final   Yersinia enterocolitica NOT DETECTED NOT DETECTED Final   Vibrio species NOT DETECTED NOT DETECTED Final   Vibrio cholerae NOT DETECTED NOT DETECTED Final   Enteroaggregative E coli (EAEC) NOT DETECTED NOT DETECTED Final   Enteropathogenic E coli (EPEC) NOT DETECTED NOT DETECTED Final   Enterotoxigenic E coli (ETEC) NOT DETECTED NOT DETECTED Final   Shiga like toxin producing E coli (STEC) NOT DETECTED NOT DETECTED Final   Shigella/Enteroinvasive E coli (EIEC) NOT DETECTED NOT  DETECTED Final   Cryptosporidium NOT DETECTED NOT DETECTED Final   Cyclospora cayetanensis NOT DETECTED NOT DETECTED Final   Entamoeba histolytica NOT DETECTED NOT DETECTED Final   Giardia lamblia NOT DETECTED NOT DETECTED Final   Adenovirus F40/41 NOT DETECTED NOT DETECTED Final   Astrovirus NOT DETECTED NOT DETECTED Final   Norovirus GI/GII NOT DETECTED NOT DETECTED Final   Rotavirus A NOT DETECTED NOT DETECTED Final   Sapovirus (I, II, IV, and V) NOT DETECTED NOT DETECTED Final    Comment: Performed at [facility], [address] [place], [place] [address]    Coagulation Studies: No results for input(s): LABPROT, INR in the last 72 hours.  Urinalysis: No results for input(s):  COLORURINE, LABSPEC, PHURINE, GLUCOSEU, HGBUR, BILIRUBINUR, KETONESUR, PROTEINUR, UROBILINOGEN, NITRITE, LEUKOCYTESUR in the last 72 hours.  Invalid input(s): APPERANCEUR    Imaging: No results found.   Medications:   . sodium chloride 500 mL (02/27/19 1057)  . furosemide (LASIX) infusion 6 mg/hr (02/26/19 0956)  . magnesium sulfate 1 - 4 g bolus IVPB     . amiodarone  200 mg Oral Daily  . aspirin EC  81 mg Oral Daily  . atorvastatin  80 mg Oral q1800  . carvedilol  3.125 mg Oral BID WC  . clopidogrel  75 mg Oral Daily  . colchicine  0.6 mg Oral Daily  . diclofenac sodium  4 g Topical QID  . docusate sodium  100 mg Oral BID  . enoxaparin (LOVENOX) injection  40 mg Subcutaneous Q24H  . feeding supplement (NEPRO CARB STEADY)  237 mL Oral BID BM  . methimazole  50 mg Oral Daily  . metoCLOPramide (REGLAN) injection  5 mg Intravenous Q12H  . mometasone-formoterol  2 puff Inhalation BID  . multivitamin with minerals  1 tablet Oral Daily  . pantoprazole  40 mg Oral BID  . polyethylene glycol  17 g Oral Daily  . sodium chloride flush  3 mL Intravenous Once  . tiotropium  1 capsule Inhalation Daily   sodium chloride, acetaminophen **OR** acetaminophen, bisacodyl, loperamide, ondansetron **OR** ondansetron (ZOFRAN) IV, promethazine  Assessment/ Plan:  53 y.o. male with a PMHX of chronic systolic heart failure ejection fraction 10-15%, chronic kidney disease stage III, prior history of cardiac arrest, hyperlipidemia, nephrolithiasis, hypertension, history of TIA, was admitted to [facility] on 02/17/2019 with nausea, vomiting, and acute renal failure.   1.  Acute renal failure likely secondary to nausea and vomiting as well as cardiorenal syndrome. 2.  Chronic kidney disease stage II baseline creatinine 1.4. 3.  Chronic systolic heart failure. 4.  Hypokalemia.   Plan: patient's respiratory status appears to have improved. We will maintain the patient on Lasix drip at 6 mg per hour for now.   Consider transitioning to torsemide tomorrow or over the weekendas per the patient's condition.  He continues to very low ejection fraction.  Potassium was repleted yesterday and is currently 3.6.  Further plan as patient progresses.   LOS: 10 Bevin Mayall 2/28/20203:32 PM

## 2019-02-27 NOTE — Progress Notes (Signed)
[facility] NAME: Stephen [name]    MR#:  [id number]  DATE OF BIRTH:  10/01/1966  SUBJECTIVE:   Patient reports vomiting.  Patient is looking forward for heart transplantation  abdominal swelling is getting better  REVIEW OF SYSTEMS:  Review of Systems  Constitutional: Negative for chills and fever.  HENT: Negative for congestion and sore throat.   Eyes: Negative for blurred vision and double vision.  Respiratory: Negative for cough and shortness of breath.   Cardiovascular: Negative for chest pain and palpitations.  Gastrointestinal: Positive for nausea and vomiting. Negative for abdominal pain and diarrhea.  Genitourinary: Negative for dysuria and urgency.  Musculoskeletal: Negative for back pain and neck pain.  Neurological: Negative for dizziness and headaches.  Psychiatric/Behavioral: Negative for depression. The patient is not nervous/anxious.     DRUG ALLERGIES:   Allergies  Allergen Reactions  . Lisinopril Cough   VITALS:  Blood pressure 110/77, pulse 70, temperature 97.8 F (36.6 C), temperature source Oral, resp. rate 18, height 6\' 1"  (1.854 m), weight 103.1 kg, SpO2 93 %. PHYSICAL EXAMINATION:  Physical Exam  GENERAL:  53 y.o.-year-old patient lying in the bed with no acute distress.  EYES: Pupils equal, round, reactive to light and accommodation. No scleral icterus. Extraocular muscles intact.  HEENT: Head atraumatic, normocephalic. Oropharynx and nasopharynx clear.    Moist mucous membranes NECK:  Supple, no jugular venous distention. No thyroid enlargement, no tenderness.  LUNGS: Normal breath sounds bilaterally, no wheezing, rales,rhonchi or crepitation. No use of accessory muscles of respiration.  CARDIOVASCULAR: RRR, S1, S2 normal. No murmurs, rubs, or gallops.  ABDOMEN: Soft, + Mild distention, ?fluid wave. bowel sounds present.  EXTREMITIES: No pedal edema, cyanosis, or clubbing. +left ankle is  tender to palpation without erythema or warmth. NEUROLOGIC: CN 2-12 grossly normal, no focal deficits. sensation intact. Gait not checked.  PSYCHIATRIC: The patient is alert and oriented x 3.  SKIN: No obvious rash, lesion, or ulcer.  LABORATORY PANEL:  Male CBC Recent Labs  Lab 02/26/19 0554  WBC 7.4  HGB 12.1*  HCT 37.9*  PLT 210   ------------------------------------------------------------------------------------------------------------------ Chemistries  Recent Labs  Lab 02/24/19 0452  02/27/19 0422  NA 139   < > 143  K 3.4*   < > 3.6  CL 107   < > 109  CO2 23   < > 21*  GLUCOSE 97   < > 88  BUN 42*   < > 38*  CREATININE 2.64*   < > 2.33*  CALCIUM 9.1   < > 9.1  MG  --   --  1.7  AST 16  --   --   ALT 19  --   --   ALKPHOS 70  --   --   BILITOT 1.0  --   --    < > = values in this interval not displayed.   RADIOLOGY:  No results found. 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 Nehemiah Massed is following.  Patient and family wants to talk to cardiology, information given to cardiology. -Holding entresto, torsemide, metolazone -Continue aspirin, Plavix, Coreg, amiodarone   Acute kidney injury on chronic kidney disease- likely due to nausea, vomiting, diarrhea.   Creatinine is getting  worse 2.20-2.32-2.65--2.64-2.22-2.33  previously it was at 1.6-2.0 -Renal ultrasound with medical renal disease -Patient is  on Lasix drip total negative output -4612 -with ejection fraction 10 to 15%, echocardiogram during this admission has revealed 10% EF, did not reveal any pericardial effusion -Holding entresto, torsemide, metolazone -Avoid nephrotoxins -Nephrology Dr. Zollie Scale is  following  Nausea/vomiting/diarrhea-clinically not improving  abdominal x-ray unremarkable.   -Zofran and Phenergan as needed Compazine added to the regimen -Reglan is added as cardiology is okay -Tramadol discontinued as narcotics might slow down GI motility  -CT abdomen and pelvis ordered-likely cirrhosis -Abdominal ultrasound -did not reveal ascites -Check stool for H. pylori antigen -GI -seen by Dr. Vicente Males, recommending conservative management in view of his cardiomyopathy with ejection fraction 10 %.  EGD does not change his management per GI -GI pathogen panel negative -Clear liquid diet advance as tolerated -PPI-increased to twice daily -Gentle IV fluids provided, discontinued currently -IV antiemetics -If no improvement by tomorrow MRI of the brain-to rule out cerebral lesions   Left ankle pain- patient states that this feels like his regular gout.  No erythema, edema, or warmth on exam.  Patient has full range of motion. -Continue colchicine -Add heating pad and Voltaren gel, as patient states this usually helps him a lot. -Normal left ankle x-ray  Elevated troponin- likely demand ischemia in the setting of AKI.  Troponins mildly elevated and flat.  No active chest pain. -Monitor  - Chronic COPD- currently with no exacerbation -Continue home inhalers -DuoNebs PRN  History of hyperthyroidism- TSH undetectable, T4 mildly elevated, T3 normal. -Increase methimazole to 50 mg daily -Needs to follow-up with endocrinology on discharge  Poor prognosis if no clinical improvement will consider palliative care  All the records are reviewed and case discussed with Care Management/Social Worker. Management plans discussed with the patient, Sister Charlene Brooke over phone both are in agreement.  CODE STATUS: Full Code  TOTAL TIME TAKING CARE OF THIS PATIENT: 35 minutes.   More than 50% of the time was spent in counseling/coordination of care: YES  POSSIBLE D/C IN 1-2 DAYS,  DEPENDING ON CLINICAL CONDITION.   Stephen [name] M.D on 02/27/2019 at 3:20 PM  Between 7am to 6pm - Pager - [phone] After 6pm go to [email or web address] - Proofreader  [facility]  Office  [phone]  CC: Primary care physician; Valerie Roys, DO  Note: This dictation was prepared with Dragon dictation along with smaller phrase technology. Any transcriptional errors that result from this process are unintentional.

## 2019-02-27 NOTE — Progress Notes (Signed)
Stephen [name] , MD [address], [place], [place], [place], [address] [address] [place], [place], [place], [address] Phone: [phone]  Fax: [phone]   Stephen [name] is being followed for nasuea    Subjective: Nausea and vomiting persists    Objective: Vital signs in last 24 hours: Vitals:   02/26/19 2039 02/27/19 0537 02/27/19 1107 02/27/19 1112  BP: 114/90 111/77  110/77  Pulse: 84 (!) 36 75 70  Resp: 18 (!) 24  18  Temp: 98.6 F (37 C) 98.7 F (37.1 C)  97.8 F (36.6 C)  TempSrc: Oral Oral  Oral  SpO2: 100% 95%  93%  Weight:  103.1 kg    Height:       Weight change: -0.227 kg  Intake/Output Summary (Last 24 hours) at 02/27/2019 1311 Last data filed at 02/27/2019 0300 Gross per 24 hour  Intake 320 ml  Output 1050 ml  Net -730 ml     Exam: Heart:: Regular rate and rhythm, S1S2 present or without murmur or extra heart sounds Lungs: normal, clear to auscultation and clear to auscultation and percussion Abdomen: soft, nontender, normal bowel sounds   Lab Results: @LABTEST2 @ Micro Results: Recent Results (from the past 240 hour(s))  Gastrointestinal Panel by PCR , Stool     Status: None   Collection Time: 02/18/19  8:27 PM  Result Value Ref Range Status   Campylobacter species NOT DETECTED NOT DETECTED Final   Plesimonas shigelloides NOT DETECTED NOT DETECTED Final   Salmonella species NOT DETECTED NOT DETECTED Final   Yersinia enterocolitica NOT DETECTED NOT DETECTED Final   Vibrio species NOT DETECTED NOT DETECTED Final   Vibrio cholerae NOT DETECTED NOT DETECTED Final   Enteroaggregative E coli (EAEC) NOT DETECTED NOT DETECTED Final   Enteropathogenic E coli (EPEC) NOT DETECTED NOT DETECTED Final   Enterotoxigenic E coli (ETEC) NOT DETECTED NOT DETECTED Final   Shiga like toxin producing E coli (STEC) NOT DETECTED NOT DETECTED Final   Shigella/Enteroinvasive E coli (EIEC) NOT DETECTED NOT DETECTED Final   Cryptosporidium NOT DETECTED NOT  DETECTED Final   Cyclospora cayetanensis NOT DETECTED NOT DETECTED Final   Entamoeba histolytica NOT DETECTED NOT DETECTED Final   Giardia lamblia NOT DETECTED NOT DETECTED Final   Adenovirus F40/41 NOT DETECTED NOT DETECTED Final   Astrovirus NOT DETECTED NOT DETECTED Final   Norovirus GI/GII NOT DETECTED NOT DETECTED Final   Rotavirus A NOT DETECTED NOT DETECTED Final   Sapovirus (I, II, IV, and V) NOT DETECTED NOT DETECTED Final    Comment: Performed at [facility], [address] [place], [place] [address]   Studies/Results: No results found. Medications: I have reviewed the patient's current medications. Scheduled Meds: . amiodarone  200 mg Oral Daily  . aspirin EC  81 mg Oral Daily  . atorvastatin  80 mg Oral q1800  . carvedilol  3.125 mg Oral BID WC  . clopidogrel  75 mg Oral Daily  . colchicine  0.6 mg Oral Daily  . diclofenac sodium  4 g Topical QID  . docusate sodium  100 mg Oral BID  . enoxaparin (LOVENOX) injection  40 mg Subcutaneous Q24H  . feeding supplement (NEPRO CARB STEADY)  237 mL Oral BID BM  . methimazole  50 mg Oral Daily  . mometasone-formoterol  2 puff Inhalation BID  . multivitamin with minerals  1 tablet Oral Daily  . pantoprazole  40 mg Oral BID  . polyethylene glycol  17 g Oral  Daily  . sodium chloride flush  3 mL Intravenous Once  . tiotropium  1 capsule Inhalation Daily   Continuous Infusions: . sodium chloride 500 mL (02/27/19 1057)  . furosemide (LASIX) infusion 6 mg/hr (02/26/19 0956)   PRN Meds:.sodium chloride, acetaminophen **OR** acetaminophen, bisacodyl, loperamide, ondansetron **OR** ondansetron (ZOFRAN) IV, promethazine   Assessment: Active Problems:   AKI (acute kidney injury) (Minor)   Stephen [name] 53 y.o. male consulted for nausea. No luminal obstruction per CT scan of the abdomen . Heart failure too can contribute to nausea , he is on a lasix drip and says he feels a bit better but nausea persists   Plan: 1.  Continue antiemetics, add compazine . EGD with anesthesia is high risk and will not change management 2. PPI 3. Consider Trial of Reglan after review with cardiology  4. If all above does not work consider MRI brain.       LOS: 10 days   Stephen Bellows, MD 02/27/2019, 1:11 PM

## 2019-02-27 NOTE — Progress Notes (Signed)
Ch visited pt to see get an update on his care plan. Pt had just received an update from hospitalist and seemed to hv a flat affect. Pt shared that he cannot hv his endoscopy until he gets his heart stabilized. Pt also shared that he has just begun the process for a heart transplantation. Both the hospitalist and ch shared that hit is a process that included being on a wait list or if he is eligible to be placed on the wait list. Pt shared that he was aware. Pt mentioned that he had to put so much on hold now that he is in the hospital. 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 Ch allowed time for the pt to share and reflect on how he hopes to get back to the things he loves to do and how much he has invested. Ch allowed pt time to share his frustration, yet pt shared how he relies on his faith to get him through. Ch affirmed pt and shared the importance of having a change of perspective about his health.        02/27/19 1300  Clinical Encounter Type  Visited With Health care provider;Patient  Visit Type Spiritual support;Psychological support;Social support  Spiritual Encounters  Spiritual Needs Emotional;Grief support  Stress Factors  Patient Stress Factors Loss;Loss of control;Major life changes;Health changes  Family Stress Factors None identified

## 2019-02-28 LAB — BASIC METABOLIC PANEL
Anion gap: 10 (ref 5–15)
BUN: 38 mg/dL — ABNORMAL HIGH (ref 6–20)
CALCIUM: 8.8 mg/dL — AB (ref 8.9–10.3)
CO2: 24 mmol/L (ref 22–32)
Chloride: 107 mmol/L (ref 98–111)
Creatinine, Ser: 2.18 mg/dL — ABNORMAL HIGH (ref 0.61–1.24)
GFR calc Af Amer: 39 mL/min — ABNORMAL LOW (ref >60)
GFR calc non Af Amer: 33 mL/min — ABNORMAL LOW (ref >60)
Glucose, Bld: 98 mg/dL (ref 70–99)
Potassium: 3.2 mmol/L — ABNORMAL LOW (ref 3.5–5.1)
Sodium: 141 mmol/L (ref 135–145)

## 2019-02-28 LAB — MAGNESIUM: Magnesium: 2 mg/dL (ref 1.7–2.4)

## 2019-02-28 NOTE — Progress Notes (Signed)
[facility] NAME: Stephen [name]    MR#:  [id number]  DATE OF BIRTH:  11/29/1966  SUBJECTIVE:  denies any complaints good urine output with IV Lasix. REVIEW OF SYSTEMS:  Review of Systems  Constitutional: Negative for chills and fever.  HENT: Negative for congestion and sore throat.   Eyes: Negative for blurred vision and double vision.  Respiratory: Negative for cough and shortness of breath.   Cardiovascular: Negative for chest pain and palpitations.  Gastrointestinal: Positive for nausea and vomiting. Negative for abdominal pain and diarrhea.  Genitourinary: Negative for dysuria and urgency.  Musculoskeletal: Negative for back pain and neck pain.  Neurological: Negative for dizziness and headaches.  Psychiatric/Behavioral: Negative for depression. The patient is not nervous/anxious.     DRUG ALLERGIES:   Allergies  Allergen Reactions  . Lisinopril Cough   VITALS:  Blood pressure 118/89, pulse 72, temperature 99 F (37.2 C), temperature source Oral, resp. rate 18, height 6\' 1"  (1.854 m), weight 101.1 kg, SpO2 93 %. PHYSICAL EXAMINATION:  Physical Exam  GENERAL:  53 y.o.-year-old patient lying in the bed with no acute distress.  EYES: Pupils equal, round, reactive to light and accommodation. No scleral icterus. Extraocular muscles intact.  HEENT: Head atraumatic, normocephalic. Oropharynx and nasopharynx clear.    Moist mucous membranes NECK:  Supple, no jugular venous distention. No thyroid enlargement, no tenderness.  LUNGS: Normal breath sounds bilaterally, no wheezing, rales,rhonchi or crepitation. No use of accessory muscles of respiration.  CARDIOVASCULAR: RRR, S1, S2 normal. No murmurs, rubs, or gallops.  ABDOMEN: Soft, + Mild distention bowel sounds present.  EXTREMITIES: No pedal edema, cyanosis, or clubbing. +left ankle is tender to palpation without erythema or warmth. NEUROLOGIC: CN 2-12 grossly normal, no  focal deficits. sensation intact. Gait not checked.  PSYCHIATRIC: The patient is alert and oriented x 3.  SKIN: No obvious rash, lesion, or ulcer.  LABORATORY PANEL:  Male CBC Recent Labs  Lab 02/26/19 0554  WBC 7.4  HGB 12.1*  HCT 37.9*  PLT 210   ------------------------------------------------------------------------------------------------------------------ Chemistries  Recent Labs  Lab 02/24/19 0452  02/28/19 0537  NA 139   < > 141  K 3.4*   < > 3.2*  CL 107   < > 107  CO2 23   < > 24  GLUCOSE 97   < > 98  BUN 42*   < > 38*  CREATININE 2.64*   < > 2.18*  CALCIUM 9.1   < > 8.8*  MG  --    < > 2.0  AST 16  --   --   ALT 19  --   --   ALKPHOS 70  --   --   BILITOT 1.0  --   --    < > = values in this interval not displayed.   RADIOLOGY:  No results found. 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 -cardiology consult to [facility] cardiology-Dr. Nehemiah Massed is following.   -Holding entresto, torsemide, metolazone -Continue aspirin, Plavix, Coreg, amiodarone  *Acute kidney injury on chronic kidney disease- likely due to nausea, vomiting, diarrhea.   -Creatinine is getting worse 2.20-2.32-2.65--2.64-2.22-2.33--2.2 - previously it was at 1.6-2.0 -Renal ultrasound with medical renal disease -with  ejection fraction 10 to 15%, echocardiogram during this admission has revealed 10% EF, did not reveal any pericardial effusion -Holding entresto, torsemide, metolazone -Avoid nephrotoxins -Nephrology Dr kolluruis following  *Nausea/vomiting/diarrhea-clinically improving  abdominal x-ray unremarkable.   -Zofran and Phenergan as needed Compazine added to the regimen -Reglan is added as cardiology is okay -Tramadol discontinued as  narcotics might slow down GI motility  -CT abdomen and pelvis ordered-likely cirrhosis -Abdominal ultrasound -did not reveal ascites -Stool PCR negative -GI -seen by Dr. Vicente Males, recommending conservative management in view of his cardiomyopathy with ejection fraction 10 %.  EGD does not change his management per GI -PPI-increased to twice daily  *Left ankle pain- patient states that this feels like his regular gout.  No erythema, edema, or warmth on exam.  Patient has full range of motion. -Continue colchicine -Add heating pad and Voltaren gel, as patient states this usually helps him a lot. -Normal left ankle x-ray  *Elevated troponin- likely demand ischemia in the setting of AKI.  Troponins mildly elevated and flat.  No active chest pain.  *Chronic COPD- currently with no exacerbation -Continue home inhalers -DuoNebs PRN  *History of hyperthyroidism- TSH undetectable, T4 mildly elevated, T3 normal. -Increase methimazole to 50 mg daily -Needs to follow-up with endocrinology on discharge  Long term Poor prognosis   CODE STATUS: Full Code  TOTAL TIME TAKING CARE OF THIS PATIENT: 35 minutes.   More than 50% of the time was spent in counseling/coordination of care: YES  POSSIBLE D/C IN 1-2 DAYS, DEPENDING ON CLINICAL CONDITION.   Fritzi Mandes M.D on 02/28/2019 at 2:22 PM  Between 7am to 6pm - Pager - [phone] After 6pm go to [email or web address] - Proofreader  [facility]  Office  [phone]  CC: Primary care physician; Valerie Roys, DO  Note: This dictation was prepared with Dragon dictation along with smaller phrase technology. Any transcriptional errors that result from this process are unintentional.

## 2019-02-28 NOTE — Progress Notes (Signed)
Stephen Darby, MD [address]  [place]  [place], [place] [address]  Main: [phone]  Fax: [phone] Pager: [phone]   Subjective: Reports nausea and vomiting better, diuresing well.  Tolerating diet well.   Objective: Vital signs in last 24 hours: Vitals:   02/28/19 0555 02/28/19 0757 02/28/19 1654 02/28/19 2057  BP: 111/84 118/89 98/87 103/89  Pulse: 74 72 70 75  Resp: 18   18  Temp: 98.4 F (36.9 C) 99 F (37.2 C) 98.3 F (36.8 C) 98.2 F (36.8 C)  TempSrc: Oral Oral Oral Oral  SpO2: 96% 93% 99% 100%  Weight: 101.1 kg     Height:       Weight change: -2.087 kg  Intake/Output Summary (Last 24 hours) at 02/28/2019 2127 Last data filed at 02/28/2019 1848 Gross per 24 hour  Intake 660 ml  Output 900 ml  Net -240 ml     Exam: Heart:: Regular rate and rhythm or S1S2 present Lungs: normal and clear to auscultation Abdomen: soft, nontender, normal bowel sounds   Lab Results: CBC Latest Ref Rng & Units 02/26/2019 02/20/2019 02/19/2019  WBC 4.0 - 10.5 K/uL 7.4 8.7 8.1  Hemoglobin 13.0 - 17.0 g/dL 12.1(L) 12.9(L) 12.4(L)  Hematocrit 39.0 - 52.0 % 37.9(L) 40.5 39.0  Platelets 150 - 400 K/uL 210 186 183   CMP Latest Ref Rng & Units 02/28/2019 02/27/2019 02/26/2019  Glucose 70 - 99 mg/dL 98 88 110(H)  BUN 6 - 20 mg/dL 38(H) 38(H) 39(H)  Creatinine 0.61 - 1.24 mg/dL 2.18(H) 2.33(H) 2.22(H)  Sodium 135 - 145 mmol/L 141 143 140  Potassium 3.5 - 5.1 mmol/L 3.2(L) 3.6 3.2(L)  Chloride 98 - 111 mmol/L 107 109 106  CO2 22 - 32 mmol/L 24 21(L) 23  Calcium 8.9 - 10.3 mg/dL 8.8(L) 9.1 9.0  Total Protein 6.5 - 8.1 g/dL - - -  Total Bilirubin 0.3 - 1.2 mg/dL - - -  Alkaline Phos 38 - 126 U/L - - -  AST 15 - 41 U/L - - -  ALT 0 - 44 U/L - - -   Micro Results: No results found for this or any previous visit (from the past 240 hour(s)). Studies/Results: No results found. Medications:  I have reviewed the patient's current medications. Prior to  Admission:  Medications Prior to Admission  Medication Sig Dispense Refill Last Dose  . acetaminophen (TYLENOL) 325 MG tablet Take 2 tablets (650 mg total) by mouth every 6 (six) hours as needed. (Patient taking differently: Take 325-650 mg by mouth every 6 (six) hours as needed for moderate pain. ) 60 tablet 0 Unknown at PRN  . albuterol (PROVENTIL HFA;VENTOLIN HFA) 108 (90 Base) MCG/ACT inhaler Inhale 2 puffs into the lungs every 4 (four) hours as needed for wheezing or shortness of breath. 1 Inhaler 0 Unknown at PRN  . allopurinol (ZYLOPRIM) 100 MG tablet Take 2 tablets (200 mg total) by mouth daily. (Patient taking differently: Take 200 mg by mouth daily as needed. ) 60 tablet 6 Unknown at PRN  . amiodarone (PACERONE) 200 MG tablet Take 1 tablet (200 mg total) by mouth daily.   02/17/2019 at 1000  . aspirin EC 81 MG EC tablet Take 1 tablet (81 mg total) by mouth daily. 90 tablet 3 02/17/2019 at 1000  . atorvastatin (LIPITOR) 40 MG tablet Take 80 mg by mouth daily at 6 PM.   6 02/16/2019 at 1800  . CAMPHOR-EUCALYPTUS-MENTHOL EX Apply 1 application topically as needed (congestion).  Unknown at PRN  . carvedilol (COREG) 3.125 MG tablet Take 1 tablet (3.125 mg total) by mouth 2 (two) times daily with a meal. (Patient taking differently: Take 6.25 mg by mouth daily. ) 60 tablet 0 02/17/2019 at 1000  . clopidogrel (PLAVIX) 75 MG tablet TAKE ONE TABLET BY MOUTH EVERY DAY (Patient taking differently: Take 75 mg by mouth daily. ) 30 tablet 5 02/17/2019 at 1000  . colchicine 0.6 MG tablet Take 1 tablet (0.6 mg total) by mouth daily as needed (gout flare). 6 tablet 1 Unknown at PRN  . cyclobenzaprine (FLEXERIL) 5 MG tablet Take 1 tablet (5 mg total) by mouth 3 (three) times daily as needed (jaw pain). 20 tablet 0 Unknown at PRN  . diclofenac sodium (VOLTAREN) 1 % GEL Apply 2 g topically 4 (four) times daily as needed (pain).    Unknown at PRN  . famotidine (PEPCID) 20 MG tablet Take 1 tablet (20 mg total) by  mouth 2 (two) times daily for 15 days. 30 tablet 0 02/17/2019 at 1000  . fluticasone (FLONASE) 50 MCG/ACT nasal spray Place 2 sprays into both nostrils daily. (Patient taking differently: Place 2 sprays into both nostrils daily as needed for allergies. ) 16 g 2 Unknown at PRN  . fluticasone-salmeterol (ADVAIR HFA) 115-21 MCG/ACT inhaler Inhale 2 puffs into the lungs 2 (two) times daily. 1 Inhaler 12 02/17/2019 at 1000  . meloxicam (MOBIC) 15 MG tablet Take 15 mg by mouth daily as needed for pain.    Unknown at PRN  . Menthol, Topical Analgesic, (ICY HOT EX) Apply 1 application topically daily as needed (pain).   Unknown at PRN  . methimazole (TAPAZOLE) 10 MG tablet Take 4 tablets (40 mg total) by mouth daily. 120 tablet 0 02/17/2019 at 1000  . metolazone (ZAROXOLYN) 2.5 MG tablet Take 2.5 mg by mouth as needed.   11 Unknown at PRN  . Multiple Vitamin (MULTIVITAMIN WITH MINERALS) TABS tablet Take 1 tablet by mouth daily.   02/17/2019 at 1000  . nitroGLYCERIN (NITROSTAT) 0.4 MG SL tablet Place 1 tablet (0.4 mg total) under the tongue every 5 (five) minutes as needed for chest pain. 25 tablet 3 Unknown at PRN  . ondansetron (ZOFRAN ODT) 4 MG disintegrating tablet Take 1 tablet (4 mg total) by mouth every 8 (eight) hours as needed for nausea or vomiting. 20 tablet 0 Unknown at PRN  . potassium chloride SA (K-DUR,KLOR-CON) 20 MEQ tablet Take 1 tablet (20 mEq total) by mouth 4 (four) times daily. 120 tablet 0 02/17/2019 at 1000  . promethazine (PHENERGAN) 12.5 MG tablet Take 1 tablet (12.5 mg total) by mouth every 6 (six) hours as needed for nausea or vomiting. 20 tablet 0 Unknown at PRN  . sacubitril-valsartan (ENTRESTO) 49-51 MG Take 1 tablet by mouth 2 (two) times daily.   02/17/2019 at 1000  . Tiotropium Bromide Monohydrate (SPIRIVA RESPIMAT) 2.5 MCG/ACT AERS Inhale 1 puff into the lungs daily. 1 Inhaler 12 02/17/2019 at 1000  . torsemide (DEMADEX) 20 MG tablet Take 2 tablets (40 mg total) by mouth daily.  (Patient taking differently: Take 40 mg by mouth 2 (two) times daily. ) 60 tablet 0 02/17/2019 at 1000   Scheduled: . amiodarone  200 mg Oral Daily  . aspirin EC  81 mg Oral Daily  . atorvastatin  80 mg Oral q1800  . carvedilol  3.125 mg Oral BID WC  . clopidogrel  75 mg Oral Daily  . colchicine  0.6 mg Oral Daily  .  diclofenac sodium  4 g Topical QID  . docusate sodium  100 mg Oral BID  . enoxaparin (LOVENOX) injection  40 mg Subcutaneous Q24H  . feeding supplement (NEPRO CARB STEADY)  237 mL Oral BID BM  . methimazole  50 mg Oral Daily  . metoCLOPramide (REGLAN) injection  5 mg Intravenous Q12H  . mometasone-formoterol  2 puff Inhalation BID  . multivitamin with minerals  1 tablet Oral Daily  . pantoprazole  40 mg Oral BID  . polyethylene glycol  17 g Oral Daily  . potassium chloride  20 mEq Oral BID  . sodium chloride flush  3 mL Intravenous Once  . tiotropium  1 capsule Inhalation Daily   Continuous: . sodium chloride 500 mL (02/27/19 1057)  . furosemide (LASIX) infusion 6 mg/hr (02/28/19 0631)   AVW:[id number] chloride, acetaminophen **OR** acetaminophen, bisacodyl, loperamide, ondansetron **OR** ondansetron (ZOFRAN) IV, promethazine Anti-infectives (From admission, onward)   None     Scheduled Meds: . amiodarone  200 mg Oral Daily  . aspirin EC  81 mg Oral Daily  . atorvastatin  80 mg Oral q1800  . carvedilol  3.125 mg Oral BID WC  . clopidogrel  75 mg Oral Daily  . colchicine  0.6 mg Oral Daily  . diclofenac sodium  4 g Topical QID  . docusate sodium  100 mg Oral BID  . enoxaparin (LOVENOX) injection  40 mg Subcutaneous Q24H  . feeding supplement (NEPRO CARB STEADY)  237 mL Oral BID BM  . methimazole  50 mg Oral Daily  . metoCLOPramide (REGLAN) injection  5 mg Intravenous Q12H  . mometasone-formoterol  2 puff Inhalation BID  . multivitamin with minerals  1 tablet Oral Daily  . pantoprazole  40 mg Oral BID  . polyethylene glycol  17 g Oral Daily  . potassium chloride   20 mEq Oral BID  . sodium chloride flush  3 mL Intravenous Once  . tiotropium  1 capsule Inhalation Daily   Continuous Infusions: . sodium chloride 500 mL (02/27/19 1057)  . furosemide (LASIX) infusion 6 mg/hr (02/28/19 0631)   PRN Meds:.sodium chloride, acetaminophen **OR** acetaminophen, bisacodyl, loperamide, ondansetron **OR** ondansetron (ZOFRAN) IV, promethazine   Assessment: Active Problems:   AKI (acute kidney injury) (HCC)  Nausea and vomiting, resolved Tolerating diet well  Plan: Continue antiemetics, ok to use metoclopramide as inpatient Do not recommend to discharge patient on metoclopramide Continue PPI Follow-up with GI as outpatient if needed No further recommendations from GI standpoint at this time  GI will sign off at this time   LOS: 11 days   Carmelita Amparo 02/28/2019, 9:27 PM

## 2019-02-28 NOTE — Progress Notes (Signed)
[facility]  ROUNDING NOTE   Subjective:   Refusing some medications this morning.   UOP 1325  Creatinine 2.18 (2.33)  Furosemide 6mg /hr gtt  Objective:  Vital signs in last 24 hours:  Temp:  [98.2 F (36.8 C)-99 F (37.2 C)] 99 F (37.2 C) (02/29 0757) Pulse Rate:  [71-78] 72 (02/29 0757) Resp:  [16-18] 18 (02/29 0555) BP: (99-118)/(84-90) 118/89 (02/29 0757) SpO2:  [93 %-100 %] 93 % (02/29 0757) Weight:  [101.1 kg] 101.1 kg (02/29 0555)  Weight change: -2.087 kg Filed Weights   02/26/19 0504 02/27/19 0537 02/28/19 0555  Weight: 103.4 kg 103.1 kg 101.1 kg    Intake/Output: I/O last 3 completed shifts: In: 700 [P.O.:700] Out: 2375 [Urine:2375]   Intake/Output this shift:  No intake/output data recorded.  Physical Exam: General: No acute distress  Head: Normocephalic, atraumatic. Moist oral mucosal membranes  Eyes: Anicteric  Neck: Supple, trachea midline  Lungs:  Crackles basilar bilaterally  Heart: regular  Abdomen:  Soft, nontender, bowel sounds present  Extremities: trace peripheral edema.  Neurologic: Awake, alert, following commands  Skin: No lesions       Basic Metabolic Panel: Recent Labs  Lab 02/24/19 0452 02/25/19 0449 02/26/19 0554 02/27/19 0422 02/28/19 0537  NA 139 141 140 143 141  K 3.4* 3.4* 3.2* 3.6 3.2*  CL 107 107 106 109 107  CO2 23 24 23  21* 24  GLUCOSE 97 107* 110* 88 98  BUN 42* 42* 39* 38* 38*  CREATININE 2.64* 2.48* 2.22* 2.33* 2.18*  CALCIUM 9.1 9.1 9.0 9.1 8.8*  MG  --   --   --  1.7 2.0  PHOS  --   --   --  3.4  --     Liver Function Tests: Recent Labs  Lab 02/22/19 0354 02/23/19 0622 02/24/19 0452  AST 13* 14* 16  ALT 16 16 19   ALKPHOS 68 73 70  BILITOT 1.1 0.9 1.0  PROT 6.3* 6.6 6.3*  ALBUMIN 3.2* 3.3* 3.2*   No results for input(s): LIPASE, AMYLASE in the last 168 hours. No results for input(s): AMMONIA in the last 168 hours.  CBC: Recent Labs  Lab 02/26/19 0554  WBC 7.4  HGB 12.1*   HCT 37.9*  MCV 89.0  PLT 210    Cardiac Enzymes: No results for input(s): CKTOTAL, CKMB, CKMBINDEX, TROPONINI in the last 168 hours.  BNP: Invalid input(s): POCBNP  CBG: No results for input(s): GLUCAP in the last 168 hours.  Microbiology: Results for orders placed or performed during the hospital encounter of 02/17/19  Gastrointestinal Panel by PCR , Stool     Status: None   Collection Time: 02/18/19  8:27 PM  Result Value Ref Range Status   Campylobacter species NOT DETECTED NOT DETECTED Final   Plesimonas shigelloides NOT DETECTED NOT DETECTED Final   Salmonella species NOT DETECTED NOT DETECTED Final   Yersinia enterocolitica NOT DETECTED NOT DETECTED Final   Vibrio species NOT DETECTED NOT DETECTED Final   Vibrio cholerae NOT DETECTED NOT DETECTED Final   Enteroaggregative E coli (EAEC) NOT DETECTED NOT DETECTED Final   Enteropathogenic E coli (EPEC) NOT DETECTED NOT DETECTED Final   Enterotoxigenic E coli (ETEC) NOT DETECTED NOT DETECTED Final   Shiga like toxin producing E coli (STEC) NOT DETECTED NOT DETECTED Final   Shigella/Enteroinvasive E coli (EIEC) NOT DETECTED NOT DETECTED Final   Cryptosporidium NOT DETECTED NOT DETECTED Final   Cyclospora cayetanensis NOT DETECTED NOT DETECTED Final   Entamoeba histolytica  NOT DETECTED NOT DETECTED Final   Giardia lamblia NOT DETECTED NOT DETECTED Final   Adenovirus F40/41 NOT DETECTED NOT DETECTED Final   Astrovirus NOT DETECTED NOT DETECTED Final   Norovirus GI/GII NOT DETECTED NOT DETECTED Final   Rotavirus A NOT DETECTED NOT DETECTED Final   Sapovirus (I, II, IV, and V) NOT DETECTED NOT DETECTED Final    Comment: Performed at [facility], [address] [place], [place] [address]    Coagulation Studies: No results for input(s): LABPROT, INR in the last 72 hours.  Urinalysis: No results for input(s): COLORURINE, LABSPEC, PHURINE, GLUCOSEU, HGBUR, BILIRUBINUR, KETONESUR, PROTEINUR, UROBILINOGEN, NITRITE,  LEUKOCYTESUR in the last 72 hours.  Invalid input(s): APPERANCEUR    Imaging: No results found.   Medications:   . sodium chloride 500 mL (02/27/19 1057)  . furosemide (LASIX) infusion 6 mg/hr (02/28/19 0631)   . amiodarone  200 mg Oral Daily  . aspirin EC  81 mg Oral Daily  . atorvastatin  80 mg Oral q1800  . carvedilol  3.125 mg Oral BID WC  . clopidogrel  75 mg Oral Daily  . colchicine  0.6 mg Oral Daily  . diclofenac sodium  4 g Topical QID  . docusate sodium  100 mg Oral BID  . enoxaparin (LOVENOX) injection  40 mg Subcutaneous Q24H  . feeding supplement (NEPRO CARB STEADY)  237 mL Oral BID BM  . methimazole  50 mg Oral Daily  . metoCLOPramide (REGLAN) injection  5 mg Intravenous Q12H  . mometasone-formoterol  2 puff Inhalation BID  . multivitamin with minerals  1 tablet Oral Daily  . pantoprazole  40 mg Oral BID  . polyethylene glycol  17 g Oral Daily  . potassium chloride  20 mEq Oral BID  . sodium chloride flush  3 mL Intravenous Once  . tiotropium  1 capsule Inhalation Daily   sodium chloride, acetaminophen **OR** acetaminophen, bisacodyl, loperamide, ondansetron **OR** ondansetron (ZOFRAN) IV, promethazine  Assessment/ Plan:    [name] is a 53 y.o. black male chronic systolic heart failure ejection fraction 10-15%, prior history of cardiac arrest, hyperlipidemia, nephrolithiasis, hypertension, history of TIA, was admitted to [facility] on 02/17/2019    1.  Acute renal failure: secondary to acute cardiorenal syndrome 2.  Chronic kidney disease stage II baseline creatinine 1.85, GFR of 47 on 01/24/19. Bland urine.  3.  Acute on Chronic systolic heart failure. 4.  Hypokalemia.  5. Hyperthyroidism  Plan - Continue furosemide gtt - Encouraged patient to take methimazole.  - Holding entresto and metolazone.    LOS: [place] 2/29/202011:57 AM

## 2019-03-01 LAB — RENAL FUNCTION PANEL
Albumin: 3.3 g/dL — ABNORMAL LOW (ref 3.5–5.0)
Anion gap: 10 (ref 5–15)
BUN: 35 mg/dL — ABNORMAL HIGH (ref 6–20)
CHLORIDE: 105 mmol/L (ref 98–111)
CO2: 23 mmol/L (ref 22–32)
Calcium: 8.7 mg/dL — ABNORMAL LOW (ref 8.9–10.3)
Creatinine, Ser: 1.91 mg/dL — ABNORMAL HIGH (ref 0.61–1.24)
GFR calc Af Amer: 45 mL/min — ABNORMAL LOW (ref >60)
GFR calc non Af Amer: 39 mL/min — ABNORMAL LOW (ref >60)
Glucose, Bld: 156 mg/dL — ABNORMAL HIGH (ref 70–99)
Phosphorus: 2.6 mg/dL (ref 2.5–4.6)
Potassium: 3.1 mmol/L — ABNORMAL LOW (ref 3.5–5.1)
Sodium: 138 mmol/L (ref 135–145)

## 2019-03-01 LAB — H. PYLORI ANTIGEN, STOOL: H. Pylori Stool Ag, Eia: NEGATIVE

## 2019-03-01 MED ORDER — POTASSIUM CHLORIDE CRYS ER 20 MEQ PO TBCR: 20.0000 meq | EXTENDED_RELEASE_TABLET | Freq: Two times a day (BID) | ORAL | 1 refills | Status: DC

## 2019-03-01 MED ORDER — TORSEMIDE 20 MG PO TABS
40.0000 mg | ORAL_TABLET | Freq: Every day | ORAL | Status: DC
  Administered 2019-03-01: 40 mg via ORAL
  Filled 2019-03-01: qty 2

## 2019-03-01 MED ORDER — PANTOPRAZOLE SODIUM 40 MG PO TBEC: 40.0000 mg | DELAYED_RELEASE_TABLET | Freq: Two times a day (BID) | ORAL | 2 refills | Status: DC

## 2019-03-01 MED ORDER — POTASSIUM CHLORIDE CRYS ER 20 MEQ PO TBCR
40.0000 meq | EXTENDED_RELEASE_TABLET | Freq: Once | ORAL | Status: AC
  Administered 2019-03-01: 40 meq via ORAL

## 2019-03-01 MED ORDER — TORSEMIDE 20 MG PO TABS: 40.0000 mg | ORAL_TABLET | Freq: Every day | ORAL | 0 refills | Status: DC

## 2019-03-01 NOTE — Progress Notes (Signed)
[facility]  ROUNDING NOTE   Subjective:   Refusing some medications this morning.   UOP 1350 (1325)  No new labs  Furosemide 6mg /hr gtt  Objective:  Vital signs in last 24 hours:  Temp:  [97.9 F (36.6 C)-98.3 F (36.8 C)] 97.9 F (36.6 C) (03/01 0829) Pulse Rate:  [46-84] 69 (03/01 0829) Resp:  [18-19] 19 (03/01 0416) BP: (98-112)/(84-98) 112/98 (03/01 0829) SpO2:  [96 %-100 %] 97 % (03/01 0829) Weight:  [101.9 kg] 101.9 kg (03/01 0416)  Weight change: 0.816 kg Filed Weights   02/27/19 0537 02/28/19 0555 03/01/19 0416  Weight: 103.1 kg 101.1 kg 101.9 kg    Intake/Output: I/O last 3 completed shifts: In: 860 [P.O.:860] Out: 2475 [Urine:2475]   Intake/Output this shift:  No intake/output data recorded.  Physical Exam: General: No acute distress  Head: Normocephalic, atraumatic. Moist oral mucosal membranes  Eyes: Anicteric  Neck: Supple, trachea midline  Lungs:  Crackles basilar bilaterally  Heart: regular  Abdomen:  Soft, nontender, bowel sounds present  Extremities: trace peripheral edema.  Neurologic: Awake, alert, following commands  Skin: No lesions       Basic Metabolic Panel: Recent Labs  Lab 02/24/19 0452 02/25/19 0449 02/26/19 0554 02/27/19 0422 02/28/19 0537  NA 139 141 140 143 141  K 3.4* 3.4* 3.2* 3.6 3.2*  CL 107 107 106 109 107  CO2 23 24 23  21* 24  GLUCOSE 97 107* 110* 88 98  BUN 42* 42* 39* 38* 38*  CREATININE 2.64* 2.48* 2.22* 2.33* 2.18*  CALCIUM 9.1 9.1 9.0 9.1 8.8*  MG  --   --   --  1.7 2.0  PHOS  --   --   --  3.4  --     Liver Function Tests: Recent Labs  Lab 02/23/19 0622 02/24/19 0452  AST 14* 16  ALT 16 19  ALKPHOS 73 70  BILITOT 0.9 1.0  PROT 6.6 6.3*  ALBUMIN 3.3* 3.2*   No results for input(s): LIPASE, AMYLASE in the last 168 hours. No results for input(s): AMMONIA in the last 168 hours.  CBC: Recent Labs  Lab 02/26/19 0554  WBC 7.4  HGB 12.1*  HCT 37.9*  MCV 89.0  PLT 210     Cardiac Enzymes: No results for input(s): CKTOTAL, CKMB, CKMBINDEX, TROPONINI in the last 168 hours.  BNP: Invalid input(s): POCBNP  CBG: No results for input(s): GLUCAP in the last 168 hours.  Microbiology: Results for orders placed or performed during the hospital encounter of 02/17/19  Gastrointestinal Panel by PCR , Stool     Status: None   Collection Time: 02/18/19  8:27 PM  Result Value Ref Range Status   Campylobacter species NOT DETECTED NOT DETECTED Final   Plesimonas shigelloides NOT DETECTED NOT DETECTED Final   Salmonella species NOT DETECTED NOT DETECTED Final   Yersinia enterocolitica NOT DETECTED NOT DETECTED Final   Vibrio species NOT DETECTED NOT DETECTED Final   Vibrio cholerae NOT DETECTED NOT DETECTED Final   Enteroaggregative E coli (EAEC) NOT DETECTED NOT DETECTED Final   Enteropathogenic E coli (EPEC) NOT DETECTED NOT DETECTED Final   Enterotoxigenic E coli (ETEC) NOT DETECTED NOT DETECTED Final   Shiga like toxin producing E coli (STEC) NOT DETECTED NOT DETECTED Final   Shigella/Enteroinvasive E coli (EIEC) NOT DETECTED NOT DETECTED Final   Cryptosporidium NOT DETECTED NOT DETECTED Final   Cyclospora cayetanensis NOT DETECTED NOT DETECTED Final   Entamoeba histolytica NOT DETECTED NOT DETECTED Final  Giardia lamblia NOT DETECTED NOT DETECTED Final   Adenovirus F40/41 NOT DETECTED NOT DETECTED Final   Astrovirus NOT DETECTED NOT DETECTED Final   Norovirus GI/GII NOT DETECTED NOT DETECTED Final   Rotavirus A NOT DETECTED NOT DETECTED Final   Sapovirus (I, II, IV, and V) NOT DETECTED NOT DETECTED Final    Comment: Performed at [facility], [address] [place], [place] [address]    Coagulation Studies: No results for input(s): LABPROT, INR in the last 72 hours.  Urinalysis: No results for input(s): COLORURINE, LABSPEC, PHURINE, GLUCOSEU, HGBUR, BILIRUBINUR, KETONESUR, PROTEINUR, UROBILINOGEN, NITRITE, LEUKOCYTESUR in the last 72  hours.  Invalid input(s): APPERANCEUR    Imaging: No results found.   Medications:   . sodium chloride 500 mL (02/27/19 1057)  . furosemide (LASIX) infusion 6 mg/hr (02/28/19 0631)   . amiodarone  200 mg Oral Daily  . aspirin EC  81 mg Oral Daily  . atorvastatin  80 mg Oral q1800  . carvedilol  3.125 mg Oral BID WC  . clopidogrel  75 mg Oral Daily  . colchicine  0.6 mg Oral Daily  . diclofenac sodium  4 g Topical QID  . docusate sodium  100 mg Oral BID  . enoxaparin (LOVENOX) injection  40 mg Subcutaneous Q24H  . feeding supplement (NEPRO CARB STEADY)  237 mL Oral BID BM  . methimazole  50 mg Oral Daily  . metoCLOPramide (REGLAN) injection  5 mg Intravenous Q12H  . mometasone-formoterol  2 puff Inhalation BID  . multivitamin with minerals  1 tablet Oral Daily  . pantoprazole  40 mg Oral BID  . polyethylene glycol  17 g Oral Daily  . potassium chloride  20 mEq Oral BID  . sodium chloride flush  3 mL Intravenous Once  . tiotropium  1 capsule Inhalation Daily   sodium chloride, acetaminophen **OR** acetaminophen, bisacodyl, loperamide, ondansetron **OR** ondansetron (ZOFRAN) IV, promethazine  Assessment/ Plan:    [name] is a 53 y.o. black male chronic systolic heart failure ejection fraction 10-15%, prior history of cardiac arrest, hyperlipidemia, nephrolithiasis, hypertension, history of TIA, was admitted to [facility] on 02/17/2019    1.  Acute renal failure: secondary to acute cardiorenal syndrome 2.  Chronic kidney disease stage II baseline creatinine 1.85, GFR of 47 on 01/24/19. Bland urine.  3.  Acute on Chronic systolic heart failure. 4.  Hypokalemia.  5. Hyperthyroidism  Plan - Discontinue furosemide gtt. Transition to PO torsemide. 40mg  daily PO.  - Encouraged patient to take methimazole.  - Holding entresto and metolazone.  - Pending labs today.    LOS: 12 Stephen [name] 3/1/202010:06 AM

## 2019-03-01 NOTE — Progress Notes (Signed)
Patient was looking much better this morning.  Then he began vomiting.  RN is monitoring the patient.  MD was notified.   Phillis Knack, RN

## 2019-03-01 NOTE — Discharge Summary (Signed)
[name] NAME: Shadman Tozzi    MR#:  [id number]  DATE OF BIRTH:  11-11-1966  DATE OF ADMISSION:  02/17/2019 ADMITTING PHYSICIAN: [name], MD  DATE OF DISCHARGE: 03/01/2019 PRIMARY CARE PHYSICIAN: Valerie Roys, DO    ADMISSION DIAGNOSIS:  Elevated troponin I level [R79.89] Intractable vomiting with nausea, unspecified vomiting type [R11.2]  DISCHARGE DIAGNOSIS:  acute on chronic systolic congestive heart failure severe cardiomyopathy EF of 10 to 15%  SECONDARY DIAGNOSIS:   Past Medical History:  Diagnosis Date  . AICD (automatic cardioverter/defibrillator) present   . AKI (acute kidney injury) ([place]) 12/24/2017  . Arrhythmia   . Arthritis    "lower back, knees" (06/10/2018)  . Cardiac arrest ([place]) 12/23/2017   Brief V-fib arrest  . Chest pain 09/08/2017  . CHF (congestive heart failure) ([facility])    nonischemic cardiomyopathy, EF 25%  . Gout    "on daily RX" (06/10/2018)  . Headache    "q couple months" (06/10/2018)  . High cholesterol   . History of kidney stones   . Hypertension   . Influenza A 12/24/2017  . NICM (nonischemic cardiomyopathy) ([place])   . Pneumonia 12/20/2017  . TIA (transient ischemic attack) 06/21/2016   "still affects my memory a little bit" (06/10/2018)    HOSPITAL COURSE:   *Acute on chronic systolic congestive heart failure with history of severe cardiomyopathy/hx MI/chronic combined CHF ejection fraction 10- status post - -pt has AICD -X-ray with fluid overload  -patient was started on Lasix drip for several days -total urine output over 24 hours is 7.5 L. Weight apparently decreased -cardiology consult to [facility] cardiology-Dr. Nehemiah Massed is following.   -Holding entresto till seen by Dr Holley Raring -resumed metolazone and torsemide -Continue aspirin, Plavix, Coreg, amiodarone  *Acute kidney injury on chronic kidney disease- likely due to nausea, vomiting, diarrhea.   -Creatinine is  getting worse 2.20-2.32-2.65--2.64-2.22-2.33--2.18 - previously it was at 1.6-2.0 -Renal ultrasound with medical renal disease -with ejection fraction 10 to 15%, echocardiogram during this admission has revealed 10% EF, did not reveal any pericardial effusion -Holding entresto -Avoid nephrotoxins -Nephrology Dr Juleen China ok with pt going home  *Nausea/vomiting/diarrhea-clinically improving  abdominal x-ray unremarkable.   -Zofran and Phenergan as needed Compazine added to the regimen -Tramadol discontinued as narcotics might slow down GI motility  -CT abdomen and pelvis ordered-likely cirrhosis -Abdominal ultrasound -did not reveal ascites -Stool PCR negative -GI -seen by Dr. Vicente Males, recommending conservative management in view of his cardiomyopathy with ejection fraction 10 %.  EGD does not change his management per GI -PPI-increased to twice daily  *Left ankle pain- patient states that this feels like his regular gout.  No erythema, edema, or warmth on exam.  Patient has full range of motion. -Continue colchicine -Add heating pad and Voltaren gel, as patient states this usually helps him a lot. -Normal left ankle x-ray  *Elevated troponin- likely demand ischemia in the setting of AKI.  Troponins mildly elevated and flat.  No active chest pain.  *Chronic COPD- currently with no exacerbation -Continue home inhalers -DuoNebs PRN  *History of hyperthyroidism- TSH undetectable, T4 mildly elevated, T3 normal. -Increase methimazole to 50 mg daily -Needs to follow-up with endocrinology on discharge  Long term Poor prognosis  D/c home  CONSULTS OBTAINED:  Treatment Team:  Teodoro Spray, MD Murlean Iba, MD Corey Skains, MD  DRUG ALLERGIES:   Allergies  Allergen Reactions  . Lisinopril Cough    DISCHARGE  MEDICATIONS:   Allergies as of 03/01/2019      Reactions   Lisinopril Cough      Medication List    STOP taking these medications   meloxicam 15 MG  tablet Commonly known as:  MOBIC   sacubitril-valsartan 49-51 MG Commonly known as:  ENTRESTO     TAKE these medications   acetaminophen 325 MG tablet Commonly known as:  TYLENOL Take 2 tablets (650 mg total) by mouth every 6 (six) hours as needed. What changed:    how much to take  reasons to take this   albuterol 108 (90 Base) MCG/ACT inhaler Commonly known as:  PROVENTIL HFA;VENTOLIN HFA Inhale 2 puffs into the lungs every 4 (four) hours as needed for wheezing or shortness of breath.   allopurinol 100 MG tablet Commonly known as:  ZYLOPRIM Take 2 tablets (200 mg total) by mouth daily. What changed:    when to take this  reasons to take this   amiodarone 200 MG tablet Commonly known as:  PACERONE Take 1 tablet (200 mg total) by mouth daily.   aspirin 81 MG EC tablet Take 1 tablet (81 mg total) by mouth daily.   atorvastatin 40 MG tablet Commonly known as:  LIPITOR Take 80 mg by mouth daily at 6 PM.   CAMPHOR-EUCALYPTUS-MENTHOL EX Apply 1 application topically as needed (congestion).   carvedilol 3.125 MG tablet Commonly known as:  COREG Take 1 tablet (3.125 mg total) by mouth 2 (two) times daily with a meal. What changed:    how much to take  when to take this   clopidogrel 75 MG tablet Commonly known as:  PLAVIX TAKE ONE TABLET BY MOUTH EVERY DAY   colchicine 0.6 MG tablet Take 1 tablet (0.6 mg total) by mouth daily as needed (gout flare).   cyclobenzaprine 5 MG tablet Commonly known as:  FLEXERIL Take 1 tablet (5 mg total) by mouth 3 (three) times daily as needed (jaw pain).   diclofenac sodium 1 % Gel Commonly known as:  VOLTAREN Apply 2 g topically 4 (four) times daily as needed (pain).   famotidine 20 MG tablet Commonly known as:  PEPCID Take 1 tablet (20 mg total) by mouth 2 (two) times daily for 15 days.   fluticasone 50 MCG/ACT nasal spray Commonly known as:  FLONASE Place 2 sprays into both nostrils daily. What changed:    when  to take this  reasons to take this   fluticasone-salmeterol 115-21 MCG/ACT inhaler Commonly known as:  ADVAIR HFA Inhale 2 puffs into the lungs 2 (two) times daily.   ICY HOT EX Apply 1 application topically daily as needed (pain).   methimazole 10 MG tablet Commonly known as:  TAPAZOLE Take 4 tablets (40 mg total) by mouth daily.   metolazone 2.5 MG tablet Commonly known as:  ZAROXOLYN Take 2.5 mg by mouth as needed.   multivitamin with minerals Tabs tablet Take 1 tablet by mouth daily.   nitroGLYCERIN 0.4 MG SL tablet Commonly known as:  NITROSTAT Place 1 tablet (0.4 mg total) under the tongue every 5 (five) minutes as needed for chest pain.   ondansetron 4 MG disintegrating tablet Commonly known as:  ZOFRAN ODT Take 1 tablet (4 mg total) by mouth every 8 (eight) hours as needed for nausea or vomiting.   pantoprazole 40 MG tablet Commonly known as:  PROTONIX Take 1 tablet (40 mg total) by mouth 2 (two) times daily.   potassium chloride SA 20 MEQ tablet Commonly known  as:  K-DUR,KLOR-CON Take 1 tablet (20 mEq total) by mouth 2 (two) times daily. What changed:  when to take this   promethazine 12.5 MG tablet Commonly known as:  PHENERGAN Take 1 tablet (12.5 mg total) by mouth every 6 (six) hours as needed for nausea or vomiting.   Tiotropium Bromide Monohydrate 2.5 MCG/ACT Aers Commonly known as:  SPIRIVA RESPIMAT Inhale 1 puff into the lungs daily.   torsemide 20 MG tablet Commonly known as:  DEMADEX Take 2 tablets (40 mg total) by mouth daily. What changed:  when to take this       If you experience worsening of your admission symptoms, develop shortness of breath, life threatening emergency, suicidal or homicidal thoughts you must seek medical attention immediately by calling 911 or calling your MD immediately  if symptoms less severe.  You Must read complete instructions/literature along with all the possible adverse reactions/side effects for all the  Medicines you take and that have been prescribed to you. Take any new Medicines after you have completely understood and accept all the possible adverse reactions/side effects.   Please note  You were cared for by a hospitalist during your hospital stay. If you have any questions about your discharge medications or the care you received while you were in the hospital after you are discharged, you can call the unit and asked to speak with the hospitalist on call if the hospitalist that took care of you is not available. Once you are discharged, your primary care physician will handle any further medical issues. Please note that NO REFILLS for any discharge medications will be authorized once you are discharged, as it is imperative that you return to your primary care physician (or establish a relationship with a primary care physician if you do not have one) for your aftercare needs so that they can reassess your need for medications and monitor your lab values. Today   SUBJECTIVE   Eating better. Doing well  VITAL SIGNS:  Blood pressure (!) 112/98, pulse 69, temperature 97.9 F (36.6 C), temperature source Oral, resp. rate 19, height 6\' 1"  (1.854 m), weight 101.9 kg, SpO2 97 %.  I/O:    Intake/Output Summary (Last 24 hours) at 03/01/2019 1144 Last data filed at 03/01/2019 1010 Gross per 24 hour  Intake 720 ml  Output 1350 ml  Net -630 ml    PHYSICAL EXAMINATION:  GENERAL:  53 y.o.-year-old patient lying in the bed with no acute distress.  EYES: Pupils equal, round, reactive to light and accommodation. No scleral icterus. Extraocular muscles intact.  HEENT: Head atraumatic, normocephalic. Oropharynx and nasopharynx clear.  NECK:  Supple, no jugular venous distention. No thyroid enlargement, no tenderness.  LUNGS: Normal breath sounds bilaterally, no wheezing, rales,rhonchi or crepitation. No use of accessory muscles of respiration.  CARDIOVASCULAR: S1, S2 normal. No murmurs, rubs, or  gallops.  ABDOMEN: Soft, non-tender, non-distended. Bowel sounds present. No organomegaly or mass.  EXTREMITIES: No pedal edema, cyanosis, or clubbing.  NEUROLOGIC: Cranial nerves II through XII are intact. Muscle strength 5/5 in all extremities. Sensation intact. Gait not checked.  PSYCHIATRIC: The patient is alert and oriented x 3.  SKIN: No obvious rash, lesion, or ulcer.   DATA REVIEW:   CBC  Recent Labs  Lab 02/26/19 0554  WBC 7.4  HGB 12.1*  HCT 37.9*  PLT 210    Chemistries  Recent Labs  Lab 02/24/19 0452  02/28/19 0537  NA 139   < > 141  K 3.4*   < >  3.2*  CL 107   < > 107  CO2 23   < > 24  GLUCOSE 97   < > 98  BUN 42*   < > 38*  CREATININE 2.64*   < > 2.18*  CALCIUM 9.1   < > 8.8*  MG  --    < > 2.0  AST 16  --   --   ALT 19  --   --   ALKPHOS 70  --   --   BILITOT 1.0  --   --    < > = values in this interval not displayed.    Microbiology Results   No results found for this or any previous visit (from the past 240 hour(s)).  RADIOLOGY:  No results found.   CODE STATUS:     Code Status Orders  (From admission, onward)         Start     Ordered   02/18/19 0022  Full code  Continuous     02/18/19 0021        Code Status History    Date Active Date Inactive Code Status Order ID Comments User Context   02/13/2019 1451 02/14/2019 2116 Full Code [id number]  Hillary Bow, MD ED   12/27/2018 0600 12/28/2018 1829 Partial Code [id number]  Harrie Foreman, MD Inpatient   12/04/2018 0437 12/10/2018 2037 Partial Code [id number]  Bradly Bienenstock, NP Inpatient   12/04/2018 0147 12/04/2018 0437 Full Code [id number]  Henreitta Leber, MD ED   08/19/2018 2248 08/20/2018 2258 Full Code [id number]  Amelia Jo, MD Inpatient   06/30/2018 1757 07/06/2018 1647 Full Code [id number]  [name], MD Inpatient   06/10/2018 1606 06/11/2018 1537 Full Code [id number]  Thompson Grayer, MD Inpatient   12/23/2017 1153 12/31/2017 1729 Full Code [id number]  Erlene Quan, PA-C  Inpatient   12/20/2017 1851 12/23/2017 1110 Full Code [id number]  [name], MD Inpatient   09/08/2017 0644 09/10/2017 2127 Full Code [id number]  Harrie Foreman, MD Inpatient   07/29/2017 1044 07/31/2017 2152 Full Code [id number]  Henreitta Leber, MD Inpatient   06/19/2017 0027 06/20/2017 2114 Full Code [id number]  Lance Coon, MD ED   05/20/2017 1226 05/21/2017 1759 Full Code [id number]  [name], MD Inpatient   06/22/2016 0430 06/22/2016 2150 Full Code [id number]  Quintella Baton, MD Inpatient   06/04/2016 1843 06/06/2016 1815 Full Code [id number]  [name], MD Inpatient      TOTAL TIME TAKING CARE OF THIS PATIENT: *40* minutes.    Fritzi Mandes M.D on 03/01/2019 at 11:44 AM  Between 7am to 6pm - Pager - [phone] After 6pm go to [email or web address] - password [email or web address] [place] [facility]  Office  [phone]  CC: Primary care physician; Valerie Roys, DO

## 2019-03-02 ENCOUNTER — Telehealth: Payer: Self-pay

## 2019-03-02 NOTE — Telephone Encounter (Signed)
I have made the 2nd attempt to contact the patient or family member in charge, in order to follow up from recently being discharged from the hospital.  

## 2019-03-02 NOTE — Telephone Encounter (Signed)
I have made the 1st attempt to contact the patient or family member in charge, in order to follow up from recently being discharged from the hospital. I left a message on voicemail but I will make another attempt at a different time.  

## 2019-03-04 ENCOUNTER — Ambulatory Visit: Payer: Self-pay | Admitting: Family

## 2019-03-05 DIAGNOSIS — Z87891 Personal history of nicotine dependence: Secondary | ICD-10-CM | POA: Diagnosis not present

## 2019-03-05 DIAGNOSIS — Z95810 Presence of automatic (implantable) cardiac defibrillator: Secondary | ICD-10-CM | POA: Diagnosis not present

## 2019-03-05 DIAGNOSIS — I5022 Chronic systolic (congestive) heart failure: Secondary | ICD-10-CM | POA: Diagnosis not present

## 2019-03-05 DIAGNOSIS — R42 Dizziness and giddiness: Secondary | ICD-10-CM | POA: Diagnosis not present

## 2019-03-05 DIAGNOSIS — I517 Cardiomegaly: Secondary | ICD-10-CM | POA: Diagnosis not present

## 2019-03-05 DIAGNOSIS — R55 Syncope and collapse: Secondary | ICD-10-CM | POA: Diagnosis not present

## 2019-03-05 DIAGNOSIS — I110 Hypertensive heart disease with heart failure: Secondary | ICD-10-CM | POA: Diagnosis not present

## 2019-03-06 DIAGNOSIS — R55 Syncope and collapse: Secondary | ICD-10-CM | POA: Diagnosis not present

## 2019-03-06 DIAGNOSIS — I110 Hypertensive heart disease with heart failure: Secondary | ICD-10-CM | POA: Diagnosis not present

## 2019-03-06 DIAGNOSIS — Z87891 Personal history of nicotine dependence: Secondary | ICD-10-CM | POA: Diagnosis not present

## 2019-03-06 DIAGNOSIS — I5022 Chronic systolic (congestive) heart failure: Secondary | ICD-10-CM | POA: Diagnosis not present

## 2019-03-06 DIAGNOSIS — Z95810 Presence of automatic (implantable) cardiac defibrillator: Secondary | ICD-10-CM | POA: Diagnosis not present

## 2019-03-12 ENCOUNTER — Encounter: Payer: Self-pay | Admitting: Pharmacist

## 2019-03-12 ENCOUNTER — Ambulatory Visit: Payer: Commercial Managed Care - PPO | Attending: Family | Admitting: Family

## 2019-03-12 ENCOUNTER — Other Ambulatory Visit: Payer: Self-pay

## 2019-03-12 ENCOUNTER — Encounter: Payer: Self-pay | Admitting: Family

## 2019-03-12 VITALS — BP 88/68 | HR 67 | Resp 18 | Ht 73.0 in | Wt 228.2 lb

## 2019-03-12 DIAGNOSIS — M1990 Unspecified osteoarthritis, unspecified site: Secondary | ICD-10-CM | POA: Insufficient documentation

## 2019-03-12 DIAGNOSIS — J449 Chronic obstructive pulmonary disease, unspecified: Secondary | ICD-10-CM | POA: Insufficient documentation

## 2019-03-12 DIAGNOSIS — E7800 Pure hypercholesterolemia, unspecified: Secondary | ICD-10-CM | POA: Insufficient documentation

## 2019-03-12 DIAGNOSIS — Z7982 Long term (current) use of aspirin: Secondary | ICD-10-CM | POA: Insufficient documentation

## 2019-03-12 DIAGNOSIS — G479 Sleep disorder, unspecified: Secondary | ICD-10-CM | POA: Diagnosis not present

## 2019-03-12 DIAGNOSIS — Z8673 Personal history of transient ischemic attack (TIA), and cerebral infarction without residual deficits: Secondary | ICD-10-CM | POA: Insufficient documentation

## 2019-03-12 DIAGNOSIS — Z8674 Personal history of sudden cardiac arrest: Secondary | ICD-10-CM | POA: Insufficient documentation

## 2019-03-12 DIAGNOSIS — E785 Hyperlipidemia, unspecified: Secondary | ICD-10-CM | POA: Diagnosis not present

## 2019-03-12 DIAGNOSIS — I472 Ventricular tachycardia: Secondary | ICD-10-CM | POA: Diagnosis not present

## 2019-03-12 DIAGNOSIS — N189 Chronic kidney disease, unspecified: Secondary | ICD-10-CM | POA: Diagnosis not present

## 2019-03-12 DIAGNOSIS — Z8249 Family history of ischemic heart disease and other diseases of the circulatory system: Secondary | ICD-10-CM | POA: Insufficient documentation

## 2019-03-12 DIAGNOSIS — I5022 Chronic systolic (congestive) heart failure: Secondary | ICD-10-CM

## 2019-03-12 DIAGNOSIS — K5900 Constipation, unspecified: Secondary | ICD-10-CM | POA: Insufficient documentation

## 2019-03-12 DIAGNOSIS — Z87891 Personal history of nicotine dependence: Secondary | ICD-10-CM | POA: Insufficient documentation

## 2019-03-12 DIAGNOSIS — I130 Hypertensive heart and chronic kidney disease with heart failure and stage 1 through stage 4 chronic kidney disease, or unspecified chronic kidney disease: Secondary | ICD-10-CM | POA: Insufficient documentation

## 2019-03-12 DIAGNOSIS — I10 Essential (primary) hypertension: Secondary | ICD-10-CM

## 2019-03-12 DIAGNOSIS — Z79899 Other long term (current) drug therapy: Secondary | ICD-10-CM | POA: Insufficient documentation

## 2019-03-12 DIAGNOSIS — M109 Gout, unspecified: Secondary | ICD-10-CM | POA: Insufficient documentation

## 2019-03-12 DIAGNOSIS — R0602 Shortness of breath: Secondary | ICD-10-CM | POA: Diagnosis present

## 2019-03-12 DIAGNOSIS — J410 Simple chronic bronchitis: Secondary | ICD-10-CM

## 2019-03-12 NOTE — Patient Instructions (Addendum)
Continue weighing daily and call for an overnight weight gain of > 2 pounds or a weekly weight gain of >5 pounds.  Ask [name] to check potassium and magnesium levels.

## 2019-03-12 NOTE — Progress Notes (Signed)
Patient ID: Stephen [name], male    DOB: Sep 26, 1966, 53 y.o.   MRN: [id number]  HPI  Stephen [name] is a 53 y/o male with a history of TIA, HTN, CKD, hyperlipidemia, cardiac arrest, gout, previous tobacco use and chronic heart failure.   Echo report from 02/24/2019 reviewed and showed an EF of 10% along with moderate MR and trivial AR. Echo report from 06/30/18 reviewed and showed an EF of 10-15% along with mild MR. Echo done 05/21/17 showed an EF of 20-25% which is stable from previous echo which was done 06/22/16 with an EF of 20%, mild MR, no aortic stenosis or regurgitation.   Cardiac catheterization done 09/09/17 revealing normal coronary arteries.  In the last 6 months, he's been admitted 4 times and been to the ED 5 times with the most ED visit being 03/05/2019.  Patient presents today for a follow-up visit with a chief complaint of minimal shortness of breath upon moderate exertion. He describes this as chronic in nature having been present for several years. He has associated cough, constipation and difficulty sleeping along with this. He denies any dizziness, abdominal distention, palpitations, pedal edema, chest pain, wheezing, fatigue or weight gain. Has metolazone that he can take as needed but rarely takes it.   Past Medical History:  Diagnosis Date  . AICD (automatic cardioverter/defibrillator) present   . AKI (acute kidney injury) ([place]) 12/24/2017  . Arrhythmia   . Arthritis    "lower back, knees" (06/10/2018)  . Cardiac arrest ([place]) 12/23/2017   Brief V-fib arrest  . Chest pain 09/08/2017  . CHF (congestive heart failure) ([facility])    nonischemic cardiomyopathy, EF 25%  . Gout    "on daily RX" (06/10/2018)  . Headache    "q couple months" (06/10/2018)  . High cholesterol   . History of kidney stones   . Hypertension   . Influenza A 12/24/2017  . NICM (nonischemic cardiomyopathy) ([place])   . Pneumonia 12/20/2017  . TIA (transient ischemic attack) 06/21/2016   "still affects my  memory a little bit" (06/10/2018)   Past Surgical History:  Procedure Laterality Date  . EXTERNAL FIXATION LEG Right ~ 2000   "was going bowlegged; had to brake my leg to fix it"  . HERNIA REPAIR    . ICD IMPLANT N/A 12/30/2017   Procedure: ICD IMPLANT;  Surgeon: Deboraha Sprang, MD;  Location: [facility];  Service: Cardiovascular;  Laterality: N/A;  . LEFT HEART CATH AND CORONARY ANGIOGRAPHY N/A 09/09/2017   Procedure: LEFT HEART CATH AND CORONARY ANGIOGRAPHY;  Surgeon: Teodoro Spray, MD;  Location: [facility];  Service: Cardiovascular;  Laterality: N/A;  . UMBILICAL HERNIA REPAIR  1990s  . V TACH ABLATION  06/10/2018  . V TACH ABLATION N/A 06/10/2018   Procedure: V TACH ABLATION;  Surgeon: Thompson Grayer, MD;  Location: [facility];  Service: Cardiovascular;  Laterality: N/A;  . VASECTOMY      Family History  Problem Relation Age of Onset  . Hypertension Mother   . Heart failure Mother   . Hypertension Father   . CAD Father   . Heart attack Father     Social History   Tobacco Use  . Smoking status: Former Smoker    Packs/day: 0.33    Years: 33.00    Pack years: 10.89    Types: Cigarettes    Last attempt to quit: 02/20/2016    Years since quitting: 3.0  . Smokeless tobacco: Never  Used  Substance Use Topics  . Alcohol use: Yes    Alcohol/week: 0.0 standard drinks    Comment: 06/10/2018 "beer once/month; if that"    Allergies  Allergen Reactions  . Lisinopril Cough   Prior to Admission medications   Medication Sig Start Date End Date Taking? Authorizing Provider  acetaminophen (TYLENOL) 325 MG tablet Take 2 tablets (650 mg total) by mouth every 6 (six) hours as needed. Patient taking differently: Take 325-650 mg by mouth every 6 (six) hours as needed for moderate pain.  03/19/18  Yes Carrie Mew, MD  albuterol (PROVENTIL HFA;VENTOLIN HFA) 108 (90 Base) MCG/ACT inhaler Inhale 2 puffs into the lungs every 4 (four) hours as needed for wheezing  or shortness of breath. 09/19/18  Yes Johnson, Megan P, DO  allopurinol (ZYLOPRIM) 100 MG tablet Take 2 tablets (200 mg total) by mouth daily. Patient taking differently: Take 200 mg by mouth daily as needed.  10/19/18  Yes Johnson, Megan P, DO  amiodarone (PACERONE) 200 MG tablet Take 1 tablet (200 mg total) by mouth daily. Patient taking differently: Take 400 mg by mouth daily.  01/20/19  Yes Deboraha Sprang, MD  aspirin EC 81 MG EC tablet Take 1 tablet (81 mg total) by mouth daily. 01/01/18  Yes Daune Perch, NP  atorvastatin (LIPITOR) 40 MG tablet Take 80 mg by mouth daily at 6 PM.  10/29/18  Yes [provider]  CAMPHOR-EUCALYPTUS-MENTHOL EX Apply 1 application topically as needed (congestion).   Yes [provider]  carvedilol (COREG) 3.125 MG tablet Take 1 tablet (3.125 mg total) by mouth 2 (two) times daily with a meal. Patient taking differently: Take 6.25 mg by mouth 2 (two) times daily with a meal.  07/06/18  Yes Wieting, Richard, MD  clopidogrel (PLAVIX) 75 MG tablet TAKE ONE TABLET BY MOUTH EVERY DAY Patient taking differently: Take 75 mg by mouth daily.  02/13/19  Yes Bensimhon, Shaune Pascal, MD  colchicine 0.6 MG tablet Take 1 tablet (0.6 mg total) by mouth daily as needed (gout flare). 09/03/18  Yes Johnson, Megan P, DO  cyclobenzaprine (FLEXERIL) 5 MG tablet Take 1 tablet (5 mg total) by mouth 3 (three) times daily as needed (jaw pain). 11/24/18  Yes Nance Pear, MD  diclofenac sodium (VOLTAREN) 1 % GEL Apply 2 g topically 4 (four) times daily as needed (pain).    Yes [provider]  famotidine (PEPCID) 20 MG tablet Take 1 tablet (20 mg total) by mouth 2 (two) times daily for 15 days. Patient taking differently: Take 20 mg by mouth daily as needed.  02/16/19 03/12/19 Yes Arta Silence, MD  fluticasone (FLONASE) 50 MCG/ACT nasal spray Place 2 sprays into both nostrils daily. Patient taking differently: Place 2 sprays into both nostrils daily as needed for  allergies.  06/20/17  Yes [name], MD  fluticasone-salmeterol (ADVAIR HFA) [id number] MCG/ACT inhaler Inhale 2 puffs into the lungs 2 (two) times daily. 08/12/18  Yes [name], MD  magnesium oxide (MAG-OX) 400 MG tablet Take 400 mg by mouth 2 (two) times daily. Total 15 days   Yes [provider]  Menthol, Topical Analgesic, (ICY HOT EX) Apply 1 application topically daily as needed (pain).   Yes [provider]  methimazole (TAPAZOLE) 10 MG tablet Take 4 tablets (40 mg total) by mouth daily. 12/09/18  Yes Mayo, Pete Pelt, MD  metolazone (ZAROXOLYN) 2.5 MG tablet Take 2.5 mg by mouth as needed.  09/05/18  Yes [provider]  Multiple Vitamin (  MULTIVITAMIN WITH MINERALS) TABS tablet Take 1 tablet by mouth daily.   Yes [provider]  nitroGLYCERIN (NITROSTAT) 0.4 MG SL tablet Place 1 tablet (0.4 mg total) under the tongue every 5 (five) minutes as needed for chest pain. 02/19/18  Yes Bensimhon, Shaune Pascal, MD  ondansetron (ZOFRAN ODT) 4 MG disintegrating tablet Take 1 tablet (4 mg total) by mouth every 8 (eight) hours as needed for nausea or vomiting. 01/24/19  Yes Harvest Dark, MD  pantoprazole (PROTONIX) 40 MG tablet Take 1 tablet (40 mg total) by mouth 2 (two) times daily. 03/01/19  Yes Fritzi Mandes, MD  potassium chloride SA (K-DUR,KLOR-CON) 20 MEQ tablet Take 1 tablet (20 mEq total) by mouth 2 (two) times daily. Patient taking differently: Take 20 mEq by mouth 4 (four) times daily.  03/01/19  Yes Fritzi Mandes, MD  promethazine (PHENERGAN) 12.5 MG tablet Take 1 tablet (12.5 mg total) by mouth every 6 (six) hours as needed for nausea or vomiting. 11/24/18  Yes Nance Pear, MD  sacubitril-valsartan (ENTRESTO) 49-51 MG Take 0.5 tablets by mouth 2 (two) times daily.   Yes [provider]  sennosides-docusate sodium (SENOKOT-S) 8.6-50 MG tablet Take 1 tablet by mouth daily.   Yes [provider]  sodium chloride (OCEAN) 0.65 % SOLN nasal spray  Place 1 spray into both nostrils as needed for congestion.   Yes [provider]  spironolactone (ALDACTONE) 25 MG tablet Take 12.5 mg by mouth daily.   Yes [provider]  Tiotropium Bromide Monohydrate (SPIRIVA RESPIMAT) 2.5 MCG/ACT AERS Inhale 1 puff into the lungs daily. 08/12/18  Yes [name], MD  torsemide (DEMADEX) 20 MG tablet Take 2 tablets (40 mg total) by mouth daily. Patient taking differently: Take 40 mg by mouth 2 (two) times daily.  03/01/19  Yes Fritzi Mandes, MD    Review of Systems  Constitutional: Negative for appetite change and fatigue.  HENT: Negative for congestion, postnasal drip and sore throat.   Eyes: Negative.   Respiratory: Positive for cough (productive) and shortness of breath (with moderate exertion). Negative for chest tightness and wheezing.   Cardiovascular: Negative for chest pain, palpitations and leg swelling.  Gastrointestinal: Positive for constipation. Negative for abdominal distention.  Endocrine: Negative.   Genitourinary: Negative.   Musculoskeletal: Positive for arthralgias (knees hurt) and back pain. Negative for neck pain.  Skin: Negative.   Allergic/Immunologic: Negative.   Neurological: Negative for dizziness and light-headedness.  Hematological: Negative for adenopathy. Does not bruise/bleed easily.  Psychiatric/Behavioral: Positive for sleep disturbance (sleeping ~ 2 hours at a time). Negative for dysphoric mood. The patient is not nervous/anxious.    Vitals:   03/12/19 1238 03/12/19 1300  BP: (!) 84/66 (!) 88/68  Pulse: 67   Resp: 18   SpO2: 100%   Weight: 228 lb 4 oz (103.5 kg)   Height: 6\' 1"  (1.854 m)    Wt Readings from Last 3 Encounters:  03/12/19 228 lb 4 oz (103.5 kg)  03/01/19 224 lb 9.6 oz (101.9 kg)  02/16/19 225 lb (102.1 kg)   Lab Results  Component Value Date   CREATININE 1.91 (H) 03/01/2019   CREATININE 2.18 (H) 02/28/2019   CREATININE 2.33 (H) 02/27/2019    Physical Exam  Constitutional:  He is oriented to person, place, and time. He appears well-developed and well-nourished.  HENT:  Head: Normocephalic and atraumatic.  Neck: Normal range of motion. Neck supple.  Cardiovascular: Normal rate and regular rhythm.  Pulmonary/Chest: Effort normal. He has no wheezes.  He has no rales.  Abdominal: Soft. He exhibits no distension. There is no abdominal tenderness.  Musculoskeletal:        General: No tenderness or edema.  Neurological: He is alert and oriented to person, place, and time.  Skin: Skin is warm and dry.  Psychiatric: He has a normal mood and affect. His behavior is normal. Thought content normal.  Nursing note and vitals reviewed.    Assessment & Plan:  1: Chronic heart failure with reduced ejection fraction-  - NYHA Class II - euvolemic - weighing daily and he was reminded to call for overnight weight gain of >2 pounds or a weekly weight gain of >5 pounds - weight down 7 pounds from last visit here 2 months ago - not adding salt; reminded to closely follow a 2000mg  sodium diet - says there's been discussion about LVAD (not interested) or transplant (would be interested);he is going to talk more in detail with cardiology on 03/16/2019 - BP limits ability to tirate entresto and/ or carvedilol - saw EP Caryl Comes) 01/20/2019 - saw cardiology Clayborn Bigness) 10/29/18  - patient reports receiving his flu/ pneumonia vaccines for this season - BNP 12/15/18 was 755.0 - sleep study done 09/02/18 - PharmD reconciled medications with the patient - will refer to paramedicine program  2: HTN-  - BP continues on the low side today; says at home he gets readings of 110's - may need to stop entresto in the future due to BP - BMP done 03/06/2019 reviewed and shows potassium 3.6, creatinine 1.9 and GFR 46 - saw PCP Wynetta Emery) 01/01/2019   3: COPD- - using inhalers - PFT's done 08/01/17 - saw pulmonologist ([place]) 08/12/18  Medication bottles were reviewed.  Return in 2 months or sooner for  any questions/problems before then.

## 2019-03-12 NOTE — Progress Notes (Signed)
[facility] - PHARMACIST COUNSELING NOTE  ADHERENCE ASSESSMENT  Adherence strategy: keeps all medications together   Do you ever forget to take your medication? [] Yes (1) [x] No (0)  Do you ever skip doses due to side effects? [] Yes (1) [x] No (0)  Do you have trouble affording your medicines? [] Yes (1) [x] No (0)  Are you ever unable to pick up your medication due to transportation difficulties? [] Yes (1) [x] No (0)  Do you ever stop taking your medications because you don't believe they are helping? [] Yes (1) [x] No (0)  Total score _0______    Recommendations given to patient about increasing adherence: None  Guideline-Directed Medical Therapy/Evidence Based Medicine    ACE/ARB/ARNI: Stephen [name] 49/51 mg twice daily (takes 1/2 tablet BID)   Beta Blocker: carvedilol 6.25 mg twice daily   Aldosterone Antagonist: spironolactone 12.5 mg daily Diuretic: torsemide 40 mg twice daily + metolazone 2.5 mg daily PRN    SUBJECTIVE   HPI: Here for follow up appointment. Has been in and out of the hospital several times for stomach bug and electrolyte imbalance. Still feels tired.  Past Medical History:  Diagnosis Date  . AICD (automatic cardioverter/defibrillator) present   . AKI (acute kidney injury) ([place]) 12/24/2017  . Arrhythmia   . Arthritis    "lower back, knees" (06/10/2018)  . Cardiac arrest ([place]) 12/23/2017   Brief V-fib arrest  . Chest pain 09/08/2017  . CHF (congestive heart failure) ([facility])    nonischemic cardiomyopathy, EF 25%  . Gout    "on daily RX" (06/10/2018)  . Headache    "q couple months" (06/10/2018)  . High cholesterol   . History of kidney stones   . Hypertension   . Influenza A 12/24/2017  . NICM (nonischemic cardiomyopathy) ([place])   . Pneumonia 12/20/2017  . TIA (transient ischemic attack) 06/21/2016   "still affects my memory a little bit" (06/10/2018)        OBJECTIVE    Vital signs: HR 67, BP 84/66, weight  (pounds) 228.4  ECHO: Date 06/30/18, EF 10-15%   BMP Latest Ref Rng & Units 03/01/2019 02/28/2019 02/27/2019  Glucose 70 - 99 mg/dL 156(H) 98 88  BUN 6 - 20 mg/dL 35(H) 38(H) 38(H)  Creatinine 0.61 - 1.24 mg/dL 1.91(H) 2.18(H) 2.33(H)  BUN/Creat Ratio 9 - 20 - - -  Sodium 135 - 145 mmol/L 138 141 143  Potassium 3.5 - 5.1 mmol/L 3.1(L) 3.2(L) 3.6  Chloride 98 - 111 mmol/L 105 107 109  CO2 22 - 32 mmol/L 23 24 21(L)  Calcium 8.9 - 10.3 mg/dL 8.7(L) 8.8(L) 9.1    ASSESSMENT 53 year old male with HFrEF. Stills feels a little tired and weak from being in and out of the hospital. Weighing daily and says he is losing weight. He denies dizziness or light-headedness. Measures BP at home and says it normally runs around 110s. BP low today but patient denies feeling symptomatic today or at home.   PLAN Continue current regimen. Patient has an appointment Monday with [name]. There is discussion around possible heart transplantation.    Time spent: 10 minutes  [place], Pharm.D. 03/12/2019 1:28 PM    Current Outpatient Medications:  .  acetaminophen (TYLENOL) 325 MG tablet, Take 2 tablets (650 mg total) by mouth every 6 (six) hours as needed. (Patient taking differently: Take 325-650 mg by mouth every 6 (six) hours as needed for moderate pain. ), Disp: 60 tablet, Rfl: 0 .  albuterol (PROVENTIL HFA;VENTOLIN  HFA) 108 (90 Base) MCG/ACT inhaler, Inhale 2 puffs into the lungs every 4 (four) hours as needed for wheezing or shortness of breath., Disp: 1 Inhaler, Rfl: 0 .  allopurinol (ZYLOPRIM) 100 MG tablet, Take 2 tablets (200 mg total) by mouth daily. (Patient taking differently: Take 200 mg by mouth daily as needed. ), Disp: 60 tablet, Rfl: 6 .  amiodarone (PACERONE) 200 MG tablet, Take 1 tablet (200 mg total) by mouth daily. (Patient taking differently: Take 400 mg by mouth daily. ), Disp: , Rfl:  .  aspirin EC 81 MG EC tablet, Take 1 tablet (81 mg total) by mouth daily., Disp: 90  tablet, Rfl: 3 .  atorvastatin (LIPITOR) 40 MG tablet, Take 80 mg by mouth daily at 6 PM. , Disp: , Rfl: 6 .  CAMPHOR-EUCALYPTUS-MENTHOL EX, Apply 1 application topically as needed (congestion)., Disp: , Rfl:  .  carvedilol (COREG) 3.125 MG tablet, Take 1 tablet (3.125 mg total) by mouth 2 (two) times daily with a meal. (Patient taking differently: Take 6.25 mg by mouth 2 (two) times daily with a meal. ), Disp: 60 tablet, Rfl: 0 .  clopidogrel (PLAVIX) 75 MG tablet, TAKE ONE TABLET BY MOUTH EVERY DAY (Patient taking differently: Take 75 mg by mouth daily. ), Disp: 30 tablet, Rfl: 5 .  colchicine 0.6 MG tablet, Take 1 tablet (0.6 mg total) by mouth daily as needed (gout flare)., Disp: 6 tablet, Rfl: 1 .  cyclobenzaprine (FLEXERIL) 5 MG tablet, Take 1 tablet (5 mg total) by mouth 3 (three) times daily as needed (jaw pain)., Disp: 20 tablet, Rfl: 0 .  diclofenac sodium (VOLTAREN) 1 % GEL, Apply 2 g topically 4 (four) times daily as needed (pain). , Disp: , Rfl:  .  famotidine (PEPCID) 20 MG tablet, Take 1 tablet (20 mg total) by mouth 2 (two) times daily for 15 days. (Patient taking differently: Take 20 mg by mouth daily as needed. ), Disp: 30 tablet, Rfl: 0 .  fluticasone (FLONASE) 50 MCG/ACT nasal spray, Place 2 sprays into both nostrils daily. (Patient taking differently: Place 2 sprays into both nostrils daily as needed for allergies. ), Disp: 16 g, Rfl: 2 .  fluticasone-salmeterol (ADVAIR HFA) 115-21 MCG/ACT inhaler, Inhale 2 puffs into the lungs 2 (two) times daily., Disp: 1 Inhaler, Rfl: 12 .  magnesium oxide (MAG-OX) 400 MG tablet, Take 400 mg by mouth 2 (two) times daily. Total 15 days, Disp: , Rfl:  .  Menthol, Topical Analgesic, (ICY HOT EX), Apply 1 application topically daily as needed (pain)., Disp: , Rfl:  .  methimazole (TAPAZOLE) 10 MG tablet, Take 4 tablets (40 mg total) by mouth daily., Disp: 120 tablet, Rfl: 0 .  metolazone (ZAROXOLYN) 2.5 MG tablet, Take 2.5 mg by mouth as needed. ,  Disp: , Rfl: 11 .  Multiple Vitamin (MULTIVITAMIN WITH MINERALS) TABS tablet, Take 1 tablet by mouth daily., Disp: , Rfl:  .  nitroGLYCERIN (NITROSTAT) 0.4 MG SL tablet, Place 1 tablet (0.4 mg total) under the tongue every 5 (five) minutes as needed for chest pain., Disp: 25 tablet, Rfl: 3 .  ondansetron (ZOFRAN ODT) 4 MG disintegrating tablet, Take 1 tablet (4 mg total) by mouth every 8 (eight) hours as needed for nausea or vomiting., Disp: 20 tablet, Rfl: 0 .  pantoprazole (PROTONIX) 40 MG tablet, Take 1 tablet (40 mg total) by mouth 2 (two) times daily., Disp: 60 tablet, Rfl: 2 .  potassium chloride SA (K-DUR,KLOR-CON) 20 MEQ tablet, Take 1 tablet (20  mEq total) by mouth 2 (two) times daily. (Patient taking differently: Take 20 mEq by mouth 4 (four) times daily. ), Disp: 30 tablet, Rfl: 1 .  promethazine (PHENERGAN) 12.5 MG tablet, Take 1 tablet (12.5 mg total) by mouth every 6 (six) hours as needed for nausea or vomiting., Disp: 20 tablet, Rfl: 0 .  sacubitril-valsartan (ENTRESTO) 49-51 MG, Take 0.5 tablets by mouth 2 (two) times daily., Disp: , Rfl:  .  sennosides-docusate sodium (SENOKOT-S) 8.6-50 MG tablet, Take 1 tablet by mouth daily., Disp: , Rfl:  .  sodium chloride (OCEAN) 0.65 % SOLN nasal spray, Place 1 spray into both nostrils as needed for congestion., Disp: , Rfl:  .  spironolactone (ALDACTONE) 25 MG tablet, Take 12.5 mg by mouth daily., Disp: , Rfl:  .  Tiotropium Bromide Monohydrate (SPIRIVA RESPIMAT) 2.5 MCG/ACT AERS, Inhale 1 puff into the lungs daily., Disp: 1 Inhaler, Rfl: 12 .  torsemide (DEMADEX) 20 MG tablet, Take 2 tablets (40 mg total) by mouth daily. (Patient taking differently: Take 40 mg by mouth 2 (two) times daily. ), Disp: 60 tablet, Rfl: 0   COUNSELING POINTS/CLINICAL PEARLS  Carvedilol (Goal: weight less than 85 kg is 25 mg BID, weight greater than 85 kg is 50 mg BID)  Patient should avoid activities requiring coordination until drug effects are realized, as drug  may cause dizziness.  This drug may cause diarrhea, nausea, vomiting, arthralgia, back pain, myalgia, headache, vision disorder, erectile dysfunction, reduced libido, or fatigue.  Instruct patient to report signs/symptoms of adverse cardiovascular effects such as hypotension (especially in elderly patients), arrhythmias, syncope, palpitations, angina, or edema.  Drug may mask symptoms of hypoglycemia. Advise diabetic patients to carefully monitor blood sugar levels.  Patient should take drug with food.  Advise patient against sudden discontinuation of drug. Entresto (Goal: 97/103 mg twice daily)  Warn male patient to avoid pregnancy during therapy and to report a pregnancy to a physician.  Advise patient to report symptomatic hypotension.  Side effects may include hyperkalemia, cough, dizziness, or renal failure. Torsemide  Side effects may include excessive urination.  Tell patient to report symptoms of ototoxicity.  Instruct patient to report lightheadedness or syncope.  Warn patient to avoid use of nonprescription NSAID products without first discussing it with their healthcare provider. Spironolactone  Warn patient to report dehydration, hypotension, or symptoms of worsening renal function.  Counsel male patient to report gynecomastia.  Side effects may include diarrhea, nausea, vomiting, abdominal cramping, fever, leg cramps, lethargy, mental confusion, decreased libido, irregular menses, and rash. Suspension: Tell patient to take drug consistently with respect to food, either before or after a meal.  Advise patient to avoid potassium supplements and foods containing high levels of potassium, including salt substitutes.   DRUGS TO AVOID IN HEART FAILURE  Drug or Class Mechanism  Analgesics . NSAIDs . COX-2 inhibitors . Glucocorticoids  Sodium and water retention, increased systemic vascular resistance, decreased response to diuretics   Diabetes  Medications . Metformin . Thiazolidinediones o Rosiglitazone (Avandia) o Pioglitazone (Actos) . DPP4 Inhibitors o Saxagliptin (Onglyza) o Sitagliptin (Januvia)   Lactic acidosis Possible calcium channel blockade   Unknown  Antiarrhythmics . Class I  o Flecainide o Disopyramide . Class III o Sotalol . Other o Dronedarone  Negative inotrope, proarrhythmic   Proarrhythmic, beta blockade  Negative inotrope  Antihypertensives . Alpha Blockers o Doxazosin . Calcium Channel Blockers o Diltiazem o Verapamil o Nifedipine . Central Alpha Adrenergics o Moxonidine . Peripheral Vasodilators o Minoxidil  Increases  renin and aldosterone  Negative inotrope    Possible sympathetic withdrawal  Unknown  Anti-infective . Itraconazole . Amphotericin B  Negative inotrope Unknown  Hematologic . Anagrelide . Cilostazol   Possible inhibition of PD IV Inhibition of PD III causing arrhythmias  Neurologic/Psychiatric . Stimulants . Anti-Seizure Drugs o Carbamazepine o Pregabalin . Antidepressants o Tricyclics o Citalopram . Parkinsons o Bromocriptine o Pergolide o Pramipexole . Antipsychotics o Clozapine . Antimigraine o Ergotamine o Methysergide . Appetite suppressants . Bipolar o Lithium  Peripheral alpha and beta agonist activity  Negative inotrope and chronotrope Calcium channel blockade  Negative inotrope, proarrhythmic Dose-dependent QT prolongation  Excessive serotonin activity/valvular damage Excessive serotonin activity/valvular damage Unknown  IgE mediated hypersensitivy, calcium channel blockade  Excessive serotonin activity/valvular damage Excessive serotonin activity/valvular damage Valvular damage  Direct myofibrillar degeneration, adrenergic stimulation  Antimalarials . Chloroquine . Hydroxychloroquine Intracellular inhibition of lysosomal enzymes  Urologic Agents . Alpha  Blockers o Doxazosin o Prazosin o Tamsulosin o Terazosin  Increased renin and aldosterone  Adapted from Page RL, et al. "Drugs That May Cause or Exacerbate Heart Failure: A Scientific Statement from the [place]." Circulation 2016; 972:Q20-U01. DOI: 10.1161/CIR.0000000000000426   MEDICATION ADHERENCES TIPS AND STRATEGIES 1. Taking medication as prescribed improves patient outcomes in heart failure (reduces hospitalizations, improves symptoms, increases survival) 2. Side effects of medications can be managed by decreasing doses, switching agents, stopping drugs, or adding additional therapy. Please let someone in the [facility] know if you have having bothersome side effects so we can modify your regimen. Do not alter your medication regimen without talking to [place].  3. Medication reminders can help patients remember to take drugs on time. If you are missing or forgetting doses you can try linking behaviors, using pill boxes, or an electronic reminder like an alarm on your phone or an app. Some people can also get automated phone calls as medication reminders.

## 2019-03-17 ENCOUNTER — Other Ambulatory Visit: Payer: Self-pay

## 2019-03-17 ENCOUNTER — Encounter: Payer: Self-pay | Admitting: Nurse Practitioner

## 2019-03-17 ENCOUNTER — Ambulatory Visit (INDEPENDENT_AMBULATORY_CARE_PROVIDER_SITE_OTHER): Payer: Commercial Managed Care - PPO | Admitting: Nurse Practitioner

## 2019-03-17 VITALS — BP 114/82 | HR 82 | Temp 97.6°F | Ht 72.0 in

## 2019-03-17 DIAGNOSIS — R059 Cough, unspecified: Secondary | ICD-10-CM

## 2019-03-17 DIAGNOSIS — R05 Cough: Secondary | ICD-10-CM | POA: Insufficient documentation

## 2019-03-17 DIAGNOSIS — R197 Diarrhea, unspecified: Secondary | ICD-10-CM | POA: Diagnosis not present

## 2019-03-17 DIAGNOSIS — R0602 Shortness of breath: Secondary | ICD-10-CM | POA: Diagnosis not present

## 2019-03-17 LAB — VERITOR FLU A/B WAIVED
INFLUENZA B: NEGATIVE
Influenza A: NEGATIVE

## 2019-03-17 MED ORDER — AMOXICILLIN-POT CLAVULANATE 875-125 MG PO TABS: 1.0000 | ORAL_TABLET | Freq: Two times a day (BID) | ORAL | 0 refills | Status: AC

## 2019-03-17 MED ORDER — BENZONATATE 200 MG PO CAPS: 200.0000 mg | ORAL_CAPSULE | Freq: Two times a day (BID) | ORAL | 0 refills | Status: DC | PRN

## 2019-03-17 MED ORDER — GUAIFENESIN 100 MG/5ML PO SOLN: 5.0000 mL | ORAL | 0 refills | Status: DC | PRN

## 2019-03-17 NOTE — Progress Notes (Addendum)
BP 114/82    Pulse 82    Temp 97.6 F (36.4 C) (Oral)    Ht 6' (1.829 m)    BMI 30.96 kg/m    Subjective:    Patient ID: Stephen [name], male    DOB: May 27, 1966, 52 y.o.   MRN: [id number]  HPI: Stephen [name] is a 53 y.o. male  Chief Complaint  Patient presents with   Cough   Emesis    pt states thick clear liquid x 3-4 days   Shortness of Breath    EMESIS AND SOB Mr. Medearis had underlying CHF with EF 10%, followed by [facility] Clinic and last seen by NP [facility] 03/12/19.  Has had multiple ER visits since 11/24/18, a total of 9 visits with 6 of these since 12/27/18 being for N&V.  On review of 03/05/2019 ER visit note it is noted that Dr. 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  On 02/25/19 his abdominal U/S noted no ascites, small right pleural effusion.  He presents to office today c/o of SOB, emesis, and cough for 2-3 days.  States he can eat food, but 2-3 minutes after he does not throw up food, but throws up clear, thick liquid.  States his SOB is worse than his baseline at this time.  States he can walk from patient room to up front before notices SOB, prior to 2-3 days ago he reports his SOB was often noted more at night (orthopnea).   Reports cough is productive, clear sputum.  Coughing is irritating and making him throw up.  Currently uses Advair and Spiriva at home for maintenance, has Albuterol inhaler he has been using it 2-3 times a day for the past few days.  Would like potassium checked today, reports he is taking his K+ tablets 20 MEQ four times a day.  Currently taking Torsemide 40 MG daily, prior to a few days ago he was taking this two times a day. Last defib shock was three weeks ago per his report, but none over 2-3 days.  Is checking weight at home and reports no increase in weight of 2 pounds or more overnight or 5 pounds in a week.  Reports weight is staying at  baseline. Onset: 2-3 days ago Number of bowel movements a day : one BM a day, straining to pass it (states a little constipated due to his medications)  Consistency:  Formed stool Gas: every once and awhile Antibiotic use: none Fever: not known, has had chills Nausea: yes Vomiting: yes Weight loss: no Decreased appetite: yes Diarrhea: no Constipation: yes Blood in stool: no Heartburn: no Jaundice: no Rash: no Dysuria/urinary frequency: no Hematuria: no Recurrent NSAID use: no  Cough: yes, has been present but got worse 2-3 days ago Shortness of breath: yes, got worse 2-3 days ago Wheezing: yes Chest pain: yes, with cough Chest tightness: yes Chest congestion: yes Nasal congestion: no Runny nose: yes Post nasal drip: yes Sneezing: no Sore throat: no Swollen glands: no Sinus pressure: no Headache: yes Face pain: no Toothache: no Ear pain: none Ear pressure: none Eyes red/itching:no Eye drainage/crusting: no  Rash: no Fatigue: yes Sick contacts: no Strep contacts: no  Context: fluctuating Recurrent sinusitis: no Relief with OTC cold/cough medications: no  Treatments attempted: none   Relevant past medical, surgical, family and social history reviewed and updated as indicated. Interim medical history since our last visit reviewed.  Allergies and medications reviewed and updated.  Review of Systems  Constitutional: Positive for chills and fatigue. Negative for activity change, diaphoresis and fever.  HENT: Positive for congestion, postnasal drip, rhinorrhea and sinus pressure. Negative for ear discharge, ear pain, sinus pain, sneezing, sore throat and voice change.   Eyes: Negative for pain and redness.  Respiratory: Positive for cough, chest tightness, shortness of breath and wheezing.   Cardiovascular: Negative for chest pain, palpitations and leg swelling.  Gastrointestinal: Positive for diarrhea. Negative for abdominal distention, abdominal pain, constipation,  nausea and vomiting.  Endocrine: Negative for cold intolerance, heat intolerance, polydipsia, polyphagia and polyuria.  Musculoskeletal: Negative.   Skin: Negative.   Neurological: Negative for dizziness, syncope, weakness, light-headedness, numbness and headaches.  Psychiatric/Behavioral: Negative.     Per HPI unless specifically indicated above     Objective:    BP 114/82    Pulse 82    Temp 97.6 F (36.4 C) (Oral)    Ht 6' (1.829 m)    BMI 30.96 kg/m   Wt Readings from Last 3 Encounters:  03/12/19 228 lb 4 oz (103.5 kg)  03/01/19 224 lb 9.6 oz (101.9 kg)  02/16/19 225 lb (102.1 kg)    Physical Exam Vitals signs and nursing note reviewed.  Constitutional:      General: He is awake.     Appearance: He is well-developed. He is ill-appearing.  HENT:     Head: Normocephalic and atraumatic.     Right Ear: Hearing, ear canal and external ear normal. No drainage. A middle ear effusion is present.     Left Ear: Hearing, ear canal and external ear normal. No drainage. A middle ear effusion is present.     Nose: Mucosal edema and rhinorrhea present. Rhinorrhea is clear.     Right Sinus: No maxillary sinus tenderness or frontal sinus tenderness.     Left Sinus: No maxillary sinus tenderness or frontal sinus tenderness.     Mouth/Throat:     Mouth: Mucous membranes are moist.     Pharynx: Uvula midline. Posterior oropharyngeal erythema (mild with cobblestoning) present. No pharyngeal swelling or oropharyngeal exudate.  Eyes:     General: Lids are normal.        Right eye: No discharge.        Left eye: No discharge.     Conjunctiva/sclera: Conjunctivae normal.     Pupils: Pupils are equal, round, and reactive to light.  Neck:     Musculoskeletal: Normal range of motion and neck supple.     Thyroid: No thyromegaly.     Vascular: No carotid bruit or JVD.     Trachea: Trachea normal.  Cardiovascular:     Rate and Rhythm: Normal rate and regular rhythm.     Heart sounds: Normal  heart sounds, S1 normal and S2 normal. No murmur. No gallop.   Pulmonary:     Effort: Pulmonary effort is normal.     Breath sounds: Examination of the right-upper field reveals wheezing. Examination of the left-upper field reveals wheezing. Examination of the right-lower field reveals wheezing. Examination of the left-lower field reveals wheezing. Wheezing present.     Comments: Intermittent expiratory wheezes upper and lower lobes. No rhonchi. Abdominal:     General: Bowel sounds are normal.     Palpations: Abdomen is soft. There is no hepatomegaly or splenomegaly.  Musculoskeletal: Normal range of motion.     Right lower leg: No edema.     Left lower leg: No edema.  Lymphadenopathy:     Head:     Right side of head: No submental, submandibular, tonsillar, preauricular or posterior auricular adenopathy.     Left side of head: No submental, submandibular, tonsillar, preauricular or posterior auricular adenopathy.     Cervical: No cervical adenopathy.  Skin:    General: Skin is warm and dry.     Capillary Refill: Capillary refill takes less than 2 seconds.     Findings: No rash.  Neurological:     Mental Status: He is alert and oriented to person, place, and time.     Deep Tendon Reflexes: Reflexes are normal and symmetric.  Psychiatric:        Attention and Perception: Attention and perception normal.        Mood and Affect: Mood normal.        Speech: Speech normal.        Behavior: Behavior normal. Behavior is cooperative.        Thought Content: Thought content normal.        Judgment: Judgment normal.    O2 sat 97-98% over the whole assessment and conversation with provider  Results for orders placed or performed in visit on 03/17/19  Veritor Flu A/B Waived  Result Value Ref Range   Influenza A Negative Negative   Influenza B Negative Negative      Assessment & Plan:   Problem List Items Addressed This Visit      Other   Cough - Primary    Cough with some increased  SOB and wheezing + nasal drainage in patient with COPD and CHF.  Flu testing negative.  Based on symptoms, COVID 19 testing sent and discussed at length with patient.  He is aware to self quarantine for 4 days until results returned and if positive will have to remain on quarantine for 14 days.  He agreed with this plan and signed.  No respiratory distress at this time.  Scripts for Augmentin, Tessalon, and Guaifenesin sent.  Return to office or go to ER immediately if worsening symptoms (notify you have been tested for COVID 19 if present to ER).        Relevant Orders   Comprehensive metabolic panel   Magnesium   Veritor Flu A/B Waived (Completed)   CBC with Differential/Platelet   Novel Coronavirus, NAA ([facility])      Time: 25 minutes, >50% spent counseling on COVID 19 testing  Follow up plan: Return if symptoms worsen or fail to improve.

## 2019-03-17 NOTE — Assessment & Plan Note (Addendum)
Cough with some increased SOB and wheezing + nasal drainage in patient with COPD and CHF.  Flu testing negative.  Based on symptoms, COVID 19 testing sent and discussed at length with patient.  He is aware to self quarantine for 4 days until results returned and if positive will have to remain on quarantine for 14 days.  He agreed with this plan and signed.  No respiratory distress at this time.  Scripts for Augmentin, Tessalon, and Guaifenesin sent.  Return to office or go to ER immediately if worsening symptoms (notify you have been tested for COVID 19 if present to ER).

## 2019-03-17 NOTE — Patient Instructions (Signed)

## 2019-03-18 ENCOUNTER — Telehealth: Payer: Self-pay | Admitting: Nurse Practitioner

## 2019-03-18 DIAGNOSIS — E875 Hyperkalemia: Secondary | ICD-10-CM

## 2019-03-18 LAB — CBC WITH DIFFERENTIAL/PLATELET
BASOS ABS: 0.1 10*3/uL (ref 0.0–0.2)
BASOS: 1 %
EOS (ABSOLUTE): 0.2 10*3/uL (ref 0.0–0.4)
EOS: 2 %
Hematocrit: 41.8 % (ref 37.5–51.0)
Hemoglobin: 13.8 g/dL (ref 13.0–17.7)
Immature Grans (Abs): 0 10*3/uL (ref 0.0–0.1)
Immature Granulocytes: 0 %
Lymphocytes Absolute: 1.8 10*3/uL (ref 0.7–3.1)
Lymphs: 24 %
MCH: 28.9 pg (ref 26.6–33.0)
MCHC: 33 g/dL (ref 31.5–35.7)
MCV: 87 fL (ref 79–97)
Monocytes Absolute: 0.5 10*3/uL (ref 0.1–0.9)
Monocytes: 7 %
Neutrophils Absolute: 4.8 10*3/uL (ref 1.4–7.0)
Neutrophils: 66 %
Platelets: 252 10*3/uL (ref 150–450)
RBC: 4.78 x10E6/uL (ref 4.14–5.80)
RDW: 15.6 % — ABNORMAL HIGH (ref 11.6–15.4)
WBC: 7.3 10*3/uL (ref 3.4–10.8)

## 2019-03-18 LAB — COMPREHENSIVE METABOLIC PANEL
ALT: 17 IU/L (ref 0–44)
AST: 11 IU/L (ref 0–40)
Albumin/Globulin Ratio: 1.6 (ref 1.2–2.2)
Albumin: 4.3 g/dL (ref 3.8–4.9)
Alkaline Phosphatase: 101 IU/L (ref 39–117)
BUN/Creatinine Ratio: 16 (ref 9–20)
BUN: 32 mg/dL — ABNORMAL HIGH (ref 6–24)
Bilirubin Total: 0.7 mg/dL (ref 0.0–1.2)
CO2: 20 mmol/L (ref 20–29)
Calcium: 10 mg/dL (ref 8.7–10.2)
Chloride: 102 mmol/L (ref 96–106)
Creatinine, Ser: 2.01 mg/dL — ABNORMAL HIGH (ref 0.76–1.27)
GFR, EST AFRICAN AMERICAN: 43 mL/min/{1.73_m2} — AB (ref >59)
GFR, EST NON AFRICAN AMERICAN: 37 mL/min/{1.73_m2} — AB (ref >59)
Globulin, Total: 2.7 g/dL (ref 1.5–4.5)
Glucose: 155 mg/dL — ABNORMAL HIGH (ref 65–99)
Potassium: 5.9 mmol/L — ABNORMAL HIGH (ref 3.5–5.2)
Sodium: 140 mmol/L (ref 134–144)
Total Protein: 7 g/dL (ref 6.0–8.5)

## 2019-03-18 LAB — MAGNESIUM: Magnesium: 2.4 mg/dL — ABNORMAL HIGH (ref 1.6–2.3)

## 2019-03-18 NOTE — Telephone Encounter (Signed)
Spoke to [name] via telephone about lab results.  K+ 5.9, patient has been taking his 20 MEQ potassium four times a day due to concerns for low potassium which he has had frequently in past.  This is ordered twice a day.  Recommended he return to this dosage and not take more than ordered unless instructed by his providers.  His magnesium level was slightly elevated at 2.4, he is currently getting magnesium replacement on his last two days.  Have recommended he hold his magnesium at this time.  He is to come in on Friday for repeat K+ and Mag levels.  He was able to verbalize this plan back to provider.  Reports feeling "some" better today.  Denies CP, SOB, palpitations.

## 2019-03-20 ENCOUNTER — Other Ambulatory Visit: Payer: Commercial Managed Care - PPO

## 2019-03-20 ENCOUNTER — Other Ambulatory Visit: Payer: Self-pay

## 2019-03-20 ENCOUNTER — Telehealth: Payer: Self-pay | Admitting: Nurse Practitioner

## 2019-03-20 DIAGNOSIS — E875 Hyperkalemia: Secondary | ICD-10-CM

## 2019-03-20 NOTE — Telephone Encounter (Signed)
Noted.  He did come in for lab recheck today.

## 2019-03-20 NOTE — Telephone Encounter (Signed)
Copied from [place] [id number]. Topic: General - Other >> Mar 20, 2019  3:56 PM Reyne Dumas L wrote: Reason for CRM:   Pt's sister, Cam, calling.  States that pt was tested for COVID earlier in the week but has gone out to the doctors office, to possibly do laundry, etc.  Pt's sister was made aware that pt is to be quarantined from time of testing until he hears back from the doctors office.  This means no one in or out.  Pt's sister is going to get in contact with pt to find out where he has been since testing.  Need locations, dates, and times and call back.

## 2019-03-21 LAB — MAGNESIUM: Magnesium: 2.1 mg/dL (ref 1.6–2.3)

## 2019-03-21 LAB — POTASSIUM: Potassium: 4 mmol/L (ref 3.5–5.2)

## 2019-03-23 ENCOUNTER — Telehealth: Payer: Self-pay | Admitting: Family Medicine

## 2019-03-23 NOTE — Telephone Encounter (Signed)
Message relayed to patient. Verbalized understanding and denied questions.   

## 2019-03-23 NOTE — Telephone Encounter (Signed)
Please let her know that we are aware that it's been more than 3-5 days. The results are still not back. We will let them know as soon as the results come back.

## 2019-03-23 NOTE — Telephone Encounter (Signed)
Copied from [place] [id number]. Topic: Quick Communication - See Telephone Encounter >> Mar 23, 2019 12:09 PM Vernona Rieger wrote: CRM for notification. See Telephone encounter for: 03/23/19. Patient's sister said he had a COVID-19 test on 3/17 -  I advised her that the results were not back yet. She said she was told 3-5 days and it has been 7. She would like the nurse to call her. [phone]

## 2019-03-24 ENCOUNTER — Telehealth (HOSPITAL_COMMUNITY): Payer: Self-pay

## 2019-03-24 NOTE — Telephone Encounter (Signed)
Daine is still waiting on results from his coronavirus test to come back.  He states he has no swelling, trying to stay active inside, did advise him he can go outside walk around.  He thought he had to stay inside.  He states been eating good, watching fluids.  He has all his meds.  He is aware of what to take and how.  He states he has been having some diarrhea, but not real bad.  Looked at his meds, he is taking augmentin, advised him that is most likely what is causing it, will subside when he finishes it.  Advised him to eat when takes them, eat dry foods. He denies cough or fever.  He states feels pretty good. He does not have a scale at this time that works to weigh, will furnish him a scale.  Will continue to check his chart for results and will do a home visit afterwards.   [place] [phone]

## 2019-03-24 NOTE — Telephone Encounter (Signed)
Patient's sister, Cam, calling to inquire about COVID 19 results. Advised that the results were not back yet and that someone would be in contact with the patient once those results have been resulted.

## 2019-03-25 NOTE — Telephone Encounter (Signed)
Called [facility], results still pending, will call when we get them.

## 2019-03-26 LAB — NOVEL CORONAVIRUS, NAA: SARS-CoV-2, NAA: NOT DETECTED

## 2019-03-26 NOTE — Telephone Encounter (Signed)
COVID - 19 results are back. Please review so we may contact patient.

## 2019-03-26 NOTE — Telephone Encounter (Signed)
Patient's sister and patient calling to get results of COVID19 test.   Took the test on 03/17/19.  Patient's sister said she did see something on My Chart but needs some clarification on what it means.  Please call back at [phone] or [phone].  If no one answers, patient stated the numbers are secure to leave a message.

## 2019-03-26 NOTE — Telephone Encounter (Signed)
I think you saw him for this

## 2019-03-26 NOTE — Telephone Encounter (Signed)
I just called sister to let her know more about results and did advise that even though his were negative, that I would still recommend minimal to no time out in community at this time due to his cardiac health.  His sister was very pleasant and reports she has been telling him this as well.

## 2019-03-26 NOTE — Telephone Encounter (Signed)
VM left requesting pt give [place] a call back or check his mychart for lab results.

## 2019-03-26 NOTE — Telephone Encounter (Signed)
Called and relayed info to patient's sister. She was aware of results.

## 2019-03-26 NOTE — Telephone Encounter (Signed)
I contacted him via MyChart, but you can call too to ensure he received.

## 2019-03-26 NOTE — Telephone Encounter (Signed)
I sent a MyChart message to him today and am having girls call to ensure he got it.  It just returned this morning.

## 2019-03-31 ENCOUNTER — Telehealth (HOSPITAL_COMMUNITY): Payer: Self-pay

## 2019-03-31 NOTE — Telephone Encounter (Signed)
Received a phone call from Arun today advising he is negative for coronavirus.  He is feeling better just have a cough with phlegm.  He has a visit with his PCP this week for follow up.  He states has all his meds and he knows how to take them.  He wanted to know what to take for cough, he has pearls that he says does nothing.  He states mucinex messes with his potassium levels because he drinks too much with it.  Recommended Creomulsion which should not effect his medications or heart.  He states will try it.  He states he is feeling better, just ready to feel great and get to cooking like he was.  He sounds good, states has some shortness of breath with activity, goes away at rest.  Denies chest pain except when coughs.  Will follow up with him either at home or by telephone after his visit with PCP.   [place] [phone]

## 2019-04-02 ENCOUNTER — Ambulatory Visit (INDEPENDENT_AMBULATORY_CARE_PROVIDER_SITE_OTHER): Payer: PRIVATE HEALTH INSURANCE | Admitting: Family Medicine

## 2019-04-02 ENCOUNTER — Other Ambulatory Visit: Payer: Self-pay

## 2019-04-02 ENCOUNTER — Encounter: Payer: Self-pay | Admitting: Family Medicine

## 2019-04-02 VITALS — BP 112/94 | HR 82 | Ht 73.5 in | Wt 226.0 lb

## 2019-04-02 DIAGNOSIS — R05 Cough: Secondary | ICD-10-CM

## 2019-04-02 DIAGNOSIS — R059 Cough, unspecified: Secondary | ICD-10-CM

## 2019-04-02 DIAGNOSIS — R112 Nausea with vomiting, unspecified: Secondary | ICD-10-CM | POA: Diagnosis not present

## 2019-04-02 MED ORDER — DOXYCYCLINE HYCLATE 100 MG PO TABS: 100.0000 mg | ORAL_TABLET | Freq: Two times a day (BID) | ORAL | 0 refills | Status: DC

## 2019-04-02 MED ORDER — PREDNISONE 20 MG PO TABS: 20.0000 mg | ORAL_TABLET | Freq: Every day | ORAL | 0 refills | Status: DC

## 2019-04-02 NOTE — Progress Notes (Addendum)
BP (!) 112/94 (BP Location: Left Arm, Patient Position: Sitting, Cuff Size: Normal)   Pulse 82   Ht 6' 1.5" (1.867 m)   Wt 226 lb (102.5 kg)   BMI 29.41 kg/m    Subjective:    Patient ID: Stephen [name], male    DOB: 1966-04-27, 53 y.o.   MRN: [id number]  HPI: Stephen [name] is a 53 y.o. male  Chief Complaint  Patient presents with  . Hyperkalemia    3 month follow-up  . Hypermagnesia  . Cough    Patient states he still has slight cough and a little shortness of breath.   Feeling like his cold symptoms have gotten better. Still having a bit of a cough and still a little shortness of breath. The cough medicine seemed like it didn't do anything. No swelling in his legs unless he's on his feet for a long time. Still feels like he's a little congested in his nose. Still very congested in his chest. Able to cough up some clear sputum. Has been having pain in his R back radiating around to his R front along his ribs. States that it feels tight and sore there. He denies any fevers.  He has been drinking CORE water which is alkaline and has extra electrolytes in it. He states that it is making him feel better. Electrolytes were normal last visit.   Still feeling nauseous- throwing up clear vomitus. Pepto has been helping. Nothing else does. There was some concern for cirrhosis on his CT in the hospital, but his labs have been normal and he doesn't drink. Has not seen GI.   He continues to follow with cardiology. Going to have a conversation with [name] coming up about heart transplant. He is otherwise feeling pretty good. No other concerns or complaints at this time.    Relevant past medical, surgical, family and social history reviewed and updated as indicated. Interim medical history since our last visit reviewed. Allergies and medications reviewed and updated.  Review of Systems  Respiratory: Positive for cough and shortness of breath. Negative for apnea, choking, chest  tightness, wheezing and stridor.   Cardiovascular: Positive for chest pain (on the R side in the back and into the front- feels like a knot). Negative for palpitations and leg swelling.    Per HPI unless specifically indicated above     Objective:    BP (!) 112/94 (BP Location: Left Arm, Patient Position: Sitting, Cuff Size: Normal)   Pulse 82   Ht 6' 1.5" (1.867 m)   Wt 226 lb (102.5 kg)   BMI 29.41 kg/m   Wt Readings from Last 3 Encounters:  04/02/19 226 lb (102.5 kg)  03/12/19 228 lb 4 oz (103.5 kg)  03/01/19 224 lb 9.6 oz (101.9 kg)    Physical Exam Vitals signs and nursing note reviewed.  Constitutional:      General: He is not in acute distress.    Appearance: Normal appearance. He is well-developed and well-nourished. He is not ill-appearing, toxic-appearing or diaphoretic.  HENT:     Head: Normocephalic and atraumatic.     Right Ear: External ear normal.     Left Ear: External ear normal.     Nose: Nose normal.     Mouth/Throat:     Mouth: Mucous membranes are moist.     Pharynx: Oropharynx is clear.  Eyes:     General: No scleral icterus.       Right eye: No discharge.  Left eye: No discharge.     Conjunctiva/sclera: Conjunctivae normal.     Pupils: Pupils are equal, round, and reactive to light.  Neck:     Musculoskeletal: Normal range of motion.  Pulmonary:     Effort: Pulmonary effort is normal. No respiratory distress.     Comments: Speaking in full sentences Abdominal:     Tenderness: There is no abdominal tenderness (on self-palpation, visually verified).  Musculoskeletal: Normal range of motion.  Skin:    Coloration: Skin is not jaundiced or pale.     Findings: No bruising, erythema, lesion or rash.  Neurological:     Mental Status: He is alert and oriented to person, place, and time. Mental status is at baseline.  Psychiatric:        Mood and Affect: Mood normal.        Behavior: Behavior normal.        Thought Content: Thought content  normal.        Judgment: Judgment normal.       Results for orders placed or performed in visit on 03/20/19  Potassium  Result Value Ref Range   Potassium 4.0 3.5 - 5.2 mmol/L  Magnesium  Result Value Ref Range   Magnesium 2.1 1.6 - 2.3 mg/dL      Assessment & Plan:   Problem List Items Addressed This Visit      Other   Cough - Primary    Given recent illness and R sided pain, will treat for pneumonia with doxycycline and 20mg  prednisone burst for 7 days. If getting worse- go to ER. Recheck 1 wk.       Other Visit Diagnoses    Non-intractable vomiting with nausea, unspecified vomiting type       Not getting better. Of unclear etiology. Cannot have EGD due to cardiac function. Will get him into GI for evaluation. Referral generated today.   Relevant Orders   Ambulatory referral to Gastroenterology       Follow up plan: Return in about 1 week (around 04/09/2019).    . This visit was completed via Skype due to the restrictions of the COVID-19 pandemic. All issues as above were discussed and addressed. Physical exam was done as above through visual confirmation on Skype. If it was felt that the patient should be evaluated in the office, they were directed there. The patient verbally consented to this visit. . Location of the patient: home . Location of the provider: home . Those involved with this call:  . Provider: Park Liter, DO . CMA: Merilyn Baba, CMA . Front Desk/Registration: Don Perking  . Time spent on call: 25 minutes with patient face to face via video conference. More than 50% of this time was spent in counseling and coordination of care.

## 2019-04-02 NOTE — Assessment & Plan Note (Signed)
Given recent illness and R sided pain, will treat for pneumonia with doxycycline and 20mg  prednisone burst for 7 days. If getting worse- go to ER. Recheck 1 wk.

## 2019-04-07 ENCOUNTER — Inpatient Hospital Stay
Admission: EM | Admit: 2019-04-07 | Discharge: 2019-04-15 | DRG: 291 | Disposition: A | Discharge status: Home / Self Care | Payer: PRIVATE HEALTH INSURANCE | Attending: Internal Medicine | Admitting: Internal Medicine

## 2019-04-07 ENCOUNTER — Emergency Department: Payer: PRIVATE HEALTH INSURANCE

## 2019-04-07 ENCOUNTER — Encounter (HOSPITAL_COMMUNITY): Payer: Self-pay

## 2019-04-07 ENCOUNTER — Other Ambulatory Visit: Payer: Self-pay

## 2019-04-07 ENCOUNTER — Ambulatory Visit: Payer: Self-pay | Admitting: *Deleted

## 2019-04-07 ENCOUNTER — Other Ambulatory Visit (HOSPITAL_COMMUNITY): Payer: Self-pay

## 2019-04-07 DIAGNOSIS — E785 Hyperlipidemia, unspecified: Secondary | ICD-10-CM | POA: Diagnosis present

## 2019-04-07 DIAGNOSIS — M4690 Unspecified inflammatory spondylopathy, site unspecified: Secondary | ICD-10-CM | POA: Diagnosis present

## 2019-04-07 DIAGNOSIS — I472 Ventricular tachycardia: Secondary | ICD-10-CM | POA: Diagnosis present

## 2019-04-07 DIAGNOSIS — Z9852 Vasectomy status: Secondary | ICD-10-CM | POA: Diagnosis not present

## 2019-04-07 DIAGNOSIS — Z7982 Long term (current) use of aspirin: Secondary | ICD-10-CM

## 2019-04-07 DIAGNOSIS — Z95810 Presence of automatic (implantable) cardiac defibrillator: Secondary | ICD-10-CM

## 2019-04-07 DIAGNOSIS — Z590 Homelessness: Secondary | ICD-10-CM | POA: Diagnosis not present

## 2019-04-07 DIAGNOSIS — Z7902 Long term (current) use of antithrombotics/antiplatelets: Secondary | ICD-10-CM

## 2019-04-07 DIAGNOSIS — I440 Atrioventricular block, first degree: Secondary | ICD-10-CM | POA: Diagnosis not present

## 2019-04-07 DIAGNOSIS — R0902 Hypoxemia: Secondary | ICD-10-CM

## 2019-04-07 DIAGNOSIS — Z8701 Personal history of pneumonia (recurrent): Secondary | ICD-10-CM

## 2019-04-07 DIAGNOSIS — M109 Gout, unspecified: Secondary | ICD-10-CM | POA: Diagnosis present

## 2019-04-07 DIAGNOSIS — Z8249 Family history of ischemic heart disease and other diseases of the circulatory system: Secondary | ICD-10-CM

## 2019-04-07 DIAGNOSIS — M170 Bilateral primary osteoarthritis of knee: Secondary | ICD-10-CM | POA: Diagnosis present

## 2019-04-07 DIAGNOSIS — I501 Left ventricular failure: Secondary | ICD-10-CM | POA: Diagnosis present

## 2019-04-07 DIAGNOSIS — R000 Tachycardia, unspecified: Secondary | ICD-10-CM | POA: Diagnosis not present

## 2019-04-07 DIAGNOSIS — E872 Acidosis: Secondary | ICD-10-CM | POA: Diagnosis present

## 2019-04-07 DIAGNOSIS — I509 Heart failure, unspecified: Secondary | ICD-10-CM

## 2019-04-07 DIAGNOSIS — Z7951 Long term (current) use of inhaled steroids: Secondary | ICD-10-CM | POA: Diagnosis not present

## 2019-04-07 DIAGNOSIS — Z87891 Personal history of nicotine dependence: Secondary | ICD-10-CM

## 2019-04-07 DIAGNOSIS — J449 Chronic obstructive pulmonary disease, unspecified: Secondary | ICD-10-CM | POA: Diagnosis present

## 2019-04-07 DIAGNOSIS — I5023 Acute on chronic systolic (congestive) heart failure: Secondary | ICD-10-CM | POA: Diagnosis present

## 2019-04-07 DIAGNOSIS — Z7952 Long term (current) use of systemic steroids: Secondary | ICD-10-CM | POA: Diagnosis not present

## 2019-04-07 DIAGNOSIS — E0590 Thyrotoxicosis, unspecified without thyrotoxic crisis or storm: Secondary | ICD-10-CM | POA: Diagnosis present

## 2019-04-07 DIAGNOSIS — I493 Ventricular premature depolarization: Secondary | ICD-10-CM | POA: Diagnosis present

## 2019-04-07 DIAGNOSIS — Z79899 Other long term (current) drug therapy: Secondary | ICD-10-CM | POA: Diagnosis not present

## 2019-04-07 DIAGNOSIS — N179 Acute kidney failure, unspecified: Secondary | ICD-10-CM | POA: Diagnosis present

## 2019-04-07 DIAGNOSIS — N183 Chronic kidney disease, stage 3 (moderate): Secondary | ICD-10-CM | POA: Diagnosis present

## 2019-04-07 DIAGNOSIS — Z87442 Personal history of urinary calculi: Secondary | ICD-10-CM

## 2019-04-07 DIAGNOSIS — I420 Dilated cardiomyopathy: Secondary | ICD-10-CM | POA: Diagnosis present

## 2019-04-07 DIAGNOSIS — Z8674 Personal history of sudden cardiac arrest: Secondary | ICD-10-CM

## 2019-04-07 DIAGNOSIS — I130 Hypertensive heart and chronic kidney disease with heart failure and stage 1 through stage 4 chronic kidney disease, or unspecified chronic kidney disease: Secondary | ICD-10-CM | POA: Diagnosis present

## 2019-04-07 DIAGNOSIS — Z8673 Personal history of transient ischemic attack (TIA), and cerebral infarction without residual deficits: Secondary | ICD-10-CM

## 2019-04-07 DIAGNOSIS — I959 Hypotension, unspecified: Secondary | ICD-10-CM | POA: Diagnosis not present

## 2019-04-07 LAB — COMPREHENSIVE METABOLIC PANEL
ALT: 15 U/L (ref 0–44)
AST: 16 U/L (ref 15–41)
Albumin: 3.9 g/dL (ref 3.5–5.0)
Alkaline Phosphatase: 88 U/L (ref 38–126)
Anion gap: 13 (ref 5–15)
BUN: 28 mg/dL — ABNORMAL HIGH (ref 6–20)
CO2: 19 mmol/L — ABNORMAL LOW (ref 22–32)
Calcium: 9.7 mg/dL (ref 8.9–10.3)
Chloride: 107 mmol/L (ref 98–111)
Creatinine, Ser: 2.1 mg/dL — ABNORMAL HIGH (ref 0.61–1.24)
GFR calc Af Amer: 40 mL/min — ABNORMAL LOW (ref >60)
GFR calc non Af Amer: 35 mL/min — ABNORMAL LOW (ref >60)
Glucose, Bld: 169 mg/dL — ABNORMAL HIGH (ref 70–99)
Potassium: 4.8 mmol/L (ref 3.5–5.1)
Sodium: 139 mmol/L (ref 135–145)
Total Bilirubin: 1.3 mg/dL — ABNORMAL HIGH (ref 0.3–1.2)
Total Protein: 7.1 g/dL (ref 6.5–8.1)

## 2019-04-07 LAB — CBC WITH DIFFERENTIAL/PLATELET
Abs Immature Granulocytes: 0.02 10*3/uL (ref 0.00–0.07)
Basophils Absolute: 0.1 10*3/uL (ref 0.0–0.1)
Basophils Relative: 1 %
Eosinophils Absolute: 0 10*3/uL (ref 0.0–0.5)
Eosinophils Relative: 0 %
HCT: 43.1 % (ref 39.0–52.0)
Hemoglobin: 13.9 g/dL (ref 13.0–17.0)
Immature Granulocytes: 0 %
Lymphocytes Relative: 12 %
Lymphs Abs: 1.2 10*3/uL (ref 0.7–4.0)
MCH: 28.8 pg (ref 26.0–34.0)
MCHC: 32.3 g/dL (ref 30.0–36.0)
MCV: 89.2 fL (ref 80.0–100.0)
Monocytes Absolute: 0.8 10*3/uL (ref 0.1–1.0)
Monocytes Relative: 8 %
Neutro Abs: 7.7 10*3/uL (ref 1.7–7.7)
Neutrophils Relative %: 79 %
Platelets: 301 10*3/uL (ref 150–400)
RBC: 4.83 MIL/uL (ref 4.22–5.81)
RDW: 18.1 % — ABNORMAL HIGH (ref 11.5–15.5)
WBC: 9.8 10*3/uL (ref 4.0–10.5)
nRBC: 0 % (ref 0.0–0.2)

## 2019-04-07 LAB — BRAIN NATRIURETIC PEPTIDE: B Natriuretic Peptide: >4500 pg/mL — ABNORMAL HIGH (ref 0.0–100.0)

## 2019-04-07 LAB — TROPONIN I: Troponin I: 0.03 ng/mL (ref <0.03)

## 2019-04-07 MED ORDER — METHIMAZOLE 10 MG PO TABS
40.0000 mg | ORAL_TABLET | Freq: Every day | ORAL | Status: DC
  Administered 2019-04-08 – 2019-04-15 (×8): 40 mg via ORAL
  Filled 2019-04-07 (×9): qty 4

## 2019-04-07 MED ORDER — BENZONATATE 100 MG PO CAPS
200.0000 mg | ORAL_CAPSULE | Freq: Two times a day (BID) | ORAL | Status: DC | PRN
  Administered 2019-04-07: 200 mg via ORAL
  Filled 2019-04-07: qty 2

## 2019-04-07 MED ORDER — ONDANSETRON HCL 4 MG PO TABS
4.0000 mg | ORAL_TABLET | Freq: Four times a day (QID) | ORAL | Status: DC | PRN
  Administered 2019-04-14: 4 mg via ORAL

## 2019-04-07 MED ORDER — SENNOSIDES-DOCUSATE SODIUM 8.6-50 MG PO TABS
1.0000 | ORAL_TABLET | Freq: Every day | ORAL | Status: DC
  Administered 2019-04-13: 1 via ORAL
  Filled 2019-04-07 (×6): qty 1

## 2019-04-07 MED ORDER — FUROSEMIDE 10 MG/ML IJ SOLN
6.0000 mg/h | INTRAVENOUS | Status: AC
  Administered 2019-04-07: 8 mg/h via INTRAVENOUS
  Administered 2019-04-08 (×2): 6 mg/h via INTRAVENOUS
  Filled 2019-04-07: qty 25

## 2019-04-07 MED ORDER — NITROGLYCERIN 0.4 MG SL SUBL: 0.4000 mg | SUBLINGUAL_TABLET | SUBLINGUAL | Status: DC | PRN

## 2019-04-07 MED ORDER — SACUBITRIL-VALSARTAN 49-51 MG PO TABS: 0.5000 | ORAL_TABLET | Freq: Two times a day (BID) | ORAL | Status: DC

## 2019-04-07 MED ORDER — CARVEDILOL 3.125 MG PO TABS: 3.1250 mg | ORAL_TABLET | Freq: Two times a day (BID) | ORAL | Status: DC

## 2019-04-07 MED ORDER — ONDANSETRON HCL 4 MG/2ML IJ SOLN
4.0000 mg | Freq: Four times a day (QID) | INTRAMUSCULAR | Status: DC | PRN
  Administered 2019-04-08 – 2019-04-11 (×2): 4 mg via INTRAVENOUS
  Filled 2019-04-07 (×3): qty 2

## 2019-04-07 MED ORDER — MOMETASONE FURO-FORMOTEROL FUM 200-5 MCG/ACT IN AERO
2.0000 | INHALATION_SPRAY | Freq: Two times a day (BID) | RESPIRATORY_TRACT | Status: DC
  Administered 2019-04-07 – 2019-04-15 (×16): 2 via RESPIRATORY_TRACT
  Filled 2019-04-07: qty 8.8

## 2019-04-07 MED ORDER — ASPIRIN EC 81 MG PO TBEC
81.0000 mg | DELAYED_RELEASE_TABLET | Freq: Every day | ORAL | Status: DC
  Administered 2019-04-08 – 2019-04-15 (×8): 81 mg via ORAL
  Filled 2019-04-07 (×8): qty 1

## 2019-04-07 MED ORDER — SODIUM CHLORIDE 0.9% FLUSH
3.0000 mL | Freq: Two times a day (BID) | INTRAVENOUS | Status: DC
  Administered 2019-04-07 – 2019-04-15 (×15): 3 mL via INTRAVENOUS

## 2019-04-07 MED ORDER — CYCLOBENZAPRINE HCL 10 MG PO TABS: 5.0000 mg | ORAL_TABLET | Freq: Three times a day (TID) | ORAL | Status: DC | PRN

## 2019-04-07 MED ORDER — PREDNISONE 20 MG PO TABS
20.0000 mg | ORAL_TABLET | Freq: Every day | ORAL | Status: DC
  Administered 2019-04-08 – 2019-04-15 (×8): 20 mg via ORAL
  Filled 2019-04-07 (×8): qty 1

## 2019-04-07 MED ORDER — CLOPIDOGREL BISULFATE 75 MG PO TABS
75.0000 mg | ORAL_TABLET | Freq: Every day | ORAL | Status: DC
  Administered 2019-04-08 – 2019-04-15 (×8): 75 mg via ORAL
  Filled 2019-04-07 (×8): qty 1

## 2019-04-07 MED ORDER — ATORVASTATIN CALCIUM 20 MG PO TABS
80.0000 mg | ORAL_TABLET | Freq: Every day | ORAL | Status: DC
  Administered 2019-04-07 – 2019-04-15 (×8): 80 mg via ORAL
  Filled 2019-04-07 (×9): qty 4

## 2019-04-07 MED ORDER — AMIODARONE HCL 200 MG PO TABS
400.0000 mg | ORAL_TABLET | Freq: Every day | ORAL | Status: DC
  Administered 2019-04-08 – 2019-04-15 (×8): 400 mg via ORAL
  Filled 2019-04-07 (×8): qty 2

## 2019-04-07 MED ORDER — POLYETHYLENE GLYCOL 3350 17 G PO PACK: 17.0000 g | PACK | Freq: Every day | ORAL | Status: DC | PRN

## 2019-04-07 MED ORDER — FUROSEMIDE 10 MG/ML IJ SOLN
40.0000 mg | Freq: Once | INTRAMUSCULAR | Status: AC
  Administered 2019-04-07: 40 mg via INTRAVENOUS
  Filled 2019-04-07: qty 4

## 2019-04-07 MED ORDER — TIOTROPIUM BROMIDE MONOHYDRATE 18 MCG IN CAPS
1.0000 | ORAL_CAPSULE | Freq: Every day | RESPIRATORY_TRACT | Status: DC
  Administered 2019-04-08 – 2019-04-15 (×7): 18 ug via RESPIRATORY_TRACT
  Filled 2019-04-07 (×2): qty 5

## 2019-04-07 MED ORDER — ACETAMINOPHEN 325 MG PO TABS
650.0000 mg | ORAL_TABLET | Freq: Four times a day (QID) | ORAL | Status: DC | PRN
  Administered 2019-04-09: 650 mg via ORAL
  Filled 2019-04-07: qty 2

## 2019-04-07 MED ORDER — ENOXAPARIN SODIUM 40 MG/0.4ML ~~LOC~~ SOLN
40.0000 mg | SUBCUTANEOUS | Status: DC
  Administered 2019-04-07 – 2019-04-14 (×8): 40 mg via SUBCUTANEOUS
  Filled 2019-04-07 (×8): qty 0.4

## 2019-04-07 MED ORDER — ACETAMINOPHEN 650 MG RE SUPP: 650.0000 mg | Freq: Four times a day (QID) | RECTAL | Status: DC | PRN

## 2019-04-07 MED ORDER — PANTOPRAZOLE SODIUM 40 MG PO TBEC
40.0000 mg | DELAYED_RELEASE_TABLET | Freq: Two times a day (BID) | ORAL | Status: DC
  Administered 2019-04-07 – 2019-04-15 (×15): 40 mg via ORAL
  Filled 2019-04-07 (×15): qty 1

## 2019-04-07 MED ORDER — FUROSEMIDE 10 MG/ML IJ SOLN
8.0000 mg/h | INTRAVENOUS | Status: DC
  Filled 2019-04-07: qty 25

## 2019-04-07 MED ORDER — ALBUTEROL SULFATE (2.5 MG/3ML) 0.083% IN NEBU: 2.5000 mg | INHALATION_SOLUTION | RESPIRATORY_TRACT | Status: DC | PRN

## 2019-04-07 MED ORDER — GUAIFENESIN 100 MG/5ML PO SOLN
5.0000 mL | ORAL | Status: DC | PRN
  Administered 2019-04-07 – 2019-04-14 (×8): 100 mg via ORAL
  Filled 2019-04-07 (×10): qty 5

## 2019-04-07 NOTE — Progress Notes (Signed)
Advance care planning  Purpose of Encounter Congestive heart failure  Parties in Attendance Patient  Patients Decisional capacity Alert and oriented.  Able to make medical decisions.  Patient tells me his documented healthcare power of attorney is his sister Serina Cowper. Does not have ACP documents in place.  Discussed regarding patient's acute on chronic systolic congestive heart failure with low ejection fraction of 10%.  He understands he is at risk for complications.  Discussed regarding treatment plan, prognosis.  All questions answered  Patient has been partial code in the past.  We discussed regarding his CODE STATUS and he tells me that he would like to be intubated if needed and CPR if necessary.  Has AICD in place.  Wishes to be full code.  Orders entered and CODE STATUS changed.  Time spent-17 minutes

## 2019-04-07 NOTE — ED Notes (Signed)
Admitting at bedside 

## 2019-04-07 NOTE — Progress Notes (Signed)
Anticoagulation monitoring(Lovenox):  52yo  male ordered Lovenox 30 mg Q24h  Filed Weights   04/07/19 1314  Weight: 223 lb (101.2 kg)   BMI 29.4   Lab Results  Component Value Date   CREATININE 2.10 (H) 04/07/2019   CREATININE 2.01 (H) 03/17/2019   CREATININE 1.91 (H) 03/01/2019   Estimated Creatinine Clearance: 50.9 mL/min (A) (by C-G formula based on SCr of 2.1 mg/dL (H)). Hemoglobin & Hematocrit     Component Value Date/Time   HGB 13.9 04/07/2019 1327   HGB 13.8 03/17/2019 0952   HCT 43.1 04/07/2019 1327   HCT 41.8 03/17/2019 0952     Per Protocol for Patient with estCrcl < 30 ml/min and BMI < 40, will transition to Lovenox 40 mg Q24h.

## 2019-04-07 NOTE — ED Notes (Signed)
ED TO INPATIENT HANDOFF REPORT  ED Nurse Name and Phone #: Janett Billow [phone]  J Name/Age/Gender Stephen [name] 53 y.o. male Room/Bed: ED05A/ED05A  Code Status   Code Status: Full Code  Home/SNF/Other Home Patient oriented to: self, place, time and situation Is this baseline? Yes   Triage Complete: Triage complete  Chief Complaint SOB  Triage Note Pt comes from home via EMS after c/o SOB. Pt tested neg for covid 4 weeks ago. Has been seeing doctor regularly to figure out what is going on. Pt has taken antibiotic and prednisone round with no relief. Pt has tried albuterol and no relief. Pt has CHF and is on heart transplant list. Pt EF is 10-15% normally. Pt also vomitted all night a clear frothy vomit. Pt has phenergan at home with no relief.    Allergies Allergies  Allergen Reactions  . Lisinopril Cough    Level of Care/Admitting Diagnosis ED Disposition    ED Disposition Condition [facility]: [facility] [[id number]]  Level of Care: Telemetry [5]  Diagnosis: CHF (congestive heart failure) [facility]) [[id number]]  Admitting Physician: Hillary Bow [[id number]]  Attending Physician: Hillary Bow [[id number]]  Estimated length of stay: past midnight tomorrow  Certification:: I certify this patient will need inpatient services for at least 2 midnights  PT Class (Do Not Modify): Inpatient [101]  PT Acc Code (Do Not Modify): Private [1]       B Medical/Surgery History Past Medical History:  Diagnosis Date  . AICD (automatic cardioverter/defibrillator) present   . AKI (acute kidney injury) ([place]) 12/24/2017  . Arrhythmia   . Arthritis    "lower back, knees" (06/10/2018)  . Cardiac arrest ([place]) 12/23/2017   Brief V-fib arrest  . Chest pain 09/08/2017  . CHF (congestive heart failure) (HCC)    nonischemic cardiomyopathy, EF 25%  . Gout    "on daily RX" (06/10/2018)  . Headache    "q couple months" (06/10/2018)  . High cholesterol   . History  of kidney stones   . Hypertension   . Influenza A 12/24/2017  . NICM (nonischemic cardiomyopathy) ([place])   . Pneumonia 12/20/2017  . TIA (transient ischemic attack) 06/21/2016   "still affects my memory a little bit" (06/10/2018)   Past Surgical History:  Procedure Laterality Date  . EXTERNAL FIXATION LEG Right ~ 2000   "was going bowlegged; had to brake my leg to fix it"  . HERNIA REPAIR    . ICD IMPLANT N/A 12/30/2017   Procedure: ICD IMPLANT;  Surgeon: Deboraha Sprang, MD;  Location: [facility];  Service: Cardiovascular;  Laterality: N/A;  . LEFT HEART CATH AND CORONARY ANGIOGRAPHY N/A 09/09/2017   Procedure: LEFT HEART CATH AND CORONARY ANGIOGRAPHY;  Surgeon: Teodoro Spray, MD;  Location: [facility];  Service: Cardiovascular;  Laterality: N/A;  . UMBILICAL HERNIA REPAIR  1990s  . V TACH ABLATION  06/10/2018  . V TACH ABLATION N/A 06/10/2018   Procedure: V TACH ABLATION;  Surgeon: Thompson Grayer, MD;  Location: [facility];  Service: Cardiovascular;  Laterality: N/A;  . VASECTOMY       A IV Location/Drains/Wounds Patient Lines/Drains/Airways Status   Active Line/Drains/Airways    Name:   Placement date:   Placement time:   Site:   Days:   Peripheral IV 04/07/19 Right Forearm   04/07/19    1356    Forearm   less than 1  Intake/Output Last 24 hours No intake or output data in the 24 hours ending 04/07/19 1451  Labs/Imaging Results for orders placed or performed during the hospital encounter of 04/07/19 (from the past 48 hour(s))  CBC with Differential     Status: Abnormal   Collection Time: 04/07/19  1:27 PM  Result Value Ref Range   WBC 9.8 4.0 - 10.5 K/uL   RBC 4.83 4.22 - 5.81 MIL/uL   Hemoglobin 13.9 13.0 - 17.0 g/dL   HCT 43.1 39.0 - 52.0 %   MCV 89.2 80.0 - 100.0 fL   MCH 28.8 26.0 - 34.0 pg   MCHC 32.3 30.0 - 36.0 g/dL   RDW 18.1 (H) 11.5 - 15.5 %   Platelets 301 150 - 400 K/uL   nRBC 0.0 0.0 - 0.2 %   Neutrophils Relative %  79 %   Neutro Abs 7.7 1.7 - 7.7 K/uL   Lymphocytes Relative 12 %   Lymphs Abs 1.2 0.7 - 4.0 K/uL   Monocytes Relative 8 %   Monocytes Absolute 0.8 0.1 - 1.0 K/uL   Eosinophils Relative 0 %   Eosinophils Absolute 0.0 0.0 - 0.5 K/uL   Basophils Relative 1 %   Basophils Absolute 0.1 0.0 - 0.1 K/uL   Immature Granulocytes 0 %   Abs Immature Granulocytes 0.02 0.00 - 0.07 K/uL    Comment: Performed at [facility], [address] [place], [place] [address]  Comprehensive metabolic panel     Status: Abnormal   Collection Time: 04/07/19  1:27 PM  Result Value Ref Range   Sodium 139 135 - 145 mmol/L   Potassium 4.8 3.5 - 5.1 mmol/L   Chloride 107 98 - 111 mmol/L   CO2 19 (L) 22 - 32 mmol/L   Glucose, Bld 169 (H) 70 - 99 mg/dL   BUN 28 (H) 6 - 20 mg/dL   Creatinine, Ser 2.10 (H) 0.61 - 1.24 mg/dL   Calcium 9.7 8.9 - 10.3 mg/dL   Total Protein 7.1 6.5 - 8.1 g/dL   Albumin 3.9 3.5 - 5.0 g/dL   AST 16 15 - 41 U/L   ALT 15 0 - 44 U/L   Alkaline Phosphatase 88 38 - 126 U/L   Total Bilirubin 1.3 (H) 0.3 - 1.2 mg/dL   GFR calc non Af Amer 35 (L) >60 mL/min   GFR calc Af Amer 40 (L) >60 mL/min   Anion gap 13 5 - 15    Comment: Performed at [facility], [address] [place], [place] [address]  Troponin I - ONCE - STAT     Status: Abnormal   Collection Time: 04/07/19  1:27 PM  Result Value Ref Range   Troponin I 0.03 (HH) <0.03 ng/mL    Comment: CRITICAL RESULT CALLED TO, READ BACK BY AND VERIFIED WITH JESSICA Nguyen Butler 04/07/19 1415 KLW Performed at [facility], [address] [place], [place] [address]    Dg Chest Portable 1 View  Result Date: 04/07/2019 CLINICAL DATA:  Chest pain and shortness of breath for 1-2 days EXAM: PORTABLE CHEST 1 VIEW COMPARISON:  02/23/2019 FINDINGS: Cardiac shadow is enlarged but stable. Increased vascular congestion is noted when compare with the prior exam with interstitial densities consistent with edema. No focal confluent  infiltrate is seen. No sizable effusion is noted. IMPRESSION: Increasing vascular congestion with interstitial edema. Electronically Signed   By: Inez Catalina M.D.   On: 04/07/2019 13:42    Pending Labs [employer] (From  admission, onward)    Start     Ordered   04/14/19 0500  Creatinine, serum  (enoxaparin (LOVENOX)    CrCl < 30 ml/min)  Weekly,   STAT    Comments:  while on enoxaparin therapy.    04/07/19 1426   04/07/19 1326  Brain natriuretic peptide  Once,   STAT     04/07/19 1325          Vitals/Pain Today's Vitals   04/07/19 1313 04/07/19 1314 04/07/19 1330 04/07/19 1400  BP: (!) 120/93 (!) 120/93 106/84 106/78  Pulse: 84 84 74 74  Resp: (!) 34 (!) 34 (!) 21 (!) 30  Temp:  97.7 F (36.5 C)    TempSrc:  Oral    SpO2: 96% 96% 94%   Weight:  101.2 kg    Height:  6\' 1"  (1.854 m)    PainSc:  0-No pain      Isolation Precautions No active isolations  Medications Medications  enoxaparin (LOVENOX) injection 40 mg (has no administration in time range)  sodium chloride flush (NS) 0.9 % injection 3 mL (has no administration in time range)  acetaminophen (TYLENOL) tablet 650 mg (has no administration in time range)    Or  acetaminophen (TYLENOL) suppository 650 mg (has no administration in time range)  polyethylene glycol (MIRALAX / GLYCOLAX) packet 17 g (has no administration in time range)  ondansetron (ZOFRAN) tablet 4 mg (has no administration in time range)    Or  ondansetron (ZOFRAN) injection 4 mg (has no administration in time range)  albuterol (PROVENTIL) (2.5 MG/3ML) 0.083% nebulizer solution 2.5 mg (has no administration in time range)  furosemide (LASIX) 250 mg in dextrose 5 % 250 mL (1 mg/mL) infusion (has no administration in time range)  furosemide (LASIX) injection 40 mg (40 mg Intravenous Given 04/07/19 1358)    Mobility walks Low fall risk   Focused Assessments Cardiac Assessment Handoff:  Cardiac Rhythm: Bundle branch block, Normal sinus  rhythm Lab Results  Component Value Date   TROPONINI 0.03 (HH) 04/07/2019   Lab Results  Component Value Date   DDIMER 1.13 (H) 09/27/2017   Does the Patient currently have chest pain? No     R Recommendations: See Admitting Provider Note  Report given to:   Additional Notes:

## 2019-04-07 NOTE — Progress Notes (Signed)
Today was contacted by Stephen [name] and he wanted to know if I could make a visit.  He complains of shortness of breath and been getting worse past 4 days. Today he is short of breath just standing.  Arrival at his house, he states short of breath, vomiting constant since last night (been vomiting past few weeks but not constant), he denies pain, states has a rattle in center of chest.  Lungs are clear, vitals were stable.  Pulse ox is 95%.  He has no extermity swelling.  Contacted his local PCP and spoke with nurse Opal Sidles at [facility] and after talking with her and unable to get a hold of Dr Wynetta Emery she advised he needs to go to ED.  Contacted an ambulance for transport.  Advised them of patient hx and negative covid19 test a few weeks ago.  Assisted in loading patient.  Will continue to visit for heart failure either by phone or at his home.   Mora [phone]

## 2019-04-07 NOTE — Telephone Encounter (Signed)
Needs to go to the ER.

## 2019-04-07 NOTE — Progress Notes (Signed)
Patient is alert and oriented with SOB at rest. Sats WDL. No complaints of pain. Heart failure packet given to patient and information gone over at this time. Patient verbalized understanding of the importance of diet, medications and daily weights. Consults put in for pharmacy and dietician per CHF protocol. No concerns voiced at this time. Will continue to monitor.   Bethann Punches, RN

## 2019-04-07 NOTE — ED Notes (Signed)
Pt also has a Investment banker, corporate.

## 2019-04-07 NOTE — H&P (Signed)
[facility] [place] NAME: Lydon Vansickle    MR#:  [id number]  DATE OF BIRTH:  1966-07-18  DATE OF ADMISSION:  04/07/2019  PRIMARY CARE PHYSICIAN: Valerie Roys, DO   REQUESTING/REFERRING PHYSICIAN: Dr. Corky Downs  CHIEF COMPLAINT:   Chief Complaint  Patient presents with  . Shortness of Breath    HISTORY OF PRESENT ILLNESS:  Prynce Jacober  is a 53 y.o. male with a known history of chronic systolic CHF with ejection fraction of 10%, AICD, hypertension, CKD stage III presents to the emergency room complaining of worsening shortness of breath over the past 4 days.  Patient was seen by primary care physician and started on antibiotics for possible pneumonia.  He has not had any fever.  White blood cell count is normal.  Chest x-ray showing pulmonary edema.  Patient will be admitted to hospitalist service.  He is visibly tachypneic with ejection fraction of 10% and high risk. In the past patient has not diuresed well with IV Lasix boluses and progress to acute kidney injury.  He has done well on Lasix drip.  PAST MEDICAL HISTORY:   Past Medical History:  Diagnosis Date  . AICD (automatic cardioverter/defibrillator) present   . AKI (acute kidney injury) ([place]) 12/24/2017  . Arrhythmia   . Arthritis    "lower back, knees" (06/10/2018)  . Cardiac arrest ([place]) 12/23/2017   Brief V-fib arrest  . Chest pain 09/08/2017  . CHF (congestive heart failure) (H[facility])    nonischemic cardiomyopathy, EF 25%  . Gout    "on daily RX" (06/10/2018)  . Headache    "q couple months" (06/10/2018)  . High cholesterol   . History of kidney stones   . Hypertension   . Influenza A 12/24/2017  . NICM (nonischemic cardiomyopathy) ([place])   . Pneumonia 12/20/2017  . TIA (transient ischemic attack) 06/21/2016   "still affects my memory a little bit" (06/10/2018)    PAST SURGICAL HISTORY:   Past Surgical History:  Procedure Laterality Date  . EXTERNAL FIXATION LEG  Right ~ 2000   "was going bowlegged; had to brake my leg to fix it"  . HERNIA REPAIR    . ICD IMPLANT N/A 12/30/2017   Procedure: ICD IMPLANT;  Surgeon: Deboraha Sprang, MD;  Location: [facility];  Service: Cardiovascular;  Laterality: N/A;  . LEFT HEART CATH AND CORONARY ANGIOGRAPHY N/A 09/09/2017   Procedure: LEFT HEART CATH AND CORONARY ANGIOGRAPHY;  Surgeon: Teodoro Spray, MD;  Location: [facility];  Service: Cardiovascular;  Laterality: N/A;  . UMBILICAL HERNIA REPAIR  1990s  . V TACH ABLATION  06/10/2018  . V TACH ABLATION N/A 06/10/2018   Procedure: V TACH ABLATION;  Surgeon: Thompson Grayer, MD;  Location: [facility];  Service: Cardiovascular;  Laterality: N/A;  . VASECTOMY      SOCIAL HISTORY:   Social History   Tobacco Use  . Smoking status: Former Smoker    Packs/day: 0.33    Years: 33.00    Pack years: 10.89    Types: Cigarettes    Last attempt to quit: 02/20/2016    Years since quitting: 3.1  . Smokeless tobacco: Never Used  Substance Use Topics  . Alcohol use: Yes    Alcohol/week: 0.0 standard drinks    Comment: 06/10/2018 "beer once/month; if that"    FAMILY HISTORY:   Family History  Problem Relation Age of Onset  . Hypertension Mother   .  Heart failure Mother   . Hypertension Father   . CAD Father   . Heart attack Father     DRUG ALLERGIES:   Allergies  Allergen Reactions  . Lisinopril Cough    REVIEW OF SYSTEMS:   Review of Systems  Constitutional: Positive for malaise/fatigue. Negative for chills and fever.  HENT: Negative for sore throat.   Eyes: Negative for blurred vision, double vision and pain.  Respiratory: Positive for cough and shortness of breath. Negative for hemoptysis and wheezing.   Cardiovascular: Negative for chest pain, palpitations, orthopnea and leg swelling.  Gastrointestinal: Negative for abdominal pain, constipation, diarrhea, heartburn, nausea and vomiting.  Genitourinary: Negative for dysuria  and hematuria.  Musculoskeletal: Negative for back pain and joint pain.  Skin: Negative for rash.  Neurological: Negative for sensory change, speech change, focal weakness and headaches.  Endo/Heme/Allergies: Does not bruise/bleed easily.  Psychiatric/Behavioral: Negative for depression. The patient is not nervous/anxious.     MEDICATIONS AT HOME:   Prior to Admission medications   Medication Sig Start Date End Date Taking? Authorizing Provider  colchicine 0.6 MG tablet Take 1 tablet (0.6 mg total) by mouth daily as needed (gout flare). 09/03/18  Yes Johnson, Megan P, DO  predniSONE (DELTASONE) 20 MG tablet Take 1 tablet (20 mg total) by mouth daily with breakfast. 04/02/19  Yes Johnson, Megan P, DO  spironolactone (ALDACTONE) 25 MG tablet Take 12.5 mg by mouth daily.   Yes [provider]  torsemide (DEMADEX) 20 MG tablet Take 2 tablets (40 mg total) by mouth daily. Patient taking differently: Take 40 mg by mouth 2 (two) times daily.  03/01/19  Yes Fritzi Mandes, MD  acetaminophen (TYLENOL) 325 MG tablet Take 2 tablets (650 mg total) by mouth every 6 (six) hours as needed. Patient taking differently: Take 325-650 mg by mouth every 6 (six) hours as needed for moderate pain.  03/19/18   Carrie Mew, MD  albuterol (PROVENTIL HFA;VENTOLIN HFA) 108 (90 Base) MCG/ACT inhaler Inhale 2 puffs into the lungs every 4 (four) hours as needed for wheezing or shortness of breath. 09/19/18   Johnson, Megan P, DO  allopurinol (ZYLOPRIM) 100 MG tablet Take 2 tablets (200 mg total) by mouth daily. Patient taking differently: Take 200 mg by mouth daily as needed.  10/19/18   Johnson, Megan P, DO  amiodarone (PACERONE) 200 MG tablet Take 1 tablet (200 mg total) by mouth daily. Patient taking differently: Take 400 mg by mouth daily.  01/20/19   Deboraha Sprang, MD  aspirin EC 81 MG EC tablet Take 1 tablet (81 mg total) by mouth daily. 01/01/18   Daune Perch, NP  atorvastatin (LIPITOR) 40 MG tablet Take 80  mg by mouth daily at 6 PM.  10/29/18   [provider]  benzonatate (TESSALON) 200 MG capsule Take 1 capsule (200 mg total) by mouth 2 (two) times daily as needed for cough. 03/17/19   Cannady, Henrine Screws T, NP  CAMPHOR-EUCALYPTUS-MENTHOL EX Apply 1 application topically as needed (congestion).    [provider]  carvedilol (COREG) 3.125 MG tablet Take 1 tablet (3.125 mg total) by mouth 2 (two) times daily with a meal. Patient taking differently: Take 6.25 mg by mouth 2 (two) times daily with a meal.  07/06/18   Leslye Peer, Richard, MD  clopidogrel (PLAVIX) 75 MG tablet TAKE ONE TABLET BY MOUTH EVERY DAY Patient taking differently: Take 75 mg by mouth daily.  02/13/19   Bensimhon, Shaune Pascal, MD  cyclobenzaprine (FLEXERIL) 5 MG tablet  Take 1 tablet (5 mg total) by mouth 3 (three) times daily as needed (jaw pain). 11/24/18   Nance Pear, MD  diclofenac sodium (VOLTAREN) 1 % GEL Apply 2 g topically 4 (four) times daily as needed (pain).     [provider]  doxycycline (VIBRA-TABS) 100 MG tablet Take 1 tablet (100 mg total) by mouth 2 (two) times daily. Patient not taking: Reported on 04/07/2019 04/02/19   Park Liter P, DO  famotidine (PEPCID) 20 MG tablet Take 1 tablet (20 mg total) by mouth 2 (two) times daily for 15 days. Patient taking differently: Take 20 mg by mouth daily as needed.  02/16/19 04/02/19  Arta Silence, MD  fluticasone (FLONASE) 50 MCG/ACT nasal spray Place 2 sprays into both nostrils daily. Patient taking differently: Place 2 sprays into both nostrils daily as needed for allergies.  06/20/17   [name], MD  fluticasone-salmeterol (ADVAIR HFA) [phone] MCG/ACT inhaler Inhale 2 puffs into the lungs 2 (two) times daily. 08/12/18   [name], MD  guaiFENesin (ROBITUSSIN) 100 MG/5ML SOLN Take 5 mLs (100 mg total) by mouth every 4 (four) hours as needed for cough or to loosen phlegm. 03/17/19   Cannady, Henrine Screws T, NP  Menthol, Topical Analgesic, (ICY HOT EX)  Apply 1 application topically daily as needed (pain).    [provider]  methimazole (TAPAZOLE) 10 MG tablet Take 4 tablets (40 mg total) by mouth daily. 12/09/18   Mayo, Pete Pelt, MD  metolazone (ZAROXOLYN) 2.5 MG tablet Take 2.5 mg by mouth as needed.  09/05/18   [provider]  Multiple Vitamin (MULTIVITAMIN WITH MINERALS) TABS tablet Take 1 tablet by mouth daily.    [provider]  nitroGLYCERIN (NITROSTAT) 0.4 MG SL tablet Place 1 tablet (0.4 mg total) under the tongue every 5 (five) minutes as needed for chest pain. 02/19/18   Bensimhon, Shaune Pascal, MD  ondansetron (ZOFRAN ODT) 4 MG disintegrating tablet Take 1 tablet (4 mg total) by mouth every 8 (eight) hours as needed for nausea or vomiting. 01/24/19   Harvest Dark, MD  pantoprazole (PROTONIX) 40 MG tablet Take 1 tablet (40 mg total) by mouth 2 (two) times daily. 03/01/19   Fritzi Mandes, MD  potassium chloride SA (K-DUR,KLOR-CON) 20 MEQ tablet Take 1 tablet (20 mEq total) by mouth 2 (two) times daily. Patient taking differently: Take 20 mEq by mouth 4 (four) times daily.  03/01/19   Fritzi Mandes, MD  promethazine (PHENERGAN) 12.5 MG tablet Take 1 tablet (12.5 mg total) by mouth every 6 (six) hours as needed for nausea or vomiting. 11/24/18   Nance Pear, MD  sacubitril-valsartan (ENTRESTO) 49-51 MG Take 0.5 tablets by mouth 2 (two) times daily.    [provider]  sennosides-docusate sodium (SENOKOT-S) 8.6-50 MG tablet Take 1 tablet by mouth daily.    [provider]  sodium chloride (OCEAN) 0.65 % SOLN nasal spray Place 1 spray into both nostrils as needed for congestion.    [provider]  Tiotropium Bromide Monohydrate (SPIRIVA RESPIMAT) 2.5 MCG/ACT AERS Inhale 1 puff into the lungs daily. 08/12/18   [name], MD     VITAL SIGNS:  Blood pressure 106/78, pulse 74, temperature 97.7 F (36.5 C), temperature source Oral, resp. rate (!) 30, height 6\' 1"  (1.854 m), weight 101.2 kg,  SpO2 94 %.  PHYSICAL EXAMINATION:  Physical Exam  GENERAL:  53 y.o.-year-old patient lying in the bed with conversational dyspnea EYES: Pupils equal, round, reactive to light and accommodation. No  scleral icterus. Extraocular muscles intact.  HEENT: Head atraumatic, normocephalic. Oropharynx and nasopharynx clear. No oropharyngeal erythema, moist oral mucosa  NECK:  Supple, no jugular venous distention. No thyroid enlargement, no tenderness.  LUNGS: Decreased breath sounds at the bases and bilateral crackles. CARDIOVASCULAR: S1, S2 normal. No murmurs, rubs, or gallops.  ABDOMEN: Soft, nontender, nondistended. Bowel sounds present. No organomegaly or mass.  EXTREMITIES: No pedal edema, cyanosis, or clubbing. + 2 pedal & radial pulses b/l.   NEUROLOGIC: Cranial nerves II through XII are intact. No focal Motor or sensory deficits appreciated b/l PSYCHIATRIC: The patient is alert and oriented x 3. Good affect.  SKIN: No obvious rash, lesion, or ulcer.   LABORATORY PANEL:   CBC Recent Labs  Lab 04/07/19 1327  WBC 9.8  HGB 13.9  HCT 43.1  PLT 301   ------------------------------------------------------------------------------------------------------------------  Chemistries  Recent Labs  Lab 04/07/19 1327  NA 139  K 4.8  CL 107  CO2 19*  GLUCOSE 169*  BUN 28*  CREATININE 2.10*  CALCIUM 9.7  AST 16  ALT 15  ALKPHOS 88  BILITOT 1.3*   ------------------------------------------------------------------------------------------------------------------  Cardiac Enzymes Recent Labs  Lab 04/07/19 1327  TROPONINI 0.03*   ------------------------------------------------------------------------------------------------------------------  RADIOLOGY:  Dg Chest Portable 1 View  Result Date: 04/07/2019 CLINICAL DATA:  Chest pain and shortness of breath for 1-2 days EXAM: PORTABLE CHEST 1 VIEW COMPARISON:  02/23/2019 FINDINGS: Cardiac shadow is enlarged but stable. Increased  vascular congestion is noted when compare with the prior exam with interstitial densities consistent with edema. No focal confluent infiltrate is seen. No sizable effusion is noted. IMPRESSION: Increasing vascular congestion with interstitial edema. Electronically Signed   By: Inez Catalina M.D.   On: 04/07/2019 13:42     IMPRESSION AND PLAN:   *Acute on chronic systolic congestive heart failure with ejection fraction of 10% Patient has significant pulmonary edema on chest x-ray.  He has received IV Lasix 40 mg one-time dose in the emergency room.  In the past he has done well with Lasix drip.  We will start him on Lasix drip at 8 mg an hour.  Monitor input and output.  Repeat BUN and creatinine in the morning and replace potassium as needed.  Daily weight.  Patient is on Coreg and Delene Loll that will be continued. Patient has AICD in place  *CKD stage III.  Monitor while being diuresed.  *Hypertension.  Continue medications  DVT prophylaxis with renally dosed Lovenox  All the records are reviewed and case discussed with ED provider. Management plans discussed with the patient, family and they are in agreement.  CODE STATUS: FULL CODE  TOTAL TIME TAKING CARE OF THIS PATIENT: 35 minutes.   Leia Alf Dorla Guizar M.D on 04/07/2019 at 2:34 PM  Between 7am to 6pm - Pager - [phone]  After 6pm go to [email or web address] - password [email or web address] [facility]  Office  [phone]  CC: Primary care physician; Valerie Roys, DO  Note: This dictation was prepared with Dragon dictation along with smaller phrase technology. Any transcriptional errors that result from this process are unintentional.

## 2019-04-07 NOTE — Telephone Encounter (Signed)
Patient is calling to report SOB- can not catch his breath. Patient states he has no energy- can't walk across room with out SOB and rattling in chest above the sternum.Marita [place] reports patient has labored breathing walking 10 feet. Patient has been vomiting since last night- clear liquid.( worse today) Patient has had on/off vomiting- but reports that the vomiting today is worse- weakness is worse. Medication- steroids and inhalers not helping at all. Vitals reported: BP- 118/80 P 80 O2- 95% Patient feels that he is getting worse and wants to go to hospital. Call to office- but they are closed for lunch- ok Marita [place] to call for transport to take patient on to hospital.   Reason for Disposition . [1] MODERATE difficulty breathing (e.g., speaks in phrases, SOB even at rest, pulse 100-120) AND [2] NEW-onset or WORSE than normal    Patient is stating his breathing is worse- he "can't get enough air in" he has labored breathing with exertion- he is under treatment and not improving. Patient advised ED- he wants to go.  Answer Assessment - Initial Assessment Questions 1. RESPIRATORY STATUS: "Describe your breathing?" (e.g., wheezing, shortness of breath, unable to speak, severe coughing)      Feels like not getting enough air in- short of breath- seems labored with exertion 2. ONSET: "When did this breathing problem begin?"      Worse over last 4 days 3. PATTERN "Does the difficult breathing come and go, or has it been constant since it started?"      Constant- feels he can't  Get enough air in 4. SEVERITY: "How bad is your breathing?" (e.g., mild, moderate, severe)    - MILD: No SOB at rest, mild SOB with walking, speaks normally in sentences, can lay down, no retractions, pulse < 100.    - MODERATE: SOB at rest, SOB with minimal exertion and prefers to sit, cannot lie down flat, speaks in phrases, mild retractions, audible wheezing, pulse 100-120.    - SEVERE: Very SOB at rest, speaks in single words,  struggling to breathe, sitting hunched forward, retractions, pulse > 120      moderate 5. RECURRENT SYMPTOM: "Have you had difficulty breathing before?" If so, ask: "When was the last time?" and "What happened that time?"      In December patient had pneumonia and was admitted to hospital 6. CARDIAC HISTORY: "Do you have any history of heart disease?" (e.g., heart attack, angina, bypass surgery, angioplasty)      Yes- CHF 7. LUNG HISTORY: "Do you have any history of lung disease?"  (e.g., pulmonary embolus, asthma, emphysema)     no 8. CAUSE: "What do you think is causing the breathing problem?"      unknown 9. OTHER SYMPTOMS: "Do you have any other symptoms? (e.g., dizziness, runny nose, cough, chest pain, fever)     Runny nose, cough 10. PREGNANCY: "Is there any chance you are pregnant?" "When was your last menstrual period?"       n/a 11. TRAVEL: "Have you traveled out of the country in the last month?" (e.g., travel history, exposures)       No travel, no known exposure  Protocols used: BREATHING DIFFICULTY-A-AH

## 2019-04-07 NOTE — ED Triage Notes (Signed)
Pt comes from home via EMS after c/o SOB. Pt tested neg for covid 4 weeks ago. Has been seeing doctor regularly to figure out what is going on. Pt has taken antibiotic and prednisone round with no relief. Pt has tried albuterol and no relief. Pt has CHF and is on heart transplant list. Pt EF is 10-15% normally. Pt also vomitted all night a clear frothy vomit. Pt has phenergan at home with no relief.

## 2019-04-07 NOTE — ED Provider Notes (Signed)
[facility] Emergency Department Provider Note   ____________________________________________    I have reviewed the triage vital signs and the nursing notes.   HISTORY  Chief Complaint Shortness of Breath     HPI Stephen [name] is a 53 y.o. male who presents with complaints of shortness of breath.  Patient reports worsening shortness of breath over the last 4 days.  Patient reports he discussed with his PCP who thought he may have pneumonia and started him on antibiotics.  He denies fevers or chills.  No recent travel.  No exposure to novel coronavirus known.  No sick contacts.  He has been staying at homeless directed.  Shortness of breath does not appear to be worse with lying flat.  No chest pain.  No leg swelling.  Reports compliance with his torsemide  Past Medical History:  Diagnosis Date  . AICD (automatic cardioverter/defibrillator) present   . AKI (acute kidney injury) ([place]) 12/24/2017  . Arrhythmia   . Arthritis    "lower back, knees" (06/10/2018)  . Cardiac arrest ([place]) 12/23/2017   Brief V-fib arrest  . Chest pain 09/08/2017  . CHF (congestive heart failure) (HCC)    nonischemic cardiomyopathy, EF 25%  . Gout    "on daily RX" (06/10/2018)  . Headache    "q couple months" (06/10/2018)  . High cholesterol   . History of kidney stones   . Hypertension   . Influenza A 12/24/2017  . NICM (nonischemic cardiomyopathy) ([place])   . Pneumonia 12/20/2017  . TIA (transient ischemic attack) 06/21/2016   "still affects my memory a little bit" (06/10/2018)    Patient Active Problem List   Diagnosis Date Noted  . Cough 03/17/2019  . 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 HTN (hypertension) 11/20/2018  . Hypokalemia 08/19/2018  . Syncope 06/30/2018  . CKD (chronic kidney disease) stage 3, GFR 30-59 ml/min (HCC) 05/13/2018  . Hyperlipidemia 05/13/2018  . ED (erectile dysfunction) 05/13/2018  . Osteoarthritis of right knee  04/30/2018  . Gout 03/26/2018  . VF (ventricular fibrillation) ([place])   . Nonischemic cardiomyopathy ([place])   . Cardiogenic shock ([place]) 12/24/2017  . 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 History of non-ST elevation myocardial infarction (NSTEMI) 09/08/2017  . Bradycardia 08/30/2017  . Ventricular tachycardia ([place]) 07/29/2017  . COPD (chronic obstructive pulmonary disease) ([place]) 07/04/2017  . TIA (transient ischemic attack) 06/22/2016  . Benign hypertensive renal disease 06/22/2016  . Chronic systolic congestive heart failure ([place]) 01/20/2016  . Cardiomyopathy ([place]) 01/20/2016    Past Surgical History:  Procedure Laterality Date  . EXTERNAL FIXATION LEG Right ~ 2000   "was going bowlegged; had to brake my leg to fix it"  . HERNIA REPAIR    . ICD IMPLANT N/A 12/30/2017   Procedure: ICD IMPLANT;  Surgeon: Deboraha Sprang, MD;  Location: [facility];  Service: Cardiovascular;  Laterality: N/A;  . LEFT HEART CATH AND CORONARY ANGIOGRAPHY N/A 09/09/2017   Procedure: LEFT HEART CATH AND CORONARY ANGIOGRAPHY;  Surgeon: Teodoro Spray, MD;  Location: [facility];  Service: Cardiovascular;  Laterality: N/A;  . UMBILICAL HERNIA REPAIR  1990s  . V TACH ABLATION  06/10/2018  . V TACH ABLATION N/A 06/10/2018   Procedure: V TACH ABLATION;  Surgeon: Thompson Grayer, MD;  Location: [facility];  Service: Cardiovascular;  Laterality: N/A;  . VASECTOMY      Prior to Admission medications   Medication Sig Start Date End Date Taking? Authorizing  Provider  acetaminophen (TYLENOL) 325 MG tablet Take 2 tablets (650 mg total) by mouth every 6 (six) hours as needed. Patient taking differently: Take 325-650 mg by mouth every 6 (six) hours as needed for moderate pain.  03/19/18   Carrie Mew, MD  albuterol (PROVENTIL HFA;VENTOLIN HFA) 108 (90 Base) MCG/ACT inhaler Inhale 2 puffs into the lungs every 4 (four) hours as needed for wheezing or shortness of breath. 09/19/18   Johnson, Megan P, DO   allopurinol (ZYLOPRIM) 100 MG tablet Take 2 tablets (200 mg total) by mouth daily. Patient taking differently: Take 200 mg by mouth daily as needed.  10/19/18   Johnson, Megan P, DO  amiodarone (PACERONE) 200 MG tablet Take 1 tablet (200 mg total) by mouth daily. Patient taking differently: Take 400 mg by mouth daily.  01/20/19   Deboraha Sprang, MD  aspirin EC 81 MG EC tablet Take 1 tablet (81 mg total) by mouth daily. 01/01/18   Daune Perch, NP  atorvastatin (LIPITOR) 40 MG tablet Take 80 mg by mouth daily at 6 PM.  10/29/18   [provider]  benzonatate (TESSALON) 200 MG capsule Take 1 capsule (200 mg total) by mouth 2 (two) times daily as needed for cough. 03/17/19   Cannady, Henrine Screws T, NP  CAMPHOR-EUCALYPTUS-MENTHOL EX Apply 1 application topically as needed (congestion).    [provider]  carvedilol (COREG) 3.125 MG tablet Take 1 tablet (3.125 mg total) by mouth 2 (two) times daily with a meal. Patient taking differently: Take 6.25 mg by mouth 2 (two) times daily with a meal.  07/06/18   Leslye Peer, Richard, MD  clopidogrel (PLAVIX) 75 MG tablet TAKE ONE TABLET BY MOUTH EVERY DAY Patient taking differently: Take 75 mg by mouth daily.  02/13/19   Bensimhon, Shaune Pascal, MD  colchicine 0.6 MG tablet Take 1 tablet (0.6 mg total) by mouth daily as needed (gout flare). 09/03/18   Johnson, Megan P, DO  cyclobenzaprine (FLEXERIL) 5 MG tablet Take 1 tablet (5 mg total) by mouth 3 (three) times daily as needed (jaw pain). 11/24/18   Nance Pear, MD  diclofenac sodium (VOLTAREN) 1 % GEL Apply 2 g topically 4 (four) times daily as needed (pain).     [provider]  doxycycline (VIBRA-TABS) 100 MG tablet Take 1 tablet (100 mg total) by mouth 2 (two) times daily. Patient not taking: Reported on 04/07/2019 04/02/19   Park Liter P, DO  famotidine (PEPCID) 20 MG tablet Take 1 tablet (20 mg total) by mouth 2 (two) times daily for 15 days. Patient taking differently: Take 20 mg by  mouth daily as needed.  02/16/19 04/02/19  Arta Silence, MD  fluticasone (FLONASE) 50 MCG/ACT nasal spray Place 2 sprays into both nostrils daily. Patient taking differently: Place 2 sprays into both nostrils daily as needed for allergies.  06/20/17   [name], MD  fluticasone-salmeterol (ADVAIR HFA) [phone] MCG/ACT inhaler Inhale 2 puffs into the lungs 2 (two) times daily. 08/12/18   [name], MD  guaiFENesin (ROBITUSSIN) 100 MG/5ML SOLN Take 5 mLs (100 mg total) by mouth every 4 (four) hours as needed for cough or to loosen phlegm. 03/17/19   Cannady, Henrine Screws T, NP  Menthol, Topical Analgesic, (ICY HOT EX) Apply 1 application topically daily as needed (pain).    [provider]  methimazole (TAPAZOLE) 10 MG tablet Take 4 tablets (40 mg total) by mouth daily. 12/09/18   Mayo, Pete Pelt, MD  metolazone (ZAROXOLYN) 2.5 MG tablet Take 2.5  mg by mouth as needed.  09/05/18   [provider]  Multiple Vitamin (MULTIVITAMIN WITH MINERALS) TABS tablet Take 1 tablet by mouth daily.    [provider]  nitroGLYCERIN (NITROSTAT) 0.4 MG SL tablet Place 1 tablet (0.4 mg total) under the tongue every 5 (five) minutes as needed for chest pain. 02/19/18   Bensimhon, Shaune Pascal, MD  ondansetron (ZOFRAN ODT) 4 MG disintegrating tablet Take 1 tablet (4 mg total) by mouth every 8 (eight) hours as needed for nausea or vomiting. 01/24/19   Harvest Dark, MD  pantoprazole (PROTONIX) 40 MG tablet Take 1 tablet (40 mg total) by mouth 2 (two) times daily. 03/01/19   Fritzi Mandes, MD  potassium chloride SA (K-DUR,KLOR-CON) 20 MEQ tablet Take 1 tablet (20 mEq total) by mouth 2 (two) times daily. Patient taking differently: Take 20 mEq by mouth 4 (four) times daily.  03/01/19   Fritzi Mandes, MD  predniSONE (DELTASONE) 20 MG tablet Take 1 tablet (20 mg total) by mouth daily with breakfast. 04/02/19   Park Liter P, DO  promethazine (PHENERGAN) 12.5 MG tablet Take 1 tablet (12.5 mg total) by mouth  every 6 (six) hours as needed for nausea or vomiting. 11/24/18   Nance Pear, MD  sacubitril-valsartan (ENTRESTO) 49-51 MG Take 0.5 tablets by mouth 2 (two) times daily.    [provider]  sennosides-docusate sodium (SENOKOT-S) 8.6-50 MG tablet Take 1 tablet by mouth daily.    [provider]  sodium chloride (OCEAN) 0.65 % SOLN nasal spray Place 1 spray into both nostrils as needed for congestion.    [provider]  spironolactone (ALDACTONE) 25 MG tablet Take 12.5 mg by mouth daily.    [provider]  Tiotropium Bromide Monohydrate (SPIRIVA RESPIMAT) 2.5 MCG/ACT AERS Inhale 1 puff into the lungs daily. 08/12/18   [name], MD  torsemide (DEMADEX) 20 MG tablet Take 2 tablets (40 mg total) by mouth daily. Patient taking differently: Take 40 mg by mouth 2 (two) times daily.  03/01/19   Fritzi Mandes, MD     Allergies Lisinopril  Family History  Problem Relation Age of Onset  . Hypertension Mother   . Heart failure Mother   . Hypertension Father   . CAD Father   . Heart attack Father     Social History Social History   Tobacco Use  . Smoking status: Former Smoker    Packs/day: 0.33    Years: 33.00    Pack years: 10.89    Types: Cigarettes    Last attempt to quit: 02/20/2016    Years since quitting: 3.1  . Smokeless tobacco: Never Used  Substance Use Topics  . Alcohol use: Yes    Alcohol/week: 0.0 standard drinks    Comment: 06/10/2018 "beer once/month; if that"  . Drug use: Never    Review of Systems  Constitutional: No fever/chills Eyes: No visual changes.  ENT: No sore throat. Cardiovascular: Denies chest pain. Respiratory: As above Gastrointestinal: No abdominal pain.  No nausea, no vomiting.   Genitourinary: Negative for dysuria. Musculoskeletal: Negative for back pain. Skin: Negative for rash. Neurological: Negative for headaches or weakness   ____________________________________________   PHYSICAL EXAM:  VITAL  SIGNS: ED Triage Vitals  Enc Vitals Group     BP 04/07/19 1313 (!) 120/93     Pulse Rate 04/07/19 1313 84     Resp 04/07/19 1313 (!) 34     Temp 04/07/19 1314 97.7 F (36.5 C)     Temp  Source 04/07/19 1314 Oral     SpO2 04/07/19 1313 96 %     Weight 04/07/19 1314 101.2 kg (223 lb)     Height 04/07/19 1314 1.854 m (6\' 1" )     Head Circumference --      Peak Flow --      Pain Score 04/07/19 1314 0     Pain Loc --      Pain Edu? --      Excl. in [place]? --     Constitutional: Alert and oriented. No acute distress. Pleasant and interactive Eyes: Conjunctivae are normal.    Cardiovascular: Normal rate, regular rhythm. Grossly normal heart sounds.  Good peripheral circulation. Respiratory: Normal respiratory effort.  No retractions.  Bibasilar Rales Gastrointestinal: Soft and nontender. No distention.  No CVA tenderness. Genitourinary: deferred Musculoskeletal: No lower extremity tenderness nor edema.  Warm and well perfused Neurologic:  Normal speech and language. No gross focal neurologic deficits are appreciated.  Skin:  Skin is warm, dry and intact. No rash noted. Psychiatric: Mood and affect are normal. Speech and behavior are normal.  ____________________________________________   LABS (all labs ordered are listed, but only abnormal results are displayed)  Labs Reviewed  CBC WITH DIFFERENTIAL/PLATELET - Abnormal; Notable for the following components:      Result Value   RDW 18.1 (*)    All other components within normal limits  COMPREHENSIVE METABOLIC PANEL  TROPONIN I  BRAIN NATRIURETIC PEPTIDE   ____________________________________________  EKG  ED ECG REPORT I, Lavonia Drafts, the attending physician, personally viewed and interpreted this ECG.   Rhythm: normal sinus rhythm QRS Axis: Abnormal Intervals: Bundle branch block ST/T Wave abnormalities: Nonspecific changes Narrative Interpretation: no evidence of acute ischemia   ____________________________________________  RADIOLOGY  Chest x-ray demonstrates pulmonary edema ____________________________________________   PROCEDURES  Procedure(s) performed: No  Procedures   Critical Care performed: No ____________________________________________   INITIAL IMPRESSION / ASSESSMENT AND PLAN / ED COURSE  Pertinent labs & imaging results that were available during my care of the patient were reviewed by me and considered in my medical decision making (see chart for details).  Patient presents with shortness of breath worse over the last 4 days, history of CHF.  Denies fevers or chills.  Differential includes CHF exacerbation, pneumonia, novel coronavirus.  We will obtain x-ray, labs and reevaluate  Chest x-ray is consistent with pulmonary edema lab work thus far is reassuring, will give IV Lasix and admit to the hospital service   Stephen [name] was evaluated in Emergency Department on 04/07/2019 for the symptoms described in the history of present illness. He was evaluated in the context of the global COVID-19 pandemic, which necessitated consideration that the patient might be at risk for infection with the SARS-CoV-2 virus that causes COVID-19. Institutional protocols and algorithms that pertain to the evaluation of patients at risk for COVID-19 are in a state of rapid change based on information released by regulatory bodies including the CDC and federal and state organizations. These policies and algorithms were followed during the patient's care in the ED.     ____________________________________________   FINAL CLINICAL IMPRESSION(S) / ED DIAGNOSES  Final diagnoses:  Acute on chronic congestive heart failure, unspecified heart failure type [facility])  Pulmonary edema cardiac cause [facility])        Note:  This document was prepared using Dragon voice recognition software and may include unintentional dictation errors.   Lavonia Drafts, MD 04/07/19  1401

## 2019-04-08 ENCOUNTER — Encounter: Payer: Commercial Managed Care - PPO | Admitting: *Deleted

## 2019-04-08 ENCOUNTER — Telehealth: Payer: Self-pay

## 2019-04-08 LAB — BASIC METABOLIC PANEL
Anion gap: 10 (ref 5–15)
BUN: 33 mg/dL — ABNORMAL HIGH (ref 6–20)
CO2: 21 mmol/L — ABNORMAL LOW (ref 22–32)
Calcium: 9.8 mg/dL (ref 8.9–10.3)
Chloride: 107 mmol/L (ref 98–111)
Creatinine, Ser: 2.54 mg/dL — ABNORMAL HIGH (ref 0.61–1.24)
GFR calc Af Amer: 32 mL/min — ABNORMAL LOW (ref >60)
GFR calc non Af Amer: 28 mL/min — ABNORMAL LOW (ref >60)
Glucose, Bld: 134 mg/dL — ABNORMAL HIGH (ref 70–99)
Potassium: 4.5 mmol/L (ref 3.5–5.1)
Sodium: 138 mmol/L (ref 135–145)

## 2019-04-08 LAB — LIPASE, BLOOD: Lipase: 17 U/L (ref 11–51)

## 2019-04-08 MED ORDER — CARVEDILOL 3.125 MG PO TABS
3.1250 mg | ORAL_TABLET | Freq: Two times a day (BID) | ORAL | Status: DC
  Administered 2019-04-11: 3.125 mg via ORAL
  Filled 2019-04-08 (×3): qty 1

## 2019-04-08 MED ORDER — SPIRONOLACTONE 25 MG PO TABS
25.0000 mg | ORAL_TABLET | Freq: Every day | ORAL | Status: DC
  Administered 2019-04-08 – 2019-04-11 (×4): 25 mg via ORAL
  Filled 2019-04-08 (×4): qty 1

## 2019-04-08 MED ORDER — BISMUTH SUBSALICYLATE 262 MG/15ML PO SUSP
30.0000 mL | ORAL | Status: DC | PRN
  Administered 2019-04-08: 30 mL via ORAL
  Filled 2019-04-08: qty 118

## 2019-04-08 MED ORDER — POLYETHYLENE GLYCOL 3350 17 G PO PACK
17.0000 g | PACK | Freq: Two times a day (BID) | ORAL | Status: AC
  Filled 2019-04-08: qty 1

## 2019-04-08 MED ORDER — SACUBITRIL-VALSARTAN 24-26 MG PO TABS
1.0000 | ORAL_TABLET | Freq: Two times a day (BID) | ORAL | Status: DC
  Administered 2019-04-08: 1 via ORAL
  Filled 2019-04-08 (×2): qty 1

## 2019-04-08 MED ORDER — ALLOPURINOL 100 MG PO TABS
100.0000 mg | ORAL_TABLET | Freq: Every day | ORAL | Status: DC
  Administered 2019-04-08 – 2019-04-15 (×8): 100 mg via ORAL
  Filled 2019-04-08 (×8): qty 1

## 2019-04-08 MED ORDER — ADULT MULTIVITAMIN W/MINERALS CH
1.0000 | ORAL_TABLET | Freq: Every day | ORAL | Status: DC
  Administered 2019-04-08 – 2019-04-15 (×8): 1 via ORAL
  Filled 2019-04-08 (×8): qty 1

## 2019-04-08 MED ORDER — ENSURE ENLIVE PO LIQD
237.0000 mL | Freq: Two times a day (BID) | ORAL | Status: DC
  Administered 2019-04-08 – 2019-04-15 (×5): 237 mL via ORAL

## 2019-04-08 MED ORDER — DICLOFENAC SODIUM 1 % TD GEL
2.0000 g | Freq: Three times a day (TID) | TRANSDERMAL | Status: DC | PRN
  Filled 2019-04-08: qty 100

## 2019-04-08 NOTE — Telephone Encounter (Signed)
Left message for patient to remind of missed remote transmission.  

## 2019-04-08 NOTE — TOC Initial Note (Signed)
Transition of Care [facility]) - Initial/Assessment Note    Patient Details  Name: Stephen [name] MRN: [id number] Date of Birth: 01-Sep-1966  Transition of Care [facility]) CM/SW Contact:    Elza Rafter, RN Phone Number: 04/08/2019, 2:36 PM  Clinical Narrative:        Patient is from home alone; independent in all adl's.  Admitted with acute on chronic CHF.  Current with PCP; obtains medications at [facility] without difficulty.  Current with the heart failure clinic.  Has a functioning scale at home and weighs daily.  No transportation issues.  He occasionally drives and also has a friend that will transport as needed.  No further needs identified at this time.  Will not qualify for home health services.   Does use paramedicine-[facility].             Expected Discharge Plan: Home/Self Care Barriers to Discharge: Continued Medical Work up   Patient Goals and CMS Choice        Expected Discharge Plan and Services Expected Discharge Plan: Home/Self Care   Discharge Planning Services: CM Consult   Living arrangements for the past 2 months: Single Family Home                          Prior Living Arrangements/Services Living arrangements for the past 2 months: Single Family Home Lives with:: Self Patient language and need for interpreter reviewed:: Yes Do you feel safe going back to the place where you live?: Yes            Criminal Activity/Legal Involvement Pertinent to Current Situation/Hospitalization: No - Comment as needed  Activities of Daily Living Home Assistive Devices/Equipment: None ADL Screening (condition at time of admission) Patient's cognitive ability adequate to safely complete daily activities?: Yes Is the patient deaf or have difficulty hearing?: No Does the patient have difficulty seeing, even when wearing glasses/contacts?: No Does the patient have difficulty concentrating, remembering, or making decisions?: No Patient able to express need for  assistance with ADLs?: Yes Does the patient have difficulty dressing or bathing?: No Independently performs ADLs?: Yes (appropriate for developmental age) Does the patient have difficulty walking or climbing stairs?: No Weakness of Legs: None Weakness of Arms/Hands: None  Permission Sought/Granted                  Emotional Assessment Appearance:: Appears stated age Attitude/Demeanor/Rapport: Self-Confident, Gracious Affect (typically observed): Accepting Orientation: : Oriented to Self, Oriented to Place, Oriented to  Time, Oriented to Situation Alcohol / Substance Use: Not Applicable    Admission diagnosis:  Pulmonary edema cardiac cause () [I50.1] Acute on chronic congestive heart failure, unspecified heart failure type [facility]) [I50.9] Patient Active Problem List   Diagnosis Date Noted  . CHF (congestive heart failure) ([place]) 04/07/2019  . Cough 03/17/2019  . 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 HTN (hypertension) 11/20/2018  . Hypokalemia 08/19/2018  . Syncope 06/30/2018  . CKD (chronic kidney disease) stage 3, GFR 30-59 ml/min (HCC) 05/13/2018  . Hyperlipidemia 05/13/2018  . ED (erectile dysfunction) 05/13/2018  . Osteoarthritis of right knee 04/30/2018  . Gout 03/26/2018  . VF (ventricular fibrillation) ([place])   . Nonischemic cardiomyopathy ([place])   . Cardiogenic shock ([place]) 12/24/2017  . 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 History of non-ST elevation myocardial infarction (NSTEMI) 09/08/2017  . Bradycardia 08/30/2017  . Ventricular tachycardia ([place]) 07/29/2017  . COPD (chronic obstructive pulmonary disease) ([place]) 07/04/2017  . TIA (  transient ischemic attack) 06/22/2016  . Benign hypertensive renal disease 06/22/2016  . Chronic systolic congestive heart failure ([place]) 01/20/2016  . Cardiomyopathy ([place]) 01/20/2016   PCP:  Valerie Roys, DO Pharmacy:   [facility], [place] [address] [place] [address] Phone:  [phone] Fax: [phone]     Social Determinants of Health (SDOH) Interventions    Readmission Risk Interventions Readmission Risk Prevention Plan 04/08/2019  Medication Review ([place]) Complete  PCP or Specialist appointment within 3-5 days of discharge Complete  HRI or [place] Complete  SW Recovery Care/Counseling Consult Patient refused  Palliative Care Screening Not [place] Not Applicable  Some recent data might be hidden

## 2019-04-08 NOTE — Consult Note (Signed)
[facility]  CARDIOLOGY CONSULT NOTE  Patient ID: Stephen [name] MRN: [id number] DOB/AGE: Sep 03, 1966 53 y.o.  Admit date: 04/07/2019 Referring Physician [place] Primary Physician [facility] Primary Cardiologist [facility] Reason for Consultation congestive heart failure  HPI: 53 year old gentleman referred for evaluation of congestive heart failure.  The patient has known severe dilated cardiomyopathy with chronic systolic congestive heart failure.  Patient presented to [facility] ED with 4-day history of worsening shortness of breath, that is outpatient pneumonia, with admission chest x-ray revealing pulmonary edema.  Labs were notable for a BUN and creatinine of 33 and 2.54, respectively.  The patient was read with IV Lasix then placed on Lasix infusion with overall clinical improvement.  Initial troponin was 0.03.  BNP was greater than 4500.  Patient has known ventricular tachycardia with frequent PVCs, on amiodarone, status post ICD.  The patient was recently hospitalized for acute on chronic systolic congestive heart failure at which time 2D echocardiogram on 02/24/2019 revealed severe dilated cardiomyopathy, with LVEF of 10%, with moderate to severe mitral regurgitation.  Review of systems complete and found to be negative unless listed above     Past Medical History:  Diagnosis Date  . AICD (automatic cardioverter/defibrillator) present   . AKI (acute kidney injury) ([place]) 12/24/2017  . Arrhythmia   . Arthritis    "lower back, knees" (06/10/2018)  . Cardiac arrest ([place]) 12/23/2017   Brief V-fib arrest  . Chest pain 09/08/2017  . CHF (congestive heart failure) (HCC)    nonischemic cardiomyopathy, EF 25%  . Gout    "on daily RX" (06/10/2018)  . Headache    "q couple months" (06/10/2018)  . High cholesterol   . History of kidney stones   . Hypertension   . Influenza A 12/24/2017  . NICM (nonischemic cardiomyopathy) ([place])   . Pneumonia 12/20/2017  . TIA (transient ischemic attack)  06/21/2016   "still affects my memory a little bit" (06/10/2018)    Past Surgical History:  Procedure Laterality Date  . EXTERNAL FIXATION LEG Right ~ 2000   "was going bowlegged; had to brake my leg to fix it"  . HERNIA REPAIR    . ICD IMPLANT N/A 12/30/2017   Procedure: ICD IMPLANT;  Surgeon: Deboraha Sprang, MD;  Location: [place] CV LAB;  Service: Cardiovascular;  Laterality: N/A;  . LEFT HEART CATH AND CORONARY ANGIOGRAPHY N/A 09/09/2017   Procedure: LEFT HEART CATH AND CORONARY ANGIOGRAPHY;  Surgeon: Teodoro Spray, MD;  Location: [place] [facility];  Service: Cardiovascular;  Laterality: N/A;  . UMBILICAL HERNIA REPAIR  1990s  . V TACH ABLATION  06/10/2018  . V TACH ABLATION N/A 06/10/2018   Procedure: V TACH ABLATION;  Surgeon: Thompson Grayer, MD;  Location: [place] [facility];  Service: Cardiovascular;  Laterality: N/A;  . VASECTOMY      Medications Prior to Admission  Medication Sig Dispense Refill Last Dose  . acetaminophen (TYLENOL) 325 MG tablet Take 2 tablets (650 mg total) by mouth every 6 (six) hours as needed. (Patient taking differently: Take 325-650 mg by mouth every 6 (six) hours as needed for moderate pain. ) 60 tablet 0 Past Week at Unknown time  . albuterol (PROVENTIL HFA;VENTOLIN HFA) 108 (90 Base) MCG/ACT inhaler Inhale 2 puffs into the lungs every 4 (four) hours as needed for wheezing or shortness of breath. 1 Inhaler 0 prn at prn  . allopurinol (ZYLOPRIM) 100 MG tablet Take 2 tablets (200 mg total) by mouth daily. (Patient taking differently: Take 200 mg  by mouth daily as needed. ) 60 tablet 6 04/07/2019 at 1000  . amiodarone (PACERONE) 200 MG tablet Take 1 tablet (200 mg total) by mouth daily. (Patient taking differently: Take 400 mg by mouth daily. )   04/07/2019 at 1000  . aspirin EC 81 MG EC tablet Take 1 tablet (81 mg total) by mouth daily. 90 tablet 3 04/07/2019 at 1000  . atorvastatin (LIPITOR) 40 MG tablet Take 80 mg by mouth daily at 6 PM.   6 04/06/2019 at  1800  . benzonatate (TESSALON) 200 MG capsule Take 1 capsule (200 mg total) by mouth 2 (two) times daily as needed for cough. 20 capsule 0 prn at prn  . CAMPHOR-EUCALYPTUS-MENTHOL EX Apply 1 application topically as needed (congestion).   prn at prn  . carvedilol (COREG) 3.125 MG tablet Take 1 tablet (3.125 mg total) by mouth 2 (two) times daily with a meal. (Patient taking differently: Take 6.25 mg by mouth 2 (two) times daily with a meal. ) 60 tablet 0 04/07/2019 at 1000  . clopidogrel (PLAVIX) 75 MG tablet TAKE ONE TABLET BY MOUTH EVERY DAY (Patient taking differently: Take 75 mg by mouth daily. ) 30 tablet 5 04/07/2019 at 1000  . colchicine 0.6 MG tablet Take 1 tablet (0.6 mg total) by mouth daily as needed (gout flare). 6 tablet 1 prn at prn  . cyclobenzaprine (FLEXERIL) 5 MG tablet Take 1 tablet (5 mg total) by mouth 3 (three) times daily as needed (jaw pain). 20 tablet 0 prn at prn  . diclofenac sodium (VOLTAREN) 1 % GEL Apply 2 g topically 4 (four) times daily as needed (pain).    prn at prn  . doxycycline (VIBRA-TABS) 100 MG tablet Take 1 tablet (100 mg total) by mouth 2 (two) times daily. 20 tablet 0 04/07/2019 at 1000  . famotidine (PEPCID) 20 MG tablet Take 1 tablet (20 mg total) by mouth 2 (two) times daily for 15 days. (Patient taking differently: Take 20 mg by mouth daily as needed. ) 30 tablet 0 prn at prn  . fluticasone (FLONASE) 50 MCG/ACT nasal spray Place 2 sprays into both nostrils daily. (Patient taking differently: Place 2 sprays into both nostrils daily as needed for allergies. ) 16 g 2 prn at prn  . fluticasone-salmeterol (ADVAIR HFA) 115-21 MCG/ACT inhaler Inhale 2 puffs into the lungs 2 (two) times daily. 1 Inhaler 12 04/07/2019 at 1000  . guaiFENesin (ROBITUSSIN) 100 MG/5ML SOLN Take 5 mLs (100 mg total) by mouth every 4 (four) hours as needed for cough or to loosen phlegm. 236 mL 0 prn at prn  . Menthol, Topical Analgesic, (ICY HOT EX) Apply 1 application topically daily as needed  (pain).   prn at prn  . methimazole (TAPAZOLE) 10 MG tablet Take 4 tablets (40 mg total) by mouth daily. 120 tablet 0 04/07/2019 at 1000  . metolazone (ZAROXOLYN) 2.5 MG tablet Take 2.5 mg by mouth as needed.   11 prn at prn  . Multiple Vitamin (MULTIVITAMIN WITH MINERALS) TABS tablet Take 1 tablet by mouth daily.   04/07/2019 at 1000  . nitroGLYCERIN (NITROSTAT) 0.4 MG SL tablet Place 1 tablet (0.4 mg total) under the tongue every 5 (five) minutes as needed for chest pain. 25 tablet 3 prn at prn  . ondansetron (ZOFRAN ODT) 4 MG disintegrating tablet Take 1 tablet (4 mg total) by mouth every 8 (eight) hours as needed for nausea or vomiting. 20 tablet 0 prn at prn  . pantoprazole (PROTONIX) 40 MG  tablet Take 1 tablet (40 mg total) by mouth 2 (two) times daily. 60 tablet 2 04/07/2019 at 1000  . potassium chloride SA (K-DUR,KLOR-CON) 20 MEQ tablet Take 1 tablet (20 mEq total) by mouth 2 (two) times daily. (Patient taking differently: Take 20 mEq by mouth 4 (four) times daily. ) 30 tablet 1 04/07/2019 at 1000  . predniSONE (DELTASONE) 20 MG tablet Take 1 tablet (20 mg total) by mouth daily with breakfast. 7 tablet 0 04/07/2019 at 1000  . promethazine (PHENERGAN) 12.5 MG tablet Take 1 tablet (12.5 mg total) by mouth every 6 (six) hours as needed for nausea or vomiting. 20 tablet 0 prn at prn  . sacubitril-valsartan (ENTRESTO) 49-51 MG Take 0.5 tablets by mouth 2 (two) times daily.   04/07/2019 at 1000  . sennosides-docusate sodium (SENOKOT-S) 8.6-50 MG tablet Take 1 tablet by mouth daily.   04/07/2019 at 1000  . sodium chloride (OCEAN) 0.65 % SOLN nasal spray Place 1 spray into both nostrils as needed for congestion.   prn at prn  . spironolactone (ALDACTONE) 25 MG tablet Take 12.5 mg by mouth daily.   04/07/2019 at 1000  . Tiotropium Bromide Monohydrate (SPIRIVA RESPIMAT) 2.5 MCG/ACT AERS Inhale 1 puff into the lungs daily. 1 Inhaler 12 04/07/2019 at 1000  . torsemide (DEMADEX) 20 MG tablet Take 2 tablets (40 mg total) by  mouth daily. (Patient taking differently: Take 40 mg by mouth 2 (two) times daily. ) 60 tablet 0 04/07/2019 at 1000   Social History   Socioeconomic History  . Marital status: Single    Spouse name: Not on file  . Number of children: Not on file  . Years of education: Not on file  . Highest education level: Not on file  Occupational History  . Not on file  Social Needs  . Financial resource strain: Not on file  . Food insecurity:    Worry: Not on file    Inability: Not on file  . Transportation needs:    Medical: Not on file    Non-medical: Not on file  Tobacco Use  . Smoking status: Former Smoker    Packs/day: 0.33    Years: 33.00    Pack years: 10.89    Types: Cigarettes    Last attempt to quit: 02/20/2016    Years since quitting: 3.1  . Smokeless tobacco: Never Used  Substance and Sexual Activity  . Alcohol use: Yes    Alcohol/week: 0.0 standard drinks    Comment: 06/10/2018 "beer once/month; if that"  . Drug use: Never  . Sexual activity: Yes  Lifestyle  . Physical activity:    Days per week: Not on file    Minutes per session: Not on file  . Stress: Not on file  Relationships  . Social connections:    Talks on phone: Not on file    Gets together: Not on file    Attends religious service: Not on file    Active member of club or organization: Not on file    Attends meetings of clubs or organizations: Not on file    Relationship status: Not on file  . Intimate partner violence:    Fear of current or ex partner: Not on file    Emotionally abused: Not on file    Physically abused: Not on file    Forced sexual activity: Not on file  Other Topics Concern  . Not on file  Social History Narrative   Independent at baseline, ambulates steadily  Family History  Problem Relation Age of Onset  . Hypertension Mother   . Heart failure Mother   . Hypertension Father   . CAD Father   . Heart attack Father       Review of systems complete and found to be negative  unless listed above      PHYSICAL EXAM  General: Well developed, well nourished, in no acute distress HEENT:  Normocephalic and atramatic Neck:  No JVD.  Lungs: Clear bilaterally to auscultation and percussion. Heart: HRRR . Normal S1 and S2 without gallops or murmurs.  Abdomen: Bowel sounds are positive, abdomen soft and non-tender  Msk:  Back normal, normal gait. Normal strength and tone for age. Extremities: No clubbing, cyanosis or edema.   Neuro: Alert and oriented X 3. Psych:  Good affect, responds appropriately  Labs:   Lab Results  Component Value Date   WBC 9.8 04/07/2019   HGB 13.9 04/07/2019   HCT 43.1 04/07/2019   MCV 89.2 04/07/2019   PLT 301 04/07/2019    Recent Labs  Lab 04/07/19 1327 04/08/19 0756  NA 139 138  K 4.8 4.5  CL 107 107  CO2 19* 21*  BUN 28* 33*  CREATININE 2.10* 2.54*  CALCIUM 9.7 9.8  PROT 7.1  --   BILITOT 1.3*  --   ALKPHOS 88  --   ALT 15  --   AST 16  --   GLUCOSE 169* 134*   Lab Results  Component Value Date   TROPONINI 0.03 (HH) 04/07/2019    Lab Results  Component Value Date   CHOL 217 (H) 12/03/2018   CHOL 184 09/19/2018   CHOL 181 06/16/2018   Lab Results  Component Value Date   HDL 47 12/03/2018   HDL 42 09/19/2018   HDL 36 (L) 06/16/2018   Lab Results  Component Value Date   LDLCALC 134 (H) 12/03/2018   LDLCALC 123 (H) 09/19/2018   LDLCALC 123 (H) 06/16/2018   Lab Results  Component Value Date   TRIG 179 (H) 12/03/2018   TRIG 94 09/19/2018   TRIG 110 06/16/2018   Lab Results  Component Value Date   CHOLHDL 4.6 12/03/2018   CHOLHDL 4.8 06/22/2016   No results found for: LDLDIRECT    Radiology: Dg Chest Portable 1 View  Result Date: 04/07/2019 CLINICAL DATA:  Chest pain and shortness of breath for 1-2 days EXAM: PORTABLE CHEST 1 VIEW COMPARISON:  02/23/2019 FINDINGS: Cardiac shadow is enlarged but stable. Increased vascular congestion is noted when compare with the prior exam with interstitial  densities consistent with edema. No focal confluent infiltrate is seen. No sizable effusion is noted. IMPRESSION: Increasing vascular congestion with interstitial edema. Electronically Signed   By: Inez Catalina M.D.   On: 04/07/2019 13:42    EKG: Atrial sensing with ventricular pacing  ASSESSMENT AND PLAN:   1.  Acute on chronic systolic congestive heart failure with pulmonary edema, improved after Lasix IV and infusion 2.  Severe dilated cardiomyopathy, with LVEF 10%, with recurrent hospitalizations for acute on chronic systolic congestive heart failure, ? candidate for heart transplant 3.  Acute kidney injury with underlying CKD stage III  Recommendations  1.  Agree with current therapy 2.  Continue diuresis 3.  Carefully monitor renal status 4.  Await nephrology recommendations 5.  Continue CHF clinic as outpatient 6.  Evaluation for advanced heart failure care at [facility] following discharge, to be arranged by [name] as outpatient  Signed: Isaias Cowman MD,PhD,  [facility] 04/08/2019, 2:13 PM

## 2019-04-08 NOTE — Progress Notes (Signed)
[facility]  ROUNDING NOTE   Subjective:   Mr. NATHANEAL SOMMERS admitted to [facility] on 04/07/2019 for Pulmonary edema cardiac cause [facility]) [I50.1] Acute on chronic congestive heart failure, unspecified heart failure type ([place]) [I50.9]  Started on furosemide gtt overnight.   Patient states his edema has improved but still short of breath.   Objective:  Vital signs in last 24 hours:  Temp:  [97.7 F (36.5 C)-98.5 F (36.9 C)] 97.7 F (36.5 C) (04/08 0756) Pulse Rate:  [63-107] 86 (04/08 0756) Resp:  [20-30] 20 (04/08 0756) BP: (100-141)/(75-110) 112/95 (04/08 0756) SpO2:  [96 %-100 %] 98 % (04/08 0756) Weight:  [99 kg-100.3 kg] 100.3 kg (04/08 0343)  Weight change:  Filed Weights   04/07/19 1314 04/07/19 1523 04/08/19 0343  Weight: 101.2 kg 99 kg 100.3 kg    Intake/Output: I/O last 3 completed shifts: In: 577.5 [P.O.:480; I.V.:97.5] Out: 1010 [Urine:1010]   Intake/Output this shift:  Total I/O In: 600 [P.O.:600] Out: 225 [Urine:225]  Physical Exam: General: NAD,   Head: Normocephalic, atraumatic. Moist oral mucosal membranes  Eyes: Anicteric, PERRL  Neck: Supple, trachea midline  Lungs:  Bilateral crackles  Heart: Regular rate and rhythm  Abdomen:  Soft, nontender,   Extremities:  peripheral edema.  Neurologic: Nonfocal, moving all four extremities  Skin: No lesions       Basic Metabolic Panel: Recent Labs  Lab 04/07/19 1327 04/08/19 0756  NA 139 138  K 4.8 4.5  CL 107 107  CO2 19* 21*  GLUCOSE 169* 134*  BUN 28* 33*  CREATININE 2.10* 2.54*  CALCIUM 9.7 9.8    Liver Function Tests: Recent Labs  Lab 04/07/19 1327  AST 16  ALT 15  ALKPHOS 88  BILITOT 1.3*  PROT 7.1  ALBUMIN 3.9   Recent Labs  Lab 04/08/19 0756  LIPASE 17   No results for input(s): AMMONIA in the last 168 hours.  CBC: Recent Labs  Lab 04/07/19 1327  WBC 9.8  NEUTROABS 7.7  HGB 13.9  HCT 43.1  MCV 89.2  PLT 301    Cardiac Enzymes: Recent Labs  Lab  04/07/19 1327  TROPONINI 0.03*    BNP: Invalid input(s): POCBNP  CBG: No results for input(s): GLUCAP in the last 168 hours.  Microbiology: Results for orders placed or performed in visit on 03/17/19  Novel Coronavirus, NAA ([facility])     Status: None   Collection Time: 03/17/19  9:51 AM  Result Value Ref Range Status   SARS-CoV-2, NAA Not Detected Not Detected Final    Comment: Testing was performed using the cobas(R) SARS-CoV-2 test. This test was developed and its performance characteristics determined by [employer]. This test has not been FDA cleared or approved. This test has been authorized by FDA under an Emergency Use Authorization (EUA). This test is only authorized for the duration of time the declaration that circumstances exist justifying the authorization of the emergency use of in vitro diagnostic tests for detection of SARS-CoV-2 virus and/or diagnosis of COVID-19 infection under section 564(b)(1) of the Act, 21 U.S.C. 240XBD-5(H)(2), unless the authorization is terminated or revoked sooner.     Coagulation Studies: No results for input(s): LABPROT, INR in the last 72 hours.  Urinalysis: No results for input(s): COLORURINE, LABSPEC, PHURINE, GLUCOSEU, HGBUR, BILIRUBINUR, KETONESUR, PROTEINUR, UROBILINOGEN, NITRITE, LEUKOCYTESUR in the last 72 hours.  Invalid input(s): APPERANCEUR    Imaging: Dg Chest Portable 1 View  Result Date: 04/07/2019 CLINICAL DATA:  Chest pain and shortness of  breath for 1-2 days EXAM: PORTABLE CHEST 1 VIEW COMPARISON:  02/23/2019 FINDINGS: Cardiac shadow is enlarged but stable. Increased vascular congestion is noted when compare with the prior exam with interstitial densities consistent with edema. No focal confluent infiltrate is seen. No sizable effusion is noted. IMPRESSION: Increasing vascular congestion with interstitial edema. Electronically Signed   By: Inez Catalina M.D.   On: 04/07/2019 13:42     Medications:   .  furosemide (LASIX) infusion 6 mg/hr (04/08/19 1112)   . allopurinol  100 mg Oral Daily  . amiodarone  400 mg Oral Daily  . aspirin EC  81 mg Oral Daily  . atorvastatin  80 mg Oral q1800  . carvedilol  3.125 mg Oral BID WC  . clopidogrel  75 mg Oral Daily  . enoxaparin (LOVENOX) injection  40 mg Subcutaneous Q24H  . feeding supplement (ENSURE ENLIVE)  237 mL Oral BID BM  . methimazole  40 mg Oral Daily  . mometasone-formoterol  2 puff Inhalation BID  . multivitamin with minerals  1 tablet Oral Daily  . pantoprazole  40 mg Oral BID  . polyethylene glycol  17 g Oral BID  . predniSONE  20 mg Oral Q breakfast  . sacubitril-valsartan  1 tablet Oral BID  . senna-docusate  1 tablet Oral Daily  . sodium chloride flush  3 mL Intravenous Q12H  . spironolactone  25 mg Oral Daily  . tiotropium  1 capsule Inhalation Daily   acetaminophen **OR** acetaminophen, albuterol, benzonatate, bismuth subsalicylate, cyclobenzaprine, diclofenac sodium, guaiFENesin, nitroGLYCERIN, ondansetron **OR** ondansetron (ZOFRAN) IV, polyethylene glycol  Assessment/ Plan:  Mr. FAVIAN KITTLESON is a 53 y.o. black male with chronic systolic heart failure ejection fraction 10-15%, prior history of cardiac arrest, hyperlipidemia, nephrolithiasis, hypertension, history of TIA, was admitted to [facility] on4/07/2019 for Pulmonary edema cardiac cause ([place]) [I50.1] Acute on chronic congestive heart failure, unspecified heart failure type ([place]) [I50.9]  1. Acute renal failure: secondary to acute cardiorenal syndrome 2. Chronic kidney disease stage III baseline creatinine 1.85, GFR of 47 on 01/24/19. Bland urine.  3. Acute on Chronic systolic heart failure. 4.  Metabolic acidosis 5. Hyperthyroidism  Plan - Continue furosemide gtt.   - Encouraged patient to take methimazole.  - Holding entresto and metolazone.  - Restart spironolactone   LOS: 1 Peggy Loge 4/8/20201:53 PM

## 2019-04-08 NOTE — Progress Notes (Addendum)
Initial Nutrition Assessment  RD working remotely.  DOCUMENTATION CODES:   Not applicable  INTERVENTION:   -Ensure Enlive po BID, each supplement provides 350 kcal and 20 grams of protein -MVI with minerals daily -Educated pt on low sodium diet for CHF; provided "Low Sodium Nutrition Therapy", "Sodium Free Seasoning Tips", and "Fluid Restricted Nutrition Therapy" handout from [facility] Nutrition Care Manual (handouts pasted to discharge instructions in chart and pt is aware)  NUTRITION DIAGNOSIS:   Inadequate oral intake related to decreased appetite, nausea as evidenced by meal completion < 50%, percent weight loss.  GOAL:   Patient will meet greater than or equal to 90% of their needs  MONITOR:   PO intake, Supplement acceptance, Labs, Weight trends, Skin, I & O's  REASON FOR ASSESSMENT:   Consult Diet education  ASSESSMENT:   Stephen [name]  is a 53 y.o. male with a known history of chronic systolic CHF with ejection fraction of 10%, AICD, hypertension, CKD stage III presents to the emergency room complaining of worsening shortness of breath over the past 4 days.  Patient was seen by primary care physician and started on antibiotics for possible pneumonia.  He has not had any fever.  White blood cell count is normal.  Chest x-ray showing pulmonary edema.  Patient will be admitted to hospitalist service.  He is visibly tachypneic with ejection fraction of 10% and high risk.  Pt admitted with CHF.   Reviewed I/O's: -433 ml x 24 hours  UOP: 1 L x 24 hours  Spoke with pt via telephone, who was pleasant and in good spirits today. He reports feeling better after repositioning himself in bed secondary to nausea. Pt reports he has had nausea for over a month and his appetite has been poor because of it. He estimates that over the past month, he has consumed only one meal per day (consisting either of grits ot a [place] sandwich on wheat bread with mayonnaise). He drinks mostly water,  but will occasionally drink an Ensure or Premier Protein supplement. He reports only consuming "a little bit" of breakfast (just a few bites of scrambled eggs). Noted meal completion documented at 75-100%.   Pt reports his UBW is around 225-230#. He endorses wt loss over the past month, secondary to poor oral intake (pt does not believe that weight loss is secondary to diuresis). He has a scale at home and he weighs himself 3 times per week on average. He is concerned that his weight "has dropped to 212#". Reviewed wt hx, which reveals a 6.4% wt loss over the past 3 months, which while not significant for time frame, is concerning given poor oral intake. Suspect fluid overload may also be masking true weight loss as well as fat and muscle depletions.   RD provided "Low Sodium Nutrition Therapy", "Sodium Free Seasoning Tips", and "Fluid Restricted Nutrition Therapy" handouts from the Academy of [employer] and [employer]etics. Reviewed patient's dietary recall. Provided examples on ways to decrease sodium intake in diet. Discouraged intake of processed foods and use of salt shaker. Encouraged fresh fruits and vegetables as well as whole grain sources of carbohydrates to maximize fiber intake. RD discussed why it is important for patient to adhere to diet recommendations, and emphasized the role of fluids, foods to avoid, and importance of weighing self daily. Teach back method used. Expect good compliance. Pt with good understanding of diet and self-management of CHF.  Discussed importance of good meal and supplement intake to promote healing. Pt amenable to  supplements; will try Ensure.   Labs reviewed.   NUTRITION - FOCUSED PHYSICAL EXAM:    Most Recent Value  Orbital Region  Unable to assess  Upper Arm Region  Unable to assess  Thoracic and Lumbar Region  Unable to assess  Buccal Region  Unable to assess  Temple Region  Unable to assess  Clavicle Bone Region  Unable to assess  Clavicle and Acromion Bone  Region  Unable to assess  Scapular Bone Region  Unable to assess  Dorsal Hand  Unable to assess  Patellar Region  Unable to assess  Anterior Thigh Region  Unable to assess  Posterior Calf Region  Unable to assess  Edema (RD Assessment)  Unable to assess  Hair  Unable to assess  Eyes  Unable to assess  Mouth  Unable to assess  Skin  Unable to assess  Nails  Unable to assess       Diet Order:   Diet Order            Diet Heart Room service appropriate? Yes; Fluid consistency: Thin; Fluid restriction: 1500 mL Fluid  Diet effective now              EDUCATION NEEDS:   Education needs have been addressed  Skin:  Skin Assessment: Reviewed RN Assessment  Last BM:  04/06/19  Height:   Ht Readings from Last 1 Encounters:  04/07/19 6\' 1"  (1.854 m)    Weight:   Wt Readings from Last 1 Encounters:  04/08/19 100.3 kg    Ideal Body Weight:  83.6 kg  BMI:  Body mass index is 29.17 kg/m.  Estimated Nutritional Needs:   Kcal:  2100-2300  Protein:  105-120 grams  Fluid:  1.5 L    Trevia Nop A. Jimmye Norman, RD, LDN, [place] Registered Dietitian II Certified Diabetes Care and Education Specialist Pager: [phone] After hours Pager: [phone]

## 2019-04-08 NOTE — Progress Notes (Signed)
Ch visited w/ pt to see how well he was progressing. Ch was familiar w/ pt from previous hospitalization. Pt shared that he was too sick to hv his consult regarding the heart transplant process but realizes that he is still holding fluid and having difficulty keeping his K levels in a healthy range. Pt does adhere to a heart healthy diet but is still having issues related to his heart and gout. Pt shared that his father passed in his 29s due to heart issues and the pt shared that he wants to live which is why he is hopeful to get a transplant. Pt stated he has received care at [facility] was present while cardio phys was present to share w/ the pt possible options that could be temporary yet keep him from being in the hospital. Pt understood. Ch provided a compassionate presence and the pt was thankful for the visit.    04/08/19 1500  Clinical Encounter Type  Visited With Patient;Health care provider  Visit Type Psychological support;Spiritual support;Social support  Spiritual Encounters  Spiritual Needs Emotional;Grief support  Stress Factors  Patient Stress Factors Exhausted;Major life changes;Health changes

## 2019-04-08 NOTE — Progress Notes (Signed)
Stephen [name] at [place] NAME: Stephen [name]    MR#:  [id number]  DATE OF BIRTH:  02/09/1966  SUBJECTIVE:  CHIEF COMPLAINT:   Chief Complaint  Patient presents with  . Shortness of Breath   Continues to have shortness of breath.  He has nausea today.  Does have intermittent nausea and vomiting at home.  Responds to Pepto-Bismol.  This has been ordered.   REVIEW OF SYSTEMS:    Review of Systems  Constitutional: Negative for chills and fever.  HENT: Negative for sore throat.   Eyes: Negative for blurred vision, double vision and pain.  Respiratory: Positive for shortness of breath. Negative for cough, hemoptysis and wheezing.   Cardiovascular: Negative for chest pain, palpitations, orthopnea and leg swelling.  Gastrointestinal: Positive for nausea and vomiting. Negative for abdominal pain, constipation, diarrhea and heartburn.  Genitourinary: Negative for dysuria and hematuria.  Musculoskeletal: Negative for back pain and joint pain.  Skin: Negative for rash.  Neurological: Negative for sensory change, speech change, focal weakness and headaches.  Endo/Heme/Allergies: Does not bruise/bleed easily.  Psychiatric/Behavioral: Negative for depression. The patient is not nervous/anxious.     DRUG ALLERGIES:   Allergies  Allergen Reactions  . Lisinopril Cough    VITALS:  Blood pressure (!) 112/95, pulse 86, temperature 97.7 F (36.5 C), temperature source Oral, resp. rate 20, height 6\' 1"  (1.854 m), weight 100.3 kg, SpO2 98 %.  PHYSICAL EXAMINATION:   Physical Exam  GENERAL:  53 y.o.-year-old patient lying in the bed with no acute distress.  EYES: Pupils equal, round, reactive to light and accommodation. No scleral icterus. Extraocular muscles intact.  HEENT: Head atraumatic, normocephalic. Oropharynx and nasopharynx clear.  NECK:  Supple, no jugular venous distention. No thyroid enlargement, no tenderness.  LUNGS: Normal breath  sounds bilaterally, no wheezing, rales, rhonchi. No use of accessory muscles of respiration.  CARDIOVASCULAR: S1, S2 normal. No murmurs, rubs, or gallops.  ABDOMEN: Soft, nontender, nondistended. Bowel sounds present. No organomegaly or mass.  EXTREMITIES: No cyanosis, clubbing or edema b/l.    NEUROLOGIC: Cranial nerves II through XII are intact. No focal Motor or sensory deficits b/l.   PSYCHIATRIC: The patient is alert and oriented x 3.  SKIN: No obvious rash, lesion, or ulcer.   LABORATORY PANEL:   CBC Recent Labs  Lab 04/07/19 1327  WBC 9.8  HGB 13.9  HCT 43.1  PLT 301   ------------------------------------------------------------------------------------------------------------------ Chemistries  Recent Labs  Lab 04/07/19 1327 04/08/19 0756  NA 139 138  K 4.8 4.5  CL 107 107  CO2 19* 21*  GLUCOSE 169* 134*  BUN 28* 33*  CREATININE 2.10* 2.54*  CALCIUM 9.7 9.8  AST 16  --   ALT 15  --   ALKPHOS 88  --   BILITOT 1.3*  --    ------------------------------------------------------------------------------------------------------------------  Cardiac Enzymes Recent Labs  Lab 04/07/19 1327  TROPONINI 0.03*   ------------------------------------------------------------------------------------------------------------------  RADIOLOGY:  Dg Chest Portable 1 View  Result Date: 04/07/2019 CLINICAL DATA:  Chest pain and shortness of breath for 1-2 days EXAM: PORTABLE CHEST 1 VIEW COMPARISON:  02/23/2019 FINDINGS: Cardiac shadow is enlarged but stable. Increased vascular congestion is noted when compare with the prior exam with interstitial densities consistent with edema. No focal confluent infiltrate is seen. No sizable effusion is noted. IMPRESSION: Increasing vascular congestion with interstitial edema. Electronically Signed   By: Stephen [name] M.D.   On: 04/07/2019 13:42     ASSESSMENT AND PLAN:   *  Acute on chronic systolic congestive heart failure with ejection  fraction of 10% Patient is on Coreg and Entresto. Will decrease dose of Lasix drip due to worsening creatinine. Requested cardiology consultation.  Message sent to Dr. Stephani [name] Urine output 1 L since admission.  *CKD stage III.    Mild worsening of creatinine today.  Lasix dose decreased.  Discussed with Dr. Juleen [name] of nephrology who will see the patient.  *Hypertension.  Continue medications  DVT prophylaxis with renally dosed Lovenox  All the records are reviewed and case discussed with Care Management/Social Workerr. Management plans discussed with the patient, family and they are in agreement.  CODE STATUS: FULL CODE  DVT Prophylaxis: SCDs  TOTAL TIME TAKING CARE OF THIS PATIENT: 35 minutes.   POSSIBLE D/C IN 2-3 DAYS, DEPENDING ON CLINICAL CONDITION.  Stephen [name] M.D on 04/08/2019 at 11:08 AM  Between 7am to 6pm - Pager - [phone]  After 6pm go to [email or web address] - password [email or web address] [facility]  Office  [phone]  CC: Primary care physician; Stephen Roys, DO  Note: This dictation was prepared with Dragon dictation along with smaller phrase technology. Any transcriptional errors that result from this process are unintentional.

## 2019-04-09 ENCOUNTER — Telehealth: Payer: Self-pay

## 2019-04-09 LAB — BASIC METABOLIC PANEL
Anion gap: 11 (ref 5–15)
BUN: 37 mg/dL — ABNORMAL HIGH (ref 6–20)
CO2: 24 mmol/L (ref 22–32)
Calcium: 9.2 mg/dL (ref 8.9–10.3)
Chloride: 104 mmol/L (ref 98–111)
Creatinine, Ser: 2.42 mg/dL — ABNORMAL HIGH (ref 0.61–1.24)
GFR calc Af Amer: 34 mL/min — ABNORMAL LOW (ref >60)
GFR calc non Af Amer: 29 mL/min — ABNORMAL LOW (ref >60)
Glucose, Bld: 108 mg/dL — ABNORMAL HIGH (ref 70–99)
Potassium: 3.8 mmol/L (ref 3.5–5.1)
Sodium: 139 mmol/L (ref 135–145)

## 2019-04-09 MED ORDER — POTASSIUM CHLORIDE CRYS ER 20 MEQ PO TBCR
20.0000 meq | EXTENDED_RELEASE_TABLET | Freq: Two times a day (BID) | ORAL | Status: DC
  Administered 2019-04-09 – 2019-04-15 (×12): 20 meq via ORAL
  Filled 2019-04-09 (×12): qty 1

## 2019-04-09 MED ORDER — FUROSEMIDE 10 MG/ML IJ SOLN
6.0000 mg/h | INTRAVENOUS | Status: DC
  Administered 2019-04-09 – 2019-04-10 (×2): 6 mg/h via INTRAVENOUS
  Filled 2019-04-09: qty 16
  Filled 2019-04-09: qty 25

## 2019-04-09 NOTE — Progress Notes (Signed)
Spoke with Dr. Juleen China re: continuing Lasix gtt, orders received to continue for 24 hours.

## 2019-04-09 NOTE — Progress Notes (Signed)
[facility]  SUBJECTIVE: The patient reports feeling somewhat better today in regards to his breathing. He denies chest pain. He reports that his peripheral edema has returned to baseline.   Vitals:   04/08/19 1929 04/09/19 0507 04/09/19 0700 04/09/19 0758  BP: 90/72 101/77  91/77  Pulse: (!) 28 83  (!) 45  Resp:  18  18  Temp: 98.1 F (36.7 C) 98.6 F (37 C)  (!) 97.4 F (36.3 C)  TempSrc: Oral Oral  Oral  SpO2: 97% 96%  92%  Weight:   99.8 kg   Height:         Intake/Output Summary (Last 24 hours) at 04/09/2019 0854 Last data filed at 04/09/2019 0731 Gross per 24 hour  Intake 1383.51 ml  Output 725 ml  Net 658.51 ml      PHYSICAL EXAM  General: Well developed, well nourished, in no acute distress, sitting on side of bed eating breakfast HEENT:  Normocephalic and atramatic Neck:  No JVD.  Lungs: Clear bilaterally to auscultation, normal effort of breathing on room air. Heart: HRRR . Normal S1 and S2 without gallops or murmurs.  Abdomen: nondistended Msk:  Back normal, gait not assessed. Apparent normal strength and tone for age. Extremities: Trace bilateral lower extremity edema, R>L Neuro: Alert and oriented X 3. Psych:  Good affect, responds appropriately   LABS: Basic Metabolic Panel: Recent Labs    04/08/19 0756 04/09/19 0747  NA 138 139  K 4.5 3.8  CL 107 104  CO2 21* 24  GLUCOSE 134* 108*  BUN 33* 37*  CREATININE 2.54* 2.42*  CALCIUM 9.8 9.2   Liver Function Tests: Recent Labs    04/07/19 1327  AST 16  ALT 15  ALKPHOS 88  BILITOT 1.3*  PROT 7.1  ALBUMIN 3.9   Recent Labs    04/08/19 0756  LIPASE 17   CBC: Recent Labs    04/07/19 1327  WBC 9.8  NEUTROABS 7.7  HGB 13.9  HCT 43.1  MCV 89.2  PLT 301   Cardiac Enzymes: Recent Labs    04/07/19 1327  TROPONINI 0.03*   BNP: Invalid input(s): POCBNP D-Dimer: No results for input(s): DDIMER in the last 72 hours. Hemoglobin A1C: No results for input(s): HGBA1C in the last 72  hours. Fasting Lipid Panel: No results for input(s): CHOL, HDL, LDLCALC, TRIG, CHOLHDL, LDLDIRECT in the last 72 hours. Thyroid Function Tests: No results for input(s): TSH, T4TOTAL, T3FREE, THYROIDAB in the last 72 hours.  Invalid input(s): FREET3 Anemia Panel: No results for input(s): VITAMINB12, FOLATE, FERRITIN, TIBC, IRON, RETICCTPCT in the last 72 hours.  Dg Chest Portable 1 View  Result Date: 04/07/2019 CLINICAL DATA:  Chest pain and shortness of breath for 1-2 days EXAM: PORTABLE CHEST 1 VIEW COMPARISON:  02/23/2019 FINDINGS: Cardiac shadow is enlarged but stable. Increased vascular congestion is noted when compare with the prior exam with interstitial densities consistent with edema. No focal confluent infiltrate is seen. No sizable effusion is noted. IMPRESSION: Increasing vascular congestion with interstitial edema. Electronically Signed   By: Inez Catalina M.D.   On: 04/07/2019 13:42     TELEMETRY: sinus rhythm, 70s  ASSESSMENT AND PLAN:  Active Problems:   CHF (congestive heart failure) ([place])    1. Acute on chronic systolic CHF with pulmonary edema, improved after IV Lasix and drip 2. 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 Possible heart transplant candidate. 3. Acute kidney injury  with CKD stage III, nephrology following 4. Ventricular tachycardia with frequent PVCs, on amiodarone, status post AICD. Upon review of telemetry strips, observed frequent PVCs. No bradycardia noted on strips.  Recommendations: 1. Agree with current therapy 2. Continue amiodarone 400 mg once daily 3. Continue carvedilol 3.125 mg BID 4. Continue aspirin and Plavix 5. Continue furosemide drip and spironolactone, holding metolazone per nephrology recommendations 6. Carefully monitor blood pressure response with Entresto 7. Continue CHF clinic as outpatient 8. Evaluation for advanced heart failure care at  [facility] following discharge, to be arranged by [name] as outpatient. Please make follow-up appointment with [name] for 1 week after discharge.    Clabe Seal, PA-C 04/09/2019 8:54 AM

## 2019-04-09 NOTE — Telephone Encounter (Signed)
LMOM reference E-VISIT change time to 9:00 if agreeable for 04/21/2019

## 2019-04-09 NOTE — Progress Notes (Signed)
[place] [facility] [place] NAME: Trimaine Maser    MR#:  [id number]  DATE OF BIRTH:  Nov 24, 1966  SUBJECTIVE:  CHIEF COMPLAINT:   Chief Complaint  Patient presents with  . Shortness of Breath    His nausea has resolved. Shortness of breath improving.  REVIEW OF SYSTEMS:    Review of Systems  Constitutional: Negative for chills and fever.  HENT: Negative for sore throat.   Eyes: Negative for blurred vision, double vision and pain.  Respiratory: Positive for shortness of breath. Negative for cough, hemoptysis and wheezing.   Cardiovascular: Negative for chest pain, palpitations, orthopnea and leg swelling.  Gastrointestinal: Positive for nausea and vomiting. Negative for abdominal pain, constipation, diarrhea and heartburn.  Genitourinary: Negative for dysuria and hematuria.  Musculoskeletal: Negative for back pain and joint pain.  Skin: Negative for rash.  Neurological: Negative for sensory change, speech change, focal weakness and headaches.  Endo/Heme/Allergies: Does not bruise/bleed easily.  Psychiatric/Behavioral: Negative for depression. The patient is not nervous/anxious.     DRUG ALLERGIES:   Allergies  Allergen Reactions  . Lisinopril Cough    VITALS:  Blood pressure 91/77, pulse (!) 45, temperature (!) 97.4 F (36.3 C), temperature source Oral, resp. rate 18, height 6\' 1"  (1.854 m), weight 99.8 kg, SpO2 92 %.  PHYSICAL EXAMINATION:   Physical Exam  GENERAL:  53 y.o.-year-old patient lying in the bed with no acute distress.  EYES: Pupils equal, round, reactive to light and accommodation. No scleral icterus. Extraocular muscles intact.  HEENT: Head atraumatic, normocephalic. Oropharynx and nasopharynx clear.  NECK:  Supple, no jugular venous distention. No thyroid enlargement, no tenderness.  LUNGS: Normal breath sounds bilaterally, no wheezing, rales, rhonchi. No use of accessory muscles of respiration.  CARDIOVASCULAR:  S1, S2 normal. No murmurs, rubs, or gallops.  ABDOMEN: Soft, nontender, nondistended. Bowel sounds present. No organomegaly or mass.  EXTREMITIES: No cyanosis, clubbing or edema b/l.    NEUROLOGIC: Cranial nerves II through XII are intact. No focal Motor or sensory deficits b/l.   PSYCHIATRIC: The patient is alert and oriented x 3.  SKIN: No obvious rash, lesion, or ulcer.   LABORATORY PANEL:   CBC Recent Labs  Lab 04/07/19 1327  WBC 9.8  HGB 13.9  HCT 43.1  PLT 301   ------------------------------------------------------------------------------------------------------------------ Chemistries  Recent Labs  Lab 04/07/19 1327  04/09/19 0747  NA 139   < > 139  K 4.8   < > 3.8  CL 107   < > 104  CO2 19*   < > 24  GLUCOSE 169*   < > 108*  BUN 28*   < > 37*  CREATININE 2.10*   < > 2.42*  CALCIUM 9.7   < > 9.2  AST 16  --   --   ALT 15  --   --   ALKPHOS 88  --   --   BILITOT 1.3*  --   --    < > = values in this interval not displayed.   ------------------------------------------------------------------------------------------------------------------  Cardiac Enzymes Recent Labs  Lab 04/07/19 1327  TROPONINI 0.03*   ------------------------------------------------------------------------------------------------------------------  RADIOLOGY:  Dg Chest Portable 1 View  Result Date: 04/07/2019 CLINICAL DATA:  Chest pain and shortness of breath for 1-2 days EXAM: PORTABLE CHEST 1 VIEW COMPARISON:  02/23/2019 FINDINGS: Cardiac shadow is enlarged but stable. Increased vascular congestion is noted when compare with the prior exam with interstitial densities consistent with edema. No focal confluent infiltrate  is seen. No sizable effusion is noted. IMPRESSION: Increasing vascular congestion with interstitial edema. Electronically Signed   By: Inez Catalina M.D.   On: 04/07/2019 13:42     ASSESSMENT AND PLAN:   *Acute on chronic systolic congestive heart failure with ejection  fraction of 10% Patient is on Coreg and Entresto. Delene Loll was stopped due to acute kidney injury Lasix drip dose decreased yesterday. Requested cardiology consultation.  Seen by Dr. Stephani Police Shortness of breath has improved some.  As of breath is improved.  We will continue IV Lasix 1 more day.  Likely discharge tomorrow.  *CKD stage III.     Creatinine has improved  *Hypertension.  Continue medications  DVT prophylaxis with renally dosed Lovenox  All the records are reviewed and case discussed with Care Management/Social Workerr. Management plans discussed with the patient, family and they are in agreement.  CODE STATUS: FULL CODE  DVT Prophylaxis: SCDs  TOTAL TIME TAKING CARE OF THIS PATIENT: 35 minutes.   POSSIBLE D/C IN 2-3 DAYS, DEPENDING ON CLINICAL CONDITION.  Leia Alf Wilbern Pennypacker M.D on 04/09/2019 at 10:49 AM  Between 7am to 6pm - Pager - [phone]  After 6pm go to [email or web address] - password [email or web address] [place] [facility]  Office  [phone]  CC: Primary care physician; Valerie Roys, DO  Note: This dictation was prepared with Dragon dictation along with smaller phrase technology. Any transcriptional errors that result from this process are unintentional.

## 2019-04-09 NOTE — Progress Notes (Signed)
[facility]  ROUNDING NOTE   Subjective:   UOP 1175mL - furosemide gtt 6mg /hr  Breathing better.   Objective:  Vital signs in last 24 hours:  Temp:  [97.4 F (36.3 C)-98.6 F (37 C)] 97.4 F (36.3 C) (04/09 0758) Pulse Rate:  [28-83] 45 (04/09 0758) Resp:  [18-20] 18 (04/09 0758) BP: (90-101)/(72-80) 91/77 (04/09 0758) SpO2:  [92 %-97 %] 92 % (04/09 0758) Weight:  [99.8 kg] 99.8 kg (04/09 0700)  Weight change: -1.361 kg Filed Weights   04/07/19 1523 04/08/19 0343 04/09/19 0700  Weight: 99 kg 100.3 kg 99.8 kg    Intake/Output: I/O last 3 completed shifts: In: 1269.7 [P.O.:1180; I.V.:89.7] Out: 1535 [XAJOI:7867]   Intake/Output this shift:  Total I/O In: 206.5 [I.V.:206.5] Out: -   Physical Exam: General: NAD,   Head: Normocephalic, atraumatic. Moist oral mucosal membranes  Eyes: Anicteric, PERRL  Neck: Supple, trachea midline  Lungs:  Bilateral crackles  Heart: Regular rate and rhythm  Abdomen:  Soft, nontender,   Extremities:  peripheral edema.  Neurologic: Nonfocal, moving all four extremities  Skin: No lesions       Basic Metabolic Panel: Recent Labs  Lab 04/07/19 1327 04/08/19 0756 04/09/19 0747  NA 139 138 139  K 4.8 4.5 3.8  CL 107 107 104  CO2 19* 21* 24  GLUCOSE 169* 134* 108*  BUN 28* 33* 37*  CREATININE 2.10* 2.54* 2.42*  CALCIUM 9.7 9.8 9.2    Liver Function Tests: Recent Labs  Lab 04/07/19 1327  AST 16  ALT 15  ALKPHOS 88  BILITOT 1.3*  PROT 7.1  ALBUMIN 3.9   Recent Labs  Lab 04/08/19 0756  LIPASE 17   No results for input(s): AMMONIA in the last 168 hours.  CBC: Recent Labs  Lab 04/07/19 1327  WBC 9.8  NEUTROABS 7.7  HGB 13.9  HCT 43.1  MCV 89.2  PLT 301    Cardiac Enzymes: Recent Labs  Lab 04/07/19 1327  TROPONINI 0.03*    BNP: Invalid input(s): POCBNP  CBG: No results for input(s): GLUCAP in the last 168 hours.  Microbiology: Results for orders placed or performed in visit on  03/17/19  Novel Coronavirus, NAA ([facility])     Status: None   Collection Time: 03/17/19  9:51 AM  Result Value Ref Range Status   SARS-CoV-2, NAA Not Detected Not Detected Final    Comment: Testing was performed using the cobas(R) SARS-CoV-2 test. This test was developed and its performance characteristics determined by [employer]. This test has not been FDA cleared or approved. This test has been authorized by FDA under an Emergency Use Authorization (EUA). This test is only authorized for the duration of time the declaration that circumstances exist justifying the authorization of the emergency use of in vitro diagnostic tests for detection of SARS-CoV-2 virus and/or diagnosis of COVID-19 infection under section 564(b)(1) of the Act, 21 U.S.C. 672CNO-7(S)(9), unless the authorization is terminated or revoked sooner.     Coagulation Studies: No results for input(s): LABPROT, INR in the last 72 hours.  Urinalysis: No results for input(s): COLORURINE, LABSPEC, PHURINE, GLUCOSEU, HGBUR, BILIRUBINUR, KETONESUR, PROTEINUR, UROBILINOGEN, NITRITE, LEUKOCYTESUR in the last 72 hours.  Invalid input(s): APPERANCEUR    Imaging: Dg Chest Portable 1 View  Result Date: 04/07/2019 CLINICAL DATA:  Chest pain and shortness of breath for 1-2 days EXAM: PORTABLE CHEST 1 VIEW COMPARISON:  02/23/2019 FINDINGS: Cardiac shadow is enlarged but stable. Increased vascular congestion is noted when compare with the  prior exam with interstitial densities consistent with edema. No focal confluent infiltrate is seen. No sizable effusion is noted. IMPRESSION: Increasing vascular congestion with interstitial edema. Electronically Signed   By: Inez Catalina M.D.   On: 04/07/2019 13:42     Medications:   . furosemide (LASIX) infusion 6 mg/hr (04/09/19 0731)   . allopurinol  100 mg Oral Daily  . amiodarone  400 mg Oral Daily  . aspirin EC  81 mg Oral Daily  . atorvastatin  80 mg Oral q1800  .  carvedilol  3.125 mg Oral BID WC  . clopidogrel  75 mg Oral Daily  . enoxaparin (LOVENOX) injection  40 mg Subcutaneous Q24H  . feeding supplement (ENSURE ENLIVE)  237 mL Oral BID BM  . methimazole  40 mg Oral Daily  . mometasone-formoterol  2 puff Inhalation BID  . multivitamin with minerals  1 tablet Oral Daily  . pantoprazole  40 mg Oral BID  . potassium chloride  20 mEq Oral BID  . predniSONE  20 mg Oral Q breakfast  . senna-docusate  1 tablet Oral Daily  . sodium chloride flush  3 mL Intravenous Q12H  . spironolactone  25 mg Oral Daily  . tiotropium  1 capsule Inhalation Daily   acetaminophen **OR** acetaminophen, albuterol, benzonatate, bismuth subsalicylate, cyclobenzaprine, diclofenac sodium, guaiFENesin, nitroGLYCERIN, ondansetron **OR** ondansetron (ZOFRAN) IV, polyethylene glycol  Assessment/ Plan:  [name] is a 53 y.o. black male with chronic systolic heart failure ejection fraction 10-15%, prior history of cardiac arrest, hyperlipidemia, nephrolithiasis, hypertension, history of TIA, was admitted to [facility] on4/07/2019 for acute exacerbation of systolic congestive heart failure, EF 10%  1. Acute renal failure: secondary to acute cardiorenal syndrome 2. Chronic kidney disease stage III baseline creatinine 1.85, GFR of 47 on 01/24/19. Bland urine.  3. Acute on Chronic systolic heart failure. 4.  Metabolic acidosis 5. Hyperthyroidism 6. Hypertension  Plan - Continue furosemide gtt.   - Restart entresto  - Continue spironolactone - Continue holding metolazone.    LOS: 2 [name] 4/9/202011:01 AM

## 2019-04-10 LAB — CBC
HCT: 40.5 % (ref 39.0–52.0)
Hemoglobin: 12.9 g/dL — ABNORMAL LOW (ref 13.0–17.0)
MCH: 28.5 pg (ref 26.0–34.0)
MCHC: 31.9 g/dL (ref 30.0–36.0)
MCV: 89.4 fL (ref 80.0–100.0)
Platelets: 243 10*3/uL (ref 150–400)
RBC: 4.53 MIL/uL (ref 4.22–5.81)
RDW: 18 % — ABNORMAL HIGH (ref 11.5–15.5)
WBC: 10.7 10*3/uL — ABNORMAL HIGH (ref 4.0–10.5)
nRBC: 0 % (ref 0.0–0.2)

## 2019-04-10 LAB — BASIC METABOLIC PANEL
Anion gap: 12 (ref 5–15)
BUN: 50 mg/dL — ABNORMAL HIGH (ref 6–20)
CO2: 24 mmol/L (ref 22–32)
Calcium: 9.1 mg/dL (ref 8.9–10.3)
Chloride: 103 mmol/L (ref 98–111)
Creatinine, Ser: 2.38 mg/dL — ABNORMAL HIGH (ref 0.61–1.24)
GFR calc Af Amer: 35 mL/min — ABNORMAL LOW (ref >60)
GFR calc non Af Amer: 30 mL/min — ABNORMAL LOW (ref >60)
Glucose, Bld: 104 mg/dL — ABNORMAL HIGH (ref 70–99)
Potassium: 3.8 mmol/L (ref 3.5–5.1)
Sodium: 139 mmol/L (ref 135–145)

## 2019-04-10 MED ORDER — HYDROCODONE-ACETAMINOPHEN 7.5-325 MG PO TABS
1.0000 | ORAL_TABLET | Freq: Four times a day (QID) | ORAL | Status: DC | PRN
  Administered 2019-04-10 – 2019-04-11 (×3): 1 via ORAL
  Filled 2019-04-10 (×3): qty 1

## 2019-04-10 NOTE — Progress Notes (Signed)
MD notified. Pts BP is low. MD orders to hold this dose of coreg and give all other medication. I will continue to assess.

## 2019-04-10 NOTE — Progress Notes (Signed)
Dr. Hulen Skains about patient having Trigeminy PVC's. No new orders given, will continue to monitor.

## 2019-04-10 NOTE — Progress Notes (Signed)
[facility] [place] NAME: Blaize Epple    MR#:  [id number]  DATE OF BIRTH:  07-05-1966  SUBJECTIVE:  CHIEF COMPLAINT:   Chief Complaint  Patient presents with  . Shortness of Breath    Shortness of breath is improving but still feels chest congestion.  Shortness of breath persists.  REVIEW OF SYSTEMS:    Review of Systems  Constitutional: Negative for chills and fever.  HENT: Negative for sore throat.   Eyes: Negative for blurred vision, double vision and pain.  Respiratory: Positive for shortness of breath. Negative for cough, hemoptysis and wheezing.   Cardiovascular: Negative for chest pain, palpitations, orthopnea and leg swelling.  Gastrointestinal: Positive for nausea and vomiting. Negative for abdominal pain, constipation, diarrhea and heartburn.  Genitourinary: Negative for dysuria and hematuria.  Musculoskeletal: Negative for back pain and joint pain.  Skin: Negative for rash.  Neurological: Negative for sensory change, speech change, focal weakness and headaches.  Endo/Heme/Allergies: Does not bruise/bleed easily.  Psychiatric/Behavioral: Negative for depression. The patient is not nervous/anxious.     DRUG ALLERGIES:   Allergies  Allergen Reactions  . Lisinopril Cough    VITALS:  Blood pressure 96/82, pulse 89, temperature 98.5 F (36.9 C), temperature source Oral, resp. rate 20, height 6\' 1"  (1.854 m), weight 97.7 kg, SpO2 98 %.  PHYSICAL EXAMINATION:   Physical Exam  GENERAL:  53 y.o.-year-old patient lying in the bed with no acute distress.  EYES: Pupils equal, round, reactive to light and accommodation. No scleral icterus. Extraocular muscles intact.  HEENT: Head atraumatic, normocephalic. Oropharynx and nasopharynx clear.  NECK:  Supple, no jugular venous distention. No thyroid enlargement, no tenderness.  LUNGS bibasilar crackles.  Good air entry. CARDIOVASCULAR: S1, S2 normal. No murmurs, rubs, or  gallops.  ABDOMEN: Soft, nontender, nondistended. Bowel sounds present. No organomegaly or mass.  EXTREMITIES: No cyanosis, clubbing or edema b/l.    NEUROLOGIC: Cranial nerves II through XII are intact. No focal Motor or sensory deficits b/l.   PSYCHIATRIC: The patient is alert and oriented x 3.  SKIN: No obvious rash, lesion, or ulcer.   LABORATORY PANEL:   CBC Recent Labs  Lab 04/07/19 1327  WBC 9.8  HGB 13.9  HCT 43.1  PLT 301   ------------------------------------------------------------------------------------------------------------------ Chemistries  Recent Labs  Lab 04/07/19 1327  04/10/19 0520  NA 139   < > 139  K 4.8   < > 3.8  CL 107   < > 103  CO2 19*   < > 24  GLUCOSE 169*   < > 104*  BUN 28*   < > 50*  CREATININE 2.10*   < > 2.38*  CALCIUM 9.7   < > 9.1  AST 16  --   --   ALT 15  --   --   ALKPHOS 88  --   --   BILITOT 1.3*  --   --    < > = values in this interval not displayed.   ------------------------------------------------------------------------------------------------------------------  Cardiac Enzymes Recent Labs  Lab 04/07/19 1327  TROPONINI 0.03*   ------------------------------------------------------------------------------------------------------------------  RADIOLOGY:  No results found.   ASSESSMENT AND PLAN:   *Acute on chronic systolic congestive heart failure with ejection fraction of 10% Patient is on Coreg and Entresto. Delene Loll was stopped due to acute kidney injury Lasix drip dose decreased yesterday. Requested cardiology consultation.  Seen by Dr. Stephani Police Patient still has crackles on examination and chest congestion.  With his ejection  fraction of 10% we will continue Lasix drip 1 more day.  Likely discharge in 1 to 2 days.  *Acute kidney injury over CKD stage III-due to cardiorenal syndrome Creatinine slowly trending down.  Continue Lasix drip. Appreciate nephrology input.  *Hypertension.   Coreg held  today due to low normal blood pressure.  Entresto on hold due to acute kidney injury.  DVT prophylaxis with renally dosed Lovenox  All the records are reviewed and case discussed with Care Management/Social Workerr. Management plans discussed with the patient, family and they are in agreement.  CODE STATUS: FULL CODE  DVT Prophylaxis: SCDs  TOTAL TIME TAKING CARE OF THIS PATIENT: 35 minutes.   POSSIBLE D/C IN 2-3 DAYS, DEPENDING ON CLINICAL CONDITION.  Leia Alf Maisen Schmit M.D on 04/10/2019 at 9:49 AM  Between 7am to 6pm - Pager - [phone]  After 6pm go to [email or web address] - password [email or web address] [facility]  Office  [phone]  CC: Primary care physician; Valerie Roys, DO  Note: This dictation was prepared with Dragon dictation along with smaller phrase technology. Any transcriptional errors that result from this process are unintentional.

## 2019-04-10 NOTE — Consult Note (Signed)
[facility] FAILURE PHARMACIST COUNSELING NOTE  Guideline-Directed Medical Therapy/Evidence Based Medicine  ACE/ARB/ARNI: Delene Loll 49-51 - held while admitted Beta Blocker: Coreg 6.25 mg BID changed to 3.125 mg BID while admitted.  Aldosterone Antagonist: spironolactone 25 mg daily Diuretic: torsemide 40 mg (20 mg tablets x 2) twice daily.   ADHERENCE ASSESSMENT Patient states he takes his medication on a timely basis and does not miss doses. If the patient feels he overloaded he takes an extra torsemide pill for a few days until his weight is back to "normal." He also uses metolazone as needed, which really makes him "go". Patient understand the importance of adherence to GDMT to improve quality of life. Explained to patient to still call the clinic before adjusting your medications so they can follow his fluid status appropriately.    SUBJECTIVE   HPI: Stephen [name]  is a 53 y.o. male with a known history of chronic systolic CHF with ejection fraction of 10%, AICD, hypertension, CKD stage III presents to the emergency room complaining of worsening shortness of breath over the past 4 days.  Patient was seen by primary care physician and started on antibiotics for possible pneumonia.  No fever.  White blood cell count is normal.  Chest x-ray showing pulmonary edema. In the past patient has not diuresed well with IV Lasix boluses and progress to acute kidney injury.  Patient was started on lasix gtt.   Past Medical History:  Diagnosis Date  . AICD (automatic cardioverter/defibrillator) present   . AKI (acute kidney injury) ([place]) 12/24/2017  . Arrhythmia   . Arthritis    "lower back, knees" (06/10/2018)  . Cardiac arrest ([place]) 12/23/2017   Brief V-fib arrest  . Chest pain 09/08/2017  . CHF (congestive heart failure) ([facility])    nonischemic cardiomyopathy, EF 25%  . Gout    "on daily RX" (06/10/2018)  . Headache    "q couple months" (06/10/2018)  . High cholesterol    . History of kidney stones   . Hypertension   . Influenza A 12/24/2017  . NICM (nonischemic cardiomyopathy) ([place])   . Pneumonia 12/20/2017  . TIA (transient ischemic attack) 06/21/2016   "still affects my memory a little bit" (06/10/2018)     OBJECTIVE   Vital signs: HR 70-80s, BP 90s-100/60-80, weight (pounds) 215 LB (down 5 LB from 4/8) ECHO: Date 02/24/2019, EF 10%,   BMP Latest Ref Rng & Units 04/10/2019 04/09/2019 04/08/2019  Glucose 70 - 99 mg/dL 104(H) 108(H) 134(H)  BUN 6 - 20 mg/dL 50(H) 37(H) 33(H)  Creatinine 0.61 - 1.24 mg/dL 2.38(H) 2.42(H) 2.54(H)  BUN/Creat Ratio 9 - 20 - - -  Sodium 135 - 145 mmol/L 139 139 138  Potassium 3.5 - 5.1 mmol/L 3.8 3.8 4.5  Chloride 98 - 111 mmol/L 103 104 107  CO2 22 - 32 mmol/L 24 24 21(L)  Calcium 8.9 - 10.3 mg/dL 9.1 9.2 9.8    ASSESSMENT/PLAN Patient is eager to get well and not have to come back to the hospital. Patient states he is adherent to the therapy. I think the main issue is dose consistency with his diuretics due to self adjusting. I explained the team will transition him to an oral agent to help with the fluid and that he should call the clinic or his doctor about any weight gain above 3 lb so they can track it and make better decisions with the doses. Patient's blood pressure has been soft and his ARNI is being  held. Recommended the patient measure his blood pressure daily so his medications can be appropriately adjusted by his providers. I feel patient has a good knowledge base of dietary requirements for this patient population.   Patient takes torsemide 40 mg twice daily prior to admission. Patient may need to be on a higher dose of the diuretic at discharge (torsemide 60-80 mg). Creatinine is trending down. Potassium is stable     Time spent: 35 minutes  Oswald Hillock, Pharm.D, BCPS. Clinical Pharmacist 04/10/2019 2:40 PM    Current Facility-Administered Medications:  .  acetaminophen (TYLENOL) tablet 650 mg, 650  mg, Oral, Q6H PRN, 650 mg at 04/09/19 2113 **OR** acetaminophen (TYLENOL) suppository 650 mg, 650 mg, Rectal, Q6H PRN, Sudini, Srikar, MD .  albuterol (PROVENTIL) (2.5 MG/3ML) 0.083% nebulizer solution 2.5 mg, 2.5 mg, Nebulization, Q2H PRN, Sudini, Srikar, MD .  allopurinol (ZYLOPRIM) tablet 100 mg, 100 mg, Oral, Daily, Sudini, Srikar, MD, 100 mg at 04/10/19 0945 .  amiodarone (PACERONE) tablet 400 mg, 400 mg, Oral, Daily, Sudini, Srikar, MD, 400 mg at 04/10/19 0945 .  aspirin EC tablet 81 mg, 81 mg, Oral, Daily, Sudini, Srikar, MD, 81 mg at 04/10/19 0944 .  atorvastatin (LIPITOR) tablet 80 mg, 80 mg, Oral, q1800, Hillary Bow, MD, 80 mg at 04/09/19 1911 .  benzonatate (TESSALON) capsule 200 mg, 200 mg, Oral, BID PRN, Hillary Bow, MD, 200 mg at 04/07/19 1746 .  bismuth subsalicylate (PEPTO BISMOL) 262 MG/15ML suspension 30 mL, 30 mL, Oral, Q4H PRN, Sudini, Srikar, MD, 30 mL at 04/08/19 0900 .  carvedilol (COREG) tablet 3.125 mg, 3.125 mg, Oral, BID WC, Sudini, Srikar, MD .  clopidogrel (PLAVIX) tablet 75 mg, 75 mg, Oral, Daily, Sudini, Srikar, MD, 75 mg at 04/10/19 0945 .  cyclobenzaprine (FLEXERIL) tablet 5 mg, 5 mg, Oral, TID PRN, Sudini, Srikar, MD .  diclofenac sodium (VOLTAREN) 1 % transdermal gel 2 g, 2 g, Topical, TID PRN, Sudini, Srikar, MD .  enoxaparin (LOVENOX) injection 40 mg, 40 mg, Subcutaneous, Q24H, Sudini, Srikar, MD, 40 mg at 04/09/19 2112 .  feeding supplement (ENSURE ENLIVE) (ENSURE ENLIVE) liquid 237 mL, 237 mL, Oral, BID BM, Sudini, Srikar, MD, 237 mL at 04/10/19 1438 .  furosemide (LASIX) 250 mg in dextrose 5 % 250 mL (1 mg/mL) infusion, 6 mg/hr, Intravenous, Continuous, Kolluru, Sarath, MD, Last Rate: 6 mL/hr at 04/09/19 1608, 6 mg/hr at 04/09/19 1608 .  guaiFENesin (ROBITUSSIN) 100 MG/5ML solution 100 mg, 5 mL, Oral, Q4H PRN, Hillary Bow, MD, 100 mg at 04/10/19 1437 .  methimazole (TAPAZOLE) tablet 40 mg, 40 mg, Oral, Daily, Sudini, Srikar, MD, 40 mg at 04/10/19  0946 .  mometasone-formoterol (DULERA) 200-5 MCG/ACT inhaler 2 puff, 2 puff, Inhalation, BID, Hillary Bow, MD, 2 puff at 04/10/19 0947 .  multivitamin with minerals tablet 1 tablet, 1 tablet, Oral, Daily, Sudini, Srikar, MD, 1 tablet at 04/10/19 0945 .  nitroGLYCERIN (NITROSTAT) SL tablet 0.4 mg, 0.4 mg, Sublingual, Q5 min PRN, Sudini, Srikar, MD .  ondansetron (ZOFRAN) tablet 4 mg, 4 mg, Oral, Q6H PRN **OR** ondansetron (ZOFRAN) injection 4 mg, 4 mg, Intravenous, Q6H PRN, Sudini, Srikar, MD, 4 mg at 04/08/19 1114 .  pantoprazole (PROTONIX) EC tablet 40 mg, 40 mg, Oral, BID, Sudini, Srikar, MD, 40 mg at 04/10/19 0944 .  polyethylene glycol (MIRALAX / GLYCOLAX) packet 17 g, 17 g, Oral, Daily PRN, Sudini, Srikar, MD .  potassium chloride SA (K-DUR,KLOR-CON) CR tablet 20 mEq, 20 mEq, Oral, BID, Sudini, Srikar, MD, 20 mEq  at 04/10/19 0944 .  predniSONE (DELTASONE) tablet 20 mg, 20 mg, Oral, Q breakfast, Sudini, Srikar, MD, 20 mg at 04/10/19 0944 .  senna-docusate (Senokot-S) tablet 1 tablet, 1 tablet, Oral, Daily, Sudini, Srikar, MD .  sodium chloride flush (NS) 0.9 % injection 3 mL, 3 mL, Intravenous, Q12H, Sudini, Srikar, MD, 3 mL at 04/10/19 1439 .  spironolactone (ALDACTONE) tablet 25 mg, 25 mg, Oral, Daily, Kolluru, Sarath, MD, 25 mg at 04/10/19 0945 .  tiotropium (SPIRIVA) inhalation capsule ([facility] use ONLY) 18 mcg, 1 capsule, Inhalation, Daily, Sudini, Srikar, MD, 18 mcg at 04/10/19 0950   COUNSELING POINTS/CLINICAL PEARLS Carvedilol (Goal: weight less than 85 kg is 25 mg BID, weight greater than 85 kg is 50 mg BID)  Patient should avoid activities requiring coordination until drug effects are realized, as drug may cause  dizziness.  This drug may cause diarrhea, nausea, vomiting, arthralgia, back pain, myalgia, headache, vision disorder, erectile dysfunction, reduced libido, or fatigue.  Instruct patient to report signs/symptoms of adverse cardiovascular effects such as hypotension  (especially in elderly patients), arrhythmias, syncope, palpitations, angina, or edema.  Drug may mask symptoms of hypoglycemia. Advise diabetic patients to carefully monitor blood sugar levels.  Patient should take drug with food.  Advise patient against sudden discontinuation of drug. Furosemide  Drug causes sun-sensitivity. Advise patient to use  sunscreen and avoid tanning beds. Patient should avoid activities requiring coordination until drug effects are realized, as drug may cause dizziness, vertigo, or blurred vision. This drug may cause hyperglycemia, hyperuricemia, constipation, diarrhea, loss of appetite, nausea, vomiting, purpuric disorder, cramps, spasticity, asthenia, headache, paresthesia, or scaling eczema. Instruct patient to report unusual bleeding/bruising or signs/symptoms of hypotension, infection, pancreatitis, or ototoxicity (tinnitus, hearing impairment). Advise patient to report signs/symptoms of a severe skin reactions (flu-like symptoms, spreading red rash, or skin/mucous membrane blistering) or erythema multiforme. Instruct patient to eat high-potassium foods during drug  therapy, as directed by healthcare  professional.  Patient should not drink alcohol while taking this drug. Torsemide  Side effects may include excessive urination.  Tell patient to report symptoms of ototoxicity.  Instruct patient to report lightheadedness or syncope.  Warn patient to avoid use of nonprescription NSAID  products without first discussing it with  their healthcare provider. Spironolactone  Warn patient to report dehydration, hypotension, or  symptoms of worsening renal function.  Counsel male patient to report gynecomastia.  Side effects may include diarrhea, nausea, vomiting, abdominal cramping, fever, leg cramps, lethargy, mental confusion, decreased libido, irregular menses, and rash. Suspension: Tell patient to take drug consistently with  respect to food, either before or after a   meal.  Advise patient to avoid potassium supplements and foods containing high levels of potassium, including salt substitutes.   DRUGS TO AVOID IN HEART FAILURE  Drug or Class Mechanism  Analgesics . NSAIDs . COX-2 inhibitors . Glucocorticoids  Sodium and water retention, increased systemic vascular resistance, decreased response to diuretics   Diabetes Medications . Metformin . Thiazolidinediones o Rosiglitazone (Avandia) o Pioglitazone (Actos) . DPP4 Inhibitors o Saxagliptin (Onglyza) o Sitagliptin (Januvia)   Lactic acidosis Possible calcium channel blockade   Unknown  Antiarrhythmics . Class I  o Flecainide o Disopyramide . Class III o Sotalol . Other o Dronedarone  Negative inotrope, proarrhythmic   Proarrhythmic, beta blockade  Negative inotrope  Antihypertensives . Alpha Blockers o Doxazosin . Calcium Channel Blockers o Diltiazem o Verapamil o Nifedipine . Central Alpha Adrenergics o Moxonidine . Peripheral Vasodilators o Minoxidil  Increases renin  and aldosterone  Negative inotrope    Possible sympathetic withdrawal  Unknown  Anti-infective . Itraconazole . Amphotericin B  Negative inotrope Unknown  Hematologic . Anagrelide . Cilostazol   Possible inhibition of PD IV Inhibition of PD III causing arrhythmias  Neurologic/Psychiatric . Stimulants . Anti-Seizure Drugs o Carbamazepine o Pregabalin . Antidepressants o Tricyclics o Citalopram . Parkinsons o Bromocriptine o Pergolide o Pramipexole . Antipsychotics o Clozapine . Antimigraine o Ergotamine o Methysergide . Appetite suppressants . Bipolar o Lithium  Peripheral alpha and beta agonist activity  Negative inotrope and chronotrope Calcium channel blockade  Negative inotrope, proarrhythmic Dose-dependent QT prolongation  Excessive serotonin activity/valvular damage Excessive serotonin activity/valvular damage Unknown  IgE mediated hypersensitivy, calcium  channel blockade  Excessive serotonin activity/valvular damage Excessive serotonin activity/valvular damage Valvular damage  Direct myofibrillar degeneration, adrenergic stimulation  Antimalarials . Chloroquine . Hydroxychloroquine Intracellular inhibition of lysosomal enzymes  Urologic Agents . Alpha Blockers o Doxazosin o Prazosin o Tamsulosin o Terazosin  Increased renin and aldosterone  Adapted from Page RL, et al. "Drugs That May Cause or Exacerbate Heart Failure: A Scientific Statement from the [place]." Circulation 2016; 272:Z36-U44. DOI: 10.1161/CIR.0000000000000426   MEDICATION ADHERENCES TIPS AND STRATEGIES 1. Taking medication as prescribed improves patient outcomes in heart failure (reduces hospitalizations, improves symptoms, increases survival) 2. Side effects of medications can be managed by decreasing doses, switching agents, stopping drugs, or adding additional therapy. Please let someone in the [facility] know if you have having bothersome side effects so we can modify your regimen. Do not alter your medication regimen without talking to [place].  3. Medication reminders can help patients remember to take drugs on time. If you are missing or forgetting doses you can try linking behaviors, using pill boxes, or an electronic reminder like an alarm on your phone or an app. Some people can also get automated phone calls as medication reminders.

## 2019-04-10 NOTE — Progress Notes (Signed)
[facility]  ROUNDING NOTE   Subjective:   UOP 1357mL - furosemide gtt 6mg /hr  Continues to complain of shortness of breath.   Objective:  Vital signs in last 24 hours:  Temp:  [97.6 F (36.4 C)-98.5 F (36.9 C)] 98.5 F (36.9 C) (04/10 0720) Pulse Rate:  [68-89] 89 (04/10 0720) Resp:  [18-20] 20 (04/10 0720) BP: (95-102)/(80-88) 96/82 (04/10 0720) SpO2:  [93 %-99 %] 98 % (04/10 0435) Weight:  [97.7 kg] 97.7 kg (04/10 0436)  Weight change: -2.091 kg Filed Weights   04/08/19 0343 04/09/19 0700 04/10/19 0436  Weight: 100.3 kg 99.8 kg 97.7 kg    Intake/Output: I/O last 3 completed shifts: In: 546.5 [P.O.:340; I.V.:206.5] Out: 1825 [Urine:1825]   Intake/Output this shift:  Total I/O In: -  Out: 1050 [Urine:1050]  Physical Exam: General: NAD,   Head: Normocephalic, atraumatic. Moist oral mucosal membranes  Eyes: Anicteric, PERRL  Neck: Supple, trachea midline  Lungs:  Bilateral crackles  Heart: Regular rate and rhythm  Abdomen:  Soft, nontender,   Extremities:  peripheral edema.  Neurologic: Nonfocal, moving all four extremities  Skin: No lesions       Basic Metabolic Panel: Recent Labs  Lab 04/07/19 1327 04/08/19 0756 04/09/19 0747 04/10/19 0520  NA 139 138 139 139  K 4.8 4.5 3.8 3.8  CL 107 107 104 103  CO2 19* 21* 24 24  GLUCOSE 169* 134* 108* 104*  BUN 28* 33* 37* 50*  CREATININE 2.10* 2.54* 2.42* 2.38*  CALCIUM 9.7 9.8 9.2 9.1    Liver Function Tests: Recent Labs  Lab 04/07/19 1327  AST 16  ALT 15  ALKPHOS 88  BILITOT 1.3*  PROT 7.1  ALBUMIN 3.9   Recent Labs  Lab 04/08/19 0756  LIPASE 17   No results for input(s): AMMONIA in the last 168 hours.  CBC: Recent Labs  Lab 04/07/19 1327 04/10/19 1207  WBC 9.8 10.7*  NEUTROABS 7.7  --   HGB 13.9 12.9*  HCT 43.1 40.5  MCV 89.2 89.4  PLT 301 243    Cardiac Enzymes: Recent Labs  Lab 04/07/19 1327  TROPONINI 0.03*    BNP: Invalid input(s):  POCBNP  CBG: No results for input(s): GLUCAP in the last 168 hours.  Microbiology: Results for orders placed or performed in visit on 03/17/19  Novel Coronavirus, NAA ([facility])     Status: None   Collection Time: 03/17/19  9:51 AM  Result Value Ref Range Status   SARS-CoV-2, NAA Not Detected Not Detected Final    Comment: Testing was performed using the cobas(R) SARS-CoV-2 test. This test was developed and its performance characteristics determined by [employer]. This test has not been FDA cleared or approved. This test has been authorized by FDA under an Emergency Use Authorization (EUA). This test is only authorized for the duration of time the declaration that circumstances exist justifying the authorization of the emergency use of in vitro diagnostic tests for detection of SARS-CoV-2 virus and/or diagnosis of COVID-19 infection under section 564(b)(1) of the Act, 21 U.S.C. 263FHL-4(T)(6), unless the authorization is terminated or revoked sooner.     Coagulation Studies: No results for input(s): LABPROT, INR in the last 72 hours.  Urinalysis: No results for input(s): COLORURINE, LABSPEC, PHURINE, GLUCOSEU, HGBUR, BILIRUBINUR, KETONESUR, PROTEINUR, UROBILINOGEN, NITRITE, LEUKOCYTESUR in the last 72 hours.  Invalid input(s): APPERANCEUR    Imaging: No results found.   Medications:   . furosemide (LASIX) infusion 6 mg/hr (04/09/19 1608)   . allopurinol  100 mg Oral Daily  . amiodarone  400 mg Oral Daily  . aspirin EC  81 mg Oral Daily  . atorvastatin  80 mg Oral q1800  . carvedilol  3.125 mg Oral BID WC  . clopidogrel  75 mg Oral Daily  . enoxaparin (LOVENOX) injection  40 mg Subcutaneous Q24H  . feeding supplement (ENSURE ENLIVE)  237 mL Oral BID BM  . methimazole  40 mg Oral Daily  . mometasone-formoterol  2 puff Inhalation BID  . multivitamin with minerals  1 tablet Oral Daily  . pantoprazole  40 mg Oral BID  . potassium chloride  20 mEq Oral BID  .  predniSONE  20 mg Oral Q breakfast  . senna-docusate  1 tablet Oral Daily  . sodium chloride flush  3 mL Intravenous Q12H  . spironolactone  25 mg Oral Daily  . tiotropium  1 capsule Inhalation Daily   acetaminophen **OR** acetaminophen, albuterol, benzonatate, bismuth subsalicylate, cyclobenzaprine, diclofenac sodium, guaiFENesin, HYDROcodone-acetaminophen, nitroGLYCERIN, ondansetron **OR** ondansetron (ZOFRAN) IV, polyethylene glycol  Assessment/ Plan:  [name] is a 53 y.o. black male with chronic systolic heart failure ejection fraction 10-15%, prior history of cardiac arrest, hyperlipidemia, nephrolithiasis, hypertension, history of TIA, was admitted to [facility] on4/07/2019 for acute exacerbation of systolic congestive heart failure, EF 10%  1. Acute renal failure: secondary to acute cardiorenal syndrome 2. Chronic kidney disease stage III baseline creatinine 1.85, GFR of 47 on 01/24/19. Bland urine.  3. Acute on Chronic systolic heart failure. 4.  Metabolic acidosis 5. Hyperthyroidism 6. Hypertension  Plan - Continue furosemide gtt.   - Currnetly holding entresto  - Continue spironolactone - Continue holding metolazone.  - Monitor potassium    LOS: 3 Stephen [name] 4/10/20202:44 PM

## 2019-04-11 DIAGNOSIS — R000 Tachycardia, unspecified: Secondary | ICD-10-CM

## 2019-04-11 DIAGNOSIS — I440 Atrioventricular block, first degree: Secondary | ICD-10-CM

## 2019-04-11 LAB — BASIC METABOLIC PANEL
Anion gap: 11 (ref 5–15)
BUN: 56 mg/dL — ABNORMAL HIGH (ref 6–20)
CO2: 26 mmol/L (ref 22–32)
Calcium: 9.2 mg/dL (ref 8.9–10.3)
Chloride: 101 mmol/L (ref 98–111)
Creatinine, Ser: 2.2 mg/dL — ABNORMAL HIGH (ref 0.61–1.24)
GFR calc Af Amer: 38 mL/min — ABNORMAL LOW (ref >60)
GFR calc non Af Amer: 33 mL/min — ABNORMAL LOW (ref >60)
Glucose, Bld: 125 mg/dL — ABNORMAL HIGH (ref 70–99)
Potassium: 3.9 mmol/L (ref 3.5–5.1)
Sodium: 138 mmol/L (ref 135–145)

## 2019-04-11 MED ORDER — TORSEMIDE 20 MG PO TABS
40.0000 mg | ORAL_TABLET | Freq: Two times a day (BID) | ORAL | Status: DC
  Administered 2019-04-11: 40 mg via ORAL
  Filled 2019-04-11: qty 2

## 2019-04-11 MED ORDER — SACUBITRIL-VALSARTAN 24-26 MG PO TABS
1.0000 | ORAL_TABLET | Freq: Two times a day (BID) | ORAL | Status: DC
  Administered 2019-04-11: 1 via ORAL
  Filled 2019-04-11: qty 1

## 2019-04-11 NOTE — Progress Notes (Signed)
[facility] Encounter Note  Patient: Stephen [name] / Admit Date: 04/07/2019 / Date of Encounter: 04/11/2019, 12:44 PM   Subjective: Patient claims that overall since hospital admission he is slowly improving with his chest pressure and severe shortness of breath as well as bloating in the abdomen.  These are all primary symptoms of his congestive heart failure that he has had off and on over the last several months.  His multiple admissions to the hospital have shown no evidence of myocardial infarction or progression of coronary atherosclerosis but are mainly secondary to heart failure and fluid balances.  The patient has recently had several days of Lasix drip which he claims that this helped quite a bit with weight loss and improvements of shortness of breath.  Is at expected based on nephrology that he will be able to transition to torsemide.  As an outpatient there was some push to potentially help with advanced heart failure therapy at [facility] which was not able to be pursued yet.  He has had multiple EKGs throughout his last several months and hospitalizations all showing similar changes including normal sinus rhythm with first-degree AV block and some tachycardia at 100 bpm with left axis deviation and left bundle branch block.  These are not significantly different and or require further intervention at this time.  The patient does appear to be improving slowly  Review of Systems: Positive for: Shortness of breath abdominal distention Negative for: Vision change, hearing change, syncope, dizziness, nausea, vomiting,diarrhea, bloody stool, stomach pain, cough, congestion, diaphoresis, urinary frequency, urinary pain,skin lesions, skin rashes Others previously listed  Objective: Telemetry: Normal sinus rhythm with bundle branch block Physical Exam: Blood pressure (!) 127/105, pulse 67, temperature 98.5 F (36.9 C), temperature source Oral, resp. rate (!) 24, height 6\' 1"   (1.854 m), weight 101.4 kg, SpO2 90 %. Body mass index is 29.49 kg/m.  As per previous exam per primdoc   Intake/Output Summary (Last 24 hours) at 04/11/2019 1244 Last data filed at 04/11/2019 1610 Gross per 24 hour  Intake -  Output 2200 ml  Net -2200 ml    Inpatient Medications:  . allopurinol  100 mg Oral Daily  . amiodarone  400 mg Oral Daily  . aspirin EC  81 mg Oral Daily  . atorvastatin  80 mg Oral q1800  . carvedilol  3.125 mg Oral BID WC  . clopidogrel  75 mg Oral Daily  . enoxaparin (LOVENOX) injection  40 mg Subcutaneous Q24H  . feeding supplement (ENSURE ENLIVE)  237 mL Oral BID BM  . methimazole  40 mg Oral Daily  . mometasone-formoterol  2 puff Inhalation BID  . multivitamin with minerals  1 tablet Oral Daily  . pantoprazole  40 mg Oral BID  . potassium chloride  20 mEq Oral BID  . predniSONE  20 mg Oral Q breakfast  . sacubitril-valsartan  1 tablet Oral BID  . senna-docusate  1 tablet Oral Daily  . sodium chloride flush  3 mL Intravenous Q12H  . spironolactone  25 mg Oral Daily  . tiotropium  1 capsule Inhalation Daily  . torsemide  40 mg Oral BID   Infusions:   Labs: Recent Labs    04/10/19 0520 04/11/19 0554  NA 139 138  K 3.8 3.9  CL 103 101  CO2 24 26  GLUCOSE 104* 125*  BUN 50* 56*  CREATININE 2.38* 2.20*  CALCIUM 9.1 9.2   No results for input(s): AST, ALT, ALKPHOS, BILITOT, PROT, ALBUMIN  in the last 72 hours. Recent Labs    04/10/19 1207  WBC 10.7*  HGB 12.9*  HCT 40.5  MCV 89.4  PLT 243   No results for input(s): CKTOTAL, CKMB, TROPONINI in the last 72 hours. Invalid input(s): POCBNP No results for input(s): HGBA1C in the last 72 hours.   Weights: Filed Weights   04/09/19 0700 04/10/19 0436 04/10/19 1930  Weight: 99.8 kg 97.7 kg 101.4 kg     Radiology/Studies:  Dg Chest Portable 1 View  Result Date: 04/07/2019 CLINICAL DATA:  Chest pain and shortness of breath for 1-2 days EXAM: PORTABLE CHEST 1 VIEW COMPARISON:   02/23/2019 FINDINGS: Cardiac shadow is enlarged but stable. Increased vascular congestion is noted when compare with the prior exam with interstitial densities consistent with edema. No focal confluent infiltrate is seen. No sizable effusion is noted. IMPRESSION: Increasing vascular congestion with interstitial edema. Electronically Signed   By: Inez Catalina M.D.   On: 04/07/2019 13:42     Assessment and Recommendation  52 y.o. male with known severe dilated cardiomyopathy with ejection fraction of 10 to 15% unchanged over the last many months on appropriate medication management maximized with recurrent acute on chronic systolic dysfunction heart failure without evidence of myocardial infarction or significant new anginal symptoms or EKG changes requiring further intervention at this time other than continued medication management for prevention measures and kidney failure 1.  Continue transition of Lasix drip to torsemide 2.  No further cardiac intervention at this time of chest pressure resolving and shortness of breath resolving and or abnormal EKG left bundle branch block on changed at this time 3.  Continue amiodarone for history of ventricular tachycardia 4.  Reinstatement of Entresto and spironolactone as able for cardiomyopathy and LV systolic dysfunction 5.  High intensity cholesterol therapy 6.  Begin ambulation and follow for improvements of symptoms and possible discharge to home with follow-up for higher intensive heart failure outpatient care as able  Signed, Serafina Royals M.D. FACC

## 2019-04-11 NOTE — Progress Notes (Addendum)
Pt c/o heart palpitations.  Patient is tachycardic, with what appears to be wide QRS rhythm with BBB. HR 100 -110. EKG obtained. BP stable. O2 sat on RA 89%. Started on [facility]. Dr. Brigitte Pulse notified.  He reviewed the rhythm and EKG.  Not unsimilar to admission EKG.  Dr. Nehemiah Massed to see the patient today.

## 2019-04-11 NOTE — Progress Notes (Signed)
[place] at [place] NAME: Stephen [name]    MR#:  [id number]  DATE OF BIRTH:  10/06/1966  SUBJECTIVE:  CHIEF COMPLAINT:   Chief Complaint  Patient presents with  . Shortness of Breath  Shortness of breath +, RN reported some EKG changes and abd pain. Patient sitting up on side of bed REVIEW OF SYSTEMS:    Review of Systems  Constitutional: Negative for chills and fever.  HENT: Negative for sore throat.   Eyes: Negative for blurred vision, double vision and pain.  Respiratory: Positive for shortness of breath. Negative for cough, hemoptysis and wheezing.   Cardiovascular: Negative for chest pain, palpitations, orthopnea and leg swelling.  Gastrointestinal: Positive for nausea and vomiting. Negative for abdominal pain, constipation, diarrhea and heartburn.  Genitourinary: Negative for dysuria and hematuria.  Musculoskeletal: Negative for back pain and joint pain.  Skin: Negative for rash.  Neurological: Negative for sensory change, speech change, focal weakness and headaches.  Endo/Heme/Allergies: Does not bruise/bleed easily.  Psychiatric/Behavioral: Negative for depression. The patient is not nervous/anxious.    DRUG ALLERGIES:   Allergies  Allergen Reactions  . Lisinopril Cough    VITALS:  Blood pressure (!) 127/105, pulse 67, temperature 98.5 F (36.9 C), temperature source Oral, resp. rate (!) 24, height 6\' 1"  (1.854 m), weight 101.4 kg, SpO2 90 %.  PHYSICAL EXAMINATION:   Physical Exam  GENERAL:  53 y.o.-year-old patient lying in the bed with no acute distress.  EYES: Pupils equal, round, reactive to light and accommodation. No scleral icterus. Extraocular muscles intact.  HEENT: Head atraumatic, normocephalic. Oropharynx and nasopharynx clear.  NECK:  Supple, no jugular venous distention. No thyroid enlargement, no tenderness.  LUNGS bibasilar crackles.  Good air entry. CARDIOVASCULAR: S1, S2 normal. No murmurs, rubs, or  gallops.  ABDOMEN: Soft, nontender, nondistended. Bowel sounds present. No organomegaly or mass.  EXTREMITIES: No cyanosis, clubbing or edema b/l.    NEUROLOGIC: Cranial nerves II through XII are intact. No focal Motor or sensory deficits b/l.   PSYCHIATRIC: The patient is alert and oriented x 3.  SKIN: No obvious rash, lesion, or ulcer.   LABORATORY PANEL:   CBC Recent Labs  Lab 04/10/19 1207  WBC 10.7*  HGB 12.9*  HCT 40.5  PLT 243   ------------------------------------------------------------------------------------------------------------------ Chemistries  Recent Labs  Lab 04/07/19 1327  04/11/19 0554  NA 139   < > 138  K 4.8   < > 3.9  CL 107   < > 101  CO2 19*   < > 26  GLUCOSE 169*   < > 125*  BUN 28*   < > 56*  CREATININE 2.10*   < > 2.20*  CALCIUM 9.7   < > 9.2  AST 16  --   --   ALT 15  --   --   ALKPHOS 88  --   --   BILITOT 1.3*  --   --    < > = values in this interval not displayed.   ------------------------------------------------------------------------------------------------------------------  Cardiac Enzymes Recent Labs  Lab 04/07/19 1327  TROPONINI 0.03*   ------------------------------------------------------------------------------------------------------------------  RADIOLOGY:  No results found.   ASSESSMENT AND PLAN:   *Acute on chronic systolic congestive heart failure with ejection fraction of 10% - continue Coreg & restart Entresto today, was stopped due to acute kidney injury - Seen by Dr. Nehemiah Massed - ejection fraction of 10% - on heart transplant work up at [facility]  *Acute kidney injury over CKD  stage III-due to cardiorenal syndrome Creatinine slowly trending down.   -Nephro stoppingfurosemide gtt. Transition to PO torsemide 40mg  BID today.    - Restarting Entresto today  - Continue spironolactone  *Hypertension.   On coreg, entresto  DVT prophylaxis with renally dosed Lovenox    All the records are reviewed  and case discussed with Care Management/Social Workerr. Management plans discussed with the patient, nursing and they are in agreement.  CODE STATUS: FULL CODE  DVT Prophylaxis: SCDs  TOTAL TIME TAKING CARE OF THIS PATIENT: 35 minutes.   POSSIBLE D/C IN 1-2 DAYS, DEPENDING ON CLINICAL CONDITION.  Max Sane M.D on 04/11/2019 at 1:42 PM  Between 7am to 6pm - Pager - [phone]  After 6pm go to [email or web address] - password [email or web address] [place] [facility]  Office  [phone]  CC: Primary care physician; Valerie Roys, DO  Note: This dictation was prepared with Dragon dictation along with smaller phrase technology. Any transcriptional errors that result from this process are unintentional.

## 2019-04-11 NOTE — Progress Notes (Signed)
[facility]  ROUNDING NOTE   Subjective:   UOP 2840mL - furosemide gtt 6mg /hr  Creatinine 2.2 (2.38)  Complains of shortness of breath and nausea this morning.   Objective:  Vital signs in last 24 hours:  Temp:  [97.3 F (36.3 C)-98.5 F (36.9 C)] 98.5 F (36.9 C) (04/11 0800) Pulse Rate:  [67-79] 67 (04/11 0855) Resp:  [18-24] 24 (04/11 0848) BP: (105-133)/(82-106) 127/105 (04/11 0855) SpO2:  [90 %-100 %] 90 % (04/11 0855) Weight:  [101.4 kg] 101.4 kg (04/10 1930)  Weight change: 3.679 kg Filed Weights   04/09/19 0700 04/10/19 0436 04/10/19 1930  Weight: 99.8 kg 97.7 kg 101.4 kg    Intake/Output: I/O last 3 completed shifts: In: -  Out: 3575 [Urine:3575]   Intake/Output this shift:  No intake/output data recorded.  Physical Exam: General: NAD,   Head: Normocephalic, atraumatic. Moist oral mucosal membranes  Eyes: Anicteric, PERRL  Neck: Supple, trachea midline  Lungs:  Left sided wheezing  Heart: Regular rate and rhythm  Abdomen:  Soft, nontender,   Extremities: no peripheral edema.  Neurologic: Nonfocal, moving all four extremities  Skin: No lesions       Basic Metabolic Panel: Recent Labs  Lab 04/07/19 1327 04/08/19 0756 04/09/19 0747 04/10/19 0520 04/11/19 0554  NA 139 138 139 139 138  K 4.8 4.5 3.8 3.8 3.9  CL 107 107 104 103 101  CO2 19* 21* 24 24 26   GLUCOSE 169* 134* 108* 104* 125*  BUN 28* 33* 37* 50* 56*  CREATININE 2.10* 2.54* 2.42* 2.38* 2.20*  CALCIUM 9.7 9.8 9.2 9.1 9.2    Liver Function Tests: Recent Labs  Lab 04/07/19 1327  AST 16  ALT 15  ALKPHOS 88  BILITOT 1.3*  PROT 7.1  ALBUMIN 3.9   Recent Labs  Lab 04/08/19 0756  LIPASE 17   No results for input(s): AMMONIA in the last 168 hours.  CBC: Recent Labs  Lab 04/07/19 1327 04/10/19 1207  WBC 9.8 10.7*  NEUTROABS 7.7  --   HGB 13.9 12.9*  HCT 43.1 40.5  MCV 89.2 89.4  PLT 301 243    Cardiac Enzymes: Recent Labs  Lab 04/07/19 1327   TROPONINI 0.03*    BNP: Invalid input(s): POCBNP  CBG: No results for input(s): GLUCAP in the last 168 hours.  Microbiology: Results for orders placed or performed in visit on 03/17/19  Novel Coronavirus, NAA ([facility])     Status: None   Collection Time: 03/17/19  9:51 AM  Result Value Ref Range Status   SARS-CoV-2, NAA Not Detected Not Detected Final    Comment: Testing was performed using the cobas(R) SARS-CoV-2 test. This test was developed and its performance characteristics determined by [employer]. This test has not been FDA cleared or approved. This test has been authorized by FDA under an Emergency Use Authorization (EUA). This test is only authorized for the duration of time the declaration that circumstances exist justifying the authorization of the emergency use of in vitro diagnostic tests for detection of SARS-CoV-2 virus and/or diagnosis of COVID-19 infection under section 564(b)(1) of the Act, 21 U.S.C. 094BSJ-6(G)(8), unless the authorization is terminated or revoked sooner.     Coagulation Studies: No results for input(s): LABPROT, INR in the last 72 hours.  Urinalysis: No results for input(s): COLORURINE, LABSPEC, PHURINE, GLUCOSEU, HGBUR, BILIRUBINUR, KETONESUR, PROTEINUR, UROBILINOGEN, NITRITE, LEUKOCYTESUR in the last 72 hours.  Invalid input(s): APPERANCEUR    Imaging: No results found.   Medications:   .  furosemide (LASIX) infusion 6 mg/hr (04/10/19 1641)   . allopurinol  100 mg Oral Daily  . amiodarone  400 mg Oral Daily  . aspirin EC  81 mg Oral Daily  . atorvastatin  80 mg Oral q1800  . carvedilol  3.125 mg Oral BID WC  . clopidogrel  75 mg Oral Daily  . enoxaparin (LOVENOX) injection  40 mg Subcutaneous Q24H  . feeding supplement (ENSURE ENLIVE)  237 mL Oral BID BM  . methimazole  40 mg Oral Daily  . mometasone-formoterol  2 puff Inhalation BID  . multivitamin with minerals  1 tablet Oral Daily  . pantoprazole  40 mg Oral  BID  . potassium chloride  20 mEq Oral BID  . predniSONE  20 mg Oral Q breakfast  . senna-docusate  1 tablet Oral Daily  . sodium chloride flush  3 mL Intravenous Q12H  . spironolactone  25 mg Oral Daily  . tiotropium  1 capsule Inhalation Daily   acetaminophen **OR** acetaminophen, albuterol, benzonatate, bismuth subsalicylate, cyclobenzaprine, diclofenac sodium, guaiFENesin, HYDROcodone-acetaminophen, nitroGLYCERIN, ondansetron **OR** ondansetron (ZOFRAN) IV, polyethylene glycol  Assessment/ Plan:  [name] is a 53 y.o. black male with chronic systolic heart failure ejection fraction 10-15%, prior history of cardiac arrest, hyperlipidemia, nephrolithiasis, hypertension, history of TIA, was admitted to [facility] on[facility]/07/2019 for acute exacerbation of systolic congestive heart failure, EF 10%  1. Acute renal failure: secondary to acute cardiorenal syndrome 2. Chronic kidney disease stage III baseline creatinine 1.85, GFR of 47 on 01/24/19. Bland urine.  3. Acute on Chronic systolic heart failure. 4.  Metabolic acidosis 5. Hyperthyroidism 6. Hypertension  Plan - Discontinue furosemide gtt. Transition to PO torsemide 40mg  BID.    - Restart Entresto today  - Continue spironolactone - Continue holding metolazone.  - Monitor potassium    LOS: 4 Krizia Flight 4/11/202011:50 AM

## 2019-04-12 LAB — RENAL FUNCTION PANEL
Albumin: 3.2 g/dL — ABNORMAL LOW (ref 3.5–5.0)
Anion gap: 14 (ref 5–15)
BUN: 54 mg/dL — ABNORMAL HIGH (ref 6–20)
CO2: 26 mmol/L (ref 22–32)
Calcium: 9 mg/dL (ref 8.9–10.3)
Chloride: 100 mmol/L (ref 98–111)
Creatinine, Ser: 1.98 mg/dL — ABNORMAL HIGH (ref 0.61–1.24)
GFR calc Af Amer: 43 mL/min — ABNORMAL LOW (ref >60)
GFR calc non Af Amer: 37 mL/min — ABNORMAL LOW (ref >60)
Glucose, Bld: 112 mg/dL — ABNORMAL HIGH (ref 70–99)
Phosphorus: 3.6 mg/dL (ref 2.5–4.6)
Potassium: 4 mmol/L (ref 3.5–5.1)
Sodium: 140 mmol/L (ref 135–145)

## 2019-04-12 MED ORDER — TORSEMIDE 20 MG PO TABS
20.0000 mg | ORAL_TABLET | Freq: Two times a day (BID) | ORAL | Status: DC
  Administered 2019-04-12 – 2019-04-15 (×7): 20 mg via ORAL
  Filled 2019-04-12 (×7): qty 1

## 2019-04-12 MED ORDER — SPIRONOLACTONE 25 MG PO TABS
12.5000 mg | ORAL_TABLET | Freq: Every day | ORAL | Status: DC
  Administered 2019-04-12 – 2019-04-15 (×4): 12.5 mg via ORAL
  Filled 2019-04-12 (×2): qty 1
  Filled 2019-04-12 (×4): qty 0.5
  Filled 2019-04-12: qty 1

## 2019-04-12 MED ORDER — SACUBITRIL-VALSARTAN 24-26 MG PO TABS: 1.0000 | ORAL_TABLET | Freq: Every day | ORAL | Status: DC

## 2019-04-12 MED ORDER — CARVEDILOL 3.125 MG PO TABS
3.1250 mg | ORAL_TABLET | Freq: Every day | ORAL | Status: DC
  Administered 2019-04-13 – 2019-04-15 (×3): 3.125 mg via ORAL
  Filled 2019-04-12 (×3): qty 1

## 2019-04-12 NOTE — Progress Notes (Signed)
[place] at [place] NAME: Stephen [name]    MR#:  [id number]  DATE OF BIRTH:  1966-10-20  SUBJECTIVE:  CHIEF COMPLAINT:   Chief Complaint  Patient presents with  . Shortness of Breath  Shortness of breath +, c/o abd pain. hypotensive REVIEW OF SYSTEMS:    Review of Systems  Constitutional: Positive for malaise/fatigue. Negative for chills and fever.  HENT: Negative for sore throat.   Eyes: Negative for blurred vision, double vision and pain.  Respiratory: Positive for shortness of breath. Negative for cough, hemoptysis and wheezing.   Cardiovascular: Negative for chest pain, palpitations, orthopnea and leg swelling.  Gastrointestinal: Positive for nausea. Negative for abdominal pain, constipation, diarrhea and heartburn.  Genitourinary: Negative for dysuria and hematuria.  Musculoskeletal: Negative for back pain and joint pain.  Skin: Negative for rash.  Neurological: Negative for sensory change, speech change, focal weakness and headaches.  Endo/Heme/Allergies: Does not bruise/bleed easily.  Psychiatric/Behavioral: Negative for depression. The patient is not nervous/anxious.    DRUG ALLERGIES:   Allergies  Allergen Reactions  . Lisinopril Cough    VITALS:  Blood pressure 96/70, pulse 62, temperature (!) 97.3 F (36.3 C), temperature source Oral, resp. rate 18, height 6\' 1"  (1.854 m), weight 99.7 kg, SpO2 96 %.  PHYSICAL EXAMINATION:   Physical Exam  GENERAL:  53 y.o.-year-old patient lying in the bed with no acute distress.  EYES: Pupils equal, round, reactive to light and accommodation. No scleral icterus. Extraocular muscles intact.  HEENT: Head atraumatic, normocephalic. Oropharynx and nasopharynx clear.  NECK:  Supple, no jugular venous distention. No thyroid enlargement, no tenderness.  LUNGS bibasilar crackles.  Good air entry. CARDIOVASCULAR: S1, S2 normal. No murmurs, rubs, or gallops.  ABDOMEN: Soft, nontender,  nondistended. Bowel sounds present. No organomegaly or mass.  EXTREMITIES: No cyanosis, clubbing or edema b/l.    NEUROLOGIC: Cranial nerves II through XII are intact. No focal Motor or sensory deficits b/l.   PSYCHIATRIC: The patient is alert and oriented x 3.  SKIN: No obvious rash, lesion, or ulcer.  LABORATORY PANEL:   CBC Recent Labs  Lab 04/10/19 1207  WBC 10.7*  HGB 12.9*  HCT 40.5  PLT 243   ------------------------------------------------------------------------------------------------------------------ Chemistries  Recent Labs  Lab 04/07/19 1327  04/12/19 0511  NA 139   < > 140  K 4.8   < > 4.0  CL 107   < > 100  CO2 19*   < > 26  GLUCOSE 169*   < > 112*  BUN 28*   < > 54*  CREATININE 2.10*   < > 1.98*  CALCIUM 9.7   < > 9.0  AST 16  --   --   ALT 15  --   --   ALKPHOS 88  --   --   BILITOT 1.3*  --   --    < > = values in this interval not displayed.   ------------------------------------------------------------------------------------------------------------------  Cardiac Enzymes Recent Labs  Lab 04/07/19 1327  TROPONINI 0.03*   ------------------------------------------------------------------------------------------------------------------  RADIOLOGY:  No results found.   ASSESSMENT AND PLAN:  *Acute on chronic systolic congestive heart failure with ejection fraction of 10% - Reduce dose of Coreg & Entresto as BP low - ejection fraction of 10% - on heart transplant work up at [employer] [facility]  *Acute kidney injury over CKD stage III-due to cardiorenal syndrome Creatinine slowly trending down.   -Offfurosemide gtt. cut torsemide 20mg  BID today as BP soft.    -  reduce Entresto dose today  - Reduce dose of spironolactone  * Hypotension with h/o Hypertension.   - reduce dose of coreg, entresto, torsemide & spironolactone  * Hyperlipidemia: on lipitor   DVT prophylaxis with renally dosed Lovenox    All the records are reviewed and case  discussed with Care Management/Social Workerr. Management plans discussed with the patient, nursing and they are in agreement.  CODE STATUS: FULL CODE  DVT Prophylaxis: SCDs  TOTAL TIME TAKING CARE OF THIS PATIENT: 35 minutes.   POSSIBLE D/C IN 1-2 DAYS, DEPENDING ON CLINICAL CONDITION.  Max Sane M.D on 04/12/2019 at 8:18 AM  Between 7am to 6pm - Pager - [phone]  After 6pm go to [email or web address] - password [email or web address] [facility]  Office  [phone]  CC: Primary care physician; Valerie Roys, DO  Note: This dictation was prepared with Dragon dictation along with smaller phrase technology. Any transcriptional errors that result from this process are unintentional.

## 2019-04-12 NOTE — Progress Notes (Signed)
[facility]  ROUNDING NOTE   Subjective:   Transitioned from furosemide gtt to PO torsemide.   Started on Entresto yesterday.   Creatinine 1.98 (2.2) (2.38)  Objective:  Vital signs in last 24 hours:  Temp:  [97.3 F (36.3 C)-98.6 F (37 C)] 98.6 F (37 C) (04/12 0824) Pulse Rate:  [25-71] 64 (04/12 0824) Resp:  [16-18] 18 (04/12 0420) BP: (79-119)/(60-90) 119/79 (04/12 0824) SpO2:  [95 %-100 %] 99 % (04/12 0824) Weight:  [99.7 kg] 99.7 kg (04/12 0420)  Weight change: -1.678 kg Filed Weights   04/10/19 0436 04/10/19 1930 04/12/19 0420  Weight: 97.7 kg 101.4 kg 99.7 kg    Intake/Output: I/O last 3 completed shifts: In: 290 [P.O.:240; I.V.:50] Out: 2575 [Urine:2575]   Intake/Output this shift:  Total I/O In: -  Out: 300 [Urine:300]  Physical Exam: General: NAD,   Head: Normocephalic, atraumatic. Moist oral mucosal membranes  Eyes: Anicteric, PERRL  Neck: Supple, trachea midline  Lungs:  Left sided wheezing  Heart: Regular rate and rhythm  Abdomen:  Soft, nontender,   Extremities: no peripheral edema.  Neurologic: Nonfocal, moving all four extremities  Skin: No lesions       Basic Metabolic Panel: Recent Labs  Lab 04/08/19 0756 04/09/19 0747 04/10/19 0520 04/11/19 0554 04/12/19 0511  NA 138 139 139 138 140  K 4.5 3.8 3.8 3.9 4.0  CL 107 104 103 101 100  CO2 21* 24 24 26 26   GLUCOSE 134* 108* 104* 125* 112*  BUN 33* 37* 50* 56* 54*  CREATININE 2.54* 2.42* 2.38* 2.20* 1.98*  CALCIUM 9.8 9.2 9.1 9.2 9.0  PHOS  --   --   --   --  3.6    Liver Function Tests: Recent Labs  Lab 04/07/19 1327 04/12/19 0511  AST 16  --   ALT 15  --   ALKPHOS 88  --   BILITOT 1.3*  --   PROT 7.1  --   ALBUMIN 3.9 3.2*   Recent Labs  Lab 04/08/19 0756  LIPASE 17   No results for input(s): AMMONIA in the last 168 hours.  CBC: Recent Labs  Lab 04/07/19 1327 04/10/19 1207  WBC 9.8 10.7*  NEUTROABS 7.7  --   HGB 13.9 12.9*  HCT 43.1 40.5   MCV 89.2 89.4  PLT 301 243    Cardiac Enzymes: Recent Labs  Lab 04/07/19 1327  TROPONINI 0.03*    BNP: Invalid input(s): POCBNP  CBG: No results for input(s): GLUCAP in the last 168 hours.  Microbiology: Results for orders placed or performed in visit on 03/17/19  Novel Coronavirus, NAA ([facility])     Status: None   Collection Time: 03/17/19  9:51 AM  Result Value Ref Range Status   SARS-CoV-2, NAA Not Detected Not Detected Final    Comment: Testing was performed using the cobas(R) SARS-CoV-2 test. This test was developed and its performance characteristics determined by [employer]. This test has not been FDA cleared or approved. This test has been authorized by FDA under an Emergency Use Authorization (EUA). This test is only authorized for the duration of time the declaration that circumstances exist justifying the authorization of the emergency use of in vitro diagnostic tests for detection of SARS-CoV-2 virus and/or diagnosis of COVID-19 infection under section 564(b)(1) of the Act, 21 U.S.C. 532DJM-4(Q)(6), unless the authorization is terminated or revoked sooner.     Coagulation Studies: No results for input(s): LABPROT, INR in the last 72 hours.  Urinalysis:  No results for input(s): COLORURINE, LABSPEC, [place], GLUCOSEU, HGBUR, BILIRUBINUR, KETONESUR, PROTEINUR, UROBILINOGEN, NITRITE, LEUKOCYTESUR in the last 72 hours.  Invalid input(s): APPERANCEUR    Imaging: No results found.   Medications:    . allopurinol  100 mg Oral Daily  . amiodarone  400 mg Oral Daily  . aspirin EC  81 mg Oral Daily  . atorvastatin  80 mg Oral q1800  . [START ON 04/13/2019] carvedilol  3.125 mg Oral Daily  . clopidogrel  75 mg Oral Daily  . enoxaparin (LOVENOX) injection  40 mg Subcutaneous Q24H  . feeding supplement (ENSURE ENLIVE)  237 mL Oral BID BM  . methimazole  40 mg Oral Daily  . mometasone-formoterol  2 puff Inhalation BID  . multivitamin with minerals   1 tablet Oral Daily  . pantoprazole  40 mg Oral BID  . potassium chloride  20 mEq Oral BID  . predniSONE  20 mg Oral Q breakfast  . sacubitril-valsartan  1 tablet Oral Daily  . senna-docusate  1 tablet Oral Daily  . sodium chloride flush  3 mL Intravenous Q12H  . spironolactone  12.5 mg Oral Daily  . tiotropium  1 capsule Inhalation Daily  . torsemide  20 mg Oral BID   acetaminophen **OR** acetaminophen, albuterol, benzonatate, bismuth subsalicylate, cyclobenzaprine, diclofenac sodium, guaiFENesin, HYDROcodone-acetaminophen, nitroGLYCERIN, ondansetron **OR** ondansetron (ZOFRAN) IV, polyethylene glycol  Assessment/ Plan:  Stephen [name] is a 53 y.o. black male with chronic systolic heart failure ejection fraction 10-15%, prior history of cardiac arrest, hyperlipidemia, nephrolithiasis, hypertension, history of TIA, was admitted to [facility] on4/07/2019 for acute exacerbation of systolic congestive heart failure, EF 10%  1. Acute renal failure: secondary to acute cardiorenal syndrome 2. Chronic kidney disease stage III baseline creatinine 1.85, GFR of 47 on 01/24/19. Bland urine.  3. Acute on Chronic systolic heart failure. 4.  Metabolic acidosis 5. Hyperthyroidism 6. Hypertension  Plan - Transitioned to PO torsemide 40mg  BID.    - Discontinue Entresto  - Continue spironolactone - Continue holding metolazone.  - Monitor potassium  - May consider low dose ARB since unable to tolerate Entresto.    LOS: 5 Stephen [name] 4/12/202011:38 AM

## 2019-04-12 NOTE — Plan of Care (Signed)
  Problem: Cardiac: Goal: Ability to achieve and maintain adequate cardiopulmonary perfusion will improve Outcome: Progressing   Problem: Clinical Measurements: Goal: Ability to maintain clinical measurements within normal limits will improve Outcome: Progressing Goal: Respiratory complications will improve Outcome: Progressing   Problem: Pain Managment: Goal: General experience of comfort will improve Outcome: Progressing

## 2019-04-13 LAB — BASIC METABOLIC PANEL
Anion gap: 10 (ref 5–15)
BUN: 57 mg/dL — ABNORMAL HIGH (ref 6–20)
CO2: 24 mmol/L (ref 22–32)
Calcium: 8.8 mg/dL — ABNORMAL LOW (ref 8.9–10.3)
Chloride: 104 mmol/L (ref 98–111)
Creatinine, Ser: 2.01 mg/dL — ABNORMAL HIGH (ref 0.61–1.24)
GFR calc Af Amer: 43 mL/min — ABNORMAL LOW (ref >60)
GFR calc non Af Amer: 37 mL/min — ABNORMAL LOW (ref >60)
Glucose, Bld: 117 mg/dL — ABNORMAL HIGH (ref 70–99)
Potassium: 4 mmol/L (ref 3.5–5.1)
Sodium: 138 mmol/L (ref 135–145)

## 2019-04-13 LAB — CBC
HCT: 39.4 % (ref 39.0–52.0)
Hemoglobin: 12.7 g/dL — ABNORMAL LOW (ref 13.0–17.0)
MCH: 28.6 pg (ref 26.0–34.0)
MCHC: 32.2 g/dL (ref 30.0–36.0)
MCV: 88.7 fL (ref 80.0–100.0)
Platelets: 240 10*3/uL (ref 150–400)
RBC: 4.44 MIL/uL (ref 4.22–5.81)
RDW: 17.7 % — ABNORMAL HIGH (ref 11.5–15.5)
WBC: 8.8 10*3/uL (ref 4.0–10.5)
nRBC: 0 % (ref 0.0–0.2)

## 2019-04-13 NOTE — TOC Progression Note (Signed)
Transition of Care [facility]) - Progression Note    Patient Details  Name: Stephen [name] MRN: [id number] Date of Birth: 1966/03/14  Transition of Care [facility]) CM/SW Contact  Elza Rafter, RN Phone Number: 04/13/2019, 11:57 AM  Clinical Narrative:   Tiajuana Amass to discharge-medication adjustments.  IV lasix changed to PO torsemide on 4-12.  DC Entresto as patient is unable to tolerate.      Expected Discharge Plan: Home/Self Care Barriers to Discharge: Continued Medical Work up  Expected Discharge Plan and Services Expected Discharge Plan: Home/Self Care   Discharge Planning Services: CM Consult   Living arrangements for the past 2 months: Single Family Home                           Social Determinants of Health (SDOH) Interventions    Readmission Risk Interventions Readmission Risk Prevention Plan 04/08/2019  Medication Review ([place]) Complete  PCP or Specialist appointment within 3-5 days of discharge Complete  HRI or [place] Complete  SW Recovery Care/Counseling Consult Patient refused  Palliative Care Screening Not Green Not Applicable  Some recent data might be hidden

## 2019-04-13 NOTE — Progress Notes (Signed)
[facility]  ROUNDING NOTE   Subjective:  Renal parameters about the same today. BUN currently 57 with a creatinine of 2.01. Overall status is improved as compared to admission.   Objective:  Vital signs in last 24 hours:  Temp:  [97.4 F (36.3 C)-98.2 F (36.8 C)] 97.9 F (36.6 C) (04/13 0901) Pulse Rate:  [68-96] 96 (04/13 0901) Resp:  [18] 18 (04/13 0348) BP: (99-115)/(76-96) 115/96 (04/13 0901) SpO2:  [95 %-99 %] 95 % (04/13 0901)  Weight change:  Filed Weights   04/10/19 0436 04/10/19 1930 04/12/19 0420  Weight: 97.7 kg 101.4 kg 99.7 kg    Intake/Output: I/O last 3 completed shifts: In: 240 [P.O.:240] Out: 1350 [Urine:1350]   Intake/Output this shift:  No intake/output data recorded.  Physical Exam: General: NAD  Head: Normocephalic, atraumatic. Moist oral mucosal membranes  Eyes: Anicteri  Neck: Supple, trachea midline  Lungs:  CTAB, normal effort  Heart: Regular rate and rhythm  Abdomen:  Soft, nontender,   Extremities: no peripheral edema.  Neurologic: Nonfocal, moving all four extremities  Skin: No lesions       Basic Metabolic Panel: Recent Labs  Lab 04/09/19 0747 04/10/19 0520 04/11/19 0554 04/12/19 0511 04/13/19 0419  NA 139 139 138 140 138  K 3.8 3.8 3.9 4.0 4.0  CL 104 103 101 100 104  CO2 24 24 26 26 24   GLUCOSE 108* 104* 125* 112* 117*  BUN 37* 50* 56* 54* 57*  CREATININE 2.42* 2.38* 2.20* 1.98* 2.01*  CALCIUM 9.2 9.1 9.2 9.0 8.8*  PHOS  --   --   --  3.6  --     Liver Function Tests: Recent Labs  Lab 04/07/19 1327 04/12/19 0511  AST 16  --   ALT 15  --   ALKPHOS 88  --   BILITOT 1.3*  --   PROT 7.1  --   ALBUMIN 3.9 3.2*   Recent Labs  Lab 04/08/19 0756  LIPASE 17   No results for input(s): AMMONIA in the last 168 hours.  CBC: Recent Labs  Lab 04/07/19 1327 04/10/19 1207 04/13/19 0419  WBC 9.8 10.7* 8.8  NEUTROABS 7.7  --   --   HGB 13.9 12.9* 12.7*  HCT 43.1 40.5 39.4  MCV 89.2 89.4 88.7   PLT 301 243 240    Cardiac Enzymes: Recent Labs  Lab 04/07/19 1327  TROPONINI 0.03*    BNP: Invalid input(s): POCBNP  CBG: No results for input(s): GLUCAP in the last 168 hours.  Microbiology: Results for orders placed or performed in visit on 03/17/19  Novel Coronavirus, NAA ([facility])     Status: None   Collection Time: 03/17/19  9:51 AM  Result Value Ref Range Status   SARS-CoV-2, NAA Not Detected Not Detected Final    Comment: Testing was performed using the cobas(R) SARS-CoV-2 test. This test was developed and its performance characteristics determined by [employer]. This test has not been FDA cleared or approved. This test has been authorized by FDA under an Emergency Use Authorization (EUA). This test is only authorized for the duration of time the declaration that circumstances exist justifying the authorization of the emergency use of in vitro diagnostic tests for detection of SARS-CoV-2 virus and/or diagnosis of COVID-19 infection under section 564(b)(1) of the Act, 21 U.S.C. 147WGN-5(A)(2), unless the authorization is terminated or revoked sooner.     Coagulation Studies: No results for input(s): LABPROT, INR in the last 72 hours.  Urinalysis: No results for  input(s): COLORURINE, LABSPEC, PHURINE, GLUCOSEU, HGBUR, BILIRUBINUR, KETONESUR, PROTEINUR, UROBILINOGEN, NITRITE, LEUKOCYTESUR in the last 72 hours.  Invalid input(s): APPERANCEUR    Imaging: No results found.   Medications:    . allopurinol  100 mg Oral Daily  . amiodarone  400 mg Oral Daily  . aspirin EC  81 mg Oral Daily  . atorvastatin  80 mg Oral q1800  . carvedilol  3.125 mg Oral Daily  . clopidogrel  75 mg Oral Daily  . enoxaparin (LOVENOX) injection  40 mg Subcutaneous Q24H  . feeding supplement (ENSURE ENLIVE)  237 mL Oral BID BM  . methimazole  40 mg Oral Daily  . mometasone-formoterol  2 puff Inhalation BID  . multivitamin with minerals  1 tablet Oral Daily  .  pantoprazole  40 mg Oral BID  . potassium chloride  20 mEq Oral BID  . predniSONE  20 mg Oral Q breakfast  . senna-docusate  1 tablet Oral Daily  . sodium chloride flush  3 mL Intravenous Q12H  . spironolactone  12.5 mg Oral Daily  . tiotropium  1 capsule Inhalation Daily  . torsemide  20 mg Oral BID   acetaminophen **OR** acetaminophen, albuterol, benzonatate, bismuth subsalicylate, cyclobenzaprine, diclofenac sodium, guaiFENesin, HYDROcodone-acetaminophen, nitroGLYCERIN, ondansetron **OR** ondansetron (ZOFRAN) IV, polyethylene glycol  Assessment/ Plan:  [name] is a 53 y.o. black male with chronic systolic heart failure ejection fraction 10-15%, prior history of cardiac arrest, hyperlipidemia, nephrolithiasis, hypertension, history of TIA, was admitted to [facility] on4/07/2019 for acute exacerbation of systolic congestive heart failure, EF 10%  1. Acute renal failure: secondary to acute cardiorenal syndrome 2. Chronic kidney disease stage III baseline creatinine 1.85, GFR of 47 on 01/24/19. Bland urine.  3. Acute on Chronic systolic heart failure. 4.  Metabolic acidosis 5. Hyperthyroidism 6. Hypertension  Plan - Patient did not tolerate Entresto very well.  Continue the patient on torsemide, spironolactone, and carvedilol.  Consider adding ARB once renal function has completely stabilized.  Further plan as per cardiology and hospitalist.   LOS: 6 Stephen [name] 4/13/202012:26 PM

## 2019-04-13 NOTE — Progress Notes (Signed)
[facility] Encounter Note  Patient: Stephen [name] / Admit Date: 04/07/2019 / Date of Encounter: 04/13/2019, 1:25 PM   Subjective: Patient claims that he is done relatively well with his transition to torsemide with no evidence of significant worse edema or abdominal bloating.  Patient did not tolerate Entresto very well and therefore would consider the possibility of angiotensin receptor blocker as an outpatient.  We have talked at length for the possibility of further long-term intervention and/or consultation with advanced heart failure clinic at [facility] for transplant if necessary.  There is been no evidence of significant tachycardia or other EKG changes since last few days  Review of Systems: Positive for: Shortness of breath Negative for: Vision change, hearing change, syncope, dizziness, nausea, vomiting,diarrhea, bloody stool, stomach pain, cough, congestion, diaphoresis, urinary frequency, urinary pain,skin lesions, skin rashes Others previously listed  Objective: Telemetry: Normal sinus rhythm with bundle branch block Physical Exam: Blood pressure (!) 115/96, pulse 96, temperature 97.9 F (36.6 C), resp. rate 18, height 6\' 1"  (1.854 m), weight 99.7 kg, SpO2 95 %. Body mass index is 29 kg/m. General: Well developed, well nourished, in no acute distress. Head: Normocephalic, atraumatic, sclera non-icteric, no xanthomas, nares are without discharge. Neck: No apparent masses Lungs: Normal respirations with no wheezes, no rhonchi, no rales , no crackles   Heart: Regular rate and rhythm, normal S1 S2, no murmur, no rub, no gallop, PMI is normal size and placement, carotid upstroke normal without bruit, jugular venous pressure normal Abdomen: Soft, non-tender,  distended with normoactive bowel sounds. No hepatosplenomegaly. Abdominal aorta is normal size without bruit Extremities: Trace edema, no clubbing, no cyanosis, no ulcers,  Peripheral: 2+ radial, 2+ femoral,  2+ dorsal pedal pulses Neuro: Alert and oriented. Moves all extremities spontaneously. Psych:  Responds to questions appropriately with a normal affect.   Intake/Output Summary (Last 24 hours) at 04/13/2019 1325 Last data filed at 04/12/2019 2228 Gross per 24 hour  Intake -  Output 375 ml  Net -375 ml    Inpatient Medications:  . allopurinol  100 mg Oral Daily  . amiodarone  400 mg Oral Daily  . aspirin EC  81 mg Oral Daily  . atorvastatin  80 mg Oral q1800  . carvedilol  3.125 mg Oral Daily  . clopidogrel  75 mg Oral Daily  . enoxaparin (LOVENOX) injection  40 mg Subcutaneous Q24H  . feeding supplement (ENSURE ENLIVE)  237 mL Oral BID BM  . methimazole  40 mg Oral Daily  . mometasone-formoterol  2 puff Inhalation BID  . multivitamin with minerals  1 tablet Oral Daily  . pantoprazole  40 mg Oral BID  . potassium chloride  20 mEq Oral BID  . predniSONE  20 mg Oral Q breakfast  . senna-docusate  1 tablet Oral Daily  . sodium chloride flush  3 mL Intravenous Q12H  . spironolactone  12.5 mg Oral Daily  . tiotropium  1 capsule Inhalation Daily  . torsemide  20 mg Oral BID   Infusions:   Labs: Recent Labs    04/12/19 0511 04/13/19 0419  NA 140 138  K 4.0 4.0  CL 100 104  CO2 26 24  GLUCOSE 112* 117*  BUN 54* 57*  CREATININE 1.98* 2.01*  CALCIUM 9.0 8.8*  PHOS 3.6  --    Recent Labs    04/12/19 0511  ALBUMIN 3.2*   Recent Labs    04/13/19 0419  WBC 8.8  HGB 12.7*  HCT 39.4  MCV 88.7  PLT 240   No results for input(s): CKTOTAL, CKMB, TROPONINI in the last 72 hours. Invalid input(s): POCBNP No results for input(s): HGBA1C in the last 72 hours.   Weights: Filed Weights   04/10/19 0436 04/10/19 1930 04/12/19 0420  Weight: 97.7 kg 101.4 kg 99.7 kg     Radiology/Studies:  Dg Chest Portable 1 View  Result Date: 04/07/2019 CLINICAL DATA:  Chest pain and shortness of breath for 1-2 days EXAM: PORTABLE CHEST 1 VIEW COMPARISON:  02/23/2019 FINDINGS: Cardiac  shadow is enlarged but stable. Increased vascular congestion is noted when compare with the prior exam with interstitial densities consistent with edema. No focal confluent infiltrate is seen. No sizable effusion is noted. IMPRESSION: Increasing vascular congestion with interstitial edema. Electronically Signed   By: Inez Catalina M.D.   On: 04/07/2019 13:42     Assessment and Recommendation  53 y.o. male with known severe dilated cardiomyopathy coronary artery disease essential hypertension mixed hyperlipidemia with previous ventricular tachycardia all completely treated on appropriate medication management and improving with changes in diuretics and stable today 1.  No change in amiodarone although would decrease dosage to 200 mg each day on discharge 2.  Continuation of carvedilol spironolactone furosemide for acute on chronic systolic dysfunction heart failure and avoid Entresto due to intolerance 3.  Continue current diuresis as per nephrology 4.  No further cardiac intervention at this time 5.  Okay for discharge home from cardiac standpoint with follow-up with [name]  Signed, Serafina Royals M.D. FACC

## 2019-04-13 NOTE — Telephone Encounter (Signed)
Reviewing the patient's chart to attempt to call him back and change his appt on 4/21 w/ [name] to a virtual visit.  The patient is is currently admitted and has been since 04/07/19. Will need to follow when he is discharged and call back regarding his appt with [name] on 4/21.  No progress note yet for today so I am unsure what the potential discharge date may be.

## 2019-04-14 ENCOUNTER — Encounter: Payer: Self-pay | Admitting: *Deleted

## 2019-04-14 ENCOUNTER — Inpatient Hospital Stay: Payer: PRIVATE HEALTH INSURANCE

## 2019-04-14 LAB — CREATININE, SERUM
Creatinine, Ser: 1.84 mg/dL — ABNORMAL HIGH (ref 0.61–1.24)
GFR calc Af Amer: 47 mL/min — ABNORMAL LOW (ref >60)
GFR calc non Af Amer: 41 mL/min — ABNORMAL LOW (ref >60)

## 2019-04-14 NOTE — Progress Notes (Signed)
[place] [facility] [place] NAME: Stephen [name]    MR#:  [id number]  DATE OF BIRTH:  06-12-1966  SUBJECTIVE:  CHIEF COMPLAINT:   Chief Complaint  Patient presents with  . Shortness of Breath  Shortness of breath improved, c/o abd pain. Hypotensive-improved.  No new complaints.  Tolerating diet and ambulating in the room. Had some nausea today. Felt some SOB on ambulation. REVIEW OF SYSTEMS:    Review of Systems  Constitutional: Positive for malaise/fatigue. Negative for chills and fever.  HENT: Negative for sore throat.   Eyes: Negative for blurred vision, double vision and pain.  Respiratory: Positive for shortness of breath. Negative for cough, hemoptysis and wheezing.   Cardiovascular: Negative for chest pain, palpitations, orthopnea and leg swelling.  Gastrointestinal: Positive for nausea. Negative for abdominal pain, constipation, diarrhea and heartburn.  Genitourinary: Negative for dysuria and hematuria.  Musculoskeletal: Negative for back pain and joint pain.  Skin: Negative for rash.  Neurological: Negative for sensory change, speech change, focal weakness and headaches.  Endo/Heme/Allergies: Does not bruise/bleed easily.  Psychiatric/Behavioral: Negative for depression. The patient is not nervous/anxious.    DRUG ALLERGIES:   Allergies  Allergen Reactions  . Lisinopril Cough    VITALS:  Blood pressure (!) 120/104, pulse 86, temperature (!) 97.4 F (36.3 C), temperature source Oral, resp. rate 20, height 6\' 1"  (1.854 m), weight 101 kg, SpO2 100 %.  PHYSICAL EXAMINATION:   Physical Exam  GENERAL:  53 y.o.-year-old patient lying in the bed with no acute distress.  EYES: Pupils equal, round, reactive to light and accommodation. No scleral icterus. Extraocular muscles intact.  HEENT: Head atraumatic, normocephalic. Oropharynx and nasopharynx clear.  NECK:  Supple, no jugular venous distention. No thyroid enlargement, no  tenderness.  LUNGS bibasilar crackles.  Good air entry. CARDIOVASCULAR: S1, S2 normal. No murmurs, rubs, or gallops.  ABDOMEN: Soft, nontender, nondistended. Bowel sounds present. No organomegaly or mass.  EXTREMITIES: No cyanosis, clubbing or edema b/l.    NEUROLOGIC: Cranial nerves II through XII are intact. No focal Motor or sensory deficits b/l.   PSYCHIATRIC: The patient is alert and oriented x 3.  SKIN: No obvious rash, lesion, or ulcer.   LABORATORY PANEL:   CBC Recent Labs  Lab 04/13/19 0419  WBC 8.8  HGB 12.7*  HCT 39.4  PLT 240   ------------------------------------------------------------------------------------------------------------------ Chemistries  Recent Labs  Lab 04/13/19 0419 04/14/19 0435  NA 138  --   K 4.0  --   CL 104  --   CO2 24  --   GLUCOSE 117*  --   BUN 57*  --   CREATININE 2.01* 1.84*  CALCIUM 8.8*  --    ------------------------------------------------------------------------------------------------------------------  Cardiac Enzymes No results for input(s): TROPONINI in the last 168 hours. ------------------------------------------------------------------------------------------------------------------  RADIOLOGY:  Dg Chest 2 View  Result Date: 04/14/2019 CLINICAL DATA:  Hypoxia and possible pneumonia in inpatient. History of CHF. EXAM: CHEST - 2 VIEW COMPARISON:  April 07, 2019 FINDINGS: Stable cardiomegaly. The AICD device is stable. No pneumothorax. Opacity in the lateral right upper lung is more conspicuous in the interval. Prominence of central lung markings identified. No other acute abnormalities. Nodular density projected anteriorly on the lateral view. IMPRESSION: 1. Cardiomegaly. 2. Prominence of vasculature centrally is suspected to represent pulmonary venous congestion/mild edema. 3. More focal opacity in the lateral right upper lung could represent developing infiltrate. Recommend attention on short-term follow-up. 4. Nodular  density projected over the anterior chest on  the lateral view was not seen on the February 17, 2019 study and could represent developing rounded/nodular infiltrate, artifact, or a pulmonary nodule. Recommend attention on short-term follow-up. Electronically Signed   By: Dorise Bullion III M.D   On: 04/14/2019 14:04     ASSESSMENT AND PLAN:  *Acute on chronic systolic congestive heart failure with ejection fraction of 10% - Reduce dose of Coreg & Entresto as BP low - ejection fraction of 10% - on heart transplant work up at [facility] chest shows some new changes in lungs, no fever or sputum production. WBCs normal Will monitor with torsemide, repeat xray tomorrow.  *Acute kidney injury over CKD stage III-due to cardiorenal syndrome Creatinine slowly trending down.   -Offfurosemide gtt. cut torsemide 20mg  BID today as BP soft.    - stopped enteresto.  - Reduce dose of spironolactone  * Hypotension with h/o Hypertension.   - reduce dose of coreg,  torsemide & spironolactone  * Hyperlipidemia: on lipitor   * nausea- PRN zofran  DVT prophylaxis with renally dosed Lovenox    All the records are reviewed and case discussed with Care Management/Social Workerr. Management plans discussed with the patient, nursing and they are in agreement.  CODE STATUS: FULL CODE  DVT Prophylaxis: SCDs  TOTAL TIME TAKING CARE OF THIS PATIENT: 35 minutes.   POSSIBLE D/C IN 1-2 DAYS, DEPENDING ON CLINICAL CONDITION.  Vaughan Basta M.D on 04/14/2019 at 4:29 PM  Between 7am to 6pm - Pager - [phone]  After 6pm go to [email or web address] - password [email or web address] [facility]  Office  [phone]  CC: Primary care physician; Valerie Roys, DO  Note: This dictation was prepared with Dragon dictation along with smaller phrase technology. Any transcriptional errors that result from this process are unintentional.

## 2019-04-14 NOTE — Plan of Care (Signed)
  Problem: Activity: Goal: Capacity to carry out activities will improve Outcome: Progressing Note:  Independent in room tolerating well   Problem: Clinical Measurements: Goal: Respiratory complications will improve Outcome: Progressing Note:  Room air   Problem: Activity: Goal: Risk for activity intolerance will decrease Outcome: Progressing   Problem: Pain Managment: Goal: General experience of comfort will improve Outcome: Progressing Note:  No complaints of pain this shift   Problem: Safety: Goal: Ability to remain free from injury will improve Outcome: Progressing   Problem: Skin Integrity: Goal: Risk for impaired skin integrity will decrease Outcome: Progressing   Problem: Education: Goal: Knowledge of General Education information will improve Description Including pain rating scale, medication(s)/side effects and non-pharmacologic comfort measures Outcome: Completed/Met   Problem: Nutrition: Goal: Adequate nutrition will be maintained Outcome: Completed/Met   Problem: Coping: Goal: Level of anxiety will decrease Outcome: Completed/Met

## 2019-04-14 NOTE — Progress Notes (Signed)
[facility] [place] NAME: Stephen [name]    MR#:  [id number]  DATE OF BIRTH:  13-Feb-1966  SUBJECTIVE:  CHIEF COMPLAINT:   Chief Complaint  Patient presents with  . Shortness of Breath  Shortness of breath improved, c/o abd pain. Hypotensive-improved.  No new complaints.  Tolerating diet and ambulating in the room. REVIEW OF SYSTEMS:    Review of Systems  Constitutional: Positive for malaise/fatigue. Negative for chills and fever.  HENT: Negative for sore throat.   Eyes: Negative for blurred vision, double vision and pain.  Respiratory: Positive for shortness of breath. Negative for cough, hemoptysis and wheezing.   Cardiovascular: Negative for chest pain, palpitations, orthopnea and leg swelling.  Gastrointestinal: Positive for nausea. Negative for abdominal pain, constipation, diarrhea and heartburn.  Genitourinary: Negative for dysuria and hematuria.  Musculoskeletal: Negative for back pain and joint pain.  Skin: Negative for rash.  Neurological: Negative for sensory change, speech change, focal weakness and headaches.  Endo/Heme/Allergies: Does not bruise/bleed easily.  Psychiatric/Behavioral: Negative for depression. The patient is not nervous/anxious.    DRUG ALLERGIES:   Allergies  Allergen Reactions  . Lisinopril Cough    VITALS:  Blood pressure (!) 120/104, pulse 86, temperature (!) 97.4 F (36.3 C), temperature source Oral, resp. rate 20, height 6\' 1"  (1.854 m), weight 101 kg, SpO2 100 %.  PHYSICAL EXAMINATION:   Physical Exam  GENERAL:  53 y.o.-year-old patient lying in the bed with no acute distress.  EYES: Pupils equal, round, reactive to light and accommodation. No scleral icterus. Extraocular muscles intact.  HEENT: Head atraumatic, normocephalic. Oropharynx and nasopharynx clear.  NECK:  Supple, no jugular venous distention. No thyroid enlargement, no tenderness.  LUNGS bibasilar crackles.  Good air  entry. CARDIOVASCULAR: S1, S2 normal. No murmurs, rubs, or gallops.  ABDOMEN: Soft, nontender, nondistended. Bowel sounds present. No organomegaly or mass.  EXTREMITIES: No cyanosis, clubbing or edema b/l.    NEUROLOGIC: Cranial nerves II through XII are intact. No focal Motor or sensory deficits b/l.   PSYCHIATRIC: The patient is alert and oriented x 3.  SKIN: No obvious rash, lesion, or ulcer.   LABORATORY PANEL:   CBC Recent Labs  Lab 04/13/19 0419  WBC 8.8  HGB 12.7*  HCT 39.4  PLT 240   ------------------------------------------------------------------------------------------------------------------ Chemistries  Recent Labs  Lab 04/13/19 0419 04/14/19 0435  NA 138  --   K 4.0  --   CL 104  --   CO2 24  --   GLUCOSE 117*  --   BUN 57*  --   CREATININE 2.01* 1.84*  CALCIUM 8.8*  --    ------------------------------------------------------------------------------------------------------------------  Cardiac Enzymes No results for input(s): TROPONINI in the last 168 hours. ------------------------------------------------------------------------------------------------------------------  RADIOLOGY:  Dg Chest 2 View  Result Date: 04/14/2019 CLINICAL DATA:  Hypoxia and possible pneumonia in inpatient. History of CHF. EXAM: CHEST - 2 VIEW COMPARISON:  April 07, 2019 FINDINGS: Stable cardiomegaly. The AICD device is stable. No pneumothorax. Opacity in the lateral right upper lung is more conspicuous in the interval. Prominence of central lung markings identified. No other acute abnormalities. Nodular density projected anteriorly on the lateral view. IMPRESSION: 1. Cardiomegaly. 2. Prominence of vasculature centrally is suspected to represent pulmonary venous congestion/mild edema. 3. More focal opacity in the lateral right upper lung could represent developing infiltrate. Recommend attention on short-term follow-up. 4. Nodular density projected over the anterior chest on the  lateral view was not seen on the February 17, 2019 study and could represent developing rounded/nodular infiltrate, artifact, or a pulmonary nodule. Recommend attention on short-term follow-up. Electronically Signed   By: Dorise Bullion III M.D   On: 04/14/2019 14:04     ASSESSMENT AND PLAN:  *Acute on chronic systolic congestive heart failure with ejection fraction of 10% - Reduce dose of Coreg & Entresto as BP low - ejection fraction of 10% - on heart transplant work up at [employer]  *Acute kidney injury over CKD stage III-due to cardiorenal syndrome Creatinine slowly trending down.   -Offfurosemide gtt. cut torsemide 20mg  BID today as BP soft.    - stopped enteresto.  - Reduce dose of spironolactone  * Hypotension with h/o Hypertension.   - reduce dose of coreg,  torsemide & spironolactone  * Hyperlipidemia: on lipitor   DVT prophylaxis with renally dosed Lovenox    All the records are reviewed and case discussed with Care Management/Social Workerr. Management plans discussed with the patient, nursing and they are in agreement.  CODE STATUS: FULL CODE  DVT Prophylaxis: SCDs  TOTAL TIME TAKING CARE OF THIS PATIENT: 35 minutes.   POSSIBLE D/C IN 1-2 DAYS, DEPENDING ON CLINICAL CONDITION.  Vaughan Basta M.D on 04/14/2019 at 4:26 PM  Between 7am to 6pm - Pager - [phone]  After 6pm go to [email or web address] - password [email or web address] [facility]  Office  [phone]  CC: Primary care physician; Valerie Roys, DO  Note: This dictation was prepared with Dragon dictation along with smaller phrase technology. Any transcriptional errors that result from this process are unintentional.

## 2019-04-14 NOTE — TOC Transition Note (Signed)
Transition of Care [facility]) - CM/SW Discharge Note   Patient Details  Name: Stephen [name] MRN: [id number] Date of Birth: Apr 09, 1966  Transition of Care [facility]) CM/SW Contact:  Elza Rafter, RN Phone Number: 04/14/2019, 11:18 AM   Clinical Narrative:   Patient discharging to home today.  Current with heart failure clinic.  No needs at this time by case management.     Final next level of care: Home/Self Care Barriers to Discharge: No Barriers Identified   Patient Goals and CMS Choice        Discharge Placement                       Discharge Plan and Services   Discharge Planning Services: CM Consult                      Social Determinants of Health (SDOH) Interventions     Readmission Risk Interventions Readmission Risk Prevention Plan 04/08/2019  Medication Review ([place]) Complete  PCP or Specialist appointment within 3-5 days of discharge Complete  HRI or [place] Complete  SW Recovery Care/Counseling Consult Patient refused  Palliative Care Screening Not [place] Not Applicable  Some recent data might be hidden

## 2019-04-14 NOTE — Progress Notes (Signed)
Ch f/u w/ pt to see if he was being d/c soon. Pt c/o shortness of breath and thinks there may still be fluid retention. This was confirmed by an X-ray that the pt had earlier. Ch practiced active listening and allowed pt time to lament about the challenges he has exp. Regarding his health after receiving a defibrillator. Pt seemed to hv a positive affect but was concerned about d/c too soon while having the same problems that lead to his hospitalization.  Ch provided words of encouragement and compassionate presence. Pt was appreciative of visit.     04/14/19 1500  Clinical Encounter Type  Visited With Patient  Visit Type Psychological support;Spiritual support;Social support  Spiritual Encounters  Spiritual Needs Emotional;Grief support  Stress Factors  Patient Stress Factors Major life changes;Health changes;Exhausted  Family Stress Factors None identified

## 2019-04-14 NOTE — Progress Notes (Signed)
Nutrition Follow Up Note   RD working remotely.  DOCUMENTATION CODES:   Not applicable  INTERVENTION:   Discontinue Ensure Enlive as pt is refusing supplements  MVI daily   NUTRITION DIAGNOSIS:   Inadequate oral intake related to decreased appetite, nausea as evidenced by meal completion < 50%, percent weight loss.  GOAL:   Patient will meet greater than or equal to 90% of their needs  -progressing  MONITOR:   PO intake, Supplement acceptance, Labs, Weight trends, Skin, I & O's  ASSESSMENT:   53 y.o. black male with chronic systolic heart failure ejection fraction 10-15%, prior history of cardiac arrest, hyperlipidemia, nephrolithiasis, hypertension, history of TIA, was admitted to [facility] on 04/07/2019 for acute exacerbation of systolic congestive heart failure, EF 10%   Pt with improved appetite and oral intake; pt eating 100% of meals. Pt is refusing Ensure; RD will discontinue. Per chart, pt remains fairly weight stable since admit.   Medications reviewed and include: allopurinol, aspirin, plavix, lovenox, tapazole, MVI, protonix, KCl, prednisone, senokot, aldactone, torsemide  Labs reviewed: creat 1.84(H)  Diet Order:   Diet Order            Diet Heart Room service appropriate? Yes; Fluid consistency: Thin; Fluid restriction: 1500 mL Fluid  Diet effective now             EDUCATION NEEDS:   Education needs have been addressed  Skin:  Skin Assessment: Reviewed RN Assessment  Last BM:  4/13  Height:   Ht Readings from Last 1 Encounters:  04/07/19 6\' 1"  (1.854 m)    Weight:   Wt Readings from Last 1 Encounters:  04/14/19 101 kg    Ideal Body Weight:  83.6 kg  BMI:  Body mass index is 29.37 kg/m.  Estimated Nutritional Needs:   Kcal:  2100-2300  Protein:  105-120 grams  Fluid:  1.5 L  Koleen Distance MS, RD, LDN Pager #- [phone] Office#- [phone] After Hours Pager: [phone]

## 2019-04-14 NOTE — Progress Notes (Signed)
[facility]  ROUNDING NOTE   Subjective:  Patient seen and evaluated at bedside. Creatinine down to his baseline of 1.8. Did have some vomiting earlier this a.m.  Objective:  Vital signs in last 24 hours:  Temp:  [97.4 F (36.3 C)-98 F (36.7 C)] 97.4 F (36.3 C) (04/14 0802) Pulse Rate:  [41-86] 86 (04/14 1124) Resp:  [18-20] 20 (04/14 0802) BP: (106-120)/(76-104) 120/104 (04/14 0802) SpO2:  [97 %-100 %] 100 % (04/14 0802) Weight:  [101 kg] 101 kg (04/14 0602)  Weight change:  Filed Weights   04/10/19 1930 04/12/19 0420 04/14/19 0602  Weight: 101.4 kg 99.7 kg 101 kg    Intake/Output: I/O last 3 completed shifts: In: 3 [I.V.:3] Out: 1350 [Urine:1350]   Intake/Output this shift:  No intake/output data recorded.  Physical Exam: General: NAD  Head: Normocephalic, atraumatic. Moist oral mucosal membranes  Eyes: Anicteri  Neck: Supple, trachea midline  Lungs:  CTAB, normal effort  Heart: Regular rate and rhythm  Abdomen:  Soft, nontender,   Extremities: no peripheral edema.  Neurologic: Nonfocal, moving all four extremities  Skin: No lesions       Basic Metabolic Panel: Recent Labs  Lab 04/09/19 0747 04/10/19 0520 04/11/19 0554 04/12/19 0511 04/13/19 0419 04/14/19 0435  NA 139 139 138 140 138  --   K 3.8 3.8 3.9 4.0 4.0  --   CL 104 103 101 100 104  --   CO2 24 24 26 26 24   --   GLUCOSE 108* 104* 125* 112* 117*  --   BUN 37* 50* 56* 54* 57*  --   CREATININE 2.42* 2.38* 2.20* 1.98* 2.01* 1.84*  CALCIUM 9.2 9.1 9.2 9.0 8.8*  --   PHOS  --   --   --  3.6  --   --     Liver Function Tests: Recent Labs  Lab 04/07/19 1327 04/12/19 0511  AST 16  --   ALT 15  --   ALKPHOS 88  --   BILITOT 1.3*  --   PROT 7.1  --   ALBUMIN 3.9 3.2*   Recent Labs  Lab 04/08/19 0756  LIPASE 17   No results for input(s): AMMONIA in the last 168 hours.  CBC: Recent Labs  Lab 04/07/19 1327 04/10/19 1207 04/13/19 0419  WBC 9.8 10.7* 8.8   NEUTROABS 7.7  --   --   HGB 13.9 12.9* 12.7*  HCT 43.1 40.5 39.4  MCV 89.2 89.4 88.7  PLT 301 243 240    Cardiac Enzymes: Recent Labs  Lab 04/07/19 1327  TROPONINI 0.03*    BNP: Invalid input(s): POCBNP  CBG: No results for input(s): GLUCAP in the last 168 hours.  Microbiology: Results for orders placed or performed in visit on 03/17/19  Novel Coronavirus, NAA ([facility])     Status: None   Collection Time: 03/17/19  9:51 AM  Result Value Ref Range Status   SARS-CoV-2, NAA Not Detected Not Detected Final    Comment: Testing was performed using the cobas(R) SARS-CoV-2 test. This test was developed and its performance characteristics determined by [employer]. This test has not been FDA cleared or approved. This test has been authorized by FDA under an Emergency Use Authorization (EUA). This test is only authorized for the duration of time the declaration that circumstances exist justifying the authorization of the emergency use of in vitro diagnostic tests for detection of SARS-CoV-2 virus and/or diagnosis of COVID-19 infection under section 564(b)(1) of the Act, 21  U.S.C. 360bbb-3(b)(1), unless the authorization is terminated or revoked sooner.     Coagulation Studies: No results for input(s): LABPROT, INR in the last 72 hours.  Urinalysis: No results for input(s): COLORURINE, LABSPEC, PHURINE, GLUCOSEU, HGBUR, BILIRUBINUR, KETONESUR, PROTEINUR, UROBILINOGEN, NITRITE, LEUKOCYTESUR in the last 72 hours.  Invalid input(s): APPERANCEUR    Imaging: No results found.   Medications:    . allopurinol  100 mg Oral Daily  . amiodarone  400 mg Oral Daily  . aspirin EC  81 mg Oral Daily  . atorvastatin  80 mg Oral q1800  . carvedilol  3.125 mg Oral Daily  . clopidogrel  75 mg Oral Daily  . enoxaparin (LOVENOX) injection  40 mg Subcutaneous Q24H  . feeding supplement (ENSURE ENLIVE)  237 mL Oral BID BM  . methimazole  40 mg Oral Daily  .  mometasone-formoterol  2 puff Inhalation BID  . multivitamin with minerals  1 tablet Oral Daily  . pantoprazole  40 mg Oral BID  . potassium chloride  20 mEq Oral BID  . predniSONE  20 mg Oral Q breakfast  . senna-docusate  1 tablet Oral Daily  . sodium chloride flush  3 mL Intravenous Q12H  . spironolactone  12.5 mg Oral Daily  . tiotropium  1 capsule Inhalation Daily  . torsemide  20 mg Oral BID   acetaminophen **OR** acetaminophen, albuterol, benzonatate, bismuth subsalicylate, cyclobenzaprine, diclofenac sodium, guaiFENesin, HYDROcodone-acetaminophen, nitroGLYCERIN, ondansetron **OR** ondansetron (ZOFRAN) IV, polyethylene glycol  Assessment/ Plan:  [name] is a 53 y.o. black male with chronic systolic heart failure ejection fraction 10-15%, prior history of cardiac arrest, hyperlipidemia, nephrolithiasis, hypertension, history of TIA, was admitted to [facility] on4/07/2019 for acute exacerbation of systolic congestive heart failure, EF 10%  1. Acute renal failure: secondary to acute cardiorenal syndrome 2. Chronic kidney disease stage III baseline creatinine 1.85, GFR of 47 on 01/24/19. Bland urine.  3. Acute on Chronic systolic heart failure. 4.  Metabolic acidosis 5. Hyperthyroidism 6. Hypertension  Plan - Renal function appears improved off of Entresto.  Continue to monitor renal parameters daily.  Okay to continue current dosage of torsemide and spironolactone.  He will need additional follow-up as an outpatient for his underlying chronic kidney disease.   LOS: 7 Stephen [name] 4/14/202012:56 PM

## 2019-04-15 ENCOUNTER — Inpatient Hospital Stay: Payer: PRIVATE HEALTH INSURANCE

## 2019-04-15 ENCOUNTER — Telehealth: Payer: Self-pay | Admitting: Cardiovascular Disease

## 2019-04-15 LAB — CBC
HCT: 39.9 % (ref 39.0–52.0)
Hemoglobin: 12.7 g/dL — ABNORMAL LOW (ref 13.0–17.0)
MCH: 28.9 pg (ref 26.0–34.0)
MCHC: 31.8 g/dL (ref 30.0–36.0)
MCV: 90.7 fL (ref 80.0–100.0)
Platelets: 243 10*3/uL (ref 150–400)
RBC: 4.4 MIL/uL (ref 4.22–5.81)
RDW: 18 % — ABNORMAL HIGH (ref 11.5–15.5)
WBC: 9.2 10*3/uL (ref 4.0–10.5)
nRBC: 0 % (ref 0.0–0.2)

## 2019-04-15 MED ORDER — AMIODARONE HCL 200 MG PO TABS: 400.0000 mg | ORAL_TABLET | Freq: Every day | ORAL | Status: DC

## 2019-04-15 MED ORDER — CARVEDILOL 3.125 MG PO TABS: 3.1250 mg | ORAL_TABLET | Freq: Every day | ORAL | 0 refills | Status: DC

## 2019-04-15 MED ORDER — SPIRONOLACTONE 25 MG PO TABS: 12.5000 mg | ORAL_TABLET | Freq: Every day | ORAL | 0 refills | Status: DC

## 2019-04-15 MED ORDER — TORSEMIDE 20 MG PO TABS: 20.0000 mg | ORAL_TABLET | Freq: Two times a day (BID) | ORAL | 0 refills | Status: DC

## 2019-04-15 NOTE — Plan of Care (Signed)
  Problem: Education: Goal: Ability to demonstrate management of disease process will improve Outcome: Progressing Goal: Ability to verbalize understanding of medication therapies will improve Outcome: Progressing   Problem: Clinical Measurements: Goal: Will remain free from infection Outcome: Progressing Note:  Remains afebrile Goal: Diagnostic test results will improve Outcome: Progressing Goal: Cardiovascular complication will be avoided Outcome: Progressing Note:  No chest pain or arrhythmias overnight

## 2019-04-15 NOTE — Discharge Summary (Signed)
[facility] [place] NAME: Stephen [name]    MR#:  [id number]  DATE OF BIRTH:  08/01/1966  DATE OF ADMISSION:  04/07/2019 ADMITTING PHYSICIAN: Hillary Bow, MD  DATE OF DISCHARGE: 04/15/19  PRIMARY CARE PHYSICIAN: Valerie Roys, DO    ADMISSION DIAGNOSIS:  Pulmonary edema cardiac cause (HCC) [I50.1] Acute on chronic congestive heart failure, unspecified heart failure type ([place]) [I50.9]  DISCHARGE DIAGNOSIS:  Active Problems:   CHF (congestive heart failure) ([place])   SECONDARY DIAGNOSIS:   Past Medical History:  Diagnosis Date  . AICD (automatic cardioverter/defibrillator) present   . AKI (acute kidney injury) ([place]) 12/24/2017  . Arrhythmia   . Arthritis    "lower back, knees" (06/10/2018)  . Cardiac arrest ([place]) 12/23/2017   Brief V-fib arrest  . Chest pain 09/08/2017  . CHF (congestive heart failure) (HCC)    nonischemic cardiomyopathy, EF 25%  . Gout    "on daily RX" (06/10/2018)  . Headache    "q couple months" (06/10/2018)  . High cholesterol   . History of kidney stones   . Hypertension   . Influenza A 12/24/2017  . NICM (nonischemic cardiomyopathy) ([place])   . Pneumonia 12/20/2017  . TIA (transient ischemic attack) 06/21/2016   "still affects my memory a little bit" (06/10/2018)    HOSPITAL COURSE:   *Acute on chronic systolic congestive heart failure with ejection fraction of 10% - Reduce dose of Coreg & Entresto as BP low - ejection fraction of 10% - on heart transplant work up at [facility] chest shows some new changes in lungs, no fever or sputum production. WBCs normal Could not tolerate enteresto. Cont torsemide   *Acute kidney injury over CKD stage III-due to cardiorenal syndrome Creatinine slowly trending down.   -Offfurosemide gtt.cut torsemide 20mg  BID today as BP soft. -stopped enteresto. - Reduce dose of spironolactone  * Hypotension with h/o Hypertension.  - reduce dose of  coreg,  torsemide & spironolactone  * Hyperlipidemia: on lipitor   * nausea- PRN zofran  DVT prophylaxis with renally dosed Lovenox  DISCHARGE CONDITIONS:   Stable.  CONSULTS OBTAINED:  Treatment Team:  Lavonia Dana, MD Isaias Cowman, MD  DRUG ALLERGIES:   Allergies  Allergen Reactions  . Lisinopril Cough    DISCHARGE MEDICATIONS:   Allergies as of 04/15/2019      Reactions   Lisinopril Cough      Medication List    STOP taking these medications   doxycycline 100 MG tablet Commonly known as:  VIBRA-TABS   Entresto 49-51 MG Generic drug:  sacubitril-valsartan     TAKE these medications   acetaminophen 325 MG tablet Commonly known as:  TYLENOL Take 2 tablets (650 mg total) by mouth every 6 (six) hours as needed. What changed:    how much to take  reasons to take this   albuterol 108 (90 Base) MCG/ACT inhaler Commonly known as:  PROVENTIL HFA;VENTOLIN HFA Inhale 2 puffs into the lungs every 4 (four) hours as needed for wheezing or shortness of breath.   allopurinol 100 MG tablet Commonly known as:  ZYLOPRIM Take 2 tablets (200 mg total) by mouth daily. What changed:    when to take this  reasons to take this   amiodarone 200 MG tablet Commonly known as:  PACERONE Take 2 tablets (400 mg total) by mouth daily.   aspirin 81 MG EC tablet Take 1 tablet (81 mg total) by mouth daily.   atorvastatin 40  MG tablet Commonly known as:  LIPITOR Take 80 mg by mouth daily at 6 PM.   benzonatate 200 MG capsule Commonly known as:  TESSALON Take 1 capsule (200 mg total) by mouth 2 (two) times daily as needed for cough.   CAMPHOR-EUCALYPTUS-MENTHOL EX Apply 1 application topically as needed (congestion).   carvedilol 3.125 MG tablet Commonly known as:  COREG Take 1 tablet (3.125 mg total) by mouth daily. What changed:  when to take this   clopidogrel 75 MG tablet Commonly known as:  PLAVIX TAKE ONE TABLET BY MOUTH EVERY DAY    colchicine 0.6 MG tablet Take 1 tablet (0.6 mg total) by mouth daily as needed (gout flare).   cyclobenzaprine 5 MG tablet Commonly known as:  FLEXERIL Take 1 tablet (5 mg total) by mouth 3 (three) times daily as needed (jaw pain).   diclofenac sodium 1 % Gel Commonly known as:  VOLTAREN Apply 2 g topically 4 (four) times daily as needed (pain).   famotidine 20 MG tablet Commonly known as:  Pepcid Take 1 tablet (20 mg total) by mouth 2 (two) times daily for 15 days. What changed:    when to take this  reasons to take this   fluticasone 50 MCG/ACT nasal spray Commonly known as:  FLONASE Place 2 sprays into both nostrils daily. What changed:    when to take this  reasons to take this   fluticasone-salmeterol 115-21 MCG/ACT inhaler Commonly known as:  Advair HFA Inhale 2 puffs into the lungs 2 (two) times daily.   guaiFENesin 100 MG/5ML Soln Commonly known as:  ROBITUSSIN Take 5 mLs (100 mg total) by mouth every 4 (four) hours as needed for cough or to loosen phlegm.   ICY HOT EX Apply 1 application topically daily as needed (pain).   methimazole 10 MG tablet Commonly known as:  TAPAZOLE Take 4 tablets (40 mg total) by mouth daily.   metolazone 2.5 MG tablet Commonly known as:  ZAROXOLYN Take 2.5 mg by mouth as needed.   multivitamin with minerals Tabs tablet Take 1 tablet by mouth daily.   nitroGLYCERIN 0.4 MG SL tablet Commonly known as:  NITROSTAT Place 1 tablet (0.4 mg total) under the tongue every 5 (five) minutes as needed for chest pain.   ondansetron 4 MG disintegrating tablet Commonly known as:  Zofran ODT Take 1 tablet (4 mg total) by mouth every 8 (eight) hours as needed for nausea or vomiting.   pantoprazole 40 MG tablet Commonly known as:  PROTONIX Take 1 tablet (40 mg total) by mouth 2 (two) times daily.   potassium chloride SA 20 MEQ tablet Commonly known as:  K-DUR,KLOR-CON Take 1 tablet (20 mEq total) by mouth 2 (two) times daily. What  changed:  when to take this   predniSONE 20 MG tablet Commonly known as:  DELTASONE Take 1 tablet (20 mg total) by mouth daily with breakfast.   promethazine 12.5 MG tablet Commonly known as:  PHENERGAN Take 1 tablet (12.5 mg total) by mouth every 6 (six) hours as needed for nausea or vomiting.   sennosides-docusate sodium 8.6-50 MG tablet Commonly known as:  SENOKOT-S Take 1 tablet by mouth daily.   sodium chloride 0.65 % Soln nasal spray Commonly known as:  OCEAN Place 1 spray into both nostrils as needed for congestion.   spironolactone 25 MG tablet Commonly known as:  ALDACTONE Take 0.5 tablets (12.5 mg total) by mouth daily.   Tiotropium Bromide Monohydrate 2.5 MCG/ACT Aers Commonly known as:  Spiriva Respimat Inhale 1 puff into the lungs daily.   torsemide 20 MG tablet Commonly known as:  DEMADEX Take 1 tablet (20 mg total) by mouth 2 (two) times daily. What changed:    how much to take  when to take this        DISCHARGE INSTRUCTIONS:    Follow with cardiology clinic in 1 week.  If you experience worsening of your admission symptoms, develop shortness of breath, life threatening emergency, suicidal or homicidal thoughts you must seek medical attention immediately by calling 911 or calling your MD immediately  if symptoms less severe.  You Must read complete instructions/literature along with all the possible adverse reactions/side effects for all the Medicines you take and that have been prescribed to you. Take any new Medicines after you have completely understood and accept all the possible adverse reactions/side effects.   Please note  You were cared for by a hospitalist during your hospital stay. If you have any questions about your discharge medications or the care you received while you were in the hospital after you are discharged, you can call the unit and asked to speak with the hospitalist on call if the hospitalist that took care of you is not  available. Once you are discharged, your primary care physician will handle any further medical issues. Please note that NO REFILLS for any discharge medications will be authorized once you are discharged, as it is imperative that you return to your primary care physician (or establish a relationship with a primary care physician if you do not have one) for your aftercare needs so that they can reassess your need for medications and monitor your lab values.    Today   CHIEF COMPLAINT:   Chief Complaint  Patient presents with  . Shortness of Breath    HISTORY OF PRESENT ILLNESS:  Stephen [name]  is a 53 y.o. male with a known history of chronic systolic CHF with ejection fraction of 10%, AICD, hypertension, CKD stage III presents to the emergency room complaining of worsening shortness of breath over the past 4 days.  Patient was seen by primary care physician and started on antibiotics for possible pneumonia.  He has not had any fever.  White blood cell count is normal.  Chest x-ray showing pulmonary edema.  Patient will be admitted to hospitalist service.  He is visibly tachypneic with ejection fraction of 10% and high risk. In the past patient has not diuresed well with IV Lasix boluses and progress to acute kidney injury.  He has done well on Lasix drip.    VITAL SIGNS:  Blood pressure (!) 109/93, pulse 69, temperature 98.3 F (36.8 C), resp. rate 19, height 6\' 1"  (1.854 m), weight 101.2 kg, SpO2 96 %.  I/O:    Intake/Output Summary (Last 24 hours) at 04/15/2019 1345 Last data filed at 04/15/2019 1335 Gross per 24 hour  Intake 486 ml  Output 1300 ml  Net -814 ml    PHYSICAL EXAMINATION:  GENERAL:  53 y.o.-year-old patient lying in the bed with no acute distress.  EYES: Pupils equal, round, reactive to light and accommodation. No scleral icterus. Extraocular muscles intact.  HEENT: Head atraumatic, normocephalic. Oropharynx and nasopharynx clear.  NECK:  Supple, no jugular  venous distention. No thyroid enlargement, no tenderness.  LUNGS: Normal breath sounds bilaterally, no wheezing, rales,rhonchi or crepitation. No use of accessory muscles of respiration.  CARDIOVASCULAR: S1, S2 normal. No murmurs, rubs, or gallops.  ABDOMEN: Soft, non-tender, non-distended. Bowel  sounds present. No organomegaly or mass.  EXTREMITIES: No pedal edema, cyanosis, or clubbing.  NEUROLOGIC: Cranial nerves II through XII are intact. Muscle strength 5/5 in all extremities. Sensation intact. Gait not checked.  PSYCHIATRIC: The patient is alert and oriented x 3.  SKIN: No obvious rash, lesion, or ulcer.   DATA REVIEW:   CBC Recent Labs  Lab 04/15/19 0435  WBC 9.2  HGB 12.7*  HCT 39.9  PLT 243    Chemistries  Recent Labs  Lab 04/13/19 0419 04/14/19 0435  NA 138  --   K 4.0  --   CL 104  --   CO2 24  --   GLUCOSE 117*  --   BUN 57*  --   CREATININE 2.01* 1.84*  CALCIUM 8.8*  --     Cardiac Enzymes No results for input(s): TROPONINI in the last 168 hours.  Microbiology Results  Results for orders placed or performed in visit on 03/17/19  Novel Coronavirus, NAA ([facility])     Status: None   Collection Time: 03/17/19  9:51 AM  Result Value Ref Range Status   SARS-CoV-2, NAA Not Detected Not Detected Final    Comment: Testing was performed using the cobas(R) SARS-CoV-2 test. This test was developed and its performance characteristics determined by [employer]. This test has not been FDA cleared or approved. This test has been authorized by FDA under an Emergency Use Authorization (EUA). This test is only authorized for the duration of time the declaration that circumstances exist justifying the authorization of the emergency use of in vitro diagnostic tests for detection of SARS-CoV-2 virus and/or diagnosis of COVID-19 infection under section 564(b)(1) of the Act, 21 U.S.C. 740CXK-4(Y)(1), unless the authorization is terminated or revoked sooner.      RADIOLOGY:  Dg Chest 1 View  Result Date: 04/15/2019 CLINICAL DATA:  Hypoxia. EXAM: CHEST  1 VIEW COMPARISON:  April 14, 2019 FINDINGS: Stable cardiomegaly. Opacity in the left retrocardiac region is identified. Diffuse primarily interstitial opacities. More focal opacity in the right upper lobe described on the previous study is no longer visualized. No other interval changes. IMPRESSION: 1. The findings are most consistent with cardiomegaly and pulmonary edema. Opacity in the left retrocardiac region is similar, possibly atelectasis. Electronically Signed   By: Dorise Bullion III M.D   On: 04/15/2019 08:30   Dg Chest 2 View  Result Date: 04/14/2019 CLINICAL DATA:  Hypoxia and possible pneumonia in inpatient. History of CHF. EXAM: CHEST - 2 VIEW COMPARISON:  April 07, 2019 FINDINGS: Stable cardiomegaly. The AICD device is stable. No pneumothorax. Opacity in the lateral right upper lung is more conspicuous in the interval. Prominence of central lung markings identified. No other acute abnormalities. Nodular density projected anteriorly on the lateral view. IMPRESSION: 1. Cardiomegaly. 2. Prominence of vasculature centrally is suspected to represent pulmonary venous congestion/mild edema. 3. More focal opacity in the lateral right upper lung could represent developing infiltrate. Recommend attention on short-term follow-up. 4. Nodular density projected over the anterior chest on the lateral view was not seen on the February 17, 2019 study and could represent developing rounded/nodular infiltrate, artifact, or a pulmonary nodule. Recommend attention on short-term follow-up. Electronically Signed   By: Dorise Bullion III M.D   On: 04/14/2019 14:04    EKG:   Orders placed or performed during the hospital encounter of 04/07/19  . EKG 12-Lead  . EKG 12-Lead      Management plans discussed with the patient, family and they are  in agreement.  CODE STATUS:     Code Status Orders  (From  admission, onward)         Start     Ordered   04/07/19 1426  Full code  Continuous     04/07/19 1426        Code Status History    Date Active Date Inactive Code Status Order ID Comments User Context   02/18/2019 0021 03/02/2019 0025 Full Code [id number]  [name], MD ED   02/13/2019 1451 02/14/2019 2116 Full Code [id number]  Hillary Bow, MD ED   12/27/2018 0600 12/28/2018 1829 Partial Code [id number]  Harrie Foreman, MD Inpatient   12/04/2018 0437 12/10/2018 2037 Partial Code [id number]  Bradly Bienenstock, NP Inpatient   12/04/2018 0147 12/04/2018 0437 Full Code [id number]  Henreitta Leber, MD ED   08/19/2018 2248 08/20/2018 2258 Full Code [id number]  Amelia Jo, MD Inpatient   06/30/2018 1757 07/06/2018 1647 Full Code [id number]  [name], MD Inpatient   06/10/2018 1606 06/11/2018 1537 Full Code [id number]  Thompson Grayer, MD Inpatient   12/23/2017 1153 12/31/2017 1729 Full Code [id number]  Erlene Quan, PA-C Inpatient   12/20/2017 1851 12/23/2017 1110 Full Code [id number]  [name], MD Inpatient   09/08/2017 0644 09/10/2017 2127 Full Code [id number]  Harrie Foreman, MD Inpatient   07/29/2017 1044 07/31/2017 2152 Full Code [id number]  Henreitta Leber, MD Inpatient   06/19/2017 0027 06/20/2017 2114 Full Code [id number]  Lance Coon, MD ED   05/20/2017 1226 05/21/2017 1759 Full Code [id number]  [name], MD Inpatient   06/22/2016 0430 06/22/2016 2150 Full Code [id number]  Quintella Baton, MD Inpatient   06/04/2016 1843 06/06/2016 1815 Full Code [id number]  [name], MD Inpatient      TOTAL TIME TAKING CARE OF THIS PATIENT: 35 minutes.    Vaughan Basta M.D on 04/15/2019 at 1:45 PM  Between 7am to 6pm - Pager - [phone]  After 6pm go to [email or web address] - password [email or web address] [facility]  Office  [phone]  CC: Primary care physician; Valerie Roys, DO   Note: This dictation was prepared with Dragon dictation along with smaller phrase  technology. Any transcriptional errors that result from this process are unintentional.

## 2019-04-15 NOTE — TOC Transition Note (Signed)
Transition of Care [facility]) - CM/SW Discharge Note   Patient Details  Name: Stephen [name] MRN: [id number] Date of Birth: 05/11/1966  [facility] CM/SW Contact:  Elza Rafter, RN Phone Number: 04/15/2019, 3:58 PM   Clinical Narrative:   Discharging today to home.  Heart failure clinic appointment scheduled.      Final next level of care: Home/Self Care Barriers to Discharge: No Barriers Identified   Patient Goals and CMS Choice        Discharge Placement                       Discharge Plan and Services   Discharge Planning Services: CM Consult                      Social Determinants of Health (SDOH) Interventions     Readmission Risk Interventions Readmission Risk Prevention Plan 04/08/2019  Medication Review ([place]) Complete  PCP or Specialist appointment within 3-5 days of discharge Complete  HRI or [place] Complete  SW Recovery Care/Counseling Consult Patient refused  Palliative Care Screening Not [place] Not Applicable  Some recent data might be hidden

## 2019-04-15 NOTE — Progress Notes (Signed)
[facility]  ROUNDING NOTE   Subjective:  Patient seen at bedside. Overall appears to be doing better. No new renal function testing available today.  Objective:  Vital signs in last 24 hours:  Temp:  [97.8 F (36.6 C)-98.3 F (36.8 C)] 98.3 F (36.8 C) (04/15 0752) Pulse Rate:  [65-69] 69 (04/15 0752) Resp:  [16-20] 19 (04/15 0752) BP: (92-109)/(75-93) 109/93 (04/15 0752) SpO2:  [96 %-100 %] 96 % (04/15 0752) Weight:  [101.2 kg] 101.2 kg (04/15 0432)  Weight change: 0.272 kg Filed Weights   04/12/19 0420 04/14/19 0602 04/15/19 0432  Weight: 99.7 kg 101 kg 101.2 kg    Intake/Output: I/O last 3 completed shifts: In: 6 [I.V.:6] Out: 1425 [Urine:1425]   Intake/Output this shift:  Total I/O In: 243 [P.O.:240; I.V.:3] Out: 400 [Urine:400]  Physical Exam: General: NAD  Head: Normocephalic, atraumatic. Moist oral mucosal membranes  Eyes: Anicteric  Neck: Supple, trachea midline  Lungs:  CTAB, normal effort  Heart: Regular rate and rhythm  Abdomen:  Soft, nontender,   Extremities: no peripheral edema.  Neurologic: Nonfocal, moving all four extremities  Skin: No lesions       Basic Metabolic Panel: Recent Labs  Lab 04/09/19 0747 04/10/19 0520 04/11/19 0554 04/12/19 0511 04/13/19 0419 04/14/19 0435  NA 139 139 138 140 138  --   K 3.8 3.8 3.9 4.0 4.0  --   CL 104 103 101 100 104  --   CO2 24 24 26 26 24   --   GLUCOSE 108* 104* 125* 112* 117*  --   BUN 37* 50* 56* 54* 57*  --   CREATININE 2.42* 2.38* 2.20* 1.98* 2.01* 1.84*  CALCIUM 9.2 9.1 9.2 9.0 8.8*  --   PHOS  --   --   --  3.6  --   --     Liver Function Tests: Recent Labs  Lab 04/12/19 0511  ALBUMIN 3.2*   No results for input(s): LIPASE, AMYLASE in the last 168 hours. No results for input(s): AMMONIA in the last 168 hours.  CBC: Recent Labs  Lab 04/10/19 1207 04/13/19 0419 04/15/19 0435  WBC 10.7* 8.8 9.2  HGB 12.9* 12.7* 12.7*  HCT 40.5 39.4 39.9  MCV 89.4 88.7 90.7   PLT 243 240 243    Cardiac Enzymes: No results for input(s): CKTOTAL, CKMB, CKMBINDEX, TROPONINI in the last 168 hours.  BNP: Invalid input(s): POCBNP  CBG: No results for input(s): GLUCAP in the last 168 hours.  Microbiology: Results for orders placed or performed in visit on 03/17/19  Novel Coronavirus, NAA ([facility])     Status: None   Collection Time: 03/17/19  9:51 AM  Result Value Ref Range Status   SARS-CoV-2, NAA Not Detected Not Detected Final    Comment: Testing was performed using the cobas(R) SARS-CoV-2 test. This test was developed and its performance characteristics determined by [employer]. This test has not been FDA cleared or approved. This test has been authorized by FDA under an Emergency Use Authorization (EUA). This test is only authorized for the duration of time the declaration that circumstances exist justifying the authorization of the emergency use of in vitro diagnostic tests for detection of SARS-CoV-2 virus and/or diagnosis of COVID-19 infection under section 564(b)(1) of the Act, 21 U.S.C. 222LNL-8(X)(2), unless the authorization is terminated or revoked sooner.     Coagulation Studies: No results for input(s): LABPROT, INR in the last 72 hours.  Urinalysis: No results for input(s): COLORURINE, LABSPEC, [place], [place],  HGBUR, BILIRUBINUR, KETONESUR, PROTEINUR, UROBILINOGEN, NITRITE, LEUKOCYTESUR in the last 72 hours.  Invalid input(s): APPERANCEUR    Imaging: Dg Chest 1 View  Result Date: 04/15/2019 CLINICAL DATA:  Hypoxia. EXAM: CHEST  1 VIEW COMPARISON:  April 14, 2019 FINDINGS: Stable cardiomegaly. Opacity in the left retrocardiac region is identified. Diffuse primarily interstitial opacities. More focal opacity in the right upper lobe described on the previous study is no longer visualized. No other interval changes. IMPRESSION: 1. The findings are most consistent with cardiomegaly and pulmonary edema. Opacity in the left  retrocardiac region is similar, possibly atelectasis. Electronically Signed   By: Dorise Bullion III M.D   On: 04/15/2019 08:30   Dg Chest 2 View  Result Date: 04/14/2019 CLINICAL DATA:  Hypoxia and possible pneumonia in inpatient. History of CHF. EXAM: CHEST - 2 VIEW COMPARISON:  April 07, 2019 FINDINGS: Stable cardiomegaly. The AICD device is stable. No pneumothorax. Opacity in the lateral right upper lung is more conspicuous in the interval. Prominence of central lung markings identified. No other acute abnormalities. Nodular density projected anteriorly on the lateral view. IMPRESSION: 1. Cardiomegaly. 2. Prominence of vasculature centrally is suspected to represent pulmonary venous congestion/mild edema. 3. More focal opacity in the lateral right upper lung could represent developing infiltrate. Recommend attention on short-term follow-up. 4. Nodular density projected over the anterior chest on the lateral view was not seen on the February 17, 2019 study and could represent developing rounded/nodular infiltrate, artifact, or a pulmonary nodule. Recommend attention on short-term follow-up. Electronically Signed   By: Dorise Bullion III M.D   On: 04/14/2019 14:04     Medications:    . allopurinol  100 mg Oral Daily  . amiodarone  400 mg Oral Daily  . aspirin EC  81 mg Oral Daily  . atorvastatin  80 mg Oral q1800  . carvedilol  3.125 mg Oral Daily  . clopidogrel  75 mg Oral Daily  . enoxaparin (LOVENOX) injection  40 mg Subcutaneous Q24H  . feeding supplement (ENSURE ENLIVE)  237 mL Oral BID BM  . methimazole  40 mg Oral Daily  . mometasone-formoterol  2 puff Inhalation BID  . multivitamin with minerals  1 tablet Oral Daily  . pantoprazole  40 mg Oral BID  . potassium chloride  20 mEq Oral BID  . predniSONE  20 mg Oral Q breakfast  . senna-docusate  1 tablet Oral Daily  . sodium chloride flush  3 mL Intravenous Q12H  . spironolactone  12.5 mg Oral Daily  . tiotropium  1 capsule  Inhalation Daily  . torsemide  20 mg Oral BID   acetaminophen **OR** acetaminophen, albuterol, benzonatate, bismuth subsalicylate, cyclobenzaprine, diclofenac sodium, guaiFENesin, HYDROcodone-acetaminophen, nitroGLYCERIN, ondansetron **OR** ondansetron (ZOFRAN) IV, polyethylene glycol  Assessment/ Plan:  [name] is a 52 y.o. black male with chronic systolic heart failure ejection fraction 10-15%, prior history of cardiac arrest, hyperlipidemia, nephrolithiasis, hypertension, history of TIA, was admitted to [facility] on[facility]/07/2019 for acute exacerbation of systolic congestive heart failure, EF 10%  1. Acute renal failure: secondary to acute cardiorenal syndrome 2. Chronic kidney disease stage III baseline creatinine 1.85, GFR of 47 on 01/24/19. Bland urine.  3. Acute on Chronic systolic heart failure. 4.  Metabolic acidosis 5. Hyperthyroidism 6. Hypertension  Plan - No new renal function testing available today.  Recommend periodic renal function testing.  However his creatinine was down to 1.8 which is his baseline yesterday.  His heart failure symptoms have improved from admission.  Continue torsemide 20 mg twice daily as well as spinal lactone 12.5 mg daily.  He unfortunately did not tolerate Entresto.  Further management per hospitalist.   LOS: 8 Terik Haughey 4/15/202011:39 AM

## 2019-04-15 NOTE — Telephone Encounter (Signed)
Received Disability Determination Services forms to be completed Placed in interoffice mail and send to [facility]

## 2019-04-15 NOTE — Plan of Care (Signed)
  Problem: Clinical Measurements: Goal: Will remain free from infection Outcome: Progressing Goal: Respiratory complications will improve Outcome: Progressing Note:  Room air   Problem: Activity: Goal: Risk for activity intolerance will decrease Outcome: Progressing Note:  Independent in room- tolerating well   Problem: Elimination: Goal: Will not experience complications related to bowel motility Outcome: Progressing Goal: Will not experience complications related to urinary retention Outcome: Progressing   Problem: Pain Managment: Goal: General experience of comfort will improve Outcome: Progressing Note:  No complaints of pain this shift   Problem: Safety: Goal: Ability to remain free from injury will improve Outcome: Progressing   Problem: Skin Integrity: Goal: Risk for impaired skin integrity will decrease Outcome: Progressing

## 2019-04-15 NOTE — Progress Notes (Signed)
Pt discharged to home via wc.  Instructions and rx given to pt.  Questions answered.  No distress.  

## 2019-04-16 ENCOUNTER — Telehealth (HOSPITAL_COMMUNITY): Payer: Self-pay

## 2019-04-16 NOTE — Telephone Encounter (Signed)
Contacted Stephen [name] to see how he was doing after his hospital stay.  He states feeling better, he is taking it easy.  Denies short of breath or chest pain.  He has all his medications and verified his meds.  Discussed diet and fluid.  He is aware of his appt with Otila Kluver next week, will try to be there when the telephone appt is so I can assist if she needs me.  Will visit for heart failure at this time.   [place] [phone]

## 2019-04-17 ENCOUNTER — Telehealth: Payer: Commercial Managed Care - PPO | Admitting: Family

## 2019-04-17 ENCOUNTER — Encounter: Payer: Self-pay | Admitting: Cardiology

## 2019-04-18 ENCOUNTER — Other Ambulatory Visit: Payer: Self-pay

## 2019-04-18 ENCOUNTER — Observation Stay
Admission: EM | Admit: 2019-04-18 | Discharge: 2019-04-19 | Disposition: A | Discharge status: Home / Self Care | Payer: PRIVATE HEALTH INSURANCE | Attending: Internal Medicine | Admitting: Internal Medicine

## 2019-04-18 DIAGNOSIS — K922 Gastrointestinal hemorrhage, unspecified: Secondary | ICD-10-CM | POA: Diagnosis not present

## 2019-04-18 DIAGNOSIS — Z7951 Long term (current) use of inhaled steroids: Secondary | ICD-10-CM | POA: Diagnosis not present

## 2019-04-18 DIAGNOSIS — Z7952 Long term (current) use of systemic steroids: Secondary | ICD-10-CM | POA: Insufficient documentation

## 2019-04-18 DIAGNOSIS — Z87891 Personal history of nicotine dependence: Secondary | ICD-10-CM | POA: Insufficient documentation

## 2019-04-18 DIAGNOSIS — Z95810 Presence of automatic (implantable) cardiac defibrillator: Secondary | ICD-10-CM | POA: Diagnosis not present

## 2019-04-18 DIAGNOSIS — N183 Chronic kidney disease, stage 3 unspecified: Secondary | ICD-10-CM | POA: Diagnosis present

## 2019-04-18 DIAGNOSIS — N529 Male erectile dysfunction, unspecified: Secondary | ICD-10-CM | POA: Insufficient documentation

## 2019-04-18 DIAGNOSIS — I493 Ventricular premature depolarization: Secondary | ICD-10-CM | POA: Insufficient documentation

## 2019-04-18 DIAGNOSIS — I4901 Ventricular fibrillation: Secondary | ICD-10-CM | POA: Insufficient documentation

## 2019-04-18 DIAGNOSIS — I252 Old myocardial infarction: Secondary | ICD-10-CM | POA: Insufficient documentation

## 2019-04-18 DIAGNOSIS — Z791 Long term (current) use of non-steroidal anti-inflammatories (NSAID): Secondary | ICD-10-CM | POA: Insufficient documentation

## 2019-04-18 DIAGNOSIS — E785 Hyperlipidemia, unspecified: Secondary | ICD-10-CM | POA: Diagnosis not present

## 2019-04-18 DIAGNOSIS — Z8674 Personal history of sudden cardiac arrest: Secondary | ICD-10-CM | POA: Insufficient documentation

## 2019-04-18 DIAGNOSIS — Z7902 Long term (current) use of antithrombotics/antiplatelets: Secondary | ICD-10-CM | POA: Insufficient documentation

## 2019-04-18 DIAGNOSIS — Z87442 Personal history of urinary calculi: Secondary | ICD-10-CM | POA: Diagnosis not present

## 2019-04-18 DIAGNOSIS — I2510 Atherosclerotic heart disease of native coronary artery without angina pectoris: Secondary | ICD-10-CM | POA: Insufficient documentation

## 2019-04-18 DIAGNOSIS — I5022 Chronic systolic (congestive) heart failure: Secondary | ICD-10-CM | POA: Diagnosis not present

## 2019-04-18 DIAGNOSIS — I130 Hypertensive heart and chronic kidney disease with heart failure and stage 1 through stage 4 chronic kidney disease, or unspecified chronic kidney disease: Secondary | ICD-10-CM | POA: Insufficient documentation

## 2019-04-18 DIAGNOSIS — I10 Essential (primary) hypertension: Secondary | ICD-10-CM | POA: Diagnosis present

## 2019-04-18 DIAGNOSIS — E7800 Pure hypercholesterolemia, unspecified: Secondary | ICD-10-CM | POA: Diagnosis not present

## 2019-04-18 DIAGNOSIS — M109 Gout, unspecified: Secondary | ICD-10-CM | POA: Diagnosis not present

## 2019-04-18 DIAGNOSIS — J449 Chronic obstructive pulmonary disease, unspecified: Secondary | ICD-10-CM | POA: Diagnosis not present

## 2019-04-18 DIAGNOSIS — Z8249 Family history of ischemic heart disease and other diseases of the circulatory system: Secondary | ICD-10-CM | POA: Diagnosis not present

## 2019-04-18 DIAGNOSIS — Z8673 Personal history of transient ischemic attack (TIA), and cerebral infarction without residual deficits: Secondary | ICD-10-CM | POA: Diagnosis not present

## 2019-04-18 DIAGNOSIS — Z7982 Long term (current) use of aspirin: Secondary | ICD-10-CM | POA: Diagnosis not present

## 2019-04-18 DIAGNOSIS — Z79899 Other long term (current) drug therapy: Secondary | ICD-10-CM | POA: Insufficient documentation

## 2019-04-18 DIAGNOSIS — I428 Other cardiomyopathies: Secondary | ICD-10-CM | POA: Diagnosis not present

## 2019-04-18 DIAGNOSIS — M1711 Unilateral primary osteoarthritis, right knee: Secondary | ICD-10-CM | POA: Insufficient documentation

## 2019-04-18 LAB — BASIC METABOLIC PANEL
Anion gap: 13 (ref 5–15)
BUN: 53 mg/dL — ABNORMAL HIGH (ref 6–20)
CO2: 27 mmol/L (ref 22–32)
Calcium: 9.5 mg/dL (ref 8.9–10.3)
Chloride: 95 mmol/L — ABNORMAL LOW (ref 98–111)
Creatinine, Ser: 1.91 mg/dL — ABNORMAL HIGH (ref 0.61–1.24)
GFR calc Af Amer: 45 mL/min — ABNORMAL LOW (ref >60)
GFR calc non Af Amer: 39 mL/min — ABNORMAL LOW (ref >60)
Glucose, Bld: 96 mg/dL (ref 70–99)
Potassium: 4.2 mmol/L (ref 3.5–5.1)
Sodium: 135 mmol/L (ref 135–145)

## 2019-04-18 LAB — CBC
HCT: 48.6 % (ref 39.0–52.0)
Hemoglobin: 15.5 g/dL (ref 13.0–17.0)
MCH: 28.3 pg (ref 26.0–34.0)
MCHC: 31.9 g/dL (ref 30.0–36.0)
MCV: 88.7 fL (ref 80.0–100.0)
Platelets: 294 10*3/uL (ref 150–400)
RBC: 5.48 MIL/uL (ref 4.22–5.81)
RDW: 18.4 % — ABNORMAL HIGH (ref 11.5–15.5)
WBC: 11.1 10*3/uL — ABNORMAL HIGH (ref 4.0–10.5)
nRBC: 0 % (ref 0.0–0.2)

## 2019-04-18 LAB — HEMOGLOBIN AND HEMATOCRIT, BLOOD
HCT: 45.9 % (ref 39.0–52.0)
Hemoglobin: 14.6 g/dL (ref 13.0–17.0)

## 2019-04-18 NOTE — ED Notes (Signed)
ED TO INPATIENT HANDOFF REPORT  ED Nurse Name and Phone #: Balinda Quails Name/Age/Gender Stephen [name] 53 y.o. male Room/Bed: ED16A/ED16A  Code Status   Code Status: Prior  Home/SNF/Other Home Patient oriented to: self, place, time and situation Is this baseline? Yes   Triage Complete: Triage complete  Chief Complaint Bloody Stool  Triage Note Per patient's report, patient experienced blood in toilet after bowel movements today and yesterday. Patient is on blood thinners.   Allergies Allergies  Allergen Reactions  . Lisinopril Cough    Level of Care/Admitting Diagnosis ED Disposition    ED Disposition Condition [facility] Area: [place] [100120]  Level of Care: Med-Surg [16]  Covid Evaluation: N/A  Diagnosis: Lower GI bleed [166063]  Admitting Physician: Lance Coon [[id number]]  Attending Physician: Lance Coon [[id number]]  PT Class (Do Not Modify): Observation [104]  PT Acc Code (Do Not Modify): Observation [10022]       B Medical/Surgery History Past Medical History:  Diagnosis Date  . AICD (automatic cardioverter/defibrillator) present   . AKI (acute kidney injury) ([place]) 12/24/2017  . Arrhythmia   . Arthritis    "lower back, knees" (06/10/2018)  . Cardiac arrest ([place]) 12/23/2017   Brief V-fib arrest  . Chest pain 09/08/2017  . CHF (congestive heart failure) (HCC)    nonischemic cardiomyopathy, EF 25%  . Gout    "on daily RX" (06/10/2018)  . Headache    "q couple months" (06/10/2018)  . High cholesterol   . History of kidney stones   . Hypertension   . Influenza A 12/24/2017  . NICM (nonischemic cardiomyopathy) ([place])   . Pneumonia 12/20/2017  . TIA (transient ischemic attack) 06/21/2016   "still affects my memory a little bit" (06/10/2018)   Past Surgical History:  Procedure Laterality Date  . EXTERNAL FIXATION LEG Right ~ 2000   "was going bowlegged; had to brake my leg to fix it"  . HERNIA REPAIR    .  ICD IMPLANT N/A 12/30/2017   Procedure: ICD IMPLANT;  Surgeon: Deboraha Sprang, MD;  Location: [facility];  Service: Cardiovascular;  Laterality: N/A;  . LEFT HEART CATH AND CORONARY ANGIOGRAPHY N/A 09/09/2017   Procedure: LEFT HEART CATH AND CORONARY ANGIOGRAPHY;  Surgeon: Teodoro Spray, MD;  Location: [facility];  Service: Cardiovascular;  Laterality: N/A;  . UMBILICAL HERNIA REPAIR  1990s  . V TACH ABLATION  06/10/2018  . V TACH ABLATION N/A 06/10/2018   Procedure: V TACH ABLATION;  Surgeon: Thompson Grayer, MD;  Location: [facility];  Service: Cardiovascular;  Laterality: N/A;  . VASECTOMY       A IV Location/Drains/Wounds Patient Lines/Drains/Airways Status   Active Line/Drains/Airways    Name:   Placement date:   Placement time:   Site:   Days:   Peripheral IV 04/18/19 Left Antecubital   04/18/19    1930    Antecubital   less than 1          Intake/Output Last 24 hours No intake or output data in the 24 hours ending 04/18/19 2348  Labs/Imaging Results for orders placed or performed during the hospital encounter of 04/18/19 (from the past 48 hour(s))  CBC     Status: Abnormal   Collection Time: 04/18/19  7:26 PM  Result Value Ref Range   WBC 11.1 (H) 4.0 - 10.5 K/uL   RBC 5.48 4.22 - 5.81 MIL/uL   Hemoglobin 15.5 13.0 -  17.0 g/dL   HCT 48.6 39.0 - 52.0 %   MCV 88.7 80.0 - 100.0 fL   MCH 28.3 26.0 - 34.0 pg   MCHC 31.9 30.0 - 36.0 g/dL   RDW 18.4 (H) 11.5 - 15.5 %   Platelets 294 150 - 400 K/uL   nRBC 0.0 0.0 - 0.2 %    Comment: Performed at [facility], [address] [place], [place] [address]  Basic metabolic panel     Status: Abnormal   Collection Time: 04/18/19  7:26 PM  Result Value Ref Range   Sodium 135 135 - 145 mmol/L   Potassium 4.2 3.5 - 5.1 mmol/L   Chloride 95 (L) 98 - 111 mmol/L   CO2 27 22 - 32 mmol/L   Glucose, Bld 96 70 - 99 mg/dL   BUN 53 (H) 6 - 20 mg/dL   Creatinine, Ser 1.91 (H) 0.61 - 1.24 mg/dL    Calcium 9.5 8.9 - 10.3 mg/dL   GFR calc non Af Amer 39 (L) >60 mL/min   GFR calc Af Amer 45 (L) >60 mL/min   Anion gap 13 5 - 15    Comment: Performed at [facility], [address] [place], [place] [address]  Hemoglobin and hematocrit, blood     Status: None   Collection Time: 04/18/19 10:25 PM  Result Value Ref Range   Hemoglobin 14.6 13.0 - 17.0 g/dL   HCT 45.9 39.0 - 52.0 %    Comment: Performed at [facility], [address] [place], [place] [address]  Type and screen     Status: None (Preliminary result)   Collection Time: 04/18/19 11:29 PM  Result Value Ref Range   ABO/RH(D) PENDING    Antibody Screen PENDING    Sample Expiration      04/21/2019 Performed at [facility], [address] [place], [place] [address]    No results found.  Pending Labs [employer] (From admission, onward)    Start     Ordered   Signed and Occupational hygienist morning,   R     Signed and Held   Signed and Held  CBC  Tomorrow morning,   R     Signed and Held          Vitals/Pain Today's Vitals   04/18/19 2129 04/18/19 2145 04/18/19 2200 04/18/19 2330  BP:  97/77 100/78 97/74  Pulse: 65 62 64 65  Resp:    17  Temp:      TempSrc:      SpO2: 100% 99% 96% 98%  Weight:      Height:      PainSc:        Isolation Precautions No active isolations  Medications Medications - No data to display  Mobility walks Low fall risk   Focused Assessments Gastrointestinal   R Recommendations: See Admitting Provider Note  Report given to:   Additional Notes:

## 2019-04-18 NOTE — ED Notes (Signed)
Patient given meal per MD Jannifer Franklin order. Patient instructed he is only allowed to eat until midnight. Patient verbalized understanding. Patient eating sandwich when this RN left room.

## 2019-04-18 NOTE — ED Triage Notes (Signed)
Per patient's report, patient experienced blood in toilet after bowel movements today and yesterday. Patient is on blood thinners.

## 2019-04-18 NOTE — H&P (Signed)
Stephen [name] at [place] NAME: Stephen [name]    MR#:  [id number]  DATE OF BIRTH:  [birth year]  DATE OF ADMISSION:  04/18/2019  PRIMARY CARE PHYSICIAN: Valerie Roys, DO   REQUESTING/REFERRING PHYSICIAN: Alfred Levins, MD  CHIEF COMPLAINT:   Chief Complaint  Patient presents with  . Rectal Bleeding    HISTORY OF PRESENT ILLNESS:  Stephen [name]  is a 53 y.o. male who presents with chief complaint as above.  Patient presents to the ED with a complaint of rectal bleeding.  He states that he has been severely constipated lately, and is passed some rather large stool balls.  He states that starting yesterday after passing 1 of the stool balls he began to have some rectal bleeding.  That occurred twice yesterday, and twice again today.  He was supposed to have an appointment with Dr. Vira [name] from gastroenterology in 3 days, but given the bleeding he felt like he needed to be evaluated.  Work-up in the ED is largely within normal limits, except for 1 point drop in hemoglobin over the course of several hours while here.  Due to the same hospitalist were called for admission and further evaluation.  PAST MEDICAL HISTORY:   Past Medical History:  Diagnosis Date  . AICD (automatic cardioverter/defibrillator) present   . AKI (acute kidney injury) ([place]) 12/24/2017  . Arrhythmia   . Arthritis    "lower back, knees" (06/10/2018)  . Cardiac arrest ([place]) 12/23/2017   Brief V-fib arrest  . Chest pain 09/08/2017  . CHF (congestive heart failure) (HCC)    nonischemic cardiomyopathy, EF 25%  . Gout    "on daily RX" (06/10/2018)  . Headache    "q couple months" (06/10/2018)  . High cholesterol   . History of kidney stones   . Hypertension   . Influenza A 12/24/2017  . NICM (nonischemic cardiomyopathy) ([place])   . Pneumonia 12/20/2017  . TIA (transient ischemic attack) 06/21/2016   "still affects my memory a little bit" (06/10/2018)     PAST SURGICAL  HISTORY:   Past Surgical History:  Procedure Laterality Date  . EXTERNAL FIXATION LEG Right ~ 2000   "was going bowlegged; had to brake my leg to fix it"  . HERNIA REPAIR    . ICD IMPLANT N/A 12/30/2017   Procedure: ICD IMPLANT;  Surgeon: Deboraha Sprang, MD;  Location: [facility] [facility];  Service: Cardiovascular;  Laterality: N/A;  . LEFT HEART CATH AND CORONARY ANGIOGRAPHY N/A 09/09/2017   Procedure: LEFT HEART CATH AND CORONARY ANGIOGRAPHY;  Surgeon: Teodoro Spray, MD;  Location: [facility] [place] [facility];  Service: Cardiovascular;  Laterality: N/A;  . UMBILICAL HERNIA REPAIR  1990s  . V TACH ABLATION  06/10/2018  . V TACH ABLATION N/A 06/10/2018   Procedure: V TACH ABLATION;  Surgeon: Thompson Grayer, MD;  Location: [facility];  Service: Cardiovascular;  Laterality: N/A;  . VASECTOMY       SOCIAL HISTORY:   Social History   Tobacco Use  . Smoking status: Former Smoker    Packs/day: 0.33    Years: 33.00    Pack years: 10.89    Types: Cigarettes    Last attempt to quit: 02/20/2016    Years since quitting: 3.1  . Smokeless tobacco: Never Used  Substance Use Topics  . Alcohol use: Yes    Alcohol/week: 0.0 standard drinks    Comment: 06/10/2018 "beer once/month; if that"  FAMILY HISTORY:   Family History  Problem Relation Age of Onset  . Hypertension Mother   . Heart failure Mother   . Hypertension Father   . CAD Father   . Heart attack Father      DRUG ALLERGIES:   Allergies  Allergen Reactions  . Lisinopril Cough    MEDICATIONS AT HOME:   Prior to Admission medications   Medication Sig Start Date End Date Taking? Authorizing Provider  acetaminophen (TYLENOL) 325 MG tablet Take 2 tablets (650 mg total) by mouth every 6 (six) hours as needed. Patient taking differently: Take 325-650 mg by mouth every 6 (six) hours as needed for moderate pain.  03/19/18  Yes Carrie Mew, MD  albuterol (PROVENTIL HFA;VENTOLIN HFA) 108 (90 Base) MCG/ACT inhaler  Inhale 2 puffs into the lungs every 4 (four) hours as needed for wheezing or shortness of breath. 09/19/18  Yes Johnson, Megan P, DO  allopurinol (ZYLOPRIM) 100 MG tablet Take 2 tablets (200 mg total) by mouth daily. Patient taking differently: Take 200 mg by mouth daily as needed.  10/19/18  Yes Johnson, Megan P, DO  benzonatate (TESSALON) 200 MG capsule Take 1 capsule (200 mg total) by mouth 2 (two) times daily as needed for cough. 03/17/19  Yes Cannady, Jolene T, NP  CAMPHOR-EUCALYPTUS-MENTHOL EX Apply 1 application topically as needed (congestion).   Yes [provider]  colchicine 0.6 MG tablet Take 1 tablet (0.6 mg total) by mouth daily as needed (gout flare). 09/03/18  Yes Johnson, Megan P, DO  cyclobenzaprine (FLEXERIL) 5 MG tablet Take 1 tablet (5 mg total) by mouth 3 (three) times daily as needed (jaw pain). 11/24/18  Yes Nance Pear, MD  diclofenac sodium (VOLTAREN) 1 % GEL Apply 2 g topically 4 (four) times daily as needed (pain).    Yes [provider]  famotidine (PEPCID) 20 MG tablet Take 20 mg by mouth daily as needed for heartburn or indigestion.   Yes [provider]  fluticasone (FLONASE) 50 MCG/ACT nasal spray Place 2 sprays into both nostrils daily. Patient taking differently: Place 2 sprays into both nostrils daily as needed for allergies.  06/20/17  Yes [name], MD  guaiFENesin (ROBITUSSIN) 100 MG/5ML SOLN Take 5 mLs (100 mg total) by mouth every 4 (four) hours as needed for cough or to loosen phlegm. 03/17/19  Yes Cannady, Jolene T, NP  Menthol, Topical Analgesic, (ICY HOT EX) Apply 1 application topically daily as needed (pain).   Yes [provider]  metolazone (ZAROXOLYN) 2.5 MG tablet Take 2.5 mg by mouth as needed.  09/05/18  Yes [provider]  nitroGLYCERIN (NITROSTAT) 0.4 MG SL tablet Place 1 tablet (0.4 mg total) under the tongue every 5 (five) minutes as needed for chest pain. 02/19/18  Yes Bensimhon, Shaune Pascal, MD   ondansetron (ZOFRAN ODT) 4 MG disintegrating tablet Take 1 tablet (4 mg total) by mouth every 8 (eight) hours as needed for nausea or vomiting. 01/24/19  Yes Harvest Dark, MD  promethazine (PHENERGAN) 12.5 MG tablet Take 1 tablet (12.5 mg total) by mouth every 6 (six) hours as needed for nausea or vomiting. 11/24/18  Yes Nance Pear, MD  sodium chloride (OCEAN) 0.65 % SOLN nasal spray Place 1 spray into both nostrils as needed for congestion.   Yes [provider]  amiodarone (PACERONE) 200 MG tablet Take 2 tablets (400 mg total) by mouth daily. 04/15/19   Vaughan Basta, MD  aspirin EC 81 MG EC tablet Take 1 tablet (81  mg total) by mouth daily. 01/01/18   Daune Perch, NP  atorvastatin (LIPITOR) 40 MG tablet Take 80 mg by mouth daily at 6 PM.  10/29/18   [provider]  carvedilol (COREG) 3.125 MG tablet Take 1 tablet (3.125 mg total) by mouth daily. 04/15/19   Vaughan Basta, MD  clopidogrel (PLAVIX) 75 MG tablet TAKE ONE TABLET BY MOUTH EVERY DAY Patient taking differently: Take 75 mg by mouth daily.  02/13/19   Bensimhon, Shaune Pascal, MD  fluticasone-salmeterol (ADVAIR HFA) 453-64 MCG/ACT inhaler Inhale 2 puffs into the lungs 2 (two) times daily. 08/12/18   [name], MD  methimazole (TAPAZOLE) 10 MG tablet Take 4 tablets (40 mg total) by mouth daily. 12/09/18   Mayo, Pete Pelt, MD  Multiple Vitamin (MULTIVITAMIN WITH MINERALS) TABS tablet Take 1 tablet by mouth daily.    [provider]  pantoprazole (PROTONIX) 40 MG tablet Take 1 tablet (40 mg total) by mouth 2 (two) times daily. 03/01/19   Fritzi Mandes, MD  potassium chloride SA (K-DUR,KLOR-CON) 20 MEQ tablet Take 1 tablet (20 mEq total) by mouth 2 (two) times daily. Patient taking differently: Take 20 mEq by mouth 4 (four) times daily.  03/01/19   Fritzi Mandes, MD  predniSONE (DELTASONE) 20 MG tablet Take 1 tablet (20 mg total) by mouth daily with breakfast. 04/02/19   Park Liter P, DO   sennosides-docusate sodium (SENOKOT-S) 8.6-50 MG tablet Take 1 tablet by mouth daily.    [provider]  spironolactone (ALDACTONE) 25 MG tablet Take 0.5 tablets (12.5 mg total) by mouth daily. 04/15/19   Vaughan Basta, MD  Tiotropium Bromide Monohydrate (SPIRIVA RESPIMAT) 2.5 MCG/ACT AERS Inhale 1 puff into the lungs daily. 08/12/18   [name], MD  torsemide (DEMADEX) 20 MG tablet Take 1 tablet (20 mg total) by mouth 2 (two) times daily. 04/15/19   Vaughan Basta, MD    REVIEW OF SYSTEMS:  Review of Systems  Constitutional: Negative for chills, fever, malaise/fatigue and weight loss.  HENT: Negative for ear pain, hearing loss and tinnitus.   Eyes: Negative for blurred vision, double vision, pain and redness.  Respiratory: Negative for cough, hemoptysis and shortness of breath.   Cardiovascular: Negative for chest pain, palpitations, orthopnea and leg swelling.  Gastrointestinal: Positive for blood in stool. Negative for abdominal pain, constipation, diarrhea, nausea and vomiting.  Genitourinary: Negative for dysuria, frequency and hematuria.  Musculoskeletal: Negative for back pain, joint pain and neck pain.  Skin:       No acne, rash, or lesions  Neurological: Negative for dizziness, tremors, focal weakness and weakness.  Endo/Heme/Allergies: Negative for polydipsia. Does not bruise/bleed easily.  Psychiatric/Behavioral: Negative for depression. The patient is not nervous/anxious and does not have insomnia.      VITAL SIGNS:   Vitals:   04/18/19 2101 04/18/19 2129 04/18/19 2145 04/18/19 2200  BP:   97/77 100/78  Pulse: 66 65 62 64  Resp:      Temp:      TempSrc:      SpO2: 99% 100% 99% 96%  Weight:      Height:       Wt Readings from Last 3 Encounters:  04/18/19 100.7 kg  04/15/19 101.2 kg  04/02/19 102.5 kg    PHYSICAL EXAMINATION:  Physical Exam  Vitals reviewed. Constitutional: He is oriented to person, place, and time. He appears  well-developed and well-nourished. No distress.  HENT:  Head: Normocephalic and atraumatic.  Mouth/Throat: Oropharynx is clear and moist.  Eyes: Pupils are equal, round, and reactive to light. Conjunctivae and EOM are normal. No scleral icterus.  Neck: Normal range of motion. Neck supple. No JVD present. No thyromegaly present.  Cardiovascular: Normal rate, regular rhythm and intact distal pulses. Exam reveals no gallop and no friction rub.  No murmur heard. Respiratory: Effort normal and breath sounds normal. No respiratory distress. He has no wheezes. He has no rales.  GI: Soft. Bowel sounds are normal. He exhibits no distension. There is no abdominal tenderness.  Musculoskeletal: Normal range of motion.        General: No edema.     Comments: No arthritis, no gout  Lymphadenopathy:    He has no cervical adenopathy.  Neurological: He is alert and oriented to person, place, and time. No cranial nerve deficit.  No dysarthria, no aphasia  Skin: Skin is warm and dry. No rash noted. No erythema.  Psychiatric: He has a normal mood and affect. His behavior is normal. Judgment and thought content normal.    LABORATORY PANEL:   CBC Recent Labs  Lab 04/18/19 1926 04/18/19 2225  WBC 11.1*  --   HGB 15.5 14.6  HCT 48.6 45.9  PLT 294  --    ------------------------------------------------------------------------------------------------------------------  Chemistries  Recent Labs  Lab 04/18/19 1926  NA 135  K 4.2  CL 95*  CO2 27  GLUCOSE 96  BUN 53*  CREATININE 1.91*  CALCIUM 9.5   ------------------------------------------------------------------------------------------------------------------  Cardiac Enzymes No results for input(s): TROPONINI in the last 168 hours. ------------------------------------------------------------------------------------------------------------------  RADIOLOGY:  No results found.  EKG:   Orders placed or performed during the hospital  encounter of 04/07/19  . EKG 12-Lead  . EKG 12-Lead    IMPRESSION AND PLAN:  Principal Problem:   Lower GI bleed -hemoglobin fell on recheck in the ED, but is still at a good value about 14.  We will recheck his hemoglobin value in the morning.  If it is fallen further he will need a GI consult to evaluate the possibility of significant lower GI bleed.  If it is stable he could potentially be discharged with follow-up with GI as scheduled Active Problems:   Chronic systolic congestive heart failure ([place]) -continue home meds   COPD (chronic obstructive pulmonary disease) ([place]) -home dose inhalers   HTN (hypertension) -home dose antihypertensives   CKD (chronic kidney disease) stage 3, GFR 30-59 ml/min (H[facility]) -at baseline, avoid nephrotoxins and monitor   Hyperlipidemia -home dose antilipid  Med rec is not yet completed, patient will need home meds ordered once med rec has been completed  Chart review performed and case discussed with ED provider. Labs, imaging and/or ECG reviewed by provider and discussed with patient/family. Management plans discussed with the patient and/or family.  DVT PROPHYLAXIS: Mechanical only  GI PROPHYLAXIS:  PPI   ADMISSION STATUS: Observation  CODE STATUS: Full Code Status History    Date Active Date Inactive Code Status Order ID Comments User Context   04/07/2019 1426 04/15/2019 2146 Full Code [id number]  Hillary Bow, MD ED   02/18/2019 0021 03/02/2019 0025 Full Code [id number]  [name], MD ED   02/13/2019 1451 02/14/2019 2116 Full Code [id number]  Hillary Bow, MD ED   12/27/2018 0600 12/28/2018 1829 Partial Code [id number]  Harrie Foreman, MD Inpatient   12/04/2018 0437 12/10/2018 2037 Partial Code [id number]  Bradly Bienenstock, NP Inpatient   12/04/2018 0147 12/04/2018 0437 Full Code [id number]  Henreitta Leber, MD ED   08/19/2018 2248 08/20/2018 2258  Full Code [id number]  Amelia Jo, MD Inpatient   06/30/2018 1757 07/06/2018 1647 Full Code [id number]   [name], MD Inpatient   06/10/2018 1606 06/11/2018 1537 Full Code [id number]  Thompson Grayer, MD Inpatient   12/23/2017 1153 12/31/2017 1729 Full Code [id number]  Ilean China Inpatient   12/20/2017 1851 12/23/2017 1110 Full Code [id number]  [name], MD Inpatient   09/08/2017 0644 09/10/2017 2127 Full Code [id number]  Harrie Foreman, MD Inpatient   07/29/2017 1044 07/31/2017 2152 Full Code [id number]  Henreitta Leber, MD Inpatient   06/19/2017 0027 06/20/2017 2114 Full Code [id number]  Lance Coon, MD ED   05/20/2017 1226 05/21/2017 1759 Full Code [id number]  [name], MD Inpatient   06/22/2016 0430 06/22/2016 2150 Full Code [id number]  Quintella Baton, MD Inpatient   06/04/2016 1843 06/06/2016 1815 Full Code [id number]  [name], MD Inpatient      TOTAL TIME TAKING CARE OF THIS PATIENT: 40 minutes.   Ethlyn Daniels 04/18/2019, 11:32 PM  [facility]  Office  [phone]  CC: Primary care physician; Valerie Roys, DO  Note:  This document was prepared using Dragon voice recognition software and may include unintentional dictation errors.

## 2019-04-18 NOTE — ED Provider Notes (Signed)
[facility] Emergency Department Provider Note  ____________________________________________  Time seen: Approximately 7:29 PM  I have reviewed the triage vital signs and the nursing notes.   HISTORY  Chief Complaint Rectal Bleeding   HPI EMRAH ARIOLA is a 53 y.o. male history of V. fib arrest status post AICD, CAD on Plavix, CHF, hypertension, hyperlipidemia who presents for evaluation of rectal bleeding.  Patient was recently hospitalized for CHF exacerbation.  He reports that while in the hospital he became very constipated.  Has been taking stool softener at home.  Yesterday he reports having a bowel movement with a stool the size of a baseball.  He reports noticing a lot of blood after he passed the stool.  So far he reports total of 4 episodes of hardened stools followed by large amount of blood in the toilet.  The stool was normal color with no melena.  No nausea or vomiting, no abdominal pain, no dizziness or chest pain.  No prior history of GI bleed.  Past Medical History:  Diagnosis Date  . AICD (automatic cardioverter/defibrillator) present   . AKI (acute kidney injury) ([place]) 12/24/2017  . Arrhythmia   . Arthritis    "lower back, knees" (06/10/2018)  . Cardiac arrest ([place]) 12/23/2017   Brief V-fib arrest  . Chest pain 09/08/2017  . CHF (congestive heart failure) (HCC)    nonischemic cardiomyopathy, EF 25%  . Gout    "on daily RX" (06/10/2018)  . Headache    "q couple months" (06/10/2018)  . High cholesterol   . History of kidney stones   . Hypertension   . Influenza A 12/24/2017  . NICM (nonischemic cardiomyopathy) ([place])   . Pneumonia 12/20/2017  . TIA (transient ischemic attack) 06/21/2016   "still affects my memory a little bit" (06/10/2018)    Patient Active Problem List   Diagnosis Date Noted  . CHF (congestive heart failure) ([place]) 04/07/2019  . Cough 03/17/2019  . 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 HTN  (hypertension) 11/20/2018  . Hypokalemia 08/19/2018  . Syncope 06/30/2018  . CKD (chronic kidney disease) stage 3, GFR 30-59 ml/min (HCC) 05/13/2018  . Hyperlipidemia 05/13/2018  . ED (erectile dysfunction) 05/13/2018  . Osteoarthritis of right knee 04/30/2018  . Gout 03/26/2018  . VF (ventricular fibrillation) ([place])   . Nonischemic cardiomyopathy ([place])   . Cardiogenic shock ([place]) 12/24/2017  . 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 History of non-ST elevation myocardial infarction (NSTEMI) 09/08/2017  . Bradycardia 08/30/2017  . Ventricular tachycardia ([place]) 07/29/2017  . COPD (chronic obstructive pulmonary disease) ([place]) 07/04/2017  . TIA (transient ischemic attack) 06/22/2016  . Benign hypertensive renal disease 06/22/2016  . Chronic systolic congestive heart failure ([place]) 01/20/2016  . Cardiomyopathy ([place]) 01/20/2016    Past Surgical History:  Procedure Laterality Date  . EXTERNAL FIXATION LEG Right ~ 2000   "was going bowlegged; had to brake my leg to fix it"  . HERNIA REPAIR    . ICD IMPLANT N/A 12/30/2017   Procedure: ICD IMPLANT;  Surgeon: Deboraha Sprang, MD;  Location: [facility];  Service: Cardiovascular;  Laterality: N/A;  . LEFT HEART CATH AND CORONARY ANGIOGRAPHY N/A 09/09/2017   Procedure: LEFT HEART CATH AND CORONARY ANGIOGRAPHY;  Surgeon: Teodoro Spray, MD;  Location: [facility];  Service: Cardiovascular;  Laterality: N/A;  . UMBILICAL HERNIA REPAIR  1990s  . V TACH ABLATION  06/10/2018  . V TACH ABLATION N/A 06/10/2018   Procedure: V [facility]  ABLATION;  Surgeon: Thompson Grayer, MD;  Location: [place];  Service: Cardiovascular;  Laterality: N/A;  . VASECTOMY      Prior to Admission medications   Medication Sig Start Date End Date Taking? Authorizing Provider  acetaminophen (TYLENOL) 325 MG tablet Take 2 tablets (650 mg total) by mouth every 6 (six) hours as needed. Patient taking differently: Take 325-650 mg by mouth every 6 (six) hours as needed  for moderate pain.  03/19/18   Carrie Mew, MD  albuterol (PROVENTIL HFA;VENTOLIN HFA) 108 (90 Base) MCG/ACT inhaler Inhale 2 puffs into the lungs every 4 (four) hours as needed for wheezing or shortness of breath. 09/19/18   Johnson, Megan P, DO  allopurinol (ZYLOPRIM) 100 MG tablet Take 2 tablets (200 mg total) by mouth daily. Patient taking differently: Take 200 mg by mouth daily as needed.  10/19/18   Johnson, Megan P, DO  amiodarone (PACERONE) 200 MG tablet Take 2 tablets (400 mg total) by mouth daily. 04/15/19   Vaughan Basta, MD  aspirin EC 81 MG EC tablet Take 1 tablet (81 mg total) by mouth daily. 01/01/18   Daune Perch, NP  atorvastatin (LIPITOR) 40 MG tablet Take 80 mg by mouth daily at 6 PM.  10/29/18   [provider]  benzonatate (TESSALON) 200 MG capsule Take 1 capsule (200 mg total) by mouth 2 (two) times daily as needed for cough. 03/17/19   Cannady, Henrine Screws T, NP  CAMPHOR-EUCALYPTUS-MENTHOL EX Apply 1 application topically as needed (congestion).    [provider]  carvedilol (COREG) 3.125 MG tablet Take 1 tablet (3.125 mg total) by mouth daily. 04/15/19   Vaughan Basta, MD  clopidogrel (PLAVIX) 75 MG tablet TAKE ONE TABLET BY MOUTH EVERY DAY Patient taking differently: Take 75 mg by mouth daily.  02/13/19   Bensimhon, Shaune Pascal, MD  colchicine 0.6 MG tablet Take 1 tablet (0.6 mg total) by mouth daily as needed (gout flare). 09/03/18   Johnson, Megan P, DO  cyclobenzaprine (FLEXERIL) 5 MG tablet Take 1 tablet (5 mg total) by mouth 3 (three) times daily as needed (jaw pain). 11/24/18   Nance Pear, MD  diclofenac sodium (VOLTAREN) 1 % GEL Apply 2 g topically 4 (four) times daily as needed (pain).     [provider]  famotidine (PEPCID) 20 MG tablet Take 1 tablet (20 mg total) by mouth 2 (two) times daily for 15 days. Patient taking differently: Take 20 mg by mouth daily as needed.  02/16/19 04/07/19  Arta Silence, MD  fluticasone  (FLONASE) 50 MCG/ACT nasal spray Place 2 sprays into both nostrils daily. Patient taking differently: Place 2 sprays into both nostrils daily as needed for allergies.  06/20/17   [name], MD  fluticasone-salmeterol (ADVAIR HFA) [id number] MCG/ACT inhaler Inhale 2 puffs into the lungs 2 (two) times daily. 08/12/18   [name], MD  guaiFENesin (ROBITUSSIN) 100 MG/5ML SOLN Take 5 mLs (100 mg total) by mouth every 4 (four) hours as needed for cough or to loosen phlegm. 03/17/19   Cannady, Henrine Screws T, NP  Menthol, Topical Analgesic, (ICY HOT EX) Apply 1 application topically daily as needed (pain).    [provider]  methimazole (TAPAZOLE) 10 MG tablet Take 4 tablets (40 mg total) by mouth daily. 12/09/18   Mayo, Pete Pelt, MD  metolazone (ZAROXOLYN) 2.5 MG tablet Take 2.5 mg by mouth as needed.  09/05/18   [provider]  Multiple Vitamin (MULTIVITAMIN WITH MINERALS) TABS tablet Take 1 tablet by  mouth daily.    [provider]  nitroGLYCERIN (NITROSTAT) 0.4 MG SL tablet Place 1 tablet (0.4 mg total) under the tongue every 5 (five) minutes as needed for chest pain. 02/19/18   Bensimhon, Shaune Pascal, MD  ondansetron (ZOFRAN ODT) 4 MG disintegrating tablet Take 1 tablet (4 mg total) by mouth every 8 (eight) hours as needed for nausea or vomiting. 01/24/19   Harvest Dark, MD  pantoprazole (PROTONIX) 40 MG tablet Take 1 tablet (40 mg total) by mouth 2 (two) times daily. 03/01/19   Fritzi Mandes, MD  potassium chloride SA (K-DUR,KLOR-CON) 20 MEQ tablet Take 1 tablet (20 mEq total) by mouth 2 (two) times daily. Patient taking differently: Take 20 mEq by mouth 4 (four) times daily.  03/01/19   Fritzi Mandes, MD  predniSONE (DELTASONE) 20 MG tablet Take 1 tablet (20 mg total) by mouth daily with breakfast. 04/02/19   Park Liter P, DO  promethazine (PHENERGAN) 12.5 MG tablet Take 1 tablet (12.5 mg total) by mouth every 6 (six) hours as needed for nausea or vomiting. 11/24/18   Nance Pear, MD  sennosides-docusate sodium (SENOKOT-S) 8.6-50 MG tablet Take 1 tablet by mouth daily.    [provider]  sodium chloride (OCEAN) 0.65 % SOLN nasal spray Place 1 spray into both nostrils as needed for congestion.    [provider]  spironolactone (ALDACTONE) 25 MG tablet Take 0.5 tablets (12.5 mg total) by mouth daily. 04/15/19   Vaughan Basta, MD  Tiotropium Bromide Monohydrate (SPIRIVA RESPIMAT) 2.5 MCG/ACT AERS Inhale 1 puff into the lungs daily. 08/12/18   [name], MD  torsemide (DEMADEX) 20 MG tablet Take 1 tablet (20 mg total) by mouth 2 (two) times daily. 04/15/19   Vaughan Basta, MD    Allergies Lisinopril  Family History  Problem Relation Age of Onset  . Hypertension Mother   . Heart failure Mother   . Hypertension Father   . CAD Father   . Heart attack Father     Social History Social History   Tobacco Use  . Smoking status: Former Smoker    Packs/day: 0.33    Years: 33.00    Pack years: 10.89    Types: Cigarettes    Last attempt to quit: 02/20/2016    Years since quitting: 3.1  . Smokeless tobacco: Never Used  Substance Use Topics  . Alcohol use: Yes    Alcohol/week: 0.0 standard drinks    Comment: 06/10/2018 "beer once/month; if that"  . Drug use: Never    Review of Systems  Constitutional: Negative for fever. Eyes: Negative for visual changes. ENT: Negative for sore throat. Neck: No neck pain  Cardiovascular: Negative for chest pain. Respiratory: Negative for shortness of breath. Gastrointestinal: Negative for abdominal pain, vomiting or diarrhea. + rectal pain and constipation Genitourinary: Negative for dysuria. Musculoskeletal: Negative for back pain. Skin: Negative for rash. Neurological: Negative for headaches, weakness or numbness. Psych: No SI or HI  ____________________________________________   PHYSICAL EXAM:  VITAL SIGNS: ED Triage Vitals  Enc Vitals Group     BP 04/18/19 1906 117/73      Pulse Rate 04/18/19 1906 (!) 35     Resp 04/18/19 1906 (!) 22     Temp 04/18/19 1906 97.8 F (36.6 C)     Temp Source 04/18/19 1906 Oral     SpO2 04/18/19 1906 100 %     Weight 04/18/19 1911 222 lb (100.7 kg)     Height 04/18/19 1911 6\' 1"  (1.854  m)     Head Circumference --      Peak Flow --      Pain Score 04/18/19 1910 7     Pain Loc --      Pain Edu? --      Excl. in [place]? --     Constitutional: Alert and oriented. Well appearing and in no apparent distress. HEENT:      Head: Normocephalic and atraumatic.         Eyes: Conjunctivae are normal. Sclera is non-icteric.       Mouth/Throat: Mucous membranes are moist.       Neck: Supple with no signs of meningismus. Cardiovascular: Regular rate and rhythm. No murmurs, gallops, or rubs. 2+ symmetrical distal pulses are present in all extremities. No JVD. Respiratory: Normal respiratory effort. Lungs are clear to auscultation bilaterally. No wheezes, crackles, or rhonchi.  Gastrointestinal: Soft, non tender, and non distended with positive bowel sounds. No rebound or guarding. Genitourinary: Rectal exam showing no anal fissure, no hemorrhoid, normal stool color, and gross blood in the rectal vault. Musculoskeletal: Nontender with normal range of motion in all extremities. No edema, cyanosis, or erythema of extremities. Neurologic: Normal speech and language. Face is symmetric. Moving all extremities. No gross focal neurologic deficits are appreciated. Skin: Skin is warm, dry and intact. No rash noted. Psychiatric: Mood and affect are normal. Speech and behavior are normal.  ____________________________________________   LABS (all labs ordered are listed, but only abnormal results are displayed)  Labs Reviewed  CBC - Abnormal; Notable for the following components:      Result Value   WBC 11.1 (*)    RDW 18.4 (*)    All other components within normal limits  BASIC METABOLIC PANEL - Abnormal; Notable for the following  components:   Chloride 95 (*)    BUN 53 (*)    Creatinine, Ser 1.91 (*)    GFR calc non Af Amer 39 (*)    GFR calc Af Amer 45 (*)    All other components within normal limits  HEMOGLOBIN AND HEMATOCRIT, BLOOD   ____________________________________________  EKG  none  ____________________________________________  RADIOLOGY  none  ____________________________________________   PROCEDURES  Procedure(s) performed: None Procedures Critical Care performed:  None ____________________________________________   INITIAL IMPRESSION / ASSESSMENT AND PLAN / ED COURSE  53 y.o. male history of V. fib arrest status post AICD, CAD on Plavix, CHF, hypertension, hyperlipidemia who presents for evaluation of 4 episodes of bleeding per rectum after being constipated for 1 week and having very large and hard stool. On exam there are no external hemorrhoids, no anal fissure, patient has blood in the rectal vault with normal colored stool.  Labs pending. Ddx internal hemorrhoid, diverticular bleed, AVM.  Clinical Course as of Apr 18 2239  Sat Apr 18, 2019  2239 Repeat hgb dropped 1 point in 3 hours. Patient on plavix, rectal exam with gross blood.  Will admit for obs.   [CV]    Clinical Course User Index [CV] Alfred Levins [place], MD     As part of my medical decision making, I reviewed the following data within the [place] notes reviewed and incorporated, Labs reviewed , Old chart reviewed, Notes from prior ED visits and  Controlled Substance Database    Pertinent labs & imaging results that were available during my care of the patient were reviewed by me and considered in my medical decision making (see chart for details).    ____________________________________________  FINAL CLINICAL IMPRESSION(S) / ED DIAGNOSES  Final diagnoses:  Lower GI bleed      NEW MEDICATIONS STARTED DURING THIS VISIT:  ED Discharge Orders    None       Note:   This document was prepared using Dragon voice recognition software and may include unintentional dictation errors.    Alfred Levins, [place], MD 04/18/19 2240

## 2019-04-19 ENCOUNTER — Encounter: Payer: Self-pay | Admitting: *Deleted

## 2019-04-19 LAB — CBC
HCT: 42.8 % (ref 39.0–52.0)
Hemoglobin: 14.1 g/dL (ref 13.0–17.0)
MCH: 28.8 pg (ref 26.0–34.0)
MCHC: 32.9 g/dL (ref 30.0–36.0)
MCV: 87.3 fL (ref 80.0–100.0)
Platelets: 255 10*3/uL (ref 150–400)
RBC: 4.9 MIL/uL (ref 4.22–5.81)
RDW: 17.7 % — ABNORMAL HIGH (ref 11.5–15.5)
WBC: 11.5 10*3/uL — ABNORMAL HIGH (ref 4.0–10.5)
nRBC: 0 % (ref 0.0–0.2)

## 2019-04-19 LAB — TYPE AND SCREEN
ABO/RH(D): O POS
Antibody Screen: NEGATIVE

## 2019-04-19 LAB — BASIC METABOLIC PANEL
Anion gap: 10 (ref 5–15)
BUN: 50 mg/dL — ABNORMAL HIGH (ref 6–20)
CO2: 26 mmol/L (ref 22–32)
Calcium: 9.1 mg/dL (ref 8.9–10.3)
Chloride: 104 mmol/L (ref 98–111)
Creatinine, Ser: 1.7 mg/dL — ABNORMAL HIGH (ref 0.61–1.24)
GFR calc Af Amer: 52 mL/min — ABNORMAL LOW (ref >60)
GFR calc non Af Amer: 45 mL/min — ABNORMAL LOW (ref >60)
Glucose, Bld: 95 mg/dL (ref 70–99)
Potassium: 3.6 mmol/L (ref 3.5–5.1)
Sodium: 140 mmol/L (ref 135–145)

## 2019-04-19 MED ORDER — ONDANSETRON HCL 4 MG PO TABS: 4.0000 mg | ORAL_TABLET | Freq: Four times a day (QID) | ORAL | Status: DC | PRN

## 2019-04-19 MED ORDER — CLOPIDOGREL BISULFATE 75 MG PO TABS
75.0000 mg | ORAL_TABLET | Freq: Every day | ORAL | Status: DC
  Administered 2019-04-19: 09:00:00 75 mg via ORAL
  Filled 2019-04-19: qty 1

## 2019-04-19 MED ORDER — POLYETHYLENE GLYCOL 3350 17 G PO PACK: 17.0000 g | PACK | Freq: Every day | ORAL | 0 refills | Status: DC

## 2019-04-19 MED ORDER — ACETAMINOPHEN 650 MG RE SUPP: 650.0000 mg | Freq: Four times a day (QID) | RECTAL | Status: DC | PRN

## 2019-04-19 MED ORDER — MOMETASONE FURO-FORMOTEROL FUM 200-5 MCG/ACT IN AERO
2.0000 | INHALATION_SPRAY | Freq: Two times a day (BID) | RESPIRATORY_TRACT | Status: DC
  Administered 2019-04-19: 2 via RESPIRATORY_TRACT
  Filled 2019-04-19: qty 8.8

## 2019-04-19 MED ORDER — TIOTROPIUM BROMIDE MONOHYDRATE 18 MCG IN CAPS
1.0000 | ORAL_CAPSULE | Freq: Every day | RESPIRATORY_TRACT | Status: DC
  Administered 2019-04-19: 09:00:00 18 ug via RESPIRATORY_TRACT
  Filled 2019-04-19: qty 5

## 2019-04-19 MED ORDER — ASPIRIN EC 81 MG PO TBEC
81.0000 mg | DELAYED_RELEASE_TABLET | Freq: Every day | ORAL | Status: DC
  Administered 2019-04-19: 81 mg via ORAL
  Filled 2019-04-19: qty 1

## 2019-04-19 MED ORDER — ACETAMINOPHEN 325 MG PO TABS: 650.0000 mg | ORAL_TABLET | Freq: Four times a day (QID) | ORAL | Status: DC | PRN

## 2019-04-19 MED ORDER — POLYETHYLENE GLYCOL 3350 17 GM/SCOOP PO POWD: 1.0000 | Freq: Every day | ORAL | 0 refills | Status: DC

## 2019-04-19 MED ORDER — POLYETHYLENE GLYCOL 3350 17 G PO PACK
17.0000 g | PACK | Freq: Every day | ORAL | Status: DC
  Administered 2019-04-19: 17 g via ORAL
  Filled 2019-04-19: qty 1

## 2019-04-19 MED ORDER — ONDANSETRON HCL 4 MG/2ML IJ SOLN: 4.0000 mg | Freq: Four times a day (QID) | INTRAMUSCULAR | Status: DC | PRN

## 2019-04-19 MED ORDER — POTASSIUM CHLORIDE CRYS ER 20 MEQ PO TBCR
20.0000 meq | EXTENDED_RELEASE_TABLET | Freq: Once | ORAL | Status: AC
  Administered 2019-04-19: 20 meq via ORAL
  Filled 2019-04-19: qty 1

## 2019-04-19 MED ORDER — ATORVASTATIN CALCIUM 20 MG PO TABS: 80.0000 mg | ORAL_TABLET | Freq: Every day | ORAL | Status: DC

## 2019-04-19 MED ORDER — PANTOPRAZOLE SODIUM 40 MG PO TBEC
40.0000 mg | DELAYED_RELEASE_TABLET | Freq: Two times a day (BID) | ORAL | Status: DC
  Administered 2019-04-19: 40 mg via ORAL
  Filled 2019-04-19: qty 1

## 2019-04-19 MED ORDER — SODIUM CHLORIDE 0.9 % IV SOLN
INTRAVENOUS | Status: AC
  Administered 2019-04-19: 01:00:00 via INTRAVENOUS

## 2019-04-19 NOTE — Progress Notes (Signed)
Felt pressure and tried to have BM. No BM but small amount of blood noted per patient.

## 2019-04-19 NOTE — Discharge Summary (Addendum)
[place] [facility] [place] NAME: Stephen [name]    MR#:  [id number]  DATE OF BIRTH:  Jan 07, 1966  DATE OF ADMISSION:  04/18/2019 ADMITTING PHYSICIAN: Lance Coon, MD  DATE OF DISCHARGE: April 19, 2019 PRIMARY CARE PHYSICIAN: Valerie Roys, DO    ADMISSION DIAGNOSIS:  Lower GI bleed [K92.2]  DISCHARGE DIAGNOSIS:  Principal Problem: Rectal bleeding Active Problems:   Chronic systolic congestive heart failure (HCC)   COPD (chronic obstructive pulmonary disease) (HCC)   CKD (chronic kidney disease) stage 3, GFR 30-59 ml/min (HCC)   Hyperlipidemia   HTN (hypertension)   SECONDARY DIAGNOSIS:   Past Medical History:  Diagnosis Date  . AICD (automatic cardioverter/defibrillator) present   . AKI (acute kidney injury) ([place]) 12/24/2017  . Arrhythmia   . Arthritis    "lower back, knees" (06/10/2018)  . Cardiac arrest ([place]) 12/23/2017   Brief V-fib arrest  . Chest pain 09/08/2017  . CHF (congestive heart failure) (HCC)    nonischemic cardiomyopathy, EF 25%  . Gout    "on daily RX" (06/10/2018)  . Headache    "q couple months" (06/10/2018)  . High cholesterol   . History of kidney stones   . Hypertension   . Influenza A 12/24/2017  . NICM (nonischemic cardiomyopathy) ([place])   . Pneumonia 12/20/2017  . TIA (transient ischemic attack) 06/21/2016   "still affects my memory a little bit" (06/10/2018)    HOSPITAL COURSE:    53 year old male with a history of chronic kidney disease stage III, CAD and essential hypertension who presented to the emergency room due to concerns of rectal bleeding.  1.  Rectal bleeding: This is due to severe constipation and hard stool. Hemoglobin remained stable.  He has outpatient follow-up with Dr. Vira Agar on Tuesday.  2.  Nonischemic cardiomyopathy without evidence of heart failure during this admission: Patient will continue outpatient medications including Coreg, Aldactone and torsemide.  3.  CAD: Continue  aspirin, statin, Plavix.  4.  Essential hypertension: Continue Coreg  5.  Chronic kidney disease 3: Creatinine remains at baseline   DISCHARGE CONDITIONS AND DIET:   Stable for discharge heart healthy diet  CONSULTS OBTAINED:    DRUG ALLERGIES:   Allergies  Allergen Reactions  . Lisinopril Cough    DISCHARGE MEDICATIONS:   Allergies as of 04/19/2019      Reactions   Lisinopril Cough      Medication List    TAKE these medications   acetaminophen 325 MG tablet Commonly known as:  TYLENOL Take 2 tablets (650 mg total) by mouth every 6 (six) hours as needed. What changed:    how much to take  reasons to take this   albuterol 108 (90 Base) MCG/ACT inhaler Commonly known as:  VENTOLIN HFA Inhale 2 puffs into the lungs every 4 (four) hours as needed for wheezing or shortness of breath.   allopurinol 100 MG tablet Commonly known as:  ZYLOPRIM Take 2 tablets (200 mg total) by mouth daily.   amiodarone 200 MG tablet Commonly known as:  PACERONE Take 2 tablets (400 mg total) by mouth daily.   aspirin 81 MG EC tablet Take 1 tablet (81 mg total) by mouth daily.   atorvastatin 40 MG tablet Commonly known as:  LIPITOR Take 80 mg by mouth daily at 6 PM.   benzonatate 200 MG capsule Commonly known as:  TESSALON Take 1 capsule (200 mg total) by mouth 2 (two) times daily as needed for cough.  CAMPHOR-EUCALYPTUS-MENTHOL EX Apply 1 application topically as needed (congestion).   carvedilol 3.125 MG tablet Commonly known as:  COREG Take 1 tablet (3.125 mg total) by mouth daily.   clopidogrel 75 MG tablet Commonly known as:  PLAVIX TAKE ONE TABLET BY MOUTH EVERY DAY   colchicine 0.6 MG tablet Take 1 tablet (0.6 mg total) by mouth daily as needed (gout flare).   cyclobenzaprine 5 MG tablet Commonly known as:  FLEXERIL Take 1 tablet (5 mg total) by mouth 3 (three) times daily as needed (jaw pain).   diclofenac sodium 1 % Gel Commonly known as:  VOLTAREN Apply  2 g topically 4 (four) times daily as needed (pain).   famotidine 20 MG tablet Commonly known as:  PEPCID Take 20 mg by mouth daily as needed for heartburn or indigestion.   fluticasone 50 MCG/ACT nasal spray Commonly known as:  FLONASE Place 2 sprays into both nostrils daily. What changed:    when to take this  reasons to take this   fluticasone-salmeterol 115-21 MCG/ACT inhaler Commonly known as:  Advair HFA Inhale 2 puffs into the lungs 2 (two) times daily.   guaiFENesin 100 MG/5ML Soln Commonly known as:  ROBITUSSIN Take 5 mLs (100 mg total) by mouth every 4 (four) hours as needed for cough or to loosen phlegm.   ICY HOT EX Apply 1 application topically daily as needed (pain).   methimazole 10 MG tablet Commonly known as:  TAPAZOLE Take 4 tablets (40 mg total) by mouth daily.   metolazone 2.5 MG tablet Commonly known as:  ZAROXOLYN Take 2.5 mg by mouth as needed.   multivitamin with minerals Tabs tablet Take 1 tablet by mouth daily.   nitroGLYCERIN 0.4 MG SL tablet Commonly known as:  NITROSTAT Place 1 tablet (0.4 mg total) under the tongue every 5 (five) minutes as needed for chest pain.   ondansetron 4 MG disintegrating tablet Commonly known as:  Zofran ODT Take 1 tablet (4 mg total) by mouth every 8 (eight) hours as needed for nausea or vomiting.   pantoprazole 40 MG tablet Commonly known as:  PROTONIX Take 1 tablet (40 mg total) by mouth 2 (two) times daily.   polyethylene glycol 17 g packet Commonly known as:  MIRALAX / GLYCOLAX Take 17 g by mouth daily. Start taking on:  April 20, 2019   potassium chloride SA 20 MEQ tablet Commonly known as:  K-DUR Take 1 tablet (20 mEq total) by mouth 2 (two) times daily. What changed:  when to take this   predniSONE 20 MG tablet Commonly known as:  DELTASONE Take 1 tablet (20 mg total) by mouth daily with breakfast.   promethazine 12.5 MG tablet Commonly known as:  PHENERGAN Take 1 tablet (12.5 mg total) by  mouth every 6 (six) hours as needed for nausea or vomiting.   sennosides-docusate sodium 8.6-50 MG tablet Commonly known as:  SENOKOT-S Take 1 tablet by mouth daily.   sodium chloride 0.65 % Soln nasal spray Commonly known as:  OCEAN Place 1 spray into both nostrils as needed for congestion.   spironolactone 25 MG tablet Commonly known as:  ALDACTONE Take 0.5 tablets (12.5 mg total) by mouth daily.   Tiotropium Bromide Monohydrate 2.5 MCG/ACT Aers Commonly known as:  Spiriva Respimat Inhale 1 puff into the lungs daily.   torsemide 20 MG tablet Commonly known as:  DEMADEX Take 1 tablet (20 mg total) by mouth 2 (two) times daily.         Today  CHIEF COMPLAINT:  No evidence of rectal bleeding.  No abdominal pain, nausea or vomiting.  Tolerating diet.   VITAL SIGNS:  Blood pressure 106/86, pulse 66, temperature 97.7 F (36.5 C), temperature source Oral, resp. rate 16, height 6\' 1"  (1.854 m), weight 95.7 kg, SpO2 100 %.   REVIEW OF SYSTEMS:  Review of Systems  Constitutional: Negative.  Negative for chills, fever and malaise/fatigue.  HENT: Negative.  Negative for ear discharge, ear pain, hearing loss, nosebleeds and sore throat.   Eyes: Negative.  Negative for blurred vision and pain.  Respiratory: Negative.  Negative for cough, hemoptysis, shortness of breath and wheezing.   Cardiovascular: Negative.  Negative for chest pain, palpitations and leg swelling.  Gastrointestinal: Negative.  Negative for abdominal pain, blood in stool, diarrhea, nausea and vomiting.  Genitourinary: Negative.  Negative for dysuria.  Musculoskeletal: Negative.  Negative for back pain.  Skin: Negative.   Neurological: Negative for dizziness, tremors, speech change, focal weakness, seizures and headaches.  Endo/Heme/Allergies: Negative.  Does not bruise/bleed easily.  Psychiatric/Behavioral: Negative.  Negative for depression, hallucinations and suicidal ideas.     PHYSICAL EXAMINATION:   GENERAL:  53 y.o.-year-old patient lying in the bed with no acute distress.  NECK:  Supple, no jugular venous distention. No thyroid enlargement, no tenderness.  LUNGS: Normal breath sounds bilaterally, no wheezing, rales,rhonchi  No use of accessory muscles of respiration.  CARDIOVASCULAR: S1, S2 normal. 2/6  murmurs, NOrubs, or gallops.  ABDOMEN: Soft, non-tender, non-distended. Bowel sounds present. No organomegaly or mass.  EXTREMITIES: No pedal edema, cyanosis, or clubbing.  PSYCHIATRIC: The patient is alert and oriented x 3.  SKIN: No obvious rash, lesion, or ulcer.   DATA REVIEW:   CBC Recent Labs  Lab 04/19/19 0434  WBC 11.5*  HGB 14.1  HCT 42.8  PLT 255    Chemistries  Recent Labs  Lab 04/19/19 0434  NA 140  K 3.6  CL 104  CO2 26  GLUCOSE 95  BUN 50*  CREATININE 1.70*  CALCIUM 9.1    Cardiac Enzymes No results for input(s): TROPONINI in the last 168 hours.  Microbiology Results  @MICRORSLT48 @  RADIOLOGY:  No results found.    Allergies as of 04/19/2019      Reactions   Lisinopril Cough      Medication List    TAKE these medications   acetaminophen 325 MG tablet Commonly known as:  TYLENOL Take 2 tablets (650 mg total) by mouth every 6 (six) hours as needed. What changed:    how much to take  reasons to take this   albuterol 108 (90 Base) MCG/ACT inhaler Commonly known as:  VENTOLIN HFA Inhale 2 puffs into the lungs every 4 (four) hours as needed for wheezing or shortness of breath.   allopurinol 100 MG tablet Commonly known as:  ZYLOPRIM Take 2 tablets (200 mg total) by mouth daily.   amiodarone 200 MG tablet Commonly known as:  PACERONE Take 2 tablets (400 mg total) by mouth daily.   aspirin 81 MG EC tablet Take 1 tablet (81 mg total) by mouth daily.   atorvastatin 40 MG tablet Commonly known as:  LIPITOR Take 80 mg by mouth daily at 6 PM.   benzonatate 200 MG capsule Commonly known as:  TESSALON Take 1 capsule (200 mg  total) by mouth 2 (two) times daily as needed for cough.   CAMPHOR-EUCALYPTUS-MENTHOL EX Apply 1 application topically as needed (congestion).   carvedilol 3.125 MG tablet Commonly known as:  COREG Take 1 tablet (3.125 mg total) by mouth daily.   clopidogrel 75 MG tablet Commonly known as:  PLAVIX TAKE ONE TABLET BY MOUTH EVERY DAY   colchicine 0.6 MG tablet Take 1 tablet (0.6 mg total) by mouth daily as needed (gout flare).   cyclobenzaprine 5 MG tablet Commonly known as:  FLEXERIL Take 1 tablet (5 mg total) by mouth 3 (three) times daily as needed (jaw pain).   diclofenac sodium 1 % Gel Commonly known as:  VOLTAREN Apply 2 g topically 4 (four) times daily as needed (pain).   famotidine 20 MG tablet Commonly known as:  PEPCID Take 20 mg by mouth daily as needed for heartburn or indigestion.   fluticasone 50 MCG/ACT nasal spray Commonly known as:  FLONASE Place 2 sprays into both nostrils daily. What changed:    when to take this  reasons to take this   fluticasone-salmeterol 115-21 MCG/ACT inhaler Commonly known as:  Advair HFA Inhale 2 puffs into the lungs 2 (two) times daily.   guaiFENesin 100 MG/5ML Soln Commonly known as:  ROBITUSSIN Take 5 mLs (100 mg total) by mouth every 4 (four) hours as needed for cough or to loosen phlegm.   ICY HOT EX Apply 1 application topically daily as needed (pain).   methimazole 10 MG tablet Commonly known as:  TAPAZOLE Take 4 tablets (40 mg total) by mouth daily.   metolazone 2.5 MG tablet Commonly known as:  ZAROXOLYN Take 2.5 mg by mouth as needed.   multivitamin with minerals Tabs tablet Take 1 tablet by mouth daily.   nitroGLYCERIN 0.4 MG SL tablet Commonly known as:  NITROSTAT Place 1 tablet (0.4 mg total) under the tongue every 5 (five) minutes as needed for chest pain.   ondansetron 4 MG disintegrating tablet Commonly known as:  Zofran ODT Take 1 tablet (4 mg total) by mouth every 8 (eight) hours as needed for  nausea or vomiting.   pantoprazole 40 MG tablet Commonly known as:  PROTONIX Take 1 tablet (40 mg total) by mouth 2 (two) times daily.   polyethylene glycol 17 g packet Commonly known as:  MIRALAX / GLYCOLAX Take 17 g by mouth daily. Start taking on:  April 20, 2019   potassium chloride SA 20 MEQ tablet Commonly known as:  K-DUR Take 1 tablet (20 mEq total) by mouth 2 (two) times daily. What changed:  when to take this   predniSONE 20 MG tablet Commonly known as:  DELTASONE Take 1 tablet (20 mg total) by mouth daily with breakfast.   promethazine 12.5 MG tablet Commonly known as:  PHENERGAN Take 1 tablet (12.5 mg total) by mouth every 6 (six) hours as needed for nausea or vomiting.   sennosides-docusate sodium 8.6-50 MG tablet Commonly known as:  SENOKOT-S Take 1 tablet by mouth daily.   sodium chloride 0.65 % Soln nasal spray Commonly known as:  OCEAN Place 1 spray into both nostrils as needed for congestion.   spironolactone 25 MG tablet Commonly known as:  ALDACTONE Take 0.5 tablets (12.5 mg total) by mouth daily.   Tiotropium Bromide Monohydrate 2.5 MCG/ACT Aers Commonly known as:  Spiriva Respimat Inhale 1 puff into the lungs daily.   torsemide 20 MG tablet Commonly known as:  DEMADEX Take 1 tablet (20 mg total) by mouth 2 (two) times daily.          Management plans discussed with the patient and HE is in agreement. Stable for discharge   Patient should follow up  with PCP  CODE STATUS:     Code Status Orders  (From admission, onward)         Start     Ordered   04/19/19 0020  Full code  Continuous     04/19/19 0019        Code Status History    Date Active Date Inactive Code Status Order ID Comments User Context   04/07/2019 1426 04/15/2019 2146 Full Code [id number]  Hillary Bow, MD ED   02/18/2019 0021 03/02/2019 0025 Full Code [id number]  [name], MD ED   02/13/2019 1451 02/14/2019 2116 Full Code [id number]  Hillary Bow, MD ED    12/27/2018 0600 12/28/2018 1829 Partial Code [id number]  Harrie Foreman, MD Inpatient   12/04/2018 0437 12/10/2018 2037 Partial Code [id number]  Bradly Bienenstock, NP Inpatient   12/04/2018 0147 12/04/2018 0437 Full Code [id number]  Henreitta Leber, MD ED   08/19/2018 2248 08/20/2018 2258 Full Code [id number]  Amelia Jo, MD Inpatient   06/30/2018 1757 07/06/2018 1647 Full Code [id number]  [name], MD Inpatient   06/10/2018 1606 06/11/2018 1537 Full Code [id number]  Thompson Grayer, MD Inpatient   12/23/2017 1153 12/31/2017 1729 Full Code [id number]  Erlene Quan, PA-C Inpatient   12/20/2017 1851 12/23/2017 1110 Full Code [id number]  [name], MD Inpatient   09/08/2017 0644 09/10/2017 2127 Full Code [id number]  Harrie Foreman, MD Inpatient   07/29/2017 1044 07/31/2017 2152 Full Code [id number]  Henreitta Leber, MD Inpatient   06/19/2017 0027 06/20/2017 2114 Full Code [id number]  Lance Coon, MD ED   05/20/2017 1226 05/21/2017 1759 Full Code [id number]  [name], MD Inpatient   06/22/2016 0430 06/22/2016 2150 Full Code [id number]  Quintella Baton, MD Inpatient   06/04/2016 1843 06/06/2016 1815 Full Code [id number]  [name], MD Inpatient      TOTAL TIME TAKING CARE OF THIS PATIENT: 38 minutes.    Note: This dictation was prepared with Dragon dictation along with smaller phrase technology. Any transcriptional errors that result from this process are unintentional.  Bettey Costa M.D on 04/19/2019 at 9:30 AM  Between 7am to 6pm - Pager - [phone] After 6pm go to [email or web address] - password [email or web address]  Office  [phone]  CC: Primary care physician; Valerie Roys, DO

## 2019-04-20 ENCOUNTER — Telehealth: Payer: Self-pay

## 2019-04-20 ENCOUNTER — Telehealth (HOSPITAL_COMMUNITY): Payer: Self-pay

## 2019-04-20 ENCOUNTER — Other Ambulatory Visit (HOSPITAL_COMMUNITY): Payer: Self-pay

## 2019-04-20 NOTE — Telephone Encounter (Signed)
Opened in error

## 2019-04-20 NOTE — Telephone Encounter (Signed)
Received paperwork from [facility]. Faxed to [employer] for completion.

## 2019-04-20 NOTE — Telephone Encounter (Signed)
Telephoned Stephen [name] this morning to check on him since his visit at ER this past weekend for lower GI bleed.  He states no more bleeding and he is using the miralax.  He states he feels a lot better since his hospital admittance.  He states breathing is doing good, he is watching his diet and fluids.  Will visit with him at home this week.   [place] [phone]

## 2019-04-20 NOTE — Telephone Encounter (Addendum)
Virtual Visit Pre-Appointment Phone Call    Confirm consent - "In the setting of the current Covid19 crisis, you are scheduled for a (phone or video) visit with your provider on (date) at (time).  Just as we do with many in-office visits, in order for you to participate in this visit, we must obtain consent.  If you'd like, I can send this to your mychart (if signed up) or email for you to review.  Otherwise, I can obtain your verbal consent now.  All virtual visits are billed to your insurance company just like a normal visit would be.  By agreeing to a virtual visit, we'd like you to understand that the technology does not allow for your provider to perform an examination, and thus may limit your provider's ability to fully assess your condition. If your provider identifies any concerns that need to be evaluated in person, we will make arrangements to do so.  Finally, though the technology is pretty good, we cannot assure that it will always work on either your or our end, and in the setting of a video visit, we may have to convert it to a phone-only visit.  In either situation, we cannot ensure that we have a secure connection.  Are you willing to proceed? YES     TELEPHONE CALL NOTE  Stephen [name] has been deemed a candidate for a follow-up tele-health visit to limit community exposure during the Covid-19 pandemic. I spoke with the patient via phone to ensure availability of phone/video source, confirm preferred email & phone number, and discuss instructions and expectations.  I reminded [name] to be prepared with any vital sign and/or heart rhythm information that could potentially be obtained via home monitoring, at the time of his visit. I reminded [name] to expect a phone call prior to his visit.  [name], RMA 04/22/2019 11:20 AM       FULL LENGTH CONSENT FOR TELE-HEALTH VISIT   I hereby voluntarily request, consent and authorize [facility]  and its employed or contracted physicians, physician assistants, nurse practitioners or other licensed health care professionals (the Practitioner), to provide me with telemedicine health care services (the "Services") as deemed necessary by the treating Practitioner. I acknowledge and consent to receive the Services by the Practitioner via telemedicine. I understand that the telemedicine visit will involve communicating with the Practitioner through live audiovisual communication technology and the disclosure of certain medical information by electronic transmission. I acknowledge that I have been given the opportunity to request an in-person assessment or other available alternative prior to the telemedicine visit and am voluntarily participating in the telemedicine visit.  I understand that I have the right to withhold or withdraw my consent to the use of telemedicine in the course of my care at any time, without affecting my right to future care or treatment, and that the Practitioner or I may terminate the telemedicine visit at any time. I understand that I have the right to inspect all information obtained and/or recorded in the course of the telemedicine visit and may receive copies of available information for a reasonable fee.  I understand that some of the potential risks of receiving the Services via telemedicine include:  Marland Kitchen Delay or interruption in medical evaluation due to technological equipment failure or disruption; . Information transmitted may not be sufficient (e.g. poor resolution of images) to allow for appropriate medical decision making by the Practitioner; and/or  . In rare instances,  security protocols could fail, causing a breach of personal health information.  Furthermore, I acknowledge that it is my responsibility to provide information about my medical history, conditions and care that is complete and accurate to the best of my ability. I acknowledge that Practitioner's advice,  recommendations, and/or decision may be based on factors not within their control, such as incomplete or inaccurate data provided by me or distortions of diagnostic images or specimens that may result from electronic transmissions. I understand that the practice of medicine is not an exact science and that Practitioner makes no warranties or guarantees regarding treatment outcomes. I acknowledge that I will receive a copy of this consent concurrently upon execution via email to the email address I last provided but may also request a printed copy by calling the office of [facility].    I understand that my insurance will be billed for this visit.   I have read or had this consent read to me. . I understand the contents of this consent, which adequately explains the benefits and risks of the Services being provided via telemedicine.  . I have been provided ample opportunity to ask questions regarding this consent and the Services and have had my questions answered to my satisfaction. . I give my informed consent for the services to be provided through the use of telemedicine in my medical care  By participating in this telemedicine visit I agree to the above.

## 2019-04-21 ENCOUNTER — Encounter: Payer: Commercial Managed Care - PPO | Admitting: Internal Medicine

## 2019-04-21 ENCOUNTER — Telehealth: Payer: Self-pay

## 2019-04-21 NOTE — Telephone Encounter (Signed)
   TELEPHONE CALL NOTE  This patient has been deemed a candidate for follow-up tele-health visit to limit community exposure during the Covid-19 pandemic. I spoke with the patient via phone to discuss instructions. The patient was advised to review the section on consent for treatment as well. The patient will receive a phone call 2-3 days prior to their E-Visit at which time consent will be verbally confirmed. A Virtual Office Visit appointment type has been scheduled for 04/22/2019 with [name] FNP.  Tequan Redmon L, CMA 04/21/2019 10:06 AM

## 2019-04-21 NOTE — Telephone Encounter (Signed)
TELEPHONE CALL NOTE  Stephen [name] has been deemed a candidate for a follow-up tele-health visit to limit community exposure during the Covid-19 pandemic. I spoke with the patient via phone to ensure availability of phone/video source, confirm preferred email & phone number, discuss instructions and expectations, and review consent.   I reminded Stephen [name] to be prepared with any vital sign and/or heart rhythm information that could potentially be obtained via home monitoring, at the time of his visit.  Finally, I reminded Stephen [name] to expect an e-mail containing a link for their video-based visit approximately 15 minutes before his visit, or alternatively, a phone call at the time of his visit if his visit is planned to be a phone encounter.  Did the patient verbally consent to treatment as below? YES  Rilei Kravitz L, CMA 04/21/2019 10:06 AM  CONSENT FOR TELE-HEALTH VISIT - PLEASE REVIEW  I hereby voluntarily request, consent and authorize [facility] and its employed or contracted physicians, physician assistants, nurse practitioners or other licensed health care professionals (the Practitioner), to provide me with telemedicine health care services (the "Services") as deemed necessary by the treating Practitioner. I acknowledge and consent to receive the Services by the Practitioner via telemedicine. I understand that the telemedicine visit will involve communicating with the Practitioner through telephonic communication technology and the disclosure of certain medical information by electronic transmission. I acknowledge that I have been given the opportunity to request an in-person assessment or other available alternative prior to the telemedicine visit and am voluntarily participating in the telemedicine visit.  I understand that I have the right to withhold or withdraw my consent to the use of telemedicine in the course of my care at any time, without  affecting my right to future care or treatment, and that the Practitioner or I may terminate the telemedicine visit at any time. I understand that I have the right to inspect all information obtained and/or recorded in the course of the telemedicine visit and may receive copies of available information for a reasonable fee.  I understand that some of the potential risks of receiving the Services via telemedicine include:  Marland Kitchen Delay or interruption in medical evaluation due to technological equipment failure or disruption; . Information transmitted may not be sufficient (e.g. poor resolution of images) to allow for appropriate medical decision making by the Practitioner; and/or  . In rare instances, security protocols could fail, causing a breach of personal health information.  Furthermore, I acknowledge that it is my responsibility to provide information about my medical history, conditions and care that is complete and accurate to the best of my ability. I acknowledge that Practitioner's advice, recommendations, and/or decision may be based on factors not within their control, such as incomplete or inaccurate data provided by me or lack of visual representation. I understand that the practice of medicine is not an exact science and that Practitioner makes no warranties or guarantees regarding treatment outcomes. I acknowledge that I will receive a copy of this consent concurrently upon execution via email to the email address I last provided but may also request a printed copy by calling the office of [facility].    I understand that my insurance may be billed for this visit.   I have read or had this consent read to me. . I understand the contents of this consent, which adequately explains the benefits and risks of the Services being provided via telemedicine.  Marland Kitchen  I have been provided ample opportunity to ask questions regarding this consent and the Services and have had my questions  answered to my satisfaction. . I give my informed consent for the services to be provided through the use of telemedicine in my medical care  By participating in this telemedicine visit I agree to the above.

## 2019-04-22 ENCOUNTER — Encounter (HOSPITAL_COMMUNITY): Payer: Self-pay

## 2019-04-22 ENCOUNTER — Encounter: Payer: Self-pay | Admitting: Family

## 2019-04-22 ENCOUNTER — Other Ambulatory Visit (HOSPITAL_COMMUNITY): Payer: Self-pay

## 2019-04-22 ENCOUNTER — Ambulatory Visit: Payer: PRIVATE HEALTH INSURANCE | Attending: Family | Admitting: Family

## 2019-04-22 ENCOUNTER — Other Ambulatory Visit: Payer: Self-pay

## 2019-04-22 VITALS — BP 104/76 | HR 76 | Wt 223.0 lb

## 2019-04-22 DIAGNOSIS — I5022 Chronic systolic (congestive) heart failure: Secondary | ICD-10-CM

## 2019-04-22 DIAGNOSIS — I10 Essential (primary) hypertension: Secondary | ICD-10-CM

## 2019-04-22 MED ORDER — MAGNESIUM 400 MG PO TABS: 1.0000 | ORAL_TABLET | Freq: Two times a day (BID) | ORAL | 5 refills | Status: DC

## 2019-04-22 MED ORDER — ATORVASTATIN CALCIUM 40 MG PO TABS: 40.0000 mg | ORAL_TABLET | Freq: Every day | ORAL | 5 refills | Status: DC

## 2019-04-22 MED ORDER — NITROGLYCERIN 0.4 MG SL SUBL: 0.4000 mg | SUBLINGUAL_TABLET | SUBLINGUAL | 3 refills | Status: DC | PRN

## 2019-04-22 NOTE — Progress Notes (Signed)
Today was at a home visit with Stephen [name] while he had a telemedicine with HF clinic with [facility].  He states feeling much better, gets tired easily.  He does some walking on pretty days, short distances and flat ground.  Denies chest pain, headaches, shortness of breath at rest or dizziness.  Been trying to watch his sodium, will put a referral in for [employer], I think would benefit him.  Discussed fluids and to watch how much he drinks.  He weighs daily.  He is aware of what medications to take, when and why.  He is scheduling an appt with HF clinic at [facility] for heart transplant.  He has no edema in lower extremities, lungs are clear.  He states does not feel like he has fluid built up today.  His bleeding has stopped and he had appt with GI doctor and states everything looks ok, she is wanting further testing done.  Will continue to visit at home for heart failure, medication compliance and diet.   [place] [phone]

## 2019-04-22 NOTE — Patient Instructions (Signed)
Resume weighing daily and call for an overnight weight gain of > 2 pounds or a weekly weight gain of >5 pounds. 

## 2019-04-22 NOTE — Progress Notes (Signed)
Virtual Visit via Telephone Note    Evaluation Performed:  Follow-up visit  This visit type was conducted due to national recommendations for restrictions regarding the COVID-19 Pandemic (e.g. social distancing).  This format is felt to be most appropriate for this patient at this time.  All issues noted in this document were discussed and addressed.  No physical exam was performed (except for noted visual exam findings with Video Visits).  Please refer to the patient's chart (MyChart message for video visits and phone note for telephone visits) for the patient's consent to telehealth for [facility]  Date:  04/22/2019   ID:  Stephen [name], DOB 1966/12/22, MRN [id number]  Patient Location:  [place] [address]   Provider location:   [facility] [place] [address], [place] [address]  PCP:  Valerie Roys, DO  Cardiologist:  Lujean Amel, MD Electrophysiologist:  None   Chief Complaint:  Chronic fatigue  History of Present Illness:    Stephen [name] is a 53 y.o. male who presents via audio/video conferencing for a telehealth visit today.  Patient verified DOB and address.  The patient does not have symptoms concerning for COVID-19 infection (fever, chills, cough, or new SHORTNESS OF BREATH).   Patient describes moderate fatigue upon minimal exertion. He describes this as chronic in nature having been present for several years. He has associated rhinorrhea, dry cough and shortness of breath along with this. He denies dizziness, edema in legs/ abdomen, palpitations, chest pain, weight gain or change in sleeping pattern. Says that he hasn't been weighing himself daily but is monitoring sodium intake carefully. Is waiting on an appointment for [facility] to begin the process of getting worked up for possible transplant.   Prior CV studies:   The following studies were reviewed today:  Echo report from 02/22/2019  reviewed and showed an EF of 10% along with moderate/ severe MR and trivial AR.   Past Medical History:  Diagnosis Date  . AICD (automatic cardioverter/defibrillator) present   . AKI (acute kidney injury) ([place]) 12/24/2017  . Arrhythmia   . Arthritis    "lower back, knees" (06/10/2018)  . Cardiac arrest ([place]) 12/23/2017   Brief V-fib arrest  . Chest pain 09/08/2017  . CHF (congestive heart failure) ([facility])    nonischemic cardiomyopathy, EF 25%  . Gout    "on daily RX" (06/10/2018)  . Headache    "q couple months" (06/10/2018)  . High cholesterol   . History of kidney stones   . Hypertension   . Influenza A 12/24/2017  . NICM (nonischemic cardiomyopathy) ([place])   . Pneumonia 12/20/2017  . TIA (transient ischemic attack) 06/21/2016   "still affects my memory a little bit" (06/10/2018)   Past Surgical History:  Procedure Laterality Date  . EXTERNAL FIXATION LEG Right ~ 2000   "was going bowlegged; had to brake my leg to fix it"  . HERNIA REPAIR    . ICD IMPLANT N/A 12/30/2017   Procedure: ICD IMPLANT;  Surgeon: Deboraha Sprang, MD;  Location: [facility];  Service: Cardiovascular;  Laterality: N/A;  . LEFT HEART CATH AND CORONARY ANGIOGRAPHY N/A 09/09/2017   Procedure: LEFT HEART CATH AND CORONARY ANGIOGRAPHY;  Surgeon: Teodoro Spray, MD;  Location: [facility];  Service: Cardiovascular;  Laterality: N/A;  . UMBILICAL HERNIA REPAIR  1990s  . V TACH ABLATION  06/10/2018  . V TACH ABLATION N/A 06/10/2018  Procedure: V TACH ABLATION;  Surgeon: Thompson Grayer, MD;  Location: [facility];  Service: Cardiovascular;  Laterality: N/A;  . VASECTOMY       Current Meds  Medication Sig  . acetaminophen (TYLENOL) 325 MG tablet Take 2 tablets (650 mg total) by mouth every 6 (six) hours as needed. (Patient taking differently: Take 325-650 mg by mouth every 6 (six) hours as needed for moderate pain. )  . albuterol (PROVENTIL HFA;VENTOLIN HFA) 108 (90 Base) MCG/ACT inhaler  Inhale 2 puffs into the lungs every 4 (four) hours as needed for wheezing or shortness of breath.  . allopurinol (ZYLOPRIM) 100 MG tablet Take 2 tablets (200 mg total) by mouth daily. (Patient taking differently: Take 100 mg by mouth daily. )  . amiodarone (PACERONE) 200 MG tablet Take 200 mg by mouth 2 (two) times daily.  Marland Kitchen aspirin EC 81 MG EC tablet Take 1 tablet (81 mg total) by mouth daily.  Marland Kitchen atorvastatin (LIPITOR) 40 MG tablet Take 40 mg by mouth daily at 6 PM.   . carvedilol (COREG) 3.125 MG tablet Take 3.125 mg by mouth 2 (two) times daily with a meal.  . clopidogrel (PLAVIX) 75 MG tablet TAKE ONE TABLET BY MOUTH EVERY DAY (Patient taking differently: Take 75 mg by mouth daily. )  . colchicine 0.6 MG tablet Take 1 tablet (0.6 mg total) by mouth daily as needed (gout flare).  . cyclobenzaprine (FLEXERIL) 5 MG tablet Take 1 tablet (5 mg total) by mouth 3 (three) times daily as needed (jaw pain).  Marland Kitchen diclofenac sodium (VOLTAREN) 1 % GEL Apply 2 g topically 4 (four) times daily as needed (pain).   Marland Kitchen doxycycline (ADOXA) 100 MG tablet Take 100 mg by mouth 2 (two) times daily.  . fluticasone (FLONASE) 50 MCG/ACT nasal spray Place 2 sprays into both nostrils daily. (Patient taking differently: Place 2 sprays into both nostrils daily as needed for allergies. )  . fluticasone-salmeterol (ADVAIR HFA) 115-21 MCG/ACT inhaler Inhale 2 puffs into the lungs 2 (two) times daily.  Marland Kitchen guaiFENesin (ROBITUSSIN) 100 MG/5ML SOLN Take 5 mLs (100 mg total) by mouth every 4 (four) hours as needed for cough or to loosen phlegm.  . Magnesium 400 MG TABS Take 1 tablet by mouth 2 (two) times daily.  . methimazole (TAPAZOLE) 10 MG tablet Take 4 tablets (40 mg total) by mouth daily.  . metolazone (ZAROXOLYN) 2.5 MG tablet Take 2.5 mg by mouth as needed.   . Multiple Vitamin (MULTIVITAMIN WITH MINERALS) TABS tablet Take 1 tablet by mouth daily.  . nitroGLYCERIN (NITROSTAT) 0.4 MG SL tablet Place 1 tablet (0.4 mg total) under  the tongue every 5 (five) minutes as needed for chest pain.  . pantoprazole (PROTONIX) 40 MG tablet Take 1 tablet (40 mg total) by mouth 2 (two) times daily.  . polyethylene glycol (MIRALAX / GLYCOLAX) 17 g packet Take 17 g by mouth daily.  . potassium chloride SA (K-DUR,KLOR-CON) 20 MEQ tablet Take 1 tablet (20 mEq total) by mouth 2 (two) times daily. (Patient taking differently: Take 20 mEq by mouth 4 (four) times daily. )  . predniSONE (DELTASONE) 20 MG tablet Take 1 tablet (20 mg total) by mouth daily with breakfast.  . promethazine (PHENERGAN) 12.5 MG tablet Take 1 tablet (12.5 mg total) by mouth every 6 (six) hours as needed for nausea or vomiting.  . sennosides-docusate sodium (SENOKOT-S) 8.6-50 MG tablet Take 1 tablet by mouth daily.  . sodium chloride (OCEAN) 0.65 % SOLN nasal spray  Place 1 spray into both nostrils as needed for congestion.  Marland Kitchen spironolactone (ALDACTONE) 25 MG tablet Take 0.5 tablets (12.5 mg total) by mouth daily.  . Tiotropium Bromide Monohydrate (SPIRIVA RESPIMAT) 2.5 MCG/ACT AERS Inhale 1 puff into the lungs daily.  Marland Kitchen torsemide (DEMADEX) 20 MG tablet Take 1 tablet (20 mg total) by mouth 2 (two) times daily.  . [DISCONTINUED] carvedilol (COREG) 3.125 MG tablet Take 1 tablet (3.125 mg total) by mouth daily. (Patient taking differently: Take 3.125 mg by mouth 2 (two) times daily with a meal. )  . [DISCONTINUED] Magnesium 400 MG TABS Take 1 tablet by mouth 2 (two) times daily.  . [DISCONTINUED] nitroGLYCERIN (NITROSTAT) 0.4 MG SL tablet Place 1 tablet (0.4 mg total) under the tongue every 5 (five) minutes as needed for chest pain.     Allergies:   Lisinopril   Social History   Tobacco Use  . Smoking status: Former Smoker    Packs/day: 0.33    Years: 33.00    Pack years: 10.89    Types: Cigarettes    Last attempt to quit: 02/20/2016    Years since quitting: 3.1  . Smokeless tobacco: Never Used  Substance Use Topics  . Alcohol use: Yes    Alcohol/week: 0.0  standard drinks    Comment: 06/10/2018 "beer once/month; if that"  . Drug use: Never     Family Hx: The patient's family history includes CAD in his father; Heart attack in his father; Heart failure in his mother; Hypertension in his father and mother.  ROS:   Please see the history of present illness.     All other systems reviewed and are negative.   Labs/Other Tests and Data Reviewed:    Recent Labs: 02/18/2019: TSH <0.010 03/20/2019: Magnesium 2.1 04/07/2019: ALT 15; B Natriuretic Peptide >4,500.0 04/19/2019: BUN 50; Creatinine, Ser 1.70; Hemoglobin 14.1; Platelets 255; Potassium 3.6; Sodium 140   Recent Lipid Panel Lab Results  Component Value Date/Time   CHOL 217 (H) 12/03/2018 06:57 PM   CHOL 184 09/19/2018 03:30 PM   TRIG 179 (H) 12/03/2018 06:57 PM   HDL 47 12/03/2018 06:57 PM   HDL 42 09/19/2018 03:30 PM   CHOLHDL 4.6 12/03/2018 06:57 PM   LDLCALC 134 (H) 12/03/2018 06:57 PM   LDLCALC 123 (H) 09/19/2018 03:30 PM    Wt Readings from Last 3 Encounters:  04/22/19 223 lb (101.2 kg)  04/19/19 210 lb 15.7 oz (95.7 kg)  04/15/19 223 lb 3.2 oz (101.2 kg)     Exam:    Vital Signs:  BP 104/76 Comment: self-reported  Pulse 76 Comment: self-reported  Wt 223 lb (101.2 kg) Comment: self-reported  SpO2 97% Comment: self-reported  BMI 29.42 kg/m    Well nourished, well developed male in no  acute distress.   ASSESSMENT & PLAN:    1. Chronic heart failure with reduced ejection fraction- - NYHA class III - euvolemic today based on patient's description of symptoms - hasn't been weighing daily and he was encouraged to resume so that he can call us for an overnight weight gain of >2 pounds or a weekly weight gain of > 5 pounds - not adding salt and is trying to follow a low sodium diet carefully - saw cardiology [facility]) yesterday and is waiting on an appointment at [facility] to be worked up for possible transplant; patient did not want to return to [facility] -  participating in paramedicine program   - BNP 04/07/2019 was >4500.0  2: HTN- - follows with [place] - BP has been low in the past but looks pretty good today - BMP from 04/19/2019 reviewed and showed sodium 140, potassium 3.6, creatinine 1.7 and GFR 52  COVID-19 Education: The signs and symptoms of COVID-19 were discussed with the patient and how to seek care for testing (follow up with PCP or arrange E-visit).  The importance of social distancing was discussed today.  Patient Risk:   After full review of this patients clinical status, I feel that they are at least moderate risk at this time.  Time:   Today, I have spent 18 minutes with the patient with telehealth technology discussing medications, daily weight and symptoms to call about.   Medication Adjustments/Labs and Tests Ordered: Current medicines are reviewed at length with the patient today.  Concerns regarding medicines are outlined above.   Tests Ordered: No orders of the defined types were placed in this encounter.  Medication Changes: Meds ordered this encounter  Medications  . Magnesium 400 MG TABS    Sig: Take 1 tablet by mouth 2 (two) times daily.    Dispense:  60 tablet    Refill:  5  . nitroGLYCERIN (NITROSTAT) 0.4 MG SL tablet    Sig: Place 1 tablet (0.4 mg total) under the tongue every 5 (five) minutes as needed for chest pain.    Dispense:  25 tablet    Refill:  3    Disposition: Follow-up in 6 weeks or sooner for any questions/problems before then.   Signed, [name], FNP  04/22/2019 10:35 AM    [facility]

## 2019-04-23 ENCOUNTER — Telehealth (HOSPITAL_COMMUNITY): Payer: Self-pay | Admitting: Licensed Clinical Social Worker

## 2019-04-23 NOTE — Telephone Encounter (Signed)
Patient identified as a candidate to receive 14 heart healthy meals per week for 3 months through [facility] partnership with [employer] program.   Completed referral sent in for review.  Anticipate patient will receive first shipment of food in 1-3 business days.  Stephen Heitzenrater H. Mailen Newborn, LCSW Clinical Social Worker [facility] Desk#: [phone] Cell#: [phone]   

## 2019-04-29 ENCOUNTER — Telehealth (HOSPITAL_COMMUNITY): Payer: Self-pay | Admitting: Licensed Clinical Social Worker

## 2019-04-29 ENCOUNTER — Telehealth (HOSPITAL_COMMUNITY): Payer: Self-pay

## 2019-04-29 NOTE — Telephone Encounter (Signed)
Today was a telephone appt with Stephen [name]. 222 lbs  Bp: 110/76 pulse: 76 resp: 16 , no chest pain.  Walking for exercise.  States 1 episode of shortness of breath, he was mopping and vacuuming, he sat and rested, did not continue.  Still waiting for appt at [facility] to see about heart transplant.  He is watching his fluids, about 64 ounces a day, mostly water. Legs has no edema.  He deneis that he feel slike he has fluid built up.  He is suppose to get [employer] delivered today, he has been trying to watch high sodium foods.  He states been feeling pretty good, dont try to push strenuous activities.  He states has all his medications. Advised him to call me if any problems, will visit at home and telephone appts at this time during coronavirus pandemic to reduce face to face with him.   [place] [phone]

## 2019-04-29 NOTE — Telephone Encounter (Signed)
CSW reached out to patient to inquire how first [place] delivery was going (per [employer] patient was supposed to receive first delivery on 04/25).  Pt reports he has not gotten delivery yet but that he was contacted by the organization and told that he should anticipate first delivery today.  CSW will continue to follow and assist as needed- encouraged pt to reach out if he did not receive delivery today so we could troubleshoot the issue  Jorge Ny, [facility] Desk#: [phone] Cell#: [phone]

## 2019-04-30 ENCOUNTER — Telehealth (INDEPENDENT_AMBULATORY_CARE_PROVIDER_SITE_OTHER): Payer: PRIVATE HEALTH INSURANCE | Admitting: Internal Medicine

## 2019-04-30 ENCOUNTER — Other Ambulatory Visit: Payer: Self-pay

## 2019-04-30 ENCOUNTER — Telehealth (HOSPITAL_COMMUNITY): Payer: Self-pay

## 2019-04-30 VITALS — BP 110/78 | HR 77 | Ht 73.5 in | Wt 224.0 lb

## 2019-04-30 DIAGNOSIS — Z79899 Other long term (current) drug therapy: Secondary | ICD-10-CM

## 2019-04-30 DIAGNOSIS — I493 Ventricular premature depolarization: Secondary | ICD-10-CM

## 2019-04-30 NOTE — Progress Notes (Signed)
Electrophysiology TeleHealth Note   Due to national recommendations of social distancing due to COVID 19, an audio/video telehealth visit is felt to be most appropriate for this patient at this time.  See MyChart message from today for the patient's consent to telehealth for [facility].   Date:  04/30/2019   ID:  Stephen [name], Stephen [name] [birth year], MRN [id number]  Location: patient's home  Provider location: [address], [place]  Evaluation Performed: Follow-up visit  PCP:  Valerie Roys, DO  Cardiologist:   DC Electrophysiologist:  SK   Chief Complaint:  VT  History of Present Illness:    Stephen [name] is a 53 y.o. male who presents via audio/video conferencing for a telehealth visit today.  Since last being seen in our clinic for *VT  the patient reports doing quite well with interval hospitalization for GI bleeding*  Has modest chronic shortness of breath.  No peripheral edema.  No orthopnea.  Denies chest pain.  Last VT was "3 months ago ".  Device currently followed by [facility]   Apparently there is an interval recommendation for him to go back to [facility] for consideration of transplantation  DATE TEST EF   10/18 Echo   25 % Mod LAE MR  12/18  cMRI  16 % Hypokinetic RV midwall gadolinium Without LVH noted   7/19 Echo 10-15 %   2/20 Echo 10% LVH severe   Date Cr K TSH LFTs  4/19 1.38 3.9 1.73 (2/19) 13(2/19)   10/19 2.05 3.9 0.134>> 0.070  24  2/20   T4 2.36    4/20  2.42>>1.7 3.9     CT imaging 2/20 of abdominal/pelvis demonstrated no adenopathy  The patient denies symptoms of fevers, chills, cough, or new SOB worrisome for COVID 19.    Past Medical History:  Diagnosis Date  . AICD (automatic cardioverter/defibrillator) present   . AKI (acute kidney injury) ([place]) 12/24/2017  . Arrhythmia   . Arthritis    "lower back, knees" (06/10/2018)  . Cardiac arrest ([place]) 12/23/2017   Brief V-fib arrest  . Chest pain 09/08/2017  . CHF  (congestive heart failure) (HCC)    nonischemic cardiomyopathy, EF 25%  . Gout    "on daily RX" (06/10/2018)  . Headache    "q couple months" (06/10/2018)  . High cholesterol   . History of kidney stones   . Hypertension   . Influenza A 12/24/2017  . NICM (nonischemic cardiomyopathy) ([place])   . Pneumonia 12/20/2017  . TIA (transient ischemic attack) 06/21/2016   "still affects my memory a little bit" (06/10/2018)    Past Surgical History:  Procedure Laterality Date  . EXTERNAL FIXATION LEG Right ~ 2000   "was going bowlegged; had to brake my leg to fix it"  . HERNIA REPAIR    . ICD IMPLANT N/A 12/30/2017   Procedure: ICD IMPLANT;  Surgeon: Deboraha Sprang, MD;  Location: [place] [facility];  Service: Cardiovascular;  Laterality: N/A;  . LEFT HEART CATH AND CORONARY ANGIOGRAPHY N/A 09/09/2017   Procedure: LEFT HEART CATH AND CORONARY ANGIOGRAPHY;  Surgeon: Teodoro Spray, MD;  Location: [place] [facility];  Service: Cardiovascular;  Laterality: N/A;  . UMBILICAL HERNIA REPAIR  1990s  . V TACH ABLATION  06/10/2018  . V TACH ABLATION N/A 06/10/2018   Procedure: V TACH ABLATION;  Surgeon: Thompson Grayer, MD;  Location: [place] [facility];  Service: Cardiovascular;  Laterality: N/A;  . VASECTOMY  Current Outpatient Medications  Medication Sig Dispense Refill  . acetaminophen (TYLENOL) 325 MG tablet Take 2 tablets (650 mg total) by mouth every 6 (six) hours as needed. (Patient taking differently: Take 325-650 mg by mouth every 6 (six) hours as needed for moderate pain. ) 60 tablet 0  . albuterol (PROVENTIL HFA;VENTOLIN HFA) 108 (90 Base) MCG/ACT inhaler Inhale 2 puffs into the lungs every 4 (four) hours as needed for wheezing or shortness of breath. 1 Inhaler 0  . allopurinol (ZYLOPRIM) 100 MG tablet Take 2 tablets (200 mg total) by mouth daily. (Patient taking differently: Take 100 mg by mouth daily. ) 60 tablet 6  . amiodarone (PACERONE) 200 MG tablet Take 200 mg by mouth 2 (two)  times daily.    Marland Kitchen aspirin EC 81 MG EC tablet Take 1 tablet (81 mg total) by mouth daily. 90 tablet 3  . atorvastatin (LIPITOR) 40 MG tablet Take 1 tablet (40 mg total) by mouth daily at 6 PM. 30 tablet 5  . carvedilol (COREG) 3.125 MG tablet Take 3.125 mg by mouth 2 (two) times daily with a meal.    . clopidogrel (PLAVIX) 75 MG tablet TAKE ONE TABLET BY MOUTH EVERY DAY (Patient taking differently: Take 75 mg by mouth daily. ) 30 tablet 5  . colchicine 0.6 MG tablet Take 1 tablet (0.6 mg total) by mouth daily as needed (gout flare). 6 tablet 1  . cyclobenzaprine (FLEXERIL) 5 MG tablet Take 1 tablet (5 mg total) by mouth 3 (three) times daily as needed (jaw pain). 20 tablet 0  . diclofenac sodium (VOLTAREN) 1 % GEL Apply 2 g topically 4 (four) times daily as needed (pain).     Marland Kitchen doxycycline (ADOXA) 100 MG tablet Take 100 mg by mouth 2 (two) times daily.    . fluticasone (FLONASE) 50 MCG/ACT nasal spray Place 2 sprays into both nostrils daily. (Patient taking differently: Place 2 sprays into both nostrils daily as needed for allergies. ) 16 g 2  . fluticasone-salmeterol (ADVAIR HFA) 115-21 MCG/ACT inhaler Inhale 2 puffs into the lungs 2 (two) times daily. 1 Inhaler 12  . guaiFENesin (ROBITUSSIN) 100 MG/5ML SOLN Take 5 mLs (100 mg total) by mouth every 4 (four) hours as needed for cough or to loosen phlegm. 236 mL 0  . Magnesium 400 MG TABS Take 1 tablet by mouth 2 (two) times daily. 60 tablet 5  . methimazole (TAPAZOLE) 10 MG tablet Take 4 tablets (40 mg total) by mouth daily. 120 tablet 0  . metolazone (ZAROXOLYN) 2.5 MG tablet Take 2.5 mg by mouth as needed.   11  . Multiple Vitamin (MULTIVITAMIN WITH MINERALS) TABS tablet Take 1 tablet by mouth daily.    . nitroGLYCERIN (NITROSTAT) 0.4 MG SL tablet Place 1 tablet (0.4 mg total) under the tongue every 5 (five) minutes as needed for chest pain. 25 tablet 3  . pantoprazole (PROTONIX) 40 MG tablet Take 1 tablet (40 mg total) by mouth 2 (two) times  daily. 60 tablet 2  . polyethylene glycol (MIRALAX / GLYCOLAX) 17 g packet Take 17 g by mouth daily. 14 each 0  . potassium chloride SA (K-DUR,KLOR-CON) 20 MEQ tablet Take 1 tablet (20 mEq total) by mouth 2 (two) times daily. (Patient taking differently: Take 20 mEq by mouth 4 (four) times daily. ) 30 tablet 1  . promethazine (PHENERGAN) 12.5 MG tablet Take 1 tablet (12.5 mg total) by mouth every 6 (six) hours as needed for nausea or vomiting. 20 tablet 0  .  sennosides-docusate sodium (SENOKOT-S) 8.6-50 MG tablet Take 1 tablet by mouth daily.    . sodium chloride (OCEAN) 0.65 % SOLN nasal spray Place 1 spray into both nostrils as needed for congestion.    Marland Kitchen spironolactone (ALDACTONE) 25 MG tablet Take 0.5 tablets (12.5 mg total) by mouth daily. 30 tablet 0  . Tiotropium Bromide Monohydrate (SPIRIVA RESPIMAT) 2.5 MCG/ACT AERS Inhale 1 puff into the lungs daily. 1 Inhaler 12  . torsemide (DEMADEX) 20 MG tablet Take 1 tablet (20 mg total) by mouth 2 (two) times daily. 60 tablet 0   No current facility-administered medications for this visit.     Allergies:   Lisinopril   Social History:  The patient  reports that he quit smoking about 3 years ago. His smoking use included cigarettes. He has a 10.89 pack-year smoking history. He has never used smokeless tobacco. He reports current alcohol use. He reports that he does not use drugs.   Family History:  The patient's   family history includes CAD in his father; Heart attack in his father; Heart failure in his mother; Hypertension in his father and mother.   ROS:  Please see the history of present illness.   All other systems are personally reviewed and negative.    Exam:    Vital Signs:  BP 110/78 (BP Location: Left Arm, Patient Position: Sitting, Cuff Size: Normal)   Pulse 77   Ht 6' 1.5" (1.867 m)   Wt 224 lb (101.6 kg)   BMI 29.15 kg/m     Well appearing, alert and conversant, regular work of breathing,  good skin color Eyes- anicteric,  neuro- grossly intact, skin- no apparent rash or lesions or cyanosis, mouth- oral mucosa is pink   Labs/Other Tests and Data Reviewed:    Recent Labs: 02/18/2019: TSH <0.010 03/20/2019: Magnesium 2.1 04/07/2019: ALT 15; B Natriuretic Peptide >4,500.0 04/19/2019: BUN 50; Creatinine, Ser 1.70; Hemoglobin 14.1; Platelets 255; Potassium 3.6; Sodium 140   Wt Readings from Last 3 Encounters:  04/30/19 224 lb (101.6 kg)  04/22/19 223 lb (101.2 kg)  04/22/19 223 lb (101.2 kg)     Other studies personally reviewed: Additional studies/ records that were reviewed today include:As above  Review of the above records today demonstrates: As above  Prior radiographs:    Last device report 12/19 with treated VT  ASSESSMENT & PLAN:    Cardiomyopathy-nonischemic  Congestive heart failure-class IIb-IIIa  Sleep disordered breathing and daytime somnolence question sleep apnea  LBBB- iatrogenic  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  Unfortunately, we do not have access to his remote is they are followed by [facility].  I have asked the patient to discuss with [name] how it is that we might be of assistance in managing his ventricular tachycardia.  To do so we would have to have access to his remotes.  He is hyperthyroid on methimazole.  Last thyroid measurements were 2/20.  We will check free T4 and TSH.  Seems like he is euvolemic.  Renal function has been quite labile; Delene Loll has been discontinued.  If blood pressure can tolerate it, he might benefit from hydralazine/nitrates.  As his GFR worsens the spironolactone we also have to be discontinued.  Renal issues may inform advanced therapies.  Have encouraged him however to pursue consultation  at [facility].  I had mentioned to  him that we are glad to help  In whatever way suits him and Dr DC  e    COVID 19 screen The patient denies symptoms of COVID 19 at this time.  The importance of social distancing was discussed today.  Follow-up: prn     Current medicines are reviewed at length with the patient today.   The patient does not have concerns regarding his medicines.  The following changes were made today:  none  Labs/ tests ordered today include: TSH T4 No orders of the defined types were placed in this encounter.   Future tests ( post COVID )     Patient Risk:  after full review of this patients clinical status, I feel that they are at moderate  risk at this time.  Today, I have spent 16 minutes with the patient with telehealth technology discussing the above.  Signed, Virl Axe, MD  04/30/2019 9:23 AM     [place]  [place] [address] [phone] (office) [phone] (fax)

## 2019-04-30 NOTE — Telephone Encounter (Signed)
Contacted Stephen [name] and he has received his Moms meals and enjoying them.   [place] [phone]

## 2019-04-30 NOTE — Patient Instructions (Addendum)
Medication Instructions:  - Your physician recommends that you continue on your current medications as directed. Please refer to the Current Medication list given to you today.  If you need a refill on your cardiac medications before your next appointment, please call your pharmacy.   Lab work: -  Your physician recommends that you have lab work: TSH/ Free T4- please go to [employer] at [facility] at your convenience to have this done- 1st desk on the right to check in.   If you have labs (blood work) drawn today and your tests are completely normal, you will receive your results only by: Marland Kitchen MyChart Message (if you have MyChart) OR . A paper copy in the mail If you have any lab test that is abnormal or we need to change your treatment, we will call you to review the results.  Testing/Procedures: - none ordered  Follow-Up: At [facility], you and your health needs are our priority.  As part of our continuing mission to provide you with exceptional heart care, we have created designated Provider Care Teams.  These Care Teams include your primary Cardiologist (physician) and Advanced Practice Providers (APPs -  Physician Assistants and Nurse Practitioners) who all work together to provide you with the care you need, when you need it. Marland Kitchen as needed with [name]  Any Other Special Instructions Will Be Listed Below (If Applicable). - N/A

## 2019-05-02 ENCOUNTER — Encounter: Payer: Self-pay | Admitting: Emergency Medicine

## 2019-05-02 ENCOUNTER — Inpatient Hospital Stay
Admission: EM | Admit: 2019-05-02 | Discharge: 2019-05-04 | DRG: 291 | Disposition: A | Discharge status: Home / Self Care | Payer: PRIVATE HEALTH INSURANCE | Attending: Specialist | Admitting: Specialist

## 2019-05-02 ENCOUNTER — Emergency Department: Payer: PRIVATE HEALTH INSURANCE

## 2019-05-02 ENCOUNTER — Other Ambulatory Visit: Payer: Self-pay

## 2019-05-02 DIAGNOSIS — Z95810 Presence of automatic (implantable) cardiac defibrillator: Secondary | ICD-10-CM

## 2019-05-02 DIAGNOSIS — N179 Acute kidney failure, unspecified: Secondary | ICD-10-CM | POA: Diagnosis present

## 2019-05-02 DIAGNOSIS — K219 Gastro-esophageal reflux disease without esophagitis: Secondary | ICD-10-CM | POA: Diagnosis present

## 2019-05-02 DIAGNOSIS — J449 Chronic obstructive pulmonary disease, unspecified: Secondary | ICD-10-CM | POA: Diagnosis present

## 2019-05-02 DIAGNOSIS — N183 Chronic kidney disease, stage 3 (moderate): Secondary | ICD-10-CM | POA: Diagnosis present

## 2019-05-02 DIAGNOSIS — Z7902 Long term (current) use of antithrombotics/antiplatelets: Secondary | ICD-10-CM | POA: Diagnosis not present

## 2019-05-02 DIAGNOSIS — Z79899 Other long term (current) drug therapy: Secondary | ICD-10-CM

## 2019-05-02 DIAGNOSIS — E0590 Thyrotoxicosis, unspecified without thyrotoxic crisis or storm: Secondary | ICD-10-CM | POA: Diagnosis present

## 2019-05-02 DIAGNOSIS — I509 Heart failure, unspecified: Secondary | ICD-10-CM

## 2019-05-02 DIAGNOSIS — M109 Gout, unspecified: Secondary | ICD-10-CM | POA: Diagnosis present

## 2019-05-02 DIAGNOSIS — R059 Cough, unspecified: Secondary | ICD-10-CM

## 2019-05-02 DIAGNOSIS — Z888 Allergy status to other drugs, medicaments and biological substances status: Secondary | ICD-10-CM

## 2019-05-02 DIAGNOSIS — I130 Hypertensive heart and chronic kidney disease with heart failure and stage 1 through stage 4 chronic kidney disease, or unspecified chronic kidney disease: Principal | ICD-10-CM | POA: Diagnosis present

## 2019-05-02 DIAGNOSIS — Z8674 Personal history of sudden cardiac arrest: Secondary | ICD-10-CM

## 2019-05-02 DIAGNOSIS — E7800 Pure hypercholesterolemia, unspecified: Secondary | ICD-10-CM | POA: Diagnosis present

## 2019-05-02 DIAGNOSIS — R05 Cough: Secondary | ICD-10-CM

## 2019-05-02 DIAGNOSIS — Z7982 Long term (current) use of aspirin: Secondary | ICD-10-CM | POA: Diagnosis not present

## 2019-05-02 DIAGNOSIS — I428 Other cardiomyopathies: Secondary | ICD-10-CM | POA: Diagnosis present

## 2019-05-02 DIAGNOSIS — M1990 Unspecified osteoarthritis, unspecified site: Secondary | ICD-10-CM | POA: Diagnosis present

## 2019-05-02 DIAGNOSIS — Z87891 Personal history of nicotine dependence: Secondary | ICD-10-CM | POA: Diagnosis not present

## 2019-05-02 DIAGNOSIS — Z8249 Family history of ischemic heart disease and other diseases of the circulatory system: Secondary | ICD-10-CM

## 2019-05-02 DIAGNOSIS — Z8673 Personal history of transient ischemic attack (TIA), and cerebral infarction without residual deficits: Secondary | ICD-10-CM

## 2019-05-02 DIAGNOSIS — Z87442 Personal history of urinary calculi: Secondary | ICD-10-CM

## 2019-05-02 DIAGNOSIS — E785 Hyperlipidemia, unspecified: Secondary | ICD-10-CM | POA: Diagnosis present

## 2019-05-02 DIAGNOSIS — I5023 Acute on chronic systolic (congestive) heart failure: Secondary | ICD-10-CM | POA: Diagnosis present

## 2019-05-02 LAB — CBC WITH DIFFERENTIAL/PLATELET
Abs Immature Granulocytes: 0.01 10*3/uL (ref 0.00–0.07)
Basophils Absolute: 0.1 10*3/uL (ref 0.0–0.1)
Basophils Relative: 1 %
Eosinophils Absolute: 0.2 10*3/uL (ref 0.0–0.5)
Eosinophils Relative: 3 %
HCT: 45.1 % (ref 39.0–52.0)
Hemoglobin: 14.7 g/dL (ref 13.0–17.0)
Immature Granulocytes: 0 %
Lymphocytes Relative: 23 %
Lymphs Abs: 1.8 10*3/uL (ref 0.7–4.0)
MCH: 28.8 pg (ref 26.0–34.0)
MCHC: 32.6 g/dL (ref 30.0–36.0)
MCV: 88.3 fL (ref 80.0–100.0)
Monocytes Absolute: 0.6 10*3/uL (ref 0.1–1.0)
Monocytes Relative: 8 %
Neutro Abs: 5 10*3/uL (ref 1.7–7.7)
Neutrophils Relative %: 65 %
Platelets: 222 10*3/uL (ref 150–400)
RBC: 5.11 MIL/uL (ref 4.22–5.81)
RDW: 17.9 % — ABNORMAL HIGH (ref 11.5–15.5)
WBC: 7.7 10*3/uL (ref 4.0–10.5)
nRBC: 0 % (ref 0.0–0.2)

## 2019-05-02 LAB — COMPREHENSIVE METABOLIC PANEL
ALT: 38 U/L (ref 0–44)
AST: 36 U/L (ref 15–41)
Albumin: 3.7 g/dL (ref 3.5–5.0)
Alkaline Phosphatase: 92 U/L (ref 38–126)
Anion gap: 11 (ref 5–15)
BUN: 34 mg/dL — ABNORMAL HIGH (ref 6–20)
CO2: 23 mmol/L (ref 22–32)
Calcium: 9.3 mg/dL (ref 8.9–10.3)
Chloride: 104 mmol/L (ref 98–111)
Creatinine, Ser: 2.04 mg/dL — ABNORMAL HIGH (ref 0.61–1.24)
GFR calc Af Amer: 42 mL/min — ABNORMAL LOW (ref >60)
GFR calc non Af Amer: 36 mL/min — ABNORMAL LOW (ref >60)
Glucose, Bld: 159 mg/dL — ABNORMAL HIGH (ref 70–99)
Potassium: 4.5 mmol/L (ref 3.5–5.1)
Sodium: 138 mmol/L (ref 135–145)
Total Bilirubin: 1.3 mg/dL — ABNORMAL HIGH (ref 0.3–1.2)
Total Protein: 7.2 g/dL (ref 6.5–8.1)

## 2019-05-02 LAB — TROPONIN I
Troponin I: 0.04 ng/mL (ref <0.03)
Troponin I: 0.05 ng/mL (ref <0.03)
Troponin I: 0.05 ng/mL (ref <0.03)

## 2019-05-02 LAB — BRAIN NATRIURETIC PEPTIDE: B Natriuretic Peptide: 2006 pg/mL — ABNORMAL HIGH (ref 0.0–100.0)

## 2019-05-02 MED ORDER — POLYETHYLENE GLYCOL 3350 17 G PO PACK
17.0000 g | PACK | Freq: Every day | ORAL | Status: DC
  Administered 2019-05-03 – 2019-05-04 (×2): 17 g via ORAL
  Filled 2019-05-02 (×2): qty 1

## 2019-05-02 MED ORDER — ATORVASTATIN CALCIUM 20 MG PO TABS: 40.0000 mg | ORAL_TABLET | Freq: Every day | ORAL | Status: DC

## 2019-05-02 MED ORDER — ADULT MULTIVITAMIN W/MINERALS CH
1.0000 | ORAL_TABLET | Freq: Every day | ORAL | Status: DC
  Administered 2019-05-03 – 2019-05-04 (×2): 1 via ORAL
  Filled 2019-05-02 (×2): qty 1

## 2019-05-02 MED ORDER — ALBUTEROL SULFATE (2.5 MG/3ML) 0.083% IN NEBU: 2.5000 mg | INHALATION_SOLUTION | RESPIRATORY_TRACT | Status: DC | PRN

## 2019-05-02 MED ORDER — SALINE SPRAY 0.65 % NA SOLN
1.0000 | NASAL | Status: DC | PRN
  Filled 2019-05-02: qty 44

## 2019-05-02 MED ORDER — MAGNESIUM OXIDE 400 (241.3 MG) MG PO TABS
400.0000 mg | ORAL_TABLET | Freq: Two times a day (BID) | ORAL | Status: DC
  Administered 2019-05-03 – 2019-05-04 (×3): 400 mg via ORAL
  Filled 2019-05-02 (×3): qty 1

## 2019-05-02 MED ORDER — SODIUM CHLORIDE 0.9 % IV SOLN: 250.0000 mL | INTRAVENOUS | Status: DC | PRN

## 2019-05-02 MED ORDER — DICLOFENAC SODIUM 1 % TD GEL
2.0000 g | Freq: Four times a day (QID) | TRANSDERMAL | Status: DC | PRN
  Filled 2019-05-02: qty 100

## 2019-05-02 MED ORDER — ACETAMINOPHEN 325 MG PO TABS: 650.0000 mg | ORAL_TABLET | ORAL | Status: DC | PRN

## 2019-05-02 MED ORDER — SPIRONOLACTONE 25 MG PO TABS
12.5000 mg | ORAL_TABLET | Freq: Every day | ORAL | Status: DC
  Administered 2019-05-03 – 2019-05-04 (×2): 12.5 mg via ORAL
  Filled 2019-05-02 (×2): qty 1
  Filled 2019-05-02 (×2): qty 0.5

## 2019-05-02 MED ORDER — NITROGLYCERIN 0.4 MG SL SUBL: 0.4000 mg | SUBLINGUAL_TABLET | SUBLINGUAL | Status: DC | PRN

## 2019-05-02 MED ORDER — CARVEDILOL 3.125 MG PO TABS
3.1250 mg | ORAL_TABLET | Freq: Two times a day (BID) | ORAL | Status: DC
  Administered 2019-05-03 – 2019-05-04 (×3): 3.125 mg via ORAL
  Filled 2019-05-02 (×3): qty 1

## 2019-05-02 MED ORDER — POTASSIUM CHLORIDE CRYS ER 20 MEQ PO TBCR
20.0000 meq | EXTENDED_RELEASE_TABLET | Freq: Two times a day (BID) | ORAL | Status: DC
  Administered 2019-05-02 – 2019-05-04 (×4): 20 meq via ORAL
  Filled 2019-05-02 (×4): qty 1

## 2019-05-02 MED ORDER — TIOTROPIUM BROMIDE MONOHYDRATE 18 MCG IN CAPS
18.0000 ug | ORAL_CAPSULE | Freq: Every day | RESPIRATORY_TRACT | Status: DC
  Administered 2019-05-03 – 2019-05-04 (×2): 18 ug via RESPIRATORY_TRACT
  Filled 2019-05-02: qty 5

## 2019-05-02 MED ORDER — SODIUM CHLORIDE 0.9% FLUSH
3.0000 mL | Freq: Two times a day (BID) | INTRAVENOUS | Status: DC
  Administered 2019-05-02 – 2019-05-04 (×6): 3 mL via INTRAVENOUS

## 2019-05-02 MED ORDER — METOLAZONE 2.5 MG PO TABS
2.5000 mg | ORAL_TABLET | Freq: Every day | ORAL | Status: DC
  Administered 2019-05-03 – 2019-05-04 (×2): 2.5 mg via ORAL
  Filled 2019-05-02 (×2): qty 1

## 2019-05-02 MED ORDER — HEPARIN SODIUM (PORCINE) 5000 UNIT/ML IJ SOLN: 5000.0000 [IU] | Freq: Three times a day (TID) | INTRAMUSCULAR | Status: DC

## 2019-05-02 MED ORDER — PANTOPRAZOLE SODIUM 40 MG PO TBEC
40.0000 mg | DELAYED_RELEASE_TABLET | Freq: Two times a day (BID) | ORAL | Status: DC
  Administered 2019-05-03 – 2019-05-04 (×3): 40 mg via ORAL
  Filled 2019-05-02 (×3): qty 1

## 2019-05-02 MED ORDER — CLOPIDOGREL BISULFATE 75 MG PO TABS
75.0000 mg | ORAL_TABLET | Freq: Every day | ORAL | Status: DC
  Administered 2019-05-03 – 2019-05-04 (×2): 75 mg via ORAL
  Filled 2019-05-02 (×2): qty 1

## 2019-05-02 MED ORDER — ATORVASTATIN CALCIUM 20 MG PO TABS
40.0000 mg | ORAL_TABLET | Freq: Every day | ORAL | Status: DC
  Administered 2019-05-02 – 2019-05-03 (×2): 40 mg via ORAL
  Filled 2019-05-02 (×2): qty 2

## 2019-05-02 MED ORDER — METHIMAZOLE 10 MG PO TABS
40.0000 mg | ORAL_TABLET | Freq: Every day | ORAL | Status: DC
  Administered 2019-05-03 – 2019-05-04 (×2): 40 mg via ORAL
  Filled 2019-05-02 (×2): qty 4

## 2019-05-02 MED ORDER — ASPIRIN EC 81 MG PO TBEC
81.0000 mg | DELAYED_RELEASE_TABLET | Freq: Every day | ORAL | Status: DC
  Administered 2019-05-03 – 2019-05-04 (×2): 81 mg via ORAL
  Filled 2019-05-02 (×2): qty 1

## 2019-05-02 MED ORDER — ONDANSETRON HCL 4 MG/2ML IJ SOLN: 4.0000 mg | Freq: Four times a day (QID) | INTRAMUSCULAR | Status: DC | PRN

## 2019-05-02 MED ORDER — ENOXAPARIN SODIUM 40 MG/0.4ML ~~LOC~~ SOLN
40.0000 mg | SUBCUTANEOUS | Status: DC
  Administered 2019-05-02 – 2019-05-03 (×2): 40 mg via SUBCUTANEOUS
  Filled 2019-05-02 (×2): qty 0.4

## 2019-05-02 MED ORDER — FLUTICASONE PROPIONATE 50 MCG/ACT NA SUSP
2.0000 | Freq: Every day | NASAL | Status: DC
  Administered 2019-05-03: 2 via NASAL
  Filled 2019-05-02: qty 16

## 2019-05-02 MED ORDER — MOMETASONE FURO-FORMOTEROL FUM 200-5 MCG/ACT IN AERO
2.0000 | INHALATION_SPRAY | Freq: Two times a day (BID) | RESPIRATORY_TRACT | Status: DC
  Administered 2019-05-02 – 2019-05-04 (×4): 2 via RESPIRATORY_TRACT
  Filled 2019-05-02: qty 8.8

## 2019-05-02 MED ORDER — CYCLOBENZAPRINE HCL 10 MG PO TABS: 5.0000 mg | ORAL_TABLET | Freq: Three times a day (TID) | ORAL | Status: DC | PRN

## 2019-05-02 MED ORDER — ACETAMINOPHEN 325 MG PO TABS: 650.0000 mg | ORAL_TABLET | Freq: Four times a day (QID) | ORAL | Status: DC | PRN

## 2019-05-02 MED ORDER — SENNOSIDES-DOCUSATE SODIUM 8.6-50 MG PO TABS
1.0000 | ORAL_TABLET | Freq: Every day | ORAL | Status: DC
  Administered 2019-05-03 – 2019-05-04 (×2): 1 via ORAL
  Filled 2019-05-02 (×2): qty 1

## 2019-05-02 MED ORDER — AMIODARONE HCL 200 MG PO TABS
200.0000 mg | ORAL_TABLET | Freq: Two times a day (BID) | ORAL | Status: DC
  Administered 2019-05-02 – 2019-05-04 (×4): 200 mg via ORAL
  Filled 2019-05-02 (×4): qty 1

## 2019-05-02 MED ORDER — DOXYCYCLINE HYCLATE 100 MG PO TABS
100.0000 mg | ORAL_TABLET | Freq: Two times a day (BID) | ORAL | Status: DC
  Administered 2019-05-02 – 2019-05-03 (×3): 100 mg via ORAL
  Filled 2019-05-02 (×6): qty 1

## 2019-05-02 MED ORDER — COLCHICINE 0.6 MG PO TABS: 0.6000 mg | ORAL_TABLET | Freq: Every day | ORAL | Status: DC | PRN

## 2019-05-02 MED ORDER — ALLOPURINOL 100 MG PO TABS
200.0000 mg | ORAL_TABLET | Freq: Every day | ORAL | Status: DC
  Administered 2019-05-03 – 2019-05-04 (×2): 200 mg via ORAL
  Filled 2019-05-02 (×2): qty 2

## 2019-05-02 MED ORDER — GUAIFENESIN 100 MG/5ML PO SOLN
5.0000 mL | ORAL | Status: DC | PRN
  Filled 2019-05-02: qty 5

## 2019-05-02 MED ORDER — FUROSEMIDE 10 MG/ML IJ SOLN
40.0000 mg | Freq: Once | INTRAMUSCULAR | Status: AC
  Administered 2019-05-02: 40 mg via INTRAVENOUS
  Filled 2019-05-02: qty 4

## 2019-05-02 MED ORDER — PROMETHAZINE HCL 25 MG PO TABS
12.5000 mg | ORAL_TABLET | Freq: Four times a day (QID) | ORAL | Status: DC | PRN
  Filled 2019-05-02: qty 1

## 2019-05-02 MED ORDER — FUROSEMIDE 10 MG/ML IJ SOLN
40.0000 mg | Freq: Two times a day (BID) | INTRAMUSCULAR | Status: DC
  Administered 2019-05-03 – 2019-05-04 (×3): 40 mg via INTRAVENOUS
  Filled 2019-05-02 (×3): qty 4

## 2019-05-02 MED ORDER — SODIUM CHLORIDE 0.9% FLUSH: 3.0000 mL | INTRAVENOUS | Status: DC | PRN

## 2019-05-02 NOTE — ED Notes (Signed)
Date and time results received: 05/02/19 4:53 PM    Test: Troponin  Critical Value: 0.04  Name of Provider Notified: [place], [place]

## 2019-05-02 NOTE — ED Triage Notes (Signed)
Pt to ED via POV c/o cough and shortness of breath. Pt states that shortness of breath started today. Pt is in NAD at this time.

## 2019-05-02 NOTE — ED Notes (Signed)
Checked on pt, pt asked for pillow and a meal. Pt was given both.

## 2019-05-02 NOTE — ED Provider Notes (Signed)
-----------------------------------------   6:53 PM on 05/02/2019 -----------------------------------------  EKG interpreted by me Sinus rhythm rate of 70.  Left axis, left bundle branch block.  No acute ischemic changes.  3 PVCs on the strip.   Carrie Mew, MD 05/02/19 [phone]

## 2019-05-02 NOTE — ED Notes (Signed)
ED TO INPATIENT HANDOFF REPORT  ED Nurse Name and Phone #: Byren Pankow [phone]  S Name/Age/Gender Stephen [name] 53 y.o. male Room/Bed: ED31A/ED31A  Code Status   Code Status: Full Code  Home/SNF/Other Home Patient oriented to: a&0x3 Is this baseline? Yes   Triage Complete: Triage complete  Chief Complaint shortness of breath  Triage Note Pt to ED via POV c/o cough and shortness of breath. Pt states that shortness of breath started today. Pt is in NAD at this time.    Allergies Allergies  Allergen Reactions  . Lisinopril Cough    Level of Care/Admitting Diagnosis ED Disposition    ED Disposition Condition [facility] Area: [place] [100120]  Level of Care: Telemetry [5]  Covid Evaluation: N/A  Diagnosis: Acute on chronic systolic CHF (congestive heart failure) [facility]) [[id number]]  Admitting Physician: Otila Back [[id number]]  Attending Physician: Otila Back [[id number]]  Estimated length of stay: past midnight tomorrow  Certification:: I certify this patient will need inpatient services for at least 2 midnights  PT Class (Do Not Modify): Inpatient [101]  PT Acc Code (Do Not Modify): Private [1]       B Medical/Surgery History Past Medical History:  Diagnosis Date  . AICD (automatic cardioverter/defibrillator) present   . AKI (acute kidney injury) ([place]) 12/24/2017  . Arrhythmia   . Arthritis    "lower back, knees" (06/10/2018)  . Cardiac arrest ([place]) 12/23/2017   Brief V-fib arrest  . Chest pain 09/08/2017  . CHF (congestive heart failure) (HCC)    nonischemic cardiomyopathy, EF 25%  . Gout    "on daily RX" (06/10/2018)  . Headache    "q couple months" (06/10/2018)  . High cholesterol   . History of kidney stones   . Hypertension   . Influenza A 12/24/2017  . NICM (nonischemic cardiomyopathy) ([place])   . Pneumonia 12/20/2017  . TIA (transient ischemic attack) 06/21/2016   "still affects my memory a little bit" (06/10/2018)   Past  Surgical History:  Procedure Laterality Date  . EXTERNAL FIXATION LEG Right ~ 2000   "was going bowlegged; had to brake my leg to fix it"  . HERNIA REPAIR    . ICD IMPLANT N/A 12/30/2017   Procedure: ICD IMPLANT;  Surgeon: Deboraha Sprang, MD;  Location: [place] [facility];  Service: Cardiovascular;  Laterality: N/A;  . LEFT HEART CATH AND CORONARY ANGIOGRAPHY N/A 09/09/2017   Procedure: LEFT HEART CATH AND CORONARY ANGIOGRAPHY;  Surgeon: Teodoro Spray, MD;  Location: [place] [facility];  Service: Cardiovascular;  Laterality: N/A;  . UMBILICAL HERNIA REPAIR  1990s  . V TACH ABLATION  06/10/2018  . V TACH ABLATION N/A 06/10/2018   Procedure: V TACH ABLATION;  Surgeon: [place] Grayer, MD;  Location: [place] [facility];  Service: Cardiovascular;  Laterality: N/A;  . VASECTOMY       A IV Location/Drains/Wounds Patient Lines/Drains/Airways Status   Active Line/Drains/Airways    Name:   Placement date:   Placement time:   Site:   Days:   Peripheral IV 05/02/19 Left Wrist   05/02/19    1329    Wrist   less than 1          Intake/Output Last 24 hours No intake or output data in the 24 hours ending 05/02/19 1951  Labs/Imaging Results for orders placed or performed during the hospital encounter of 05/02/19 (from the past 48 hour(s))  Comprehensive metabolic panel  Status: Abnormal   Collection Time: 05/02/19  1:29 PM  Result Value Ref Range   Sodium 138 135 - 145 mmol/L   Potassium 4.5 3.5 - 5.1 mmol/L   Chloride 104 98 - 111 mmol/L   CO2 23 22 - 32 mmol/L   Glucose, Bld 159 (H) 70 - 99 mg/dL   BUN 34 (H) 6 - 20 mg/dL   Creatinine, Ser 2.04 (H) 0.61 - 1.24 mg/dL   Calcium 9.3 8.9 - 10.3 mg/dL   Total Protein 7.2 6.5 - 8.1 g/dL   Albumin 3.7 3.5 - 5.0 g/dL   AST 36 15 - 41 U/L   ALT 38 0 - 44 U/L   Alkaline Phosphatase 92 38 - 126 U/L   Total Bilirubin 1.3 (H) 0.3 - 1.2 mg/dL   GFR calc non Af Amer 36 (L) >60 mL/min   GFR calc Af Amer 42 (L) >60 mL/min   Anion gap 11 5 -  15    Comment: Performed at [facility], [address] [place], [place] [address]  CBC with Differential     Status: Abnormal   Collection Time: 05/02/19  1:29 PM  Result Value Ref Range   WBC 7.7 4.0 - 10.5 K/uL   RBC 5.11 4.22 - 5.81 MIL/uL   Hemoglobin 14.7 13.0 - 17.0 g/dL   HCT 45.1 39.0 - 52.0 %   MCV 88.3 80.0 - 100.0 fL   MCH 28.8 26.0 - 34.0 pg   MCHC 32.6 30.0 - 36.0 g/dL   RDW 17.9 (H) 11.5 - 15.5 %   Platelets 222 150 - 400 K/uL   nRBC 0.0 0.0 - 0.2 %   Neutrophils Relative % 65 %   Neutro Abs 5.0 1.7 - 7.7 K/uL   Lymphocytes Relative 23 %   Lymphs Abs 1.8 0.7 - 4.0 K/uL   Monocytes Relative 8 %   Monocytes Absolute 0.6 0.1 - 1.0 K/uL   Eosinophils Relative 3 %   Eosinophils Absolute 0.2 0.0 - 0.5 K/uL   Basophils Relative 1 %   Basophils Absolute 0.1 0.0 - 0.1 K/uL   Immature Granulocytes 0 %   Abs Immature Granulocytes 0.01 0.00 - 0.07 K/uL    Comment: Performed at [facility], [address] [place], [place] [address]  Brain natriuretic peptide     Status: Abnormal   Collection Time: 05/02/19  1:29 PM  Result Value Ref Range   B Natriuretic Peptide 2,006.0 (H) 0.0 - 100.0 pg/mL    Comment: Performed at [facility], [address] [place], [place] [address]  Troponin I - Add-On to previous collection     Status: Abnormal   Collection Time: 05/02/19  1:29 PM  Result Value Ref Range   Troponin I 0.04 (HH) <0.03 ng/mL    Comment: CRITICAL RESULT CALLED TO, READ BACK BY AND VERIFIED WITH JENNIFER [facility] @1652  05/02/19 MJU/AKT Performed at [facility], [address] [place], [place] [address]    Dg Chest Portable 1 View  Result Date: 05/02/2019 CLINICAL DATA:  Dyspnea EXAM: PORTABLE CHEST 1 VIEW COMPARISON:  04/15/2019 chest radiograph. FINDINGS: Single lead left subclavian ICD with lead tip overlying right ventricular apex, unchanged. Stable cardiomediastinal silhouette with mild cardiomegaly. No pneumothorax. No  pleural effusion. Mild pulmonary edema. IMPRESSION: Mild congestive heart failure. Electronically Signed   By: Ilona Sorrel M.D.   On: 05/02/2019 13:32    Pending Labs [employer] (From admission, onward)    Start  Ordered   05/03/19 3300  Basic metabolic panel  Daily,   STAT     05/02/19 1909   05/03/19 0500  CBC  Tomorrow morning,   STAT     05/02/19 1916   05/03/19 0500  Magnesium  Tomorrow morning,   STAT     05/02/19 1916   05/03/19 0500  Phosphorus  Tomorrow morning,   STAT     05/02/19 1916   05/02/19 2215  Troponin I - Once  Once,   STAT     05/02/19 1916   05/02/19 1912  Troponin I - Once  Once,   STAT     05/02/19 1916          Vitals/Pain Today's Vitals   05/02/19 1258 05/02/19 1540 05/02/19 1806 05/02/19 1834  BP: (!) 119/91 (!) 173/135 (!) 143/105 105/81  Pulse:  68 87 67  Resp:  18 18 17   Temp:      TempSrc:      SpO2:  94% 97% 95%  PainSc:   0-No pain     Isolation Precautions No active isolations  Medications Medications  acetaminophen (TYLENOL) tablet 650 mg (has no administration in time range)  allopurinol (ZYLOPRIM) tablet 200 mg (has no administration in time range)  aspirin EC tablet 81 mg (has no administration in time range)  colchicine tablet 0.6 mg (has no administration in time range)  doxycycline (ADOXA) tablet 100 mg (has no administration in time range)  amiodarone (PACERONE) tablet 200 mg (has no administration in time range)  atorvastatin (LIPITOR) tablet 40 mg (has no administration in time range)  carvedilol (COREG) tablet 3.125 mg (has no administration in time range)  metolazone (ZAROXOLYN) tablet 2.5 mg (has no administration in time range)  nitroGLYCERIN (NITROSTAT) SL tablet 0.4 mg (has no administration in time range)  spironolactone (ALDACTONE) tablet 12.5 mg (has no administration in time range)  methimazole (TAPAZOLE) tablet 40 mg (has no administration in time range)  pantoprazole (PROTONIX) EC tablet 40 mg (has no  administration in time range)  polyethylene glycol (MIRALAX / GLYCOLAX) packet 17 g (has no administration in time range)  sennosides-docusate sodium (SENOKOT-S) 8.6-50 MG tablet 1 tablet (has no administration in time range)  clopidogrel (PLAVIX) tablet 75 mg (has no administration in time range)  cyclobenzaprine (FLEXERIL) tablet 5 mg (has no administration in time range)  Magnesium TABS 1 tablet (has no administration in time range)  multivitamin with minerals tablet 1 tablet (has no administration in time range)  potassium chloride SA (K-DUR) CR tablet 20 mEq (has no administration in time range)  albuterol (VENTOLIN HFA) 108 (90 Base) MCG/ACT inhaler 2 puff (has no administration in time range)  fluticasone (FLONASE) 50 MCG/ACT nasal spray 2 spray (has no administration in time range)  mometasone-formoterol (DULERA) 200-5 MCG/ACT inhaler 2 puff (has no administration in time range)  guaiFENesin (ROBITUSSIN) 100 MG/5ML solution 100 mg (has no administration in time range)  promethazine (PHENERGAN) tablet 12.5 mg (has no administration in time range)  sodium chloride (OCEAN) 0.65 % nasal spray 1 spray (has no administration in time range)  Tiotropium Bromide Monohydrate AERS 1 puff (has no administration in time range)  diclofenac sodium (VOLTAREN) 1 % transdermal gel 2 g (has no administration in time range)  sodium chloride flush (NS) 0.9 % injection 3 mL (has no administration in time range)  sodium chloride flush (NS) 0.9 % injection 3 mL (has no administration in time range)  0.9 %  sodium chloride infusion (  has no administration in time range)  acetaminophen (TYLENOL) tablet 650 mg (has no administration in time range)  ondansetron (ZOFRAN) injection 4 mg (has no administration in time range)  heparin injection 5,000 Units (has no administration in time range)  furosemide (LASIX) injection 40 mg (has no administration in time range)  furosemide (LASIX) injection 40 mg (40 mg  Intravenous Given 05/02/19 1716)    Mobility walks Low fall risk   Focused Assessments Cardiac Assessment Handoff:    Lab Results  Component Value Date   TROPONINI 0.04 ([place]) 05/02/2019   Lab Results  Component Value Date   DDIMER 1.13 (H) 09/27/2017   Does the Patient currently have chest pain? No     R Recommendations: See Admitting Provider Note  Report given to:   Additional Notes:

## 2019-05-02 NOTE — ED Provider Notes (Signed)
[facility] Emergency Department Provider Note ____________________________________________  Time seen: 1304  I have reviewed the triage vital signs and the nursing notes.  HISTORY  Chief Complaint  Shortness of Breath  HPI Stephen [name] is a 53 y.o. male with a history of ACID, CHF, HTN, and AKI, presents to the ED for evaluation of SOB. He admits to taking his home meds as prescribed. He reports a non-productive cough and SOB that began today. He denies any chest pain, nausea, or diaphoresis. He denies any sick contacts, high-risk exposures, or concern for COVID-19.  Past Medical History:  Diagnosis Date  . AICD (automatic cardioverter/defibrillator) present   . AKI (acute kidney injury) ([place]) 12/24/2017  . Arrhythmia   . Arthritis    "lower back, knees" (06/10/2018)  . Cardiac arrest ([place]) 12/23/2017   Brief V-fib arrest  . Chest pain 09/08/2017  . CHF (congestive heart failure) (HCC)    nonischemic cardiomyopathy, EF 25%  . Gout    "on daily RX" (06/10/2018)  . Headache    "q couple months" (06/10/2018)  . High cholesterol   . History of kidney stones   . Hypertension   . Influenza A 12/24/2017  . NICM (nonischemic cardiomyopathy) ([place])   . Pneumonia 12/20/2017  . TIA (transient ischemic attack) 06/21/2016   "still affects my memory a little bit" (06/10/2018)    Patient Active Problem List   Diagnosis Date Noted  . Acute on chronic systolic CHF (congestive heart failure) ([place]) 05/02/2019  . Lower GI bleed 04/18/2019  . CHF (congestive heart failure) ([place]) 04/07/2019  . Cough 03/17/2019  . 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 HTN (hypertension) 11/20/2018  . Hypokalemia 08/19/2018  . Syncope 06/30/2018  . CKD (chronic kidney disease) stage 3, GFR 30-59 ml/min (HCC) 05/13/2018  . Hyperlipidemia 05/13/2018  . ED (erectile dysfunction) 05/13/2018  . Osteoarthritis of right knee 04/30/2018  . Gout 03/26/2018  . VF (ventricular  fibrillation) ([place])   . Nonischemic cardiomyopathy ([place])   . Cardiogenic shock ([place]) 12/24/2017  . 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 History of non-ST elevation myocardial infarction (NSTEMI) 09/08/2017  . Bradycardia 08/30/2017  . Ventricular tachycardia ([place]) 07/29/2017  . COPD (chronic obstructive pulmonary disease) ([place]) 07/04/2017  . TIA (transient ischemic attack) 06/22/2016  . Benign hypertensive renal disease 06/22/2016  . Chronic systolic congestive heart failure ([place]) 01/20/2016  . Cardiomyopathy ([place]) 01/20/2016    Past Surgical History:  Procedure Laterality Date  . EXTERNAL FIXATION LEG Right ~ 2000   "was going bowlegged; had to brake my leg to fix it"  . HERNIA REPAIR    . ICD IMPLANT N/A 12/30/2017   Procedure: ICD IMPLANT;  Surgeon: Deboraha Sprang, MD;  Location: [place] [facility];  Service: Cardiovascular;  Laterality: N/A;  . LEFT HEART CATH AND CORONARY ANGIOGRAPHY N/A 09/09/2017   Procedure: LEFT HEART CATH AND CORONARY ANGIOGRAPHY;  Surgeon: Teodoro Spray, MD;  Location: [place] [facility];  Service: Cardiovascular;  Laterality: N/A;  . UMBILICAL HERNIA REPAIR  1990s  . V TACH ABLATION  06/10/2018  . V TACH ABLATION N/A 06/10/2018   Procedure: V TACH ABLATION;  Surgeon: Thompson Grayer, MD;  Location: [place] [facility];  Service: Cardiovascular;  Laterality: N/A;  . VASECTOMY      Prior to Admission medications   Medication Sig Start Date End Date Taking? Authorizing Provider  acetaminophen (TYLENOL) 325 MG tablet Take 2 tablets (650 mg total) by mouth every 6 (six) hours  as needed. Patient taking differently: Take 325-650 mg by mouth every 6 (six) hours as needed for moderate pain.  03/19/18  Yes Carrie Mew, MD  albuterol (PROVENTIL HFA;VENTOLIN HFA) 108 (90 Base) MCG/ACT inhaler Inhale 2 puffs into the lungs every 4 (four) hours as needed for wheezing or shortness of breath. 09/19/18  Yes Johnson, Megan P, DO  allopurinol (ZYLOPRIM) 100 MG tablet Take 2  tablets (200 mg total) by mouth daily. Patient taking differently: Take 100 mg by mouth 2 (two) times daily.  10/19/18  Yes Johnson, Megan P, DO  amiodarone (PACERONE) 200 MG tablet Take 200 mg by mouth 2 (two) times daily.   Yes [provider]  aspirin EC 81 MG EC tablet Take 1 tablet (81 mg total) by mouth daily. 01/01/18  Yes Daune Perch, NP  atorvastatin (LIPITOR) 40 MG tablet Take 1 tablet (40 mg total) by mouth daily at 6 PM. 04/22/19  Yes [name] A, FNP  carvedilol (COREG) 3.125 MG tablet Take 3.125 mg by mouth 2 (two) times daily with a meal.   Yes [provider]  clopidogrel (PLAVIX) 75 MG tablet TAKE ONE TABLET BY MOUTH EVERY DAY Patient taking differently: Take 75 mg by mouth daily.  02/13/19  Yes Bensimhon, Shaune Pascal, MD  colchicine 0.6 MG tablet Take 1 tablet (0.6 mg total) by mouth daily as needed (gout flare). 09/03/18  Yes Johnson, Megan P, DO  cyclobenzaprine (FLEXERIL) 5 MG tablet Take 1 tablet (5 mg total) by mouth 3 (three) times daily as needed (jaw pain). 11/24/18  Yes Nance Pear, MD  diclofenac sodium (VOLTAREN) 1 % GEL Apply 2 g topically 4 (four) times daily as needed (pain).    Yes [provider]  doxycycline (ADOXA) 100 MG tablet Take 100 mg by mouth 2 (two) times daily.   Yes [provider]  fluticasone (FLONASE) 50 MCG/ACT nasal spray Place 2 sprays into both nostrils daily. Patient taking differently: Place 2 sprays into both nostrils daily as needed for allergies.  06/20/17  Yes [name], MD  fluticasone-salmeterol (ADVAIR HFA) [id number] MCG/ACT inhaler Inhale 2 puffs into the lungs 2 (two) times daily. 08/12/18  Yes [name], MD  guaiFENesin (ROBITUSSIN) 100 MG/5ML SOLN Take 5 mLs (100 mg total) by mouth every 4 (four) hours as needed for cough or to loosen phlegm. 03/17/19  Yes Cannady, Jolene T, NP  Magnesium 400 MG TABS Take 1 tablet by mouth 2 (two) times daily. 04/22/19  Yes Hackney, Otila Kluver A, FNP  methimazole  (TAPAZOLE) 10 MG tablet Take 4 tablets (40 mg total) by mouth daily. 12/09/18  Yes Mayo, Pete Pelt, MD  metolazone (ZAROXOLYN) 2.5 MG tablet Take 2.5 mg by mouth as needed.  09/05/18  Yes [provider]  Multiple Vitamin (MULTIVITAMIN WITH MINERALS) TABS tablet Take 1 tablet by mouth daily.   Yes [provider]  nitroGLYCERIN (NITROSTAT) 0.4 MG SL tablet Place 1 tablet (0.4 mg total) under the tongue every 5 (five) minutes as needed for chest pain. 04/22/19  Yes Hackney, Otila Kluver A, FNP  pantoprazole (PROTONIX) 40 MG tablet Take 1 tablet (40 mg total) by mouth 2 (two) times daily. 03/01/19  Yes Fritzi Mandes, MD  polyethylene glycol (MIRALAX / GLYCOLAX) 17 g packet Take 17 g by mouth daily. 04/20/19  Yes Mody, Ulice Bold, MD  potassium chloride SA (K-DUR,KLOR-CON) 20 MEQ tablet Take 1 tablet (20 mEq total) by mouth 2 (two) times daily. Patient taking differently: Take 20 mEq by mouth 4 (four)  times daily.  03/01/19  Yes Fritzi Mandes, MD  promethazine (PHENERGAN) 12.5 MG tablet Take 1 tablet (12.5 mg total) by mouth every 6 (six) hours as needed for nausea or vomiting. 11/24/18  Yes Nance Pear, MD  sennosides-docusate sodium (SENOKOT-S) 8.6-50 MG tablet Take 1 tablet by mouth daily.   Yes [provider]  sodium chloride (OCEAN) 0.65 % SOLN nasal spray Place 1 spray into both nostrils as needed for congestion.   Yes [provider]  spironolactone (ALDACTONE) 25 MG tablet Take 0.5 tablets (12.5 mg total) by mouth daily. 04/15/19  Yes Vaughan Basta, MD  Tiotropium Bromide Monohydrate (SPIRIVA RESPIMAT) 2.5 MCG/ACT AERS Inhale 1 puff into the lungs daily. 08/12/18  Yes [name], MD  torsemide (DEMADEX) 20 MG tablet Take 1 tablet (20 mg total) by mouth 2 (two) times daily. 04/15/19  Yes Vaughan Basta, MD    Allergies Lisinopril  Family History  Problem Relation Age of Onset  . Hypertension Mother   . Heart failure Mother   . Hypertension Father   . CAD  Father   . Heart attack Father     Social History Social History   Tobacco Use  . Smoking status: Former Smoker    Packs/day: 0.33    Years: 33.00    Pack years: 10.89    Types: Cigarettes    Last attempt to quit: 02/20/2016    Years since quitting: 3.1  . Smokeless tobacco: Never Used  Substance Use Topics  . Alcohol use: Yes    Alcohol/week: 0.0 standard drinks    Comment: 06/10/2018 "beer once/month; if that"  . Drug use: Never    Review of Systems  Constitutional: Negative for fever. Eyes: Negative for visual changes. ENT: Negative for sore throat. Cardiovascular: Negative for chest pain. Respiratory: Positve for shortness of breath. Gastrointestinal: Negative for abdominal pain, vomiting and diarrhea. Genitourinary: Negative for dysuria. Musculoskeletal: Negative for back pain. Skin: Negative for rash. Neurological: Negative for headaches, focal weakness or numbness. ____________________________________________  PHYSICAL EXAM:  VITAL SIGNS: ED Triage Vitals  Enc Vitals Group     BP 05/02/19 1258 (!) 119/91     Pulse Rate 05/02/19 1256 (!) 102     Resp 05/02/19 1256 16     Temp 05/02/19 1256 97.8 F (36.6 C)     Temp Source 05/02/19 1256 Oral     SpO2 05/02/19 1256 99 %     Weight --      Height --      Head Circumference --      Peak Flow --      Pain Score 05/02/19 1256 0     Pain Loc --      Pain Edu? --      Excl. in [place]? --     Constitutional: Alert and oriented. Well appearing and in no distress. Head: Normocephalic and atraumatic. Eyes: Conjunctivae are normal. Normal extraocular movements Neck: Supple. No thyromegaly.  Hematological/Lymphatic/Immunological: No cervical lymphadenopathy. Cardiovascular: Normal rate, regular rhythm. Normal distal pulses. No cyanosis or edema distally.  Respiratory: Normal respiratory effort. No wheezes/rales/rhonchi. Gastrointestinal: Soft and nontender. No distention. Musculoskeletal: Nontender with normal  range of motion in all extremities.  Neurologic:  Normal gait without ataxia. Normal speech and language. No gross focal neurologic deficits are appreciated. Skin:  Skin is warm, dry and intact. No rash noted. ___________________________________________   LABS (pertinent positives/negatives) Labs Reviewed  COMPREHENSIVE METABOLIC PANEL - Abnormal; Notable for the following components:      Result  Value   Glucose, Bld 159 (*)    BUN 34 (*)    Creatinine, Ser 2.04 (*)    Total Bilirubin 1.3 (*)    GFR calc non Af Amer 36 (*)    GFR calc Af Amer 42 (*)    All other components within normal limits  CBC WITH DIFFERENTIAL/PLATELET - Abnormal; Notable for the following components:   RDW 17.9 (*)    All other components within normal limits  BRAIN NATRIURETIC PEPTIDE - Abnormal; Notable for the following components:   B Natriuretic Peptide 2,006.0 (*)    All other components within normal limits  TROPONIN I - Abnormal; Notable for the following components:   Troponin I 0.04 (*)    All other components within normal limits  BASIC METABOLIC PANEL  TROPONIN I  TROPONIN I  CBC  MAGNESIUM  PHOSPHORUS  ____________________________________________  EKG  See EKG report ____________________________________________   RADIOLOGY  CXR  IMPRESSION: Mild congestive heart failure.  I, Melvenia Needles, personally viewed and evaluated these images (plain radiographs) as part of my medical decision making, as well as reviewing the written report by the radiologist. ____________________________________________  PROCEDURES  Procedures Furosemide 40 mg IVP ____________________________________________  INITIAL IMPRESSION / ASSESSMENT AND PLAN / ED COURSE  Stephen [name] was evaluated in Emergency Department on 05/02/2019 for the symptoms described in the history of present illness. He was evaluated in the context of the global COVID-19 pandemic, which necessitated consideration that  the patient might be at risk for infection with the SARS-CoV-2 virus that causes COVID-19. Institutional protocols and algorithms that pertain to the evaluation of patients at risk for COVID-19 are in a state of rapid change based on information released by regulatory bodies including the CDC and federal and state organizations. These policies and algorithms were followed during the patient's care in the ED.  Patient with a history of CHF with EF of 10%, CKD, and HTN presents for onset of SOB today. He denies any chest pain, fevers, or syncope. He was noted to have worsening of his SOB with ambulation. He was found to have a BNP >2000 despite taking two of his furosemide pills. Other labs are stable. We will offer outpatient management versus admission. The patient prefers admission, as he has had to return to the ED in the past, for continued symptoms.   ----------------------------------------- 6:49 PM on 05/02/2019 -----------------------------------------  Dr. Stark Jock (Hospitalist) in to evaluate the patient for admission.  ____________________________________________  FINAL CLINICAL IMPRESSION(S) / ED DIAGNOSES  Final diagnoses:  Acute on chronic congestive heart failure, unspecified heart failure type [facility])      Darinda [place], Dannielle Karvonen, PA-C 05/02/19 2029    Schuyler Amor, MD 05/03/19 1243

## 2019-05-02 NOTE — H&P (Signed)
[facility] [place] NAME: Stephen [name]    MR#:  [id number]  DATE OF BIRTH:  28-Dec-1966  DATE OF ADMISSION:  05/02/2019  PRIMARY CARE PHYSICIAN: Valerie Roys, DO   REQUESTING/REFERRING PHYSICIAN: Carrie Mew  CHIEF COMPLAINT:   Chief Complaint  Patient presents with  . Shortness of Breath    HISTORY OF PRESENT ILLNESS:  Stephen [name]  is a 53 y.o. male with a known history of chronic systolic CHF status post AICD, gout, hypertension, hyperlipidemia and chronic kidney disease stage III who presented to the emergency room with worsening shortness of breath.  History suggestive of worsening orthopnea.  Denies any chest pain.  Intermittent productive cough of clear sputum.  Patient was evaluated in the emergency room and found to have elevated B-type natriuretic peptide of 2006.  Troponin negative.  Chest x-ray done with findings consistent with mild CHF exacerbation.  Patient denies any fevers.  No risk factors such as exposure to anyone with COVID infection.  Patient diagnosed with acute on chronic systolic CHF exacerbation.  Given a dose of IV Lasix.  Medical service called to admit patient for further evaluation and management.  PAST MEDICAL HISTORY:   Past Medical History:  Diagnosis Date  . AICD (automatic cardioverter/defibrillator) present   . AKI (acute kidney injury) ([place]) 12/24/2017  . Arrhythmia   . Arthritis    "lower back, knees" (06/10/2018)  . Cardiac arrest ([place]) 12/23/2017   Brief V-fib arrest  . Chest pain 09/08/2017  . CHF (congestive heart failure) ([facility])    nonischemic cardiomyopathy, EF 25%  . Gout    "on daily RX" (06/10/2018)  . Headache    "q couple months" (06/10/2018)  . High cholesterol   . History of kidney stones   . Hypertension   . Influenza A 12/24/2017  . NICM (nonischemic cardiomyopathy) ([place])   . Pneumonia 12/20/2017  . TIA (transient ischemic attack) 06/21/2016   "still affects my  memory a little bit" (06/10/2018)    PAST SURGICAL HISTORY:   Past Surgical History:  Procedure Laterality Date  . EXTERNAL FIXATION LEG Right ~ 2000   "was going bowlegged; had to brake my leg to fix it"  . HERNIA REPAIR    . ICD IMPLANT N/A 12/30/2017   Procedure: ICD IMPLANT;  Surgeon: Deboraha Sprang, MD;  Location: [facility];  Service: Cardiovascular;  Laterality: N/A;  . LEFT HEART CATH AND CORONARY ANGIOGRAPHY N/A 09/09/2017   Procedure: LEFT HEART CATH AND CORONARY ANGIOGRAPHY;  Surgeon: Teodoro Spray, MD;  Location: [facility];  Service: Cardiovascular;  Laterality: N/A;  . UMBILICAL HERNIA REPAIR  1990s  . V TACH ABLATION  06/10/2018  . V TACH ABLATION N/A 06/10/2018   Procedure: V TACH ABLATION;  Surgeon: Thompson Grayer, MD;  Location: [facility];  Service: Cardiovascular;  Laterality: N/A;  . VASECTOMY      SOCIAL HISTORY:   Social History   Tobacco Use  . Smoking status: Former Smoker    Packs/day: 0.33    Years: 33.00    Pack years: 10.89    Types: Cigarettes    Last attempt to quit: 02/20/2016    Years since quitting: 3.1  . Smokeless tobacco: Never Used  Substance Use Topics  . Alcohol use: Yes    Alcohol/week: 0.0 standard drinks    Comment: 06/10/2018 "beer once/month; if that"    FAMILY HISTORY:   Family History  Problem Relation Age of Onset  . Hypertension Mother   . Heart failure Mother   . Hypertension Father   . CAD Father   . Heart attack Father     DRUG ALLERGIES:   Allergies  Allergen Reactions  . Lisinopril Cough    REVIEW OF SYSTEMS:   Review of Systems  Constitutional: Negative for chills and fever.  HENT: Negative for hearing loss and tinnitus.   Eyes: Negative for blurred vision and double vision.  Respiratory: Positive for cough and shortness of breath.   Cardiovascular: Negative for chest pain and palpitations.  Gastrointestinal: Negative for heartburn, nausea and vomiting.  Genitourinary:  Negative for dysuria and urgency.  Musculoskeletal: Negative for myalgias and neck pain.  Neurological: Negative for dizziness and headaches.  Psychiatric/Behavioral: Negative for depression and substance abuse.    MEDICATIONS AT HOME:   Prior to Admission medications   Medication Sig Start Date End Date Taking? Authorizing Provider  acetaminophen (TYLENOL) 325 MG tablet Take 2 tablets (650 mg total) by mouth every 6 (six) hours as needed. Patient taking differently: Take 325-650 mg by mouth every 6 (six) hours as needed for moderate pain.  03/19/18   Carrie Mew, MD  albuterol (PROVENTIL HFA;VENTOLIN HFA) 108 (90 Base) MCG/ACT inhaler Inhale 2 puffs into the lungs every 4 (four) hours as needed for wheezing or shortness of breath. 09/19/18   Johnson, Megan P, DO  allopurinol (ZYLOPRIM) 100 MG tablet Take 2 tablets (200 mg total) by mouth daily. Patient taking differently: Take 100 mg by mouth daily.  10/19/18   Johnson, Megan P, DO  amiodarone (PACERONE) 200 MG tablet Take 200 mg by mouth 2 (two) times daily.    [provider]  aspirin EC 81 MG EC tablet Take 1 tablet (81 mg total) by mouth daily. 01/01/18   Daune Perch, NP  atorvastatin (LIPITOR) 40 MG tablet Take 1 tablet (40 mg total) by mouth daily at 6 PM. 04/22/19   [name], FNP  carvedilol (COREG) 3.125 MG tablet Take 3.125 mg by mouth 2 (two) times daily with a meal.    [provider]  clopidogrel (PLAVIX) 75 MG tablet TAKE ONE TABLET BY MOUTH EVERY DAY Patient taking differently: Take 75 mg by mouth daily.  02/13/19   Bensimhon, Shaune Pascal, MD  colchicine 0.6 MG tablet Take 1 tablet (0.6 mg total) by mouth daily as needed (gout flare). 09/03/18   Johnson, Megan P, DO  cyclobenzaprine (FLEXERIL) 5 MG tablet Take 1 tablet (5 mg total) by mouth 3 (three) times daily as needed (jaw pain). 11/24/18   Nance Pear, MD  diclofenac sodium (VOLTAREN) 1 % GEL Apply 2 g topically 4 (four) times daily as needed  (pain).     [provider]  doxycycline (ADOXA) 100 MG tablet Take 100 mg by mouth 2 (two) times daily.    [provider]  fluticasone (FLONASE) 50 MCG/ACT nasal spray Place 2 sprays into both nostrils daily. Patient taking differently: Place 2 sprays into both nostrils daily as needed for allergies.  06/20/17   [name], MD  fluticasone-salmeterol (ADVAIR HFA) [phone] MCG/ACT inhaler Inhale 2 puffs into the lungs 2 (two) times daily. 08/12/18   [name], MD  guaiFENesin (ROBITUSSIN) 100 MG/5ML SOLN Take 5 mLs (100 mg total) by mouth every 4 (four) hours as needed for cough or to loosen phlegm. 03/17/19   Cannady, Henrine Screws T, NP  Magnesium 400 MG TABS Take 1 tablet by mouth 2 (two) times  daily. 04/22/19   [name], FNP  methimazole (TAPAZOLE) 10 MG tablet Take 4 tablets (40 mg total) by mouth daily. 12/09/18   Mayo, Pete Pelt, MD  metolazone (ZAROXOLYN) 2.5 MG tablet Take 2.5 mg by mouth as needed.  09/05/18   [provider]  Multiple Vitamin (MULTIVITAMIN WITH MINERALS) TABS tablet Take 1 tablet by mouth daily.    [provider]  nitroGLYCERIN (NITROSTAT) 0.4 MG SL tablet Place 1 tablet (0.4 mg total) under the tongue every 5 (five) minutes as needed for chest pain. 04/22/19   [name], FNP  pantoprazole (PROTONIX) 40 MG tablet Take 1 tablet (40 mg total) by mouth 2 (two) times daily. 03/01/19   Fritzi Mandes, MD  polyethylene glycol (MIRALAX / GLYCOLAX) 17 g packet Take 17 g by mouth daily. 04/20/19   Bettey Costa, MD  potassium chloride SA (K-DUR,KLOR-CON) 20 MEQ tablet Take 1 tablet (20 mEq total) by mouth 2 (two) times daily. Patient taking differently: Take 20 mEq by mouth 4 (four) times daily.  03/01/19   Fritzi Mandes, MD  promethazine (PHENERGAN) 12.5 MG tablet Take 1 tablet (12.5 mg total) by mouth every 6 (six) hours as needed for nausea or vomiting. 11/24/18   Nance Pear, MD  sennosides-docusate sodium (SENOKOT-S) 8.6-50 MG tablet Take 1  tablet by mouth daily.    [provider]  sodium chloride (OCEAN) 0.65 % SOLN nasal spray Place 1 spray into both nostrils as needed for congestion.    [provider]  spironolactone (ALDACTONE) 25 MG tablet Take 0.5 tablets (12.5 mg total) by mouth daily. 04/15/19   Vaughan Basta, MD  Tiotropium Bromide Monohydrate (SPIRIVA RESPIMAT) 2.5 MCG/ACT AERS Inhale 1 puff into the lungs daily. 08/12/18   [name], MD  torsemide (DEMADEX) 20 MG tablet Take 1 tablet (20 mg total) by mouth 2 (two) times daily. 04/15/19   Vaughan Basta, MD      VITAL SIGNS:  Blood pressure 105/81, pulse 67, temperature 97.8 F (36.6 C), temperature source Oral, resp. rate 17, SpO2 95 %.  PHYSICAL EXAMINATION:  Physical Exam  GENERAL:  53 y.o.-year-old patient lying in the bed with no acute distress.  EYES: Pupils equal, round, reactive to light and accommodation. No scleral icterus. Extraocular muscles intact.  HEENT: Head atraumatic, normocephalic. Oropharynx and nasopharynx clear.  NECK:  Supple, no jugular venous distention. No thyroid enlargement, no tenderness.  LUNGS: Rales on both lungs. No use of accessory muscles of respiration.  CARDIOVASCULAR: S1, S2 normal. No murmurs, rubs, or gallops.  ABDOMEN: Soft, nontender, nondistended. Bowel sounds present. No organomegaly or mass.  EXTREMITIES: Trace pedal edema, cyanosis, or clubbing.  NEUROLOGIC: Cranial nerves II through XII are intact. Muscle strength 5/5 in all extremities. Sensation intact. Gait not checked.  PSYCHIATRIC: The patient is alert and oriented x 3.  SKIN: No obvious rash, lesion, or ulcer.   LABORATORY PANEL:   CBC Recent Labs  Lab 05/02/19 1329  WBC 7.7  HGB 14.7  HCT 45.1  PLT 222   ------------------------------------------------------------------------------------------------------------------  Chemistries  Recent Labs  Lab 05/02/19 1329  NA 138  K 4.5  CL 104  CO2 23  GLUCOSE 159*   BUN 34*  CREATININE 2.04*  CALCIUM 9.3  AST 36  ALT 38  ALKPHOS 92  BILITOT 1.3*   ------------------------------------------------------------------------------------------------------------------  Cardiac Enzymes Recent Labs  Lab 05/02/19 1329  TROPONINI 0.04*   ------------------------------------------------------------------------------------------------------------------  RADIOLOGY:  Dg Chest Portable 1 View  Result Date: 05/02/2019 CLINICAL DATA:  Dyspnea EXAM: PORTABLE CHEST 1 VIEW COMPARISON:  04/15/2019 chest radiograph. FINDINGS: Single lead left subclavian ICD with lead tip overlying right ventricular apex, unchanged. Stable cardiomediastinal silhouette with mild cardiomegaly. No pneumothorax. No pleural effusion. Mild pulmonary edema. IMPRESSION: Mild congestive heart failure. Electronically Signed   By: Ilona Sorrel M.D.   On: 05/02/2019 13:32      IMPRESSION AND PLAN:  Patient is a 53 year old male with history of chronic systolic CHF with ejection fraction of 10% on recent 2D echocardiogram in February 2020, hypertension, hyperlipidemia and chronic kidney disease stage III who is being admitted for management of acute on chronic systolic CHF exacerbation.  1.  Acute on chronic systolic CHF exacerbation. Patient started on diuresis with IV Lasix 40 mg every 12 hourly.  Patient noted to be on metolazone at home which was also continued.  Continue beta-blockers.  Patient allergic to ACE inhibitors.  Continue with spironolactone. Follow-up on serial cardiac enzymes to rule out acute coronary syndrome.  Recent 2D echocardiogram in February 2020 with ejection fraction of 10%.  Patient already status post AICD placement.  No indication to repeat echo at this time. Cardiology consult placed.  2.  Chronic kidney disease stage III On review of renal function over the last 1 month, appears fairly stable We will monitor renal function closely with ongoing diuresis.  3.   Hypertension Resumed home meds.  Monitor blood pressure closely.  4.  History of gout Stable.  Resumed home regimen.  DVT prophylaxis; heparin  All the records are reviewed and case discussed with ED provider. Management plans discussed with the patient, family and they are in agreement.  CODE STATUS: Full code  TOTAL TIME TAKING CARE OF THIS PATIENT: 59 minutes.    Shyann Hefner M.D on 05/02/2019 at 7:16 PM  Between 7am to 6pm - Pager - [phone]  After 6pm go to [email or web address] - Proofreader  [facility]  Office  [phone]  CC: Primary care physician; Valerie Roys, DO   Note: This dictation was prepared with Dragon dictation along with smaller phrase technology. Any transcriptional errors that result from this process are unintentional.

## 2019-05-02 NOTE — ED Notes (Addendum)
Pt ambulated with pulse ox - spo2 did drop to 85% but would rebound also immediately to 98%. Pt did c/o sob with ambulation. Hx of chf and states took an extra lasix today when he was sob. Lives at home alone. Fairly new dx of chf with cardiac defib about 1 1/2 years ago and is very worried about the chronic sob.

## 2019-05-03 LAB — CBC
HCT: 41.8 % (ref 39.0–52.0)
Hemoglobin: 13.3 g/dL (ref 13.0–17.0)
MCH: 28.7 pg (ref 26.0–34.0)
MCHC: 31.8 g/dL (ref 30.0–36.0)
MCV: 90.3 fL (ref 80.0–100.0)
Platelets: 195 10*3/uL (ref 150–400)
RBC: 4.63 MIL/uL (ref 4.22–5.81)
RDW: 17.9 % — ABNORMAL HIGH (ref 11.5–15.5)
WBC: 7.3 10*3/uL (ref 4.0–10.5)
nRBC: 0 % (ref 0.0–0.2)

## 2019-05-03 LAB — BASIC METABOLIC PANEL
Anion gap: 11 (ref 5–15)
BUN: 35 mg/dL — ABNORMAL HIGH (ref 6–20)
CO2: 27 mmol/L (ref 22–32)
Calcium: 9.2 mg/dL (ref 8.9–10.3)
Chloride: 103 mmol/L (ref 98–111)
Creatinine, Ser: 1.93 mg/dL — ABNORMAL HIGH (ref 0.61–1.24)
GFR calc Af Amer: 45 mL/min — ABNORMAL LOW (ref >60)
GFR calc non Af Amer: 39 mL/min — ABNORMAL LOW (ref >60)
Glucose, Bld: 98 mg/dL (ref 70–99)
Potassium: 4.1 mmol/L (ref 3.5–5.1)
Sodium: 141 mmol/L (ref 135–145)

## 2019-05-03 LAB — PHOSPHORUS: Phosphorus: 4.1 mg/dL (ref 2.5–4.6)

## 2019-05-03 LAB — MAGNESIUM: Magnesium: 2.3 mg/dL (ref 1.7–2.4)

## 2019-05-03 MED ORDER — ENSURE ENLIVE PO LIQD
237.0000 mL | Freq: Two times a day (BID) | ORAL | Status: DC
  Administered 2019-05-03 – 2019-05-04 (×2): 237 mL via ORAL

## 2019-05-03 NOTE — Progress Notes (Signed)
[facility] [place] NAME: Stephen [name]    MR#:  [id number]  DATE OF BIRTH:  February 23, 1966  SUBJECTIVE:   Patient presented to the hospital secondary to shortness of breath and noted to be in decompensated congestive heart failure.  Patient says shortness of breath is improved since yesterday.  He is responding to the IV diuresis.  No chest pains, cough fever or any other associated symptoms presently.  REVIEW OF SYSTEMS:    Review of Systems  Constitutional: Negative for chills and fever.  HENT: Negative for congestion and tinnitus.   Eyes: Negative for blurred vision and double vision.  Respiratory: Positive for shortness of breath. Negative for cough and wheezing.   Cardiovascular: Negative for chest pain, orthopnea and PND.  Gastrointestinal: Negative for abdominal pain, diarrhea, nausea and vomiting.  Genitourinary: Negative for dysuria and hematuria.  Neurological: Negative for dizziness, sensory change and focal weakness.  All other systems reviewed and are negative.   Nutrition: Heart Healthy Tolerating Diet: Yes Tolerating PT: Ambulatory  DRUG ALLERGIES:   Allergies  Allergen Reactions  . Lisinopril Cough    VITALS:  Blood pressure (!) 107/94, pulse 91, temperature 97.7 F (36.5 C), temperature source Oral, resp. rate 18, height 6' 1.5" (1.867 m), weight 98.4 kg, SpO2 98 %.  PHYSICAL EXAMINATION:   Physical Exam  GENERAL:  53 y.o.-year-old patient lying in bed in no acute distress.  EYES: Pupils equal, round, reactive to light and accommodation. No scleral icterus. Extraocular muscles intact.  HEENT: Head atraumatic, normocephalic. Oropharynx and nasopharynx clear.  NECK:  Supple, no jugular venous distention. No thyroid enlargement, no tenderness.  LUNGS: Normal breath sounds bilaterally, no wheezing, rales, rhonchi. No use of accessory muscles of respiration.  CARDIOVASCULAR: S1, S2 normal. II/VI SEM at LSB, No  rubs, or gallops.  ABDOMEN: Soft, nontender, nondistended. Bowel sounds present. No organomegaly or mass.  EXTREMITIES: No cyanosis, clubbing or edema b/l.    NEUROLOGIC: Cranial nerves II through XII are intact. No focal Motor or sensory deficits b/l.   PSYCHIATRIC: The patient is alert and oriented x 3.  SKIN: No obvious rash, lesion, or ulcer.    LABORATORY PANEL:   CBC Recent Labs  Lab 05/03/19 0515  WBC 7.3  HGB 13.3  HCT 41.8  PLT 195   ------------------------------------------------------------------------------------------------------------------  Chemistries  Recent Labs  Lab 05/02/19 1329 05/03/19 0515  NA 138 141  K 4.5 4.1  CL 104 103  CO2 23 27  GLUCOSE 159* 98  BUN 34* 35*  CREATININE 2.04* 1.93*  CALCIUM 9.3 9.2  MG  --  2.3  AST 36  --   ALT 38  --   ALKPHOS 92  --   BILITOT 1.3*  --    ------------------------------------------------------------------------------------------------------------------  Cardiac Enzymes Recent Labs  Lab 05/02/19 2305  TROPONINI 0.05*   ------------------------------------------------------------------------------------------------------------------  RADIOLOGY:  Dg Chest Portable 1 View  Result Date: 05/02/2019 CLINICAL DATA:  Dyspnea EXAM: PORTABLE CHEST 1 VIEW COMPARISON:  04/15/2019 chest radiograph. FINDINGS: Single lead left subclavian ICD with lead tip overlying right ventricular apex, unchanged. Stable cardiomediastinal silhouette with mild cardiomegaly. No pneumothorax. No pleural effusion. Mild pulmonary edema. IMPRESSION: Mild congestive heart failure. Electronically Signed   By: Ilona Sorrel M.D.   On: 05/02/2019 13:32     ASSESSMENT AND PLAN:   53 year old male with history of chronic systolic CHF with ejection fraction of 10% on recent 2D echocardiogram in February 2020, hypertension, hyperlipidemia and  chronic kidney disease stage III who is being admitted for management of acute on chronic systolic  CHF exacerbation.  1.  Acute on chronic systolic CHF exacerbation. -This is the cause of patient's worsening shortness of breath. - Continue diuresis with IV Lasix and patient is responding to it.  Continue to follow I's and O's and daily weights. - Continue Coreg, metolazone, Aldactone. -Appreciate cardiology input and continue diuresis for now.  2.  Chronic kidney disease stage III Patient's creatinine is close to baseline we will continue to monitor with diuresis.  3.  Hypertension-continue carvedilol.  4.  History of gout-no acute attack. -Continue allopurinol, colchicine.  5.  Hyperlipidemia-continue atorvastatin.  6.  COPD-no acute exacerbation.  Continue Dulera, Spiriva.  7.  GERD-continue Protonix.  8.  Hyperthyroidism-continue Tapazole.    All the records are reviewed and case discussed with Care Management/Social Worker. Management plans discussed with the patient, family and they are in agreement.  CODE STATUS: Full code  DVT Prophylaxis: Lovenox  TOTAL TIME TAKING CARE OF THIS PATIENT: 30 minutes.   POSSIBLE D/C IN 1-2 DAYS, DEPENDING ON CLINICAL CONDITION.   Henreitta Leber M.D on 05/03/2019 at 12:02 PM  Between 7am to 6pm - Pager - [phone]  After 6pm go to [email or web address] - [employer] [facility]  Office  [phone]  CC: Primary care physician; Valerie Roys, DO

## 2019-05-03 NOTE — Consult Note (Signed)
Cardiology Consultation Note    Patient ID: Stephen [name], MRN: [id number], DOB/AGE: 1966/03/24 53 y.o. Admit date: 05/02/2019   Date of Consult: 05/03/2019 Primary Physician: Valerie Roys, DO Primary Cardiologist: [name]  Chief Complaint: sob Reason for Consultation: Duanne Limerick Requesting MD: Dr. Verdell Carmine  HPI: Deitrick Ferreri Swager is a 53 y.o. male with history of nonischemic cardiomyopathy with an ejection fraction of 15%, history of ventricular tachycardia with an AICD in place being followed by Dr. Adam Phenix at [facility].  His most recent echo was in October 2018 showing an EF of 25% with moderate left atrial enlargement and MR.  He recently was evaluated with a tele-medicine visit with [name] and continue with his current regimen including amiodarone 200 mg twice daily carvedilol 3.125 mg twice daily and dual antiplatelet therapy with aspirin and Plavix.  He is treated with torsemide twice daily as well as torsemide as needed.  He is on spironolactone at 25 mg half a tablet daily.  He was admitted after several days per his report of a worsening shortness of breath.  He states his weight was up as well.  Patient was admitted after being noted to have BNP of 2006.  This was markedly improved from he has BMP 3 weeks ago.  Chest x-ray revealed mild pulmonary edema.  Serum troponin was negative and is of note he had normal coronary arteries by cardiac catheterization several years ago.  He apparently has been referred to [facility] for consideration for the heart transplant evaluation.  He has had no further ventricular tachycardia events from his report.  He reports compliance with his medications and denies excessive sodium intake.  His weight is down approximately 6 to 8 pounds from admission.  He states he still feels somewhat short of breath.  There is no significant peripheral edema.  Renal function is back down near his baseline.  Past Medical History:   Diagnosis Date  . AICD (automatic cardioverter/defibrillator) present   . AKI (acute kidney injury) ([place]) 12/24/2017  . Arrhythmia   . Arthritis    "lower back, knees" (06/10/2018)  . Cardiac arrest ([place]) 12/23/2017   Brief V-fib arrest  . Chest pain 09/08/2017  . CHF (congestive heart failure) (HCC)    nonischemic cardiomyopathy, EF 25%  . Gout    "on daily RX" (06/10/2018)  . Headache    "q couple months" (06/10/2018)  . High cholesterol   . History of kidney stones   . Hypertension   . Influenza A 12/24/2017  . NICM (nonischemic cardiomyopathy) ([place])   . Pneumonia 12/20/2017  . TIA (transient ischemic attack) 06/21/2016   "still affects my memory a little bit" (06/10/2018)      Surgical History:  Past Surgical History:  Procedure Laterality Date  . EXTERNAL FIXATION LEG Right ~ 2000   "was going bowlegged; had to brake my leg to fix it"  . HERNIA REPAIR    . ICD IMPLANT N/A 12/30/2017   Procedure: ICD IMPLANT;  Surgeon: Deboraha Sprang, MD;  Location: [place] [facility];  Service: Cardiovascular;  Laterality: N/A;  . LEFT HEART CATH AND CORONARY ANGIOGRAPHY N/A 09/09/2017   Procedure: LEFT HEART CATH AND CORONARY ANGIOGRAPHY;  Surgeon: Teodoro Spray, MD;  Location: [place] [facility];  Service: Cardiovascular;  Laterality: N/A;  . UMBILICAL HERNIA REPAIR  1990s  . V TACH ABLATION  06/10/2018  . V TACH ABLATION N/A 06/10/2018   Procedure: V [facility]  ABLATION;  Surgeon: Thompson Grayer, MD;  Location: [place] [facility];  Service: Cardiovascular;  Laterality: N/A;  . VASECTOMY       Home Meds: Prior to Admission medications   Medication Sig Start Date End Date Taking? Authorizing Provider  acetaminophen (TYLENOL) 325 MG tablet Take 2 tablets (650 mg total) by mouth every 6 (six) hours as needed. Patient taking differently: Take 325-650 mg by mouth every 6 (six) hours as needed for moderate pain.  03/19/18  Yes Carrie Mew, MD  albuterol (PROVENTIL HFA;VENTOLIN HFA) 108  (90 Base) MCG/ACT inhaler Inhale 2 puffs into the lungs every 4 (four) hours as needed for wheezing or shortness of breath. 09/19/18  Yes Johnson, Megan P, DO  allopurinol (ZYLOPRIM) 100 MG tablet Take 2 tablets (200 mg total) by mouth daily. Patient taking differently: Take 100 mg by mouth 2 (two) times daily.  10/19/18  Yes Johnson, Megan P, DO  amiodarone (PACERONE) 200 MG tablet Take 200 mg by mouth 2 (two) times daily.   Yes [provider]  aspirin EC 81 MG EC tablet Take 1 tablet (81 mg total) by mouth daily. 01/01/18  Yes Daune Perch, NP  atorvastatin (LIPITOR) 40 MG tablet Take 1 tablet (40 mg total) by mouth daily at 6 PM. 04/22/19  Yes [name] A, FNP  carvedilol (COREG) 3.125 MG tablet Take 3.125 mg by mouth 2 (two) times daily with a meal.   Yes [provider]  clopidogrel (PLAVIX) 75 MG tablet TAKE ONE TABLET BY MOUTH EVERY DAY Patient taking differently: Take 75 mg by mouth daily.  02/13/19  Yes Bensimhon, Shaune Pascal, MD  colchicine 0.6 MG tablet Take 1 tablet (0.6 mg total) by mouth daily as needed (gout flare). 09/03/18  Yes Johnson, Megan P, DO  cyclobenzaprine (FLEXERIL) 5 MG tablet Take 1 tablet (5 mg total) by mouth 3 (three) times daily as needed (jaw pain). 11/24/18  Yes Nance Pear, MD  diclofenac sodium (VOLTAREN) 1 % GEL Apply 2 g topically 4 (four) times daily as needed (pain).    Yes [provider]  doxycycline (ADOXA) 100 MG tablet Take 100 mg by mouth 2 (two) times daily.   Yes [provider]  fluticasone (FLONASE) 50 MCG/ACT nasal spray Place 2 sprays into both nostrils daily. Patient taking differently: Place 2 sprays into both nostrils daily as needed for allergies.  06/20/17  Yes [name], MD  fluticasone-salmeterol (ADVAIR HFA) [phone] MCG/ACT inhaler Inhale 2 puffs into the lungs 2 (two) times daily. 08/12/18  Yes [name], MD  guaiFENesin (ROBITUSSIN) 100 MG/5ML SOLN Take 5 mLs (100 mg total) by mouth every 4  (four) hours as needed for cough or to loosen phlegm. 03/17/19  Yes Cannady, Jolene T, NP  Magnesium 400 MG TABS Take 1 tablet by mouth 2 (two) times daily. 04/22/19  Yes Hackney, Otila Kluver A, FNP  methimazole (TAPAZOLE) 10 MG tablet Take 4 tablets (40 mg total) by mouth daily. 12/09/18  Yes Mayo, Pete Pelt, MD  metolazone (ZAROXOLYN) 2.5 MG tablet Take 2.5 mg by mouth as needed.  09/05/18  Yes [provider]  Multiple Vitamin (MULTIVITAMIN WITH MINERALS) TABS tablet Take 1 tablet by mouth daily.   Yes [provider]  nitroGLYCERIN (NITROSTAT) 0.4 MG SL tablet Place 1 tablet (0.4 mg total) under the tongue every 5 (five) minutes as needed for chest pain. 04/22/19  Yes Hackney, Otila Kluver A, FNP  pantoprazole (PROTONIX) 40 MG tablet Take 1 tablet (40 mg total) by  mouth 2 (two) times daily. 03/01/19  Yes Fritzi Mandes, MD  polyethylene glycol (MIRALAX / GLYCOLAX) 17 g packet Take 17 g by mouth daily. 04/20/19  Yes Mody, Ulice Bold, MD  potassium chloride SA (K-DUR,KLOR-CON) 20 MEQ tablet Take 1 tablet (20 mEq total) by mouth 2 (two) times daily. Patient taking differently: Take 20 mEq by mouth 4 (four) times daily.  03/01/19  Yes Fritzi Mandes, MD  promethazine (PHENERGAN) 12.5 MG tablet Take 1 tablet (12.5 mg total) by mouth every 6 (six) hours as needed for nausea or vomiting. 11/24/18  Yes Nance Pear, MD  sennosides-docusate sodium (SENOKOT-S) 8.6-50 MG tablet Take 1 tablet by mouth daily.   Yes [provider]  sodium chloride (OCEAN) 0.65 % SOLN nasal spray Place 1 spray into both nostrils as needed for congestion.   Yes [provider]  spironolactone (ALDACTONE) 25 MG tablet Take 0.5 tablets (12.5 mg total) by mouth daily. 04/15/19  Yes Vaughan Basta, MD  Tiotropium Bromide Monohydrate (SPIRIVA RESPIMAT) 2.5 MCG/ACT AERS Inhale 1 puff into the lungs daily. 08/12/18  Yes [name], MD  torsemide (DEMADEX) 20 MG tablet Take 1 tablet (20 mg total) by mouth 2 (two) times  daily. 04/15/19  Yes Vaughan Basta, MD    Inpatient Medications:  . allopurinol  200 mg Oral Daily  . amiodarone  200 mg Oral BID  . aspirin EC  81 mg Oral Daily  . atorvastatin  40 mg Oral q1800  . carvedilol  3.125 mg Oral BID WC  . clopidogrel  75 mg Oral Daily  . doxycycline  100 mg Oral BID  . enoxaparin (LOVENOX) injection  40 mg Subcutaneous Q24H  . feeding supplement (ENSURE ENLIVE)  237 mL Oral BID BM  . fluticasone  2 spray Each Nare Daily  . furosemide  40 mg Intravenous Q12H  . magnesium oxide  400 mg Oral BID  . methimazole  40 mg Oral Daily  . metolazone  2.5 mg Oral Daily  . mometasone-formoterol  2 puff Inhalation BID  . multivitamin with minerals  1 tablet Oral Daily  . pantoprazole  40 mg Oral BID  . polyethylene glycol  17 g Oral Daily  . potassium chloride SA  20 mEq Oral BID  . senna-docusate  1 tablet Oral Daily  . sodium chloride flush  3 mL Intravenous Q12H  . spironolactone  12.5 mg Oral Daily  . tiotropium  18 mcg Inhalation Daily   . sodium chloride      Allergies:  Allergies  Allergen Reactions  . Lisinopril Cough    Social History   Socioeconomic History  . Marital status: Single    Spouse name: Not on file  . Number of children: Not on file  . Years of education: Not on file  . Highest education level: Not on file  Occupational History  . Not on file  Social Needs  . Financial resource strain: Not on file  . Food insecurity:    Worry: Not on file    Inability: Not on file  . Transportation needs:    Medical: No    Non-medical: No  Tobacco Use  . Smoking status: Former Smoker    Packs/day: 0.33    Years: 33.00    Pack years: 10.89    Types: Cigarettes    Last attempt to quit: 02/20/2016    Years since quitting: 3.2  . Smokeless tobacco: Never Used  Substance and Sexual Activity  . Alcohol use: Yes  Alcohol/week: 0.0 standard drinks    Comment: 06/10/2018 "beer once/month; if that"  . Drug use: Never  . Sexual  activity: Yes  Lifestyle  . Physical activity:    Days per week: 2 days    Minutes per session: Not on file  . Stress: Not at all  Relationships  . Social connections:    Talks on phone: Not on file    Gets together: Not on file    Attends religious service: Not on file    Active member of club or organization: Not on file    Attends meetings of clubs or organizations: Not on file    Relationship status: Not on file  . Intimate partner violence:    Fear of current or ex partner: Not on file    Emotionally abused: Not on file    Physically abused: Not on file    Forced sexual activity: Not on file  Other Topics Concern  . Not on file  Social History Narrative   Independent at baseline, ambulates steadily     Family History  Problem Relation Age of Onset  . Hypertension Mother   . Heart failure Mother   . Hypertension Father   . CAD Father   . Heart attack Father      Review of Systems: A 12-system review of systems was performed and is negative except as noted in the HPI.  Labs: Recent Labs    05/02/19 1329 05/02/19 2014 05/02/19 2305  TROPONINI 0.04* 0.05* 0.05*   Lab Results  Component Value Date   WBC 7.3 05/03/2019   HGB 13.3 05/03/2019   HCT 41.8 05/03/2019   MCV 90.3 05/03/2019   PLT 195 05/03/2019    Recent Labs  Lab 05/02/19 1329 05/03/19 0515  NA 138 141  K 4.5 4.1  CL 104 103  CO2 23 27  BUN 34* 35*  CREATININE 2.04* 1.93*  CALCIUM 9.3 9.2  PROT 7.2  --   BILITOT 1.3*  --   ALKPHOS 92  --   ALT 38  --   AST 36  --   GLUCOSE 159* 98   Lab Results  Component Value Date   CHOL 217 (H) 12/03/2018   HDL 47 12/03/2018   LDLCALC 134 (H) 12/03/2018   TRIG 179 (H) 12/03/2018   Lab Results  Component Value Date   DDIMER 1.13 (H) 09/27/2017    Radiology/Studies:  Dg Chest 1 View  Result Date: 04/15/2019 CLINICAL DATA:  Hypoxia. EXAM: CHEST  1 VIEW COMPARISON:  April 14, 2019 FINDINGS: Stable cardiomegaly. Opacity in the left  retrocardiac region is identified. Diffuse primarily interstitial opacities. More focal opacity in the right upper lobe described on the previous study is no longer visualized. No other interval changes. IMPRESSION: 1. The findings are most consistent with cardiomegaly and pulmonary edema. Opacity in the left retrocardiac region is similar, possibly atelectasis. Electronically Signed   By: Dorise Bullion III M.D   On: 04/15/2019 08:30   Dg Chest 2 View  Result Date: 04/14/2019 CLINICAL DATA:  Hypoxia and possible pneumonia in inpatient. History of CHF. EXAM: CHEST - 2 VIEW COMPARISON:  April 07, 2019 FINDINGS: Stable cardiomegaly. The AICD device is stable. No pneumothorax. Opacity in the lateral right upper lung is more conspicuous in the interval. Prominence of central lung markings identified. No other acute abnormalities. Nodular density projected anteriorly on the lateral view. IMPRESSION: 1. Cardiomegaly. 2. Prominence of vasculature centrally is suspected to represent pulmonary venous congestion/mild edema.  3. More focal opacity in the lateral right upper lung could represent developing infiltrate. Recommend attention on short-term follow-up. 4. Nodular density projected over the anterior chest on the lateral view was not seen on the February 17, 2019 study and could represent developing rounded/nodular infiltrate, artifact, or a pulmonary nodule. Recommend attention on short-term follow-up. Electronically Signed   By: Dorise Bullion III M.D   On: 04/14/2019 14:04   Dg Chest Portable 1 View  Result Date: 05/02/2019 CLINICAL DATA:  Dyspnea EXAM: PORTABLE CHEST 1 VIEW COMPARISON:  04/15/2019 chest radiograph. FINDINGS: Single lead left subclavian ICD with lead tip overlying right ventricular apex, unchanged. Stable cardiomediastinal silhouette with mild cardiomegaly. No pneumothorax. No pleural effusion. Mild pulmonary edema. IMPRESSION: Mild congestive heart failure. Electronically Signed   By: Ilona Sorrel M.D.   On: 05/02/2019 13:32   Dg Chest Portable 1 View  Result Date: 04/07/2019 CLINICAL DATA:  Chest pain and shortness of breath for 1-2 days EXAM: PORTABLE CHEST 1 VIEW COMPARISON:  02/23/2019 FINDINGS: Cardiac shadow is enlarged but stable. Increased vascular congestion is noted when compare with the prior exam with interstitial densities consistent with edema. No focal confluent infiltrate is seen. No sizable effusion is noted. IMPRESSION: Increasing vascular congestion with interstitial edema. Electronically Signed   By: Inez Catalina M.D.   On: 04/07/2019 13:42    Wt Readings from Last 3 Encounters:  05/03/19 98.4 kg  04/30/19 101.6 kg  04/22/19 101.2 kg    EKG: Atrial sensed ventricular paced rhythm.  Physical Exam: Caucasian male in no acute distress Blood pressure (!) 107/94, pulse 91, temperature 97.7 F (36.5 C), temperature source Oral, resp. rate 18, height 6' 1.5" (1.867 m), weight 98.4 kg, SpO2 98 %. Body mass index is 28.24 kg/m. General: Well developed, well nourished, in no acute distress. Head: Normocephalic, atraumatic, sclera non-icteric, no xanthomas, nares are without discharge.  Neck: Negative for carotid bruits. JVD not elevated. Lungs: Clear bilaterally to auscultation without wheezes, rales, or rhonchi. Breathing is unlabored. Heart: RRR with S1 S2. No murmurs, rubs, or gallops appreciated. Abdomen: Soft, non-tender, non-distended with normoactive bowel sounds. No hepatomegaly. No rebound/guarding. No obvious abdominal masses. Msk:  Strength and tone appear normal for age. Extremities: No clubbing or cyanosis. No edema.  Distal pedal pulses are 2+ and equal bilaterally. Neuro: Alert and oriented X 3. No facial asymmetry. No focal deficit. Moves all extremities spontaneously. Psych:  Responds to questions appropriately with a normal affect.     Assessment and Plan  53 year old male with history of nonischemic cardiomyopathy with an AICD in place admitted  with increasing shortness of breath and weight gain at home.  He apparently is compliant with his medications.  He has a AICD in place and is on amiodarone 200 mg twice daily.  He has had no recent firings.  He was admitted after presenting to the ER with increasing shortness of breath.  His BNP was 2006.  It is of note that recent BMPs were greater than 4000.  He has improved somewhat with diuresis.  He has diuresed approximately 60 pounds since admission.  He is on as needed metolazone as well as torsemide twice daily at home.  Would continue to carefully diurese and ambulate and consider discharge later today or in the morning for follow-up with Dr. Landry Mellow would and Dr. Jolyn Nap.  Does not appear to require any invasive or noninvasive work-up at present.  Low-sodium diet was stressed.  Signed, Teodoro Spray MD 05/03/2019,  1:16 PM Pager: [phone]

## 2019-05-03 NOTE — Progress Notes (Signed)
Initial Nutrition Assessment  RD working remotely.  DOCUMENTATION CODES:   Not applicable  INTERVENTION:  Provide Ensure Enlive po BID, each supplement provides 350 kcal and 20 grams of protein. Patient prefers [employer].  Continue daily MVI.  Reviewed "Heart Failure Nutrition Therapy" handout from the Academy of [employer]etics with patient over the phone. Patient is very familiar with low sodium diet. Reviewed 2000 mg sodium limit. Discussed high-sodium foods to avoid and low-sodium foods to replace them with. Patient is usually on a 2 L fluid restriction at home so discussed that MD currently has ordered a lower fluid restriction of 1.2 L. Patient weighs himself daily on scale. Expect good compliance. No further nutrition questions/concerns from patient at end of call. Patient does not want another copy of handouts as he reports he has multiple copies at home.  NUTRITION DIAGNOSIS:   Inadequate oral intake related to decreased appetite as evidenced by per patient/family report.  GOAL:   Patient will meet greater than or equal to 90% of their needs  MONITOR:   PO intake, Supplement acceptance, Labs, Weight trends, I & O's  REASON FOR ASSESSMENT:   Consult Diet education  ASSESSMENT:   53 year old male with PMHx of HTN, CHF, hx TIA, hx brief V-fib arrest in 2018, AKI, hx placement of AICD, arthritis, gout admitted with acute on chronic systolic CHF exacerbation.   Spoke with patient over the phone. He reports his appetite is still decreased overall but is improving some since last month. His nausea is also improved. He is now eating 3 small meals per day. He continues to follow 2 gram sodium diet. For breakfast he has grits. For other meals he reports he may have a salad, [place] sandwich,  or an Ensure. He is on a 2 L fluid restriction at home. He weighs himself on digital scale daily. Meal completion not yet recorded this admission. Patient is amenable to continue  drinking Ensure while admitted to help meet calorie/protein needs.  UBW had been around 225-230 lbs. Patient lost weight during March and early April down to around 212 lbs. Patient reports he is now stable at around 215-217 lbs for the past month.  RD reviewed "Heart Failure Nutrition Therapy" handout from the Academy of [employer]etics with patient over the phone. Reviewed patient's dietary recall. Provided examples on ways to decrease sodium intake in diet. Discouraged intake of processed foods and use of salt shaker. Encouraged fresh fruits and vegetables as well as whole grain sources of carbohydrates to maximize fiber intake. RD discussed why it is important for patient to adhere to diet recommendations, and emphasized the role of fluids, foods to avoid, and importance of weighing self daily. Teach back method used. Expect good compliance.  Medications reviewed and include: allopurinol., amiodarone, carvedilol, Plavix, doxycycline, Lasix 40 mg Q12hrs IV, magnesium oxide 400 mg BID, MVI daily, pantoprazole, Miralax 17 gram daily, potassium chloride 20 mEq BID, senna-docusate 1 tablet daily, spironolactone 12.5 mg daily.  Labs reviewed: BUN 35, Creatinine 1.93. On 5/2 BNP 2006.  I/O: 575 mL UOP yesterday and 400 mL UOP so far today  NUTRITION - FOCUSED PHYSICAL EXAM:  Unable to complete at this time.  Diet Order:   Diet Order            Diet 2 gram sodium Room service appropriate? Yes; Fluid consistency: Thin; Fluid restriction: 1200 mL Fluid  Diet effective now  EDUCATION NEEDS:   Education needs have been addressed  Skin:  Skin Assessment: Reviewed RN Assessment  Last BM:  05/02/2019 per chart  Height:   Ht Readings from Last 1 Encounters:  05/02/19 6' 1.5" (1.867 m)   Weight:   Wt Readings from Last 1 Encounters:  05/03/19 98.4 kg   Ideal Body Weight:  85 kg  BMI:  Body mass index is 28.24 kg/m.  Estimated Nutritional Needs:   Kcal:   2100-2300  Protein:  105-120 grams  Fluid:  per MD  Willey Blade, MS, RD, LDN Office: [phone] Pager: [phone] After Hours/Weekend Pager: [phone]

## 2019-05-03 NOTE — Plan of Care (Signed)
Patient declines CHF packet and videos; reports has received 3 booklets on previous admissions amd viewed videos. Reports weighing daily at home and taking prescribed meds as ordered.

## 2019-05-04 ENCOUNTER — Inpatient Hospital Stay: Payer: PRIVATE HEALTH INSURANCE

## 2019-05-04 LAB — BASIC METABOLIC PANEL
Anion gap: 11 (ref 5–15)
BUN: 46 mg/dL — ABNORMAL HIGH (ref 6–20)
CO2: 24 mmol/L (ref 22–32)
Calcium: 9.4 mg/dL (ref 8.9–10.3)
Chloride: 103 mmol/L (ref 98–111)
Creatinine, Ser: 2.23 mg/dL — ABNORMAL HIGH (ref 0.61–1.24)
GFR calc Af Amer: 38 mL/min — ABNORMAL LOW (ref >60)
GFR calc non Af Amer: 32 mL/min — ABNORMAL LOW (ref >60)
Glucose, Bld: 114 mg/dL — ABNORMAL HIGH (ref 70–99)
Potassium: 4 mmol/L (ref 3.5–5.1)
Sodium: 138 mmol/L (ref 135–145)

## 2019-05-04 LAB — BRAIN NATRIURETIC PEPTIDE: B Natriuretic Peptide: 1720 pg/mL — ABNORMAL HIGH (ref 0.0–100.0)

## 2019-05-04 NOTE — Progress Notes (Signed)
Cardiovascular and Pulmonary Nurse Navigator Note:    53 year old male with hx of Chronic Systolic CHF with EF of 93% on echo performed in February 2020, HTN, HLD, and CKD III who presented to the ED with worsening SOB and orthopnea.   BNP on admission 2006 and today 1720.  CXR on admission revealed mild CHF.  CXR today impression:  Persisting mild edema, though appears improved from most recent plain film comparision.    EDUCATION:   Rounded on patient.  Patient sitting up in the recliner watching television.  Patient on room air.  Patient has just finished walking around the unit with mobility aide.  Patient informed this RN that he experienced some shortness of breath while ambulating.  Patient is thinking he will be discharged this afternoon, but does not want to leave too early and have to come right back in here.    The 5 steps to Living Better for Heart Failure reviewed with patient.    Patient has HF educational packet -  "Living Better with Heart Failure" packet at bedside.   NOTE:  HF is not a new dx for this patient.  Patient is known to [facility].  Patient is followed by the [facility].  Patient also receives Mom's Meals, meals with less than 500 mg of sodium.  (Mom's Meals is a program sponsored by [facility] specifically for Heart Failure Patients.)   ? *Reviewed importance of and reason behind checking weight daily in the AM, after using the bathroom, but before getting dressed. Patient has functioning scale and weighs daily.  ? *Reviewed with patient the following information: *Discussed when to call the Dr= weight gain of >2-3lb overnight of 5lb in a week,  *Discussed yellow zone= call MD: weight gain of >2-3lb overnight of 5lb in a week, increased swelling, increased SOB when lying down, chest discomfort, dizziness, increased fatigue *Red Zone= call 911: struggle to breath, fainting or near fainting, significant chest pain   *Diet - Reviewed low sodium  diet-provided handout of recommended and not recommended foods.  Dietitian Consultation for education completed yesterday.   ? *Discussed fluid intake with patient as well.   While hospitalized patient was on 1200 mL fluid restriction.  Prior to admission patient stated his daily allowance of fluids was 64 ounces.   ? *Instructed patient to take medications as prescribed for heart failure. Explained briefly why pt is on the medications (either make you feel better, live longer or keep you out of the hospital) and discussed monitoring and side effects. Note:  Pharmacist on unit has reviewed medications with patient.   ? *Discussed exercise. Patient completed outpatient Cardiac Rehab on 12/30/2018.  Patient ambulating around nursing station and c/o some SOB.  Encouraged patient to remain as active as possible.   ? *Smoking Cessation- Patient is a former smoker.? ? *[facility] - Patient is an established patient in the [facility]. Next appt, which will be a virtual visit is scheduled on 05/11/2019 at 10:20 a.m.   ? Again, the 5 Steps to Living Better with Heart Failure were reviewed with patient.  ? Patient thanked me for providing the above information. ? ? Roanna Epley, RN, BSN, [facility]? [facility]  Cardiovascular &?Pulmonary Nurse Navigator  Direct Line: [phone]  Department Phone #: [phone] Fax: [phone]? Email Address: [email or web address]

## 2019-05-04 NOTE — Progress Notes (Signed)
[facility] Cardiology  SUBJECTIVE: [name] is a 53 year old male with a past medical history significant for nonischemic cardiomyopathy with an EF of 15% and hx of vtach s/p AICD implantation who presented to the ED on 05/02/19 for worsening shortness of breath.  BNP was elevated at 2006 and chest xray revealed mild pulmonary edema.  Serum troponin was mildly elevated but remained flat.  He has been referred to [facility] for consideration of heart transplant, but has yet to undergo evaluation.   Today, admits to exertional shortness of breath, but denies shortness of breath at baseline.  Denies chest pain or palpitations.  Denies lower extremity swelling.  Denies dizziness, lightheadedness, or syncopal/presyncopal episodes.     Vitals:   05/04/19 0401 05/04/19 0403 05/04/19 0725 05/04/19 0728  BP: 108/90   (!) 114/94  Pulse: 72  80 92  Resp: 20     Temp: 98.4 F (36.9 C)  98.3 F (36.8 C)   TempSrc: Oral  Oral   SpO2: 97%  98%   Weight:  99 kg    Height:         Intake/Output Summary (Last 24 hours) at 05/04/2019 1191 Last data filed at 05/04/2019 4782 Gross per 24 hour  Intake 700 ml  Output 1750 ml  Net -1050 ml      PHYSICAL EXAM  General: Well developed, well nourished, in no acute distress HEENT:  Normocephalic and atramatic Neck:  No JVD.  Lungs: Clear bilaterally to auscultation and percussion. Heart: HRRR . Normal S1 and S2 without gallops or murmurs.  Abdomen: Bowel sounds are positive, abdomen soft and non-tender  Msk:  Back normal.  Normal strength and tone for age. Extremities: No clubbing, cyanosis or edema.   Neuro: Alert and oriented X 3. Psych:  Good affect, responds appropriately   LABS: Basic Metabolic Panel: Recent Labs    05/03/19 0515 05/04/19 0420  NA 141 138  K 4.1 4.0  CL 103 103  CO2 27 24  GLUCOSE 98 114*  BUN 35* 46*  CREATININE 1.93* 2.23*  CALCIUM 9.2 9.4  MG 2.3  --   PHOS 4.1  --    Liver Function Tests: Recent Labs    05/02/19 1329   AST 36  ALT 38  ALKPHOS 92  BILITOT 1.3*  PROT 7.2  ALBUMIN 3.7   No results for input(s): LIPASE, AMYLASE in the last 72 hours. CBC: Recent Labs    05/02/19 1329 05/03/19 0515  WBC 7.7 7.3  NEUTROABS 5.0  --   HGB 14.7 13.3  HCT 45.1 41.8  MCV 88.3 90.3  PLT 222 195   Cardiac Enzymes: Recent Labs    05/02/19 1329 05/02/19 2014 05/02/19 2305  TROPONINI 0.04* 0.05* 0.05*   BNP: Invalid input(s): POCBNP D-Dimer: No results for input(s): DDIMER in the last 72 hours. Hemoglobin A1C: No results for input(s): HGBA1C in the last 72 hours. Fasting Lipid Panel: No results for input(s): CHOL, HDL, LDLCALC, TRIG, CHOLHDL, LDLDIRECT in the last 72 hours. Thyroid Function Tests: No results for input(s): TSH, T4TOTAL, T3FREE, THYROIDAB in the last 72 hours.  Invalid input(s): FREET3 Anemia Panel: No results for input(s): VITAMINB12, FOLATE, FERRITIN, TIBC, IRON, RETICCTPCT in the last 72 hours.  Dg Chest Portable 1 View  Result Date: 05/02/2019 CLINICAL DATA:  Dyspnea EXAM: PORTABLE CHEST 1 VIEW COMPARISON:  04/15/2019 chest radiograph. FINDINGS: Single lead left subclavian ICD with lead tip overlying right ventricular apex, unchanged. Stable cardiomediastinal silhouette with mild cardiomegaly. No pneumothorax.  No pleural effusion. Mild pulmonary edema. IMPRESSION: Mild congestive heart failure. Electronically Signed   By: Ilona Sorrel M.D.   On: 05/02/2019 13:32     TELEMETRY: Atrial sensed, ventricular paced   ASSESSMENT AND PLAN:  Active Problems:   Acute on chronic systolic CHF (congestive heart failure) ([place])    1.  Acute on chronic systolic CHF   -Will repeat BNP and chest xray  -Continue to diuresis, daily weights, fluid restriction, and low sodium diet   -Continue carvedilol, spironolactone   -No further cardiac workup indicated at this time 2.  History of vtach, AICD in place  -Continue amiodarone 200mg  twice daily   The history, physical exam findings,  and plan of care were all discussed with Dr. Bartholome Bill, and all decision making was made in collaboration.    Avie Arenas  PA-C 05/04/2019 8:21 AM

## 2019-05-04 NOTE — TOC Initial Note (Signed)
Transition of Care [facility]) - Initial/Assessment Note    Patient Details  Name: Stephen [name] MRN: [id number] Date of Birth: 1966/09/04  Transition of Care [facility]) CM/SW Contact:    Elza Rafter, RN Phone Number: 05/04/2019, 10:42 AM  Clinical Narrative:      Patient is from home alone; independent in all adl's.  Admitted with acute on chronic CHF; SOB.  Current with PCP; obtains medications at [facility] without difficulty.  Current with the heart failure clinic.  Has a functioning scale at home and weighs daily.  No transportation issues.  He occasionally drives and also has a friend that will transport as needed.  No further needs identified at this time.  Will not qualify for home health services.   Does use paramedicine-[facility].   He is hoping to discharge this afternoon after he ambulates.                   Patient Goals and CMS Choice        Expected Discharge Plan and Services           Expected Discharge Date: 05/04/19                                    Prior Living Arrangements/Services                       Activities of Daily Living Home Assistive Devices/Equipment: Blood pressure cuff, Scales ADL Screening (condition at time of admission) Patient's cognitive ability adequate to safely complete daily activities?: Yes Is the patient deaf or have difficulty hearing?: No Does the patient have difficulty seeing, even when wearing glasses/contacts?: No Does the patient have difficulty concentrating, remembering, or making decisions?: No Patient able to express need for assistance with ADLs?: Yes Does the patient have difficulty dressing or bathing?: No Independently performs ADLs?: Yes (appropriate for developmental age) Does the patient have difficulty walking or climbing stairs?: Yes Weakness of Legs: None Weakness of Arms/Hands: None  Permission Sought/Granted                  Emotional Assessment              Admission  diagnosis:  Acute on chronic congestive heart failure, unspecified heart failure type [facility]) [I50.9] Patient Active Problem List   Diagnosis Date Noted  . Acute on chronic systolic CHF (congestive heart failure) ([place]) 05/02/2019  . Lower GI bleed 04/18/2019  . CHF (congestive heart failure) ([place]) 04/07/2019  . Cough 03/17/2019  . 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 HTN (hypertension) 11/20/2018  . Hypokalemia 08/19/2018  . Syncope 06/30/2018  . CKD (chronic kidney disease) stage 3, GFR 30-59 ml/min (HCC) 05/13/2018  . Hyperlipidemia 05/13/2018  . ED (erectile dysfunction) 05/13/2018  . Osteoarthritis of right knee 04/30/2018  . Gout 03/26/2018  . VF (ventricular fibrillation) ([place])   . Nonischemic cardiomyopathy ([place])   . Cardiogenic shock ([place]) 12/24/2017  . 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 History of non-ST elevation myocardial infarction (NSTEMI) 09/08/2017  . Bradycardia 08/30/2017  . Ventricular tachycardia ([place]) 07/29/2017  . COPD (chronic obstructive pulmonary disease) ([place]) 07/04/2017  . TIA (transient ischemic attack) 06/22/2016  . Benign hypertensive renal disease 06/22/2016  . Chronic systolic congestive heart failure ([place]) 01/20/2016  . Cardiomyopathy ([place]) 01/20/2016   PCP:  Valerie Roys, DO Pharmacy:   [facility]  [place], [place] [address] [place] [address] Phone: [phone] Fax: [phone]     Social Determinants of Health (SDOH) Interventions    Readmission Risk Interventions Readmission Risk Prevention Plan 04/08/2019  Medication Review ([place]) Complete  PCP or Specialist appointment within 3-5 days of discharge Complete  HRI or [place] Complete  SW Recovery Care/Counseling Consult Patient refused  Palliative Care Screening Not [place] Not Applicable  Some recent data might be hidden

## 2019-05-04 NOTE — Discharge Summary (Signed)
Stephen [name] NAME: Stephen [name]    MR#:  [id number]  DATE OF BIRTH:  1966/08/04  DATE OF ADMISSION:  05/02/2019 ADMITTING PHYSICIAN: Jude Ojie, MD  DATE OF DISCHARGE: 05/04/2019  PRIMARY CARE PHYSICIAN: Valerie Roys, DO    ADMISSION DIAGNOSIS:  Acute on chronic congestive heart failure, unspecified heart failure type ([place]) [I50.9]  DISCHARGE DIAGNOSIS:  Active Problems:   Acute on chronic systolic CHF (congestive heart failure) ([place])   SECONDARY DIAGNOSIS:   Past Medical History:  Diagnosis Date  . AICD (automatic cardioverter/defibrillator) present   . AKI (acute kidney injury) ([place]) 12/24/2017  . Arrhythmia   . Arthritis    "lower back, knees" (06/10/2018)  . Cardiac arrest ([place]) 12/23/2017   Brief V-fib arrest  . Chest pain 09/08/2017  . CHF (congestive heart failure) (HCC)    nonischemic cardiomyopathy, EF 25%  . Gout    "on daily RX" (06/10/2018)  . Headache    "q couple months" (06/10/2018)  . High cholesterol   . History of kidney stones   . Hypertension   . Influenza A 12/24/2017  . NICM (nonischemic cardiomyopathy) ([place])   . Pneumonia 12/20/2017  . TIA (transient ischemic attack) 06/21/2016   "still affects my memory a little bit" (06/10/2018)    HOSPITAL COURSE:   53 year old male with history of chronic systolic CHF with ejection fraction of 10% on recent 2D echocardiogram in February 2020,hypertension,hyperlipidemia and chronic kidney disease stage III who is being admitted for management of acute on chronic systolic CHF exacerbation.  1.Acute on chronic systolic CHF exacerbation. -This was the cause of patient's worsening shortness of breath. -Patient was diuresed with IV Lasix and responded well to it.  Patient has clinically improved and is no longer hypoxic.  He is therefore being discharged home on his oral torsemide, metolazone and Aldactone. -Cardiology did follow the patient agreed with  this management and he is stable for discharge now. -Patient will continue his carvedilol.  Patient should follow-up with the heart failure clinic.  2.Chronic kidney disease stage III -Patient's creatinine did somewhat rise with diuresis while in the hospital.  His creatinine still remains close to baseline and can be further followed as an outpatient.  3.Hypertension- pt. Will continue carvedilol.  4.History of gout-no acute attack. -pt. Will Continue allopurinol, colchicine.  5.  Hyperlipidemia-pt. Will continue atorvastatin.  6.  COPD-no acute exacerbation.  pt. Will Continue Advair, Spiriva, Albuterol inhaler as needed.   7.  GERD-pt. Will continue Protonix.  8.  Hyperthyroidism-pt. Will continue Tapazole.  DISCHARGE CONDITIONS:   Stable  CONSULTS OBTAINED:  Treatment Team:  Yolonda Kida, MD Teodoro Spray, MD  DRUG ALLERGIES:   Allergies  Allergen Reactions  . Lisinopril Cough    DISCHARGE MEDICATIONS:   Allergies as of 05/04/2019      Reactions   Lisinopril Cough      Medication List    STOP taking these medications   doxycycline 100 MG tablet Commonly known as:  ADOXA     TAKE these medications   acetaminophen 325 MG tablet Commonly known as:  TYLENOL Take 2 tablets (650 mg total) by mouth every 6 (six) hours as needed. What changed:    how much to take  reasons to take this   albuterol 108 (90 Base) MCG/ACT inhaler Commonly known as:  VENTOLIN HFA Inhale 2 puffs into the lungs every 4 (four) hours as needed for wheezing or shortness of breath.  allopurinol 100 MG tablet Commonly known as:  ZYLOPRIM Take 2 tablets (200 mg total) by mouth daily. What changed:    how much to take  when to take this   amiodarone 200 MG tablet Commonly known as:  PACERONE Take 200 mg by mouth 2 (two) times daily.   aspirin 81 MG EC tablet Take 1 tablet (81 mg total) by mouth daily.   atorvastatin 40 MG tablet Commonly known as:   LIPITOR Take 1 tablet (40 mg total) by mouth daily at 6 PM.   carvedilol 3.125 MG tablet Commonly known as:  COREG Take 3.125 mg by mouth 2 (two) times daily with a meal.   clopidogrel 75 MG tablet Commonly known as:  PLAVIX TAKE ONE TABLET BY MOUTH EVERY DAY   colchicine 0.6 MG tablet Take 1 tablet (0.6 mg total) by mouth daily as needed (gout flare).   cyclobenzaprine 5 MG tablet Commonly known as:  FLEXERIL Take 1 tablet (5 mg total) by mouth 3 (three) times daily as needed (jaw pain).   diclofenac sodium 1 % Gel Commonly known as:  VOLTAREN Apply 2 g topically 4 (four) times daily as needed (pain).   fluticasone 50 MCG/ACT nasal spray Commonly known as:  FLONASE Place 2 sprays into both nostrils daily. What changed:    when to take this  reasons to take this   fluticasone-salmeterol 115-21 MCG/ACT inhaler Commonly known as:  Advair HFA Inhale 2 puffs into the lungs 2 (two) times daily.   guaiFENesin 100 MG/5ML Soln Commonly known as:  ROBITUSSIN Take 5 mLs (100 mg total) by mouth every 4 (four) hours as needed for cough or to loosen phlegm.   Magnesium 400 MG Tabs Take 1 tablet by mouth 2 (two) times daily.   methimazole 10 MG tablet Commonly known as:  TAPAZOLE Take 4 tablets (40 mg total) by mouth daily.   metolazone 2.5 MG tablet Commonly known as:  ZAROXOLYN Take 2.5 mg by mouth as needed.   multivitamin with minerals Tabs tablet Take 1 tablet by mouth daily.   nitroGLYCERIN 0.4 MG SL tablet Commonly known as:  NITROSTAT Place 1 tablet (0.4 mg total) under the tongue every 5 (five) minutes as needed for chest pain.   pantoprazole 40 MG tablet Commonly known as:  PROTONIX Take 1 tablet (40 mg total) by mouth 2 (two) times daily.   polyethylene glycol 17 g packet Commonly known as:  MIRALAX / GLYCOLAX Take 17 g by mouth daily.   potassium chloride SA 20 MEQ tablet Commonly known as:  K-DUR Take 1 tablet (20 mEq total) by mouth 2 (two) times  daily. What changed:  when to take this   promethazine 12.5 MG tablet Commonly known as:  PHENERGAN Take 1 tablet (12.5 mg total) by mouth every 6 (six) hours as needed for nausea or vomiting.   sennosides-docusate sodium 8.6-50 MG tablet Commonly known as:  SENOKOT-S Take 1 tablet by mouth daily.   sodium chloride 0.65 % Soln nasal spray Commonly known as:  OCEAN Place 1 spray into both nostrils as needed for congestion.   spironolactone 25 MG tablet Commonly known as:  ALDACTONE Take 0.5 tablets (12.5 mg total) by mouth daily.   Tiotropium Bromide Monohydrate 2.5 MCG/ACT Aers Commonly known as:  Spiriva Respimat Inhale 1 puff into the lungs daily.   torsemide 20 MG tablet Commonly known as:  DEMADEX Take 1 tablet (20 mg total) by mouth 2 (two) times daily.  DISCHARGE INSTRUCTIONS:   DIET:  Cardiac diet  DISCHARGE CONDITION:  Stable  ACTIVITY:  Activity as tolerated  OXYGEN:  Home Oxygen: No.   Oxygen Delivery: room air  DISCHARGE LOCATION:  home   If you experience worsening of your admission symptoms, develop shortness of breath, life threatening emergency, suicidal or homicidal thoughts you must seek medical attention immediately by calling 911 or calling your MD immediately  if symptoms less severe.  You Must read complete instructions/literature along with all the possible adverse reactions/side effects for all the Medicines you take and that have been prescribed to you. Take any new Medicines after you have completely understood and accpet all the possible adverse reactions/side effects.   Please note  You were cared for by a hospitalist during your hospital stay. If you have any questions about your discharge medications or the care you received while you were in the hospital after you are discharged, you can call the unit and asked to speak with the hospitalist on call if the hospitalist that took care of you is not available. Once you are  discharged, your primary care physician will handle any further medical issues. Please note that NO REFILLS for any discharge medications will be authorized once you are discharged, as it is imperative that you return to your primary care physician (or establish a relationship with a primary care physician if you do not have one) for your aftercare needs so that they can reassess your need for medications and monitor your lab values.     Today   No acute events overnight.  Shortness of breath improved since admission.  Will discharge home today.  VITAL SIGNS:  Blood pressure (!) 114/94, pulse 92, temperature 98.3 F (36.8 C), temperature source Oral, resp. rate 20, height 6' 1.5" (1.867 m), weight 99 kg, SpO2 98 %.  I/O:    Intake/Output Summary (Last 24 hours) at 05/04/2019 1237 Last data filed at 05/04/2019 1007 Gross per 24 hour  Intake 340 ml  Output 1950 ml  Net -1610 ml    PHYSICAL EXAMINATION:  GENERAL:  53 y.o.-year-old patient lying in the bed in no acute distress.  EYES: Pupils equal, round, reactive to light and accommodation. No scleral icterus. Extraocular muscles intact.  HEENT: Head atraumatic, normocephalic. Oropharynx and nasopharynx clear.  NECK:  Supple, no jugular venous distention. No thyroid enlargement, no tenderness.  LUNGS: Normal breath sounds bilaterally, no wheezing, rales,rhonchi. No use of accessory muscles of respiration.  CARDIOVASCULAR: S1, S2 normal. No murmurs, rubs, or gallops.  ABDOMEN: Soft, non-tender, non-distended. Bowel sounds present. No organomegaly or mass.  EXTREMITIES: No pedal edema, cyanosis, or clubbing.  NEUROLOGIC: Cranial nerves II through XII are intact. No focal motor or sensory defecits b/l.  PSYCHIATRIC: The patient is alert and oriented x 3.  SKIN: No obvious rash, lesion, or ulcer.   DATA REVIEW:   CBC Recent Labs  Lab 05/03/19 0515  WBC 7.3  HGB 13.3  HCT 41.8  PLT 195    Chemistries  Recent Labs  Lab  05/02/19 1329 05/03/19 0515 05/04/19 0420  NA 138 141 138  K 4.5 4.1 4.0  CL 104 103 103  CO2 23 27 24   GLUCOSE 159* 98 114*  BUN 34* 35* 46*  CREATININE 2.04* 1.93* 2.23*  CALCIUM 9.3 9.2 9.4  MG  --  2.3  --   AST 36  --   --   ALT 38  --   --   ALKPHOS 92  --   --  BILITOT 1.3*  --   --     Cardiac Enzymes Recent Labs  Lab 05/02/19 2305  TROPONINI 0.05*    RADIOLOGY:  Dg Chest 2 View  Result Date: 05/04/2019 CLINICAL DATA:  53 year old male with systolic heart failure EXAM: CHEST - 2 VIEW COMPARISON:  04/15/2019, 05/02/2019 FINDINGS: Cardiomediastinal silhouette unchanged with cardiomegaly. Fullness in the central vasculature with mild interlobular septal thickening. Improved though persisting patchy airspace opacities with a basilar predominance. Unchanged cardiac AICD. No pneumothorax.  No large pleural effusion. No displaced fracture IMPRESSION: Persisting mild edema, though appears improved from most recent plain film comparison. Unchanged cardiac AICD Electronically Signed   By: Corrie Mckusick D.O.   On: 05/04/2019 09:37   Dg Chest Portable 1 View  Result Date: 05/02/2019 CLINICAL DATA:  Dyspnea EXAM: PORTABLE CHEST 1 VIEW COMPARISON:  04/15/2019 chest radiograph. FINDINGS: Single lead left subclavian ICD with lead tip overlying right ventricular apex, unchanged. Stable cardiomediastinal silhouette with mild cardiomegaly. No pneumothorax. No pleural effusion. Mild pulmonary edema. IMPRESSION: Mild congestive heart failure. Electronically Signed   By: Ilona Sorrel M.D.   On: 05/02/2019 13:32      Management plans discussed with the patient, family and they are in agreement.  CODE STATUS:     Code Status Orders  (From admission, onward)         Start     Ordered   05/02/19 1908  Full code  Continuous     05/02/19 1909        TOTAL TIME TAKING CARE OF THIS PATIENT: 40 minutes.    Henreitta Leber M.D on 05/04/2019 at 12:37 PM  Between 7am to 6pm - Pager -  [phone]  After 6pm go to [email or web address] - [employer] [place] [facility]  Office  [phone]  CC: Primary care physician; Valerie Roys, DO

## 2019-05-04 NOTE — TOC Transition Note (Signed)
Transition of Care [facility]) - CM/SW Discharge Note   Patient Details  Name: Stephen [name] MRN: [id number] Date of Birth: Jul 05, 1966  [facility]) CM/SW Contact:  Elza Rafter, RN Phone Number: 05/04/2019, 1:35 PM   Clinical Narrative:   Patient is discharging today.  No further needs identified.      Final next level of care: Home/Self Care Barriers to Discharge: No Barriers Identified   Patient Goals and CMS Choice        Discharge Placement                       Discharge Plan and Services                                     Social Determinants of Health (SDOH) Interventions     Readmission Risk Interventions Readmission Risk Prevention Plan 04/08/2019  Medication Review ([place]) Complete  PCP or Specialist appointment within 3-5 days of discharge Complete  HRI or [place] Complete  SW Recovery Care/Counseling Consult Patient refused  Palliative Care Screening Not [place] Not Applicable  Some recent data might be hidden

## 2019-05-04 NOTE — Consult Note (Signed)
Provided patient with "Living Better with Heart Failure" packet. Briefly reviewed definition of heart failure and signs and symptoms of an exacerbation.   Reviewed importance of and reason behind checking weight daily in the AM, after using the bathroom, but before getting dressed.  Discussed when to call the Dr= weight gain of >2lb overnight of 5lb in a week,   Discussed yellow zone= call MD: weight gain of >2lb overnight of 5lb in a week, increased swelling, increased SOB when lying down, chest discomfort, dizziness, increased fatigue  Red Zone= call 911: struggle to breath, fainting or near fainting, significant chest pain   Reviewed discharge HF medications: Carvedilol, Torsemide, Spironolactone, Carvedilol  Explained briefly why pt is on the medications (either make you feel better, live longer or keep you out of the hospital) and discussed monitoring and side effects  Lu Duffel, PharmD, BCPS Clinical Pharmacist 05/04/2019 11:52 AM

## 2019-05-04 NOTE — Plan of Care (Signed)
Weight gain noted since admission.  Pt reports limiting fluids but requested extra fluids (Ensure not given as requested) earlier in shift.  1200 ml fluid restiction reviewed.  Pt states 2000 ml restriction allowed at home.  Educated on need for limit with admission for shortness of breath and elevated CHF enzymes.

## 2019-05-06 ENCOUNTER — Telehealth: Payer: Self-pay

## 2019-05-06 NOTE — Telephone Encounter (Signed)
I have made the 1st attempt to contact the patient or family member in charge, in order to follow up from recently being discharged from the hospital. I left a message on voicemail but I will make another attempt at a different time.   direct call back [phone]

## 2019-05-07 ENCOUNTER — Telehealth: Payer: Self-pay

## 2019-05-07 NOTE — Telephone Encounter (Signed)
   TELEPHONE CALL NOTE  This patient has been deemed a candidate for follow-up tele-health visit to limit community exposure during the Covid-19 pandemic. I spoke with the patient via phone to discuss instructions. The patient was advised to review the section on consent for treatment as well. The patient will receive a phone call 2-3 days prior to their E-Visit at which time consent will be verbally confirmed. A Virtual Office Visit appointment type has been scheduled for 05/11/2019 with [facility]  Vonda Antigua L, CMA 05/07/2019 8:59 AM

## 2019-05-07 NOTE — Telephone Encounter (Signed)
TELEPHONE CALL NOTE  Stephen [name] has been deemed a candidate for a follow-up tele-health visit to limit community exposure during the Covid-19 pandemic. I spoke with the patient via phone to ensure availability of phone/video source, confirm preferred email & phone number, discuss instructions and expectations, and review consent.   I reminded Stephen [name] to be prepared with any vital sign and/or heart rhythm information that could potentially be obtained via home monitoring, at the time of his visit.  Finally, I reminded Stephen [name] to expect an e-mail containing a link for their video-based visit approximately 15 minutes before his visit, or alternatively, a phone call at the time of his visit if his visit is planned to be a phone encounter.  Did the patient verbally consent to treatment as below? YES  ALLRED, AMANDA L, CMA 05/07/2019 9:00 AM  CONSENT FOR TELE-HEALTH VISIT - PLEASE REVIEW  I hereby voluntarily request, consent and authorize [facility] and its employed or contracted physicians, physician assistants, nurse practitioners or other licensed health care professionals (the Practitioner), to provide me with telemedicine health care services (the "Services") as deemed necessary by the treating Practitioner. I acknowledge and consent to receive the Services by the Practitioner via telemedicine. I understand that the telemedicine visit will involve communicating with the Practitioner through telephonic communication technology and the disclosure of certain medical information by electronic transmission. I acknowledge that I have been given the opportunity to request an in-person assessment or other available alternative prior to the telemedicine visit and am voluntarily participating in the telemedicine visit.  I understand that I have the right to withhold or withdraw my consent to the use of telemedicine in the course of my care at any time, without affecting  my right to future care or treatment, and that the Practitioner or I may terminate the telemedicine visit at any time. I understand that I have the right to inspect all information obtained and/or recorded in the course of the telemedicine visit and may receive copies of available information for a reasonable fee.  I understand that some of the potential risks of receiving the Services via telemedicine include:  Marland Kitchen Delay or interruption in medical evaluation due to technological equipment failure or disruption; . Information transmitted may not be sufficient (e.g. poor resolution of images) to allow for appropriate medical decision making by the Practitioner; and/or  . In rare instances, security protocols could fail, causing a breach of personal health information.  Furthermore, I acknowledge that it is my responsibility to provide information about my medical history, conditions and care that is complete and accurate to the best of my ability. I acknowledge that Practitioner's advice, recommendations, and/or decision may be based on factors not within their control, such as incomplete or inaccurate data provided by me or lack of visual representation. I understand that the practice of medicine is not an exact science and that Practitioner makes no warranties or guarantees regarding treatment outcomes. I acknowledge that I will receive a copy of this consent concurrently upon execution via email to the email address I last provided but may also request a printed copy by calling the office of [facility].    I understand that my insurance may be billed for this visit.   I have read or had this consent read to me. . I understand the contents of this consent, which adequately explains the benefits and risks of the Services being provided via telemedicine.  Marland Kitchen  I have been provided ample opportunity to ask questions regarding this consent and the Services and have had my questions answered to my  satisfaction. . I give my informed consent for the services to be provided through the use of telemedicine in my medical care  By participating in this telemedicine visit I agree to the above.

## 2019-05-11 ENCOUNTER — Encounter (HOSPITAL_COMMUNITY): Payer: Self-pay

## 2019-05-11 ENCOUNTER — Other Ambulatory Visit: Payer: Self-pay

## 2019-05-11 ENCOUNTER — Other Ambulatory Visit (HOSPITAL_COMMUNITY): Payer: Self-pay

## 2019-05-11 ENCOUNTER — Encounter: Payer: Self-pay | Admitting: Family

## 2019-05-11 ENCOUNTER — Ambulatory Visit: Payer: PRIVATE HEALTH INSURANCE | Attending: Family | Admitting: Family

## 2019-05-11 VITALS — BP 128/56 | HR 110 | Wt 214.0 lb

## 2019-05-11 DIAGNOSIS — I5023 Acute on chronic systolic (congestive) heart failure: Secondary | ICD-10-CM

## 2019-05-11 NOTE — Progress Notes (Signed)
Today went to visit with Stephen [name] while he had a telemedicine appt with HF clinic.  When arrived he was short of breath that started yesterday, he states been vomiting all night, tachycardic and decreased appetite.  His weight is down, he states been watching his salt and fluids.  He did say did not drink but about 30 ounces fluid yesterday and did not urinate much.  He has jvd.  Lungs are clear sounding.  When Stephen [name] contacted Stephen [name] for appt, explained what I see and we both agree since he has not got appt with Advanced [facility] failure appt at [facility] yet and Dr Stephen [name] has referred him for workout for possible heart transplant maybe he should go to [facility] ER.  Patient in agreement with that.  Contacted 911 and advised need an ambulance non emergency for transport to [facility] ref cardiac problems.  Pt also complains of swelling in elbows, worse on right and hot to touch.  He states has all medications and has been taking them.  Verified medications.  He has no swelling in extremities and abdomen does not feel tight. Advised Stephen [name] to keep in touch and let me know when he gets back home.  Will continue to visit for heart failure.   [place] [phone]

## 2019-05-11 NOTE — Progress Notes (Signed)
Virtual Visit via Telephone Note    Evaluation Performed:  Follow-up visit  This visit type was conducted due to national recommendations for restrictions regarding the COVID-19 Pandemic (e.g. social distancing).  This format is felt to be most appropriate for this patient at this time.  All issues noted in this document were discussed and addressed.  No physical exam was performed (except for noted visual exam findings with Video Visits).  Please refer to the patient's chart (MyChart message for video visits and phone note for telephone visits) for the patient's consent to telehealth for [name]  Date:  05/11/2019   ID:  Stephen [name], DOB 11-09-1966, MRN [id number]  Patient Location:  [place] [address]   Provider location:   [facility] [address], [place] [address]  PCP:  Valerie Roys, DO  Cardiologist: Lujean Amel, MD Electrophysiologist:  None   Chief Complaint:  Short of breath  History of Present Illness:    Stephen [name] is a 53 y.o. male who presents via audio/video conferencing for a telehealth visit today.  Patient verified DOB and address.  The patient does not have symptoms concerning for COVID-19 infection (fever, chills, cough, or new SHORTNESS OF BREATH).   Patient is participating in paramedicine program and paramedic was on the call with patient's permission. Says that he was discharged from the hospital ~ 1 week ago and was feeling ok until yesterday. Yesterday he became more short of breath with minimal exertion and occasionally at rest. He's felt tired along with having nausea/vomiting "all night last night". Elbows are swollen and hot to touch per paramedic. Denies dizziness, swelling in legs/ abdomen or chest pain. Paramedic says that he has + JVD, HR ranges from 106-110 with room air pulse ox 83-85%. She says that he's visibly short of breath sometimes at rest. He continues  to wait on [facility] appointment regarding whether he's transplant eligible or not. Has been seen at [facility] in the past but doesn't want to return there.   Prior CV studies:   The following studies were reviewed today:  Echo report from 02/24/2019 reviewed and showed an EF of 10% along with moderate/ severe MR and trivial AR.   Past Medical History:  Diagnosis Date  . AICD (automatic cardioverter/defibrillator) present   . AKI (acute kidney injury) ([place]) 12/24/2017  . Arrhythmia   . Arthritis    "lower back, knees" (06/10/2018)  . Cardiac arrest ([place]) 12/23/2017   Brief V-fib arrest  . Chest pain 09/08/2017  . CHF (congestive heart failure) ([facility])    nonischemic cardiomyopathy, EF 25%  . Gout    "on daily RX" (06/10/2018)  . Headache    "q couple months" (06/10/2018)  . High cholesterol   . History of kidney stones   . Hypertension   . Influenza A 12/24/2017  . NICM (nonischemic cardiomyopathy) ([place])   . Pneumonia 12/20/2017  . TIA (transient ischemic attack) 06/21/2016   "still affects my memory a little bit" (06/10/2018)   Past Surgical History:  Procedure Laterality Date  . EXTERNAL FIXATION LEG Right ~ 2000   "was going bowlegged; had to brake my leg to fix it"  . HERNIA REPAIR    . ICD IMPLANT N/A 12/30/2017   Procedure: ICD IMPLANT;  Surgeon: Deboraha Sprang, MD;  Location: [facility];  Service: Cardiovascular;  Laterality: N/A;  . LEFT HEART CATH AND CORONARY  ANGIOGRAPHY N/A 09/09/2017   Procedure: LEFT HEART CATH AND CORONARY ANGIOGRAPHY;  Surgeon: Teodoro Spray, MD;  Location: [facility];  Service: Cardiovascular;  Laterality: N/A;  . UMBILICAL HERNIA REPAIR  1990s  . V TACH ABLATION  06/10/2018  . V TACH ABLATION N/A 06/10/2018   Procedure: V TACH ABLATION;  Surgeon: Thompson Grayer, MD;  Location: [facility];  Service: Cardiovascular;  Laterality: N/A;  . VASECTOMY       Current Meds  Medication Sig  . acetaminophen  (TYLENOL) 325 MG tablet Take 2 tablets (650 mg total) by mouth every 6 (six) hours as needed. (Patient taking differently: Take 325-650 mg by mouth every 6 (six) hours as needed for moderate pain. )  . albuterol (PROVENTIL HFA;VENTOLIN HFA) 108 (90 Base) MCG/ACT inhaler Inhale 2 puffs into the lungs every 4 (four) hours as needed for wheezing or shortness of breath.  . allopurinol (ZYLOPRIM) 100 MG tablet Take 2 tablets (200 mg total) by mouth daily. (Patient taking differently: Take 100 mg by mouth 2 (two) times daily. )  . amiodarone (PACERONE) 200 MG tablet Take 200 mg by mouth 2 (two) times daily.  Marland Kitchen aspirin EC 81 MG EC tablet Take 1 tablet (81 mg total) by mouth daily.  Marland Kitchen atorvastatin (LIPITOR) 40 MG tablet Take 1 tablet (40 mg total) by mouth daily at 6 PM.  . carvedilol (COREG) 3.125 MG tablet Take 3.125 mg by mouth 2 (two) times daily with a meal.  . clopidogrel (PLAVIX) 75 MG tablet TAKE ONE TABLET BY MOUTH EVERY DAY (Patient taking differently: Take 75 mg by mouth daily. )  . colchicine 0.6 MG tablet Take 1 tablet (0.6 mg total) by mouth daily as needed (gout flare).  . cyclobenzaprine (FLEXERIL) 5 MG tablet Take 1 tablet (5 mg total) by mouth 3 (three) times daily as needed (jaw pain).  Marland Kitchen diclofenac sodium (VOLTAREN) 1 % GEL Apply 2 g topically 4 (four) times daily as needed (pain).   . fluticasone (FLONASE) 50 MCG/ACT nasal spray Place 2 sprays into both nostrils daily. (Patient taking differently: Place 2 sprays into both nostrils daily as needed for allergies. )  . fluticasone-salmeterol (ADVAIR HFA) 115-21 MCG/ACT inhaler Inhale 2 puffs into the lungs 2 (two) times daily.  Marland Kitchen guaiFENesin (ROBITUSSIN) 100 MG/5ML SOLN Take 5 mLs (100 mg total) by mouth every 4 (four) hours as needed for cough or to loosen phlegm.  . Magnesium 400 MG TABS Take 1 tablet by mouth 2 (two) times daily.  . methimazole (TAPAZOLE) 10 MG tablet Take 4 tablets (40 mg total) by mouth daily.  . metolazone (ZAROXOLYN)  2.5 MG tablet Take 2.5 mg by mouth as needed.   . Multiple Vitamin (MULTIVITAMIN WITH MINERALS) TABS tablet Take 1 tablet by mouth daily.  . nitroGLYCERIN (NITROSTAT) 0.4 MG SL tablet Place 1 tablet (0.4 mg total) under the tongue every 5 (five) minutes as needed for chest pain.  . pantoprazole (PROTONIX) 40 MG tablet Take 1 tablet (40 mg total) by mouth 2 (two) times daily.  . polyethylene glycol (MIRALAX / GLYCOLAX) 17 g packet Take 17 g by mouth daily.  . potassium chloride SA (K-DUR,KLOR-CON) 20 MEQ tablet Take 1 tablet (20 mEq total) by mouth 2 (two) times daily. (Patient taking differently: Take 20 mEq by mouth 4 (four) times daily. )  . promethazine (PHENERGAN) 12.5 MG tablet Take 1 tablet (12.5 mg total) by mouth every 6 (six) hours as needed for nausea or vomiting.  Marland Kitchen  sennosides-docusate sodium (SENOKOT-S) 8.6-50 MG tablet Take 1 tablet by mouth daily.  . sodium chloride (OCEAN) 0.65 % SOLN nasal spray Place 1 spray into both nostrils as needed for congestion.  Marland Kitchen spironolactone (ALDACTONE) 25 MG tablet Take 0.5 tablets (12.5 mg total) by mouth daily.  . Tiotropium Bromide Monohydrate (SPIRIVA RESPIMAT) 2.5 MCG/ACT AERS Inhale 1 puff into the lungs daily.  Marland Kitchen torsemide (DEMADEX) 20 MG tablet Take 1 tablet (20 mg total) by mouth 2 (two) times daily.     Allergies:   Lisinopril   Social History   Tobacco Use  . Smoking status: Former Smoker    Packs/day: 0.33    Years: 33.00    Pack years: 10.89    Types: Cigarettes    Last attempt to quit: 02/20/2016    Years since quitting: 3.2  . Smokeless tobacco: Never Used  Substance Use Topics  . Alcohol use: Yes    Alcohol/week: 0.0 standard drinks    Comment: 06/10/2018 "beer once/month; if that"  . Drug use: Never     Family Hx: The patient's family history includes CAD in his father; Heart attack in his father; Heart failure in his mother; Hypertension in his father and mother.  ROS:   Please see the history of present illness.      All other systems reviewed and are negative.   Labs/Other Tests and Data Reviewed:    Recent Labs: 02/18/2019: TSH <0.010 05/02/2019: ALT 38 05/03/2019: Hemoglobin 13.3; Magnesium 2.3; Platelets 195 05/04/2019: B Natriuretic Peptide 1,720.0; BUN 46; Creatinine, Ser 2.23; Potassium 4.0; Sodium 138   Recent Lipid Panel Lab Results  Component Value Date/Time   CHOL 217 (H) 12/03/2018 06:57 PM   CHOL 184 09/19/2018 03:30 PM   TRIG 179 (H) 12/03/2018 06:57 PM   HDL 47 12/03/2018 06:57 PM   HDL 42 09/19/2018 03:30 PM   CHOLHDL 4.6 12/03/2018 06:57 PM   LDLCALC 134 (H) 12/03/2018 06:57 PM   LDLCALC 123 (H) 09/19/2018 03:30 PM    Wt Readings from Last 3 Encounters:  05/11/19 214 lb (97.1 kg)  05/04/19 218 lb 4.8 oz (99 kg)  04/30/19 224 lb (101.6 kg)     Exam:    Vital Signs:  BP (!) 128/56 Comment: paramedicine reading  Pulse (!) 110 Comment: paramedicine  Wt 214 lb (97.1 kg) Comment: self-reported  SpO2 (!) 83% Comment: paramedicine  BMI 27.85 kg/m    Well nourished, well developed male in no  acute distress.   ASSESSMENT & PLAN:    1. Acute on chronic heart failure with reduced ejection fraction- - NYHA class IV based on patient's and paramedic's description of symptoms  - volume overloaded based on paramedic stating + JVD, short of breath at rest - weighing daily and today's weight was 214 pounds which is down from previous weight of 223 pounds; reminded to call for an overnight weight gain of >2 pounds or a weekly weight gain of >5 pounds - based on patient's symptoms with low pulse ox of 83-85%, advised patient that he should present back to the ED - discussed going to [facility] ED so that [facility] cardiology can see him and get the HF Clinic there involved; he is wanting to see if he is transplant eligible as he's said that he's not interested in LVAD - with patient's extremely low EF and recurrent admissions, he needs close follow-up either at [facility] or back at [facility] if he'll  go - paramedic Steffanie Dunn) says that she'll arrange transportation  to get him to [facility] - patient to let me know once he returns home  COVID-19 Education: The signs and symptoms of COVID-19 were discussed with the patient and how to seek care for testing (follow up with PCP or arrange E-visit).  The importance of social distancing was discussed today.  Patient Risk:   After full review of this patients clinical status, I feel that they are at least moderate risk at this time.  Time:   Today, I have spent 6 minutes with the patient with telehealth technology discussing symptoms.     Medication Adjustments/Labs and Tests Ordered: Current medicines are reviewed at length with the patient today.  Concerns regarding medicines are outlined above.   Tests Ordered: No orders of the defined types were placed in this encounter.  Medication Changes: No orders of the defined types were placed in this encounter.   Disposition:  Follow-up pending [facility] evaluation  Signed, [name], FNP  05/11/2019 10:22 AM    [facility]

## 2019-05-11 NOTE — Patient Instructions (Signed)
Continue weighing daily and call for an overnight weight gain of > 2 pounds or a weekly weight gain of >5 pounds.  Let me know once you get home from [facility].

## 2019-05-12 ENCOUNTER — Ambulatory Visit: Payer: Commercial Managed Care - PPO | Admitting: Family

## 2019-05-12 ENCOUNTER — Ambulatory Visit: Payer: Commercial Managed Care - PPO | Admitting: Family Medicine

## 2019-05-12 ENCOUNTER — Telehealth (HOSPITAL_COMMUNITY): Payer: Self-pay

## 2019-05-12 NOTE — Telephone Encounter (Signed)
Contacted Stephen [name] this morning to check on his visit at [facility] ED.  He stated sent him home yesterday, said he has touch of pneumonia.  No cardiologist visit in ED, said he will have to wait for appt with transplant team.  He states on antibiotics.  Will continue to visit with him for heart failure.   [place] [phone]

## 2019-05-13 ENCOUNTER — Encounter: Payer: Self-pay | Admitting: Family Medicine

## 2019-05-13 ENCOUNTER — Other Ambulatory Visit: Payer: Self-pay

## 2019-05-13 ENCOUNTER — Inpatient Hospital Stay: Payer: PRIVATE HEALTH INSURANCE | Admitting: Family Medicine

## 2019-05-13 ENCOUNTER — Telehealth (INDEPENDENT_AMBULATORY_CARE_PROVIDER_SITE_OTHER): Payer: PRIVATE HEALTH INSURANCE | Admitting: Family Medicine

## 2019-05-13 VITALS — BP 118/67 | Wt 219.0 lb

## 2019-05-13 DIAGNOSIS — J189 Pneumonia, unspecified organism: Secondary | ICD-10-CM

## 2019-05-13 DIAGNOSIS — R7989 Other specified abnormal findings of blood chemistry: Secondary | ICD-10-CM | POA: Diagnosis not present

## 2019-05-13 DIAGNOSIS — N183 Chronic kidney disease, stage 3 unspecified: Secondary | ICD-10-CM

## 2019-05-13 DIAGNOSIS — I129 Hypertensive chronic kidney disease with stage 1 through stage 4 chronic kidney disease, or unspecified chronic kidney disease: Secondary | ICD-10-CM | POA: Diagnosis not present

## 2019-05-13 DIAGNOSIS — J181 Lobar pneumonia, unspecified organism: Secondary | ICD-10-CM | POA: Diagnosis not present

## 2019-05-13 DIAGNOSIS — J410 Simple chronic bronchitis: Secondary | ICD-10-CM

## 2019-05-13 DIAGNOSIS — I5022 Chronic systolic (congestive) heart failure: Secondary | ICD-10-CM

## 2019-05-13 MED ORDER — SODIUM CHLORIDE FLUSH 0.9 % IV SOLN
5.00 | INTRAVENOUS | Status: DC
Start: ? — End: 2019-05-13

## 2019-05-13 MED ORDER — LIDOCAINE HCL (PF) 1 % IJ SOLN
0.50 | INTRAMUSCULAR | Status: DC
Start: ? — End: 2019-05-13

## 2019-05-13 NOTE — Progress Notes (Addendum)
BP 118/67 Comment: pt reported   Wt 219 lb (99.3 kg) Comment: pt reported   BMI 28.50 kg/m    Subjective:    Patient ID: Stephen [name], male    DOB: [birth year], 53 y.o.   MRN: [id number]  HPI: Stephen [name] is a 53 y.o. male  Chief Complaint  Patient presents with   Pneumonia    er f/u   Congestive Heart Failure     This visit was completed via telephone due to the restrictions of the COVID-19 pandemic. All issues as above were discussed and addressed but no physical exam was performed. If it was felt that the patient should be evaluated in the office, they were directed there. The patient verbally consented to this visit. Patient was unable to complete an audio/visual visit due to technical difficulties. Due to the catastrophic nature of the COVID-19 pandemic, this visit was done through audio contact only.  Location of the patient: home  Location of the provider: home  Those involved with this call:   Provider: Merrie Roof, PA-C  CMA: Yvonna Alanis, [place] Desk/Registration: Jill Side   Time spent on call: 20 minutes on the phone discussing health concerns. 10 minutes total spent in review of patient's record and preparation of their chart. I verified patient identity using two factors (patient name and date of birth). Patient consents verbally to being seen via telemedicine visit today.   Patient here today for ER f/u from [facility] ER. Presented for SOB and weakness that had started the day before in addition to productive cough. Hx of COPD and significant CHF with last echo showing EF of 10%. Per record review, just discharged from [facility] last week for CHF exacerbation. Reports he's been compliant with home regimen and OV's with care team and no sick contacts. Labs significant for elevated serum cr. Of 2.4 which is above baseline for him, elevated CO2, elevated WBC count of 10.0, BNP elevated at 11,650, elevated troponins. Negative COVID 19 testing. CXR  showing probable RLL infiltrate, which is thought to be current cause of his sxs. Was started on ceftin and doxycycline which he's tolerating well. Feeling symptomatically improved. States SOB is largely back to baseline, weights stable, no new swelling issues, and no fevers, cough has improved, and no worsened CP since d/c home. Does note some abnormal urination since being home, urinating less than expected. Has appt with HF clinic next week for f/u.   Relevant past medical, surgical, family and social history reviewed and updated as indicated. Interim medical history since our last visit reviewed. Allergies and medications reviewed and updated.  Review of Systems  Per HPI unless specifically indicated above     Objective:    BP 118/67 Comment: pt reported   Wt 219 lb (99.3 kg) Comment: pt reported   BMI 28.50 kg/m   Wt Readings from Last 3 Encounters:  05/13/19 219 lb (99.3 kg)  05/11/19 214 lb (97.1 kg)  05/11/19 214 lb (97.1 kg)    Physical Exam Unable to perform PE due to technical difficulties with video technology on patient's end.  Results for orders placed or performed during the hospital encounter of 05/02/19  Comprehensive metabolic panel  Result Value Ref Range   Sodium 138 135 - 145 mmol/L   Potassium 4.5 3.5 - 5.1 mmol/L   Chloride 104 98 - 111 mmol/L   CO2 23 22 - 32 mmol/L   Glucose, Bld 159 (H) 70 - 99 mg/dL   BUN  34 (H) 6 - 20 mg/dL   Creatinine, Ser 2.04 (H) 0.61 - 1.24 mg/dL   Calcium 9.3 8.9 - 10.3 mg/dL   Total Protein 7.2 6.5 - 8.1 g/dL   Albumin 3.7 3.5 - 5.0 g/dL   AST 36 15 - 41 U/L   ALT 38 0 - 44 U/L   Alkaline Phosphatase 92 38 - 126 U/L   Total Bilirubin 1.3 (H) 0.3 - 1.2 mg/dL   GFR calc non Af Amer 36 (L) >60 mL/min   GFR calc Af Amer 42 (L) >60 mL/min   Anion gap 11 5 - 15  CBC with Differential  Result Value Ref Range   WBC 7.7 4.0 - 10.5 K/uL   RBC 5.11 4.22 - 5.81 MIL/uL   Hemoglobin 14.7 13.0 - 17.0 g/dL   HCT 45.1 39.0 - 52.0 %    MCV 88.3 80.0 - 100.0 fL   MCH 28.8 26.0 - 34.0 pg   MCHC 32.6 30.0 - 36.0 g/dL   RDW 17.9 (H) 11.5 - 15.5 %   Platelets 222 150 - 400 K/uL   nRBC 0.0 0.0 - 0.2 %   Neutrophils Relative % 65 %   Neutro Abs 5.0 1.7 - 7.7 K/uL   Lymphocytes Relative 23 %   Lymphs Abs 1.8 0.7 - 4.0 K/uL   Monocytes Relative 8 %   Monocytes Absolute 0.6 0.1 - 1.0 K/uL   Eosinophils Relative 3 %   Eosinophils Absolute 0.2 0.0 - 0.5 K/uL   Basophils Relative 1 %   Basophils Absolute 0.1 0.0 - 0.1 K/uL   Immature Granulocytes 0 %   Abs Immature Granulocytes 0.01 0.00 - 0.07 K/uL  Brain natriuretic peptide  Result Value Ref Range   B Natriuretic Peptide 2,006.0 (H) 0.0 - 100.0 pg/mL  Troponin I - Add-On to previous collection  Result Value Ref Range   Troponin I 0.04 (HH) <0.03 ng/mL  Basic metabolic panel  Result Value Ref Range   Sodium 141 135 - 145 mmol/L   Potassium 4.1 3.5 - 5.1 mmol/L   Chloride 103 98 - 111 mmol/L   CO2 27 22 - 32 mmol/L   Glucose, Bld 98 70 - 99 mg/dL   BUN 35 (H) 6 - 20 mg/dL   Creatinine, Ser 1.93 (H) 0.61 - 1.24 mg/dL   Calcium 9.2 8.9 - 10.3 mg/dL   GFR calc non Af Amer 39 (L) >60 mL/min   GFR calc Af Amer 45 (L) >60 mL/min   Anion gap 11 5 - 15  Troponin I - Once  Result Value Ref Range   Troponin I 0.05 (HH) <0.03 ng/mL  Troponin I - Once  Result Value Ref Range   Troponin I 0.05 (HH) <0.03 ng/mL  CBC  Result Value Ref Range   WBC 7.3 4.0 - 10.5 K/uL   RBC 4.63 4.22 - 5.81 MIL/uL   Hemoglobin 13.3 13.0 - 17.0 g/dL   HCT 41.8 39.0 - 52.0 %   MCV 90.3 80.0 - 100.0 fL   MCH 28.7 26.0 - 34.0 pg   MCHC 31.8 30.0 - 36.0 g/dL   RDW 17.9 (H) 11.5 - 15.5 %   Platelets 195 150 - 400 K/uL   nRBC 0.0 0.0 - 0.2 %  Magnesium  Result Value Ref Range   Magnesium 2.3 1.7 - 2.4 mg/dL  Phosphorus  Result Value Ref Range   Phosphorus 4.1 2.5 - 4.6 mg/dL  Basic metabolic panel  Result Value Ref Range  Sodium 138 135 - 145 mmol/L   Potassium 4.0 3.5 - 5.1 mmol/L    Chloride 103 98 - 111 mmol/L   CO2 24 22 - 32 mmol/L   Glucose, Bld 114 (H) 70 - 99 mg/dL   BUN 46 (H) 6 - 20 mg/dL   Creatinine, Ser 2.23 (H) 0.61 - 1.24 mg/dL   Calcium 9.4 8.9 - 10.3 mg/dL   GFR calc non Af Amer 32 (L) >60 mL/min   GFR calc Af Amer 38 (L) >60 mL/min   Anion gap 11 5 - 15  Brain natriuretic peptide  Result Value Ref Range   B Natriuretic Peptide 1,720.0 (H) 0.0 - 100.0 pg/mL      Assessment & Plan:   Problem List Items Addressed This Visit      Cardiovascular and Mediastinum   Chronic systolic congestive heart failure (HCC)    Per our conversation, stable at the moment. Will await HF Clinic f/u next week. Continue current regimen including sodium restriction, daily weights. Return precautions given.         Respiratory   COPD (chronic obstructive pulmonary disease) (HCC) (Chronic)    Continue home regimen, call if SOB worsening         Genitourinary   Benign hypertensive renal disease    Home BPs stable and under good control per patient, continue current regimen      CKD (chronic kidney disease) stage 3, GFR 30-59 ml/min (HCC)    Elevated Cr during ER visit, will recheck labs now that he's symptomatically improved including a U/A due to mild change in urinary habits noticed by patient      Relevant Orders   UA/M w/rflx Culture, Routine (Completed)   Basic Metabolic Panel (BMET) (Completed)    Other Visit Diagnoses    Pneumonia of right lower lobe due to infectious organism ([place])    -  Primary   Appears to be improving with doxy and ceftin. Complete abx course and f/u if sxs returning for repeat CXR   Relevant Medications   doxycycline (VIBRAMYCIN) 100 MG capsule   cefUROXime (CEFTIN) 250 MG tablet   Abnormal CBC       Will recheck given mild elevation and probable pneumonia found in ER.    Relevant Orders   CBC with Differential/Platelet (Completed)       Follow up plan: Return for as scheduled.

## 2019-05-15 ENCOUNTER — Other Ambulatory Visit: Payer: Self-pay

## 2019-05-15 ENCOUNTER — Other Ambulatory Visit: Payer: PRIVATE HEALTH INSURANCE

## 2019-05-15 DIAGNOSIS — R7989 Other specified abnormal findings of blood chemistry: Secondary | ICD-10-CM

## 2019-05-15 DIAGNOSIS — N183 Chronic kidney disease, stage 3 unspecified: Secondary | ICD-10-CM

## 2019-05-15 LAB — UA/M W/RFLX CULTURE, ROUTINE
Bilirubin, UA: NEGATIVE
Glucose, UA: NEGATIVE
Ketones, UA: NEGATIVE
Leukocytes,UA: NEGATIVE
Nitrite, UA: NEGATIVE
Protein,UA: NEGATIVE
Specific Gravity, UA: 1.01 (ref 1.005–1.030)
Urobilinogen, Ur: 0.2 mg/dL (ref 0.2–1.0)
pH, UA: 5 (ref 5.0–7.5)

## 2019-05-15 LAB — MICROSCOPIC EXAMINATION

## 2019-05-16 LAB — CBC WITH DIFFERENTIAL/PLATELET
Basophils Absolute: 0.1 10*3/uL (ref 0.0–0.2)
Basos: 1 %
EOS (ABSOLUTE): 0.1 10*3/uL (ref 0.0–0.4)
Eos: 1 %
Hematocrit: 45.5 % (ref 37.5–51.0)
Hemoglobin: 14.7 g/dL (ref 13.0–17.7)
Immature Grans (Abs): 0 10*3/uL (ref 0.0–0.1)
Immature Granulocytes: 0 %
Lymphocytes Absolute: 1.6 10*3/uL (ref 0.7–3.1)
Lymphs: 20 %
MCH: 29 pg (ref 26.6–33.0)
MCHC: 32.3 g/dL (ref 31.5–35.7)
MCV: 90 fL (ref 79–97)
Monocytes Absolute: 0.7 10*3/uL (ref 0.1–0.9)
Monocytes: 8 %
Neutrophils Absolute: 5.6 10*3/uL (ref 1.4–7.0)
Neutrophils: 70 %
Platelets: 254 10*3/uL (ref 150–450)
RBC: 5.07 x10E6/uL (ref 4.14–5.80)
RDW: 15.4 % (ref 11.6–15.4)
WBC: 8 10*3/uL (ref 3.4–10.8)

## 2019-05-16 LAB — BASIC METABOLIC PANEL
BUN/Creatinine Ratio: 19 (ref 9–20)
BUN: 36 mg/dL — ABNORMAL HIGH (ref 6–24)
CO2: 18 mmol/L — ABNORMAL LOW (ref 20–29)
Calcium: 9.5 mg/dL (ref 8.7–10.2)
Chloride: 105 mmol/L (ref 96–106)
Creatinine, Ser: 1.87 mg/dL — ABNORMAL HIGH (ref 0.76–1.27)
GFR calc Af Amer: 46 mL/min/{1.73_m2} — ABNORMAL LOW (ref >59)
GFR calc non Af Amer: 40 mL/min/{1.73_m2} — ABNORMAL LOW (ref >59)
Glucose: 140 mg/dL — ABNORMAL HIGH (ref 65–99)
Potassium: 4.9 mmol/L (ref 3.5–5.2)
Sodium: 137 mmol/L (ref 134–144)

## 2019-05-16 NOTE — Assessment & Plan Note (Signed)
Elevated Cr during ER visit, will recheck labs now that he's symptomatically improved including a U/A due to mild change in urinary habits noticed by patient

## 2019-05-16 NOTE — Assessment & Plan Note (Signed)
Continue home regimen, call if SOB worsening

## 2019-05-16 NOTE — Assessment & Plan Note (Signed)
Per our conversation, stable at the moment. Will await HF Clinic f/u next week. Continue current regimen including sodium restriction, daily weights. Return precautions given.

## 2019-05-16 NOTE — Assessment & Plan Note (Signed)
Home BPs stable and under good control per patient, continue current regimen

## 2019-05-18 ENCOUNTER — Telehealth: Payer: Self-pay

## 2019-05-18 NOTE — Telephone Encounter (Signed)
   TELEPHONE CALL NOTE  This patient has been deemed a candidate for follow-up tele-health visit to limit community exposure during the Covid-19 pandemic. I spoke with the patient via phone to discuss instructions. The patient was advised to review the section on consent for treatment as well. The patient will receive a phone call 2-3 days prior to their E-Visit at which time consent will be verbally confirmed. A Virtual Office Visit appointment type has been scheduled for 05/21/2019 with [facility].  Gaylord Shih, CMA 05/18/2019 1:42 PM

## 2019-05-18 NOTE — Telephone Encounter (Signed)
TELEPHONE CALL NOTE  Stephen [name] has been deemed a candidate for a follow-up tele-health visit to limit community exposure during the Covid-19 pandemic. I spoke with the patient via phone to ensure availability of phone/video source, confirm preferred email & phone number, discuss instructions and expectations, and review consent.   I reminded Stephen [name] to be prepared with any vital sign and/or heart rhythm information that could potentially be obtained via home monitoring, at the time of his visit.  Finally, I reminded Stephen [name] to expect an e-mail containing a link for their video-based visit approximately 15 minutes before his visit, or alternatively, a phone call at the time of his visit if his visit is planned to be a phone encounter.  Did the patient verbally consent to treatment as below? YES  ,  L, CMA 05/18/2019 1:42 PM  CONSENT FOR TELE-HEALTH VISIT - PLEASE REVIEW  I hereby voluntarily request, consent and authorize [facility] and its employed or contracted physicians, physician assistants, nurse practitioners or other licensed health care professionals (the Practitioner), to provide me with telemedicine health care services (the "Services") as deemed necessary by the treating Practitioner. I acknowledge and consent to receive the Services by the Practitioner via telemedicine. I understand that the telemedicine visit will involve communicating with the Practitioner through telephonic communication technology and the disclosure of certain medical information by electronic transmission. I acknowledge that I have been given the opportunity to request an in-person assessment or other available alternative prior to the telemedicine visit and am voluntarily participating in the telemedicine visit.  I understand that I have the right to withhold or withdraw my consent to the use of telemedicine in the course of my care at any time, without  affecting my right to future care or treatment, and that the Practitioner or I may terminate the telemedicine visit at any time. I understand that I have the right to inspect all information obtained and/or recorded in the course of the telemedicine visit and may receive copies of available information for a reasonable fee.  I understand that some of the potential risks of receiving the Services via telemedicine include:  Marland Kitchen Delay or interruption in medical evaluation due to technological equipment failure or disruption; . Information transmitted may not be sufficient (e.g. poor resolution of images) to allow for appropriate medical decision making by the Practitioner; and/or  . In rare instances, security protocols could fail, causing a breach of personal health information.  Furthermore, I acknowledge that it is my responsibility to provide information about my medical history, conditions and care that is complete and accurate to the best of my ability. I acknowledge that Practitioner's advice, recommendations, and/or decision may be based on factors not within their control, such as incomplete or inaccurate data provided by me or lack of visual representation. I understand that the practice of medicine is not an exact science and that Practitioner makes no warranties or guarantees regarding treatment outcomes. I acknowledge that I will receive a copy of this consent concurrently upon execution via email to the email address I last provided but may also request a printed copy by calling the office of [facility].    I understand that my insurance may be billed for this visit.   I have read or had this consent read to me. . I understand the contents of this consent, which adequately explains the benefits and risks of the Services being provided via telemedicine.  Marland Kitchen  I have been provided ample opportunity to ask questions regarding this consent and the Services and have had my questions  answered to my satisfaction. . I give my informed consent for the services to be provided through the use of telemedicine in my medical care  By participating in this telemedicine visit I agree to the above.

## 2019-05-20 ENCOUNTER — Telehealth: Payer: Self-pay | Admitting: Family Medicine

## 2019-05-20 NOTE — Telephone Encounter (Signed)
Patient is scheduled for a mychart visit tomorrow @ 11 but he wanted me to ask if there was something he could take for the pain in the meantime before the appointment.    Thank You

## 2019-05-21 ENCOUNTER — Ambulatory Visit: Payer: PRIVATE HEALTH INSURANCE | Attending: Family | Admitting: Family

## 2019-05-21 ENCOUNTER — Encounter: Payer: Self-pay | Admitting: Family Medicine

## 2019-05-21 ENCOUNTER — Telehealth: Payer: Self-pay | Admitting: Family Medicine

## 2019-05-21 ENCOUNTER — Other Ambulatory Visit: Payer: Self-pay

## 2019-05-21 ENCOUNTER — Other Ambulatory Visit (HOSPITAL_COMMUNITY): Payer: Self-pay

## 2019-05-21 ENCOUNTER — Encounter (HOSPITAL_COMMUNITY): Payer: Self-pay

## 2019-05-21 ENCOUNTER — Encounter: Payer: Self-pay | Admitting: Family

## 2019-05-21 ENCOUNTER — Telehealth (INDEPENDENT_AMBULATORY_CARE_PROVIDER_SITE_OTHER): Payer: PRIVATE HEALTH INSURANCE | Admitting: Family Medicine

## 2019-05-21 VITALS — BP 110/65 | HR 86 | Wt 220.0 lb

## 2019-05-21 VITALS — Wt 220.0 lb

## 2019-05-21 DIAGNOSIS — J181 Lobar pneumonia, unspecified organism: Secondary | ICD-10-CM

## 2019-05-21 DIAGNOSIS — I10 Essential (primary) hypertension: Secondary | ICD-10-CM

## 2019-05-21 DIAGNOSIS — M5442 Lumbago with sciatica, left side: Secondary | ICD-10-CM

## 2019-05-21 DIAGNOSIS — I5022 Chronic systolic (congestive) heart failure: Secondary | ICD-10-CM

## 2019-05-21 DIAGNOSIS — N183 Chronic kidney disease, stage 3 unspecified: Secondary | ICD-10-CM

## 2019-05-21 DIAGNOSIS — J189 Pneumonia, unspecified organism: Secondary | ICD-10-CM

## 2019-05-21 MED ORDER — CYCLOBENZAPRINE HCL 10 MG PO TABS: 10.0000 mg | ORAL_TABLET | Freq: Every day | ORAL | 0 refills | Status: DC

## 2019-05-21 NOTE — Patient Instructions (Signed)
Continue weighing daily and call for an overnight weight gain of > 2 pounds or a weekly weight gain of >5 pounds. 

## 2019-05-21 NOTE — Telephone Encounter (Signed)
I don't know what pain he's referring to- I'll discuss this with him at his appointment today.

## 2019-05-21 NOTE — Telephone Encounter (Signed)
Oops- please add to Rx #[id number] with 12 refills.  Thanks

## 2019-05-21 NOTE — Telephone Encounter (Signed)
Done

## 2019-05-21 NOTE — Progress Notes (Signed)
Today was a visit at [employer] [facility].  He looks good, breathing is a lot better today.  He appears relaxed.  He states feels better.  He states still has some of a cough from pneumonia, wants to take creomulsion.  He has not heard from [facility] clinic yet.  Contacted them and they had the referral, he now has appt June 19 at 12:40.  He also has a telemedicine appt with Otila Kluver while Im here.  Meds verified.  Lungs sounds clear.  He has some swelling in left ankle area but he states feels like gout.  Right has none.  He states he does not feel fluid overloaded.  He is eating Moms meals.  He watches sodium and how much he fluid he drinks.  He has not been exercising as much, his back has been hurting.  Feeling better today, he states will start walking when rain stops in a couple of days.  He has PCP appt today also.  He denies nausea, vomiting and diarrhea.  He denies chest pain, shortness of breath at rest, headaches or dizziness.  He has to pick up medications today he is running out of, he goes through drive thru.  He tries his best to stay out of public right now.  Will continue to visit for heart failure.   [place] [phone]

## 2019-05-21 NOTE — Telephone Encounter (Signed)
Copied from [place] [id number]. Topic: General - Other >> May 21, 2019 11:33 AM Oneta Rack wrote: Relation to pt: self   Call back number: [phone]   Reason for call:   Patient states he forgot to mention at today Virtual Visit via Telephone , the hospital recommended a month ago for patient to start taking ensures. Patient states it can become costly and would like PCP to write a Rx and he will pick up  hard copy, please note on script strawberry ensure Rx, please advise >> May 21, 2019 11:52 AM Don Perking M wrote: It would not let me add CCM but I do know that Merlene Morse has been able to get some for other pts.

## 2019-05-21 NOTE — Telephone Encounter (Signed)
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 Thanks!

## 2019-05-21 NOTE — Progress Notes (Signed)
Virtual Visit via Telephone Note    Evaluation Performed:  Follow-up visit  This visit type was conducted due to national recommendations for restrictions regarding the COVID-19 Pandemic (e.g. social distancing).  This format is felt to be most appropriate for this patient at this time.  All issues noted in this document were discussed and addressed.  No physical exam was performed (except for noted visual exam findings with Video Visits).  Please refer to the patient's chart (MyChart message for video visits and phone note for telephone visits) for the patient's consent to telehealth for [facility]  Date:  05/21/2019   ID:  Stephen [name], DOB 30-Dec-1966, MRN [id number]  Patient Location:  [address] [place] [address]   Provider location:   [facility] [place] [address], [place] [address]  PCP:  Valerie Roys, DO  Cardiologist:  Lujean Amel, MD Electrophysiologist:  Virl Axe, MD  Chief Complaint:  Shortness of breath  History of Present Illness:    Stephen [name] is a 53 y.o. male who presents via audio/video conferencing for a telehealth visit today.  Patient verified DOB and address.  The patient does not have symptoms concerning for COVID-19 infection (fever, chills, cough, or new SHORTNESS OF BREATH).   Patient reports moderate shortness of breath upon minimal exertion. He describes this as chronic in nature having been present for several years. He has associated back pain, swelling in his left foot, cough and fatigue along with this. He denies any dizziness, palpitations, chest pain, difficulty sleeping or weight gain. Has recently been to the ED and was diagnosed with pneumonia.   Prior CV studies:   The following studies were reviewed today:  Echo report from 02/24/2019 reviewed and showed an EF of 10% with moderate/ severe MR.   Past Medical History:  Diagnosis Date  . AICD (automatic  cardioverter/defibrillator) present   . AKI (acute kidney injury) ([place]) 12/24/2017  . Arrhythmia   . Arthritis    "lower back, knees" (06/10/2018)  . Cardiac arrest ([place]) 12/23/2017   Brief V-fib arrest  . Chest pain 09/08/2017  . CHF (congestive heart failure) (HCC)    nonischemic cardiomyopathy, EF 25%  . Gout    "on daily RX" (06/10/2018)  . Headache    "q couple months" (06/10/2018)  . High cholesterol   . History of kidney stones   . Hypertension   . Influenza A 12/24/2017  . NICM (nonischemic cardiomyopathy) ([place])   . Pneumonia 12/20/2017  . TIA (transient ischemic attack) 06/21/2016   "still affects my memory a little bit" (06/10/2018)   Past Surgical History:  Procedure Laterality Date  . EXTERNAL FIXATION LEG Right ~ 2000   "was going bowlegged; had to brake my leg to fix it"  . HERNIA REPAIR    . ICD IMPLANT N/A 12/30/2017   Procedure: ICD IMPLANT;  Surgeon: Deboraha Sprang, MD;  Location: [facility];  Service: Cardiovascular;  Laterality: N/A;  . LEFT HEART CATH AND CORONARY ANGIOGRAPHY N/A 09/09/2017   Procedure: LEFT HEART CATH AND CORONARY ANGIOGRAPHY;  Surgeon: Teodoro Spray, MD;  Location: [place] [facility];  Service: Cardiovascular;  Laterality: N/A;  . UMBILICAL HERNIA REPAIR  1990s  . V TACH ABLATION  06/10/2018  . V TACH ABLATION N/A 06/10/2018   Procedure: V TACH ABLATION;  Surgeon: Thompson Grayer, MD;  Location: [place] [facility];  Service: Cardiovascular;  Laterality: N/A;  . VASECTOMY  Current Meds  Medication Sig  . albuterol (PROVENTIL HFA;VENTOLIN HFA) 108 (90 Base) MCG/ACT inhaler Inhale 2 puffs into the lungs every 4 (four) hours as needed for wheezing or shortness of breath.  . allopurinol (ZYLOPRIM) 100 MG tablet Take 2 tablets (200 mg total) by mouth daily. (Patient taking differently: Take 100 mg by mouth 2 (two) times daily. )  . amiodarone (PACERONE) 200 MG tablet Take 200 mg by mouth 2 (two) times daily.  Marland Kitchen aspirin EC 81 MG EC  tablet Take 1 tablet (81 mg total) by mouth daily.  Marland Kitchen atorvastatin (LIPITOR) 40 MG tablet Take 1 tablet (40 mg total) by mouth daily at 6 PM.  . carvedilol (COREG) 3.125 MG tablet Take 3.125 mg by mouth 2 (two) times daily with a meal.  . clopidogrel (PLAVIX) 75 MG tablet TAKE ONE TABLET BY MOUTH EVERY DAY (Patient taking differently: Take 75 mg by mouth daily. )  . colchicine 0.6 MG tablet Take 1 tablet (0.6 mg total) by mouth daily as needed (gout flare).  . diclofenac sodium (VOLTAREN) 1 % GEL Apply 2 g topically 4 (four) times daily as needed (pain).   . fluticasone (FLONASE) 50 MCG/ACT nasal spray Place 2 sprays into both nostrils daily. (Patient taking differently: Place 2 sprays into both nostrils daily as needed for allergies. )  . fluticasone-salmeterol (ADVAIR HFA) 115-21 MCG/ACT inhaler Inhale 2 puffs into the lungs 2 (two) times daily.  . Magnesium 400 MG TABS Take 1 tablet by mouth 2 (two) times daily.  . methimazole (TAPAZOLE) 10 MG tablet Take 4 tablets (40 mg total) by mouth daily.  . metolazone (ZAROXOLYN) 2.5 MG tablet Take 2.5 mg by mouth as needed.   . nitroGLYCERIN (NITROSTAT) 0.4 MG SL tablet Place 1 tablet (0.4 mg total) under the tongue every 5 (five) minutes as needed for chest pain.  . pantoprazole (PROTONIX) 40 MG tablet Take 1 tablet (40 mg total) by mouth 2 (two) times daily.  . potassium chloride SA (K-DUR,KLOR-CON) 20 MEQ tablet Take 1 tablet (20 mEq total) by mouth 2 (two) times daily. (Patient taking differently: Take 20 mEq by mouth 4 (four) times daily. )  . sennosides-docusate sodium (SENOKOT-S) 8.6-50 MG tablet Take 1 tablet by mouth daily.  Marland Kitchen spironolactone (ALDACTONE) 25 MG tablet Take 0.5 tablets (12.5 mg total) by mouth daily.  . Tiotropium Bromide Monohydrate (SPIRIVA RESPIMAT) 2.5 MCG/ACT AERS Inhale 1 puff into the lungs daily.  Marland Kitchen torsemide (DEMADEX) 20 MG tablet Take 1 tablet (20 mg total) by mouth 2 (two) times daily.     Allergies:   Lisinopril    Social History   Tobacco Use  . Smoking status: Former Smoker    Packs/day: 0.33    Years: 33.00    Pack years: 10.89    Types: Cigarettes    Last attempt to quit: 02/20/2016    Years since quitting: 3.2  . Smokeless tobacco: Never Used  Substance Use Topics  . Alcohol use: Yes    Alcohol/week: 0.0 standard drinks    Comment: 06/10/2018 "beer once/month; if that"  . Drug use: Never     Family Hx: The patient's family history includes CAD in his father; Heart attack in his father; Heart failure in his mother; Hypertension in his father and mother.  ROS:   Please see the history of present illness.     All other systems reviewed and are negative.   Labs/Other Tests and Data Reviewed:    Recent Labs: 02/18/2019: TSH <0.010  05/02/2019: ALT 38 05/03/2019: Magnesium 2.3 05/04/2019: B Natriuretic Peptide 1,720.0 05/15/2019: BUN 36; Creatinine, Ser 1.87; Hemoglobin 14.7; Platelets 254; Potassium 4.9; Sodium 137   Recent Lipid Panel Lab Results  Component Value Date/Time   CHOL 217 (H) 12/03/2018 06:57 PM   CHOL 184 09/19/2018 03:30 PM   TRIG 179 (H) 12/03/2018 06:57 PM   HDL 47 12/03/2018 06:57 PM   HDL 42 09/19/2018 03:30 PM   CHOLHDL 4.6 12/03/2018 06:57 PM   LDLCALC 134 (H) 12/03/2018 06:57 PM   LDLCALC 123 (H) 09/19/2018 03:30 PM    Wt Readings from Last 3 Encounters:  05/21/19 220 lb (99.8 kg)  05/13/19 219 lb (99.3 kg)  05/11/19 214 lb (97.1 kg)     Exam:    Vital Signs:  Wt 220 lb (99.8 kg) Comment: self-reported  BMI 28.63 kg/m    Well nourished, well developed male in no  acute distress.   ASSESSMENT & PLAN:    1. Chronic heart failure with reduced ejection fraction- - NYHA class IIII - euvolemic based on patient's description of symptoms - weighing daily and says that his weight ranges from 217-220 pounds; reminded to call for an overnight weight gain of >2 pounds or a weekly weight gain of >5 pounds - not adding salt to his food and is trying to  closely follow a low sodium diet - has recently been treated for pneumonia - has appointment at [facility] on 06/19/2019 - BNP 05/04/2019 was 1720.0 - continuing to participate in paramedic program  2: HTN- - patient reports that his BP has been good - sees PCP tomorrow - BMP from 05/15/2019 reviewed and showed sodium 137, potassium 4.9, creatinine 1.87 and GFR 46  COVID-19 Education: The signs and symptoms of COVID-19 were discussed with the patient and how to seek care for testing (follow up with PCP or arrange E-visit).  The importance of social distancing was discussed today.  Patient Risk:   After full review of this patients clinical status, I feel that they are at least moderate risk at this time.  Time:   Today, I have spent 10 minutes with the patient with telehealth technology discussing medications, diet and symptoms to report.     Medication Adjustments/Labs and Tests Ordered: Current medicines are reviewed at length with the patient today.  Concerns regarding medicines are outlined above.   Tests Ordered: No orders of the defined types were placed in this encounter.  Medication Changes: No orders of the defined types were placed in this encounter.   Disposition:  Follow-up as needed pending evaluation at [facility] heart failure clinic.   Signed, [name], FNP  05/21/2019 10:08 AM    [facility]

## 2019-05-21 NOTE — Progress Notes (Signed)
BP 110/65   Pulse 86   Wt 220 lb (99.8 kg)   SpO2 94%   BMI 28.63 kg/m    Subjective:    Patient ID: Stephen [name], male    DOB: [birth year], 53 y.o.   MRN: [id number]  HPI: Stephen [name] is a 53 y.o. male  No chief complaint on file.  In the ER with a CHF exacerbation and pneumonia 10 days ago. Saw Apolonio Schneiders for a virtual visit about a week ago. Seeing [name] at the CHF clinic today. Had been doing well with his CHF following his discharge from the ER. Not noticing any worse SOB or swelling. Still coughing up some phlegm. He notes that his pneumonia has been improving- finished his ceftin and doxy antibiotic course.   Kidney function better on recheck on 05/15/19 but still not normal. He is finding this quite concerning. He is going for evaluation for heart transplant in about a month and would like to make sure his kidneys are doing better before that.   BACK PAIN-  Duration: 4 days, better today Mechanism of injury: unknown Location: bilateral and low back Onset: sudden Severity: severe Quality: sharp and aching Frequency: waxing and waning Radiation: L leg above the knee Aggravating factors: moving around Alleviating factors: ice Status: better Treatments attempted: rest and ice  Relief with NSAIDs?: No NSAIDs Taken Nighttime pain:  no Paresthesias / decreased sensation:  no Bowel / bladder incontinence:  no Fevers:  no Dysuria / urinary frequency:  no   Relevant past medical, surgical, family and social history reviewed and updated as indicated. Interim medical history since our last visit reviewed. Allergies and medications reviewed and updated.  Review of Systems  Constitutional: Positive for fatigue. Negative for activity change, appetite change, chills, diaphoresis, fever and unexpected weight change.  HENT: Negative.   Respiratory: Positive for cough. Negative for apnea, choking, chest tightness, shortness of breath, wheezing and stridor.    Cardiovascular: Negative.   Musculoskeletal: Positive for back pain and myalgias. Negative for arthralgias, gait problem, joint swelling, neck pain and neck stiffness.  Skin: Negative.   Psychiatric/Behavioral: Negative.     Per HPI unless specifically indicated above     Objective:    BP 110/65   Pulse 86   Wt 220 lb (99.8 kg)   SpO2 94%   BMI 28.63 kg/m   Wt Readings from Last 3 Encounters:  05/21/19 220 lb (99.8 kg)  05/21/19 220 lb (99.8 kg)  05/21/19 220 lb (99.8 kg)    Physical Exam Vitals signs and nursing note reviewed.  Constitutional:      General: He is not in acute distress.    Appearance: Normal appearance. He is not ill-appearing, toxic-appearing or diaphoretic.  HENT:     Head: Normocephalic and atraumatic.     Right Ear: External ear normal.     Left Ear: External ear normal.     Nose: Nose normal.     Mouth/Throat:     Mouth: Mucous membranes are moist.     Pharynx: Oropharynx is clear.  Eyes:     General: No scleral icterus.       Right eye: No discharge.        Left eye: No discharge.     Conjunctiva/sclera: Conjunctivae normal.     Pupils: Pupils are equal, round, and reactive to light.  Neck:     Musculoskeletal: Normal range of motion.  Pulmonary:     Effort: Pulmonary effort is normal.  No respiratory distress.     Comments: Speaking in full sentences Musculoskeletal: Normal range of motion.  Skin:    Coloration: Skin is not jaundiced or pale.     Findings: No bruising, erythema, lesion or rash.  Neurological:     Mental Status: He is alert and oriented to person, place, and time. Mental status is at baseline.  Psychiatric:        Mood and Affect: Mood normal.        Behavior: Behavior normal.        Thought Content: Thought content normal.        Judgment: Judgment normal.     Results for orders placed or performed in visit on 05/15/19  Microscopic Examination  Result Value Ref Range   WBC, UA 0-5 0 - 5 /hpf   RBC 0-2 0 - 2 /hpf    Epithelial Cells (non renal) 0-10 0 - 10 /hpf   Casts Present None seen /lpf   Cast Type Hyaline casts N/A   Bacteria, UA Few (A) None seen/Few  Basic Metabolic Panel (BMET)  Result Value Ref Range   Glucose 140 (H) 65 - 99 mg/dL   BUN 36 (H) 6 - 24 mg/dL   Creatinine, Ser 1.87 (H) 0.76 - 1.27 mg/dL   GFR calc non Af Amer 40 (L) >59 mL/min/1.73   GFR calc Af Amer 46 (L) >59 mL/min/1.73   BUN/Creatinine Ratio 19 9 - 20   Sodium 137 134 - 144 mmol/L   Potassium 4.9 3.5 - 5.2 mmol/L   Chloride 105 96 - 106 mmol/L   CO2 18 (L) 20 - 29 mmol/L   Calcium 9.5 8.7 - 10.2 mg/dL  UA/M w/rflx Culture, Routine  Result Value Ref Range   Specific Gravity, UA 1.010 1.005 - 1.030   pH, UA 5.0 5.0 - 7.5   Color, UA Yellow Yellow   Appearance Ur Clear Clear   Leukocytes,UA Negative Negative   Protein,UA Negative Negative/Trace   Glucose, UA Negative Negative   Ketones, UA Negative Negative   RBC, UA Trace (A) Negative   Bilirubin, UA Negative Negative   Urobilinogen, Ur 0.2 0.2 - 1.0 mg/dL   Nitrite, UA Negative Negative   Microscopic Examination See below:   CBC with Differential/Platelet  Result Value Ref Range   WBC 8.0 3.4 - 10.8 x10E3/uL   RBC 5.07 4.14 - 5.80 x10E6/uL   Hemoglobin 14.7 13.0 - 17.7 g/dL   Hematocrit 45.5 37.5 - 51.0 %   MCV 90 79 - 97 fL   MCH 29.0 26.6 - 33.0 pg   MCHC 32.3 31.5 - 35.7 g/dL   RDW 15.4 11.6 - 15.4 %   Platelets 254 150 - 450 x10E3/uL   Neutrophils 70 Not Estab. %   Lymphs 20 Not Estab. %   Monocytes 8 Not Estab. %   Eos 1 Not Estab. %   Basos 1 Not Estab. %   Neutrophils Absolute 5.6 1.4 - 7.0 x10E3/uL   Lymphocytes Absolute 1.6 0.7 - 3.1 x10E3/uL   Monocytes Absolute 0.7 0.1 - 0.9 x10E3/uL   EOS (ABSOLUTE) 0.1 0.0 - 0.4 x10E3/uL   Basophils Absolute 0.1 0.0 - 0.2 x10E3/uL   Immature Granulocytes 0 Not Estab. %   Immature Grans (Abs) 0.0 0.0 - 0.1 x10E3/uL      Assessment & Plan:   Problem List Items Addressed This Visit       Genitourinary   CKD (chronic kidney disease) stage 3, GFR 30-59 ml/min (HCC)  Kindeys improved on last check, but still not normal. Continue fluids as able and recheck bmp in 2 weeks. Call with any concerns.        Other Visit Diagnoses    Pneumonia of right lower lobe due to infectious organism [facility])    -  Primary   Still feels like he is improving. On antibiotics. continue current regimen. Follow up 2 weeks. Callw ith any concerns.    Acute bilateral low back pain with left-sided sciatica       Will treat with voltaren and flexeril and stretches. Recheck 2 weeks. Call with any concerns or if not getting better.    Relevant Medications   cyclobenzaprine (FLEXERIL) 10 MG tablet       Follow up plan: Return in about 2 weeks (around 06/04/2019) for follow up kidneys and back pain.    . This visit was completed via D[employer]ity due to the restrictions of the COVID-19 pandemic. All issues as above were discussed and addressed. Physical exam was done as above through visual confirmation on Doximity. If it was felt that the patient should be evaluated in the office, they were directed there. The patient verbally consented to this visit. . Location of the patient: home . Location of the provider: home . Those involved with this call:  . Provider: Park Liter, DO . CMA: Tiffany Reel, CMA . Front Desk/Registration: Don Perking  . Time spent on call: 25 minutes with patient face to face via video conference. More than 50% of this time was spent in counseling and coordination of care. 40 minutes total spent in review of patient's record and preparation of their chart.

## 2019-05-22 ENCOUNTER — Encounter: Payer: Self-pay | Admitting: Family Medicine

## 2019-05-22 NOTE — Assessment & Plan Note (Signed)
Kindeys improved on last check, but still not normal. Continue fluids as able and recheck bmp in 2 weeks. Call with any concerns.

## 2019-05-30 ENCOUNTER — Encounter: Payer: Self-pay | Admitting: Emergency Medicine

## 2019-05-30 ENCOUNTER — Other Ambulatory Visit: Payer: Self-pay

## 2019-05-30 ENCOUNTER — Emergency Department
Admission: EM | Admit: 2019-05-30 | Discharge: 2019-05-30 | Disposition: A | Discharge status: Home / Self Care | Payer: PRIVATE HEALTH INSURANCE | Attending: Emergency Medicine | Admitting: Emergency Medicine

## 2019-05-30 ENCOUNTER — Emergency Department: Payer: PRIVATE HEALTH INSURANCE

## 2019-05-30 DIAGNOSIS — I252 Old myocardial infarction: Secondary | ICD-10-CM | POA: Insufficient documentation

## 2019-05-30 DIAGNOSIS — N183 Chronic kidney disease, stage 3 (moderate): Secondary | ICD-10-CM | POA: Diagnosis not present

## 2019-05-30 DIAGNOSIS — R0602 Shortness of breath: Secondary | ICD-10-CM | POA: Diagnosis not present

## 2019-05-30 DIAGNOSIS — I130 Hypertensive heart and chronic kidney disease with heart failure and stage 1 through stage 4 chronic kidney disease, or unspecified chronic kidney disease: Secondary | ICD-10-CM | POA: Diagnosis not present

## 2019-05-30 DIAGNOSIS — R509 Fever, unspecified: Secondary | ICD-10-CM

## 2019-05-30 DIAGNOSIS — Z7982 Long term (current) use of aspirin: Secondary | ICD-10-CM | POA: Insufficient documentation

## 2019-05-30 DIAGNOSIS — R059 Cough, unspecified: Secondary | ICD-10-CM

## 2019-05-30 DIAGNOSIS — J449 Chronic obstructive pulmonary disease, unspecified: Secondary | ICD-10-CM | POA: Diagnosis not present

## 2019-05-30 DIAGNOSIS — R112 Nausea with vomiting, unspecified: Secondary | ICD-10-CM | POA: Insufficient documentation

## 2019-05-30 DIAGNOSIS — Z7902 Long term (current) use of antithrombotics/antiplatelets: Secondary | ICD-10-CM | POA: Diagnosis not present

## 2019-05-30 DIAGNOSIS — Z20828 Contact with and (suspected) exposure to other viral communicable diseases: Secondary | ICD-10-CM | POA: Insufficient documentation

## 2019-05-30 DIAGNOSIS — Z79899 Other long term (current) drug therapy: Secondary | ICD-10-CM | POA: Diagnosis not present

## 2019-05-30 DIAGNOSIS — Z87891 Personal history of nicotine dependence: Secondary | ICD-10-CM | POA: Insufficient documentation

## 2019-05-30 DIAGNOSIS — R05 Cough: Secondary | ICD-10-CM | POA: Diagnosis not present

## 2019-05-30 DIAGNOSIS — I5022 Chronic systolic (congestive) heart failure: Secondary | ICD-10-CM | POA: Insufficient documentation

## 2019-05-30 DIAGNOSIS — Z95810 Presence of automatic (implantable) cardiac defibrillator: Secondary | ICD-10-CM | POA: Insufficient documentation

## 2019-05-30 DIAGNOSIS — Z8673 Personal history of transient ischemic attack (TIA), and cerebral infarction without residual deficits: Secondary | ICD-10-CM | POA: Insufficient documentation

## 2019-05-30 LAB — CBC
HCT: 40 % (ref 39.0–52.0)
Hemoglobin: 12.9 g/dL — ABNORMAL LOW (ref 13.0–17.0)
MCH: 28.6 pg (ref 26.0–34.0)
MCHC: 32.3 g/dL (ref 30.0–36.0)
MCV: 88.7 fL (ref 80.0–100.0)
Platelets: 302 10*3/uL (ref 150–400)
RBC: 4.51 MIL/uL (ref 4.22–5.81)
RDW: 17.1 % — ABNORMAL HIGH (ref 11.5–15.5)
WBC: 8.1 10*3/uL (ref 4.0–10.5)
nRBC: 0 % (ref 0.0–0.2)

## 2019-05-30 LAB — BASIC METABOLIC PANEL
Anion gap: 11 (ref 5–15)
BUN: 28 mg/dL — ABNORMAL HIGH (ref 6–20)
CO2: 24 mmol/L (ref 22–32)
Calcium: 9.2 mg/dL (ref 8.9–10.3)
Chloride: 104 mmol/L (ref 98–111)
Creatinine, Ser: 1.94 mg/dL — ABNORMAL HIGH (ref 0.61–1.24)
GFR calc Af Amer: 44 mL/min — ABNORMAL LOW (ref >60)
GFR calc non Af Amer: 38 mL/min — ABNORMAL LOW (ref >60)
Glucose, Bld: 105 mg/dL — ABNORMAL HIGH (ref 70–99)
Potassium: 4.2 mmol/L (ref 3.5–5.1)
Sodium: 139 mmol/L (ref 135–145)

## 2019-05-30 LAB — TROPONIN I: Troponin I: 0.04 ng/mL (ref <0.03)

## 2019-05-30 LAB — BRAIN NATRIURETIC PEPTIDE: B Natriuretic Peptide: 2963 pg/mL — ABNORMAL HIGH (ref 0.0–100.0)

## 2019-05-30 LAB — SARS CORONAVIRUS 2 BY RT PCR (HOSPITAL ORDER, PERFORMED IN ~~LOC~~ HOSPITAL LAB): SARS Coronavirus 2: NEGATIVE

## 2019-05-30 MED ORDER — ONDANSETRON 4 MG PO TBDP: 4.0000 mg | ORAL_TABLET | Freq: Three times a day (TID) | ORAL | 0 refills | Status: DC | PRN

## 2019-05-30 MED ORDER — HYDROCOD POLST-CPM POLST ER 10-8 MG/5ML PO SUER: 5.0000 mL | Freq: Every evening | ORAL | 0 refills | Status: DC | PRN

## 2019-05-30 MED ORDER — ONDANSETRON 4 MG PO TBDP
4.0000 mg | ORAL_TABLET | Freq: Once | ORAL | Status: AC
  Administered 2019-05-30: 4 mg via ORAL
  Filled 2019-05-30: qty 1

## 2019-05-30 MED ORDER — FUROSEMIDE 40 MG PO TABS: 40.0000 mg | ORAL_TABLET | Freq: Every day | ORAL | 0 refills | Status: DC

## 2019-05-30 MED ORDER — FUROSEMIDE 40 MG PO TABS
40.0000 mg | ORAL_TABLET | Freq: Once | ORAL | Status: AC
  Administered 2019-05-30: 40 mg via ORAL
  Filled 2019-05-30 (×2): qty 1

## 2019-05-30 MED ORDER — SODIUM CHLORIDE 0.9 % IV BOLUS: 500.0000 mL | Freq: Once | INTRAVENOUS | Status: DC

## 2019-05-30 NOTE — ED Provider Notes (Signed)
-----------------------------------------   4:07 PM on 05/30/2019 -----------------------------------------  Patient seen in conjunction with physician assistant Rachelle Hora.  Overall the patient appears well, has been complaining of continued cough with occasional sputum with occasional blood-streaked sputum.  Patient had a recent diagnosis of pneumonia with a recent [place] admission.  Has completed his course of antibiotics.  Denies any fever.  Overall the patient appears well with reassuring vitals including a 95% room air saturation afebrile.  We will check labs, chest x-ray and continue to closely monitor.  Differential this time would include pneumonia, continued inflammation from recent pneumonia, ACS, COVID.  We will check labs, re-swab for coronavirus and obtain a chest x-ray.  Patient agreeable to plan of care.   Harvest Dark, MD 05/31/19 [id number]

## 2019-05-30 NOTE — ED Notes (Signed)
Date and time results received: 05/30/19 5:06 PM    Test: Troponin Critical Value: 0.04  Name of Provider Notified: Arvella Nigh, [place]

## 2019-05-30 NOTE — ED Provider Notes (Signed)
[facility] EMERGENCY DEPARTMENT Provider Note   CSN: [id number] Arrival date & time: 05/30/19  1403    History   Chief Complaint Chief Complaint  Patient presents with  . Cough  . Shortness of Breath  . Emesis    HPI Stephen [name] is a 53 y.o. male presents to the emergency department for evaluation of cough shortness of breath and vomiting.  Symptoms have been present for 1 day.  States he was diagnosed with pneumonia at [facility] several weeks ago, was given negative.  Patient completed antibiotic course.  Was doing well up until today.  He denies any fevers, chest pain.  His cough is dry nonproductive.  He has had several episodes of vomiting with no diarrhea and no abdominal pain.      HPI  Past Medical History:  Diagnosis Date  . AICD (automatic cardioverter/defibrillator) present   . AKI (acute kidney injury) ([place]) 12/24/2017  . Arrhythmia   . Arthritis    "lower back, knees" (06/10/2018)  . Cardiac arrest ([place]) 12/23/2017   Brief V-fib arrest  . Chest pain 09/08/2017  . CHF (congestive heart failure) (HCC)    nonischemic cardiomyopathy, EF 25%  . Gout    "on daily RX" (06/10/2018)  . Headache    "q couple months" (06/10/2018)  . High cholesterol   . History of kidney stones   . Hypertension   . Influenza A 12/24/2017  . NICM (nonischemic cardiomyopathy) ([place])   . Pneumonia 12/20/2017  . TIA (transient ischemic attack) 06/21/2016   "still affects my memory a little bit" (06/10/2018)    Patient Active Problem List   Diagnosis Date Noted  . Acute on chronic systolic CHF (congestive heart failure) ([place]) 05/02/2019  . Lower GI bleed 04/18/2019  . CHF (congestive heart failure) ([place]) 04/07/2019  . Cough 03/17/2019  . 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 HTN (hypertension) 11/20/2018  . Hypokalemia 08/19/2018  . Syncope 06/30/2018  . CKD (chronic kidney disease) stage 3, GFR 30-59 ml/min (HCC) 05/13/2018  . Hyperlipidemia  05/13/2018  . ED (erectile dysfunction) 05/13/2018  . Osteoarthritis of right knee 04/30/2018  . Gout 03/26/2018  . VF (ventricular fibrillation) ([place])   . Nonischemic cardiomyopathy ([place])   . Cardiogenic shock ([place]) 12/24/2017  . 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 History of non-ST elevation myocardial infarction (NSTEMI) 09/08/2017  . Bradycardia 08/30/2017  . Ventricular tachycardia ([place]) 07/29/2017  . COPD (chronic obstructive pulmonary disease) ([place]) 07/04/2017  . TIA (transient ischemic attack) 06/22/2016  . Benign hypertensive renal disease 06/22/2016  . Chronic systolic congestive heart failure ([place]) 01/20/2016  . Cardiomyopathy ([place]) 01/20/2016    Past Surgical History:  Procedure Laterality Date  . EXTERNAL FIXATION LEG Right ~ 2000   "was going bowlegged; had to brake my leg to fix it"  . HERNIA REPAIR    . ICD IMPLANT N/A 12/30/2017   Procedure: ICD IMPLANT;  Surgeon: Deboraha Sprang, MD;  Location: [facility];  Service: Cardiovascular;  Laterality: N/A;  . LEFT HEART CATH AND CORONARY ANGIOGRAPHY N/A 09/09/2017   Procedure: LEFT HEART CATH AND CORONARY ANGIOGRAPHY;  Surgeon: Teodoro Spray, MD;  Location: [facility];  Service: Cardiovascular;  Laterality: N/A;  . UMBILICAL HERNIA REPAIR  1990s  . V TACH ABLATION  06/10/2018  . V TACH ABLATION N/A 06/10/2018   Procedure: V TACH ABLATION;  Surgeon: Thompson Grayer, MD;  Location: [facility];  Service: Cardiovascular;  Laterality: N/A;  .  VASECTOMY          Home Medications    Prior to Admission medications   Medication Sig Start Date End Date Taking? Authorizing Provider  acetaminophen (TYLENOL) 325 MG tablet Take 2 tablets (650 mg total) by mouth every 6 (six) hours as needed. Patient taking differently: Take 325-650 mg by mouth every 6 (six) hours as needed for moderate pain.  03/19/18   Carrie Mew, MD  albuterol (PROVENTIL HFA;VENTOLIN HFA) 108 (90 Base) MCG/ACT inhaler Inhale 2 puffs  into the lungs every 4 (four) hours as needed for wheezing or shortness of breath. 09/19/18   Johnson, Megan P, DO  allopurinol (ZYLOPRIM) 100 MG tablet Take 2 tablets (200 mg total) by mouth daily. Patient taking differently: Take 100 mg by mouth 2 (two) times daily.  10/19/18   Johnson, Megan P, DO  amiodarone (PACERONE) 200 MG tablet Take 200 mg by mouth 2 (two) times daily.    [provider]  aspirin EC 81 MG EC tablet Take 1 tablet (81 mg total) by mouth daily. 01/01/18   Daune Perch, NP  atorvastatin (LIPITOR) 40 MG tablet Take 1 tablet (40 mg total) by mouth daily at 6 PM. 04/22/19   [name], FNP  carvedilol (COREG) 3.125 MG tablet Take 3.125 mg by mouth 2 (two) times daily with a meal.    [provider]  chlorpheniramine-HYDROcodone (TUSSIONEX PENNKINETIC ER) 10-8 MG/5ML SUER Take 5 mLs by mouth at bedtime as needed for cough. 05/30/19   Duanne Guess, PA-C  clopidogrel (PLAVIX) 75 MG tablet TAKE ONE TABLET BY MOUTH EVERY DAY Patient taking differently: Take 75 mg by mouth daily.  02/13/19   Bensimhon, Shaune Pascal, MD  colchicine 0.6 MG tablet Take 1 tablet (0.6 mg total) by mouth daily as needed (gout flare). 09/03/18   Johnson, Megan P, DO  cyclobenzaprine (FLEXERIL) 10 MG tablet Take 1 tablet (10 mg total) by mouth at bedtime. 05/21/19   Park Liter P, DO  diclofenac sodium (VOLTAREN) 1 % GEL Apply 2 g topically 4 (four) times daily as needed (pain).     [provider]  fluticasone (FLONASE) 50 MCG/ACT nasal spray Place 2 sprays into both nostrils daily. Patient taking differently: Place 2 sprays into both nostrils daily as needed for allergies.  06/20/17   [name], MD  fluticasone-salmeterol (ADVAIR HFA) [phone] MCG/ACT inhaler Inhale 2 puffs into the lungs 2 (two) times daily. 08/12/18   [name], MD  furosemide (LASIX) 40 MG tablet Take 1 tablet (40 mg total) by mouth daily for 5 days. 05/30/19 06/04/19  Duanne Guess, PA-C  Magnesium 400 MG  TABS Take 1 tablet by mouth 2 (two) times daily. 04/22/19   [name], FNP  methimazole (TAPAZOLE) 10 MG tablet Take 4 tablets (40 mg total) by mouth daily. 12/09/18   Mayo, Pete Pelt, MD  metolazone (ZAROXOLYN) 2.5 MG tablet Take 2.5 mg by mouth as needed.  09/05/18   [provider]  Multiple Vitamin (MULTIVITAMIN WITH MINERALS) TABS tablet Take 1 tablet by mouth daily.    [provider]  nitroGLYCERIN (NITROSTAT) 0.4 MG SL tablet Place 1 tablet (0.4 mg total) under the tongue every 5 (five) minutes as needed for chest pain. 04/22/19   [name], FNP  ondansetron (ZOFRAN ODT) 4 MG disintegrating tablet Take 1 tablet (4 mg total) by mouth every 8 (eight) hours as needed for nausea or vomiting. 05/30/19   Duanne Guess, PA-C  pantoprazole (  PROTONIX) 40 MG tablet Take 1 tablet (40 mg total) by mouth 2 (two) times daily. 03/01/19   Fritzi Mandes, MD  polyethylene glycol (MIRALAX / GLYCOLAX) 17 g packet Take 17 g by mouth daily. 04/20/19   Bettey Costa, MD  potassium chloride SA (K-DUR,KLOR-CON) 20 MEQ tablet Take 1 tablet (20 mEq total) by mouth 2 (two) times daily. Patient taking differently: Take 20 mEq by mouth 4 (four) times daily.  03/01/19   Fritzi Mandes, MD  promethazine (PHENERGAN) 12.5 MG tablet Take 1 tablet (12.5 mg total) by mouth every 6 (six) hours as needed for nausea or vomiting. Patient not taking: Reported on 05/21/2019 11/24/18   Nance Pear, MD  sennosides-docusate sodium (SENOKOT-S) 8.6-50 MG tablet Take 1 tablet by mouth daily.    [provider]  sodium chloride (OCEAN) 0.65 % SOLN nasal spray Place 1 spray into both nostrils as needed for congestion.    [provider]  spironolactone (ALDACTONE) 25 MG tablet Take 0.5 tablets (12.5 mg total) by mouth daily. 04/15/19   Vaughan Basta, MD  Tiotropium Bromide Monohydrate (SPIRIVA RESPIMAT) 2.5 MCG/ACT AERS Inhale 1 puff into the lungs daily. 08/12/18   [name], MD  torsemide  (DEMADEX) 20 MG tablet Take 1 tablet (20 mg total) by mouth 2 (two) times daily. 04/15/19   Vaughan Basta, MD    Family History Family History  Problem Relation Age of Onset  . Hypertension Mother   . Heart failure Mother   . Hypertension Father   . CAD Father   . Heart attack Father     Social History Social History   Tobacco Use  . Smoking status: Former Smoker    Packs/day: 0.33    Years: 33.00    Pack years: 10.89    Types: Cigarettes    Last attempt to quit: 02/20/2016    Years since quitting: 3.2  . Smokeless tobacco: Never Used  Substance Use Topics  . Alcohol use: Yes    Alcohol/week: 0.0 standard drinks    Comment: 06/10/2018 "beer once/month; if that"  . Drug use: Never     Allergies   Lisinopril   Review of Systems Review of Systems  Constitutional: Negative for chills, fatigue and fever.  HENT: Negative for mouth sores, sinus pain and sore throat.   Respiratory: Positive for cough and shortness of breath. Negative for wheezing and stridor.   Cardiovascular: Negative for chest pain.  Gastrointestinal: Positive for nausea and vomiting. Negative for abdominal pain and diarrhea.  Musculoskeletal: Negative for back pain and myalgias.  Skin: Negative for rash.  Neurological: Negative for dizziness, seizures, syncope, numbness and headaches.     Physical Exam Updated Vital Signs BP 97/69 (BP Location: Left Arm)   Pulse 66   Temp 98.4 F (36.9 C) (Oral)   Resp 20   Ht 6\' 1"  (1.854 m)   Wt 98.9 kg   SpO2 97%   BMI 28.76 kg/m   Physical Exam Constitutional:      Appearance: He is well-developed.  HENT:     Head: Normocephalic and atraumatic.     Right Ear: External ear normal.     Left Ear: External ear normal.     Mouth/Throat:     Pharynx: No oropharyngeal exudate or posterior oropharyngeal erythema.  Eyes:     Conjunctiva/sclera: Conjunctivae normal.  Neck:     Musculoskeletal: Normal range of motion.  Cardiovascular:     Rate  and Rhythm: Normal rate.     Pulses:  Normal pulses.  Pulmonary:     Effort: Pulmonary effort is normal. No respiratory distress.     Breath sounds: Normal breath sounds. No stridor. No wheezing or rhonchi.  Chest:     Chest wall: No tenderness.  Abdominal:     General: There is no distension.     Tenderness: There is no abdominal tenderness. There is no guarding.  Musculoskeletal: Normal range of motion.  Skin:    General: Skin is warm.     Findings: No rash.  Neurological:     Mental Status: He is alert and oriented to person, place, and time.  Psychiatric:        Behavior: Behavior normal.        Thought Content: Thought content normal.      ED Treatments / Results  Labs (all labs ordered are listed, but only abnormal results are displayed) Labs Reviewed  CBC - Abnormal; Notable for the following components:      Result Value   Hemoglobin 12.9 (*)    RDW 17.1 (*)    All other components within normal limits  BASIC METABOLIC PANEL - Abnormal; Notable for the following components:   Glucose, Bld 105 (*)    BUN 28 (*)    Creatinine, Ser 1.94 (*)    GFR calc non Af Amer 38 (*)    GFR calc Af Amer 44 (*)    All other components within normal limits  BRAIN NATRIURETIC PEPTIDE - Abnormal; Notable for the following components:   B Natriuretic Peptide 2,963.0 (*)    All other components within normal limits  TROPONIN I - Abnormal; Notable for the following components:   Troponin I 0.04 (*)    All other components within normal limits  SARS CORONAVIRUS 2 (HOSPITAL ORDER, [place] [facility])  TROPONIN I    EKG None  Radiology Dg Chest Port 1 View  Result Date: 05/30/2019 CLINICAL DATA:  Cough, shortness of breath, nausea and vomiting beginning this morning. EXAM: PORTABLE CHEST 1 VIEW COMPARISON:  PA and lateral chest 05/04/2019 and 02/13/2019. Single-view of the chest 05/02/2019. FINDINGS: There is marked cardiomegaly and mild interstitial edema. No  consolidative process, pneumothorax or effusion. AICD is noted. No acute or focal bony abnormality. IMPRESSION: Cardiomegaly and mild interstitial edema appear unchanged compared to the most recent study. Electronically Signed   By: Inge Rise M.D.   On: 05/30/2019 16:29    Procedures Procedures (including critical care time)  Medications Ordered in ED Medications  furosemide (LASIX) tablet 40 mg (has no administration in time range)  ondansetron (ZOFRAN-ODT) disintegrating tablet 4 mg (4 mg Oral Given 05/30/19 1548)     Initial Impression / Assessment and Plan / ED Course  I have reviewed the triage vital signs and the nursing notes.  Pertinent labs & imaging results that were available during my care of the patient were reviewed by me and considered in my medical decision making (see chart for details).        53 year old male with history of CHF presents for valuation of cough.  Had previous diagnosis of pneumonia several weeks ago was treated with doxycycline.  Patient states he had a cough that began this morning along with some shortness of breath and a few episodes of vomiting.  COVID test negative.  Chest x-ray showed mild interstitial edema unchanged from previous x-rays.  BNP at baseline.  No acute changes of EKG.  Patient stable and ready for discharge.  Will give  Lasix 40 mg daily for 5 days.  He is given cough medication as well as Zofran for nausea.  Patient will follow-up with PCP.  Final Clinical Impressions(s) / ED Diagnoses   Final diagnoses:  Cough  Shortness of breath  Non-intractable vomiting with nausea, unspecified vomiting type    ED Discharge Orders         Ordered    ondansetron (ZOFRAN ODT) 4 MG disintegrating tablet  Every 8 hours PRN     05/30/19 1848    furosemide (LASIX) 40 MG tablet  Daily     05/30/19 1848    chlorpheniramine-HYDROcodone (TUSSIONEX PENNKINETIC ER) 10-8 MG/5ML SUER  At bedtime PRN     05/30/19 1848           Duanne Guess, PA-C 05/30/19 1858    Harvest Dark, MD 05/30/19 2259

## 2019-05-30 NOTE — ED Triage Notes (Signed)
Cough, shortness of breath, nausea with emesis began this am.

## 2019-05-30 NOTE — Discharge Instructions (Signed)
Please take Lasix as prescribed.  Use cough medication as prescribed.  Return to the ER for any increasing shortness of breath, fevers, worsening symptoms or urgent changes in health.

## 2019-06-01 ENCOUNTER — Telehealth (HOSPITAL_COMMUNITY): Payer: Self-pay

## 2019-06-01 NOTE — Telephone Encounter (Signed)
Spoke with Stephen [name] today since he had an ED visit this past weekend.  He states feeling better, he states he was probably sick due to family drama.  Did notice meds that was prescribed, he states taking the lasix but not the tussinex, insurance will not pay.  He is taking creomulsion instead, states it helps at night.  Asked [name] if he needs blood work after the lasix, she advised should be ok, he has appt at [facility] on 19th of this month.  He did states has all his medications and still taking them all including torsemide.  He denies chest pain, states cough is much better today, mostly when lays down at night.  He states been watching fluids and sodium.  He states weight is same 218 lbs.  Denies feeling tight in chest today or he does not have any swelling in lower extremities.  Will continue to visit for heart failure.   [place] [phone]

## 2019-06-03 ENCOUNTER — Ambulatory Visit: Payer: PRIVATE HEALTH INSURANCE | Admitting: Family

## 2019-06-12 ENCOUNTER — Other Ambulatory Visit: Payer: Self-pay

## 2019-06-12 ENCOUNTER — Emergency Department: Payer: PRIVATE HEALTH INSURANCE

## 2019-06-12 ENCOUNTER — Inpatient Hospital Stay
Admission: EM | Admit: 2019-06-12 | Discharge: 2019-06-26 | DRG: 291 | Disposition: A | Discharge status: Home / Self Care | Payer: PRIVATE HEALTH INSURANCE | Attending: Internal Medicine | Admitting: Internal Medicine

## 2019-06-12 ENCOUNTER — Encounter: Payer: Self-pay | Admitting: Emergency Medicine

## 2019-06-12 DIAGNOSIS — E872 Acidosis: Secondary | ICD-10-CM | POA: Diagnosis present

## 2019-06-12 DIAGNOSIS — E785 Hyperlipidemia, unspecified: Secondary | ICD-10-CM | POA: Diagnosis present

## 2019-06-12 DIAGNOSIS — E876 Hypokalemia: Secondary | ICD-10-CM | POA: Diagnosis not present

## 2019-06-12 DIAGNOSIS — N179 Acute kidney failure, unspecified: Secondary | ICD-10-CM | POA: Diagnosis not present

## 2019-06-12 DIAGNOSIS — Z789 Other specified health status: Secondary | ICD-10-CM

## 2019-06-12 DIAGNOSIS — K7460 Unspecified cirrhosis of liver: Secondary | ICD-10-CM | POA: Diagnosis present

## 2019-06-12 DIAGNOSIS — Z79899 Other long term (current) drug therapy: Secondary | ICD-10-CM | POA: Diagnosis not present

## 2019-06-12 DIAGNOSIS — I509 Heart failure, unspecified: Secondary | ICD-10-CM

## 2019-06-12 DIAGNOSIS — R0602 Shortness of breath: Secondary | ICD-10-CM | POA: Diagnosis present

## 2019-06-12 DIAGNOSIS — K5909 Other constipation: Secondary | ICD-10-CM | POA: Diagnosis present

## 2019-06-12 DIAGNOSIS — I129 Hypertensive chronic kidney disease with stage 1 through stage 4 chronic kidney disease, or unspecified chronic kidney disease: Secondary | ICD-10-CM | POA: Diagnosis not present

## 2019-06-12 DIAGNOSIS — M109 Gout, unspecified: Secondary | ICD-10-CM | POA: Diagnosis present

## 2019-06-12 DIAGNOSIS — Z87891 Personal history of nicotine dependence: Secondary | ICD-10-CM

## 2019-06-12 DIAGNOSIS — I493 Ventricular premature depolarization: Secondary | ICD-10-CM | POA: Diagnosis not present

## 2019-06-12 DIAGNOSIS — J449 Chronic obstructive pulmonary disease, unspecified: Secondary | ICD-10-CM | POA: Diagnosis present

## 2019-06-12 DIAGNOSIS — Z7982 Long term (current) use of aspirin: Secondary | ICD-10-CM

## 2019-06-12 DIAGNOSIS — Z888 Allergy status to other drugs, medicaments and biological substances status: Secondary | ICD-10-CM

## 2019-06-12 DIAGNOSIS — T502X5A Adverse effect of carbonic-anhydrase inhibitors, benzothiadiazides and other diuretics, initial encounter: Secondary | ICD-10-CM | POA: Diagnosis not present

## 2019-06-12 DIAGNOSIS — I130 Hypertensive heart and chronic kidney disease with heart failure and stage 1 through stage 4 chronic kidney disease, or unspecified chronic kidney disease: Principal | ICD-10-CM | POA: Diagnosis present

## 2019-06-12 DIAGNOSIS — Z8249 Family history of ischemic heart disease and other diseases of the circulatory system: Secondary | ICD-10-CM

## 2019-06-12 DIAGNOSIS — R112 Nausea with vomiting, unspecified: Secondary | ICD-10-CM

## 2019-06-12 DIAGNOSIS — I428 Other cardiomyopathies: Secondary | ICD-10-CM | POA: Diagnosis present

## 2019-06-12 DIAGNOSIS — Z9115 Patient's noncompliance with renal dialysis: Secondary | ICD-10-CM

## 2019-06-12 DIAGNOSIS — I158 Other secondary hypertension: Secondary | ICD-10-CM | POA: Diagnosis present

## 2019-06-12 DIAGNOSIS — Z20828 Contact with and (suspected) exposure to other viral communicable diseases: Secondary | ICD-10-CM | POA: Diagnosis present

## 2019-06-12 DIAGNOSIS — Z8673 Personal history of transient ischemic attack (TIA), and cerebral infarction without residual deficits: Secondary | ICD-10-CM

## 2019-06-12 DIAGNOSIS — I10 Essential (primary) hypertension: Secondary | ICD-10-CM | POA: Diagnosis present

## 2019-06-12 DIAGNOSIS — E875 Hyperkalemia: Secondary | ICD-10-CM | POA: Diagnosis present

## 2019-06-12 DIAGNOSIS — J810 Acute pulmonary edema: Secondary | ICD-10-CM

## 2019-06-12 DIAGNOSIS — D631 Anemia in chronic kidney disease: Secondary | ICD-10-CM | POA: Diagnosis present

## 2019-06-12 DIAGNOSIS — E871 Hypo-osmolality and hyponatremia: Secondary | ICD-10-CM | POA: Diagnosis present

## 2019-06-12 DIAGNOSIS — Z7902 Long term (current) use of antithrombotics/antiplatelets: Secondary | ICD-10-CM

## 2019-06-12 DIAGNOSIS — I248 Other forms of acute ischemic heart disease: Secondary | ICD-10-CM | POA: Diagnosis present

## 2019-06-12 DIAGNOSIS — N183 Chronic kidney disease, stage 3 unspecified: Secondary | ICD-10-CM | POA: Diagnosis present

## 2019-06-12 DIAGNOSIS — I5023 Acute on chronic systolic (congestive) heart failure: Secondary | ICD-10-CM | POA: Diagnosis present

## 2019-06-12 DIAGNOSIS — I959 Hypotension, unspecified: Secondary | ICD-10-CM | POA: Diagnosis not present

## 2019-06-12 DIAGNOSIS — Z95810 Presence of automatic (implantable) cardiac defibrillator: Secondary | ICD-10-CM | POA: Diagnosis not present

## 2019-06-12 DIAGNOSIS — I472 Ventricular tachycardia: Secondary | ICD-10-CM | POA: Diagnosis not present

## 2019-06-12 DIAGNOSIS — Z8674 Personal history of sudden cardiac arrest: Secondary | ICD-10-CM

## 2019-06-12 DIAGNOSIS — N189 Chronic kidney disease, unspecified: Secondary | ICD-10-CM | POA: Diagnosis not present

## 2019-06-12 LAB — CBC
HCT: 44.2 % (ref 39.0–52.0)
Hemoglobin: 14.3 g/dL (ref 13.0–17.0)
MCH: 28.5 pg (ref 26.0–34.0)
MCHC: 32.4 g/dL (ref 30.0–36.0)
MCV: 88 fL (ref 80.0–100.0)
Platelets: 384 10*3/uL (ref 150–400)
RBC: 5.02 MIL/uL (ref 4.22–5.81)
RDW: 17.1 % — ABNORMAL HIGH (ref 11.5–15.5)
WBC: 8.1 10*3/uL (ref 4.0–10.5)
nRBC: 0 % (ref 0.0–0.2)

## 2019-06-12 LAB — BASIC METABOLIC PANEL
Anion gap: 12 (ref 5–15)
BUN: 27 mg/dL — ABNORMAL HIGH (ref 6–20)
CO2: 19 mmol/L — ABNORMAL LOW (ref 22–32)
Calcium: 9.4 mg/dL (ref 8.9–10.3)
Chloride: 103 mmol/L (ref 98–111)
Creatinine, Ser: 1.91 mg/dL — ABNORMAL HIGH (ref 0.61–1.24)
GFR calc Af Amer: 45 mL/min — ABNORMAL LOW (ref >60)
GFR calc non Af Amer: 39 mL/min — ABNORMAL LOW (ref >60)
Glucose, Bld: 172 mg/dL — ABNORMAL HIGH (ref 70–99)
Potassium: 4.8 mmol/L (ref 3.5–5.1)
Sodium: 134 mmol/L — ABNORMAL LOW (ref 135–145)

## 2019-06-12 LAB — HEPATIC FUNCTION PANEL
ALT: 14 U/L (ref 0–44)
AST: 14 U/L — ABNORMAL LOW (ref 15–41)
Albumin: 3.5 g/dL (ref 3.5–5.0)
Alkaline Phosphatase: 104 U/L (ref 38–126)
Bilirubin, Direct: 0.2 mg/dL (ref 0.0–0.2)
Indirect Bilirubin: 0.5 mg/dL (ref 0.3–0.9)
Total Bilirubin: 0.7 mg/dL (ref 0.3–1.2)
Total Protein: 7.5 g/dL (ref 6.5–8.1)

## 2019-06-12 LAB — TROPONIN I: Troponin I: 0.07 ng/mL (ref <0.03)

## 2019-06-12 LAB — SARS CORONAVIRUS 2 BY RT PCR (HOSPITAL ORDER, PERFORMED IN ~~LOC~~ HOSPITAL LAB): SARS Coronavirus 2: NEGATIVE

## 2019-06-12 LAB — PROTIME-INR
INR: 1.1 (ref 0.8–1.2)
Prothrombin Time: 13.9 seconds (ref 11.4–15.2)

## 2019-06-12 LAB — LIPASE, BLOOD: Lipase: 20 U/L (ref 11–51)

## 2019-06-12 MED ORDER — ENOXAPARIN SODIUM 40 MG/0.4ML ~~LOC~~ SOLN
40.0000 mg | SUBCUTANEOUS | Status: DC
  Administered 2019-06-13: 40 mg via SUBCUTANEOUS
  Filled 2019-06-12: qty 0.4

## 2019-06-12 MED ORDER — TIOTROPIUM BROMIDE MONOHYDRATE 18 MCG IN CAPS
1.0000 | ORAL_CAPSULE | Freq: Every day | RESPIRATORY_TRACT | Status: DC
  Administered 2019-06-13 – 2019-06-26 (×14): 18 ug via RESPIRATORY_TRACT
  Filled 2019-06-12 (×3): qty 5

## 2019-06-12 MED ORDER — FUROSEMIDE 10 MG/ML IJ SOLN
40.0000 mg | Freq: Once | INTRAMUSCULAR | Status: AC
  Administered 2019-06-12: 40 mg via INTRAVENOUS
  Filled 2019-06-12: qty 4

## 2019-06-12 MED ORDER — FUROSEMIDE 10 MG/ML IJ SOLN
20.0000 mg | Freq: Two times a day (BID) | INTRAMUSCULAR | Status: DC
  Administered 2019-06-13 – 2019-06-15 (×4): 20 mg via INTRAVENOUS
  Filled 2019-06-12 (×5): qty 2

## 2019-06-12 MED ORDER — ONDANSETRON HCL 4 MG PO TABS
4.0000 mg | ORAL_TABLET | Freq: Four times a day (QID) | ORAL | Status: DC | PRN
  Administered 2019-06-26: 4 mg via ORAL
  Filled 2019-06-12: qty 1

## 2019-06-12 MED ORDER — ACETAMINOPHEN 650 MG RE SUPP: 650.0000 mg | Freq: Four times a day (QID) | RECTAL | Status: DC | PRN

## 2019-06-12 MED ORDER — CARVEDILOL 3.125 MG PO TABS
3.1250 mg | ORAL_TABLET | Freq: Two times a day (BID) | ORAL | Status: DC
  Administered 2019-06-13 – 2019-06-17 (×8): 3.125 mg via ORAL
  Filled 2019-06-12 (×8): qty 1

## 2019-06-12 MED ORDER — PANTOPRAZOLE SODIUM 40 MG PO TBEC
40.0000 mg | DELAYED_RELEASE_TABLET | Freq: Two times a day (BID) | ORAL | Status: DC
  Administered 2019-06-13 – 2019-06-15 (×6): 40 mg via ORAL
  Filled 2019-06-12 (×8): qty 1

## 2019-06-12 MED ORDER — ACETAMINOPHEN 325 MG PO TABS: 650.0000 mg | ORAL_TABLET | Freq: Four times a day (QID) | ORAL | Status: DC | PRN

## 2019-06-12 MED ORDER — SODIUM CHLORIDE 0.9% FLUSH: 3.0000 mL | Freq: Once | INTRAVENOUS | Status: DC

## 2019-06-12 MED ORDER — ASPIRIN EC 81 MG PO TBEC
81.0000 mg | DELAYED_RELEASE_TABLET | Freq: Every day | ORAL | Status: DC
  Administered 2019-06-13 – 2019-06-23 (×11): 81 mg via ORAL
  Filled 2019-06-12 (×12): qty 1

## 2019-06-12 MED ORDER — MOMETASONE FURO-FORMOTEROL FUM 200-5 MCG/ACT IN AERO
2.0000 | INHALATION_SPRAY | Freq: Two times a day (BID) | RESPIRATORY_TRACT | Status: DC
  Administered 2019-06-13 – 2019-06-26 (×27): 2 via RESPIRATORY_TRACT
  Filled 2019-06-12: qty 8.8

## 2019-06-12 MED ORDER — METHIMAZOLE 10 MG PO TABS
40.0000 mg | ORAL_TABLET | Freq: Every day | ORAL | Status: DC
  Administered 2019-06-13 – 2019-06-26 (×10): 40 mg via ORAL
  Filled 2019-06-12 (×14): qty 4

## 2019-06-12 MED ORDER — CLOPIDOGREL BISULFATE 75 MG PO TABS
75.0000 mg | ORAL_TABLET | Freq: Every day | ORAL | Status: DC
  Administered 2019-06-13 – 2019-06-26 (×14): 75 mg via ORAL
  Filled 2019-06-12 (×14): qty 1

## 2019-06-12 MED ORDER — SPIRONOLACTONE 25 MG PO TABS
12.5000 mg | ORAL_TABLET | Freq: Every day | ORAL | Status: DC
  Administered 2019-06-13 – 2019-06-15 (×3): 12.5 mg via ORAL
  Filled 2019-06-12: qty 1
  Filled 2019-06-12: qty 0.5
  Filled 2019-06-12: qty 1
  Filled 2019-06-12 (×2): qty 0.5
  Filled 2019-06-12: qty 1

## 2019-06-12 MED ORDER — ONDANSETRON HCL 4 MG/2ML IJ SOLN
4.0000 mg | Freq: Four times a day (QID) | INTRAMUSCULAR | Status: DC | PRN
  Administered 2019-06-14 – 2019-06-16 (×4): 4 mg via INTRAVENOUS
  Filled 2019-06-12 (×4): qty 2

## 2019-06-12 MED ORDER — ATORVASTATIN CALCIUM 20 MG PO TABS
40.0000 mg | ORAL_TABLET | Freq: Every day | ORAL | Status: DC
  Administered 2019-06-13 – 2019-06-25 (×13): 40 mg via ORAL
  Filled 2019-06-12 (×13): qty 2

## 2019-06-12 MED ORDER — AMIODARONE HCL 200 MG PO TABS
200.0000 mg | ORAL_TABLET | Freq: Two times a day (BID) | ORAL | Status: DC
  Administered 2019-06-13 – 2019-06-26 (×27): 200 mg via ORAL
  Filled 2019-06-12 (×27): qty 1

## 2019-06-12 NOTE — ED Notes (Signed)
Urinal provided.

## 2019-06-12 NOTE — ED Notes (Signed)
Transport to floor room 241.AS

## 2019-06-12 NOTE — ED Notes (Signed)
ED Provider at bedside. 

## 2019-06-12 NOTE — ED Provider Notes (Signed)
[facility] Emergency Department Provider Note  ____________________________________________  Time seen: Approximately 9:58 PM  I have reviewed the triage vital signs and the nursing notes.   HISTORY  Chief Complaint Shortness of Breath and Chest Pain    HPI Stephen [name] is a 53 y.o. male with a history of heart failure, AKI, ICD implantation, hypertension who comes the ED complaining of  central chest pain and shortness of breath.  He has been having the symptoms over the past few weeks, gradually worsening despite multiple visits to emergency department and heart failure clinic and periods of increased diuretic use.  He reports that his weight has increased by 8 pounds.  Chest pain is aching, radiating to left arm, no aggravating or alleviating factors.  He endorses dyspnea on exertion and orthopnea.  Feels more comfortable sitting upright.     Past Medical History:  Diagnosis Date  . AICD (automatic cardioverter/defibrillator) present   . AKI (acute kidney injury) ([place]) 12/24/2017  . Arrhythmia   . Arthritis    "lower back, knees" (06/10/2018)  . Cardiac arrest ([place]) 12/23/2017   Brief V-fib arrest  . Chest pain 09/08/2017  . CHF (congestive heart failure) (HCC)    nonischemic cardiomyopathy, EF 25%  . Gout    "on daily RX" (06/10/2018)  . Headache    "q couple months" (06/10/2018)  . High cholesterol   . History of kidney stones   . Hypertension   . Influenza A 12/24/2017  . NICM (nonischemic cardiomyopathy) ([place])   . Pneumonia 12/20/2017  . TIA (transient ischemic attack) 06/21/2016   "still affects my memory a little bit" (06/10/2018)     Patient Active Problem List   Diagnosis Date Noted  . Acute on chronic systolic CHF (congestive heart failure) ([place]) 05/02/2019  . Lower GI bleed 04/18/2019  . CHF (congestive heart failure) ([place]) 04/07/2019  . Cough 03/17/2019  . 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 HTN (hypertension)  11/20/2018  . Hypokalemia 08/19/2018  . Syncope 06/30/2018  . CKD (chronic kidney disease) stage 3, GFR 30-59 ml/min (HCC) 05/13/2018  . Hyperlipidemia 05/13/2018  . ED (erectile dysfunction) 05/13/2018  . Osteoarthritis of right knee 04/30/2018  . Gout 03/26/2018  . VF (ventricular fibrillation) ([place])   . Nonischemic cardiomyopathy ([place])   . Cardiogenic shock ([place]) 12/24/2017  . 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 History of non-ST elevation myocardial infarction (NSTEMI) 09/08/2017  . Bradycardia 08/30/2017  . Ventricular tachycardia ([place]) 07/29/2017  . COPD (chronic obstructive pulmonary disease) ([place]) 07/04/2017  . TIA (transient ischemic attack) 06/22/2016  . Benign hypertensive renal disease 06/22/2016  . Chronic systolic congestive heart failure ([place]) 01/20/2016  . Cardiomyopathy ([place]) 01/20/2016     Past Surgical History:  Procedure Laterality Date  . EXTERNAL FIXATION LEG Right ~ 2000   "was going bowlegged; had to brake my leg to fix it"  . HERNIA REPAIR    . ICD IMPLANT N/A 12/30/2017   Procedure: ICD IMPLANT;  Surgeon: Deboraha Sprang, MD;  Location: [place] [facility];  Service: Cardiovascular;  Laterality: N/A;  . LEFT HEART CATH AND CORONARY ANGIOGRAPHY N/A 09/09/2017   Procedure: LEFT HEART CATH AND CORONARY ANGIOGRAPHY;  Surgeon: Teodoro Spray, MD;  Location: [place] [facility];  Service: Cardiovascular;  Laterality: N/A;  . UMBILICAL HERNIA REPAIR  1990s  . V TACH ABLATION  06/10/2018  . V TACH ABLATION N/A 06/10/2018   Procedure: V TACH ABLATION;  Surgeon: Thompson Grayer, MD;  Location:  [place] [facility];  Service: Cardiovascular;  Laterality: N/A;  . VASECTOMY       Prior to Admission medications   Medication Sig Start Date End Date Taking? Authorizing Provider  acetaminophen (TYLENOL) 325 MG tablet Take 2 tablets (650 mg total) by mouth every 6 (six) hours as needed. Patient taking differently: Take 325-650 mg by mouth every 6 (six) hours as needed for  moderate pain.  03/19/18   Carrie Mew, MD  albuterol (PROVENTIL HFA;VENTOLIN HFA) 108 (90 Base) MCG/ACT inhaler Inhale 2 puffs into the lungs every 4 (four) hours as needed for wheezing or shortness of breath. 09/19/18   Johnson, Megan P, DO  allopurinol (ZYLOPRIM) 100 MG tablet Take 2 tablets (200 mg total) by mouth daily. Patient taking differently: Take 100 mg by mouth 2 (two) times daily.  10/19/18   Johnson, Megan P, DO  amiodarone (PACERONE) 200 MG tablet Take 200 mg by mouth 2 (two) times daily.    [provider]  aspirin EC 81 MG EC tablet Take 1 tablet (81 mg total) by mouth daily. 01/01/18   Daune Perch, NP  atorvastatin (LIPITOR) 40 MG tablet Take 1 tablet (40 mg total) by mouth daily at 6 PM. 04/22/19   [name], FNP  carvedilol (COREG) 3.125 MG tablet Take 3.125 mg by mouth 2 (two) times daily with a meal.    [provider]  chlorpheniramine-HYDROcodone (TUSSIONEX PENNKINETIC ER) 10-8 MG/5ML SUER Take 5 mLs by mouth at bedtime as needed for cough. Patient not taking: Reported on 06/01/2019 05/30/19   Duanne Guess, PA-C  clopidogrel (PLAVIX) 75 MG tablet TAKE ONE TABLET BY MOUTH EVERY DAY Patient taking differently: Take 75 mg by mouth daily.  02/13/19   Bensimhon, Shaune Pascal, MD  colchicine 0.6 MG tablet Take 1 tablet (0.6 mg total) by mouth daily as needed (gout flare). 09/03/18   Johnson, Megan P, DO  cyclobenzaprine (FLEXERIL) 10 MG tablet Take 1 tablet (10 mg total) by mouth at bedtime. 05/21/19   Park Liter P, DO  diclofenac sodium (VOLTAREN) 1 % GEL Apply 2 g topically 4 (four) times daily as needed (pain).     [provider]  fluticasone (FLONASE) 50 MCG/ACT nasal spray Place 2 sprays into both nostrils daily. Patient taking differently: Place 2 sprays into both nostrils daily as needed for allergies.  06/20/17   [name], MD  fluticasone-salmeterol (ADVAIR HFA) [phone] MCG/ACT inhaler Inhale 2 puffs into the lungs 2 (two) times  daily. 08/12/18   [name], MD  furosemide (LASIX) 40 MG tablet Take 1 tablet (40 mg total) by mouth daily for 5 days. 05/30/19 06/04/19  Duanne Guess, PA-C  Magnesium 400 MG TABS Take 1 tablet by mouth 2 (two) times daily. 04/22/19   [name], FNP  methimazole (TAPAZOLE) 10 MG tablet Take 4 tablets (40 mg total) by mouth daily. 12/09/18   Mayo, Pete Pelt, MD  metolazone (ZAROXOLYN) 2.5 MG tablet Take 2.5 mg by mouth as needed.  09/05/18   [provider]  Multiple Vitamin (MULTIVITAMIN WITH MINERALS) TABS tablet Take 1 tablet by mouth daily.    [provider]  nitroGLYCERIN (NITROSTAT) 0.4 MG SL tablet Place 1 tablet (0.4 mg total) under the tongue every 5 (five) minutes as needed for chest pain. 04/22/19   [name], FNP  ondansetron (ZOFRAN ODT) 4 MG disintegrating tablet Take 1 tablet (4 mg total) by mouth every 8 (eight) hours as needed for nausea or  vomiting. 05/30/19   Duanne Guess, PA-C  pantoprazole (PROTONIX) 40 MG tablet Take 1 tablet (40 mg total) by mouth 2 (two) times daily. 03/01/19   Fritzi Mandes, MD  polyethylene glycol (MIRALAX / GLYCOLAX) 17 g packet Take 17 g by mouth daily. 04/20/19   Bettey Costa, MD  potassium chloride SA (K-DUR,KLOR-CON) 20 MEQ tablet Take 1 tablet (20 mEq total) by mouth 2 (two) times daily. Patient taking differently: Take 20 mEq by mouth 4 (four) times daily.  03/01/19   Fritzi Mandes, MD  promethazine (PHENERGAN) 12.5 MG tablet Take 1 tablet (12.5 mg total) by mouth every 6 (six) hours as needed for nausea or vomiting. Patient not taking: Reported on 05/21/2019 11/24/18   Nance Pear, MD  sennosides-docusate sodium (SENOKOT-S) 8.6-50 MG tablet Take 1 tablet by mouth daily.    [provider]  sodium chloride (OCEAN) 0.65 % SOLN nasal spray Place 1 spray into both nostrils as needed for congestion.    [provider]  spironolactone (ALDACTONE) 25 MG tablet Take 0.5 tablets (12.5 mg total) by mouth daily.  04/15/19   Vaughan Basta, MD  Tiotropium Bromide Monohydrate (SPIRIVA RESPIMAT) 2.5 MCG/ACT AERS Inhale 1 puff into the lungs daily. 08/12/18   [name], MD  torsemide (DEMADEX) 20 MG tablet Take 1 tablet (20 mg total) by mouth 2 (two) times daily. 04/15/19   Vaughan Basta, MD     Allergies Lisinopril   Family History  Problem Relation Age of Onset  . Hypertension Mother   . Heart failure Mother   . Hypertension Father   . CAD Father   . Heart attack Father     Social History Social History   Tobacco Use  . Smoking status: Former Smoker    Packs/day: 0.33    Years: 33.00    Pack years: 10.89    Types: Cigarettes    Quit date: 02/20/2016    Years since quitting: 3.3  . Smokeless tobacco: Never Used  Substance Use Topics  . Alcohol use: Yes    Alcohol/week: 0.0 standard drinks    Comment: 06/10/2018 "beer once/month; if that"  . Drug use: Never    Review of Systems  Constitutional:   No fever or chills.  ENT:   No sore throat. No rhinorrhea. Cardiovascular:   Positive as above chest pain without syncope. Respiratory:   Positive shortness of breath without cough. Gastrointestinal:   Negative for abdominal pain, vomiting and diarrhea.  Musculoskeletal:   Negative for focal pain or swelling All other systems reviewed and are negative except as documented above in ROS and HPI.  ____________________________________________   PHYSICAL EXAM:  VITAL SIGNS: ED Triage Vitals  Enc Vitals Group     BP 06/12/19 1805 111/86     Pulse Rate 06/12/19 1805 91     Resp 06/12/19 1805 18     Temp 06/12/19 1805 (!) 97.5 F (36.4 C)     Temp Source 06/12/19 1805 Oral     SpO2 06/12/19 1805 98 %     Weight 06/12/19 1805 214 lb (97.1 kg)     Height 06/12/19 1805 6' 1.25" (1.861 m)     Head Circumference --      Peak Flow --      Pain Score 06/12/19 1813 5     Pain Loc --      Pain Edu? --      Excl. in [place]? --     Vital signs reviewed, nursing assessments  reviewed.  Constitutional:   Alert and oriented. Non-toxic appearance. Eyes:   Conjunctivae are normal. EOMI. PERRL. ENT      Head:   Normocephalic and atraumatic.      Nose:   No congestion/rhinnorhea.       Mouth/Throat:   MMM, no pharyngeal erythema. No peritonsillar mass.       Neck:   No meningismus. Full ROM. Hematological/Lymphatic/Immunilogical:   No cervical lymphadenopathy. Cardiovascular:   RRR. Symmetric bilateral radial and DP pulses.  No murmurs. Cap refill less than 2 seconds. Respiratory:   Tachypnea without accessory muscle use.  Fine crackles at bilateral bases.  Symmetric air entry. Gastrointestinal:   Soft and nontender. Non distended. There is no CVA tenderness.  No rebound, rigidity, or guarding.  Musculoskeletal:   Normal range of motion in all extremities. No joint effusions.  No lower extremity tenderness.  1+ pitting edema bilateral lower extremities. Neurologic:   Normal speech and language.  Motor grossly intact. No acute focal neurologic deficits are appreciated.  Skin:    Skin is warm, dry and intact. No rash noted.  No petechiae, purpura, or bullae.  ____________________________________________    LABS (pertinent positives/negatives) (all labs ordered are listed, but only abnormal results are displayed) Labs Reviewed  BASIC METABOLIC PANEL - Abnormal; Notable for the following components:      Result Value   Sodium 134 (*)    CO2 19 (*)    Glucose, Bld 172 (*)    BUN 27 (*)    Creatinine, Ser 1.91 (*)    GFR calc non Af Amer 39 (*)    GFR calc Af Amer 45 (*)    All other components within normal limits  CBC - Abnormal; Notable for the following components:   RDW 17.1 (*)    All other components within normal limits  TROPONIN I - Abnormal; Notable for the following components:   Troponin I 0.07 (*)    All other components within normal limits  HEPATIC FUNCTION PANEL - Abnormal; Notable for the following components:   AST 14 (*)    All other  components within normal limits  SARS CORONAVIRUS 2 (HOSPITAL ORDER, [place] [facility])  PROTIME-INR  LIPASE, BLOOD   ____________________________________________   EKG  Interpreted by me Sinus rhythm rate of 92, right axis, left bundle branch block, no acute ischemic changes.  QTc 660 ms.  ____________________________________________    RADIOLOGY  Dg Chest 2 View  Result Date: 06/12/2019 CLINICAL DATA:  Chest pain EXAM: CHEST - 2 VIEW COMPARISON:  05/30/2019 FINDINGS: The heart size is enlarged. There is volume overload with findings of developing pulmonary edema. There are more focal areas of increased attenuation in the retrocardiac region and at the right lung base. No pneumothorax. A left-sided ICD is noted. There is no acute osseous abnormality. IMPRESSION: 1. Cardiomegaly with developing pulmonary edema. 2. More focal airspace opacities in the bilateral lower lung zones favored to represent sequela of developing pulmonary edema as opposed to a developing infectious process. Attention on follow-up examinations is recommended. Electronically Signed   By: Constance Holster M.D.   On: 06/12/2019 19:38    ____________________________________________   PROCEDURES Procedures  ____________________________________________    CLINICAL IMPRESSION / ASSESSMENT AND PLAN / ED COURSE  Medications ordered in the ED: Medications  sodium chloride flush (NS) 0.9 % injection 3 mL (3 mLs Intravenous Not Given 06/12/19 1922)  furosemide (LASIX) injection 40 mg (40 mg Intravenous Given 06/12/19 2129)  Pertinent labs & imaging results that were available during my care of the patient were reviewed by me and considered in my medical decision making (see chart for details).  MARKEITH JUE was evaluated in Emergency Department on 06/12/2019 for the symptoms described in the history of present illness. He was evaluated in the context of the global COVID-19 pandemic,  which necessitated consideration that the patient might be at risk for infection with the SARS-CoV-2 virus that causes COVID-19. Institutional protocols and algorithms that pertain to the evaluation of patients at risk for COVID-19 are in a state of rapid change based on information released by regulatory bodies including the CDC and federal and state organizations. These policies and algorithms were followed during the patient's care in the ED.   Patient presents with persistent shortness of breath, worsening, syndrome consistent with exacerbation of heart failure.  Exam is consistent with pulmonary edema.  Doubt ACS PE dissection AAA pneumothorax pericarditis pneumonia or sepsis.  Troponin is elevated the point of 7 on labs, slightly increased from a baseline of 0.04, consistent with increased heart strain.  Doubt non-STEMI or unstable angina.  Patient has had multiple presentations over the past month but is nonetheless having worsening symptoms despite maximal outpatient therapy.  With failure of outpatient management, IV Lasix ordered.  COVID screen ordered.  Discussed with hospitalist for further management.      ____________________________________________   FINAL CLINICAL IMPRESSION(S) / ED DIAGNOSES    Final diagnoses:  Shortness of breath  Acute pulmonary edema ([place])  Acute on chronic congestive heart failure, unspecified heart failure type ([place])  Failure of outpatient treatment     ED Discharge Orders    None      Portions of this note were generated with dragon dictation software. Dictation errors may occur despite best attempts at proofreading.   Carrie Mew, MD 06/12/19 2206

## 2019-06-12 NOTE — ED Notes (Addendum)
Patient c/o central chest pain (now resolved), neck pain, left arm pain, SOB, and decreased urination.   Patient reports that he is scheduled to go to [facility] for to be evaluated for heart transplant. Patient's EF is 15%.   Patient reports his torsemide was recently increased. Patient reports some renal issues. Patient reports he has gained 8lb this week.

## 2019-06-12 NOTE — H&P (Signed)
[facility] [place] NAME: Stephen [name]    MR#:  [id number]  DATE OF BIRTH:  1966/10/17  DATE OF ADMISSION:  06/12/2019  PRIMARY CARE PHYSICIAN: Valerie Roys, DO   REQUESTING/REFERRING PHYSICIAN: Joni Fears, MD  CHIEF COMPLAINT:   Chief Complaint  Patient presents with  . Shortness of Breath  . Chest Pain    HISTORY OF PRESENT ILLNESS:  Stephen [name]  is a 53 y.o. male who presents with chief complaint as above.  Patient presents to the ED with a complaint of shortness of breath and chest discomfort.  He also states he has had some abdominal swelling and recent increase in weight of about 8 pounds.  He has a known history of heart failure and has been in our facility somewhat frequently lately.  He is on several different diuretic medications, and states that he tries to adhere to it strict fluid and salt restricted diet.  His work-up here tonight is consistent with exacerbation of his heart failure.  Hospitalist were called for admission  PAST MEDICAL HISTORY:   Past Medical History:  Diagnosis Date  . AICD (automatic cardioverter/defibrillator) present   . AKI (acute kidney injury) ([place]) 12/24/2017  . Arrhythmia   . Arthritis    "lower back, knees" (06/10/2018)  . Cardiac arrest ([place]) 12/23/2017   Brief V-fib arrest  . Chest pain 09/08/2017  . CHF (congestive heart failure) (HCC)    nonischemic cardiomyopathy, EF 25%  . Gout    "on daily RX" (06/10/2018)  . Headache    "q couple months" (06/10/2018)  . High cholesterol   . History of kidney stones   . Hypertension   . Influenza A 12/24/2017  . NICM (nonischemic cardiomyopathy) ([place])   . Pneumonia 12/20/2017  . TIA (transient ischemic attack) 06/21/2016   "still affects my memory a little bit" (06/10/2018)     PAST SURGICAL HISTORY:   Past Surgical History:  Procedure Laterality Date  . EXTERNAL FIXATION LEG Right ~ 2000   "was going bowlegged; had to brake  my leg to fix it"  . HERNIA REPAIR    . ICD IMPLANT N/A 12/30/2017   Procedure: ICD IMPLANT;  Surgeon: Deboraha Sprang, MD;  Location: [facility];  Service: Cardiovascular;  Laterality: N/A;  . LEFT HEART CATH AND CORONARY ANGIOGRAPHY N/A 09/09/2017   Procedure: LEFT HEART CATH AND CORONARY ANGIOGRAPHY;  Surgeon: Teodoro Spray, MD;  Location: [facility];  Service: Cardiovascular;  Laterality: N/A;  . UMBILICAL HERNIA REPAIR  1990s  . V TACH ABLATION  06/10/2018  . V TACH ABLATION N/A 06/10/2018   Procedure: V TACH ABLATION;  Surgeon: Thompson Grayer, MD;  Location: [facility];  Service: Cardiovascular;  Laterality: N/A;  . VASECTOMY       SOCIAL HISTORY:   Social History   Tobacco Use  . Smoking status: Former Smoker    Packs/day: 0.33    Years: 33.00    Pack years: 10.89    Types: Cigarettes    Quit date: 02/20/2016    Years since quitting: 3.3  . Smokeless tobacco: Never Used  Substance Use Topics  . Alcohol use: Yes    Alcohol/week: 0.0 standard drinks    Comment: 06/10/2018 "beer once/month; if that"     FAMILY HISTORY:   Family History  Problem Relation Age of Onset  . Hypertension Mother   . Heart failure Mother   . Hypertension Father   .  CAD Father   . Heart attack Father      DRUG ALLERGIES:   Allergies  Allergen Reactions  . Lisinopril Cough    MEDICATIONS AT HOME:   Prior to Admission medications   Medication Sig Start Date End Date Taking? Authorizing Provider  acetaminophen (TYLENOL) 325 MG tablet Take 2 tablets (650 mg total) by mouth every 6 (six) hours as needed. Patient taking differently: Take 325-650 mg by mouth every 6 (six) hours as needed for moderate pain.  03/19/18   Carrie Mew, MD  albuterol (PROVENTIL HFA;VENTOLIN HFA) 108 (90 Base) MCG/ACT inhaler Inhale 2 puffs into the lungs every 4 (four) hours as needed for wheezing or shortness of breath. 09/19/18   Johnson, Megan P, DO  allopurinol (ZYLOPRIM) 100  MG tablet Take 2 tablets (200 mg total) by mouth daily. Patient taking differently: Take 100 mg by mouth 2 (two) times daily.  10/19/18   Johnson, Megan P, DO  amiodarone (PACERONE) 200 MG tablet Take 200 mg by mouth 2 (two) times daily.    [provider]  aspirin EC 81 MG EC tablet Take 1 tablet (81 mg total) by mouth daily. 01/01/18   Daune Perch, NP  atorvastatin (LIPITOR) 40 MG tablet Take 1 tablet (40 mg total) by mouth daily at 6 PM. 04/22/19   [name], FNP  carvedilol (COREG) 3.125 MG tablet Take 3.125 mg by mouth 2 (two) times daily with a meal.    [provider]  chlorpheniramine-HYDROcodone (TUSSIONEX PENNKINETIC ER) 10-8 MG/5ML SUER Take 5 mLs by mouth at bedtime as needed for cough. Patient not taking: Reported on 06/01/2019 05/30/19   Duanne Guess, PA-C  clopidogrel (PLAVIX) 75 MG tablet TAKE ONE TABLET BY MOUTH EVERY DAY Patient taking differently: Take 75 mg by mouth daily.  02/13/19   Bensimhon, Shaune Pascal, MD  colchicine 0.6 MG tablet Take 1 tablet (0.6 mg total) by mouth daily as needed (gout flare). 09/03/18   Johnson, Megan P, DO  cyclobenzaprine (FLEXERIL) 10 MG tablet Take 1 tablet (10 mg total) by mouth at bedtime. 05/21/19   Park Liter P, DO  diclofenac sodium (VOLTAREN) 1 % GEL Apply 2 g topically 4 (four) times daily as needed (pain).     [provider]  fluticasone (FLONASE) 50 MCG/ACT nasal spray Place 2 sprays into both nostrils daily. Patient taking differently: Place 2 sprays into both nostrils daily as needed for allergies.  06/20/17   [name], MD  fluticasone-salmeterol (ADVAIR HFA) [id number] MCG/ACT inhaler Inhale 2 puffs into the lungs 2 (two) times daily. 08/12/18   [name], MD  furosemide (LASIX) 40 MG tablet Take 1 tablet (40 mg total) by mouth daily for 5 days. 05/30/19 06/04/19  Duanne Guess, PA-C  Magnesium 400 MG TABS Take 1 tablet by mouth 2 (two) times daily. 04/22/19   [name], FNP  methimazole  (TAPAZOLE) 10 MG tablet Take 4 tablets (40 mg total) by mouth daily. 12/09/18   Mayo, Pete Pelt, MD  metolazone (ZAROXOLYN) 2.5 MG tablet Take 2.5 mg by mouth as needed.  09/05/18   [provider]  Multiple Vitamin (MULTIVITAMIN WITH MINERALS) TABS tablet Take 1 tablet by mouth daily.    [provider]  nitroGLYCERIN (NITROSTAT) 0.4 MG SL tablet Place 1 tablet (0.4 mg total) under the tongue every 5 (five) minutes as needed for chest pain. 04/22/19   [name], FNP  ondansetron (ZOFRAN ODT) 4 MG disintegrating tablet Take 1 tablet (  4 mg total) by mouth every 8 (eight) hours as needed for nausea or vomiting. 05/30/19   Duanne Guess, PA-C  pantoprazole (PROTONIX) 40 MG tablet Take 1 tablet (40 mg total) by mouth 2 (two) times daily. 03/01/19   Fritzi Mandes, MD  polyethylene glycol (MIRALAX / GLYCOLAX) 17 g packet Take 17 g by mouth daily. 04/20/19   Bettey Costa, MD  potassium chloride SA (K-DUR,KLOR-CON) 20 MEQ tablet Take 1 tablet (20 mEq total) by mouth 2 (two) times daily. Patient taking differently: Take 20 mEq by mouth 4 (four) times daily.  03/01/19   Fritzi Mandes, MD  promethazine (PHENERGAN) 12.5 MG tablet Take 1 tablet (12.5 mg total) by mouth every 6 (six) hours as needed for nausea or vomiting. Patient not taking: Reported on 05/21/2019 11/24/18   Nance Pear, MD  sennosides-docusate sodium (SENOKOT-S) 8.6-50 MG tablet Take 1 tablet by mouth daily.    [provider]  sodium chloride (OCEAN) 0.65 % SOLN nasal spray Place 1 spray into both nostrils as needed for congestion.    [provider]  spironolactone (ALDACTONE) 25 MG tablet Take 0.5 tablets (12.5 mg total) by mouth daily. 04/15/19   Vaughan Basta, MD  Tiotropium Bromide Monohydrate (SPIRIVA RESPIMAT) 2.5 MCG/ACT AERS Inhale 1 puff into the lungs daily. 08/12/18   [name], MD  torsemide (DEMADEX) 20 MG tablet Take 1 tablet (20 mg total) by mouth 2 (two) times daily. 04/15/19    Vaughan Basta, MD    REVIEW OF SYSTEMS:  Review of Systems  Constitutional: Negative for chills, fever, malaise/fatigue and weight loss.  HENT: Negative for ear pain, hearing loss and tinnitus.   Eyes: Negative for blurred vision, double vision, pain and redness.  Respiratory: Positive for shortness of breath. Negative for cough and hemoptysis.   Cardiovascular: Positive for chest pain. Negative for palpitations, orthopnea and leg swelling.  Gastrointestinal: Negative for abdominal pain, constipation, diarrhea, nausea and vomiting.  Genitourinary: Negative for dysuria, frequency and hematuria.  Musculoskeletal: Negative for back pain, joint pain and neck pain.  Skin:       No acne, rash, or lesions  Neurological: Negative for dizziness, tremors, focal weakness and weakness.  Endo/Heme/Allergies: Negative for polydipsia. Does not bruise/bleed easily.  Psychiatric/Behavioral: Negative for depression. The patient is not nervous/anxious and does not have insomnia.      VITAL SIGNS:   Vitals:   06/12/19 2000 06/12/19 2030 06/12/19 2100 06/12/19 2131  BP: (!) 113/94 106/90 106/88 98/76  Pulse: 79 74  70  Resp: (!) 21 14 (!) 28 (!) 21  Temp:      TempSrc:      SpO2: 98% 96%  98%  Weight:      Height:       Wt Readings from Last 3 Encounters:  06/12/19 97.1 kg  05/30/19 98.9 kg  05/21/19 99.8 kg    PHYSICAL EXAMINATION:  Physical Exam  Vitals reviewed. Constitutional: He is oriented to person, place, and time. He appears well-developed and well-nourished. No distress.  HENT:  Head: Normocephalic and atraumatic.  Mouth/Throat: Oropharynx is clear and moist.  Eyes: Pupils are equal, round, and reactive to light. Conjunctivae and EOM are normal. No scleral icterus.  Neck: Normal range of motion. Neck supple. No JVD present. No thyromegaly present.  Cardiovascular: Normal rate, regular rhythm and intact distal pulses. Exam reveals no gallop and no friction rub.  No  murmur heard. Respiratory: Effort normal. No respiratory distress. He has no wheezes. He has rales.  GI: Soft. Bowel sounds are normal. He exhibits no distension. There is no abdominal tenderness.  Musculoskeletal: Normal range of motion.        General: No edema.     Comments: No arthritis, no gout  Lymphadenopathy:    He has no cervical adenopathy.  Neurological: He is alert and oriented to person, place, and time. No cranial nerve deficit.  No dysarthria, no aphasia  Skin: Skin is warm and dry. No rash noted. No erythema.  Psychiatric: He has a normal mood and affect. His behavior is normal. Judgment and thought content normal.    LABORATORY PANEL:   CBC Recent Labs  Lab 06/12/19 1818  WBC 8.1  HGB 14.3  HCT 44.2  PLT 384   ------------------------------------------------------------------------------------------------------------------  Chemistries  Recent Labs  Lab 06/12/19 1818  NA 134*  K 4.8  CL 103  CO2 19*  GLUCOSE 172*  BUN 27*  CREATININE 1.91*  CALCIUM 9.4  AST 14*  ALT 14  ALKPHOS 104  BILITOT 0.7   ------------------------------------------------------------------------------------------------------------------  Cardiac Enzymes Recent Labs  Lab 06/12/19 1818  TROPONINI 0.07*   ------------------------------------------------------------------------------------------------------------------  RADIOLOGY:  Dg Chest 2 View  Result Date: 06/12/2019 CLINICAL DATA:  Chest pain EXAM: CHEST - 2 VIEW COMPARISON:  05/30/2019 FINDINGS: The heart size is enlarged. There is volume overload with findings of developing pulmonary edema. There are more focal areas of increased attenuation in the retrocardiac region and at the right lung base. No pneumothorax. A left-sided ICD is noted. There is no acute osseous abnormality. IMPRESSION: 1. Cardiomegaly with developing pulmonary edema. 2. More focal airspace opacities in the bilateral lower lung zones favored to  represent sequela of developing pulmonary edema as opposed to a developing infectious process. Attention on follow-up examinations is recommended. Electronically Signed   By: Constance Holster M.D.   On: 06/12/2019 19:38    EKG:   Orders placed or performed during the hospital encounter of 06/12/19  . EKG 12-Lead  . EKG 12-Lead  . ED EKG  . ED EKG    IMPRESSION AND PLAN:  Principal Problem:   Acute on chronic systolic CHF (congestive heart failure) (HCC) -IV diuresis given in the ED, we will continue diuretics, trend his cardiac enzymes as his troponin was mildly elevated in the ED (though I suspect this is demand ischemia), and get a cardiology consult. Active Problems:   COPD (chronic obstructive pulmonary disease) (HCC) -continue home meds   HTN (hypertension) -home dose antihypertensives   CKD (chronic kidney disease) stage 3, GFR 30-59 ml/min (HCC) -at baseline, avoid nephrotoxins and monitor   Hyperlipidemia -home dose antilipid  Chart review performed and case discussed with ED provider. Labs, imaging and/or ECG reviewed by provider and discussed with patient/family. Management plans discussed with the patient and/or family.  COVID-19 status: Pending  DVT PROPHYLAXIS: SubQ lovenox   GI PROPHYLAXIS:  None  ADMISSION STATUS: Inpatient     CODE STATUS: Full Code Status History    Date Active Date Inactive Code Status Order ID Comments User Context   05/02/2019 1909 05/04/2019 1757 Full Code [id number]  Otila Back, MD ED   04/19/2019 0019 04/19/2019 1732 Full Code [id number]  Lance Coon, MD Inpatient   04/07/2019 1426 04/15/2019 2146 Full Code [id number]  Hillary Bow, MD ED   02/18/2019 0021 03/02/2019 0025 Full Code [id number]  [name], MD ED   02/13/2019 1451 02/14/2019 2116 Full Code [id number]  Hillary Bow, MD ED   12/27/2018 0600 12/28/2018 1829 Partial Code [id number]  Harrie Foreman, MD Inpatient   12/04/2018 0437 12/10/2018 2037 Partial Code [id number]  Bradly Bienenstock, NP Inpatient   12/04/2018 0147 12/04/2018 0437 Full Code [id number]  Henreitta Leber, MD ED   08/19/2018 2248 08/20/2018 2258 Full Code [id number]  Amelia Jo, MD Inpatient   06/30/2018 1757 07/06/2018 1647 Full Code [id number]  [name], MD Inpatient   06/10/2018 1606 06/11/2018 1537 Full Code [id number]  Thompson Grayer, MD Inpatient   12/23/2017 1153 12/31/2017 1729 Full Code [id number]  Ilean China Inpatient   12/20/2017 1851 12/23/2017 1110 Full Code [id number]  [name], MD Inpatient   09/08/2017 0644 09/10/2017 2127 Full Code [id number]  Harrie Foreman, MD Inpatient   07/29/2017 1044 07/31/2017 2152 Full Code [id number]  Henreitta Leber, MD Inpatient   06/19/2017 0027 06/20/2017 2114 Full Code [id number]  Lance Coon, MD ED   05/20/2017 1226 05/21/2017 1759 Full Code [id number]  [name], MD Inpatient   06/22/2016 0430 06/22/2016 2150 Full Code [id number]  Quintella Baton, MD Inpatient   06/04/2016 1843 06/06/2016 1815 Full Code [id number]  [name], MD Inpatient   Advance Care Planning Activity      TOTAL TIME TAKING CARE OF THIS PATIENT: 45 minutes.   This patient was evaluated in the context of the global COVID-19 pandemic, which necessitated consideration that the patient might be at risk for infection with the SARS-CoV-2 virus that causes COVID-19. Institutional protocols and algorithms that pertain to the evaluation of patients at risk for COVID-19 are in a state of rapid change based on information released by regulatory bodies including the CDC and federal and state organizations. These policies and algorithms were followed to the best of this provider's knowledge to date during the patient's care at this facility.  Ethlyn Daniels 06/12/2019, 10:12 PM  [facility]  Office  [phone]  CC: Primary care physician; Valerie Roys, DO  Note:  This document was prepared using Dragon voice recognition software and may include  unintentional dictation errors.

## 2019-06-12 NOTE — ED Notes (Signed)
ED TO INPATIENT HANDOFF REPORT  ED Nurse Name and Phone #: Balinda Quails Name/Age/Gender Stephen [name] 53 y.o. male Room/Bed: ED26A/ED26A  Code Status   Code Status: Prior  Home/SNF/Other Home Patient oriented to: self, place, time and situation Is this baseline? Yes   Triage Complete: Triage complete  Chief Complaint SOB  Triage Note Pt to ED from home c/o SOB for 3-4 hours PTA after taking fluid medication today.  States also n/v x6 today, denies blood in vomit.  Pt speaking in complete and coherent sentences, chest rise even and unlabored, skin warm and dry in NAD at this time.   Allergies Allergies  Allergen Reactions  . Lisinopril Cough    Level of Care/Admitting Diagnosis ED Disposition    ED Disposition Condition [facility] Area: [place] [[id number]]  Level of Care: Telemetry [5]  Covid Evaluation: Person Under Investigation (PUI)  Isolation Risk Level: Low Risk/Droplet (Less than 4L [place] supplementation)  Diagnosis: Acute on chronic systolic CHF (congestive heart failure) [facility]) [250539]  Admitting Physician: Lance Coon [[id number]]  Attending Physician: Jannifer Franklin, DAVID [phone]  Estimated length of stay: past midnight tomorrow  Certification:: I certify this patient will need inpatient services for at least 2 midnights  Bed request comments: 2a  PT Class (Do Not Modify): Inpatient [101]  PT Acc Code (Do Not Modify): Private [1]       B Medical/Surgery History Past Medical History:  Diagnosis Date  . AICD (automatic cardioverter/defibrillator) present   . AKI (acute kidney injury) ([place]) 12/24/2017  . Arrhythmia   . Arthritis    "lower back, knees" (06/10/2018)  . Cardiac arrest ([place]) 12/23/2017   Brief V-fib arrest  . Chest pain 09/08/2017  . CHF (congestive heart failure) (HCC)    nonischemic cardiomyopathy, EF 25%  . Gout    "on daily RX" (06/10/2018)  . Headache    "q couple months" (06/10/2018)  . High  cholesterol   . History of kidney stones   . Hypertension   . Influenza A 12/24/2017  . NICM (nonischemic cardiomyopathy) ([place])   . Pneumonia 12/20/2017  . TIA (transient ischemic attack) 06/21/2016   "still affects my memory a little bit" (06/10/2018)   Past Surgical History:  Procedure Laterality Date  . EXTERNAL FIXATION LEG Right ~ 2000   "was going bowlegged; had to brake my leg to fix it"  . HERNIA REPAIR    . ICD IMPLANT N/A 12/30/2017   Procedure: ICD IMPLANT;  Surgeon: Deboraha Sprang, MD;  Location: [facility];  Service: Cardiovascular;  Laterality: N/A;  . LEFT HEART CATH AND CORONARY ANGIOGRAPHY N/A 09/09/2017   Procedure: LEFT HEART CATH AND CORONARY ANGIOGRAPHY;  Surgeon: Teodoro Spray, MD;  Location: [facility];  Service: Cardiovascular;  Laterality: N/A;  . UMBILICAL HERNIA REPAIR  1990s  . V TACH ABLATION  06/10/2018  . V TACH ABLATION N/A 06/10/2018   Procedure: V TACH ABLATION;  Surgeon: Thompson Grayer, MD;  Location: [facility];  Service: Cardiovascular;  Laterality: N/A;  . VASECTOMY       A IV Location/Drains/Wounds Patient Lines/Drains/Airways Status   Active Line/Drains/Airways    Name:   Placement date:   Placement time:   Site:   Days:   Peripheral IV 06/12/19 Right Hand   06/12/19    2126    Hand   less than 1          Intake/Output Last  24 hours  Intake/Output Summary (Last 24 hours) at 06/12/2019 2300 Last data filed at 06/12/2019 2244 Gross per 24 hour  Intake -  Output 380 ml  Net -380 ml    Labs/Imaging Results for orders placed or performed during the hospital encounter of 06/12/19 (from the past 48 hour(s))  Basic metabolic panel     Status: Abnormal   Collection Time: 06/12/19  6:18 PM  Result Value Ref Range   Sodium 134 (L) 135 - 145 mmol/L   Potassium 4.8 3.5 - 5.1 mmol/L   Chloride 103 98 - 111 mmol/L   CO2 19 (L) 22 - 32 mmol/L   Glucose, Bld 172 (H) 70 - 99 mg/dL   BUN 27 (H) 6 - 20 mg/dL    Creatinine, Ser 1.91 (H) 0.61 - 1.24 mg/dL   Calcium 9.4 8.9 - 10.3 mg/dL   GFR calc non Af Amer 39 (L) >60 mL/min   GFR calc Af Amer 45 (L) >60 mL/min   Anion gap 12 5 - 15    Comment: Performed at [facility], [address] [place], [place] [address]  CBC     Status: Abnormal   Collection Time: 06/12/19  6:18 PM  Result Value Ref Range   WBC 8.1 4.0 - 10.5 K/uL   RBC 5.02 4.22 - 5.81 MIL/uL   Hemoglobin 14.3 13.0 - 17.0 g/dL   HCT 44.2 39.0 - 52.0 %   MCV 88.0 80.0 - 100.0 fL   MCH 28.5 26.0 - 34.0 pg   MCHC 32.4 30.0 - 36.0 g/dL   RDW 17.1 (H) 11.5 - 15.5 %   Platelets 384 150 - 400 K/uL   nRBC 0.0 0.0 - 0.2 %    Comment: Performed at [facility], [address] [place], [place] [address]  Troponin I - ONCE - STAT     Status: Abnormal   Collection Time: 06/12/19  6:18 PM  Result Value Ref Range   Troponin I 0.07 (HH) <0.03 ng/mL    Comment: CRITICAL RESULT CALLED TO, READ BACK BY AND VERIFIED WITH ANNA HOLT @1846  06/12/19 MJU Performed at [facility], [address] [place], [place] [address]   Protime-INR (order if Patient is taking Coumadin / Warfarin)     Status: None   Collection Time: 06/12/19  6:18 PM  Result Value Ref Range   Prothrombin Time 13.9 11.4 - 15.2 seconds   INR 1.1 0.8 - 1.2    Comment: (NOTE) INR goal varies based on device and disease states. Performed at [facility], [address] [place], [place] [address]   Hepatic function panel     Status: Abnormal   Collection Time: 06/12/19  6:18 PM  Result Value Ref Range   Total Protein 7.5 6.5 - 8.1 g/dL   Albumin 3.5 3.5 - 5.0 g/dL   AST 14 (L) 15 - 41 U/L   ALT 14 0 - 44 U/L   Alkaline Phosphatase 104 38 - 126 U/L   Total Bilirubin 0.7 0.3 - 1.2 mg/dL   Bilirubin, Direct 0.2 0.0 - 0.2 mg/dL   Indirect Bilirubin 0.5 0.3 - 0.9 mg/dL    Comment: Performed at [facility], [address] [place], [place] [address]  Lipase, blood     Status:  None   Collection Time: 06/12/19  6:18 PM  Result Value Ref Range   Lipase 20 11 - 51 U/L    Comment: Performed at [facility], [place]  [address] [place], [place] [address]   Dg Chest 2 View  Result Date: 06/12/2019 CLINICAL DATA:  Chest pain EXAM: CHEST - 2 VIEW COMPARISON:  05/30/2019 FINDINGS: The heart size is enlarged. There is volume overload with findings of developing pulmonary edema. There are more focal areas of increased attenuation in the retrocardiac region and at the right lung base. No pneumothorax. A left-sided ICD is noted. There is no acute osseous abnormality. IMPRESSION: 1. Cardiomegaly with developing pulmonary edema. 2. More focal airspace opacities in the bilateral lower lung zones favored to represent sequela of developing pulmonary edema as opposed to a developing infectious process. Attention on follow-up examinations is recommended. Electronically Signed   By: Constance Holster M.D.   On: 06/12/2019 19:38    Pending Labs Unresulted Labs (From admission, onward)    Start     Ordered   06/12/19 2114  SARS Coronavirus 2 (CEPHEID- Performed in [place] hospital lab), Hosp Order  (Symptomatic Patients Labs with Precautions )  ONCE - STAT,   STAT     06/12/19 2113   Signed and Held  CBC  (enoxaparin (LOVENOX)    CrCl >/= 30 ml/min)  Once,   R    Comments: Baseline for enoxaparin therapy IF NOT ALREADY DRAWN.  Notify MD if PLT < 100 K.    Signed and Held   Signed and Held  Creatinine, serum  (enoxaparin (LOVENOX)    CrCl >/= 30 ml/min)  Once,   R    Comments: Baseline for enoxaparin therapy IF NOT ALREADY DRAWN.    Signed and Held   Signed and Held  Creatinine, serum  (enoxaparin (LOVENOX)    CrCl >/= 30 ml/min)  Weekly,   R    Comments: while on enoxaparin therapy    Signed and Held   Signed and Held  Basic metabolic panel  Tomorrow morning,   R     Signed and Held   Signed and Held  CBC  Tomorrow morning,   R     Signed and Held           Vitals/Pain Today's Vitals   06/12/19 2000 06/12/19 2030 06/12/19 2100 06/12/19 2131  BP: (!) 113/94 106/90 106/88 98/76  Pulse: 79 74  70  Resp: (!) 21 14 (!) 28 (!) 21  Temp:      TempSrc:      SpO2: 98% 96%  98%  Weight:      Height:      PainSc:        Isolation Precautions Droplet and Contact precautions  Medications Medications  sodium chloride flush (NS) 0.9 % injection 3 mL (3 mLs Intravenous Not Given 06/12/19 1922)  furosemide (LASIX) injection 40 mg (40 mg Intravenous Given 06/12/19 2129)    Mobility walks Low fall risk   Focused Assessments Cardiac Assessment Handoff:    Lab Results  Component Value Date   TROPONINI 0.07 (HH) 06/12/2019   Lab Results  Component Value Date   DDIMER 1.13 (H) 09/27/2017   Does the Patient currently have chest pain? No     R Recommendations: See Admitting Provider Note  Report given to:   Additional Notes: EF of 15%

## 2019-06-12 NOTE — ED Notes (Signed)
Patient transported to X-ray 

## 2019-06-12 NOTE — ED Triage Notes (Signed)
Pt to ED from home c/o SOB for 3-4 hours PTA after taking fluid medication today.  States also n/v x6 today, denies blood in vomit.  Pt speaking in complete and coherent sentences, chest rise even and unlabored, skin warm and dry in NAD at this time.

## 2019-06-13 LAB — MAGNESIUM: Magnesium: 2.2 mg/dL (ref 1.7–2.4)

## 2019-06-13 LAB — CBC
HCT: 41.4 % (ref 39.0–52.0)
Hemoglobin: 13.4 g/dL (ref 13.0–17.0)
MCH: 28.3 pg (ref 26.0–34.0)
MCHC: 32.4 g/dL (ref 30.0–36.0)
MCV: 87.5 fL (ref 80.0–100.0)
Platelets: 337 10*3/uL (ref 150–400)
RBC: 4.73 MIL/uL (ref 4.22–5.81)
RDW: 16.7 % — ABNORMAL HIGH (ref 11.5–15.5)
WBC: 7.5 10*3/uL (ref 4.0–10.5)
nRBC: 0 % (ref 0.0–0.2)

## 2019-06-13 LAB — BASIC METABOLIC PANEL
Anion gap: 10 (ref 5–15)
BUN: 28 mg/dL — ABNORMAL HIGH (ref 6–20)
CO2: 25 mmol/L (ref 22–32)
Calcium: 9.2 mg/dL (ref 8.9–10.3)
Chloride: 102 mmol/L (ref 98–111)
Creatinine, Ser: 1.99 mg/dL — ABNORMAL HIGH (ref 0.61–1.24)
GFR calc Af Amer: 43 mL/min — ABNORMAL LOW (ref >60)
GFR calc non Af Amer: 37 mL/min — ABNORMAL LOW (ref >60)
Glucose, Bld: 151 mg/dL — ABNORMAL HIGH (ref 70–99)
Potassium: 3.8 mmol/L (ref 3.5–5.1)
Sodium: 137 mmol/L (ref 135–145)

## 2019-06-13 LAB — TROPONIN I
Troponin I: 0.1 ng/mL (ref <0.03)
Troponin I: 0.12 ng/mL (ref <0.03)

## 2019-06-13 MED ORDER — SENNOSIDES-DOCUSATE SODIUM 8.6-50 MG PO TABS
1.0000 | ORAL_TABLET | Freq: Every day | ORAL | Status: DC
  Administered 2019-06-13 – 2019-06-26 (×14): 1 via ORAL
  Filled 2019-06-13 (×15): qty 1

## 2019-06-13 MED ORDER — GUAIFENESIN-DM 100-10 MG/5ML PO SYRP
5.0000 mL | ORAL_SOLUTION | ORAL | Status: DC | PRN
  Administered 2019-06-13 – 2019-06-25 (×5): 5 mL via ORAL
  Filled 2019-06-13 (×5): qty 5

## 2019-06-13 MED ORDER — ENOXAPARIN SODIUM 40 MG/0.4ML ~~LOC~~ SOLN
40.0000 mg | SUBCUTANEOUS | Status: DC
  Administered 2019-06-13 – 2019-06-14 (×2): 40 mg via SUBCUTANEOUS
  Filled 2019-06-13: qty 0.4

## 2019-06-13 MED ORDER — POTASSIUM CHLORIDE 20 MEQ PO PACK
20.0000 meq | PACK | Freq: Once | ORAL | Status: AC
  Administered 2019-06-13: 20 meq via ORAL
  Filled 2019-06-13: qty 1

## 2019-06-13 MED ORDER — POLYETHYLENE GLYCOL 3350 17 G PO PACK
17.0000 g | PACK | Freq: Every day | ORAL | Status: DC
  Administered 2019-06-16 – 2019-06-17 (×2): 17 g via ORAL
  Filled 2019-06-13 (×9): qty 1

## 2019-06-13 NOTE — Consult Note (Signed)
Reason for Consult: Congestive heart failure shortness of breath chest pain Referring Physician: [place] primary Dr. Lance Coon hospitalist Cardiologist Dr D Clayborn Bigness  Stephen [name] is an 53 y.o. male.  HPI: 53 year old male with significant dyspnea shortness of breath known nonischemic cardiomyopathy ejection fraction of around 25% with AICD in place the patient has had 8 pound weight gain with swelling dyspnea and atypical chest pain so the patient presented for further evaluation even though she is taking diuretics and metolazone he has a history of renal sufficiency he has been restricting his fluid and salt but with his worsening symptoms he presented with more heart failure symptoms.  Patient denies any blackout spells or syncope no nausea or vomiting no fever chills or sweats  Past Medical History:  Diagnosis Date  . AICD (automatic cardioverter/defibrillator) present   . AKI (acute kidney injury) ([facility]) 12/24/2017  . Arrhythmia   . Arthritis    "lower back, knees" (06/10/2018)  . Cardiac arrest ([place]) 12/23/2017   Brief V-fib arrest  . Chest pain 09/08/2017  . CHF (congestive heart failure) ([facility])    nonischemic cardiomyopathy, EF 25%  . Gout    "on daily RX" (06/10/2018)  . Headache    "q couple months" (06/10/2018)  . High cholesterol   . History of kidney stones   . Hypertension   . Influenza A 12/24/2017  . NICM (nonischemic cardiomyopathy) ([place])   . Pneumonia 12/20/2017  . TIA (transient ischemic attack) 06/21/2016   "still affects my memory a little bit" (06/10/2018)    Past Surgical History:  Procedure Laterality Date  . EXTERNAL FIXATION LEG Right ~ 2000   "was going bowlegged; had to brake my leg to fix it"  . HERNIA REPAIR    . ICD IMPLANT N/A 12/30/2017   Procedure: ICD IMPLANT;  Surgeon: Deboraha Sprang, MD;  Location: [facility];  Service: Cardiovascular;  Laterality: N/A;  . LEFT HEART CATH AND CORONARY ANGIOGRAPHY N/A 09/09/2017   Procedure:  LEFT HEART CATH AND CORONARY ANGIOGRAPHY;  Surgeon: Teodoro Spray, MD;  Location: [facility];  Service: Cardiovascular;  Laterality: N/A;  . UMBILICAL HERNIA REPAIR  1990s  . V TACH ABLATION  06/10/2018  . V TACH ABLATION N/A 06/10/2018   Procedure: V TACH ABLATION;  Surgeon: Thompson Grayer, MD;  Location: [facility];  Service: Cardiovascular;  Laterality: N/A;  . VASECTOMY      Family History  Problem Relation Age of Onset  . Hypertension Mother   . Heart failure Mother   . Hypertension Father   . CAD Father   . Heart attack Father     Social History:  reports that he quit smoking about 3 years ago. His smoking use included cigarettes. He has a 10.89 pack-year smoking history. He has never used smokeless tobacco. He reports current alcohol use. He reports that he does not use drugs.  Allergies:  Allergies  Allergen Reactions  . Lisinopril Cough    Medications: I have reviewed the patient's current medications.  Results for orders placed or performed during the hospital encounter of 06/12/19 (from the past 48 hour(s))  Basic metabolic panel     Status: Abnormal   Collection Time: 06/12/19  6:18 PM  Result Value Ref Range   Sodium 134 (L) 135 - 145 mmol/L   Potassium 4.8 3.5 - 5.1 mmol/L   Chloride 103 98 - 111 mmol/L   CO2 19 (L) 22 - 32 mmol/L  Glucose, Bld 172 (H) 70 - 99 mg/dL   BUN 27 (H) 6 - 20 mg/dL   Creatinine, Ser 1.91 (H) 0.61 - 1.24 mg/dL   Calcium 9.4 8.9 - 10.3 mg/dL   GFR calc non Af Amer 39 (L) >60 mL/min   GFR calc Af Amer 45 (L) >60 mL/min   Anion gap 12 5 - 15    Comment: Performed at [facility], [address] [place], [place] [address]  CBC     Status: Abnormal   Collection Time: 06/12/19  6:18 PM  Result Value Ref Range   WBC 8.1 4.0 - 10.5 K/uL   RBC 5.02 4.22 - 5.81 MIL/uL   Hemoglobin 14.3 13.0 - 17.0 g/dL   HCT 44.2 39.0 - 52.0 %   MCV 88.0 80.0 - 100.0 fL   MCH 28.5 26.0 - 34.0 pg   MCHC 32.4 30.0 - 36.0  g/dL   RDW 17.1 (H) 11.5 - 15.5 %   Platelets 384 150 - 400 K/uL   nRBC 0.0 0.0 - 0.2 %    Comment: Performed at [facility], [address] [place], [place] [address]  Troponin I - ONCE - STAT     Status: Abnormal   Collection Time: 06/12/19  6:18 PM  Result Value Ref Range   Troponin I 0.07 (HH) <0.03 ng/mL    Comment: CRITICAL RESULT CALLED TO, READ BACK BY AND VERIFIED WITH ANNA HOLT @1846  06/12/19 MJU Performed at [facility], [address] [place], [place] [address]   Protime-INR (order if Patient is taking Coumadin / Warfarin)     Status: None   Collection Time: 06/12/19  6:18 PM  Result Value Ref Range   Prothrombin Time 13.9 11.4 - 15.2 seconds   INR 1.1 0.8 - 1.2    Comment: (NOTE) INR goal varies based on device and disease states. Performed at [facility], [address] [place], [place] [address]   Hepatic function panel     Status: Abnormal   Collection Time: 06/12/19  6:18 PM  Result Value Ref Range   Total Protein 7.5 6.5 - 8.1 g/dL   Albumin 3.5 3.5 - 5.0 g/dL   AST 14 (L) 15 - 41 U/L   ALT 14 0 - 44 U/L   Alkaline Phosphatase 104 38 - 126 U/L   Total Bilirubin 0.7 0.3 - 1.2 mg/dL   Bilirubin, Direct 0.2 0.0 - 0.2 mg/dL   Indirect Bilirubin 0.5 0.3 - 0.9 mg/dL    Comment: Performed at [facility], [address] [place], [place] [address]  Lipase, blood     Status: None   Collection Time: 06/12/19  6:18 PM  Result Value Ref Range   Lipase 20 11 - 51 U/L    Comment: Performed at [facility], [address], [place],  [address]  SARS Coronavirus 2 ([place]- Performed in [facility] hospital lab), Hosp Order     Status: None   Collection Time: 06/12/19  9:52 PM   Specimen: Nasopharyngeal Swab  Result Value Ref Range   SARS Coronavirus 2 NEGATIVE NEGATIVE    Comment: (NOTE) If result is NEGATIVE SARS-CoV-2 target nucleic acids are NOT DETECTED. The SARS-CoV-2 RNA is generally detectable in  upper and lower  respiratory specimens during the acute phase of infection. The lowest  concentration of SARS-CoV-2 viral copies this assay can detect is 250  copies / mL. A negative result does not preclude SARS-CoV-2 infection  and  should not be used as the sole basis for treatment or other  patient management decisions.  A negative result may occur with  improper specimen collection / handling, submission of specimen other  than nasopharyngeal swab, presence of viral mutation(s) within the  areas targeted by this assay, and inadequate number of viral copies  (<250 copies / mL). A negative result must be combined with clinical  observations, patient history, and epidemiological information. If result is POSITIVE SARS-CoV-2 target nucleic acids are DETECTED. The SARS-CoV-2 RNA is generally detectable in upper and lower  respiratory specimens dur ing the acute phase of infection.  Positive  results are indicative of active infection with SARS-CoV-2.  Clinical  correlation with patient history and other diagnostic information is  necessary to determine patient infection status.  Positive results do  not rule out bacterial infection or co-infection with other viruses. If result is PRESUMPTIVE POSTIVE SARS-CoV-2 nucleic acids MAY BE PRESENT.   A presumptive positive result was obtained on the submitted specimen  and confirmed on repeat testing.  While 2019 novel coronavirus  (SARS-CoV-2) nucleic acids may be present in the submitted sample  additional confirmatory testing may be necessary for epidemiological  and / or clinical management purposes  to differentiate between  SARS-CoV-2 and other Sarbecovirus currently known to infect humans.  If clinically indicated additional testing with an alternate test  methodology [phone]) is advised. The SARS-CoV-2 RNA is generally  detectable in upper and lower respiratory sp ecimens during the acute  phase of infection. The expected result is  Negative. Fact Sheet for Patients:  StrictlyIdeas.no Fact Sheet for Healthcare Providers: BankingDealers.co.za This test is not yet approved or cleared by the [place] FDA and has been authorized for detection and/or diagnosis of SARS-CoV-2 by FDA under an Emergency Use Authorization (EUA).  This EUA will remain in effect (meaning this test can be used) for the duration of the COVID-19 declaration under Section 564(b)(1) of the Act, 21 U.S.C. section 360bbb-3(b)(1), unless the authorization is terminated or revoked sooner. Performed at [facility], [address] [place], [place] [address]   Basic metabolic panel     Status: Abnormal   Collection Time: 06/13/19 12:50 AM  Result Value Ref Range   Sodium 137 135 - 145 mmol/L   Potassium 3.8 3.5 - 5.1 mmol/L   Chloride 102 98 - 111 mmol/L   CO2 25 22 - 32 mmol/L   Glucose, Bld 151 (H) 70 - 99 mg/dL   BUN 28 (H) 6 - 20 mg/dL   Creatinine, Ser 1.99 (H) 0.61 - 1.24 mg/dL   Calcium 9.2 8.9 - 10.3 mg/dL   GFR calc non Af Amer 37 (L) >60 mL/min   GFR calc Af Amer 43 (L) >60 mL/min   Anion gap 10 5 - 15    Comment: Performed at [facility], [address] [place], [place] [address]  CBC     Status: Abnormal   Collection Time: 06/13/19 12:50 AM  Result Value Ref Range   WBC 7.5 4.0 - 10.5 K/uL   RBC 4.73 4.22 - 5.81 MIL/uL   Hemoglobin 13.4 13.0 - 17.0 g/dL   HCT 41.4 39.0 - 52.0 %   MCV 87.5 80.0 - 100.0 fL   MCH 28.3 26.0 - 34.0 pg   MCHC 32.4 30.0 - 36.0 g/dL   RDW 16.7 (H) 11.5 - 15.5 %   Platelets 337 150 - 400 K/uL   nRBC 0.0 0.0 - 0.2 %  Comment: Performed at [facility], [place], [place] [address]  Troponin I - Now Then Q6H     Status: Abnormal   Collection Time: 06/13/19 12:50 AM  Result Value Ref Range   Troponin I 0.10 (HH) <0.03 ng/mL    Comment: CRITICAL VALUE NOTED. VALUE IS CONSISTENT WITH PREVIOUSLY  REPORTED/CALLED VALUE...[facility] Performed at [facility], [address] [place], [place] [address]   Troponin I - Now Then Q6H     Status: Abnormal   Collection Time: 06/13/19  6:41 AM  Result Value Ref Range   Troponin I 0.12 (HH) <0.03 ng/mL    Comment: CRITICAL VALUE NOTED. VALUE IS CONSISTENT WITH PREVIOUSLY REPORTED/CALLED VALUE  SDR Performed at [facility], [address] [place], [place] [address]   Magnesium     Status: None   Collection Time: 06/13/19  6:41 AM  Result Value Ref Range   Magnesium 2.2 1.7 - 2.4 mg/dL    Comment: Performed at [facility], [address] [place],  [address]    Dg Chest 2 View  Result Date: 06/12/2019 CLINICAL DATA:  Chest pain EXAM: CHEST - 2 VIEW COMPARISON:  05/30/2019 FINDINGS: The heart size is enlarged. There is volume overload with findings of developing pulmonary edema. There are more focal areas of increased attenuation in the retrocardiac region and at the right lung base. No pneumothorax. A left-sided ICD is noted. There is no acute osseous abnormality. IMPRESSION: 1. Cardiomegaly with developing pulmonary edema. 2. More focal airspace opacities in the bilateral lower lung zones favored to represent sequela of developing pulmonary edema as opposed to a developing infectious process. Attention on follow-up examinations is recommended. Electronically Signed   By: Constance Holster M.D.   On: 06/12/2019 19:38    Review of Systems  Constitutional: Negative.   HENT: Positive for congestion.   Eyes: Negative.   Respiratory: Positive for shortness of breath.   Cardiovascular: Positive for palpitations, orthopnea, leg swelling and PND.  Gastrointestinal: Negative.   Genitourinary: Negative.   Musculoskeletal: Negative.   Skin: Negative.   Neurological: Positive for weakness.  Endo/Heme/Allergies: Negative.   Psychiatric/Behavioral: Negative.    Blood pressure 99/78, pulse 72, temperature 98.3 F  (36.8 C), temperature source Oral, resp. rate 19, height 6' 1.5" (1.867 m), weight 98.9 kg, SpO2 92 %. Physical Exam  Nursing note and vitals reviewed. Constitutional: He is oriented to person, place, and time. He appears well-developed and well-nourished.  HENT:  Head: Normocephalic and atraumatic.  Eyes: Pupils are equal, round, and reactive to light. Conjunctivae and EOM are normal.  Neck: Normal range of motion. Neck supple.  Cardiovascular: Normal rate and regular rhythm. Exam reveals gallop.  Murmur heard. Respiratory: He has decreased breath sounds. He has rhonchi.  GI: Soft. Bowel sounds are normal.  Musculoskeletal:        General: Edema present.  Neurological: He is alert and oriented to person, place, and time. He has normal reflexes.  Skin: Skin is warm and dry.  Psychiatric: He has a normal mood and affect.    Assessment/Plan: Acute on chronic systolic congestive heart failure Severe nonischemic cardiomyopathy Dyspnea Moderate to severe COPD Hypertension Chronic renal insufficiency stage III Hyperlipidemia Peripheral edema [place] ischemia Nonsustained ventricular tachycardia with PVCs TIA . Plan Agree with admit to telemetry  follow-up EKGs and troponins AICD in place consider interrogation Follow-up with nephrology for renal insufficiency Agree with diuretic therapy for heart failure Continue inhalers for COPD asthma Continue  lipid management of hyperlipidemia Agree with conservative medical therapy for heart failure Have the patient follow-up with heart failure center at [name] 06/13/2019, 4:28 PM

## 2019-06-13 NOTE — Plan of Care (Signed)
  Problem: Education: Goal: Knowledge of General Education information will improve Description: Including pain rating scale, medication(s)/side effects and non-pharmacologic comfort measures Outcome: Progressing   Problem: Clinical Measurements: Goal: Respiratory complications will improve Outcome: Progressing   

## 2019-06-13 NOTE — Consult Note (Signed)
PHARMACY CONSULT NOTE - FOLLOW UP  Pharmacy Consult for Electrolyte Monitoring and Replacement   Recent Labs: Potassium (mmol/L)  Date Value  06/13/2019 3.8   Magnesium (mg/dL)  Date Value  05/03/2019 2.3   Calcium (mg/dL)  Date Value  06/13/2019 9.2   Albumin (g/dL)  Date Value  06/12/2019 3.5  03/17/2019 4.3   Phosphorus (mg/dL)  Date Value  05/03/2019 4.1   Sodium (mmol/L)  Date Value  06/13/2019 137  05/15/2019 137     Assessment: Patient presents to the ED with a complaint of shortness of breath and chest discomfort.  He also states he has had some abdominal swelling and recent increase in weight of about 8 pounds.  He has a known history of heart failure and has been in our facility somewhat frequently lately. Started on furosemide 20 mg IV q12H. On spironolactone 12.5 mg daily. Pt ordered KCl 20 mEq.   Goal of Therapy:  K+ 4-5, Mg 2- 2.4   Plan:  MD ordered KCl 20 mEq. Will follow up with AM labs and replace as necessary.   Oswald Hillock ,PharmD, BCPS Clinical Pharmacist 06/13/2019 11:32 AM

## 2019-06-13 NOTE — Progress Notes (Signed)
Talked to Dr. Margaretmary Eddy about patient's BP at 103/86 has scheduled coreg, ok to give. Also hold parameter given for coreg to hold if SBP is less than 100 and to hold IV lasix if SBP is less than 110. No other concern at the moment. RN will continue to monitor.

## 2019-06-13 NOTE — Progress Notes (Addendum)
[facility] [place] NAME: Stephen [name]    MR#:  [id number]  DATE OF BIRTH:  1966/06/07  SUBJECTIVE:  CHIEF COMPLAINT: Patient shortness of breath is better.  Endorses that he has to follow-up with [facility] outpatient getting his heart transplant appointment  REVIEW OF SYSTEMS:  CONSTITUTIONAL: No fever, fatigue or weakness.  EYES: No blurred or double vision.  EARS, NOSE, AND THROAT: No tinnitus or ear pain.  RESPIRATORY: No cough, improving shortness of breath, denies wheezing or hemoptysis.  CARDIOVASCULAR: No chest pain, orthopnea, edema.  GASTROINTESTINAL: No nausea, vomiting, diarrhea or abdominal pain.  GENITOURINARY: No dysuria, hematuria.  ENDOCRINE: No polyuria, nocturia,  HEMATOLOGY: No anemia, easy bruising or bleeding SKIN: No rash or lesion. MUSCULOSKELETAL: No joint pain or arthritis.   NEUROLOGIC: No tingling, numbness, weakness.  PSYCHIATRY: No anxiety or depression.   DRUG ALLERGIES:   Allergies  Allergen Reactions  . Lisinopril Cough    VITALS:  Blood pressure 99/78, pulse 72, temperature 98.3 F (36.8 C), temperature source Oral, resp. rate (!) 21, height 6' 1.5" (1.867 m), weight 98.9 kg, SpO2 92 %.  PHYSICAL EXAMINATION:  GENERAL:  53 y.o.-year-old patient lying in the bed with no acute distress.  EYES: Pupils equal, round, reactive to light and accommodation. No scleral icterus. Extraocular muscles intact.  HEENT: Head atraumatic, normocephalic. Oropharynx and nasopharynx clear.  NECK:  Supple, no jugular venous distention. No thyroid enlargement, no tenderness.  LUNGS: Normal breath sounds bilaterally, no wheezing, rales,rhonchi or crepitation. No use of accessory muscles of respiration.  CARDIOVASCULAR: S1, S2 normal. No murmurs, rubs, or gallops.  ABDOMEN: Soft, nontender, nondistended. Bowel sounds present. EXTREMITIES: No pedal edema, cyanosis, or clubbing.  NEUROLOGIC: Cranial nerves II through  XII are intact. Muscle strength at his baseline in all extremities. Sensation intact. Gait not checked.  PSYCHIATRIC: The patient is alert and oriented x 3.  SKIN: No obvious rash, lesion, or ulcer.    LABORATORY PANEL:   CBC Recent Labs  Lab 06/13/19 0050  WBC 7.5  HGB 13.4  HCT 41.4  PLT 337   ------------------------------------------------------------------------------------------------------------------  Chemistries  Recent Labs  Lab 06/12/19 1818 06/13/19 0050 06/13/19 0641  NA 134* 137  --   K 4.8 3.8  --   CL 103 102  --   CO2 19* 25  --   GLUCOSE 172* 151*  --   BUN 27* 28*  --   CREATININE 1.91* 1.99*  --   CALCIUM 9.4 9.2  --   MG  --   --  2.2  AST 14*  --   --   ALT 14  --   --   ALKPHOS 104  --   --   BILITOT 0.7  --   --    ------------------------------------------------------------------------------------------------------------------  Cardiac Enzymes Recent Labs  Lab 06/13/19 0641  TROPONINI 0.12*   ------------------------------------------------------------------------------------------------------------------  RADIOLOGY:  Dg Chest 2 View  Result Date: 06/12/2019 CLINICAL DATA:  Chest pain EXAM: CHEST - 2 VIEW COMPARISON:  05/30/2019 FINDINGS: The heart size is enlarged. There is volume overload with findings of developing pulmonary edema. There are more focal areas of increased attenuation in the retrocardiac region and at the right lung base. No pneumothorax. A left-sided ICD is noted. There is no acute osseous abnormality. IMPRESSION: 1. Cardiomegaly with developing pulmonary edema. 2. More focal airspace opacities in the bilateral lower lung zones favored to represent sequela of developing pulmonary edema as opposed to a developing  infectious process. Attention on follow-up examinations is recommended. Electronically Signed   By: Constance Holster M.D.   On: 06/12/2019 19:38    EKG:   Orders placed or performed during the hospital  encounter of 06/12/19  . EKG 12-Lead  . EKG 12-Lead  . ED EKG  . ED EKG    ASSESSMENT AND PLAN:    # Acute on chronic systolic congestive heart failure Monitor patient on telemetry Clinically improving IV diuretics Lasix 20 mg every 12 Troponin 0 0.07-0.10-0.12 Seen by cardiology [name] Daily weights, intake and output Continue home medication aspirin 81 mg, Plavix, Coreg, statin Continue amiodarone 200 mg 2 times a day Goal is to keep his potassium at 4 and magnesium at 2.0   #COPD chronic no exacerbation Continue bronchodilators  #Essential hypertension  #Chronic kidney disease stage III-at baseline continue close monitoring of renal function and avoid nephrotoxins   #Hyperlipidemia continue home dose statin   All the records are reviewed and case discussed with Care Management/Social Workerr. Management plans discussed with the patient, he is  in agreement.  CODE STATUS: fc   TOTAL TIME TAKING CARE OF THIS PATIENT: 39  minutes.   POSSIBLE D/C IN 1-2 DAYS, DEPENDING ON CLINICAL CONDITION.  Note: This dictation was prepared with Dragon dictation along with smaller phrase technology. Any transcriptional errors that result from this process are unintentional.   [name] M.D on 06/13/2019 at 2:31 PM  Between 7am to 6pm - Pager - [phone] After 6pm go to [email or web address] - password [email or web address] [facility]  Office  [phone]  CC: Primary care physician; Valerie Roys, DO

## 2019-06-13 NOTE — Progress Notes (Signed)
Pt was admitted on the floor with no signs of distress. Call bell and telephone is within reach of pt. Pt was educated about safety and the floor. Pt refused to have the Heart Failure booklet and states " I have a bunch of it at home already". Will continue to monitor.

## 2019-06-13 NOTE — Progress Notes (Signed)
Per dr. Margaretmary Eddy hold scheduled dose of lasix due to low bp. Okay to give scheduled aldactone 12.5mg  po . Will continue to monitor

## 2019-06-14 ENCOUNTER — Inpatient Hospital Stay: Payer: PRIVATE HEALTH INSURANCE

## 2019-06-14 LAB — BASIC METABOLIC PANEL
Anion gap: 13 (ref 5–15)
BUN: 32 mg/dL — ABNORMAL HIGH (ref 6–20)
CO2: 22 mmol/L (ref 22–32)
Calcium: 9.5 mg/dL (ref 8.9–10.3)
Chloride: 105 mmol/L (ref 98–111)
Creatinine, Ser: 1.8 mg/dL — ABNORMAL HIGH (ref 0.61–1.24)
GFR calc Af Amer: 48 mL/min — ABNORMAL LOW (ref >60)
GFR calc non Af Amer: 42 mL/min — ABNORMAL LOW (ref >60)
Glucose, Bld: 112 mg/dL — ABNORMAL HIGH (ref 70–99)
Potassium: 3.6 mmol/L (ref 3.5–5.1)
Sodium: 140 mmol/L (ref 135–145)

## 2019-06-14 LAB — MAGNESIUM: Magnesium: 2.3 mg/dL (ref 1.7–2.4)

## 2019-06-14 MED ORDER — ENSURE ENLIVE PO LIQD
237.0000 mL | Freq: Two times a day (BID) | ORAL | Status: DC
  Administered 2019-06-15 – 2019-06-23 (×4): 237 mL via ORAL

## 2019-06-14 MED ORDER — POTASSIUM CHLORIDE 20 MEQ PO PACK
40.0000 meq | PACK | Freq: Two times a day (BID) | ORAL | Status: AC
  Administered 2019-06-14 (×2): 40 meq via ORAL
  Filled 2019-06-14 (×2): qty 2

## 2019-06-14 MED ORDER — ADULT MULTIVITAMIN W/MINERALS CH
1.0000 | ORAL_TABLET | Freq: Every day | ORAL | Status: DC
  Administered 2019-06-15 – 2019-06-24 (×9): 1 via ORAL
  Filled 2019-06-14 (×10): qty 1

## 2019-06-14 NOTE — Plan of Care (Signed)
  Problem: Education: Goal: Knowledge of General Education information will improve Description: Including pain rating scale, medication(s)/side effects and non-pharmacologic comfort measures Outcome: Progressing   Problem: Clinical Measurements: Goal: Respiratory complications will improve Outcome: Progressing   

## 2019-06-14 NOTE — Progress Notes (Addendum)
[facility] [place] NAME: Stephen [name]    MR#:  [id number]  DATE OF BIRTH:  06-19-1966  SUBJECTIVE:  CHIEF COMPLAINT: Patient is reporting nausea and vomiting throughout the night but shortness of breath is better.  Denies any abdominal pain still tolerating diet fine wants to continue eating some ,endorses that he has to follow-up with [facility] outpatient getting his heart transplant appointment  REVIEW OF SYSTEMS:  CONSTITUTIONAL: No fever, fatigue or weakness.  EYES: No blurred or double vision.  EARS, NOSE, AND THROAT: No tinnitus or ear pain.  RESPIRATORY: No cough, improving shortness of breath, denies wheezing or hemoptysis.  CARDIOVASCULAR: No chest pain, orthopnea, edema.  GASTROINTESTINAL: Reporting nausea, vomiting, denies diarrhea or abdominal pain.  GENITOURINARY: No dysuria, hematuria.  ENDOCRINE: No polyuria, nocturia,  HEMATOLOGY: No anemia, easy bruising or bleeding SKIN: No rash or lesion. MUSCULOSKELETAL: No joint pain or arthritis.   NEUROLOGIC: No tingling, numbness, weakness.  PSYCHIATRY: No anxiety or depression.   DRUG ALLERGIES:   Allergies  Allergen Reactions  . Lisinopril Cough    VITALS:  Blood pressure 110/88, pulse 83, temperature 98.5 F (36.9 C), temperature source Oral, resp. rate 18, height 6' 1.5" (1.867 m), weight 98.1 kg, SpO2 90 %.  PHYSICAL EXAMINATION:  GENERAL:  53 y.o.-year-old patient lying in the bed with no acute distress.  EYES: Pupils equal, round, reactive to light and accommodation. No scleral icterus. Extraocular muscles intact.  HEENT: Head atraumatic, normocephalic. Oropharynx and nasopharynx clear.  NECK:  Supple, no jugular venous distention. No thyroid enlargement, no tenderness.  LUNGS: Normal breath sounds bilaterally, no wheezing, rales,rhonchi or crepitation. No use of accessory muscles of respiration.  CARDIOVASCULAR: S1, S2 normal. No murmurs, rubs, or gallops.   ABDOMEN: Soft, nontender, nondistended. Bowel sounds present. EXTREMITIES: No pedal edema, cyanosis, or clubbing.  NEUROLOGIC: Cranial nerves II through XII are intact. Muscle strength at his baseline in all extremities. Sensation intact. Gait not checked.  PSYCHIATRIC: The patient is alert and oriented x 3.  SKIN: No obvious rash, lesion, or ulcer.    LABORATORY PANEL:   CBC Recent Labs  Lab 06/13/19 0050  WBC 7.5  HGB 13.4  HCT 41.4  PLT 337   ------------------------------------------------------------------------------------------------------------------  Chemistries  Recent Labs  Lab 06/12/19 1818  06/14/19 0447  NA 134*   < > 140  K 4.8   < > 3.6  CL 103   < > 105  CO2 19*   < > 22  GLUCOSE 172*   < > 112*  BUN 27*   < > 32*  CREATININE 1.91*   < > 1.80*  CALCIUM 9.4   < > 9.5  MG  --    < > 2.3  AST 14*  --   --   ALT 14  --   --   ALKPHOS 104  --   --   BILITOT 0.7  --   --    < > = values in this interval not displayed.   ------------------------------------------------------------------------------------------------------------------  Cardiac Enzymes Recent Labs  Lab 06/13/19 0641  TROPONINI 0.12*   ------------------------------------------------------------------------------------------------------------------  RADIOLOGY:  Dg Chest 2 View  Result Date: 06/12/2019 CLINICAL DATA:  Chest pain EXAM: CHEST - 2 VIEW COMPARISON:  05/30/2019 FINDINGS: The heart size is enlarged. There is volume overload with findings of developing pulmonary edema. There are more focal areas of increased attenuation in the retrocardiac region and at the right lung base. No pneumothorax. A  left-sided ICD is noted. There is no acute osseous abnormality. IMPRESSION: 1. Cardiomegaly with developing pulmonary edema. 2. More focal airspace opacities in the bilateral lower lung zones favored to represent sequela of developing pulmonary edema as opposed to a developing infectious  process. Attention on follow-up examinations is recommended. Electronically Signed   By: Constance Holster M.D.   On: 06/12/2019 19:38    EKG:   Orders placed or performed during the hospital encounter of 06/12/19  . EKG 12-Lead  . EKG 12-Lead  . ED EKG  . ED EKG    ASSESSMENT AND PLAN:   #Intractable nausea and vomiting-could be from GERD PPI Check H. pylori breath test Patient is not a good candidate for endoscopy given his cardiac history   # Acute on chronic systolic congestive heart failure with history of cardiomyopathy- nonischemic, ejection fraction around 25% with AICD Monitor patient on telemetry Clinically improving IV diuretics Lasix 20 mg every 12 hrs will change to p.o. Lasix once patient's nausea vomiting is resolved Troponin 0 0.07-0.10-0.12 Seen by cardiology [name].  Appreciate his recommendations.  Considering AICD interrogation Daily weights, intake and output Continue home medication aspirin 81 mg, Plavix, Coreg, statin Continue amiodarone 200 mg 2 times a day Goal is to keep his potassium at 4 and magnesium at 2.0 On repeat chest x-ray   #COPD chronic no exacerbation Continue bronchodilators  #Essential hypertension  #Chronic kidney disease stage III-at baseline continue close monitoring of renal function and avoid nephrotoxins  Baseline creatinine 1.9  #Hyperlipidemia continue home dose statin   All the records are reviewed and case discussed with Care Management/Social Workerr. Management plans discussed with the patient, he is  in agreement.  CODE STATUS: fc   TOTAL TIME TAKING CARE OF THIS PATIENT: 36  minutes.   POSSIBLE D/C IN 1-2 DAYS, DEPENDING ON CLINICAL CONDITION.  Note: This dictation was prepared with Dragon dictation along with smaller phrase technology. Any transcriptional errors that result from this process are unintentional.   Stephen [name] M.D on 06/14/2019 at 12:28 PM  Between 7am to 6pm - Pager -  [phone] After 6pm go to [email or web address] - password [email or web address] [facility]  Office  [phone]  CC: Primary care physician; Valerie Roys, DO

## 2019-06-14 NOTE — Progress Notes (Signed)
Subjective:  Complains of worsening dyspnea today shortness of breath no pain no leg edema  Objective:  Vital Signs in the last 24 hours: Temp:  [97.8 F (36.6 C)-98.7 F (37.1 C)] 98.5 F (36.9 C) (06/14 0810) Pulse Rate:  [76-89] 83 (06/14 0810) Resp:  [18-20] 18 (06/14 0810) BP: (103-119)/(86-94) 110/88 (06/14 0810) SpO2:  [90 %-98 %] 90 % (06/14 0810) Weight:  [98.1 kg] 98.1 kg (06/14 0440)  Intake/Output from previous day: 06/13 0701 - 06/14 0700 In: 540 [P.O.:540] Out: 500 [Urine:500] Intake/Output from this shift: No intake/output data recorded.  Physical Exam: General appearance: appears stated age Neck: no adenopathy, no carotid bruit, no JVD, supple, symmetrical, trachea midline and thyroid not enlarged, symmetric, no tenderness/mass/nodules Lungs: diminished breath sounds bibasilar and bilaterally Heart: regular rate and rhythm, S1, S2 normal, no murmur, click, rub or gallop Abdomen: soft, non-tender; bowel sounds normal; no masses,  no organomegaly Extremities: edema 2 Pulses: 2+ and symmetric Skin: Skin color, texture, turgor normal. No rashes or lesions Neurologic: Alert and oriented X 3, normal strength and tone. Normal symmetric reflexes. Normal coordination and gait  Lab Results: Recent Labs    06/12/19 1818 06/13/19 0050  WBC 8.1 7.5  HGB 14.3 13.4  PLT 384 337   Recent Labs    06/13/19 0050 06/14/19 0447  NA 137 140  K 3.8 3.6  CL 102 105  CO2 25 22  GLUCOSE 151* 112*  BUN 28* 32*  CREATININE 1.99* 1.80*   Recent Labs    06/13/19 0050 06/13/19 0641  TROPONINI 0.10* 0.12*   Hepatic Function Panel Recent Labs    06/12/19 1818  PROT 7.5  ALBUMIN 3.5  AST 14*  ALT 14  ALKPHOS 104  BILITOT 0.7  BILIDIR 0.2  IBILI 0.5   No results for input(s): CHOL in the last 72 hours. No results for input(s): PROTIME in the last 72 hours.  Imaging: Imaging results have been reviewed  Cardiac Studies:  Assessment/Plan:   Arrhythmia Cardiomyopathy CHF Edema Palpitations Shortness of Breath  COPD Hypertension Chronic renal insufficiency III . Plan Continue telemetry Agree with diuretic therapy for heart failure symptoms Continue supplemental oxygen therapy .  Inhalers for COPD therapy Continue lipid management Refer the patient to  transplant center at [facility]    LOS: 2 days    Stephen [name] [place] 06/14/2019, 1:01 PM

## 2019-06-14 NOTE — Progress Notes (Addendum)
Pt potassium at 3.6 but the goal is for pt to have his potassium at 4.0. Notify prime. Will continue to monitor.  Update [phone]: Dr. Marcille Blanco state swill place order. Will continue to monitor.

## 2019-06-14 NOTE — Progress Notes (Signed)
Initial Nutrition Assessment  RD working remotely.  DOCUMENTATION CODES:   Not applicable  INTERVENTION:  Provide Ensure Enlive po BID, each supplement provides 350 kcal and 20 grams of protein. Patient prefers strawberry.  Provide daily MVI.  NUTRITION DIAGNOSIS:   Inadequate oral intake related to decreased appetite, nausea, vomiting as evidenced by per patient/family report.  GOAL:   Patient will meet greater than or equal to 90% of their needs  MONITOR:   PO intake, Supplement acceptance, Labs, Weight trends, I & O's  REASON FOR ASSESSMENT:   Malnutrition Screening Tool, Consult Diet education  ASSESSMENT:   53 year old male with PMHx of HTN, hx TIA, CHF, s/p placement of AICD, COPD, CKD III, arthritis, gout admitted with intractable nausea and vomiting, acute on chronic systolic CHF.   Spoke with patient over the phone to obtain nutrition/weight history. He reports he has had N/V that developed yesterday. He has a poor appetite. He was able to keep down some fruit at breakfast and a few bites of mashed potatoes at lunch. He reports even before development of N/V his appetite has been decreased for a while PTA. His doctor had him start drinking Ensure every day. He drinks a strawberry Ensure each evening around 8pm. He is amenable to increasing to 2 per day here since his appetite is very poor. At home he may have salad or meat with vegetables. He is very familiar with low-sodium diet for CHF and reports he has been following it for 3 years. He was able to describe components of diet to RD. No questions on diet at this time, so patient has no education needs at this time. Main priority is help him meet calorie/protein needs in setting of poor appetite/N/V.  Patient reports his UBW was 235 lbs but he has not weighed that for a while now. He reports he is now pretty stable at around 214 lbs. Currently 98.1 kg (216.3 lbs). Patient reports he feels he has lost a lot of muscle mass  in his arms.  Medications reviewed and include: amiodarone, carvedilol, Lasix 20 mg Q12hrs IV, pantoprazole, potassium chloride 40 mEq BID, senna-docusate 1 tablet daily, spironolactone 12.5 mg daily.  Labs reviewed: BUN 32, Creatinine 1.8.  NUTRITION - FOCUSED PHYSICAL EXAM:  Unable to complete at this time.  Diet Order:   Diet Order            Diet Heart Room service appropriate? Yes; Fluid consistency: Thin  Diet effective now             EDUCATION NEEDS:   No education needs have been identified at this time  Skin:  Skin Assessment: Reviewed RN Assessment  Last BM:  06/13/2019 per chart  Height:   Ht Readings from Last 1 Encounters:  06/12/19 6' 1.5" (1.867 m)   Weight:   Wt Readings from Last 1 Encounters:  06/14/19 98.1 kg   Ideal Body Weight:  85 kg  BMI:  Body mass index is 28.15 kg/m.  Estimated Nutritional Needs:   Kcal:  1655-3748  Protein:  115-125 grams  Fluid:  2 L/day  Willey Blade, MS, RD, LDN Office: [phone] Pager: [phone] After Hours/Weekend Pager: [phone]

## 2019-06-14 NOTE — Consult Note (Addendum)
PHARMACY CONSULT NOTE - FOLLOW UP  Pharmacy Consult for Electrolyte Monitoring and Replacement   Recent Labs: Potassium (mmol/L)  Date Value  06/14/2019 3.6   Magnesium (mg/dL)  Date Value  06/14/2019 2.3   Calcium (mg/dL)  Date Value  06/14/2019 9.5   Albumin (g/dL)  Date Value  06/12/2019 3.5  03/17/2019 4.3   Phosphorus (mg/dL)  Date Value  05/03/2019 4.1   Sodium (mmol/L)  Date Value  06/14/2019 140  05/15/2019 137     Assessment: Patient presents to the ED with a complaint of shortness of breath and chest discomfort.  He also states he has had some abdominal swelling and recent increase in weight of about 8 pounds.  He has a known history of heart failure and has been in our facility somewhat frequently lately. Started on furosemide 20 mg IV q12H. On spironolactone 12.5 mg daily.   Goal of Therapy:  K+ 4-5, Mg 2- 2.4   Plan:  MD ordered KCl 40 mEq x 2 PO. Will follow up with AM labs and replace as necessary.   Oswald Hillock ,PharmD, BCPS Clinical Pharmacist 06/14/2019 9:36 AM

## 2019-06-15 LAB — HEMOGLOBIN AND HEMATOCRIT, BLOOD
HCT: 42.1 % (ref 39.0–52.0)
Hemoglobin: 13.5 g/dL (ref 13.0–17.0)

## 2019-06-15 LAB — BASIC METABOLIC PANEL
Anion gap: 12 (ref 5–15)
BUN: 36 mg/dL — ABNORMAL HIGH (ref 6–20)
CO2: 21 mmol/L — ABNORMAL LOW (ref 22–32)
Calcium: 9.7 mg/dL (ref 8.9–10.3)
Chloride: 107 mmol/L (ref 98–111)
Creatinine, Ser: 2.28 mg/dL — ABNORMAL HIGH (ref 0.61–1.24)
GFR calc Af Amer: 36 mL/min — ABNORMAL LOW (ref >60)
GFR calc non Af Amer: 31 mL/min — ABNORMAL LOW (ref >60)
Glucose, Bld: 142 mg/dL — ABNORMAL HIGH (ref 70–99)
Potassium: 5.2 mmol/L — ABNORMAL HIGH (ref 3.5–5.1)
Sodium: 140 mmol/L (ref 135–145)

## 2019-06-15 LAB — GLUCOSE, CAPILLARY: Glucose-Capillary: 180 mg/dL — ABNORMAL HIGH (ref 70–99)

## 2019-06-15 LAB — POTASSIUM: Potassium: 5.2 mmol/L — ABNORMAL HIGH (ref 3.5–5.1)

## 2019-06-15 MED ORDER — PROMETHAZINE HCL 25 MG/ML IJ SOLN
12.5000 mg | Freq: Four times a day (QID) | INTRAMUSCULAR | Status: DC | PRN
  Administered 2019-06-15: 12.5 mg via INTRAVENOUS
  Filled 2019-06-15: qty 1

## 2019-06-15 MED ORDER — SODIUM CHLORIDE 0.9% FLUSH
3.0000 mL | Freq: Two times a day (BID) | INTRAVENOUS | Status: DC
  Administered 2019-06-15 – 2019-06-20 (×13): 3 mL via INTRAVENOUS

## 2019-06-15 MED ORDER — SODIUM CHLORIDE 0.9 % IV SOLN
INTRAVENOUS | Status: DC | PRN
  Administered 2019-06-15: 250 mL via INTRAVENOUS
  Administered 2019-06-17: 20 mL via INTRAVENOUS
  Administered 2019-06-18: 250 mL via INTRAVENOUS
  Administered 2019-06-20: 500 mL via INTRAVENOUS

## 2019-06-15 MED ORDER — ENOXAPARIN SODIUM 40 MG/0.4ML ~~LOC~~ SOLN
40.0000 mg | SUBCUTANEOUS | Status: DC
  Administered 2019-06-15: 40 mg via SUBCUTANEOUS
  Filled 2019-06-15: qty 0.4

## 2019-06-15 NOTE — Consult Note (Signed)
PHARMACY CONSULT NOTE - FOLLOW UP  Pharmacy Consult for Electrolyte Monitoring and Replacement   Recent Labs: Potassium (mmol/L)  Date Value  06/15/2019 5.2 (H)   Magnesium (mg/dL)  Date Value  06/14/2019 2.3   Calcium (mg/dL)  Date Value  06/15/2019 9.7   Albumin (g/dL)  Date Value  06/12/2019 3.5  03/17/2019 4.3   Phosphorus (mg/dL)  Date Value  05/03/2019 4.1   Sodium (mmol/L)  Date Value  06/15/2019 140  05/15/2019 137     Assessment: Patient presents to the ED with a complaint of shortness of breath and chest discomfort.  He also states he has had some abdominal swelling and recent increase in weight of about 8 pounds.  He has a known history of heart failure and has been in our facility somewhat frequently lately. Started on furosemide 20 mg IV q12H. On spironolactone 12.5 mg daily.   Goal of Therapy:  K+ 4-5, Mg 2- 2.4   Plan:  Will recommend holding spironolactone. K+ 5.2 pt received 40 mEq x 2 yesterday per MD orders.  Will follow up with AM labs and replace as necessary.   Oswald Hillock ,PharmD, BCPS Clinical Pharmacist 06/15/2019 9:20 AM

## 2019-06-15 NOTE — Progress Notes (Signed)
PHARMACIST - PHYSICIAN COMMUNICATION  CONCERNING:  Enoxaparin (Lovenox) for DVT Prophylaxis    RECOMMENDATION: Patient was prescribed enoxaprin 30mg  q24 hours for VTE prophylaxis.   Filed Weights   06/13/19 0300 06/14/19 0440 06/15/19 0202  Weight: 218 lb 0.6 oz (98.9 kg) 216 lb 4.8 oz (98.1 kg) 215 lb 12.8 oz (97.9 kg)    Body mass index is 28.09 kg/m.  Estimated Creatinine Clearance: 46 mL/min (A) (by C-G formula based on SCr of 2.28 mg/dL (H)).   Patient is candidate for enoxaparin 40mg  every 24 hours based on CrCl >52ml/min AND Weight > 50kg for male  DESCRIPTION: Pharmacy has adjusted enoxaparin dose per [facility] policy.  Patient is now receiving enoxaparin 40mg  every 24 hours.   Lu Duffel, PharmD, BCPS Clinical Pharmacist 06/15/2019 4:13 PM

## 2019-06-15 NOTE — Progress Notes (Signed)
Cardiac Rehab Navigator/ Exercise Physiologist Note  CHF Education:? ? Educational session with patient completed.  Patient reports that he has been been taught this education, seen the videos, and has received packet with prior admissions. Reviewed contents of "Living Better with Heart Failure" packet with patient. Briefly reviewed definition of heart failure and signs and symptoms of an exacerbation. Discussed potential causes of CHF.  Explained to patient that HF is a chronic illness which requires self-assessment / self-management along with help from the cardiologist/PCP. Discussed definition of EF measurement along with normal value compared to patient's EF of 10%. ? *Reviewed importance of and reason behind checking weight daily in the AM, after using the bathroom, but before getting dressed. Patient reports weighing himself daily and recording.  *Reviewed with patient the following information: *Discussed when to call the Dr= weight gain of >2-3lb overnight of 5lb in a week,  *Discussed yellow zone= call MD: weight gain of >2-3lb overnight of 5lb in a week, increased swelling, increased SOB when lying down, chest discomfort, dizziness, increased fatigue *Red Zone= call 911: struggle to breath, fainting or near fainting, significant chest pain ?  *Heart Failure Zone Magnet given and reviewed with patient.   ? *Diet - Patient currently ordered heart healthy. Instructed patient to follow a low sodium diet of 2000 mg or less.  Recommended foods for low sodium heart healthy nutrition or heart failure nutrition therapy discussed. Reviewed with patient steps to reading a food label with close attention to serving size and mg of sodium.   ? *Discussed fluid intake with patient as well. Patient not currently on a fluid restriction, but advised no more than 64 ounces of fluid per day. Demonstrated this volume to patient using the bedside water pitcher.   ? *Instructed patient to take medications  as prescribed for heart failure. Explained briefly why patient is on the medications (either make you feel better, live longer or keep you out of the hospital) and discussed monitoring and side effects.  ? *Discussed exercise / activity. Patient is active when he is not having an exacerbation. Encouraged patient to be as active as possible. Patient has graduated from Cardiac Rehab program before. Patient would benefit from the Virtual Rehab program. This EP recommends participation to build strength before possible heart transplant surgery. Patient has heart transplant work up appointment at [facility] this coming Friday. Patient is looking forward to this appointment.   ? *Smoking Cessation- Patient is a FORMER smoker.   *[facility] - Explained the purpose of the HF Clinic. Explained to patient the HF Clinic does not replace PCP nor Cardiologist, but is an additional resource to helping patient manage heart failure at home. Patient is an established patient at the HF Clinic but does not have an appointment scheduled at this time. This EP will consult [name] about appointment status.  ? Again, the 5 Steps to Living Better with Heart Failure were reviewed with patient.   Patient attributes this hospital admission to drinking too much water from the increase in temperature outside. Patient reports that if he doesn't drink enough that he is dehydrated and that if he drinks too much, he is overloaded, and that he is in between a rock and a hard place with this. This EP will consult with patient's Dr about Fluid restriction recommendations.  Patient thanked me for providing the above information. ?  [name], [facility]  Exercise Physiologist Department Phone #: [phone]  Fax: [phone]  Direct Line [phone] Email Address: [email or web address]

## 2019-06-15 NOTE — Progress Notes (Signed)
[facility]  ROUNDING NOTE   Subjective:   [name] admitted to [facility] on 06/12/2019 for Shortness of breath [R06.02] Acute pulmonary edema ([place]) [J81.0] Failure of outpatient treatment [Z78.9] Acute on chronic congestive heart failure, unspecified heart failure type [facility]) [I50.9]  Nephrology consulted for chronic kidney disease  Objective:  Vital signs in last 24 hours:  Temp:  [98.3 F (36.8 C)-99 F (37.2 C)] 98.3 F (36.8 C) (06/15 1620) Pulse Rate:  [78-97] 82 (06/15 1620) Resp:  [19-20] 19 (06/15 1620) BP: (104-111)/(83-92) 104/83 (06/15 1620) SpO2:  [90 %-96 %] 90 % (06/15 1620) Weight:  [97.9 kg] 97.9 kg (06/15 0202)  Weight change: -0.227 kg Filed Weights   06/13/19 0300 06/14/19 0440 06/15/19 0202  Weight: 98.9 kg 98.1 kg 97.9 kg    Intake/Output: I/O last 3 completed shifts: In: 360 [P.O.:360] Out: 1350 [Urine:1350]   Intake/Output this shift:  Total I/O In: 360 [P.O.:360] Out: -   Physical Exam: General: NAD,   Head: Normocephalic, atraumatic. Moist oral mucosal membranes  Eyes: Anicteric, PERRL  Neck: Supple, trachea midline  Lungs:  Clear to auscultation  Heart: Regular rate and rhythm  Abdomen:  Soft, nontender,   Extremities: no peripheral edema.  Neurologic: Nonfocal, moving all four extremities  Skin: No lesions       Basic Metabolic Panel: Recent Labs  Lab 06/12/19 1818 06/13/19 0050 06/13/19 0641 06/14/19 0447 06/15/19 0503 06/15/19 0504  NA 134* 137  --  140  --  140  K 4.8 3.8  --  3.6 5.2* 5.2*  CL 103 102  --  105  --  107  CO2 19* 25  --  22  --  21*  GLUCOSE 172* 151*  --  112*  --  142*  BUN 27* 28*  --  32*  --  36*  CREATININE 1.91* 1.99*  --  1.80*  --  2.28*  CALCIUM 9.4 9.2  --  9.5  --  9.7  MG  --   --  2.2 2.3  --   --     Liver Function Tests: Recent Labs  Lab 06/12/19 1818  AST 14*  ALT 14  ALKPHOS 104  BILITOT 0.7  PROT 7.5  ALBUMIN 3.5   Recent Labs  Lab 06/12/19 1818   LIPASE 20   No results for input(s): AMMONIA in the last 168 hours.  CBC: Recent Labs  Lab 06/12/19 1818 06/13/19 0050  WBC 8.1 7.5  HGB 14.3 13.4  HCT 44.2 41.4  MCV 88.0 87.5  PLT 384 337    Cardiac Enzymes: Recent Labs  Lab 06/12/19 1818 06/13/19 0050 06/13/19 0641  TROPONINI 0.07* 0.10* 0.12*    BNP: Invalid input(s): POCBNP  CBG: Recent Labs  Lab 06/15/19 0237  GLUCAP 180*    Microbiology: Results for orders placed or performed during the hospital encounter of 06/12/19  SARS Coronavirus 2 (CEPHEID- Performed in [place] hospital lab), Hosp Order     Status: None   Collection Time: 06/12/19  9:52 PM   Specimen: Nasopharyngeal Swab  Result Value Ref Range Status   SARS Coronavirus 2 NEGATIVE NEGATIVE Final    Comment: (NOTE) If result is NEGATIVE SARS-CoV-2 target nucleic acids are NOT DETECTED. The SARS-CoV-2 RNA is generally detectable in upper and lower  respiratory specimens during the acute phase of infection. The lowest  concentration of SARS-CoV-2 viral copies this assay can detect is 250  copies / mL. A negative result does not  preclude SARS-CoV-2 infection  and should not be used as the sole basis for treatment or other  patient management decisions.  A negative result may occur with  improper specimen collection / handling, submission of specimen other  than nasopharyngeal swab, presence of viral mutation(s) within the  areas targeted by this assay, and inadequate number of viral copies  (<250 copies / mL). A negative result must be combined with clinical  observations, patient history, and epidemiological information. If result is POSITIVE SARS-CoV-2 target nucleic acids are DETECTED. The SARS-CoV-2 RNA is generally detectable in upper and lower  respiratory specimens dur ing the acute phase of infection.  Positive  results are indicative of active infection with SARS-CoV-2.  Clinical  correlation with patient history and other  diagnostic information is  necessary to determine patient infection status.  Positive results do  not rule out bacterial infection or co-infection with other viruses. If result is PRESUMPTIVE POSTIVE SARS-CoV-2 nucleic acids MAY BE PRESENT.   A presumptive positive result was obtained on the submitted specimen  and confirmed on repeat testing.  While 2019 novel coronavirus  (SARS-CoV-2) nucleic acids may be present in the submitted sample  additional confirmatory testing may be necessary for epidemiological  and / or clinical management purposes  to differentiate between  SARS-CoV-2 and other Sarbecovirus currently known to infect humans.  If clinically indicated additional testing with an alternate test  methodology [phone]) is advised. The SARS-CoV-2 RNA is generally  detectable in upper and lower respiratory sp ecimens during the acute  phase of infection. The expected result is Negative. Fact Sheet for Patients:  StrictlyIdeas.no Fact Sheet for Healthcare Providers: BankingDealers.co.za This test is not yet approved or cleared by the [place] FDA and has been authorized for detection and/or diagnosis of SARS-CoV-2 by FDA under an Emergency Use Authorization (EUA).  This EUA will remain in effect (meaning this test can be used) for the duration of the COVID-19 declaration under Section 564(b)(1) of the Act, 21 U.S.C. section 360bbb-3(b)(1), unless the authorization is terminated or revoked sooner. Performed at [facility], [address] [place], [place] [address]     Coagulation Studies: Recent Labs    06/12/19 1818  LABPROT 13.9  INR 1.1    Urinalysis: No results for input(s): COLORURINE, LABSPEC, PHURINE, GLUCOSEU, HGBUR, BILIRUBINUR, KETONESUR, PROTEINUR, UROBILINOGEN, NITRITE, LEUKOCYTESUR in the last 72 hours.  Invalid input(s): APPERANCEUR    Imaging: Dg Abd Acute 2+v W 1v Chest  Result Date:  06/14/2019 CLINICAL DATA:  Shortness of breath. Intractable nausea and vomiting. New right lower abdominal pain. EXAM: DG ABDOMEN ACUTE W/ 1V CHEST COMPARISON:  Two-view chest x-ray 06/12/2019 FINDINGS: Heart is enlarged. Moderate pulmonary vascular congestion is present. There is slight increase in a diffuse interstitial pattern. Bibasilar opacities are present without significant consolidation. The bowel gas pattern is normal. Mild prominence of stool in the ascending colon and rectum. No obstruction is present. Degenerative changes are noted in the lower lumbar spine. IMPRESSION: 1. Stable cardiomegaly with increasing interstitial pattern consistent with worsening edema. 2. Infection versus atelectasis at the lung bases. 3. Normal bowel gas pattern.  No obstruction or free air. Electronically Signed   By: San Morelle M.D.   On: 06/14/2019 15:16     Medications:   . sodium chloride 250 mL (06/15/19 0300)   . amiodarone  200 mg Oral BID  . aspirin EC  81 mg Oral Daily  . atorvastatin  40 mg Oral q1800  . carvedilol  3.125 mg Oral BID WC  . clopidogrel  75 mg Oral Daily  . enoxaparin (LOVENOX) injection  40 mg Subcutaneous Q24H  . feeding supplement (ENSURE ENLIVE)  237 mL Oral BID BM  . methimazole  40 mg Oral Daily  . mometasone-formoterol  2 puff Inhalation BID  . multivitamin with minerals  1 tablet Oral Daily  . pantoprazole  40 mg Oral BID  . polyethylene glycol  17 g Oral Daily  . senna-docusate  1 tablet Oral Daily  . sodium chloride flush  3 mL Intravenous Q12H  . tiotropium  1 capsule Inhalation Daily   sodium chloride, acetaminophen **OR** acetaminophen, guaiFENesin-dextromethorphan, ondansetron **OR** ondansetron (ZOFRAN) IV, promethazine  Assessment/ Plan:  [name] is a 52 y.o. black male withchronic systolic heart failure ejection fraction 10-15%, prior history of cardiac arrest, hyperlipidemia, nephrolithiasis, hypertension, history of TIA, was  admitted to [facility] on[facility]/12/2019  for acute exacerbation of systolic congestive heart failure, EF 10%  1. Acute renal failure: secondary to acute cardiorenal syndrome 2. Chronic kidney disease stage III baseline creatinine 1.85, GFR of 47 on 01/24/19. Bland urine.  3. Acute on Chronic systolic heart failure. 4. Metabolic acidosis 5. Hyperkalemia 6. Hypertension   Plan May resume diuretics if volume status and blood pressure allow.  Suspect GI losses are contributing to rise in creatinine.    LOS: 3 Jeronica Stlouis 6/15/20204:56 PM

## 2019-06-15 NOTE — Progress Notes (Signed)
[facility] [place] NAME: Stephen [name]    MR#:  [id number]  DATE OF BIRTH:  [birth year]  SUBJECTIVE:  CHIEF COMPLAINT: Patient is reporting intractable nausea and vomiting and weakness.  Denies any diarrhea.  Epigastric abdominal pain when he is vomiting  REVIEW OF SYSTEMS:  CONSTITUTIONAL: No fever, fatigue or weakness.  EYES: No blurred or double vision.  EARS, NOSE, AND THROAT: No tinnitus or ear pain.  RESPIRATORY: No cough, improving shortness of breath, denies wheezing or hemoptysis.  CARDIOVASCULAR: No chest pain, orthopnea, edema.  GASTROINTESTINAL: Reporting nausea, vomiting, denies diarrhea or abdominal pain.  GENITOURINARY: No dysuria, hematuria.  ENDOCRINE: No polyuria, nocturia,  HEMATOLOGY: No anemia, easy bruising or bleeding SKIN: No rash or lesion. MUSCULOSKELETAL: No joint pain or arthritis.   NEUROLOGIC: No tingling, numbness, weakness.  PSYCHIATRY: No anxiety or depression.   DRUG ALLERGIES:   Allergies  Allergen Reactions  . Lisinopril Cough    VITALS:  Blood pressure (!) 111/92, pulse 78, temperature 98.5 F (36.9 C), temperature source Oral, resp. rate 20, height 6' 1.5" (1.867 m), weight 97.9 kg, SpO2 90 %.  PHYSICAL EXAMINATION:  GENERAL:  53 y.o.-year-old patient lying in the bed with no acute distress.  EYES: Pupils equal, round, reactive to light and accommodation. No scleral icterus. Extraocular muscles intact.  HEENT: Head atraumatic, normocephalic. Oropharynx and nasopharynx clear.  NECK:  Supple, no jugular venous distention. No thyroid enlargement, no tenderness.  LUNGS: Normal breath sounds bilaterally, no wheezing, rales,rhonchi or crepitation. No use of accessory muscles of respiration.  CARDIOVASCULAR: S1, S2 normal. No murmurs, rubs, or gallops.  ABDOMEN: Soft, nontender, nondistended. Bowel sounds present. EXTREMITIES: No pedal edema, cyanosis, or clubbing.  NEUROLOGIC: Cranial  nerves II through XII are intact. Muscle strength at his baseline in all extremities. Sensation intact. Gait not checked.  PSYCHIATRIC: The patient is alert and oriented x 3.  SKIN: No obvious rash, lesion, or ulcer.    LABORATORY PANEL:   CBC Recent Labs  Lab 06/13/19 0050  WBC 7.5  HGB 13.4  HCT 41.4  PLT 337   ------------------------------------------------------------------------------------------------------------------  Chemistries  Recent Labs  Lab 06/12/19 1818  06/14/19 0447  06/15/19 0504  NA 134*   < > 140  --  140  K 4.8   < > 3.6   < > 5.2*  CL 103   < > 105  --  107  CO2 19*   < > 22  --  21*  GLUCOSE 172*   < > 112*  --  142*  BUN 27*   < > 32*  --  36*  CREATININE 1.91*   < > 1.80*  --  2.28*  CALCIUM 9.4   < > 9.5  --  9.7  MG  --    < > 2.3  --   --   AST 14*  --   --   --   --   ALT 14  --   --   --   --   ALKPHOS 104  --   --   --   --   BILITOT 0.7  --   --   --   --    < > = values in this interval not displayed.   ------------------------------------------------------------------------------------------------------------------  Cardiac Enzymes Recent Labs  Lab 06/13/19 0641  TROPONINI 0.12*   ------------------------------------------------------------------------------------------------------------------  RADIOLOGY:  Dg Abd Acute 2+v W 1v Chest  Result Date: 06/14/2019 CLINICAL DATA:  Shortness of breath. Intractable nausea and vomiting. New right lower abdominal pain. EXAM: DG ABDOMEN ACUTE W/ 1V CHEST COMPARISON:  Two-view chest x-ray 06/12/2019 FINDINGS: Heart is enlarged. Moderate pulmonary vascular congestion is present. There is slight increase in a diffuse interstitial pattern. Bibasilar opacities are present without significant consolidation. The bowel gas pattern is normal. Mild prominence of stool in the ascending colon and rectum. No obstruction is present. Degenerative changes are noted in the lower lumbar spine. IMPRESSION: 1.  Stable cardiomegaly with increasing interstitial pattern consistent with worsening edema. 2. Infection versus atelectasis at the lung bases. 3. Normal bowel gas pattern.  No obstruction or free air. Electronically Signed   By: San Morelle M.D.   On: 06/14/2019 15:16    EKG:   Orders placed or performed during the hospital encounter of 06/12/19  . EKG 12-Lead  . EKG 12-Lead  . ED EKG  . ED EKG    ASSESSMENT AND PLAN:   #Intractable nausea and vomiting-could be from GERD PPI Check H. pylori breath/stool test Patient is not a good candidate for endoscopy given his cardiac history -Per previous records GI consult placed and notified Dr. Marius Ditch she is aware   # Acute on chronic systolic congestive heart failure with history of cardiomyopathy- nonischemic, ejection fraction around 10 percent with AICD Monitor patient on telemetry Clinically improving IV diuretics Lasix 20 mg every 12 hrs changed to p.o. Lasix. which is discontinued in view of intractable nausea and vomiting associated with worsening of renal function  Troponin 0 0.07-0.10-0.12 Seen by cardiology [name].  Appreciate his recommendations.  Considering AICD interrogation Daily weights, intake and output Continue home medication aspirin 81 mg, Plavix, Coreg, statin Continue amiodarone 200 mg 2 times a day Goal is to keep his potassium at 4 and magnesium at 2.0   #COPD chronic no exacerbation Continue bronchodilators  #Essential hypertension  #AK I -chronic kidney disease stage III-at baseline continue close monitoring of renal function and avoid nephrotoxins  Baseline creatinine 1.9 Creatinine at 2.28 today discontinue Lasix Nephrology consult placed-Dr. Juleen China is aware  #Hyperlipidemia continue home dose statin  DVT prophylaxis with Lovenox subcu renal dose adjusted All the records are reviewed and case discussed with Care Management/Social Workerr. Management plans discussed with the patient, he  is  in agreement.  CODE STATUS: fc   TOTAL TIME TAKING CARE OF THIS PATIENT: 36  minutes.   POSSIBLE D/C IN 1-2 DAYS, DEPENDING ON CLINICAL CONDITION.  Note: This dictation was prepared with Dragon dictation along with smaller phrase technology. Any transcriptional errors that result from this process are unintentional.   [name] M.D on 06/15/2019 at 4:06 PM  Between 7am to 6pm - Pager - [phone] After 6pm go to [email or web address] - password [email or web address] [facility]  Office  [phone]  CC: Primary care physician; Valerie Roys, DO

## 2019-06-15 NOTE — Plan of Care (Signed)
  Problem: Health Behavior/Discharge Planning: Goal: Ability to manage health-related needs will improve Outcome: Progressing   Problem: Clinical Measurements: Goal: Ability to maintain clinical measurements within normal limits will improve Outcome: Progressing Goal: Will remain free from infection Outcome: Progressing Goal: Respiratory complications will improve Outcome: Progressing   Problem: Activity: Goal: Risk for activity intolerance will decrease Outcome: Progressing   Problem: Elimination: Goal: Will not experience complications related to urinary retention Outcome: Progressing   Problem: Pain Managment: Goal: General experience of comfort will improve Outcome: Progressing   Problem: Skin Integrity: Goal: Risk for impaired skin integrity will decrease Outcome: Progressing

## 2019-06-15 NOTE — TOC Initial Note (Signed)
Transition of Care [facility]) - Initial/Assessment Note    Patient Details  Name: Stephen [name] MRN: [id number] Date of Birth: 1966-03-09  Transition of Care [facility]) CM/SW Contact:    Elza Rafter, RN Phone Number: 06/15/2019, 2:15 PM  Clinical Narrative:      [name] is well known here on our tele floor.  He has been admitted with CHF exacerbation.  BNP 2963.  Receiving IV Lasix.  He lives alone.  Obtains medications at [facility] without difficulty.  Current with PCP-Dr. Wynetta Emery.  No DME needs at home.  On room air.  He has an appointment at [facility] for a work up for heart transplant on Friday.  Offered home health services-declines.  He has a scale at home and weighs daily.  Patient still currently drives.               Expected Discharge Plan: Home/Self Care Barriers to Discharge: Continued Medical Work up   Patient Goals and CMS Choice        Expected Discharge Plan and Services Expected Discharge Plan: Home/Self Care       Living arrangements for the past 2 months: Single Family Home                           HH Arranged: Patient Refused HH          Prior Living Arrangements/Services Living arrangements for the past 2 months: Single Family Home Lives with:: Self Patient language and need for interpreter reviewed:: Yes Do you feel safe going back to the place where you live?: Yes            Criminal Activity/Legal Involvement Pertinent to Current Situation/Hospitalization: No - Comment as needed  Activities of Daily Living Home Assistive Devices/Equipment: None ADL Screening (condition at time of admission) Patient's cognitive ability adequate to safely complete daily activities?: No Is the patient deaf or have difficulty hearing?: No Does the patient have difficulty seeing, even when wearing glasses/contacts?: No Does the patient have difficulty concentrating, remembering, or making decisions?: No Patient able to express need for assistance with  ADLs?: No Does the patient have difficulty dressing or bathing?: No Independently performs ADLs?: Yes (appropriate for developmental age) Does the patient have difficulty walking or climbing stairs?: No Weakness of Legs: None Weakness of Arms/Hands: None  Permission Sought/Granted                  Emotional Assessment Appearance:: Appears stated age Attitude/Demeanor/Rapport: Gracious Affect (typically observed): Accepting Orientation: : Oriented to  Time, Oriented to Situation, Oriented to Place, Oriented to Self Alcohol / Substance Use: Not Applicable Psych Involvement: No (comment)  Admission diagnosis:  Shortness of breath [R06.02] Acute pulmonary edema (HCC) [J81.0] Failure of outpatient treatment [Z78.9] Acute on chronic congestive heart failure, unspecified heart failure type [facility]) [I50.9] Patient Active Problem List   Diagnosis Date Noted  . Acute on chronic systolic CHF (congestive heart failure) ([place]) 05/02/2019  . Lower GI bleed 04/18/2019  . CHF (congestive heart failure) ([place]) 04/07/2019  . Cough 03/17/2019  . 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 HTN (hypertension) 11/20/2018  . Hypokalemia 08/19/2018  . Syncope 06/30/2018  . CKD (chronic kidney disease) stage 3, GFR 30-59 ml/min (HCC) 05/13/2018  . Hyperlipidemia 05/13/2018  . ED (erectile dysfunction) 05/13/2018  . Osteoarthritis of right knee 04/30/2018  . Gout 03/26/2018  . VF (ventricular fibrillation) ([place])   . Nonischemic cardiomyopathy ([place])   .  Cardiogenic shock ([place]) 12/24/2017  . 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 History of non-ST elevation myocardial infarction (NSTEMI) 09/08/2017  . Bradycardia 08/30/2017  . Ventricular tachycardia ([place]) 07/29/2017  . COPD (chronic obstructive pulmonary disease) ([place]) 07/04/2017  . TIA (transient ischemic attack) 06/22/2016  . Benign hypertensive renal disease 06/22/2016  . Chronic systolic congestive heart failure ([place]) 01/20/2016  .  Cardiomyopathy ([place]) 01/20/2016   PCP:  Valerie Roys, DO Pharmacy:   [facility], [place] [address] [place] [address] Phone: [phone] Fax: [phone]     Social Determinants of Health (SDOH) Interventions    Readmission Risk Interventions Readmission Risk Prevention Plan 06/15/2019 05/04/2019 04/08/2019  Transportation Screening Complete Complete -  Medication Review (RN Care Manager) Complete - Complete  PCP or Specialist appointment within 3-5 days of discharge - - Complete  HRI or Home Care Consult Patient refused - Complete  SW Recovery Care/Counseling Consult Patient refused - Patient refused  Palliative Care Screening Patient Refused - Not [place] Not Applicable - Not Applicable  Some recent data might be hidden

## 2019-06-16 DIAGNOSIS — R112 Nausea with vomiting, unspecified: Secondary | ICD-10-CM

## 2019-06-16 LAB — CBC
HCT: 41.6 % (ref 39.0–52.0)
Hemoglobin: 13.2 g/dL (ref 13.0–17.0)
MCH: 28.7 pg (ref 26.0–34.0)
MCHC: 31.7 g/dL (ref 30.0–36.0)
MCV: 90.4 fL (ref 80.0–100.0)
Platelets: 295 10*3/uL (ref 150–400)
RBC: 4.6 MIL/uL (ref 4.22–5.81)
RDW: 17.3 % — ABNORMAL HIGH (ref 11.5–15.5)
WBC: 11.6 10*3/uL — ABNORMAL HIGH (ref 4.0–10.5)
nRBC: 0 % (ref 0.0–0.2)

## 2019-06-16 LAB — BASIC METABOLIC PANEL
Anion gap: 13 (ref 5–15)
BUN: 51 mg/dL — ABNORMAL HIGH (ref 6–20)
CO2: 22 mmol/L (ref 22–32)
Calcium: 9.7 mg/dL (ref 8.9–10.3)
Chloride: 102 mmol/L (ref 98–111)
Creatinine, Ser: 3.29 mg/dL — ABNORMAL HIGH (ref 0.61–1.24)
GFR calc Af Amer: 23 mL/min — ABNORMAL LOW (ref >60)
GFR calc non Af Amer: 20 mL/min — ABNORMAL LOW (ref >60)
Glucose, Bld: 156 mg/dL — ABNORMAL HIGH (ref 70–99)
Potassium: 5.6 mmol/L — ABNORMAL HIGH (ref 3.5–5.1)
Sodium: 137 mmol/L (ref 135–145)

## 2019-06-16 LAB — EXPECTORATED SPUTUM ASSESSMENT W GRAM STAIN, RFLX TO RESP C: Special Requests: NORMAL

## 2019-06-16 LAB — H. PYLORI BREATH TEST: H. pylori UBiT: NEGATIVE

## 2019-06-16 MED ORDER — PANTOPRAZOLE SODIUM 40 MG IV SOLR
40.0000 mg | Freq: Two times a day (BID) | INTRAVENOUS | Status: DC
  Administered 2019-06-17 – 2019-06-24 (×13): 40 mg via INTRAVENOUS
  Filled 2019-06-16 (×15): qty 40

## 2019-06-16 MED ORDER — LINACLOTIDE 290 MCG PO CAPS: 290.0000 ug | ORAL_CAPSULE | Freq: Every day | ORAL | Status: DC

## 2019-06-16 MED ORDER — SPIRONOLACTONE 25 MG PO TABS
25.0000 mg | ORAL_TABLET | Freq: Every day | ORAL | Status: AC
  Administered 2019-06-16: 25 mg via ORAL
  Filled 2019-06-16: qty 1

## 2019-06-16 MED ORDER — SODIUM CHLORIDE 0.9 % IV SOLN
INTRAVENOUS | Status: DC
  Administered 2019-06-16: 12:00:00 via INTRAVENOUS

## 2019-06-16 MED ORDER — LINACLOTIDE 145 MCG PO CAPS
145.0000 ug | ORAL_CAPSULE | Freq: Every day | ORAL | Status: DC
  Administered 2019-06-17 – 2019-06-25 (×7): 145 ug via ORAL
  Filled 2019-06-16 (×10): qty 1

## 2019-06-16 MED ORDER — SODIUM ZIRCONIUM CYCLOSILICATE 5 G PO PACK
5.0000 g | PACK | Freq: Every day | ORAL | Status: DC
  Administered 2019-06-16: 5 g via ORAL
  Filled 2019-06-16: qty 1

## 2019-06-16 MED ORDER — ENOXAPARIN SODIUM 30 MG/0.3ML ~~LOC~~ SOLN
30.0000 mg | SUBCUTANEOUS | Status: DC
  Administered 2019-06-16: 30 mg via SUBCUTANEOUS
  Filled 2019-06-16: qty 0.3

## 2019-06-16 NOTE — Progress Notes (Signed)
[facility]  ROUNDING NOTE   Subjective:   Complains of nausea this morning.  K 5.6   Creatinine 3.29 (2.28)  Objective:  Vital signs in last 24 hours:  Temp:  [98.3 F (36.8 C)-98.9 F (37.2 C)] 98.3 F (36.8 C) (06/16 0745) Pulse Rate:  [81-85] 81 (06/16 0745) Resp:  [19-20] 20 (06/16 0745) BP: (101-113)/(76-97) 101/76 (06/16 0745) SpO2:  [90 %-94 %] 94 % (06/16 0745) Weight:  [97.7 kg] 97.7 kg (06/16 0313)  Weight change: -0.181 kg Filed Weights   06/14/19 0440 06/15/19 0202 06/16/19 0313  Weight: 98.1 kg 97.9 kg 97.7 kg    Intake/Output: I/O last 3 completed shifts: In: 498 [P.O.:480; I.V.:18] Out: 1150 [Urine:1150]   Intake/Output this shift:  Total I/O In: -  Out: 100 [Urine:100]  Physical Exam: General: NAD,   Head: Normocephalic, atraumatic. Moist oral mucosal membranes  Eyes: Anicteric, PERRL  Neck: Supple, trachea midline  Lungs:  Clear to auscultation  Heart: Regular rate and rhythm  Abdomen:  Soft, nontender,   Extremities: no peripheral edema.  Neurologic: Nonfocal, moving all four extremities  Skin: No lesions       Basic Metabolic Panel: Recent Labs  Lab 06/12/19 1818 06/13/19 0050 06/13/19 0641 06/14/19 0447 06/15/19 0503 06/15/19 0504 06/16/19 0343  NA 134* 137  --  140  --  140 137  K 4.8 3.8  --  3.6 5.2* 5.2* 5.6*  CL 103 102  --  105  --  107 102  CO2 19* 25  --  22  --  21* 22  GLUCOSE 172* 151*  --  112*  --  142* 156*  BUN 27* 28*  --  32*  --  36* 51*  CREATININE 1.91* 1.99*  --  1.80*  --  2.28* 3.29*  CALCIUM 9.4 9.2  --  9.5  --  9.7 9.7  MG  --   --  2.2 2.3  --   --   --     Liver Function Tests: Recent Labs  Lab 06/12/19 1818  AST 14*  ALT 14  ALKPHOS 104  BILITOT 0.7  PROT 7.5  ALBUMIN 3.5   Recent Labs  Lab 06/12/19 1818  LIPASE 20   No results for input(s): AMMONIA in the last 168 hours.  CBC: Recent Labs  Lab 06/12/19 1818 06/13/19 0050 06/15/19 1901 06/16/19 0343  WBC  8.1 7.5  --  11.6*  HGB 14.3 13.4 13.5 13.2  HCT 44.2 41.4 42.1 41.6  MCV 88.0 87.5  --  90.4  PLT 384 337  --  295    Cardiac Enzymes: Recent Labs  Lab 06/12/19 1818 06/13/19 0050 06/13/19 0641  TROPONINI 0.07* 0.10* 0.12*    BNP: Invalid input(s): POCBNP  CBG: Recent Labs  Lab 06/15/19 0237  GLUCAP 180*    Microbiology: Results for orders placed or performed during the hospital encounter of 06/12/19  SARS Coronavirus 2 (CEPHEID- Performed in [place] hospital lab), Hosp Order     Status: None   Collection Time: 06/12/19  9:52 PM   Specimen: Nasopharyngeal Swab  Result Value Ref Range Status   SARS Coronavirus 2 NEGATIVE NEGATIVE Final    Comment: (NOTE) If result is NEGATIVE SARS-CoV-2 target nucleic acids are NOT DETECTED. The SARS-CoV-2 RNA is generally detectable in upper and lower  respiratory specimens during the acute phase of infection. The lowest  concentration of SARS-CoV-2 viral copies this assay can detect is 250  copies / mL. A  negative result does not preclude SARS-CoV-2 infection  and should not be used as the sole basis for treatment or other  patient management decisions.  A negative result may occur with  improper specimen collection / handling, submission of specimen other  than nasopharyngeal swab, presence of viral mutation(s) within the  areas targeted by this assay, and inadequate number of viral copies  (<250 copies / mL). A negative result must be combined with clinical  observations, patient history, and epidemiological information. If result is POSITIVE SARS-CoV-2 target nucleic acids are DETECTED. The SARS-CoV-2 RNA is generally detectable in upper and lower  respiratory specimens dur ing the acute phase of infection.  Positive  results are indicative of active infection with SARS-CoV-2.  Clinical  correlation with patient history and other diagnostic information is  necessary to determine patient infection status.  Positive  results do  not rule out bacterial infection or co-infection with other viruses. If result is PRESUMPTIVE POSTIVE SARS-CoV-2 nucleic acids MAY BE PRESENT.   A presumptive positive result was obtained on the submitted specimen  and confirmed on repeat testing.  While 2019 novel coronavirus  (SARS-CoV-2) nucleic acids may be present in the submitted sample  additional confirmatory testing may be necessary for epidemiological  and / or clinical management purposes  to differentiate between  SARS-CoV-2 and other Sarbecovirus currently known to infect humans.  If clinically indicated additional testing with an alternate test  methodology [phone]) is advised. The SARS-CoV-2 RNA is generally  detectable in upper and lower respiratory sp ecimens during the acute  phase of infection. The expected result is Negative. Fact Sheet for Patients:  StrictlyIdeas.no Fact Sheet for Healthcare Providers: BankingDealers.co.za This test is not yet approved or cleared by the [place] FDA and has been authorized for detection and/or diagnosis of SARS-CoV-2 by FDA under an Emergency Use Authorization (EUA).  This EUA will remain in effect (meaning this test can be used) for the duration of the COVID-19 declaration under Section 564(b)(1) of the Act, 21 U.S.C. section 360bbb-3(b)(1), unless the authorization is terminated or revoked sooner. Performed at [facility], [address] [place], [place] [address]   Expectorated sputum assessment w rflx to resp cult     Status: None   Collection Time: 06/15/19 10:49 PM   Specimen: Expectorated Sputum  Result Value Ref Range Status   Specimen Description EXPECTORATED SPUTUM  Final   Special Requests Normal  Final   Sputum evaluation   Final    THIS SPECIMEN IS ACCEPTABLE FOR SPUTUM CULTURE Performed at [facility], [address], [place], [place] [address]    Report Status 06/16/2019  FINAL  Final  Culture, respiratory     Status: None (Preliminary result)   Collection Time: 06/15/19 10:49 PM  Result Value Ref Range Status   Specimen Description   Final    EXPECTORATED SPUTUM Performed at [facility], [address], [place], [place] [address]    Special Requests   Final    Normal Reflexed from [id number] Performed at [facility], [address] [place], [place] [address]    Gram Stain   Final    MODERATE WBC PRESENT, PREDOMINANTLY PMN ABUNDANT GRAM POSITIVE COCCI ABUNDANT GRAM POSITIVE RODS Performed at [facility], [place] [address] [place], [place] [address]    Culture PENDING  Incomplete   Report Status PENDING  Incomplete    Coagulation Studies: No results for input(s): LABPROT, INR in the last 72 hours.  Urinalysis: No results for input(s): COLORURINE, LABSPEC, PHURINE, GLUCOSEU, HGBUR, BILIRUBINUR, KETONESUR, PROTEINUR, UROBILINOGEN, NITRITE, LEUKOCYTESUR in the last 72 hours.  Invalid input(s): APPERANCEUR    Imaging: Dg Abd Acute 2+v W 1v Chest  Result Date: 06/14/2019 CLINICAL DATA:  Shortness of breath. Intractable nausea and vomiting. New right lower abdominal pain. EXAM: DG ABDOMEN ACUTE W/ 1V CHEST COMPARISON:  Two-view chest x-ray 06/12/2019 FINDINGS: Heart is enlarged. Moderate pulmonary vascular congestion is present. There is slight increase in a diffuse interstitial pattern. Bibasilar opacities are present without significant consolidation. The bowel gas pattern is normal. Mild prominence of stool in the ascending colon and rectum. No obstruction is present. Degenerative changes are noted in the lower lumbar spine. IMPRESSION: 1. Stable cardiomegaly with increasing interstitial pattern consistent with worsening edema. 2. Infection versus atelectasis at the lung bases. 3. Normal bowel gas pattern.  No obstruction or free air. Electronically Signed   By: San Morelle M.D.   On: 06/14/2019 15:16     Medications:    . sodium chloride Stopped (06/15/19 0330)  . sodium chloride     . amiodarone  200 mg Oral BID  . aspirin EC  81 mg Oral Daily  . atorvastatin  40 mg Oral q1800  . carvedilol  3.125 mg Oral BID WC  . clopidogrel  75 mg Oral Daily  . enoxaparin (LOVENOX) injection  30 mg Subcutaneous Q24H  . feeding supplement (ENSURE ENLIVE)  237 mL Oral BID BM  . [START ON 06/17/2019] linaclotide  145 mcg Oral QAC breakfast  . methimazole  40 mg Oral Daily  . mometasone-formoterol  2 puff Inhalation BID  . multivitamin with minerals  1 tablet Oral Daily  . pantoprazole (PROTONIX) IV  40 mg Intravenous Q12H  . polyethylene glycol  17 g Oral Daily  . senna-docusate  1 tablet Oral Daily  . sodium chloride flush  3 mL Intravenous Q12H  . sodium zirconium cyclosilicate  5 g Oral Daily  . tiotropium  1 capsule Inhalation Daily   sodium chloride, acetaminophen **OR** acetaminophen, guaiFENesin-dextromethorphan, ondansetron **OR** ondansetron (ZOFRAN) IV, promethazine  Assessment/ Plan:  [name] is a 75 y.o. black male withchronic systolic heart failure ejection fraction 10-15%, prior history of cardiac arrest, hyperlipidemia, nephrolithiasis, hypertension, history of TIA, was admitted to [facility] on[facility]/12/2019  for acute exacerbation of systolic congestive heart failure, EF 10%  1. Acute renal failure: secondary to over diuresis and prerenal from GI losses.  2. Chronic kidney disease stage III baseline creatinine 1.85, GFR of 47 on 01/24/19. Bland urine.   3. Metabolic acidosis  4. Hyperkalemia  5. Hypertension  Plan - Start NS infusion - monitor volume status.  - Start Lokelma.    LOS: 4 Jordon Kristiansen 6/16/202011:28 AM

## 2019-06-16 NOTE — Consult Note (Signed)
PHARMACY CONSULT NOTE - FOLLOW UP  Pharmacy Consult for Electrolyte Monitoring and Replacement   Recent Labs: Potassium (mmol/L)  Date Value  06/16/2019 5.6 (H)   Magnesium (mg/dL)  Date Value  06/14/2019 2.3   Calcium (mg/dL)  Date Value  06/16/2019 9.7   Albumin (g/dL)  Date Value  06/12/2019 3.5  03/17/2019 4.3   Phosphorus (mg/dL)  Date Value  05/03/2019 4.1   Sodium (mmol/L)  Date Value  06/16/2019 137  05/15/2019 137     Assessment: Patient presents to the ED with a complaint of shortness of breath and chest discomfort.  He also states he has had some abdominal swelling and recent increase in weight of about 8 pounds.  He has a known history of heart failure and has been in our facility somewhat frequently lately. Started on furosemide 20 mg IV q12H. On spironolactone 12.5 mg daily- d/c'ed due to elevated potassium.   Goal of Therapy:  K+ 4-5, Mg 2- 2.4   Plan:  Holding spironolactone. K+ 5.6 pt received 40 mEq x 2 on 5/14 per MD orders. Will need to correct potassium with insulin/dextrose or using sodium plystyrene sulfonate or patiromer. Will recommend  Will follow up with AM labs and replace as necessary.   Stephen [name] ,PharmD, BCPS Clinical Pharmacist 06/16/2019 8:47 AM

## 2019-06-16 NOTE — Progress Notes (Signed)
[place] [facility] [place] NAME: Stephen [name]    MR#:  [id number]  DATE OF BIRTH:  [birth year]  SUBJECTIVE:  CHIEF COMPLAINT: Patient is reporting some improvement nausea and vomiting and weakness.  REVIEW OF SYSTEMS:  CONSTITUTIONAL: No fever, fatigue or weakness.  EYES: No blurred or double vision.  EARS, NOSE, AND THROAT: No tinnitus or ear pain.  RESPIRATORY: No cough, improving shortness of breath, denies wheezing or hemoptysis.  CARDIOVASCULAR: No chest pain, orthopnea, edema.  GASTROINTESTINAL: Reporting nausea, vomiting, denies diarrhea or abdominal pain.  GENITOURINARY: No dysuria, hematuria.  ENDOCRINE: No polyuria, nocturia,  HEMATOLOGY: No anemia, easy bruising or bleeding SKIN: No rash or lesion. MUSCULOSKELETAL: No joint pain or arthritis.   NEUROLOGIC: No tingling, numbness, weakness.  PSYCHIATRY: No anxiety or depression.   DRUG ALLERGIES:   Allergies  Allergen Reactions  . Lisinopril Cough    VITALS:  Blood pressure 105/71, pulse 82, temperature 98.3 F (36.8 C), temperature source Oral, resp. rate 18, height 6' 1.5" (1.867 m), weight 97.7 kg, SpO2 93 %.  PHYSICAL EXAMINATION:  GENERAL:  53 y.o.-year-old patient lying in the bed with no acute distress.  EYES: Pupils equal, round, reactive to light and accommodation. No scleral icterus. Extraocular muscles intact.  HEENT: Head atraumatic, normocephalic. Oropharynx and nasopharynx clear.  NECK:  Supple, no jugular venous distention. No thyroid enlargement, no tenderness.  LUNGS: Normal breath sounds bilaterally, no wheezing, rales,rhonchi or crepitation. No use of accessory muscles of respiration.  CARDIOVASCULAR: S1, S2 normal. No murmurs, rubs, or gallops.  ABDOMEN: Soft, nontender, nondistended. Bowel sounds present. EXTREMITIES: No pedal edema, cyanosis, or clubbing.  NEUROLOGIC: Cranial nerves II through XII are intact. Muscle strength at his baseline in all  extremities. Sensation intact. Gait not checked.  PSYCHIATRIC: The patient is alert and oriented x 3.  SKIN: No obvious rash, lesion, or ulcer.    LABORATORY PANEL:   CBC Recent Labs  Lab 06/16/19 0343  WBC 11.6*  HGB 13.2  HCT 41.6  PLT 295   ------------------------------------------------------------------------------------------------------------------  Chemistries  Recent Labs  Lab 06/12/19 1818  06/14/19 0447  06/16/19 0343  NA 134*   < > 140   < > 137  K 4.8   < > 3.6   < > 5.6*  CL 103   < > 105   < > 102  CO2 19*   < > 22   < > 22  GLUCOSE 172*   < > 112*   < > 156*  BUN 27*   < > 32*   < > 51*  CREATININE 1.91*   < > 1.80*   < > 3.29*  CALCIUM 9.4   < > 9.5   < > 9.7  MG  --    < > 2.3  --   --   AST 14*  --   --   --   --   ALT 14  --   --   --   --   ALKPHOS 104  --   --   --   --   BILITOT 0.7  --   --   --   --    < > = values in this interval not displayed.   ------------------------------------------------------------------------------------------------------------------  Cardiac Enzymes Recent Labs  Lab 06/13/19 0641  TROPONINI 0.12*   ------------------------------------------------------------------------------------------------------------------  RADIOLOGY:  No results found.  EKG:   Orders placed or performed during the hospital encounter of 06/12/19  .  EKG 12-Lead  . EKG 12-Lead  . ED EKG  . ED EKG  . EKG 12-Lead  . EKG 12-Lead  . EKG 12-Lead  . EKG 12-Lead    ASSESSMENT AND PLAN:   #AK I -chronic kidney disease stage III-secondary to overdiuresis and GI losses  monitoring of renal function and avoid nephrotoxins  Baseline creatinine 1.9 Creatinine at 2.28 -3.29  Hydrate with IV fluids closely monitor for symptoms and signs of fluid overload  #Intractable nausea and vomiting-could be from GERD PPI twice daily, Carafate 1 g 4 times a day Check H. pylori breath/stool test Patient is not a good candidate for endoscopy  given his cardiac history -Per previous records Discussed with Dr. Marius Ditch not considering any procedures at this point of time  #Hyperkalemia Lokelma  # Acute on chronic systolic congestive heart failure with history of cardiomyopathy- nonischemic, ejection fraction around 10 percent with AICD Monitor patient on telemetry Clinically improving IV diuretics Lasix 20 mg every 12 hrs changed to p.o. Lasix. which is discontinued in view of intractable nausea and vomiting associated with worsening of renal function  Troponin 0 0.07-0.10-0.12 Seen by cardiology [name].  Appreciate his recommendations.  Considering AICD interrogation Daily weights, intake and output Continue home medication aspirin 81 mg, Plavix, Coreg, statin Continue amiodarone 200 mg 2 times a day Goal is to keep his potassium at 4 and magnesium at 2.0   #COPD chronic no exacerbation Continue bronchodilators  #Essential hypertension   #Hyperlipidemia continue home dose statin  DVT prophylaxis with Lovenox subcu renal dose adjusted All the records are reviewed and case discussed with Care Management/Social Workerr. Management plans discussed with the patient, he is  in agreement.  CODE STATUS: fc   TOTAL TIME TAKING CARE OF THIS PATIENT: 36  minutes.   POSSIBLE D/C IN 1-2 DAYS, DEPENDING ON CLINICAL CONDITION.  Note: This dictation was prepared with Dragon dictation along with smaller phrase technology. Any transcriptional errors that result from this process are unintentional.   [name] M.D on 06/16/2019 at 5:20 PM  Between 7am to 6pm - Pager - [phone] After 6pm go to [email or web address] - password [email or web address] [facility]  Office  [phone]  CC: Primary care physician; Valerie Roys, DO

## 2019-06-16 NOTE — Consult Note (Signed)
Stephen Darby, MD [address]  [place]  [place], [place] [address]  Main: [phone]  Fax: [phone] Pager: [phone]   Consultation  Referring Provider:     No ref. provider found Primary Care Physician:  Valerie Roys, DO Primary Gastroenterologist:  Dr. Vira Agar         Reason for Consultation:     Nausea and emesis  Date of Admission:  06/12/2019 Date of Consultation:  06/16/2019         HPI:   BARTON WANT is a 53 y.o. male with history of nonischemic cardiomyopathy, EF 10%, defibrillator and pacemaker, chronic kidney disease admitted with shortness of breath, nausea and vomiting.  Patient is recently seen by [facility] clinic GI in 04/2019 for chronic constipation and intermittent episodes of nausea and nonbloody emesis.  Given his severe cardiomyopathy and undergoing evaluation for heart transplant, patient was deemed a high risk candidate for upper endoscopy and colonoscopy.  Therefore, recommendation was made to perform upper GI series at that time.  However, I do not see that it is performed.  His H. pylori stool antigen was negative.  When I interviewed the patient today, he reports his nausea is better however before breakfast he said he had an episode of emesis as well as cough which were both blood-tinged mixed with mucus.  Patient denies abdominal pain, his last bowel movement was about 2 days ago and he was started on low-dose linaclotide as outpatient.  Patient denies having melena, rectal bleeding.  His bowel movements are generally brown in color according to him.  He is on Protonix 40 mg twice daily, antiemetics as needed.  Patient denies drinking alcohol.  He had a CT abdomen and pelvis without contrast in 01/2019 which revealed subtle nodularity of the liver suggesting cirrhosis but there was no evidence of portal hypertension, no thrombocytopenia.  His hemoglobin is within normal limits and at baseline.  Patient is currently on both aspirin and  Plavix.   NSAIDs: none  Antiplts/Anticoagulants/Anti thrombotics: asa and plavix  GI Procedures: none  Past Medical History:  Diagnosis Date   AICD (automatic cardioverter/defibrillator) present    AKI (acute kidney injury) ([place]) 12/24/2017   Arrhythmia    Arthritis    "lower back, knees" (06/10/2018)   Cardiac arrest ([place]) 12/23/2017   Brief V-fib arrest   Chest pain 09/08/2017   CHF (congestive heart failure) ([place])    nonischemic cardiomyopathy, EF 25%   Gout    "on daily RX" (06/10/2018)   Headache    "q couple months" (06/10/2018)   High cholesterol    History of kidney stones    Hypertension    Influenza A 12/24/2017   NICM (nonischemic cardiomyopathy) ([place])    Pneumonia 12/20/2017   TIA (transient ischemic attack) 06/21/2016   "still affects my memory a little bit" (06/10/2018)    Past Surgical History:  Procedure Laterality Date   EXTERNAL FIXATION LEG Right ~ 2000   "was going bowlegged; had to brake my leg to fix it"   HERNIA REPAIR     ICD IMPLANT N/A 12/30/2017   Procedure: ICD IMPLANT;  Surgeon: Deboraha Sprang, MD;  Location: [facility];  Service: Cardiovascular;  Laterality: N/A;   LEFT HEART CATH AND CORONARY ANGIOGRAPHY N/A 09/09/2017   Procedure: LEFT HEART CATH AND CORONARY ANGIOGRAPHY;  Surgeon: Teodoro Spray, MD;  Location: [facility];  Service: Cardiovascular;  Laterality: N/A;   UMBILICAL HERNIA REPAIR  [place]  06/10/2018   V TACH ABLATION N/A 06/10/2018   Procedure: V TACH ABLATION;  Surgeon: Thompson Grayer, MD;  Location: [facility];  Service: Cardiovascular;  Laterality: N/A;   VASECTOMY      Prior to Admission medications   Medication Sig Start Date End Date Taking? Authorizing Provider  acetaminophen (TYLENOL) 325 MG tablet Take 2 tablets (650 mg total) by mouth every 6 (six) hours as needed. Patient taking differently: Take 325-650 mg by mouth every 6 (six) hours as needed for  moderate pain.  03/19/18  Yes Carrie Mew, MD  albuterol (PROVENTIL HFA;VENTOLIN HFA) 108 (90 Base) MCG/ACT inhaler Inhale 2 puffs into the lungs every 4 (four) hours as needed for wheezing or shortness of breath. 09/19/18  Yes Johnson, Megan P, DO  allopurinol (ZYLOPRIM) 100 MG tablet Take 2 tablets (200 mg total) by mouth daily. Patient taking differently: Take 100 mg by mouth 2 (two) times daily.  10/19/18  Yes Johnson, Megan P, DO  amiodarone (PACERONE) 200 MG tablet Take 200 mg by mouth 2 (two) times daily.   Yes [provider]  aspirin 81 MG chewable tablet Chew 81 mg by mouth daily.   Yes [provider]  atorvastatin (LIPITOR) 40 MG tablet Take 1 tablet (40 mg total) by mouth daily at 6 PM. 04/22/19  Yes Hackney, Otila Kluver A, FNP  carvedilol (COREG) 3.125 MG tablet Take 3.125 mg by mouth 2 (two) times daily with a meal.   Yes [provider]  clopidogrel (PLAVIX) 75 MG tablet TAKE ONE TABLET BY MOUTH EVERY DAY Patient taking differently: Take 75 mg by mouth daily.  02/13/19  Yes Bensimhon, Shaune Pascal, MD  colchicine 0.6 MG tablet Take 1 tablet (0.6 mg total) by mouth daily as needed (gout flare). 09/03/18  Yes Johnson, Megan P, DO  cyclobenzaprine (FLEXERIL) 10 MG tablet Take 1 tablet (10 mg total) by mouth at bedtime. Patient taking differently: Take 10 mg by mouth at bedtime as needed for muscle spasms.  05/21/19  Yes Johnson, Megan P, DO  diclofenac sodium (VOLTAREN) 1 % GEL Apply 2 g topically 4 (four) times daily as needed (pain).    Yes [provider]  fluticasone (FLONASE) 50 MCG/ACT nasal spray Place 2 sprays into both nostrils daily. Patient taking differently: Place 2 sprays into both nostrils daily as needed for allergies.  06/20/17  Yes [name], MD  fluticasone-salmeterol (ADVAIR HFA) [phone] MCG/ACT inhaler Inhale 2 puffs into the lungs 2 (two) times daily. 08/12/18  Yes [name], MD  Magnesium 400 MG TABS Take 1 tablet by mouth 2 (two)  times daily. 04/22/19  Yes Hackney, Otila Kluver A, FNP  methimazole (TAPAZOLE) 10 MG tablet Take 4 tablets (40 mg total) by mouth daily. 12/09/18  Yes Mayo, Pete Pelt, MD  metolazone (ZAROXOLYN) 2.5 MG tablet Take 2.5 mg by mouth daily as needed (fluid).  09/05/18  Yes [provider]  Multiple Vitamin (MULTIVITAMIN WITH MINERALS) TABS tablet Take 1 tablet by mouth daily.   Yes [provider]  nitroGLYCERIN (NITROSTAT) 0.4 MG SL tablet Place 1 tablet (0.4 mg total) under the tongue every 5 (five) minutes as needed for chest pain. 04/22/19  Yes [name] A, FNP  ondansetron (ZOFRAN ODT) 4 MG disintegrating tablet Take 1 tablet (4 mg total) by mouth every 8 (eight) hours as needed for nausea or vomiting. 05/30/19  Yes Duanne Guess, PA-C  pantoprazole (PROTONIX) 40 MG tablet Take 1  tablet (40 mg total) by mouth 2 (two) times daily. 03/01/19  Yes Fritzi Mandes, MD  polyethylene glycol (MIRALAX / GLYCOLAX) 17 g packet Take 17 g by mouth daily. 04/20/19  Yes Mody, Ulice Bold, MD  potassium chloride SA (K-DUR,KLOR-CON) 20 MEQ tablet Take 1 tablet (20 mEq total) by mouth 2 (two) times daily. Patient taking differently: Take 20 mEq by mouth 4 (four) times daily.  03/01/19  Yes Fritzi Mandes, MD  sennosides-docusate sodium (SENOKOT-S) 8.6-50 MG tablet Take 1 tablet by mouth daily.   Yes [provider]  sodium chloride (OCEAN) 0.65 % SOLN nasal spray Place 1 spray into both nostrils as needed for congestion.   Yes [provider]  spironolactone (ALDACTONE) 25 MG tablet Take 0.5 tablets (12.5 mg total) by mouth daily. 04/15/19  Yes Vaughan Basta, MD  Tiotropium Bromide Monohydrate (SPIRIVA RESPIMAT) 2.5 MCG/ACT AERS Inhale 1 puff into the lungs daily. 08/12/18  Yes [name], MD  torsemide (DEMADEX) 20 MG tablet Take 1 tablet (20 mg total) by mouth 2 (two) times daily. 04/15/19  Yes Vaughan Basta, MD  chlorpheniramine-HYDROcodone [facility] PENNKINETIC ER) 10-8 MG/5ML SUER  Take 5 mLs by mouth at bedtime as needed for cough. Patient not taking: Reported on 06/01/2019 05/30/19   Duanne Guess, PA-C  promethazine (PHENERGAN) 12.5 MG tablet Take 1 tablet (12.5 mg total) by mouth every 6 (six) hours as needed for nausea or vomiting. Patient not taking: Reported on 05/21/2019 11/24/18   Nance Pear, MD   Current Facility-Administered Medications:    0.9 %  sodium chloride infusion, , Intravenous, PRN, Lance Coon, MD, Stopped at 06/15/19 0330   0.9 %  sodium chloride infusion, , Intravenous, Continuous, Kolluru, Sarath, MD, Last Rate: 50 mL/hr at 06/16/19 1135   acetaminophen (TYLENOL) tablet 650 mg, 650 mg, Oral, Q6H PRN **OR** acetaminophen (TYLENOL) suppository 650 mg, 650 mg, Rectal, Q6H PRN, Lance Coon, MD   amiodarone (PACERONE) tablet 200 mg, 200 mg, Oral, BID, Lance Coon, MD, 200 mg at 06/16/19 5400   aspirin EC tablet 81 mg, 81 mg, Oral, Daily, Lance Coon, MD, 81 mg at 06/16/19 0858   atorvastatin (LIPITOR) tablet 40 mg, 40 mg, Oral, q1800, Lance Coon, MD, 40 mg at 06/15/19 1710   carvedilol (COREG) tablet 3.125 mg, 3.125 mg, Oral, BID WC, [name], Aruna, MD, 3.125 mg at 06/16/19 0858   clopidogrel (PLAVIX) tablet 75 mg, 75 mg, Oral, Daily, Lance Coon, MD, 75 mg at 06/16/19 0858   enoxaparin (LOVENOX) injection 30 mg, 30 mg, Subcutaneous, Q24H, [name], Aruna, MD   feeding supplement (ENSURE ENLIVE) (ENSURE ENLIVE) liquid 237 mL, 237 mL, Oral, BID BM, [name], Aruna, MD, 237 mL at 06/15/19 2057   guaiFENesin-dextromethorphan (ROBITUSSIN DM) 100-10 MG/5ML syrup 5 mL, 5 mL, Oral, Q4H PRN, [name], Aruna, MD, 5 mL at 06/14/19 2247   [START ON 06/17/2019] linaclotide (LINZESS) capsule 145 mcg, 145 mcg, Oral, QAC breakfast, Maedell Hedger, Tally Due, MD   methimazole (TAPAZOLE) tablet 40 mg, 40 mg, Oral, Daily, Lance Coon, MD, 40 mg at 06/15/19 0902   mometasone-formoterol (DULERA) 200-5 MCG/ACT inhaler 2 puff, 2 puff, Inhalation, BID, Lance Coon, MD, 2 puff at 06/16/19 0857   multivitamin with minerals tablet 1 tablet, 1 tablet, Oral, Daily, [name], Aruna, MD, 1 tablet at 06/16/19 0858   ondansetron (ZOFRAN) tablet 4 mg, 4 mg, Oral, Q6H PRN **OR** ondansetron (ZOFRAN) injection 4 mg, 4 mg, Intravenous, Q6H PRN, Lance Coon, MD, 4 mg at 06/16/19 0003   pantoprazole (PROTONIX) injection  40 mg, 40 mg, Intravenous, Q12H, Graviel Payeur, Tally Due, MD   polyethylene glycol (MIRALAX / GLYCOLAX) packet 17 g, 17 g, Oral, Daily, [name], Aruna, MD, 17 g at 06/16/19 0857   promethazine (PHENERGAN) injection 12.5-25 mg, 12.5-25 mg, Intravenous, Q6H PRN, Lance Coon, MD, 12.5 mg at 06/15/19 0257   senna-docusate (Senokot-S) tablet 1 tablet, 1 tablet, Oral, Daily, [name], Aruna, MD, 1 tablet at 06/16/19 0858   sodium chloride flush (NS) 0.9 % injection 3 mL, 3 mL, Intravenous, Q12H, [name], Aruna, MD, 3 mL at 06/16/19 0900   sodium zirconium cyclosilicate (LOKELMA) packet 5 g, 5 g, Oral, Daily, Kolluru, Sarath, MD, 5 g at 06/16/19 1131   tiotropium (SPIRIVA) inhalation capsule ([facility] use ONLY) 18 mcg, 1 capsule, Inhalation, Daily, Lance Coon, MD, 18 mcg at 06/16/19 0856   Family History  Problem Relation Age of Onset   Hypertension Mother    Heart failure Mother    Hypertension Father    CAD Father    Heart attack Father      Social History   Tobacco Use   Smoking status: Former Smoker    Packs/day: 0.33    Years: 33.00    Pack years: 10.89    Types: Cigarettes    Quit date: 02/20/2016    Years since quitting: 3.3   Smokeless tobacco: Never Used  Substance Use Topics   Alcohol use: Yes    Alcohol/week: 0.0 standard drinks    Comment: 06/10/2018 "beer once/month; if that"   Drug use: Never    Allergies as of 06/12/2019 - Review Complete 06/12/2019  Allergen Reaction Noted   Lisinopril Cough 06/21/2016    Review of Systems:    All systems reviewed and negative except where noted in HPI.   Physical Exam:    Vital signs in last 24 hours: Temp:  [98.3 F (36.8 C)-98.9 F (37.2 C)] 98.3 F (36.8 C) (06/16 0745) Pulse Rate:  [81-85] 81 (06/16 0745) Resp:  [19-20] 20 (06/16 0745) BP: (101-113)/(76-97) 101/76 (06/16 0745) SpO2:  [90 %-94 %] 94 % (06/16 0745) Weight:  [97.7 kg] 97.7 kg (06/16 0313) Last BM Date: 06/15/19 General:   Pleasant, cooperative in NAD, chronically ill appearing Head:  Normocephalic and atraumatic. Eyes:   No icterus.   Conjunctiva pink. PERRLA. Ears:  Normal auditory acuity. Neck:  Supple; no masses or thyroidomegaly Lungs: Respirations even and unlabored. Lungs clear to auscultation bilaterally.   No wheezes, crackles, or rhonchi.  Heart:  Regular rate and rhythm;  Without murmur, clicks, rubs or gallops Abdomen:  Soft, mildly distended, tympanic to percussion, nontender. Normal bowel sounds. No appreciable masses or hepatomegaly.  No rebound or guarding.  Rectal:  Not performed. Msk:  Symmetrical without gross deformities.  Strength generalized weakness Extremities:  Without edema, cyanosis or clubbing. Neurologic:  Alert and oriented x3;  grossly normal neurologically. Skin:  Intact without significant lesions or rashes. Psych:  Alert and cooperative. Normal affect.  LAB RESULTS: CBC Latest Ref Rng & Units 06/16/2019 06/15/2019 06/13/2019  WBC 4.0 - 10.5 K/uL 11.6(H) - 7.5  Hemoglobin 13.0 - 17.0 g/dL 13.2 13.5 13.4  Hematocrit 39.0 - 52.0 % 41.6 42.1 41.4  Platelets 150 - 400 K/uL 295 - 337    BMET BMP Latest Ref Rng & Units 06/16/2019 06/15/2019 06/15/2019  Glucose 70 - 99 mg/dL 156(H) 142(H) -  BUN 6 - 20 mg/dL 51(H) 36(H) -  Creatinine 0.61 - 1.24 mg/dL 3.29(H) 2.28(H) -  BUN/Creat Ratio 9 - 20 - - -  Sodium 135 - 145 mmol/L 137 140 -  Potassium 3.5 - 5.1 mmol/L 5.6(H) 5.2(H) 5.2(H)  Chloride 98 - 111 mmol/L 102 107 -  CO2 22 - 32 mmol/L 22 21(L) -  Calcium 8.9 - 10.3 mg/dL 9.7 9.7 -    LFT Hepatic Function Latest Ref Rng & Units 06/12/2019 05/02/2019  04/12/2019  Total Protein 6.5 - 8.1 g/dL 7.5 7.2 -  Albumin 3.5 - 5.0 g/dL 3.5 3.7 3.2(L)  AST 15 - 41 U/L 14(L) 36 -  ALT 0 - 44 U/L 14 38 -  Alk Phosphatase 38 - 126 U/L 104 92 -  Total Bilirubin 0.3 - 1.2 mg/dL 0.7 1.3(H) -  Bilirubin, Direct 0.0 - 0.2 mg/dL 0.2 - -     STUDIES: Dg Abd Acute 2+v W 1v Chest  Result Date: 06/14/2019 CLINICAL DATA:  Shortness of breath. Intractable nausea and vomiting. New right lower abdominal pain. EXAM: DG ABDOMEN ACUTE W/ 1V CHEST COMPARISON:  Two-view chest x-ray 06/12/2019 FINDINGS: Heart is enlarged. Moderate pulmonary vascular congestion is present. There is slight increase in a diffuse interstitial pattern. Bibasilar opacities are present without significant consolidation. The bowel gas pattern is normal. Mild prominence of stool in the ascending colon and rectum. No obstruction is present. Degenerative changes are noted in the lower lumbar spine. IMPRESSION: 1. Stable cardiomegaly with increasing interstitial pattern consistent with worsening edema. 2. Infection versus atelectasis at the lung bases. 3. Normal bowel gas pattern.  No obstruction or free air. Electronically Signed   By: San Morelle M.D.   On: 06/14/2019 15:16      Impression / Plan:   Stephen [name] is a 52 y.o. male with severe nonischemic cardiomyopathy, EF of 10% admitted with shortness of breath, nausea and vomiting.  Apparently patient has intermittent episodes of nausea and vomiting for the last few months, he also has history of chronic constipation, managed with Linzess.  He also have features of cirrhosis on imaging, most likely secondary to CHF  Nausea and vomiting: Most likely gastroparesis or peptic ulcer disease or erosive esophagitis or portal hypertensive gastropathy and currently worse in setting of uremia.  Less likely malignancy Patient did not have gastric outlet obstruction on recent CT scan Recommend upper GI series to evaluate for any obvious  anatomical abnormalities in the upper GI tract Change Protonix to IV twice daily Sucralfate 1 g 4 times a day Low residue diet Antiemetics as needed, can add Reglan 5 mg 3 times daily before each meal short-term With severe cardiomyopathy, performing upper endoscopy under anesthesia will be a high risk procedure unless it is absolutely indicated  Thank you for involving me in the care of this patient.      LOS: 4 days   Sherri Sear, MD  06/16/2019, 1:13 PM   Note: This dictation was prepared with Dragon dictation along with smaller phrase technology. Any transcriptional errors that result from this process are unintentional.

## 2019-06-16 NOTE — Progress Notes (Signed)
Cardiac Rehab Navigator/ Exercise Physiologist Note   This EP rounded on patient. Patient sleeping and asked this EP to come back at 2pm. This EP will review Consent for Virtual Rehab and [facility] app with Patient at that time.   Patient is feeling very nauseated. Patient is disappointed about this admission thinking that it will interfere with his Heart Transplant work up at [facility] on Friday. Patient is interested in participating  In Virtual Rehab but did not feel like talking at this time. Patient was given Department e-mail and phone number contact information. If for some reason patient does not contact the department, we will reach out to patient to schedule his initial appointment.  This EP reviewed the 5 Simple Steps to Living Better with Heart Failure with the patient. This EP provided patient with a calendar to record his daily weights and advised patient to call his Physician when he either gains or loses more than 2-3 lbs in a day or 5 lbs in a week because this is a sign of fluid overload or dehydration. Patient voiced understanding.   Stephen [name], [facility]  Exercise Physiologist Department Phone #: [phone] Fax: [phone]  Direct Line [phone] Email Address: [email or web address]

## 2019-06-16 NOTE — Plan of Care (Signed)
Still having hemoptysis, sputum sample sent. Did have to put pt on 4L O2 this morning, states he was feeling short of breath sats originally in the 80's but quickly recovered. Pt treated for nausea once with zofran over night, but states he feels dehydrated, hasn't been able to eat/drink much  Problem: Activity: Goal: Risk for activity intolerance will decrease Outcome: Progressing   Problem: Coping: Goal: Level of anxiety will decrease Outcome: Progressing   Problem: Pain Managment: Goal: General experience of comfort will improve Outcome: Progressing Note: No complaints of pain this shift   Problem: Safety: Goal: Ability to remain free from injury will improve Outcome: Progressing   Problem: Skin Integrity: Goal: Risk for impaired skin integrity will decrease Outcome: Progressing

## 2019-06-17 ENCOUNTER — Inpatient Hospital Stay: Payer: PRIVATE HEALTH INSURANCE

## 2019-06-17 LAB — RENAL FUNCTION PANEL
Albumin: 3.3 g/dL — ABNORMAL LOW (ref 3.5–5.0)
Anion gap: 15 (ref 5–15)
BUN: 74 mg/dL — ABNORMAL HIGH (ref 6–20)
CO2: 21 mmol/L — ABNORMAL LOW (ref 22–32)
Calcium: 9.7 mg/dL (ref 8.9–10.3)
Chloride: 97 mmol/L — ABNORMAL LOW (ref 98–111)
Creatinine, Ser: 4.17 mg/dL — ABNORMAL HIGH (ref 0.61–1.24)
GFR calc Af Amer: 18 mL/min — ABNORMAL LOW (ref >60)
GFR calc non Af Amer: 15 mL/min — ABNORMAL LOW (ref >60)
Glucose, Bld: 144 mg/dL — ABNORMAL HIGH (ref 70–99)
Phosphorus: 4.8 mg/dL — ABNORMAL HIGH (ref 2.5–4.6)
Potassium: 6.2 mmol/L — ABNORMAL HIGH (ref 3.5–5.1)
Sodium: 133 mmol/L — ABNORMAL LOW (ref 135–145)

## 2019-06-17 LAB — BASIC METABOLIC PANEL
Anion gap: 15 (ref 5–15)
BUN: 72 mg/dL — ABNORMAL HIGH (ref 6–20)
CO2: 21 mmol/L — ABNORMAL LOW (ref 22–32)
Calcium: 9.7 mg/dL (ref 8.9–10.3)
Chloride: 97 mmol/L — ABNORMAL LOW (ref 98–111)
Creatinine, Ser: 4.06 mg/dL — ABNORMAL HIGH (ref 0.61–1.24)
GFR calc Af Amer: 18 mL/min — ABNORMAL LOW (ref >60)
GFR calc non Af Amer: 16 mL/min — ABNORMAL LOW (ref >60)
Glucose, Bld: 144 mg/dL — ABNORMAL HIGH (ref 70–99)
Potassium: 6.2 mmol/L — ABNORMAL HIGH (ref 3.5–5.1)
Sodium: 133 mmol/L — ABNORMAL LOW (ref 135–145)

## 2019-06-17 LAB — H PYLORI, IGM, IGG, IGA AB
H Pylori IgG: 0.25 Index Value (ref 0.00–0.79)
H. Pylogi, Iga Abs: <9 units (ref 0.0–8.9)
H. Pylogi, Igm Abs: <9 units (ref 0.0–8.9)

## 2019-06-17 LAB — HEMOGLOBIN AND HEMATOCRIT, BLOOD
HCT: 36.6 % — ABNORMAL LOW (ref 39.0–52.0)
Hemoglobin: 12 g/dL — ABNORMAL LOW (ref 13.0–17.0)

## 2019-06-17 LAB — MRSA PCR SCREENING: MRSA by PCR: NEGATIVE

## 2019-06-17 LAB — OCCULT BLOOD X 1 CARD TO LAB, STOOL: Fecal Occult Bld: POSITIVE — AB

## 2019-06-17 MED ORDER — SODIUM CHLORIDE 0.9 % IV SOLN
1.0000 g | Freq: Two times a day (BID) | INTRAVENOUS | Status: DC
  Administered 2019-06-17 – 2019-06-19 (×5): 1 g via INTRAVENOUS
  Filled 2019-06-17 (×7): qty 1

## 2019-06-17 MED ORDER — VANCOMYCIN VARIABLE DOSE PER UNSTABLE RENAL FUNCTION (PHARMACIST DOSING): Status: DC

## 2019-06-17 MED ORDER — HEPARIN SODIUM (PORCINE) 5000 UNIT/ML IJ SOLN
5000.0000 [IU] | Freq: Three times a day (TID) | INTRAMUSCULAR | Status: DC
  Administered 2019-06-17 – 2019-06-26 (×22): 5000 [IU] via SUBCUTANEOUS
  Filled 2019-06-17 (×22): qty 1

## 2019-06-17 MED ORDER — SODIUM ZIRCONIUM CYCLOSILICATE 10 G PO PACK
10.0000 g | PACK | Freq: Two times a day (BID) | ORAL | Status: DC
  Administered 2019-06-17 – 2019-06-21 (×6): 10 g via ORAL
  Filled 2019-06-17 (×12): qty 1

## 2019-06-17 MED ORDER — SODIUM ZIRCONIUM CYCLOSILICATE 10 G PO PACK
10.0000 g | PACK | Freq: Every day | ORAL | Status: DC
  Filled 2019-06-17: qty 1

## 2019-06-17 MED ORDER — SODIUM CHLORIDE 0.9% FLUSH: 10.0000 mL | INTRAVENOUS | Status: DC | PRN

## 2019-06-17 MED ORDER — FUROSEMIDE 10 MG/ML IJ SOLN
80.0000 mg | Freq: Once | INTRAMUSCULAR | Status: AC
  Administered 2019-06-17: 80 mg via INTRAVENOUS
  Filled 2019-06-17: qty 8

## 2019-06-17 MED ORDER — SODIUM CHLORIDE 0.9% FLUSH
10.0000 mL | Freq: Two times a day (BID) | INTRAVENOUS | Status: DC
  Administered 2019-06-19 – 2019-06-26 (×13): 10 mL

## 2019-06-17 MED ORDER — VANCOMYCIN HCL 10 G IV SOLR
2000.0000 mg | Freq: Once | INTRAVENOUS | Status: DC
  Filled 2019-06-17: qty 2000

## 2019-06-17 NOTE — Progress Notes (Signed)
Cardiac Rehab Navigator/ Exercise Physiologist Note  This EP rounded on patient for signs of improvement or readiness for Cardiac Virtual Rehab program. Patient reports feeling worse. Patient reports that he may begin Dialysis. Patient is short of breath at rest. This EP will continue to follow up with patient. Patient would not be able to participate based on his current health status. This EP will not sign patient up with [facility] app at this time based on his comorbidities and exacerbations of one or more of those illnesses.  [name], [facility]  Exercise Physiologist Department Phone #: [phone] Fax: [phone]  Direct Line [phone] Email Address: [email or web address]

## 2019-06-17 NOTE — Plan of Care (Signed)
  Problem: Clinical Measurements: Goal: Respiratory complications will improve Outcome: Progressing Note: On and off acute O2 for comfort   Problem: Coping: Goal: Level of anxiety will decrease Outcome: Progressing   Problem: Pain Managment: Goal: General experience of comfort will improve Outcome: Progressing Note: No complaints of pain this shift   Problem: Safety: Goal: Ability to remain free from injury will improve Outcome: Progressing   Problem: Skin Integrity: Goal: Risk for impaired skin integrity will decrease Outcome: Progressing   Problem: Clinical Measurements: Goal: Diagnostic test results will improve Outcome: Not Progressing Note: Potassium, BUN & creatinine continue to rise

## 2019-06-17 NOTE — Progress Notes (Signed)
[facility]  ROUNDING NOTE   Subjective:   K 6.2.   Creatinine 4.14 (3.29) (2.28)  Objective:  Vital signs in last 24 hours:  Temp:  [98.3 F (36.8 C)-98.9 F (37.2 C)] 98.4 F (36.9 C) (06/17 0723) Pulse Rate:  [78-85] 78 (06/17 0903) Resp:  [18-20] 19 (06/17 0723) BP: (94-105)/(70-85) 99/80 (06/17 0903) SpO2:  [91 %-98 %] 98 % (06/17 0723) Weight:  [99.7 kg] 99.7 kg (06/17 0525)  Weight change: 1.95 kg Filed Weights   06/15/19 0202 06/16/19 0313 06/17/19 0525  Weight: 97.9 kg 97.7 kg 99.7 kg    Intake/Output: I/O last 3 completed shifts: In: 144 [P.O.:120; I.V.:24] Out: 800 [Urine:800]   Intake/Output this shift:  No intake/output data recorded.  Physical Exam: General: NAD,   Head: Normocephalic, atraumatic. Moist oral mucosal membranes  Eyes: Anicteric, PERRL  Neck: Supple, trachea midline  Lungs:  Crackles at bases bilaterally  Heart: Regular rate and rhythm  Abdomen:  Soft, nontender,   Extremities: no peripheral edema.  Neurologic: Nonfocal, moving all four extremities  Skin: No lesions       Basic Metabolic Panel: Recent Labs  Lab 06/13/19 0050 06/13/19 0641 06/14/19 0447 06/15/19 0503 06/15/19 0504 06/16/19 0343 06/17/19 0249  NA 137  --  140  --  140 137 133*  133*  K 3.8  --  3.6 5.2* 5.2* 5.6* 6.2*  6.2*  CL 102  --  105  --  107 102 97*  97*  CO2 25  --  22  --  21* 22 21*  21*  GLUCOSE 151*  --  112*  --  142* 156* 144*  144*  BUN 28*  --  32*  --  36* 51* 74*  72*  CREATININE 1.99*  --  1.80*  --  2.28* 3.29* 4.17*  4.06*  CALCIUM 9.2  --  9.5  --  9.7 9.7 9.7  9.7  MG  --  2.2 2.3  --   --   --   --   PHOS  --   --   --   --   --   --  4.8*    Liver Function Tests: Recent Labs  Lab 06/12/19 1818 06/17/19 0249  AST 14*  --   ALT 14  --   ALKPHOS 104  --   BILITOT 0.7  --   PROT 7.5  --   ALBUMIN 3.5 3.3*   Recent Labs  Lab 06/12/19 1818  LIPASE 20   No results for input(s): AMMONIA in the last  168 hours.  CBC: Recent Labs  Lab 06/12/19 1818 06/13/19 0050 06/15/19 1901 06/16/19 0343  WBC 8.1 7.5  --  11.6*  HGB 14.3 13.4 13.5 13.2  HCT 44.2 41.4 42.1 41.6  MCV 88.0 87.5  --  90.4  PLT 384 337  --  295    Cardiac Enzymes: Recent Labs  Lab 06/12/19 1818 06/13/19 0050 06/13/19 0641  TROPONINI 0.07* 0.10* 0.12*    BNP: Invalid input(s): POCBNP  CBG: Recent Labs  Lab 06/15/19 0237  GLUCAP 180*    Microbiology: Results for orders placed or performed during the hospital encounter of 06/12/19  SARS Coronavirus 2 (CEPHEID- Performed in [facility] hospital lab), Hosp Order     Status: None   Collection Time: 06/12/19  9:52 PM   Specimen: Nasopharyngeal Swab  Result Value Ref Range Status   SARS Coronavirus 2 NEGATIVE NEGATIVE Final    Comment: (NOTE) If result  is NEGATIVE SARS-CoV-2 target nucleic acids are NOT DETECTED. The SARS-CoV-2 RNA is generally detectable in upper and lower  respiratory specimens during the acute phase of infection. The lowest  concentration of SARS-CoV-2 viral copies this assay can detect is 250  copies / mL. A negative result does not preclude SARS-CoV-2 infection  and should not be used as the sole basis for treatment or other  patient management decisions.  A negative result may occur with  improper specimen collection / handling, submission of specimen other  than nasopharyngeal swab, presence of viral mutation(s) within the  areas targeted by this assay, and inadequate number of viral copies  (<250 copies / mL). A negative result must be combined with clinical  observations, patient history, and epidemiological information. If result is POSITIVE SARS-CoV-2 target nucleic acids are DETECTED. The SARS-CoV-2 RNA is generally detectable in upper and lower  respiratory specimens dur ing the acute phase of infection.  Positive  results are indicative of active infection with SARS-CoV-2.  Clinical  correlation with patient history  and other diagnostic information is  necessary to determine patient infection status.  Positive results do  not rule out bacterial infection or co-infection with other viruses. If result is PRESUMPTIVE POSTIVE SARS-CoV-2 nucleic acids MAY BE PRESENT.   A presumptive positive result was obtained on the submitted specimen  and confirmed on repeat testing.  While 2019 novel coronavirus  (SARS-CoV-2) nucleic acids may be present in the submitted sample  additional confirmatory testing may be necessary for epidemiological  and / or clinical management purposes  to differentiate between  SARS-CoV-2 and other Sarbecovirus currently known to infect humans.  If clinically indicated additional testing with an alternate test  methodology [phone]) is advised. The SARS-CoV-2 RNA is generally  detectable in upper and lower respiratory sp ecimens during the acute  phase of infection. The expected result is Negative. Fact Sheet for Patients:  StrictlyIdeas.no Fact Sheet for Healthcare Providers: BankingDealers.co.za This test is not yet approved or cleared by the [place] FDA and has been authorized for detection and/or diagnosis of SARS-CoV-2 by FDA under an Emergency Use Authorization (EUA).  This EUA will remain in effect (meaning this test can be used) for the duration of the COVID-19 declaration under Section 564(b)(1) of the Act, 21 U.S.C. section 360bbb-3(b)(1), unless the authorization is terminated or revoked sooner. Performed at [facility], [address] [place], [place] [address]   Expectorated sputum assessment w rflx to resp cult     Status: None   Collection Time: 06/15/19 10:49 PM   Specimen: Expectorated Sputum  Result Value Ref Range Status   Specimen Description EXPECTORATED SPUTUM  Final   Special Requests Normal  Final   Sputum evaluation   Final    THIS SPECIMEN IS ACCEPTABLE FOR SPUTUM CULTURE Performed at  [facility], [address], [place], [place] [address]    Report Status 06/16/2019 FINAL  Final  Culture, respiratory     Status: None (Preliminary result)   Collection Time: 06/15/19 10:49 PM  Result Value Ref Range Status   Specimen Description   Final    EXPECTORATED SPUTUM Performed at [facility], [address] [place], [place] [address]    Special Requests   Final    Normal Reflexed from [phone] Performed at [facility], [address] [place], [place] [address]    Gram Stain   Final    MODERATE WBC PRESENT, PREDOMINANTLY PMN ABUNDANT GRAM POSITIVE COCCI ABUNDANT  GRAM POSITIVE RODS    Culture   Final    CULTURE REINCUBATED FOR BETTER GROWTH Performed at [facility], [place] [address], [place], [place] [address]    Report Status PENDING  Incomplete    Coagulation Studies: No results for input(s): LABPROT, INR in the last 72 hours.  Urinalysis: No results for input(s): COLORURINE, LABSPEC, PHURINE, GLUCOSEU, HGBUR, BILIRUBINUR, KETONESUR, PROTEINUR, UROBILINOGEN, NITRITE, LEUKOCYTESUR in the last 72 hours.  Invalid input(s): APPERANCEUR    Imaging: Dg Ugi W Single Cm (sol Or Thin Ba)  Result Date: 06/17/2019 CLINICAL DATA:  Nausea vomiting. EXAM: UPPER GI SERIES WITH KUB TECHNIQUE: After obtaining a scout radiograph a routine upper GI series was performed using thin density barium FLUOROSCOPY TIME:  Fluoroscopy Time:  1 minutes 30 seconds Radiation Exposure Index (if provided by the fluoroscopic device): 50.9 mGy Number of Acquired Spot Images: 17 COMPARISON:  Abdomen series 06/14/2019. FINDINGS: KUB reveals cardiomegaly and right lower lobe infiltrate. Right lower lobe infiltrate would be consistent with pneumonia. Limited exam due to patient vomiting. Persistent lung smooth narrowing noted of the distal third of the thoracic esophagus. Endoscopic evaluation should be considered stomach appears normal. Duodenum appears normal. C-loop  appears normal. No definite reflux noted. IMPRESSION: 1.  Cardiomegaly. 2.  Right lower lobe infiltrates consistent with pneumonia. 3. Long smooth narrowing over the distal third of the thoracic esophagus. Endoscopic evaluation should be considered. 4. Stomach and duodenum appear normal. This exam was limited due to patient vomiting. Electronically Signed   By: Marcello Moores  Register   On: 06/17/2019 08:37     Medications:   . sodium chloride 20 mL (06/17/19 1117)  . ceFEPime (MAXIPIME) IV 1 g (06/17/19 1118)  . vancomycin     . amiodarone  200 mg Oral BID  . aspirin EC  81 mg Oral Daily  . atorvastatin  40 mg Oral q1800  . carvedilol  3.125 mg Oral BID WC  . clopidogrel  75 mg Oral Daily  . feeding supplement (ENSURE ENLIVE)  237 mL Oral BID BM  . heparin injection (subcutaneous)  5,000 Units Subcutaneous Q8H  . linaclotide  145 mcg Oral QAC breakfast  . methimazole  40 mg Oral Daily  . mometasone-formoterol  2 puff Inhalation BID  . multivitamin with minerals  1 tablet Oral Daily  . pantoprazole (PROTONIX) IV  40 mg Intravenous Q12H  . polyethylene glycol  17 g Oral Daily  . senna-docusate  1 tablet Oral Daily  . sodium chloride flush  3 mL Intravenous Q12H  . sodium zirconium cyclosilicate  10 g Oral Daily  . tiotropium  1 capsule Inhalation Daily  . vancomycin variable dose per unstable renal function (pharmacist dosing)   Does not apply See admin instructions   sodium chloride, acetaminophen **OR** acetaminophen, guaiFENesin-dextromethorphan, ondansetron **OR** ondansetron (ZOFRAN) IV, promethazine  Assessment/ Plan:  [name] is a 53 y.o. black male withchronic systolic heart failure ejection fraction 10-15%, prior history of cardiac arrest, hyperlipidemia, nephrolithiasis, hypertension, history of TIA, was [facility]/12[facility]  for acute exacerbation of systolic congestive heart failure, EF 10%  1. Acute renal failure with hyperkalemia and metabolic  acidosis onChronic kidney disease stage III baseline creatinine 1.85, GFR of 47 on 01/24/19. Bland urine.  Concerning for pulmonary edema and worsening renal function and hyperkalemia.  - Patient will need dialysis. Consult vascular. However patient wants to speak to his sister first.  - Check viral hepatitis panel and quantiferon gold - Lokelma  -  Furosemide IV bolus today, 80mg   2. Hypotension with systolic congestive heart failure:   On carvedilol. Discuss with cardiology if this can be held due to hyperkalemia and hypotension.     LOS: 5 Burk Hoctor 6/17/202012:07 PM

## 2019-06-17 NOTE — Progress Notes (Signed)
Stephen [name] at [place] NAME: Stephen [name]    MR#:  [id number]  DATE OF BIRTH:  10/13/1966  SUBJECTIVE:  CHIEF COMPLAINT: Patient is feeling weak vomiting is better.  Denies any blood in his vomit.  Wants to talk to his sister before considering hemodialysis REVIEW OF SYSTEMS:  CONSTITUTIONAL: No fever, fatigue or weakness.  EYES: No blurred or double vision.  EARS, NOSE, AND THROAT: No tinnitus or ear pain.  RESPIRATORY: No cough, improving shortness of breath, denies wheezing or hemoptysis.  CARDIOVASCULAR: No chest pain, orthopnea, edema.  GASTROINTESTINAL: Reporting nausea, vomiting, denies diarrhea or abdominal pain.  GENITOURINARY: No dysuria, hematuria.  ENDOCRINE: No polyuria, nocturia,  HEMATOLOGY: No anemia, easy bruising or bleeding SKIN: No rash or lesion. MUSCULOSKELETAL: No joint pain or arthritis.   NEUROLOGIC: No tingling, numbness, weakness.  PSYCHIATRY: No anxiety or depression.   DRUG ALLERGIES:   Allergies  Allergen Reactions  . Lisinopril Cough    VITALS:  Blood pressure 99/80, pulse 78, temperature 98.4 F (36.9 C), temperature source Oral, resp. rate 19, height 6' 1.5" (1.867 m), weight 99.7 kg, SpO2 98 %.  PHYSICAL EXAMINATION:  GENERAL:  53 y.o.-year-old patient lying in the bed with no acute distress.  EYES: Pupils equal, round, reactive to light and accommodation. No scleral icterus. Extraocular muscles intact.  HEENT: Head atraumatic, normocephalic. Oropharynx and nasopharynx clear.  NECK:  Supple, no jugular venous distention. No thyroid enlargement, no tenderness.  LUNGS: Normal breath sounds bilaterally, no wheezing, rales,rhonchi or crepitation. No use of accessory muscles of respiration.  CARDIOVASCULAR: S1, S2 normal. No murmurs, rubs, or gallops.  ABDOMEN: Soft, nontender, nondistended. Bowel sounds present. EXTREMITIES: No pedal edema, cyanosis, or clubbing.  NEUROLOGIC: Cranial nerves II  through XII are intact. Muscle strength at his baseline in all extremities. Sensation intact. Gait not checked.  PSYCHIATRIC: The patient is alert and oriented x 3.  SKIN: No obvious rash, lesion, or ulcer.    LABORATORY PANEL:   CBC Recent Labs  Lab 06/16/19 0343  WBC 11.6*  HGB 13.2  HCT 41.6  PLT 295   ------------------------------------------------------------------------------------------------------------------  Chemistries  Recent Labs  Lab 06/12/19 1818  06/14/19 0447  06/17/19 0249  NA 134*   < > 140   < > 133*  133*  K 4.8   < > 3.6   < > 6.2*  6.2*  CL 103   < > 105   < > 97*  97*  CO2 19*   < > 22   < > 21*  21*  GLUCOSE 172*   < > 112*   < > 144*  144*  BUN 27*   < > 32*   < > 74*  72*  CREATININE 1.91*   < > 1.80*   < > 4.17*  4.06*  CALCIUM 9.4   < > 9.5   < > 9.7  9.7  MG  --    < > 2.3  --   --   AST 14*  --   --   --   --   ALT 14  --   --   --   --   ALKPHOS 104  --   --   --   --   BILITOT 0.7  --   --   --   --    < > = values in this interval not displayed.   ------------------------------------------------------------------------------------------------------------------  Cardiac Enzymes Recent Labs  Lab  06/13/19 0641  TROPONINI 0.12*   ------------------------------------------------------------------------------------------------------------------  RADIOLOGY:  Scherrie Merritts W Single Cm (sol Or Thin Ba)  Result Date: 06/17/2019 CLINICAL DATA:  Nausea vomiting. EXAM: UPPER GI SERIES WITH KUB TECHNIQUE: After obtaining a scout radiograph a routine upper GI series was performed using thin density barium FLUOROSCOPY TIME:  Fluoroscopy Time:  1 minutes 30 seconds Radiation Exposure Index (if provided by the fluoroscopic device): 50.9 mGy Number of Acquired Spot Images: 17 COMPARISON:  Abdomen series 06/14/2019. FINDINGS: KUB reveals cardiomegaly and right lower lobe infiltrate. Right lower lobe infiltrate would be consistent with pneumonia.  Limited exam due to patient vomiting. Persistent lung smooth narrowing noted of the distal third of the thoracic esophagus. Endoscopic evaluation should be considered stomach appears normal. Duodenum appears normal. C-loop appears normal. No definite reflux noted. IMPRESSION: 1.  Cardiomegaly. 2.  Right lower lobe infiltrates consistent with pneumonia. 3. Long smooth narrowing over the distal third of the thoracic esophagus. Endoscopic evaluation should be considered. 4. Stomach and duodenum appear normal. This exam was limited due to patient vomiting. Electronically Signed   By: Marcello Moores  Register   On: 06/17/2019 08:37    EKG:   Orders placed or performed during the hospital encounter of 06/12/19  . EKG 12-Lead  . EKG 12-Lead  . ED EKG  . ED EKG  . EKG 12-Lead  . EKG 12-Lead  . EKG 12-Lead  . EKG 12-Lead    ASSESSMENT AND PLAN:   #AK I -chronic kidney disease stage III-secondary to overdiuresis and GI losses  monitoring of renal function and avoid nephrotoxins  Baseline creatinine 1.9 Creatinine at 2.28 -3.29 -4.17 Hydrated with very gentle IV fluids briefly and discontinued as patient felt dyspneic Nephrology is recommending dialysis as renal function is getting worse.  Patient would like to talk to cardiology and his family members before considering dialysis  #Intractable nausea and vomiting-could be from GERD PPI twice daily, Carafate 1 g 4 times a day Check H. pylori breath/stool test Patient is not a good candidate for endoscopy given his cardiac history -Per previous records Discussed with Dr. Marius Ditch not considering any procedures at this point of time Appreciate the recommendations.  GI signed off  #Hyperkalemia Lokelma  # Acute on chronic systolic congestive heart failure with history of cardiomyopathy- nonischemic, ejection fraction around 10 percent with AICD Monitor patient on telemetry Clinically improving IV diuretics Lasix 20 mg every 12 hrs changed to p.o. Lasix.  which is discontinued in view of intractable nausea and vomiting associated with worsening of renal function  Troponin 0 0.07-0.10-0.12 Seen by cardiology [name].  Appreciate his recommendations.  Considering AICD interrogation Daily weights, intake and output Continue home medication aspirin 81 mg, Plavix, Coreg, statin Continue amiodarone 200 mg 2 times a day Goal is to keep his potassium at 4 and magnesium at 2.0   #COPD chronic no exacerbation Continue bronchodilators  #Essential hypertension   #Hyperlipidemia continue home dose statin  DVT prophylaxis with Lovenox subcu renal dose adjusted All the records are reviewed and case discussed with Care Management/Social Workerr. Management plans discussed with the patient, he prefers talking to his sister and cardiology before he considers hemodialysis..  Patient prefers him talking to his sister first, requested me not to talk to her at this time  CODE STATUS: [place]: 36  minutes.   POSSIBLE D/C IN 1-2 DAYS, DEPENDING ON CLINICAL CONDITION.  Note: This dictation was prepared with Dragon dictation  along with smaller phrase technology. Any transcriptional errors that result from this process are unintentional.   [name] M.D on 06/17/2019 at 3:07 PM  Between 7am to 6pm - Pager - [phone] After 6pm go to [email or web address] - password [email or web address] [facility]  Office  [phone]  CC: Primary care physician; Valerie Roys, DO

## 2019-06-17 NOTE — Progress Notes (Addendum)
Pt refuses to sign consent for Perma cath placement tomorrow, states that he will think about it tomorrow.   [place] made aware that stool occult test is positive, no new orders received.

## 2019-06-17 NOTE — Progress Notes (Signed)
Stephen Darby, MD [address]  [place]  [place], [place] [address]  Main: [phone]  Fax: [phone] Pager: [phone]   Subjective: Stephen [name] denies having nausea or vomiting today.  Stephen [name] is tolerating liquid diet well.  Stephen [name] underwent upper GI series today   Objective: Vital signs in last 24 hours: Vitals:   06/17/19 0525 06/17/19 0723 06/17/19 0901 06/17/19 0903  BP: 104/85 94/70 99/80  99/80  Pulse: 80 79 79 78  Resp: 20 19    Temp: 98.5 F (36.9 C) 98.4 F (36.9 C)    TempSrc: Oral Oral    SpO2: 92% 98%    Weight: 99.7 kg     Height:       Weight change: 1.95 kg  Intake/Output Summary (Last 24 hours) at 06/17/2019 1330 Last data filed at 06/17/2019 0640 Gross per 24 hour  Intake 126 ml  Output 400 ml  Net -274 ml     Exam: Heart:: Regular rate and rhythm or S1S2 present Lungs: normal and clear to auscultation Abdomen: soft, nontender, normal bowel sounds   Lab Results: CBC Latest Ref Rng & Units 06/16/2019 06/15/2019 06/13/2019  WBC 4.0 - 10.5 K/uL 11.6(H) - 7.5  Hemoglobin 13.0 - 17.0 g/dL 13.2 13.5 13.4  Hematocrit 39.0 - 52.0 % 41.6 42.1 41.4  Platelets 150 - 400 K/uL 295 - 337   CMP Latest Ref Rng & Units 06/17/2019 06/17/2019 06/16/2019  Glucose 70 - 99 mg/dL 144(H) 144(H) 156(H)  BUN 6 - 20 mg/dL 74(H) 72(H) 51(H)  Creatinine 0.61 - 1.24 mg/dL 4.17(H) 4.06(H) 3.29(H)  Sodium 135 - 145 mmol/L 133(L) 133(L) 137  Potassium 3.5 - 5.1 mmol/L 6.2(H) 6.2(H) 5.6(H)  Chloride 98 - 111 mmol/L 97(L) 97(L) 102  CO2 22 - 32 mmol/L 21(L) 21(L) 22  Calcium 8.9 - 10.3 mg/dL 9.7 9.7 9.7  Total Protein 6.5 - 8.1 g/dL - - -  Total Bilirubin 0.3 - 1.2 mg/dL - - -  Alkaline Phos 38 - 126 U/L - - -  AST 15 - 41 U/L - - -  ALT 0 - 44 U/L - - -    Micro Results: Recent Results (from the past 240 hour(s))  SARS Coronavirus 2 (CEPHEID- Performed in [facility] hospital lab), Hosp Order     Status: None   Collection Time: 06/12/19  9:52 PM   Specimen:  Nasopharyngeal Swab  Result Value Ref Range Status   SARS Coronavirus 2 NEGATIVE NEGATIVE Final    Comment: (NOTE) If result is NEGATIVE SARS-CoV-2 target nucleic acids are NOT DETECTED. The SARS-CoV-2 RNA is generally detectable in upper and lower  respiratory specimens during the acute phase of infection. The lowest  concentration of SARS-CoV-2 viral copies this assay can detect is 250  copies / mL. A negative result does not preclude SARS-CoV-2 infection  and should not be used as the sole basis for treatment or other  patient management decisions.  A negative result may occur with  improper specimen collection / handling, submission of specimen other  than nasopharyngeal swab, presence of viral mutation(s) within the  areas targeted by this assay, and inadequate number of viral copies  (<250 copies / mL). A negative result must be combined with clinical  observations, patient history, and epidemiological information. If result is POSITIVE SARS-CoV-2 target nucleic acids are DETECTED. The SARS-CoV-2 RNA is generally detectable in upper and lower  respiratory specimens dur ing the acute phase of infection.  Positive  results are indicative of  active infection with SARS-CoV-2.  Clinical  correlation with patient history and other diagnostic information is  necessary to determine patient infection status.  Positive results do  not rule out bacterial infection or co-infection with other viruses. If result is PRESUMPTIVE POSTIVE SARS-CoV-2 nucleic acids MAY BE PRESENT.   A presumptive positive result was obtained on the submitted specimen  and confirmed on repeat testing.  While 2019 novel coronavirus  (SARS-CoV-2) nucleic acids may be present in the submitted sample  additional confirmatory testing may be necessary for epidemiological  and / or clinical management purposes  to differentiate between  SARS-CoV-2 and other Sarbecovirus currently known to infect humans.  If clinically  indicated additional testing with an alternate test  methodology [phone]) is advised. The SARS-CoV-2 RNA is generally  detectable in upper and lower respiratory sp ecimens during the acute  phase of infection. The expected result is Negative. Fact Sheet for Patients:  StrictlyIdeas.no Fact Sheet for Healthcare Providers: BankingDealers.co.za This test is not yet approved or cleared by the [place] FDA and has been authorized for detection and/or diagnosis of SARS-CoV-2 by FDA under an Emergency Use Authorization (EUA).  This EUA will remain in effect (meaning this test can be used) for the duration of the COVID-19 declaration under Section 564(b)(1) of the Act, 21 U.S.C. section 360bbb-3(b)(1), unless the authorization is terminated or revoked sooner. Performed at [facility], [address] [place], [place] [address]   Expectorated sputum assessment w rflx to resp cult     Status: None   Collection Time: 06/15/19 10:49 PM   Specimen: Expectorated Sputum  Result Value Ref Range Status   Specimen Description EXPECTORATED SPUTUM  Final   Special Requests Normal  Final   Sputum evaluation   Final    THIS SPECIMEN IS ACCEPTABLE FOR SPUTUM CULTURE Performed at [facility], [address] [place], [place] [address]    Report Status 06/16/2019 FINAL  Final  Culture, respiratory     Status: None (Preliminary result)   Collection Time: 06/15/19 10:49 PM  Result Value Ref Range Status   Specimen Description   Final    EXPECTORATED SPUTUM Performed at [facility], [address] [place], [place] [address]    Special Requests   Final    Normal Reflexed from [id number] Performed at [facility], [address] [place], [place] [address]    Gram Stain   Final    MODERATE WBC PRESENT, PREDOMINANTLY PMN ABUNDANT GRAM POSITIVE COCCI ABUNDANT GRAM POSITIVE RODS    Culture   Final    CULTURE  REINCUBATED FOR BETTER GROWTH Performed at [facility], [place] [address] [place], [place] [address]    Report Status PENDING  Incomplete  MRSA PCR Screening     Status: None   Collection Time: 06/17/19 11:20 AM   Specimen: Nasopharyngeal  Result Value Ref Range Status   MRSA by PCR NEGATIVE NEGATIVE Final    Comment:        The GeneXpert MRSA Assay (FDA approved for NASAL specimens only), is one component of a comprehensive MRSA colonization surveillance program. It is not intended to diagnose MRSA infection nor to guide or monitor treatment for MRSA infections. Performed at [facility], [address], [place], [place] [address]    Studies/Results: Frederik Schmidt Single Cm (sol Or Thin Ba)  Result Date: 06/17/2019 CLINICAL DATA:  Nausea vomiting. EXAM: UPPER GI SERIES WITH KUB TECHNIQUE: After obtaining a scout radiograph  a routine upper GI series was performed using thin density barium FLUOROSCOPY TIME:  Fluoroscopy Time:  1 minutes 30 seconds Radiation Exposure Index (if provided by the fluoroscopic device): 50.9 mGy Number of Acquired Spot Images: 17 COMPARISON:  Abdomen series 06/14/2019. FINDINGS: KUB reveals cardiomegaly and right lower lobe infiltrate. Right lower lobe infiltrate would be consistent with pneumonia. Limited exam due to patient vomiting. Persistent lung smooth narrowing noted of the distal third of the thoracic esophagus. Endoscopic evaluation should be considered stomach appears normal. Duodenum appears normal. C-loop appears normal. No definite reflux noted. IMPRESSION: 1.  Cardiomegaly. 2.  Right lower lobe infiltrates consistent with pneumonia. 3. Long smooth narrowing over the distal third of the thoracic esophagus. Endoscopic evaluation should be considered. 4. Stomach and duodenum appear normal. This exam was limited due to patient vomiting. Electronically Signed   By: Marcello Moores  Register   On: 06/17/2019 08:37   Medications:  I have reviewed the  patient's current medications. Prior to Admission:  Medications Prior to Admission  Medication Sig Dispense Refill Last Dose  . acetaminophen (TYLENOL) 325 MG tablet Take 2 tablets (650 mg total) by mouth every 6 (six) hours as needed. (Patient taking differently: Take 325-650 mg by mouth every 6 (six) hours as needed for moderate pain. ) 60 tablet 0 prn at prn  . albuterol (PROVENTIL HFA;VENTOLIN HFA) 108 (90 Base) MCG/ACT inhaler Inhale 2 puffs into the lungs every 4 (four) hours as needed for wheezing or shortness of breath. 1 Inhaler 0 prn at prn  . allopurinol (ZYLOPRIM) 100 MG tablet Take 2 tablets (200 mg total) by mouth daily. (Patient taking differently: Take 100 mg by mouth 2 (two) times daily. ) 60 tablet 6 06/12/2019 at Unknown time  . amiodarone (PACERONE) 200 MG tablet Take 200 mg by mouth 2 (two) times daily.   06/12/2019 at 0730  . aspirin 81 MG chewable tablet Chew 81 mg by mouth daily.   06/12/2019 at Unknown time  . atorvastatin (LIPITOR) 40 MG tablet Take 1 tablet (40 mg total) by mouth daily at 6 PM. 30 tablet 5 06/11/2019 at Unknown time  . carvedilol (COREG) 3.125 MG tablet Take 3.125 mg by mouth 2 (two) times daily with a meal.   06/12/2019 at 0730  . clopidogrel (PLAVIX) 75 MG tablet TAKE ONE TABLET BY MOUTH EVERY DAY (Patient taking differently: Take 75 mg by mouth daily. ) 30 tablet 5 06/12/2019 at 0730  . colchicine 0.6 MG tablet Take 1 tablet (0.6 mg total) by mouth daily as needed (gout flare). 6 tablet 1 prn at prn  . cyclobenzaprine (FLEXERIL) 10 MG tablet Take 1 tablet (10 mg total) by mouth at bedtime. (Patient taking differently: Take 10 mg by mouth at bedtime as needed for muscle spasms. ) 30 tablet 0 prn at prn  . diclofenac sodium (VOLTAREN) 1 % GEL Apply 2 g topically 4 (four) times daily as needed (pain).    prn at prn  . fluticasone (FLONASE) 50 MCG/ACT nasal spray Place 2 sprays into both nostrils daily. (Patient taking differently: Place 2 sprays into both nostrils  daily as needed for allergies. ) 16 g 2 prn at prn  . fluticasone-salmeterol (ADVAIR HFA) 115-21 MCG/ACT inhaler Inhale 2 puffs into the lungs 2 (two) times daily. 1 Inhaler 12 06/12/2019 at Unknown time  . Magnesium 400 MG TABS Take 1 tablet by mouth 2 (two) times daily. 60 tablet 5 06/12/2019 at Unknown time  . methimazole (TAPAZOLE) 10 MG tablet Take 4  tablets (40 mg total) by mouth daily. 120 tablet 0 06/12/2019 at Unknown time  . metolazone (ZAROXOLYN) 2.5 MG tablet Take 2.5 mg by mouth daily as needed (fluid).   11 prn at prn  . Multiple Vitamin (MULTIVITAMIN WITH MINERALS) TABS tablet Take 1 tablet by mouth daily.   06/12/2019 at Unknown time  . nitroGLYCERIN (NITROSTAT) 0.4 MG SL tablet Place 1 tablet (0.4 mg total) under the tongue every 5 (five) minutes as needed for chest pain. 25 tablet 3 prn at prn  . ondansetron (ZOFRAN ODT) 4 MG disintegrating tablet Take 1 tablet (4 mg total) by mouth every 8 (eight) hours as needed for nausea or vomiting. 20 tablet 0 prn at prn  . pantoprazole (PROTONIX) 40 MG tablet Take 1 tablet (40 mg total) by mouth 2 (two) times daily. 60 tablet 2 06/12/2019 at Unknown time  . polyethylene glycol (MIRALAX / GLYCOLAX) 17 g packet Take 17 g by mouth daily. 14 each 0 06/12/2019 at Unknown time  . potassium chloride SA (K-DUR,KLOR-CON) 20 MEQ tablet Take 1 tablet (20 mEq total) by mouth 2 (two) times daily. (Patient taking differently: Take 20 mEq by mouth 4 (four) times daily. ) 30 tablet 1 06/12/2019 at Unknown time  . sennosides-docusate sodium (SENOKOT-S) 8.6-50 MG tablet Take 1 tablet by mouth daily.   06/12/2019 at Unknown time  . sodium chloride (OCEAN) 0.65 % SOLN nasal spray Place 1 spray into both nostrils as needed for congestion.   prn at prn  . spironolactone (ALDACTONE) 25 MG tablet Take 0.5 tablets (12.5 mg total) by mouth daily. 30 tablet 0 06/12/2019 at Unknown time  . Tiotropium Bromide Monohydrate (SPIRIVA RESPIMAT) 2.5 MCG/ACT AERS Inhale 1 puff into the  lungs daily. 1 Inhaler 12 06/12/2019 at Unknown time  . torsemide (DEMADEX) 20 MG tablet Take 1 tablet (20 mg total) by mouth 2 (two) times daily. 60 tablet 0 06/12/2019 at 0730  . chlorpheniramine-HYDROcodone (TUSSIONEX PENNKINETIC ER) 10-8 MG/5ML SUER Take 5 mLs by mouth at bedtime as needed for cough. (Patient not taking: Reported on 06/01/2019) 60 mL 0 Completed Course at Unknown time  . promethazine (PHENERGAN) 12.5 MG tablet Take 1 tablet (12.5 mg total) by mouth every 6 (six) hours as needed for nausea or vomiting. (Patient not taking: Reported on 05/21/2019) 20 tablet 0 Not Taking at Unknown time   Scheduled: . amiodarone  200 mg Oral BID  . aspirin EC  81 mg Oral Daily  . atorvastatin  40 mg Oral q1800  . clopidogrel  75 mg Oral Daily  . feeding supplement (ENSURE ENLIVE)  237 mL Oral BID BM  . heparin injection (subcutaneous)  5,000 Units Subcutaneous Q8H  . linaclotide  145 mcg Oral QAC breakfast  . methimazole  40 mg Oral Daily  . mometasone-formoterol  2 puff Inhalation BID  . multivitamin with minerals  1 tablet Oral Daily  . pantoprazole (PROTONIX) IV  40 mg Intravenous Q12H  . polyethylene glycol  17 g Oral Daily  . senna-docusate  1 tablet Oral Daily  . sodium chloride flush  3 mL Intravenous Q12H  . sodium zirconium cyclosilicate  10 g Oral Daily  . tiotropium  1 capsule Inhalation Daily   Continuous: . sodium chloride 20 mL (06/17/19 1117)  . ceFEPime (MAXIPIME) IV 1 g (06/17/19 1118)   XTK:WIOXBD chloride, acetaminophen **OR** acetaminophen, guaiFENesin-dextromethorphan, ondansetron **OR** ondansetron (ZOFRAN) IV, promethazine Anti-infectives (From admission, onward)   Start     Dose/Rate Route Frequency Ordered Stop  06/17/19 1000  ceFEPIme (MAXIPIME) 1 g in sodium chloride 0.9 % 100 mL IVPB     1 g 200 mL/hr over 30 Minutes Intravenous Every 12 hours 06/17/19 0921     06/17/19 1000  vancomycin (VANCOCIN) 2,000 mg in sodium chloride 0.9 % 500 mL IVPB  Status:   Discontinued     2,000 mg 250 mL/hr over 120 Minutes Intravenous  Once 06/17/19 0921 06/17/19 1255   06/17/19 0920  vancomycin variable dose per unstable renal function (pharmacist dosing)  Status:  Discontinued      Does not apply See admin instructions 06/17/19 0921 06/17/19 1255     Scheduled Meds: . amiodarone  200 mg Oral BID  . aspirin EC  81 mg Oral Daily  . atorvastatin  40 mg Oral q1800  . clopidogrel  75 mg Oral Daily  . feeding supplement (ENSURE ENLIVE)  237 mL Oral BID BM  . heparin injection (subcutaneous)  5,000 Units Subcutaneous Q8H  . linaclotide  145 mcg Oral QAC breakfast  . methimazole  40 mg Oral Daily  . mometasone-formoterol  2 puff Inhalation BID  . multivitamin with minerals  1 tablet Oral Daily  . pantoprazole (PROTONIX) IV  40 mg Intravenous Q12H  . polyethylene glycol  17 g Oral Daily  . senna-docusate  1 tablet Oral Daily  . sodium chloride flush  3 mL Intravenous Q12H  . sodium zirconium cyclosilicate  10 g Oral Daily  . tiotropium  1 capsule Inhalation Daily   Continuous Infusions: . sodium chloride 20 mL (06/17/19 1117)  . ceFEPime (MAXIPIME) IV 1 g (06/17/19 1118)   PRN Meds:.sodium chloride, acetaminophen **OR** acetaminophen, guaiFENesin-dextromethorphan, ondansetron **OR** ondansetron (ZOFRAN) IV, promethazine   Assessment: Principal Problem:   Acute on chronic systolic CHF (congestive heart failure) (HCC) Active Problems:   COPD (chronic obstructive pulmonary disease) (HCC)   CKD (chronic kidney disease) stage 3, GFR 30-59 ml/min (HCC)   Hyperlipidemia   HTN (hypertension)  Worsening creatinine, hyperkalemia, oliguric renal failure, nephrology is leaning towards dialysis  Plan: Upper GI series today showed smooth narrowing of the distal esophagus, likely esophagitis, less likely malignancy.  Given his severe cardiomyopathy and acute kidney failure, Stephen [name] will be extremely high risk for upper endoscopy and I noted that nephrology is  leaning towards dialysis, waiting to hear from the family.  Depending on the final decision from his family, and after cardiology clearance, we can proceed with EGD if agreeable EGD can also be pursued as outpatient once Stephen [name] is more medically stable.  Recommend conservative management at this time  Continue pantoprazole 40 mg IV twice daily Continue sucralfate 1 g 3-4 times daily Keep head of the bed elevated Okay with mechanical soft diet GI will sign off, please call us back with questions or concerns   LOS: 5 days   Stephen [name] 06/17/2019, 1:30 PM

## 2019-06-17 NOTE — Consult Note (Signed)
PHARMACY CONSULT NOTE - FOLLOW UP  Pharmacy Consult for Electrolyte Monitoring and Replacement   Recent Labs: Potassium (mmol/L)  Date Value  06/17/2019 6.2 (H)  06/17/2019 6.2 (H)   Magnesium (mg/dL)  Date Value  06/14/2019 2.3   Calcium (mg/dL)  Date Value  06/17/2019 9.7  06/17/2019 9.7   Albumin (g/dL)  Date Value  06/17/2019 3.3 (L)  03/17/2019 4.3   Phosphorus (mg/dL)  Date Value  06/17/2019 4.8 (H)   Sodium (mmol/L)  Date Value  06/17/2019 133 (L)  06/17/2019 133 (L)  05/15/2019 137     Assessment: Patient presents to the ED with a complaint of shortness of breath and chest discomfort.  He also states he has had some abdominal swelling and recent increase in weight of about 8 pounds.  He has a known history of heart failure and has been in our facility somewhat frequently lately. Started on furosemide 20 mg IV q12H. On spironolactone 12.5 mg daily- d/c'ed due to elevated potassium.   Goal of Therapy:  K+ 4-5, Mg 2- 2.4   Plan:  6/17 K 6.2, Lokelma dose increased to 10g daily. Will continue to follow.   Paulina Fusi, PharmD, BCPS 06/17/2019 10:02 AM

## 2019-06-17 NOTE — Progress Notes (Addendum)
Pharmacy Antibiotic Note  Stephen [name] is a 53 y.o. male admitted on 06/12/2019 with pneumonia.  Pharmacy has been consulted for Vancomycin and Cefepime dosing. Patient's renal function continues to trend upward.  Plan: Cefepime 1g IV q12h  Vancomycin 2000mg  IV loading dose. Will check a random Vancomycin level 24 hours after dose. With unstable renal function will need to use levels to guide therapy.  MRSA PCR is negative, per discussion with Dr. Margaretmary Eddy will discontinue Vancomycin.  Height: 6' 1.5" (186.7 cm) Weight: 219 lb 11.2 oz (99.7 kg) IBW/kg (Calculated) : 81.05  Temp (24hrs), Avg:98.5 F (36.9 C), Min:98.3 F (36.8 C), Max:98.9 F (37.2 C)  Recent Labs  Lab 06/12/19 1818 06/13/19 0050 06/14/19 0447 06/15/19 0504 06/16/19 0343 06/17/19 0249  WBC 8.1 7.5  --   --  11.6*  --   CREATININE 1.91* 1.99* 1.80* 2.28* 3.29* 4.17*  4.06*    Estimated Creatinine Clearance: 26 mL/min (A) (by C-G formula based on SCr of 4.06 mg/dL (H)).    Allergies  Allergen Reactions  . Lisinopril Cough    Antimicrobials this admission: Vancomycin 6/17 >>  Cefepime 6/17 >>   Thank you for allowing pharmacy to be a part of this patient's care.  Paulina Fusi, PharmD, BCPS 06/17/2019 9:24 AM

## 2019-06-18 ENCOUNTER — Encounter
Admission: EM | Disposition: A | Discharge status: Home / Self Care | Payer: Self-pay | Source: Home / Self Care | Attending: Internal Medicine

## 2019-06-18 DIAGNOSIS — N179 Acute kidney failure, unspecified: Secondary | ICD-10-CM

## 2019-06-18 DIAGNOSIS — Z7982 Long term (current) use of aspirin: Secondary | ICD-10-CM

## 2019-06-18 DIAGNOSIS — I129 Hypertensive chronic kidney disease with stage 1 through stage 4 chronic kidney disease, or unspecified chronic kidney disease: Secondary | ICD-10-CM

## 2019-06-18 DIAGNOSIS — Z79899 Other long term (current) drug therapy: Secondary | ICD-10-CM

## 2019-06-18 DIAGNOSIS — E785 Hyperlipidemia, unspecified: Secondary | ICD-10-CM

## 2019-06-18 DIAGNOSIS — N189 Chronic kidney disease, unspecified: Secondary | ICD-10-CM

## 2019-06-18 DIAGNOSIS — Z87891 Personal history of nicotine dependence: Secondary | ICD-10-CM

## 2019-06-18 LAB — BASIC METABOLIC PANEL
Anion gap: 15 (ref 5–15)
BUN: 93 mg/dL — ABNORMAL HIGH (ref 6–20)
CO2: 19 mmol/L — ABNORMAL LOW (ref 22–32)
Calcium: 9.4 mg/dL (ref 8.9–10.3)
Chloride: 97 mmol/L — ABNORMAL LOW (ref 98–111)
Creatinine, Ser: 3.99 mg/dL — ABNORMAL HIGH (ref 0.61–1.24)
GFR calc Af Amer: 18 mL/min — ABNORMAL LOW (ref >60)
GFR calc non Af Amer: 16 mL/min — ABNORMAL LOW (ref >60)
Glucose, Bld: 152 mg/dL — ABNORMAL HIGH (ref 70–99)
Potassium: 5.3 mmol/L — ABNORMAL HIGH (ref 3.5–5.1)
Sodium: 131 mmol/L — ABNORMAL LOW (ref 135–145)

## 2019-06-18 LAB — CBC
HCT: 36.9 % — ABNORMAL LOW (ref 39.0–52.0)
Hemoglobin: 12.1 g/dL — ABNORMAL LOW (ref 13.0–17.0)
MCH: 28.3 pg (ref 26.0–34.0)
MCHC: 32.8 g/dL (ref 30.0–36.0)
MCV: 86.4 fL (ref 80.0–100.0)
Platelets: 250 10*3/uL (ref 150–400)
RBC: 4.27 MIL/uL (ref 4.22–5.81)
RDW: 16.8 % — ABNORMAL HIGH (ref 11.5–15.5)
WBC: 11.2 10*3/uL — ABNORMAL HIGH (ref 4.0–10.5)
nRBC: 0.5 % — ABNORMAL HIGH (ref 0.0–0.2)

## 2019-06-18 LAB — CULTURE, RESPIRATORY W GRAM STAIN
Culture: NORMAL
Special Requests: NORMAL

## 2019-06-18 LAB — HEPATITIS B SURFACE ANTIBODY,QUALITATIVE: Hep B S Ab: NONREACTIVE

## 2019-06-18 LAB — HEPATITIS B CORE ANTIBODY, IGM: Hep B C IgM: NEGATIVE

## 2019-06-18 LAB — HEPATITIS B SURFACE ANTIGEN: Hepatitis B Surface Ag: NEGATIVE

## 2019-06-18 SURGERY — DIALYSIS/PERMA CATHETER INSERTION
Anesthesia: Choice

## 2019-06-18 MED ORDER — DICLOFENAC SODIUM 1 % TD GEL
2.0000 g | Freq: Four times a day (QID) | TRANSDERMAL | Status: DC
  Administered 2019-06-18 – 2019-06-21 (×9): 2 g via TOPICAL
  Filled 2019-06-18 (×2): qty 100

## 2019-06-18 MED ORDER — ALUM & MAG HYDROXIDE-SIMETH 200-200-20 MG/5ML PO SUSP
30.0000 mL | ORAL | Status: DC | PRN
  Administered 2019-06-18: 30 mL via ORAL
  Filled 2019-06-18 (×2): qty 30

## 2019-06-18 MED ORDER — ALUM & MAG HYDROXIDE-SIMETH 200-200-20 MG/5ML PO SUSP
30.0000 mL | Freq: Once | ORAL | Status: AC
  Administered 2019-06-18: 30 mL via ORAL
  Filled 2019-06-18: qty 30

## 2019-06-18 MED ORDER — RENA-VITE PO TABS
1.0000 | ORAL_TABLET | Freq: Every day | ORAL | Status: DC
  Administered 2019-06-18 – 2019-06-25 (×8): 1 via ORAL
  Filled 2019-06-18 (×9): qty 1

## 2019-06-18 MED ORDER — IPRATROPIUM-ALBUTEROL 0.5-2.5 (3) MG/3ML IN SOLN
3.0000 mL | Freq: Four times a day (QID) | RESPIRATORY_TRACT | Status: DC
  Administered 2019-06-18 – 2019-06-24 (×17): 3 mL via RESPIRATORY_TRACT
  Filled 2019-06-18 (×22): qty 3

## 2019-06-18 MED ORDER — FUROSEMIDE 10 MG/ML IJ SOLN
60.0000 mg | Freq: Once | INTRAMUSCULAR | Status: AC
  Administered 2019-06-18: 60 mg via INTRAVENOUS
  Filled 2019-06-18: qty 6

## 2019-06-18 NOTE — Progress Notes (Signed)
[facility]  ROUNDING NOTE   Subjective:   Hypotensive 99/83  UOP 520  Creatinine 3.99 (4.17) K 5.3 (6.2)  Objective:  Vital signs in last 24 hours:  Temp:  [97.7 F (36.5 C)-98.4 F (36.9 C)] 98.4 F (36.9 C) (06/18 0755) Pulse Rate:  [75-81] 78 (06/18 0755) Resp:  [19-20] 19 (06/18 0755) BP: (91-138)/(73-117) 99/83 (06/18 1117) SpO2:  [85 %-98 %] 98 % (06/18 0755) Weight:  [99.8 kg] 99.8 kg (06/18 0439)  Weight change: 0.145 kg Filed Weights   06/16/19 0313 06/17/19 0525 06/18/19 0439  Weight: 97.7 kg 99.7 kg 99.8 kg    Intake/Output: I/O last 3 completed shifts: In: 6 [I.V.:6] Out: 42 [Urine:820]   Intake/Output this shift:  Total I/O In: -  Out: 480 [Urine:480]  Physical Exam: General: NAD,   Head: Normocephalic, atraumatic. Moist oral mucosal membranes  Eyes: Anicteric, PERRL  Neck: Supple, trachea midline  Lungs:  Crackles at bases Right > Left  Heart: Regular rate and rhythm  Abdomen:  Soft, nontender,   Extremities: no peripheral edema.  Neurologic: Nonfocal, moving all four extremities  Skin: No lesions       Basic Metabolic Panel: Recent Labs  Lab 06/13/19 0641 06/14/19 0447 06/15/19 0503 06/15/19 0504 06/16/19 0343 06/17/19 0249 06/18/19 0415  NA  --  140  --  140 137 133*  133* 131*  K  --  3.6 5.2* 5.2* 5.6* 6.2*  6.2* 5.3*  CL  --  105  --  107 102 97*  97* 97*  CO2  --  22  --  21* 22 21*  21* 19*  GLUCOSE  --  112*  --  142* 156* 144*  144* 152*  BUN  --  32*  --  36* 51* 74*  72* 93*  CREATININE  --  1.80*  --  2.28* 3.29* 4.17*  4.06* 3.99*  CALCIUM  --  9.5  --  9.7 9.7 9.7  9.7 9.4  MG 2.2 2.3  --   --   --   --   --   PHOS  --   --   --   --   --  4.8*  --     Liver Function Tests: Recent Labs  Lab 06/12/19 1818 06/17/19 0249  AST 14*  --   ALT 14  --   ALKPHOS 104  --   BILITOT 0.7  --   PROT 7.5  --   ALBUMIN 3.5 3.3*   Recent Labs  Lab 06/12/19 1818  LIPASE 20   No results for  input(s): AMMONIA in the last 168 hours.  CBC: Recent Labs  Lab 06/12/19 1818 06/13/19 0050 06/15/19 1901 06/16/19 0343 06/17/19 2107 06/18/19 0415  WBC 8.1 7.5  --  11.6*  --  11.2*  HGB 14.3 13.4 13.5 13.2 12.0* 12.1*  HCT 44.2 41.4 42.1 41.6 36.6* 36.9*  MCV 88.0 87.5  --  90.4  --  86.4  PLT 384 337  --  295  --  250    Cardiac Enzymes: Recent Labs  Lab 06/12/19 1818 06/13/19 0050 06/13/19 0641  TROPONINI 0.07* 0.10* 0.12*    BNP: Invalid input(s): POCBNP  CBG: Recent Labs  Lab 06/15/19 0237  GLUCAP 180*    Microbiology: Results for orders placed or performed during the hospital encounter of 06/12/19  SARS Coronavirus 2 (CEPHEID- Performed in [place] hospital lab), Hosp Order     Status: None   Collection Time: 06/12/19  9:52 PM   Specimen: Nasopharyngeal Swab  Result Value Ref Range Status   SARS Coronavirus 2 NEGATIVE NEGATIVE Final    Comment: (NOTE) If result is NEGATIVE SARS-CoV-2 target nucleic acids are NOT DETECTED. The SARS-CoV-2 RNA is generally detectable in upper and lower  respiratory specimens during the acute phase of infection. The lowest  concentration of SARS-CoV-2 viral copies this assay can detect is 250  copies / mL. A negative result does not preclude SARS-CoV-2 infection  and should not be used as the sole basis for treatment or other  patient management decisions.  A negative result may occur with  improper specimen collection / handling, submission of specimen other  than nasopharyngeal swab, presence of viral mutation(s) within the  areas targeted by this assay, and inadequate number of viral copies  (<250 copies / mL). A negative result must be combined with clinical  observations, patient history, and epidemiological information. If result is POSITIVE SARS-CoV-2 target nucleic acids are DETECTED. The SARS-CoV-2 RNA is generally detectable in upper and lower  respiratory specimens dur ing the acute phase of infection.   Positive  results are indicative of active infection with SARS-CoV-2.  Clinical  correlation with patient history and other diagnostic information is  necessary to determine patient infection status.  Positive results do  not rule out bacterial infection or co-infection with other viruses. If result is PRESUMPTIVE POSTIVE SARS-CoV-2 nucleic acids MAY BE PRESENT.   A presumptive positive result was obtained on the submitted specimen  and confirmed on repeat testing.  While 2019 novel coronavirus  (SARS-CoV-2) nucleic acids may be present in the submitted sample  additional confirmatory testing may be necessary for epidemiological  and / or clinical management purposes  to differentiate between  SARS-CoV-2 and other Sarbecovirus currently known to infect humans.  If clinically indicated additional testing with an alternate test  methodology [phone]) is advised. The SARS-CoV-2 RNA is generally  detectable in upper and lower respiratory sp ecimens during the acute  phase of infection. The expected result is Negative. Fact Sheet for Patients:  StrictlyIdeas.no Fact Sheet for Healthcare Providers: BankingDealers.co.za This test is not yet approved or cleared by the [place] FDA and has been authorized for detection and/or diagnosis of SARS-CoV-2 by FDA under an Emergency Use Authorization (EUA).  This EUA will remain in effect (meaning this test can be used) for the duration of the COVID-19 declaration under Section 564(b)(1) of the Act, 21 U.S.C. section 360bbb-3(b)(1), unless the authorization is terminated or revoked sooner. Performed at [facility], [address] [place], [place] [address]   Expectorated sputum assessment w rflx to resp cult     Status: None   Collection Time: 06/15/19 10:49 PM   Specimen: Expectorated Sputum  Result Value Ref Range Status   Specimen Description EXPECTORATED SPUTUM  Final   Special  Requests Normal  Final   Sputum evaluation   Final    THIS SPECIMEN IS ACCEPTABLE FOR SPUTUM CULTURE Performed at [facility], [address], [place], [place] [address]    Report Status 06/16/2019 FINAL  Final  Culture, respiratory     Status: None   Collection Time: 06/15/19 10:49 PM  Result Value Ref Range Status   Specimen Description   Final    EXPECTORATED SPUTUM Performed at [facility], [address], [place],  [address]    Special Requests   Final    Normal Reflexed from [id number] Performed at [facility], [address] [place], [place] [address]    Gram Stain   Final    MODERATE WBC PRESENT, PREDOMINANTLY PMN ABUNDANT GRAM POSITIVE COCCI ABUNDANT GRAM POSITIVE RODS    Culture   Final    ABUNDANT Consistent with normal respiratory flora. Performed at [facility], [place] [address] [place], [place] [address]    Report Status 06/18/2019 FINAL  Final  MRSA PCR Screening     Status: None   Collection Time: 06/17/19 11:20 AM   Specimen: Nasopharyngeal  Result Value Ref Range Status   MRSA by PCR NEGATIVE NEGATIVE Final    Comment:        The GeneXpert MRSA Assay (FDA approved for NASAL specimens only), is one component of a comprehensive MRSA colonization surveillance program. It is not intended to diagnose MRSA infection nor to guide or monitor treatment for MRSA infections. Performed at [facility], [address] [place], [place] [address]     Coagulation Studies: No results for input(s): LABPROT, INR in the last 72 hours.  Urinalysis: No results for input(s): COLORURINE, LABSPEC, PHURINE, GLUCOSEU, HGBUR, BILIRUBINUR, KETONESUR, PROTEINUR, UROBILINOGEN, NITRITE, LEUKOCYTESUR in the last 72 hours.  Invalid input(s): APPERANCEUR    Imaging: Dg Ugi W Single Cm (sol Or Thin Ba)  Result Date: 06/17/2019 CLINICAL DATA:  Nausea vomiting. EXAM: UPPER GI SERIES WITH KUB TECHNIQUE: After obtaining a scout  radiograph a routine upper GI series was performed using thin density barium FLUOROSCOPY TIME:  Fluoroscopy Time:  1 minutes 30 seconds Radiation Exposure Index (if provided by the fluoroscopic device): 50.9 mGy Number of Acquired Spot Images: 17 COMPARISON:  Abdomen series 06/14/2019. FINDINGS: KUB reveals cardiomegaly and right lower lobe infiltrate. Right lower lobe infiltrate would be consistent with pneumonia. Limited exam due to patient vomiting. Persistent lung smooth narrowing noted of the distal third of the thoracic esophagus. Endoscopic evaluation should be considered stomach appears normal. Duodenum appears normal. C-loop appears normal. No definite reflux noted. IMPRESSION: 1.  Cardiomegaly. 2.  Right lower lobe infiltrates consistent with pneumonia. 3. Long smooth narrowing over the distal third of the thoracic esophagus. Endoscopic evaluation should be considered. 4. Stomach and duodenum appear normal. This exam was limited due to patient vomiting. Electronically Signed   By: Marcello Moores  Register   On: 06/17/2019 08:37     Medications:   . sodium chloride 20 mL (06/17/19 1117)  . ceFEPime (MAXIPIME) IV 1 g (06/18/19 1126)   . amiodarone  200 mg Oral BID  . aspirin EC  81 mg Oral Daily  . atorvastatin  40 mg Oral q1800  . clopidogrel  75 mg Oral Daily  . feeding supplement (ENSURE ENLIVE)  237 mL Oral BID BM  . heparin injection (subcutaneous)  5,000 Units Subcutaneous Q8H  . ipratropium-albuterol  3 mL Nebulization Q6H  . linaclotide  145 mcg Oral QAC breakfast  . methimazole  40 mg Oral Daily  . mometasone-formoterol  2 puff Inhalation BID  . multivitamin  1 tablet Oral QHS  . multivitamin with minerals  1 tablet Oral Daily  . pantoprazole (PROTONIX) IV  40 mg Intravenous Q12H  . polyethylene glycol  17 g Oral Daily  . senna-docusate  1 tablet Oral Daily  . sodium chloride flush  10-40 mL Intracatheter Q12H  . sodium chloride flush  3 mL Intravenous Q12H  . sodium zirconium  cyclosilicate  10 g Oral BID  . tiotropium  1 capsule Inhalation Daily   sodium chloride, acetaminophen **OR** acetaminophen, guaiFENesin-dextromethorphan, ondansetron **  OR** ondansetron (ZOFRAN) IV, promethazine, sodium chloride flush  Assessment/ Plan:  [name] is a 53 y.o. black male withchronic systolic heart failure ejection fraction 10-15%, prior history of cardiac arrest, hyperlipidemia, nephrolithiasis, hypertension, history of TIA, was admitted to [facility] on[facility]/12/2019  for acute exacerbation of systolic congestive heart failure, EF 10%  1. Acute renal failure with hyperkalemia and metabolic acidosis onChronic kidney disease stage III baseline creatinine 1.85, GFR of 47 on 01/24/19. Bland urine.  Concerning for pulmonary edema and worsening renal function and hyperkalemia.  Hyperkalemia could partially be due to spironolactone.  - Patient refusing dialysis. Will continue medical management but patient may not have a choice if renal function continues to deteriorate - Lokelma  - Furosemide IV bolus today, 60mg   2. Hypotension with systolic congestive heart failure:   - Discontinue carvedilol - Appreciate cardiology input.     LOS: 6 Gal Feldhaus 6/18/20202:46 PM

## 2019-06-18 NOTE — Progress Notes (Signed)
Nutrition Follow Up Note RD working remotely.  DOCUMENTATION CODES:   Not applicable  INTERVENTION:   Continue Ensure Enlive po BID, each supplement provides 350 kcal and 20 grams of protein. Patient prefers strawberry.  Continue daily MVI.  Add Rena-vite daily   NUTRITION DIAGNOSIS:   Inadequate oral intake related to decreased appetite, nausea, vomiting as evidenced by per patient/family report.  GOAL:   Patient will meet greater than or equal to 90% of their needs -not met   MONITOR:   PO intake, Supplement acceptance, Labs, Weight trends, I & O's  ASSESSMENT:   53 year old male with PMHx of HTN, hx TIA, CHF, s/p placement of AICD, COPD, CKD III, arthritis, gout admitted with intractable nausea and vomiting, acute on chronic systolic CHF.   Pt with improving nausea and vomiting. Pt advanced to a clear liquid diet on 5/15; pt tolerating clear liquids well and was eating 100% of meals prior to NPO. Pt currently NPO for perm cath today; pt initiating HD. Pt refusing most of the Ensure; RD will leave this ordered as he reports that he drinks this at home. RD will add Rena-vite to replace losses from HD. Per chart, pt with 2lb weight gain since admit.   Medications reviewed and include: aspirin, plavix, heparin, tapazole, MVI, protonix, miralax, senokot, lokelma, cefepime    Labs reviewed: Na 131(L), K 5.3(H), BUN 93(H), creat 3.99(H) Wbc- 11.2(H)  Diet Order:   Diet Order            Diet NPO time specified  Diet effective midnight             EDUCATION NEEDS:   No education needs have been identified at this time  Skin:  Skin Assessment: Reviewed RN Assessment  Last BM:  6/17  Height:   Ht Readings from Last 1 Encounters:  06/12/19 6' 1.5" (1.867 m)   Weight:   Wt Readings from Last 1 Encounters:  06/18/19 99.8 kg   Ideal Body Weight:  85 kg  BMI:  Body mass index is 28.63 kg/m.  Estimated Nutritional Needs:   Kcal:  6945-0388  Protein:   115-125 grams  Fluid:  2 L/day  Koleen Distance MS, RD, LDN Pager #- [phone] Office#- [phone] After Hours Pager: [phone]

## 2019-06-18 NOTE — Progress Notes (Signed)
[facility] [place] NAME: Stephen [name]    MR#:  [id number]  DATE OF BIRTH:  December 10, 1966  SUBJECTIVE:  CHIEF COMPLAINT: Patient denies any vomiting today.  Feels better as renal function is slightly better today and hoping that he does not need dialysis  REVIEW OF SYSTEMS:  CONSTITUTIONAL: No fever, fatigue or weakness.  EYES: No blurred or double vision.  EARS, NOSE, AND THROAT: No tinnitus or ear pain.  RESPIRATORY: No cough, improving shortness of breath, denies wheezing or hemoptysis.  CARDIOVASCULAR: No chest pain, orthopnea, edema.  GASTROINTESTINAL: Reporting nausea, vomiting, denies diarrhea or abdominal pain.  GENITOURINARY: No dysuria, hematuria.  ENDOCRINE: No polyuria, nocturia,  HEMATOLOGY: No anemia, easy bruising or bleeding SKIN: No rash or lesion. MUSCULOSKELETAL: No joint pain or arthritis.   NEUROLOGIC: No tingling, numbness, weakness.  PSYCHIATRY: No anxiety or depression.   DRUG ALLERGIES:   Allergies  Allergen Reactions  . Lisinopril Cough    VITALS:  Blood pressure 101/84, pulse 75, temperature 97.6 F (36.4 C), temperature source Oral, resp. rate 19, height 6' 1.5" (1.867 m), weight 99.8 kg, SpO2 93 %.  PHYSICAL EXAMINATION:  GENERAL:  53 y.o.-year-old patient lying in the bed with no acute distress.  EYES: Pupils equal, round, reactive to light and accommodation. No scleral icterus. Extraocular muscles intact.  HEENT: Head atraumatic, normocephalic. Oropharynx and nasopharynx clear.  NECK:  Supple, no jugular venous distention. No thyroid enlargement, no tenderness.  LUNGS: Normal breath sounds bilaterally, no wheezing, rales,rhonchi or crepitation. No use of accessory muscles of respiration.  CARDIOVASCULAR: S1, S2 normal. No murmurs, rubs, or gallops.  ABDOMEN: Soft, nontender, nondistended. Bowel sounds present. EXTREMITIES: No pedal edema, cyanosis, or clubbing.  NEUROLOGIC: Cranial nerves II  through XII are intact. Muscle strength at his baseline in all extremities. Sensation intact. Gait not checked.  PSYCHIATRIC: The patient is alert and oriented x 3.  SKIN: No obvious rash, lesion, or ulcer.    LABORATORY PANEL:   CBC Recent Labs  Lab 06/18/19 0415  WBC 11.2*  HGB 12.1*  HCT 36.9*  PLT 250   ------------------------------------------------------------------------------------------------------------------  Chemistries  Recent Labs  Lab 06/12/19 1818  06/14/19 0447  06/18/19 0415  NA 134*   < > 140   < > 131*  K 4.8   < > 3.6   < > 5.3*  CL 103   < > 105   < > 97*  CO2 19*   < > 22   < > 19*  GLUCOSE 172*   < > 112*   < > 152*  BUN 27*   < > 32*   < > 93*  CREATININE 1.91*   < > 1.80*   < > 3.99*  CALCIUM 9.4   < > 9.5   < > 9.4  MG  --    < > 2.3  --   --   AST 14*  --   --   --   --   ALT 14  --   --   --   --   ALKPHOS 104  --   --   --   --   BILITOT 0.7  --   --   --   --    < > = values in this interval not displayed.   ------------------------------------------------------------------------------------------------------------------  Cardiac Enzymes Recent Labs  Lab 06/13/19 0641  TROPONINI 0.12*   ------------------------------------------------------------------------------------------------------------------  RADIOLOGY:  Dg Paulene Floor Single Cm (  sol Or Thin Ba)  Result Date: 06/17/2019 CLINICAL DATA:  Nausea vomiting. EXAM: UPPER GI SERIES WITH KUB TECHNIQUE: After obtaining a scout radiograph a routine upper GI series was performed using thin density barium FLUOROSCOPY TIME:  Fluoroscopy Time:  1 minutes 30 seconds Radiation Exposure Index (if provided by the fluoroscopic device): 50.9 mGy Number of Acquired Spot Images: 17 COMPARISON:  Abdomen series 06/14/2019. FINDINGS: KUB reveals cardiomegaly and right lower lobe infiltrate. Right lower lobe infiltrate would be consistent with pneumonia. Limited exam due to patient vomiting. Persistent lung  smooth narrowing noted of the distal third of the thoracic esophagus. Endoscopic evaluation should be considered stomach appears normal. Duodenum appears normal. C-loop appears normal. No definite reflux noted. IMPRESSION: 1.  Cardiomegaly. 2.  Right lower lobe infiltrates consistent with pneumonia. 3. Long smooth narrowing over the distal third of the thoracic esophagus. Endoscopic evaluation should be considered. 4. Stomach and duodenum appear normal. This exam was limited due to patient vomiting. Electronically Signed   By: Marcello Moores  Register   On: 06/17/2019 08:37    EKG:   Orders placed or performed during the hospital encounter of 06/12/19  . EKG 12-Lead  . EKG 12-Lead  . ED EKG  . ED EKG  . EKG 12-Lead  . EKG 12-Lead  . EKG 12-Lead  . EKG 12-Lead    ASSESSMENT AND PLAN:   #AK I -chronic kidney disease stage III-secondary to overdiuresis and GI losses-cardiorenal syndrome  monitoring of renal function and avoid nephrotoxins  Baseline creatinine 1.9 Creatinine at 2.28 -3.29 -4.17-3.99 Hydrated with very gentle IV fluids briefly and discontinued as patient felt dyspneic.  Spironolactone given Nephrology is recommending dialysis as renal function is getting worse.  Patient would like to wait until tomorrow to see if renal function gets better Vascular surgery has seen the patient   #Intractable nausea and vomiting-could be from GERD PPI twice daily, Carafate 1 g 4 times a day Negative H. pylori stool test Patient is not a good candidate for endoscopy given his cardiac history -Per previous records Discussed with Dr. Marius Ditch not considering any procedures at this point of time Appreciate the recommendations.  GI signed off  #Hyperkalemia Lokelma given potassium at 5.3 continue monitoring  # Acute on chronic systolic congestive heart failure with history of cardiomyopathy- nonischemic, ejection fraction around 10 percent with AICD Monitor patient on telemetry Lasix 60 mg IV dose  given today  Troponin 0 0.07-0.10-0.12 Seen by cardiology [name].  Appreciate his recommendations.  [name] has discussed with the family members  considering AICD interrogation Daily weights, intake and output Continue home medication aspirin 81 mg, Plavix, Coreg, statin Continue amiodarone 200 mg 2 times a day Goal is to keep his potassium at 4 and magnesium at 2.0   #COPD chronic no exacerbation Continue bronchodilators  #Essential hypertension Currently patient is hypotensive hold blood pressure medicines   #Hyperlipidemia continue home dose statin  DVT prophylaxis with Lovenox subcu renal dose adjusted All the records are reviewed and case discussed with Care Management/Social Workerr. Management plans discussed with the patient, spoke to patient's sister over phone.  They both verbalized understanding of the plan  CODE STATUS: fc   TOTAL TIME TAKING CARE OF THIS PATIENT: 36  minutes.   POSSIBLE D/C IN 1-2 DAYS, DEPENDING ON CLINICAL CONDITION.  Note: This dictation was prepared with Dragon dictation along with smaller phrase technology. Any transcriptional errors that result from this process are unintentional.   [name] M.D on 06/18/2019  at 5:52 PM  Between 7am to 6pm - Pager - [phone] After 6pm go to [email or web address] - password [email or web address] [facility]  Office  [phone]  CC: Primary care physician; Valerie Roys, DO

## 2019-06-18 NOTE — Consult Note (Signed)
[facility] Vascular Consult Note  MRN : [id number]  Stephen [name] is a 53 y.o. (Feb 26, 1966) male who presents with chief complaint of  Chief Complaint  Patient presents with  . Shortness of Breath  . Chest Pain   History of Present Illness:  The patient is a 53 year old male with a past medical history of TIA (2017), nonischemic cardiomyopathy, hypertension, kidney stones, hyperlipidemia, gout, congestive heart failure, cardiac arrest (2018), AICD present, COPD, and chronic kidney disease who presented to the [facility] emergency department with a chief complaint of progressively worsening shortness of breath on June 12, 2019.  The patient endorses a history of an 8 pound weight gain as well as progressively worsening chest pain and shortness of breath over the last few weeks.  He has been seen in the emergency department and a heart failure clinic and his diuretic medication has been increased.  Patient with known ejection fraction of 10 to 50%.  Chronic kidney disease stage III baseline creatinine 1.85, GFR of 47 on 01/24/19. During the patient's inpatient stay his creatinine / BUN was noted to worsen.  Vascular surgery was consulted by nephrology, Dr. Juleen China for insertion of a PermCath Current Facility-Administered Medications  Medication Dose Route Frequency Provider Last Rate Last Dose  . 0.9 %  sodium chloride infusion   Intravenous PRN Lance Coon, MD 10 mL/hr at 06/17/19 1117 20 mL at 06/17/19 1117  . acetaminophen (TYLENOL) tablet 650 mg  650 mg Oral Q6H PRN Lance Coon, MD       Or  . acetaminophen (TYLENOL) suppository 650 mg  650 mg Rectal Q6H PRN Lance Coon, MD      . amiodarone (PACERONE) tablet 200 mg  200 mg Oral BID Lance Coon, MD   200 mg at 06/17/19 2200  . aspirin EC tablet 81 mg  81 mg Oral Daily Lance Coon, MD   81 mg at 06/17/19 0903  . atorvastatin (LIPITOR) tablet 40 mg  40 mg Oral q1800 Lance Coon, MD   40 mg at 06/17/19 1717  . ceFEPIme (MAXIPIME) 1 g in sodium chloride 0.9 % 100 mL IVPB  1 g Intravenous Q12H [name], Aruna, MD 200 mL/hr at 06/17/19 2158 1 g at 06/17/19 2158  . clopidogrel (PLAVIX) tablet 75 mg  75 mg Oral Daily Lance Coon, MD   75 mg at 06/17/19 0902  . feeding supplement (ENSURE ENLIVE) (ENSURE ENLIVE) liquid 237 mL  237 mL Oral BID BM [name], Aruna, MD   237 mL at 06/17/19 2156  . furosemide (LASIX) injection 60 mg  60 mg Intravenous Once Kolluru, Sarath, MD      . guaiFENesin-dextromethorphan (ROBITUSSIN DM) 100-10 MG/5ML syrup 5 mL  5 mL Oral Q4H PRN [name], Aruna, MD   5 mL at 06/14/19 2247  . heparin injection 5,000 Units  5,000 Units Subcutaneous Q8H [name], MD   Stopped at 06/17/19 2200  . ipratropium-albuterol (DUONEB) 0.5-2.5 (3) MG/3ML nebulizer solution 3 mL  3 mL Nebulization Q6H Seals, Angela H, NP   3 mL at 06/18/19 0759  . linaclotide (LINZESS) capsule 145 mcg  145 mcg Oral QAC breakfast Lin Landsman, MD   145 mcg at 06/17/19 0903  . methimazole (TAPAZOLE) tablet 40 mg  40 mg Oral Daily Lance Coon, MD   40 mg at 06/15/19 0902  . mometasone-formoterol (DULERA) 200-5 MCG/ACT inhaler 2 puff  2 puff Inhalation BID Lance Coon, MD   2 puff at 06/17/19 2157  .  multivitamin (RENA-VIT) tablet 1 tablet  1 tablet Oral QHS [name], Aruna, MD      . multivitamin with minerals tablet 1 tablet  1 tablet Oral Daily [name], Aruna, MD   1 tablet at 06/16/19 0858  . ondansetron (ZOFRAN) tablet 4 mg  4 mg Oral Q6H PRN Lance Coon, MD       Or  . ondansetron [facility]) injection 4 mg  4 mg Intravenous Q6H PRN Lance Coon, MD   4 mg at 06/16/19 2130  . pantoprazole (PROTONIX) injection 40 mg  40 mg Intravenous Q12H Lin Landsman, MD   40 mg at 06/17/19 2200  . polyethylene glycol (MIRALAX / GLYCOLAX) packet 17 g  17 g Oral Daily [name], Aruna, MD   17 g at 06/17/19 0905  . promethazine (PHENERGAN) injection 12.5-25 mg  12.5-25 mg Intravenous Q6H PRN  Lance Coon, MD   12.5 mg at 06/15/19 0257  . senna-docusate (Senokot-S) tablet 1 tablet  1 tablet Oral Daily [name], Aruna, MD   1 tablet at 06/17/19 0901  . sodium chloride flush (NS) 0.9 % injection 10-40 mL  10-40 mL Intracatheter Q12H Kolluru, Sarath, MD      . sodium chloride flush (NS) 0.9 % injection 10-40 mL  10-40 mL Intracatheter PRN Kolluru, Sarath, MD      . sodium chloride flush (NS) 0.9 % injection 3 mL  3 mL Intravenous Q12H [name], Aruna, MD   3 mL at 06/17/19 2201  . sodium zirconium cyclosilicate (LOKELMA) packet 10 g  10 g Oral BID Kolluru, Sarath, MD   10 g at 06/17/19 2219  . tiotropium (SPIRIVA) inhalation capsule ([facility] use ONLY) 18 mcg  1 capsule Inhalation Daily Lance Coon, MD   18 mcg at 06/17/19 0240   Past Medical History:  Diagnosis Date  . AICD (automatic cardioverter/defibrillator) present   . AKI (acute kidney injury) ([place]) 12/24/2017  . Arrhythmia   . Arthritis    "lower back, knees" (06/10/2018)  . Cardiac arrest ([place]) 12/23/2017   Brief V-fib arrest  . Chest pain 09/08/2017  . CHF (congestive heart failure) ([facility])    nonischemic cardiomyopathy, EF 25%  . Gout    "on daily RX" (06/10/2018)  . Headache    "q couple months" (06/10/2018)  . High cholesterol   . History of kidney stones   . Hypertension   . Influenza A 12/24/2017  . NICM (nonischemic cardiomyopathy) ([place])   . Pneumonia 12/20/2017  . TIA (transient ischemic attack) 06/21/2016   "still affects my memory a little bit" (06/10/2018)   Past Surgical History:  Procedure Laterality Date  . EXTERNAL FIXATION LEG Right ~ 2000   "was going bowlegged; had to brake my leg to fix it"  . HERNIA REPAIR    . ICD IMPLANT N/A 12/30/2017   Procedure: ICD IMPLANT;  Surgeon: Deboraha Sprang, MD;  Location: [facility];  Service: Cardiovascular;  Laterality: N/A;  . LEFT HEART CATH AND CORONARY ANGIOGRAPHY N/A 09/09/2017   Procedure: LEFT HEART CATH AND CORONARY ANGIOGRAPHY;  Surgeon: Teodoro Spray, MD;  Location: [facility];  Service: Cardiovascular;  Laterality: N/A;  . UMBILICAL HERNIA REPAIR  1990s  . V TACH ABLATION  06/10/2018  . V TACH ABLATION N/A 06/10/2018   Procedure: V TACH ABLATION;  Surgeon: Thompson Grayer, MD;  Location: [facility];  Service: Cardiovascular;  Laterality: N/A;  . VASECTOMY     Social History Social History   Tobacco Use  .  Smoking status: Former Smoker    Packs/day: 0.33    Years: 33.00    Pack years: 10.89    Types: Cigarettes    Quit date: 02/20/2016    Years since quitting: 3.3  . Smokeless tobacco: Never Used  Substance Use Topics  . Alcohol use: Yes    Alcohol/week: 0.0 standard drinks    Comment: 06/10/2018 "beer once/month; if that"  . Drug use: Never   Family History Family History  Problem Relation Age of Onset  . Hypertension Mother   . Heart failure Mother   . Hypertension Father   . CAD Father   . Heart attack Father   Denies family history of peripheral artery disease, renal disease and/or bleeding/clotting disorders.  Allergies  Allergen Reactions  . Lisinopril Cough   REVIEW OF SYSTEMS (Negative unless checked)  Constitutional: [] Weight loss  [] Fever  [] Chills Cardiac: [x] Chest pain   [x] Chest pressure   [] Palpitations   [] Shortness of breath when laying flat   [] Shortness of breath at rest   [x] Shortness of breath with exertion. Vascular:  [] Pain in legs with walking   [] Pain in legs at rest   [] Pain in legs when laying flat   [] Claudication   [] Pain in feet when walking  [] Pain in feet at rest  [] Pain in feet when laying flat   [] History of DVT   [] Phlebitis   [x] Swelling in legs   [] Varicose veins   [] Non-healing ulcers Pulmonary:   [] Uses home oxygen   [] Productive cough   [] Hemoptysis   [] Wheeze  [] COPD   [] Asthma Neurologic:  [] Dizziness  [] Blackouts   [] Seizures   [] History of stroke   [] History of TIA  [] Aphasia   [] Temporary blindness   [] Dysphagia   [] Weakness or numbness in arms   [] Weakness or  numbness in legs Musculoskeletal:  [] Arthritis   [] Joint swelling   [] Joint pain   [] Low back pain Hematologic:  [] Easy bruising  [] Easy bleeding   [] Hypercoagulable state   [] Anemic  [] Hepatitis Gastrointestinal:  [] Blood in stool   [] Vomiting blood  [] Gastroesophageal reflux/heartburn   [] Difficulty swallowing. Genitourinary:  [x] Chronic kidney disease   [] Difficult urination  [] Frequent urination  [] Burning with urination   [] Blood in urine Skin:  [] Rashes   [] Ulcers   [] Wounds Psychological:  [] History of anxiety   []  History of major depression.  Physical Examination  Vitals:   06/18/19 0439 06/18/19 0440 06/18/19 0600 06/18/19 0755  BP:  (!) 138/117  93/78  Pulse:  81  78  Resp:    19  Temp:   97.9 F (36.6 C) 98.4 F (36.9 C)  TempSrc:   Oral Oral  SpO2:  94%  98%  Weight: 99.8 kg     Height:       Body mass index is 28.63 kg/m. Gen:  WD/WN, NAD Head: [place]/AT, No temporalis wasting. Prominent temp pulse not noted. Ear/Nose/Throat: Hearing grossly intact, nares w/o erythema or drainage, oropharynx w/o Erythema/Exudate Eyes: Sclera non-icteric, conjunctiva clear Neck: Trachea midline.  No JVD.  Pulmonary:  Good air movement, respirations not labored, equal bilaterally.  Cardiac: RRR, normal S1, S2. Vascular:  Vessel Right Left  Radial Palpable Palpable  Ulnar Palpable Palpable  Brachial Palpable Palpable  Carotid Palpable, without bruit Palpable, without bruit  Aorta Not palpable N/A  Femoral Palpable Palpable  Popliteal Palpable Palpable  PT Palpable Palpable  DP Palpable Palpable   Gastrointestinal: soft, non-tender/non-distended. No guarding/reflex.  Musculoskeletal: M/S 5/5 throughout.  Extremities without ischemic changes.  No  deformity or atrophy. Mild edema. Neurologic: Sensation grossly intact in extremities.  Symmetrical.  Speech is fluent. Motor exam as listed above. Psychiatric: Judgment intact, Mood & affect appropriate for pt's clinical  situation. Dermatologic: No rashes or ulcers noted.  No cellulitis or open wounds. Lymph : No Cervical, Axillary, or Inguinal lymphadenopathy.  CBC Lab Results  Component Value Date   WBC 11.2 (H) 06/18/2019   HGB 12.1 (L) 06/18/2019   HCT 36.9 (L) 06/18/2019   MCV 86.4 06/18/2019   PLT 250 06/18/2019   BMET    Component Value Date/Time   NA 131 (L) 06/18/2019 0415   NA 137 05/15/2019 1346   K 5.3 (H) 06/18/2019 0415   CL 97 (L) 06/18/2019 0415   CO2 19 (L) 06/18/2019 0415   GLUCOSE 152 (H) 06/18/2019 0415   BUN 93 (H) 06/18/2019 0415   BUN 36 (H) 05/15/2019 1346   CREATININE 3.99 (H) 06/18/2019 0415   CALCIUM 9.4 06/18/2019 0415   GFRNONAA 16 (L) 06/18/2019 0415   GFRAA 18 (L) 06/18/2019 0415   Estimated Creatinine Clearance: 26.5 mL/min (A) (by C-G formula based on SCr of 3.99 mg/dL (H)).  COAG Lab Results  Component Value Date   INR 1.1 06/12/2019   INR 0.94 12/03/2018   INR 1.04 09/09/2017   Radiology Dg Chest 2 View  Result Date: 06/12/2019 CLINICAL DATA:  Chest pain EXAM: CHEST - 2 VIEW COMPARISON:  05/30/2019 FINDINGS: The heart size is enlarged. There is volume overload with findings of developing pulmonary edema. There are more focal areas of increased attenuation in the retrocardiac region and at the right lung base. No pneumothorax. A left-sided ICD is noted. There is no acute osseous abnormality. IMPRESSION: 1. Cardiomegaly with developing pulmonary edema. 2. More focal airspace opacities in the bilateral lower lung zones favored to represent sequela of developing pulmonary edema as opposed to a developing infectious process. Attention on follow-up examinations is recommended. Electronically Signed   By: Constance Holster M.D.   On: 06/12/2019 19:38   Dg Chest Port 1 View  Result Date: 05/30/2019 CLINICAL DATA:  Cough, shortness of breath, nausea and vomiting beginning this morning. EXAM: PORTABLE CHEST 1 VIEW COMPARISON:  PA and lateral chest 05/04/2019 and  02/13/2019. Single-view of the chest 05/02/2019. FINDINGS: There is marked cardiomegaly and mild interstitial edema. No consolidative process, pneumothorax or effusion. AICD is noted. No acute or focal bony abnormality. IMPRESSION: Cardiomegaly and mild interstitial edema appear unchanged compared to the most recent study. Electronically Signed   By: Inge Rise M.D.   On: 05/30/2019 16:29   Dg Abd Acute 2+v W 1v Chest  Result Date: 06/14/2019 CLINICAL DATA:  Shortness of breath. Intractable nausea and vomiting. New right lower abdominal pain. EXAM: DG ABDOMEN ACUTE W/ 1V CHEST COMPARISON:  Two-view chest x-ray 06/12/2019 FINDINGS: Heart is enlarged. Moderate pulmonary vascular congestion is present. There is slight increase in a diffuse interstitial pattern. Bibasilar opacities are present without significant consolidation. The bowel gas pattern is normal. Mild prominence of stool in the ascending colon and rectum. No obstruction is present. Degenerative changes are noted in the lower lumbar spine. IMPRESSION: 1. Stable cardiomegaly with increasing interstitial pattern consistent with worsening edema. 2. Infection versus atelectasis at the lung bases. 3. Normal bowel gas pattern.  No obstruction or free air. Electronically Signed   By: San Morelle M.D.   On: 06/14/2019 15:16   Dg Duanne Limerick W Single Cm (sol Or Thin Ba)  Result Date: 06/17/2019 CLINICAL DATA:  Nausea vomiting. EXAM: UPPER GI SERIES WITH KUB TECHNIQUE: After obtaining a scout radiograph a routine upper GI series was performed using thin density barium FLUOROSCOPY TIME:  Fluoroscopy Time:  1 minutes 30 seconds Radiation Exposure Index (if provided by the fluoroscopic device): 50.9 mGy Number of Acquired Spot Images: 17 COMPARISON:  Abdomen series 06/14/2019. FINDINGS: KUB reveals cardiomegaly and right lower lobe infiltrate. Right lower lobe infiltrate would be consistent with pneumonia. Limited exam due to patient vomiting. Persistent  lung smooth narrowing noted of the distal third of the thoracic esophagus. Endoscopic evaluation should be considered stomach appears normal. Duodenum appears normal. C-loop appears normal. No definite reflux noted. IMPRESSION: 1.  Cardiomegaly. 2.  Right lower lobe infiltrates consistent with pneumonia. 3. Long smooth narrowing over the distal third of the thoracic esophagus. Endoscopic evaluation should be considered. 4. Stomach and duodenum appear normal. This exam was limited due to patient vomiting. Electronically Signed   By: Marcello Moores  Register   On: 06/17/2019 08:37   Assessment/Plan The patient is a 53 year old male with a past medical history of TIA (2017), nonischemic cardiomyopathy, hypertension, kidney stones, hyperlipidemia, gout, congestive heart failure, cardiac arrest (2018), AICD present, COPD, and chronic kidney disease who presented to the [facility] emergency department with a chief complaint of progressively worsening shortness of breath on June 12, 2019. 1.  Acute on chronic kidney failure: Patient with worsening creatinine/BUN during his inpatient stay. Todays Creatinine / BUN: 3.99 / 93, Potassium 5.3.  Nephrology would like to initiate hemodialysis and attempt to help manage his fluid status/electrolyte abnormalities.  At this time, the patient does not have an adequate dialysis access to allow for this.  Recommend placing a PermCath so the patient can initiate dialysis as well as leave the hospital with a functioning access.  Procedure, risks and benefits explained to the patient.  All questions answered.  At this time, the patient is refusing to move forward with a PermCath insertion.  We will continue to follow if the patient changes his mind will be happy to place one.  2. Hyperlipidemia: On aspirin and statin for medical management. Encouraged good control as its slows the progression of atherosclerotic disease 3. Hypertension: On appropriate medications.  Encouraged good control as its slows the progression of atherosclerotic disease.  Discussed with Dew / Schnier  Sela Hua, PA-C  06/18/2019 11:02 AM  This note was created with Dragon medical transcription system.  Any error is purely unintentional

## 2019-06-18 NOTE — Consult Note (Signed)
PHARMACY CONSULT NOTE - FOLLOW UP  Pharmacy Consult for Electrolyte Monitoring and Replacement   Recent Labs: Potassium (mmol/L)  Date Value  06/18/2019 5.3 (H)   Magnesium (mg/dL)  Date Value  06/14/2019 2.3   Calcium (mg/dL)  Date Value  06/18/2019 9.4   Albumin (g/dL)  Date Value  06/17/2019 3.3 (L)  03/17/2019 4.3   Phosphorus (mg/dL)  Date Value  06/17/2019 4.8 (H)   Sodium (mmol/L)  Date Value  06/18/2019 131 (L)  05/15/2019 137     Assessment: Patient presents to the ED with a complaint of shortness of breath and chest discomfort.  He also states he has had some abdominal swelling and recent increase in weight of about 8 pounds.  He has a known history of heart failure and has been in our facility somewhat frequently lately. Started on furosemide 20 mg IV q12H, now holding lasix. On spironolactone 12.5 mg daily- d/c'ed due to elevated potassium. Phosphorus elevated as well. May need nephro consult or start phos blinder.   Goal of Therapy:  K+ 4-5, Mg 2- 2.4   Plan:  K+ trending down. At 5.3 today, Lokelma dose increased to 10g daily on 6/17. Will continue to follow.   Eleonore Chiquito , PharmD, BCPS 06/18/2019 8:35 AM

## 2019-06-19 LAB — RENAL FUNCTION PANEL
Albumin: 2.8 g/dL — ABNORMAL LOW (ref 3.5–5.0)
Anion gap: 17 — ABNORMAL HIGH (ref 5–15)
BUN: 103 mg/dL — ABNORMAL HIGH (ref 6–20)
CO2: 20 mmol/L — ABNORMAL LOW (ref 22–32)
Calcium: 9.3 mg/dL (ref 8.9–10.3)
Chloride: 94 mmol/L — ABNORMAL LOW (ref 98–111)
Creatinine, Ser: 3.38 mg/dL — ABNORMAL HIGH (ref 0.61–1.24)
GFR calc Af Amer: 23 mL/min — ABNORMAL LOW
GFR calc non Af Amer: 19 mL/min — ABNORMAL LOW
Glucose, Bld: 125 mg/dL — ABNORMAL HIGH (ref 70–99)
Phosphorus: 4.9 mg/dL — ABNORMAL HIGH (ref 2.5–4.6)
Potassium: 4.5 mmol/L (ref 3.5–5.1)
Sodium: 131 mmol/L — ABNORMAL LOW (ref 135–145)

## 2019-06-19 LAB — CBC
HCT: 35.3 % — ABNORMAL LOW (ref 39.0–52.0)
Hemoglobin: 11.6 g/dL — ABNORMAL LOW (ref 13.0–17.0)
MCH: 28.5 pg (ref 26.0–34.0)
MCHC: 32.9 g/dL (ref 30.0–36.0)
MCV: 86.7 fL (ref 80.0–100.0)
Platelets: 227 10*3/uL (ref 150–400)
RBC: 4.07 MIL/uL — ABNORMAL LOW (ref 4.22–5.81)
RDW: 16.8 % — ABNORMAL HIGH (ref 11.5–15.5)
WBC: 10 10*3/uL (ref 4.0–10.5)
nRBC: 1.6 % — ABNORMAL HIGH (ref 0.0–0.2)

## 2019-06-19 LAB — QUANTIFERON-TB GOLD PLUS (RQFGPL)
QuantiFERON Mitogen Value: 0.09 IU/mL
QuantiFERON Nil Value: 0.01 IU/mL
QuantiFERON TB1 Ag Value: 0.01 IU/mL
QuantiFERON TB2 Ag Value: 0.01 IU/mL

## 2019-06-19 LAB — QUANTIFERON-TB GOLD PLUS: QuantiFERON-TB Gold Plus: UNDETERMINED — AB

## 2019-06-19 MED ORDER — FUROSEMIDE 10 MG/ML IJ SOLN
80.0000 mg | Freq: Once | INTRAMUSCULAR | Status: AC
  Administered 2019-06-19: 80 mg via INTRAVENOUS

## 2019-06-19 MED ORDER — FUROSEMIDE 10 MG/ML IJ SOLN
INTRAMUSCULAR | Status: AC
  Filled 2019-06-19: qty 8

## 2019-06-19 MED ORDER — SODIUM CHLORIDE 0.9 % IV SOLN
2.0000 g | INTRAVENOUS | Status: DC
  Filled 2019-06-19: qty 2

## 2019-06-19 MED ORDER — MENTHOL 3 MG MT LOZG
1.0000 | LOZENGE | OROMUCOSAL | Status: DC | PRN
  Filled 2019-06-19: qty 9

## 2019-06-19 NOTE — Progress Notes (Signed)
Pharmacy Antibiotic Note  Stephen [name] is a 53 y.o. male admitted on 06/12/2019 with pneumonia.  Pharmacy has been consulted for Cefepime dosing. Patient's renal function starting to trend down. Vancomycin d/c'ed. MRSA PCR 6/17 negative.   Plan: Cefepime 1g IV q12h  Height: 6' 1.5" (186.7 cm) Weight: 220 lb 14.4 oz (100.2 kg) IBW/kg (Calculated) : 81.05  Temp (24hrs), Avg:97.7 F (36.5 C), Min:97.6 F (36.4 C), Max:97.8 F (36.6 C)  Recent Labs  Lab 06/12/19 1818 06/13/19 0050  06/15/19 0504 06/16/19 0343 06/17/19 0249 06/18/19 0415 06/19/19 0801  WBC 8.1 7.5  --   --  11.6*  --  11.2* 10.0  CREATININE 1.91* 1.99*   < > 2.28* 3.29* 4.17*  4.06* 3.99* 3.38*   < > = values in this interval not displayed.    Estimated Creatinine Clearance: 31.3 mL/min (A) (by C-G formula based on SCr of 3.38 mg/dL (H)).    Allergies  Allergen Reactions  . Lisinopril Cough    Antimicrobials this admission: Vancomycin 6/17 >> 6/18 Cefepime 6/17 >>   Thank you for allowing pharmacy to be a part of this patient's care.  Eleonore Chiquito, PharmD, BCPS 06/19/2019 9:47 AM

## 2019-06-19 NOTE — Progress Notes (Signed)
[facility] [place] NAME: Stephen [name]    MR#:  [id number]  DATE OF BIRTH:  05/18/1966  SUBJECTIVE:  CHIEF COMPLAINT: Patient continues to be short of breath    REVIEW OF SYSTEMS:  CONSTITUTIONAL: No fever, fatigue or weakness.  EYES: No blurred or double vision.  EARS, NOSE, AND THROAT: No tinnitus or ear pain.  RESPIRATORY: No cough, improving shortness of breath, denies wheezing or hemoptysis.  CARDIOVASCULAR: No chest pain, orthopnea, edema.  GASTROINTESTINAL: Reporting nausea, vomiting, denies diarrhea or abdominal pain.  GENITOURINARY: No dysuria, hematuria.  ENDOCRINE: No polyuria, nocturia,  HEMATOLOGY: No anemia, easy bruising or bleeding SKIN: No rash or lesion. MUSCULOSKELETAL: No joint pain or arthritis.   NEUROLOGIC: No tingling, numbness, weakness.  PSYCHIATRY: No anxiety or depression.   DRUG ALLERGIES:   Allergies  Allergen Reactions  . Lisinopril Cough    VITALS:  Blood pressure 108/67, pulse 73, temperature 97.7 F (36.5 C), temperature source Oral, resp. rate 20, height 6' 1.5" (1.867 m), weight 100.2 kg, SpO2 94 %.  PHYSICAL EXAMINATION:  GENERAL:  53 y.o.-year-old patient lying in the bed with no acute distress.  EYES: Pupils equal, round, reactive to light and accommodation. No scleral icterus. Extraocular muscles intact.  HEENT: Head atraumatic, normocephalic. Oropharynx and nasopharynx clear.  NECK:  Supple, no jugular venous distention. No thyroid enlargement, no tenderness.  LUNGS: Normal breath sounds bilaterally, no wheezing, rales,rhonchi or crepitation. No use of accessory muscles of respiration.  CARDIOVASCULAR: S1, S2 normal. No murmurs, rubs, or gallops.  ABDOMEN: Soft, nontender, nondistended. Bowel sounds present. EXTREMITIES: No pedal edema, cyanosis, or clubbing.  NEUROLOGIC: Cranial nerves II through XII are intact. Muscle strength at his baseline in all extremities. Sensation  intact. Gait not checked.  PSYCHIATRIC: The patient is alert and oriented x 3.  SKIN: No obvious rash, lesion, or ulcer.    LABORATORY PANEL:   CBC Recent Labs  Lab 06/19/19 0801  WBC 10.0  HGB 11.6*  HCT 35.3*  PLT 227   ------------------------------------------------------------------------------------------------------------------  Chemistries  Recent Labs  Lab 06/12/19 1818  06/14/19 0447  06/19/19 0801  NA 134*   < > 140   < > 131*  K 4.8   < > 3.6   < > 4.5  CL 103   < > 105   < > 94*  CO2 19*   < > 22   < > 20*  GLUCOSE 172*   < > 112*   < > 125*  BUN 27*   < > 32*   < > 103*  CREATININE 1.91*   < > 1.80*   < > 3.38*  CALCIUM 9.4   < > 9.5   < > 9.3  MG  --    < > 2.3  --   --   AST 14*  --   --   --   --   ALT 14  --   --   --   --   ALKPHOS 104  --   --   --   --   BILITOT 0.7  --   --   --   --    < > = values in this interval not displayed.   ------------------------------------------------------------------------------------------------------------------  Cardiac Enzymes Recent Labs  Lab 06/13/19 0641  TROPONINI 0.12*   ------------------------------------------------------------------------------------------------------------------  RADIOLOGY:  No results found.  EKG:   Orders placed or performed during the hospital encounter of 06/12/19  .  EKG 12-Lead  . EKG 12-Lead  . ED EKG  . ED EKG  . EKG 12-Lead  . EKG 12-Lead  . EKG 12-Lead  . EKG 12-Lead    ASSESSMENT AND PLAN:   #AK I -chronic kidney disease stage III-secondary to overdiuresis and GI losses-cardiorenal syndrome Patient seen by nephrology encouraged to have hemodialysis patient is currently reluctant Nephrologist continue to follow the patient Seen by vascular surgery for possible vascular acccess   #Intractable nausea and vomiting-could be from GERD PPI twice daily, Carafate 1 g 4 times a day Negative H. pylori stool test Patient had a upper GI series which was  abnormal however GI feels that he is too high risk for any procedure   #Hyperkalemia Potassium  4.5  # Acute on chronic systolic congestive heart failure with history of cardiomyopathy- nonischemic, ejection fraction around 10 percent with AICD Continue IV Lasix Seen by cardiology [name].  Appreciate his recommendations.  [name] has discussed with the family members Daily weights, intake and output Continue home medication aspirin 81 mg, Plavix, Coreg, statin Continue amiodarone 200 mg 2 times a day Goal is to keep his potassium at 4 and magnesium at 2.0   #COPD chronic no exacerbation Continue bronchodilators  #Essential hypertension Currently patient is hypotensive hold blood pressure medicines   #Hyperlipidemia continue home dose statin  DVT prophylaxis with Lovenox subcu renal dose adjusted  All the records are reviewed and case discussed with Care Management/Social Workerr. Management plans discussed with the patient, spoke to patient's sister over phone.  They both verbalized understanding of the plan  CODE STATUS: fc   TOTAL TIME TAKING CARE OF THIS PATIENT: 36  minutes.   POSSIBLE D/C ??  Note: This dictation was prepared with Dragon dictation along with smaller phrase technology. Any transcriptional errors that result from this process are unintentional.   [name] M.D on 06/19/2019 at 12:37 PM  Between 7am to 6pm - Pager - [phone] After 6pm go to [email or web address] - password [email or web address] [facility]  Office  [phone]  CC: Primary care physician; Valerie Roys, DO

## 2019-06-19 NOTE — Consult Note (Signed)
PHARMACY CONSULT NOTE - FOLLOW UP  Pharmacy Consult for Electrolyte Monitoring and Replacement   Recent Labs: Potassium (mmol/L)  Date Value  06/19/2019 4.5   Magnesium (mg/dL)  Date Value  06/14/2019 2.3   Calcium (mg/dL)  Date Value  06/19/2019 9.3   Albumin (g/dL)  Date Value  06/19/2019 2.8 (L)  03/17/2019 4.3   Phosphorus (mg/dL)  Date Value  06/19/2019 4.9 (H)   Sodium (mmol/L)  Date Value  06/19/2019 131 (L)  05/15/2019 137     Assessment: Patient presents to the ED with a complaint of shortness of breath and chest discomfort.  He also states he has had some abdominal swelling and recent increase in weight of about 8 pounds.  He has a known history of heart failure and has been in our facility somewhat frequently lately. Started on furosemide 20 mg IV q12H, now holding lasix. On spironolactone 12.5 mg daily- d/c'ed due to elevated potassium. Phosphorus elevated as well. May need nephro consult or start phos blinder. Pt is refusing HD and received one dose lasix 60 mg x 1.   Goal of Therapy:  K+ 4-5, Mg 2- 2.4   Plan:  K+ trending down. At 4.3 today, Lokelma dose increased to 10g BID on 6/17. Will continue to follow.   Eleonore Chiquito , PharmD, BCPS 06/19/2019 9:41 AM

## 2019-06-19 NOTE — Progress Notes (Signed)
Ch visited pt as a f/u. Pt is a 53 y.o. male that is frequently hospitalized mainly related to chronic CHF. Pt shared that he was having trouble with fluid retention around his heart. Ch provided space for pt to lament about his concerns related to a missed [facility] appointment that was supposed to happen on 6.20.20 as reported by the pt. Pt was very disappointed that he was not able to move forward with the test especially since this would be what determines if he would be a good candidate for a heart transplant. Ch has had several encounters w/ the pt where he has stated: "I'm in between a rock and a hard place." The pt realizes that his kidneys are declining due to the CHF yet he is hopeful to transfer to [facility] for care even though the pt reported that his providers are apprehensive about this transition. Pt has support from his sister and daughter that is visiting from [place] until Sunday. Pt was grateful that she was able to she him while hospitalized to celebrate his birthday and Father's Day. Ch expressed concerned about the pt's Per[place] regarding the best starting point for him since he is having a hard time w/ the CHF and is does not want to be on dialysis. Ch shared that she would f/u later.    06/19/19 1000  Clinical Encounter Type  Visited With Patient  Visit Type Follow-up;Spiritual support;Psychological support;Social support  Recommendations pt should be consulted on completing an AD   Spiritual Encounters  Spiritual Needs Emotional;Grief support  Stress Factors  Patient Stress Factors Family relationships;Exhausted;Health changes;Loss of control;Major life changes  Family Stress Factors None identified

## 2019-06-19 NOTE — Progress Notes (Signed)
Patient placed on high flow nasal cannula at 11L, SAT at 93%. Patient does have bilateral diminished/fine crackles. RN aware. Will continue to monitor.

## 2019-06-19 NOTE — Progress Notes (Signed)
[facility]  ROUNDING NOTE   Subjective:   UOP 582  Creatinine 3.38 (3.99) (4.17) K 4.5 (5.3) (6.2)  Objective:  Vital signs in last 24 hours:  Temp:  [97.6 F (36.4 C)-97.8 F (36.6 C)] 97.7 F (36.5 C) (06/19 0822) Pulse Rate:  [72-80] 73 (06/19 1136) Resp:  [19-22] 20 (06/19 0822) BP: (90-108)/(67-85) 108/67 (06/19 1136) SpO2:  [92 %-97 %] 94 % (06/19 0822) Weight:  [100.2 kg] 100.2 kg (06/19 0655)  Weight change: 0.399 kg Filed Weights   06/17/19 0525 06/18/19 0439 06/19/19 0655  Weight: 99.7 kg 99.8 kg 100.2 kg    Intake/Output: I/O last 3 completed shifts: In: 1851 [I.V.:38.2; IV Piggyback:1812.8] Out: 902 [Urine:902]   Intake/Output this shift:  Total I/O In: -  Out: 620 [Urine:620]  Physical Exam: General: NAD,   Head: Normocephalic, atraumatic. Moist oral mucosal membranes  Eyes: Anicteric, PERRL  Neck: Supple, trachea midline  Lungs:  Crackles at bases Right > Left  Heart: Regular rate and rhythm  Abdomen:  Soft, nontender,   Extremities: no peripheral edema.  Neurologic: Nonfocal, moving all four extremities  Skin: No lesions       Basic Metabolic Panel: Recent Labs  Lab 06/13/19 0641 06/14/19 0447  06/15/19 0504 06/16/19 0343 06/17/19 0249 06/18/19 0415 06/19/19 0801  NA  --  140  --  140 137 133*  133* 131* 131*  K  --  3.6   < > 5.2* 5.6* 6.2*  6.2* 5.3* 4.5  CL  --  105  --  107 102 97*  97* 97* 94*  CO2  --  22  --  21* 22 21*  21* 19* 20*  GLUCOSE  --  112*  --  142* 156* 144*  144* 152* 125*  BUN  --  32*  --  36* 51* 74*  72* 93* 103*  CREATININE  --  1.80*  --  2.28* 3.29* 4.17*  4.06* 3.99* 3.38*  CALCIUM  --  9.5  --  9.7 9.7 9.7  9.7 9.4 9.3  MG 2.2 2.3  --   --   --   --   --   --   PHOS  --   --   --   --   --  4.8*  --  4.9*   < > = values in this interval not displayed.    Liver Function Tests: Recent Labs  Lab 06/12/19 1818 06/17/19 0249 06/19/19 0801  AST 14*  --   --   ALT 14  --   --    ALKPHOS 104  --   --   BILITOT 0.7  --   --   PROT 7.5  --   --   ALBUMIN 3.5 3.3* 2.8*   Recent Labs  Lab 06/12/19 1818  LIPASE 20   No results for input(s): AMMONIA in the last 168 hours.  CBC: Recent Labs  Lab 06/12/19 1818 06/13/19 0050 06/15/19 1901 06/16/19 0343 06/17/19 2107 06/18/19 0415 06/19/19 0801  WBC 8.1 7.5  --  11.6*  --  11.2* 10.0  HGB 14.3 13.4 13.5 13.2 12.0* 12.1* 11.6*  HCT 44.2 41.4 42.1 41.6 36.6* 36.9* 35.3*  MCV 88.0 87.5  --  90.4  --  86.4 86.7  PLT 384 337  --  295  --  250 227    Cardiac Enzymes: Recent Labs  Lab 06/12/19 1818 06/13/19 0050 06/13/19 0641  TROPONINI 0.07* 0.10* 0.12*    BNP: Invalid  input(s): POCBNP  CBG: Recent Labs  Lab 06/15/19 0237  GLUCAP 180*    Microbiology: Results for orders placed or performed during the hospital encounter of 06/12/19  SARS Coronavirus 2 (CEPHEID- Performed in [facility] hospital lab), Hosp Order     Status: None   Collection Time: 06/12/19  9:52 PM   Specimen: Nasopharyngeal Swab  Result Value Ref Range Status   SARS Coronavirus 2 NEGATIVE NEGATIVE Final    Comment: (NOTE) If result is NEGATIVE SARS-CoV-2 target nucleic acids are NOT DETECTED. The SARS-CoV-2 RNA is generally detectable in upper and lower  respiratory specimens during the acute phase of infection. The lowest  concentration of SARS-CoV-2 viral copies this assay can detect is 250  copies / mL. A negative result does not preclude SARS-CoV-2 infection  and should not be used as the sole basis for treatment or other  patient management decisions.  A negative result may occur with  improper specimen collection / handling, submission of specimen other  than nasopharyngeal swab, presence of viral mutation(s) within the  areas targeted by this assay, and inadequate number of viral copies  (<250 copies / mL). A negative result must be combined with clinical  observations, patient history, and epidemiological  information. If result is POSITIVE SARS-CoV-2 target nucleic acids are DETECTED. The SARS-CoV-2 RNA is generally detectable in upper and lower  respiratory specimens dur ing the acute phase of infection.  Positive  results are indicative of active infection with SARS-CoV-2.  Clinical  correlation with patient history and other diagnostic information is  necessary to determine patient infection status.  Positive results do  not rule out bacterial infection or co-infection with other viruses. If result is PRESUMPTIVE POSTIVE SARS-CoV-2 nucleic acids MAY BE PRESENT.   A presumptive positive result was obtained on the submitted specimen  and confirmed on repeat testing.  While 2019 novel coronavirus  (SARS-CoV-2) nucleic acids may be present in the submitted sample  additional confirmatory testing may be necessary for epidemiological  and / or clinical management purposes  to differentiate between  SARS-CoV-2 and other Sarbecovirus currently known to infect humans.  If clinically indicated additional testing with an alternate test  methodology [phone]) is advised. The SARS-CoV-2 RNA is generally  detectable in upper and lower respiratory sp ecimens during the acute  phase of infection. The expected result is Negative. Fact Sheet for Patients:  StrictlyIdeas.no Fact Sheet for Healthcare Providers: BankingDealers.co.za This test is not yet approved or cleared by the [place] FDA and has been authorized for detection and/or diagnosis of SARS-CoV-2 by FDA under an Emergency Use Authorization (EUA).  This EUA will remain in effect (meaning this test can be used) for the duration of the COVID-19 declaration under Section 564(b)(1) of the Act, 21 U.S.C. section 360bbb-3(b)(1), unless the authorization is terminated or revoked sooner. Performed at [facility], [address] [place], [place] [address]   Expectorated sputum  assessment w rflx to resp cult     Status: None   Collection Time: 06/15/19 10:49 PM   Specimen: Expectorated Sputum  Result Value Ref Range Status   Specimen Description EXPECTORATED SPUTUM  Final   Special Requests Normal  Final   Sputum evaluation   Final    THIS SPECIMEN IS ACCEPTABLE FOR SPUTUM CULTURE Performed at [facility], [address], [place], [place] [address]    Report Status 06/16/2019 FINAL  Final  Culture, respiratory     Status: None   Collection Time: 06/15/19  10:49 PM  Result Value Ref Range Status   Specimen Description   Final    EXPECTORATED SPUTUM Performed at [facility], [address] [place], [place] [address]    Special Requests   Final    Normal Reflexed from [id number] Performed at [facility], [place], [place] [address]    Gram Stain   Final    MODERATE WBC PRESENT, PREDOMINANTLY PMN ABUNDANT GRAM POSITIVE COCCI ABUNDANT GRAM POSITIVE RODS    Culture   Final    ABUNDANT Consistent with normal respiratory flora. Performed at [facility], [address], [place], [place] [address]    Report Status 06/18/2019 FINAL  Final  MRSA PCR Screening     Status: None   Collection Time: 06/17/19 11:20 AM   Specimen: Nasopharyngeal  Result Value Ref Range Status   MRSA by PCR NEGATIVE NEGATIVE Final    Comment:        The GeneXpert MRSA Assay (FDA approved for NASAL specimens only), is one component of a comprehensive MRSA colonization surveillance program. It is not intended to diagnose MRSA infection nor to guide or monitor treatment for MRSA infections. Performed at [facility], [address] [place], [place] [address]     Coagulation Studies: No results for input(s): LABPROT, INR in the last 72 hours.  Urinalysis: No results for input(s): COLORURINE, LABSPEC, PHURINE, GLUCOSEU, HGBUR, BILIRUBINUR, KETONESUR, PROTEINUR, UROBILINOGEN, NITRITE, LEUKOCYTESUR in the last 72  hours.  Invalid input(s): APPERANCEUR    Imaging: No results found.   Medications:   . sodium chloride 250 mL (06/18/19 2115)  . ceFEPime (MAXIPIME) IV 1 g (06/19/19 1114)   . amiodarone  200 mg Oral BID  . aspirin EC  81 mg Oral Daily  . atorvastatin  40 mg Oral q1800  . clopidogrel  75 mg Oral Daily  . diclofenac sodium  2 g Topical QID  . feeding supplement (ENSURE ENLIVE)  237 mL Oral BID BM  . heparin injection (subcutaneous)  5,000 Units Subcutaneous Q8H  . ipratropium-albuterol  3 mL Nebulization Q6H  . linaclotide  145 mcg Oral QAC breakfast  . methimazole  40 mg Oral Daily  . mometasone-formoterol  2 puff Inhalation BID  . multivitamin  1 tablet Oral QHS  . multivitamin with minerals  1 tablet Oral Daily  . pantoprazole (PROTONIX) IV  40 mg Intravenous Q12H  . polyethylene glycol  17 g Oral Daily  . senna-docusate  1 tablet Oral Daily  . sodium chloride flush  10-40 mL Intracatheter Q12H  . sodium chloride flush  3 mL Intravenous Q12H  . sodium zirconium cyclosilicate  10 g Oral BID  . tiotropium  1 capsule Inhalation Daily   sodium chloride, acetaminophen **OR** acetaminophen, alum & mag hydroxide-simeth, guaiFENesin-dextromethorphan, ondansetron **OR** ondansetron (ZOFRAN) IV, promethazine, sodium chloride flush  Assessment/ Plan:  [name] is a 53 y.o. black male withchronic systolic heart failure ejection fraction 10-15%, prior history of cardiac arrest, hyperlipidemia, nephrolithiasis, hypertension, history of TIA, was admitted to [facility] on6/12/2019  for acute exacerbation of systolic congestive heart failure, EF 10%  1. Acute renal failure with hyperkalemia and metabolic acidosis onChronic kidney disease stage III baseline creatinine 1.85, GFR of 47 on 01/24/19. Bland urine.  Concerning for pulmonary edema and worsening renal function and hyperkalemia.  Hyperkalemia could partially be due to spironolactone.  - hold furosemide  - Lokelma   2.  Hypotension with systolic congestive heart failure:   -  Discontinued carvedilol - Appreciate cardiology input.     LOS: 7 Kandas Oliveto 6/19/202012:07 PM

## 2019-06-19 NOTE — Progress Notes (Addendum)
Patient is having difficulty breathing this evening. He is requiring 11L hi-flo [place], tachypneic, and crackles can be heard throughout lungs. MD Kollaru notified. Order given for IV lasix 80 mg once. This RN to place order and administer as ordered.   Update: IV lasix given. Only 50 mL of urine output. Crackles still present throughout lungs. Patient still slightly tachypneic and reports still feeling "bad". However, oxygen saturations slightly improved, 96% on 11L HiFlo.  MD Kollaru made aware. Per MD, patient needs HD at this point and they will see patient in morning. Also advised that hospitalist assess patient to make sure patient does not need to be in ICU. Hospitalist paged, awaiting response.   Update: MD paged again, awaiting response.   Update: MD Mansy made aware. Per MD, patient is currently stable, "not in distress" therefore he will hold off on bedside exam at this time. MD ordered ABG. This RN will place order and continue to monitor.   Stephen [name] M

## 2019-06-20 LAB — BASIC METABOLIC PANEL
Anion gap: 16 — ABNORMAL HIGH (ref 5–15)
Anion gap: 16 — ABNORMAL HIGH (ref 5–15)
BUN: 111 mg/dL — ABNORMAL HIGH (ref 6–20)
BUN: 105 mg/dL — ABNORMAL HIGH (ref 6–20)
CO2: 21 mmol/L — ABNORMAL LOW (ref 22–32)
CO2: 22 mmol/L (ref 22–32)
Calcium: 9.3 mg/dL (ref 8.9–10.3)
Calcium: 9.1 mg/dL (ref 8.9–10.3)
Chloride: 93 mmol/L — ABNORMAL LOW (ref 98–111)
Chloride: 91 mmol/L — ABNORMAL LOW (ref 98–111)
Creatinine, Ser: 3.3 mg/dL — ABNORMAL HIGH (ref 0.61–1.24)
Creatinine, Ser: 3.1 mg/dL — ABNORMAL HIGH (ref 0.61–1.24)
GFR calc Af Amer: 23 mL/min — ABNORMAL LOW (ref >60)
GFR calc Af Amer: 25 mL/min — ABNORMAL LOW (ref >60)
GFR calc non Af Amer: 20 mL/min — ABNORMAL LOW (ref >60)
GFR calc non Af Amer: 22 mL/min — ABNORMAL LOW (ref >60)
Glucose, Bld: 153 mg/dL — ABNORMAL HIGH (ref 70–99)
Glucose, Bld: 127 mg/dL — ABNORMAL HIGH (ref 70–99)
Potassium: 4.7 mmol/L (ref 3.5–5.1)
Potassium: 4.3 mmol/L (ref 3.5–5.1)
Sodium: 130 mmol/L — ABNORMAL LOW (ref 135–145)
Sodium: 129 mmol/L — ABNORMAL LOW (ref 135–145)

## 2019-06-20 LAB — BLOOD GAS, ARTERIAL
Acid-base deficit: 4 mmol/L — ABNORMAL HIGH (ref 0.0–2.0)
Bicarbonate: 19.8 mmol/L — ABNORMAL LOW (ref 20.0–28.0)
FIO2: 0.44
O2 Saturation: 96.6 %
Patient temperature: 37
pCO2 arterial: 32 mmHg (ref 32.0–48.0)
pH, Arterial: 7.4 (ref 7.350–7.450)
pO2, Arterial: 87 mmHg (ref 83.0–108.0)

## 2019-06-20 LAB — PROCALCITONIN: Procalcitonin: 5.54 ng/mL

## 2019-06-20 MED ORDER — SODIUM CHLORIDE 0.9 % IV SOLN
2.0000 g | Freq: Every day | INTRAVENOUS | Status: DC
  Administered 2019-06-20: 2 g via INTRAVENOUS
  Filled 2019-06-20 (×2): qty 2

## 2019-06-20 MED ORDER — CEFDINIR 300 MG PO CAPS
300.0000 mg | ORAL_CAPSULE | ORAL | Status: DC
  Filled 2019-06-20: qty 1

## 2019-06-20 MED ORDER — SODIUM CHLORIDE 0.9 % IV SOLN
2.0000 g | Freq: Every day | INTRAVENOUS | Status: DC
  Filled 2019-06-20: qty 2

## 2019-06-20 MED ORDER — CEFDINIR 300 MG PO CAPS
300.0000 mg | ORAL_CAPSULE | Freq: Once | ORAL | Status: DC
  Filled 2019-06-20: qty 1

## 2019-06-20 MED ORDER — LIDOCAINE HCL 1 % IJ SOLN
20.0000 mL | Freq: Once | INTRAMUSCULAR | Status: AC
  Administered 2019-06-20: 20 mL via INTRADERMAL
  Filled 2019-06-20 (×2): qty 20

## 2019-06-20 MED ORDER — CHLORHEXIDINE GLUCONATE CLOTH 2 % EX PADS
6.0000 | MEDICATED_PAD | Freq: Every day | CUTANEOUS | Status: DC
  Administered 2019-06-20 – 2019-06-22 (×3): 6 via TOPICAL

## 2019-06-20 NOTE — Progress Notes (Signed)
Hd start   06/20/19 1045  Vital Signs  Temp 97.9 F (36.6 C)  Temp Source Oral  Pulse Rate 65  Pulse Rate Source Monitor  Resp 20  BP 110/84  BP Location Left Arm  BP Method Automatic  Patient Position (if appropriate) Lying  Oxygen Therapy  SpO2 100 %  O2 Device Nasal Cannula  O2 Flow Rate (L/min) 11 L/min  During Hemodialysis Assessment  Blood Flow Rate (mL/min) 200 mL/min  Arterial Pressure (mmHg) -150 mmHg  Venous Pressure (mmHg) 210 mmHg  Transmembrane Pressure (mmHg) 30 mmHg  Ultrafiltration Rate (mL/min) 500 mL/min  Dialysate Flow Rate (mL/min) 300 ml/min  Conductivity: Machine  14  HD Safety Checks Performed Yes  Dialysis Fluid Bolus Normal Saline  Bolus Amount (mL) 250 mL  Intra-Hemodialysis Comments Tx initiated ([facility]vc care per policy)

## 2019-06-20 NOTE — Progress Notes (Signed)
Patient declined scheduled 0200 breathing treatment despite current condition. Patient is still tachypneic, SATs have improved slightly from 93% to 96% on 11L high flow nasal canula, bilateral crackles noted. RN aware. Will continue to monitor.

## 2019-06-20 NOTE — Consult Note (Signed)
PHARMACY CONSULT NOTE - FOLLOW UP  Pharmacy Consult for Electrolyte Monitoring and Replacement   Recent Labs: Potassium (mmol/L)  Date Value  06/20/2019 4.7   Magnesium (mg/dL)  Date Value  06/14/2019 2.3   Calcium (mg/dL)  Date Value  06/20/2019 9.3   Albumin (g/dL)  Date Value  06/19/2019 2.8 (L)  03/17/2019 4.3   Phosphorus (mg/dL)  Date Value  06/19/2019 4.9 (H)   Sodium (mmol/L)  Date Value  06/20/2019 130 (L)  05/15/2019 137     Assessment: Patient presents to the ED with a complaint of shortness of breath and chest discomfort.  He also states he has had some abdominal swelling and recent increase in weight of about 8 pounds.  He has a known history of heart failure and has been in our facility somewhat frequently lately. Started on furosemide 20 mg IV q12H, now holding lasix. On spironolactone 12.5 mg daily- d/c'ed due to elevated potassium. Phosphorus elevated as well. May need nephro consult or start phos blinder. Pt is refusing HD and received one dose lasix 60 mg x 1.   Goal of Therapy:  K+ 4-5, Mg 2- 2.4   Plan:  Patient to have dialysis today. Will ensure labs are ordered for am.   Pharmacy will complete consult and defer electrolyte replacement to nephrology.   Currie Paris, RPh  06/20/2019 10:39 AM

## 2019-06-20 NOTE — Progress Notes (Signed)
PT Cancellation Note  Patient Details Name: Stephen [name] MRN: [id number] DOB: 1966/03/31   Cancelled Treatment:    Reason Eval/Treat Not Completed: Other (comment)(Per chart review, pt off the floor currently for hemodialysis, PT will initiate evaulation as able.)   Lieutenant Diego PT, DPT 11:07 AM,06/20/19 [phone]

## 2019-06-20 NOTE — Progress Notes (Signed)
Patient with ARF; initially refused HD and catheter placement a few days ago. Now requiring emergent HD secondary to worsening clinical status. Tachypnea,pulmonary edema and hyperkalemia.   Discussed with Dr. Juleen China and patient. Sister notified per Dr. Juleen China. Patient now agreeable to only temporary HD access.  Risks, benefits and alternatives explained. All questions answered. Consent obtained.

## 2019-06-20 NOTE — Plan of Care (Signed)
  Problem: Clinical Measurements: Goal: Respiratory complications will improve Outcome: Not Progressing Note: Patient requiring 11L HiFLo [place]   Problem: Education: Goal: Knowledge of General Education information will improve Description: Including pain rating scale, medication(s)/side effects and non-pharmacologic comfort measures Outcome: Progressing   Problem: Safety: Goal: Ability to remain free from injury will improve Outcome: Progressing

## 2019-06-20 NOTE — Progress Notes (Signed)
[place] [facility] [place] NAME: Stephen [name]    MR#:  [id number]  DATE OF BIRTH:  [birth year]  SUBJECTIVE:  CHIEF COMPLAINT: Patient continues to be short of breath requiring 11 Liter [place] patient not able to complete a sentence without getting short of breath. He has a wet cough. No fever.  Overall a very poor historian REVIEW OF SYSTEMS:  CONSTITUTIONAL: No fever, positive fatigue and weakness.  EYES: No blurred or double vision.  EARS, NOSE, AND THROAT: No tinnitus or ear pain.  RESPIRATORY: + cough, improving shortness of breath, denies wheezing or hemoptysis.  CARDIOVASCULAR: No chest pain, orthopnea, edema.  GASTROINTESTINAL: Reporting nausea, novomiting, denies diarrhea or abdominal pain.  GENITOURINARY: No dysuria, hematuria.  ENDOCRINE: No polyuria, nocturia,  HEMATOLOGY: No anemia, easy bruising or bleeding SKIN: No rash or lesion. MUSCULOSKELETAL: No joint pain or arthritis.   NEUROLOGIC: No tingling, numbness, weakness.  PSYCHIATRY: No anxiety or depression.   DRUG ALLERGIES:   Allergies  Allergen Reactions  . Lisinopril Cough    VITALS:  Blood pressure 91/68, pulse 67, temperature 98.1 F (36.7 C), resp. rate 20, height 6' 1.5" (1.867 m), weight 101.2 kg, SpO2 98 %.  PHYSICAL EXAMINATION:  GENERAL:  53 y.o.-year-old patient lying in the bed with mild acute distress. Chronically ill EYES: Pupils equal, round, reactive to light and accommodation. No scleral icterus. Extraocular muscles intact.  HEENT: Head atraumatic, normocephalic. Oropharynx and nasopharynx dry NECK:  Supple, no jugular venous distention. No thyroid enlargement, no tenderness.  LUNGS: decreased breath sounds bilaterally, no wheezing, positive rales,rhonchi . No use of accessory muscles of respiration.  CARDIOVASCULAR: S1, S2 normal. No murmurs, rubs, or gallops.  ABDOMEN: Soft, nontender, nondistended. Bowel sounds present. EXTREMITIES: No pedal  edema, cyanosis, or clubbing.  NEUROLOGIC: Cranial nerves II through XII are intact. Muscle strength at his baseline in all extremities. Sensation intact. Gait not checked. Generalized weakness PSYCHIATRIC:  patient is alert and oriented x 2.  SKIN: No obvious rash, lesion, or ulcer.    LABORATORY PANEL:   CBC Recent Labs  Lab 06/19/19 0801  WBC 10.0  HGB 11.6*  HCT 35.3*  PLT 227   ------------------------------------------------------------------------------------------------------------------  Chemistries  Recent Labs  Lab 06/14/19 0447  06/20/19 0600  NA 140   < > 130*  K 3.6   < > 4.7  CL 105   < > 93*  CO2 22   < > 21*  GLUCOSE 112*   < > 153*  BUN 32*   < > 111*  CREATININE 1.80*   < > 3.30*  CALCIUM 9.5   < > 9.3  MG 2.3  --   --    < > = values in this interval not displayed.   ------------------------------------------------------------------------------------------------------------------  Cardiac Enzymes No results for input(s): TROPONINI in the last 168 hours. ------------------------------------------------------------------------------------------------------------------  RADIOLOGY:  No results found.  EKG:   Orders placed or performed during the hospital encounter of 06/12/19  . EKG 12-Lead  . EKG 12-Lead  . ED EKG  . ED EKG  . EKG 12-Lead  . EKG 12-Lead  . EKG 12-Lead  . EKG 12-Lead    ASSESSMENT AND PLAN:  Stephen [name]  is a 53 y.o. male who presents  to the ED with a complaint of shortness of breath and chest discomfort.  He also states he has had some abdominal swelling and recent increase in weight of about 8 pounds  #Acute on chronic kidney disease  stage III concerning for pulmonary edema and worsening renal function but hyperkalemia -Patient seen by nephrology encouraged to have hemodialysis. Patient now agreeable for hemodialysis.  -Dr. Juleen China spoke with patient's sister on the phone. She is agreement with plan. -Seen by  vascular surgery Dr. Lorenso Courier for possible vascular access -patient currently is not a candidate for transfer to [facility]. He is hemodynamically not stable. They tell patient he understands. -Patient has been started on antibiotics empirically for lung infection. Patient does not have fever and white count normal. Will DC antibiotics once Pro calcitonin is negative.  #Intractable nausea and vomiting-could be from GERD -PPI twice daily, Carafate 1 g 4 times a day -Negative H. pylori stool test -Patient had a upper GI series which was abnormal however GI feels that he is too high risk for any procedure -continue renal diet  #Hyperkalemia Potassium  4.5  # Acute on chronic systolic congestive heart failure with history of cardiomyopathy- nonischemic, ejection fraction around 10 percent with AICD -Seen by cardiology [name].  Appreciate his recommendations.  [name] has discussed with the family members -Daily weights, intake and output -Continue home medication aspirin 81 mg, Plavix, statin -Continue amiodarone 200 mg 2 times a day -Goal is to keep his potassium at 4 and magnesium at 2.0 -patient not a candidate for transfer to [facility] at present.  #COPD chronic no exacerbation Continue bronchodilators  #Essential hypertension Currently patient is hypotensive hold blood pressure medicines-- Coreg on hold  #Hyperlipidemia continue home dose statin  #DVT prophylaxis with HEPARIN  Dr. Juleen China spoke with sister on the phone and updated. CODE STATUS: fc   TOTAL TIME TAKING CARE OF THIS PATIENT: 36  minutes.   POSSIBLE D/C ??  Note: This dictation was prepared with Dragon dictation along with smaller phrase technology. Any transcriptional errors that result from this process are unintentional.   Fritzi Mandes M.D on 06/20/2019 at 8:54 AM  Between 7am to 6pm - Pager - [phone] After 6pm go to [email or web address] - password [email or web address] [facility]  Office   [phone]  CC: Primary care physician; Valerie Roys, DO

## 2019-06-20 NOTE — Procedures (Signed)
  OPERATIVE NOTE   PROCEDURE: 1. Ultrasound guidance for vascular access right femoral vein 2. Placement of [name] 30cm Temporary dialysis catheter right femoral vein  PRE-OPERATIVE DIAGNOSIS: 1. Acute renal failure 2. Pulmonary edema  POST-OPERATIVE DIAGNOSIS: Same  SURGEON: Jamesetta So, MD, PhD  ASSISTANT(S): None  ANESTHESIA: local  ESTIMATED BLOOD LOSS: Minimal   FINDING(S): 1. None  SPECIMEN(S): None  INDICATIONS:  Patient is [name] 53 y.o.male who presents with ARF, pulmonary edema.  Risks and benefits were discussed, and informed consent was obtained..  DESCRIPTION: After obtaining full informed written consent, the patient was laid flat in the bed. The right groin was sterilely prepped and draped in [name] sterile surgical field was created. The right femoral vein was visualized with ultrasound and found to be widely patent. It was then accessed under direct guidance without difficulty with [name] Seldinger needle and [name] permanent image was recorded. [name] J-wire was then placed. After skin nick and dilatation, [name] 30 cm temporary  dialysis catheter was placed over the wire and the wire was removed. The lumens withdrew dark red nonpulsatile blood and flushed easily with sterile saline. The catheter was secured to the skin with 3 nylon sutures. Sterile dressing was placed.  COMPLICATIONS: None  CONDITION: Stable  Stephen [name], Stephen [name] 06/20/2019 11:33 AM  This note was created with Dragon Medical transcription system. Any errors in dictation are purely unintentional.

## 2019-06-20 NOTE — Progress Notes (Signed)
[facility]  ROUNDING NOTE   Subjective:   Increased work of breathing. Requiring HFNC this morning, Tachypnic.   Furosemide 80mg  IV last night with little urine output.   Objective:  Vital signs in last 24 hours:  Temp:  [97.3 F (36.3 C)-98.1 F (36.7 C)] 98.1 F (36.7 C) (06/20 0736) Pulse Rate:  [67-87] 67 (06/20 0736) Resp:  [16-22] 20 (06/20 0736) BP: (91-109)/(67-93) 91/68 (06/20 0736) SpO2:  [86 %-99 %] 98 % (06/20 0736) Weight:  [101.2 kg] 101.2 kg (06/20 0346)  Weight change: 1.04 kg Filed Weights   06/18/19 0439 06/19/19 0655 06/20/19 0346  Weight: 99.8 kg 100.2 kg 101.2 kg    Intake/Output: I/O last 3 completed shifts: In: 117.4 [I.V.:17.4; IV Piggyback:100] Out: 672 [Urine:672]   Intake/Output this shift:  No intake/output data recorded.  Physical Exam: General: Tachypnic, in mild respiratory distress  Head: Normocephalic, atraumatic. Moist oral mucosal membranes  Eyes: Anicteric, PERRL  Neck: Supple, trachea midline  Lungs:  Crackles bilaterally Right > Left  Heart: Regular rate and rhythm  Abdomen:  Soft, nontender,   Extremities: no peripheral edema.  Neurologic: Nonfocal, moving all four extremities  Skin: No lesions       Basic Metabolic Panel: Recent Labs  Lab 06/14/19 0447  06/16/19 0343 06/17/19 0249 06/18/19 0415 06/19/19 0801 06/20/19 0600  NA 140   < > 137 133*  133* 131* 131* 130*  K 3.6   < > 5.6* 6.2*  6.2* 5.3* 4.5 4.7  CL 105   < > 102 97*  97* 97* 94* 93*  CO2 22   < > 22 21*  21* 19* 20* 21*  GLUCOSE 112*   < > 156* 144*  144* 152* 125* 153*  BUN 32*   < > 51* 74*  72* 93* 103* 111*  CREATININE 1.80*   < > 3.29* 4.17*  4.06* 3.99* 3.38* 3.30*  CALCIUM 9.5   < > 9.7 9.7  9.7 9.4 9.3 9.3  MG 2.3  --   --   --   --   --   --   PHOS  --   --   --  4.8*  --  4.9*  --    < > = values in this interval not displayed.    Liver Function Tests: Recent Labs  Lab 06/17/19 0249 06/19/19 0801  ALBUMIN  3.3* 2.8*   No results for input(s): LIPASE, AMYLASE in the last 168 hours. No results for input(s): AMMONIA in the last 168 hours.  CBC: Recent Labs  Lab 06/15/19 1901 06/16/19 0343 06/17/19 2107 06/18/19 0415 06/19/19 0801  WBC  --  11.6*  --  11.2* 10.0  HGB 13.5 13.2 12.0* 12.1* 11.6*  HCT 42.1 41.6 36.6* 36.9* 35.3*  MCV  --  90.4  --  86.4 86.7  PLT  --  295  --  250 227    Cardiac Enzymes: No results for input(s): CKTOTAL, CKMB, CKMBINDEX, TROPONINI in the last 168 hours.  BNP: Invalid input(s): POCBNP  CBG: Recent Labs  Lab 06/15/19 0237  GLUCAP 180*    Microbiology: Results for orders placed or performed during the hospital encounter of 06/12/19  SARS Coronavirus 2 (CEPHEID- Performed in [place] hospital lab), Hosp Order     Status: None   Collection Time: 06/12/19  9:52 PM   Specimen: Nasopharyngeal Swab  Result Value Ref Range Status   SARS Coronavirus 2 NEGATIVE NEGATIVE Final    Comment: (NOTE) If  result is NEGATIVE SARS-CoV-2 target nucleic acids are NOT DETECTED. The SARS-CoV-2 RNA is generally detectable in upper and lower  respiratory specimens during the acute phase of infection. The lowest  concentration of SARS-CoV-2 viral copies this assay can detect is 250  copies / mL. A negative result does not preclude SARS-CoV-2 infection  and should not be used as the sole basis for treatment or other  patient management decisions.  A negative result may occur with  improper specimen collection / handling, submission of specimen other  than nasopharyngeal swab, presence of viral mutation(s) within the  areas targeted by this assay, and inadequate number of viral copies  (<250 copies / mL). A negative result must be combined with clinical  observations, patient history, and epidemiological information. If result is POSITIVE SARS-CoV-2 target nucleic acids are DETECTED. The SARS-CoV-2 RNA is generally detectable in upper and lower  respiratory  specimens dur ing the acute phase of infection.  Positive  results are indicative of active infection with SARS-CoV-2.  Clinical  correlation with patient history and other diagnostic information is  necessary to determine patient infection status.  Positive results do  not rule out bacterial infection or co-infection with other viruses. If result is PRESUMPTIVE POSTIVE SARS-CoV-2 nucleic acids MAY BE PRESENT.   A presumptive positive result was obtained on the submitted specimen  and confirmed on repeat testing.  While 2019 novel coronavirus  (SARS-CoV-2) nucleic acids may be present in the submitted sample  additional confirmatory testing may be necessary for epidemiological  and / or clinical management purposes  to differentiate between  SARS-CoV-2 and other Sarbecovirus currently known to infect humans.  If clinically indicated additional testing with an alternate test  methodology [id number]) is advised. The SARS-CoV-2 RNA is generally  detectable in upper and lower respiratory sp ecimens during the acute  phase of infection. The expected result is Negative. Fact Sheet for Patients:  StrictlyIdeas.no Fact Sheet for Healthcare Providers: BankingDealers.co.za This test is not yet approved or cleared by the [place] FDA and has been authorized for detection and/or diagnosis of SARS-CoV-2 by FDA under an Emergency Use Authorization (EUA).  This EUA will remain in effect (meaning this test can be used) for the duration of the COVID-19 declaration under Section 564(b)(1) of the Act, 21 U.S.C. section 360bbb-3(b)(1), unless the authorization is terminated or revoked sooner. Performed at [facility], [address] [place], [place] [address]   Expectorated sputum assessment w rflx to resp cult     Status: None   Collection Time: 06/15/19 10:49 PM   Specimen: Expectorated Sputum  Result Value Ref Range Status   Specimen  Description EXPECTORATED SPUTUM  Final   Special Requests Normal  Final   Sputum evaluation   Final    THIS SPECIMEN IS ACCEPTABLE FOR SPUTUM CULTURE Performed at [facility], [address] [place], [place] [address]    Report Status 06/16/2019 FINAL  Final  Culture, respiratory     Status: None   Collection Time: 06/15/19 10:49 PM  Result Value Ref Range Status   Specimen Description   Final    EXPECTORATED SPUTUM Performed at [facility], [address] [place], [place] [address]    Special Requests   Final    Normal Reflexed from [id number] Performed at [facility], [address] [place], [place] [address]    Gram Stain   Final    MODERATE WBC PRESENT, PREDOMINANTLY PMN ABUNDANT GRAM POSITIVE COCCI ABUNDANT GRAM  POSITIVE RODS    Culture   Final    ABUNDANT Consistent with normal respiratory flora. Performed at [facility], [place] [address] [place], [place] [address]    Report Status 06/18/2019 FINAL  Final  MRSA PCR Screening     Status: None   Collection Time: 06/17/19 11:20 AM   Specimen: Nasopharyngeal  Result Value Ref Range Status   MRSA by PCR NEGATIVE NEGATIVE Final    Comment:        The GeneXpert MRSA Assay (FDA approved for NASAL specimens only), is one component of a comprehensive MRSA colonization surveillance program. It is not intended to diagnose MRSA infection nor to guide or monitor treatment for MRSA infections. Performed at [facility], [address] [place], [place] [address]     Coagulation Studies: No results for input(s): LABPROT, INR in the last 72 hours.  Urinalysis: No results for input(s): COLORURINE, LABSPEC, PHURINE, GLUCOSEU, HGBUR, BILIRUBINUR, KETONESUR, PROTEINUR, UROBILINOGEN, NITRITE, LEUKOCYTESUR in the last 72 hours.  Invalid input(s): APPERANCEUR    Imaging: No results found.   Medications:   . sodium chloride 250 mL (06/18/19 2115)  . ceFEPime (MAXIPIME) IV      . amiodarone  200 mg Oral BID  . aspirin EC  81 mg Oral Daily  . atorvastatin  40 mg Oral q1800  . clopidogrel  75 mg Oral Daily  . diclofenac sodium  2 g Topical QID  . feeding supplement (ENSURE ENLIVE)  237 mL Oral BID BM  . heparin injection (subcutaneous)  5,000 Units Subcutaneous Q8H  . ipratropium-albuterol  3 mL Nebulization Q6H  . lidocaine  20 mL Intradermal Once  . linaclotide  145 mcg Oral QAC breakfast  . methimazole  40 mg Oral Daily  . mometasone-formoterol  2 puff Inhalation BID  . multivitamin  1 tablet Oral QHS  . multivitamin with minerals  1 tablet Oral Daily  . pantoprazole (PROTONIX) IV  40 mg Intravenous Q12H  . polyethylene glycol  17 g Oral Daily  . senna-docusate  1 tablet Oral Daily  . sodium chloride flush  10-40 mL Intracatheter Q12H  . sodium chloride flush  3 mL Intravenous Q12H  . sodium zirconium cyclosilicate  10 g Oral BID  . tiotropium  1 capsule Inhalation Daily   sodium chloride, acetaminophen **OR** acetaminophen, alum & mag hydroxide-simeth, guaiFENesin-dextromethorphan, menthol-cetylpyridinium, ondansetron **OR** ondansetron (ZOFRAN) IV, promethazine, sodium chloride flush  Assessment/ Plan:  Stephen [name] is a 53 y.o. black male withchronic systolic heart failure ejection fraction 10-15%, prior history of cardiac arrest, hyperlipidemia, nephrolithiasis, hypertension, history of TIA, was admitted to [facility] on6/12/2019  for acute exacerbation of systolic congestive heart failure, EF 10%  1. Acute renal failure with hyperkalemia and metabolic acidosis onChronic kidney disease stage III baseline creatinine 1.85, GFR of 47 on 01/24/19. Bland urine.  Concerning for pulmonary edema and worsening renal function and hyperkalemia.  - Consult vascular surgery - Place temp HD catheter - Hemodialysis treatment today emergently.   2. Hypotension with systolic congestive heart failure:   - Discontinued carvedilol - Appreciate cardiology  input.     LOS: 8 Zitlaly Malson 6/20/20209:06 AM

## 2019-06-21 ENCOUNTER — Inpatient Hospital Stay: Payer: PRIVATE HEALTH INSURANCE

## 2019-06-21 LAB — BLOOD GAS, ARTERIAL
Acid-base deficit: 1.4 mmol/L (ref 0.0–2.0)
Bicarbonate: 20.6 mmol/L (ref 20.0–28.0)
O2 Saturation: 96.9 %
Patient temperature: 37
pCO2 arterial: 27 mmHg — ABNORMAL LOW (ref 32.0–48.0)
pH, Arterial: 7.49 — ABNORMAL HIGH (ref 7.350–7.450)
pO2, Arterial: 82 mmHg — ABNORMAL LOW (ref 83.0–108.0)

## 2019-06-21 LAB — MAGNESIUM: Magnesium: 2.9 mg/dL — ABNORMAL HIGH (ref 1.7–2.4)

## 2019-06-21 LAB — GLUCOSE, CAPILLARY: Glucose-Capillary: 148 mg/dL — ABNORMAL HIGH (ref 70–99)

## 2019-06-21 MED ORDER — SODIUM CHLORIDE 0.9 % IV SOLN
2.0000 g | Freq: Every day | INTRAVENOUS | Status: DC
  Administered 2019-06-21 – 2019-06-22 (×2): 2 g via INTRAVENOUS
  Filled 2019-06-21 (×3): qty 2

## 2019-06-21 NOTE — Progress Notes (Signed)
[facility]  ROUNDING NOTE   Subjective:   First hemodialysis treatment yesterday. UF of 1 liter  Shortness of breath this morning. Placed on second hemodialysis treatment today. Sequential only. UF goal of 1.5-2 liters  Objective:  Vital signs in last 24 hours:  Temp:  [97.6 F (36.4 C)-98 F (36.7 C)] 97.8 F (36.6 C) (06/21 1300) Pulse Rate:  [62-76] 66 (06/21 1345) Resp:  [19-28] 19 (06/21 1345) BP: (80-122)/(69-99) 80/69 (06/21 1345) SpO2:  [92 %-100 %] 92 % (06/21 1345) Weight:  [99.8 kg-101 kg] 99.8 kg (06/21 1255)  Weight change: -0.24 kg Filed Weights   06/20/19 0346 06/21/19 0359 06/21/19 1255  Weight: 101.2 kg 101 kg 99.8 kg    Intake/Output: I/O last 3 completed shifts: In: 0  Out: 1350 [Urine:350; Other:1000]   Intake/Output this shift:  Total I/O In: 120 [P.O.:120] Out: -   Physical Exam: General: Tachypnic, in mild respiratory distress  Head: Normocephalic, atraumatic. Moist oral mucosal membranes  Eyes: Anicteric, PERRL  Neck: Supple, trachea midline  Lungs:  Crackles bilaterally Right > Left  Heart: Regular rate and rhythm  Abdomen:  Soft, nontender, +distended  Extremities: no peripheral edema.  Neurologic: Nonfocal, moving all four extremities  Skin: No lesions       Basic Metabolic Panel: Recent Labs  Lab 06/17/19 0249 06/18/19 0415 06/19/19 0801 06/20/19 0600 06/20/19 1030 06/21/19 0452  NA 133*  133* 131* 131* 130* 129*  --   K 6.2*  6.2* 5.3* 4.5 4.7 4.3  --   CL 97*  97* 97* 94* 93* 91*  --   CO2 21*  21* 19* 20* 21* 22  --   GLUCOSE 144*  144* 152* 125* 153* 127*  --   BUN 74*  72* 93* 103* 111* 105*  --   CREATININE 4.17*  4.06* 3.99* 3.38* 3.30* 3.10*  --   CALCIUM 9.7  9.7 9.4 9.3 9.3 9.1  --   MG  --   --   --   --   --  2.9*  PHOS 4.8*  --  4.9*  --   --   --     Liver Function Tests: Recent Labs  Lab 06/17/19 0249 06/19/19 0801  ALBUMIN 3.3* 2.8*   No results for input(s): LIPASE,  AMYLASE in the last 168 hours. No results for input(s): AMMONIA in the last 168 hours.  CBC: Recent Labs  Lab 06/15/19 1901 06/16/19 0343 06/17/19 2107 06/18/19 0415 06/19/19 0801  WBC  --  11.6*  --  11.2* 10.0  HGB 13.5 13.2 12.0* 12.1* 11.6*  HCT 42.1 41.6 36.6* 36.9* 35.3*  MCV  --  90.4  --  86.4 86.7  PLT  --  295  --  250 227    Cardiac Enzymes: No results for input(s): CKTOTAL, CKMB, CKMBINDEX, TROPONINI in the last 168 hours.  BNP: Invalid input(s): POCBNP  CBG: Recent Labs  Lab 06/15/19 0237 06/21/19 1152  GLUCAP 180* 148*    Microbiology: Results for orders placed or performed during the hospital encounter of 06/12/19  SARS Coronavirus 2 (CEPHEID- Performed in [place] hospital lab), Hosp Order     Status: None   Collection Time: 06/12/19  9:52 PM   Specimen: Nasopharyngeal Swab  Result Value Ref Range Status   SARS Coronavirus 2 NEGATIVE NEGATIVE Final    Comment: (NOTE) If result is NEGATIVE SARS-CoV-2 target nucleic acids are NOT DETECTED. The SARS-CoV-2 RNA is generally detectable in upper and lower  respiratory specimens during the acute phase of infection. The lowest  concentration of SARS-CoV-2 viral copies this assay can detect is 250  copies / mL. A negative result does not preclude SARS-CoV-2 infection  and should not be used as the sole basis for treatment or other  patient management decisions.  A negative result may occur with  improper specimen collection / handling, submission of specimen other  than nasopharyngeal swab, presence of viral mutation(s) within the  areas targeted by this assay, and inadequate number of viral copies  (<250 copies / mL). A negative result must be combined with clinical  observations, patient history, and epidemiological information. If result is POSITIVE SARS-CoV-2 target nucleic acids are DETECTED. The SARS-CoV-2 RNA is generally detectable in upper and lower  respiratory specimens dur ing the acute  phase of infection.  Positive  results are indicative of active infection with SARS-CoV-2.  Clinical  correlation with patient history and other diagnostic information is  necessary to determine patient infection status.  Positive results do  not rule out bacterial infection or co-infection with other viruses. If result is PRESUMPTIVE POSTIVE SARS-CoV-2 nucleic acids MAY BE PRESENT.   A presumptive positive result was obtained on the submitted specimen  and confirmed on repeat testing.  While 2019 novel coronavirus  (SARS-CoV-2) nucleic acids may be present in the submitted sample  additional confirmatory testing may be necessary for epidemiological  and / or clinical management purposes  to differentiate between  SARS-CoV-2 and other Sarbecovirus currently known to infect humans.  If clinically indicated additional testing with an alternate test  methodology [id number]) is advised. The SARS-CoV-2 RNA is generally  detectable in upper and lower respiratory sp ecimens during the acute  phase of infection. The expected result is Negative. Fact Sheet for Patients:  StrictlyIdeas.no Fact Sheet for Healthcare Providers: BankingDealers.co.za This test is not yet approved or cleared by the [place] FDA and has been authorized for detection and/or diagnosis of SARS-CoV-2 by FDA under an Emergency Use Authorization (EUA).  This EUA will remain in effect (meaning this test can be used) for the duration of the COVID-19 declaration under Section 564(b)(1) of the Act, 21 U.S.C. section 360bbb-3(b)(1), unless the authorization is terminated or revoked sooner. Performed at [facility], [address] [place], [place] [address]   Expectorated sputum assessment w rflx to resp cult     Status: None   Collection Time: 06/15/19 10:49 PM   Specimen: Expectorated Sputum  Result Value Ref Range Status   Specimen Description EXPECTORATED SPUTUM   Final   Special Requests Normal  Final   Sputum evaluation   Final    THIS SPECIMEN IS ACCEPTABLE FOR SPUTUM CULTURE Performed at [facility], [address] [place], [place] [address]    Report Status 06/16/2019 FINAL  Final  Culture, respiratory     Status: None   Collection Time: 06/15/19 10:49 PM  Result Value Ref Range Status   Specimen Description   Final    EXPECTORATED SPUTUM Performed at [facility], [address], [place], [place] [address]    Special Requests   Final    Normal Reflexed from [id number] Performed at [facility], [address] [place], [place] [address]    Gram Stain   Final    MODERATE WBC PRESENT, PREDOMINANTLY PMN ABUNDANT GRAM POSITIVE COCCI ABUNDANT GRAM POSITIVE RODS    Culture   Final    ABUNDANT Consistent with normal respiratory flora. Performed at [facility], [place] [address], [place], [place] [address]    Report Status 06/18/2019 FINAL  Final  MRSA PCR Screening     Status: None   Collection Time: 06/17/19 11:20 AM   Specimen: Nasopharyngeal  Result Value Ref Range Status   MRSA by PCR NEGATIVE NEGATIVE Final    Comment:        The GeneXpert MRSA Assay (FDA approved for NASAL specimens only), is one component of a comprehensive MRSA colonization surveillance program. It is not intended to diagnose MRSA infection nor to guide or monitor treatment for MRSA infections. Performed at [facility], [address] [place], [place] [address]     Coagulation Studies: No results for input(s): LABPROT, INR in the last 72 hours.  Urinalysis: No results for input(s): COLORURINE, LABSPEC, PHURINE, GLUCOSEU, HGBUR, BILIRUBINUR, KETONESUR, PROTEINUR, UROBILINOGEN, NITRITE, LEUKOCYTESUR in the last 72 hours.  Invalid input(s): APPERANCEUR    Imaging: Dg Chest Port 1 View  Result Date: 06/21/2019 CLINICAL DATA:  Worsening shortness of breath. EXAM: PORTABLE CHEST 1 VIEW COMPARISON:   06/21/2019 earlier FINDINGS: Left-sided pacemaker unchanged. Lungs are adequately inflated demonstrate stable bilateral patchy airspace process right worse than left likely multifocal pneumonia. Stable cardiomegaly. Remainder of the exam is unchanged. IMPRESSION: Stable bilateral patchy multifocal airspace process right worse than left likely multifocal pneumonia. Stable cardiomegaly. Electronically Signed   By: Marin Olp M.D.   On: 06/21/2019 08:45   Dg Chest Port 1 View  Result Date: 06/21/2019 CLINICAL DATA:  Shortness of breath. EXAM: PORTABLE CHEST 1 VIEW COMPARISON:  06/14/2019 FINDINGS: Single lead left-sided pacemaker remains in place. Prominent cardiomegaly is unchanged from prior exam. Worsening diffuse bilateral airspace opacities since prior exam. Suspect small bilateral pleural effusion. No pneumothorax. IMPRESSION: Worsening diffuse bilateral airspace opacities, may represent pulmonary edema or multifocal pneumonia. Probable bilateral pleural effusions. Cardiomegaly is unchanged. Electronically Signed   By: Keith Rake M.D.   On: 06/21/2019 00:58     Medications:   . sodium chloride 500 mL (06/20/19 1409)  . ceFEPime (MAXIPIME) IV     . amiodarone  200 mg Oral BID  . aspirin EC  81 mg Oral Daily  . atorvastatin  40 mg Oral q1800  . Chlorhexidine Gluconate Cloth  6 each Topical Q0600  . clopidogrel  75 mg Oral Daily  . diclofenac sodium  2 g Topical QID  . feeding supplement (ENSURE ENLIVE)  237 mL Oral BID BM  . heparin injection (subcutaneous)  5,000 Units Subcutaneous Q8H  . ipratropium-albuterol  3 mL Nebulization Q6H  . linaclotide  145 mcg Oral QAC breakfast  . methimazole  40 mg Oral Daily  . mometasone-formoterol  2 puff Inhalation BID  . multivitamin  1 tablet Oral QHS  . multivitamin with minerals  1 tablet Oral Daily  . pantoprazole (PROTONIX) IV  40 mg Intravenous Q12H  . polyethylene glycol  17 g Oral Daily  . senna-docusate  1 tablet Oral Daily  .  sodium chloride flush  10-40 mL Intracatheter Q12H  . sodium zirconium cyclosilicate  10 g Oral BID  . tiotropium  1 capsule Inhalation Daily   sodium chloride, acetaminophen **OR** acetaminophen, alum & mag hydroxide-simeth, guaiFENesin-dextromethorphan, menthol-cetylpyridinium, ondansetron **OR** ondansetron (ZOFRAN) IV, promethazine, sodium chloride flush  Assessment/ Plan:  [name] is a 53 y.o. black male withchronic systolic heart failure ejection fraction 10-15%, prior history of cardiac arrest, hyperlipidemia, nephrolithiasis, hypertension, history of TIA, was admitted to [facility] on6/12/2019  for acute  exacerbation of systolic congestive heart failure, EF 10%  1. Acute renal failure with hyperkalemia and metabolic acidosis onChronic kidney disease stage III baseline creatinine 1.85, GFR of 47 on 01/24/19. Bland urine.  Hemodialysis treatment today. Sequential only. UF goal of 1.5-2 liters.  Plan on third dialysis treatment tomorrow for volume overload and pulmonary edema.   2. Hypotension with systolic congestive heart failure:   - Appreciate cardiology, vascular and cardiology input.   3. Hyponatremia: secondary to hypervolemia and congestive heart failure.   4. Anemia of chronic kidney disease: hemoglobin 11.6.     LOS: 9 Stephen [name] 6/21/20202:06 PM

## 2019-06-21 NOTE — Progress Notes (Signed)
PT Cancellation Note  Patient Details Name: Stephen [name] MRN: [id number] DOB: 05-06-1966   Cancelled Treatment:    Reason Eval/Treat Not Completed: Other (comment) Patient taken to HD at this time, severely SOB with speaking.   Shelton Silvas PT, DPT  Shelton Silvas 06/21/2019, 1:58 PM

## 2019-06-21 NOTE — Plan of Care (Signed)
  Problem: Activity: Goal: Risk for activity intolerance will decrease Outcome: Progressing   Problem: Pain Managment: Goal: General experience of comfort will improve Outcome: Progressing   Problem: Safety: Goal: Ability to remain free from injury will improve Outcome: Progressing   Problem: Skin Integrity: Goal: Risk for impaired skin integrity will decrease Outcome: Progressing   Problem: Cardiac: Goal: Ability to achieve and maintain adequate cardiopulmonary perfusion will improve Outcome: Progressing

## 2019-06-21 NOTE — Progress Notes (Signed)
[place] [facility] [place] NAME: Stephen [name]    MR#:  [id number]  DATE OF BIRTH:  [birth year]  SUBJECTIVE:  Patient continues to be less  short of breath today requiring 4 Liter  patient not able to complete a sentence without getting short of breath. He has a wet cough. No fever.  Overall a very poor historian REVIEW OF SYSTEMS:  CONSTITUTIONAL: No fever, positive fatigue and weakness.  EYES: No blurred or double vision.  EARS, NOSE, AND THROAT: No tinnitus or ear pain.  RESPIRATORY: + cough, improving shortness of breath, denies wheezing or hemoptysis.  CARDIOVASCULAR: No chest pain, orthopnea, edema.  GASTROINTESTINAL: Reporting nausea, novomiting, denies diarrhea or abdominal pain.  GENITOURINARY: No dysuria, hematuria.  ENDOCRINE: No polyuria, nocturia,  HEMATOLOGY: No anemia, easy bruising or bleeding SKIN: No rash or lesion. MUSCULOSKELETAL: No joint pain or arthritis.   NEUROLOGIC: No tingling, numbness, weakness.  PSYCHIATRY: No anxiety or depression.   DRUG ALLERGIES:   Allergies  Allergen Reactions  . Lisinopril Cough    VITALS:  Blood pressure 97/80, pulse 65, temperature 97.8 F (36.6 C), temperature source Oral, resp. rate (!) 24, height 6' 1.5" (1.867 m), weight 101 kg, SpO2 99 %.  PHYSICAL EXAMINATION:  GENERAL:  53 y.o.-year-old patient lying in the bed with mild acute distress. Chronically ill EYES: Pupils equal, round, reactive to light and accommodation. No scleral icterus. Extraocular muscles intact.  HEENT: Head atraumatic, normocephalic. Oropharynx and nasopharynx dry NECK:  Supple, no jugular venous distention. No thyroid enlargement, no tenderness.  LUNGS: decreased breath sounds bilaterally, no wheezing, positive rales,rhonchi . No use of accessory muscles of respiration.  CARDIOVASCULAR: S1, S2 normal. No murmurs, rubs, or gallops.  ABDOMEN: Soft, nontender, nondistended. Bowel sounds  present. EXTREMITIES: No pedal edema, cyanosis, or clubbing.  NEUROLOGIC: Cranial nerves II through XII are intact. Muscle strength at his baseline in all extremities. Sensation intact. Gait not checked. Generalized weakness PSYCHIATRIC:  patient is alert and oriented x 2.  SKIN: No obvious rash, lesion, or ulcer.    LABORATORY PANEL:   CBC Recent Labs  Lab 06/19/19 0801  WBC 10.0  HGB 11.6*  HCT 35.3*  PLT 227   ------------------------------------------------------------------------------------------------------------------  Chemistries  Recent Labs  Lab 06/20/19 1030 06/21/19 0452  NA 129*  --   K 4.3  --   CL 91*  --   CO2 22  --   GLUCOSE 127*  --   BUN 105*  --   CREATININE 3.10*  --   CALCIUM 9.1  --   MG  --  2.9*   ------------------------------------------------------------------------------------------------------------------  Cardiac Enzymes No results for input(s): TROPONINI in the last 168 hours. ------------------------------------------------------------------------------------------------------------------  RADIOLOGY:  Dg Chest Port 1 View  Result Date: 06/21/2019 CLINICAL DATA:  Worsening shortness of breath. EXAM: PORTABLE CHEST 1 VIEW COMPARISON:  06/21/2019 earlier FINDINGS: Left-sided pacemaker unchanged. Lungs are adequately inflated demonstrate stable bilateral patchy airspace process right worse than left likely multifocal pneumonia. Stable cardiomegaly. Remainder of the exam is unchanged. IMPRESSION: Stable bilateral patchy multifocal airspace process right worse than left likely multifocal pneumonia. Stable cardiomegaly. Electronically Signed   By: Marin Olp M.D.   On: 06/21/2019 08:45   Dg Chest Port 1 View  Result Date: 06/21/2019 CLINICAL DATA:  Shortness of breath. EXAM: PORTABLE CHEST 1 VIEW COMPARISON:  06/14/2019 FINDINGS: Single lead left-sided pacemaker remains in place. Prominent cardiomegaly is unchanged from prior exam.  Worsening diffuse bilateral airspace opacities since  prior exam. Suspect small bilateral pleural effusion. No pneumothorax. IMPRESSION: Worsening diffuse bilateral airspace opacities, may represent pulmonary edema or multifocal pneumonia. Probable bilateral pleural effusions. Cardiomegaly is unchanged. Electronically Signed   By: Keith Rake M.D.   On: 06/21/2019 00:58    EKG:   Orders placed or performed during the hospital encounter of 06/12/19  . EKG 12-Lead  . EKG 12-Lead  . ED EKG  . ED EKG  . EKG 12-Lead  . EKG 12-Lead  . EKG 12-Lead  . EKG 12-Lead    ASSESSMENT AND PLAN:  Stephen [name]  is a 53 y.o. male who presents  to the ED with a complaint of shortness of breath and chest discomfort.  He also states he has had some abdominal swelling and recent increase in weight of about 8 pounds  #Acute on chronic kidney disease stage III concerning for pulmonary edema and worsening renal function but hyperkalemia -Patient seen by nephrology encouraged to have hemodialysis. Patient now agreeable for hemodialysis. (june20th strated) -Dr. Juleen China spoke with patient's sister on the phone. She is agreement with plan. -patient currently is not a candidate for transfer to [facility]. He is hemodynamically not stable.   # HCAPS Cont cefepime -worsening right LL infiltrates and elevated Procalcitonin -flutter vale if pt follwos instructions  #Intractable nausea and vomiting-could be from GERD--resolved -PPI twice daily, Carafate 1 g 4 times a day -Negative H. pylori stool test -Patient had a upper GI series which was abnormal however GI feels that he is too high risk for any procedure -continue renal diet  #Hyperkalemia Potassium  4.5  # Acute on chronic systolic congestive heart failure with history of cardiomyopathy- nonischemic, ejection fraction around 10 percent with AICD -Seen by cardiology [name].  Appreciate his recommendations.  [name] has discussed with the family  members -Daily weights, intake and output -Continue home medication aspirin 81 mg, Plavix, statin -Continue amiodarone 200 mg 2 times a day -Goal is to keep his potassium at 4 and magnesium at 2.0 -patient not a candidate for transfer to [facility] at present.  #COPD chronic no exacerbation Continue bronchodilators  #Essential hypertension Currently patient is hypotensive hold blood pressure medicines-- Coreg on hold  #Hyperlipidemia continue home dose statin  #DVT prophylaxis with HEPARIN  Dr. Juleen China I  spoke with sister carmen Aggie Hacker  on the phone and updated. CODE STATUS: fc   TOTAL TIME TAKING CARE OF THIS PATIENT: 36  minutes.   POSSIBLE D/C ??  Note: This dictation was prepared with Dragon dictation along with smaller phrase technology. Any transcriptional errors that result from this process are unintentional.   Fritzi Mandes M.D on 06/21/2019 at 1:27 PM  Between 7am to 6pm - Pager - [phone] After 6pm go to [email or web address] - password [email or web address] [facility]  Office  [phone]  CC: Primary care physician; Valerie Roys, DO

## 2019-06-21 NOTE — Progress Notes (Signed)
Hd start. [place] bedside    06/21/19 1300  Vital Signs  Temp 97.8 F (36.6 C)  Temp Source Oral  Pulse Rate 65  Pulse Rate Source Monitor  Resp (!) 24  BP 97/80  BP Location Left Arm  BP Method Automatic  Patient Position (if appropriate) Lying  During Hemodialysis Assessment  Blood Flow Rate (mL/min) 200 mL/min  Arterial Pressure (mmHg) -200 mmHg  Venous Pressure (mmHg) 120 mmHg  Transmembrane Pressure (mmHg) 50 mmHg  Ultrafiltration Rate (mL/min) 950 mL/min  Dialysate Flow Rate (mL/min) 0 ml/min  Conductivity: Machine  14  HD Safety Checks Performed Yes  Dialysis Fluid Bolus Normal Saline  Bolus Amount (mL) 250 mL  Intra-Hemodialysis Comments Tx initiated (cvc care per policy)

## 2019-06-21 NOTE — Progress Notes (Signed)
Patient reports feeling SOB and "feels worse than earlier". Patient noted to be tachypnea, crackles heard throughout the lungs, oxygen saturations 93% on 10L. This RN notified Seals, NP to come to bedside to evaluate patient.   Update: Verbal order given to obtain ABG, and chest xray.   Update: NP Seals notified about results of chest xray and ABG. No new orders at this time. Patient resting in bed. Will continue to monitor.   Iran Sizer M

## 2019-06-22 LAB — CBC
HCT: 33.6 % — ABNORMAL LOW (ref 39.0–52.0)
Hemoglobin: 11 g/dL — ABNORMAL LOW (ref 13.0–17.0)
MCH: 28.4 pg (ref 26.0–34.0)
MCHC: 32.7 g/dL (ref 30.0–36.0)
MCV: 86.8 fL (ref 80.0–100.0)
Platelets: 218 10*3/uL (ref 150–400)
RBC: 3.87 MIL/uL — ABNORMAL LOW (ref 4.22–5.81)
RDW: 17 % — ABNORMAL HIGH (ref 11.5–15.5)
WBC: 9.8 10*3/uL (ref 4.0–10.5)
nRBC: 5.8 % — ABNORMAL HIGH (ref 0.0–0.2)

## 2019-06-22 LAB — RENAL FUNCTION PANEL
Albumin: 2.5 g/dL — ABNORMAL LOW (ref 3.5–5.0)
Anion gap: 14 (ref 5–15)
BUN: 96 mg/dL — ABNORMAL HIGH (ref 6–20)
CO2: 22 mmol/L (ref 22–32)
Calcium: 8.6 mg/dL — ABNORMAL LOW (ref 8.9–10.3)
Chloride: 94 mmol/L — ABNORMAL LOW (ref 98–111)
Creatinine, Ser: 2.23 mg/dL — ABNORMAL HIGH (ref 0.61–1.24)
GFR calc Af Amer: 37 mL/min — ABNORMAL LOW (ref >60)
GFR calc non Af Amer: 32 mL/min — ABNORMAL LOW (ref >60)
Glucose, Bld: 186 mg/dL — ABNORMAL HIGH (ref 70–99)
Phosphorus: 3.3 mg/dL (ref 2.5–4.6)
Potassium: 3.2 mmol/L — ABNORMAL LOW (ref 3.5–5.1)
Sodium: 130 mmol/L — ABNORMAL LOW (ref 135–145)

## 2019-06-22 MED ORDER — HEPARIN SODIUM (PORCINE) 1000 UNIT/ML DIALYSIS
1000.0000 [IU] | INTRAMUSCULAR | Status: DC | PRN
  Filled 2019-06-22: qty 1

## 2019-06-22 MED ORDER — POTASSIUM CHLORIDE CRYS ER 20 MEQ PO TBCR
20.0000 meq | EXTENDED_RELEASE_TABLET | Freq: Once | ORAL | Status: DC
  Filled 2019-06-22: qty 1

## 2019-06-22 MED ORDER — SODIUM CHLORIDE 0.9 % IV SOLN: 100.0000 mL | INTRAVENOUS | Status: DC | PRN

## 2019-06-22 NOTE — Progress Notes (Signed)
[facility]  ROUNDING NOTE   Subjective:   Patient seen during dialysis treatment Tolerating fair Concerned about his weak heart Irregular rhythm on telemetry, bradycardic in the 60s    HEMODIALYSIS FLOWSHEET:  Blood Flow Rate (mL/min): (P) 250 mL/min Arterial Pressure (mmHg): (P) -140 mmHg Venous Pressure (mmHg): (P) 150 mmHg Transmembrane Pressure (mmHg): (P) 60 mmHg Ultrafiltration Rate (mL/min): (P) 1000 mL/min Dialysate Flow Rate (mL/min): (P) 600 ml/min Conductivity: Machine : 14 Conductivity: Machine : 14 Dialysis Fluid Bolus: Normal Saline Bolus Amount (mL): 250 mL    Objective:  Vital signs in last 24 hours:  Temp:  [97.6 F (36.4 C)-97.8 F (36.6 C)] 97.6 F (36.4 C) (06/22 0732) Pulse Rate:  [64-93] 91 (06/22 1409) Resp:  [20-26] 20 (06/22 1409) BP: (97-112)/(79-96) 107/81 (06/22 1409) SpO2:  [94 %-100 %] 96 % (06/22 1409) Weight:  [97.2 kg-97.9 kg] 97.9 kg (06/22 1037)  Weight change: -1.2 kg Filed Weights   06/21/19 1515 06/22/19 0420 06/22/19 1037  Weight: 97.9 kg 97.2 kg 97.9 kg    Intake/Output: I/O last 3 completed shifts: In: 360 [P.O.:360] Out: 1600 [Urine:100; Other:1500]   Intake/Output this shift:  Total I/O In: 120 [P.O.:120] Out: 460 [Urine:460]  Physical Exam: General:  No acute distress, laying in the bed  Head: Normocephalic, atraumatic. Moist oral mucosal membranes  Eyes: Anicteric,  Neck: Supple, trachea midline  Lungs:   Shallow breathing effort, decreased breath sounds  Heart:  Irregular bradycardic  Abdomen:  Soft, nontender, +distended  Extremities:  Trace peripheral edema.  Neurologic: Nonfocal, moving all four extremities  Skin: No lesions   Right femoral temporary catheter    Basic Metabolic Panel: Recent Labs  Lab 06/17/19 0249 06/18/19 0415 06/19/19 0801 06/20/19 0600 06/20/19 1030 06/21/19 0452 06/22/19 1237  NA 133*  133* 131* 131* 130* 129*  --  130*  K 6.2*  6.2* 5.3* 4.5 4.7 4.3   --  3.2*  CL 97*  97* 97* 94* 93* 91*  --  94*  CO2 21*  21* 19* 20* 21* 22  --  22  GLUCOSE 144*  144* 152* 125* 153* 127*  --  186*  BUN 74*  72* 93* 103* 111* 105*  --  96*  CREATININE 4.17*  4.06* 3.99* 3.38* 3.30* 3.10*  --  2.23*  CALCIUM 9.7  9.7 9.4 9.3 9.3 9.1  --  8.6*  MG  --   --   --   --   --  2.9*  --   PHOS 4.8*  --  4.9*  --   --   --  3.3    Liver Function Tests: Recent Labs  Lab 06/17/19 0249 06/19/19 0801 06/22/19 1237  ALBUMIN 3.3* 2.8* 2.5*   No results for input(s): LIPASE, AMYLASE in the last 168 hours. No results for input(s): AMMONIA in the last 168 hours.  CBC: Recent Labs  Lab 06/16/19 0343 06/17/19 2107 06/18/19 0415 06/19/19 0801 06/22/19 1237  WBC 11.6*  --  11.2* 10.0 9.8  HGB 13.2 12.0* 12.1* 11.6* 11.0*  HCT 41.6 36.6* 36.9* 35.3* 33.6*  MCV 90.4  --  86.4 86.7 86.8  PLT 295  --  250 227 218    Cardiac Enzymes: No results for input(s): CKTOTAL, CKMB, CKMBINDEX, TROPONINI in the last 168 hours.  BNP: Invalid input(s): POCBNP  CBG: Recent Labs  Lab 06/21/19 1152  GLUCAP 148*    Microbiology: Results for orders placed or performed during the hospital encounter of 06/12/19  SARS Coronavirus 2 (CEPHEID- Performed in [place] hospital lab), Hosp Order     Status: None   Collection Time: 06/12/19  9:52 PM   Specimen: Nasopharyngeal Swab  Result Value Ref Range Status   SARS Coronavirus 2 NEGATIVE NEGATIVE Final    Comment: (NOTE) If result is NEGATIVE SARS-CoV-2 target nucleic acids are NOT DETECTED. The SARS-CoV-2 RNA is generally detectable in upper and lower  respiratory specimens during the acute phase of infection. The lowest  concentration of SARS-CoV-2 viral copies this assay can detect is 250  copies / mL. A negative result does not preclude SARS-CoV-2 infection  and should not be used as the sole basis for treatment or other  patient management decisions.  A negative result may occur with  improper specimen  collection / handling, submission of specimen other  than nasopharyngeal swab, presence of viral mutation(s) within the  areas targeted by this assay, and inadequate number of viral copies  (<250 copies / mL). A negative result must be combined with clinical  observations, patient history, and epidemiological information. If result is POSITIVE SARS-CoV-2 target nucleic acids are DETECTED. The SARS-CoV-2 RNA is generally detectable in upper and lower  respiratory specimens dur ing the acute phase of infection.  Positive  results are indicative of active infection with SARS-CoV-2.  Clinical  correlation with patient history and other diagnostic information is  necessary to determine patient infection status.  Positive results do  not rule out bacterial infection or co-infection with other viruses. If result is PRESUMPTIVE POSTIVE SARS-CoV-2 nucleic acids MAY BE PRESENT.   A presumptive positive result was obtained on the submitted specimen  and confirmed on repeat testing.  While 2019 novel coronavirus  (SARS-CoV-2) nucleic acids may be present in the submitted sample  additional confirmatory testing may be necessary for epidemiological  and / or clinical management purposes  to differentiate between  SARS-CoV-2 and other Sarbecovirus currently known to infect humans.  If clinically indicated additional testing with an alternate test  methodology [phone]) is advised. The SARS-CoV-2 RNA is generally  detectable in upper and lower respiratory sp ecimens during the acute  phase of infection. The expected result is Negative. Fact Sheet for Patients:  StrictlyIdeas.no Fact Sheet for Healthcare Providers: BankingDealers.co.za This test is not yet approved or cleared by the [place] FDA and has been authorized for detection and/or diagnosis of SARS-CoV-2 by FDA under an Emergency Use Authorization (EUA).  This EUA will remain in effect  (meaning this test can be used) for the duration of the COVID-19 declaration under Section 564(b)(1) of the Act, 21 U.S.C. section 360bbb-3(b)(1), unless the authorization is terminated or revoked sooner. Performed at [facility], [address] [place], [place] [address]   Expectorated sputum assessment w rflx to resp cult     Status: None   Collection Time: 06/15/19 10:49 PM   Specimen: Expectorated Sputum  Result Value Ref Range Status   Specimen Description EXPECTORATED SPUTUM  Final   Special Requests Normal  Final   Sputum evaluation   Final    THIS SPECIMEN IS ACCEPTABLE FOR SPUTUM CULTURE Performed at [facility], [address], [place], [place] [address]    Report Status 06/16/2019 FINAL  Final  Culture, respiratory     Status: None   Collection Time: 06/15/19 10:49 PM  Result Value Ref Range Status   Specimen Description   Final    EXPECTORATED SPUTUM Performed at [facility], [address] [place],  [place] [address]    Special Requests   Final    Normal Reflexed from [id number] Performed at [facility], [address] [place], [place] [address]    Gram Stain   Final    MODERATE WBC PRESENT, PREDOMINANTLY PMN ABUNDANT GRAM POSITIVE COCCI ABUNDANT GRAM POSITIVE RODS    Culture   Final    ABUNDANT Consistent with normal respiratory flora. Performed at [facility], [place] [address] [place], [place] [address]    Report Status 06/18/2019 FINAL  Final  MRSA PCR Screening     Status: None   Collection Time: 06/17/19 11:20 AM   Specimen: Nasopharyngeal  Result Value Ref Range Status   MRSA by PCR NEGATIVE NEGATIVE Final    Comment:        The GeneXpert MRSA Assay (FDA approved for NASAL specimens only), is one component of a comprehensive MRSA colonization surveillance program. It is not intended to diagnose MRSA infection nor to guide or monitor treatment for MRSA infections. Performed at [facility], [address] [place], [place] [address]     Coagulation Studies: No results for input(s): LABPROT, INR in the last 72 hours.  Urinalysis: No results for input(s): COLORURINE, LABSPEC, PHURINE, GLUCOSEU, HGBUR, BILIRUBINUR, KETONESUR, PROTEINUR, UROBILINOGEN, NITRITE, LEUKOCYTESUR in the last 72 hours.  Invalid input(s): APPERANCEUR    Imaging: Dg Chest Port 1 View  Result Date: 06/21/2019 CLINICAL DATA:  Worsening shortness of breath. EXAM: PORTABLE CHEST 1 VIEW COMPARISON:  06/21/2019 earlier FINDINGS: Left-sided pacemaker unchanged. Lungs are adequately inflated demonstrate stable bilateral patchy airspace process right worse than left likely multifocal pneumonia. Stable cardiomegaly. Remainder of the exam is unchanged. IMPRESSION: Stable bilateral patchy multifocal airspace process right worse than left likely multifocal pneumonia. Stable cardiomegaly. Electronically Signed   By: Marin Olp M.D.   On: 06/21/2019 08:45   Dg Chest Port 1 View  Result Date: 06/21/2019 CLINICAL DATA:  Shortness of breath. EXAM: PORTABLE CHEST 1 VIEW COMPARISON:  06/14/2019 FINDINGS: Single lead left-sided pacemaker remains in place. Prominent cardiomegaly is unchanged from prior exam. Worsening diffuse bilateral airspace opacities since prior exam. Suspect small bilateral pleural effusion. No pneumothorax. IMPRESSION: Worsening diffuse bilateral airspace opacities, may represent pulmonary edema or multifocal pneumonia. Probable bilateral pleural effusions. Cardiomegaly is unchanged. Electronically Signed   By: Keith Rake M.D.   On: 06/21/2019 00:58     Medications:   . sodium chloride 500 mL (06/20/19 1409)  . sodium chloride    . sodium chloride    . ceFEPime (MAXIPIME) IV 2 g (06/21/19 1652)   . amiodarone  200 mg Oral BID  . aspirin EC  81 mg Oral Daily  . atorvastatin  40 mg Oral q1800  . Chlorhexidine Gluconate Cloth  6 each Topical Q0600  . clopidogrel  75 mg Oral Daily  .  diclofenac sodium  2 g Topical QID  . feeding supplement (ENSURE ENLIVE)  237 mL Oral BID BM  . heparin injection (subcutaneous)  5,000 Units Subcutaneous Q8H  . ipratropium-albuterol  3 mL Nebulization Q6H  . linaclotide  145 mcg Oral QAC breakfast  . methimazole  40 mg Oral Daily  . mometasone-formoterol  2 puff Inhalation BID  . multivitamin  1 tablet Oral QHS  . multivitamin with minerals  1 tablet Oral Daily  . pantoprazole (PROTONIX) IV  40 mg Intravenous Q12H  . polyethylene glycol  17 g Oral Daily  . senna-docusate  1 tablet Oral Daily  . sodium chloride  flush  10-40 mL Intracatheter Q12H  . sodium zirconium cyclosilicate  10 g Oral BID  . tiotropium  1 capsule Inhalation Daily   sodium chloride, sodium chloride, sodium chloride, acetaminophen **OR** acetaminophen, alum & mag hydroxide-simeth, guaiFENesin-dextromethorphan, heparin, menthol-cetylpyridinium, ondansetron **OR** ondansetron (ZOFRAN) IV, promethazine, sodium chloride flush  Assessment/ Plan:  Stephen [name] is a 53 y.o. black male withchronic systolic heart failure ejection fraction 10-15%, prior history of cardiac arrest, hyperlipidemia, nephrolithiasis, hypertension, history of TIA, was [facility] to [facility] on[facility]12[facility]2019  for acute exacerbation of systolic congestive heart failure, EF 10%  1. Acute renal failure with hyperkalemia and metabolic acidosis onChronic kidney disease stage III baseline creatinine 1.85, GFR of 47 on 01/24/19. Bland urine.   -2500 cc removed with hemodialysis today.  Total of 5 L has been removed with the last 3 treatments.  Plan to observe for now off hemodialysis.  2. Hypotension with systolic congestive heart failure and volume overload:   - Appreciate cardiology, vascular and cardiology input.   3. Hyponatremia: secondary to hypervolemia and congestive heart failure.   4. Anemia of chronic kidney disease:  Lab Results  Component Value Date   HGB 11.0 (L) 06/22/2019  Monitor  closely     LOS: [place] 6/22/20203:07 PM

## 2019-06-22 NOTE — Progress Notes (Signed)
Stephen [name] NAME: Stephen [name]    MR#:  [id number]  DATE OF BIRTH:  1966/10/15  SUBJECTIVE:  Patient continues to be less  short of breath today requiring 4 Liter Harper  Overall a very poor historian REVIEW OF SYSTEMS:  CONSTITUTIONAL: No fever, positive fatigue and weakness.  EYES: No blurred or double vision.  EARS, NOSE, AND THROAT: No tinnitus or ear pain.  RESPIRATORY: + cough, improving shortness of breath, denies wheezing or hemoptysis.  CARDIOVASCULAR: No chest pain, orthopnea, edema.  GASTROINTESTINAL: Reporting nausea, novomiting, denies diarrhea or abdominal pain.  GENITOURINARY: No dysuria, hematuria.  ENDOCRINE: No polyuria, nocturia,  HEMATOLOGY: No anemia, easy bruising or bleeding SKIN: No rash or lesion. MUSCULOSKELETAL: No joint pain or arthritis.   NEUROLOGIC: No tingling, numbness, weakness.  PSYCHIATRY: No anxiety or depression.   DRUG ALLERGIES:   Allergies  Allergen Reactions  . Lisinopril Cough    VITALS:  Blood pressure 107/81, pulse 91, temperature 97.6 F (36.4 C), temperature source Oral, resp. rate 20, height 6' 1.5" (1.867 m), weight 97.9 kg, SpO2 96 %.  PHYSICAL EXAMINATION:  GENERAL:  53 y.o.-year-old patient lying in the bed with mild acute distress. Chronically ill EYES: Pupils equal, round, reactive to light and accommodation. No scleral icterus. Extraocular muscles intact.  HEENT: Head atraumatic, normocephalic. Oropharynx and nasopharynx dry NECK:  Supple, no jugular venous distention. No thyroid enlargement, no tenderness.  LUNGS: decreased breath sounds bilaterally, no wheezing, positive rales,rhonchi . No use of accessory muscles of respiration.  CARDIOVASCULAR: S1, S2 normal. No murmurs, rubs, or gallops.  ABDOMEN: Soft, nontender, nondistended. Bowel sounds present. EXTREMITIES: No pedal edema, cyanosis, or clubbing.  NEUROLOGIC: Cranial nerves II through XII are intact.  Muscle strength at his baseline in all extremities. Sensation intact. Gait not checked. Generalized weakness PSYCHIATRIC:  patient is alert and oriented x 2.  SKIN: No obvious rash, lesion, or ulcer.    LABORATORY PANEL:   CBC Recent Labs  Lab 06/22/19 1237  WBC 9.8  HGB 11.0*  HCT 33.6*  PLT 218   ------------------------------------------------------------------------------------------------------------------  Chemistries  Recent Labs  Lab 06/21/19 0452 06/22/19 1237  NA  --  130*  K  --  3.2*  CL  --  94*  CO2  --  22  GLUCOSE  --  186*  BUN  --  96*  CREATININE  --  2.23*  CALCIUM  --  8.6*  MG 2.9*  --    ------------------------------------------------------------------------------------------------------------------  Cardiac Enzymes No results for input(s): TROPONINI in the last 168 hours. ------------------------------------------------------------------------------------------------------------------  RADIOLOGY:  Dg Chest Port 1 View  Result Date: 06/21/2019 CLINICAL DATA:  Worsening shortness of breath. EXAM: PORTABLE CHEST 1 VIEW COMPARISON:  06/21/2019 earlier FINDINGS: Left-sided pacemaker unchanged. Lungs are adequately inflated demonstrate stable bilateral patchy airspace process right worse than left likely multifocal pneumonia. Stable cardiomegaly. Remainder of the exam is unchanged. IMPRESSION: Stable bilateral patchy multifocal airspace process right worse than left likely multifocal pneumonia. Stable cardiomegaly. Electronically Signed   By: Marin Olp M.D.   On: 06/21/2019 08:45   Dg Chest Port 1 View  Result Date: 06/21/2019 CLINICAL DATA:  Shortness of breath. EXAM: PORTABLE CHEST 1 VIEW COMPARISON:  06/14/2019 FINDINGS: Single lead left-sided pacemaker remains in place. Prominent cardiomegaly is unchanged from prior exam. Worsening diffuse bilateral airspace opacities since prior exam. Suspect small bilateral pleural effusion. No pneumothorax.  IMPRESSION: Worsening diffuse bilateral airspace opacities, may represent pulmonary edema or  multifocal pneumonia. Probable bilateral pleural effusions. Cardiomegaly is unchanged. Electronically Signed   By: Keith Rake M.D.   On: 06/21/2019 00:58    EKG:   Orders placed or performed during the hospital encounter of 06/12/19  . EKG 12-Lead  . EKG 12-Lead  . ED EKG  . ED EKG  . EKG 12-Lead  . EKG 12-Lead  . EKG 12-Lead  . EKG 12-Lead    ASSESSMENT AND PLAN:  Stephen [name]  is a 53 y.o. male who presents  to the ED with a complaint of shortness of breath and chest discomfort.  He also states he has had some abdominal swelling and recent increase in weight of about 8 pounds  #Acute on chronic kidney disease stage III concerning for pulmonary edema and worsening renal function but hyperkalemia -Patient seen by nephrology encouraged to have hemodialysis.  - hemodialysis. (june20th strated) x3 days removed >5 liters of fluid  # HCAPS Cont cefepime day 6 -worsening right LL infiltrates and elevated Procalcitonin -flutter valve if pt follows instructions  #Intractable nausea and vomiting-could be from GERD--resolved -PPI twice daily -Negative H. pylori stool test -Patient had a upper GI series which was abnormal however GI feels that he is too high risk for any procedure -continue renal diet  #Hyperkalemia Potassium  4.5  # Acute on chronic systolic congestive heart failure with history of cardiomyopathy- nonischemic, ejection fraction around 10 percent with AICD -Seen by cardiology [name].  Appreciate his recommendations.  [name] has discussed with the family members -Daily weights, intake and output -Continue home medication aspirin 81 mg, Plavix, statin -Continue amiodarone 200 mg 2 times a day -Goal is to keep his potassium at 4 and magnesium at 2.0 -patient not a candidate for transfer to [facility] at present.  #COPD chronic no exacerbation Continue  bronchodilators  #Essential hypertension Currently patient is hypotensive hold blood pressure medicines-- Coreg on hold  #Hyperlipidemia continue home dose statin  #DVT prophylaxis with HEPARIN  Dr.singh I  spoke with sister Pedro Earls  on the phone on Sunday and updated. CODE STATUS: fc   TOTAL TIME TAKING CARE OF THIS PATIENT: 36  minutes.   POSSIBLE D/C ??  Note: This dictation was prepared with Dragon dictation along with smaller phrase technology. Any transcriptional errors that result from this process are unintentional.   Fritzi Mandes M.D on 06/22/2019 at 3:23 PM  Between 7am to 6pm - Pager - [phone] After 6pm go to [email or web address] - password [email or web address] [facility]  Office  [phone]  CC: Primary care physician; Valerie Roys, DO

## 2019-06-22 NOTE — Progress Notes (Signed)
PT Cancellation Note  Patient Details Name: Stephen [name] MRN: [id number] DOB: 1966-10-15   Cancelled Treatment:    Reason Eval/Treat Not Completed: Other (comment). 3rd attempt. Pt having another HD session this date. Pt with temp fem cath at this time, OOB mobility restricted. Will cancel current order at this time, please re-order when pt stable and able to be evaluated for OOB mobility.   Airel Magadan 06/22/2019, 9:22 AM  Greggory Stallion, PT, DPT [phone]

## 2019-06-23 ENCOUNTER — Inpatient Hospital Stay: Payer: PRIVATE HEALTH INSURANCE

## 2019-06-23 LAB — CBC
HCT: 35.5 % — ABNORMAL LOW (ref 39.0–52.0)
Hemoglobin: 11.5 g/dL — ABNORMAL LOW (ref 13.0–17.0)
MCH: 27.8 pg (ref 26.0–34.0)
MCHC: 32.4 g/dL (ref 30.0–36.0)
MCV: 86 fL (ref 80.0–100.0)
Platelets: 227 10*3/uL (ref 150–400)
RBC: 4.13 MIL/uL — ABNORMAL LOW (ref 4.22–5.81)
RDW: 17 % — ABNORMAL HIGH (ref 11.5–15.5)
WBC: 10.7 10*3/uL — ABNORMAL HIGH (ref 4.0–10.5)
nRBC: 5.7 % — ABNORMAL HIGH (ref 0.0–0.2)

## 2019-06-23 LAB — RENAL FUNCTION PANEL
Albumin: 2.6 g/dL — ABNORMAL LOW (ref 3.5–5.0)
Anion gap: 12 (ref 5–15)
BUN: 71 mg/dL — ABNORMAL HIGH (ref 6–20)
CO2: 27 mmol/L (ref 22–32)
Calcium: 8.7 mg/dL — ABNORMAL LOW (ref 8.9–10.3)
Chloride: 94 mmol/L — ABNORMAL LOW (ref 98–111)
Creatinine, Ser: 1.83 mg/dL — ABNORMAL HIGH (ref 0.61–1.24)
GFR calc Af Amer: 47 mL/min — ABNORMAL LOW (ref >60)
GFR calc non Af Amer: 41 mL/min — ABNORMAL LOW (ref >60)
Glucose, Bld: 132 mg/dL — ABNORMAL HIGH (ref 70–99)
Phosphorus: 2.6 mg/dL (ref 2.5–4.6)
Potassium: 3.3 mmol/L — ABNORMAL LOW (ref 3.5–5.1)
Sodium: 133 mmol/L — ABNORMAL LOW (ref 135–145)

## 2019-06-23 MED ORDER — POTASSIUM CHLORIDE CRYS ER 20 MEQ PO TBCR
40.0000 meq | EXTENDED_RELEASE_TABLET | Freq: Once | ORAL | Status: AC
  Administered 2019-06-23: 40 meq via ORAL
  Filled 2019-06-23: qty 2

## 2019-06-23 MED ORDER — CEFDINIR 300 MG PO CAPS
300.0000 mg | ORAL_CAPSULE | Freq: Two times a day (BID) | ORAL | Status: DC
  Administered 2019-06-23 – 2019-06-26 (×7): 300 mg via ORAL
  Filled 2019-06-23 (×8): qty 1

## 2019-06-23 MED ORDER — NEPRO/CARBSTEADY PO LIQD: 237.0000 mL | Freq: Two times a day (BID) | ORAL | Status: DC

## 2019-06-23 MED ORDER — ADULT MULTIVITAMIN W/MINERALS CH: 1.0000 | ORAL_TABLET | Freq: Every day | ORAL | Status: DC

## 2019-06-23 MED ORDER — ASPIRIN 81 MG PO CHEW
81.0000 mg | CHEWABLE_TABLET | Freq: Every day | ORAL | Status: DC
  Administered 2019-06-24 – 2019-06-26 (×3): 81 mg via ORAL
  Filled 2019-06-23 (×4): qty 1

## 2019-06-23 MED ORDER — FUROSEMIDE 10 MG/ML IJ SOLN
40.0000 mg | Freq: Once | INTRAMUSCULAR | Status: AC
  Administered 2019-06-23: 40 mg via INTRAVENOUS
  Filled 2019-06-23: qty 4

## 2019-06-23 MED ORDER — MAGNESIUM 400 MG PO TABS: 1.0000 | ORAL_TABLET | Freq: Two times a day (BID) | ORAL | Status: DC

## 2019-06-23 MED ORDER — COLCHICINE 0.6 MG PO TABS: 0.6000 mg | ORAL_TABLET | Freq: Every day | ORAL | Status: DC | PRN

## 2019-06-23 MED ORDER — MAGNESIUM OXIDE 400 (241.3 MG) MG PO TABS
400.0000 mg | ORAL_TABLET | Freq: Two times a day (BID) | ORAL | Status: DC
  Administered 2019-06-23 – 2019-06-26 (×6): 400 mg via ORAL
  Filled 2019-06-23 (×7): qty 1

## 2019-06-23 NOTE — Progress Notes (Signed)
[facility] [place] NAME: Stephen [name]    MR#:  [id number]  DATE OF BIRTH:  [birth year]  SUBJECTIVE:  Patient continues to be less  short of breath today requiring 4-5 Liter Briggs  REVIEW OF SYSTEMS:  CONSTITUTIONAL: No fever, positive fatigue and weakness.  EYES: No blurred or double vision.  EARS, NOSE, AND THROAT: No tinnitus or ear pain.  RESPIRATORY: + cough, improving shortness of breath, denies wheezing or hemoptysis.  CARDIOVASCULAR: No chest pain, orthopnea, edema.  GASTROINTESTINAL: Reporting nausea, novomiting, denies diarrhea or abdominal pain.  GENITOURINARY: No dysuria, hematuria.  ENDOCRINE: No polyuria, nocturia,  HEMATOLOGY: No anemia, easy bruising or bleeding SKIN: No rash or lesion. MUSCULOSKELETAL: No joint pain or arthritis.   NEUROLOGIC: No tingling, numbness, weakness.  PSYCHIATRY: No anxiety or depression.   DRUG ALLERGIES:   Allergies  Allergen Reactions  . Lisinopril Cough    VITALS:  Blood pressure (!) 108/94, pulse 72, temperature 97.8 F (36.6 C), resp. rate 18, height 6' 1.5" (1.867 m), weight 51.2 kg, SpO2 97 %.  PHYSICAL EXAMINATION:  GENERAL:  53 y.o.-year-old patient lying in the bed with mild acute distress. Chronically ill EYES: Pupils equal, round, reactive to light and accommodation. No scleral icterus. Extraocular muscles intact.  HEENT: Head atraumatic, normocephalic. Oropharynx and nasopharynx dry NECK:  Supple, no jugular venous distention. No thyroid enlargement, no tenderness.  LUNGS: decreased breath sounds bilaterally, no wheezing, positive rales,rhonchi . No use of accessory muscles of respiration.  CARDIOVASCULAR: S1, S2 normal. No murmurs, rubs, or gallops.  ABDOMEN: Soft, nontender, nondistended. Bowel sounds present. EXTREMITIES: + pedal edema, cyanosis, or clubbing.  NEUROLOGIC: Cranial nerves II through XII are intact. Muscle strength at his baseline in all extremities.  Sensation intact. Gait not checked. Generalized weakness PSYCHIATRIC:  patient is alert and oriented x 2.  SKIN: No obvious rash, lesion, or ulcer.    LABORATORY PANEL:   CBC Recent Labs  Lab 06/23/19 0107  WBC 10.7*  HGB 11.5*  HCT 35.5*  PLT 227   ------------------------------------------------------------------------------------------------------------------  Chemistries  Recent Labs  Lab 06/21/19 0452  06/23/19 0107  NA  --    < > 133*  K  --    < > 3.3*  CL  --    < > 94*  CO2  --    < > 27  GLUCOSE  --    < > 132*  BUN  --    < > 71*  CREATININE  --    < > 1.83*  CALCIUM  --    < > 8.7*  MG 2.9*  --   --    < > = values in this interval not displayed.   ------------------------------------------------------------------------------------------------------------------  Cardiac Enzymes No results for input(s): TROPONINI in the last 168 hours. ------------------------------------------------------------------------------------------------------------------  RADIOLOGY:  No results found.  EKG:   Orders placed or performed during the hospital encounter of 06/12/19  . EKG 12-Lead  . EKG 12-Lead  . ED EKG  . ED EKG  . EKG 12-Lead  . EKG 12-Lead  . EKG 12-Lead  . EKG 12-Lead    ASSESSMENT AND PLAN:  Stephen [name]  is a 53 y.o. male who presents  to the ED with a complaint of shortness of breath and chest discomfort.  He also states he has had some abdominal swelling and recent increase in weight of about 8 pounds  #Acute on chronic kidney disease stage III concerning for pulmonary edema  and worsening renal function but hyperkalemia -Patient seen by nephrology encouraged to have hemodialysis.  - hemodialysis. (june20th strated) x3 days removed >5 liters of fluid--HD on hold  # HCAPS Cont cefepime--change to oral cefdinir -worsening right LL infiltrates and elevated Procalcitonin -flutter valve if pt follows instructions  #Intractable nausea and  vomiting-could be from GERD--resolved -PPI twice daily -Negative H. pylori stool test -Patient had a upper GI series which was abnormal however GI feels that he is too high risk for any procedure -continue renal diet  #Hyperkalemia Potassium  4.5  # Acute on chronic systolic congestive heart failure with history of cardiomyopathy- nonischemic, ejection fraction around 10 percent with AICD -Seen by cardiology [name].  Appreciate his recommendations.  [name] has discussed with the family members -Daily weights, intake and output -Continue home medication aspirin 81 mg, Plavix, statin -Continue amiodarone 200 mg 2 times a day -Goal is to keep his potassium at 4 and magnesium at 2.0   #COPD chronic no exacerbation Continue bronchodilators  #Essential hypertension Currently patient is hypotensive hold blood pressure medicines-- Coreg on hold  #Hyperlipidemia continue home dose statin  #DVT prophylaxis with HEPARIN  Dr.singh I  spoke with sister Pedro Earls  on the phone on Sunday and updated.  Once decision for dialysis held patient temporary groin HD Access will be removed and will discharge patient to home. Patient is agreement with plan. He is not interested currently to get dialysis or permanent catheter placement.  He holds a very poor prognosis. CODE STATUS: fc   TOTAL TIME TAKING CARE OF THIS PATIENT: 35  minutes.   POSSIBLE D/C ??  Note: This dictation was prepared with Dragon dictation along with smaller phrase technology. Any transcriptional errors that result from this process are unintentional.   Fritzi Mandes M.D on 06/23/2019 at 3:06 PM  Between 7am to 6pm - Pager - [phone] After 6pm go to [email or web address] - password [email or web address] [facility]  Office  [phone]  CC: Primary care physician; Valerie Roys, DO

## 2019-06-23 NOTE — Plan of Care (Signed)
  Problem: Clinical Measurements: Goal: Diagnostic test results will improve Outcome: Progressing Goal: Respiratory complications will improve Outcome: Progressing   Problem: Activity: Goal: Risk for activity intolerance will decrease Outcome: Progressing   Problem: Safety: Goal: Ability to remain free from injury will improve Outcome: Progressing   Problem: Activity: Goal: Capacity to carry out activities will improve Outcome: Progressing

## 2019-06-23 NOTE — Progress Notes (Signed)
[facility]  ROUNDING NOTE   Subjective:   Overall doing fair States that his breathing is not quite back to normal Requiring 3 L of oxygen supplementation by nasal cannula So far 5 L of fluid has been removed with dialysis Appetite is fair.  Able to eat without nausea or vomiting.   Objective:  Vital signs in last 24 hours:  Temp:  [97.7 F (36.5 C)-98.3 F (36.8 C)] 97.8 F (36.6 C) (06/23 0718) Pulse Rate:  [67-72] 72 (06/23 0759) Resp:  [18-20] 18 (06/23 0759) BP: (91-109)/(64-94) 108/94 (06/23 0718) SpO2:  [96 %-100 %] 97 % (06/23 0759) Weight:  [51.2 kg] 51.2 kg (06/23 0548)  Weight change: -1.9 kg Filed Weights   06/22/19 1037 06/22/19 1325 06/23/19 0548  Weight: 97.9 kg 97.2 kg 51.2 kg    Intake/Output: I/O last 3 completed shifts: In: 2002.1 [P.O.:120; IV Piggyback:1882.1] Out: 2990 [Urine:935; Other:2055]   Intake/Output this shift:  Total I/O In: -  Out: 100 [Urine:100]  Physical Exam: General:  No acute distress, laying in the bed  Head: Normocephalic, atraumatic. Moist oral mucosal membranes  Eyes: Anicteric,  Neck: Supple, trachea midline  Lungs:   Mild right base crackles  Heart:  Irregular bradycardic  Abdomen:  Soft, nontender, +distended  Extremities:  Trace peripheral edema.  Neurologic: Nonfocal, moving all four extremities  Skin: No lesions   Right femoral temporary catheter    Basic Metabolic Panel: Recent Labs  Lab 06/17/19 0249  06/19/19 0801 06/20/19 0600 06/20/19 1030 06/21/19 0452 06/22/19 1237 06/23/19 0107  NA 133*  133*   < > 131* 130* 129*  --  130* 133*  K 6.2*  6.2*   < > 4.5 4.7 4.3  --  3.2* 3.3*  CL 97*  97*   < > 94* 93* 91*  --  94* 94*  CO2 21*  21*   < > 20* 21* 22  --  22 27  GLUCOSE 144*  144*   < > 125* 153* 127*  --  186* 132*  BUN 74*  72*   < > 103* 111* 105*  --  96* 71*  CREATININE 4.17*  4.06*   < > 3.38* 3.30* 3.10*  --  2.23* 1.83*  CALCIUM 9.7  9.7   < > 9.3 9.3 9.1  --   8.6* 8.7*  MG  --   --   --   --   --  2.9*  --   --   PHOS 4.8*  --  4.9*  --   --   --  3.3 2.6   < > = values in this interval not displayed.    Liver Function Tests: Recent Labs  Lab 06/17/19 0249 06/19/19 0801 06/22/19 1237 06/23/19 0107  ALBUMIN 3.3* 2.8* 2.5* 2.6*   No results for input(s): LIPASE, AMYLASE in the last 168 hours. No results for input(s): AMMONIA in the last 168 hours.  CBC: Recent Labs  Lab 06/17/19 2107 06/18/19 0415 06/19/19 0801 06/22/19 1237 06/23/19 0107  WBC  --  11.2* 10.0 9.8 10.7*  HGB 12.0* 12.1* 11.6* 11.0* 11.5*  HCT 36.6* 36.9* 35.3* 33.6* 35.5*  MCV  --  86.4 86.7 86.8 86.0  PLT  --  250 227 218 227    Cardiac Enzymes: No results for input(s): CKTOTAL, CKMB, CKMBINDEX, TROPONINI in the last 168 hours.  BNP: Invalid input(s): POCBNP  CBG: Recent Labs  Lab 06/21/19 1152  GLUCAP 148*    Microbiology: Results for  orders placed or performed during the hospital encounter of 06/12/19  SARS Coronavirus 2 (CEPHEID- Performed in [place] lab), Hosp Order     Status: None   Collection Time: 06/12/19  9:52 PM   Specimen: Nasopharyngeal Swab  Result Value Ref Range Status   SARS Coronavirus 2 NEGATIVE NEGATIVE Final    Comment: (NOTE) If result is NEGATIVE SARS-CoV-2 target nucleic acids are NOT DETECTED. The SARS-CoV-2 RNA is generally detectable in upper and lower  respiratory specimens during the acute phase of infection. The lowest  concentration of SARS-CoV-2 viral copies this assay can detect is 250  copies / mL. A negative result does not preclude SARS-CoV-2 infection  and should not be used as the sole basis for treatment or other  patient management decisions.  A negative result may occur with  improper specimen collection / handling, submission of specimen other  than nasopharyngeal swab, presence of viral mutation(s) within the  areas targeted by this assay, and inadequate number of viral copies  (<250  copies / mL). A negative result must be combined with clinical  observations, patient history, and epidemiological information. If result is POSITIVE SARS-CoV-2 target nucleic acids are DETECTED. The SARS-CoV-2 RNA is generally detectable in upper and lower  respiratory specimens dur ing the acute phase of infection.  Positive  results are indicative of active infection with SARS-CoV-2.  Clinical  correlation with patient history and other diagnostic information is  necessary to determine patient infection status.  Positive results do  not rule out bacterial infection or co-infection with other viruses. If result is PRESUMPTIVE POSTIVE SARS-CoV-2 nucleic acids MAY BE PRESENT.   A presumptive positive result was obtained on the submitted specimen  and confirmed on repeat testing.  While 2019 novel coronavirus  (SARS-CoV-2) nucleic acids may be present in the submitted sample  additional confirmatory testing may be necessary for epidemiological  and / or clinical management purposes  to differentiate between  SARS-CoV-2 and other Sarbecovirus currently known to infect humans.  If clinically indicated additional testing with an alternate test  methodology [phone]) is advised. The SARS-CoV-2 RNA is generally  detectable in upper and lower respiratory sp ecimens during the acute  phase of infection. The expected result is Negative. Fact Sheet for Patients:  StrictlyIdeas.no Fact Sheet for Healthcare Providers: BankingDealers.co.za This test is not yet approved or cleared by the [place] FDA and has been authorized for detection and/or diagnosis of SARS-CoV-2 by FDA under an Emergency Use Authorization (EUA).  This EUA will remain in effect (meaning this test can be used) for the duration of the COVID-19 declaration under Section 564(b)(1) of the Act, 21 U.S.C. section 360bbb-3(b)(1), unless the authorization is terminated or revoked  sooner. Performed at [facility], [address] [place], [place] [address]   Expectorated sputum assessment w rflx to resp cult     Status: None   Collection Time: 06/15/19 10:49 PM   Specimen: Expectorated Sputum  Result Value Ref Range Status   Specimen Description EXPECTORATED SPUTUM  Final   Special Requests Normal  Final   Sputum evaluation   Final    THIS SPECIMEN IS ACCEPTABLE FOR SPUTUM CULTURE Performed at [facility], [address], [place], [place] [address]    Report Status 06/16/2019 FINAL  Final  Culture, respiratory     Status: None   Collection Time: 06/15/19 10:49 PM  Result Value Ref Range Status   Specimen Description   Final    EXPECTORATED  SPUTUM Performed at [facility], [address] [place], [place] [address]    Special Requests   Final    Normal Reflexed from [id number] Performed at [facility], [address] [place], [place] [address]    Gram Stain   Final    MODERATE WBC PRESENT, PREDOMINANTLY PMN ABUNDANT GRAM POSITIVE COCCI ABUNDANT GRAM POSITIVE RODS    Culture   Final    ABUNDANT Consistent with normal respiratory flora. Performed at [facility], [place] [address] [place], [place] [address]    Report Status 06/18/2019 FINAL  Final  MRSA PCR Screening     Status: None   Collection Time: 06/17/19 11:20 AM   Specimen: Nasopharyngeal  Result Value Ref Range Status   MRSA by PCR NEGATIVE NEGATIVE Final    Comment:        The GeneXpert MRSA Assay (FDA approved for NASAL specimens only), is one component of a comprehensive MRSA colonization surveillance program. It is not intended to diagnose MRSA infection nor to guide or monitor treatment for MRSA infections. Performed at [facility], [address] [place], [place] [address]     Coagulation Studies: No results for input(s): LABPROT, INR in the last 72 hours.  Urinalysis: No results for input(s): COLORURINE, LABSPEC,  PHURINE, GLUCOSEU, HGBUR, BILIRUBINUR, KETONESUR, PROTEINUR, UROBILINOGEN, NITRITE, LEUKOCYTESUR in the last 72 hours.  Invalid input(s): APPERANCEUR    Imaging: No results found.   Medications:   . sodium chloride 500 mL (06/20/19 1409)  . sodium chloride    . sodium chloride    . ceFEPime (MAXIPIME) IV 2 g (06/22/19 1822)   . amiodarone  200 mg Oral BID  . aspirin EC  81 mg Oral Daily  . atorvastatin  40 mg Oral q1800  . Chlorhexidine Gluconate Cloth  6 each Topical Q0600  . clopidogrel  75 mg Oral Daily  . diclofenac sodium  2 g Topical QID  . feeding supplement (ENSURE ENLIVE)  237 mL Oral BID BM  . heparin injection (subcutaneous)  5,000 Units Subcutaneous Q8H  . ipratropium-albuterol  3 mL Nebulization Q6H  . linaclotide  145 mcg Oral QAC breakfast  . methimazole  40 mg Oral Daily  . mometasone-formoterol  2 puff Inhalation BID  . multivitamin  1 tablet Oral QHS  . multivitamin with minerals  1 tablet Oral Daily  . pantoprazole (PROTONIX) IV  40 mg Intravenous Q12H  . polyethylene glycol  17 g Oral Daily  . senna-docusate  1 tablet Oral Daily  . sodium chloride flush  10-40 mL Intracatheter Q12H  . tiotropium  1 capsule Inhalation Daily   sodium chloride, sodium chloride, sodium chloride, acetaminophen **OR** acetaminophen, alum & mag hydroxide-simeth, guaiFENesin-dextromethorphan, heparin, menthol-cetylpyridinium, ondansetron **OR** ondansetron (ZOFRAN) IV, promethazine, sodium chloride flush  Assessment/ Plan:  [name] is a 53 y.o. black male withchronic systolic heart failure ejection fraction 10-15%, prior history of cardiac arrest, hyperlipidemia, nephrolithiasis, hypertension, history of TIA, was admitted to [facility] on6/12/2019  for acute exacerbation of systolic congestive heart failure, EF 10%  1. Acute renal failure with hyperkalemia and metabolic acidosis onChronic kidney disease stage III baseline creatinine 1.85, GFR of 47 on 01/24/19. Bland  urine.   -2500 cc removed with hemodialysis today.  Total of 5 L has been removed with the last 3 treatments.  Plan to observe for now off hemodialysis. -We will consider removing dialysis catheter on Wednesday -Requiring 3 L oxygen by nasal cannula.  Does not  use oxygen at home  2. Hypotension with systolic congestive heart failure and volume overload:   - Appreciate cardiology, vascular and cardiology input.  -Given 1 dose of IV Lasix this afternoon Repeat chest x-ray  3. Hyponatremia: secondary to hypervolemia and congestive heart failure.   4. Anemia of chronic kidney disease:  Lab Results  Component Value Date   HGB 11.5 (L) 06/23/2019  Monitor closely     LOS: 11 Railyn House 6/23/20202:28 PM

## 2019-06-23 NOTE — Progress Notes (Signed)
Nutrition Follow Up Note RD working remotely.  DOCUMENTATION CODES:   Not applicable  INTERVENTION:   Change to Nepro Shake po BID, each supplement provides 425 kcal and 19 grams protein  Continue daily MVI.  Rena-vite daily  Liberalize diet   NUTRITION DIAGNOSIS:   Inadequate oral intake related to decreased appetite, nausea, vomiting as evidenced by per patient/family report.  GOAL:   Patient will meet greater than or equal to 90% of their needs -progressing   MONITOR:   PO intake, Supplement acceptance, Labs, Weight trends, I & O's  ASSESSMENT:   53 year old male with PMHx of HTN, hx TIA, CHF, s/p placement of AICD, COPD, CKD III, arthritis, gout admitted with intractable nausea and vomiting, acute on chronic systolic CHF.   Pt with improved appetite and oral intake; pt eating 75-100% of meals but refusing most supplements. Nausea/vomiting resolved. RD will change Ensure to Nepro as this comes in different flavors that pt may enjoy better. RD will liberalize diet as pt electrolytes wnl. Per chart, pt down 5 lbs since admit; pt initiated on HD 6/20. Recommend continue supplements and vitamins.   Medications reviewed and include: aspirin, omnicef, plavix, heparin, tapazole, rena-vite, MVI, protonix, miralax, senokot  Labs reviewed: Na 133(L), K 3.3(L), BUN 71(H), creat 1.83(H), P 2.6 wnl Mg 2.9(H)- 6/21 Wbc- 10.7(H), Hgb 11.5(L), Hct 35.5(L)  Diet Order:   Diet Order            Diet 2 gram sodium Room service appropriate? Yes; Fluid consistency: Thin; Fluid restriction: 1200 mL Fluid  Diet effective now             EDUCATION NEEDS:   No education needs have been identified at this time  Skin:  Skin Assessment: Reviewed RN Assessment  Last BM:  6/22  Height:   Ht Readings from Last 1 Encounters:  06/12/19 6' 1.5" (1.867 m)   Weight:   Wt Readings from Last 1 Encounters:  06/23/19 51.2 kg   Ideal Body Weight:  85 kg  BMI:  Body mass index is 14.69  kg/m.  Estimated Nutritional Needs:   Kcal:  0569-7948  Protein:  115-125 grams  Fluid:  2 L/day  Koleen Distance MS, RD, LDN Pager #- [phone] Office#- [phone] After Hours Pager: [phone]

## 2019-06-24 LAB — RENAL FUNCTION PANEL
Albumin: 2.5 g/dL — ABNORMAL LOW (ref 3.5–5.0)
Anion gap: 11 (ref 5–15)
BUN: 56 mg/dL — ABNORMAL HIGH (ref 6–20)
CO2: 25 mmol/L (ref 22–32)
Calcium: 8.8 mg/dL — ABNORMAL LOW (ref 8.9–10.3)
Chloride: 98 mmol/L (ref 98–111)
Creatinine, Ser: 1.65 mg/dL — ABNORMAL HIGH (ref 0.61–1.24)
GFR calc Af Amer: 54 mL/min — ABNORMAL LOW (ref >60)
GFR calc non Af Amer: 46 mL/min — ABNORMAL LOW (ref >60)
Glucose, Bld: 126 mg/dL — ABNORMAL HIGH (ref 70–99)
Phosphorus: 2.2 mg/dL — ABNORMAL LOW (ref 2.5–4.6)
Potassium: 3 mmol/L — ABNORMAL LOW (ref 3.5–5.1)
Sodium: 134 mmol/L — ABNORMAL LOW (ref 135–145)

## 2019-06-24 MED ORDER — SPIRONOLACTONE 25 MG PO TABS
25.0000 mg | ORAL_TABLET | Freq: Every day | ORAL | Status: DC
  Administered 2019-06-24 – 2019-06-26 (×3): 25 mg via ORAL
  Filled 2019-06-24 (×4): qty 1

## 2019-06-24 MED ORDER — IPRATROPIUM-ALBUTEROL 0.5-2.5 (3) MG/3ML IN SOLN
3.0000 mL | Freq: Three times a day (TID) | RESPIRATORY_TRACT | Status: DC
  Administered 2019-06-24 – 2019-06-26 (×6): 3 mL via RESPIRATORY_TRACT
  Filled 2019-06-24 (×6): qty 3

## 2019-06-24 MED ORDER — POTASSIUM CHLORIDE CRYS ER 20 MEQ PO TBCR
40.0000 meq | EXTENDED_RELEASE_TABLET | ORAL | Status: AC
  Administered 2019-06-24 (×2): 40 meq via ORAL
  Filled 2019-06-24 (×2): qty 2

## 2019-06-24 MED ORDER — PANTOPRAZOLE SODIUM 40 MG PO TBEC
40.0000 mg | DELAYED_RELEASE_TABLET | Freq: Two times a day (BID) | ORAL | Status: DC
  Administered 2019-06-24 – 2019-06-25 (×2): 40 mg via ORAL
  Filled 2019-06-24 (×4): qty 1

## 2019-06-24 MED ORDER — FUROSEMIDE 10 MG/ML IJ SOLN
40.0000 mg | Freq: Two times a day (BID) | INTRAMUSCULAR | Status: DC
  Administered 2019-06-24 – 2019-06-25 (×3): 40 mg via INTRAVENOUS
  Filled 2019-06-24 (×3): qty 4

## 2019-06-24 NOTE — Progress Notes (Signed)
[facility]  ROUNDING NOTE   Subjective:   Overall doing fair States that his breathing is not quite back to normal Requiring 3 L of oxygen supplementation by nasal cannula So far 5 L of fluid has been removed with dialysis.  Given IV Lasix yesterday with good response. Appetite is fair.  Able to eat without nausea or vomiting.   Objective:  Vital signs in last 24 hours:  Temp:  [97.8 F (36.6 C)-98.2 F (36.8 C)] 98.2 F (36.8 C) (06/24 0400) Pulse Rate:  [67-73] 73 (06/24 0753) Resp:  [16-19] 19 (06/24 0753) BP: (100-103)/(67-83) 103/81 (06/24 0753) SpO2:  [95 %-100 %] 100 % (06/24 0753) Weight:  [97.7 kg] 97.7 kg (06/24 0400)  Weight change: -0.241 kg Filed Weights   06/22/19 1325 06/23/19 0548 06/24/19 0400  Weight: 97.2 kg 51.2 kg 97.7 kg    Intake/Output: I/O last 3 completed shifts: In: 1882.1 [IV Piggyback:1882.1] Out: 2115 [Urine:2115]   Intake/Output this shift:  Total I/O In: 240 [P.O.:240] Out: 1180 [Urine:1180]  Physical Exam: General:  No acute distress, laying in the bed  Head: Normocephalic, atraumatic. Moist oral mucosal membranes  Eyes: Anicteric,  Neck: Supple, trachea midline  Lungs:   Mild right base crackles  Heart:  Irregular bradycardic  Abdomen:  Soft, nontender, +distended  Extremities:  Trace peripheral edema.  Neurologic: Nonfocal, moving all four extremities  Skin: No lesions   Right femoral temporary catheter    Basic Metabolic Panel: Recent Labs  Lab 06/19/19 0801 06/20/19 0600 06/20/19 1030 06/21/19 0452 06/22/19 1237 06/23/19 0107 06/24/19 0851  NA 131* 130* 129*  --  130* 133* 134*  K 4.5 4.7 4.3  --  3.2* 3.3* 3.0*  CL 94* 93* 91*  --  94* 94* 98  CO2 20* 21* 22  --  22 27 25   GLUCOSE 125* 153* 127*  --  186* 132* 126*  BUN 103* 111* 105*  --  96* 71* 56*  CREATININE 3.38* 3.30* 3.10*  --  2.23* 1.83* 1.65*  CALCIUM 9.3 9.3 9.1  --  8.6* 8.7* 8.8*  MG  --   --   --  2.9*  --   --   --   PHOS 4.9*   --   --   --  3.3 2.6 2.2*    Liver Function Tests: Recent Labs  Lab 06/19/19 0801 06/22/19 1237 06/23/19 0107 06/24/19 0851  ALBUMIN 2.8* 2.5* 2.6* 2.5*   No results for input(s): LIPASE, AMYLASE in the last 168 hours. No results for input(s): AMMONIA in the last 168 hours.  CBC: Recent Labs  Lab 06/17/19 2107 06/18/19 0415 06/19/19 0801 06/22/19 1237 06/23/19 0107  WBC  --  11.2* 10.0 9.8 10.7*  HGB 12.0* 12.1* 11.6* 11.0* 11.5*  HCT 36.6* 36.9* 35.3* 33.6* 35.5*  MCV  --  86.4 86.7 86.8 86.0  PLT  --  250 227 218 227    Cardiac Enzymes: No results for input(s): CKTOTAL, CKMB, CKMBINDEX, TROPONINI in the last 168 hours.  BNP: Invalid input(s): POCBNP  CBG: Recent Labs  Lab 06/21/19 1152  GLUCAP 148*    Microbiology: Results for orders placed or performed during the hospital encounter of 06/12/19  SARS Coronavirus 2 (CEPHEID- Performed in [place] hospital lab), Hosp Order     Status: None   Collection Time: 06/12/19  9:52 PM   Specimen: Nasopharyngeal Swab  Result Value Ref Range Status   SARS Coronavirus 2 NEGATIVE NEGATIVE Final    Comment: (  NOTE) If result is NEGATIVE SARS-CoV-2 target nucleic acids are NOT DETECTED. The SARS-CoV-2 RNA is generally detectable in upper and lower  respiratory specimens during the acute phase of infection. The lowest  concentration of SARS-CoV-2 viral copies this assay can detect is 250  copies / mL. A negative result does not preclude SARS-CoV-2 infection  and should not be used as the sole basis for treatment or other  patient management decisions.  A negative result may occur with  improper specimen collection / handling, submission of specimen other  than nasopharyngeal swab, presence of viral mutation(s) within the  areas targeted by this assay, and inadequate number of viral copies  (<250 copies / mL). A negative result must be combined with clinical  observations, patient history, and epidemiological  information. If result is POSITIVE SARS-CoV-2 target nucleic acids are DETECTED. The SARS-CoV-2 RNA is generally detectable in upper and lower  respiratory specimens dur ing the acute phase of infection.  Positive  results are indicative of active infection with SARS-CoV-2.  Clinical  correlation with patient history and other diagnostic information is  necessary to determine patient infection status.  Positive results do  not rule out bacterial infection or co-infection with other viruses. If result is PRESUMPTIVE POSTIVE SARS-CoV-2 nucleic acids MAY BE PRESENT.   A presumptive positive result was obtained on the submitted specimen  and confirmed on repeat testing.  While 2019 novel coronavirus  (SARS-CoV-2) nucleic acids may be present in the submitted sample  additional confirmatory testing may be necessary for epidemiological  and / or clinical management purposes  to differentiate between  SARS-CoV-2 and other Sarbecovirus currently known to infect humans.  If clinically indicated additional testing with an alternate test  methodology [phone]) is advised. The SARS-CoV-2 RNA is generally  detectable in upper and lower respiratory sp ecimens during the acute  phase of infection. The expected result is Negative. Fact Sheet for Patients:  StrictlyIdeas.no Fact Sheet for Healthcare Providers: BankingDealers.co.za This test is not yet approved or cleared by the [place] FDA and has been authorized for detection and/or diagnosis of SARS-CoV-2 by FDA under an Emergency Use Authorization (EUA).  This EUA will remain in effect (meaning this test can be used) for the duration of the COVID-19 declaration under Section 564(b)(1) of the Act, 21 U.S.C. section 360bbb-3(b)(1), unless the authorization is terminated or revoked sooner. Performed at [facility], [address] [place], [place] [address]   Expectorated sputum  assessment w rflx to resp cult     Status: None   Collection Time: 06/15/19 10:49 PM   Specimen: Expectorated Sputum  Result Value Ref Range Status   Specimen Description EXPECTORATED SPUTUM  Final   Special Requests Normal  Final   Sputum evaluation   Final    THIS SPECIMEN IS ACCEPTABLE FOR SPUTUM CULTURE Performed at [facility], [address] [place], [place] [address]    Report Status 06/16/2019 FINAL  Final  Culture, respiratory     Status: None   Collection Time: 06/15/19 10:49 PM  Result Value Ref Range Status   Specimen Description   Final    EXPECTORATED SPUTUM Performed at [facility], [address] [place], [place] [address]    Special Requests   Final    Normal Reflexed from [id number] Performed at [facility], [address] [place], [place] [address]    Gram Stain   Final    MODERATE WBC PRESENT, PREDOMINANTLY PMN ABUNDANT GRAM POSITIVE COCCI  ABUNDANT GRAM POSITIVE RODS    Culture   Final    ABUNDANT Consistent with normal respiratory flora. Performed at [facility], [place] [address], [place], [place] [address]    Report Status 06/18/2019 FINAL  Final  MRSA PCR Screening     Status: None   Collection Time: 06/17/19 11:20 AM   Specimen: Nasopharyngeal  Result Value Ref Range Status   MRSA by PCR NEGATIVE NEGATIVE Final    Comment:        The GeneXpert MRSA Assay (FDA approved for NASAL specimens only), is one component of a comprehensive MRSA colonization surveillance program. It is not intended to diagnose MRSA infection nor to guide or monitor treatment for MRSA infections. Performed at [facility], [address] [place], [place] [address]     Coagulation Studies: No results for input(s): LABPROT, INR in the last 72 hours.  Urinalysis: No results for input(s): COLORURINE, LABSPEC, PHURINE, GLUCOSEU, HGBUR, BILIRUBINUR, KETONESUR, PROTEINUR, UROBILINOGEN, NITRITE, LEUKOCYTESUR in the last 72  hours.  Invalid input(s): APPERANCEUR    Imaging: Dg Chest Port 1 View  Result Date: 06/23/2019 CLINICAL DATA:  Shortness of breath. EXAM: PORTABLE CHEST 1 VIEW COMPARISON:  06/21/2019. FINDINGS: Cardiac pacer stable position. Stable severe cardiomegaly. Diffuse bilateral pulmonary infiltrates/edema right side greater than left again noted. Similar findings on prior exam. No prominent pleural effusion or pneumothorax. IMPRESSION: 1.  Cardiac pacer stable position.  Severe cardiomegaly again noted. 2. Diffuse bilateral pulmonary infiltrates/edema right side greater than left again noted. Similar findings on prior exam. Electronically Signed   By: [employer] [place]   On: 06/23/2019 15:40     Medications:   . sodium chloride 500 mL (06/20/19 1409)  . sodium chloride    . sodium chloride     . amiodarone  200 mg Oral BID  . aspirin  81 mg Oral Daily  . atorvastatin  40 mg Oral q1800  . cefdinir  300 mg Oral Q12H  . Chlorhexidine Gluconate Cloth  6 each Topical Q0600  . clopidogrel  75 mg Oral Daily  . diclofenac sodium  2 g Topical QID  . feeding supplement (NEPRO CARB STEADY)  237 mL Oral BID BM  . furosemide  40 mg Intravenous BID  . heparin injection (subcutaneous)  5,000 Units Subcutaneous Q8H  . ipratropium-albuterol  3 mL Nebulization Q6H  . linaclotide  145 mcg Oral QAC breakfast  . magnesium oxide  400 mg Oral BID  . methimazole  40 mg Oral Daily  . mometasone-formoterol  2 puff Inhalation BID  . multivitamin  1 tablet Oral QHS  . multivitamin with minerals  1 tablet Oral Daily  . pantoprazole  40 mg Oral BID  . polyethylene glycol  17 g Oral Daily  . potassium chloride  40 mEq Oral Q4H  . senna-docusate  1 tablet Oral Daily  . sodium chloride flush  10-40 mL Intracatheter Q12H  . spironolactone  25 mg Oral Daily  . tiotropium  1 capsule Inhalation Daily   sodium chloride, sodium chloride, sodium chloride, acetaminophen **OR** acetaminophen, alum & mag hydroxide-simeth,  colchicine, guaiFENesin-dextromethorphan, heparin, menthol-cetylpyridinium, ondansetron **OR** ondansetron (ZOFRAN) IV, promethazine, sodium chloride flush  Assessment/ Plan:  [name] is a 53 y.o. black male withchronic systolic heart failure ejection fraction 10-15%, prior history of cardiac arrest, hyperlipidemia, nephrolithiasis, hypertension, history of TIA, was admitted to [facility] on[facility]/12/2019  for acute exacerbation of systolic congestive heart failure, EF 10%  1. Acute renal failure with  hyperkalemia and metabolic acidosis onChronic kidney disease stage III baseline creatinine 1.85, GFR of 47 on 01/24/19. Bland urine.   -Total of 5 L has been removed with the last 3 treatments.  Plan to observe for now off hemodialysis. -We will consider removing dialysis catheter on Thursday -Requiring 3 L oxygen by nasal cannula.  Does not use oxygen at home -Wean oxygen as tolerated -IV furosemide 40 mg twice a day  2. Hypotension with systolic congestive heart failure and volume overload:   - Appreciate cardiology, vascular and cardiology input.   3. Hyponatremia: . secondary to hypervolemia and congestive heart failure.  Improving with diuresis  4. Anemia of chronic kidney disease:  Lab Results  Component Value Date   HGB 11.5 (L) 06/23/2019  Monitor closely  5.  Hypokalemia Started spironolactone    LOS: 12 Hayla Hinger 6/24/20204:08 PM

## 2019-06-24 NOTE — Progress Notes (Signed)
[place] [facility] [place] NAME: Stephen [name]    MR#:  [id number]  DATE OF BIRTH:  [birth year]  SUBJECTIVE:  Patient continues to be less  short of breath today requiring 2 Liter [place]  REVIEW OF SYSTEMS:  CONSTITUTIONAL: No fever, positive fatigue and weakness.  EYES: No blurred or double vision.  EARS, NOSE, AND THROAT: No tinnitus or ear pain.  RESPIRATORY: + cough, improving shortness of breath, denies wheezing or hemoptysis.  CARDIOVASCULAR: No chest pain, orthopnea, edema.  GASTROINTESTINAL: Reporting nausea, novomiting, denies diarrhea or abdominal pain.  GENITOURINARY: No dysuria, hematuria.  ENDOCRINE: No polyuria, nocturia,  HEMATOLOGY: No anemia, easy bruising or bleeding SKIN: No rash or lesion. MUSCULOSKELETAL: No joint pain or arthritis.   NEUROLOGIC: No tingling, numbness, weakness.  PSYCHIATRY: No anxiety or depression.   DRUG ALLERGIES:   Allergies  Allergen Reactions  . Lisinopril Cough    VITALS:  Blood pressure 103/81, pulse 73, temperature 98.2 F (36.8 C), temperature source Oral, resp. rate 19, height 6' 1.5" (1.867 m), weight 97.7 kg, SpO2 100 %.  PHYSICAL EXAMINATION:  GENERAL:  53 y.o.-year-old patient lying in the bed with mild acute distress. Chronically ill EYES: Pupils equal, round, reactive to light and accommodation. No scleral icterus. Extraocular muscles intact.  HEENT: Head atraumatic, normocephalic. Oropharynx and nasopharynx dry NECK:  Supple, no jugular venous distention. No thyroid enlargement, no tenderness.  LUNGS: decreased breath sounds bilaterally, no wheezing, positive rales,rhonchi . No use of accessory muscles of respiration.  CARDIOVASCULAR: S1, S2 normal. No murmurs, rubs, or gallops.  ABDOMEN: Soft, nontender, nondistended. Bowel sounds present. EXTREMITIES: + pedal edema, cyanosis, or clubbing.  NEUROLOGIC: Cranial nerves II through XII are intact. Muscle strength at his baseline  in all extremities. Sensation intact. Gait not checked. Generalized weakness PSYCHIATRIC:  patient is alert and oriented x 2.  SKIN: No obvious rash, lesion, or ulcer.    LABORATORY PANEL:   CBC Recent Labs  Lab 06/23/19 0107  WBC 10.7*  HGB 11.5*  HCT 35.5*  PLT 227   ------------------------------------------------------------------------------------------------------------------  Chemistries  Recent Labs  Lab 06/21/19 0452  06/24/19 0851  NA  --    < > 134*  K  --    < > 3.0*  CL  --    < > 98  CO2  --    < > 25  GLUCOSE  --    < > 126*  BUN  --    < > 56*  CREATININE  --    < > 1.65*  CALCIUM  --    < > 8.8*  MG 2.9*  --   --    < > = values in this interval not displayed.   ------------------------------------------------------------------------------------------------------------------  Cardiac Enzymes No results for input(s): TROPONINI in the last 168 hours. ------------------------------------------------------------------------------------------------------------------  RADIOLOGY:  Dg Chest Port 1 View  Result Date: 06/23/2019 CLINICAL DATA:  Shortness of breath. EXAM: PORTABLE CHEST 1 VIEW COMPARISON:  06/21/2019. FINDINGS: Cardiac pacer stable position. Stable severe cardiomegaly. Diffuse bilateral pulmonary infiltrates/edema right side greater than left again noted. Similar findings on prior exam. No prominent pleural effusion or pneumothorax. IMPRESSION: 1.  Cardiac pacer stable position.  Severe cardiomegaly again noted. 2. Diffuse bilateral pulmonary infiltrates/edema right side greater than left again noted. Similar findings on prior exam. Electronically Signed   By: [place]   On: 06/23/2019 15:40    EKG:   Orders placed or performed during the hospital encounter  of 06/12/19  . EKG 12-Lead  . EKG 12-Lead  . ED EKG  . ED EKG  . EKG 12-Lead  . EKG 12-Lead  . EKG 12-Lead  . EKG 12-Lead    ASSESSMENT AND PLAN:  Stephen [name]  is a 53  y.o. male who presents  to the ED with a complaint of shortness of breath and chest discomfort.  He also states he has had some abdominal swelling and recent increase in weight of about 8 pounds  #Acute on chronic kidney disease stage III concerning for pulmonary edema and worsening renal function but hyperkalemia -Patient seen by nephrology encouraged to have hemodialysis.  - hemodialysis. (june20th strated) x3 days removed >5 liters of fluid--HD on hold -diuresis in daily with Lasix well. -Creatinine improving down to 1.6  # HCAPS Cont cefepime--change to oral cefdinir (10 days--ends on 6/28) -worsening right LL infiltrates and elevated Procalcitonin -flutter valve if pt follows instructions  #Hyperkalemia Potassium  4.5  # Acute on chronic systolic congestive heart failure with history of cardiomyopathy nonischemic, ejection fraction around 10 percent with AICD -Seen by cardiology [name].  Appreciate his recommendations.  [name] has discussed with the family members -Daily weights, intake and output -Continue home medication aspirin 81 mg, Plavix, statin -Continue amiodarone 200 mg 2 times a day -Goal is to keep his potassium at 4 and magnesium at 2.0  #COPD chronic no exacerbation Continue bronchodilators  #Essential hypertension Currently patient is hypotensive hold blood pressure medicines-- Coreg on hold  #Hyperlipidemia continue home dose statin  #DVT prophylaxis with [place] spoke with sister carmen Aggie Hacker  on the phone  and updated.  He holds a very poor prognosis. He is at a high risk for readmission for recurrent and same problems.  CODE STATUS: fc   TOTAL TIME TAKING CARE OF THIS PATIENT: 35  minutes.   POSSIBLE D/C 1-2 days  Note: This dictation was prepared with Dragon dictation along with smaller phrase technology. Any transcriptional errors that result from this process are unintentional.   Fritzi Mandes M.D on 06/24/2019 at 2:26  PM  Between 7am to 6pm - Pager - [phone] After 6pm go to [email or web address] - password [email or web address] [place] [facility]  Office  [phone]  CC: Primary care physician; Valerie Roys, DO

## 2019-06-25 LAB — BASIC METABOLIC PANEL
Anion gap: 12 (ref 5–15)
BUN: 47 mg/dL — ABNORMAL HIGH (ref 6–20)
CO2: 24 mmol/L (ref 22–32)
Calcium: 8.8 mg/dL — ABNORMAL LOW (ref 8.9–10.3)
Chloride: 100 mmol/L (ref 98–111)
Creatinine, Ser: 1.46 mg/dL — ABNORMAL HIGH (ref 0.61–1.24)
GFR calc Af Amer: >60 mL/min (ref >60)
GFR calc non Af Amer: 54 mL/min — ABNORMAL LOW (ref >60)
Glucose, Bld: 125 mg/dL — ABNORMAL HIGH (ref 70–99)
Potassium: 3.7 mmol/L (ref 3.5–5.1)
Sodium: 136 mmol/L (ref 135–145)

## 2019-06-25 LAB — GLUCOSE, CAPILLARY: Glucose-Capillary: 115 mg/dL — ABNORMAL HIGH (ref 70–99)

## 2019-06-25 MED ORDER — TORSEMIDE 20 MG PO TABS
40.0000 mg | ORAL_TABLET | Freq: Every day | ORAL | Status: DC
  Administered 2019-06-25 – 2019-06-26 (×2): 40 mg via ORAL
  Filled 2019-06-25 (×3): qty 2

## 2019-06-25 NOTE — Progress Notes (Signed)
[facility]  ROUNDING NOTE   Subjective:   Overall doing fair States that his breathing is not quite back to normal Requiring 2 L of oxygen supplementation by nasal cannula So far 5 L of fluid has been removed with dialysis.    Voiding well with IV Lasix.  2500 cc urine output yesterday 06/24 0701 - 06/25 0700 In: 578 [P.O.:578] Out: 2555 [Urine:2555]    Objective:  Vital signs in last 24 hours:  Temp:  [97.9 F (36.6 C)-98.5 F (36.9 C)] 98 F (36.7 C) (06/25 0807) Pulse Rate:  [76-77] 76 (06/25 0807) Resp:  [20] 20 (06/25 0807) BP: (92-114)/(63-91) 114/91 (06/25 0807) SpO2:  [92 %-99 %] 99 % (06/25 0807) Weight:  [96.7 kg] 96.7 kg (06/25 0451)  Weight change: -0.998 kg Filed Weights   06/23/19 0548 06/24/19 0400 06/25/19 0451  Weight: 51.2 kg 97.7 kg 96.7 kg    Intake/Output: I/O last 3 completed shifts: In: 578 [P.O.:578] Out: 2458 [Urine:3355]   Intake/Output this shift:  No intake/output data recorded.  Physical Exam: General:  No acute distress, laying in the bed  Head: Normocephalic, atraumatic. Moist oral mucosal membranes  Eyes: Anicteric,  Neck: Supple, trachea midline  Lungs:   Mild right base crackles  Heart:  Irregular bradycardic  Abdomen:  Soft, nontender, +distended  Extremities:  Trace peripheral edema.  Neurologic: Nonfocal, moving all four extremities  Skin: No lesions   Right femoral temporary catheter    Basic Metabolic Panel: Recent Labs  Lab 06/19/19 0801  06/20/19 1030 06/21/19 0452 06/22/19 1237 06/23/19 0107 06/24/19 0851 06/25/19 0459  NA 131*   < > 129*  --  130* 133* 134* 136  K 4.5   < > 4.3  --  3.2* 3.3* 3.0* 3.7  CL 94*   < > 91*  --  94* 94* 98 100  CO2 20*   < > 22  --  22 27 25 24   GLUCOSE 125*   < > 127*  --  186* 132* 126* 125*  BUN 103*   < > 105*  --  96* 71* 56* 47*  CREATININE 3.38*   < > 3.10*  --  2.23* 1.83* 1.65* 1.46*  CALCIUM 9.3   < > 9.1  --  8.6* 8.7* 8.8* 8.8*  MG  --   --   --   2.9*  --   --   --   --   PHOS 4.9*  --   --   --  3.3 2.6 2.2*  --    < > = values in this interval not displayed.    Liver Function Tests: Recent Labs  Lab 06/19/19 0801 06/22/19 1237 06/23/19 0107 06/24/19 0851  ALBUMIN 2.8* 2.5* 2.6* 2.5*   No results for input(s): LIPASE, AMYLASE in the last 168 hours. No results for input(s): AMMONIA in the last 168 hours.  CBC: Recent Labs  Lab 06/19/19 0801 06/22/19 1237 06/23/19 0107  WBC 10.0 9.8 10.7*  HGB 11.6* 11.0* 11.5*  HCT 35.3* 33.6* 35.5*  MCV 86.7 86.8 86.0  PLT 227 218 227    Cardiac Enzymes: No results for input(s): CKTOTAL, CKMB, CKMBINDEX, TROPONINI in the last 168 hours.  BNP: Invalid input(s): POCBNP  CBG: Recent Labs  Lab 06/21/19 1152  GLUCAP 148*    Microbiology: Results for orders placed or performed during the hospital encounter of 06/12/19  SARS Coronavirus 2 (CEPHEID- Performed in [place] hospital lab), Hosp Order     Status:  None   Collection Time: 06/12/19  9:52 PM   Specimen: Nasopharyngeal Swab  Result Value Ref Range Status   SARS Coronavirus 2 NEGATIVE NEGATIVE Final    Comment: (NOTE) If result is NEGATIVE SARS-CoV-2 target nucleic acids are NOT DETECTED. The SARS-CoV-2 RNA is generally detectable in upper and lower  respiratory specimens during the acute phase of infection. The lowest  concentration of SARS-CoV-2 viral copies this assay can detect is 250  copies / mL. A negative result does not preclude SARS-CoV-2 infection  and should not be used as the sole basis for treatment or other  patient management decisions.  A negative result may occur with  improper specimen collection / handling, submission of specimen other  than nasopharyngeal swab, presence of viral mutation(s) within the  areas targeted by this assay, and inadequate number of viral copies  (<250 copies / mL). A negative result must be combined with clinical  observations, patient history, and epidemiological  information. If result is POSITIVE SARS-CoV-2 target nucleic acids are DETECTED. The SARS-CoV-2 RNA is generally detectable in upper and lower  respiratory specimens dur ing the acute phase of infection.  Positive  results are indicative of active infection with SARS-CoV-2.  Clinical  correlation with patient history and other diagnostic information is  necessary to determine patient infection status.  Positive results do  not rule out bacterial infection or co-infection with other viruses. If result is PRESUMPTIVE POSTIVE SARS-CoV-2 nucleic acids MAY BE PRESENT.   A presumptive positive result was obtained on the submitted specimen  and confirmed on repeat testing.  While 2019 novel coronavirus  (SARS-CoV-2) nucleic acids may be present in the submitted sample  additional confirmatory testing may be necessary for epidemiological  and / or clinical management purposes  to differentiate between  SARS-CoV-2 and other Sarbecovirus currently known to infect humans.  If clinically indicated additional testing with an alternate test  methodology [phone]) is advised. The SARS-CoV-2 RNA is generally  detectable in upper and lower respiratory sp ecimens during the acute  phase of infection. The expected result is Negative. Fact Sheet for Patients:  StrictlyIdeas.no Fact Sheet for Healthcare Providers: BankingDealers.co.za This test is not yet approved or cleared by the [place] FDA and has been authorized for detection and/or diagnosis of SARS-CoV-2 by FDA under an Emergency Use Authorization (EUA).  This EUA will remain in effect (meaning this test can be used) for the duration of the COVID-19 declaration under Section 564(b)(1) of the Act, 21 U.S.C. section 360bbb-3(b)(1), unless the authorization is terminated or revoked sooner. Performed at [facility], [address] [place], [place] [address]   Expectorated sputum  assessment w rflx to resp cult     Status: None   Collection Time: 06/15/19 10:49 PM   Specimen: Expectorated Sputum  Result Value Ref Range Status   Specimen Description EXPECTORATED SPUTUM  Final   Special Requests Normal  Final   Sputum evaluation   Final    THIS SPECIMEN IS ACCEPTABLE FOR SPUTUM CULTURE Performed at [facility], [address], [place], [place] [address]    Report Status 06/16/2019 FINAL  Final  Culture, respiratory     Status: None   Collection Time: 06/15/19 10:49 PM  Result Value Ref Range Status   Specimen Description   Final    EXPECTORATED SPUTUM Performed at [facility], [address], [place], [place] [address]    Special Requests   Final    Normal Reflexed from [id number]  Performed at [facility], [address] [place], [place] [address]    Gram Stain   Final    MODERATE WBC PRESENT, PREDOMINANTLY PMN ABUNDANT GRAM POSITIVE COCCI ABUNDANT GRAM POSITIVE RODS    Culture   Final    ABUNDANT Consistent with normal respiratory flora. Performed at [facility], [address], [place], [place] [address]    Report Status 06/18/2019 FINAL  Final  MRSA PCR Screening     Status: None   Collection Time: 06/17/19 11:20 AM   Specimen: Nasopharyngeal  Result Value Ref Range Status   MRSA by PCR NEGATIVE NEGATIVE Final    Comment:        The GeneXpert MRSA Assay (FDA approved for NASAL specimens only), is one component of a comprehensive MRSA colonization surveillance program. It is not intended to diagnose MRSA infection nor to guide or monitor treatment for MRSA infections. Performed at [facility], [address] [place],  [address]     Coagulation Studies: No results for input(s): LABPROT, INR in the last 72 hours.  Urinalysis: No results for input(s): COLORURINE, LABSPEC, PHURINE, GLUCOSEU, HGBUR, BILIRUBINUR, KETONESUR, PROTEINUR, UROBILINOGEN, NITRITE, LEUKOCYTESUR in the last 72  hours.  Invalid input(s): APPERANCEUR    Imaging: Dg Chest Port 1 View  Result Date: 06/23/2019 CLINICAL DATA:  Shortness of breath. EXAM: PORTABLE CHEST 1 VIEW COMPARISON:  06/21/2019. FINDINGS: Cardiac pacer stable position. Stable severe cardiomegaly. Diffuse bilateral pulmonary infiltrates/edema right side greater than left again noted. Similar findings on prior exam. No prominent pleural effusion or pneumothorax. IMPRESSION: 1.  Cardiac pacer stable position.  Severe cardiomegaly again noted. 2. Diffuse bilateral pulmonary infiltrates/edema right side greater than left again noted. Similar findings on prior exam. Electronically Signed   By: [place]   On: 06/23/2019 15:40     Medications:   . sodium chloride 500 mL (06/20/19 1409)  . sodium chloride    . sodium chloride     . amiodarone  200 mg Oral BID  . aspirin  81 mg Oral Daily  . atorvastatin  40 mg Oral q1800  . cefdinir  300 mg Oral Q12H  . Chlorhexidine Gluconate Cloth  6 each Topical Q0600  . clopidogrel  75 mg Oral Daily  . diclofenac sodium  2 g Topical QID  . feeding supplement (NEPRO CARB STEADY)  237 mL Oral BID BM  . heparin injection (subcutaneous)  5,000 Units Subcutaneous Q8H  . ipratropium-albuterol  3 mL Nebulization TID  . linaclotide  145 mcg Oral QAC breakfast  . magnesium oxide  400 mg Oral BID  . methimazole  40 mg Oral Daily  . mometasone-formoterol  2 puff Inhalation BID  . multivitamin  1 tablet Oral QHS  . pantoprazole  40 mg Oral BID  . polyethylene glycol  17 g Oral Daily  . senna-docusate  1 tablet Oral Daily  . sodium chloride flush  10-40 mL Intracatheter Q12H  . spironolactone  25 mg Oral Daily  . tiotropium  1 capsule Inhalation Daily  . torsemide  40 mg Oral Daily   sodium chloride, sodium chloride, sodium chloride, acetaminophen **OR** acetaminophen, alum & mag hydroxide-simeth, colchicine, guaiFENesin-dextromethorphan, heparin, menthol-cetylpyridinium, ondansetron **OR**  ondansetron (ZOFRAN) IV, promethazine, sodium chloride flush  Assessment/ Plan:  Mr. DAKARAI MCGLOCKLIN is a 53 y.o. black male withchronic systolic heart failure ejection fraction 10-15%, prior history of cardiac arrest, hyperlipidemia, nephrolithiasis, hypertension, history of TIA, was admitted to [facility] on6/12/2019  for acute exacerbation  of systolic congestive heart failure, EF 10%  1. Acute renal failure with hyperkalemia and metabolic acidosis onChronic kidney disease stage III baseline creatinine 1.85, GFR of 47 on 01/24/19. [place] urine.   -Total of 5 L has been removed with the last 3 treatments.  Plan to observe for now off hemodialysis. -Requiring 2 L oxygen by nasal cannula.  Does not use oxygen at home -Wean oxygen as tolerated -Changed to oral torsemide -Dialysis catheter may be removed  2. Hypotension with systolic congestive heart failure and volume overload:   - Appreciate cardiology, vascular and cardiology input.   3. Hyponatremia: . secondary to hypervolemia and congestive heart failure.  Improving with diuresis  4. Anemia of chronic kidney disease:  Lab Results  Component Value Date   HGB 11.5 (L) 06/23/2019  Monitor closely  5.  Hypokalemia Improving with spironolactone    LOS: 13 Shaquitta Burbridge 6/25/20209:55 AM

## 2019-06-25 NOTE — Progress Notes (Signed)
[place] [facility] [place] NAME: Stephen [name]    MR#:  [id number]  DATE OF BIRTH:  Jul 06, 1966  SUBJECTIVE:  Doing so much better  REVIEW OF SYSTEMS:  CONSTITUTIONAL: No fever, positive fatigue and weakness.  EYES: No blurred or double vision.  EARS, NOSE, AND THROAT: No tinnitus or ear pain.  RESPIRATORY: + cough, improving shortness of breath, denies wheezing or hemoptysis.  CARDIOVASCULAR: No chest pain, orthopnea, edema.  GASTROINTESTINAL: Reporting nausea, novomiting, denies diarrhea or abdominal pain.  GENITOURINARY: No dysuria, hematuria.  ENDOCRINE: No polyuria, nocturia,  HEMATOLOGY: No anemia, easy bruising or bleeding SKIN: No rash or lesion. MUSCULOSKELETAL: No joint pain or arthritis.   NEUROLOGIC: No tingling, numbness, weakness.  PSYCHIATRY: No anxiety or depression.   DRUG ALLERGIES:   Allergies  Allergen Reactions  . Lisinopril Cough    VITALS:  Blood pressure (!) 114/91, pulse 76, temperature 98 F (36.7 C), temperature source Oral, resp. rate 20, height 6' 1.5" (1.867 m), weight 96.7 kg, SpO2 99 %.  PHYSICAL EXAMINATION:  GENERAL:  53 y.o.-year-old patient lying in the bed with mild acute distress. Chronically ill EYES: Pupils equal, round, reactive to light and accommodation. No scleral icterus. Extraocular muscles intact.  HEENT: Head atraumatic, normocephalic. Oropharynx and nasopharynx dry NECK:  Supple, no jugular venous distention. No thyroid enlargement, no tenderness.  LUNGS: decreased breath sounds bilaterally, no wheezing, positive rales,rhonchi . No use of accessory muscles of respiration.  CARDIOVASCULAR: S1, S2 normal. No murmurs, rubs, or gallops.  ABDOMEN: Soft, nontender, nondistended. Bowel sounds present. EXTREMITIES: + pedal edema, cyanosis, or clubbing.  NEUROLOGIC: Cranial nerves II through XII are intact. Muscle strength at his baseline in all extremities. Sensation intact. Gait not  checked. Generalized weakness PSYCHIATRIC:  patient is alert and oriented x 2.  SKIN: No obvious rash, lesion, or ulcer.    LABORATORY PANEL:   CBC Recent Labs  Lab 06/23/19 0107  WBC 10.7*  HGB 11.5*  HCT 35.5*  PLT 227   ------------------------------------------------------------------------------------------------------------------  Chemistries  Recent Labs  Lab 06/21/19 0452  06/25/19 0459  NA  --    < > 136  K  --    < > 3.7  CL  --    < > 100  CO2  --    < > 24  GLUCOSE  --    < > 125*  BUN  --    < > 47*  CREATININE  --    < > 1.46*  CALCIUM  --    < > 8.8*  MG 2.9*  --   --    < > = values in this interval not displayed.   ------------------------------------------------------------------------------------------------------------------  Cardiac Enzymes No results for input(s): TROPONINI in the last 168 hours. ------------------------------------------------------------------------------------------------------------------  RADIOLOGY:  Dg Chest Port 1 View  Result Date: 06/23/2019 CLINICAL DATA:  Shortness of breath. EXAM: PORTABLE CHEST 1 VIEW COMPARISON:  06/21/2019. FINDINGS: Cardiac pacer stable position. Stable severe cardiomegaly. Diffuse bilateral pulmonary infiltrates/edema right side greater than left again noted. Similar findings on prior exam. No prominent pleural effusion or pneumothorax. IMPRESSION: 1.  Cardiac pacer stable position.  Severe cardiomegaly again noted. 2. Diffuse bilateral pulmonary infiltrates/edema right side greater than left again noted. Similar findings on prior exam. Electronically Signed   By: [place]   On: 06/23/2019 15:40    EKG:   Orders placed or performed during the hospital encounter of 06/12/19  . EKG 12-Lead  . EKG  12-Lead  . ED EKG  . ED EKG  . EKG 12-Lead  . EKG 12-Lead  . EKG 12-Lead  . EKG 12-Lead    ASSESSMENT AND PLAN:  Stephen [name]  is a 53 y.o. male who presents  to the ED with a  complaint of shortness of breath and chest discomfort.  He also states he has had some abdominal swelling and recent increase in weight of about 8 pounds  #Acute on chronic kidney disease stage III concerning for pulmonary edema and worsening renal function but hyperkalemia -Patient seen by nephrology encouraged to have hemodialysis.  - hemodialysis. (june20th strated) x3 days removed >5 liters of fluid--HD on hold--temp cath to be removed today -diuresis in daily with Lasix  -Creatinine improving down to 1.6  # HCAPS Cont cefepime--change to oral cefdinir (10 days--ends on 6/28) -worsening right LL infiltrates and elevated Procalcitonin -flutter valve if pt follows instructions  #Hyperkalemia Potassium  4.5  # Acute on chronic systolic congestive heart failure with history of cardiomyopathy nonischemic, ejection fraction around 10 percent with AICD -Seen by cardiology [name].  Appreciate his recommendations.  [name] has discussed with the family members -Daily weights, intake and output -Continue home medication aspirin 81 mg, Plavix, statin -Continue amiodarone 200 mg 2 times a day -Goal is to keep his potassium at 4 and magnesium at 2.0  #COPD chronic no exacerbation Continue bronchodilators  #Essential hypertension Currently patient is hypotensive hold blood pressure medicines-- Coreg on hold  #Hyperlipidemia continue home dose statin  #DVT prophylaxis with [place] spoke with sister carmen Aggie Hacker  on the phone  and updated. Pt will discharge in am if remains stable--pt agreeable  He holds a very poor prognosis. He is at a high risk for readmission for recurrent and same problems.  CODE STATUS: fc   TOTAL TIME TAKING CARE OF THIS PATIENT: 25  minutes.   POSSIBLE D/C 1-2 days  Note: This dictation was prepared with Dragon dictation along with smaller phrase technology. Any transcriptional errors that result from this process are  unintentional.   Fritzi Mandes M.D on 06/25/2019 at 11:01 AM  Between 7am to 6pm - Pager - [phone] After 6pm go to [email or web address] - password [email or web address] [facility]  Office  [phone]  CC: Primary care physician; Valerie Roys, DO

## 2019-06-25 NOTE — Plan of Care (Signed)

## 2019-06-25 NOTE — Plan of Care (Signed)
  Problem: Education: Goal: Knowledge of General Education information will improve Description: Including pain rating scale, medication(s)/side effects and non-pharmacologic comfort measures Outcome: Progressing   Problem: Clinical Measurements: Goal: Respiratory complications will improve Outcome: Progressing   Problem: Activity: Goal: Risk for activity intolerance will decrease Outcome: Progressing   Problem: Elimination: Goal: Will not experience complications related to urinary retention Outcome: Progressing   Problem: Skin Integrity: Goal: Risk for impaired skin integrity will decrease Outcome: Progressing

## 2019-06-25 NOTE — Progress Notes (Signed)
Update provided to pt's sister, [name].

## 2019-06-26 LAB — BASIC METABOLIC PANEL
Anion gap: 13 (ref 5–15)
BUN: 40 mg/dL — ABNORMAL HIGH (ref 6–20)
CO2: 26 mmol/L (ref 22–32)
Calcium: 8.7 mg/dL — ABNORMAL LOW (ref 8.9–10.3)
Chloride: 98 mmol/L (ref 98–111)
Creatinine, Ser: 1.47 mg/dL — ABNORMAL HIGH (ref 0.61–1.24)
GFR calc Af Amer: >60 mL/min (ref >60)
GFR calc non Af Amer: 53 mL/min — ABNORMAL LOW (ref >60)
Glucose, Bld: 105 mg/dL — ABNORMAL HIGH (ref 70–99)
Potassium: 3.5 mmol/L (ref 3.5–5.1)
Sodium: 137 mmol/L (ref 135–145)

## 2019-06-26 MED ORDER — NEPRO/CARBSTEADY PO LIQD: 237.0000 mL | Freq: Two times a day (BID) | ORAL | 0 refills | Status: DC

## 2019-06-26 MED ORDER — ALLOPURINOL 100 MG PO TABS
100.0000 mg | ORAL_TABLET | Freq: Two times a day (BID) | ORAL | Status: DC
  Administered 2019-06-26: 100 mg via ORAL
  Filled 2019-06-26 (×2): qty 1

## 2019-06-26 MED ORDER — POTASSIUM CHLORIDE CRYS ER 20 MEQ PO TBCR: 20.0000 meq | EXTENDED_RELEASE_TABLET | Freq: Two times a day (BID) | ORAL | 1 refills | Status: DC

## 2019-06-26 MED ORDER — POTASSIUM CHLORIDE CRYS ER 20 MEQ PO TBCR
20.0000 meq | EXTENDED_RELEASE_TABLET | Freq: Two times a day (BID) | ORAL | Status: DC
  Administered 2019-06-26: 20 meq via ORAL
  Filled 2019-06-26: qty 1

## 2019-06-26 MED ORDER — TORSEMIDE 20 MG PO TABS: 20.0000 mg | ORAL_TABLET | Freq: Two times a day (BID) | ORAL | 1 refills | Status: DC

## 2019-06-26 NOTE — Progress Notes (Signed)
Patient education completed, no questions regarding medications, follow-up appointments, and treatment plan. AVS reviewed and given to patient.

## 2019-06-26 NOTE — Progress Notes (Signed)
Stephen [name] walked in hallway on room air, O2 Sat stayed from 93-100 during walk. He did exhibit some shortness of breath that resolved by taking breaks and walking slower. Dr. Reather Laurence notified.

## 2019-06-26 NOTE — TOC Transition Note (Addendum)
Transition of Care [facility]) - CM/SW Discharge Note   Patient Details  Name: Stephen [name] MRN: [id number] Date of Birth: [birth year]  [facility]) CM/SW Contact:  Elza Rafter, RN Phone Number: 06/26/2019, 8:39 AM   Clinical Narrative:   Patient is well known to [facility].  He is independent from home alone.  Admitted with CHF exacerbation.  Current with Meagan Johnson-PCP.  Obtains medications at [facility] without difficulty.  He is active with [name] at the [facility].  Kristi with [facility] is available to him as well.  Referral made to [facility] for home health services.  He has private insurance which most home health agencies do not contract with.  He is unable to pay privately for home health services.  His girl friend will be transporting him home.  No issues with transportation or housing.  He has a functioning scale and weighs daily.     Addendum-Unable to find a home health agency that can take his insurance.  [facility] will not take as he has insurance.  Dr. Posey Pronto and patient aware.    Final next level of care: Home/Self Care Barriers to Discharge: No Barriers Identified   Patient Goals and CMS Choice        Discharge Placement                       Discharge Plan and Services                          HH Arranged: Patient Refused [facility]          Social Determinants of Health (SDOH) Interventions     Readmission Risk Interventions Readmission Risk Prevention Plan 06/26/2019 06/15/2019 05/04/2019  Transportation Screening Complete Complete Complete  Medication Review Press photographer) Complete Complete -  PCP or Specialist appointment within 3-5 days of discharge Complete - -  [place] or [place] Complete Patient refused -  SW Recovery Care/Counseling Consult Patient refused Patient refused -  Palliative Care Screening Not Applicable Patient Refused -  [place] Not Applicable Not Applicable -  Some  recent data might be hidden

## 2019-06-26 NOTE — Plan of Care (Signed)
Mr. Ranganathan has watched the heart failure education videos and can explain that he needs to watch his fluid intake/output using a measured cup at home to drink 64oz or less a day, daily weights to include when to call the doctor or take a fluid pill. He has no questions for nursing or physician team at this time. Patient ready for discharge.

## 2019-06-26 NOTE — Progress Notes (Signed)
[facility]  ROUNDING NOTE   Subjective:   Overall doing fair States that his breathing is much better overall Off of oxygen supplementation Net negative by 11 L. Standing weight 95.5 kg 06/25 0701 - 06/26 0700 In: 250 [P.O.:240; I.V.:10] Out: 2000 [Urine:2000]    Objective:  Vital signs in last 24 hours:  Temp:  [97.9 F (36.6 C)-98.9 F (37.2 C)] 97.9 F (36.6 C) (06/26 0800) Pulse Rate:  [75-77] 75 (06/26 0800) Resp:  [8-20] 20 (06/26 0800) BP: (98-105)/(72-81) 105/81 (06/26 0800) SpO2:  [94 %-100 %] 94 % (06/26 1251) Weight:  [95.5 kg] 95.5 kg (06/26 0335)  Weight change: -1.179 kg Filed Weights   06/24/19 0400 06/25/19 0451 06/26/19 0335  Weight: 97.7 kg 96.7 kg 95.5 kg    Intake/Output: I/O last 3 completed shifts: In: 588 [P.O.:578; I.V.:10] Out: 3375 [Urine:3375]   Intake/Output this shift:  Total I/O In: 120 [P.O.:120] Out: -   Physical Exam: General:  No acute distress, laying in the bed  Head: Normocephalic, atraumatic. Moist oral mucosal membranes  Eyes: Anicteric,  Neck: Supple, trachea midline  Lungs:   Clear to auscultation bilaterally, room air  Heart:  Irregular bradycardic  Abdomen:  Soft, nontender, +distended  Extremities:  Trace peripheral edema.  Neurologic: Nonfocal, moving all four extremities  Skin: No lesions       Basic Metabolic Panel: Recent Labs  Lab 06/21/19 0452 06/22/19 1237 06/23/19 0107 06/24/19 0851 06/25/19 0459 06/26/19 0244  NA  --  130* 133* 134* 136 137  K  --  3.2* 3.3* 3.0* 3.7 3.5  CL  --  94* 94* 98 100 98  CO2  --  22 27 25 24 26   GLUCOSE  --  186* 132* 126* 125* 105*  BUN  --  96* 71* 56* 47* 40*  CREATININE  --  2.23* 1.83* 1.65* 1.46* 1.47*  CALCIUM  --  8.6* 8.7* 8.8* 8.8* 8.7*  MG 2.9*  --   --   --   --   --   PHOS  --  3.3 2.6 2.2*  --   --     Liver Function Tests: Recent Labs  Lab 06/22/19 1237 06/23/19 0107 06/24/19 0851  ALBUMIN 2.5* 2.6* 2.5*   No results for  input(s): LIPASE, AMYLASE in the last 168 hours. No results for input(s): AMMONIA in the last 168 hours.  CBC: Recent Labs  Lab 06/22/19 1237 06/23/19 0107  WBC 9.8 10.7*  HGB 11.0* 11.5*  HCT 33.6* 35.5*  MCV 86.8 86.0  PLT 218 227    Cardiac Enzymes: No results for input(s): CKTOTAL, CKMB, CKMBINDEX, TROPONINI in the last 168 hours.  BNP: Invalid input(s): POCBNP  CBG: Recent Labs  Lab 06/21/19 1152 06/25/19 2125  GLUCAP 148* 115*    Microbiology: Results for orders placed or performed during the hospital encounter of 06/12/19  SARS Coronavirus 2 (CEPHEID- Performed in [place] hospital lab), Hosp Order     Status: None   Collection Time: 06/12/19  9:52 PM   Specimen: Nasopharyngeal Swab  Result Value Ref Range Status   SARS Coronavirus 2 NEGATIVE NEGATIVE Final    Comment: (NOTE) If result is NEGATIVE SARS-CoV-2 target nucleic acids are NOT DETECTED. The SARS-CoV-2 RNA is generally detectable in upper and lower  respiratory specimens during the acute phase of infection. The lowest  concentration of SARS-CoV-2 viral copies this assay can detect is 250  copies / mL. A negative result does not preclude SARS-CoV-2 infection  and should not be used as the sole basis for treatment or other  patient management decisions.  A negative result may occur with  improper specimen collection / handling, submission of specimen other  than nasopharyngeal swab, presence of viral mutation(s) within the  areas targeted by this assay, and inadequate number of viral copies  (<250 copies / mL). A negative result must be combined with clinical  observations, patient history, and epidemiological information. If result is POSITIVE SARS-CoV-2 target nucleic acids are DETECTED. The SARS-CoV-2 RNA is generally detectable in upper and lower  respiratory specimens dur ing the acute phase of infection.  Positive  results are indicative of active infection with SARS-CoV-2.  Clinical   correlation with patient history and other diagnostic information is  necessary to determine patient infection status.  Positive results do  not rule out bacterial infection or co-infection with other viruses. If result is PRESUMPTIVE POSTIVE SARS-CoV-2 nucleic acids MAY BE PRESENT.   A presumptive positive result was obtained on the submitted specimen  and confirmed on repeat testing.  While 2019 novel coronavirus  (SARS-CoV-2) nucleic acids may be present in the submitted sample  additional confirmatory testing may be necessary for epidemiological  and / or clinical management purposes  to differentiate between  SARS-CoV-2 and other Sarbecovirus currently known to infect humans.  If clinically indicated additional testing with an alternate test  methodology [phone]) is advised. The SARS-CoV-2 RNA is generally  detectable in upper and lower respiratory sp ecimens during the acute  phase of infection. The expected result is Negative. Fact Sheet for Patients:  StrictlyIdeas.no Fact Sheet for Healthcare Providers: BankingDealers.co.za This test is not yet approved or cleared by the [place] FDA and has been authorized for detection and/or diagnosis of SARS-CoV-2 by FDA under an Emergency Use Authorization (EUA).  This EUA will remain in effect (meaning this test can be used) for the duration of the COVID-19 declaration under Section 564(b)(1) of the Act, 21 U.S.C. section 360bbb-3(b)(1), unless the authorization is terminated or revoked sooner. Performed at [facility], [address] [place], [place] [address]   Expectorated sputum assessment w rflx to resp cult     Status: None   Collection Time: 06/15/19 10:49 PM   Specimen: Expectorated Sputum  Result Value Ref Range Status   Specimen Description EXPECTORATED SPUTUM  Final   Special Requests Normal  Final   Sputum evaluation   Final    THIS SPECIMEN IS ACCEPTABLE  FOR SPUTUM CULTURE Performed at [facility], [address], [place], [place] [address]    Report Status 06/16/2019 FINAL  Final  Culture, respiratory     Status: None   Collection Time: 06/15/19 10:49 PM  Result Value Ref Range Status   Specimen Description   Final    EXPECTORATED SPUTUM Performed at [facility], [address], [place], [place] [address]    Special Requests   Final    Normal Reflexed from [id number] Performed at [facility], [address] [place], [place] [address]    Gram Stain   Final    MODERATE WBC PRESENT, PREDOMINANTLY PMN ABUNDANT GRAM POSITIVE COCCI ABUNDANT GRAM POSITIVE RODS    Culture   Final    ABUNDANT Consistent with normal respiratory flora. Performed at [facility], [place] [address] [place], [place] [address]    Report Status 06/18/2019 FINAL  Final  MRSA PCR Screening     Status: None   Collection Time: 06/17/19 11:20  AM   Specimen: Nasopharyngeal  Result Value Ref Range Status   MRSA by PCR NEGATIVE NEGATIVE Final    Comment:        The GeneXpert MRSA Assay (FDA approved for NASAL specimens only), is one component of a comprehensive MRSA colonization surveillance program. It is not intended to diagnose MRSA infection nor to guide or monitor treatment for MRSA infections. Performed at [facility], [address] [place], [place] [address]     Coagulation Studies: No results for input(s): LABPROT, INR in the last 72 hours.  Urinalysis: No results for input(s): COLORURINE, LABSPEC, PHURINE, GLUCOSEU, HGBUR, BILIRUBINUR, KETONESUR, PROTEINUR, UROBILINOGEN, NITRITE, LEUKOCYTESUR in the last 72 hours.  Invalid input(s): APPERANCEUR    Imaging: No results found.   Medications:   . sodium chloride 500 mL (06/20/19 1409)  . sodium chloride    . sodium chloride     . allopurinol  100 mg Oral BID  . amiodarone  200 mg Oral BID  . aspirin  81 mg Oral Daily  . atorvastatin  40 mg  Oral q1800  . cefdinir  300 mg Oral Q12H  . Chlorhexidine Gluconate Cloth  6 each Topical Q0600  . clopidogrel  75 mg Oral Daily  . diclofenac sodium  2 g Topical QID  . feeding supplement (NEPRO CARB STEADY)  237 mL Oral BID BM  . heparin injection (subcutaneous)  5,000 Units Subcutaneous Q8H  . ipratropium-albuterol  3 mL Nebulization TID  . linaclotide  145 mcg Oral QAC breakfast  . magnesium oxide  400 mg Oral BID  . methimazole  40 mg Oral Daily  . mometasone-formoterol  2 puff Inhalation BID  . multivitamin  1 tablet Oral QHS  . pantoprazole  40 mg Oral BID  . polyethylene glycol  17 g Oral Daily  . potassium chloride  20 mEq Oral BID  . senna-docusate  1 tablet Oral Daily  . sodium chloride flush  10-40 mL Intracatheter Q12H  . spironolactone  25 mg Oral Daily  . tiotropium  1 capsule Inhalation Daily  . torsemide  40 mg Oral Daily   sodium chloride, sodium chloride, sodium chloride, acetaminophen **OR** acetaminophen, alum & mag hydroxide-simeth, colchicine, guaiFENesin-dextromethorphan, heparin, menthol-cetylpyridinium, ondansetron **OR** ondansetron (ZOFRAN) IV, promethazine, sodium chloride flush  Assessment/ Plan:  Stephen [name] is a 53 y.o. black male withchronic systolic heart failure ejection fraction 10-15%, prior history of cardiac arrest, hyperlipidemia, nephrolithiasis, hypertension, history of TIA, was admitted to [facility] on6/12/2019  for acute exacerbation of systolic congestive heart failure, EF 10%  1. Acute renal failure with hyperkalemia and metabolic acidosis onChronic kidney disease stage III baseline creatinine 1.85, GFR of 47 on 01/24/19. Bland urine.   -Net negative by a total of 11 L so far.  Standing weight 95.5 kg.  Now he is off of oxygen and is doing well on room air.  Transition to oral diuretics which he is tolerating well.  Plan to follow-up as outpatient.  2. Hypotension with systolic congestive heart failure and volume overload:   -  Appreciate cardiology, vascular and cardiology input.   3. Hyponatremia: . secondary to hypervolemia and congestive heart failure.  Improving with diuresis  4. Anemia of chronic kidney disease:  Lab Results  Component Value Date   HGB 11.5 (L) 06/23/2019  Monitor closely  5.  Hypokalemia Improving with spironolactone Lab Results  Component Value Date   K 3.5 06/26/2019       LOS: 14 Stephen [name] 6/26/20202:51  PM

## 2019-06-26 NOTE — Discharge Summary (Signed)
[place] [facility] [place] NAME: Stephen [name]    MR#:  [id number]  DATE OF BIRTH:  [birth year]  DATE OF ADMISSION:  06/12/2019 ADMITTING PHYSICIAN: Lance Coon, MD  DATE OF DISCHARGE: 06/26/2019  PRIMARY CARE PHYSICIAN: Valerie Roys, DO    ADMISSION DIAGNOSIS:  Shortness of breath [R06.02] Acute pulmonary edema (HCC) [J81.0] Failure of outpatient treatment [Z78.9] Acute on chronic congestive heart failure, unspecified heart failure type ([place]) [I50.9]  DISCHARGE DIAGNOSIS:  *Acute on chronic systolic congestive heart failure with history of cardiomyopathy nonischemic, ejection fraction around 10 percent with AICD  *Acute renal failure with hyperkalemia and metabolic acidosis onChronic kidney disease stage III   SECONDARY DIAGNOSIS:   Past Medical History:  Diagnosis Date  . AICD (automatic cardioverter/defibrillator) present   . AKI (acute kidney injury) ([place]) 12/24/2017  . Arrhythmia   . Arthritis    "lower back, knees" (06/10/2018)  . Cardiac arrest ([place]) 12/23/2017   Brief V-fib arrest  . Chest pain 09/08/2017  . CHF (congestive heart failure) (HCC)    nonischemic cardiomyopathy, EF 25%  . Gout    "on daily RX" (06/10/2018)  . Headache    "q couple months" (06/10/2018)  . High cholesterol   . History of kidney stones   . Hypertension   . Influenza A 12/24/2017  . NICM (nonischemic cardiomyopathy) ([place])   . Pneumonia 12/20/2017  . TIA (transient ischemic attack) 06/21/2016   "still affects my memory a little bit" (06/10/2018)    HOSPITAL COURSE:   TimothyWatkinsis a53 y.o.malewho presents  to the ED with a complaint of shortness of breath and chest discomfort. He also states he has had some abdominal swelling and recent increase in weight of about 8 pounds  #Acute on chronic kidney disease stage III concerning for pulmonary edema and worsening renal function  -Patient seen by nephrology encouraged to have  hemodialysis--had 3 cycles of HD - hemodialysis. (june20th strated) x3 days removed >5 liters of fluid--HD on hold--temp cath  removed -now on po Torsemide  -Creatinine improving down to 1.6  # HCAPS Cont cefepime--change to oral cefdinir -completed 8 days -CXR has showed worsening right LL infiltrates and elevated Procalcitonin -flutter valve if pt follows instructions  #Hyperkalemia Potassium  3.5  # Acute on chronic systolic congestive heart failure with history of severe cardiomyopathy nonischemic, ejection fraction around 10 percent with AICD -Seen by cardiology [name].  Appreciate his recommendations.  [name] has discussed with the family members -Daily weights, intake and output -Continue home medication aspirin 81 mg, Plavix, statin -Continue amiodarone 200 mg 2 times a day -Goal is to keep his potassium at 4 and magnesium at 2.0 -allergy to ACE inhibitors -already on po amiodarone and BP low --defer BB or hydralazine as out pt per cardiology or PCP  #COPD chronic no exacerbation Continue bronchodilators  #Essential hypertension Resumed spironolactone bp low for starting antihypwertensives  #Hyperlipidemia continue home dose statin  #DVT prophylaxis with HEPARIN  D/c home today Assess for home oxygen use  Dr.singh spoke with sister carmen Aggie Hacker  on the phone  and updated. Pt will discharge today--pt agreeable CONSULTS OBTAINED:  Treatment Team:  Lavonia Dana, MD  DRUG ALLERGIES:   Allergies  Allergen Reactions  . Lisinopril Cough    DISCHARGE MEDICATIONS:   Allergies as of 06/26/2019      Reactions   Lisinopril Cough      Medication List    STOP taking these medications  chlorpheniramine-HYDROcodone 10-8 MG/5ML Suer Commonly known as: Tussionex Pennkinetic ER   metolazone 2.5 MG tablet Commonly known as: ZAROXOLYN   promethazine 12.5 MG tablet Commonly known as: PHENERGAN     TAKE these medications    acetaminophen 325 MG tablet Commonly known as: TYLENOL Take 2 tablets (650 mg total) by mouth every 6 (six) hours as needed. What changed:   how much to take  reasons to take this   albuterol 108 (90 Base) MCG/ACT inhaler Commonly known as: VENTOLIN HFA Inhale 2 puffs into the lungs every 4 (four) hours as needed for wheezing or shortness of breath.   allopurinol 100 MG tablet Commonly known as: ZYLOPRIM Take 2 tablets (200 mg total) by mouth daily. What changed:   how much to take  when to take this   amiodarone 200 MG tablet Commonly known as: PACERONE Take 200 mg by mouth 2 (two) times daily.   aspirin 81 MG chewable tablet Chew 81 mg by mouth daily.   atorvastatin 40 MG tablet Commonly known as: LIPITOR Take 1 tablet (40 mg total) by mouth daily at 6 PM.   carvedilol 3.125 MG tablet Commonly known as: COREG Take 3.125 mg by mouth 2 (two) times daily with a meal.   clopidogrel 75 MG tablet Commonly known as: PLAVIX TAKE ONE TABLET BY MOUTH EVERY DAY   colchicine 0.6 MG tablet Take 1 tablet (0.6 mg total) by mouth daily as needed (gout flare).   cyclobenzaprine 10 MG tablet Commonly known as: FLEXERIL Take 1 tablet (10 mg total) by mouth at bedtime. What changed:   when to take this  reasons to take this   diclofenac sodium 1 % Gel Commonly known as: VOLTAREN Apply 2 g topically 4 (four) times daily as needed (pain).   feeding supplement (NEPRO CARB STEADY) Liqd Take 237 mLs by mouth 2 (two) times daily between meals.   fluticasone 50 MCG/ACT nasal spray Commonly known as: FLONASE Place 2 sprays into both nostrils daily. What changed:   when to take this  reasons to take this   fluticasone-salmeterol 115-21 MCG/ACT inhaler Commonly known as: Advair HFA Inhale 2 puffs into the lungs 2 (two) times daily.   Magnesium 400 MG Tabs Take 1 tablet by mouth 2 (two) times daily.   methimazole 10 MG tablet Commonly known as: TAPAZOLE Take 4  tablets (40 mg total) by mouth daily.   multivitamin with minerals Tabs tablet Take 1 tablet by mouth daily.   nitroGLYCERIN 0.4 MG SL tablet Commonly known as: NITROSTAT Place 1 tablet (0.4 mg total) under the tongue every 5 (five) minutes as needed for chest pain.   ondansetron 4 MG disintegrating tablet Commonly known as: Zofran ODT Take 1 tablet (4 mg total) by mouth every 8 (eight) hours as needed for nausea or vomiting.   pantoprazole 40 MG tablet Commonly known as: PROTONIX Take 1 tablet (40 mg total) by mouth 2 (two) times daily.   polyethylene glycol 17 g packet Commonly known as: MIRALAX / GLYCOLAX Take 17 g by mouth daily.   potassium chloride SA 20 MEQ tablet Commonly known as: K-DUR Take 1 tablet (20 mEq total) by mouth 2 (two) times daily. What changed: when to take this   sennosides-docusate sodium 8.6-50 MG tablet Commonly known as: SENOKOT-S Take 1 tablet by mouth daily.   sodium chloride 0.65 % Soln nasal spray Commonly known as: OCEAN Place 1 spray into both nostrils as needed for congestion.   spironolactone 25 MG tablet  Commonly known as: ALDACTONE Take 0.5 tablets (12.5 mg total) by mouth daily.   Tiotropium Bromide Monohydrate 2.5 MCG/ACT Aers Commonly known as: Spiriva Respimat Inhale 1 puff into the lungs daily.   torsemide 20 MG tablet Commonly known as: DEMADEX Take 1 tablet (20 mg total) by mouth 2 (two) times daily.       If you experience worsening of your admission symptoms, develop shortness of breath, life threatening emergency, suicidal or homicidal thoughts you must seek medical attention immediately by calling 911 or calling your MD immediately  if symptoms less severe.  You Must read complete instructions/literature along with all the possible adverse reactions/side effects for all the Medicines you take and that have been prescribed to you. Take any new Medicines after you have completely understood and accept all the possible  adverse reactions/side effects.   Please note  You were cared for by a hospitalist during your hospital stay. If you have any questions about your discharge medications or the care you received while you were in the hospital after you are discharged, you can call the unit and asked to speak with the hospitalist on call if the hospitalist that took care of you is not available. Once you are discharged, your primary care physician will handle any further medical issues. Please note that NO REFILLS for any discharge medications will be authorized once you are discharged, as it is imperative that you return to your primary care physician (or establish a relationship with a primary care physician if you do not have one) for your aftercare needs so that they can reassess your need for medications and monitor your lab values. Today   SUBJECTIVE   Doing well  VITAL SIGNS:  Blood pressure 105/81, pulse 75, temperature 97.9 F (36.6 C), temperature source Oral, resp. rate 20, height 6' 1.5" (1.867 m), weight 95.5 kg, SpO2 100 %.  I/O:    Intake/Output Summary (Last 24 hours) at 06/26/2019 0814 Last data filed at 06/26/2019 0148 Gross per 24 hour  Intake 250 ml  Output 2000 ml  Net -1750 ml    PHYSICAL EXAMINATION:   GENERAL:  53 y.o.-year-old patient lying in the bed with mild acute distress. Chronically ill EYES: Pupils equal, round, reactive to light and accommodation. No scleral icterus. Extraocular muscles intact.  HEENT: Head atraumatic, normocephalic. Oropharynx and nasopharynx dry NECK:  Supple, no jugular venous distention. No thyroid enlargement, no tenderness.  LUNGS: decreased breath sounds bilaterally, no wheezing, positive rales,rhonchi . No use of accessory muscles of respiration.  CARDIOVASCULAR: S1, S2 normal. No murmurs, rubs, or gallops.  ABDOMEN: Soft, nontender, nondistended. Bowel sounds present. EXTREMITIES: + pedal edema No  cyanosis, or clubbing.  NEUROLOGIC: Cranial  nerves II through XII are intact. Muscle strength at his baseline in all extremities. Sensation intact. Gait not checked. Generalized weakness PSYCHIATRIC:  patient is alert and oriented x 2.  SKIN: No obvious rash, lesion, or ulcer.  DATA REVIEW:   CBC  Recent Labs  Lab 06/23/19 0107  WBC 10.7*  HGB 11.5*  HCT 35.5*  PLT 227    Chemistries  Recent Labs  Lab 06/21/19 0452  06/26/19 0244  NA  --    < > 137  K  --    < > 3.5  CL  --    < > 98  CO2  --    < > 26  GLUCOSE  --    < > 105*  BUN  --    < >  40*  CREATININE  --    < > 1.47*  CALCIUM  --    < > 8.7*  MG 2.9*  --   --    < > = values in this interval not displayed.    Microbiology Results   Recent Results (from the past 240 hour(s))  MRSA PCR Screening     Status: None   Collection Time: 06/17/19 11:20 AM   Specimen: Nasopharyngeal  Result Value Ref Range Status   MRSA by PCR NEGATIVE NEGATIVE Final    Comment:        The GeneXpert MRSA Assay (FDA approved for NASAL specimens only), is one component of a comprehensive MRSA colonization surveillance program. It is not intended to diagnose MRSA infection nor to guide or monitor treatment for MRSA infections. Performed at [facility], [address], [place], [place] [address]     RADIOLOGY:  No results found.   CODE STATUS:     Code Status Orders  (From admission, onward)         Start     Ordered   06/12/19 2327  Full code  Continuous     06/12/19 2326        Code Status History    Date Active Date Inactive Code Status Order ID Comments User Context   05/02/2019 1909 05/04/2019 1757 Full Code [id number]  Otila Back, MD ED   04/19/2019 0019 04/19/2019 1732 Full Code [id number]  Lance Coon, MD Inpatient   04/07/2019 1426 04/15/2019 2146 Full Code [id number]  Hillary Bow, MD ED   02/18/2019 0021 03/02/2019 0025 Full Code [id number]  [name], MD ED   02/13/2019 1451 02/14/2019 2116 Full Code [id number]  Hillary Bow, MD ED    12/27/2018 0600 12/28/2018 1829 Partial Code [id number]  Harrie Foreman, MD Inpatient   12/04/2018 0437 12/10/2018 2037 Partial Code [id number]  Bradly Bienenstock, NP Inpatient   12/04/2018 0147 12/04/2018 0437 Full Code [id number]  Henreitta Leber, MD ED   08/19/2018 2248 08/20/2018 2258 Full Code [id number]  Amelia Jo, MD Inpatient   06/30/2018 1757 07/06/2018 1647 Full Code [id number]  [name], MD Inpatient   06/10/2018 1606 06/11/2018 1537 Full Code [id number]  Thompson Grayer, MD Inpatient   12/23/2017 1153 12/31/2017 1729 Full Code [id number]  Erlene Quan, PA-C Inpatient   12/20/2017 1851 12/23/2017 1110 Full Code [id number]  [name], MD Inpatient   09/08/2017 0644 09/10/2017 2127 Full Code [id number]  Harrie Foreman, MD Inpatient   07/29/2017 1044 07/31/2017 2152 Full Code [id number]  Henreitta Leber, MD Inpatient   06/19/2017 0027 06/20/2017 2114 Full Code [id number]  Lance Coon, MD ED   05/20/2017 1226 05/21/2017 1759 Full Code [id number]  [name], MD Inpatient   06/22/2016 0430 06/22/2016 2150 Full Code [id number]  Quintella Baton, MD Inpatient   06/04/2016 1843 06/06/2016 1815 Full Code [id number]  [name], MD Inpatient   Advance Care Planning Activity      TOTAL TIME TAKING CARE OF THIS PATIENT: *40* minutes.    Fritzi Mandes M.D on 06/26/2019 at 8:14 AM  Between 7am to 6pm - Pager - [phone] After 6pm go to [email or web address] - password [email or web address] [facility]  Office  [phone]  CC: Primary care physician; Valerie Roys, DO

## 2019-06-26 NOTE — Progress Notes (Signed)
All belongings at bedside per patient.

## 2019-06-29 ENCOUNTER — Telehealth: Payer: Self-pay

## 2019-06-29 ENCOUNTER — Telehealth (HOSPITAL_COMMUNITY): Payer: Self-pay

## 2019-06-29 NOTE — Telephone Encounter (Signed)
Spoke with [name] today to see how he was doing since discharge.  He states his ankles and legs are really swollen.  He states took 2 torsemide and extra potassium last night.  He has no appts scheduled and missed his Advanced Heart Failure clinic appt at [facility].  Rescheduled his apt. for July 17, 2019.  He refuses to go to [facility] to HF clinic, I will advise  Heart failure clinic to see if she wants to set him up an appt.  Stephen [name] states did dialysis in hospital but none schedule now, he states has all his medications.  He states weak staying with his girlfriend at the time for her to help him.  Will continue to visit Stephen [name] for heart failure.   [place] [phone]

## 2019-06-29 NOTE — Telephone Encounter (Signed)
HF clinic has opening for 8:40 tomorrow, Stephen [name] refused appt due to too early.  He states he wants to go to his PCP.  He will make appt for them, he wants me to contact nephrology to schedule appt for next week.  Will call them.   [place] [phone]

## 2019-06-29 NOTE — Telephone Encounter (Signed)
I have made the 1st attempt to contact the patient or family member in charge, in order to follow up from recently being discharged from the hospital. I left a message on voicemail but I will make another attempt at a different time.  

## 2019-06-30 ENCOUNTER — Ambulatory Visit: Payer: PRIVATE HEALTH INSURANCE | Admitting: Family

## 2019-07-01 ENCOUNTER — Encounter: Payer: Self-pay | Admitting: Family Medicine

## 2019-07-01 ENCOUNTER — Other Ambulatory Visit: Payer: Self-pay

## 2019-07-01 ENCOUNTER — Inpatient Hospital Stay
Admission: EM | Admit: 2019-07-01 | Discharge: 2019-07-08 | DRG: 291 | Disposition: A | Discharge status: Other Acute Inpatient Hospital | Payer: PRIVATE HEALTH INSURANCE | Attending: Pulmonary Disease | Admitting: Pulmonary Disease

## 2019-07-01 ENCOUNTER — Ambulatory Visit (INDEPENDENT_AMBULATORY_CARE_PROVIDER_SITE_OTHER): Payer: PRIVATE HEALTH INSURANCE | Admitting: Family Medicine

## 2019-07-01 ENCOUNTER — Emergency Department: Payer: PRIVATE HEALTH INSURANCE

## 2019-07-01 VITALS — BP 94/66 | HR 79 | Temp 98.5°F | Ht 73.0 in

## 2019-07-01 DIAGNOSIS — M109 Gout, unspecified: Secondary | ICD-10-CM | POA: Diagnosis present

## 2019-07-01 DIAGNOSIS — Z9852 Vasectomy status: Secondary | ICD-10-CM

## 2019-07-01 DIAGNOSIS — J449 Chronic obstructive pulmonary disease, unspecified: Secondary | ICD-10-CM | POA: Diagnosis present

## 2019-07-01 DIAGNOSIS — Z8249 Family history of ischemic heart disease and other diseases of the circulatory system: Secondary | ICD-10-CM

## 2019-07-01 DIAGNOSIS — R0602 Shortness of breath: Secondary | ICD-10-CM

## 2019-07-01 DIAGNOSIS — Z8673 Personal history of transient ischemic attack (TIA), and cerebral infarction without residual deficits: Secondary | ICD-10-CM

## 2019-07-01 DIAGNOSIS — M25562 Pain in left knee: Secondary | ICD-10-CM | POA: Diagnosis not present

## 2019-07-01 DIAGNOSIS — R0601 Orthopnea: Secondary | ICD-10-CM | POA: Diagnosis not present

## 2019-07-01 DIAGNOSIS — E7800 Pure hypercholesterolemia, unspecified: Secondary | ICD-10-CM | POA: Diagnosis present

## 2019-07-01 DIAGNOSIS — Y838 Other surgical procedures as the cause of abnormal reaction of the patient, or of later complication, without mention of misadventure at the time of the procedure: Secondary | ICD-10-CM | POA: Diagnosis not present

## 2019-07-01 DIAGNOSIS — I959 Hypotension, unspecified: Secondary | ICD-10-CM | POA: Diagnosis present

## 2019-07-01 DIAGNOSIS — I10 Essential (primary) hypertension: Secondary | ICD-10-CM | POA: Diagnosis not present

## 2019-07-01 DIAGNOSIS — M170 Bilateral primary osteoarthritis of knee: Secondary | ICD-10-CM | POA: Diagnosis present

## 2019-07-01 DIAGNOSIS — R609 Edema, unspecified: Secondary | ICD-10-CM

## 2019-07-01 DIAGNOSIS — Z95810 Presence of automatic (implantable) cardiac defibrillator: Secondary | ICD-10-CM

## 2019-07-01 DIAGNOSIS — E872 Acidosis: Secondary | ICD-10-CM | POA: Diagnosis present

## 2019-07-01 DIAGNOSIS — Z7982 Long term (current) use of aspirin: Secondary | ICD-10-CM

## 2019-07-01 DIAGNOSIS — E785 Hyperlipidemia, unspecified: Secondary | ICD-10-CM | POA: Diagnosis present

## 2019-07-01 DIAGNOSIS — M25462 Effusion, left knee: Secondary | ICD-10-CM | POA: Diagnosis present

## 2019-07-01 DIAGNOSIS — E875 Hyperkalemia: Secondary | ICD-10-CM | POA: Diagnosis present

## 2019-07-01 DIAGNOSIS — I340 Nonrheumatic mitral (valve) insufficiency: Secondary | ICD-10-CM | POA: Diagnosis present

## 2019-07-01 DIAGNOSIS — R34 Anuria and oliguria: Secondary | ICD-10-CM | POA: Diagnosis not present

## 2019-07-01 DIAGNOSIS — I130 Hypertensive heart and chronic kidney disease with heart failure and stage 1 through stage 4 chronic kidney disease, or unspecified chronic kidney disease: Secondary | ICD-10-CM | POA: Diagnosis not present

## 2019-07-01 DIAGNOSIS — Z7902 Long term (current) use of antithrombotics/antiplatelets: Secondary | ICD-10-CM

## 2019-07-01 DIAGNOSIS — G5793 Unspecified mononeuropathy of bilateral lower limbs: Secondary | ICD-10-CM | POA: Diagnosis present

## 2019-07-01 DIAGNOSIS — Z888 Allergy status to other drugs, medicaments and biological substances status: Secondary | ICD-10-CM

## 2019-07-01 DIAGNOSIS — Z7951 Long term (current) use of inhaled steroids: Secondary | ICD-10-CM

## 2019-07-01 DIAGNOSIS — G8929 Other chronic pain: Secondary | ICD-10-CM | POA: Diagnosis not present

## 2019-07-01 DIAGNOSIS — Z87891 Personal history of nicotine dependence: Secondary | ICD-10-CM

## 2019-07-01 DIAGNOSIS — I5023 Acute on chronic systolic (congestive) heart failure: Secondary | ICD-10-CM | POA: Diagnosis not present

## 2019-07-01 DIAGNOSIS — Z8674 Personal history of sudden cardiac arrest: Secondary | ICD-10-CM

## 2019-07-01 DIAGNOSIS — Z7189 Other specified counseling: Secondary | ICD-10-CM

## 2019-07-01 DIAGNOSIS — J9601 Acute respiratory failure with hypoxia: Secondary | ICD-10-CM | POA: Diagnosis present

## 2019-07-01 DIAGNOSIS — T8249XA Other complication of vascular dialysis catheter, initial encounter: Secondary | ICD-10-CM | POA: Diagnosis not present

## 2019-07-01 DIAGNOSIS — Z79899 Other long term (current) drug therapy: Secondary | ICD-10-CM

## 2019-07-01 DIAGNOSIS — I255 Ischemic cardiomyopathy: Secondary | ICD-10-CM | POA: Diagnosis present

## 2019-07-01 DIAGNOSIS — Z515 Encounter for palliative care: Secondary | ICD-10-CM

## 2019-07-01 DIAGNOSIS — Z20828 Contact with and (suspected) exposure to other viral communicable diseases: Secondary | ICD-10-CM | POA: Diagnosis present

## 2019-07-01 DIAGNOSIS — N179 Acute kidney failure, unspecified: Secondary | ICD-10-CM | POA: Diagnosis not present

## 2019-07-01 DIAGNOSIS — I447 Left bundle-branch block, unspecified: Secondary | ICD-10-CM | POA: Diagnosis present

## 2019-07-01 DIAGNOSIS — Z87442 Personal history of urinary calculi: Secondary | ICD-10-CM

## 2019-07-01 DIAGNOSIS — N183 Chronic kidney disease, stage 3 (moderate): Secondary | ICD-10-CM | POA: Diagnosis present

## 2019-07-01 DIAGNOSIS — I509 Heart failure, unspecified: Secondary | ICD-10-CM

## 2019-07-01 LAB — CBC
HCT: 37 % — ABNORMAL LOW (ref 39.0–52.0)
Hemoglobin: 12 g/dL — ABNORMAL LOW (ref 13.0–17.0)
MCH: 28.2 pg (ref 26.0–34.0)
MCHC: 32.4 g/dL (ref 30.0–36.0)
MCV: 86.9 fL (ref 80.0–100.0)
Platelets: 547 10*3/uL — ABNORMAL HIGH (ref 150–400)
RBC: 4.26 MIL/uL (ref 4.22–5.81)
RDW: 17.2 % — ABNORMAL HIGH (ref 11.5–15.5)
WBC: 13.7 10*3/uL — ABNORMAL HIGH (ref 4.0–10.5)
nRBC: 0 % (ref 0.0–0.2)

## 2019-07-01 LAB — BASIC METABOLIC PANEL
Anion gap: 10 (ref 5–15)
BUN: 41 mg/dL — ABNORMAL HIGH (ref 6–20)
CO2: 31 mmol/L (ref 22–32)
Calcium: 9.2 mg/dL (ref 8.9–10.3)
Chloride: 95 mmol/L — ABNORMAL LOW (ref 98–111)
Creatinine, Ser: 1.64 mg/dL — ABNORMAL HIGH (ref 0.61–1.24)
GFR calc Af Amer: 54 mL/min — ABNORMAL LOW (ref >60)
GFR calc non Af Amer: 47 mL/min — ABNORMAL LOW (ref >60)
Glucose, Bld: 98 mg/dL (ref 70–99)
Potassium: 4.3 mmol/L (ref 3.5–5.1)
Sodium: 136 mmol/L (ref 135–145)

## 2019-07-01 LAB — TROPONIN I (HIGH SENSITIVITY): Troponin I (High Sensitivity): 27 ng/L — ABNORMAL HIGH (ref <18)

## 2019-07-01 LAB — BRAIN NATRIURETIC PEPTIDE: B Natriuretic Peptide: 1793 pg/mL — ABNORMAL HIGH (ref 0.0–100.0)

## 2019-07-01 MED ORDER — FUROSEMIDE 10 MG/ML IJ SOLN
40.0000 mg | Freq: Once | INTRAMUSCULAR | Status: DC
  Filled 2019-07-01: qty 4

## 2019-07-01 MED ORDER — LIDOCAINE HCL (PF) 1 % IJ SOLN
INTRAMUSCULAR | Status: AC
  Administered 2019-07-01: 5 mL via INTRADERMAL
  Filled 2019-07-01: qty 5

## 2019-07-01 MED ORDER — LIDOCAINE HCL (PF) 1 % IJ SOLN
5.0000 mL | Freq: Once | INTRAMUSCULAR | Status: AC
  Administered 2019-07-01: 5 mL via INTRADERMAL

## 2019-07-01 NOTE — ED Notes (Signed)
Lab called to notify of Trop add-on. Lab confirms they will run this soon.

## 2019-07-01 NOTE — ED Notes (Signed)
EDP Quentin Cornwall to bedside.

## 2019-07-01 NOTE — ED Notes (Signed)
EDP Quentin Cornwall verbal okay to give pt food tray and drink.

## 2019-07-01 NOTE — ED Triage Notes (Signed)
Pt arrived to ed via ems from primary doctors office for lower extremity swelling and SOB. Initial BP in Left arm BP 88/59. Pt states lower leg "swelling starting 2 weeks ago when I was admitted for fluid around my lungs", pneumonia.  Pt states he has gained 4lbs in last 24 hours per his scale at home. Left knee visibly swollen compared to left, 2+ edema bilateral lower legs. Pt takes medication (torsemide) for fluid retention x 3 years. Pt does not appear to be in acute distress at this time. RR 18, even unlabored breaths.

## 2019-07-01 NOTE — Telephone Encounter (Signed)
I have made the 2nd attempt to contact the patient or family member in charge, in order to follow up from recently being discharged from the hospital.  

## 2019-07-01 NOTE — ED Triage Notes (Signed)
First RN Note: Pt presents to ED via [facility] with c/o BLE edema with SOB on exertion. Per EMS pt was at F/U doctors up from D/C from hospital for same.   77HR 99% RA 97.5 Temp 94/68

## 2019-07-01 NOTE — ED Provider Notes (Addendum)
[facility] Emergency Department Provider Note    First MD Initiated Contact with Patient 07/01/19 2220     (approximate)  I have reviewed the triage vital signs and the nursing notes.   HISTORY  Chief Complaint Leg Swelling and Shortness of Breath    HPI Stephen [name] is a 53 y.o. male states a cardiac history of nonischemic cardiomyopathy with an EF of 10 to 15% on torsemide presents the ER for worsening exertional dyspnea leg swelling and orthopnea.  Says he is also been having left knee pain since he was recently discharged from the hospital.  Denies any fevers.  No chest pains.  Does have a history of gout    Past Medical History:  Diagnosis Date  . AICD (automatic cardioverter/defibrillator) present   . AKI (acute kidney injury) ([place]) 12/24/2017  . Arrhythmia   . Arthritis    "lower back, knees" (06/10/2018)  . Cardiac arrest ([place]) 12/23/2017   Brief V-fib arrest  . Chest pain 09/08/2017  . CHF (congestive heart failure) (HCC)    nonischemic cardiomyopathy, EF 25%  . Gout    "on daily RX" (06/10/2018)  . Headache    "q couple months" (06/10/2018)  . High cholesterol   . History of kidney stones   . Hypertension   . Influenza A 12/24/2017  . NICM (nonischemic cardiomyopathy) ([place])   . Pneumonia 12/20/2017  . TIA (transient ischemic attack) 06/21/2016   "still affects my memory a little bit" (06/10/2018)   Family History  Problem Relation Age of Onset  . Hypertension Mother   . Heart failure Mother   . Hypertension Father   . CAD Father   . Heart attack Father    Past Surgical History:  Procedure Laterality Date  . EXTERNAL FIXATION LEG Right ~ 2000   "was going bowlegged; had to brake my leg to fix it"  . HERNIA REPAIR    . ICD IMPLANT N/A 12/30/2017   Procedure: ICD IMPLANT;  Surgeon: Deboraha Sprang, MD;  Location: [facility];  Service: Cardiovascular;  Laterality: N/A;  . LEFT HEART CATH AND CORONARY ANGIOGRAPHY N/A  09/09/2017   Procedure: LEFT HEART CATH AND CORONARY ANGIOGRAPHY;  Surgeon: Teodoro Spray, MD;  Location: [facility];  Service: Cardiovascular;  Laterality: N/A;  . UMBILICAL HERNIA REPAIR  1990s  . V TACH ABLATION  06/10/2018  . V TACH ABLATION N/A 06/10/2018   Procedure: V TACH ABLATION;  Surgeon: Thompson Grayer, MD;  Location: [facility];  Service: Cardiovascular;  Laterality: N/A;  . VASECTOMY     Patient Active Problem List   Diagnosis Date Noted  . Acute on chronic systolic CHF (congestive heart failure) ([place]) 05/02/2019  . Lower GI bleed 04/18/2019  . CHF (congestive heart failure) ([place]) 04/07/2019  . Cough 03/17/2019  . 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 HTN (hypertension) 11/20/2018  . Hypokalemia 08/19/2018  . Syncope 06/30/2018  . CKD (chronic kidney disease) stage 3, GFR 30-59 ml/min (HCC) 05/13/2018  . Hyperlipidemia 05/13/2018  . ED (erectile dysfunction) 05/13/2018  . Osteoarthritis of right knee 04/30/2018  . Gout 03/26/2018  . VF (ventricular fibrillation) ([place])   . Nonischemic cardiomyopathy ([place])   . Cardiogenic shock ([place]) 12/24/2017  . 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 History of non-ST elevation myocardial infarction (NSTEMI) 09/08/2017  . Bradycardia 08/30/2017  . Ventricular tachycardia ([place]) 07/29/2017  . COPD (chronic obstructive pulmonary disease) ([place]) 07/04/2017  . TIA (transient ischemic  attack) 06/22/2016  . Benign hypertensive renal disease 06/22/2016  . Chronic systolic congestive heart failure ([place]) 01/20/2016  . Cardiomyopathy ([place]) 01/20/2016      Prior to Admission medications   Medication Sig Start Date End Date Taking? Authorizing Provider  acetaminophen (TYLENOL) 325 MG tablet Take 2 tablets (650 mg total) by mouth every 6 (six) hours as needed. Patient taking differently: Take 325-650 mg by mouth every 6 (six) hours as needed for moderate pain.  03/19/18   Carrie Mew, MD  albuterol (PROVENTIL  HFA;VENTOLIN HFA) 108 (90 Base) MCG/ACT inhaler Inhale 2 puffs into the lungs every 4 (four) hours as needed for wheezing or shortness of breath. 09/19/18   Johnson, Megan P, DO  allopurinol (ZYLOPRIM) 100 MG tablet Take 2 tablets (200 mg total) by mouth daily. Patient taking differently: Take 100 mg by mouth 2 (two) times daily.  10/19/18   Johnson, Megan P, DO  amiodarone (PACERONE) 200 MG tablet Take 200 mg by mouth 2 (two) times daily.    [provider]  aspirin 81 MG chewable tablet Chew 81 mg by mouth daily.    [provider]  atorvastatin (LIPITOR) 40 MG tablet Take 1 tablet (40 mg total) by mouth daily at 6 PM. 04/22/19   [name], FNP  carvedilol (COREG) 3.125 MG tablet Take 3.125 mg by mouth 2 (two) times daily with a meal.    [provider]  clopidogrel (PLAVIX) 75 MG tablet TAKE ONE TABLET BY MOUTH EVERY DAY Patient taking differently: Take 75 mg by mouth daily.  02/13/19   Bensimhon, Shaune Pascal, MD  colchicine 0.6 MG tablet Take 1 tablet (0.6 mg total) by mouth daily as needed (gout flare). 09/03/18   Johnson, Megan P, DO  cyclobenzaprine (FLEXERIL) 10 MG tablet Take 1 tablet (10 mg total) by mouth at bedtime. Patient taking differently: Take 10 mg by mouth at bedtime as needed for muscle spasms.  05/21/19   Park Liter P, DO  diclofenac sodium (VOLTAREN) 1 % GEL Apply 2 g topically 4 (four) times daily as needed (pain).     [provider]  fluticasone (FLONASE) 50 MCG/ACT nasal spray Place 2 sprays into both nostrils daily. Patient taking differently: Place 2 sprays into both nostrils daily as needed for allergies.  06/20/17   [name], MD  fluticasone-salmeterol (ADVAIR HFA) [phone] MCG/ACT inhaler Inhale 2 puffs into the lungs 2 (two) times daily. 08/12/18   [name], MD  Magnesium 400 MG TABS Take 1 tablet by mouth 2 (two) times daily. 04/22/19   [name], FNP  Multiple Vitamin (MULTIVITAMIN WITH MINERALS) TABS tablet Take 1  tablet by mouth daily.    [provider]  nitroGLYCERIN (NITROSTAT) 0.4 MG SL tablet Place 1 tablet (0.4 mg total) under the tongue every 5 (five) minutes as needed for chest pain. 04/22/19   [name], FNP  Nutritional Supplements (FEEDING SUPPLEMENT, NEPRO CARB STEADY,) LIQD Take 237 mLs by mouth 2 (two) times daily between meals. 06/26/19   Fritzi Mandes, MD  polyethylene glycol (MIRALAX / GLYCOLAX) 17 g packet Take 17 g by mouth daily. 04/20/19   Bettey Costa, MD  potassium chloride SA (K-DUR) 20 MEQ tablet Take 1 tablet (20 mEq total) by mouth 2 (two) times daily. 06/26/19   Fritzi Mandes, MD  sennosides-docusate sodium (SENOKOT-S) 8.6-50 MG tablet Take 1 tablet by mouth daily.    [provider]  sodium chloride (OCEAN) 0.65 % SOLN nasal spray Place 1  spray into both nostrils as needed for congestion.    [provider]  spironolactone (ALDACTONE) 25 MG tablet Take 0.5 tablets (12.5 mg total) by mouth daily. 04/15/19   Vaughan Basta, MD  Tiotropium Bromide Monohydrate (SPIRIVA RESPIMAT) 2.5 MCG/ACT AERS Inhale 1 puff into the lungs daily. 08/12/18   [name], MD  torsemide (DEMADEX) 20 MG tablet Take 1 tablet (20 mg total) by mouth 2 (two) times daily. 06/26/19   Fritzi Mandes, MD    Allergies Lisinopril    Social History Social History   Tobacco Use  . Smoking status: Former Smoker    Packs/day: 0.33    Years: 33.00    Pack years: 10.89    Types: Cigarettes    Quit date: 02/20/2016    Years since quitting: 3.3  . Smokeless tobacco: Never Used  Substance Use Topics  . Alcohol use: Not Currently    Alcohol/week: 0.0 standard drinks    Comment: 06/10/2018 "beer once/month; if that"  . Drug use: Never    Review of Systems Patient denies headaches, rhinorrhea, blurry vision, numbness, shortness of breath, chest pain, edema, cough, abdominal pain, nausea, vomiting, diarrhea, dysuria, fevers, rashes or hallucinations unless otherwise stated above in  HPI. ____________________________________________   PHYSICAL EXAM:  VITAL SIGNS: Vitals:   07/01/19 2301 07/01/19 2313  BP:  117/83  Pulse: 68 76  Resp:    Temp:    SpO2: 99% 98%    Constitutional: Alert and oriented.  Eyes: Conjunctivae are normal.  Head: Atraumatic. Nose: No congestion/rhinnorhea. Mouth/Throat: Mucous membranes are moist.   Neck: No stridor. Painless ROM.  Cardiovascular: Normal rate, regular rhythm. Grossly normal heart sounds.  Good peripheral circulation. Respiratory: Normal respiratory effort.  No retractions. Lungs with diminished posterior breathsounds Gastrointestinal: Soft and nontender. No distention. No abdominal bruits. No CVA tenderness. Genitourinary: deferred Musculoskeletal: BLE pitting edema,  + left knee effusion, no significant pain with passive and active range of motion. Neurologic:  Normal speech and language. No gross focal neurologic deficits are appreciated. No facial droop Skin:  Skin is warm, dry and intact. No rash noted. Psychiatric: Mood and affect are normal. Speech and behavior are normal.  ____________________________________________   LABS (all labs ordered are listed, but only abnormal results are displayed)  Results for orders placed or performed during the hospital encounter of 07/01/19 (from the past 24 hour(s))  Basic metabolic panel     Status: Abnormal   Collection Time: 07/01/19  5:10 PM  Result Value Ref Range   Sodium 136 135 - 145 mmol/L   Potassium 4.3 3.5 - 5.1 mmol/L   Chloride 95 (L) 98 - 111 mmol/L   CO2 31 22 - 32 mmol/L   Glucose, Bld 98 70 - 99 mg/dL   BUN 41 (H) 6 - 20 mg/dL   Creatinine, Ser 1.64 (H) 0.61 - 1.24 mg/dL   Calcium 9.2 8.9 - 10.3 mg/dL   GFR calc non Af Amer 47 (L) >60 mL/min   GFR calc Af Amer 54 (L) >60 mL/min   Anion gap 10 5 - 15  CBC     Status: Abnormal   Collection Time: 07/01/19  5:10 PM  Result Value Ref Range   WBC 13.7 (H) 4.0 - 10.5 K/uL   RBC 4.26 4.22 - 5.81  MIL/uL   Hemoglobin 12.0 (L) 13.0 - 17.0 g/dL   HCT 37.0 (L) 39.0 - 52.0 %   MCV 86.9 80.0 - 100.0 fL   MCH 28.2 26.0 - 34.0  pg   MCHC 32.4 30.0 - 36.0 g/dL   RDW 17.2 (H) 11.5 - 15.5 %   Platelets 547 (H) 150 - 400 K/uL   nRBC 0.0 0.0 - 0.2 %  Brain natriuretic peptide     Status: Abnormal   Collection Time: 07/01/19  5:10 PM  Result Value Ref Range   B Natriuretic Peptide 1,793.0 (H) 0.0 - 100.0 pg/mL  Hepatic function panel     Status: Abnormal   Collection Time: 07/01/19  5:10 PM  Result Value Ref Range   Total Protein 7.4 6.5 - 8.1 g/dL   Albumin 2.7 (L) 3.5 - 5.0 g/dL   AST 29 15 - 41 U/L   ALT 55 (H) 0 - 44 U/L   Alkaline Phosphatase 109 38 - 126 U/L   Total Bilirubin 1.4 (H) 0.3 - 1.2 mg/dL   Bilirubin, Direct 0.4 (H) 0.0 - 0.2 mg/dL   Indirect Bilirubin 1.0 (H) 0.3 - 0.9 mg/dL  Troponin I (High Sensitivity)     Status: Abnormal   Collection Time: 07/01/19  5:10 PM  Result Value Ref Range   Troponin I (High Sensitivity) 27 (H) <18 ng/L  SARS Coronavirus 2 (CEPHEID- Performed in [facility] hospital lab), Hosp Order     Status: None   Collection Time: 07/01/19 10:54 PM   Specimen: Nasopharyngeal Swab  Result Value Ref Range   SARS Coronavirus 2 NEGATIVE NEGATIVE   ____________________________________________  EKG My review and personal interpretation at Time: 17:05   Indication: sob  Rate: 75  Rhythm: sinus Axis: left Other: lbbb, no sgarbossa ____________________________________________  RADIOLOGY  I personally reviewed all radiographic images ordered to evaluate for the above acute complaints and reviewed radiology reports and findings.  These findings were personally discussed with the patient.  Please see medical record for radiology report.  ____________________________________________   PROCEDURES  Procedure(s) performed:  .Joint Aspiration/Arthrocentesis  Date/Time: 07/02/2019 12:01 AM Performed by: Merlyn Lot, MD Authorized by: Merlyn Lot, MD   Consent:    Consent obtained:  Verbal   Consent given by:  Patient   Risks discussed:  Pain, infection, bleeding, incomplete drainage, nerve damage and poor cosmetic result   Alternatives discussed:  No treatment, delayed treatment, alternative treatment, referral and observation Location:    Location:  Knee   Knee:  L knee Anesthesia (see MAR for exact dosages):    Anesthesia method:  Local infiltration   Local anesthetic:  Lidocaine 1% w/o epi Procedure details:    Preparation: Patient was prepped and draped in usual sterile fashion     Needle gauge:  20 G   Ultrasound guidance: no     Approach:  Lateral   Aspirate amount:  30   Aspirate characteristics:  Cloudy and serous   Steroid injected: no     Specimen collected: yes   Post-procedure details:    Dressing:  Adhesive bandage   Patient tolerance of procedure:  Tolerated well, no immediate complications      Critical Care performed: no ____________________________________________   INITIAL IMPRESSION / ASSESSMENT AND PLAN / ED COURSE  Pertinent labs & imaging results that were available during my care of the patient were reviewed by me and considered in my medical decision making (see chart for details).   DDX: Anasarca, CHF, dehydration, anemia, well but heart failure, arthritis, septic arthritis, gout  Stephen [name] is a 69 y.o. who presents to the ED with symptoms as described above.  Patient endorsing worsening swelling and weight gain  despite increasing his home torsemide and given his extensive cardiomyopathy concerning for volume overload.  Also complaining of left knee pain.  The patient will be placed on continuous pulse oximetry and telemetry for monitoring.  Laboratory evaluation will be sent to evaluate for the above complaints.     Clinical Course as of Jul 02 27  Wed Jul 01, 2019  2350 Patient is complaining of left knee pain.  Is somewhat warm but has painless range of motion.  He is  requesting arthrocentesis for think is reasonable given his pain.  We will also evaluate for any evidence of gout which she has is a history.  Does not seem consistent with septic arthritis.  Regards patient does have significant exertional dyspnea and given his weight gain despite increasing his torsemide at home concern for worsening heart failure in the setting of significant cardiomyopathy and CHF with a EF of 10-15 will discuss with hospitalist for admission.   [PR]  Thu Jul 02, 2019  0027 Discussed case with hospitalist regarding plan for admission for diuresis cardiology consult as well as follow-up on synovial fluid studies.     [PR]    Clinical Course User Index [PR] Merlyn Lot, MD   The patient was evaluated in Emergency Department today for the symptoms described in the history of present illness. He/she was evaluated in the context of the global COVID-19 pandemic, which necessitated consideration that the patient might be at risk for infection with the SARS-CoV-2 virus that causes COVID-19. Institutional protocols and algorithms that pertain to the evaluation of patients at risk for COVID-19 are in a state of rapid change based on information released by regulatory bodies including the CDC and federal and state organizations. These policies and algorithms were followed during the patient's care in the ED.   As part of my medical decision making, I reviewed the following data within the [facility] notes reviewed and incorporated, Labs reviewed, notes from prior ED visits and [place] Controlled Substance Database   ____________________________________________   FINAL CLINICAL IMPRESSION(S) / ED DIAGNOSES  Final diagnoses:  Peripheral edema  Orthopnea  Hypotension, unspecified hypotension type  Effusion of left knee      NEW MEDICATIONS STARTED DURING THIS VISIT:  New Prescriptions   No medications on file     Note:  This document was prepared  using Dragon voice recognition software and may include unintentional dictation errors.    Merlyn Lot, MD 07/02/19 Judithann Sauger    Merlyn Lot, MD 07/02/19 Shelah Lewandowsky

## 2019-07-01 NOTE — ED Notes (Signed)
Pt c/o recent change in taste but states no changes with smell; pt c/o SOB, cough, and leg swelling 1-2+ bilat; denies CP/N/D; states he vomited once today.

## 2019-07-01 NOTE — ED Notes (Signed)
Lab confirms bloodwork still running. States will be complete/result soon.

## 2019-07-01 NOTE — Progress Notes (Signed)
BP 94/66 (BP Location: Right Arm, Patient Position: Sitting, Cuff Size: Normal)   Pulse 79   Temp 98.5 F (36.9 C) (Oral)   Ht 6\' 1"  (1.854 m)   SpO2 97%   BMI 27.77 kg/m    Subjective:    Patient ID: Stephen [name], male    DOB: [birth year], 53 y.o.   MRN: [id number]  HPI: Stephen [name] is a 53 y.o. male  Chief Complaint  Patient presents with  . Hospitalization Follow-up  . Foot Swelling    bilateral, occuring since out of the hospital   Patient presenting today for hospital follow up after a recent prolonged hospitalization for severe SOB d/t HF exacerbation, COPD, and multiple other chronic conditions. Was discharged home on 6/26 after 14 days hospitalized feeling mildly better. Was dialyzed during admission to help with fluid overload with some relief. No medication changes upon discharge. Taking all medications faithfully as instructed. Now with worsened swelling in b/l LEs and significantly SOB (patient states almost as bad as several weeks ago when he presented to the hospital). Was unable to attend his HF clinic appt earlier this week d/t lack of transportation. Has upcoming Cardiology f/u next week both with his usual Cardiologist as well as a transplant specialist Cardiologist with [facility].   His main concern today aside from worsening SOB and edema is exacerbated left knee pain and swelling. No injury, states this issue has been ongoing for years and is severely painful. He would like the knee drained today. No fevers, chills, sweats, redness, skin breakdown. Not trying anything OTC for sxs.    Relevant past medical, surgical, family and social history reviewed and updated as indicated. Interim medical history since our last visit reviewed. Allergies and medications reviewed and updated.  Review of Systems  Per HPI unless specifically indicated above     Objective:    BP 94/66 (BP Location: Right Arm, Patient Position: Sitting, Cuff Size: Normal)   Pulse 79    Temp 98.5 F (36.9 C) (Oral)   Ht 6\' 1"  (1.854 m)   SpO2 97%   BMI 27.77 kg/m   Wt Readings from Last 3 Encounters:  07/05/19 199 lb 4.7 oz (90.4 kg)  06/26/19 210 lb 8 oz (95.5 kg)  05/30/19 218 lb (98.9 kg)    Physical Exam Vitals signs and nursing note reviewed.  Constitutional:      Appearance: Normal appearance.  HENT:     Head: Atraumatic.  Eyes:     Extraocular Movements: Extraocular movements intact.     Conjunctiva/sclera: Conjunctivae normal.  Neck:     Musculoskeletal: Normal range of motion and neck supple.  Cardiovascular:     Rate and Rhythm: Normal rate.  Pulmonary:     Effort: Pulmonary effort is normal. No respiratory distress.  Musculoskeletal:     Comments: Sitting in wheelchair, patient states exertion to stand for an exam causes significant SOB and wishes to refrain from exam.  Left knee swollen in multiple areas anteriorly No obvious signs of infection in the knee  Skin:    General: Skin is warm and dry.  Neurological:     General: No focal deficit present.     Mental Status: He is oriented to person, place, and time.  Psychiatric:        Mood and Affect: Mood normal.        Thought Content: Thought content normal.        Judgment: Judgment normal.     Results  for orders placed or performed during the hospital encounter of 06/12/19  SARS Coronavirus 2 (CEPHEID- Performed in [place] hospital lab), [facility] Order   Specimen: Nasopharyngeal Swab  Result Value Ref Range   SARS Coronavirus 2 NEGATIVE NEGATIVE  Expectorated sputum assessment w rflx to resp cult   Specimen: Expectorated Sputum  Result Value Ref Range   Specimen Description EXPECTORATED SPUTUM    Special Requests Normal    Sputum evaluation      THIS SPECIMEN IS ACCEPTABLE FOR SPUTUM CULTURE Performed at [facility], [address] [place], [place] [address]    Report Status 06/16/2019 FINAL   Culture, respiratory  Result Value Ref Range   Specimen Description       EXPECTORATED SPUTUM Performed at [facility], [address], [place], [place] [address]    Special Requests      Normal Reflexed from [id number] Performed at [facility], [place], [place] [address]    Gram Stain      MODERATE WBC PRESENT, PREDOMINANTLY PMN ABUNDANT GRAM POSITIVE COCCI ABUNDANT GRAM POSITIVE RODS    Culture      ABUNDANT Consistent with normal respiratory flora. Performed at [facility], [place] [address], [place], [place] [address]    Report Status 06/18/2019 FINAL   MRSA PCR Screening   Specimen: Nasopharyngeal  Result Value Ref Range   MRSA by PCR NEGATIVE NEGATIVE  Basic metabolic panel  Result Value Ref Range   Sodium 134 (L) 135 - 145 mmol/L   Potassium 4.8 3.5 - 5.1 mmol/L   Chloride 103 98 - 111 mmol/L   CO2 19 (L) 22 - 32 mmol/L   Glucose, Bld 172 (H) 70 - 99 mg/dL   BUN 27 (H) 6 - 20 mg/dL   Creatinine, Ser 1.91 (H) 0.61 - 1.24 mg/dL   Calcium 9.4 8.9 - 10.3 mg/dL   GFR calc non Af Amer 39 (L) >60 mL/min   GFR calc Af Amer 45 (L) >60 mL/min   Anion gap 12 5 - 15  CBC  Result Value Ref Range   WBC 8.1 4.0 - 10.5 K/uL   RBC 5.02 4.22 - 5.81 MIL/uL   Hemoglobin 14.3 13.0 - 17.0 g/dL   HCT 44.2 39.0 - 52.0 %   MCV 88.0 80.0 - 100.0 fL   MCH 28.5 26.0 - 34.0 pg   MCHC 32.4 30.0 - 36.0 g/dL   RDW 17.1 (H) 11.5 - 15.5 %   Platelets 384 150 - 400 K/uL   nRBC 0.0 0.0 - 0.2 %  Troponin I - ONCE - STAT  Result Value Ref Range   Troponin I 0.07 (HH) <0.03 ng/mL  Protime-INR (order if Patient is taking Coumadin / Warfarin)  Result Value Ref Range   Prothrombin Time 13.9 11.4 - 15.2 seconds   INR 1.1 0.8 - 1.2  Hepatic function panel  Result Value Ref Range   Total Protein 7.5 6.5 - 8.1 g/dL   Albumin 3.5 3.5 - 5.0 g/dL   AST 14 (L) 15 - 41 U/L   ALT 14 0 - 44 U/L   Alkaline Phosphatase 104 38 - 126 U/L   Total Bilirubin 0.7 0.3 - 1.2 mg/dL   Bilirubin, Direct 0.2 0.0 - 0.2 mg/dL   Indirect Bilirubin 0.5  0.3 - 0.9 mg/dL  Lipase, blood  Result Value Ref Range   Lipase 20 11 - 51 U/L  Basic metabolic panel  Result Value Ref Range  Sodium 137 135 - 145 mmol/L   Potassium 3.8 3.5 - 5.1 mmol/L   Chloride 102 98 - 111 mmol/L   CO2 25 22 - 32 mmol/L   Glucose, Bld 151 (H) 70 - 99 mg/dL   BUN 28 (H) 6 - 20 mg/dL   Creatinine, Ser 1.99 (H) 0.61 - 1.24 mg/dL   Calcium 9.2 8.9 - 10.3 mg/dL   GFR calc non Af Amer 37 (L) >60 mL/min   GFR calc Af Amer 43 (L) >60 mL/min   Anion gap 10 5 - 15  CBC  Result Value Ref Range   WBC 7.5 4.0 - 10.5 K/uL   RBC 4.73 4.22 - 5.81 MIL/uL   Hemoglobin 13.4 13.0 - 17.0 g/dL   HCT 41.4 39.0 - 52.0 %   MCV 87.5 80.0 - 100.0 fL   MCH 28.3 26.0 - 34.0 pg   MCHC 32.4 30.0 - 36.0 g/dL   RDW 16.7 (H) 11.5 - 15.5 %   Platelets 337 150 - 400 K/uL   nRBC 0.0 0.0 - 0.2 %  Troponin I - Now Then Q6H  Result Value Ref Range   Troponin I 0.10 (HH) <0.03 ng/mL  Troponin I - Now Then Q6H  Result Value Ref Range   Troponin I 0.12 (HH) <0.03 ng/mL  Magnesium  Result Value Ref Range   Magnesium 2.2 1.7 - 2.4 mg/dL  Basic metabolic panel  Result Value Ref Range   Sodium 140 135 - 145 mmol/L   Potassium 3.6 3.5 - 5.1 mmol/L   Chloride 105 98 - 111 mmol/L   CO2 22 22 - 32 mmol/L   Glucose, Bld 112 (H) 70 - 99 mg/dL   BUN 32 (H) 6 - 20 mg/dL   Creatinine, Ser 1.80 (H) 0.61 - 1.24 mg/dL   Calcium 9.5 8.9 - 10.3 mg/dL   GFR calc non Af Amer 42 (L) >60 mL/min   GFR calc Af Amer 48 (L) >60 mL/min   Anion gap 13 5 - 15  Magnesium  Result Value Ref Range   Magnesium 2.3 1.7 - 2.4 mg/dL  H. pylori breath test  Result Value Ref Range   H. pylori UBiT Negative Negative  Potassium  Result Value Ref Range   Potassium 5.2 (H) 3.5 - 5.1 mmol/L  Glucose, capillary  Result Value Ref Range   Glucose-Capillary 180 (H) 70 - 99 mg/dL   Comment 1 Notify RN    Comment 2 Document in Chart   Basic metabolic panel  Result Value Ref Range   Sodium 140 135 - 145 mmol/L    Potassium 5.2 (H) 3.5 - 5.1 mmol/L   Chloride 107 98 - 111 mmol/L   CO2 21 (L) 22 - 32 mmol/L   Glucose, Bld 142 (H) 70 - 99 mg/dL   BUN 36 (H) 6 - 20 mg/dL   Creatinine, Ser 2.28 (H) 0.61 - 1.24 mg/dL   Calcium 9.7 8.9 - 10.3 mg/dL   GFR calc non Af Amer 31 (L) >60 mL/min   GFR calc Af Amer 36 (L) >60 mL/min   Anion gap 12 5 - 15  Basic metabolic panel  Result Value Ref Range   Sodium 137 135 - 145 mmol/L   Potassium 5.6 (H) 3.5 - 5.1 mmol/L   Chloride 102 98 - 111 mmol/L   CO2 22 22 - 32 mmol/L   Glucose, Bld 156 (H) 70 - 99 mg/dL   BUN 51 (H) 6 - 20 mg/dL   Creatinine,  Ser 3.29 (H) 0.61 - 1.24 mg/dL   Calcium 9.7 8.9 - 10.3 mg/dL   GFR calc non Af Amer 20 (L) >60 mL/min   GFR calc Af Amer 23 (L) >60 mL/min   Anion gap 13 5 - 15  CBC  Result Value Ref Range   WBC 11.6 (H) 4.0 - 10.5 K/uL   RBC 4.60 4.22 - 5.81 MIL/uL   Hemoglobin 13.2 13.0 - 17.0 g/dL   HCT 41.6 39.0 - 52.0 %   MCV 90.4 80.0 - 100.0 fL   MCH 28.7 26.0 - 34.0 pg   MCHC 31.7 30.0 - 36.0 g/dL   RDW 17.3 (H) 11.5 - 15.5 %   Platelets 295 150 - 400 K/uL   nRBC 0.0 0.0 - 0.2 %  Hemoglobin and hematocrit, blood  Result Value Ref Range   Hemoglobin 13.5 13.0 - 17.0 g/dL   HCT 42.1 39.0 - 52.0 %  H Pylori, IGM, IGG, IGA AB  Result Value Ref Range   H. Pylogi, Iga Abs <9.0 0.0 - 8.9 units   H. Pylogi, Igm Abs <9.0 0.0 - 8.9 units   H Pylori IgG 0.25 0.00 - 0.79 Index Value  Basic metabolic panel  Result Value Ref Range   Sodium 133 (L) 135 - 145 mmol/L   Potassium 6.2 (H) 3.5 - 5.1 mmol/L   Chloride 97 (L) 98 - 111 mmol/L   CO2 21 (L) 22 - 32 mmol/L   Glucose, Bld 144 (H) 70 - 99 mg/dL   BUN 72 (H) 6 - 20 mg/dL   Creatinine, Ser 4.06 (H) 0.61 - 1.24 mg/dL   Calcium 9.7 8.9 - 10.3 mg/dL   GFR calc non Af Amer 16 (L) >60 mL/min   GFR calc Af Amer 18 (L) >60 mL/min   Anion gap 15 5 - 15  Renal function panel  Result Value Ref Range   Sodium 133 (L) 135 - 145 mmol/L   Potassium 6.2 (H) 3.5 - 5.1 mmol/L    Chloride 97 (L) 98 - 111 mmol/L   CO2 21 (L) 22 - 32 mmol/L   Glucose, Bld 144 (H) 70 - 99 mg/dL   BUN 74 (H) 6 - 20 mg/dL   Creatinine, Ser 4.17 (H) 0.61 - 1.24 mg/dL   Calcium 9.7 8.9 - 10.3 mg/dL   Phosphorus 4.8 (H) 2.5 - 4.6 mg/dL   Albumin 3.3 (L) 3.5 - 5.0 g/dL   GFR calc non Af Amer 15 (L) >60 mL/min   GFR calc Af Amer 18 (L) >60 mL/min   Anion gap 15 5 - 15  Hepatitis B surface antibody,qualitative  Result Value Ref Range   Hep B S Ab Non Reactive   Hepatitis B core antibody, IgM  Result Value Ref Range   Hep B C IgM Negative Negative  Hepatitis B surface antigen  Result Value Ref Range   Hepatitis B Surface Ag Negative Negative  QuantiFERON-TB Gold Plus  Result Value Ref Range   QuantiFERON Incubation Incubation performed.    QuantiFERON-TB Gold Plus Indeterminate (A) Negative  CBC  Result Value Ref Range   WBC 11.2 (H) 4.0 - 10.5 K/uL   RBC 4.27 4.22 - 5.81 MIL/uL   Hemoglobin 12.1 (L) 13.0 - 17.0 g/dL   HCT 36.9 (L) 39.0 - 52.0 %   MCV 86.4 80.0 - 100.0 fL   MCH 28.3 26.0 - 34.0 pg   MCHC 32.8 30.0 - 36.0 g/dL   RDW 16.8 (H) 11.5 - 15.5 %  Platelets 250 150 - 400 K/uL   nRBC 0.5 (H) 0.0 - 0.2 %  Basic metabolic panel  Result Value Ref Range   Sodium 131 (L) 135 - 145 mmol/L   Potassium 5.3 (H) 3.5 - 5.1 mmol/L   Chloride 97 (L) 98 - 111 mmol/L   CO2 19 (L) 22 - 32 mmol/L   Glucose, Bld 152 (H) 70 - 99 mg/dL   BUN 93 (H) 6 - 20 mg/dL   Creatinine, Ser 3.99 (H) 0.61 - 1.24 mg/dL   Calcium 9.4 8.9 - 10.3 mg/dL   GFR calc non Af Amer 16 (L) >60 mL/min   GFR calc Af Amer 18 (L) >60 mL/min   Anion gap 15 5 - 15  Occult blood card to lab, stool  Result Value Ref Range   Fecal Occult Bld POSITIVE (A) NEGATIVE  Hemoglobin and hematocrit, blood  Result Value Ref Range   Hemoglobin 12.0 (L) 13.0 - 17.0 g/dL   HCT 36.6 (L) 39.0 - 52.0 %  Renal function panel  Result Value Ref Range   Sodium 131 (L) 135 - 145 mmol/L   Potassium 4.5 3.5 - 5.1 mmol/L    Chloride 94 (L) 98 - 111 mmol/L   CO2 20 (L) 22 - 32 mmol/L   Glucose, Bld 125 (H) 70 - 99 mg/dL   BUN 103 (H) 6 - 20 mg/dL   Creatinine, Ser 3.38 (H) 0.61 - 1.24 mg/dL   Calcium 9.3 8.9 - 10.3 mg/dL   Phosphorus 4.9 (H) 2.5 - 4.6 mg/dL   Albumin 2.8 (L) 3.5 - 5.0 g/dL   GFR calc non Af Amer 19 (L) >60 mL/min   GFR calc Af Amer 23 (L) >60 mL/min   Anion gap 17 (H) 5 - 15  CBC  Result Value Ref Range   WBC 10.0 4.0 - 10.5 K/uL   RBC 4.07 (L) 4.22 - 5.81 MIL/uL   Hemoglobin 11.6 (L) 13.0 - 17.0 g/dL   HCT 35.3 (L) 39.0 - 52.0 %   MCV 86.7 80.0 - 100.0 fL   MCH 28.5 26.0 - 34.0 pg   MCHC 32.9 30.0 - 36.0 g/dL   RDW 16.8 (H) 11.5 - 15.5 %   Platelets 227 150 - 400 K/uL   nRBC 1.6 (H) 0.0 - 0.2 %  QuantiFERON-TB Gold Plus  Result Value Ref Range   QuantiFERON Criteria Comment    QuantiFERON TB1 Ag Value 0.01 IU/mL   QuantiFERON TB2 Ag Value 0.01 IU/mL   QuantiFERON Nil Value 0.01 IU/mL   QuantiFERON Mitogen Value 0.09 IU/mL  Basic metabolic panel  Result Value Ref Range   Sodium 130 (L) 135 - 145 mmol/L   Potassium 4.7 3.5 - 5.1 mmol/L   Chloride 93 (L) 98 - 111 mmol/L   CO2 21 (L) 22 - 32 mmol/L   Glucose, Bld 153 (H) 70 - 99 mg/dL   BUN 111 (H) 6 - 20 mg/dL   Creatinine, Ser 3.30 (H) 0.61 - 1.24 mg/dL   Calcium 9.3 8.9 - 10.3 mg/dL   GFR calc non Af Amer 20 (L) >60 mL/min   GFR calc Af Amer 23 (L) >60 mL/min   Anion gap 16 (H) 5 - 15  Blood gas, arterial  Result Value Ref Range   FIO2 0.44    Delivery systems HI FLOW NASAL CANNULA    pH, Arterial 7.40 7.350 - 7.450   pCO2 arterial 32 32.0 - 48.0 mmHg   pO2, Arterial 87 83.0 - 108.0  mmHg   Bicarbonate 19.8 (L) 20.0 - 28.0 mmol/L   Acid-base deficit 4.0 (H) 0.0 - 2.0 mmol/L   O2 Saturation 96.6 %   Patient temperature 37.0    Collection site RIGHT RADIAL    Sample type ARTERIAL DRAW    Allens test (pass/fail) PASS PASS  Procalcitonin - Baseline  Result Value Ref Range   Procalcitonin 5.54 ng/mL  Basic metabolic  panel  Result Value Ref Range   Sodium 129 (L) 135 - 145 mmol/L   Potassium 4.3 3.5 - 5.1 mmol/L   Chloride 91 (L) 98 - 111 mmol/L   CO2 22 22 - 32 mmol/L   Glucose, Bld 127 (H) 70 - 99 mg/dL   BUN 105 (H) 6 - 20 mg/dL   Creatinine, Ser 3.10 (H) 0.61 - 1.24 mg/dL   Calcium 9.1 8.9 - 10.3 mg/dL   GFR calc non Af Amer 22 (L) >60 mL/min   GFR calc Af Amer 25 (L) >60 mL/min   Anion gap 16 (H) 5 - 15  Magnesium  Result Value Ref Range   Magnesium 2.9 (H) 1.7 - 2.4 mg/dL  Blood gas, arterial  Result Value Ref Range   FIO2 11LITERS    Delivery systems HIGH FLOW [place]    pH, Arterial 7.49 (H) 7.350 - 7.450   pCO2 arterial 27 (L) 32.0 - 48.0 mmHg   pO2, Arterial 82 (L) 83.0 - 108.0 mmHg   Bicarbonate 20.6 20.0 - 28.0 mmol/L   Acid-base deficit 1.4 0.0 - 2.0 mmol/L   O2 Saturation 96.9 %   Patient temperature 37.0    Collection site RIGHT RADIAL    Sample type ARTERIAL DRAW    Allens test (pass/fail) PASS PASS  Glucose, capillary  Result Value Ref Range   Glucose-Capillary 148 (H) 70 - 99 mg/dL  Renal function panel  Result Value Ref Range   Sodium 130 (L) 135 - 145 mmol/L   Potassium 3.2 (L) 3.5 - 5.1 mmol/L   Chloride 94 (L) 98 - 111 mmol/L   CO2 22 22 - 32 mmol/L   Glucose, Bld 186 (H) 70 - 99 mg/dL   BUN 96 (H) 6 - 20 mg/dL   Creatinine, Ser 2.23 (H) 0.61 - 1.24 mg/dL   Calcium 8.6 (L) 8.9 - 10.3 mg/dL   Phosphorus 3.3 2.5 - 4.6 mg/dL   Albumin 2.5 (L) 3.5 - 5.0 g/dL   GFR calc non Af Amer 32 (L) >60 mL/min   GFR calc Af Amer 37 (L) >60 mL/min   Anion gap 14 5 - 15  CBC  Result Value Ref Range   WBC 9.8 4.0 - 10.5 K/uL   RBC 3.87 (L) 4.22 - 5.81 MIL/uL   Hemoglobin 11.0 (L) 13.0 - 17.0 g/dL   HCT 33.6 (L) 39.0 - 52.0 %   MCV 86.8 80.0 - 100.0 fL   MCH 28.4 26.0 - 34.0 pg   MCHC 32.7 30.0 - 36.0 g/dL   RDW 17.0 (H) 11.5 - 15.5 %   Platelets 218 150 - 400 K/uL   nRBC 5.8 (H) 0.0 - 0.2 %  Renal function panel  Result Value Ref Range   Sodium 133 (L) 135 - 145 mmol/L    Potassium 3.3 (L) 3.5 - 5.1 mmol/L   Chloride 94 (L) 98 - 111 mmol/L   CO2 27 22 - 32 mmol/L   Glucose, Bld 132 (H) 70 - 99 mg/dL   BUN 71 (H) 6 - 20 mg/dL   Creatinine, Ser  1.83 (H) 0.61 - 1.24 mg/dL   Calcium 8.7 (L) 8.9 - 10.3 mg/dL   Phosphorus 2.6 2.5 - 4.6 mg/dL   Albumin 2.6 (L) 3.5 - 5.0 g/dL   GFR calc non Af Amer 41 (L) >60 mL/min   GFR calc Af Amer 47 (L) >60 mL/min   Anion gap 12 5 - 15  CBC  Result Value Ref Range   WBC 10.7 (H) 4.0 - 10.5 K/uL   RBC 4.13 (L) 4.22 - 5.81 MIL/uL   Hemoglobin 11.5 (L) 13.0 - 17.0 g/dL   HCT 35.5 (L) 39.0 - 52.0 %   MCV 86.0 80.0 - 100.0 fL   MCH 27.8 26.0 - 34.0 pg   MCHC 32.4 30.0 - 36.0 g/dL   RDW 17.0 (H) 11.5 - 15.5 %   Platelets 227 150 - 400 K/uL   nRBC 5.7 (H) 0.0 - 0.2 %  Renal function panel  Result Value Ref Range   Sodium 134 (L) 135 - 145 mmol/L   Potassium 3.0 (L) 3.5 - 5.1 mmol/L   Chloride 98 98 - 111 mmol/L   CO2 25 22 - 32 mmol/L   Glucose, Bld 126 (H) 70 - 99 mg/dL   BUN 56 (H) 6 - 20 mg/dL   Creatinine, Ser 1.65 (H) 0.61 - 1.24 mg/dL   Calcium 8.8 (L) 8.9 - 10.3 mg/dL   Phosphorus 2.2 (L) 2.5 - 4.6 mg/dL   Albumin 2.5 (L) 3.5 - 5.0 g/dL   GFR calc non Af Amer 46 (L) >60 mL/min   GFR calc Af Amer 54 (L) >60 mL/min   Anion gap 11 5 - 15  Basic metabolic panel  Result Value Ref Range   Sodium 136 135 - 145 mmol/L   Potassium 3.7 3.5 - 5.1 mmol/L   Chloride 100 98 - 111 mmol/L   CO2 24 22 - 32 mmol/L   Glucose, Bld 125 (H) 70 - 99 mg/dL   BUN 47 (H) 6 - 20 mg/dL   Creatinine, Ser 1.46 (H) 0.61 - 1.24 mg/dL   Calcium 8.8 (L) 8.9 - 10.3 mg/dL   GFR calc non Af Amer 54 (L) >60 mL/min   GFR calc Af Amer >60 >60 mL/min   Anion gap 12 5 - 15  Basic metabolic panel  Result Value Ref Range   Sodium 137 135 - 145 mmol/L   Potassium 3.5 3.5 - 5.1 mmol/L   Chloride 98 98 - 111 mmol/L   CO2 26 22 - 32 mmol/L   Glucose, Bld 105 (H) 70 - 99 mg/dL   BUN 40 (H) 6 - 20 mg/dL   Creatinine, Ser 1.47 (H) 0.61 - 1.24  mg/dL   Calcium 8.7 (L) 8.9 - 10.3 mg/dL   GFR calc non Af Amer 53 (L) >60 mL/min   GFR calc Af Amer >60 >60 mL/min   Anion gap 13 5 - 15  Glucose, capillary  Result Value Ref Range   Glucose-Capillary 115 (H) 70 - 99 mg/dL      Assessment & Plan:   Problem List Items Addressed This Visit      Cardiovascular and Mediastinum   HTN (hypertension)    Borderline hypotensive today, but asymptomatic. Patient requesting to go back to the hospital d/t his worsening SOB from acute on chronic HF. He was [facility] by [facility] ED for monitoring      Acute on chronic systolic CHF (congestive heart failure) ([place]) - Primary    With frequent hospitalizations given  severity of his disease. Awaiting heart transplant through [facility] currently. Currently symptomatic with SOB and leg edema b/l, but cannot increase diuretics given borderline hypotension. Patient wishes to go back to the hospital to be dialyzed as he states this was the best relief he's gotten lately. He has requested that we call him an ambulance as he does not have transportation. 911 called, patient taken to [facility] ED as requested.        Other Visit Diagnoses    Chronic pain of left knee       Referral placed to [facility] per his request for mgmt of his chronic knee pain and effusion. Will not drain today as swelling is diffuse. Await ER evaluation   Relevant Orders   AMB referral to orthopedics       Follow up plan: Return for as scheduled.

## 2019-07-02 ENCOUNTER — Emergency Department: Payer: PRIVATE HEALTH INSURANCE

## 2019-07-02 DIAGNOSIS — I509 Heart failure, unspecified: Secondary | ICD-10-CM

## 2019-07-02 DIAGNOSIS — Z87442 Personal history of urinary calculi: Secondary | ICD-10-CM | POA: Diagnosis not present

## 2019-07-02 DIAGNOSIS — J9601 Acute respiratory failure with hypoxia: Secondary | ICD-10-CM | POA: Diagnosis present

## 2019-07-02 DIAGNOSIS — Z7982 Long term (current) use of aspirin: Secondary | ICD-10-CM | POA: Diagnosis not present

## 2019-07-02 DIAGNOSIS — Z9852 Vasectomy status: Secondary | ICD-10-CM | POA: Diagnosis not present

## 2019-07-02 DIAGNOSIS — I5023 Acute on chronic systolic (congestive) heart failure: Secondary | ICD-10-CM | POA: Diagnosis present

## 2019-07-02 DIAGNOSIS — N183 Chronic kidney disease, stage 3 (moderate): Secondary | ICD-10-CM | POA: Diagnosis present

## 2019-07-02 DIAGNOSIS — E875 Hyperkalemia: Secondary | ICD-10-CM | POA: Diagnosis present

## 2019-07-02 DIAGNOSIS — Z8673 Personal history of transient ischemic attack (TIA), and cerebral infarction without residual deficits: Secondary | ICD-10-CM | POA: Diagnosis not present

## 2019-07-02 DIAGNOSIS — I420 Dilated cardiomyopathy: Secondary | ICD-10-CM | POA: Diagnosis not present

## 2019-07-02 DIAGNOSIS — M109 Gout, unspecified: Secondary | ICD-10-CM | POA: Diagnosis present

## 2019-07-02 DIAGNOSIS — T82898A Other specified complication of vascular prosthetic devices, implants and grafts, initial encounter: Secondary | ICD-10-CM | POA: Diagnosis not present

## 2019-07-02 DIAGNOSIS — J810 Acute pulmonary edema: Secondary | ICD-10-CM | POA: Diagnosis not present

## 2019-07-02 DIAGNOSIS — T8249XA Other complication of vascular dialysis catheter, initial encounter: Secondary | ICD-10-CM | POA: Diagnosis not present

## 2019-07-02 DIAGNOSIS — N179 Acute kidney failure, unspecified: Secondary | ICD-10-CM | POA: Diagnosis not present

## 2019-07-02 DIAGNOSIS — Z515 Encounter for palliative care: Secondary | ICD-10-CM | POA: Diagnosis not present

## 2019-07-02 DIAGNOSIS — Z7189 Other specified counseling: Secondary | ICD-10-CM | POA: Diagnosis not present

## 2019-07-02 DIAGNOSIS — E7800 Pure hypercholesterolemia, unspecified: Secondary | ICD-10-CM | POA: Diagnosis present

## 2019-07-02 DIAGNOSIS — Z8674 Personal history of sudden cardiac arrest: Secondary | ICD-10-CM | POA: Diagnosis not present

## 2019-07-02 DIAGNOSIS — Z79899 Other long term (current) drug therapy: Secondary | ICD-10-CM | POA: Diagnosis not present

## 2019-07-02 DIAGNOSIS — Z7951 Long term (current) use of inhaled steroids: Secondary | ICD-10-CM | POA: Diagnosis not present

## 2019-07-02 DIAGNOSIS — M170 Bilateral primary osteoarthritis of knee: Secondary | ICD-10-CM | POA: Diagnosis present

## 2019-07-02 DIAGNOSIS — I5021 Acute systolic (congestive) heart failure: Secondary | ICD-10-CM | POA: Diagnosis not present

## 2019-07-02 DIAGNOSIS — Z95810 Presence of automatic (implantable) cardiac defibrillator: Secondary | ICD-10-CM | POA: Diagnosis not present

## 2019-07-02 DIAGNOSIS — N186 End stage renal disease: Secondary | ICD-10-CM | POA: Diagnosis not present

## 2019-07-02 DIAGNOSIS — E785 Hyperlipidemia, unspecified: Secondary | ICD-10-CM | POA: Diagnosis present

## 2019-07-02 DIAGNOSIS — Z20828 Contact with and (suspected) exposure to other viral communicable diseases: Secondary | ICD-10-CM | POA: Diagnosis present

## 2019-07-02 DIAGNOSIS — M25462 Effusion, left knee: Secondary | ICD-10-CM | POA: Diagnosis present

## 2019-07-02 DIAGNOSIS — I130 Hypertensive heart and chronic kidney disease with heart failure and stage 1 through stage 4 chronic kidney disease, or unspecified chronic kidney disease: Secondary | ICD-10-CM | POA: Diagnosis present

## 2019-07-02 DIAGNOSIS — R0601 Orthopnea: Secondary | ICD-10-CM | POA: Diagnosis present

## 2019-07-02 DIAGNOSIS — Y838 Other surgical procedures as the cause of abnormal reaction of the patient, or of later complication, without mention of misadventure at the time of the procedure: Secondary | ICD-10-CM | POA: Diagnosis not present

## 2019-07-02 DIAGNOSIS — J449 Chronic obstructive pulmonary disease, unspecified: Secondary | ICD-10-CM | POA: Diagnosis present

## 2019-07-02 DIAGNOSIS — Z7902 Long term (current) use of antithrombotics/antiplatelets: Secondary | ICD-10-CM | POA: Diagnosis not present

## 2019-07-02 DIAGNOSIS — E872 Acidosis: Secondary | ICD-10-CM | POA: Diagnosis present

## 2019-07-02 LAB — SYNOVIAL CELL COUNT + DIFF, W/ CRYSTALS
Eosinophils-Synovial: 0 %
Lymphocytes-Synovial Fld: 0 %
Monocyte-Macrophage-Synovial Fluid: 17 %
Neutrophil, Synovial: 83 %
WBC, Synovial: 40521 /mm3 — ABNORMAL HIGH (ref 0–200)

## 2019-07-02 LAB — HEPATIC FUNCTION PANEL
ALT: 55 U/L — ABNORMAL HIGH (ref 0–44)
AST: 29 U/L (ref 15–41)
Albumin: 2.7 g/dL — ABNORMAL LOW (ref 3.5–5.0)
Alkaline Phosphatase: 109 U/L (ref 38–126)
Bilirubin, Direct: 0.4 mg/dL — ABNORMAL HIGH (ref 0.0–0.2)
Indirect Bilirubin: 1 mg/dL — ABNORMAL HIGH (ref 0.3–0.9)
Total Bilirubin: 1.4 mg/dL — ABNORMAL HIGH (ref 0.3–1.2)
Total Protein: 7.4 g/dL (ref 6.5–8.1)

## 2019-07-02 LAB — SARS CORONAVIRUS 2 BY RT PCR (HOSPITAL ORDER, PERFORMED IN ~~LOC~~ HOSPITAL LAB): SARS Coronavirus 2: NEGATIVE

## 2019-07-02 MED ORDER — ASPIRIN 81 MG PO CHEW
81.0000 mg | CHEWABLE_TABLET | Freq: Every day | ORAL | Status: DC
  Administered 2019-07-02 – 2019-07-08 (×5): 81 mg via ORAL
  Filled 2019-07-02 (×6): qty 1

## 2019-07-02 MED ORDER — NEPRO/CARBSTEADY PO LIQD
237.0000 mL | Freq: Two times a day (BID) | ORAL | Status: DC
  Administered 2019-07-03 – 2019-07-08 (×3): 237 mL via ORAL

## 2019-07-02 MED ORDER — SODIUM CHLORIDE 0.9 % IV SOLN: 250.0000 mL | INTRAVENOUS | Status: DC | PRN

## 2019-07-02 MED ORDER — ACETAMINOPHEN 325 MG PO TABS
650.0000 mg | ORAL_TABLET | ORAL | Status: DC | PRN
  Administered 2019-07-03 – 2019-07-07 (×5): 650 mg via ORAL
  Filled 2019-07-02 (×5): qty 2

## 2019-07-02 MED ORDER — ONDANSETRON HCL 4 MG/2ML IJ SOLN
4.0000 mg | Freq: Four times a day (QID) | INTRAMUSCULAR | Status: DC | PRN
  Administered 2019-07-03 (×2): 4 mg via INTRAVENOUS
  Filled 2019-07-02 (×2): qty 2

## 2019-07-02 MED ORDER — MAGNESIUM OXIDE 400 (241.3 MG) MG PO TABS
400.0000 mg | ORAL_TABLET | Freq: Two times a day (BID) | ORAL | Status: DC
  Administered 2019-07-02 – 2019-07-08 (×11): 400 mg via ORAL
  Filled 2019-07-02 (×12): qty 1

## 2019-07-02 MED ORDER — FUROSEMIDE 10 MG/ML IJ SOLN
40.0000 mg | Freq: Two times a day (BID) | INTRAMUSCULAR | Status: DC
  Administered 2019-07-02 – 2019-07-03 (×3): 40 mg via INTRAVENOUS
  Filled 2019-07-02 (×2): qty 4

## 2019-07-02 MED ORDER — ALLOPURINOL 100 MG PO TABS
200.0000 mg | ORAL_TABLET | Freq: Every day | ORAL | Status: DC
  Administered 2019-07-02 – 2019-07-08 (×5): 200 mg via ORAL
  Filled 2019-07-02 (×7): qty 2

## 2019-07-02 MED ORDER — PHENOL 1.4 % MT LIQD
1.0000 | OROMUCOSAL | Status: DC | PRN
  Administered 2019-07-02: 1 via OROMUCOSAL
  Filled 2019-07-02: qty 177

## 2019-07-02 MED ORDER — MOMETASONE FURO-FORMOTEROL FUM 200-5 MCG/ACT IN AERO
2.0000 | INHALATION_SPRAY | Freq: Two times a day (BID) | RESPIRATORY_TRACT | Status: DC
  Administered 2019-07-02 – 2019-07-06 (×8): 2 via RESPIRATORY_TRACT
  Filled 2019-07-02 (×2): qty 8.8

## 2019-07-02 MED ORDER — ATORVASTATIN CALCIUM 20 MG PO TABS
40.0000 mg | ORAL_TABLET | Freq: Every day | ORAL | Status: DC
  Administered 2019-07-02 – 2019-07-07 (×4): 40 mg via ORAL
  Filled 2019-07-02 (×4): qty 2

## 2019-07-02 MED ORDER — ADULT MULTIVITAMIN W/MINERALS CH
1.0000 | ORAL_TABLET | Freq: Every day | ORAL | Status: DC
  Administered 2019-07-03 – 2019-07-08 (×4): 1 via ORAL
  Filled 2019-07-02 (×5): qty 1

## 2019-07-02 MED ORDER — AMIODARONE HCL 200 MG PO TABS
200.0000 mg | ORAL_TABLET | Freq: Two times a day (BID) | ORAL | Status: DC
  Administered 2019-07-02 – 2019-07-08 (×10): 200 mg via ORAL
  Filled 2019-07-02 (×12): qty 1

## 2019-07-02 MED ORDER — NITROGLYCERIN 0.4 MG SL SUBL: 0.4000 mg | SUBLINGUAL_TABLET | SUBLINGUAL | Status: DC | PRN

## 2019-07-02 MED ORDER — POTASSIUM CHLORIDE CRYS ER 20 MEQ PO TBCR
20.0000 meq | EXTENDED_RELEASE_TABLET | Freq: Two times a day (BID) | ORAL | Status: DC
  Administered 2019-07-02 – 2019-07-03 (×4): 20 meq via ORAL
  Filled 2019-07-02 (×4): qty 1

## 2019-07-02 MED ORDER — SODIUM CHLORIDE 0.9% FLUSH
3.0000 mL | Freq: Two times a day (BID) | INTRAVENOUS | Status: DC
  Administered 2019-07-02 – 2019-07-08 (×13): 3 mL via INTRAVENOUS

## 2019-07-02 MED ORDER — CLOPIDOGREL BISULFATE 75 MG PO TABS
75.0000 mg | ORAL_TABLET | Freq: Every day | ORAL | Status: DC
  Administered 2019-07-02 – 2019-07-07 (×4): 75 mg via ORAL
  Filled 2019-07-02 (×5): qty 1

## 2019-07-02 MED ORDER — ENOXAPARIN SODIUM 40 MG/0.4ML ~~LOC~~ SOLN
40.0000 mg | SUBCUTANEOUS | Status: DC
  Administered 2019-07-02 – 2019-07-05 (×4): 40 mg via SUBCUTANEOUS
  Filled 2019-07-02 (×4): qty 0.4

## 2019-07-02 MED ORDER — SENNOSIDES-DOCUSATE SODIUM 8.6-50 MG PO TABS
1.0000 | ORAL_TABLET | Freq: Every day | ORAL | Status: DC
  Administered 2019-07-02 – 2019-07-08 (×5): 1 via ORAL
  Filled 2019-07-02 (×6): qty 1

## 2019-07-02 MED ORDER — TIOTROPIUM BROMIDE MONOHYDRATE 18 MCG IN CAPS
1.0000 | ORAL_CAPSULE | Freq: Every day | RESPIRATORY_TRACT | Status: DC
  Administered 2019-07-02 – 2019-07-04 (×3): 18 ug via RESPIRATORY_TRACT
  Filled 2019-07-02: qty 5

## 2019-07-02 MED ORDER — CYCLOBENZAPRINE HCL 10 MG PO TABS
10.0000 mg | ORAL_TABLET | Freq: Every evening | ORAL | Status: DC | PRN
  Administered 2019-07-04: 10 mg via ORAL
  Filled 2019-07-02 (×2): qty 1

## 2019-07-02 MED ORDER — SODIUM CHLORIDE 0.9% FLUSH: 3.0000 mL | INTRAVENOUS | Status: DC | PRN

## 2019-07-02 MED ORDER — GUAIFENESIN 100 MG/5ML PO SOLN
5.0000 mL | Freq: Four times a day (QID) | ORAL | Status: DC | PRN
  Administered 2019-07-02 – 2019-07-07 (×7): 100 mg via ORAL
  Filled 2019-07-02 (×9): qty 5

## 2019-07-02 MED ORDER — CARVEDILOL 3.125 MG PO TABS
3.1250 mg | ORAL_TABLET | Freq: Two times a day (BID) | ORAL | Status: DC
  Administered 2019-07-02 – 2019-07-06 (×5): 3.125 mg via ORAL
  Filled 2019-07-02 (×7): qty 1

## 2019-07-02 NOTE — ED Notes (Signed)
Pt alert, watching tv, resting comfortably in bed. Rail up. Bed locked low. Call bell within reach.

## 2019-07-02 NOTE — ED Notes (Signed)
Pt sleeping upon entrance to room.

## 2019-07-02 NOTE — ED Notes (Addendum)
Verbal from admitting provider at bedside to hold on 250cc NS bolus; will follow any new orders as placed. Placing IV now. Pt denies dizziness, nausea, weakness, etc. Pt alert and talking to this RN and provider. Verbal from admitting provider to hold lasix for now.

## 2019-07-02 NOTE — ED Notes (Signed)
ED TO INPATIENT HANDOFF REPORT  ED Nurse Name and Phone #: Metta Clines [phone]  S Name/Age/Gender Stephen [name] 53 y.o. male Room/Bed: ED02A/ED02A  Code Status   Code Status: Full Code  Home/SNF/Other Home Patient oriented to: self, place, time and situation Is this baseline? Yes   Triage Complete: Triage complete  Chief Complaint EMS Leg edema  Triage Note First RN Note: Pt presents to ED via [facility] with c/o BLE edema with SOB on exertion. Per EMS pt was at F/U doctors up from D/C from hospital for same.   77HR 99% RA 97.5 Temp 94/68  Pt arrived to ed via ems from primary doctors office for lower extremity swelling and SOB. Initial BP in Left arm BP 88/59. Pt states lower leg "swelling starting 2 weeks ago when I was admitted for fluid around my lungs", pneumonia.  Pt states he has gained 4lbs in last 24 hours per his scale at home. Left knee visibly swollen compared to left, 2+ edema bilateral lower legs. Pt takes medication (torsemide) for fluid retention x 3 years. Pt does not appear to be in acute distress at this time. RR 18, even unlabored breaths.   Allergies Allergies  Allergen Reactions  . Lisinopril Cough    Level of Care/Admitting Diagnosis ED Disposition    ED Disposition Condition [facility]: [place] [[id number]]  Level of Care: Telemetry [5]  Covid Evaluation: Screening Protocol (No Symptoms)  Diagnosis: Acute exacerbation of CHF (congestive heart failure) [facility] [[id number]]  Admitting Physician: Mayer Camel [[id number]]  Attending Physician: Mayer Camel [[id number]]  Estimated length of stay: past midnight tomorrow  Certification:: I certify this patient will need inpatient services for at least 2 midnights  PT Class (Do Not Modify): Inpatient [101]  PT Acc Code (Do Not Modify): Private [1]       B Medical/Surgery History Past Medical History:  Diagnosis Date  . AICD (automatic  cardioverter/defibrillator) present   . AKI (acute kidney injury) ([place]) 12/24/2017  . Arrhythmia   . Arthritis    "lower back, knees" (06/10/2018)  . Cardiac arrest ([place]) 12/23/2017   Brief V-fib arrest  . Chest pain 09/08/2017  . CHF (congestive heart failure) ([facility])    nonischemic cardiomyopathy, EF 25%  . Gout    "on daily RX" (06/10/2018)  . Headache    "q couple months" (06/10/2018)  . High cholesterol   . History of kidney stones   . Hypertension   . Influenza A 12/24/2017  . NICM (nonischemic cardiomyopathy) ([place])   . Pneumonia 12/20/2017  . TIA (transient ischemic attack) 06/21/2016   "still affects my memory a little bit" (06/10/2018)   Past Surgical History:  Procedure Laterality Date  . EXTERNAL FIXATION LEG Right ~ 2000   "was going bowlegged; had to brake my leg to fix it"  . HERNIA REPAIR    . ICD IMPLANT N/A 12/30/2017   Procedure: ICD IMPLANT;  Surgeon: Deboraha Sprang, MD;  Location: [place] [facility];  Service: Cardiovascular;  Laterality: N/A;  . LEFT HEART CATH AND CORONARY ANGIOGRAPHY N/A 09/09/2017   Procedure: LEFT HEART CATH AND CORONARY ANGIOGRAPHY;  Surgeon: Teodoro Spray, MD;  Location: [place] [facility];  Service: Cardiovascular;  Laterality: N/A;  . UMBILICAL HERNIA REPAIR  1990s  . V TACH ABLATION  06/10/2018  . V TACH ABLATION N/A 06/10/2018   Procedure: V TACH ABLATION;  Surgeon: Thompson Grayer, MD;  Location: [place] [facility];  Service: Cardiovascular;  Laterality: N/A;  . VASECTOMY       A IV Location/Drains/Wounds Patient Lines/Drains/Airways Status   Active Line/Drains/Airways    Name:   Placement date:   Placement time:   Site:   Days:   Peripheral IV 07/02/19 Right Antecubital   07/02/19    0135    Antecubital   less than 1          Intake/Output Last 24 hours  Intake/Output Summary (Last 24 hours) at 07/02/2019 0302 Last data filed at 07/01/2019 2315 Gross per 24 hour  Intake 240 ml  Output -  Net 240 ml     Labs/Imaging Results for orders placed or performed during the hospital encounter of 07/01/19 (from the past 48 hour(s))  Basic metabolic panel     Status: Abnormal   Collection Time: 07/01/19  5:10 PM  Result Value Ref Range   Sodium 136 135 - 145 mmol/L   Potassium 4.3 3.5 - 5.1 mmol/L   Chloride 95 (L) 98 - 111 mmol/L   CO2 31 22 - 32 mmol/L   Glucose, Bld 98 70 - 99 mg/dL   BUN 41 (H) 6 - 20 mg/dL   Creatinine, Ser 1.64 (H) 0.61 - 1.24 mg/dL   Calcium 9.2 8.9 - 10.3 mg/dL   GFR calc non Af Amer 47 (L) >60 mL/min   GFR calc Af Amer 54 (L) >60 mL/min   Anion gap 10 5 - 15    Comment: Performed at [facility], [address] [place], [place] [address]  CBC     Status: Abnormal   Collection Time: 07/01/19  5:10 PM  Result Value Ref Range   WBC 13.7 (H) 4.0 - 10.5 K/uL   RBC 4.26 4.22 - 5.81 MIL/uL   Hemoglobin 12.0 (L) 13.0 - 17.0 g/dL   HCT 37.0 (L) 39.0 - 52.0 %   MCV 86.9 80.0 - 100.0 fL   MCH 28.2 26.0 - 34.0 pg   MCHC 32.4 30.0 - 36.0 g/dL   RDW 17.2 (H) 11.5 - 15.5 %   Platelets 547 (H) 150 - 400 K/uL   nRBC 0.0 0.0 - 0.2 %    Comment: Performed at [facility], [address] [place], [place] [address]  Brain natriuretic peptide     Status: Abnormal   Collection Time: 07/01/19  5:10 PM  Result Value Ref Range   B Natriuretic Peptide 1,793.0 (H) 0.0 - 100.0 pg/mL    Comment: Performed at [facility], [address] [place], [place] [address]  Hepatic function panel     Status: Abnormal   Collection Time: 07/01/19  5:10 PM  Result Value Ref Range   Total Protein 7.4 6.5 - 8.1 g/dL   Albumin 2.7 (L) 3.5 - 5.0 g/dL   AST 29 15 - 41 U/L   ALT 55 (H) 0 - 44 U/L   Alkaline Phosphatase 109 38 - 126 U/L   Total Bilirubin 1.4 (H) 0.3 - 1.2 mg/dL   Bilirubin, Direct 0.4 (H) 0.0 - 0.2 mg/dL   Indirect Bilirubin 1.0 (H) 0.3 - 0.9 mg/dL    Comment: Performed at [facility], [place], [place] [address]   Troponin I (High Sensitivity)     Status: Abnormal   Collection Time: 07/01/19  5:10 PM  Result Value Ref Range   Troponin I (High Sensitivity) 27 (H) <18 ng/L    Comment: (NOTE) Elevated high sensitivity troponin I (hsTnI)  values and significant  changes across serial measurements may suggest ACS but many other  chronic and acute conditions are known to elevate hsTnI results.  Refer to the Links section for chest pain algorithms and additional  guidance. Performed at [facility], [address], [place], [place] [address]   SARS Coronavirus 2 (CEPHEID- Performed in [facility] hospital lab), Hosp Order     Status: None   Collection Time: 07/01/19 10:54 PM   Specimen: Nasopharyngeal Swab  Result Value Ref Range   SARS Coronavirus 2 NEGATIVE NEGATIVE    Comment: (NOTE) If result is NEGATIVE SARS-CoV-2 target nucleic acids are NOT DETECTED. The SARS-CoV-2 RNA is generally detectable in upper and lower  respiratory specimens during the acute phase of infection. The lowest  concentration of SARS-CoV-2 viral copies this assay can detect is 250  copies / mL. A negative result does not preclude SARS-CoV-2 infection  and should not be used as the sole basis for treatment or other  patient management decisions.  A negative result may occur with  improper specimen collection / handling, submission of specimen other  than nasopharyngeal swab, presence of viral mutation(s) within the  areas targeted by this assay, and inadequate number of viral copies  (<250 copies / mL). A negative result must be combined with clinical  observations, patient history, and epidemiological information. If result is POSITIVE SARS-CoV-2 target nucleic acids are DETECTED. The SARS-CoV-2 RNA is generally detectable in upper and lower  respiratory specimens dur ing the acute phase of infection.  Positive  results are indicative of active infection with SARS-CoV-2.  Clinical  correlation with patient  history and other diagnostic information is  necessary to determine patient infection status.  Positive results do  not rule out bacterial infection or co-infection with other viruses. If result is PRESUMPTIVE POSTIVE SARS-CoV-2 nucleic acids MAY BE PRESENT.   A presumptive positive result was obtained on the submitted specimen  and confirmed on repeat testing.  While 2019 novel coronavirus  (SARS-CoV-2) nucleic acids may be present in the submitted sample  additional confirmatory testing may be necessary for epidemiological  and / or clinical management purposes  to differentiate between  SARS-CoV-2 and other Sarbecovirus currently known to infect humans.  If clinically indicated additional testing with an alternate test  methodology [phone]) is advised. The SARS-CoV-2 RNA is generally  detectable in upper and lower respiratory sp ecimens during the acute  phase of infection. The expected result is Negative. Fact Sheet for Patients:  StrictlyIdeas.no Fact Sheet for Healthcare Providers: BankingDealers.co.za This test is not yet approved or cleared by the [place] FDA and has been authorized for detection and/or diagnosis of SARS-CoV-2 by FDA under an Emergency Use Authorization (EUA).  This EUA will remain in effect (meaning this test can be used) for the duration of the COVID-19 declaration under Section 564(b)(1) of the Act, 21 U.S.C. section 360bbb-3(b)(1), unless the authorization is terminated or revoked sooner. Performed at [facility], [address] [place], [place] [address]   Synovial cell count + diff, w/ crystals     Status: Abnormal   Collection Time: 07/01/19 11:52 PM  Result Value Ref Range   Color, Synovial YELLOW YELLOW   Appearance-Synovial TURBID (A) CLEAR   Crystals, Fluid EXTRACELLULAR MONOSODIUM URATE CRYSTALS    WBC, Synovial 40,521 (H) 0 - 200 /cu mm   Neutrophil, Synovial 83 %    Lymphocytes-Synovial Fld 0 %   Monocyte-Macrophage-Synovial Fluid 17 %   Eosinophils-Synovial 0 %  Comment: Performed at [facility], [address] [place], [place] [address]   Dg Chest 1 View  Result Date: 07/01/2019 CLINICAL DATA:  Shortness of breath, lower extremity swelling. EXAM: CHEST  1 VIEW COMPARISON:  06/23/2019 FINDINGS: Cardiomegaly with vascular congestion and mild bilateral pulmonary opacities, likely edema. Findings are similar to prior study. No visible effusions. No acute bony abnormality. IMPRESSION: Cardiomegaly with vascular congestion and diffuse bilateral airspace opacities, likely edema/CHF. Findings similar to prior study. Electronically Signed   By: Rolm Baptise M.D.   On: 07/01/2019 22:39   Dg Knee 2 Views Left  Result Date: 07/02/2019 CLINICAL DATA:  Knee pain, swelling EXAM: LEFT KNEE - 1-2 VIEW COMPARISON:  10/17/2018 FINDINGS: Old healed proximal tibial fracture with deformity. Advanced tricompartment degenerative changes most pronounced in the medial compartment. Moderate joint effusion. No acute fracture, subluxation or dislocation. Findings are similar prior study. IMPRESSION: Remote posttraumatic deformity of the proximal tibia. Tricompartment degenerative changes with moderate joint effusion. No acute bony abnormality. Electronically Signed   By: Rolm Baptise M.D.   On: 07/02/2019 00:20   US Venous Img Lower Unilateral Left  Result Date: 07/02/2019 CLINICAL DATA:  Left leg swelling EXAM: LEFT LOWER EXTREMITY VENOUS DOPPLER ULTRASOUND TECHNIQUE: Gray-scale sonography with graded compression, as well as color Doppler and duplex ultrasound were performed to evaluate the lower extremity deep venous systems from the level of the common femoral vein and including the common femoral, femoral, profunda femoral, popliteal and calf veins including the posterior tibial, peroneal and gastrocnemius veins when visible. The superficial great saphenous vein was also  interrogated. Spectral Doppler was utilized to evaluate flow at rest and with distal augmentation maneuvers in the common femoral, femoral and popliteal veins. COMPARISON:  None. FINDINGS: Contralateral Common Femoral Vein: Respiratory phasicity is normal and symmetric with the symptomatic side. No evidence of thrombus. Normal compressibility. Common Femoral Vein: No evidence of thrombus. Normal compressibility, respiratory phasicity and response to augmentation. Saphenofemoral Junction: No evidence of thrombus. Normal compressibility and flow on color Doppler imaging. Profunda Femoral Vein: No evidence of thrombus. Normal compressibility and flow on color Doppler imaging. Femoral Vein: No evidence of thrombus. Normal compressibility, respiratory phasicity and response to augmentation. Popliteal Vein: No evidence of thrombus. Normal compressibility, respiratory phasicity and response to augmentation. Calf Veins: No evidence of thrombus. Normal compressibility and flow on color Doppler imaging. Superficial Great Saphenous Vein: No evidence of thrombus. Normal compressibility. Venous Reflux:  None. Other Findings:  None. IMPRESSION: No evidence of deep venous thrombosis. Electronically Signed   By: Rolm Baptise M.D.   On: 07/02/2019 01:06    Pending Labs Unresulted Labs (From admission, onward)    Start     Ordered   07/09/19 0500  Creatinine, serum  (enoxaparin (LOVENOX)    CrCl >/= 30 ml/min)  Weekly,   STAT    Comments: while on enoxaparin therapy    07/02/19 0239   07/03/19 0272  Basic metabolic panel  Daily,   STAT     07/02/19 0239   07/02/19 0240  CBC  (enoxaparin (LOVENOX)    CrCl >/= 30 ml/min)  Once,   STAT    Comments: Baseline for enoxaparin therapy IF NOT ALREADY DRAWN.  Notify MD if PLT < 100 K.    07/02/19 0239   07/02/19 0240  Creatinine, serum  (enoxaparin (LOVENOX)    CrCl >/= 30 ml/min)  Once,   STAT    Comments: Baseline for enoxaparin therapy IF NOT ALREADY DRAWN.  07/02/19  0239   07/01/19 2347  Body fluid culture  ONCE - STAT,   STAT     07/01/19 2346          Vitals/Pain Today's Vitals   07/02/19 0222 07/02/19 0226 07/02/19 0234 07/02/19 0242  BP:    99/76  Pulse: 69 74 72   Resp:      Temp:      TempSrc:      SpO2: 95% 98% 96%   Weight:      Height:      PainSc:        Isolation Precautions Airborne and Contact precautions  Medications Medications  furosemide (LASIX) injection 40 mg (0 mg Intravenous Hold 07/02/19 0152)  sodium chloride flush (NS) 0.9 % injection 3 mL (has no administration in time range)  sodium chloride flush (NS) 0.9 % injection 3 mL (has no administration in time range)  0.9 %  sodium chloride infusion (has no administration in time range)  acetaminophen (TYLENOL) tablet 650 mg (has no administration in time range)  ondansetron (ZOFRAN) injection 4 mg (has no administration in time range)  enoxaparin (LOVENOX) injection 40 mg (has no administration in time range)  furosemide (LASIX) injection 40 mg (has no administration in time range)  lidocaine (PF) (XYLOCAINE) 1 % injection 5 mL (5 mLs Intradermal Given 07/01/19 2350)    Mobility walks Low fall risk   Focused Assessments Edema 2+ in legs; knees swollen (especially L); SOB and 4lb inc in wt per pt   R Recommendations: See Admitting Provider Note  Report given to:   Additional Notes:

## 2019-07-02 NOTE — ED Notes (Signed)
This tech handed fluid from knee specimen over to the laboratory tech Daisy.

## 2019-07-02 NOTE — ED Notes (Signed)
Pt given 2 more warm blankets. Room temp inc for pt.

## 2019-07-02 NOTE — Progress Notes (Signed)
Cardiac Rehab Navigator/ Exercise Physiologist Note  This patient is well known to this EP. Patient has attended and completed Cardiac Rehab in the past year. Patient has been admitted into the hospital two times over the past few months while Cardiac Rehab has been closed. This EP provided patient education during his most recent admission. Patient is interested in coming back to Rehab to build strength for his hopeful heart transplant. Patient is still to weak to participate at this time.   This EP called in to patient's room to touch base. Patient is resting and does not want to review education as this time. Patient reports that he has foot pain related to gout for this admission and not a HF exacerbation. This EP will follow-up and keep watch of Physician notes over the next few days and review education as needed.   [name], [facility]  Exercise Physiologist Department Phone #: [phone] Fax: [phone]  Direct Line [phone] Email Address: [email or web address]

## 2019-07-02 NOTE — Progress Notes (Addendum)
Advance care planning  Purpose of Encounter Congestive heart failure  Parties in Attendance Patient  Patients Decisional capacity Alert and oriented.  Able to make medical decisions.   Patient tells me his documented healthcare power of attorney is his sister Stephen [name].  Does not have ACP documents in place.  Discussed regarding patient's acute on chronic systolic congestive heart failure with low ejection fraction of 10%.  He understands he is at increased risk for complications.  Discussed regarding treatment plan, prognosis.  All questions answered.   We discussed regarding his CODE STATUS and he tells me that he would like to be intubated if needed and CPR if necessary.  Has AICD in place.  Wishes to be full code. I did notice patient was partial code in the past.  But presently full code  Orders entered and CODE STATUS changed.  Full code  Time spent-18 minutes

## 2019-07-02 NOTE — ED Notes (Signed)
Attempted for 20g IV at R fa.

## 2019-07-02 NOTE — H&P (Signed)
[facility] NAME: Stephen [name]    MR#:  [id number]  DATE OF BIRTH:  [birth year]  DATE OF ADMISSION:  07/01/2019  PRIMARY CARE PHYSICIAN: Valerie Roys, DO   REQUESTING/REFERRING PHYSICIAN: Merlyn Lot, MD  CHIEF COMPLAINT:   Chief Complaint  Patient presents with  . Leg Swelling  . Shortness of Breath    HISTORY OF PRESENT ILLNESS:  Stephen [name]  is a 53 y.o. male with a known history of CHF with nonischemic cardiomyopathy and ejection fraction 10 to 15%, history of gout, hypertension, hyperlipidemia.  He presented to the emergency room with increased shortness of breath over the last 4 days noting a 5 pound weight gain despite increasing torsemide and decreasing p.o. intake.  He notes edema of bilateral lower extremities however worse surrounding his right knee with increased pain and tenderness localized at that area as well.  He notes no recent falls or injuries.  He denies chest pain.  He notes wheezing at times usually associated with increased exertion as well as cough productive of clear mucus.  He denies having had nausea or vomiting.  He denies diarrhea.  He denies abdominal pain.  He denies fevers or chills.  On arrival BNP is 1007 or 93.  Left lower extremity ultrasound is negative for DVT.  Patient underwent right knee aspiration and reports decreased discomfort since then.  Mild leukocytosis with WBC 13.7.  Rapid COVID testing is negative he has been admitted to the hospital service for further management.  PAST MEDICAL HISTORY:   Past Medical History:  Diagnosis Date  . AICD (automatic cardioverter/defibrillator) present   . AKI (acute kidney injury) ([facility]) 12/24/2017  . Arrhythmia   . Arthritis    "lower back, knees" (06/10/2018)  . Cardiac arrest ([facility]) 12/23/2017   Brief V-fib arrest  . Chest pain 09/08/2017  . CHF (congestive heart failure) ([facility])    nonischemic cardiomyopathy, EF 25%  . Gout    "on  daily RX" (06/10/2018)  . Headache    "q couple months" (06/10/2018)  . High cholesterol   . History of kidney stones   . Hypertension   . Influenza A 12/24/2017  . NICM (nonischemic cardiomyopathy) ([facility])   . Pneumonia 12/20/2017  . TIA (transient ischemic attack) 06/21/2016   "still affects my memory a little bit" (06/10/2018)    PAST SURGICAL HISTORY:   Past Surgical History:  Procedure Laterality Date  . EXTERNAL FIXATION LEG Right ~ 2000   "was going bowlegged; had to brake my leg to fix it"  . HERNIA REPAIR    . ICD IMPLANT N/A 12/30/2017   Procedure: ICD IMPLANT;  Surgeon: Deboraha Sprang, MD;  Location: [facility];  Service: Cardiovascular;  Laterality: N/A;  . LEFT HEART CATH AND CORONARY ANGIOGRAPHY N/A 09/09/2017   Procedure: LEFT HEART CATH AND CORONARY ANGIOGRAPHY;  Surgeon: Teodoro Spray, MD;  Location: [facility];  Service: Cardiovascular;  Laterality: N/A;  . UMBILICAL HERNIA REPAIR  1990s  . V TACH ABLATION  06/10/2018  . V TACH ABLATION N/A 06/10/2018   Procedure: V TACH ABLATION;  Surgeon: Thompson Grayer, MD;  Location: [facility];  Service: Cardiovascular;  Laterality: N/A;  . VASECTOMY      SOCIAL HISTORY:   Social History   Tobacco Use  . Smoking status: Former Smoker    Packs/day: 0.33    Years: 33.00    Pack years: 10.89  Types: Cigarettes    Quit date: 02/20/2016    Years since quitting: 3.3  . Smokeless tobacco: Never Used  Substance Use Topics  . Alcohol use: Not Currently    Alcohol/week: 0.0 standard drinks    Comment: 06/10/2018 "beer once/month; if that"    FAMILY HISTORY:   Family History  Problem Relation Age of Onset  . Hypertension Mother   . Heart failure Mother   . Hypertension Father   . CAD Father   . Heart attack Father     DRUG ALLERGIES:   Allergies  Allergen Reactions  . Lisinopril Cough    REVIEW OF SYSTEMS:   Review of Systems  Constitutional: Positive for malaise/fatigue. Negative  for chills, diaphoresis, fever and weight loss.  HENT: Positive for congestion. Negative for sinus pain.   Eyes: Negative for blurred vision and double vision.  Respiratory: Positive for cough and shortness of breath. Negative for hemoptysis, sputum production and wheezing.   Cardiovascular: Positive for orthopnea and leg swelling. Negative for chest pain and palpitations.  Gastrointestinal: Positive for nausea. Negative for abdominal pain, blood in stool, constipation, diarrhea, heartburn, melena and vomiting.  Genitourinary: Negative for dysuria, flank pain and hematuria.  Musculoskeletal: Positive for joint pain (Left knee) and myalgias. Negative for falls.  Skin: Negative for itching and rash.  Neurological: Positive for weakness (general). Negative for dizziness and headaches.  Psychiatric/Behavioral: Negative.  Negative for depression.     MEDICATIONS AT HOME:   Prior to Admission medications   Medication Sig Start Date End Date Taking? Authorizing Provider  acetaminophen (TYLENOL) 325 MG tablet Take 2 tablets (650 mg total) by mouth every 6 (six) hours as needed. Patient taking differently: Take 325-650 mg by mouth.  03/19/18  Yes Carrie Mew, MD  albuterol (PROVENTIL HFA;VENTOLIN HFA) 108 (90 Base) MCG/ACT inhaler Inhale 2 puffs into the lungs every 4 (four) hours as needed for wheezing or shortness of breath. 09/19/18  Yes Johnson, Megan P, DO  allopurinol (ZYLOPRIM) 100 MG tablet Take 2 tablets (200 mg total) by mouth daily. 10/19/18  Yes Johnson, Megan P, DO  amiodarone (PACERONE) 200 MG tablet Take 200 mg by mouth 2 (two) times daily.   Yes [provider]  aspirin 81 MG chewable tablet Chew 81 mg by mouth daily.   Yes [provider]  atorvastatin (LIPITOR) 40 MG tablet Take 1 tablet (40 mg total) by mouth daily at 6 PM. 04/22/19  Yes Hackney, Otila Kluver A, FNP  carvedilol (COREG) 3.125 MG tablet Take 3.125 mg by mouth 2 (two) times daily with a meal.   Yes  [provider]  clopidogrel (PLAVIX) 75 MG tablet TAKE ONE TABLET BY MOUTH EVERY DAY Patient taking differently: Take 75 mg by mouth daily. At [facility] 02/13/19  Yes Bensimhon, Shaune Pascal, MD  colchicine 0.6 MG tablet Take 1 tablet (0.6 mg total) by mouth daily as needed (gout flare). 09/03/18  Yes Johnson, Megan P, DO  cyclobenzaprine (FLEXERIL) 10 MG tablet Take 1 tablet (10 mg total) by mouth at bedtime. Patient taking differently: Take 10 mg by mouth at bedtime as needed for muscle spasms.  05/21/19  Yes Johnson, Megan P, DO  diclofenac sodium (VOLTAREN) 1 % GEL Apply 2 g topically 4 (four) times daily as needed (pain).    Yes [provider]  fluticasone (FLONASE) 50 MCG/ACT nasal spray Place 2 sprays into both nostrils daily. Patient taking differently: Place 2 sprays into both nostrils daily as needed for allergies.  06/20/17  Yes [name], MD  fluticasone-salmeterol (ADVAIR HFA) [phone] MCG/ACT inhaler Inhale 2 puffs into the lungs 2 (two) times daily. 08/12/18  Yes [name], MD  Magnesium 400 MG TABS Take 1 tablet by mouth 2 (two) times daily. 04/22/19  Yes [name], FNP  Multiple Vitamin (MULTIVITAMIN WITH MINERALS) TABS tablet Take 1 tablet by mouth daily.   Yes [provider]  nitroGLYCERIN (NITROSTAT) 0.4 MG SL tablet Place 1 tablet (0.4 mg total) under the tongue every 5 (five) minutes as needed for chest pain. 04/22/19  Yes Hackney, Tina A, FNP  polyethylene glycol (MIRALAX / GLYCOLAX) 17 g packet Take 17 g by mouth daily. 04/20/19  Yes Mody, Ulice Bold, MD  potassium chloride SA (K-DUR) 20 MEQ tablet Take 1 tablet (20 mEq total) by mouth 2 (two) times daily. 06/26/19  Yes Fritzi Mandes, MD  sennosides-docusate sodium (SENOKOT-S) 8.6-50 MG tablet Take 1 tablet by mouth daily.   Yes [provider]  sodium chloride (OCEAN) 0.65 % SOLN nasal spray Place 1 spray into both nostrils as needed for congestion.   Yes [provider]  spironolactone  (ALDACTONE) 25 MG tablet Take 0.5 tablets (12.5 mg total) by mouth daily. 04/15/19  Yes Vaughan Basta, MD  Tiotropium Bromide Monohydrate (SPIRIVA RESPIMAT) 2.5 MCG/ACT AERS Inhale 1 puff into the lungs daily. 08/12/18  Yes [name], MD  torsemide (DEMADEX) 20 MG tablet Take 1 tablet (20 mg total) by mouth 2 (two) times daily. 06/26/19  Yes Fritzi Mandes, MD  Nutritional Supplements (FEEDING SUPPLEMENT, NEPRO CARB STEADY,) LIQD Take 237 mLs by mouth 2 (two) times daily between meals. 06/26/19   Fritzi Mandes, MD      VITAL SIGNS:  Blood pressure 101/82, pulse 78, temperature 97.6 F (36.4 C), temperature source Oral, resp. rate 20, height 6\' 1"  (1.854 m), weight 96.3 kg, SpO2 100 %.  PHYSICAL EXAMINATION:  Physical Exam  GENERAL:  53 y.o.-year-old patient lying in the bed with no acute distress.  EYES: Pupils equal, round, reactive to light and accommodation. No scleral icterus. Extraocular muscles intact.  HEENT: Head atraumatic, normocephalic. Oropharynx and nasopharynx clear.  NECK:  Supple, no jugular venous distention. No thyroid enlargement, no tenderness.  LUNGS: Faint rales to bilateral lower lobes slightly diminished in the bases. No use of accessory muscles of respiration.  CARDIOVASCULAR: Regular rate and rhythm, S1, S2 normal. No murmurs, rubs, or gallops.  ABDOMEN: Soft, nondistended, nontender. Bowel sounds present. No organomegaly or mass.  EXTREMITIES: No pedal edema, cyanosis, or clubbing. Left knee tenderness and edema  NEUROLOGIC: Cranial nerves II through XII are intact. Muscle strength 5/5 in all extremities. Sensation intact. Gait not checked.  PSYCHIATRIC: The patient is alert and oriented x 3.  Normal affect and good eye contact. SKIN: No obvious rash, lesion, or ulcer.   LABORATORY PANEL:   CBC Recent Labs  Lab 07/01/19 1710  WBC 13.7*  HGB 12.0*  HCT 37.0*  PLT 547*    ------------------------------------------------------------------------------------------------------------------  Chemistries  Recent Labs  Lab 07/01/19 1710  NA 136  K 4.3  CL 95*  CO2 31  GLUCOSE 98  BUN 41*  CREATININE 1.64*  CALCIUM 9.2  AST 29  ALT 55*  ALKPHOS 109  BILITOT 1.4*   ------------------------------------------------------------------------------------------------------------------  Cardiac Enzymes No results for input(s): TROPONINI in the last 168 hours. ------------------------------------------------------------------------------------------------------------------  RADIOLOGY:  Dg Chest 1 View  Result Date: 07/01/2019 CLINICAL DATA:  Shortness of breath, lower extremity swelling. EXAM: CHEST  1 VIEW COMPARISON:  06/23/2019  FINDINGS: Cardiomegaly with vascular congestion and mild bilateral pulmonary opacities, likely edema. Findings are similar to prior study. No visible effusions. No acute bony abnormality. IMPRESSION: Cardiomegaly with vascular congestion and diffuse bilateral airspace opacities, likely edema/CHF. Findings similar to prior study. Electronically Signed   By: Rolm Baptise M.D.   On: 07/01/2019 22:39   Dg Knee 2 Views Left  Result Date: 07/02/2019 CLINICAL DATA:  Knee pain, swelling EXAM: LEFT KNEE - 1-2 VIEW COMPARISON:  10/17/2018 FINDINGS: Old healed proximal tibial fracture with deformity. Advanced tricompartment degenerative changes most pronounced in the medial compartment. Moderate joint effusion. No acute fracture, subluxation or dislocation. Findings are similar prior study. IMPRESSION: Remote posttraumatic deformity of the proximal tibia. Tricompartment degenerative changes with moderate joint effusion. No acute bony abnormality. Electronically Signed   By: Rolm Baptise M.D.   On: 07/02/2019 00:20   US Venous Img Lower Unilateral Left  Result Date: 07/02/2019 CLINICAL DATA:  Left leg swelling EXAM: LEFT LOWER EXTREMITY VENOUS DOPPLER  ULTRASOUND TECHNIQUE: Gray-scale sonography with graded compression, as well as color Doppler and duplex ultrasound were performed to evaluate the lower extremity deep venous systems from the level of the common femoral vein and including the common femoral, femoral, profunda femoral, popliteal and calf veins including the posterior tibial, peroneal and gastrocnemius veins when visible. The superficial great saphenous vein was also interrogated. Spectral Doppler was utilized to evaluate flow at rest and with distal augmentation maneuvers in the common femoral, femoral and popliteal veins. COMPARISON:  None. FINDINGS: Contralateral Common Femoral Vein: Respiratory phasicity is normal and symmetric with the symptomatic side. No evidence of thrombus. Normal compressibility. Common Femoral Vein: No evidence of thrombus. Normal compressibility, respiratory phasicity and response to augmentation. Saphenofemoral Junction: No evidence of thrombus. Normal compressibility and flow on color Doppler imaging. Profunda Femoral Vein: No evidence of thrombus. Normal compressibility and flow on color Doppler imaging. Femoral Vein: No evidence of thrombus. Normal compressibility, respiratory phasicity and response to augmentation. Popliteal Vein: No evidence of thrombus. Normal compressibility, respiratory phasicity and response to augmentation. Calf Veins: No evidence of thrombus. Normal compressibility and flow on color Doppler imaging. Superficial Great Saphenous Vein: No evidence of thrombus. Normal compressibility. Venous Reflux:  None. Other Findings:  None. IMPRESSION: No evidence of deep venous thrombosis. Electronically Signed   By: Rolm Baptise M.D.   On: 07/02/2019 01:06      IMPRESSION AND PLAN:   1. acute exacerbation systolic CHF - BNP 3716 - Most recent echocardiogram with ejection fraction 10 to 15%.  Will repeat echocardiogram and consult cardiology for further evaluation and recommendations -IV Lasix 40 mg  twice daily - Will monitor renal function closely during diuresis - We will repeat a BMP and CBC in the a.m. -Telemetry monitoring -Beta-blocker restarted  2.  Left knee effusion --Status post left arthrocentesis in the emergency room with some immediate pain relief -We will consult physical therapy for supportive care - Fluid sent for synovial fluid testing  3.  Generalized weakness with deconditioning - Physical therapy consulted for supportive care  4.  Hypotension - Will maintain map greater than 65 - Patient is awake, alert, and asymptomatic -Cardiology has been consulted  Telemetry monitoring DVT and PPI  prophylaxis have been initiated     All the records are reviewed and case discussed with ED provider. The plan of care was discussed in details with the patient (and family). I answered all questions. The patient agreed to proceed with the above mentioned plan. Further  management will depend upon hospital course.   CODE STATUS: Full code  TOTAL TIME TAKING CARE OF THIS PATIENT: 75minutes.    [place] on 07/02/2019 at 4:41 AM  Pager - [phone]  After 6pm go to [email or web address] - Proofreader  [facility]  Office  [phone]  CC: Primary care physician; Valerie Roys, DO   Note: This dictation was prepared with Dragon dictation along with smaller phrase technology. Any transcriptional errors that result from this process are unintentional.

## 2019-07-02 NOTE — Plan of Care (Signed)
  Problem: Safety: Goal: Ability to remain free from injury will improve Outcome: Completed/Met

## 2019-07-02 NOTE — Plan of Care (Signed)
  Problem: Education: Goal: Knowledge of General Education information will improve Description: Including pain rating scale, medication(s)/side effects and non-pharmacologic comfort measures Outcome: Completed/Met   Problem: Nutrition: Goal: Adequate nutrition will be maintained Outcome: Completed/Met   Problem: Safety: Goal: Ability to remain free from injury will improve Outcome: Completed/Met

## 2019-07-02 NOTE — Progress Notes (Addendum)
Initial Nutrition Assessment  DOCUMENTATION CODES:   Not applicable  INTERVENTION:   Nepro Shake po BID, each supplement provides 425 kcal and 19 grams protein  MVI daily   2 gram sodium diet   Pt provided with low sodium diet education during last admit ~ 1 week ago  NUTRITION DIAGNOSIS:   Inadequate oral intake related to acute illness as evidenced by per patient/family report.  GOAL:   Patient will meet greater than or equal to 90% of their needs  MONITOR:   PO intake, Supplement acceptance, Labs, Weight trends, Skin, I & O's  REASON FOR ASSESSMENT:   Malnutrition Screening Tool    ASSESSMENT:   53 year old male with PMHx of HTN, hx TIA, CHF, s/p placement of AICD, COPD, CKD III, arthritis, gout admitted with acute on chronic systolic CHF.  RD working remotely.  RD familiar with this pt from multiple recent admits. Pt with fairly good appetite and oral intake when he is feeling well. Pt reports poor appetite and oral intake for a few days pta r/t SOB. Pt was eating 100% of meals prior to his last discharge ~ 1 week ago. RD will add supplements to help pt meet his estimated needs; pt does not like Ensure, will try Nepro. Per chart, pt is fairly weight stable pta; pt does have some weight gain in setting of edema. Will change pt to a 2 gram sodium diet.    Medications reviewed and include: allopurinol, aspirin, plavix, lovenox, lasix, Mg oxide, KCl, senokot  Labs reviewed: BUN 41(H), creat 1.69(H) BNP- 1793(H)-7/1 WBC- 12.3(H)  Unable to complete Nutrition-Focused physical exam at this time.   Diet Order:   Diet Order            Diet renal/carb modified with fluid restriction Diet-HS Snack? Nothing; Fluid restriction: 1200 mL Fluid; Room service appropriate? Yes; Fluid consistency: Thin  Diet effective now             EDUCATION NEEDS:   Education needs have been addressed  Skin:  Skin Assessment: Reviewed RN Assessment(incision knee)  Last BM:   7/1  Height:   Ht Readings from Last 1 Encounters:  07/01/19 6\' 1"  (1.854 m)    Weight:   Wt Readings from Last 1 Encounters:  07/02/19 96.3 kg    Ideal Body Weight:  83.6 kg  BMI:  Body mass index is 28 kg/m.  Estimated Nutritional Needs:   Kcal:  2200-2500kcal/day  Protein:  115-125g/day  Fluid:  2L/day  Koleen Distance MS, RD, LDN Pager #- [phone] Office#- [phone] After Hours Pager: [phone]

## 2019-07-02 NOTE — ED Notes (Addendum)
BP cuff adjusted; BP remains low. Verbal from admitting provider at bedside to give 250cc NS bolus for lowering BP while pt sleeping.

## 2019-07-02 NOTE — ED Notes (Signed)
Admit RN notified that patient desats when asleep, runs hypotensive at baseline. Patient placed on 2L [place], O2 sats in high 90s. Patient states his normal BP systolic is 840; admit RN did not feel safe with patient getting lasix dose before transfer upstairs, so this RN held until they decide

## 2019-07-03 ENCOUNTER — Inpatient Hospital Stay: Payer: PRIVATE HEALTH INSURANCE

## 2019-07-03 LAB — BASIC METABOLIC PANEL
Anion gap: 12 (ref 5–15)
Anion gap: 14 (ref 5–15)
BUN: 39 mg/dL — ABNORMAL HIGH (ref 6–20)
BUN: 48 mg/dL — ABNORMAL HIGH (ref 6–20)
CO2: 25 mmol/L (ref 22–32)
CO2: 24 mmol/L (ref 22–32)
Calcium: 8.9 mg/dL (ref 8.9–10.3)
Calcium: 9.2 mg/dL (ref 8.9–10.3)
Chloride: 100 mmol/L (ref 98–111)
Chloride: 97 mmol/L — ABNORMAL LOW (ref 98–111)
Creatinine, Ser: 1.63 mg/dL — ABNORMAL HIGH (ref 0.61–1.24)
Creatinine, Ser: 2.15 mg/dL — ABNORMAL HIGH (ref 0.61–1.24)
GFR calc Af Amer: 55 mL/min — ABNORMAL LOW (ref >60)
GFR calc Af Amer: 39 mL/min — ABNORMAL LOW (ref >60)
GFR calc non Af Amer: 47 mL/min — ABNORMAL LOW (ref >60)
GFR calc non Af Amer: 34 mL/min — ABNORMAL LOW (ref >60)
Glucose, Bld: 120 mg/dL — ABNORMAL HIGH (ref 70–99)
Glucose, Bld: 142 mg/dL — ABNORMAL HIGH (ref 70–99)
Potassium: 3.9 mmol/L (ref 3.5–5.1)
Potassium: 5.3 mmol/L — ABNORMAL HIGH (ref 3.5–5.1)
Sodium: 137 mmol/L (ref 135–145)
Sodium: 135 mmol/L (ref 135–145)

## 2019-07-03 MED ORDER — SODIUM POLYSTYRENE SULFONATE 15 GM/60ML PO SUSP
15.0000 g | Freq: Once | ORAL | Status: AC
  Administered 2019-07-03: 15 g via ORAL
  Filled 2019-07-03: qty 60

## 2019-07-03 MED ORDER — FUROSEMIDE 10 MG/ML IJ SOLN
8.0000 mg/h | INTRAVENOUS | Status: AC
  Administered 2019-07-03: 8 mg/h via INTRAVENOUS
  Filled 2019-07-03: qty 25

## 2019-07-03 MED ORDER — GABAPENTIN 100 MG PO CAPS
100.0000 mg | ORAL_CAPSULE | Freq: Two times a day (BID) | ORAL | Status: DC
  Administered 2019-07-03 – 2019-07-08 (×9): 100 mg via ORAL
  Filled 2019-07-03 (×10): qty 1

## 2019-07-03 NOTE — Progress Notes (Signed)
[facility] NAME: Stephen [name]    MR#:  [id number]  DATE OF BIRTH:  03-Mar-1966  SUBJECTIVE:  CHIEF COMPLAINT:   Chief Complaint  Patient presents with  . Leg Swelling  . Shortness of Breath   Continues to have shortness of breath, fatigue and pain in bilateral feet. Pain in bilateral feet started after dialysis per patient.  REVIEW OF SYSTEMS:    Review of Systems  Constitutional: Positive for malaise/fatigue. Negative for chills and fever.  HENT: Negative for sore throat.   Eyes: Negative for blurred vision, double vision and pain.  Respiratory: Positive for shortness of breath. Negative for cough, hemoptysis and wheezing.   Cardiovascular: Positive for orthopnea. Negative for chest pain, palpitations and leg swelling.  Gastrointestinal: Negative for abdominal pain, constipation, diarrhea, heartburn, nausea and vomiting.  Genitourinary: Negative for dysuria and hematuria.  Musculoskeletal: Positive for joint pain. Negative for back pain.  Skin: Negative for rash.  Neurological: Negative for sensory change, speech change, focal weakness and headaches.  Endo/Heme/Allergies: Does not bruise/bleed easily.  Psychiatric/Behavioral: Negative for depression. The patient is not nervous/anxious.    DRUG ALLERGIES:   Allergies  Allergen Reactions  . Lisinopril Cough    VITALS:  Blood pressure 94/71, pulse 96, temperature 99.2 F (37.3 C), temperature source Oral, resp. rate 18, height 6\' 1"  (1.854 m), weight 95.9 kg, SpO2 95 %.  PHYSICAL EXAMINATION:   Physical Exam  GENERAL:  53 y.o.-year-old patient lying in the bed with no acute distress.  EYES: Pupils equal, round, reactive to light and accommodation. No scleral icterus. Extraocular muscles intact.  HEENT: Head atraumatic, normocephalic. Oropharynx and nasopharynx clear.  NECK:  Supple, no jugular venous distention. No thyroid enlargement, no tenderness.  LUNGS: Left  basilar crackles. CARDIOVASCULAR: S1, S2 normal. No murmurs, rubs, or gallops.  ABDOMEN: Soft, nontender, nondistended. Bowel sounds present. No organomegaly or mass.  EXTREMITIES: No cyanosis, clubbing or edema b/l.    NEUROLOGIC: Cranial nerves II through XII are intact. No focal Motor or sensory deficits b/l.   PSYCHIATRIC: The patient is alert and oriented x 3.  SKIN: No obvious rash, lesion, or ulcer.   LABORATORY PANEL:   CBC Recent Labs  Lab 07/02/19 0515  WBC 12.3*  HGB 11.1*  HCT 34.2*  PLT 516*   ------------------------------------------------------------------------------------------------------------------ Chemistries  Recent Labs  Lab 07/01/19 1710  07/03/19 0658  NA 136  --  137  K 4.3  --  3.9  CL 95*  --  100  CO2 31  --  25  GLUCOSE 98  --  120*  BUN 41*  --  39*  CREATININE 1.64*   < > 1.63*  CALCIUM 9.2  --  8.9  AST 29  --   --   ALT 55*  --   --   ALKPHOS 109  --   --   BILITOT 1.4*  --   --    < > = values in this interval not displayed.   ------------------------------------------------------------------------------------------------------------------  Cardiac Enzymes No results for input(s): TROPONINI in the last 168 hours. ------------------------------------------------------------------------------------------------------------------  RADIOLOGY:  Dg Chest 1 View  Result Date: 07/01/2019 CLINICAL DATA:  Shortness of breath, lower extremity swelling. EXAM: CHEST  1 VIEW COMPARISON:  06/23/2019 FINDINGS: Cardiomegaly with vascular congestion and mild bilateral pulmonary opacities, likely edema. Findings are similar to prior study. No visible effusions. No acute bony abnormality. IMPRESSION: Cardiomegaly with vascular congestion and diffuse bilateral airspace opacities, likely edema/CHF.  Findings similar to prior study. Electronically Signed   By: Rolm Baptise M.D.   On: 07/01/2019 22:39   Dg Knee 2 Views Left  Result Date: 07/02/2019 CLINICAL  DATA:  Knee pain, swelling EXAM: LEFT KNEE - 1-2 VIEW COMPARISON:  10/17/2018 FINDINGS: Old healed proximal tibial fracture with deformity. Advanced tricompartment degenerative changes most pronounced in the medial compartment. Moderate joint effusion. No acute fracture, subluxation or dislocation. Findings are similar prior study. IMPRESSION: Remote posttraumatic deformity of the proximal tibia. Tricompartment degenerative changes with moderate joint effusion. No acute bony abnormality. Electronically Signed   By: Rolm Baptise M.D.   On: 07/02/2019 00:20   US Venous Img Lower Unilateral Left  Result Date: 07/02/2019 CLINICAL DATA:  Left leg swelling EXAM: LEFT LOWER EXTREMITY VENOUS DOPPLER ULTRASOUND TECHNIQUE: Gray-scale sonography with graded compression, as well as color Doppler and duplex ultrasound were performed to evaluate the lower extremity deep venous systems from the level of the common femoral vein and including the common femoral, femoral, profunda femoral, popliteal and calf veins including the posterior tibial, peroneal and gastrocnemius veins when visible. The superficial great saphenous vein was also interrogated. Spectral Doppler was utilized to evaluate flow at rest and with distal augmentation maneuvers in the common femoral, femoral and popliteal veins. COMPARISON:  None. FINDINGS: Contralateral Common Femoral Vein: Respiratory phasicity is normal and symmetric with the symptomatic side. No evidence of thrombus. Normal compressibility. Common Femoral Vein: No evidence of thrombus. Normal compressibility, respiratory phasicity and response to augmentation. Saphenofemoral Junction: No evidence of thrombus. Normal compressibility and flow on color Doppler imaging. Profunda Femoral Vein: No evidence of thrombus. Normal compressibility and flow on color Doppler imaging. Femoral Vein: No evidence of thrombus. Normal compressibility, respiratory phasicity and response to augmentation. Popliteal  Vein: No evidence of thrombus. Normal compressibility, respiratory phasicity and response to augmentation. Calf Veins: No evidence of thrombus. Normal compressibility and flow on color Doppler imaging. Superficial Great Saphenous Vein: No evidence of thrombus. Normal compressibility. Venous Reflux:  None. Other Findings:  None. IMPRESSION: No evidence of deep venous thrombosis. Electronically Signed   By: Rolm Baptise M.D.   On: 07/02/2019 01:06   Dg Chest Port 1 View  Result Date: 07/03/2019 CLINICAL DATA:  Congestive heart failure. EXAM: PORTABLE CHEST 1 VIEW COMPARISON:  Multiple previous chest x-rays. The most recent is 07/01/2019. FINDINGS: Stable cardiac enlargement and mild tortuosity of the thoracic aorta. There is worsening CHF with marked central vascular congestion and interstitial and alveolar pulmonary edema, more pronounced on the right than left. Suspect left pleural effusion. IMPRESSION: Slight worsening diffuse and slightly asymmetric interstitial and airspace process, likely worsening edema/CHF. Persistent left effusion. Electronically Signed   By: Marijo Sanes M.D.   On: 07/03/2019 09:21     ASSESSMENT AND PLAN:   * Acute on chronic systolic CHF - On IV Lasix 40 mg twice daily For diuresis with IV Lasix boluses.  Also patient had acute kidney injury during recent admission with IV Lasix.  Will start Lasix drip to continue further diuresis.  Monitor BUN and creatinine.  * Left knee effusion --Status post left arthrocentesis in the emergency room with some immediate pain relief Has been a recurrent problem.  Osteoarthritis.  *CKD stage III. Monitor while being diuresed  *Bilateral lower extremity neuropathy.  Will start Neurontin.  * Generalized weakness with deconditioning - Physical therapy consulted for supportive care  Telemetry monitoring DVT and PPI  prophylaxis have been initiated  All the records are reviewed and  case discussed with Care Management/Social  Worker Management plans discussed with the patient, family and they are in agreement.  CODE STATUS: FULL CODE  DVT Prophylaxis: SCDs  TOTAL TIME TAKING CARE OF THIS PATIENT: 35 minutes.   POSSIBLE D/C IN 2-3 DAYS, DEPENDING ON CLINICAL CONDITION.  Leia Alf Bosco Paparella M.D on 07/03/2019 at 12:46 PM  Between 7am to 6pm - Pager - [phone]  After 6pm go to [email or web address] - password [email or web address] [facility]  Office  [phone]  CC: Primary care physician; Valerie Roys, DO  Note: This dictation was prepared with Dragon dictation along with smaller phrase technology. Any transcriptional errors that result from this process are unintentional.

## 2019-07-04 ENCOUNTER — Encounter
Admission: EM | Disposition: A | Discharge status: Other Acute Inpatient Hospital | Payer: Self-pay | Source: Home / Self Care | Attending: Internal Medicine

## 2019-07-04 HISTORY — PX: TEMPORARY DIALYSIS CATHETER: CATH118312

## 2019-07-04 LAB — PHOSPHORUS: Phosphorus: 4.1 mg/dL (ref 2.5–4.6)

## 2019-07-04 LAB — BASIC METABOLIC PANEL
Anion gap: 13 (ref 5–15)
BUN: 52 mg/dL — ABNORMAL HIGH (ref 6–20)
CO2: 26 mmol/L (ref 22–32)
Calcium: 9.1 mg/dL (ref 8.9–10.3)
Chloride: 97 mmol/L — ABNORMAL LOW (ref 98–111)
Creatinine, Ser: 2.4 mg/dL — ABNORMAL HIGH (ref 0.61–1.24)
GFR calc Af Amer: 34 mL/min — ABNORMAL LOW (ref >60)
GFR calc non Af Amer: 29 mL/min — ABNORMAL LOW (ref >60)
Glucose, Bld: 132 mg/dL — ABNORMAL HIGH (ref 70–99)
Potassium: 5.1 mmol/L (ref 3.5–5.1)
Sodium: 136 mmol/L (ref 135–145)

## 2019-07-04 LAB — GLUCOSE, CAPILLARY: Glucose-Capillary: 133 mg/dL — ABNORMAL HIGH (ref 70–99)

## 2019-07-04 SURGERY — TEMPORARY DIALYSIS CATHETER
Anesthesia: Moderate Sedation

## 2019-07-04 MED ORDER — ALTEPLASE 2 MG IJ SOLR
2.0000 mg | Freq: Once | INTRAMUSCULAR | Status: AC | PRN
  Administered 2019-07-06: 2 mg
  Filled 2019-07-04: qty 2

## 2019-07-04 MED ORDER — LIDOCAINE HCL (PF) 1 % IJ SOLN: INTRAMUSCULAR | Status: DC | PRN

## 2019-07-04 MED ORDER — PENTAFLUOROPROP-TETRAFLUOROETH EX AERO
1.0000 "application " | INHALATION_SPRAY | CUTANEOUS | Status: DC | PRN
  Filled 2019-07-04: qty 30

## 2019-07-04 MED ORDER — FUROSEMIDE 10 MG/ML IJ SOLN
8.0000 mg/h | INTRAVENOUS | Status: AC
  Administered 2019-07-04: 11:00:00 8 mg/h via INTRAVENOUS
  Filled 2019-07-04: qty 25

## 2019-07-04 MED ORDER — CHLORHEXIDINE GLUCONATE CLOTH 2 % EX PADS
6.0000 | MEDICATED_PAD | Freq: Every day | CUTANEOUS | Status: DC
  Administered 2019-07-04 – 2019-07-08 (×5): 6 via TOPICAL

## 2019-07-04 MED ORDER — FUROSEMIDE 10 MG/ML IJ SOLN
40.0000 mg | Freq: Once | INTRAMUSCULAR | Status: DC
  Filled 2019-07-04: qty 4

## 2019-07-04 MED ORDER — SODIUM CHLORIDE 0.9 % IV SOLN: 100.0000 mL | INTRAVENOUS | Status: DC | PRN

## 2019-07-04 MED ORDER — MORPHINE SULFATE (PF) 2 MG/ML IV SOLN
INTRAVENOUS | Status: AC
  Filled 2019-07-04: qty 1

## 2019-07-04 MED ORDER — HEPARIN SODIUM (PORCINE) 1000 UNIT/ML DIALYSIS
1000.0000 [IU] | INTRAMUSCULAR | Status: DC | PRN
  Filled 2019-07-04: qty 1

## 2019-07-04 MED ORDER — LIDOCAINE-PRILOCAINE 2.5-2.5 % EX CREA
1.0000 "application " | TOPICAL_CREAM | CUTANEOUS | Status: DC | PRN
  Filled 2019-07-04: qty 5

## 2019-07-04 MED ORDER — FUROSEMIDE 10 MG/ML IJ SOLN
20.0000 mg | Freq: Once | INTRAMUSCULAR | Status: AC
  Administered 2019-07-04: 20 mg via INTRAVENOUS
  Filled 2019-07-04: qty 2

## 2019-07-04 MED ORDER — LIDOCAINE HCL (PF) 1 % IJ SOLN
5.0000 mL | INTRAMUSCULAR | Status: DC | PRN
  Filled 2019-07-04: qty 5

## 2019-07-04 SURGICAL SUPPLY — 2 items
COVER PROBE U/S 5X48 (MISCELLANEOUS) ×3 IMPLANT
KIT DIALYSIS CATH TRI 30X13 (CATHETERS) ×3 IMPLANT

## 2019-07-04 NOTE — Consult Note (Signed)
Cardiology Consultation Note    Patient ID: WM [id number], MRN: [id number], DOB/AGE: [birth year] 53 y.o. Admit date: 07/01/2019   Date of Consult: 07/04/2019 Primary Physician: Valerie Roys, DO Primary Cardiologist: [name]  Chief Complaint: sob Reason for Consultation: Duanne Limerick Requesting MD: Dr. Hillary Bow  HPI: Stephen [name] is a 53 y.o. male with history of nonischemic cardiomyopathy with an ejection fraction of 10-15%, history of ventricular tachycardia with an AICD in place being followed by Dr. Adam Phenix at [facility].He was admitted for progressive sob and volume overload.  His most recent echo was in October 2018 showing an EF of 25% with moderate left atrial enlargement and MR.  He is followed by  [name] at [facility] has been continued on   amiodarone 200 mg twice daily carvedilol 3.125 mg twice daily and dual antiplatelet therapy with aspirin and Plavix.  He is diuresed at home with torsemide and intermittent metolazone.  He is on spironolactone at 25 mg half a tablet daily.  He was admitted after several days per his report of a worsening shortness of breath.  He states his weight was up as well.  Patient was admitted after being noted to have BNP of 2006.  This was markedly improved from he has BMP 3 weeks ago.  Chest x-ray revealed mild pulmonary edema.  He has ruled out for an mi with mildly elevated hs troponin due to demand  and he had normal coronary arteries by cardiac catheterization several years ago.  He apparently has been referred to [facility] for advanced heart failure assessment and has an appointment with the advanced heart failure clinic July 17 of this year.Marland Kitchen He reports compliance with meds and low sodium diet at home. His creatinine was 1.63 on admission and currently is 2.4. BNP is 1793 down from 2963 1 month ago. CXR revealed slight worsening of his diffuse interstitial edema. EKG showed sr with lbbb.  He iss minus 2 liters since  admission. O2 sats are 92% on 6 liters.  He has not tolerated Entresto in the past.  Past Medical History:  Diagnosis Date  . AICD (automatic cardioverter/defibrillator) present   . AKI (acute kidney injury) ([place]) 12/24/2017  . Arrhythmia   . Arthritis    "lower back, knees" (06/10/2018)  . Cardiac arrest ([place]) 12/23/2017   Brief V-fib arrest  . Chest pain 09/08/2017  . CHF (congestive heart failure) ([facility])    nonischemic cardiomyopathy, EF 25%  . Gout    "on daily RX" (06/10/2018)  . Headache    "q couple months" (06/10/2018)  . High cholesterol   . History of kidney stones   . Hypertension   . Influenza A 12/24/2017  . NICM (nonischemic cardiomyopathy) ([place])   . Pneumonia 12/20/2017  . TIA (transient ischemic attack) 06/21/2016   "still affects my memory a little bit" (06/10/2018)      Surgical History:  Past Surgical History:  Procedure Laterality Date  . EXTERNAL FIXATION LEG Right ~ 2000   "was going bowlegged; had to brake my leg to fix it"  . HERNIA REPAIR    . ICD IMPLANT N/A 12/30/2017   Procedure: ICD IMPLANT;  Surgeon: Deboraha Sprang, MD;  Location: [place] [facility];  Service: Cardiovascular;  Laterality: N/A;  . LEFT HEART CATH AND CORONARY ANGIOGRAPHY N/A 09/09/2017   Procedure: LEFT HEART CATH AND CORONARY ANGIOGRAPHY;  Surgeon: Teodoro Spray, MD;  Location: [place] [facility];  Service: Cardiovascular;  Laterality: N/A;  . UMBILICAL HERNIA REPAIR  1990s  . V TACH ABLATION  06/10/2018  . V TACH ABLATION N/A 06/10/2018   Procedure: V TACH ABLATION;  Surgeon: Thompson Grayer, MD;  Location: [place] [facility];  Service: Cardiovascular;  Laterality: N/A;  . VASECTOMY       Home Meds: Prior to Admission medications   Medication Sig Start Date End Date Taking? Authorizing Provider  acetaminophen (TYLENOL) 325 MG tablet Take 2 tablets (650 mg total) by mouth every 6 (six) hours as needed. Patient taking differently: Take 325-650 mg by mouth.  03/19/18  Yes  Carrie Mew, MD  albuterol (PROVENTIL HFA;VENTOLIN HFA) 108 (90 Base) MCG/ACT inhaler Inhale 2 puffs into the lungs every 4 (four) hours as needed for wheezing or shortness of breath. 09/19/18  Yes Johnson, Megan P, DO  allopurinol (ZYLOPRIM) 100 MG tablet Take 2 tablets (200 mg total) by mouth daily. 10/19/18  Yes Johnson, Megan P, DO  amiodarone (PACERONE) 200 MG tablet Take 200 mg by mouth 2 (two) times daily.   Yes [provider]  aspirin 81 MG chewable tablet Chew 81 mg by mouth daily.   Yes [provider]  atorvastatin (LIPITOR) 40 MG tablet Take 1 tablet (40 mg total) by mouth daily at 6 PM. 04/22/19  Yes Hackney, Otila Kluver A, FNP  carvedilol (COREG) 3.125 MG tablet Take 3.125 mg by mouth 2 (two) times daily with a meal.   Yes [provider]  clopidogrel (PLAVIX) 75 MG tablet TAKE ONE TABLET BY MOUTH EVERY DAY Patient taking differently: Take 75 mg by mouth daily. [facility]/13/19  Yes Bensimhon, Shaune Pascal, MD  colchicine 0.6 MG tablet Take 1 tablet (0.6 mg total) by mouth daily as needed (gout flare). 09/03/18  Yes Johnson, Megan P, DO  cyclobenzaprine (FLEXERIL) 10 MG tablet Take 1 tablet (10 mg total) by mouth at bedtime. Patient taking differently: Take 10 mg by mouth at bedtime as needed for muscle spasms.  05/21/19  Yes Johnson, Megan P, DO  diclofenac sodium (VOLTAREN) 1 % GEL Apply 2 g topically 4 (four) times daily as needed (pain).    Yes [provider]  fluticasone (FLONASE) 50 MCG/ACT nasal spray Place 2 sprays into both nostrils daily. Patient taking differently: Place 2 sprays into both nostrils daily as needed for allergies.  06/20/17  Yes [name], MD  fluticasone-salmeterol (ADVAIR HFA) [phone] MCG/ACT inhaler Inhale 2 puffs into the lungs 2 (two) times daily. 08/12/18  Yes [name], MD  Magnesium 400 MG TABS Take 1 tablet by mouth 2 (two) times daily. 04/22/19  Yes [name], FNP  Multiple Vitamin (MULTIVITAMIN WITH MINERALS)  TABS tablet Take 1 tablet by mouth daily.   Yes [provider]  nitroGLYCERIN (NITROSTAT) 0.4 MG SL tablet Place 1 tablet (0.4 mg total) under the tongue every 5 (five) minutes as needed for chest pain. 04/22/19  Yes Hackney, Tina A, FNP  polyethylene glycol (MIRALAX / GLYCOLAX) 17 g packet Take 17 g by mouth daily. 04/20/19  Yes Mody, Ulice Bold, MD  potassium chloride SA (K-DUR) 20 MEQ tablet Take 1 tablet (20 mEq total) by mouth 2 (two) times daily. 06/26/19  Yes Fritzi Mandes, MD  sennosides-docusate sodium (SENOKOT-S) 8.6-50 MG tablet Take 1 tablet by mouth daily.   Yes [provider]  sodium chloride (OCEAN) 0.65 % SOLN nasal spray Place 1 spray into both nostrils as needed for congestion.   Yes [provider]  spironolactone (ALDACTONE) 25 MG tablet Take 0.5 tablets (12.5 mg total) by mouth daily. 04/15/19  Yes Vaughan Basta, MD  Tiotropium Bromide Monohydrate (SPIRIVA RESPIMAT) 2.5 MCG/ACT AERS Inhale 1 puff into the lungs daily. 08/12/18  Yes [name], MD  torsemide (DEMADEX) 20 MG tablet Take 1 tablet (20 mg total) by mouth 2 (two) times daily. 06/26/19  Yes Fritzi Mandes, MD  Nutritional Supplements (FEEDING SUPPLEMENT, NEPRO CARB STEADY,) LIQD Take 237 mLs by mouth 2 (two) times daily between meals. 06/26/19   Fritzi Mandes, MD    Inpatient Medications:  . allopurinol  200 mg Oral Daily  . amiodarone  200 mg Oral BID  . aspirin  81 mg Oral Daily  . atorvastatin  40 mg Oral q1800  . carvedilol  3.125 mg Oral BID WC  . Chlorhexidine Gluconate Cloth  6 each Topical Q0600  . clopidogrel  75 mg Oral Daily  . enoxaparin (LOVENOX) injection  40 mg Subcutaneous Q24H  . feeding supplement (NEPRO CARB STEADY)  237 mL Oral BID BM  . gabapentin  100 mg Oral BID  . magnesium oxide  400 mg Oral BID  . mometasone-formoterol  2 puff Inhalation BID  . multivitamin with minerals  1 tablet Oral Daily  . senna-docusate  1 tablet Oral Daily  . sodium chloride flush  3 mL  Intravenous Q12H  . tiotropium  1 capsule Inhalation Daily   . sodium chloride    . furosemide (LASIX) infusion      Allergies:  Allergies  Allergen Reactions  . Lisinopril Cough    Social History   Socioeconomic History  . Marital status: Single    Spouse name: Not on file  . Number of children: Not on file  . Years of education: Not on file  . Highest education level: Not on file  Occupational History  . Not on file  Social Needs  . Financial resource strain: Not on file  . Food insecurity    Worry: Not on file    Inability: Not on file  . Transportation needs    Medical: No    Non-medical: No  Tobacco Use  . Smoking status: Former Smoker    Packs/day: 0.33    Years: 33.00    Pack years: 10.89    Types: Cigarettes    Quit date: 02/20/2016    Years since quitting: 3.3  . Smokeless tobacco: Never Used  Substance and Sexual Activity  . Alcohol use: Not Currently    Alcohol/week: 0.0 standard drinks    Comment: 06/10/2018 "beer once/month; if that"  . Drug use: Never  . Sexual activity: Yes  Lifestyle  . Physical activity    Days per week: 2 days    Minutes per session: Not on file  . Stress: Not at all  Relationships  . Social Herbalist on phone: Not on file    Gets together: Not on file    Attends religious service: Not on file    Active member of club or organization: Not on file    Attends meetings of clubs or organizations: Not on file    Relationship status: Not on file  . Intimate partner violence    Fear of current or ex partner: Not on file    Emotionally abused: Not on file    Physically abused: Not on file    Forced sexual activity: Not on file  Other Topics Concern  . Not on file  Social History Narrative  Independent at baseline, ambulates steadily     Family History  Problem Relation Age of Onset  . Hypertension Mother   . Heart failure Mother   . Hypertension Father   . CAD Father   . Heart attack Father      Review  of Systems: A 12-system review of systems was performed and is negative except as noted in the HPI.  Labs: No results for input(s): CKTOTAL, CKMB, TROPONINI in the last 72 hours. Lab Results  Component Value Date   WBC 12.3 (H) 07/02/2019   HGB 11.1 (L) 07/02/2019   HCT 34.2 (L) 07/02/2019   MCV 86.6 07/02/2019   PLT 516 (H) 07/02/2019    Recent Labs  Lab 07/01/19 1710  07/04/19 0417  NA 136   < > 136  K 4.3   < > 5.1  CL 95*   < > 97*  CO2 31   < > 26  BUN 41*   < > 52*  CREATININE 1.64*   < > 2.40*  CALCIUM 9.2   < > 9.1  PROT 7.4  --   --   BILITOT 1.4*  --   --   ALKPHOS 109  --   --   ALT 55*  --   --   AST 29  --   --   GLUCOSE 98   < > 132*   < > = values in this interval not displayed.   Lab Results  Component Value Date   CHOL 217 (H) 12/03/2018   HDL 47 12/03/2018   LDLCALC 134 (H) 12/03/2018   TRIG 179 (H) 12/03/2018   Lab Results  Component Value Date   DDIMER 1.13 (H) 09/27/2017    Radiology/Studies:  Dg Chest 1 View  Result Date: 07/01/2019 CLINICAL DATA:  Shortness of breath, lower extremity swelling. EXAM: CHEST  1 VIEW COMPARISON:  06/23/2019 FINDINGS: Cardiomegaly with vascular congestion and mild bilateral pulmonary opacities, likely edema. Findings are similar to prior study. No visible effusions. No acute bony abnormality. IMPRESSION: Cardiomegaly with vascular congestion and diffuse bilateral airspace opacities, likely edema/CHF. Findings similar to prior study. Electronically Signed   By: Rolm Baptise M.D.   On: 07/01/2019 22:39   Dg Chest 2 View  Result Date: 06/12/2019 CLINICAL DATA:  Chest pain EXAM: CHEST - 2 VIEW COMPARISON:  05/30/2019 FINDINGS: The heart size is enlarged. There is volume overload with findings of developing pulmonary edema. There are more focal areas of increased attenuation in the retrocardiac region and at the right lung base. No pneumothorax. A left-sided ICD is noted. There is no acute osseous abnormality.  IMPRESSION: 1. Cardiomegaly with developing pulmonary edema. 2. More focal airspace opacities in the bilateral lower lung zones favored to represent sequela of developing pulmonary edema as opposed to a developing infectious process. Attention on follow-up examinations is recommended. Electronically Signed   By: Constance Holster M.D.   On: 06/12/2019 19:38   Dg Knee 2 Views Left  Result Date: 07/02/2019 CLINICAL DATA:  Knee pain, swelling EXAM: LEFT KNEE - 1-2 VIEW COMPARISON:  10/17/2018 FINDINGS: Old healed proximal tibial fracture with deformity. Advanced tricompartment degenerative changes most pronounced in the medial compartment. Moderate joint effusion. No acute fracture, subluxation or dislocation. Findings are similar prior study. IMPRESSION: Remote posttraumatic deformity of the proximal tibia. Tricompartment degenerative changes with moderate joint effusion. No acute bony abnormality. Electronically Signed   By: Rolm Baptise M.D.   On: 07/02/2019 00:20   US Venous Img  Lower Unilateral Left  Result Date: 07/02/2019 CLINICAL DATA:  Left leg swelling EXAM: LEFT LOWER EXTREMITY VENOUS DOPPLER ULTRASOUND TECHNIQUE: Gray-scale sonography with graded compression, as well as color Doppler and duplex ultrasound were performed to evaluate the lower extremity deep venous systems from the level of the common femoral vein and including the common femoral, femoral, profunda femoral, popliteal and calf veins including the posterior tibial, peroneal and gastrocnemius veins when visible. The superficial great saphenous vein was also interrogated. Spectral Doppler was utilized to evaluate flow at rest and with distal augmentation maneuvers in the common femoral, femoral and popliteal veins. COMPARISON:  None. FINDINGS: Contralateral Common Femoral Vein: Respiratory phasicity is normal and symmetric with the symptomatic side. No evidence of thrombus. Normal compressibility. Common Femoral Vein: No evidence of  thrombus. Normal compressibility, respiratory phasicity and response to augmentation. Saphenofemoral Junction: No evidence of thrombus. Normal compressibility and flow on color Doppler imaging. Profunda Femoral Vein: No evidence of thrombus. Normal compressibility and flow on color Doppler imaging. Femoral Vein: No evidence of thrombus. Normal compressibility, respiratory phasicity and response to augmentation. Popliteal Vein: No evidence of thrombus. Normal compressibility, respiratory phasicity and response to augmentation. Calf Veins: No evidence of thrombus. Normal compressibility and flow on color Doppler imaging. Superficial Great Saphenous Vein: No evidence of thrombus. Normal compressibility. Venous Reflux:  None. Other Findings:  None. IMPRESSION: No evidence of deep venous thrombosis. Electronically Signed   By: Rolm Baptise M.D.   On: 07/02/2019 01:06   Dg Chest Port 1 View  Result Date: 07/03/2019 CLINICAL DATA:  Congestive heart failure. EXAM: PORTABLE CHEST 1 VIEW COMPARISON:  Multiple previous chest x-rays. The most recent is 07/01/2019. FINDINGS: Stable cardiac enlargement and mild tortuosity of the thoracic aorta. There is worsening CHF with marked central vascular congestion and interstitial and alveolar pulmonary edema, more pronounced on the right than left. Suspect left pleural effusion. IMPRESSION: Slight worsening diffuse and slightly asymmetric interstitial and airspace process, likely worsening edema/CHF. Persistent left effusion. Electronically Signed   By: Marijo Sanes M.D.   On: 07/03/2019 09:21   Dg Chest Port 1 View  Result Date: 06/23/2019 CLINICAL DATA:  Shortness of breath. EXAM: PORTABLE CHEST 1 VIEW COMPARISON:  06/21/2019. FINDINGS: Cardiac pacer stable position. Stable severe cardiomegaly. Diffuse bilateral pulmonary infiltrates/edema right side greater than left again noted. Similar findings on prior exam. No prominent pleural effusion or pneumothorax. IMPRESSION: 1.   Cardiac pacer stable position.  Severe cardiomegaly again noted. 2. Diffuse bilateral pulmonary infiltrates/edema right side greater than left again noted. Similar findings on prior exam. Electronically Signed   By: [place]   On: 06/23/2019 15:40   Dg Chest Port 1 View  Result Date: 06/21/2019 CLINICAL DATA:  Worsening shortness of breath. EXAM: PORTABLE CHEST 1 VIEW COMPARISON:  06/21/2019 earlier FINDINGS: Left-sided pacemaker unchanged. Lungs are adequately inflated demonstrate stable bilateral patchy airspace process right worse than left likely multifocal pneumonia. Stable cardiomegaly. Remainder of the exam is unchanged. IMPRESSION: Stable bilateral patchy multifocal airspace process right worse than left likely multifocal pneumonia. Stable cardiomegaly. Electronically Signed   By: Marin Olp M.D.   On: 06/21/2019 08:45   Dg Chest Port 1 View  Result Date: 06/21/2019 CLINICAL DATA:  Shortness of breath. EXAM: PORTABLE CHEST 1 VIEW COMPARISON:  06/14/2019 FINDINGS: Single lead left-sided pacemaker remains in place. Prominent cardiomegaly is unchanged from prior exam. Worsening diffuse bilateral airspace opacities since prior exam. Suspect small bilateral pleural effusion. No pneumothorax. IMPRESSION: Worsening diffuse bilateral airspace opacities,  may represent pulmonary edema or multifocal pneumonia. Probable bilateral pleural effusions. Cardiomegaly is unchanged. Electronically Signed   By: Keith Rake M.D.   On: 06/21/2019 00:58   Dg Abd Acute 2+v W 1v Chest  Result Date: 06/14/2019 CLINICAL DATA:  Shortness of breath. Intractable nausea and vomiting. New right lower abdominal pain. EXAM: DG ABDOMEN ACUTE W/ 1V CHEST COMPARISON:  Two-view chest x-ray 06/12/2019 FINDINGS: Heart is enlarged. Moderate pulmonary vascular congestion is present. There is slight increase in a diffuse interstitial pattern. Bibasilar opacities are present without significant consolidation. The bowel gas  pattern is normal. Mild prominence of stool in the ascending colon and rectum. No obstruction is present. Degenerative changes are noted in the lower lumbar spine. IMPRESSION: 1. Stable cardiomegaly with increasing interstitial pattern consistent with worsening edema. 2. Infection versus atelectasis at the lung bases. 3. Normal bowel gas pattern.  No obstruction or free air. Electronically Signed   By: San Morelle M.D.   On: 06/14/2019 15:16   Dg Duanne Limerick W Single Cm (sol Or Thin Ba)  Result Date: 06/17/2019 CLINICAL DATA:  Nausea vomiting. EXAM: UPPER GI SERIES WITH KUB TECHNIQUE: After obtaining a scout radiograph a routine upper GI series was performed using thin density barium FLUOROSCOPY TIME:  Fluoroscopy Time:  1 minutes 30 seconds Radiation Exposure Index (if provided by the fluoroscopic device): 50.9 mGy Number of Acquired Spot Images: 17 COMPARISON:  Abdomen series 06/14/2019. FINDINGS: KUB reveals cardiomegaly and right lower lobe infiltrate. Right lower lobe infiltrate would be consistent with pneumonia. Limited exam due to patient vomiting. Persistent lung smooth narrowing noted of the distal third of the thoracic esophagus. Endoscopic evaluation should be considered stomach appears normal. Duodenum appears normal. C-loop appears normal. No definite reflux noted. IMPRESSION: 1.  Cardiomegaly. 2.  Right lower lobe infiltrates consistent with pneumonia. 3. Long smooth narrowing over the distal third of the thoracic esophagus. Endoscopic evaluation should be considered. 4. Stomach and duodenum appear normal. This exam was limited due to patient vomiting. Electronically Signed   By: Marcello Moores  Register   On: 06/17/2019 08:37    Wt Readings from Last 3 Encounters:  07/04/19 96.6 kg  06/26/19 95.5 kg  05/30/19 98.9 kg    EKG: sinus rhythm with lbbb  Physical Exam:  Blood pressure 97/78, pulse 83, temperature 98.4 F (36.9 C), temperature source Oral, resp. rate 19, height 6\' 1"  (1.854 m),  weight 96.6 kg, SpO2 92 %. Body mass index is 28.09 kg/m. General: Well developed, well nourished, in no acute distress. Head: Normocephalic, atraumatic, sclera non-icteric, no xanthomas, nares are without discharge.  Neck: Negative for carotid bruits. JVD not elevated. Lungs: Clear bilaterally to auscultation without wheezes, rales, or rhonchi. Breathing is unlabored. Heart: RRR with S1 S2. No murmurs, rubs, or gallops appreciated. Abdomen: Soft, non-tender, non-distended with normoactive bowel sounds. No hepatomegaly. No rebound/guarding. No obvious abdominal masses. Msk:  Strength and tone appear normal for age. Extremities: No clubbing or cyanosis. No edema.  Distal pedal pulses are 2+ and equal bilaterally. Neuro: Alert and oriented X 3. No facial asymmetry. No focal deficit. Moves all extremities spontaneously. Psych:  Responds to questions appropriately with a normal affect.     Assessment and Plan  53 year old male with a nonischemic cardiomyopathy with ejection fraction of 10 to 15% with a AICD in place who is on dual antiplatelet therapy, torsemide and intermittent metolazone.  He reports compliance with his medications.  He is on 12.5 mg of spironolactone daily.  He has not  tolerated Entresto in the past.  He has renal insufficiency as well as volume overload.  On previous admissions he has required transient dialysis for volume and electrolyte control.  He is planning on having hemodialysis today.  We will continue to follow on current regimen strictly enforce a low-sodium diet.  Will need follow-up with advanced heart failure as is planned.  Will follow along with you.  Signed, Teodoro Spray MD 07/04/2019, 8:40 AM Pager: [phone]

## 2019-07-04 NOTE — Consult Note (Signed)
[facility] [name]  Patient name: Stephen [name] MRN: [id number] DOB: Jul 28, 1966 Sex: male     REASON FOR CONSULT:    Dialysis catheter  HISTORY OF PRESENT ILLNESS:   Stephen [name] is a 53 y.o. male, who is in need of dialysis.  I have been asked to place a temporary dialysis catheter  PAST MEDICAL HISTORY    Past Medical History:  Diagnosis Date  . AICD (automatic cardioverter/defibrillator) present   . AKI (acute kidney injury) ([place]) 12/24/2017  . Arrhythmia   . Arthritis    "lower back, knees" (06/10/2018)  . Cardiac arrest ([place]) 12/23/2017   Brief V-fib arrest  . Chest pain 09/08/2017  . CHF (congestive heart failure) ([facility])    nonischemic cardiomyopathy, EF 25%  . Gout    "on daily RX" (06/10/2018)  . Headache    "q couple months" (06/10/2018)  . High cholesterol   . History of kidney stones   . Hypertension   . Influenza A 12/24/2017  . NICM (nonischemic cardiomyopathy) ([place])   . Pneumonia 12/20/2017  . TIA (transient ischemic attack) 06/21/2016   "still affects my memory a little bit" (06/10/2018)     FAMILY HISTORY   Family History  Problem Relation Age of Onset  . Hypertension Mother   . Heart failure Mother   . Hypertension Father   . CAD Father   . Heart attack Father     SOCIAL HISTORY:   Social History   Socioeconomic History  . Marital status: Single    Spouse name: Not on file  . Number of children: Not on file  . Years of education: Not on file  . Highest education level: Not on file  Occupational History  . Not on file  Social Needs  . Financial resource strain: Not on file  . Food insecurity    Worry: Not on file    Inability: Not on file  . Transportation needs    Medical: No    Non-medical: No  Tobacco Use  . Smoking status: Former Smoker    Packs/day: 0.33    Years: 33.00    Pack years: 10.89    Types: Cigarettes    Quit date: 02/20/2016    Years since  quitting: 3.3  . Smokeless tobacco: Never Used  Substance and Sexual Activity  . Alcohol use: Not Currently    Alcohol/week: 0.0 standard drinks    Comment: 06/10/2018 "beer once/month; if that"  . Drug use: Never  . Sexual activity: Yes  Lifestyle  . Physical activity    Days per week: 2 days    Minutes per session: Not on file  . Stress: Not at all  Relationships  . Social Herbalist on phone: Not on file    Gets together: Not on file    Attends religious service: Not on file    Active member of club or organization: Not on file    Attends meetings of clubs or organizations: Not on file    Relationship status: Not on file  . Intimate partner violence    Fear of current or ex partner: Not on file    Emotionally abused: Not on file    Physically abused: Not on file    Forced sexual activity: Not on file  Other Topics Concern  . Not on file  Social History Narrative   Independent at baseline, ambulates steadily    ALLERGIES:    Allergies  Allergen  Reactions  . Lisinopril Cough    CURRENT MEDICATIONS:    Current Facility-Administered Medications  Medication Dose Route Frequency Provider Last Rate Last Dose  . 0.9 %  sodium chloride infusion  250 mL Intravenous PRN Seals, Levada Dy H, NP      . acetaminophen (TYLENOL) tablet 650 mg  650 mg Oral Q4H PRN Gardiner Barefoot H, NP   650 mg at 07/04/19 1118  . allopurinol (ZYLOPRIM) tablet 200 mg  200 mg Oral Daily Hillary Bow, MD   200 mg at 07/04/19 0847  . amiodarone (PACERONE) tablet 200 mg  200 mg Oral BID Hillary Bow, MD   200 mg at 07/04/19 0849  . aspirin chewable tablet 81 mg  81 mg Oral Daily Hillary Bow, MD   81 mg at 07/04/19 0849  . atorvastatin (LIPITOR) tablet 40 mg  40 mg Oral q1800 Hillary Bow, MD   40 mg at 07/03/19 1654  . carvedilol (COREG) tablet 3.125 mg  3.125 mg Oral BID WC Hillary Bow, MD   3.125 mg at 07/03/19 1113  . Chlorhexidine Gluconate Cloth 2 % PADS 6 each  6 each Topical  Q0600 Lateef, Munsoor, MD      . clopidogrel (PLAVIX) tablet 75 mg  75 mg Oral Daily Hillary Bow, MD   75 mg at 07/03/19 1654  . cyclobenzaprine (FLEXERIL) tablet 10 mg  10 mg Oral QHS PRN Hillary Bow, MD   10 mg at 07/04/19 1118  . enoxaparin (LOVENOX) injection 40 mg  40 mg Subcutaneous Q24H Seals, Levada Dy H, NP   40 mg at 07/04/19 0648  . feeding supplement (NEPRO CARB STEADY) liquid 237 mL  237 mL Oral BID BM Sudini, Srikar, MD   237 mL at 07/03/19 1446  . furosemide (LASIX) 250 mg in dextrose 5 % 250 mL (1 mg/mL) infusion  8 mg/hr Intravenous Continuous Hillary Bow, MD 8 mL/hr at 07/04/19 1108 8 mg/hr at 07/04/19 1108  . gabapentin (NEURONTIN) capsule 100 mg  100 mg Oral BID Hillary Bow, MD   100 mg at 07/04/19 0848  . guaiFENesin (ROBITUSSIN) 100 MG/5ML solution 100 mg  5 mL Oral Q6H PRN Hillary Bow, MD   100 mg at 07/04/19 0849  . magnesium oxide (MAG-OX) tablet 400 mg  400 mg Oral BID Hillary Bow, MD   400 mg at 07/04/19 0848  . mometasone-formoterol (DULERA) 200-5 MCG/ACT inhaler 2 puff  2 puff Inhalation BID Hillary Bow, MD   2 puff at 07/04/19 0847  . multivitamin with minerals tablet 1 tablet  1 tablet Oral Daily Sudini, Alveta Heimlich, MD   1 tablet at 07/04/19 0847  . nitroGLYCERIN (NITROSTAT) SL tablet 0.4 mg  0.4 mg Sublingual Q5 min PRN Sudini, Alveta Heimlich, MD      . ondansetron [facility]) injection 4 mg  4 mg Intravenous Q6H PRN Seals, Theo Dills, NP   4 mg at 07/03/19 2026  . phenol (CHLORASEPTIC) mouth spray 1 spray  1 spray Mouth/Throat PRN Seals, Theo Dills, NP   1 spray at 07/02/19 2234  . senna-docusate (Senokot-S) tablet 1 tablet  1 tablet Oral Daily Hillary Bow, MD   1 tablet at 07/04/19 0848  . sodium chloride flush (NS) 0.9 % injection 3 mL  3 mL Intravenous Q12H Seals, Angela H, NP   3 mL at 07/03/19 2028  . sodium chloride flush (NS) 0.9 % injection 3 mL  3 mL Intravenous PRN Seals, Theo Dills, NP      . tiotropium [facility]) inhalation capsule [facility] use  ONLY) 18 mcg   1 capsule Inhalation Daily Hillary Bow, MD   18 mcg at 07/04/19 0849    REVIEW OF SYSTEMS:   [X]  denotes positive finding, [ ]  denotes negative finding Cardiac  Comments:  Chest pain or chest pressure:    Shortness of breath upon exertion:    Short of breath when lying flat:    Irregular heart rhythm:        Vascular    Pain in calf, thigh, or hip brought on by ambulation:    Pain in feet at night that wakes you up from your sleep:     Blood clot in your veins:    Leg swelling:         Pulmonary    Oxygen at home:    Productive cough:     Wheezing:         Neurologic    Sudden weakness in arms or legs:     Sudden numbness in arms or legs:     Sudden onset of difficulty speaking or slurred speech:    Temporary loss of vision in one eye:     Problems with dizziness:         Gastrointestinal    Blood in stool:      Vomited blood:         Genitourinary    Burning when urinating:     Blood in urine:        Psychiatric    Major depression:         Hematologic    Bleeding problems:    Problems with blood clotting too easily:        Skin    Rashes or ulcers:        Constitutional    Fever or chills:     PHYSICAL EXAM:   Vitals:   07/04/19 0145 07/04/19 0210 07/04/19 0358 07/04/19 0709  BP:  93/72 95/78 97/78   Pulse:   87 83  Resp:   20 19  Temp:   98.2 F (36.8 C) 98.4 F (36.9 C)  TempSrc:   Oral Oral  SpO2: 92%  90% 92%  Weight:   96.6 kg   Height:        GENERAL: The patient is a well-nourished male, in no acute distress. The vital signs are documented above. CARDIAC: There is a regular rate and rhythm.  PULMONARY: Nonlabored respirations ABDOMEN: Soft and non-tender MUSCULOSKELETAL: There are no major deformities or cyanosis. NEUROLOGIC: No focal weakness or paresthesias are detected. SKIN: There are no ulcers or rashes noted. PSYCHIATRIC: The patient has a normal affect.  STUDIES:   none  ASSESSMENT and PLAN   I discussed placing a  temporary femoral dialysis catheter.  Patient is in agreement   Annamarie Major, IV, MD, FACS [facility] [phone] Pager [phone]

## 2019-07-04 NOTE — Progress Notes (Signed)
[facility] [place] NAME: Stephen [name]    MR#:  [id number]  DATE OF BIRTH:  1966/11/28  SUBJECTIVE:  CHIEF COMPLAINT:   Chief Complaint  Patient presents with  . Leg Swelling  . Shortness of Breath   More short of breath.  Worsening.  On 6 L oxygen today.  Also has nausea.  REVIEW OF SYSTEMS:    Review of Systems  Constitutional: Positive for malaise/fatigue. Negative for chills and fever.  HENT: Negative for sore throat.   Eyes: Negative for blurred vision, double vision and pain.  Respiratory: Positive for shortness of breath. Negative for cough, hemoptysis and wheezing.   Cardiovascular: Positive for orthopnea. Negative for chest pain, palpitations and leg swelling.  Gastrointestinal: Negative for abdominal pain, constipation, diarrhea, heartburn, nausea and vomiting.  Genitourinary: Negative for dysuria and hematuria.  Musculoskeletal: Positive for joint pain. Negative for back pain.  Skin: Negative for rash.  Neurological: Negative for sensory change, speech change, focal weakness and headaches.  Endo/Heme/Allergies: Does not bruise/bleed easily.  Psychiatric/Behavioral: Negative for depression. The patient is not nervous/anxious.    DRUG ALLERGIES:   Allergies  Allergen Reactions  . Lisinopril Cough    VITALS:  Blood pressure 97/78, pulse 83, temperature 98.4 F (36.9 C), temperature source Oral, resp. rate 19, height 6\' 1"  (1.854 m), weight 96.6 kg, SpO2 92 %.  PHYSICAL EXAMINATION:   Physical Exam  GENERAL:  53 y.o.-year-old patient lying in the bed with dyspnea. EYES: Pupils equal, round, reactive to light and accommodation. No scleral icterus. Extraocular muscles intact.  HEENT: Head atraumatic, normocephalic. Oropharynx and nasopharynx clear.  NECK:  Supple, no jugular venous distention. No thyroid enlargement, no tenderness.  LUNGS: Bilateral crackles CARDIOVASCULAR: S1, S2 normal. No murmurs, rubs, or  gallops.  ABDOMEN: Soft, nontender, nondistended. Bowel sounds present. No organomegaly or mass.  EXTREMITIES: No cyanosis, clubbing.  Lower extremity edema NEUROLOGIC: Cranial nerves II through XII are intact. No focal Motor or sensory deficits b/l.   PSYCHIATRIC: The patient is alert and oriented x 3.  SKIN: No obvious rash, lesion, or ulcer.   LABORATORY PANEL:   CBC Recent Labs  Lab 07/02/19 0515  WBC 12.3*  HGB 11.1*  HCT 34.2*  PLT 516*   ------------------------------------------------------------------------------------------------------------------ Chemistries  Recent Labs  Lab 07/01/19 1710  07/04/19 0417  NA 136   < > 136  K 4.3   < > 5.1  CL 95*   < > 97*  CO2 31   < > 26  GLUCOSE 98   < > 132*  BUN 41*   < > 52*  CREATININE 1.64*   < > 2.40*  CALCIUM 9.2   < > 9.1  AST 29  --   --   ALT 55*  --   --   ALKPHOS 109  --   --   BILITOT 1.4*  --   --    < > = values in this interval not displayed.   ------------------------------------------------------------------------------------------------------------------  Cardiac Enzymes No results for input(s): TROPONINI in the last 168 hours. ------------------------------------------------------------------------------------------------------------------  RADIOLOGY:  Dg Chest Port 1 View  Result Date: 07/03/2019 CLINICAL DATA:  Congestive heart failure. EXAM: PORTABLE CHEST 1 VIEW COMPARISON:  Multiple previous chest x-rays. The most recent is 07/01/2019. FINDINGS: Stable cardiac enlargement and mild tortuosity of the thoracic aorta. There is worsening CHF with marked central vascular congestion and interstitial and alveolar pulmonary edema, more pronounced on the right than left. Suspect left  pleural effusion. IMPRESSION: Slight worsening diffuse and slightly asymmetric interstitial and airspace process, likely worsening edema/CHF. Persistent left effusion. Electronically Signed   By: Marijo Sanes M.D.   On:  07/03/2019 09:21     ASSESSMENT AND PLAN:   * Acute on chronic systolic CHF with acute hypoxic respiratory failure present on admission Patient was initially on IV Lasix 40 mg twice daily.  I transitioned this to IV Lasix drip yesterday which had to be stopped due to acute kidney injury. Today he has diffuse crackles on examination and chest x-ray continues to show fluid. Discussed with nephrology Dr. Zollie Scale.  Patient will be started on dialysis In the interim due to his critical illness I will go ahead and start him on Lasix drip. Also discussed with Dr. Ubaldo Glassing of cardiology. Vascular surgery consulted for temporary dialysis catheter placement  * Left knee effusion --Status post left arthrocentesis in the emergency room with some immediate pain relief Has been a recurrent problem.  Osteoarthritis.  *CKD stage III. Monitor while being diuresed  *Bilateral lower extremity neuropathy.  Started Neurontin.  * Generalized weakness with deconditioning - Physical therapy consulted for supportive care  All the records are reviewed and case discussed with Care Management/Social Worker Management plans discussed with the patient, family and they are in agreement.  CODE STATUS: FULL CODE  TOTAL CRITICAL CARE TIME TAKING CARE OF THIS PATIENT: 35 minutes.   POSSIBLE D/C IN 2-3 DAYS, DEPENDING ON CLINICAL CONDITION.  [name] M.D on 07/04/2019 at 11:52 AM  Between 7am to 6pm - Pager - [phone]  After 6pm go to [email or web address] - password [email or web address] [facility]  Office  [phone]  CC: Primary care physician; Valerie Roys, DO  Note: This dictation was prepared with Dragon dictation along with smaller phrase technology. Any transcriptional errors that result from this process are unintentional.

## 2019-07-04 NOTE — Progress Notes (Signed)
Pt transported to Special Procedures for insertion of temporary hemodialysis catheter.

## 2019-07-04 NOTE — Progress Notes (Signed)
HD Tx started    07/04/19 1947  Vital Signs  Pulse Rate 80  Pulse Rate Source Monitor  Resp (!) 30  BP 98/78  BP Location Left Arm  BP Method Automatic  Patient Position (if appropriate) Lying  Oxygen Therapy  SpO2 100 %  O2 Device Non-rebreather Mask  Pulse Oximetry Type Continuous  During Hemodialysis Assessment  Blood Flow Rate (mL/min) 200 mL/min  Arterial Pressure (mmHg) -110 mmHg  Venous Pressure (mmHg) 140 mmHg  Transmembrane Pressure (mmHg) 30 mmHg  Ultrafiltration Rate (mL/min) 300 mL/min  Dialysate Flow Rate (mL/min) 300 ml/min  Conductivity: Machine  14  HD Safety Checks Performed Yes  Dialysis Fluid Bolus Normal Saline  Bolus Amount (mL) 250 mL  Intra-Hemodialysis Comments Tx initiated

## 2019-07-04 NOTE — Progress Notes (Signed)
Pre HD Tx   07/04/19 1915  Hand-Off documentation  Report given to (Full Name) Beatris Ship, RN   Report received from (Full Name) Garvin Sink, RN   Vital Signs  Temp 98.2 F (36.8 C)  Temp Source Oral  Pulse Rate 81  Pulse Rate Source Monitor  Resp (!) 22  BP (!) 148/130  BP Location Left Arm  BP Method Automatic  Patient Position (if appropriate) Lying  Oxygen Therapy  SpO2 100 %  O2 Device Non-rebreather Mask  Pulse Oximetry Type Continuous  Pain Assessment  Pain Scale 0-10  Pain Score 1  Dialysis Weight  Weight 96.3 kg  Type of Weight Pre-Dialysis  Time-Out for Hemodialysis  What Procedure? HD   Pt Identifiers(min of two) First/Last Name;MRN/Account#  Correct Site? Yes  Correct Side? Yes  Correct Procedure? Yes  Consents Verified? Yes  Rad Studies Available? N/A  Safety Precautions Reviewed? No  Engineer, civil (consulting) Number 4  Station Number  (Bedside ICU 9)  UF/Alarm Test Passed  Conductivity: Meter 13.8  Conductivity: Machine  13.9  pH 7.2  Reverse Osmosis Main  Normal Saline Lot Number [id number]  Dialyzer Lot Number [id number]  Disposable Set Lot Number [id number]  Machine Temperature 98.6 F (37 C)  Musician and Audible Yes  Blood Lines Intact and Secured Yes  Pre Treatment Patient Checks  Vascular access used during treatment Catheter  HD catheter dressing before treatment WDL  Hepatitis B Surface Antigen Results Negative  Date Hepatitis B Surface Antigen Drawn 06/17/19  Hepatitis B Surface Antibody  (<10)  Date Hepatitis B Surface Antibody Drawn 06/17/19  Hemodialysis Consent Verified Yes  Hemodialysis Standing Orders Initiated Yes  ECG (Telemetry) Monitor On Yes  Prime Ordered Normal Saline  Length of  DialysisTreatment -hour(s) 2 Hour(s)  Dialysis Treatment Comments Na 140  Dialyzer Elisio 17H NR  Dialysate 2K;2.5 Ca  Dialysis Anticoagulant None  Dialysate Flow Ordered 300  Blood Flow Rate Ordered 200 mL/min  Ultrafiltration  Goal 0.5 Liters  Dialysis Blood Pressure Support Ordered Normal Saline  Education / Care Plan  Dialysis Education Provided Yes  Documented Education in Care Plan Yes  Hemodialysis Catheter Right Triple lumen Temporary (Non-Tunneled)  Placement Date/Time: 07/04/19 1250   Placed prior to admission: No  Person Inserting Catheter: Dr. Lucky Cowboy  Orientation: Right  Hemodialysis Catheter Type: Triple lumen Temporary (Non-Tunneled)  Site Condition No complications  Blue Lumen Status Blood return noted  Red Lumen Status Blood return noted  Purple Lumen Status Capped (Central line)  Dressing Type Biopatch;Occlusive  Dressing Status Clean;Dry;Intact  Dressing Change Due 07/05/19

## 2019-07-04 NOTE — Progress Notes (Signed)
Post HD Assessment    07/04/19 2151  Neurological  Level of Consciousness Alert  Orientation Level Oriented X4  Respiratory  Respiratory Pattern Labored  Chest Assessment Chest expansion symmetrical  Bilateral Breath Sounds Fine crackles  Cough Dry  Cardiac  Pulse Regular  Heart Sounds S1, S2  ECG Monitor Yes  Cardiac Rhythm NSR;Heart block;BBB  Heart Block Type 1st degree AVB  Antiarrhythmic device  Antiarrhythmic device ICD  Vascular  R Radial Pulse +2  L Radial Pulse +2  Edema Generalized  Psychosocial  Psychosocial (WDL) WDL  Patient Behaviors Calm;Cooperative  Needs Expressed Physical  Emotional support given Given to patient

## 2019-07-04 NOTE — Progress Notes (Signed)
Patient arrived to ICU RM 9 at this time. Alert and oriented. Pt transferred self to ICU bed. VSS.

## 2019-07-04 NOTE — Progress Notes (Signed)
Patient complaining of sob, tachycardiac, now requiring 6L.. Crackles in the bases. MD Mansy Notified. Verbal order given for 40 mg IV lasix.   Update: Patient BP 93/72. NP Seals notified. Per MD give 20 mg IV lasix instead of 40 mg IV lasix.

## 2019-07-04 NOTE — Progress Notes (Signed)
Nurse called report regarding placement of temp. Dialysis catheter and the need for 100% Non- re-breather to maintain oxygen sats in the 90's.  Transported on oxygen to room by RN.

## 2019-07-04 NOTE — Progress Notes (Signed)
Patient down for temporary dialysis catheter insertion.>  Patient very SOB at rest and on 6 l/m nasal cannula..  Patient placed on 100% oxygen in order lay the patent  flat to place the dialysis catheter into his right groin.  Nurse on floor notified of the patients oxygen sats.

## 2019-07-04 NOTE — Progress Notes (Signed)
HD Tx completed   07/04/19 2145  Vital Signs  Pulse Rate Source Monitor  BP Location Left Arm  BP Method Automatic  Patient Position (if appropriate) Lying  Oxygen Therapy  SpO2 99 %  O2 Device Non-rebreather Mask  Pulse Oximetry Type Continuous  During Hemodialysis Assessment  HD Safety Checks Performed Yes  KECN 21.2 [facility]  Dialysis Fluid Bolus Normal Saline  Bolus Amount (mL) 250 mL  Intra-Hemodialysis Comments Tx completed

## 2019-07-04 NOTE — Progress Notes (Addendum)
1250--Pt returned to room 231 s/p temporary dialysis catheter insertion.  Right femoral site is CDI. Pt is on NRB for transport.  1251--resumed supplemental oxygen at 6L/min [place].  SpO2 down to 69%.  NRB resumed with SpO2 of 94%.  Respiratory Therapist called.  1253--Dialysis called this Probation officer and informed her that pt would not be going for dialysis for another 25 minutes (minimum).  1255--SpO2 97%; RR 32 with accessory muscle use noted.  1258--Spoke with Dr. [name] and provided an update on pt's respiratory distress.  Received new orders for BiPAP and plan to transfer to SDU.  1350--Report called to Jinny Blossom, RN in ICU.  1358--Pt transported to [place] with assistance from Respiratory Therapist.  1450--Spoke with pt's sister, [name], and provided her with an update on pt's status and transfer to [place].

## 2019-07-04 NOTE — Plan of Care (Signed)
  Problem: Clinical Measurements: Goal: Respiratory complications will improve Outcome: Progressing Goal: Cardiovascular complication will be avoided Outcome: Progressing   Problem: Activity: Goal: Risk for activity intolerance will decrease Outcome: Progressing   Problem: Pain Managment: Goal: General experience of comfort will improve Outcome: Progressing   

## 2019-07-04 NOTE — Progress Notes (Signed)
Pre HD assessment    07/04/19 1930  Neurological  Level of Consciousness Alert  Orientation Level Oriented X4  Respiratory  Respiratory Pattern Labored  Chest Assessment Chest expansion symmetrical  Bilateral Breath Sounds Fine crackles  Cough None  Cardiac  Pulse Regular  Heart Sounds S1, S2  ECG Monitor Yes  Cardiac Rhythm NSR;Heart block;BBB  Heart Block Type 1st degree AVB  Antiarrhythmic device  Antiarrhythmic device ICD  Vascular  R Radial Pulse +2  L Radial Pulse +2  Psychosocial  Psychosocial (WDL) WDL  Patient Behaviors Calm;Cooperative  Needs Expressed Physical  Emotional support given Given to patient

## 2019-07-04 NOTE — Op Note (Signed)
    Patient name: Stephen [name] MRN: [id number] DOB: 1966-10-12 Sex: male  07/04/2019 Pre-operative Diagnosis: Acute Renal Failure Post-operative diagnosis:  Same Surgeon:  Annamarie Major Procedure:   Ultrasound-guided placement of a right femoral trialysis catheter  Indications: The patient suffers from renal failure as well as heart failure.  Dialysis has been recommended.  Procedure:  The patient was identified in the holding area and taken to AR-VAS/IR 1  The patient was then placed supine on the table. local anesthesia was administered.  The patient was prepped and draped in the usual sterile fashion.  A time out was called.  Ultrasound was used to evaluate the right femoral vein which was widely patent and easily compressible.  A digital ultrasound image was acquired.  1% lidocaine was used for local anesthesia.  A 11 blade was used to make a skin nick and the right common femoral vein was cannulated under ultrasound guidance with an 18-gauge needle.  A 035 wire was advanced without resistance.  The subcutaneous tract was dilated with sequential dilators and a trialysis catheter was inserted fully.  All 3 ports flushed and aspirated without difficulty.  The catheter was sutured into position, and sterile dressings were applied.  The patient did require 100% nonrebreather oxygen to maintain his saturation levels.     Theotis Burrow, M.D., [facility] [place] Office: [phone] Pager:  [phone]

## 2019-07-04 NOTE — Progress Notes (Signed)
Post HD Tx    07/04/19 2147  Hand-Off documentation  Report given to (Full Name) Lincoln Sink, RN   Report received from (Full Name) Beatris Ship, RN   Vital Signs  Temp 98.2 F (36.8 C)  Temp Source Oral  Pulse Rate Source Monitor  BP Location Left Arm  BP Method Automatic  Patient Position (if appropriate) Lying  Oxygen Therapy  SpO2 99 %  O2 Device Non-rebreather Mask  Pulse Oximetry Type Continuous  Pain Assessment  Pain Scale 0-10  Pain Score 0  Dialysis Weight  Weight 95.8 kg  Type of Weight Post-Dialysis  Post-Hemodialysis Assessment  Rinseback Volume (mL) 250 mL  KECN 21.2 V  Dialyzer Clearance Lightly streaked  Duration of HD Treatment -hour(s) 2 hour(s)  Hemodialysis Intake (mL) 500 mL  UF Total -Machine (mL) 1000 mL  Net UF (mL) 500 mL  Tolerated HD Treatment Yes  Hemodialysis Catheter Right Triple lumen Temporary (Non-Tunneled)  Placement Date/Time: 07/04/19 1250   Placed prior to admission: No  Person Inserting Catheter: Dr. Lucky Cowboy  Orientation: Right  Hemodialysis Catheter Type: Triple lumen Temporary (Non-Tunneled)  Site Condition No complications  Post treatment catheter status Capped and Clamped

## 2019-07-04 NOTE — Progress Notes (Signed)
[facility]  ROUNDING NOTE   Subjective:  UOP dropped to 550cc over the preceding 24 hours.  Came in with heart failure exacerbation. During last admission was placed on dialysis temporarily.    Objective:  Vital signs in last 24 hours:  Temp:  [98.2 F (36.8 C)-99 F (37.2 C)] 98.3 F (36.8 C) (07/04 1416) Pulse Rate:  [77-87] 77 (07/04 1251) Resp:  [15-38] 38 (07/04 1416) BP: (91-102)/(67-86) 102/86 (07/04 1416) SpO2:  [84 %-100 %] 100 % (07/04 1416) Weight:  [96.3 kg-96.6 kg] 96.3 kg (07/04 1416)  Weight change: 0.635 kg Filed Weights   07/03/19 0236 07/04/19 0358 07/04/19 1416  Weight: 95.9 kg 96.6 kg 96.3 kg    Intake/Output: I/O last 3 completed shifts: In: 265.4 [P.O.:240; I.V.:25.4] Out: 1300 [Urine:1300]   Intake/Output this shift:  No intake/output data recorded.  Physical Exam: General: NAD  Head: Normocephalic, atraumatic. Moist oral mucosal membranes  Eyes: Anicteric  Neck: Supple, trachea midline  Lungs:  Bilateral rales, normal effort  Heart: Regular rate and rhythm  Abdomen:  Soft, nontender, BS present  Extremities: no peripheral edema.  Neurologic: Nonfocal, moving all four extremities  Skin: No lesions       Basic Metabolic Panel: Recent Labs  Lab 07/01/19 1710 07/02/19 0515 07/03/19 0658 07/03/19 2126 07/04/19 0417  NA 136  --  137 135 136  K 4.3  --  3.9 5.3* 5.1  CL 95*  --  100 97* 97*  CO2 31  --  25 24 26   GLUCOSE 98  --  120* 142* 132*  BUN 41*  --  39* 48* 52*  CREATININE 1.64* 1.69* 1.63* 2.15* 2.40*  CALCIUM 9.2  --  8.9 9.2 9.1  PHOS  --   --   --   --  4.1    Liver Function Tests: Recent Labs  Lab 07/01/19 1710  AST 29  ALT 55*  ALKPHOS 109  BILITOT 1.4*  PROT 7.4  ALBUMIN 2.7*   No results for input(s): LIPASE, AMYLASE in the last 168 hours. No results for input(s): AMMONIA in the last 168 hours.  CBC: Recent Labs  Lab 07/01/19 1710 07/02/19 0515  WBC 13.7* 12.3*  HGB 12.0* 11.1*   HCT 37.0* 34.2*  MCV 86.9 86.6  PLT 547* 516*    Cardiac Enzymes: No results for input(s): CKTOTAL, CKMB, CKMBINDEX, TROPONINI in the last 168 hours.  BNP: Invalid input(s): POCBNP  CBG: Recent Labs  Lab 07/04/19 1422  GLUCAP 133*    Microbiology: Results for orders placed or performed during the hospital encounter of 07/01/19  SARS Coronavirus 2 (CEPHEID- Performed in [place] hospital lab), Hosp Order     Status: None   Collection Time: 07/01/19 10:54 PM   Specimen: Nasopharyngeal Swab  Result Value Ref Range Status   SARS Coronavirus 2 NEGATIVE NEGATIVE Final    Comment: (NOTE) If result is NEGATIVE SARS-CoV-2 target nucleic acids are NOT DETECTED. The SARS-CoV-2 RNA is generally detectable in upper and lower  respiratory specimens during the acute phase of infection. The lowest  concentration of SARS-CoV-2 viral copies this assay can detect is 250  copies / mL. A negative result does not preclude SARS-CoV-2 infection  and should not be used as the sole basis for treatment or other  patient management decisions.  A negative result may occur with  improper specimen collection / handling, submission of specimen other  than nasopharyngeal swab, presence of viral mutation(s) within the  areas targeted by  this assay, and inadequate number of viral copies  (<250 copies / mL). A negative result must be combined with clinical  observations, patient history, and epidemiological information. If result is POSITIVE SARS-CoV-2 target nucleic acids are DETECTED. The SARS-CoV-2 RNA is generally detectable in upper and lower  respiratory specimens dur ing the acute phase of infection.  Positive  results are indicative of active infection with SARS-CoV-2.  Clinical  correlation with patient history and other diagnostic information is  necessary to determine patient infection status.  Positive results do  not rule out bacterial infection or co-infection with other viruses. If  result is PRESUMPTIVE POSTIVE SARS-CoV-2 nucleic acids MAY BE PRESENT.   A presumptive positive result was obtained on the submitted specimen  and confirmed on repeat testing.  While 2019 novel coronavirus  (SARS-CoV-2) nucleic acids may be present in the submitted sample  additional confirmatory testing may be necessary for epidemiological  and / or clinical management purposes  to differentiate between  SARS-CoV-2 and other Sarbecovirus currently known to infect humans.  If clinically indicated additional testing with an alternate test  methodology [phone]) is advised. The SARS-CoV-2 RNA is generally  detectable in upper and lower respiratory sp ecimens during the acute  phase of infection. The expected result is Negative. Fact Sheet for Patients:  StrictlyIdeas.no Fact Sheet for Healthcare Providers: BankingDealers.co.za This test is not yet approved or cleared by the [place] FDA and has been authorized for detection and/or diagnosis of SARS-CoV-2 by FDA under an Emergency Use Authorization (EUA).  This EUA will remain in effect (meaning this test can be used) for the duration of the COVID-19 declaration under Section 564(b)(1) of the Act, 21 U.S.C. section 360bbb-3(b)(1), unless the authorization is terminated or revoked sooner. Performed at [facility], [address] [place], [place] [address]   Body fluid culture     Status: None (Preliminary result)   Collection Time: 07/01/19 11:52 PM   Specimen: KNEE; Body Fluid  Result Value Ref Range Status   Specimen Description   Final    KNEE Performed at [facility], [address] [place], [place] [address]    Special Requests   Final    NONE Performed at [facility], [address] [place], [place] [address]    Gram Stain   Final    MODERATE WBC PRESENT, PREDOMINANTLY PMN NO ORGANISMS SEEN    Culture   Final    NO GROWTH 2  DAYS Performed at [facility], [place] [address], [place], [place] [address]    Report Status PENDING  Incomplete    Coagulation Studies: No results for input(s): LABPROT, INR in the last 72 hours.  Urinalysis: No results for input(s): COLORURINE, LABSPEC, PHURINE, GLUCOSEU, HGBUR, BILIRUBINUR, KETONESUR, PROTEINUR, UROBILINOGEN, NITRITE, LEUKOCYTESUR in the last 72 hours.  Invalid input(s): APPERANCEUR    Imaging: Dg Chest Port 1 View  Result Date: 07/03/2019 CLINICAL DATA:  Congestive heart failure. EXAM: PORTABLE CHEST 1 VIEW COMPARISON:  Multiple previous chest x-rays. The most recent is 07/01/2019. FINDINGS: Stable cardiac enlargement and mild tortuosity of the thoracic aorta. There is worsening CHF with marked central vascular congestion and interstitial and alveolar pulmonary edema, more pronounced on the right than left. Suspect left pleural effusion. IMPRESSION: Slight worsening diffuse and slightly asymmetric interstitial and airspace process, likely worsening edema/CHF. Persistent left effusion. Electronically Signed   By: Marijo Sanes M.D.   On: 07/03/2019 09:21     Medications:   .  sodium chloride    . sodium chloride    . sodium chloride    . furosemide (LASIX) infusion 8 mg/hr (07/04/19 1108)   . allopurinol  200 mg Oral Daily  . amiodarone  200 mg Oral BID  . aspirin  81 mg Oral Daily  . atorvastatin  40 mg Oral q1800  . carvedilol  3.125 mg Oral BID WC  . Chlorhexidine Gluconate Cloth  6 each Topical Q0600  . clopidogrel  75 mg Oral Daily  . enoxaparin (LOVENOX) injection  40 mg Subcutaneous Q24H  . feeding supplement (NEPRO CARB STEADY)  237 mL Oral BID BM  . gabapentin  100 mg Oral BID  . magnesium oxide  400 mg Oral BID  . mometasone-formoterol  2 puff Inhalation BID  . multivitamin with minerals  1 tablet Oral Daily  . senna-docusate  1 tablet Oral Daily  . sodium chloride flush  3 mL Intravenous Q12H  . tiotropium  1 capsule Inhalation Daily    sodium chloride, sodium chloride, sodium chloride, acetaminophen, alteplase, cyclobenzaprine, guaiFENesin, heparin, lidocaine (PF), lidocaine-prilocaine, nitroGLYCERIN, ondansetron (ZOFRAN) IV, pentafluoroprop-tetrafluoroeth, phenol, sodium chloride flush  Assessment/ Plan:  [name] is a 53 y.o. black male withchronic systolic heart failure ejection fraction 10-15%, prior history of cardiac arrest, hyperlipidemia, nephrolithiasis, hypertension, history of TIA, was [facility]/12[facility]  for acute exacerbation of systolic congestive heart failure, EF 10%  1. Acute renal failure with hyperkalemia and metabolic acidosis onChronic kidney disease stage III baseline creatinine 1.69, GFR of 55. Appears to have cardiorenal syndrome. -  Will reinitiate dialysis this admission for the pt, appreciate vascular surgery assistance.  1st HD today.   2. Acute on chronic systolic heart failure:  Frequent exacerbations noted, will plan for HD with UF.      LOS: 2 Laniesha Das 7/4/20203:12 PM

## 2019-07-05 ENCOUNTER — Inpatient Hospital Stay: Payer: PRIVATE HEALTH INSURANCE

## 2019-07-05 LAB — BODY FLUID CULTURE: Culture: NO GROWTH

## 2019-07-05 LAB — BASIC METABOLIC PANEL
Anion gap: 12 (ref 5–15)
Anion gap: 10 (ref 5–15)
BUN: 56 mg/dL — ABNORMAL HIGH (ref 6–20)
BUN: 49 mg/dL — ABNORMAL HIGH (ref 6–20)
CO2: 28 mmol/L (ref 22–32)
CO2: 27 mmol/L (ref 22–32)
Calcium: 9 mg/dL (ref 8.9–10.3)
Calcium: 8.9 mg/dL (ref 8.9–10.3)
Chloride: 96 mmol/L — ABNORMAL LOW (ref 98–111)
Chloride: 98 mmol/L (ref 98–111)
Creatinine, Ser: 2.55 mg/dL — ABNORMAL HIGH (ref 0.61–1.24)
Creatinine, Ser: 1.9 mg/dL — ABNORMAL HIGH (ref 0.61–1.24)
GFR calc Af Amer: 32 mL/min — ABNORMAL LOW (ref >60)
GFR calc Af Amer: 45 mL/min — ABNORMAL LOW (ref >60)
GFR calc non Af Amer: 27 mL/min — ABNORMAL LOW (ref >60)
GFR calc non Af Amer: 39 mL/min — ABNORMAL LOW (ref >60)
Glucose, Bld: 141 mg/dL — ABNORMAL HIGH (ref 70–99)
Glucose, Bld: 133 mg/dL — ABNORMAL HIGH (ref 70–99)
Potassium: 5.2 mmol/L — ABNORMAL HIGH (ref 3.5–5.1)
Potassium: 4.6 mmol/L (ref 3.5–5.1)
Sodium: 136 mmol/L (ref 135–145)
Sodium: 135 mmol/L (ref 135–145)

## 2019-07-05 LAB — RENAL FUNCTION PANEL
Albumin: 2.4 g/dL — ABNORMAL LOW (ref 3.5–5.0)
Albumin: 2.4 g/dL — ABNORMAL LOW (ref 3.5–5.0)
Albumin: 2.3 g/dL — ABNORMAL LOW (ref 3.5–5.0)
Anion gap: 12 (ref 5–15)
Anion gap: 11 (ref 5–15)
Anion gap: 10 (ref 5–15)
BUN: 62 mg/dL — ABNORMAL HIGH (ref 6–20)
BUN: 54 mg/dL — ABNORMAL HIGH (ref 6–20)
BUN: 41 mg/dL — ABNORMAL HIGH (ref 6–20)
CO2: 26 mmol/L (ref 22–32)
CO2: 24 mmol/L (ref 22–32)
CO2: 27 mmol/L (ref 22–32)
Calcium: 8.9 mg/dL (ref 8.9–10.3)
Calcium: 9.1 mg/dL (ref 8.9–10.3)
Calcium: 9 mg/dL (ref 8.9–10.3)
Chloride: 98 mmol/L (ref 98–111)
Chloride: 100 mmol/L (ref 98–111)
Chloride: 98 mmol/L (ref 98–111)
Creatinine, Ser: 2.7 mg/dL — ABNORMAL HIGH (ref 0.61–1.24)
Creatinine, Ser: 2.2 mg/dL — ABNORMAL HIGH (ref 0.61–1.24)
Creatinine, Ser: 1.68 mg/dL — ABNORMAL HIGH (ref 0.61–1.24)
GFR calc Af Amer: 30 mL/min — ABNORMAL LOW (ref >60)
GFR calc Af Amer: 38 mL/min — ABNORMAL LOW (ref >60)
GFR calc Af Amer: 53 mL/min — ABNORMAL LOW (ref >60)
GFR calc non Af Amer: 26 mL/min — ABNORMAL LOW (ref >60)
GFR calc non Af Amer: 33 mL/min — ABNORMAL LOW (ref >60)
GFR calc non Af Amer: 45 mL/min — ABNORMAL LOW (ref >60)
Glucose, Bld: 129 mg/dL — ABNORMAL HIGH (ref 70–99)
Glucose, Bld: 127 mg/dL — ABNORMAL HIGH (ref 70–99)
Glucose, Bld: 117 mg/dL — ABNORMAL HIGH (ref 70–99)
Phosphorus: 5 mg/dL — ABNORMAL HIGH (ref 2.5–4.6)
Phosphorus: 4.1 mg/dL (ref 2.5–4.6)
Phosphorus: 2.8 mg/dL (ref 2.5–4.6)
Potassium: 5.1 mmol/L (ref 3.5–5.1)
Potassium: 4.8 mmol/L (ref 3.5–5.1)
Potassium: 4.7 mmol/L (ref 3.5–5.1)
Sodium: 136 mmol/L (ref 135–145)
Sodium: 135 mmol/L (ref 135–145)
Sodium: 135 mmol/L (ref 135–145)

## 2019-07-05 LAB — MAGNESIUM
Magnesium: 2.4 mg/dL (ref 1.7–2.4)
Magnesium: 2.2 mg/dL (ref 1.7–2.4)
Magnesium: 2.1 mg/dL (ref 1.7–2.4)
Magnesium: 2 mg/dL (ref 1.7–2.4)

## 2019-07-05 LAB — CBC
HCT: 32.3 % — ABNORMAL LOW (ref 39.0–52.0)
Hemoglobin: 10.3 g/dL — ABNORMAL LOW (ref 13.0–17.0)
MCH: 27.9 pg (ref 26.0–34.0)
MCHC: 31.9 g/dL (ref 30.0–36.0)
MCV: 87.5 fL (ref 80.0–100.0)
Platelets: 414 10*3/uL — ABNORMAL HIGH (ref 150–400)
RBC: 3.69 MIL/uL — ABNORMAL LOW (ref 4.22–5.81)
RDW: 17.2 % — ABNORMAL HIGH (ref 11.5–15.5)
WBC: 11.6 10*3/uL — ABNORMAL HIGH (ref 4.0–10.5)
nRBC: 0 % (ref 0.0–0.2)

## 2019-07-05 LAB — LACTIC ACID, PLASMA: Lactic Acid, Venous: 1.5 mmol/L (ref 0.5–1.9)

## 2019-07-05 LAB — PHOSPHORUS: Phosphorus: 4.9 mg/dL — ABNORMAL HIGH (ref 2.5–4.6)

## 2019-07-05 LAB — PARATHYROID HORMONE, INTACT (NO CA): PTH: 59 pg/mL (ref 15–65)

## 2019-07-05 MED ORDER — MIDODRINE HCL 5 MG PO TABS
10.0000 mg | ORAL_TABLET | Freq: Three times a day (TID) | ORAL | Status: DC
  Administered 2019-07-05 – 2019-07-08 (×7): 10 mg via ORAL
  Filled 2019-07-05 (×12): qty 2

## 2019-07-05 MED ORDER — SODIUM CHLORIDE 0.9 % IV SOLN
3.0000 g | Freq: Four times a day (QID) | INTRAVENOUS | Status: DC
  Administered 2019-07-05 – 2019-07-06 (×3): 3 g via INTRAVENOUS
  Filled 2019-07-05: qty 3
  Filled 2019-07-05 (×3): qty 8
  Filled 2019-07-05 (×2): qty 3

## 2019-07-05 MED ORDER — HEPARIN SODIUM (PORCINE) 5000 UNIT/ML IJ SOLN
5000.0000 [IU] | Freq: Three times a day (TID) | INTRAMUSCULAR | Status: DC
  Administered 2019-07-05 – 2019-07-08 (×10): 5000 [IU] via SUBCUTANEOUS
  Filled 2019-07-05 (×8): qty 1

## 2019-07-05 MED ORDER — PUREFLOW DIALYSIS SOLUTION
INTRAVENOUS | Status: DC
  Administered 2019-07-05: 12:00:00 via INTRAVENOUS_CENTRAL
  Administered 2019-07-06: 1500 mL via INTRAVENOUS_CENTRAL
  Administered 2019-07-06 (×2): 3 via INTRAVENOUS_CENTRAL
  Administered 2019-07-06: 1500 mL via INTRAVENOUS_CENTRAL
  Administered 2019-07-07 (×2): via INTRAVENOUS_CENTRAL

## 2019-07-05 MED ORDER — FUROSEMIDE 10 MG/ML IJ SOLN
8.0000 mg/h | INTRAVENOUS | Status: DC
  Filled 2019-07-05: qty 25

## 2019-07-05 MED ORDER — HEPARIN SODIUM (PORCINE) 1000 UNIT/ML DIALYSIS
1000.0000 [IU] | INTRAMUSCULAR | Status: DC | PRN
  Administered 2019-07-07 (×2): 1800 [IU] via INTRAVENOUS_CENTRAL
  Filled 2019-07-05 (×2): qty 6
  Filled 2019-07-05 (×2): qty 2
  Filled 2019-07-05: qty 6

## 2019-07-05 MED ORDER — FUROSEMIDE 10 MG/ML IJ SOLN
INTRAMUSCULAR | Status: AC
  Administered 2019-07-05: 15:00:00 40 mg
  Filled 2019-07-05: qty 4

## 2019-07-05 MED ORDER — IPRATROPIUM-ALBUTEROL 0.5-2.5 (3) MG/3ML IN SOLN
3.0000 mL | Freq: Four times a day (QID) | RESPIRATORY_TRACT | Status: DC
  Administered 2019-07-05 – 2019-07-08 (×13): 3 mL via RESPIRATORY_TRACT
  Filled 2019-07-05 (×13): qty 3

## 2019-07-05 MED ORDER — FUROSEMIDE 10 MG/ML IJ SOLN: 40.0000 mg | Freq: Once | INTRAMUSCULAR | Status: DC

## 2019-07-05 NOTE — Consult Note (Signed)
PULMONARY / CRITICAL CARE MEDICINE  Name: Stephen [name] MRN: [id number] DOB: [birth year]    LOS: 3  Referring Provider: Dr. Darvin [name] Reason for Referral: Acute renal failure requiring emergent dialysis Brief patient description:  53 y/o male admitted with acute systolic CHF exacerbation, acute hypoxic respiratory failure, left knee effusion and CKD stage III; now in acute renal failure with hyperkalemia and metabolic acidosis respiratory failure. HD initiated today  HPI: This is a 53 year old African-American male with a medical history as indicated below, nonischemic cardiomyopathy with an ejection fraction of 10 to 15% and an AICD, who was admitted on 07/01/2019 with increased lower extremity edema, dyspnea and left knee effusion.  He underwent a right knee aspiration in the ED.  His initial blood pressure was low but improved while in the ED.  His ED work-up was significant for proBNP of 1793 and diffuse interstitial edema on chest x-ray.  He was admitted with acute systolic heart failure with exacerbation and started on IV diuresis.  He was seen by cardiology and nephrology for worsening renal failure.  Due to decrease in his urine output, the decision was made to start hemodialysis hence he was transferred to the ICU for further management.  He is currently undergoing hemodialysis.  He denies chest pain, palpitations, nausea and vomiting.  He is on supplemental oxygen by nonrebreather.  Past Medical History:  Diagnosis Date  . AICD (automatic cardioverter/defibrillator) present   . AKI (acute kidney injury) ([facility]) 12/24/2017  . Arrhythmia   . Arthritis    "lower back, knees" (06/10/2018)  . Cardiac arrest ([place]) 12/23/2017   Brief V-fib arrest  . Chest pain 09/08/2017  . CHF (congestive heart failure) ([facility])    nonischemic cardiomyopathy, EF 25%  . Gout    "on daily RX" (06/10/2018)  . Headache    "q couple months" (06/10/2018)  . High cholesterol   . History of kidney stones   .  Hypertension   . Influenza A 12/24/2017  . NICM (nonischemic cardiomyopathy) ([facility])   . Pneumonia 12/20/2017  . TIA (transient ischemic attack) 06/21/2016   "still affects my memory a little bit" (06/10/2018)   Past Surgical History:  Procedure Laterality Date  . EXTERNAL FIXATION LEG Right ~ 2000   "was going bowlegged; had to brake my leg to fix it"  . HERNIA REPAIR    . ICD IMPLANT N/A 12/30/2017   Procedure: ICD IMPLANT;  Surgeon: Deboraha Sprang, MD;  Location: [facility];  Service: Cardiovascular;  Laterality: N/A;  . LEFT HEART CATH AND CORONARY ANGIOGRAPHY N/A 09/09/2017   Procedure: LEFT HEART CATH AND CORONARY ANGIOGRAPHY;  Surgeon: Teodoro Spray, MD;  Location: [facility];  Service: Cardiovascular;  Laterality: N/A;  . UMBILICAL HERNIA REPAIR  1990s  . V TACH ABLATION  06/10/2018  . V TACH ABLATION N/A 06/10/2018   Procedure: V TACH ABLATION;  Surgeon: Thompson Grayer, MD;  Location: [facility];  Service: Cardiovascular;  Laterality: N/A;  . VASECTOMY     No current facility-administered medications on file prior to encounter.    Current Outpatient Medications on File Prior to Encounter  Medication Sig  . acetaminophen (TYLENOL) 325 MG tablet Take 2 tablets (650 mg total) by mouth every 6 (six) hours as needed. (Patient taking differently: Take 325-650 mg by mouth. )  . albuterol (PROVENTIL HFA;VENTOLIN HFA) 108 (90 Base) MCG/ACT inhaler Inhale 2 puffs into the lungs every 4 (four) hours as needed for wheezing or shortness  of breath.  . allopurinol (ZYLOPRIM) 100 MG tablet Take 2 tablets (200 mg total) by mouth daily.  Marland Kitchen amiodarone (PACERONE) 200 MG tablet Take 200 mg by mouth 2 (two) times daily.  Marland Kitchen aspirin 81 MG chewable tablet Chew 81 mg by mouth daily.  Marland Kitchen atorvastatin (LIPITOR) 40 MG tablet Take 1 tablet (40 mg total) by mouth daily at 6 PM.  . carvedilol (COREG) 3.125 MG tablet Take 3.125 mg by mouth 2 (two) times daily with a meal.  .  clopidogrel (PLAVIX) 75 MG tablet TAKE ONE TABLET BY MOUTH EVERY DAY (Patient taking differently: Take 75 mg by mouth daily. At [facility])  . colchicine 0.6 MG tablet Take 1 tablet (0.6 mg total) by mouth daily as needed (gout flare).  . cyclobenzaprine (FLEXERIL) 10 MG tablet Take 1 tablet (10 mg total) by mouth at bedtime. (Patient taking differently: Take 10 mg by mouth at bedtime as needed for muscle spasms. )  . diclofenac sodium (VOLTAREN) 1 % GEL Apply 2 g topically 4 (four) times daily as needed (pain).   . fluticasone (FLONASE) 50 MCG/ACT nasal spray Place 2 sprays into both nostrils daily. (Patient taking differently: Place 2 sprays into both nostrils daily as needed for allergies. )  . fluticasone-salmeterol (ADVAIR HFA) 115-21 MCG/ACT inhaler Inhale 2 puffs into the lungs 2 (two) times daily.  . Magnesium 400 MG TABS Take 1 tablet by mouth 2 (two) times daily.  . Multiple Vitamin (MULTIVITAMIN WITH MINERALS) TABS tablet Take 1 tablet by mouth daily.  . nitroGLYCERIN (NITROSTAT) 0.4 MG SL tablet Place 1 tablet (0.4 mg total) under the tongue every 5 (five) minutes as needed for chest pain.  . polyethylene glycol (MIRALAX / GLYCOLAX) 17 g packet Take 17 g by mouth daily.  . potassium chloride SA (K-DUR) 20 MEQ tablet Take 1 tablet (20 mEq total) by mouth 2 (two) times daily.  . sennosides-docusate sodium (SENOKOT-S) 8.6-50 MG tablet Take 1 tablet by mouth daily.  . sodium chloride (OCEAN) 0.65 % SOLN nasal spray Place 1 spray into both nostrils as needed for congestion.  Marland Kitchen spironolactone (ALDACTONE) 25 MG tablet Take 0.5 tablets (12.5 mg total) by mouth daily.  . Tiotropium Bromide Monohydrate (SPIRIVA RESPIMAT) 2.5 MCG/ACT AERS Inhale 1 puff into the lungs daily.  Marland Kitchen torsemide (DEMADEX) 20 MG tablet Take 1 tablet (20 mg total) by mouth 2 (two) times daily.  . Nutritional Supplements (FEEDING SUPPLEMENT, NEPRO CARB STEADY,) LIQD Take 237 mLs by mouth 2 (two) times daily between meals.     Allergies Allergies  Allergen Reactions  . Lisinopril Cough    Family History Family History  Problem Relation Age of Onset  . Hypertension Mother   . Heart failure Mother   . Hypertension Father   . CAD Father   . Heart attack Father    Social History  reports that he quit smoking about 3 years ago. His smoking use included cigarettes. He has a 10.89 pack-year smoking history. He has never used smokeless tobacco. He reports previous alcohol use. He reports that he does not use drugs.  Review Of Systems:   Constitutional: Negative for fever and chills.  HENT: Negative for congestion and rhinorrhea.  Eyes: Negative for redness and visual disturbance.  Respiratory: Positive for shortness of breath but negative wheezing.  Cardiovascular: Negative for chest pain and palpitations but positive for edema.  Gastrointestinal: Negative  for nausea , vomiting and abdominal pain and  Loose stools Genitourinary: Negative for dysuria and urgency.  Endocrine: Denies polyuria, polyphagia and heat intolerance Musculoskeletal: Negative for myalgias and arthralgias.  Skin: Negative for pallor and wound.  Neurological: Negative for dizziness and headaches   VITAL SIGNS: BP 98/82   Pulse 86   Temp 98.3 F (36.8 C) (Oral)   Resp 20   Ht 6\' 2"  (1.88 m)   Wt 90.4 kg   SpO2 97%   BMI 25.59 kg/m   HEMODYNAMICS:    VENTILATOR SETTINGS:    INTAKE / OUTPUT: I/O last 3 completed shifts: In: 373.6 [P.O.:340; I.V.:33.6] Out: 625 [Urine:625]  PHYSICAL EXAMINATION: General: Chronically ill looking, in no distress HEENT: PERRLA, trachea midline, moderate JVD Neuro: Somnolent but follows basic commands, speech is normal, moves all extremities Cardiovascular: Pulse regular, S1-S2, no murmur regurg or gallop, +2 pulses bilaterally, bilateral lower extremity edema Lungs: Increased work of breathing, bilateral breath sounds without any wheezes or rhonchi, diminished in the bases Abdomen:  Nondistended, normal bowel sounds in all 4 quadrants, palpation reveals no organomegaly Musculoskeletal: Positive range of motion, right knee Skin: Warm and dry  LABS:  BMET Recent Labs  Lab 07/03/19 2126 07/04/19 0417 07/05/19 0418  NA 135 136 136  K 5.3* 5.1 5.2*  CL 97* 97* 96*  CO2 24 26 28   BUN 48* 52* 56*  CREATININE 2.15* 2.40* 2.55*  GLUCOSE 142* 132* 141*    Electrolytes Recent Labs  Lab 07/03/19 2126 07/04/19 0417 07/05/19 0418  CALCIUM 9.2 9.1 9.0  MG  --   --  2.4  PHOS  --  4.1 4.9*    CBC Recent Labs  Lab 07/01/19 1710 07/02/19 0515 07/05/19 0418  WBC 13.7* 12.3* 11.6*  HGB 12.0* 11.1* 10.3*  HCT 37.0* 34.2* 32.3*  PLT 547* 516* 414*    Coag's No results for input(s): APTT, INR in the last 168 hours.  Sepsis Markers No results for input(s): LATICACIDVEN, PROCALCITON, O2SATVEN in the last 168 hours.  ABG No results for input(s): PHART, PCO2ART, PO2ART in the last 168 hours.  Liver Enzymes Recent Labs  Lab 07/01/19 1710  AST 29  ALT 55*  ALKPHOS 109  BILITOT 1.4*  ALBUMIN 2.7*    Cardiac Enzymes No results for input(s): TROPONINI, PROBNP in the last 168 hours.  Glucose Recent Labs  Lab 07/04/19 1422  GLUCAP 133*    Imaging No results found.   STUDIES:  None  CULTURES: None  ANTIBIOTICS: None  SIGNIFICANT EVENTS: 07/01/2019: Admitted 07/04/2019: Started on hemodialysis  LINES/TUBES: Right femoral hemodialysis catheter  DISCUSSION: 53 year old African-American male presenting with cardiorenal syndrome, left knee effusion status post aspiration and worsening renal failure requiring emergent hemodialysis.  ASSESSMENT Acute renal failure requiring hemodialysis Hyperkalemia Acute systolic heart failure with exacerbation Ischemic cardiomyopathy with reduced EF of 10 to 15% Left knee effusion status post aspiration Acute hypoxic respiratory failure requiring BiPAP Acute pulmonary edema   PLAN Hemodynamic  monitoring per ICU protocol Hemodialysis per nephrology- 0.5 L removed during today's HD session Cardiology following Post HD labs reviewed-persistent hyperkalemia Nebulized bronchodilators and inhaled steroids Rest of the treatment plan remains unchanged Palliative care consult  Best Practice: Code Status: Full code Diet: Heart healthy GI prophylaxis: Not indicated  VTE prophylaxis: SCDs and subcu heparin  FAMILY  - Updates: No family at bedside.  Patient's family updated by admitting service.  Will update with any new changes  Kirke Breach S. Tukov-Yual ANP-BC Pulmonary and [place] Pager [phone] or [phone]  NB: This document was prepared using Systems analyst  and may include unintentional dictation errors.    07/05/2019, 6:10 AM

## 2019-07-05 NOTE — Progress Notes (Signed)
MEDICATION RELATED CONSULT NOTE - INITIAL   Pharmacy Consult for Review Meds DAILY for adjustment due to CRRT  Allergies  Allergen Reactions  . Lisinopril Cough    Patient Measurements: Height: 6\' 2"  (188 cm) Weight: 199 lb 4.7 oz (90.4 kg) IBW/kg (Calculated) : 82.2 Adjusted Body Weight:    Vital Signs: Temp: 98.3 F (36.8 C) (07/05 0200) Temp Source: Oral (07/05 0200) BP: 96/79 (07/05 0900) Pulse Rate: 79 (07/05 0900) Intake/Output from previous day: 07/04 0701 - 07/05 0700 In: 108.2 [P.O.:100; I.V.:8.2] Out: 685 [Urine:185] Intake/Output from this shift: Total I/O In: -  Out: 50 [Urine:50]  Labs: Recent Labs    07/03/19 2126 07/04/19 0417 07/05/19 0418  WBC  --   --  11.6*  HGB  --   --  10.3*  HCT  --   --  32.3*  PLT  --   --  414*  CREATININE 2.15* 2.40* 2.55*  MG  --   --  2.4  PHOS  --  4.1 4.9*   Estimated Creatinine Clearance: 38.5 mL/min (A) (by C-G formula based on SCr of 2.55 mg/dL (H)).   Microbiology: Recent Results (from the past 720 hour(s))  SARS Coronavirus 2 (CEPHEID- Performed in [place] hospital lab), Hosp Order     Status: None   Collection Time: 06/12/19  9:52 PM   Specimen: Nasopharyngeal Swab  Result Value Ref Range Status   SARS Coronavirus 2 NEGATIVE NEGATIVE Final    Comment: (NOTE) If result is NEGATIVE SARS-CoV-2 target nucleic acids are NOT DETECTED. The SARS-CoV-2 RNA is generally detectable in upper and lower  respiratory specimens during the acute phase of infection. The lowest  concentration of SARS-CoV-2 viral copies this assay can detect is 250  copies / mL. A negative result does not preclude SARS-CoV-2 infection  and should not be used as the sole basis for treatment or other  patient management decisions.  A negative result may occur with  improper specimen collection / handling, submission of specimen other  than nasopharyngeal swab, presence of viral mutation(s) within the  areas targeted by this assay,  and inadequate number of viral copies  (<250 copies / mL). A negative result must be combined with clinical  observations, patient history, and epidemiological information. If result is POSITIVE SARS-CoV-2 target nucleic acids are DETECTED. The SARS-CoV-2 RNA is generally detectable in upper and lower  respiratory specimens dur ing the acute phase of infection.  Positive  results are indicative of active infection with SARS-CoV-2.  Clinical  correlation with patient history and other diagnostic information is  necessary to determine patient infection status.  Positive results do  not rule out bacterial infection or co-infection with other viruses. If result is PRESUMPTIVE POSTIVE SARS-CoV-2 nucleic acids MAY BE PRESENT.   A presumptive positive result was obtained on the submitted specimen  and confirmed on repeat testing.  While 2019 novel coronavirus  (SARS-CoV-2) nucleic acids may be present in the submitted sample  additional confirmatory testing may be necessary for epidemiological  and / or clinical management purposes  to differentiate between  SARS-CoV-2 and other Sarbecovirus currently known to infect humans.  If clinically indicated additional testing with an alternate test  methodology [phone]) is advised. The SARS-CoV-2 RNA is generally  detectable in upper and lower respiratory sp ecimens during the acute  phase of infection. The expected result is Negative. Fact Sheet for Patients:  StrictlyIdeas.no Fact Sheet for Healthcare Providers: BankingDealers.co.za This test is not yet approved or cleared  by the [place] and has been authorized for detection and/or diagnosis of SARS-CoV-2 by FDA under an Emergency Use Authorization (EUA).  This EUA will remain in effect (meaning this test can be used) for the duration of the COVID-19 declaration under Section 564(b)(1) of the Act, 21 U.S.C. section 360bbb-3(b)(1), unless  the authorization is terminated or revoked sooner. Performed at [facility], [address] [place], [place] [address]   Expectorated sputum assessment w rflx to resp cult     Status: None   Collection Time: 06/15/19 10:49 PM   Specimen: Expectorated Sputum  Result Value Ref Range Status   Specimen Description EXPECTORATED SPUTUM  Final   Special Requests Normal  Final   Sputum evaluation   Final    THIS SPECIMEN IS ACCEPTABLE FOR SPUTUM CULTURE Performed at [facility], [address], [place], [place] [address]    Report Status 06/16/2019 FINAL  Final  Culture, respiratory     Status: None   Collection Time: 06/15/19 10:49 PM  Result Value Ref Range Status   Specimen Description   Final    EXPECTORATED SPUTUM Performed at [facility], [address] [place], [place] [address]    Special Requests   Final    Normal Reflexed from [id number] Performed at [facility], [address] [place], [place] [address]    Gram Stain   Final    MODERATE WBC PRESENT, PREDOMINANTLY PMN ABUNDANT GRAM POSITIVE COCCI ABUNDANT GRAM POSITIVE RODS    Culture   Final    ABUNDANT Consistent with normal respiratory flora. Performed at [facility], [place] [address] [place], [place] [address]    Report Status 06/18/2019 FINAL  Final  MRSA PCR Screening     Status: None   Collection Time: 06/17/19 11:20 AM   Specimen: Nasopharyngeal  Result Value Ref Range Status   MRSA by PCR NEGATIVE NEGATIVE Final    Comment:        The GeneXpert MRSA Assay (FDA approved for NASAL specimens only), is one component of a comprehensive MRSA colonization surveillance program. It is not intended to diagnose MRSA infection nor to guide or monitor treatment for MRSA infections. Performed at [facility], [address] [place],  [address]   SARS Coronavirus 2 (CEPHEID- Performed in [facility] hospital lab), Hosp Order     Status: None    Collection Time: 07/01/19 10:54 PM   Specimen: Nasopharyngeal Swab  Result Value Ref Range Status   SARS Coronavirus 2 NEGATIVE NEGATIVE Final    Comment: (NOTE) If result is NEGATIVE SARS-CoV-2 target nucleic acids are NOT DETECTED. The SARS-CoV-2 RNA is generally detectable in upper and lower  respiratory specimens during the acute phase of infection. The lowest  concentration of SARS-CoV-2 viral copies this assay can detect is 250  copies / mL. A negative result does not preclude SARS-CoV-2 infection  and should not be used as the sole basis for treatment or other  patient management decisions.  A negative result may occur with  improper specimen collection / handling, submission of specimen other  than nasopharyngeal swab, presence of viral mutation(s) within the  areas targeted by this assay, and inadequate number of viral copies  (<250 copies / mL). A negative result must be combined with clinical  observations, patient history, and epidemiological information. If result is POSITIVE SARS-CoV-2 target nucleic acids are DETECTED. The SARS-CoV-2 RNA is generally detectable in upper and lower  respiratory specimens dur  ing the acute phase of infection.  Positive  results are indicative of active infection with SARS-CoV-2.  Clinical  correlation with patient history and other diagnostic information is  necessary to determine patient infection status.  Positive results do  not rule out bacterial infection or co-infection with other viruses. If result is PRESUMPTIVE POSTIVE SARS-CoV-2 nucleic acids MAY BE PRESENT.   A presumptive positive result was obtained on the submitted specimen  and confirmed on repeat testing.  While 2019 novel coronavirus  (SARS-CoV-2) nucleic acids may be present in the submitted sample  additional confirmatory testing may be necessary for epidemiological  and / or clinical management purposes  to differentiate between  SARS-CoV-2 and other Sarbecovirus  currently known to infect humans.  If clinically indicated additional testing with an alternate test  methodology [phone]) is advised. The SARS-CoV-2 RNA is generally  detectable in upper and lower respiratory sp ecimens during the acute  phase of infection. The expected result is Negative. Fact Sheet for Patients:  StrictlyIdeas.no Fact Sheet for Healthcare Providers: BankingDealers.co.za This test is not yet approved or cleared by the [place] FDA and has been authorized for detection and/or diagnosis of SARS-CoV-2 by FDA under an Emergency Use Authorization (EUA).  This EUA will remain in effect (meaning this test can be used) for the duration of the COVID-19 declaration under Section 564(b)(1) of the Act, 21 U.S.C. section 360bbb-3(b)(1), unless the authorization is terminated or revoked sooner. Performed at [facility], [address] [place], [place] [address]   Body fluid culture     Status: None   Collection Time: 07/01/19 11:52 PM   Specimen: KNEE; Body Fluid  Result Value Ref Range Status   Specimen Description   Final    KNEE Performed at [facility], [address] [name], [place] [address]    Special Requests   Final    NONE Performed at [facility], [address] [place], [place] [address]    Gram Stain   Final    MODERATE WBC PRESENT, PREDOMINANTLY PMN NO ORGANISMS SEEN    Culture   Final    NO GROWTH 3 DAYS Performed at [facility], [place] [address] [place], [place] [address]    Report Status 07/05/2019 FINAL  Final    Medical History: Past Medical History:  Diagnosis Date  . AICD (automatic cardioverter/defibrillator) present   . AKI (acute kidney injury) ([place]) 12/24/2017  . Arrhythmia   . Arthritis    "lower back, knees" (06/10/2018)  . Cardiac arrest ([place]) 12/23/2017   Brief V-fib arrest  . Chest pain 09/08/2017  . CHF (congestive heart failure) ([facility])     nonischemic cardiomyopathy, EF 25%  . Gout    "on daily RX" (06/10/2018)  . Headache    "q couple months" (06/10/2018)  . High cholesterol   . History of kidney stones   . Hypertension   . Influenza A 12/24/2017  . NICM (nonischemic cardiomyopathy) ([place])   . Pneumonia 12/20/2017  . TIA (transient ischemic attack) 06/21/2016   "still affects my memory a little bit" (06/10/2018)    Medications:  Scheduled:  . allopurinol  200 mg Oral Daily  . amiodarone  200 mg Oral BID  . aspirin  81 mg Oral Daily  . atorvastatin  40 mg Oral q1800  . carvedilol  3.125 mg Oral BID WC  . Chlorhexidine Gluconate Cloth  6 each Topical Q0600  . clopidogrel  75 mg Oral Daily  .  feeding supplement (NEPRO CARB STEADY)  237 mL Oral BID BM  . gabapentin  100 mg Oral BID  . heparin injection (subcutaneous)  5,000 Units Subcutaneous Q8H  . ipratropium-albuterol  3 mL Nebulization Q6H  . magnesium oxide  400 mg Oral BID  . midodrine  10 mg Oral TID WC  . mometasone-formoterol  2 puff Inhalation BID  . multivitamin with minerals  1 tablet Oral Daily  . senna-docusate  1 tablet Oral Daily  . sodium chloride flush  3 mL Intravenous Q12H   Infusions:  . sodium chloride    . sodium chloride    . sodium chloride    . furosemide (LASIX) infusion    . pureflow 2,500 mL/hr at 07/05/19 1139    Assessment: Current medications do not appear to need adjustment for CRRT  Goal of Therapy:    Plan:  Will continue to follow   Stephen [name] A 07/05/2019,11:58 AM

## 2019-07-05 NOTE — Progress Notes (Signed)
Patient by nurse regarding patient's respiratory status deteriorating since return from his temporary dialysis catheter placement  Patient tachypneic and short of breath.  On nonrebreather.  Breathing 30 times a minute.  Awake and alert but anxious.  S1, S2 Lungs with bilateral diffuse crackles Mild lower extremity edema Abdomen soft and nontender Temporary dialysis catheter in place-right femoral  *Acute on chronic systolic congestive heart failure Patient is quickly deteriorating with increasing fluid overload.  Will place him on BiPAP and transferred to ICU.  Discussed with Dr. Lanney Gins.  Patient on Lasix drip.  Will need dialysis and will be started in half an hour. Poor prognosis. Discussed with cardiology Dr. Ubaldo Glassing who is contacting advanced heart failure team at [facility].  *Acute hypoxic respiratory failure.  Significant worsening in the last few hours.  BiPAP support in ICU.  Patient critically ill.  Critical care time spent 35 minutes in stabilizing patient and coordinating care and discussing with consultants.

## 2019-07-05 NOTE — Progress Notes (Signed)
Patient with severe nonischemic cardiomyopathy with an EF of 10 to 15% admitted with acute volume overload.  Long discussion with the patient regarding compliance.  He states he is compliant with medications and low-sodium diet his home.  Is currently transferred to the ICU on hemodialysis due to hypoxia and volume overload.  Would continue with current regimen.  Will discuss with advanced heart failure team at [facility] as he has an appoint with them in 2 weeks.  Appreciate ICU intensivist team assistance.

## 2019-07-05 NOTE — Progress Notes (Signed)
Patients o2 saturation noted at 93-95% on nonrebreather. Attempted to place him on bipap however, he declined. States he wants to stay on current mask. rr noted at 14. Will continue to monitor

## 2019-07-05 NOTE — Progress Notes (Signed)
[place] [facility] [place] NAME: Kyrian Stage    MR#:  [id number]  DATE OF BIRTH:  April 07, 1966  SUBJECTIVE:  CHIEF COMPLAINT:   Chief Complaint  Patient presents with  . Leg Swelling  . Shortness of Breath   On BiPAP today.  Continues to have shortness of breath. Had HD  REVIEW OF SYSTEMS:    Review of Systems  Constitutional: Positive for malaise/fatigue. Negative for chills and fever.  HENT: Negative for sore throat.   Eyes: Negative for blurred vision, double vision and pain.  Respiratory: Positive for shortness of breath. Negative for cough, hemoptysis and wheezing.   Cardiovascular: Positive for orthopnea. Negative for chest pain, palpitations and leg swelling.  Gastrointestinal: Negative for abdominal pain, constipation, diarrhea, heartburn, nausea and vomiting.  Genitourinary: Negative for dysuria and hematuria.  Musculoskeletal: Positive for joint pain. Negative for back pain.  Skin: Negative for rash.  Neurological: Negative for sensory change, speech change, focal weakness and headaches.  Endo/Heme/Allergies: Does not bruise/bleed easily.  Psychiatric/Behavioral: Negative for depression. The patient is not nervous/anxious.    DRUG ALLERGIES:   Allergies  Allergen Reactions  . Lisinopril Cough    VITALS:  Blood pressure 96/79, pulse 79, temperature 98.3 F (36.8 C), temperature source Oral, resp. rate (!) 21, height 6\' 2"  (1.88 m), weight 90.4 kg, SpO2 (!) 85 %.  PHYSICAL EXAMINATION:   Physical Exam  GENERAL:  53 y.o.-year-old patient lying in the bed with dyspnea. EYES: Pupils equal, round, reactive to light and accommodation. No scleral icterus. Extraocular muscles intact.  HEENT: Head atraumatic, normocephalic. Oropharynx and nasopharynx clear.  NECK:  Supple, no jugular venous distention. No thyroid enlargement, no tenderness.  LUNGS: Bilateral crackles. CARDIOVASCULAR: S1, S2 normal. No murmurs, rubs, or gallops.   ABDOMEN: Soft, nontender, nondistended. Bowel sounds present. No organomegaly or mass.  EXTREMITIES: No cyanosis, clubbing.  Lower extremity edema. NEUROLOGIC: Cranial nerves II through XII are intact. No focal Motor or sensory deficits b/l.   PSYCHIATRIC: The patient is alert and oriented x 3.  SKIN: No obvious rash, lesion, or ulcer.   LABORATORY PANEL:   CBC Recent Labs  Lab 07/05/19 0418  WBC 11.6*  HGB 10.3*  HCT 32.3*  PLT 414*   ------------------------------------------------------------------------------------------------------------------ Chemistries  Recent Labs  Lab 07/01/19 1710  07/05/19 0418  NA 136   < > 136  K 4.3   < > 5.2*  CL 95*   < > 96*  CO2 31   < > 28  GLUCOSE 98   < > 141*  BUN 41*   < > 56*  CREATININE 1.64*   < > 2.55*  CALCIUM 9.2   < > 9.0  MG  --   --  2.4  AST 29  --   --   ALT 55*  --   --   ALKPHOS 109  --   --   BILITOT 1.4*  --   --    < > = values in this interval not displayed.   ------------------------------------------------------------------------------------------------------------------  Cardiac Enzymes No results for input(s): TROPONINI in the last 168 hours. ------------------------------------------------------------------------------------------------------------------  RADIOLOGY:  Dg Chest Port 1 View  Result Date: 07/05/2019 CLINICAL DATA:  Congestive heart failure. Interstitial lung disease. EXAM: PORTABLE CHEST 1 VIEW COMPARISON:  07/03/2019 FINDINGS: Chronic cardiomegaly. Persistent and slight worsening diffuse pulmonary density which could be due to a combination of edema, pneumonia and ARDS. No visible pleural effusion. Pacemaker/AICD is grossly unchanged. IMPRESSION: Persistent and  possibly slightly worsening diffuse pulmonary density consistent with edema, pneumonia or ARDS. Electronically Signed   By: Nelson Chimes M.D.   On: 07/05/2019 08:51     ASSESSMENT AND PLAN:   * Acute on chronic systolic CHF with  acute hypoxic respiratory failure present on admission Did not respond to Lasix drip.  Started on hemodialysis.  Critically ill.  On BiPAP today.  Continue stepdown care. High risk for intubation.  * Left knee effusion --Status post left arthrocentesis in the emergency room with some immediate pain relief Has been a recurrent problem.  Osteoarthritis.  * AKi over CKD3 Due to cardiorenal syndrome On hemodialysis at this time Appreciate nephrology input  *Bilateral lower extremity neuropathy.  Started Neurontin.  * Generalized weakness with deconditioning - Physical therapy consulted for supportive care  All the records are reviewed and case discussed with Care Management/Social Worker Management plans discussed with the patient, family and they are in agreement.  CODE STATUS: FULL CODE  TOTAL  TIME TAKING CARE OF THIS PATIENT: 35 minutes.   POSSIBLE D/C IN 3-4 DAYS, DEPENDING ON CLINICAL CONDITION.  Leia Alf Bladyn Tipps M.D on 07/05/2019 at 11:55 AM  Between 7am to 6pm - Pager - [phone]  After 6pm go to [email or web address] - password [email or web address] [facility]  Office  [phone]  CC: Primary care physician; Valerie Roys, DO  Note: This dictation was prepared with Dragon dictation along with smaller phrase technology. Any transcriptional errors that result from this process are unintentional.

## 2019-07-05 NOTE — Assessment & Plan Note (Signed)
With frequent hospitalizations given severity of his disease. Awaiting heart transplant through [facility] currently. Currently symptomatic with SOB and leg edema b/l, but cannot increase diuretics given borderline hypotension. Patient wishes to go back to the hospital to be dialyzed as he states this was the best relief he's gotten lately. He has requested that we call him an ambulance as he does not have transportation. 911 called, patient taken to [facility] ED as requested.

## 2019-07-05 NOTE — Progress Notes (Signed)
CRITICAL CARE NOTE      CHIEF COMPLAINT:   Acute hypoxemic respiratory failure in context of florid pulmonary edema due to decompensated systolic CHF   SUBJECTIVE FINDINGS & SIGNIFICANT     -Patient remains critically ill currently on 70% FiO2 on BIPAP  -Have started HD hopefully will be able to wean from NIV soon -- -Discussed case at length with patient's sister [name] and answered all questions regarding care plan  PAST MEDICAL HISTORY   Past Medical History:  Diagnosis Date  . AICD (automatic cardioverter/defibrillator) present   . AKI (acute kidney injury) ([place]) 12/24/2017  . Arrhythmia   . Arthritis    "lower back, knees" (06/10/2018)  . Cardiac arrest ([place]) 12/23/2017   Brief V-fib arrest  . Chest pain 09/08/2017  . CHF (congestive heart failure) (H[facility])    nonischemic cardiomyopathy, EF 25%  . Gout    "on daily RX" (06/10/2018)  . Headache    "q couple months" (06/10/2018)  . High cholesterol   . History of kidney stones   . Hypertension   . Influenza A 12/24/2017  . NICM (nonischemic cardiomyopathy) ([place])   . Pneumonia 12/20/2017  . TIA (transient ischemic attack) 06/21/2016   "still affects my memory a little bit" (06/10/2018)     SURGICAL HISTORY   Past Surgical History:  Procedure Laterality Date  . EXTERNAL FIXATION LEG Right ~ 2000   "was going bowlegged; had to brake my leg to fix it"  . HERNIA REPAIR    . ICD IMPLANT N/A 12/30/2017   Procedure: ICD IMPLANT;  Surgeon: Deboraha Sprang, MD;  Location: [facility];  Service: Cardiovascular;  Laterality: N/A;  . LEFT HEART CATH AND CORONARY ANGIOGRAPHY N/A 09/09/2017   Procedure: LEFT HEART CATH AND CORONARY ANGIOGRAPHY;  Surgeon: Teodoro Spray, MD;  Location: [place] [facility];  Service: Cardiovascular;  Laterality: N/A;   . UMBILICAL HERNIA REPAIR  1990s  . V TACH ABLATION  06/10/2018  . V TACH ABLATION N/A 06/10/2018   Procedure: V TACH ABLATION;  Surgeon: Thompson Grayer, MD;  Location: [place] [facility] [facility];  Service: Cardiovascular;  Laterality: N/A;  . VASECTOMY       FAMILY HISTORY   Family History  Problem Relation Age of Onset  . Hypertension Mother   . Heart failure Mother   . Hypertension Father   . CAD Father   . Heart attack Father      SOCIAL HISTORY   Social History   Tobacco Use  . Smoking status: Former Smoker    Packs/day: 0.33    Years: 33.00    Pack years: 10.89    Types: Cigarettes    Quit date: 02/20/2016    Years since quitting: 3.3  . Smokeless tobacco: Never Used  Substance Use Topics  . Alcohol use: Not Currently    Alcohol/week: 0.0 standard drinks    Comment: 06/10/2018 "beer once/month; if that"  . Drug use: Never     MEDICATIONS   Current Medication:  Current Facility-Administered Medications:  .  0.9 %  sodium chloride infusion, 250 mL, Intravenous, PRN, Seals, Angela H, NP .  0.9 %  sodium chloride infusion, 100 mL, Intravenous, PRN, Lateef, Munsoor, MD .  0.9 %  sodium chloride infusion, 100 mL, Intravenous, PRN, Lateef, Munsoor, MD .  acetaminophen (TYLENOL) tablet 650 mg, 650 mg, Oral, Q4H PRN, Seals, Angela H, NP, 650 mg at 07/04/19 1703 .  allopurinol (ZYLOPRIM) tablet 200 mg, 200 mg, Oral,  Daily, Hillary Bow, MD, 200 mg at 07/04/19 0847 .  alteplase (CATHFLO ACTIVASE) injection 2 mg, 2 mg, Intracatheter, Once PRN, Lateef, Munsoor, MD .  amiodarone (PACERONE) tablet 200 mg, 200 mg, Oral, BID, Sudini, Srikar, MD, 200 mg at 07/04/19 2137 .  aspirin chewable tablet 81 mg, 81 mg, Oral, Daily, Sudini, Srikar, MD, 81 mg at 07/04/19 0849 .  atorvastatin (LIPITOR) tablet 40 mg, 40 mg, Oral, q1800, Sudini, Srikar, MD, 40 mg at 07/04/19 1702 .  carvedilol (COREG) tablet 3.125 mg, 3.125 mg, Oral, BID WC, Sudini, Srikar, MD, 3.125 mg at 07/04/19 1702 .   Chlorhexidine Gluconate Cloth 2 % PADS 6 each, 6 each, Topical, Q0600, Holley Raring, Munsoor, MD, 6 each at 07/04/19 1554 .  clopidogrel (PLAVIX) tablet 75 mg, 75 mg, Oral, Daily, Sudini, Srikar, MD, 75 mg at 07/04/19 1703 .  cyclobenzaprine (FLEXERIL) tablet 10 mg, 10 mg, Oral, QHS PRN, Hillary Bow, MD, 10 mg at 07/04/19 1118 .  feeding supplement (NEPRO CARB STEADY) liquid 237 mL, 237 mL, Oral, BID BM, Sudini, Srikar, MD, 237 mL at 07/03/19 1446 .  gabapentin (NEURONTIN) capsule 100 mg, 100 mg, Oral, BID, Sudini, Srikar, MD, 100 mg at 07/04/19 2137 .  guaiFENesin (ROBITUSSIN) 100 MG/5ML solution 100 mg, 5 mL, Oral, Q6H PRN, Hillary Bow, MD, 100 mg at 07/04/19 1808 .  heparin injection 1,000 Units, 1,000 Units, Dialysis, PRN, Lateef, Munsoor, MD .  heparin injection 5,000 Units, 5,000 Units, Subcutaneous, Q8H, Tukov-Yual, Magdalene S, NP .  ipratropium-albuterol (DUONEB) 0.5-2.5 (3) MG/3ML nebulizer solution 3 mL, 3 mL, Nebulization, Q6H, Tukov-Yual, Magdalene S, NP .  lidocaine (PF) (XYLOCAINE) 1 % injection 5 mL, 5 mL, Intradermal, PRN, Lateef, Munsoor, MD .  lidocaine-prilocaine (EMLA) cream 1 application, 1 application, Topical, PRN, Lateef, Munsoor, MD .  magnesium oxide (MAG-OX) tablet 400 mg, 400 mg, Oral, BID, Sudini, Srikar, MD, 400 mg at 07/04/19 2137 .  midodrine (PROAMATINE) tablet 10 mg, 10 mg, Oral, TID WC, Brittay Mogle, MD .  mometasone-formoterol [facility]) 200-5 MCG/ACT inhaler 2 puff, 2 puff, Inhalation, BID, Sudini, Srikar, MD, 2 puff at 07/04/19 2137 .  multivitamin with minerals tablet 1 tablet, 1 tablet, Oral, Daily, Sudini, Srikar, MD, 1 tablet at 07/04/19 0847 .  nitroGLYCERIN (NITROSTAT) SL tablet 0.4 mg, 0.4 mg, Sublingual, Q5 min PRN, Sudini, Srikar, MD .  ondansetron (ZOFRAN) injection 4 mg, 4 mg, Intravenous, Q6H PRN, Seals, Angela H, NP, 4 mg at 07/03/19 2026 .  pentafluoroprop-tetrafluoroeth (GEBAUERS) aerosol 1 application, 1 application, Topical, PRN, Lateef,  Munsoor, MD .  phenol (CHLORASEPTIC) mouth spray 1 spray, 1 spray, Mouth/Throat, PRN, Seals, Angela H, NP, 1 spray at 07/02/19 2234 .  senna-docusate (Senokot-S) tablet 1 tablet, 1 tablet, Oral, Daily, Sudini, Alveta Heimlich, MD, 1 tablet at 07/04/19 0848 .  sodium chloride flush (NS) 0.9 % injection 3 mL, 3 mL, Intravenous, Q12H, Seals, Angela H, NP, 3 mL at 07/04/19 2213 .  sodium chloride flush (NS) 0.9 % injection 3 mL, 3 mL, Intravenous, PRN, Seals, Theo Dills, NP    ALLERGIES   Lisinopril    REVIEW OF SYSTEMS     10 point ROS conducted is negative except as per subjective findings  PHYSICAL EXAMINATION   Vitals:   07/05/19 0500 07/05/19 0600  BP: 98/82 96/81  Pulse: 86 83  Resp: 20 (!) 21  Temp:    SpO2: 97% 99%    GENERAL: Age-appropriate patient in mild distress and chest discomfort  HEAD: Normocephalic, atraumatic.  EYES: Pupils equal, round, reactive  to light.  No scleral icterus.  MOUTH: Moist mucosal membrane. NECK: Supple. No thyromegaly. No nodules. No JVD.  PULMONARY: Bilateral crackles CARDIOVASCULAR: S1 and S2. Regular rate and rhythm. No murmurs, rubs, or gallops.  GASTROINTESTINAL: Soft, nontender, non-distended. No masses. Positive bowel sounds. No hepatosplenomegaly.  MUSCULOSKELETAL: Bilateral lower extremity edema NEUROLOGIC: Mild distress due to acute illness SKIN:intact,warm,dry   LABS AND IMAGING       LAB RESULTS: Recent Labs  Lab 07/03/19 2126 07/04/19 0417 07/05/19 0418  NA 135 136 136  K 5.3* 5.1 5.2*  CL 97* 97* 96*  CO2 24 26 28   BUN 48* 52* 56*  CREATININE 2.15* 2.40* 2.55*  GLUCOSE 142* 132* 141*   Recent Labs  Lab 07/01/19 1710 07/05/19 0418  HGB 12.0* 10.3*  HCT 37.0* 32.3*  WBC 13.7* 11.6*  PLT 547* 414*     IMAGING RESULTS: No results found.         ASSESSMENT AND PLAN    -Multidisciplinary rounds held today  Acute Hypoxic Respiratory Failure -due to advanced CHF with stage III renal failure -continue  Full NIV support- for HD today -CXR this am  - starting midodrine 10 TID to elevate BP and allow diuresis - possible CAP - resp culture with GPC and GPR - on Unasyn for now - wbc trending down   Acute decompensated systolic CHF with EF 44% -background of NICM s/p AICD PPM - on BIPAP - diuresis post improvement in BP currently borderline low - for HD today  ICU monitoring -1200cc fluid restrict  - strict I&O    Renal Failure- due to cardiorenal syndrome with concomitant chronic hypertensive nephropathy -Renal on case - appreciate input  - for HD today -follow UO -continue Foley Catheter-assess need daily   GI/Nutrition GI PROPHYLAXIS as indicated DIET-->TF's as tolerated Constipation protocol as indicated  ENDO - ICU hypoglycemic\Hyperglycemia protocol -check FSBS per protocol   ELECTROLYTES -follow labs as needed -replace as needed -pharmacy consultation   DVT/GI PRX ordered -SCDs  TRANSFUSIONS AS NEEDED MONITOR FSBS ASSESS the need for LABS as needed   Critical care provider statement:    Critical care time (minutes):  111   Critical care time was exclusive of:  Separately billable procedures and treating other patients   Critical care was necessary to treat or prevent imminent or life-threatening deterioration of the following conditions:   Acute hypoxemic respiratory failure, severe decompensated systolic CHF, acute on chronic renal insufficiency, possible pneumonia, multiple comorbid conditions   Critical care was time spent personally by me on the following activities:  Development of treatment plan with patient or surrogate, discussions with consultants, evaluation of patient's response to treatment, examination of patient, obtaining history from patient or surrogate, ordering and performing treatments and interventions, ordering and review of laboratory studies and re-evaluation of patient's condition.  I assumed direction of critical care for this patient  from another provider in my specialty: no    This document was prepared using Dragon voice recognition software and may include unintentional dictation errors.    Ottie Glazier, M.D.  Division of [place]

## 2019-07-05 NOTE — Progress Notes (Signed)
Resumed Bipap due to desaturation on HHFNC. Patient tolerating bipap well at this time.

## 2019-07-05 NOTE — Assessment & Plan Note (Signed)
Borderline hypotensive today, but asymptomatic. Patient requesting to go back to the hospital d/t his worsening SOB from acute on chronic HF. He was taken by ambulance to [facility] ED for monitoring

## 2019-07-05 NOTE — Progress Notes (Signed)
CRRT started

## 2019-07-05 NOTE — Progress Notes (Signed)
[facility]  ROUNDING NOTE   Subjective:  Patient with significant decompensation. Currently on BiPAP. Did not tolerate very much ultrafiltration yesterday with dialysis. Decision made to transition patient to CRRT today.  Objective:  Vital signs in last 24 hours:  Temp:  [98.2 F (36.8 C)-98.3 F (36.8 C)] 98.3 F (36.8 C) (07/05 1200) Pulse Rate:  [71-97] 71 (07/05 1341) Resp:  [17-39] 29 (07/05 1341) BP: (91-148)/(53-130) 92/77 (07/05 1300) SpO2:  [23 %-100 %] 99 % (07/05 1341) FiO2 (%):  [55 %] 55 % (07/05 1341) Weight:  [90.4 kg-96.3 kg] 90.4 kg (07/05 0419)  Weight change: -0.271 kg Filed Weights   07/04/19 1915 07/04/19 2147 07/05/19 0419  Weight: 96.3 kg 95.8 kg 90.4 kg    Intake/Output: I/O last 3 completed shifts: In: 253.6 [P.O.:220; I.V.:33.6] Out: 685 [Urine:185; Other:500]   Intake/Output this shift:  Total I/O In: -  Out: 114 [Urine:50; Other:64]  Physical Exam: General: Critically ill-appearing  Head: BiPAP facemask  Eyes: Anicteric  Neck: Supple, trachea midline  Lungs:  Bilateral rales, normal effort  Heart: Regular rate and rhythm  Abdomen:  Soft, nontender, BS present  Extremities: no peripheral edema.  Neurologic: Lethargic  Skin: No lesions       Basic Metabolic Panel: Recent Labs  Lab 07/03/19 0658 07/03/19 2126 07/04/19 0417 07/05/19 0418 07/05/19 1147  NA 137 135 136 136 136  K 3.9 5.3* 5.1 5.2* 5.1  CL 100 97* 97* 96* 98  CO2 25 24 26 28 26   GLUCOSE 120* 142* 132* 141* 129*  BUN 39* 48* 52* 56* 62*  CREATININE 1.63* 2.15* 2.40* 2.55* 2.70*  CALCIUM 8.9 9.2 9.1 9.0 8.9  MG  --   --   --  2.4  --   PHOS  --   --  4.1 4.9* 5.0*    Liver Function Tests: Recent Labs  Lab 07/01/19 1710 07/05/19 1147  AST 29  --   ALT 55*  --   ALKPHOS 109  --   BILITOT 1.4*  --   PROT 7.4  --   ALBUMIN 2.7* 2.4*   No results for input(s): LIPASE, AMYLASE in the last 168 hours. No results for input(s): AMMONIA in the  last 168 hours.  CBC: Recent Labs  Lab 07/01/19 1710 07/05/19 0418  WBC 13.7* 11.6*  HGB 12.0* 10.3*  HCT 37.0* 32.3*  MCV 86.9 87.5  PLT 547* 414*    Cardiac Enzymes: No results for input(s): CKTOTAL, CKMB, CKMBINDEX, TROPONINI in the last 168 hours.  BNP: Invalid input(s): POCBNP  CBG: Recent Labs  Lab 07/04/19 1422  GLUCAP 133*    Microbiology: Results for orders placed or performed during the hospital encounter of 07/01/19  SARS Coronavirus 2 (CEPHEID- Performed in [place] hospital lab), Hosp Order     Status: None   Collection Time: 07/01/19 10:54 PM   Specimen: Nasopharyngeal Swab  Result Value Ref Range Status   SARS Coronavirus 2 NEGATIVE NEGATIVE Final    Comment: (NOTE) If result is NEGATIVE SARS-CoV-2 target nucleic acids are NOT DETECTED. The SARS-CoV-2 RNA is generally detectable in upper and lower  respiratory specimens during the acute phase of infection. The lowest  concentration of SARS-CoV-2 viral copies this assay can detect is 250  copies / mL. A negative result does not preclude SARS-CoV-2 infection  and should not be used as the sole basis for treatment or other  patient management decisions.  A negative result may occur with  improper specimen  collection / handling, submission of specimen other  than nasopharyngeal swab, presence of viral mutation(s) within the  areas targeted by this assay, and inadequate number of viral copies  (<250 copies / mL). A negative result must be combined with clinical  observations, patient history, and epidemiological information. If result is POSITIVE SARS-CoV-2 target nucleic acids are DETECTED. The SARS-CoV-2 RNA is generally detectable in upper and lower  respiratory specimens dur ing the acute phase of infection.  Positive  results are indicative of active infection with SARS-CoV-2.  Clinical  correlation with patient history and other diagnostic information is  necessary to determine patient  infection status.  Positive results do  not rule out bacterial infection or co-infection with other viruses. If result is PRESUMPTIVE POSTIVE SARS-CoV-2 nucleic acids MAY BE PRESENT.   A presumptive positive result was obtained on the submitted specimen  and confirmed on repeat testing.  While 2019 novel coronavirus  (SARS-CoV-2) nucleic acids may be present in the submitted sample  additional confirmatory testing may be necessary for epidemiological  and / or clinical management purposes  to differentiate between  SARS-CoV-2 and other Sarbecovirus currently known to infect humans.  If clinically indicated additional testing with an alternate test  methodology [phone]) is advised. The SARS-CoV-2 RNA is generally  detectable in upper and lower respiratory sp ecimens during the acute  phase of infection. The expected result is Negative. Fact Sheet for Patients:  StrictlyIdeas.no Fact Sheet for Healthcare Providers: BankingDealers.co.za This test is not yet approved or cleared by the [place] FDA and has been authorized for detection and/or diagnosis of SARS-CoV-2 by FDA under an Emergency Use Authorization (EUA).  This EUA will remain in effect (meaning this test can be used) for the duration of the COVID-19 declaration under Section 564(b)(1) of the Act, 21 U.S.C. section 360bbb-3(b)(1), unless the authorization is terminated or revoked sooner. Performed at [facility], [address] [place], [place] [address]   Body fluid culture     Status: None   Collection Time: 07/01/19 11:52 PM   Specimen: KNEE; Body Fluid  Result Value Ref Range Status   Specimen Description   Final    KNEE Performed at [facility], [address] [place], [place] [address]    Special Requests   Final    NONE Performed at [facility], [address] [place], [place] [address]    Gram Stain   Final    MODERATE WBC  PRESENT, PREDOMINANTLY PMN NO ORGANISMS SEEN    Culture   Final    NO GROWTH 3 DAYS Performed at [facility], [place] [address], [place], [place] [address]    Report Status 07/05/2019 FINAL  Final    Coagulation Studies: No results for input(s): LABPROT, INR in the last 72 hours.  Urinalysis: No results for input(s): COLORURINE, LABSPEC, PHURINE, GLUCOSEU, HGBUR, BILIRUBINUR, KETONESUR, PROTEINUR, UROBILINOGEN, NITRITE, LEUKOCYTESUR in the last 72 hours.  Invalid input(s): APPERANCEUR    Imaging: Dg Chest Port 1 View  Result Date: 07/05/2019 CLINICAL DATA:  Congestive heart failure. Interstitial lung disease. EXAM: PORTABLE CHEST 1 VIEW COMPARISON:  07/03/2019 FINDINGS: Chronic cardiomegaly. Persistent and slight worsening diffuse pulmonary density which could be due to a combination of edema, pneumonia and ARDS. No visible pleural effusion. Pacemaker/AICD is grossly unchanged. IMPRESSION: Persistent and possibly slightly worsening diffuse pulmonary density consistent with edema, pneumonia or ARDS. Electronically Signed   By: Nelson Chimes M.D.   On: 07/05/2019 08:51  Medications:   . sodium chloride    . sodium chloride    . sodium chloride    . pureflow 2,500 mL/hr at 07/05/19 1139   . allopurinol  200 mg Oral Daily  . amiodarone  200 mg Oral BID  . aspirin  81 mg Oral Daily  . atorvastatin  40 mg Oral q1800  . carvedilol  3.125 mg Oral BID WC  . Chlorhexidine Gluconate Cloth  6 each Topical Q0600  . clopidogrel  75 mg Oral Daily  . feeding supplement (NEPRO CARB STEADY)  237 mL Oral BID BM  . gabapentin  100 mg Oral BID  . heparin injection (subcutaneous)  5,000 Units Subcutaneous Q8H  . ipratropium-albuterol  3 mL Nebulization Q6H  . magnesium oxide  400 mg Oral BID  . midodrine  10 mg Oral TID WC  . mometasone-formoterol  2 puff Inhalation BID  . multivitamin with minerals  1 tablet Oral Daily  . senna-docusate  1 tablet Oral Daily  . sodium chloride flush   3 mL Intravenous Q12H   sodium chloride, sodium chloride, sodium chloride, acetaminophen, alteplase, cyclobenzaprine, guaiFENesin, heparin, heparin, lidocaine (PF), lidocaine-prilocaine, nitroGLYCERIN, ondansetron (ZOFRAN) IV, pentafluoroprop-tetrafluoroeth, phenol, sodium chloride flush  Assessment/ Plan:  [name] is a 32 y.o. black male withchronic systolic heart failure ejection fraction 10-15%, prior history of cardiac arrest, hyperlipidemia, nephrolithiasis, hypertension, history of TIA, was admitted to [facility] on7/12/2018  for acute exacerbation of systolic congestive heart failure, EF 10%  1. Acute renal failure with hyperkalemia and metabolic acidosis onChronic kidney disease stage III baseline creatinine 1.69, GFR of 55. Appears to have cardiorenal syndrome. -During the last admission patient was temporarily trialed on hemodialysis and tolerated well.  As above he has developed recurrent cardiorenal syndrome.  Urine output was down to 185 cc over the preceding 24 hours.  We did perform another dialysis session yesterday however ultrafiltration achieved was only 685 cc.  Given instability we will proceed with CRRT with ultrafiltration target of 50 cc/h.  We will attempt to increase this as blood pressure tolerates.  2. Acute on chronic systolic heart failure: Patient continues to have frequent heart failure exacerbations given very low ejection fraction of 10%.  Question as to whether he will need LVAD.    LOS: 3 Dorna Mallet 7/5/20201:58 PM

## 2019-07-06 ENCOUNTER — Encounter: Payer: Self-pay | Admitting: Surgery

## 2019-07-06 DIAGNOSIS — Z515 Encounter for palliative care: Secondary | ICD-10-CM

## 2019-07-06 DIAGNOSIS — Z7189 Other specified counseling: Secondary | ICD-10-CM

## 2019-07-06 DIAGNOSIS — I5021 Acute systolic (congestive) heart failure: Secondary | ICD-10-CM

## 2019-07-06 LAB — RENAL FUNCTION PANEL
Albumin: 2.1 g/dL — ABNORMAL LOW (ref 3.5–5.0)
Albumin: 2.1 g/dL — ABNORMAL LOW (ref 3.5–5.0)
Albumin: 2.2 g/dL — ABNORMAL LOW (ref 3.5–5.0)
Albumin: 2.1 g/dL — ABNORMAL LOW (ref 3.5–5.0)
Anion gap: 7 (ref 5–15)
Anion gap: 8 (ref 5–15)
Anion gap: 9 (ref 5–15)
Anion gap: 9 (ref 5–15)
BUN: 34 mg/dL — ABNORMAL HIGH (ref 6–20)
BUN: 32 mg/dL — ABNORMAL HIGH (ref 6–20)
BUN: 28 mg/dL — ABNORMAL HIGH (ref 6–20)
BUN: 31 mg/dL — ABNORMAL HIGH (ref 6–20)
CO2: 28 mmol/L (ref 22–32)
CO2: 28 mmol/L (ref 22–32)
CO2: 27 mmol/L (ref 22–32)
CO2: 26 mmol/L (ref 22–32)
Calcium: 8.7 mg/dL — ABNORMAL LOW (ref 8.9–10.3)
Calcium: 8.9 mg/dL (ref 8.9–10.3)
Calcium: 9 mg/dL (ref 8.9–10.3)
Calcium: 9 mg/dL (ref 8.9–10.3)
Chloride: 99 mmol/L (ref 98–111)
Chloride: 102 mmol/L (ref 98–111)
Chloride: 101 mmol/L (ref 98–111)
Chloride: 101 mmol/L (ref 98–111)
Creatinine, Ser: 1.45 mg/dL — ABNORMAL HIGH (ref 0.61–1.24)
Creatinine, Ser: 1.57 mg/dL — ABNORMAL HIGH (ref 0.61–1.24)
Creatinine, Ser: 1.42 mg/dL — ABNORMAL HIGH (ref 0.61–1.24)
Creatinine, Ser: 1.57 mg/dL — ABNORMAL HIGH (ref 0.61–1.24)
GFR calc Af Amer: >60 mL/min (ref >60)
GFR calc Af Amer: 57 mL/min — ABNORMAL LOW (ref >60)
GFR calc Af Amer: >60 mL/min (ref >60)
GFR calc Af Amer: 57 mL/min — ABNORMAL LOW (ref >60)
GFR calc non Af Amer: 54 mL/min — ABNORMAL LOW (ref >60)
GFR calc non Af Amer: 49 mL/min — ABNORMAL LOW (ref >60)
GFR calc non Af Amer: 56 mL/min — ABNORMAL LOW (ref >60)
GFR calc non Af Amer: 49 mL/min — ABNORMAL LOW (ref >60)
Glucose, Bld: 115 mg/dL — ABNORMAL HIGH (ref 70–99)
Glucose, Bld: 125 mg/dL — ABNORMAL HIGH (ref 70–99)
Glucose, Bld: 130 mg/dL — ABNORMAL HIGH (ref 70–99)
Glucose, Bld: 132 mg/dL — ABNORMAL HIGH (ref 70–99)
Phosphorus: 2.5 mg/dL (ref 2.5–4.6)
Phosphorus: 2.5 mg/dL (ref 2.5–4.6)
Phosphorus: 2.1 mg/dL — ABNORMAL LOW (ref 2.5–4.6)
Phosphorus: 2 mg/dL — ABNORMAL LOW (ref 2.5–4.6)
Potassium: 4.5 mmol/L (ref 3.5–5.1)
Potassium: 4.8 mmol/L (ref 3.5–5.1)
Potassium: 5 mmol/L (ref 3.5–5.1)
Potassium: 4.9 mmol/L (ref 3.5–5.1)
Sodium: 134 mmol/L — ABNORMAL LOW (ref 135–145)
Sodium: 138 mmol/L (ref 135–145)
Sodium: 137 mmol/L (ref 135–145)
Sodium: 136 mmol/L (ref 135–145)

## 2019-07-06 LAB — CBC
HCT: 32.6 % — ABNORMAL LOW (ref 39.0–52.0)
Hemoglobin: 10.1 g/dL — ABNORMAL LOW (ref 13.0–17.0)
MCH: 27.7 pg (ref 26.0–34.0)
MCHC: 31 g/dL (ref 30.0–36.0)
MCV: 89.3 fL (ref 80.0–100.0)
Platelets: 375 10*3/uL (ref 150–400)
RBC: 3.65 MIL/uL — ABNORMAL LOW (ref 4.22–5.81)
RDW: 17.2 % — ABNORMAL HIGH (ref 11.5–15.5)
WBC: 8.5 10*3/uL (ref 4.0–10.5)
nRBC: 0.5 % — ABNORMAL HIGH (ref 0.0–0.2)

## 2019-07-06 LAB — MAGNESIUM
Magnesium: 1.9 mg/dL (ref 1.7–2.4)
Magnesium: 1.9 mg/dL (ref 1.7–2.4)
Magnesium: 2 mg/dL (ref 1.7–2.4)
Magnesium: 0.7 mg/dL — CL (ref 1.7–2.4)
Magnesium: 2.1 mg/dL (ref 1.7–2.4)
Magnesium: 2 mg/dL (ref 1.7–2.4)

## 2019-07-06 LAB — QUANTIFERON-TB GOLD PLUS: QuantiFERON-TB Gold Plus: UNDETERMINED — AB

## 2019-07-06 LAB — QUANTIFERON-TB GOLD PLUS (RQFGPL)
QuantiFERON Mitogen Value: 0.08 IU/mL
QuantiFERON Nil Value: 0.02 IU/mL
QuantiFERON TB1 Ag Value: 0.03 IU/mL
QuantiFERON TB2 Ag Value: 0.03 IU/mL

## 2019-07-06 MED ORDER — LIDOCAINE HCL (PF) 1 % IJ SOLN: 5.0000 mL | INTRAMUSCULAR | 0 refills | Status: AC | PRN

## 2019-07-06 MED ORDER — SODIUM CHLORIDE 0.9 % IV SOLN: 250.0000 mL | INTRAVENOUS | 0 refills | Status: AC | PRN

## 2019-07-06 MED ORDER — HEPARIN (PORCINE) 25000 UT/250ML-% IV SOLN
300.0000 [IU]/h | INTRAVENOUS | Status: DC
  Administered 2019-07-06: 300 [IU]/h via INTRAVENOUS

## 2019-07-06 MED ORDER — LIDOCAINE HCL (PF) 1 % IJ SOLN
INTRAMUSCULAR | Status: DC | PRN
  Administered 2019-07-04: 5 mL via INTRADERMAL

## 2019-07-06 MED ORDER — ALTEPLASE 2 MG IJ SOLR: 2.0000 mg | Freq: Once | INTRAMUSCULAR | Status: AC | PRN

## 2019-07-06 MED ORDER — ACETAMINOPHEN 325 MG PO TABS: 650.0000 mg | ORAL_TABLET | ORAL | Status: AC | PRN

## 2019-07-06 MED ORDER — SODIUM CHLORIDE 0.9 % IV SOLN: 100.0000 mL | INTRAVENOUS | 0 refills | Status: AC | PRN

## 2019-07-06 MED ORDER — LIDOCAINE-PRILOCAINE 2.5-2.5 % EX CREA: 1.0000 "application " | TOPICAL_CREAM | CUTANEOUS | 0 refills | Status: AC | PRN

## 2019-07-06 MED ORDER — CHLORHEXIDINE GLUCONATE CLOTH 2 % EX PADS: 6.0000 | MEDICATED_PAD | Freq: Every day | CUTANEOUS | Status: AC

## 2019-07-06 MED ORDER — NEPRO/CARBSTEADY PO LIQD: 237.0000 mL | Freq: Two times a day (BID) | ORAL | 0 refills | Status: AC

## 2019-07-06 MED ORDER — SODIUM CHLORIDE 0.9% FLUSH: 3.0000 mL | Freq: Two times a day (BID) | INTRAVENOUS | Status: AC

## 2019-07-06 MED ORDER — ONDANSETRON HCL 4 MG/2ML IJ SOLN: 4.0000 mg | Freq: Four times a day (QID) | INTRAMUSCULAR | 0 refills | Status: AC | PRN

## 2019-07-06 MED ORDER — CYCLOBENZAPRINE HCL 10 MG PO TABS: 10.0000 mg | ORAL_TABLET | Freq: Every evening | ORAL | 0 refills | Status: AC | PRN

## 2019-07-06 MED ORDER — HEPARIN SODIUM (PORCINE) 1000 UNIT/ML DIALYSIS: 1000.0000 [IU] | INTRAMUSCULAR | Status: AC | PRN

## 2019-07-06 MED ORDER — NITROGLYCERIN 0.4 MG SL SUBL: 0.4000 mg | SUBLINGUAL_TABLET | SUBLINGUAL | 12 refills | Status: AC | PRN

## 2019-07-06 MED ORDER — CLOPIDOGREL BISULFATE 75 MG PO TABS: 75.0000 mg | ORAL_TABLET | Freq: Every day | ORAL | Status: AC

## 2019-07-06 MED ORDER — MENTHOL 3 MG MT LOZG
1.0000 | LOZENGE | OROMUCOSAL | Status: DC | PRN
  Filled 2019-07-06 (×2): qty 9

## 2019-07-06 MED ORDER — HEPARIN (PORCINE) 25000 UT/250ML-% IV SOLN
INTRAVENOUS | Status: AC
  Administered 2019-07-06: 300 [IU]/h via INTRAVENOUS
  Filled 2019-07-06: qty 250

## 2019-07-06 MED ORDER — PHENOL 1.4 % MT LIQD: 1.0000 | OROMUCOSAL | 0 refills | Status: AC | PRN

## 2019-07-06 MED ORDER — GUAIFENESIN 100 MG/5ML PO SOLN: 5.0000 mL | Freq: Four times a day (QID) | ORAL | 0 refills | Status: AC | PRN

## 2019-07-06 MED ORDER — FUROSEMIDE 10 MG/ML IJ SOLN: 40.0000 mg | Freq: Once | INTRAMUSCULAR | 0 refills | Status: AC

## 2019-07-06 MED ORDER — MAGNESIUM OXIDE 400 (241.3 MG) MG PO TABS: 400.0000 mg | ORAL_TABLET | Freq: Two times a day (BID) | ORAL | Status: AC

## 2019-07-06 MED ORDER — STERILE WATER FOR INJECTION IJ SOLN
INTRAMUSCULAR | Status: AC
  Administered 2019-07-07
  Filled 2019-07-06: qty 10

## 2019-07-06 MED ORDER — MENTHOL 3 MG MT LOZG: 1.0000 | LOZENGE | OROMUCOSAL | 12 refills | Status: AC | PRN

## 2019-07-06 MED ORDER — GABAPENTIN 100 MG PO CAPS: 100.0000 mg | ORAL_CAPSULE | Freq: Two times a day (BID) | ORAL | Status: AC

## 2019-07-06 MED ORDER — ADULT MULTIVITAMIN W/MINERALS CH: 1.0000 | ORAL_TABLET | Freq: Every day | ORAL | Status: AC

## 2019-07-06 MED ORDER — SODIUM CHLORIDE 0.9% FLUSH: 3.0000 mL | INTRAVENOUS | Status: AC | PRN

## 2019-07-06 MED ORDER — MIDODRINE HCL 10 MG PO TABS: 10.0000 mg | ORAL_TABLET | Freq: Three times a day (TID) | ORAL | Status: AC

## 2019-07-06 MED ORDER — SODIUM CHLORIDE 0.9 % IV SOLN
3.0000 g | Freq: Three times a day (TID) | INTRAVENOUS | Status: DC
  Filled 2019-07-06 (×3): qty 8

## 2019-07-06 MED ORDER — BLISTEX MEDICATED EX OINT
TOPICAL_OINTMENT | CUTANEOUS | Status: DC | PRN
  Filled 2019-07-06: qty 6.3

## 2019-07-06 MED ORDER — IPRATROPIUM-ALBUTEROL 0.5-2.5 (3) MG/3ML IN SOLN: 3.0000 mL | Freq: Four times a day (QID) | RESPIRATORY_TRACT | Status: AC

## 2019-07-06 MED ORDER — HEPARIN (PORCINE) 25000 UT/250ML-% IV SOLN: 300.0000 [IU]/h | INTRAVENOUS | Status: AC

## 2019-07-06 MED ORDER — HEPARIN SODIUM (PORCINE) 5000 UNIT/ML IJ SOLN: 5000.0000 [IU] | Freq: Three times a day (TID) | INTRAMUSCULAR | Status: AC

## 2019-07-06 MED ORDER — PENTAFLUOROPROP-TETRAFLUOROETH EX AERO: 1.0000 "application " | INHALATION_SPRAY | CUTANEOUS | 0 refills | Status: AC | PRN

## 2019-07-06 NOTE — Progress Notes (Signed)
[facility]  ROUNDING NOTE   Subjective:   Placed on BIPAP  CRRT. UF of 914mL.   Objective:  Vital signs in last 24 hours:  Temp:  [97.8 F (36.6 C)-98.4 F (36.9 C)] 97.8 F (36.6 C) (07/06 0800) Pulse Rate:  [71-81] 81 (07/06 0800) Resp:  [17-32] 22 (07/06 0800) BP: (90-106)/(58-86) 96/73 (07/06 0800) SpO2:  [85 %-100 %] 98 % (07/06 0800) FiO2 (%):  [40 %-55 %] 55 % (07/05 1454) Weight:  [89.5 kg] 89.5 kg (07/06 0450)  Weight change: -6.8 kg Filed Weights   07/04/19 2147 07/05/19 0419 07/06/19 0450  Weight: 95.8 kg 90.4 kg 89.5 kg    Intake/Output: I/O last 3 completed shifts: In: 460 [P.O.:360; IV Piggyback:100] Out: 8101 [Urine:310; BPZWC:5852]   Intake/Output this shift:  No intake/output data recorded.  Physical Exam: General: Critically Ill  Head: Normocephalic, atraumatic. Moist oral mucosal membranes  Eyes: Anicteric, PERRL  Neck: Supple, trachea midline  Lungs:  Bilateral crackles, BIPAP  Heart: Regular rate and rhythm  Abdomen:  Soft, nontender,   Extremities: no peripheral edema.  Neurologic: Nonfocal, moving all four extremities  Skin: No lesions  Access: Right femoral temp HD catheter, Dr. Trula Slade 7/4    Basic Metabolic Panel: Recent Labs  Lab 07/05/19 1147 07/05/19 1509 07/05/19 1807 07/05/19 2217 07/06/19 0254 07/06/19 0740  NA 136 135 135 135 134* 138  K 5.1 4.8 4.6 4.7 4.5 4.8  CL 98 100 98 98 99 102  CO2 26 24 27 27 28 28   GLUCOSE 129* 127* 133* 117* 115* 125*  BUN 62* 54* 49* 41* 34* 32*  CREATININE 2.70* 2.20* 1.90* 1.68* 1.45* 1.57*  CALCIUM 8.9 9.1 8.9 9.0 8.7* 8.9  MG  --  2.2 2.1 2.0 1.9 1.9  PHOS 5.0* 4.1  --  2.8 2.5 2.5    Liver Function Tests: Recent Labs  Lab 07/01/19 1710 07/05/19 1147 07/05/19 1509 07/05/19 2217 07/06/19 0254 07/06/19 0740  AST 29  --   --   --   --   --   ALT 55*  --   --   --   --   --   ALKPHOS 109  --   --   --   --   --   BILITOT 1.4*  --   --   --   --   --   PROT  7.4  --   --   --   --   --   ALBUMIN 2.7* 2.4* 2.4* 2.3* 2.1* 2.1*   No results for input(s): LIPASE, AMYLASE in the last 168 hours. No results for input(s): AMMONIA in the last 168 hours.  CBC: Recent Labs  Lab 07/01/19 1710 07/05/19 0418 07/06/19 0523  WBC 13.7* 11.6* 8.5  HGB 12.0* 10.3* 10.1*  HCT 37.0* 32.3* 32.6*  MCV 86.9 87.5 89.3  PLT 547* 414* 375    Cardiac Enzymes: No results for input(s): CKTOTAL, CKMB, CKMBINDEX, TROPONINI in the last 168 hours.  BNP: Invalid input(s): POCBNP  CBG: Recent Labs  Lab 07/04/19 1422  GLUCAP 133*    Microbiology: Results for orders placed or performed during the hospital encounter of 07/01/19  SARS Coronavirus 2 (CEPHEID- Performed in [facility] hospital lab), Hosp Order     Status: None   Collection Time: 07/01/19 10:54 PM   Specimen: Nasopharyngeal Swab  Result Value Ref Range Status   SARS Coronavirus 2 NEGATIVE NEGATIVE Final    Comment: (NOTE) If result is NEGATIVE SARS-CoV-2  target nucleic acids are NOT DETECTED. The SARS-CoV-2 RNA is generally detectable in upper and lower  respiratory specimens during the acute phase of infection. The lowest  concentration of SARS-CoV-2 viral copies this assay can detect is 250  copies / mL. A negative result does not preclude SARS-CoV-2 infection  and should not be used as the sole basis for treatment or other  patient management decisions.  A negative result may occur with  improper specimen collection / handling, submission of specimen other  than nasopharyngeal swab, presence of viral mutation(s) within the  areas targeted by this assay, and inadequate number of viral copies  (<250 copies / mL). A negative result must be combined with clinical  observations, patient history, and epidemiological information. If result is POSITIVE SARS-CoV-2 target nucleic acids are DETECTED. The SARS-CoV-2 RNA is generally detectable in upper and lower  respiratory specimens dur ing the  acute phase of infection.  Positive  results are indicative of active infection with SARS-CoV-2.  Clinical  correlation with patient history and other diagnostic information is  necessary to determine patient infection status.  Positive results do  not rule out bacterial infection or co-infection with other viruses. If result is PRESUMPTIVE POSTIVE SARS-CoV-2 nucleic acids MAY BE PRESENT.   A presumptive positive result was obtained on the submitted specimen  and confirmed on repeat testing.  While 2019 novel coronavirus  (SARS-CoV-2) nucleic acids may be present in the submitted sample  additional confirmatory testing may be necessary for epidemiological  and / or clinical management purposes  to differentiate between  SARS-CoV-2 and other Sarbecovirus currently known to infect humans.  If clinically indicated additional testing with an alternate test  methodology [phone]) is advised. The SARS-CoV-2 RNA is generally  detectable in upper and lower respiratory sp ecimens during the acute  phase of infection. The expected result is Negative. Fact Sheet for Patients:  StrictlyIdeas.no Fact Sheet for Healthcare Providers: BankingDealers.co.za This test is not yet approved or cleared by the [place] FDA and has been authorized for detection and/or diagnosis of SARS-CoV-2 by FDA under an Emergency Use Authorization (EUA).  This EUA will remain in effect (meaning this test can be used) for the duration of the COVID-19 declaration under Section 564(b)(1) of the Act, 21 U.S.C. section 360bbb-3(b)(1), unless the authorization is terminated or revoked sooner. Performed at [facility], [address] [place], [place] [address]   Body fluid culture     Status: None   Collection Time: 07/01/19 11:52 PM   Specimen: KNEE; Body Fluid  Result Value Ref Range Status   Specimen Description   Final    KNEE Performed at [facility], [address], [place], [place] [address]    Special Requests   Final    NONE Performed at [facility], [address] [place], [place] [address]    Gram Stain   Final    MODERATE WBC PRESENT, PREDOMINANTLY PMN NO ORGANISMS SEEN    Culture   Final    NO GROWTH 3 DAYS Performed at [facility], [place] [address] [place],  [address]    Report Status 07/05/2019 FINAL  Final  CULTURE, BLOOD (ROUTINE X 2) w Reflex to ID Panel     Status: None (Preliminary result)   Collection Time: 07/05/19  6:07 PM   Specimen: BLOOD  Result Value Ref Range Status   Specimen Description BLOOD BLOOD RIGHT ARM  Final   Special Requests   Final  BOTTLES DRAWN AEROBIC AND ANAEROBIC Blood Culture adequate volume   Culture   Final    NO GROWTH < 24 HOURS Performed at [facility], [address] [place], [place] [address]    Report Status PENDING  Incomplete  CULTURE, BLOOD (ROUTINE X 2) w Reflex to ID Panel     Status: None (Preliminary result)   Collection Time: 07/05/19  6:10 PM   Specimen: BLOOD  Result Value Ref Range Status   Specimen Description BLOOD PORTA CATH  Final   Special Requests   Final    BOTTLES DRAWN AEROBIC AND ANAEROBIC Blood Culture adequate volume   Culture   Final    NO GROWTH < 24 HOURS Performed at [facility], [address] [place], [place] [address]    Report Status PENDING  Incomplete    Coagulation Studies: No results for input(s): LABPROT, INR in the last 72 hours.  Urinalysis: No results for input(s): COLORURINE, LABSPEC, PHURINE, GLUCOSEU, HGBUR, BILIRUBINUR, KETONESUR, PROTEINUR, UROBILINOGEN, NITRITE, LEUKOCYTESUR in the last 72 hours.  Invalid input(s): APPERANCEUR    Imaging: Dg Chest Port 1 View  Result Date: 07/05/2019 CLINICAL DATA:  Congestive heart failure. Interstitial lung disease. EXAM: PORTABLE CHEST 1 VIEW COMPARISON:  07/03/2019 FINDINGS: Chronic cardiomegaly. Persistent and slight  worsening diffuse pulmonary density which could be due to a combination of edema, pneumonia and ARDS. No visible pleural effusion. Pacemaker/AICD is grossly unchanged. IMPRESSION: Persistent and possibly slightly worsening diffuse pulmonary density consistent with edema, pneumonia or ARDS. Electronically Signed   By: Nelson Chimes M.D.   On: 07/05/2019 08:51     Medications:   . sodium chloride    . sodium chloride    . sodium chloride    . pureflow 1,500 mL (07/06/19 0555)   . allopurinol  200 mg Oral Daily  . amiodarone  200 mg Oral BID  . aspirin  81 mg Oral Daily  . atorvastatin  40 mg Oral q1800  . carvedilol  3.125 mg Oral BID WC  . Chlorhexidine Gluconate Cloth  6 each Topical Q0600  . clopidogrel  75 mg Oral Daily  . feeding supplement (NEPRO CARB STEADY)  237 mL Oral BID BM  . furosemide  40 mg Intravenous Once  . gabapentin  100 mg Oral BID  . heparin injection (subcutaneous)  5,000 Units Subcutaneous Q8H  . ipratropium-albuterol  3 mL Nebulization Q6H  . magnesium oxide  400 mg Oral BID  . midodrine  10 mg Oral TID WC  . multivitamin with minerals  1 tablet Oral Daily  . senna-docusate  1 tablet Oral Daily  . sodium chloride flush  3 mL Intravenous Q12H   sodium chloride, sodium chloride, sodium chloride, acetaminophen, alteplase, cyclobenzaprine, guaiFENesin, heparin, heparin, lidocaine (PF), lidocaine-prilocaine, lip balm, nitroGLYCERIN, ondansetron (ZOFRAN) IV, pentafluoroprop-tetrafluoroeth, phenol, sodium chloride flush  Assessment/ Plan:  [name] is a 53 y.o. black male withchronic systolic heart failure ejection fraction 10-15%, prior history of cardiac arrest, hyperlipidemia, nephrolithiasis, hypertension, history of TIA, was admitted to [facility] on7/12/2018 for acute exacerbation of systolic congestive heart failure   Placed on renal replacement therapy on 7/4. Intermittent hemodialysis with UF of 561mL on 7/4. Placed on CRRT yesterday, 7/5. UF of  979mL.  Pending transfer to [facility].   1. Acute renal failure with hyperkalemia and metabolic acidosis onChronic kidney disease stage III  Required hemodialysis on last admission Baseline creatinine 1.94, GFR of 38 on 05/30/19.  Chronic Kidney disease secondary to hypertension Acute  renal failure secondary to acute cardiorenal syndrome.  - Continue CRRT - Increase ultrafiltration to 140mL/hr   2. Acute on chronic systolic heart failure 3. Acute Respiratory Failure: requiring BIPAP   LOS: 4 Romond Pipkins 7/6/20209:50 AM

## 2019-07-06 NOTE — Progress Notes (Signed)
He remains on BiPAP and CRRT.  Last chest film 7/5 continues to demonstrate cardiomegaly with pulmonary edema.  I have discontinued Unasyn as I see no evidence of pneumonia.  He awaits transfer to [facility].  This should happen today.  Continue her current level of care.  Merton Border, MD PCCM service Mobile [phone] Pager [phone] 07/06/2019 1:46 PM

## 2019-07-06 NOTE — Progress Notes (Signed)
CRRT restarted at 1515. Access pressures low at 1532, blood flow decreased to 250.

## 2019-07-06 NOTE — Progress Notes (Signed)
Access pressures low and balance chamber pressure high. Decreased blood flow to 250.

## 2019-07-06 NOTE — Consult Note (Signed)
Consultation Note Date: 07/06/2019   Patient Name: Stephen [name]  DOB: March 21, 1966  MRN: [id number]  Age / Sex: 53 y.o., male  PCP: Valerie Roys, DO Referring Physician: Bettey Costa, MD  Reason for Consultation: Establishing goals of care  HPI/Patient Profile: 53 y.o. male  with past medical history of CHF with nonischemic cardiomyopathy and ejection fraction 10 to 15%, history of gout, hypertension, hyperlipidemia admitted on 07/01/2019 with acute hypoxic respiratory failure due to acute on chronic/systolic heart failure, severe ischemic cardiomyopathy with an EF of 10 to 15%, acute on chronic kidney disease related to cardiorenal syndrome on CRRT now..   Clinical Assessment and Goals of Care: I have reviewed medical records including EPIC notes, labs and imaging, received report from bedside nursing, assessed the patient and then met at the bedside to discuss diagnosis prognosis, GOC, EOL wishes, disposition and options.  I introduced Palliative Medicine as specialized medical care for people living with serious illness. It focuses on providing relief from the symptoms and stress of a serious illness. The goal is to improve quality of life for both the patient and the family.  As far as functional and nutritional status functional status is diminished due to low EF.  We discussed her current illness and what it means in the larger context of her on-going co-morbidities.  Natural disease trajectory and expectations at EOL were discussed.  Advanced directives, concepts specific to code status, were considered and discussed.  Healthcare power of attorney discussed, see below.  CODE STATUS discussed, see below.   Mr. Lacko ok's for me to call his sister Stephen [name] for update. Call to Mrs. Stephen [name] at [phone].  We talk about [name], acute health concerns and possible transfer to [facility] medical  center. Low EF, CRRT, fluid build up, CKD.  He has been low at 10% over a year.  We talk about cardiod/renal syndrome.   We talk about his wish for life support. We talk about hospice care, i from [facility] and at home (not his focus of care at this time).   We talk about how to make choices for others. 1).  Keeping them at the center of decision-making, 2) are we doing something for them or to them, can we change what is happening 3) the person to him was 10 years ago, in his health how would he want [place] to care for him now. Mrs. Stephen [name] shares that team is her only brother, their father died in 5 at age 52 with heart disease.  Their mother died a few years ago.  She shares a story of staying with grandparents as young children, that Stephen [name] was 61-year-old and she was 4 and he would call for her to change his diapers.  She talks about her care, responsibility, and her love for her brother.  Mrs. Stephen [name] shares her faith.   Questions and concerns were addressed.    The family was encouraged to call with questions or concerns.    PMT to follow  up tomorrow.    HCPOA    NEXT OF KIN -[name] names his Sister Stephen [name] as his Ambulance person.  He shares that she knows what he does and does not want.    SUMMARY OF RECOMMENDATIONS   Full code/full scope Would accept long-term life support including trach  Code Status/Advance Care Planning:  Full code -Mr. Burgard tells me that he would want CPR and intubation.  "Bring me back".  I share that we would "try", and he repeats try.  He initially tells me that he could not be intubated because of his weak heart.  He tells me that he would want life support for an extended amount of time, even accepting a tracheotomy if necessary.  Symptom Management:   Per hospitalist, no additional needs at this time.  Palliative Prophylaxis:   Frequent Pain Assessment and Turn Reposition  Additional Recommendations (Limitations,  Scope, Preferences):  Full Scope Treatment  Psycho-social/Spiritual:   Desire for further Chaplaincy support:no  Additional Recommendations: Caregiving  Support/Resources  Prognosis:   Unable to determine, 6 months or less would not be surprising based on EF of 10%.  Complicated by relatively young age.  Discharge Planning: Working on transfer to [facility].      Primary Diagnoses: Present on Admission: **None**   I have reviewed the medical record, interviewed the patient and family, and examined the patient. The following aspects are pertinent.  Past Medical History:  Diagnosis Date  . AICD (automatic cardioverter/defibrillator) present   . AKI (acute kidney injury) ([place]) 12/24/2017  . Arrhythmia   . Arthritis    "lower back, knees" (06/10/2018)  . Cardiac arrest ([place]) 12/23/2017   Brief V-fib arrest  . Chest pain 09/08/2017  . CHF (congestive heart failure) (HCC)    nonischemic cardiomyopathy, EF 25%  . Gout    "on daily RX" (06/10/2018)  . Headache    "q couple months" (06/10/2018)  . High cholesterol   . History of kidney stones   . Hypertension   . Influenza A 12/24/2017  . NICM (nonischemic cardiomyopathy) ([place])   . Pneumonia 12/20/2017  . TIA (transient ischemic attack) 06/21/2016   "still affects my memory a little bit" (06/10/2018)   Social History   Socioeconomic History  . Marital status: Single    Spouse name: Not on file  . Number of children: Not on file  . Years of education: Not on file  . Highest education level: Not on file  Occupational History  . Not on file  Social Needs  . Financial resource strain: Not on file  . Food insecurity    Worry: Not on file    Inability: Not on file  . Transportation needs    Medical: No    Non-medical: No  Tobacco Use  . Smoking status: Former Smoker    Packs/day: 0.33    Years: 33.00    Pack years: 10.89    Types: Cigarettes    Quit date: 02/20/2016    Years since quitting: 3.3   . Smokeless tobacco: Never Used  Substance and Sexual Activity  . Alcohol use: Not Currently    Alcohol/week: 0.0 standard drinks    Comment: 06/10/2018 "beer once/month; if that"  . Drug use: Never  . Sexual activity: Yes  Lifestyle  . Physical activity    Days per week: 2 days    Minutes per session: Not on file  . Stress: Not at all  Relationships  . Social connections  Talks on phone: Not on file    Gets together: Not on file    Attends religious service: Not on file    Active member of club or organization: Not on file    Attends meetings of clubs or organizations: Not on file    Relationship status: Not on file  Other Topics Concern  . Not on file  Social History Narrative   Independent at baseline, ambulates steadily   Family History  Problem Relation Age of Onset  . Hypertension Mother   . Heart failure Mother   . Hypertension Father   . CAD Father   . Heart attack Father    Scheduled Meds: . allopurinol  200 mg Oral Daily  . amiodarone  200 mg Oral BID  . aspirin  81 mg Oral Daily  . atorvastatin  40 mg Oral q1800  . Chlorhexidine Gluconate Cloth  6 each Topical Q0600  . clopidogrel  75 mg Oral Daily  . feeding supplement (NEPRO CARB STEADY)  237 mL Oral BID BM  . furosemide  40 mg Intravenous Once  . gabapentin  100 mg Oral BID  . heparin injection (subcutaneous)  5,000 Units Subcutaneous Q8H  . ipratropium-albuterol  3 mL Nebulization Q6H  . magnesium oxide  400 mg Oral BID  . midodrine  10 mg Oral TID WC  . multivitamin with minerals  1 tablet Oral Daily  . senna-docusate  1 tablet Oral Daily  . sodium chloride flush  3 mL Intravenous Q12H   Continuous Infusions: . sodium chloride    . sodium chloride    . sodium chloride    . pureflow 1,500 mL (07/06/19 0555)   PRN Meds:.sodium chloride, sodium chloride, sodium chloride, acetaminophen, alteplase, cyclobenzaprine, guaiFENesin, heparin, heparin, lidocaine (PF), lidocaine-prilocaine, lip balm,  nitroGLYCERIN, ondansetron (ZOFRAN) IV, pentafluoroprop-tetrafluoroeth, phenol, sodium chloride flush Medications Prior to Admission:  Prior to Admission medications   Medication Sig Start Date End Date Taking? Authorizing Provider  acetaminophen (TYLENOL) 325 MG tablet Take 2 tablets (650 mg total) by mouth every 6 (six) hours as needed. Patient taking differently: Take 325-650 mg by mouth.  03/19/18  Yes Carrie Mew, MD  albuterol (PROVENTIL HFA;VENTOLIN HFA) 108 (90 Base) MCG/ACT inhaler Inhale 2 puffs into the lungs every 4 (four) hours as needed for wheezing or shortness of breath. 09/19/18  Yes Johnson, Megan P, DO  allopurinol (ZYLOPRIM) 100 MG tablet Take 2 tablets (200 mg total) by mouth daily. 10/19/18  Yes Johnson, Megan P, DO  amiodarone (PACERONE) 200 MG tablet Take 200 mg by mouth 2 (two) times daily.   Yes [provider]  aspirin 81 MG chewable tablet Chew 81 mg by mouth daily.   Yes [provider]  atorvastatin (LIPITOR) 40 MG tablet Take 1 tablet (40 mg total) by mouth daily at 6 PM. 04/22/19  Yes Hackney, Otila Kluver A, FNP  carvedilol (COREG) 3.125 MG tablet Take 3.125 mg by mouth 2 (two) times daily with a meal.   Yes [provider]  clopidogrel (PLAVIX) 75 MG tablet TAKE ONE TABLET BY MOUTH EVERY DAY Patient taking differently: Take 75 mg by mouth daily. At [facility] 02/13/19  Yes Bensimhon, Shaune Pascal, MD  colchicine 0.6 MG tablet Take 1 tablet (0.6 mg total) by mouth daily as needed (gout flare). 09/03/18  Yes Johnson, Megan P, DO  cyclobenzaprine (FLEXERIL) 10 MG tablet Take 1 tablet (10 mg total) by mouth at bedtime. Patient taking differently: Take 10 mg by mouth at bedtime as needed  for muscle spasms.  05/21/19  Yes Johnson, Megan P, DO  diclofenac sodium (VOLTAREN) 1 % GEL Apply 2 g topically 4 (four) times daily as needed (pain).    Yes [provider]  fluticasone (FLONASE) 50 MCG/ACT nasal spray Place 2 sprays into both nostrils daily. Patient  taking differently: Place 2 sprays into both nostrils daily as needed for allergies.  06/20/17  Yes [name], MD  fluticasone-salmeterol (ADVAIR HFA) [phone] MCG/ACT inhaler Inhale 2 puffs into the lungs 2 (two) times daily. 08/12/18  Yes [name], MD  Magnesium 400 MG TABS Take 1 tablet by mouth 2 (two) times daily. 04/22/19  Yes [name], FNP  Multiple Vitamin (MULTIVITAMIN WITH MINERALS) TABS tablet Take 1 tablet by mouth daily.   Yes [provider]  nitroGLYCERIN (NITROSTAT) 0.4 MG SL tablet Place 1 tablet (0.4 mg total) under the tongue every 5 (five) minutes as needed for chest pain. 04/22/19  Yes Hackney, Tina A, FNP  polyethylene glycol (MIRALAX / GLYCOLAX) 17 g packet Take 17 g by mouth daily. 04/20/19  Yes Mody, Ulice Bold, MD  potassium chloride SA (K-DUR) 20 MEQ tablet Take 1 tablet (20 mEq total) by mouth 2 (two) times daily. 06/26/19  Yes Fritzi Mandes, MD  sennosides-docusate sodium (SENOKOT-S) 8.6-50 MG tablet Take 1 tablet by mouth daily.   Yes [provider]  sodium chloride (OCEAN) 0.65 % SOLN nasal spray Place 1 spray into both nostrils as needed for congestion.   Yes [provider]  spironolactone (ALDACTONE) 25 MG tablet Take 0.5 tablets (12.5 mg total) by mouth daily. 04/15/19  Yes Vaughan Basta, MD  Tiotropium Bromide Monohydrate (SPIRIVA RESPIMAT) 2.5 MCG/ACT AERS Inhale 1 puff into the lungs daily. 08/12/18  Yes [name], MD  torsemide (DEMADEX) 20 MG tablet Take 1 tablet (20 mg total) by mouth 2 (two) times daily. 06/26/19  Yes Fritzi Mandes, MD  Nutritional Supplements (FEEDING SUPPLEMENT, NEPRO CARB STEADY,) LIQD Take 237 mLs by mouth 2 (two) times daily between meals. 06/26/19   Fritzi Mandes, MD   Allergies  Allergen Reactions  . Lisinopril Cough   Review of Systems  Unable to perform ROS: Acuity of condition    Physical Exam Vitals signs and nursing note reviewed.  Constitutional:      General: He is not in acute  distress.    Appearance: He is ill-appearing.  HENT:     Head: Atraumatic.  Cardiovascular:     Rate and Rhythm: Normal rate.  Pulmonary:     Comments: BiPAP in place. Able to speak in short sentances.  Abdominal:     Palpations: Abdomen is soft.  Musculoskeletal:     Right lower leg: No edema.     Left lower leg: No edema.  Skin:    General: Skin is warm and dry.  Neurological:     Mental Status: He is alert and oriented to person, place, and time.  Psychiatric:        Mood and Affect: Mood is not anxious.        Behavior: Behavior is not agitated.     Vital Signs: BP (!) 87/72   Pulse 75   Temp 97.8 F (36.6 C) (Oral)   Resp (!) 35   Ht '6\' 2"'  (1.88 m)   Wt 89.5 kg   SpO2 94%   BMI 25.33 kg/m  Pain Scale: 0-10   Pain Score: 0-No pain   SpO2: SpO2: 94 % O2 Device:SpO2: 94 % O2 Flow Rate: .  O2 Flow Rate (L/min): 4 L/min  IO: Intake/output summary:   Intake/Output Summary (Last 24 hours) at 07/06/2019 1204 Last data filed at 07/06/2019 0600 Gross per 24 hour  Intake 460 ml  Output 1005 ml  Net -545 ml    LBM: Last BM Date: 07/01/19 Baseline Weight: Weight: 95.3 kg Most recent weight: Weight: 89.5 kg     Palliative Assessment/Data:   Flowsheet Rows     Most Recent Value  Intake Tab  Referral Department  Hospitalist  Unit at Time of Referral  ICU  Date Notified  07/05/19  Palliative Care Type  New Palliative care  Date of Admission  07/01/19  Date first seen by Palliative Care  07/06/19  # of days Palliative referral response time  1 Day(s)  # of days IP prior to Palliative referral  4  Clinical Assessment  Palliative Performance Scale Score  30%  Pain Max last 24 hours  Not able to report  Pain Min Last 24 hours  Not able to report  Dyspnea Max Last 24 Hours  Not able to report  Dyspnea Min Last 24 hours  Not able to report  Psychosocial & Spiritual Assessment  Palliative Care Outcomes      Time In: 0910 Time Out: 1020 Time Total: 70 minutes   Greater than 50%  of this time was spent counseling and coordinating care related to the above assessment and plan.  Signed by: Drue Novel, NP   Please contact Palliative Medicine Team phone at [phone] for questions and concerns.  For individual provider: See Amion  Note: This dictation was prepared with Dragon dictation along with smaller phrase technology. Any transcriptional errors that result from this process are unintentional.

## 2019-07-06 NOTE — Consult Note (Signed)
ANTICOAGULATION CONSULT NOTE - Initial Consult  Pharmacy Consult for Heparin Indication: CRRT  Allergies  Allergen Reactions  . Lisinopril Cough    Patient Measurements: Height: 6\' 2"  (188 cm) Weight: 197 lb 5 oz (89.5 kg) IBW/kg (Calculated) : 82.2   Vital Signs: Temp: 98 F (36.7 C) (07/06 1200) Temp Source: Oral (07/06 1200) BP: 93/81 (07/06 1500) Pulse Rate: 82 (07/06 1500)  Labs: Recent Labs    07/05/19 0418  07/06/19 0523 07/06/19 0740 07/06/19 1119 07/06/19 1607  HGB 10.3*  --  10.1*  --   --   --   HCT 32.3*  --  32.6*  --   --   --   PLT 414*  --  375  --   --   --   CREATININE 2.55*   < >  --  1.57* 1.42* 1.57*   < > = values in this interval not displayed.    Estimated Creatinine Clearance: 62.5 mL/min (A) (by C-G formula based on SCr of 1.57 mg/dL (H)).   Medical History: Past Medical History:  Diagnosis Date  . AICD (automatic cardioverter/defibrillator) present   . AKI (acute kidney injury) ([place]) 12/24/2017  . Arrhythmia   . Arthritis    "lower back, knees" (06/10/2018)  . Cardiac arrest ([place]) 12/23/2017   Brief V-fib arrest  . Chest pain 09/08/2017  . CHF (congestive heart failure) (HCC)    nonischemic cardiomyopathy, EF 25%  . Gout    "on daily RX" (06/10/2018)  . Headache    "q couple months" (06/10/2018)  . High cholesterol   . History of kidney stones   . Hypertension   . Influenza A 12/24/2017  . NICM (nonischemic cardiomyopathy) ([place])   . Pneumonia 12/20/2017  . TIA (transient ischemic attack) 06/21/2016   "still affects my memory a little bit" (06/10/2018)    Medications:  Medications Prior to Admission  Medication Sig Dispense Refill Last Dose  . acetaminophen (TYLENOL) 325 MG tablet Take 2 tablets (650 mg total) by mouth every 6 (six) hours as needed. (Patient taking differently: Take 325-650 mg by mouth. ) 60 tablet 0 prn at prn  . albuterol (PROVENTIL HFA;VENTOLIN HFA) 108 (90 Base) MCG/ACT inhaler Inhale 2 puffs into the  lungs every 4 (four) hours as needed for wheezing or shortness of breath. 1 Inhaler 0 prn at prn  . allopurinol (ZYLOPRIM) 100 MG tablet Take 2 tablets (200 mg total) by mouth daily. 60 tablet 6 07/01/2019 at 0900  . amiodarone (PACERONE) 200 MG tablet Take 200 mg by mouth 2 (two) times daily.   07/01/2019 at 0800  . aspirin 81 MG chewable tablet Chew 81 mg by mouth daily.   07/01/2019 at 0900  . atorvastatin (LIPITOR) 40 MG tablet Take 1 tablet (40 mg total) by mouth daily at 6 PM. 30 tablet 5 07/01/2019 at 1800  . carvedilol (COREG) 3.125 MG tablet Take 3.125 mg by mouth 2 (two) times daily with a meal.   07/01/2019 at 0800  . clopidogrel (PLAVIX) 75 MG tablet TAKE ONE TABLET BY MOUTH EVERY DAY (Patient taking differently: Take 75 mg by mouth daily. At [facility]) 30 tablet 5 07/01/2019 at 1800  . colchicine 0.6 MG tablet Take 1 tablet (0.6 mg total) by mouth daily as needed (gout flare). 6 tablet 1 Past Week at Unknown time  . cyclobenzaprine (FLEXERIL) 10 MG tablet Take 1 tablet (10 mg total) by mouth at bedtime. (Patient taking differently: Take 10 mg by mouth at bedtime as needed  for muscle spasms. ) 30 tablet 0 prn at prn  . diclofenac sodium (VOLTAREN) 1 % GEL Apply 2 g topically 4 (four) times daily as needed (pain).    prn at prn  . fluticasone (FLONASE) 50 MCG/ACT nasal spray Place 2 sprays into both nostrils daily. (Patient taking differently: Place 2 sprays into both nostrils daily as needed for allergies. ) 16 g 2 07/01/2019 at 1100  . fluticasone-salmeterol (ADVAIR HFA) 115-21 MCG/ACT inhaler Inhale 2 puffs into the lungs 2 (two) times daily. 1 Inhaler 12 07/01/2019 at 1200  . Magnesium 400 MG TABS Take 1 tablet by mouth 2 (two) times daily. 60 tablet 5 07/01/2019 at 0900  . Multiple Vitamin (MULTIVITAMIN WITH MINERALS) TABS tablet Take 1 tablet by mouth daily.   07/01/2019 at 0800  . nitroGLYCERIN (NITROSTAT) 0.4 MG SL tablet Place 1 tablet (0.4 mg total) under the tongue every 5 (five) minutes as needed for  chest pain. 25 tablet 3 prn at prn  . polyethylene glycol (MIRALAX / GLYCOLAX) 17 g packet Take 17 g by mouth daily. 14 each 0 prn at prn  . potassium chloride SA (K-DUR) 20 MEQ tablet Take 1 tablet (20 mEq total) by mouth 2 (two) times daily. 30 tablet 1 07/01/2019 at 1200  . sennosides-docusate sodium (SENOKOT-S) 8.6-50 MG tablet Take 1 tablet by mouth daily.   07/01/2019 at 0800  . sodium chloride (OCEAN) 0.65 % SOLN nasal spray Place 1 spray into both nostrils as needed for congestion.   Prn at prn  . spironolactone (ALDACTONE) 25 MG tablet Take 0.5 tablets (12.5 mg total) by mouth daily. 30 tablet 0 07/01/2019 at 0900  . Tiotropium Bromide Monohydrate (SPIRIVA RESPIMAT) 2.5 MCG/ACT AERS Inhale 1 puff into the lungs daily. 1 Inhaler 12 07/01/2019 at 0800  . torsemide (DEMADEX) 20 MG tablet Take 1 tablet (20 mg total) by mouth 2 (two) times daily. 60 tablet 1 07/01/2019 at 0800  . Nutritional Supplements (FEEDING SUPPLEMENT, NEPRO CARB STEADY,) LIQD Take 237 mLs by mouth 2 (two) times daily between meals. 1 mL 0    Scheduled:  . allopurinol  200 mg Oral Daily  . amiodarone  200 mg Oral BID  . aspirin  81 mg Oral Daily  . atorvastatin  40 mg Oral q1800  . Chlorhexidine Gluconate Cloth  6 each Topical Q0600  . clopidogrel  75 mg Oral Daily  . feeding supplement (NEPRO CARB STEADY)  237 mL Oral BID BM  . furosemide  40 mg Intravenous Once  . gabapentin  100 mg Oral BID  . heparin injection (subcutaneous)  5,000 Units Subcutaneous Q8H  . ipratropium-albuterol  3 mL Nebulization Q6H  . magnesium oxide  400 mg Oral BID  . midodrine  10 mg Oral TID WC  . multivitamin with minerals  1 tablet Oral Daily  . senna-docusate  1 tablet Oral Daily  . sodium chloride flush  3 mL Intravenous Q12H   Infusions:  . sodium chloride    . sodium chloride    . sodium chloride    . heparin 300 Units/hr (07/06/19 1640)  . pureflow 3 each (07/06/19 1645)   PRN: sodium chloride, sodium chloride, sodium chloride,  acetaminophen, alteplase, cyclobenzaprine, guaiFENesin, heparin, heparin, lidocaine (PF), lidocaine-prilocaine, lip balm, menthol-cetylpyridinium, nitroGLYCERIN, ondansetron (ZOFRAN) IV, pentafluoroprop-tetrafluoroeth, phenol, sodium chloride flush Anti-infectives (From admission, onward)   Start     Dose/Rate Route Frequency Ordered Stop   07/06/19 1000  Ampicillin-Sulbactam (UNASYN) 3 g in sodium chloride 0.9 % 100  mL IVPB  Status:  Discontinued     3 g 200 mL/hr over 30 Minutes Intravenous Every 8 hours 07/06/19 0904 07/06/19 0930   07/05/19 1500  Ampicillin-Sulbactam (UNASYN) 3 g in sodium chloride 0.9 % 100 mL IVPB  Status:  Discontinued     3 g 200 mL/hr over 30 Minutes Intravenous Every 6 hours 07/05/19 1438 07/06/19 0904      Assessment: Heparin started for CRRT, to prevent clotting. Running into the machine.   Goal of Therapy:  Monitor platelets by anticoagulation protocol: Yes   Plan:  Heparin is currently running at 300 unit/hr. Will order. Heparin level and aPTT with AM labs. Monitor patient for any bleeding.   Oswald Hillock, PharmD, BCPS.  07/06/2019,4:50 PM

## 2019-07-06 NOTE — Progress Notes (Signed)
Patient Name: Stephen [name] Date of Encounter: 07/06/2019  Hospital Problem List     Active Problems:   Acute exacerbation of CHF (congestive heart failure) (HCC)   Goals of care, counseling/discussion   Palliative care by specialist   DNR (do not resuscitate) discussion    Patient Profile     53 year old male with nonischemic cardiomyopathy.  Has AICD in place.  On amiodarone, dual antiplatelet therapy, Midrin and furosemide.  On hemodialysis.  Subjective   Slight improvement.  Less short of breath.  On BiPAP.  Inpatient Medications    . allopurinol  200 mg Oral Daily  . amiodarone  200 mg Oral BID  . aspirin  81 mg Oral Daily  . atorvastatin  40 mg Oral q1800  . Chlorhexidine Gluconate Cloth  6 each Topical Q0600  . clopidogrel  75 mg Oral Daily  . feeding supplement (NEPRO CARB STEADY)  237 mL Oral BID BM  . furosemide  40 mg Intravenous Once  . gabapentin  100 mg Oral BID  . heparin injection (subcutaneous)  5,000 Units Subcutaneous Q8H  . ipratropium-albuterol  3 mL Nebulization Q6H  . magnesium oxide  400 mg Oral BID  . midodrine  10 mg Oral TID WC  . multivitamin with minerals  1 tablet Oral Daily  . senna-docusate  1 tablet Oral Daily  . sodium chloride flush  3 mL Intravenous Q12H    Vital Signs    Vitals:   07/06/19 1300 07/06/19 1400 07/06/19 1428 07/06/19 1500  BP: (!) 87/69 90/70  93/81  Pulse: 71  76 82  Resp: (!) 34 (!) 42 (!) 28 (!) 22  Temp:      TempSrc:      SpO2: (!) 89%  95% 100%  Weight:      Height:        Intake/Output Summary (Last 24 hours) at 07/06/2019 1549 Last data filed at 07/06/2019 1515 Gross per 24 hour  Intake 460 ml  Output 1293 ml  Net -833 ml   Filed Weights   07/04/19 2147 07/05/19 0419 07/06/19 0450  Weight: 95.8 kg 90.4 kg 89.5 kg    Physical Exam    GEN: Well nourished, well developed, in no acute distress.  HEENT: normal.  Neck: Supple, no JVD, carotid bruits, or masses. Cardiac: RRR, no murmurs, rubs,  or gallops. No clubbing, cyanosis, edema.  Radials/DP/PT 2+ and equal bilaterally.  Respiratory:  Respirations regular and unlabored, clear to auscultation bilaterally. GI: Soft, nontender, nondistended, BS + x 4. MS: no deformity or atrophy. Skin: warm and dry, no rash. Neuro:  Strength and sensation are intact. Psych: Normal affect.  Labs    CBC Recent Labs    07/05/19 0418 07/06/19 0523  WBC 11.6* 8.5  HGB 10.3* 10.1*  HCT 32.3* 32.6*  MCV 87.5 89.3  PLT 414* 081   Basic Metabolic Panel Recent Labs    07/06/19 0740 07/06/19 1119  NA 138 137  K 4.8 5.0  CL 102 101  CO2 28 27  GLUCOSE 125* 130*  BUN 32* 28*  CREATININE 1.57* 1.42*  CALCIUM 8.9 9.0  MG 1.9 2.0  PHOS 2.5 2.1*   Liver Function Tests Recent Labs    07/06/19 0740 07/06/19 1119  ALBUMIN 2.1* 2.2*   No results for input(s): LIPASE, AMYLASE in the last 72 hours. Cardiac Enzymes No results for input(s): CKTOTAL, CKMB, CKMBINDEX, TROPONINI in the last 72 hours. BNP No results for input(s): BNP in the last 72  hours. D-Dimer No results for input(s): DDIMER in the last 72 hours. Hemoglobin A1C No results for input(s): HGBA1C in the last 72 hours. Fasting Lipid Panel No results for input(s): CHOL, HDL, LDLCALC, TRIG, CHOLHDL, LDLDIRECT in the last 72 hours. Thyroid Function Tests No results for input(s): TSH, T4TOTAL, T3FREE, THYROIDAB in the last 72 hours.  Invalid input(s): FREET3  Telemetry      ECG      Radiology    Dg Chest 1 View  Result Date: 07/01/2019 CLINICAL DATA:  Shortness of breath, lower extremity swelling. EXAM: CHEST  1 VIEW COMPARISON:  06/23/2019 FINDINGS: Cardiomegaly with vascular congestion and mild bilateral pulmonary opacities, likely edema. Findings are similar to prior study. No visible effusions. No acute bony abnormality. IMPRESSION: Cardiomegaly with vascular congestion and diffuse bilateral airspace opacities, likely edema/CHF. Findings similar to prior study.  Electronically Signed   By: Rolm Baptise M.D.   On: 07/01/2019 22:39   Dg Chest 2 View  Result Date: 06/12/2019 CLINICAL DATA:  Chest pain EXAM: CHEST - 2 VIEW COMPARISON:  05/30/2019 FINDINGS: The heart size is enlarged. There is volume overload with findings of developing pulmonary edema. There are more focal areas of increased attenuation in the retrocardiac region and at the right lung base. No pneumothorax. A left-sided ICD is noted. There is no acute osseous abnormality. IMPRESSION: 1. Cardiomegaly with developing pulmonary edema. 2. More focal airspace opacities in the bilateral lower lung zones favored to represent sequela of developing pulmonary edema as opposed to a developing infectious process. Attention on follow-up examinations is recommended. Electronically Signed   By: Constance Holster M.D.   On: 06/12/2019 19:38   Dg Knee 2 Views Left  Result Date: 07/02/2019 CLINICAL DATA:  Knee pain, swelling EXAM: LEFT KNEE - 1-2 VIEW COMPARISON:  10/17/2018 FINDINGS: Old healed proximal tibial fracture with deformity. Advanced tricompartment degenerative changes most pronounced in the medial compartment. Moderate joint effusion. No acute fracture, subluxation or dislocation. Findings are similar prior study. IMPRESSION: Remote posttraumatic deformity of the proximal tibia. Tricompartment degenerative changes with moderate joint effusion. No acute bony abnormality. Electronically Signed   By: Rolm Baptise M.D.   On: 07/02/2019 00:20   US Venous Img Lower Unilateral Left  Result Date: 07/02/2019 CLINICAL DATA:  Left leg swelling EXAM: LEFT LOWER EXTREMITY VENOUS DOPPLER ULTRASOUND TECHNIQUE: Gray-scale sonography with graded compression, as well as color Doppler and duplex ultrasound were performed to evaluate the lower extremity deep venous systems from the level of the common femoral vein and including the common femoral, femoral, profunda femoral, popliteal and calf veins including the posterior  tibial, peroneal and gastrocnemius veins when visible. The superficial great saphenous vein was also interrogated. Spectral Doppler was utilized to evaluate flow at rest and with distal augmentation maneuvers in the common femoral, femoral and popliteal veins. COMPARISON:  None. FINDINGS: Contralateral Common Femoral Vein: Respiratory phasicity is normal and symmetric with the symptomatic side. No evidence of thrombus. Normal compressibility. Common Femoral Vein: No evidence of thrombus. Normal compressibility, respiratory phasicity and response to augmentation. Saphenofemoral Junction: No evidence of thrombus. Normal compressibility and flow on color Doppler imaging. Profunda Femoral Vein: No evidence of thrombus. Normal compressibility and flow on color Doppler imaging. Femoral Vein: No evidence of thrombus. Normal compressibility, respiratory phasicity and response to augmentation. Popliteal Vein: No evidence of thrombus. Normal compressibility, respiratory phasicity and response to augmentation. Calf Veins: No evidence of thrombus. Normal compressibility and flow on color Doppler imaging. Superficial Great Saphenous Vein: No  evidence of thrombus. Normal compressibility. Venous Reflux:  None. Other Findings:  None. IMPRESSION: No evidence of deep venous thrombosis. Electronically Signed   By: Rolm Baptise M.D.   On: 07/02/2019 01:06   Dg Chest Port 1 View  Result Date: 07/05/2019 CLINICAL DATA:  Congestive heart failure. Interstitial lung disease. EXAM: PORTABLE CHEST 1 VIEW COMPARISON:  07/03/2019 FINDINGS: Chronic cardiomegaly. Persistent and slight worsening diffuse pulmonary density which could be due to a combination of edema, pneumonia and ARDS. No visible pleural effusion. Pacemaker/AICD is grossly unchanged. IMPRESSION: Persistent and possibly slightly worsening diffuse pulmonary density consistent with edema, pneumonia or ARDS. Electronically Signed   By: Nelson Chimes M.D.   On: 07/05/2019 08:51    Dg Chest Port 1 View  Result Date: 07/03/2019 CLINICAL DATA:  Congestive heart failure. EXAM: PORTABLE CHEST 1 VIEW COMPARISON:  Multiple previous chest x-rays. The most recent is 07/01/2019. FINDINGS: Stable cardiac enlargement and mild tortuosity of the thoracic aorta. There is worsening CHF with marked central vascular congestion and interstitial and alveolar pulmonary edema, more pronounced on the right than left. Suspect left pleural effusion. IMPRESSION: Slight worsening diffuse and slightly asymmetric interstitial and airspace process, likely worsening edema/CHF. Persistent left effusion. Electronically Signed   By: Marijo Sanes M.D.   On: 07/03/2019 09:21   Dg Chest Port 1 View  Result Date: 06/23/2019 CLINICAL DATA:  Shortness of breath. EXAM: PORTABLE CHEST 1 VIEW COMPARISON:  06/21/2019. FINDINGS: Cardiac pacer stable position. Stable severe cardiomegaly. Diffuse bilateral pulmonary infiltrates/edema right side greater than left again noted. Similar findings on prior exam. No prominent pleural effusion or pneumothorax. IMPRESSION: 1.  Cardiac pacer stable position.  Severe cardiomegaly again noted. 2. Diffuse bilateral pulmonary infiltrates/edema right side greater than left again noted. Similar findings on prior exam. Electronically Signed   By: Huson   On: 06/23/2019 15:40   Dg Chest Port 1 View  Result Date: 06/21/2019 CLINICAL DATA:  Worsening shortness of breath. EXAM: PORTABLE CHEST 1 VIEW COMPARISON:  06/21/2019 earlier FINDINGS: Left-sided pacemaker unchanged. Lungs are adequately inflated demonstrate stable bilateral patchy airspace process right worse than left likely multifocal pneumonia. Stable cardiomegaly. Remainder of the exam is unchanged. IMPRESSION: Stable bilateral patchy multifocal airspace process right worse than left likely multifocal pneumonia. Stable cardiomegaly. Electronically Signed   By: Marin Olp M.D.   On: 06/21/2019 08:45   Dg Chest Port 1  View  Result Date: 06/21/2019 CLINICAL DATA:  Shortness of breath. EXAM: PORTABLE CHEST 1 VIEW COMPARISON:  06/14/2019 FINDINGS: Single lead left-sided pacemaker remains in place. Prominent cardiomegaly is unchanged from prior exam. Worsening diffuse bilateral airspace opacities since prior exam. Suspect small bilateral pleural effusion. No pneumothorax. IMPRESSION: Worsening diffuse bilateral airspace opacities, may represent pulmonary edema or multifocal pneumonia. Probable bilateral pleural effusions. Cardiomegaly is unchanged. Electronically Signed   By: Keith Rake M.D.   On: 06/21/2019 00:58   Dg Abd Acute 2+v W 1v Chest  Result Date: 06/14/2019 CLINICAL DATA:  Shortness of breath. Intractable nausea and vomiting. New right lower abdominal pain. EXAM: DG ABDOMEN ACUTE W/ 1V CHEST COMPARISON:  Two-view chest x-ray 06/12/2019 FINDINGS: Heart is enlarged. Moderate pulmonary vascular congestion is present. There is slight increase in a diffuse interstitial pattern. Bibasilar opacities are present without significant consolidation. The bowel gas pattern is normal. Mild prominence of stool in the ascending colon and rectum. No obstruction is present. Degenerative changes are noted in the lower lumbar spine. IMPRESSION: 1. Stable cardiomegaly with increasing interstitial pattern  consistent with worsening edema. 2. Infection versus atelectasis at the lung bases. 3. Normal bowel gas pattern.  No obstruction or free air. Electronically Signed   By: San Morelle M.D.   On: 06/14/2019 15:16   Dg Duanne Limerick W Single Cm (sol Or Thin Ba)  Result Date: 06/17/2019 CLINICAL DATA:  Nausea vomiting. EXAM: UPPER GI SERIES WITH KUB TECHNIQUE: After obtaining a scout radiograph a routine upper GI series was performed using thin density barium FLUOROSCOPY TIME:  Fluoroscopy Time:  1 minutes 30 seconds Radiation Exposure Index (if provided by the fluoroscopic device): 50.9 mGy Number of Acquired Spot Images: 17  COMPARISON:  Abdomen series 06/14/2019. FINDINGS: KUB reveals cardiomegaly and right lower lobe infiltrate. Right lower lobe infiltrate would be consistent with pneumonia. Limited exam due to patient vomiting. Persistent lung smooth narrowing noted of the distal third of the thoracic esophagus. Endoscopic evaluation should be considered stomach appears normal. Duodenum appears normal. C-loop appears normal. No definite reflux noted. IMPRESSION: 1.  Cardiomegaly. 2.  Right lower lobe infiltrates consistent with pneumonia. 3. Long smooth narrowing over the distal third of the thoracic esophagus. Endoscopic evaluation should be considered. 4. Stomach and duodenum appear normal. This exam was limited due to patient vomiting. Electronically Signed   By: Marcello Moores  Register   On: 06/17/2019 08:37    Assessment & Plan    Nonischemic cardiomyopathy-patient with ejection fraction of 10 to 15%, with nonischemic etiology with frequent admissions for renal insufficiency, hyperkalemia and volume overload.  Currently being hemodialyzed.  We will continue with this following renal function.  When stable patient has an appointment with advanced heart failure team at [facility].  Signed, Javier Docker Nili Honda MD 07/06/2019, 3:49 PM  Pager: [phone]

## 2019-07-06 NOTE — Progress Notes (Signed)
Stephen [name] NAME: Stephen [name]    MR#:  [id number]  DATE OF BIRTH:  01/23/1966  SUBJECTIVE:   Patient on CRRT  REVIEW OF SYSTEMS:    Review of Systems  Constitutional: Negative for fever, chills weight loss HENT: Negative for ear pain, nosebleeds, congestion, facial swelling, rhinorrhea, neck pain, neck stiffness and ear discharge.   Respiratory: Negative for cough, shortness of breath, wheezing  Cardiovascular: Negative for chest pain, palpitations and leg swelling.  Gastrointestinal: Negative for heartburn, abdominal pain, vomiting, diarrhea or consitpation Genitourinary: Negative for dysuria, urgency, frequency, hematuria Musculoskeletal: Negative for back pain or joint pain Neurological: Negative for dizziness, seizures, syncope, focal weakness,  numbness and headaches.  Hematological: Does not bruise/bleed easily.  Psychiatric/Behavioral: Negative for hallucinations, confusion, dysphoric mood    Tolerating Diet: yes      DRUG ALLERGIES:   Allergies  Allergen Reactions  . Lisinopril Cough    VITALS:  Blood pressure (!) 87/72, pulse 75, temperature 97.8 F (36.6 C), temperature source Oral, resp. rate (!) 35, height 6\' 2"  (1.88 m), weight 89.5 kg, SpO2 94 %.  PHYSICAL EXAMINATION:  Constitutional: Appears well-developed and well-nourished. No distress. HENT: Normocephalic. Marland Kitchen Oropharynx is clear and moist.  Eyes: Conjunctivae and EOM are normal. PERRLA, no scleral icterus.  Neck: Normal ROM. Neck supple. No JVD. No tracheal deviation. CVS: RRR, S1/S2 +, 3/6 murmurs, no gallops, no carotid bruit.  Pulmonary: Effort and breath sounds normal, no stridor, rhonchi, wheezes, rales.  Abdominal: Soft. BS +,  no distension, tenderness, rebound or guarding.  Musculoskeletal: Normal range of motion. No edema and no tenderness.  Neuro: Alert. CN 2-12 grossly intact. No focal deficits. Skin: Skin is warm and dry. No rash  noted. Psychiatric: Normal mood and affect.      LABORATORY PANEL:   CBC Recent Labs  Lab 07/06/19 0523  WBC 8.5  HGB 10.1*  HCT 32.6*  PLT 375   ------------------------------------------------------------------------------------------------------------------  Chemistries  Recent Labs  Lab 07/01/19 1710  07/06/19 0740 07/06/19 1119  NA 136   < > 138 137  K 4.3   < > 4.8 5.0  CL 95*   < > 102 101  CO2 31   < > 28 27  GLUCOSE 98   < > 125* 130*  BUN 41*   < > 32* 28*  CREATININE 1.64*   < > 1.57* 1.42*  CALCIUM 9.2   < > 8.9 9.0  MG  --    < > 1.9  --   AST 29  --   --   --   ALT 55*  --   --   --   ALKPHOS 109  --   --   --   BILITOT 1.4*  --   --   --    < > = values in this interval not displayed.   ------------------------------------------------------------------------------------------------------------------  Cardiac Enzymes No results for input(s): TROPONINI in the last 168 hours. ------------------------------------------------------------------------------------------------------------------  RADIOLOGY:  Dg Chest Port 1 View  Result Date: 07/05/2019 CLINICAL DATA:  Congestive heart failure. Interstitial lung disease. EXAM: PORTABLE CHEST 1 VIEW COMPARISON:  07/03/2019 FINDINGS: Chronic cardiomegaly. Persistent and slight worsening diffuse pulmonary density which could be due to a combination of edema, pneumonia and ARDS. No visible pleural effusion. Pacemaker/AICD is grossly unchanged. IMPRESSION: Persistent and possibly slightly worsening diffuse pulmonary density consistent with edema, pneumonia or ARDS. Electronically Signed   By: Jan Fireman.D.  On: 07/05/2019 08:51     ASSESSMENT AND PLAN:   53 year old male with history of chronic systolic heart failure ejection fraction 10% who presented to ED with shortness of breath.  1.  Acute hypoxic respiratory failure due to underlying acute on chronic systolic heart failure with EF of 10%: Patient  has been started on hemodialysis as per recommendations by nephrology. BiPAP   2.  Severe ischemic cardiomyopathy EF 10 to 15%: Continue current management with CRRT. Patient has been accepted  at [facility] and will be transferred once bed available. Cardiology consultation appreciated  3.  Acute on chronic kidney disease stage III: Management as per cardiology. Etiology is due to cardiorenal syndrome.   D/w dr Sherlon Handing   Management plans discussed with the patient and he is in agreement.  CODE STATUS: full  TOTAL TIME TAKING CARE OF THIS PATIENT: 22 minutes.     POSSIBLE D/C today to [facility] when bed available.    Bettey Costa M.D on 07/06/2019 at 11:55 AM  Between 7am to 6pm - Pager - [phone] After 6pm go to [email or web address] - password [email or web address] [facility]  Office  [phone]  CC: Primary care physician; Valerie Roys, DO  Note: This dictation was prepared with Dragon dictation along with smaller phrase technology. Any transcriptional errors that result from this process are unintentional.

## 2019-07-06 NOTE — Progress Notes (Addendum)
Pt CRRT clotted and stopped unable to rinse back due to clotting of the filter.crrt restarted and had increase access pressures with consistent alarms. Pt blood rinseback and lines TPA will access later.

## 2019-07-06 NOTE — Discharge Planning (Deleted)
PULMONARY / CRITICAL CARE MEDICINE  Name: MATTEUS MCNELLY MRN: [id number] DOB: 02-13-1966    LOS: 4  Admitting diagnosis:  Discharge Diagnosis : Acute renal failure requiring emergent dialysis  HPI:  53 y/o male admitted with acute systolic CHF exacerbation, acute hypoxic respiratory failure, left knee effusion and CKD stage III; now in acute renal failure with hyperkalemia and metabolic acidosis respiratory failure. He underwent emergent HD and was transitioned to CVVHD. He remains ob BiPAP with an FiO2 of 40%. See  admission H & P for rest of the HPI.   Hospital Course: This is a 53 year old African-American male with a medical history as indicated below, severe nonischemic cardiomyopathy with an ejection fraction of 10 to 15% and an AICD, who was admitted on 07/01/2019 with increased lower extremity edema, dyspnea and left knee effusion.  He underwent a right knee aspiration in the ED.  His initial blood pressure was low but improved while in the ED.  His ED work-up was significant for proBNP of 1793 and diffuse interstitial edema on chest x-ray.  He was admitted with acute systolic heart failure with exacerbation and started on IV diuresis with lasix infusion.  He was seen by cardiology and nephrology for worsening renal failure.  Due to decrease in his urine output, the decision was made to start hemodialysis hence he was transferred to the ICU for further management. Underwent emergent hemodialysis on 07/04/2019 pm and was transitioned to CVVHD on 07/05/2019.  He was started on Unasyn on 07/05/2019 for possible aspiration pneumonitis.  His blood pressure are marginal but his MAPs are 65 and above. His mental status has improved but he remains hypoxic requiring continuous BiPAP and desaturates when off BiPAP.  Per Dr Ubaldo Glassing, plan was to discuss with the [facility] advanced heart failure team as patient had an upcoming appointment in 2 weeks. However, patient expressed the desire to be  transferred to [place] as he doesn't feel that he will be out of the hospital long enough to attend the appointment. The [facility] transfer center was contacted and patient was accepted for transfer. Accepting MD is Dr. Lillia Corporal at the cardiac ICU. He is awaiting a bed assignment.   Past Medical History:  Diagnosis Date  . AICD (automatic cardioverter/defibrillator) present   . AKI (acute kidney injury) ([place]) 12/24/2017  . Arrhythmia   . Arthritis    "lower back, knees" (06/10/2018)  . Cardiac arrest ([place]) 12/23/2017   Brief V-fib arrest  . Chest pain 09/08/2017  . CHF (congestive heart failure) (H[facility])    nonischemic cardiomyopathy, EF 25%  . Gout    "on daily RX" (06/10/2018)  . Headache    "q couple months" (06/10/2018)  . High cholesterol   . History of kidney stones   . Hypertension   . Influenza A 12/24/2017  . NICM (nonischemic cardiomyopathy) ([place])   . Pneumonia 12/20/2017  . TIA (transient ischemic attack) 06/21/2016   "still affects my memory a little bit" (06/10/2018)   Past Surgical History:  Procedure Laterality Date  . EXTERNAL FIXATION LEG Right ~ 2000   "was going bowlegged; had to brake my leg to fix it"  . HERNIA REPAIR    . ICD IMPLANT N/A 12/30/2017   Procedure: ICD IMPLANT;  Surgeon: Deboraha Sprang, MD;  Location: [place] CV LAB;  Service: Cardiovascular;  Laterality: N/A;  . LEFT HEART CATH AND CORONARY ANGIOGRAPHY N/A 09/09/2017   Procedure: LEFT HEART CATH AND CORONARY ANGIOGRAPHY;  Surgeon: Teodoro Spray,  MD;  Location: [place] CV LAB;  Service: Cardiovascular;  Laterality: N/A;  . UMBILICAL HERNIA REPAIR  1990s  . V TACH ABLATION  06/10/2018  . V TACH ABLATION N/A 06/10/2018   Procedure: V TACH ABLATION;  Surgeon: Thompson Grayer, MD;  Location: [place] CV LAB;  Service: Cardiovascular;  Laterality: N/A;  . VASECTOMY     No current facility-administered medications on file prior to encounter.    Current Outpatient Medications on File Prior to Encounter   Medication Sig  . acetaminophen (TYLENOL) 325 MG tablet Take 2 tablets (650 mg total) by mouth every 6 (six) hours as needed. (Patient taking differently: Take 325-650 mg by mouth. )  . albuterol (PROVENTIL HFA;VENTOLIN HFA) 108 (90 Base) MCG/ACT inhaler Inhale 2 puffs into the lungs every 4 (four) hours as needed for wheezing or shortness of breath.  . allopurinol (ZYLOPRIM) 100 MG tablet Take 2 tablets (200 mg total) by mouth daily.  Marland Kitchen amiodarone (PACERONE) 200 MG tablet Take 200 mg by mouth 2 (two) times daily.  Marland Kitchen aspirin 81 MG chewable tablet Chew 81 mg by mouth daily.  Marland Kitchen atorvastatin (LIPITOR) 40 MG tablet Take 1 tablet (40 mg total) by mouth daily at 6 PM.  . carvedilol (COREG) 3.125 MG tablet Take 3.125 mg by mouth 2 (two) times daily with a meal.  . clopidogrel (PLAVIX) 75 MG tablet TAKE ONE TABLET BY MOUTH EVERY DAY (Patient taking differently: Take 75 mg by mouth daily. At [facility])  . colchicine 0.6 MG tablet Take 1 tablet (0.6 mg total) by mouth daily as needed (gout flare).  . cyclobenzaprine (FLEXERIL) 10 MG tablet Take 1 tablet (10 mg total) by mouth at bedtime. (Patient taking differently: Take 10 mg by mouth at bedtime as needed for muscle spasms. )  . diclofenac sodium (VOLTAREN) 1 % GEL Apply 2 g topically 4 (four) times daily as needed (pain).   . fluticasone (FLONASE) 50 MCG/ACT nasal spray Place 2 sprays into both nostrils daily. (Patient taking differently: Place 2 sprays into both nostrils daily as needed for allergies. )  . fluticasone-salmeterol (ADVAIR HFA) 115-21 MCG/ACT inhaler Inhale 2 puffs into the lungs 2 (two) times daily.  . Magnesium 400 MG TABS Take 1 tablet by mouth 2 (two) times daily.  . Multiple Vitamin (MULTIVITAMIN WITH MINERALS) TABS tablet Take 1 tablet by mouth daily.  . nitroGLYCERIN (NITROSTAT) 0.4 MG SL tablet Place 1 tablet (0.4 mg total) under the tongue every 5 (five) minutes as needed for chest pain.  . polyethylene glycol (MIRALAX / GLYCOLAX) 17 g  packet Take 17 g by mouth daily.  . potassium chloride SA (K-DUR) 20 MEQ tablet Take 1 tablet (20 mEq total) by mouth 2 (two) times daily.  . sennosides-docusate sodium (SENOKOT-S) 8.6-50 MG tablet Take 1 tablet by mouth daily.  . sodium chloride (OCEAN) 0.65 % SOLN nasal spray Place 1 spray into both nostrils as needed for congestion.  Marland Kitchen spironolactone (ALDACTONE) 25 MG tablet Take 0.5 tablets (12.5 mg total) by mouth daily.  . Tiotropium Bromide Monohydrate (SPIRIVA RESPIMAT) 2.5 MCG/ACT AERS Inhale 1 puff into the lungs daily.  Marland Kitchen torsemide (DEMADEX) 20 MG tablet Take 1 tablet (20 mg total) by mouth 2 (two) times daily.  . Nutritional Supplements (FEEDING SUPPLEMENT, NEPRO CARB STEADY,) LIQD Take 237 mLs by mouth 2 (two) times daily between meals.    Allergies Allergies  Allergen Reactions  . Lisinopril Cough    Family History Family History  Problem Relation Age of  Onset  . Hypertension Mother   . Heart failure Mother   . Hypertension Father   . CAD Father   . Heart attack Father    Social History  reports that he quit smoking about 3 years ago. His smoking use included cigarettes. He has a 10.89 pack-year smoking history. He has never used smokeless tobacco. He reports previous alcohol use. He reports that he does not use drugs.  Review Of Systems:   Constitutional: Negative for fever and chills.  HENT: Negative for congestion and rhinorrhea.  Eyes: Negative for redness and visual disturbance.  Respiratory: Positive for shortness of breath but negative wheezing.  Cardiovascular: Negative for chest pain and palpitations but positive for edema.  Gastrointestinal: Negative  for nausea , vomiting and abdominal pain and  Loose stools Genitourinary: Negative for dysuria and urgency.  Endocrine: Denies polyuria, polyphagia and heat intolerance Musculoskeletal: Negative for myalgias and arthralgias.  Skin: Negative for pallor and wound.  Neurological: Negative for dizziness and  headaches   VITAL SIGNS: BP (!) 90/58   Pulse 73   Temp 98.2 F (36.8 C) (Axillary)   Resp 20   Ht 6\' 2"  (1.88 m)   Wt 90.4 kg   SpO2 94%   BMI 25.59 kg/m   HEMODYNAMICS:    VENTILATOR SETTINGS: FiO2 (%):  [40 %-55 %] 55 %  INTAKE / OUTPUT: I/O last 3 completed shifts: In: 208.2 [P.O.:100; I.V.:8.2; IV Piggyback:100] Out: 4401 [Urine:235; Other:891]  PHYSICAL EXAMINATION: General: Chronically ill looking, in moderate respiratory distress HEENT: PERRLA, trachea midline, moderate JVD Neuro: Awake and follows basic commands, speech is normal, moves all extremities Cardiovascular: Pulse regular, S1-S2, no murmur regurg or gallop, +2 pulses bilaterally, bilateral lower extremity edema Lungs: Increased work of breathing, bilateral breath sounds without any wheezes or rhonchi, diminished in the bases Abdomen: Nondistended, normal bowel sounds in all 4 quadrants, palpation reveals no organomegaly Musculoskeletal: Positive range of motion, right knee Skin: Warm and dry  LABS:  BMET Recent Labs  Lab 07/05/19 1509 07/05/19 1807 07/05/19 2217  NA 135 135 135  K 4.8 4.6 4.7  CL 100 98 98  CO2 24 27 27   BUN 54* 49* 41*  CREATININE 2.20* 1.90* 1.68*  GLUCOSE 127* 133* 117*    Electrolytes Recent Labs  Lab 07/05/19 1147 07/05/19 1509 07/05/19 1807 07/05/19 2217  CALCIUM 8.9 9.1 8.9 9.0  MG  --  2.2 2.1 2.0  PHOS 5.0* 4.1  --  2.8    CBC Recent Labs  Lab 07/01/19 1710 07/05/19 0418  WBC 13.7* 11.6*  HGB 12.0* 10.3*  HCT 37.0* 32.3*  PLT 547* 414*    Coag's No results for input(s): APTT, INR in the last 168 hours.  Sepsis Markers Recent Labs  Lab 07/05/19 2331  LATICACIDVEN 1.5    ABG No results for input(s): PHART, PCO2ART, PO2ART in the last 168 hours.  Liver Enzymes Recent Labs  Lab 07/01/19 1710 07/05/19 1147 07/05/19 1509 07/05/19 2217  AST 29  --   --   --   ALT 55*  --   --   --   ALKPHOS 109  --   --   --   BILITOT 1.4*  --   --    --   ALBUMIN 2.7* 2.4* 2.4* 2.3*    Cardiac Enzymes No results for input(s): TROPONINI, PROBNP in the last 168 hours.  Glucose Recent Labs  Lab 07/04/19 1422  GLUCAP 133*    Imaging Dg Chest Port 1  View  Result Date: 07/05/2019 CLINICAL DATA:  Congestive heart failure. Interstitial lung disease. EXAM: PORTABLE CHEST 1 VIEW COMPARISON:  07/03/2019 FINDINGS: Chronic cardiomegaly. Persistent and slight worsening diffuse pulmonary density which could be due to a combination of edema, pneumonia and ARDS. No visible pleural effusion. Pacemaker/AICD is grossly unchanged. IMPRESSION: Persistent and possibly slightly worsening diffuse pulmonary density consistent with edema, pneumonia or ARDS. Electronically Signed   By: Nelson Chimes M.D.   On: 07/05/2019 08:51    STUDIES:  Last 2-D echo 02/24/2019:  IMPRESSIONS    1. The left ventricle has a visually estimated ejection fraction of of 10%. The cavity size was severely dilated. There is severely increased left ventricular wall thickness. Left ventricular diastolic Doppler parameters are consistent with restrictive  filling Left ventricular diffuse hypokinesis.  2. The right ventricle has normal systolic function. The cavity was normal. There is no increase in right ventricular wall thickness.  3. Left atrial size was moderately dilated.  4. The mitral valve is normal in structure. Mitral valve regurgitation is moderate to severe by color flow Doppler.  5. The tricuspid valve is normal in structure.  6. The aortic valve is normal in structure. Aortic valve regurgitation is trivial by color flow Doppler.  7. The interatrial septum was not well visualized.  CULTURES: Blood cultures x 2  ANTIBIOTICS: Unasyn 75/202>  SIGNIFICANT EVENTS: 07/01/2019: Admitted 07/04/2019: Started on hemodialysis 07/05/2019: Started on CVVHD and transfer to [place] initiated  LINES/TUBES: Right femoral hemodialysis catheter  DISCUSSION: 53 year old  African-American male presenting with cardiorenal syndrome, left knee effusion status post aspiration and worsening renal failure requiring emergent hemodialysis.  ASSESSMENT Acute renal failure requiring hemodialysis Hyperkalemia Acute systolic heart failure with exacerbation Ischemic cardiomyopathy with reduced EF of 10 to 15% Left knee effusion status post aspiration Acute hypoxic respiratory failure requiring BiPAP Acute pulmonary edema   PLAN Hemodynamic monitoring per ICU protocol Hemodialysis per nephrology- 0.5 L removed during today's HD session Cardiology following Post HD labs reviewed-persistent hyperkalemia Nebulized bronchodilators and inhaled steroids Continue all home medications per cardiology Awaiting transfer to [facility]: Code Status: Full code Diet: Heart healthy GI prophylaxis: Not indicated  VTE prophylaxis: SCDs and subcu heparin  FAMILY  - Updates: Patient updated on transfer status   Magdalene S. Tukov-Yual ANP-BC Pulmonary and [place] Pager [phone] or [phone]  NB: This document was prepared using Dragon voice recognition software and may include unintentional dictation errors.    07/06/2019, 12:34 AM

## 2019-07-06 NOTE — Progress Notes (Signed)
Filter clotted off and CRRT stopped. Unable to rinse back.

## 2019-07-06 NOTE — Progress Notes (Signed)
Pt remains on crrt this shift tolerating well.pt gone between bipap and 6lnc this shift. 150cc urine output this shift.

## 2019-07-07 ENCOUNTER — Encounter
Admission: EM | Disposition: A | Discharge status: Other Acute Inpatient Hospital | Payer: Self-pay | Source: Home / Self Care | Attending: Internal Medicine

## 2019-07-07 DIAGNOSIS — N179 Acute kidney failure, unspecified: Secondary | ICD-10-CM

## 2019-07-07 DIAGNOSIS — T82898A Other specified complication of vascular prosthetic devices, implants and grafts, initial encounter: Secondary | ICD-10-CM

## 2019-07-07 DIAGNOSIS — J810 Acute pulmonary edema: Secondary | ICD-10-CM

## 2019-07-07 DIAGNOSIS — N189 Chronic kidney disease, unspecified: Secondary | ICD-10-CM

## 2019-07-07 DIAGNOSIS — J9601 Acute respiratory failure with hypoxia: Secondary | ICD-10-CM

## 2019-07-07 LAB — RENAL FUNCTION PANEL
Albumin: 2.1 g/dL — ABNORMAL LOW (ref 3.5–5.0)
Albumin: 2.2 g/dL — ABNORMAL LOW (ref 3.5–5.0)
Albumin: 2.2 g/dL — ABNORMAL LOW (ref 3.5–5.0)
Albumin: 2.1 g/dL — ABNORMAL LOW (ref 3.5–5.0)
Albumin: 2.2 g/dL — ABNORMAL LOW (ref 3.5–5.0)
Albumin: 2.2 g/dL — ABNORMAL LOW (ref 3.5–5.0)
Albumin: 2.3 g/dL — ABNORMAL LOW (ref 3.5–5.0)
Anion gap: 11 (ref 5–15)
Anion gap: 8 (ref 5–15)
Anion gap: 8 (ref 5–15)
Anion gap: 7 (ref 5–15)
Anion gap: 8 (ref 5–15)
Anion gap: 9 (ref 5–15)
Anion gap: 10 (ref 5–15)
BUN: 32 mg/dL — ABNORMAL HIGH (ref 6–20)
BUN: 33 mg/dL — ABNORMAL HIGH (ref 6–20)
BUN: 31 mg/dL — ABNORMAL HIGH (ref 6–20)
BUN: 29 mg/dL — ABNORMAL HIGH (ref 6–20)
BUN: 26 mg/dL — ABNORMAL HIGH (ref 6–20)
BUN: 24 mg/dL — ABNORMAL HIGH (ref 6–20)
BUN: 26 mg/dL — ABNORMAL HIGH (ref 6–20)
CO2: 23 mmol/L (ref 22–32)
CO2: 26 mmol/L (ref 22–32)
CO2: 26 mmol/L (ref 22–32)
CO2: 26 mmol/L (ref 22–32)
CO2: 27 mmol/L (ref 22–32)
CO2: 25 mmol/L (ref 22–32)
CO2: 27 mmol/L (ref 22–32)
Calcium: 8.8 mg/dL — ABNORMAL LOW (ref 8.9–10.3)
Calcium: 8.9 mg/dL (ref 8.9–10.3)
Calcium: 8.7 mg/dL — ABNORMAL LOW (ref 8.9–10.3)
Calcium: 8.8 mg/dL — ABNORMAL LOW (ref 8.9–10.3)
Calcium: 8.8 mg/dL — ABNORMAL LOW (ref 8.9–10.3)
Calcium: 8.8 mg/dL — ABNORMAL LOW (ref 8.9–10.3)
Calcium: 9.2 mg/dL (ref 8.9–10.3)
Chloride: 100 mmol/L (ref 98–111)
Chloride: 101 mmol/L (ref 98–111)
Chloride: 102 mmol/L (ref 98–111)
Chloride: 101 mmol/L (ref 98–111)
Chloride: 99 mmol/L (ref 98–111)
Chloride: 101 mmol/L (ref 98–111)
Chloride: 101 mmol/L (ref 98–111)
Creatinine, Ser: 1.46 mg/dL — ABNORMAL HIGH (ref 0.61–1.24)
Creatinine, Ser: 1.65 mg/dL — ABNORMAL HIGH (ref 0.61–1.24)
Creatinine, Ser: 1.46 mg/dL — ABNORMAL HIGH (ref 0.61–1.24)
Creatinine, Ser: 1.61 mg/dL — ABNORMAL HIGH (ref 0.61–1.24)
Creatinine, Ser: 1.63 mg/dL — ABNORMAL HIGH (ref 0.61–1.24)
Creatinine, Ser: 1.52 mg/dL — ABNORMAL HIGH (ref 0.61–1.24)
Creatinine, Ser: 1.83 mg/dL — ABNORMAL HIGH (ref 0.61–1.24)
GFR calc Af Amer: >60 mL/min (ref >60)
GFR calc Af Amer: 54 mL/min — ABNORMAL LOW (ref >60)
GFR calc Af Amer: >60 mL/min (ref >60)
GFR calc Af Amer: 55 mL/min — ABNORMAL LOW (ref >60)
GFR calc Af Amer: 55 mL/min — ABNORMAL LOW (ref >60)
GFR calc Af Amer: 59 mL/min — ABNORMAL LOW (ref >60)
GFR calc Af Amer: 47 mL/min — ABNORMAL LOW (ref >60)
GFR calc non Af Amer: 54 mL/min — ABNORMAL LOW (ref >60)
GFR calc non Af Amer: 46 mL/min — ABNORMAL LOW (ref >60)
GFR calc non Af Amer: 54 mL/min — ABNORMAL LOW (ref >60)
GFR calc non Af Amer: 48 mL/min — ABNORMAL LOW (ref >60)
GFR calc non Af Amer: 47 mL/min — ABNORMAL LOW (ref >60)
GFR calc non Af Amer: 51 mL/min — ABNORMAL LOW (ref >60)
GFR calc non Af Amer: 41 mL/min — ABNORMAL LOW (ref >60)
Glucose, Bld: 128 mg/dL — ABNORMAL HIGH (ref 70–99)
Glucose, Bld: 153 mg/dL — ABNORMAL HIGH (ref 70–99)
Glucose, Bld: 119 mg/dL — ABNORMAL HIGH (ref 70–99)
Glucose, Bld: 144 mg/dL — ABNORMAL HIGH (ref 70–99)
Glucose, Bld: 142 mg/dL — ABNORMAL HIGH (ref 70–99)
Glucose, Bld: 141 mg/dL — ABNORMAL HIGH (ref 70–99)
Glucose, Bld: 125 mg/dL — ABNORMAL HIGH (ref 70–99)
Phosphorus: 2 mg/dL — ABNORMAL LOW (ref 2.5–4.6)
Phosphorus: 1.9 mg/dL — ABNORMAL LOW (ref 2.5–4.6)
Phosphorus: 1.9 mg/dL — ABNORMAL LOW (ref 2.5–4.6)
Phosphorus: 1.9 mg/dL — ABNORMAL LOW (ref 2.5–4.6)
Phosphorus: 3.2 mg/dL (ref 2.5–4.6)
Phosphorus: 3.5 mg/dL (ref 2.5–4.6)
Phosphorus: 3.1 mg/dL (ref 2.5–4.6)
Potassium: 4.6 mmol/L (ref 3.5–5.1)
Potassium: 4.5 mmol/L (ref 3.5–5.1)
Potassium: 4.4 mmol/L (ref 3.5–5.1)
Potassium: 4.4 mmol/L (ref 3.5–5.1)
Potassium: 4.5 mmol/L (ref 3.5–5.1)
Potassium: 4.4 mmol/L (ref 3.5–5.1)
Potassium: 4.9 mmol/L (ref 3.5–5.1)
Sodium: 134 mmol/L — ABNORMAL LOW (ref 135–145)
Sodium: 135 mmol/L (ref 135–145)
Sodium: 136 mmol/L (ref 135–145)
Sodium: 134 mmol/L — ABNORMAL LOW (ref 135–145)
Sodium: 134 mmol/L — ABNORMAL LOW (ref 135–145)
Sodium: 135 mmol/L (ref 135–145)
Sodium: 138 mmol/L (ref 135–145)

## 2019-07-07 LAB — CBC WITH DIFFERENTIAL/PLATELET
Abs Immature Granulocytes: 0.05 10*3/uL (ref 0.00–0.07)
Basophils Absolute: 0 10*3/uL (ref 0.0–0.1)
Basophils Relative: 0 %
Eosinophils Absolute: 0.2 10*3/uL (ref 0.0–0.5)
Eosinophils Relative: 3 %
HCT: 31.7 % — ABNORMAL LOW (ref 39.0–52.0)
Hemoglobin: 9.9 g/dL — ABNORMAL LOW (ref 13.0–17.0)
Immature Granulocytes: 1 %
Lymphocytes Relative: 17 %
Lymphs Abs: 1.3 10*3/uL (ref 0.7–4.0)
MCH: 28 pg (ref 26.0–34.0)
MCHC: 31.2 g/dL (ref 30.0–36.0)
MCV: 89.5 fL (ref 80.0–100.0)
Monocytes Absolute: 0.5 10*3/uL (ref 0.1–1.0)
Monocytes Relative: 6 %
Neutro Abs: 5.8 10*3/uL (ref 1.7–7.7)
Neutrophils Relative %: 73 %
Platelets: 326 10*3/uL (ref 150–400)
RBC: 3.54 MIL/uL — ABNORMAL LOW (ref 4.22–5.81)
RDW: 17 % — ABNORMAL HIGH (ref 11.5–15.5)
WBC: 8 10*3/uL (ref 4.0–10.5)
nRBC: 4.8 % — ABNORMAL HIGH (ref 0.0–0.2)

## 2019-07-07 LAB — MAGNESIUM
Magnesium: 2 mg/dL (ref 1.7–2.4)
Magnesium: 2 mg/dL (ref 1.7–2.4)
Magnesium: 2 mg/dL (ref 1.7–2.4)
Magnesium: 1.9 mg/dL (ref 1.7–2.4)
Magnesium: 2.2 mg/dL (ref 1.7–2.4)

## 2019-07-07 SURGERY — TEMPORARY DIALYSIS CATHETER
Anesthesia: Moderate Sedation

## 2019-07-07 MED ORDER — BISACODYL 10 MG RE SUPP: 10.0000 mg | Freq: Every day | RECTAL | Status: DC | PRN

## 2019-07-07 MED ORDER — SODIUM PHOSPHATES 45 MMOLE/15ML IV SOLN
30.0000 mmol | Freq: Once | INTRAVENOUS | Status: AC
  Administered 2019-07-07: 30 mmol via INTRAVENOUS
  Filled 2019-07-07: qty 10

## 2019-07-07 MED ORDER — DOCUSATE SODIUM 100 MG PO CAPS
100.0000 mg | ORAL_CAPSULE | Freq: Two times a day (BID) | ORAL | Status: DC
  Administered 2019-07-07 – 2019-07-08 (×2): 100 mg via ORAL
  Filled 2019-07-07 (×2): qty 1

## 2019-07-07 NOTE — Progress Notes (Addendum)
[place] [facility] [place] NAME: Stephen [name]    MR#:  [id number]  DATE OF BIRTH:  02/07/1966  SUBJECTIVE:  Still with SOB  Some LEE  catheter not functioning appropriately REVIEW OF SYSTEMS:    Review of Systems  Constitutional: Negative for fever, chills weight loss HENT: Negative for ear pain, nosebleeds, congestion, facial swelling, rhinorrhea, neck pain, neck stiffness and ear discharge.   Respiratory: Negative for cough, shortness of breath, wheezing  Cardiovascular: Negative for chest pain, palpitations and ++leg swelling.  Gastrointestinal: Negative for heartburn, abdominal pain, vomiting, diarrhea or consitpation Genitourinary: Negative for dysuria, urgency, frequency, hematuria Musculoskeletal: Negative for back pain or joint pain Neurological: Negative for dizziness, seizures, syncope, focal weakness,  numbness and headaches.  Hematological: Does not bruise/bleed easily.  Psychiatric/Behavioral: Negative for hallucinations, confusion, dysphoric mood    Tolerating Diet: yes      DRUG ALLERGIES:   Allergies  Allergen Reactions  . Lisinopril Cough    VITALS:  Blood pressure 90/74, pulse 81, temperature 98 F (36.7 C), resp. rate 20, height 6\' 2"  (1.88 m), weight 89.1 kg, SpO2 96 %.  PHYSICAL EXAMINATION:  Constitutional: Appears well-developed and well-nourished. No distress. HENT: Normocephalic. Marland Kitchen Oropharynx is clear and moist.  Eyes: Conjunctivae and EOM are normal. PERRLA, no scleral icterus.  Neck: Normal ROM. Neck supple. No JVD. No tracheal deviation. CVS: RRR, S1/S2 +, 3/6 murmurs, no gallops, no carotid bruit.  Pulmonary: Effort and breath sounds normal, no stridor, rhonchi, wheezes, rales.  Abdominal: Soft. BS +,  Some mild distension, no tenderness, rebound or guarding.  Musculoskeletal: Normal range of motion. No edema and no tenderness.  Neuro: Alert. CN 2-12 grossly intact. No focal deficits. Skin: Skin  is warm and dry. No rash noted. Psychiatric: Normal mood and affect.      LABORATORY PANEL:   CBC Recent Labs  Lab 07/07/19 0540  WBC 8.0  HGB 9.9*  HCT 31.7*  PLT 326   ------------------------------------------------------------------------------------------------------------------  Chemistries  Recent Labs  Lab 07/01/19 1710  07/07/19 0949  NA 136   < > 134*  K 4.3   < > 4.4  CL 95*   < > 101  CO2 31   < > 26  GLUCOSE 98   < > 144*  BUN 41*   < > 29*  CREATININE 1.64*   < > 1.61*  CALCIUM 9.2   < > 8.8*  MG  --    < > 2.0  AST 29  --   --   ALT 55*  --   --   ALKPHOS 109  --   --   BILITOT 1.4*  --   --    < > = values in this interval not displayed.   ------------------------------------------------------------------------------------------------------------------  Cardiac Enzymes No results for input(s): TROPONINI in the last 168 hours. ------------------------------------------------------------------------------------------------------------------  RADIOLOGY:  No results found.   ASSESSMENT AND PLAN:   53 year old male with history of chronic systolic heart failure ejection fraction 10% who presented to ED with shortness of breath.  1.  Acute hypoxic respiratory failure due to underlying acute on chronic systolic heart failure with EF of 10%: Patient will continue CRRT once temp cath exchanged due to catheter not functioning appropriately    2.  Severe ischemic cardiomyopathy EF 10 to 15%: Continue current management with CRRT  temp cath exchange due to catheter not functioning appropriately Apparently, Patient has been accepted  at [facility] and will be transferred  once bed available. Cardiology consultation appreciated  3.  Acute on chronic kidney disease stage III: Management as per cardiology. Etiology is due to cardiorenal syndrome.   D/w dr simonds   Management plans discussed with the patient and he is in agreement.  CODE  STATUS: full  TOTAL TIME TAKING CARE OF THIS PATIENT: 22 minutes.     POSSIBLE D/C today to [facility] when bed available.    Bettey Costa M.D on 07/07/2019 at 12:12 PM  Between 7am to 6pm - Pager - [phone] After 6pm go to [email or web address] - password [email or web address] [place] [facility]  Office  [phone]  CC: Primary care physician; Valerie Roys, DO  Note: This dictation was prepared with Dragon dictation along with smaller phrase technology. Any transcriptional errors that result from this process are unintentional.

## 2019-07-07 NOTE — Progress Notes (Signed)
No new complaints No distress on [place] O2  Vitals:   07/07/19 0743 07/07/19 0800 07/07/19 0900 07/07/19 1000  BP:  95/84 93/73 90/74   Pulse:  82 75 81  Resp:  19 16 20   Temp:      TempSrc:      SpO2: 98% 97% 98% 96%  Weight:      Height:      4 LPM [place]  Gen: NAD HEENT: NCAT, sclerae white Neck: No JVD Lungs: breath sounds full, no wheezes.  Bibasilar crackles Cardiovascular: Regular, no murmurs noted Abdomen: Soft, nontender, normal BS Ext: Warm, no edema Neuro: grossly intact Skin: Limited exam, no lesions noted   BMP Latest Ref Rng & Units 07/07/2019 07/07/2019 07/06/2019  Glucose 70 - 99 mg/dL 144(H) 119(H) 153(H)  BUN 6 - 20 mg/dL 29(H) 31(H) 33(H)  Creatinine 0.61 - 1.24 mg/dL 1.61(H) 1.46(H) 1.65(H)  BUN/Creat Ratio 9 - 20 - - -  Sodium 135 - 145 mmol/L 134(L) 136 135  Potassium 3.5 - 5.1 mmol/L 4.4 4.4 4.5  Chloride 98 - 111 mmol/L 101 102 101  CO2 22 - 32 mmol/L 26 26 26   Calcium 8.9 - 10.3 mg/dL 8.8(L) 8.7(L) 8.9    CBC Latest Ref Rng & Units 07/07/2019 07/06/2019 07/05/2019  WBC 4.0 - 10.5 K/uL 8.0 8.5 11.6(H)  Hemoglobin 13.0 - 17.0 g/dL 9.9(L) 10.1(L) 10.3(L)  Hematocrit 39.0 - 52.0 % 31.7(L) 32.6(L) 32.3(L)  Platelets 150 - 400 K/uL 326 375 414(H)    CXR: NNF  IMPRESSION: Severe dilated cardiomyopathy AKI/CKD Acute hypoxemic respiratory failure due to pulmonary edema  PLAN/REC: Awaiting transfer to [facility] Dialysis per nephrology Continue supplemental oxygen and intermittent BiPAP Vas-Cath to be placed by vascular surgery today Discussed with [facility] transfer center.  If he can be taken off of CRRT, they can take him on their SDU  Merton Border, MD PCCM service Mobile [phone] Pager [phone] 07/07/2019 12:12 PM

## 2019-07-07 NOTE — Progress Notes (Signed)
[facility] Daily Progress Note   Subjective: 3 Days Post-Op: Ultrasound-guided placement of a right femoral trialysis catheter Trula Slade)  Nephrology and nursing report issues with temp dialysis catheter. Clotting at lower pressures. Vascular asked by nephrology for dialysis catheter exchange.   Patient without complaint this AM.   Objective: Vitals:   07/07/19 0411 07/07/19 0500 07/07/19 0740 07/07/19 0743  BP:   99/76   Pulse:   77   Resp:   20   Temp:   98 F (36.7 C)   TempSrc:      SpO2: 97%  98% 98%  Weight:  89.1 kg    Height:        Intake/Output Summary (Last 24 hours) at 07/07/2019 1013 Last data filed at 07/07/2019 0941 Gross per 24 hour  Intake 363.87 ml  Output 1070 ml  Net -706.13 ml   Physical Exam: A&Ox3 CV: RRR Pulmonary: CTA Bilaterally Abdomen: Soft, Nontender, Nondistended Vascular:  Right Temp Catheter: Currently running. No signs of infection, drainage or swelling.    Laboratory: CBC    Component Value Date/Time   WBC 8.0 07/07/2019 0540   HGB 9.9 (L) 07/07/2019 0540   HGB 14.7 05/15/2019 1346   HCT 31.7 (L) 07/07/2019 0540   HCT 45.5 05/15/2019 1346   PLT 326 07/07/2019 0540   PLT 254 05/15/2019 1346   BMET    Component Value Date/Time   NA 136 07/07/2019 0540   NA 137 05/15/2019 1346   K 4.4 07/07/2019 0540   CL 102 07/07/2019 0540   CO2 26 07/07/2019 0540   GLUCOSE 119 (H) 07/07/2019 0540   BUN 31 (H) 07/07/2019 0540   BUN 36 (H) 05/15/2019 1346   CREATININE 1.46 (H) 07/07/2019 0540   CALCIUM 8.7 (L) 07/07/2019 0540   GFRNONAA 54 (L) 07/07/2019 0540   GFRAA >60 07/07/2019 0540   Assessment/Planning: The patient is a 53 year old male with CHF now acute on chronic renal failure had temp dialysis catheter placed three days ago now with clotting issues 1) Asked by nephrology for temp cath exchange due to catheter not functioning appropriately 2) Procedure, risks and benefits explained to patient. All  questions answered. Patient consents to proceed.   Discussed with Tamera Stands PA-C 07/07/2019 10:13 AM

## 2019-07-07 NOTE — Progress Notes (Signed)
[facility]  ROUNDING NOTE   Subjective:   Weaned off bipap  CRRT. UF of 870mL.   Complications with dialysis catheter overnight. Seems to be working well with upper level of normal arterial pressures.   Objective:  Vital signs in last 24 hours:  Temp:  [97.6 F (36.4 C)-98.3 F (36.8 C)] 98 F (36.7 C) (07/07 0740) Pulse Rate:  [71-82] 77 (07/07 0740) Resp:  [20-42] 20 (07/07 0740) BP: (86-99)/(64-81) 99/76 (07/07 0740) SpO2:  [80 %-100 %] 98 % (07/07 0743) FiO2 (%):  [40 %] 40 % (07/07 0411) Weight:  [89.1 kg] 89.1 kg (07/07 0500)  Weight change: -0.4 kg Filed Weights   07/05/19 0419 07/06/19 0450 07/07/19 0500  Weight: 90.4 kg 89.5 kg 89.1 kg    Intake/Output: I/O last 3 completed shifts: In: 603.9 [P.O.:600; I.V.:3.9] Out: 2353 [Urine:300; IRWER:1540]   Intake/Output this shift:  Total I/O In: -  Out: 100 [Other:100]  Physical Exam: General: Critically Ill  Head: Normocephalic, atraumatic. Moist oral mucosal membranes  Eyes: Anicteric, PERRL  Neck: Supple, trachea midline  Lungs:  Bilateral crackles   Heart: Regular rate and rhythm  Abdomen:  Soft, nontender,   Extremities: no peripheral edema.  Neurologic: Nonfocal, moving all four extremities  Skin: No lesions  Access: Right femoral temp HD catheter, Dr. Trula Slade 7/4    Basic Metabolic Panel: Recent Labs  Lab 07/06/19 1119 07/06/19 1520 07/06/19 1607 07/06/19 1831 07/06/19 2317 07/07/19 0540  NA 137  --  136 134* 135 136  K 5.0  --  4.9 4.6 4.5 4.4  CL 101  --  101 100 101 102  CO2 27  --  26 23 26 26   GLUCOSE 130*  --  132* 128* 153* 119*  BUN 28*  --  31* 32* 33* 31*  CREATININE 1.42*  --  1.57* 1.46* 1.65* 1.46*  CALCIUM 9.0  --  9.0 8.8* 8.9 8.7*  MG 2.0 0.7* 2.1 2.0 2.0 2.0  PHOS 2.1*  --  2.0* 2.0* 1.9* 1.9*    Liver Function Tests: Recent Labs  Lab 07/01/19 1710  07/06/19 1119 07/06/19 1607 07/06/19 1831 07/06/19 2317 07/07/19 0540  AST 29  --   --   --   --    --   --   ALT 55*  --   --   --   --   --   --   ALKPHOS 109  --   --   --   --   --   --   BILITOT 1.4*  --   --   --   --   --   --   PROT 7.4  --   --   --   --   --   --   ALBUMIN 2.7*   < > 2.2* 2.1* 2.1* 2.2* 2.2*   < > = values in this interval not displayed.   No results for input(s): LIPASE, AMYLASE in the last 168 hours. No results for input(s): AMMONIA in the last 168 hours.  CBC: Recent Labs  Lab 07/01/19 1710 07/05/19 0418 07/06/19 0523 07/07/19 0540  WBC 13.7* 11.6* 8.5 8.0  NEUTROABS  --   --   --  5.8  HGB 12.0* 10.3* 10.1* 9.9*  HCT 37.0* 32.3* 32.6* 31.7*  MCV 86.9 87.5 89.3 89.5  PLT 547* 414* 375 326    Cardiac Enzymes: No results for input(s): CKTOTAL, CKMB, CKMBINDEX, TROPONINI in the last 168 hours.  BNP:  Invalid input(s): POCBNP  CBG: Recent Labs  Lab 07/04/19 1422  GLUCAP 133*    Microbiology: Results for orders placed or performed during the hospital encounter of 07/01/19  SARS Coronavirus 2 (CEPHEID- Performed in [place] hospital lab), Hosp Order     Status: None   Collection Time: 07/01/19 10:54 PM   Specimen: Nasopharyngeal Swab  Result Value Ref Range Status   SARS Coronavirus 2 NEGATIVE NEGATIVE Final    Comment: (NOTE) If result is NEGATIVE SARS-CoV-2 target nucleic acids are NOT DETECTED. The SARS-CoV-2 RNA is generally detectable in upper and lower  respiratory specimens during the acute phase of infection. The lowest  concentration of SARS-CoV-2 viral copies this assay can detect is 250  copies / mL. A negative result does not preclude SARS-CoV-2 infection  and should not be used as the sole basis for treatment or other  patient management decisions.  A negative result may occur with  improper specimen collection / handling, submission of specimen other  than nasopharyngeal swab, presence of viral mutation(s) within the  areas targeted by this assay, and inadequate number of viral copies  (<250 copies / mL). A negative  result must be combined with clinical  observations, patient history, and epidemiological information. If result is POSITIVE SARS-CoV-2 target nucleic acids are DETECTED. The SARS-CoV-2 RNA is generally detectable in upper and lower  respiratory specimens dur ing the acute phase of infection.  Positive  results are indicative of active infection with SARS-CoV-2.  Clinical  correlation with patient history and other diagnostic information is  necessary to determine patient infection status.  Positive results do  not rule out bacterial infection or co-infection with other viruses. If result is PRESUMPTIVE POSTIVE SARS-CoV-2 nucleic acids MAY BE PRESENT.   A presumptive positive result was obtained on the submitted specimen  and confirmed on repeat testing.  While 2019 novel coronavirus  (SARS-CoV-2) nucleic acids may be present in the submitted sample  additional confirmatory testing may be necessary for epidemiological  and / or clinical management purposes  to differentiate between  SARS-CoV-2 and other Sarbecovirus currently known to infect humans.  If clinically indicated additional testing with an alternate test  methodology [phone]) is advised. The SARS-CoV-2 RNA is generally  detectable in upper and lower respiratory sp ecimens during the acute  phase of infection. The expected result is Negative. Fact Sheet for Patients:  StrictlyIdeas.no Fact Sheet for Healthcare Providers: BankingDealers.co.za This test is not yet approved or cleared by the [place] FDA and has been authorized for detection and/or diagnosis of SARS-CoV-2 by FDA under an Emergency Use Authorization (EUA).  This EUA will remain in effect (meaning this test can be used) for the duration of the COVID-19 declaration under Section 564(b)(1) of the Act, 21 U.S.C. section 360bbb-3(b)(1), unless the authorization is terminated or revoked sooner. Performed at  [facility], [address] [place], [place] [address]   Body fluid culture     Status: None   Collection Time: 07/01/19 11:52 PM   Specimen: KNEE; Body Fluid  Result Value Ref Range Status   Specimen Description   Final    KNEE Performed at [facility], [address] [place], [place] [address]    Special Requests   Final    NONE Performed at [facility], [address] [place], [place] [address]    Gram Stain   Final    MODERATE WBC PRESENT, PREDOMINANTLY PMN NO ORGANISMS SEEN    Culture  Final    NO GROWTH 3 DAYS Performed at [facility], [place] [address] [place], [place] [address]    Report Status 07/05/2019 FINAL  Final  CULTURE, BLOOD (ROUTINE X 2) w Reflex to ID Panel     Status: None (Preliminary result)   Collection Time: 07/05/19  6:07 PM   Specimen: BLOOD  Result Value Ref Range Status   Specimen Description BLOOD BLOOD RIGHT ARM  Final   Special Requests   Final    BOTTLES DRAWN AEROBIC AND ANAEROBIC Blood Culture adequate volume   Culture   Final    NO GROWTH 2 DAYS Performed at [facility], [address], [place], [place] [address]    Report Status PENDING  Incomplete  CULTURE, BLOOD (ROUTINE X 2) w Reflex to ID Panel     Status: None (Preliminary result)   Collection Time: 07/05/19  6:10 PM   Specimen: BLOOD  Result Value Ref Range Status   Specimen Description BLOOD PORTA CATH  Final   Special Requests   Final    BOTTLES DRAWN AEROBIC AND ANAEROBIC Blood Culture adequate volume   Culture   Final    NO GROWTH 2 DAYS Performed at [facility] [address], [place], [place] [address]    Report Status PENDING  Incomplete    Coagulation Studies: No results for input(s): LABPROT, INR in the last 72 hours.  Urinalysis: No results for input(s): COLORURINE, LABSPEC, PHURINE, GLUCOSEU, HGBUR, BILIRUBINUR, KETONESUR, PROTEINUR, UROBILINOGEN, NITRITE, LEUKOCYTESUR in the last 72  hours.  Invalid input(s): APPERANCEUR    Imaging: No results found.   Medications:   . sodium chloride    . sodium chloride    . sodium chloride    . heparin 300 Units/hr (07/06/19 1800)  . pureflow 3 each (07/06/19 1645)   . allopurinol  200 mg Oral Daily  . amiodarone  200 mg Oral BID  . aspirin  81 mg Oral Daily  . atorvastatin  40 mg Oral q1800  . Chlorhexidine Gluconate Cloth  6 each Topical Q0600  . clopidogrel  75 mg Oral Daily  . feeding supplement (NEPRO CARB STEADY)  237 mL Oral BID BM  . furosemide  40 mg Intravenous Once  . gabapentin  100 mg Oral BID  . heparin injection (subcutaneous)  5,000 Units Subcutaneous Q8H  . ipratropium-albuterol  3 mL Nebulization Q6H  . magnesium oxide  400 mg Oral BID  . midodrine  10 mg Oral TID WC  . multivitamin with minerals  1 tablet Oral Daily  . senna-docusate  1 tablet Oral Daily  . sodium chloride flush  3 mL Intravenous Q12H   sodium chloride, sodium chloride, sodium chloride, acetaminophen, cyclobenzaprine, guaiFENesin, heparin, heparin, lidocaine (PF), lidocaine-prilocaine, lip balm, menthol-cetylpyridinium, nitroGLYCERIN, ondansetron (ZOFRAN) IV, pentafluoroprop-tetrafluoroeth, phenol, sodium chloride flush  Assessment/ Plan:  [name] is a 53 y.o. black male withchronic systolic heart failure ejection fraction 10-15%, prior history of cardiac arrest, hyperlipidemia, nephrolithiasis, hypertension, history of TIA, was admitted to [facility] on7/12/2018 for acute exacerbation of systolic congestive heart failure   Placed on renal replacement therapy on 7/4. Intermittent hemodialysis with UF of 566mL on 7/4. Placed on CRRT 7/5.   Pending transfer to [facility].   1. Acute renal failure with hyperkalemia and metabolic acidosis onChronic kidney disease stage III  Required hemodialysis on last admission Baseline creatinine 1.94, GFR of 38 on 05/30/19.  Chronic Kidney disease secondary to hypertension Acute renal  failure secondary to  acute cardiorenal syndrome.  - Continue CRRT - Increase ultrafiltration to 129mL/hr   2. Complication of dialysis device - Consult vascular   2. Acute on chronic systolic heart failure - Appreciate cardiology.   3. Acute Respiratory Failure: weaned off BIPAP   LOS: 5 Judit Awad 7/7/20209:12 AM

## 2019-07-07 NOTE — Progress Notes (Signed)
Ch visited pt while rounding in ICU. Pt was alert and oriented while having lunch. Pt seemed to be glad that he would be transferred to [facility] when a bed become available (wait listed). Ch is aware that the pt has been trying to receive care from [facility] for a while regarding beginning the process of being placed on the list for a heart transplant. CH is concerned that pt may not be stable enough to be even be considered. Pt has a family hx of heart failure (dad). Ch provided words of encouragement as pt shared that his sister is reluctant to visit in the hospital b/c of COVID-19. Pt does have support of family. Pt shared that he would not need to be dialyzed permanently which ch saw as a sign of improvement. Pt presented to be a bit weaker than previous encounters w/ ch.    F/U would be to determine the status ofd pt transfer w/ care team.    07/07/19 1200  Clinical Encounter Type  Visited With Patient  Visit Type Follow-up

## 2019-07-07 NOTE — Progress Notes (Signed)
Spoke with Dr. Juleen China regarding patients HD catheter. Pressures have been running in the low 200's after flushing and repositioning patient. At this time per MD we will hold on catheter exchange as it has been running with no complications. Per MD we will transition patient off of CRRT to HD MWF. Sister Nancie Neas updated this am. Awaiting for transfer to [place].

## 2019-07-07 NOTE — Progress Notes (Signed)
Called [facility] transfer center. Per Micheal (intake representative) patient will need to be off of CRRT for 24 hours. Stopped CRRT, Dr. Juleen China will place HD orders for tomorrow.

## 2019-07-07 NOTE — Progress Notes (Signed)
Pt restarted on CRRT at 4am post TPA of lines venous line remains sluggish. Heparin reconnected to line . Pt has been from BiPAP and [place] and hi-flo. Pt decompensates easily and becomes intermittently SOB.

## 2019-07-07 NOTE — Progress Notes (Signed)
Patient's sister called with password- requesting cardiology to call her with some questions she has. Spoke with Dr Ubaldo Glassing, he will be giving her a call to answere her questions. Patient is alert and oriented. No complaints or shortness of breath or pain. Tolerating nasal cannula. Patient has put out 50 ml of dark amber urine. Patient is tolerating CRRT. Per Dr. Juleen China increased ultrafiltration to 125. Awaiting for [facility] to have an open bed for transfer.

## 2019-07-08 DIAGNOSIS — I420 Dilated cardiomyopathy: Secondary | ICD-10-CM

## 2019-07-08 DIAGNOSIS — N186 End stage renal disease: Secondary | ICD-10-CM

## 2019-07-08 LAB — RENAL FUNCTION PANEL
Albumin: 2.2 g/dL — ABNORMAL LOW (ref 3.5–5.0)
Anion gap: 9 (ref 5–15)
BUN: 34 mg/dL — ABNORMAL HIGH (ref 6–20)
CO2: 25 mmol/L (ref 22–32)
Calcium: 8.7 mg/dL — ABNORMAL LOW (ref 8.9–10.3)
Chloride: 102 mmol/L (ref 98–111)
Creatinine, Ser: 2.27 mg/dL — ABNORMAL HIGH (ref 0.61–1.24)
GFR calc Af Amer: 37 mL/min — ABNORMAL LOW (ref >60)
GFR calc non Af Amer: 32 mL/min — ABNORMAL LOW (ref >60)
Glucose, Bld: 132 mg/dL — ABNORMAL HIGH (ref 70–99)
Phosphorus: 3.3 mg/dL (ref 2.5–4.6)
Potassium: 4.7 mmol/L (ref 3.5–5.1)
Sodium: 136 mmol/L (ref 135–145)

## 2019-07-08 LAB — MAGNESIUM: Magnesium: 2 mg/dL (ref 1.7–2.4)

## 2019-07-08 NOTE — Progress Notes (Signed)
Nutrition Follow-up  DOCUMENTATION CODES:   Not applicable  INTERVENTION:  Continue Nepro Shake po BID, each supplement provides 425 kcal and 19 grams protein.  Continue daily MVI.  NUTRITION DIAGNOSIS:   Inadequate oral intake related to acute illness as evidenced by per patient/family report.  Resolving.  GOAL:   Patient will meet greater than or equal to 90% of their needs  Progressing.  MONITOR:   PO intake, Supplement acceptance, Labs, Weight trends, Skin, I & O's  REASON FOR ASSESSMENT:   Malnutrition Screening Tool    ASSESSMENT:   53 year old male with PMHx of HTN, hx TIA, CHF, s/p placement of AICD, COPD, CKD III, arthritis, gout admitted with acute on chronic systolic CHF.  Patient is now off CRRT. Plan is for intermittent HD later today. His appetite and intake have improved. He has been eating 75-100% of meals and yesterday ate 100% of all three meals. He is drinking about one bottle of Nepro per day but some day refuses. Plan is for patient to transfer to [place] later today.  Medications reviewed and include: allopurinol, amiodarone, Colace 100 mg BID, gabapentin, magnesium oxide 400 mg BID, MVI daily, senna-docusate 1 tablet daily.  Labs reviewed: BUN 34, Creatinine 2.27. Electrolytes WNL.  Diet Order:   Diet Order            Diet 2 gram sodium Room service appropriate? Yes; Fluid consistency: Thin; Fluid restriction: 1800 mL Fluid  Diet effective now             EDUCATION NEEDS:   Education needs have been addressed  Skin:  Skin Assessment: Reviewed RN Assessment(incision knee)  Last BM:  07/01/2019 per chart  Height:   Ht Readings from Last 1 Encounters:  07/04/19 6\' 2"  (1.88 m)   Weight:   Wt Readings from Last 1 Encounters:  07/11/2019 89.5 kg   Ideal Body Weight:  83.6 kg  BMI:  Body mass index is 25.33 kg/m.  Estimated Nutritional Needs:   Kcal:  2200-2500kcal/day  Protein:  115-125g/day  Fluid:  2L/day  Willey Blade,  MS, RD, LDN Office: [phone] Pager: [phone] After Hours/Weekend Pager: [phone]

## 2019-07-08 NOTE — Progress Notes (Signed)
[place] [facility] [place] NAME: Stephen [name]    MR#:  [id number]  DATE OF BIRTH:  06-07-1966  SUBJECTIVE:  Plan to tx to [facility] today off off CRRT on HD today  REVIEW OF SYSTEMS:    Review of Systems  Constitutional: Negative for fever, chills weight loss HENT: Negative for ear pain, nosebleeds, congestion, facial swelling, rhinorrhea, neck pain, neck stiffness and ear discharge.   Respiratory: Negative for cough, shortness of breath, wheezing  Cardiovascular: Negative for chest pain, palpitations and ++leg swelling.  Gastrointestinal: Negative for heartburn, abdominal pain, vomiting, diarrhea or consitpation Genitourinary: Negative for dysuria, urgency, frequency, hematuria Musculoskeletal: Negative for back pain or joint pain Neurological: Negative for dizziness, seizures, syncope, focal weakness,  numbness and headaches.  Hematological: Does not bruise/bleed easily.  Psychiatric/Behavioral: Negative for hallucinations, confusion, dysphoric mood    Tolerating Diet: yes      DRUG ALLERGIES:   Allergies  Allergen Reactions  . Lisinopril Cough    VITALS:  Blood pressure 102/86, pulse 84, temperature 98.1 F (36.7 C), temperature source Oral, resp. rate (!) 30, height 6\' 2"  (1.88 m), weight 89.5 kg, SpO2 94 %.  PHYSICAL EXAMINATION:  Constitutional: Appears well-developed and well-nourished. No distress. HENT: Normocephalic. Marland Kitchen Oropharynx is clear and moist.  Eyes: Conjunctivae and EOM are normal. PERRLA, no scleral icterus.  Neck: Normal ROM. Neck supple. No JVD. No tracheal deviation. CVS: RRR, S1/S2 +, 3/6 murmurs, no gallops, no carotid bruit.  Pulmonary: Effort and breath sounds normal, no stridor, rhonchi, wheezes, rales.  Abdominal: Soft. BS +,  Some mild distension, no tenderness, rebound or guarding.  Musculoskeletal: Normal range of motion. 1+LEE and no tenderness.  Neuro: Alert. CN 2-12 grossly intact. No focal  deficits. Skin: Skin is warm and dry. No rash noted. Psychiatric: Normal mood and affect.      LABORATORY PANEL:   CBC Recent Labs  Lab 07/07/19 0540  WBC 8.0  HGB 9.9*  HCT 31.7*  PLT 326   ------------------------------------------------------------------------------------------------------------------  Chemistries  Recent Labs  Lab 07/01/19 1710  07/28/2019 0424  NA 136   < > 136  K 4.3   < > 4.7  CL 95*   < > 102  CO2 31   < > 25  GLUCOSE 98   < > 132*  BUN 41*   < > 34*  CREATININE 1.64*   < > 2.27*  CALCIUM 9.2   < > 8.7*  MG  --    < > 2.0  AST 29  --   --   ALT 55*  --   --   ALKPHOS 109  --   --   BILITOT 1.4*  --   --    < > = values in this interval not displayed.   ------------------------------------------------------------------------------------------------------------------  Cardiac Enzymes No results for input(s): TROPONINI in the last 168 hours. ------------------------------------------------------------------------------------------------------------------  RADIOLOGY:  No results found.   ASSESSMENT AND PLAN:   53 year old male with history of chronic systolic heart failure ejection fraction 10% who presented to ED with shortness of breath.  1.  Acute hypoxic respiratory failure due to underlying acute on chronic systolic heart failure with EF of 10%: Patient plan for hemodialysis today.   2.  Severe ischemic cardiomyopathy EF 10 to 15%: Patient plan for hemodialysis today. He has been [facility] to [facility] for [facility] issues.  3.  Acute on chronic kidney disease stage III: Management as per cardiology. Etiology is due to cardiorenal  syndrome.   D/w dr simonds   Management plans discussed with the patient and he is in agreement.  CODE STATUS: full  TOTAL TIME TAKING CARE OF THIS PATIENT: 22 minutes.     POSSIBLE D/C today to [facility] when bed available.    Bettey Costa M.D on 07/17/2019 at 11:50 AM  Between 7am to 6pm - Pager -  [phone] After 6pm go to [email or web address] - password [email or web address] [facility]  Office  [phone]  CC: Primary care physician; Valerie Roys, DO  Note: This dictation was prepared with Dragon dictation along with smaller phrase technology. Any transcriptional errors that result from this process are unintentional.

## 2019-07-08 NOTE — Progress Notes (Signed)
Palliative: Mr. Stcyr, to him, is resting quietly in bed.  He appears acutely/chronically ill and quite frail.  He will open his eyes and briefly make but not keep eye contact.  There is no family at bedside at this time due to visitor restrictions.  We talked about his plan to transfer to [facility] later today.  We talked about LVAD evaluation.  We talked about his sister and their support of one another.  Conference with nursing staff/CCM provider related to patient condition, needs, transfer.  Plan: Transfer to [facility] later today.  Encourage palliative medical team at [facility] to continue to follow.  79 minutes Quinn Axe, NP Palliative Medicine Team Team Phone # [phone] Greater than 50% of this time was spent counseling and coordinating care related to the above assessment and plan.

## 2019-07-08 NOTE — Progress Notes (Signed)
[facility]  ROUNDING NOTE   Subjective:   Hemodynamically stable. Not requiring vasopressors.   Taken off CRRT at 6pm yesterday. UF of 1275mL in last 24 hours.   Patient complains of shortness of breath and feels fluid building back up.   Objective:  Vital signs in last 24 hours:  Temp:  [97.5 F (36.4 C)-98.5 F (36.9 C)] 98 F (36.7 C) (07/08 0200) Pulse Rate:  [66-82] 80 (07/08 0700) Resp:  [16-48] 29 (07/08 0700) BP: (86-110)/(66-87) 98/82 (07/08 0700) SpO2:  [91 %-100 %] 95 % (07/08 0700) FiO2 (%):  [40 %] 40 % (07/07 2045) Weight:  [89.5 kg] 89.5 kg (07/08 0422)  Weight change: 0.4 kg Filed Weights   07/06/19 0450 07/07/19 0500 07/07/2019 0422  Weight: 89.5 kg 89.1 kg 89.5 kg    Intake/Output: I/O last 3 completed shifts: In: 912.6 [P.O.:600; I.V.:52.2; IV Piggyback:260.4] Out: 1730 [Urine:150; Other:1580]   Intake/Output this shift:  No intake/output data recorded.  Physical Exam: General: Ill, laying in bed  Head: Normocephalic, atraumatic. Moist oral mucosal membranes  Eyes: Anicteric, PERRL  Neck: Supple, trachea midline  Lungs:  Bilateral crackles   Heart: Regular rate and rhythm, +murmur  Abdomen:  Soft, nontender,   Extremities: no peripheral edema.  Neurologic: Nonfocal, moving all four extremities  Skin: No lesions  Access: Right femoral temp HD catheter, Dr. Trula Slade 7/4    Basic Metabolic Panel: Recent Labs  Lab 07/07/19 0540 07/07/19 0949 07/07/19 1327 07/07/19 1538 07/07/19 1734 07/07/19 1948 07/17/2019 0424  NA 136 134* 134*  --  135 138 136  K 4.4 4.4 4.5  --  4.4 4.9 4.7  CL 102 101 99  --  101 101 102  CO2 26 26 27   --  25 27 25   GLUCOSE 119* 144* 142*  --  141* 125* 132*  BUN 31* 29* 26*  --  24* 26* 34*  CREATININE 1.46* 1.61* 1.63*  --  1.52* 1.83* 2.27*  CALCIUM 8.7* 8.8* 8.8*  --  8.8* 9.2 8.7*  MG 2.0 2.0  --  1.9  --  2.2 2.0  PHOS 1.9* 1.9* 3.2  --  3.5 3.1 3.3    Liver Function Tests: Recent Labs   Lab 07/01/19 1710  07/07/19 0949 07/07/19 1327 07/07/19 1734 07/07/19 1948 07/07/2019 0424  AST 29  --   --   --   --   --   --   ALT 55*  --   --   --   --   --   --   ALKPHOS 109  --   --   --   --   --   --   BILITOT 1.4*  --   --   --   --   --   --   PROT 7.4  --   --   --   --   --   --   ALBUMIN 2.7*   < > 2.1* 2.2* 2.2* 2.3* 2.2*   < > = values in this interval not displayed.   No results for input(s): LIPASE, AMYLASE in the last 168 hours. No results for input(s): AMMONIA in the last 168 hours.  CBC: Recent Labs  Lab 07/01/19 1710 07/05/19 0418 07/06/19 0523 07/07/19 0540  WBC 13.7* 11.6* 8.5 8.0  NEUTROABS  --   --   --  5.8  HGB 12.0* 10.3* 10.1* 9.9*  HCT 37.0* 32.3* 32.6* 31.7*  MCV 86.9 87.5 89.3 89.5  PLT  547* 414* 375 326    Cardiac Enzymes: No results for input(s): CKTOTAL, CKMB, CKMBINDEX, TROPONINI in the last 168 hours.  BNP: Invalid input(s): POCBNP  CBG: Recent Labs  Lab 07/04/19 1422  GLUCAP 133*    Microbiology: Results for orders placed or performed during the hospital encounter of 07/01/19  SARS Coronavirus 2 (CEPHEID- Performed in [place] hospital lab), Hosp Order     Status: None   Collection Time: 07/01/19 10:54 PM   Specimen: Nasopharyngeal Swab  Result Value Ref Range Status   SARS Coronavirus 2 NEGATIVE NEGATIVE Final    Comment: (NOTE) If result is NEGATIVE SARS-CoV-2 target nucleic acids are NOT DETECTED. The SARS-CoV-2 RNA is generally detectable in upper and lower  respiratory specimens during the acute phase of infection. The lowest  concentration of SARS-CoV-2 viral copies this assay can detect is 250  copies / mL. A negative result does not preclude SARS-CoV-2 infection  and should not be used as the sole basis for treatment or other  patient management decisions.  A negative result may occur with  improper specimen collection / handling, submission of specimen other  than nasopharyngeal swab, presence of viral  mutation(s) within the  areas targeted by this assay, and inadequate number of viral copies  (<250 copies / mL). A negative result must be combined with clinical  observations, patient history, and epidemiological information. If result is POSITIVE SARS-CoV-2 target nucleic acids are DETECTED. The SARS-CoV-2 RNA is generally detectable in upper and lower  respiratory specimens dur ing the acute phase of infection.  Positive  results are indicative of active infection with SARS-CoV-2.  Clinical  correlation with patient history and other diagnostic information is  necessary to determine patient infection status.  Positive results do  not rule out bacterial infection or co-infection with other viruses. If result is PRESUMPTIVE POSTIVE SARS-CoV-2 nucleic acids MAY BE PRESENT.   A presumptive positive result was obtained on the submitted specimen  and confirmed on repeat testing.  While 2019 novel coronavirus  (SARS-CoV-2) nucleic acids may be present in the submitted sample  additional confirmatory testing may be necessary for epidemiological  and / or clinical management purposes  to differentiate between  SARS-CoV-2 and other Sarbecovirus currently known to infect humans.  If clinically indicated additional testing with an alternate test  methodology [phone]) is advised. The SARS-CoV-2 RNA is generally  detectable in upper and lower respiratory sp ecimens during the acute  phase of infection. The expected result is Negative. Fact Sheet for Patients:  StrictlyIdeas.no Fact Sheet for Healthcare Providers: BankingDealers.co.za This test is not yet approved or cleared by the [place] FDA and has been authorized for detection and/or diagnosis of SARS-CoV-2 by FDA under an Emergency Use Authorization (EUA).  This EUA will remain in effect (meaning this test can be used) for the duration of the COVID-19 declaration under Section 564(b)(1)  of the Act, 21 U.S.C. section 360bbb-3(b)(1), unless the authorization is terminated or revoked sooner. Performed at [facility], [address] [place], [place] [address]   Body fluid culture     Status: None   Collection Time: 07/01/19 11:52 PM   Specimen: KNEE; Body Fluid  Result Value Ref Range Status   Specimen Description   Final    KNEE Performed at [facility], [address] [place], [place] [address]    Special Requests   Final    NONE Performed at [facility], [address] [place],  [address]  Gram Stain   Final    MODERATE WBC PRESENT, PREDOMINANTLY PMN NO ORGANISMS SEEN    Culture   Final    NO GROWTH 3 DAYS Performed at [facility] [address] [place], [place] [address]    Report Status 07/05/2019 FINAL  Final  CULTURE, BLOOD (ROUTINE X 2) w Reflex to ID Panel     Status: None (Preliminary result)   Collection Time: 07/05/19  6:07 PM   Specimen: BLOOD  Result Value Ref Range Status   Specimen Description BLOOD BLOOD RIGHT ARM  Final   Special Requests   Final    BOTTLES DRAWN AEROBIC AND ANAEROBIC Blood Culture adequate volume   Culture   Final    NO GROWTH 3 DAYS Performed at [facility], [address] [place], [place] [address]    Report Status PENDING  Incomplete  CULTURE, BLOOD (ROUTINE X 2) w Reflex to ID Panel     Status: None (Preliminary result)   Collection Time: 07/05/19  6:10 PM   Specimen: BLOOD  Result Value Ref Range Status   Specimen Description BLOOD PORTA CATH  Final   Special Requests   Final    BOTTLES DRAWN AEROBIC AND ANAEROBIC Blood Culture adequate volume   Culture   Final    NO GROWTH 3 DAYS Performed at [facility], [address], [place],  [address]    Report Status PENDING  Incomplete    Coagulation Studies: No results for input(s): LABPROT, INR in the last 72 hours.  Urinalysis: No results for input(s): COLORURINE, LABSPEC,  PHURINE, GLUCOSEU, HGBUR, BILIRUBINUR, KETONESUR, PROTEINUR, UROBILINOGEN, NITRITE, LEUKOCYTESUR in the last 72 hours.  Invalid input(s): APPERANCEUR    Imaging: No results found.   Medications:   . sodium chloride    . sodium chloride    . sodium chloride    . heparin Stopped (07/07/19 0017)   . allopurinol  200 mg Oral Daily  . amiodarone  200 mg Oral BID  . aspirin  81 mg Oral Daily  . atorvastatin  40 mg Oral q1800  . Chlorhexidine Gluconate Cloth  6 each Topical Q0600  . clopidogrel  75 mg Oral Daily  . docusate sodium  100 mg Oral BID  . feeding supplement (NEPRO CARB STEADY)  237 mL Oral BID BM  . furosemide  40 mg Intravenous Once  . gabapentin  100 mg Oral BID  . heparin injection (subcutaneous)  5,000 Units Subcutaneous Q8H  . ipratropium-albuterol  3 mL Nebulization Q6H  . magnesium oxide  400 mg Oral BID  . midodrine  10 mg Oral TID WC  . multivitamin with minerals  1 tablet Oral Daily  . senna-docusate  1 tablet Oral Daily  . sodium chloride flush  3 mL Intravenous Q12H   sodium chloride, sodium chloride, sodium chloride, acetaminophen, bisacodyl, cyclobenzaprine, guaiFENesin, heparin, heparin, lidocaine (PF), lidocaine-prilocaine, lip balm, menthol-cetylpyridinium, nitroGLYCERIN, ondansetron (ZOFRAN) IV, pentafluoroprop-tetrafluoroeth, phenol, sodium chloride flush  Assessment/ Plan:  [name] is a 26 y.o. black male withchronic systolic heart failure ejection fraction 10-15%, prior history of cardiac arrest, hyperlipidemia, nephrolithiasis, hypertension, history of TIA, was admitted to [facility] on7/12/2018 for acute exacerbation of systolic congestive heart failure   Placed on renal replacement therapy on 7/4. Intermittent hemodialysis with UF of 543mL on 7/4. Placed on CRRT 7/5-7/7.   Pending transfer to [facility].   1. Acute renal failure with hyperkalemia and metabolic acidosis onChronic kidney disease stage III  Required hemodialysis  on last 2  admissions Baseline creatinine 1.94, GFR of 38 on 05/30/19.  Chronic Kidney disease secondary to hypertension Acute renal failure secondary to acute cardiorenal syndrome.  - Intermittent hemodialysis for later today.  - Plan for tunneled catheter placement by vascular for tomorrow if still here at [facility].   2. Acute on chronic systolic heart failure. Anuric and not responding to diuretics.  - Appreciate cardiology.   3. Acute Respiratory Failure: weaned off BIPAP - requiring supplemental oxygen.    LOS: 6 Tarrell Debes 7/8/20208:50 AM

## 2019-07-08 NOTE — Discharge Summary (Signed)
PULMONARY / CRITICAL CARE MEDICINE  Name: TERRIE GRAJALES MRN: [id number] DOB: May 12, 1966    LOS: 6  Admitting diagnosis:  Discharge Diagnosis : Acute renal failure requiring emergent dialysis  HPI:  53 y/o male admitted with acute systolic CHF exacerbation, acute hypoxic respiratory failure, left knee effusion and CKD stage III; now in acute renal failure with hyperkalemia and metabolic acidosis respiratory failure. He underwent emergent HD and was transitioned to CVVHD. He remains ob BiPAP with an FiO2 of 40%. See  admission H & P for rest of the HPI.   Hospital Course: This is a 53 year old African-American male with a medical history as indicated below, severe nonischemic cardiomyopathy with an ejection fraction of 10 to 15% and an AICD, who was admitted on 07/01/2019 with increased lower extremity edema, dyspnea and left knee effusion.  He underwent a right knee aspiration in the ED.  His initial blood pressure was low but improved while in the ED.  His ED work-up was significant for proBNP of 1793 and diffuse interstitial edema on chest x-ray.  He was admitted with acute systolic heart failure with exacerbation and started on IV diuresis with lasix infusion.  He was seen by cardiology and nephrology for worsening renal failure.  Due to decrease in his urine output, the decision was made to start hemodialysis hence he was transferred to the ICU for further management. Underwent emergent hemodialysis on 07/04/2019 pm and was transitioned to CVVHD on 07/05/2019.  He was started on Unasyn on 07/05/2019 for possible aspiration pneumonitis.  His blood pressure are marginal but his MAPs are 65 and above. His mental status has improved but he remains hypoxic requiring continuous BiPAP and desaturates when off BiPAP.  Per Dr Ubaldo Glassing, plan was to discuss with the [facility] advanced heart failure team as patient had an upcoming appointment in 2 weeks. However, patient expressed the desire to be  transferred to [facility] as he doesn't feel that he will be out of the hospital long enough to attend the appointment. The [facility] transfer center was contacted and patient was accepted for transfer. Accepting MD is Dr. Lillia Corporal at the cardiac ICU. He is awaiting a bed assignment.   Past Medical History:  Diagnosis Date  . AICD (automatic cardioverter/defibrillator) present   . AKI (acute kidney injury) ([place]) 12/24/2017  . Arrhythmia   . Arthritis    "lower back, knees" (06/10/2018)  . Cardiac arrest ([place]) 12/23/2017   Brief V-fib arrest  . Chest pain 09/08/2017  . CHF (congestive heart failure) (HCC)    nonischemic cardiomyopathy, EF 25%  . Gout    "on daily RX" (06/10/2018)  . Headache    "q couple months" (06/10/2018)  . High cholesterol   . History of kidney stones   . Hypertension   . Influenza A 12/24/2017  . NICM (nonischemic cardiomyopathy) ([place])   . Pneumonia 12/20/2017  . TIA (transient ischemic attack) 06/21/2016   "still affects my memory a little bit" (06/10/2018)   Past Surgical History:  Procedure Laterality Date  . EXTERNAL FIXATION LEG Right ~ 2000   "was going bowlegged; had to brake my leg to fix it"  . HERNIA REPAIR    . ICD IMPLANT N/A 12/30/2017   Procedure: ICD IMPLANT;  Surgeon: Deboraha Sprang, MD;  Location: [facility];  Service: Cardiovascular;  Laterality: N/A;  . LEFT HEART CATH AND CORONARY ANGIOGRAPHY N/A 09/09/2017   Procedure: LEFT HEART CATH AND CORONARY ANGIOGRAPHY;  Surgeon: Teodoro Spray,  MD;  Location: [facility];  Service: Cardiovascular;  Laterality: N/A;  . TEMPORARY DIALYSIS CATHETER N/A 07/04/2019   Procedure: TEMPORARY DIALYSIS CATHETER;  Surgeon: Serafina Mitchell, MD;  Location: [facility];  Service: Cardiovascular;  Laterality: N/A;  . UMBILICAL HERNIA REPAIR  1990s  . V TACH ABLATION  06/10/2018  . V TACH ABLATION N/A 06/10/2018   Procedure: V TACH ABLATION;  Surgeon: Thompson Grayer, MD;  Location: [facility];   Service: Cardiovascular;  Laterality: N/A;  . VASECTOMY     No current facility-administered medications on file prior to encounter.    Current Outpatient Medications on File Prior to Encounter  Medication Sig  . acetaminophen (TYLENOL) 325 MG tablet Take 2 tablets (650 mg total) by mouth every 6 (six) hours as needed. (Patient taking differently: Take 325-650 mg by mouth. )  . albuterol (PROVENTIL HFA;VENTOLIN HFA) 108 (90 Base) MCG/ACT inhaler Inhale 2 puffs into the lungs every 4 (four) hours as needed for wheezing or shortness of breath.  . allopurinol (ZYLOPRIM) 100 MG tablet Take 2 tablets (200 mg total) by mouth daily.  Marland Kitchen amiodarone (PACERONE) 200 MG tablet Take 200 mg by mouth 2 (two) times daily.  Marland Kitchen aspirin 81 MG chewable tablet Chew 81 mg by mouth daily.  Marland Kitchen atorvastatin (LIPITOR) 40 MG tablet Take 1 tablet (40 mg total) by mouth daily at 6 PM.  . carvedilol (COREG) 3.125 MG tablet Take 3.125 mg by mouth 2 (two) times daily with a meal.  . clopidogrel (PLAVIX) 75 MG tablet TAKE ONE TABLET BY MOUTH EVERY DAY (Patient taking differently: Take 75 mg by mouth daily. At [facility])  . colchicine 0.6 MG tablet Take 1 tablet (0.6 mg total) by mouth daily as needed (gout flare).  . cyclobenzaprine (FLEXERIL) 10 MG tablet Take 1 tablet (10 mg total) by mouth at bedtime. (Patient taking differently: Take 10 mg by mouth at bedtime as needed for muscle spasms. )  . diclofenac sodium (VOLTAREN) 1 % GEL Apply 2 g topically 4 (four) times daily as needed (pain).   . fluticasone (FLONASE) 50 MCG/ACT nasal spray Place 2 sprays into both nostrils daily. (Patient taking differently: Place 2 sprays into both nostrils daily as needed for allergies. )  . fluticasone-salmeterol (ADVAIR HFA) 115-21 MCG/ACT inhaler Inhale 2 puffs into the lungs 2 (two) times daily.  . Magnesium 400 MG TABS Take 1 tablet by mouth 2 (two) times daily.  . Multiple Vitamin (MULTIVITAMIN WITH MINERALS) TABS tablet Take 1 tablet by mouth  daily.  . nitroGLYCERIN (NITROSTAT) 0.4 MG SL tablet Place 1 tablet (0.4 mg total) under the tongue every 5 (five) minutes as needed for chest pain.  . polyethylene glycol (MIRALAX / GLYCOLAX) 17 g packet Take 17 g by mouth daily.  . potassium chloride SA (K-DUR) 20 MEQ tablet Take 1 tablet (20 mEq total) by mouth 2 (two) times daily.  . sennosides-docusate sodium (SENOKOT-S) 8.6-50 MG tablet Take 1 tablet by mouth daily.  . sodium chloride (OCEAN) 0.65 % SOLN nasal spray Place 1 spray into both nostrils as needed for congestion.  Marland Kitchen spironolactone (ALDACTONE) 25 MG tablet Take 0.5 tablets (12.5 mg total) by mouth daily.  . Tiotropium Bromide Monohydrate (SPIRIVA RESPIMAT) 2.5 MCG/ACT AERS Inhale 1 puff into the lungs daily.  Marland Kitchen torsemide (DEMADEX) 20 MG tablet Take 1 tablet (20 mg total) by mouth 2 (two) times daily.  . Nutritional Supplements (FEEDING SUPPLEMENT, NEPRO CARB STEADY,) LIQD Take 237 mLs by  mouth 2 (two) times daily between meals.    Allergies Allergies  Allergen Reactions  . Lisinopril Cough    Family History Family History  Problem Relation Age of Onset  . Hypertension Mother   . Heart failure Mother   . Hypertension Father   . CAD Father   . Heart attack Father    Social History  reports that he quit smoking about 3 years ago. His smoking use included cigarettes. He has a 10.89 pack-year smoking history. He has never used smokeless tobacco. He reports previous alcohol use. He reports that he does not use drugs.  Review Of Systems:   Constitutional: Negative for fever and chills.  HENT: Negative for congestion and rhinorrhea.  Eyes: Negative for redness and visual disturbance.  Respiratory: Positive for shortness of breath but negative wheezing.  Cardiovascular: Negative for chest pain and palpitations but positive for edema.  Gastrointestinal: Negative  for nausea , vomiting and abdominal pain and  Loose stools Genitourinary: Negative for dysuria and urgency.   Endocrine: Denies polyuria, polyphagia and heat intolerance Musculoskeletal: Negative for myalgias and arthralgias.  Skin: Negative for pallor and wound.  Neurological: Negative for dizziness and headaches   VITAL SIGNS: BP 114/79   Pulse 80   Temp 98.1 F (36.7 C) (Oral)   Resp 19   Ht 6\' 2"  (1.88 m)   Wt 89.5 kg   SpO2 96%   BMI 25.33 kg/m   HEMODYNAMICS:    VENTILATOR SETTINGS: FiO2 (%):  [40 %] 40 %  INTAKE / OUTPUT: I/O last 3 completed shifts: In: 912.6 [P.O.:600; I.V.:52.2; IV Piggyback:260.4] Out: 1730 [Urine:150; Other:1580]  PHYSICAL EXAMINATION: General: Chronically ill looking, in moderate respiratory distress HEENT: PERRLA, trachea midline, moderate JVD Neuro: Awake and follows basic commands, speech is normal, moves all extremities Cardiovascular: Pulse regular, S1-S2, no murmur regurg or gallop, +2 pulses bilaterally, bilateral lower extremity edema Lungs: Increased work of breathing, bilateral breath sounds without any wheezes or rhonchi, diminished in the bases Abdomen: Nondistended, normal bowel sounds in all 4 quadrants, palpation reveals no organomegaly Musculoskeletal: Positive range of motion, right knee Skin: Warm and dry  LABS:  BMET Recent Labs  Lab 07/07/19 1734 07/07/19 1948 07/21/2019 0424  NA 135 138 136  K 4.4 4.9 4.7  CL 101 101 102  CO2 25 27 25   BUN 24* 26* 34*  CREATININE 1.52* 1.83* 2.27*  GLUCOSE 141* 125* 132*    Electrolytes Recent Labs  Lab 07/07/19 1538 07/07/19 1734 07/07/19 1948 07/23/2019 0424  CALCIUM  --  8.8* 9.2 8.7*  MG 1.9  --  2.2 2.0  PHOS  --  3.5 3.1 3.3    CBC Recent Labs  Lab 07/05/19 0418 07/06/19 0523 07/07/19 0540  WBC 11.6* 8.5 8.0  HGB 10.3* 10.1* 9.9*  HCT 32.3* 32.6* 31.7*  PLT 414* 375 326    Coag's No results for input(s): APTT, INR in the last 168 hours.  Sepsis Markers Recent Labs  Lab 07/05/19 2331  LATICACIDVEN 1.5    ABG No results for input(s): PHART, PCO2ART,  PO2ART in the last 168 hours.  Liver Enzymes Recent Labs  Lab 07/01/19 1710  07/07/19 1734 07/07/19 1948 07/05/2019 0424  AST 29  --   --   --   --   ALT 55*  --   --   --   --   ALKPHOS 109  --   --   --   --   BILITOT 1.4*  --   --   --   --  ALBUMIN 2.7*   < > 2.2* 2.3* 2.2*   < > = values in this interval not displayed.    Cardiac Enzymes No results for input(s): TROPONINI, PROBNP in the last 168 hours.  Glucose Recent Labs  Lab 07/04/19 1422  GLUCAP 133*    Imaging No results found.  STUDIES:  Last 2-D echo 02/24/2019:  IMPRESSIONS    1. The left ventricle has a visually estimated ejection fraction of of 10%. The cavity size was severely dilated. There is severely increased left ventricular wall thickness. Left ventricular diastolic Doppler parameters are consistent with restrictive  filling Left ventricular diffuse hypokinesis.  2. The right ventricle has normal systolic function. The cavity was normal. There is no increase in right ventricular wall thickness.  3. Left atrial size was moderately dilated.  4. The mitral valve is normal in structure. Mitral valve regurgitation is moderate to severe by color flow Doppler.  5. The tricuspid valve is normal in structure.  6. The aortic valve is normal in structure. Aortic valve regurgitation is trivial by color flow Doppler.  7. The interatrial septum was not well visualized.  CULTURES: Blood cultures x 2  ANTIBIOTICS: Anti-infectives (From admission, onward)   Start     Dose/Rate Route Frequency Ordered Stop   07/06/19 1000  Ampicillin-Sulbactam (UNASYN) 3 g in sodium chloride 0.9 % 100 mL IVPB  Status:  Discontinued     3 g 200 mL/hr over 30 Minutes Intravenous Every 8 hours 07/06/19 0904 07/06/19 0930   07/05/19 1500  Ampicillin-Sulbactam (UNASYN) 3 g in sodium chloride 0.9 % 100 mL IVPB  Status:  Discontinued     3 g 200 mL/hr over 30 Minutes Intravenous Every 6 hours 07/05/19 1438 07/06/19 0904        SIGNIFICANT EVENTS: 07/01/2019: Admitted 07/04/2019: Started on hemodialysis 07/05/2019: Started on CVVHD and transfer to [place] initiated 07/07/19 CRRT discontinued 07/08 Off BiPAP. Comfortable on [place] O2. Transfer to [facility]  LINES/TUBES: Right femoral hemodialysis catheter  DISCUSSION: 53 year old African-American male presenting with cardiorenal syndrome, left knee effusion status post aspiration and worsening renal failure requiring emergent hemodialysis.  ASSESSMENT Acute renal failure requiring hemodialysis Hyperkalemia, resolved Acute systolic heart failure with exacerbation Ischemic cardiomyopathy with reduced EF of 10 to 15% Recurrent left knee effusion status post aspiration - cultures negative Acute hypoxic respiratory failure requiring intermittent BiPAP Acute pulmonary edema   PLAN Hemodynamic monitoring per ICU protocol Hemodialysis per nephrology Cardiology following Post HD labs reviewed Nebulized bronchodilators  Continue all home medications per cardiology Awaiting transfer to [facility]: Code Status: Full code Diet: Heart healthy GI prophylaxis: Not indicated  VTE prophylaxis: SCDs and subcu heparin  FAMILY  - Updates: Patient updated on transfer status   Merton Border, MD PCCM service Mobile [phone] Pager [phone] 07/05/2019 10:19 AM

## 2019-07-10 LAB — CULTURE, BLOOD (ROUTINE X 2)
Culture: NO GROWTH
Culture: NO GROWTH
Special Requests: ADEQUATE
Special Requests: ADEQUATE

## 2019-07-16 MED ORDER — TIOTROPIUM BROMIDE MONOHYDRATE 18 MCG IN CAPS
18.00 | ORAL_CAPSULE | RESPIRATORY_TRACT | Status: DC
Start: 2019-07-17 — End: 2019-07-16

## 2019-07-16 MED ORDER — ALBUTEROL SULFATE HFA 108 (90 BASE) MCG/ACT IN AERS
2.00 | INHALATION_SPRAY | RESPIRATORY_TRACT | Status: DC
Start: ? — End: 2019-07-16

## 2019-07-16 MED ORDER — LIDOCAINE HCL 1 % IJ SOLN
3.00 | INTRAMUSCULAR | Status: DC
Start: ? — End: 2019-07-16

## 2019-07-16 MED ORDER — CYCLOBENZAPRINE HCL 10 MG PO TABS
5.00 | ORAL_TABLET | ORAL | Status: DC
Start: ? — End: 2019-07-16

## 2019-07-16 MED ORDER — ALLOPURINOL 100 MG PO TABS
200.00 | ORAL_TABLET | ORAL | Status: DC
Start: 2019-07-17 — End: 2019-07-16

## 2019-07-16 MED ORDER — GENERIC EXTERNAL MEDICATION
1.50 | Status: DC
Start: 2019-07-16 — End: 2019-07-16

## 2019-07-16 MED ORDER — BISACODYL 10 MG RE SUPP
10.00 | RECTAL | Status: DC
Start: ? — End: 2019-07-16

## 2019-07-16 MED ORDER — DOPAMINE IN D5W 3.2-5 MG/ML-% IV SOLN
5.00 | INTRAVENOUS | Status: DC
Start: ? — End: 2019-07-16

## 2019-07-16 MED ORDER — LIDOCAINE 5 % EX PTCH
2.00 | MEDICATED_PATCH | CUTANEOUS | Status: DC
Start: 2019-07-16 — End: 2019-07-16

## 2019-07-16 MED ORDER — POLYETHYLENE GLYCOL 3350 17 G PO PACK
17.00 | PACK | ORAL | Status: DC
Start: 2019-07-17 — End: 2019-07-16

## 2019-07-16 MED ORDER — GENERIC EXTERNAL MEDICATION
Status: DC
Start: ? — End: 2019-07-16

## 2019-07-16 MED ORDER — GENERIC EXTERNAL MEDICATION
100.00 | Status: DC
Start: 2019-07-16 — End: 2019-07-16

## 2019-07-16 MED ORDER — PANTOPRAZOLE SODIUM 40 MG PO TBEC
40.00 | DELAYED_RELEASE_TABLET | ORAL | Status: DC
Start: 2019-07-16 — End: 2019-07-16

## 2019-07-16 MED ORDER — MENTHOL 7.6 MG MT LOZG
1.00 | LOZENGE | OROMUCOSAL | Status: DC
Start: ? — End: 2019-07-16

## 2019-07-16 MED ORDER — HEPARIN (PORCINE) IN NACL 25000-0.45 UT/250ML-% IV SOLN
1800.00 | INTRAVENOUS | Status: DC
Start: ? — End: 2019-07-16

## 2019-07-16 MED ORDER — AMIODARONE HCL 200 MG PO TABS
400.00 | ORAL_TABLET | ORAL | Status: DC
Start: 2019-07-17 — End: 2019-07-16

## 2019-07-16 MED ORDER — SODIUM CHLORIDE 0.9 % IV SOLN
INTRAVENOUS | Status: DC
Start: ? — End: 2019-07-16

## 2019-07-16 MED ORDER — LIDOCAINE HCL 1 % IJ SOLN
0.50 | INTRAMUSCULAR | Status: DC
Start: ? — End: 2019-07-16

## 2019-07-16 MED ORDER — COLCHICINE 0.6 MG PO TABS
0.60 | ORAL_TABLET | ORAL | Status: DC
Start: 2019-07-17 — End: 2019-07-16

## 2019-07-16 MED ORDER — BENZONATATE 100 MG PO CAPS
100.00 | ORAL_CAPSULE | ORAL | Status: DC
Start: ? — End: 2019-07-16

## 2019-07-16 MED ORDER — SENNOSIDES-DOCUSATE SODIUM 8.6-50 MG PO TABS
2.00 | ORAL_TABLET | ORAL | Status: DC
Start: 2019-07-16 — End: 2019-07-16

## 2019-07-16 MED ORDER — GENERIC EXTERNAL MEDICATION
3.38 | Status: DC
Start: 2019-07-16 — End: 2019-07-16

## 2019-07-16 MED ORDER — DOBUTAMINE IN D5W 4-5 MG/ML-% IV SOLN
5.00 | INTRAVENOUS | Status: DC
Start: ? — End: 2019-07-16

## 2019-07-16 MED ORDER — OXYCODONE HCL 5 MG PO TABS
5.00 | ORAL_TABLET | ORAL | Status: DC
Start: ? — End: 2019-07-16

## 2019-07-16 MED ORDER — MELATONIN 3 MG PO TABS
6.00 | ORAL_TABLET | ORAL | Status: DC
Start: 2019-07-16 — End: 2019-07-16

## 2019-07-16 MED ORDER — ACETAMINOPHEN 325 MG PO TABS
650.00 | ORAL_TABLET | ORAL | Status: DC
Start: ? — End: 2019-07-16

## 2019-07-16 MED ORDER — MAGNESIUM OXIDE 400 MG PO TABS
400.00 | ORAL_TABLET | ORAL | Status: DC
Start: 2019-07-17 — End: 2019-07-16

## 2019-08-01 NOTE — Death Summary Note (Deleted)
PULMONARY / CRITICAL CARE MEDICINE  Name: Stephen [name] MRN: [id number] DOB: 1966/06/09    LOS: 6  Admitting diagnosis:  Discharge Diagnosis : Acute renal failure requiring emergent dialysis  HPI:  53 y/o male admitted with acute systolic CHF exacerbation, acute hypoxic respiratory failure, left knee effusion and CKD stage III; now in acute renal failure with hyperkalemia and metabolic acidosis respiratory failure. He underwent emergent HD and was transitioned to CVVHD. He remains ob BiPAP with an FiO2 of 40%. See  admission H & P for rest of the HPI.   Hospital Course: This is a 53 year old African-American male with a medical history as indicated below, severe nonischemic cardiomyopathy with an ejection fraction of 10 to 15% and an AICD, who was admitted on 07/01/2019 with increased lower extremity edema, dyspnea and left knee effusion.  He underwent a right knee aspiration in the ED.  His initial blood pressure was low but improved while in the ED.  His ED work-up was significant for proBNP of 1793 and diffuse interstitial edema on chest x-ray.  He was admitted with acute systolic heart failure with exacerbation and started on IV diuresis with lasix infusion.  He was seen by cardiology and nephrology for worsening renal failure.  Due to decrease in his urine output, the decision was made to start hemodialysis hence he was transferred to the ICU for further management. Underwent emergent hemodialysis on 07/04/2019 pm and was transitioned to CVVHD on 07/05/2019.  He was started on Unasyn on 07/05/2019 for possible aspiration pneumonitis.  His blood pressure are marginal but his MAPs are 65 and above. His mental status has improved but he remains hypoxic requiring continuous BiPAP and desaturates when off BiPAP.  Per Dr Ubaldo Glassing, plan was to discuss with the [facility] advanced heart failure team as patient had an upcoming appointment in 2 weeks. However, patient expressed the desire to be  transferred to [facility] as he doesn't feel that he will be out of the hospital long enough to attend the appointment. The [facility] transfer center was contacted and patient was accepted for transfer. Accepting MD is Dr. Lillia Corporal at the cardiac ICU. He is awaiting a bed assignment.   Past Medical History:  Diagnosis Date  . AICD (automatic cardioverter/defibrillator) present   . AKI (acute kidney injury) ([place]) 12/24/2017  . Arrhythmia   . Arthritis    "lower back, knees" (06/10/2018)  . Cardiac arrest ([place]) 12/23/2017   Brief V-fib arrest  . Chest pain 09/08/2017  . CHF (congestive heart failure) ([facility])    nonischemic cardiomyopathy, EF 25%  . Gout    "on daily RX" (06/10/2018)  . Headache    "q couple months" (06/10/2018)  . High cholesterol   . History of kidney stones   . Hypertension   . Influenza A 12/24/2017  . NICM (nonischemic cardiomyopathy) ([place])   . Pneumonia 12/20/2017  . TIA (transient ischemic attack) 06/21/2016   "still affects my memory a little bit" (06/10/2018)   Past Surgical History:  Procedure Laterality Date  . EXTERNAL FIXATION LEG Right ~ 2000   "was going bowlegged; had to brake my leg to fix it"  . HERNIA REPAIR    . ICD IMPLANT N/A 12/30/2017   Procedure: ICD IMPLANT;  Surgeon: Deboraha Sprang, MD;  Location: [place] [facility];  Service: Cardiovascular;  Laterality: N/A;  . LEFT HEART CATH AND CORONARY ANGIOGRAPHY N/A 09/09/2017   Procedure: LEFT HEART CATH AND CORONARY ANGIOGRAPHY;  Surgeon: Teodoro Spray,  MD;  Location: [place] [facility];  Service: Cardiovascular;  Laterality: N/A;  . TEMPORARY DIALYSIS CATHETER N/A 07/04/2019   Procedure: TEMPORARY DIALYSIS CATHETER;  Surgeon: Serafina Mitchell, MD;  Location: [place] [facility];  Service: Cardiovascular;  Laterality: N/A;  . UMBILICAL HERNIA REPAIR  1990s  . V TACH ABLATION  06/10/2018  . V TACH ABLATION N/A 06/10/2018   Procedure: V TACH ABLATION;  Surgeon: Thompson Grayer, MD;  Location: [place] [facility];   Service: Cardiovascular;  Laterality: N/A;  . VASECTOMY     No current facility-administered medications on file prior to encounter.    Current Outpatient Medications on File Prior to Encounter  Medication Sig  . acetaminophen (TYLENOL) 325 MG tablet Take 2 tablets (650 mg total) by mouth every 6 (six) hours as needed. (Patient taking differently: Take 325-650 mg by mouth. )  . albuterol (PROVENTIL HFA;VENTOLIN HFA) 108 (90 Base) MCG/ACT inhaler Inhale 2 puffs into the lungs every 4 (four) hours as needed for wheezing or shortness of breath.  . allopurinol (ZYLOPRIM) 100 MG tablet Take 2 tablets (200 mg total) by mouth daily.  Stephen [name] amiodarone (PACERONE) 200 MG tablet Take 200 mg by mouth 2 (two) times daily.  Stephen [name] aspirin 81 MG chewable tablet Chew 81 mg by mouth daily.  Stephen [name] atorvastatin (LIPITOR) 40 MG tablet Take 1 tablet (40 mg total) by mouth daily at 6 PM.  . carvedilol (COREG) 3.125 MG tablet Take 3.125 mg by mouth 2 (two) times daily with a meal.  . clopidogrel (PLAVIX) 75 MG tablet TAKE ONE TABLET BY MOUTH EVERY DAY (Patient taking differently: Take 75 mg by mouth daily. At [facility])  . colchicine 0.6 MG tablet Take 1 tablet (0.6 mg total) by mouth daily as needed (gout flare).  . cyclobenzaprine (FLEXERIL) 10 MG tablet Take 1 tablet (10 mg total) by mouth at bedtime. (Patient taking differently: Take 10 mg by mouth at bedtime as needed for muscle spasms. )  . diclofenac sodium (VOLTAREN) 1 % GEL Apply 2 g topically 4 (four) times daily as needed (pain).   . fluticasone (FLONASE) 50 MCG/ACT nasal spray Place 2 sprays into both nostrils daily. (Patient taking differently: Place 2 sprays into both nostrils daily as needed for allergies. )  . fluticasone-salmeterol (ADVAIR HFA) 115-21 MCG/ACT inhaler Inhale 2 puffs into the lungs 2 (two) times daily.  . Magnesium 400 MG TABS Take 1 tablet by mouth 2 (two) times daily.  . Multiple Vitamin (MULTIVITAMIN WITH MINERALS) TABS tablet Take 1 tablet by mouth  daily.  . nitroGLYCERIN (NITROSTAT) 0.4 MG SL tablet Place 1 tablet (0.4 mg total) under the tongue every 5 (five) minutes as needed for chest pain.  . polyethylene glycol (MIRALAX / GLYCOLAX) 17 g packet Take 17 g by mouth daily.  . potassium chloride SA (K-DUR) 20 MEQ tablet Take 1 tablet (20 mEq total) by mouth 2 (two) times daily.  . sennosides-docusate sodium (SENOKOT-S) 8.6-50 MG tablet Take 1 tablet by mouth daily.  . sodium chloride (OCEAN) 0.65 % SOLN nasal spray Place 1 spray into both nostrils as needed for congestion.  Stephen [name] spironolactone (ALDACTONE) 25 MG tablet Take 0.5 tablets (12.5 mg total) by mouth daily.  . Tiotropium Bromide Monohydrate (SPIRIVA RESPIMAT) 2.5 MCG/ACT AERS Inhale 1 puff into the lungs daily.  Stephen [name] torsemide (DEMADEX) 20 MG tablet Take 1 tablet (20 mg total) by mouth 2 (two) times daily.  . Nutritional Supplements (FEEDING SUPPLEMENT, NEPRO CARB STEADY,) LIQD Take 237 mLs by  mouth 2 (two) times daily between meals.    Allergies Allergies  Allergen Reactions  . Lisinopril Cough    Family History Family History  Problem Relation Age of Onset  . Hypertension Mother   . Heart failure Mother   . Hypertension Father   . CAD Father   . Heart attack Father    Social History  reports that he quit smoking about 3 years ago. His smoking use included cigarettes. He has a 10.89 pack-year smoking history. He has never used smokeless tobacco. He reports previous alcohol use. He reports that he does not use drugs.  Review Of Systems:   Constitutional: Negative for fever and chills.  HENT: Negative for congestion and rhinorrhea.  Eyes: Negative for redness and visual disturbance.  Respiratory: Positive for shortness of breath but negative wheezing.  Cardiovascular: Negative for chest pain and palpitations but positive for edema.  Gastrointestinal: Negative  for nausea , vomiting and abdominal pain and  Loose stools Genitourinary: Negative for dysuria and urgency.   Endocrine: Denies polyuria, polyphagia and heat intolerance Musculoskeletal: Negative for myalgias and arthralgias.  Skin: Negative for pallor and wound.  Neurological: Negative for dizziness and headaches   VITAL SIGNS: BP 98/82   Pulse 80   Temp 98 F (36.7 C) (Oral)   Resp (!) 29   Ht 6\' 2"  (1.88 m)   Wt 89.5 kg   SpO2 95%   BMI 25.33 kg/m   HEMODYNAMICS:    VENTILATOR SETTINGS: FiO2 (%):  [40 %] 40 %  INTAKE / OUTPUT: I/O last 3 completed shifts: In: 912.6 [P.O.:600; I.V.:52.2; IV Piggyback:260.4] Out: 1730 [Urine:150; Other:1580]  PHYSICAL EXAMINATION: General: Chronically ill looking, in moderate respiratory distress HEENT: PERRLA, trachea midline, moderate JVD Neuro: Awake and follows basic commands, speech is normal, moves all extremities Cardiovascular: Pulse regular, S1-S2, no murmur regurg or gallop, +2 pulses bilaterally, bilateral lower extremity edema Lungs: Increased work of breathing, bilateral breath sounds without any wheezes or rhonchi, diminished in the bases Abdomen: Nondistended, normal bowel sounds in all 4 quadrants, palpation reveals no organomegaly Musculoskeletal: Positive range of motion, right knee Skin: Warm and dry  LABS:  BMET Recent Labs  Lab 07/07/19 1734 07/07/19 1948 07/07/2019 0424  NA 135 138 136  K 4.4 4.9 4.7  CL 101 101 102  CO2 25 27 25   BUN 24* 26* 34*  CREATININE 1.52* 1.83* 2.27*  GLUCOSE 141* 125* 132*    Electrolytes Recent Labs  Lab 07/07/19 1538 07/07/19 1734 07/07/19 1948 07/16/2019 0424  CALCIUM  --  8.8* 9.2 8.7*  MG 1.9  --  2.2 2.0  PHOS  --  3.5 3.1 3.3    CBC Recent Labs  Lab 07/05/19 0418 07/06/19 0523 07/07/19 0540  WBC 11.6* 8.5 8.0  HGB 10.3* 10.1* 9.9*  HCT 32.3* 32.6* 31.7*  PLT 414* 375 326    Coag's No results for input(s): APTT, INR in the last 168 hours.  Sepsis Markers Recent Labs  Lab 07/05/19 2331  LATICACIDVEN 1.5    ABG No results for input(s): PHART, PCO2ART,  PO2ART in the last 168 hours.  Liver Enzymes Recent Labs  Lab 07/01/19 1710  07/07/19 1734 07/07/19 1948 07/22/2019 0424  AST 29  --   --   --   --   ALT 55*  --   --   --   --   ALKPHOS 109  --   --   --   --   BILITOT 1.4*  --   --   --   --  ALBUMIN 2.7*   < > 2.2* 2.3* 2.2*   < > = values in this interval not displayed.    Cardiac Enzymes No results for input(s): TROPONINI, PROBNP in the last 168 hours.  Glucose Recent Labs  Lab 07/04/19 1422  GLUCAP 133*    Imaging No results found.  STUDIES:  Last 2-D echo 02/24/2019:  IMPRESSIONS    1. The left ventricle has a visually estimated ejection fraction of of 10%. The cavity size was severely dilated. There is severely increased left ventricular wall thickness. Left ventricular diastolic Doppler parameters are consistent with restrictive  filling Left ventricular diffuse hypokinesis.  2. The right ventricle has normal systolic function. The cavity was normal. There is no increase in right ventricular wall thickness.  3. Left atrial size was moderately dilated.  4. The mitral valve is normal in structure. Mitral valve regurgitation is moderate to severe by color flow Doppler.  5. The tricuspid valve is normal in structure.  6. The aortic valve is normal in structure. Aortic valve regurgitation is trivial by color flow Doppler.  7. The interatrial septum was not well visualized.  CULTURES: Blood cultures x 2  ANTIBIOTICS: Anti-infectives (From admission, onward)   Start     Dose/Rate Route Frequency Ordered Stop   07/06/19 1000  Ampicillin-Sulbactam (UNASYN) 3 g in sodium chloride 0.9 % 100 mL IVPB  Status:  Discontinued     3 g 200 mL/hr over 30 Minutes Intravenous Every 8 hours 07/06/19 0904 07/06/19 0930   07/05/19 1500  Ampicillin-Sulbactam (UNASYN) 3 g in sodium chloride 0.9 % 100 mL IVPB  Status:  Discontinued     3 g 200 mL/hr over 30 Minutes Intravenous Every 6 hours 07/05/19 1438 07/06/19 0904        SIGNIFICANT EVENTS: 07/01/2019: Admitted 07/04/2019: Started on hemodialysis 07/05/2019: Started on CVVHD and transfer to [place] initiated 07/07/19 CRRT discontinued 07/08 Off BiPAP. Comfortable on [place] O2. Transfer to [facility]  LINES/TUBES: Right femoral hemodialysis catheter  DISCUSSION: 53 year old African-American male presenting with cardiorenal syndrome, left knee effusion status post aspiration and worsening renal failure requiring emergent hemodialysis.  ASSESSMENT Acute renal failure requiring hemodialysis Hyperkalemia, resolved Acute systolic heart failure with exacerbation Ischemic cardiomyopathy with reduced EF of 10 to 15% Recurrent left knee effusion status post aspiration - cultures negative Acute hypoxic respiratory failure requiring intermittent BiPAP Acute pulmonary edema   PLAN Hemodynamic monitoring per ICU protocol Hemodialysis per nephrology Cardiology following Post HD labs reviewed Nebulized bronchodilators  Continue all home medications per cardiology Awaiting transfer to [facility]: Code Status: Full code Diet: Heart healthy GI prophylaxis: Not indicated  VTE prophylaxis: SCDs and subcu heparin  FAMILY  - Updates: Patient updated on transfer status   Merton Border, MD PCCM service Mobile [phone] Pager [phone] 07/07/2019 9:09 AM

## 2019-08-01 DEATH — deceased

## 2019-08-27 MED ORDER — SUCRETS 2.4 MG MT LOZG
6.00 | LOZENGE | OROMUCOSAL | Status: DC
Start: 2019-09-15 — End: 2019-08-27

## 2019-08-27 MED ORDER — OATMEAL BATH OILATED EX PACK
200.00 | PACK | CUTANEOUS | Status: DC
Start: ? — End: 2019-08-27

## 2019-08-27 MED ORDER — MYLANTA ULTRA 700-300 MG PO CHEW
3.00 | CHEWABLE_TABLET | ORAL | Status: DC
Start: ? — End: 2019-08-27

## 2019-08-27 MED ORDER — OPURITY PO
2.00 | ORAL | Status: DC
Start: 2019-09-15 — End: 2019-08-27

## 2019-08-27 MED ORDER — Medication
20.00 | Status: DC
Start: 2019-09-15 — End: 2019-08-27

## 2019-08-27 MED ORDER — PHENOL EX
80.00 | CUTANEOUS | Status: DC
Start: 2019-08-28 — End: 2019-08-27

## 2019-08-27 MED ORDER — STRI-DEX MAXIMUM STRENGTH 2 % EX PADS
50.00 | MEDICATED_PAD | CUTANEOUS | Status: DC
Start: ? — End: 2019-08-27

## 2019-08-27 MED ORDER — BROMHIST-NR PO
0.00 | ORAL | Status: DC
Start: 2019-09-15 — End: 2019-08-27

## 2019-08-27 MED ORDER — Medication
5000.00 | Status: DC
Start: 2019-09-16 — End: 2019-08-27

## 2019-08-27 MED ORDER — ROGAINE EXTRA STRENGTH FOR MEN 5 % EX SOLN
40.00 | CUTANEOUS | Status: DC
Start: 2019-08-28 — End: 2019-08-27

## 2019-08-27 MED ORDER — PRIMAQUINE PHOSPHATE POWD
1.00 | Status: DC
Start: ? — End: 2019-08-27

## 2019-08-27 MED ORDER — Medication
Status: DC
Start: ? — End: 2019-08-27

## 2019-08-27 MED ORDER — Medication
5000.00 | Status: DC
Start: 2019-08-28 — End: 2019-08-27

## 2019-08-27 MED ORDER — NEXTERONE IV
81.00 | INTRAVENOUS | Status: DC
Start: 2019-09-16 — End: 2019-08-27

## 2019-08-27 MED ORDER — AMIODARONE HCL IV
200.00 | INTRAVENOUS | Status: DC
Start: 2019-09-16 — End: 2019-08-27

## 2019-08-27 MED ORDER — Medication
100.00 | Status: DC
Start: 2019-09-16 — End: 2019-08-27

## 2019-08-27 MED ORDER — PLACEBO #00 PO
1.00 | ORAL | Status: DC
Start: 2019-09-15 — End: 2019-08-27

## 2019-08-27 MED ORDER — METHEN-BELLA-METH BL-PHEN SAL PO TABS
100.00 | ORAL_TABLET | ORAL | Status: DC
Start: ? — End: 2019-08-27

## 2019-08-27 MED ORDER — EQUATE NICOTINE 4 MG MT GUM
4.00 | CHEWING_GUM | OROMUCOSAL | Status: DC
Start: ? — End: 2019-08-27

## 2019-08-27 MED ORDER — ASPIRIN-CAFFEINE PO
5.00 | ORAL | Status: DC
Start: ? — End: 2019-08-27

## 2019-08-27 MED ORDER — TRYPTOPHAN 500 MG PO CAPS
300.00 | ORAL_CAPSULE | ORAL | Status: DC
Start: 2019-09-15 — End: 2019-08-27

## 2019-08-27 MED ORDER — BENICAR 20 MG PO TABS
12.50 | ORAL_TABLET | ORAL | Status: DC
Start: ? — End: 2019-08-27

## 2019-08-27 MED ORDER — MORPHINE SULFATE ER 15 MG PO TBCR
1500.00 | EXTENDED_RELEASE_TABLET | ORAL | Status: DC
Start: ? — End: 2019-08-27

## 2019-08-27 MED ORDER — COLACE MICROENEMA RE
2.50 | RECTAL | Status: DC
Start: 2019-08-28 — End: 2019-08-27

## 2019-08-27 MED ORDER — LIDOCAINE HCL 200 MG/10ML IJ SOSY
2.50 | PREFILLED_SYRINGE | INTRAMUSCULAR | Status: DC
Start: ? — End: 2019-08-27

## 2019-08-27 MED ORDER — SELECT BRAND INSULIN SYRINGE 29G X 1/2" 1 ML MISC
2.00 | Status: DC
Start: 2019-09-16 — End: 2019-08-27

## 2019-08-27 MED ORDER — VICON FORTE PO CAPS
17.00 | ORAL_CAPSULE | ORAL | Status: DC
Start: 2019-09-15 — End: 2019-08-27

## 2019-08-27 MED ORDER — Medication
1000.00 | Status: DC
Start: 2019-08-27 — End: 2019-08-27

## 2019-08-27 MED ORDER — Medication
975.00 | Status: DC
Start: 2019-09-15 — End: 2019-08-27

## 2019-08-27 MED ORDER — TUSSI PRES-B 2-15-200 MG/5ML PO LIQD
0.50 | ORAL | Status: DC
Start: ? — End: 2019-08-27

## 2019-08-27 MED ORDER — NIFUROXIME POWD
10.00 | Status: DC
Start: 2019-09-15 — End: 2019-08-27

## 2019-09-15 MED ORDER — BISACODYL 10 MG RE SUPP
10.00 | RECTAL | Status: DC
Start: ? — End: 2019-09-15

## 2019-09-15 MED ORDER — HEPARIN SODIUM (PORCINE) 1000 UNIT/ML IJ SOLN
INTRAMUSCULAR | Status: DC
Start: ? — End: 2019-09-15

## 2019-09-15 MED ORDER — PROMETHAZINE HCL 6.25 MG/5ML PO SYRP
6.25 | ORAL_SOLUTION | ORAL | Status: DC
Start: ? — End: 2019-09-15

## 2019-09-15 MED ORDER — DEXTROSE 5 % IV SOLN
INTRAVENOUS | Status: DC
Start: 2019-09-15 — End: 2019-09-15

## 2019-09-15 MED ORDER — WARFARIN SODIUM 2 MG PO TABS
2.00 | ORAL_TABLET | ORAL | Status: DC
Start: 2019-09-15 — End: 2019-09-15

## 2019-09-15 MED ORDER — SODIUM BICARBONATE 650 MG PO TABS
650.00 | ORAL_TABLET | ORAL | Status: DC
Start: 2019-09-15 — End: 2019-09-15

## 2019-10-15 MED ORDER — DEXTROSE 50 % IV SOLN
25.00 | INTRAVENOUS | Status: DC
Start: ? — End: 2019-10-15

## 2019-10-15 MED ORDER — POLYVINYL ALCOHOL-POVIDONE PF 1.4-0.6 % OP SOLN
2.00 | OPHTHALMIC | Status: DC
Start: 2019-10-15 — End: 2019-10-15

## 2019-10-15 MED ORDER — POLYETHYLENE GLYCOL 3350 17 G PO PACK
1.00 | PACK | ORAL | Status: DC
Start: ? — End: 2019-10-15

## 2019-10-15 MED ORDER — CETIRIZINE HCL 10 MG PO TABS
10.00 | ORAL_TABLET | ORAL | Status: DC
Start: ? — End: 2019-10-15

## 2019-10-15 MED ORDER — LIDOCAINE 4 % EX PTCH
1.00 | MEDICATED_PATCH | CUTANEOUS | Status: DC
Start: ? — End: 2019-10-15

## 2019-10-15 MED ORDER — ALLOPURINOL 100 MG PO TABS
300.00 | ORAL_TABLET | ORAL | Status: DC
Start: 2019-10-16 — End: 2019-10-15

## 2019-10-15 MED ORDER — LOSARTAN POTASSIUM 25 MG PO TABS
25.00 | ORAL_TABLET | ORAL | Status: DC
Start: 2019-10-16 — End: 2019-10-15

## 2019-10-15 MED ORDER — AMIODARONE HCL 200 MG PO TABS
200.00 | ORAL_TABLET | ORAL | Status: DC
Start: 2019-10-16 — End: 2019-10-15

## 2019-10-15 MED ORDER — CARDENE SR 45 MG PO CP12
7.50 | ORAL_CAPSULE | ORAL | Status: DC
Start: 2019-10-15 — End: 2019-10-15

## 2019-10-15 MED ORDER — COLCHICINE 0.6 MG PO TABS
0.60 | ORAL_TABLET | ORAL | Status: DC
Start: 2019-10-16 — End: 2019-10-15

## 2019-10-15 MED ORDER — SENNOSIDES-DOCUSATE SODIUM 8.6-50 MG PO TABS
2.00 | ORAL_TABLET | ORAL | Status: DC
Start: 2019-10-15 — End: 2019-10-15

## 2019-10-15 MED ORDER — ACETAMINOPHEN 325 MG PO TABS
650.00 | ORAL_TABLET | ORAL | Status: DC
Start: ? — End: 2019-10-15

## 2019-10-15 MED ORDER — ASPIRIN 81 MG PO CHEW
81.00 | CHEWABLE_TABLET | ORAL | Status: DC
Start: 2019-10-16 — End: 2019-10-15

## 2019-10-15 MED ORDER — TORSEMIDE 20 MG PO TABS
20.00 | ORAL_TABLET | ORAL | Status: DC
Start: 2019-10-15 — End: 2019-10-15

## 2019-10-15 MED ORDER — FLUTICASONE PROPIONATE 50 MCG/ACT NA SUSP
2.00 | NASAL | Status: DC
Start: ? — End: 2019-10-15

## 2019-10-15 MED ORDER — GENERIC EXTERNAL MEDICATION
Status: DC
Start: ? — End: 2019-10-15

## 2019-10-15 MED ORDER — MELATONIN 3 MG PO TABS
6.00 | ORAL_TABLET | ORAL | Status: DC
Start: 2019-10-15 — End: 2019-10-15

## 2019-10-15 MED ORDER — METOCLOPRAMIDE HCL 5 MG PO TABS
5.00 | ORAL_TABLET | ORAL | Status: DC
Start: 2019-10-15 — End: 2019-10-15

## 2019-10-15 MED ORDER — OXYCODONE HCL 5 MG PO TABS
2.50 | ORAL_TABLET | ORAL | Status: DC
Start: ? — End: 2019-10-15

## 2019-12-01 MED ORDER — Medication
2.00 | Status: DC
Start: 2019-12-02 — End: 2019-12-01

## 2019-12-01 MED ORDER — Medication
20.00 | Status: DC
Start: 2019-12-02 — End: 2019-12-01

## 2019-12-01 MED ORDER — Medication
5.00 | Status: DC
Start: ? — End: 2019-12-01

## 2019-12-01 MED ORDER — METOPROLOL SUCCINATE ER 25 MG PO TB24
12.50 | ORAL_TABLET | ORAL | Status: DC
Start: 2019-12-01 — End: 2019-12-01

## 2019-12-01 MED ORDER — Medication
2.00 | Status: DC
Start: 2019-12-01 — End: 2019-12-01

## 2019-12-01 MED ORDER — Medication
Status: DC
Start: ? — End: 2019-12-01

## 2019-12-01 MED ORDER — NEXTERONE IV
81.00 | INTRAVENOUS | Status: DC
Start: 2019-12-02 — End: 2019-12-01

## 2019-12-01 MED ORDER — Medication
4.00 | Status: DC
Start: ? — End: 2019-12-01

## 2019-12-01 MED ORDER — QUINERVA 260 MG PO TABS
650.00 | ORAL_TABLET | ORAL | Status: DC
Start: 2019-12-01 — End: 2019-12-01

## 2019-12-01 MED ORDER — DICLOXACILLIN SODIUM 62.5 MG/5ML PO SUSR
10.00 | ORAL | Status: DC
Start: ? — End: 2019-12-01

## 2019-12-01 MED ORDER — HM VITAMIN C 1000 MG PO TABS
12.50 | ORAL_TABLET | ORAL | Status: DC
Start: 2019-12-02 — End: 2019-12-01

## 2019-12-01 MED ORDER — Medication
300.00 | Status: DC
Start: 2019-12-02 — End: 2019-12-01

## 2019-12-01 MED ORDER — Medication
7.50 | Status: DC
Start: 2019-12-01 — End: 2019-12-01

## 2019-12-01 MED ORDER — METHOCARBAMOL 500 MG PO TABS
1000.00 | ORAL_TABLET | ORAL | Status: DC
Start: ? — End: 2019-12-01

## 2019-12-01 MED ORDER — OLANZAPINE-FLUOXETINE HCL 6-50 MG PO CAPS
6.00 | ORAL_CAPSULE | ORAL | Status: DC
Start: 2019-12-01 — End: 2019-12-01

## 2019-12-01 MED ORDER — AMIODARONE HCL IV
200.00 | INTRAVENOUS | Status: DC
Start: 2019-12-02 — End: 2019-12-01

## 2019-12-01 MED ORDER — CALCIUM CITRATE PO
100.00 | ORAL | Status: DC
Start: 2019-12-02 — End: 2019-12-01

## 2019-12-01 MED ORDER — BAYER WOMENS 81-300 MG PO TABS
40.00 | ORAL_TABLET | ORAL | Status: DC
Start: 2019-12-02 — End: 2019-12-01

## 2019-12-01 MED ORDER — CORTIZONE-5 EX
0.60 | CUTANEOUS | Status: DC
Start: 2019-12-02 — End: 2019-12-01

## 2019-12-01 MED ORDER — SELECT BRAND INSULIN SYRINGE 29G X 1/2" 1 ML MISC
2.00 | Status: DC
Start: 2019-12-01 — End: 2019-12-01

## 2019-12-01 MED ORDER — EMPTY CAPSULE SIZE 0 PINK TRAN CAPS
18.00 | ORAL_CAPSULE | Status: DC
Start: 2019-12-02 — End: 2019-12-01

## 2019-12-07 ENCOUNTER — Telehealth: Payer: Self-pay

## 2019-12-07 NOTE — Telephone Encounter (Signed)
I had the pt send a manual transmission because Lennette Bihari, Np wants to know the pt trends. The pt is getting a L-bag. Transmission received. I told the pt that the nurse will review the transmission and give him and the Lennette Bihari a call back. Lennette Bihari, Np phone number is [phone].

## 2019-12-07 NOTE — Telephone Encounter (Signed)
Patient received LVAD at [facility]). Lennette Bihari Np contacted our clinic due to patient reporting that his device may have treated him. Manual transmission was sent by patient and transmission showed device function WNL. HR in 90's and SR on presenting. No episodes and no treatment received from device since ast interrogation at [facility] on 11/30/19.Lennette Bihari NP reports patient has voiced that he may transfer care of his ICD to [facility] because they care for his LVAD. [facility] will notify our clinic if care is to be transferred.

## 2019-12-08 ENCOUNTER — Ambulatory Visit (INDEPENDENT_AMBULATORY_CARE_PROVIDER_SITE_OTHER): Payer: PRIVATE HEALTH INSURANCE | Admitting: *Deleted

## 2019-12-08 DIAGNOSIS — I5022 Chronic systolic (congestive) heart failure: Secondary | ICD-10-CM | POA: Diagnosis not present

## 2019-12-08 LAB — CUP PACEART REMOTE DEVICE CHECK
Battery Remaining Longevity: 119 mo
Battery Voltage: 3 V
Brady Statistic RV Percent Paced: 19.5 %
Date Time Interrogation Session: 20201207170234
HighPow Impedance: 51 Ohm
Implantable Lead Implant Date: 20181231
Implantable Lead Location: 753860
Implantable Pulse Generator Implant Date: 20181231
Lead Channel Impedance Value: 304 Ohm
Lead Channel Impedance Value: 361 Ohm
Lead Channel Pacing Threshold Amplitude: 1.75 V
Lead Channel Pacing Threshold Pulse Width: 0.4 ms
Lead Channel Sensing Intrinsic Amplitude: 5 mV
Lead Channel Sensing Intrinsic Amplitude: 5 mV
Lead Channel Setting Pacing Amplitude: 2.5 V
Lead Channel Setting Pacing Pulse Width: 1 ms
Lead Channel Setting Sensing Sensitivity: 0.3 mV

## 2019-12-22 ENCOUNTER — Ambulatory Visit: Payer: PRIVATE HEALTH INSURANCE | Admitting: Family Medicine

## 2020-01-12 NOTE — Progress Notes (Signed)
ICD remote 

## 2020-02-13 IMAGING — CR DG CHEST 2V
1 series · 2 of 2 positions shown · non-contrast
Comparison: 08/19/2018

CLINICAL DATA: Productive cough and shortness of Breath

EXAM:
CHEST - 2 VIEW

[Series 1: dg chest 2 view · 0.14mm/px · 2 of 2 slices shown]
[im 1/2]
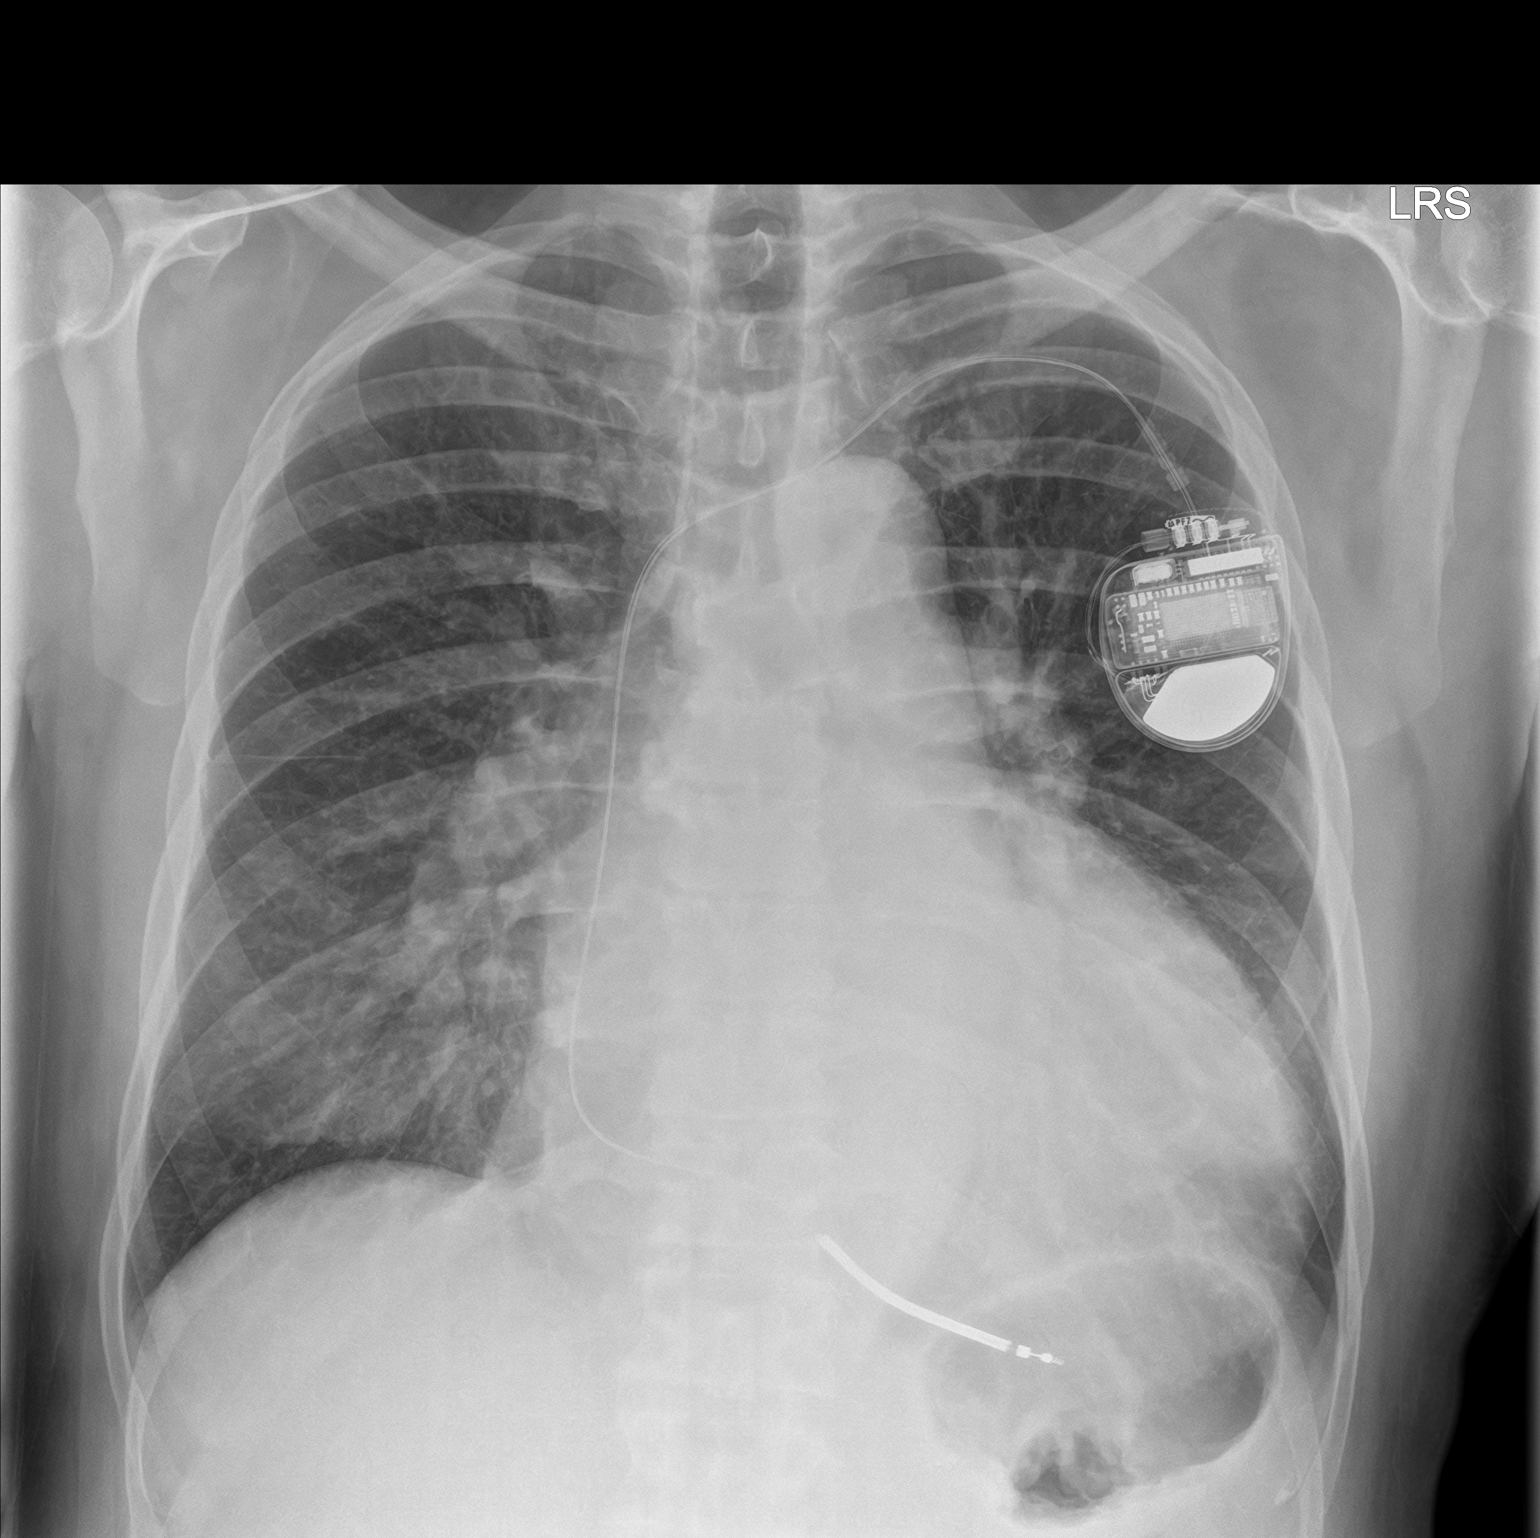
[im 2/2]
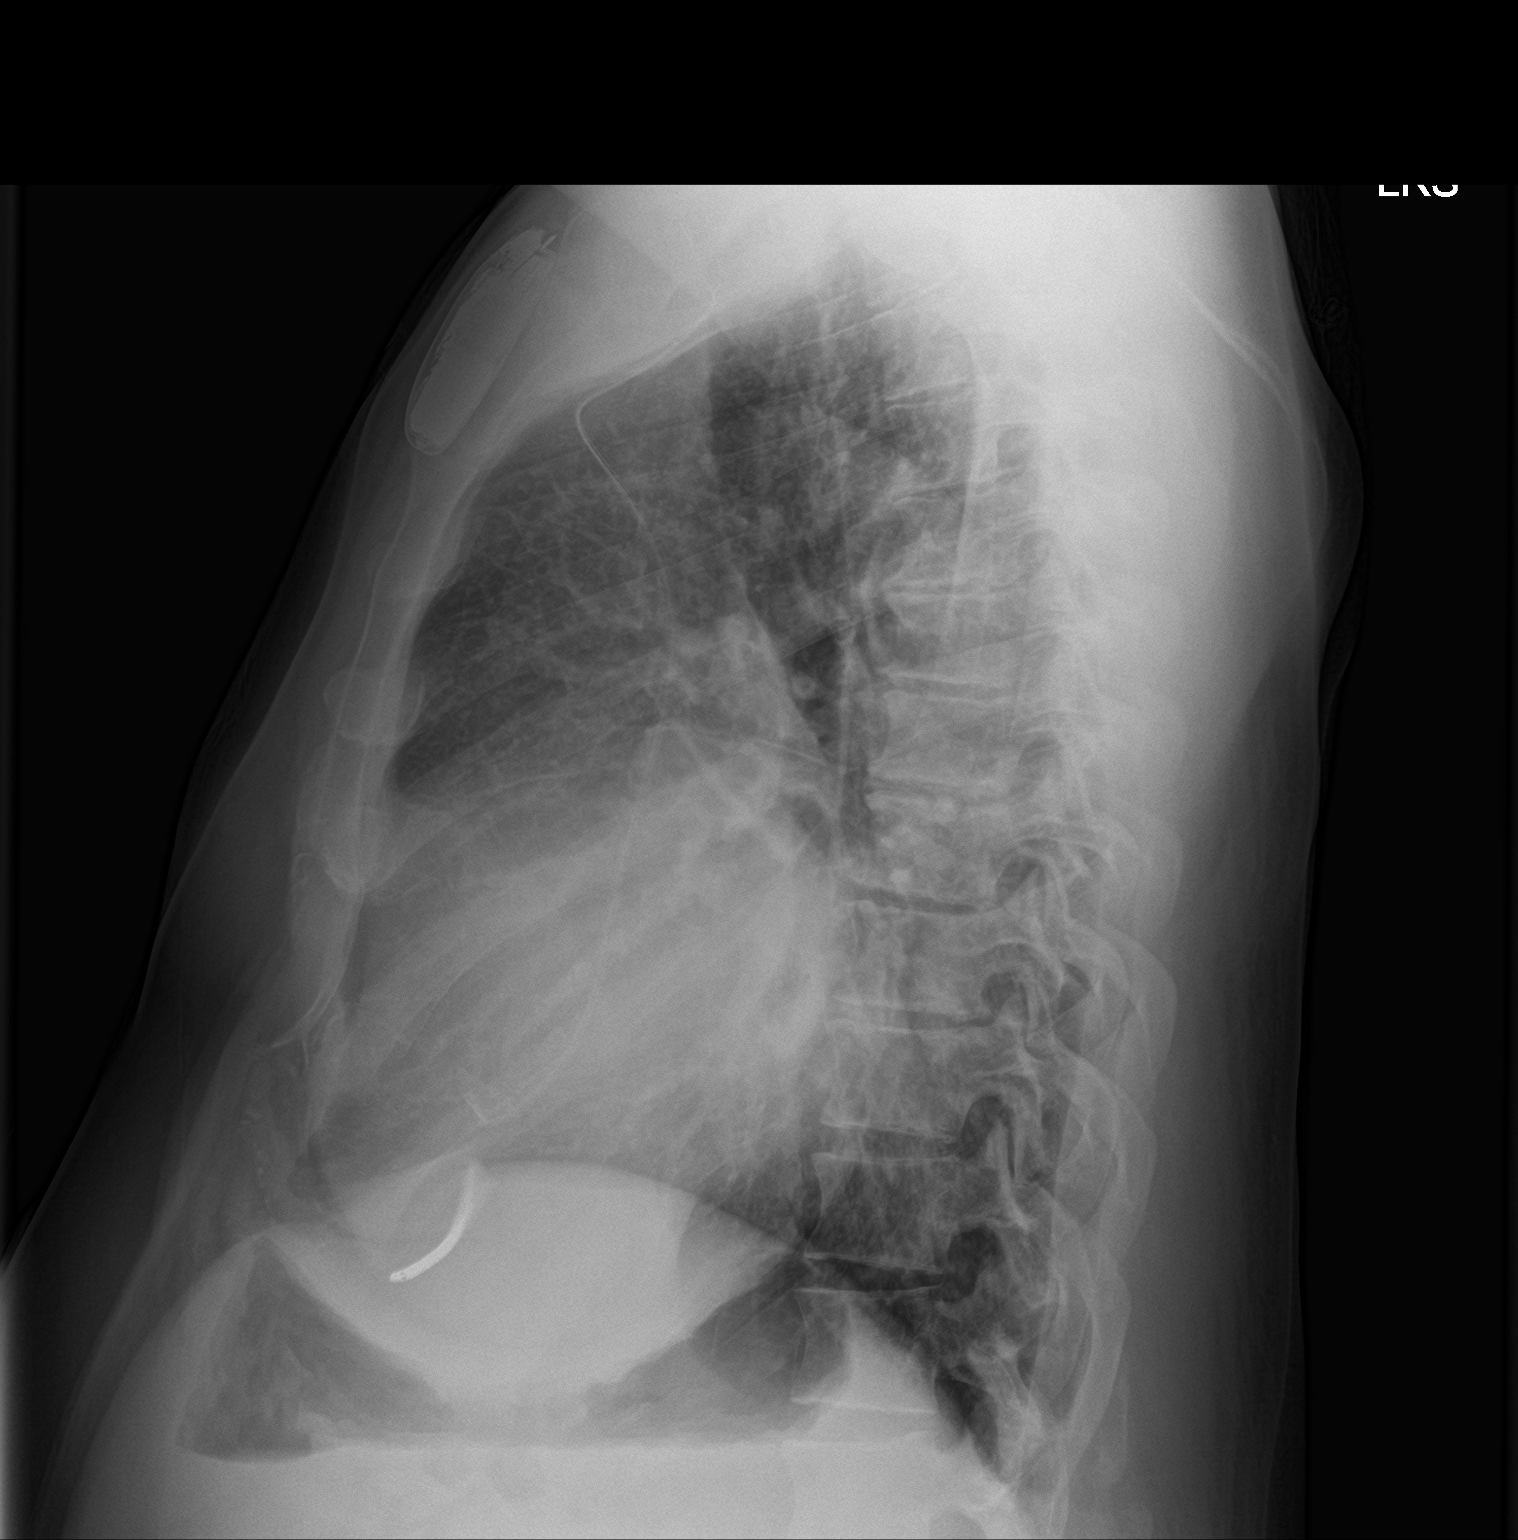

[2 of 2 positions shown; findings below may reference images not displayed]

FINDINGS: Cardiac shadow remains enlarged. Defibrillator is again seen. Mild
central vascular congestion is noted without significant
interstitial edema. The lungs are well aerated bilaterally. No focal
infiltrate or sizable effusion is seen. No acute bony abnormality is
noted.
IMPRESSION: Mild central vascular congestion without significant edema.

Stable cardiomegaly.

## 2020-02-22 IMAGING — DX DG CHEST 1V PORT
1 series · 1 of 1 positions shown · non-contrast
Comparison: 11/24/2018

CLINICAL DATA: Shortness of breath

EXAM:
PORTABLE CHEST 1 VIEW

[chest ap]
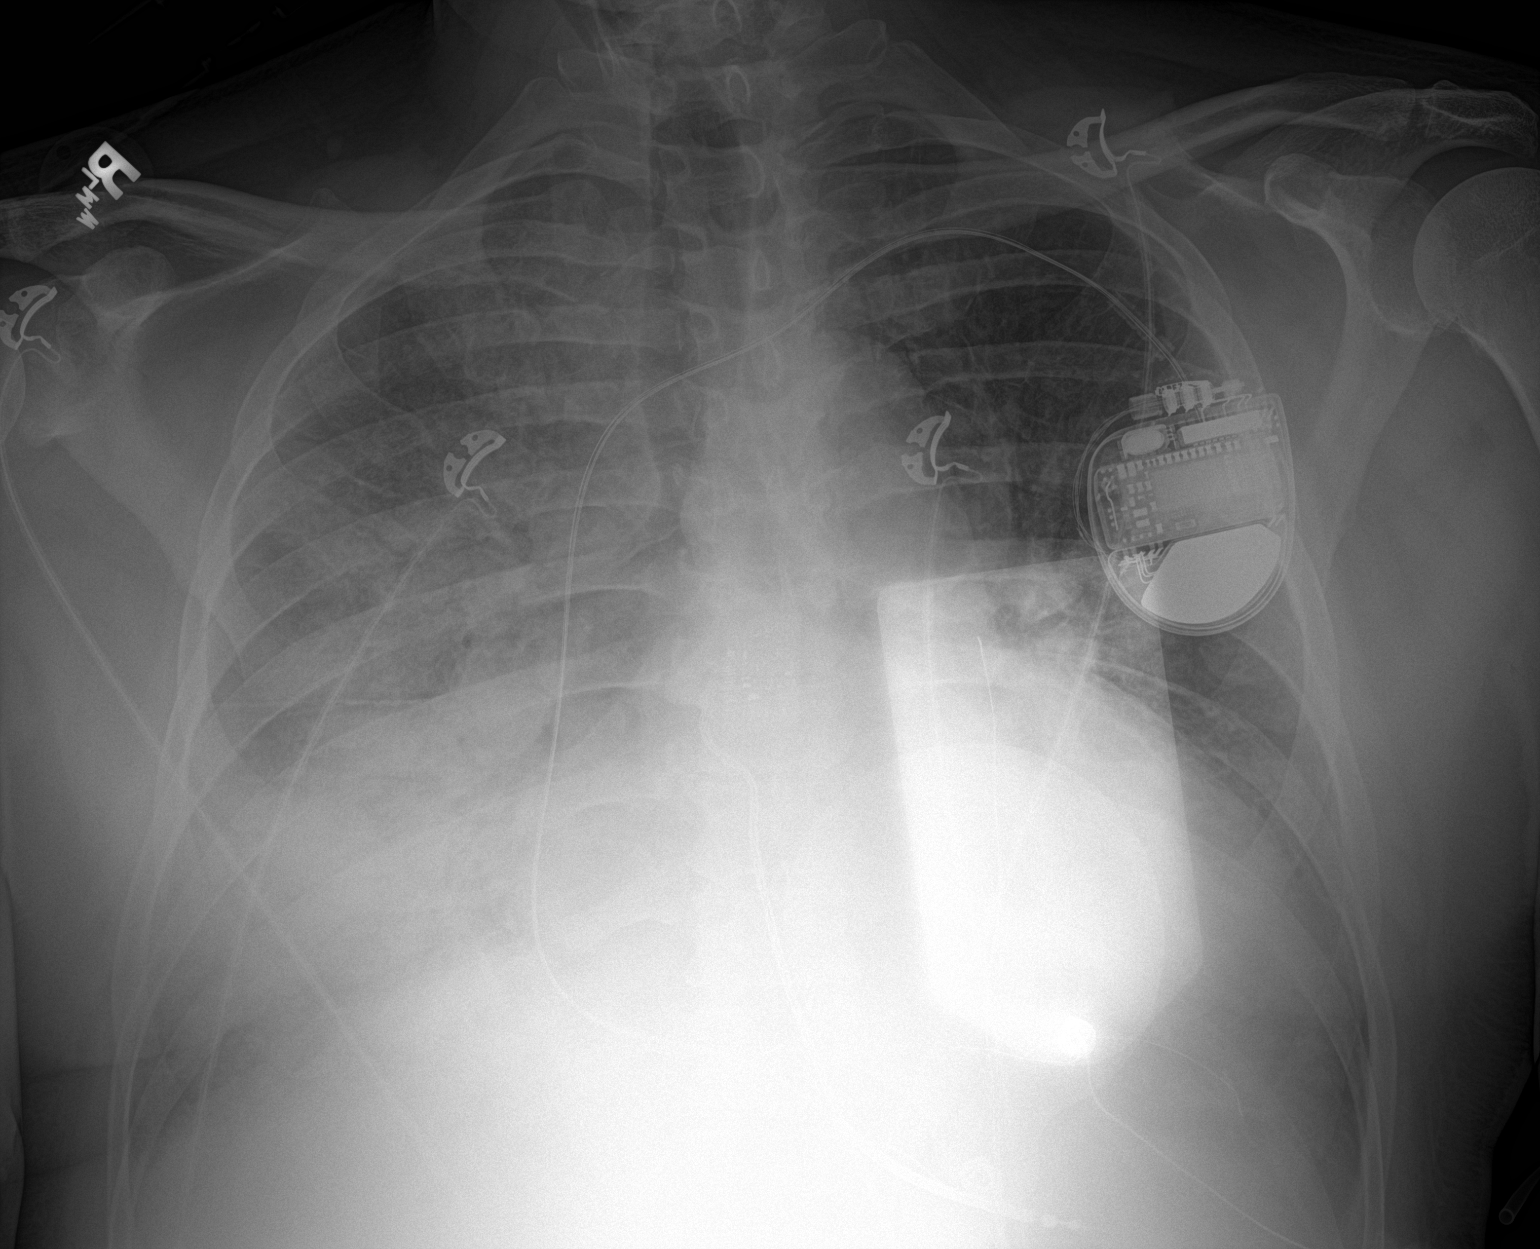

[1 of 1 positions shown; findings below may reference images not displayed]

FINDINGS: Left AICD remains in place, unchanged. Cardiomegaly. Diffuse severe
bilateral airspace disease, right greater than left. No visible
significant effusions. No acute bony abnormality.
IMPRESSION: Severe diffuse bilateral airspace disease, right greater than left.
This could reflect asymmetric edema or infection.

## 2020-03-08 ENCOUNTER — Ambulatory Visit (INDEPENDENT_AMBULATORY_CARE_PROVIDER_SITE_OTHER): Payer: PRIVATE HEALTH INSURANCE | Admitting: *Deleted

## 2020-03-08 DIAGNOSIS — I5022 Chronic systolic (congestive) heart failure: Secondary | ICD-10-CM

## 2020-03-08 LAB — CUP PACEART REMOTE DEVICE CHECK
Battery Remaining Longevity: 114 mo
Battery Voltage: 2.99 V
Brady Statistic RV Percent Paced: 45.19 %
Date Time Interrogation Session: 20210309043724
HighPow Impedance: 65 Ohm
Implantable Lead Implant Date: 20181231
Implantable Lead Location: 753860
Implantable Pulse Generator Implant Date: 20181231
Lead Channel Impedance Value: 304 Ohm
Lead Channel Impedance Value: 399 Ohm
Lead Channel Pacing Threshold Amplitude: 1.75 V
Lead Channel Pacing Threshold Pulse Width: 0.4 ms
Lead Channel Sensing Intrinsic Amplitude: 6.125 mV
Lead Channel Sensing Intrinsic Amplitude: 6.125 mV
Lead Channel Setting Pacing Amplitude: 2.5 V
Lead Channel Setting Pacing Pulse Width: 1 ms
Lead Channel Setting Sensing Sensitivity: 0.3 mV

## 2020-03-09 NOTE — Progress Notes (Signed)
ICD Remote  

## 2020-03-11 IMAGING — CR DG CHEST 2V
1 series · 2 of 2 positions shown · non-contrast
Comparison: 12/07/2018, 11/24/2018

CLINICAL DATA: Cough, shortness of breath, history of COPD and CHF

EXAM:
CHEST - 2 VIEW

[Series 1: dg chest 2 view · 0.14mm/px · 2 of 2 slices shown]
[im 1/2]
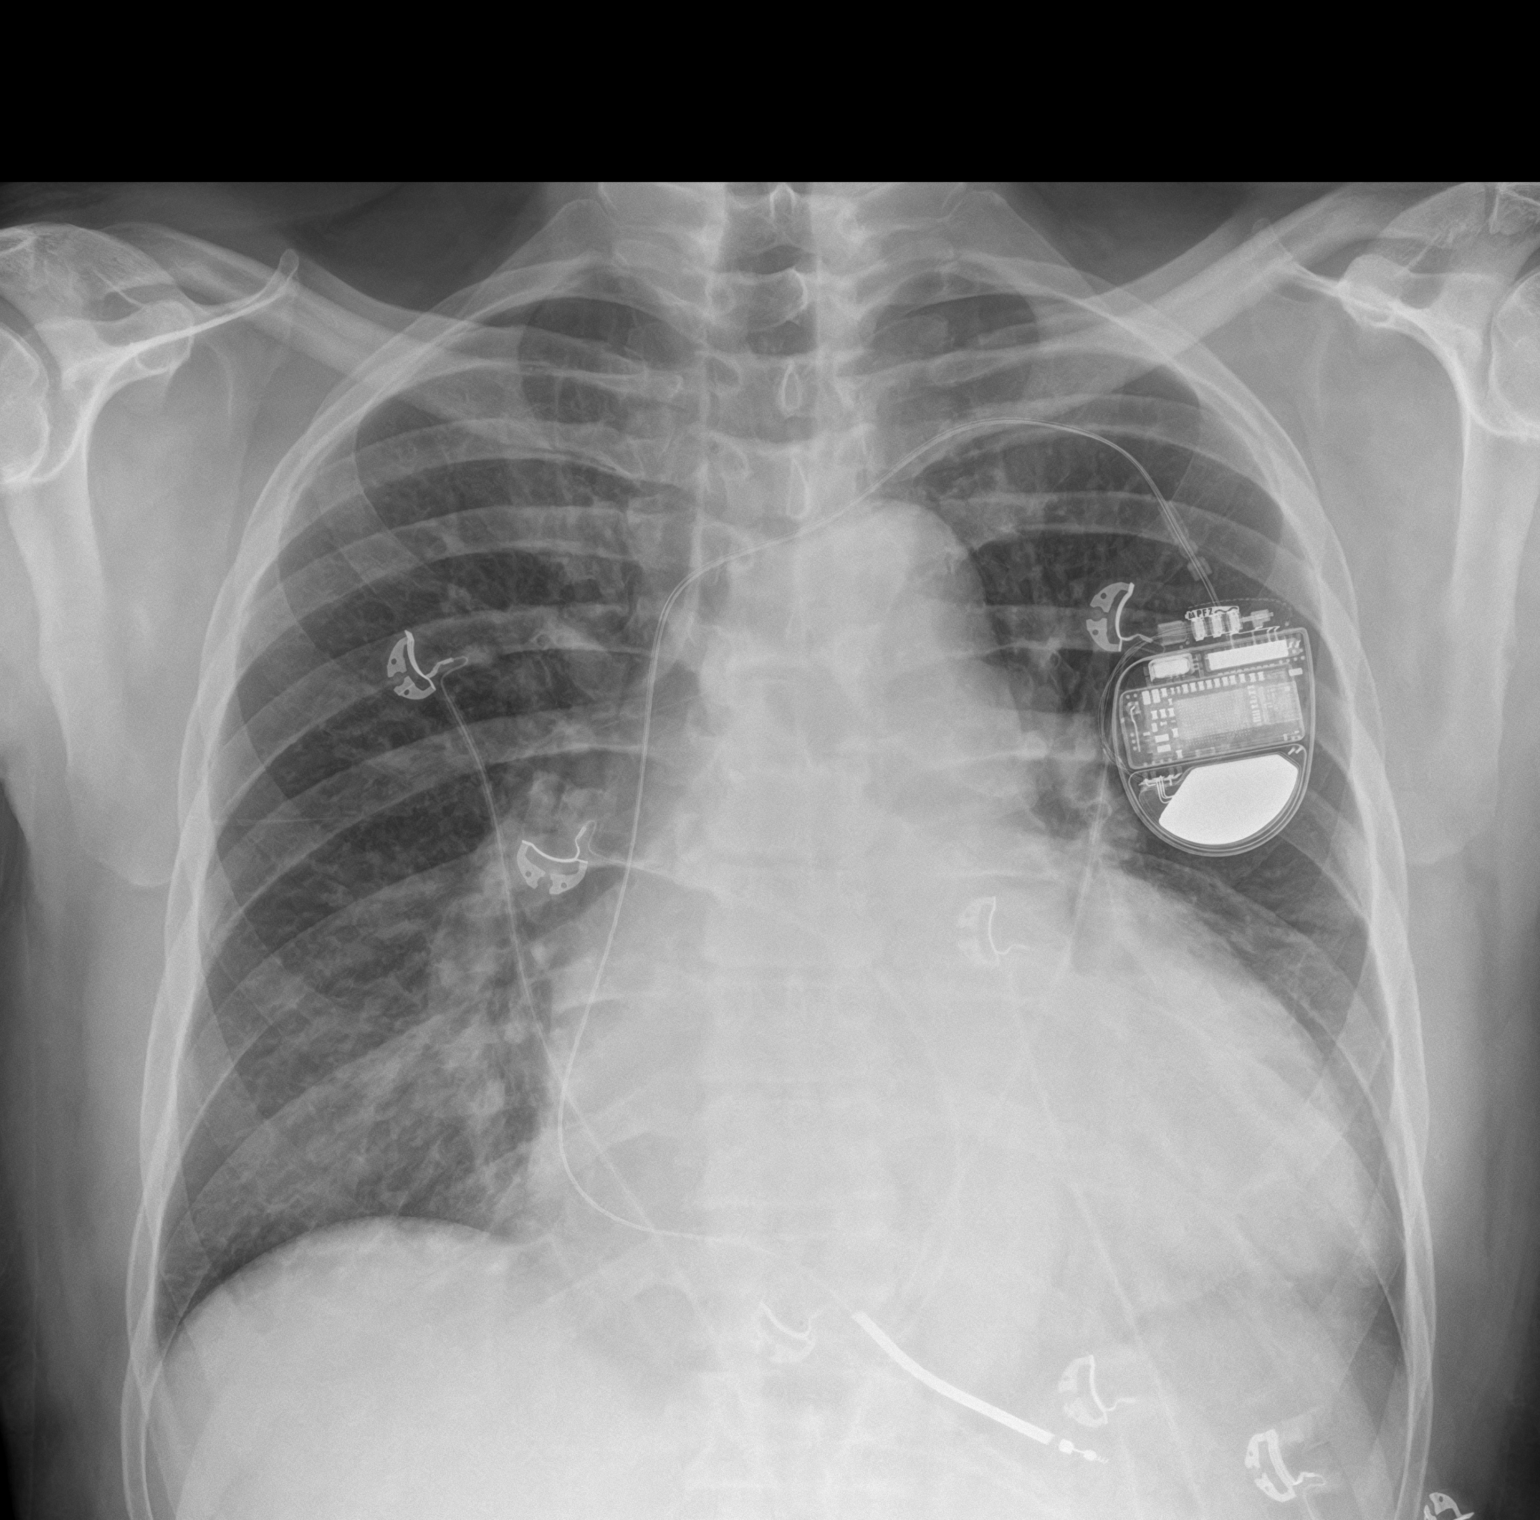
[im 2/2]
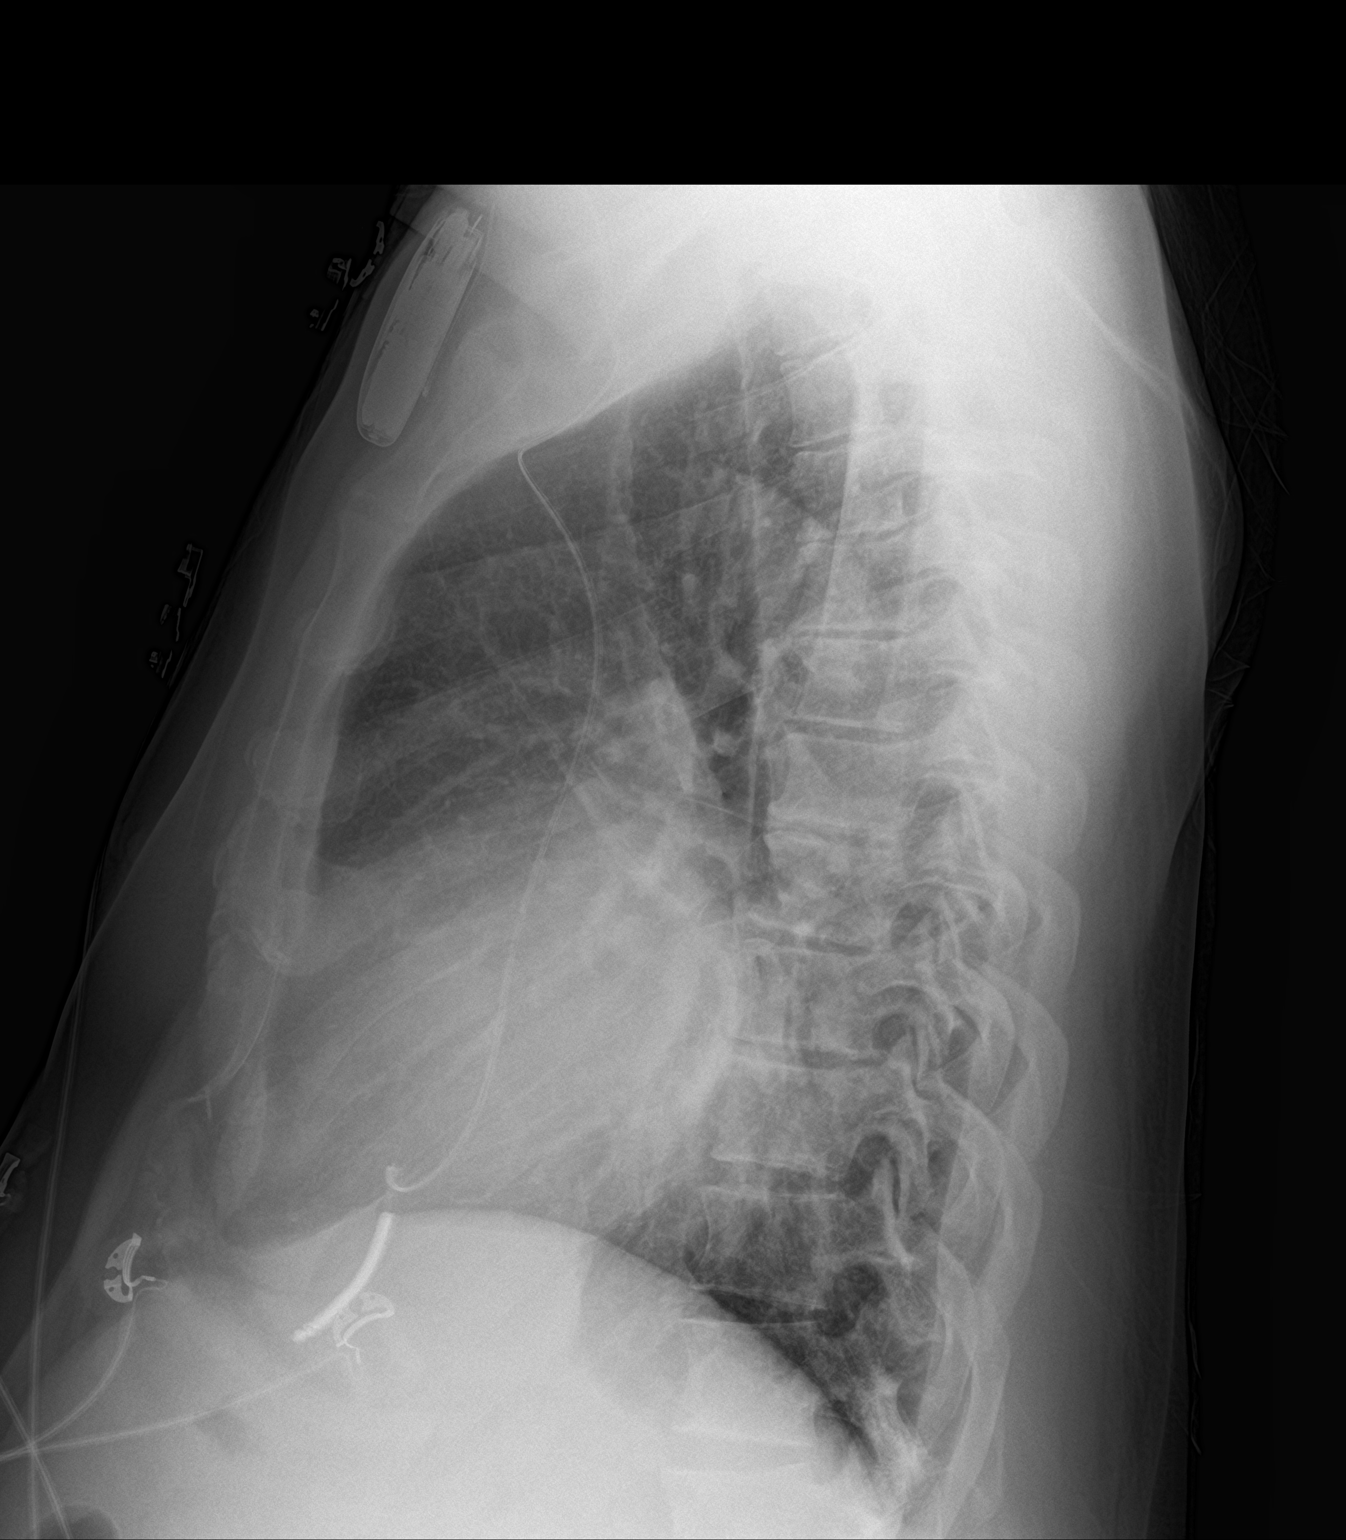

[2 of 2 positions shown; findings below may reference images not displayed]

FINDINGS: Similar cardiomegaly with vascular congestion and basilar
atelectasis. No current CHF pattern, significant pneumonia, collapse
or consolidation. No effusion or pneumothorax. Trachea is midline.
Single lead left subclavian pacer noted. Aorta atherosclerotic and
degenerative changes of the spine.
IMPRESSION: Cardiomegaly with vascular congestion and basilar atelectasis.

## 2020-04-04 ENCOUNTER — Telehealth: Payer: Self-pay | Admitting: Family Medicine

## 2020-04-04 NOTE — Telephone Encounter (Signed)
[employer] and advised that we have received fax advised of process  Copied from [place] [phone]. Topic: General - Other >> Apr 04, 2020  8:30 AM Celene Kras wrote: Reason for CRM: Ahmed, from [facility], called stating that he sent a medical records fax over on 03/14/20 and 03/28/20. He is requesting to follow up on this request. Please advise.

## 2020-06-07 ENCOUNTER — Ambulatory Visit (INDEPENDENT_AMBULATORY_CARE_PROVIDER_SITE_OTHER): Payer: PRIVATE HEALTH INSURANCE | Admitting: *Deleted

## 2020-06-07 DIAGNOSIS — I472 Ventricular tachycardia, unspecified: Secondary | ICD-10-CM

## 2020-06-07 LAB — CUP PACEART REMOTE DEVICE CHECK
Battery Remaining Longevity: 103 mo
Battery Voltage: 2.98 V
Brady Statistic RV Percent Paced: 67.15 %
Date Time Interrogation Session: 20210608012306
HighPow Impedance: 72 Ohm
Implantable Lead Implant Date: 20181231
Implantable Lead Location: 753860
Implantable Pulse Generator Implant Date: 20181231
Lead Channel Impedance Value: 342 Ohm
Lead Channel Impedance Value: 361 Ohm
Lead Channel Pacing Threshold Amplitude: 1.75 V
Lead Channel Pacing Threshold Pulse Width: 0.4 ms
Lead Channel Sensing Intrinsic Amplitude: 6.75 mV
Lead Channel Sensing Intrinsic Amplitude: 6.75 mV
Lead Channel Setting Pacing Amplitude: 2.5 V
Lead Channel Setting Pacing Pulse Width: 1 ms
Lead Channel Setting Sensing Sensitivity: 0.3 mV

## 2020-06-08 DIAGNOSIS — Z7901 Long term (current) use of anticoagulants: Secondary | ICD-10-CM | POA: Diagnosis not present

## 2020-06-08 DIAGNOSIS — Z95811 Presence of heart assist device: Secondary | ICD-10-CM | POA: Diagnosis not present

## 2020-06-08 NOTE — Progress Notes (Signed)
Remote ICD transmission.   

## 2020-07-01 DIAGNOSIS — Y718 Miscellaneous cardiovascular devices associated with adverse incidents, not elsewhere classified: Secondary | ICD-10-CM | POA: Diagnosis not present

## 2020-07-01 DIAGNOSIS — T827XXD Infection and inflammatory reaction due to other cardiac and vascular devices, implants and grafts, subsequent encounter: Secondary | ICD-10-CM | POA: Diagnosis not present

## 2020-07-01 DIAGNOSIS — T829XXD Unspecified complication of cardiac and vascular prosthetic device, implant and graft, subsequent encounter: Secondary | ICD-10-CM | POA: Diagnosis not present

## 2020-07-01 DIAGNOSIS — Z792 Long term (current) use of antibiotics: Secondary | ICD-10-CM | POA: Diagnosis not present

## 2020-07-01 DIAGNOSIS — Z87891 Personal history of nicotine dependence: Secondary | ICD-10-CM | POA: Diagnosis not present

## 2020-07-06 DIAGNOSIS — Z7901 Long term (current) use of anticoagulants: Secondary | ICD-10-CM | POA: Diagnosis not present

## 2020-07-06 DIAGNOSIS — Z95811 Presence of heart assist device: Secondary | ICD-10-CM | POA: Diagnosis not present

## 2020-07-14 DIAGNOSIS — Z95811 Presence of heart assist device: Secondary | ICD-10-CM | POA: Diagnosis not present

## 2020-07-27 DIAGNOSIS — I5022 Chronic systolic (congestive) heart failure: Secondary | ICD-10-CM | POA: Diagnosis not present

## 2020-07-27 DIAGNOSIS — K922 Gastrointestinal hemorrhage, unspecified: Secondary | ICD-10-CM | POA: Diagnosis not present

## 2020-07-27 DIAGNOSIS — Z95811 Presence of heart assist device: Secondary | ICD-10-CM | POA: Diagnosis not present

## 2020-08-01 DIAGNOSIS — R42 Dizziness and giddiness: Secondary | ICD-10-CM | POA: Diagnosis not present

## 2020-08-01 DIAGNOSIS — R791 Abnormal coagulation profile: Secondary | ICD-10-CM | POA: Diagnosis not present

## 2020-08-01 DIAGNOSIS — I5023 Acute on chronic systolic (congestive) heart failure: Secondary | ICD-10-CM | POA: Diagnosis not present

## 2020-08-01 DIAGNOSIS — Z95811 Presence of heart assist device: Secondary | ICD-10-CM | POA: Diagnosis not present

## 2020-08-01 DIAGNOSIS — Z7901 Long term (current) use of anticoagulants: Secondary | ICD-10-CM | POA: Diagnosis not present

## 2020-08-01 DIAGNOSIS — E860 Dehydration: Secondary | ICD-10-CM | POA: Diagnosis not present

## 2020-08-01 DIAGNOSIS — I10 Essential (primary) hypertension: Secondary | ICD-10-CM | POA: Diagnosis not present

## 2020-08-01 DIAGNOSIS — R609 Edema, unspecified: Secondary | ICD-10-CM | POA: Diagnosis not present

## 2020-08-01 DIAGNOSIS — I130 Hypertensive heart and chronic kidney disease with heart failure and stage 1 through stage 4 chronic kidney disease, or unspecified chronic kidney disease: Secondary | ICD-10-CM | POA: Diagnosis not present

## 2020-08-01 DIAGNOSIS — R000 Tachycardia, unspecified: Secondary | ICD-10-CM | POA: Diagnosis not present

## 2020-08-01 DIAGNOSIS — R0902 Hypoxemia: Secondary | ICD-10-CM | POA: Diagnosis not present

## 2020-08-01 DIAGNOSIS — Z87891 Personal history of nicotine dependence: Secondary | ICD-10-CM | POA: Diagnosis not present

## 2020-08-01 DIAGNOSIS — R55 Syncope and collapse: Secondary | ICD-10-CM | POA: Diagnosis not present

## 2020-08-01 DIAGNOSIS — N183 Chronic kidney disease, stage 3 unspecified: Secondary | ICD-10-CM | POA: Diagnosis not present

## 2020-08-01 DIAGNOSIS — N178 Other acute kidney failure: Secondary | ICD-10-CM | POA: Diagnosis not present

## 2020-08-02 DIAGNOSIS — K921 Melena: Secondary | ICD-10-CM | POA: Diagnosis not present

## 2020-08-02 DIAGNOSIS — I428 Other cardiomyopathies: Secondary | ICD-10-CM | POA: Diagnosis not present

## 2020-08-02 DIAGNOSIS — R55 Syncope and collapse: Secondary | ICD-10-CM | POA: Diagnosis not present

## 2020-08-02 DIAGNOSIS — Z95811 Presence of heart assist device: Secondary | ICD-10-CM | POA: Diagnosis not present

## 2020-08-03 DIAGNOSIS — T501X1A Poisoning by loop [high-ceiling] diuretics, accidental (unintentional), initial encounter: Secondary | ICD-10-CM | POA: Diagnosis not present

## 2020-08-03 DIAGNOSIS — N179 Acute kidney failure, unspecified: Secondary | ICD-10-CM | POA: Diagnosis not present

## 2020-08-03 DIAGNOSIS — I428 Other cardiomyopathies: Secondary | ICD-10-CM | POA: Diagnosis not present

## 2020-08-03 DIAGNOSIS — D62 Acute posthemorrhagic anemia: Secondary | ICD-10-CM | POA: Diagnosis not present

## 2020-08-03 DIAGNOSIS — Z95811 Presence of heart assist device: Secondary | ICD-10-CM | POA: Diagnosis not present

## 2020-08-03 DIAGNOSIS — K921 Melena: Secondary | ICD-10-CM | POA: Diagnosis not present

## 2020-08-03 DIAGNOSIS — I7410 Embolism and thrombosis of unspecified parts of aorta: Secondary | ICD-10-CM | POA: Diagnosis not present

## 2020-08-03 DIAGNOSIS — I5022 Chronic systolic (congestive) heart failure: Secondary | ICD-10-CM | POA: Diagnosis not present

## 2020-08-03 DIAGNOSIS — I130 Hypertensive heart and chronic kidney disease with heart failure and stage 1 through stage 4 chronic kidney disease, or unspecified chronic kidney disease: Secondary | ICD-10-CM | POA: Diagnosis not present

## 2020-08-03 DIAGNOSIS — K259 Gastric ulcer, unspecified as acute or chronic, without hemorrhage or perforation: Secondary | ICD-10-CM | POA: Diagnosis not present

## 2020-08-03 DIAGNOSIS — K3189 Other diseases of stomach and duodenum: Secondary | ICD-10-CM | POA: Diagnosis not present

## 2020-08-03 DIAGNOSIS — R55 Syncope and collapse: Secondary | ICD-10-CM | POA: Diagnosis not present

## 2020-08-10 DIAGNOSIS — Z7901 Long term (current) use of anticoagulants: Secondary | ICD-10-CM | POA: Diagnosis not present

## 2020-08-10 DIAGNOSIS — Z95811 Presence of heart assist device: Secondary | ICD-10-CM | POA: Diagnosis not present

## 2020-08-11 DIAGNOSIS — Z95811 Presence of heart assist device: Secondary | ICD-10-CM | POA: Diagnosis not present

## 2020-08-11 DIAGNOSIS — I428 Other cardiomyopathies: Secondary | ICD-10-CM | POA: Diagnosis not present

## 2020-08-11 DIAGNOSIS — K921 Melena: Secondary | ICD-10-CM | POA: Diagnosis not present

## 2020-08-11 DIAGNOSIS — Z7901 Long term (current) use of anticoagulants: Secondary | ICD-10-CM | POA: Diagnosis not present

## 2020-08-11 DIAGNOSIS — I5022 Chronic systolic (congestive) heart failure: Secondary | ICD-10-CM | POA: Diagnosis not present

## 2020-08-11 DIAGNOSIS — I10 Essential (primary) hypertension: Secondary | ICD-10-CM | POA: Diagnosis not present

## 2020-08-12 DIAGNOSIS — N189 Chronic kidney disease, unspecified: Secondary | ICD-10-CM | POA: Diagnosis not present

## 2020-08-12 DIAGNOSIS — K921 Melena: Secondary | ICD-10-CM | POA: Diagnosis not present

## 2020-08-12 DIAGNOSIS — D631 Anemia in chronic kidney disease: Secondary | ICD-10-CM | POA: Diagnosis not present

## 2020-08-17 DIAGNOSIS — Z7901 Long term (current) use of anticoagulants: Secondary | ICD-10-CM | POA: Diagnosis not present

## 2020-08-17 DIAGNOSIS — Z4509 Encounter for adjustment and management of other cardiac device: Secondary | ICD-10-CM | POA: Diagnosis not present

## 2020-08-17 DIAGNOSIS — K921 Melena: Secondary | ICD-10-CM | POA: Diagnosis not present

## 2020-08-17 DIAGNOSIS — M25561 Pain in right knee: Secondary | ICD-10-CM | POA: Diagnosis not present

## 2020-08-17 DIAGNOSIS — B952 Enterococcus as the cause of diseases classified elsewhere: Secondary | ICD-10-CM | POA: Diagnosis not present

## 2020-08-17 DIAGNOSIS — Z95811 Presence of heart assist device: Secondary | ICD-10-CM | POA: Diagnosis not present

## 2020-08-17 DIAGNOSIS — I472 Ventricular tachycardia: Secondary | ICD-10-CM | POA: Diagnosis not present

## 2020-08-17 DIAGNOSIS — I10 Essential (primary) hypertension: Secondary | ICD-10-CM | POA: Diagnosis not present

## 2020-08-17 DIAGNOSIS — I493 Ventricular premature depolarization: Secondary | ICD-10-CM | POA: Diagnosis not present

## 2020-08-17 DIAGNOSIS — N529 Male erectile dysfunction, unspecified: Secondary | ICD-10-CM | POA: Diagnosis not present

## 2020-08-17 DIAGNOSIS — N183 Chronic kidney disease, stage 3 unspecified: Secondary | ICD-10-CM | POA: Diagnosis not present

## 2020-08-17 DIAGNOSIS — I428 Other cardiomyopathies: Secondary | ICD-10-CM | POA: Diagnosis not present

## 2020-08-17 DIAGNOSIS — I5022 Chronic systolic (congestive) heart failure: Secondary | ICD-10-CM | POA: Diagnosis not present

## 2020-08-17 DIAGNOSIS — I130 Hypertensive heart and chronic kidney disease with heart failure and stage 1 through stage 4 chronic kidney disease, or unspecified chronic kidney disease: Secondary | ICD-10-CM | POA: Diagnosis not present

## 2020-08-17 DIAGNOSIS — M25562 Pain in left knee: Secondary | ICD-10-CM | POA: Diagnosis not present

## 2020-08-17 DIAGNOSIS — Z95810 Presence of automatic (implantable) cardiac defibrillator: Secondary | ICD-10-CM | POA: Diagnosis not present

## 2020-08-19 DIAGNOSIS — Z125 Encounter for screening for malignant neoplasm of prostate: Secondary | ICD-10-CM | POA: Diagnosis not present

## 2020-08-19 DIAGNOSIS — R7989 Other specified abnormal findings of blood chemistry: Secondary | ICD-10-CM | POA: Diagnosis not present

## 2020-08-19 DIAGNOSIS — I5022 Chronic systolic (congestive) heart failure: Secondary | ICD-10-CM | POA: Diagnosis not present

## 2020-08-19 DIAGNOSIS — Z95811 Presence of heart assist device: Secondary | ICD-10-CM | POA: Diagnosis not present

## 2020-08-19 DIAGNOSIS — N529 Male erectile dysfunction, unspecified: Secondary | ICD-10-CM | POA: Diagnosis not present

## 2020-08-27 DIAGNOSIS — I739 Peripheral vascular disease, unspecified: Secondary | ICD-10-CM | POA: Diagnosis not present

## 2020-08-27 DIAGNOSIS — I429 Cardiomyopathy, unspecified: Secondary | ICD-10-CM | POA: Diagnosis not present

## 2020-08-27 DIAGNOSIS — I10 Essential (primary) hypertension: Secondary | ICD-10-CM | POA: Diagnosis not present

## 2020-08-27 DIAGNOSIS — I2510 Atherosclerotic heart disease of native coronary artery without angina pectoris: Secondary | ICD-10-CM | POA: Diagnosis not present

## 2020-08-27 DIAGNOSIS — Z95811 Presence of heart assist device: Secondary | ICD-10-CM | POA: Diagnosis not present

## 2020-08-27 DIAGNOSIS — E785 Hyperlipidemia, unspecified: Secondary | ICD-10-CM | POA: Diagnosis not present

## 2020-08-27 DIAGNOSIS — G8929 Other chronic pain: Secondary | ICD-10-CM | POA: Diagnosis not present

## 2020-08-27 DIAGNOSIS — E669 Obesity, unspecified: Secondary | ICD-10-CM | POA: Diagnosis not present

## 2020-08-27 DIAGNOSIS — I4891 Unspecified atrial fibrillation: Secondary | ICD-10-CM | POA: Diagnosis not present

## 2020-08-27 DIAGNOSIS — D6859 Other primary thrombophilia: Secondary | ICD-10-CM | POA: Diagnosis not present

## 2020-09-06 ENCOUNTER — Ambulatory Visit (INDEPENDENT_AMBULATORY_CARE_PROVIDER_SITE_OTHER): Payer: Medicare HMO | Admitting: *Deleted

## 2020-09-06 DIAGNOSIS — I472 Ventricular tachycardia, unspecified: Secondary | ICD-10-CM

## 2020-09-06 LAB — CUP PACEART REMOTE DEVICE CHECK
Battery Remaining Longevity: 86 mo
Battery Voltage: 2.97 V
Brady Statistic RV Percent Paced: 80.93 %
Date Time Interrogation Session: 20210907001708
HighPow Impedance: 78 Ohm
Implantable Lead Implant Date: 20181231
Implantable Lead Location: 753860
Implantable Pulse Generator Implant Date: 20181231
Lead Channel Impedance Value: 342 Ohm
Lead Channel Impedance Value: 399 Ohm
Lead Channel Pacing Threshold Amplitude: 1.75 V
Lead Channel Pacing Threshold Pulse Width: 0.4 ms
Lead Channel Sensing Intrinsic Amplitude: 7.375 mV
Lead Channel Sensing Intrinsic Amplitude: 7.375 mV
Lead Channel Setting Pacing Amplitude: 2.5 V
Lead Channel Setting Pacing Pulse Width: 1 ms
Lead Channel Setting Sensing Sensitivity: 0.3 mV

## 2020-09-08 NOTE — Progress Notes (Signed)
Remote ICD transmission.   

## 2020-09-11 DIAGNOSIS — Z7901 Long term (current) use of anticoagulants: Secondary | ICD-10-CM | POA: Diagnosis not present

## 2020-09-11 DIAGNOSIS — Z95811 Presence of heart assist device: Secondary | ICD-10-CM | POA: Diagnosis not present

## 2020-09-11 IMAGING — DX PORTABLE CHEST - 1 VIEW
1 series · 1 of 1 positions shown · non-contrast
Comparison: 06/21/2019.

CLINICAL DATA: Shortness of breath.

EXAM:
PORTABLE CHEST 1 VIEW

[chest ap]
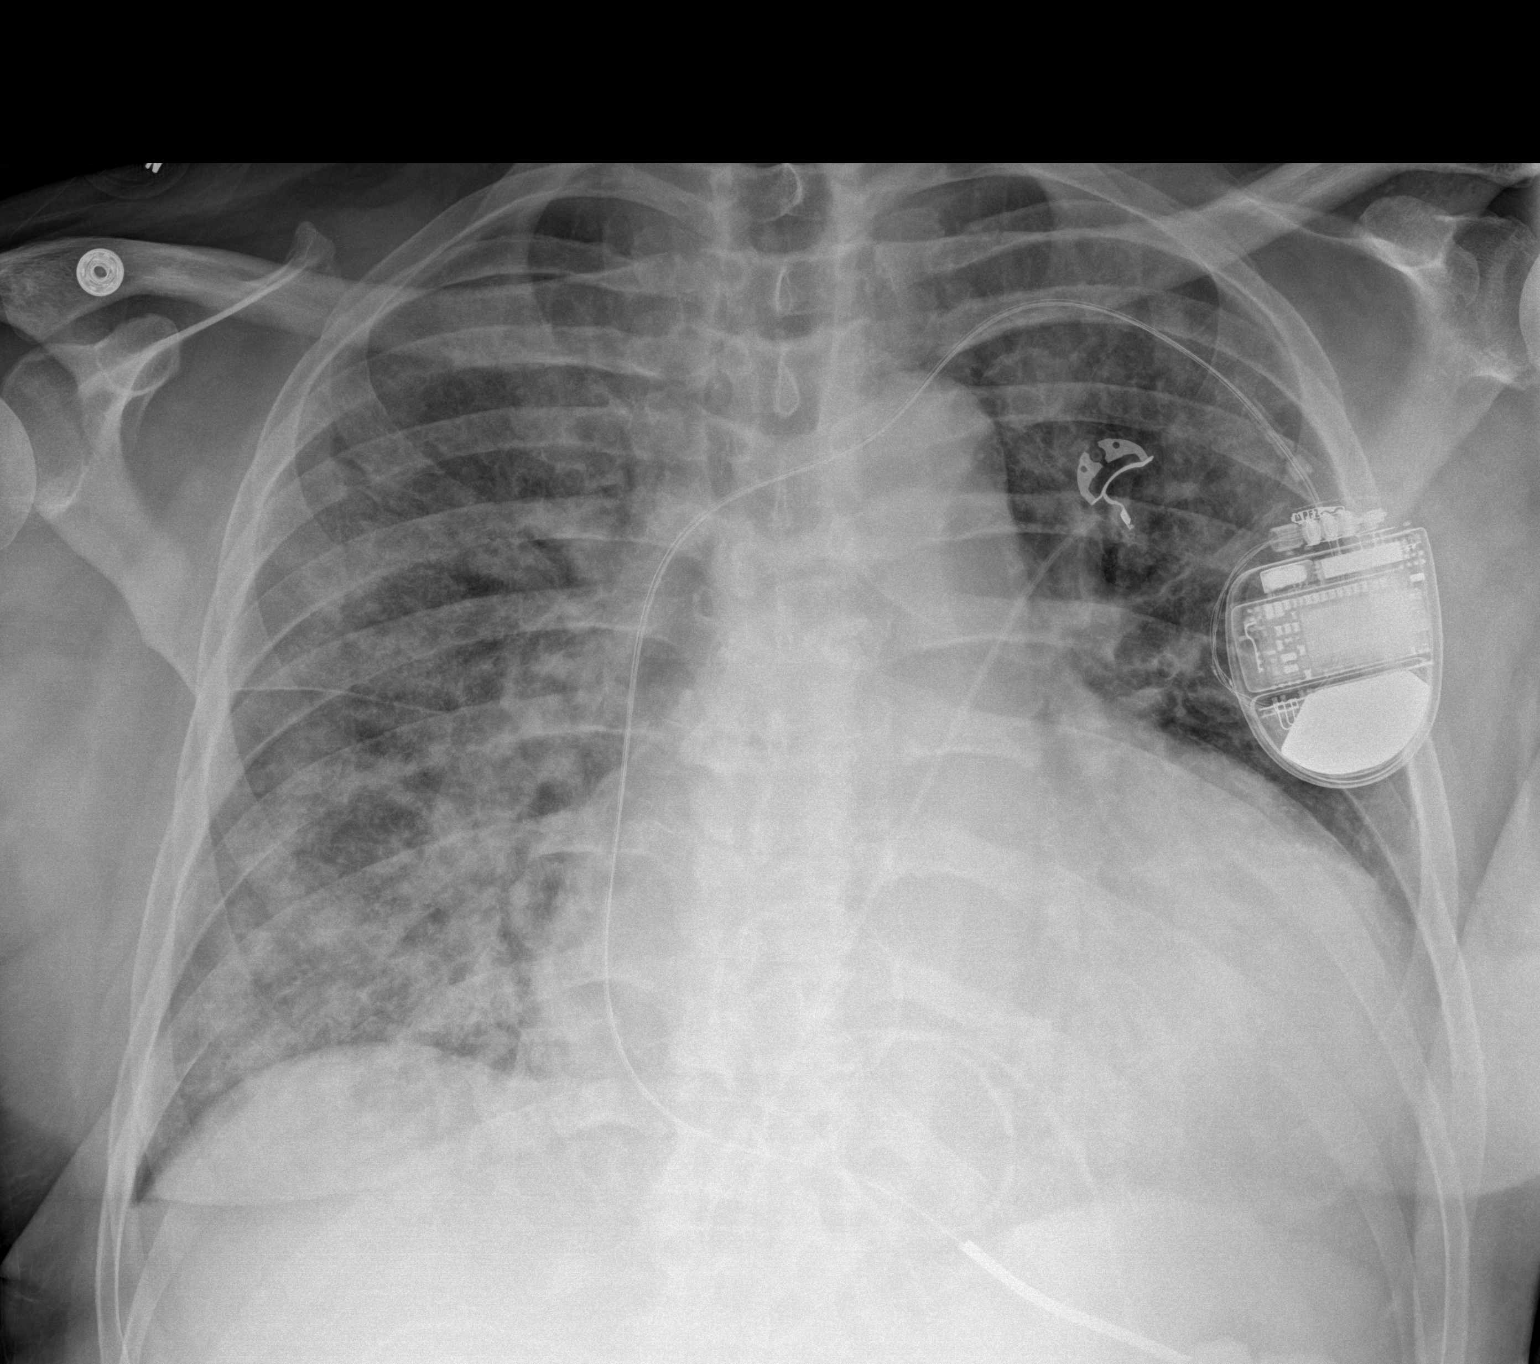

[1 of 1 positions shown; findings below may reference images not displayed]

FINDINGS: Cardiac pacer stable position. Stable severe cardiomegaly. Diffuse
bilateral pulmonary infiltrates/edema right side greater than left
again noted. Similar findings on prior exam. No prominent pleural
effusion or pneumothorax.
IMPRESSION: 1.  Cardiac pacer stable position.  Severe cardiomegaly again noted.

2. Diffuse bilateral pulmonary infiltrates/edema right side greater
than left again noted. Similar findings on prior exam.

## 2020-09-12 DIAGNOSIS — R69 Illness, unspecified: Secondary | ICD-10-CM | POA: Diagnosis not present

## 2020-09-12 DIAGNOSIS — J449 Chronic obstructive pulmonary disease, unspecified: Secondary | ICD-10-CM | POA: Diagnosis not present

## 2020-09-12 DIAGNOSIS — I420 Dilated cardiomyopathy: Secondary | ICD-10-CM | POA: Diagnosis not present

## 2020-09-12 DIAGNOSIS — I509 Heart failure, unspecified: Secondary | ICD-10-CM | POA: Diagnosis not present

## 2020-09-12 DIAGNOSIS — Z113 Encounter for screening for infections with a predominantly sexual mode of transmission: Secondary | ICD-10-CM | POA: Diagnosis not present

## 2020-09-12 DIAGNOSIS — G459 Transient cerebral ischemic attack, unspecified: Secondary | ICD-10-CM | POA: Diagnosis not present

## 2020-09-12 DIAGNOSIS — Z01818 Encounter for other preprocedural examination: Secondary | ICD-10-CM | POA: Diagnosis not present

## 2020-09-12 DIAGNOSIS — I110 Hypertensive heart disease with heart failure: Secondary | ICD-10-CM | POA: Diagnosis not present

## 2020-09-12 DIAGNOSIS — Z1159 Encounter for screening for other viral diseases: Secondary | ICD-10-CM | POA: Diagnosis not present

## 2020-09-12 DIAGNOSIS — Z01810 Encounter for preprocedural cardiovascular examination: Secondary | ICD-10-CM | POA: Diagnosis not present

## 2020-09-13 DIAGNOSIS — J449 Chronic obstructive pulmonary disease, unspecified: Secondary | ICD-10-CM | POA: Diagnosis not present

## 2020-09-13 DIAGNOSIS — I10 Essential (primary) hypertension: Secondary | ICD-10-CM | POA: Diagnosis not present

## 2020-09-13 DIAGNOSIS — Z01818 Encounter for other preprocedural examination: Secondary | ICD-10-CM | POA: Diagnosis not present

## 2020-09-13 DIAGNOSIS — I509 Heart failure, unspecified: Secondary | ICD-10-CM | POA: Diagnosis not present

## 2020-09-13 DIAGNOSIS — R918 Other nonspecific abnormal finding of lung field: Secondary | ICD-10-CM | POA: Diagnosis not present

## 2020-09-13 DIAGNOSIS — R69 Illness, unspecified: Secondary | ICD-10-CM | POA: Diagnosis not present

## 2020-09-13 DIAGNOSIS — I110 Hypertensive heart disease with heart failure: Secondary | ICD-10-CM | POA: Diagnosis not present

## 2020-09-13 DIAGNOSIS — Z7682 Awaiting organ transplant status: Secondary | ICD-10-CM | POA: Diagnosis not present

## 2020-09-13 DIAGNOSIS — Z1159 Encounter for screening for other viral diseases: Secondary | ICD-10-CM | POA: Diagnosis not present

## 2020-09-13 DIAGNOSIS — I5022 Chronic systolic (congestive) heart failure: Secondary | ICD-10-CM | POA: Diagnosis not present

## 2020-09-13 DIAGNOSIS — Z79899 Other long term (current) drug therapy: Secondary | ICD-10-CM | POA: Diagnosis not present

## 2020-09-13 DIAGNOSIS — I130 Hypertensive heart and chronic kidney disease with heart failure and stage 1 through stage 4 chronic kidney disease, or unspecified chronic kidney disease: Secondary | ICD-10-CM | POA: Diagnosis not present

## 2020-09-13 DIAGNOSIS — N183 Chronic kidney disease, stage 3 unspecified: Secondary | ICD-10-CM | POA: Diagnosis not present

## 2020-09-13 DIAGNOSIS — Z95810 Presence of automatic (implantable) cardiac defibrillator: Secondary | ICD-10-CM | POA: Diagnosis not present

## 2020-09-13 DIAGNOSIS — Z87891 Personal history of nicotine dependence: Secondary | ICD-10-CM | POA: Diagnosis not present

## 2020-09-13 DIAGNOSIS — Z01810 Encounter for preprocedural cardiovascular examination: Secondary | ICD-10-CM | POA: Diagnosis not present

## 2020-09-13 DIAGNOSIS — G459 Transient cerebral ischemic attack, unspecified: Secondary | ICD-10-CM | POA: Diagnosis not present

## 2020-09-13 DIAGNOSIS — R0602 Shortness of breath: Secondary | ICD-10-CM | POA: Diagnosis not present

## 2020-09-14 DIAGNOSIS — Z87891 Personal history of nicotine dependence: Secondary | ICD-10-CM | POA: Diagnosis not present

## 2020-09-14 DIAGNOSIS — I130 Hypertensive heart and chronic kidney disease with heart failure and stage 1 through stage 4 chronic kidney disease, or unspecified chronic kidney disease: Secondary | ICD-10-CM | POA: Diagnosis not present

## 2020-09-14 DIAGNOSIS — I10 Essential (primary) hypertension: Secondary | ICD-10-CM | POA: Diagnosis not present

## 2020-09-14 DIAGNOSIS — G459 Transient cerebral ischemic attack, unspecified: Secondary | ICD-10-CM | POA: Diagnosis not present

## 2020-09-14 DIAGNOSIS — I110 Hypertensive heart disease with heart failure: Secondary | ICD-10-CM | POA: Diagnosis not present

## 2020-09-14 DIAGNOSIS — I509 Heart failure, unspecified: Secondary | ICD-10-CM | POA: Diagnosis not present

## 2020-09-14 DIAGNOSIS — J449 Chronic obstructive pulmonary disease, unspecified: Secondary | ICD-10-CM | POA: Diagnosis not present

## 2020-09-14 DIAGNOSIS — Z01810 Encounter for preprocedural cardiovascular examination: Secondary | ICD-10-CM | POA: Diagnosis not present

## 2020-09-14 DIAGNOSIS — Z01818 Encounter for other preprocedural examination: Secondary | ICD-10-CM | POA: Diagnosis not present

## 2020-09-14 DIAGNOSIS — Z8673 Personal history of transient ischemic attack (TIA), and cerebral infarction without residual deficits: Secondary | ICD-10-CM | POA: Diagnosis not present

## 2020-09-14 DIAGNOSIS — Z713 Dietary counseling and surveillance: Secondary | ICD-10-CM | POA: Diagnosis not present

## 2020-09-14 DIAGNOSIS — Z1159 Encounter for screening for other viral diseases: Secondary | ICD-10-CM | POA: Diagnosis not present

## 2020-09-14 DIAGNOSIS — I5022 Chronic systolic (congestive) heart failure: Secondary | ICD-10-CM | POA: Diagnosis not present

## 2020-09-14 DIAGNOSIS — Z95811 Presence of heart assist device: Secondary | ICD-10-CM | POA: Diagnosis not present

## 2020-09-14 DIAGNOSIS — N183 Chronic kidney disease, stage 3 unspecified: Secondary | ICD-10-CM | POA: Diagnosis not present

## 2020-09-14 DIAGNOSIS — R69 Illness, unspecified: Secondary | ICD-10-CM | POA: Diagnosis not present

## 2020-09-14 DIAGNOSIS — I428 Other cardiomyopathies: Secondary | ICD-10-CM | POA: Diagnosis not present

## 2020-09-15 DIAGNOSIS — I130 Hypertensive heart and chronic kidney disease with heart failure and stage 1 through stage 4 chronic kidney disease, or unspecified chronic kidney disease: Secondary | ICD-10-CM | POA: Diagnosis not present

## 2020-09-15 DIAGNOSIS — Z8673 Personal history of transient ischemic attack (TIA), and cerebral infarction without residual deficits: Secondary | ICD-10-CM | POA: Diagnosis not present

## 2020-09-15 DIAGNOSIS — Z7682 Awaiting organ transplant status: Secondary | ICD-10-CM | POA: Diagnosis not present

## 2020-09-15 DIAGNOSIS — I428 Other cardiomyopathies: Secondary | ICD-10-CM | POA: Diagnosis not present

## 2020-09-15 DIAGNOSIS — I4901 Ventricular fibrillation: Secondary | ICD-10-CM | POA: Diagnosis not present

## 2020-09-15 DIAGNOSIS — Z01810 Encounter for preprocedural cardiovascular examination: Secondary | ICD-10-CM | POA: Diagnosis not present

## 2020-09-15 DIAGNOSIS — Z95810 Presence of automatic (implantable) cardiac defibrillator: Secondary | ICD-10-CM | POA: Diagnosis not present

## 2020-09-15 DIAGNOSIS — I5022 Chronic systolic (congestive) heart failure: Secondary | ICD-10-CM | POA: Diagnosis not present

## 2020-09-15 DIAGNOSIS — E785 Hyperlipidemia, unspecified: Secondary | ICD-10-CM | POA: Diagnosis not present

## 2020-09-16 DIAGNOSIS — I10 Essential (primary) hypertension: Secondary | ICD-10-CM | POA: Diagnosis not present

## 2020-09-16 DIAGNOSIS — Z1159 Encounter for screening for other viral diseases: Secondary | ICD-10-CM | POA: Diagnosis not present

## 2020-09-16 DIAGNOSIS — Z01818 Encounter for other preprocedural examination: Secondary | ICD-10-CM | POA: Diagnosis not present

## 2020-09-16 DIAGNOSIS — Z01810 Encounter for preprocedural cardiovascular examination: Secondary | ICD-10-CM | POA: Diagnosis not present

## 2020-09-16 DIAGNOSIS — G459 Transient cerebral ischemic attack, unspecified: Secondary | ICD-10-CM | POA: Diagnosis not present

## 2020-09-16 DIAGNOSIS — R69 Illness, unspecified: Secondary | ICD-10-CM | POA: Diagnosis not present

## 2020-09-16 DIAGNOSIS — I509 Heart failure, unspecified: Secondary | ICD-10-CM | POA: Diagnosis not present

## 2020-09-16 DIAGNOSIS — I110 Hypertensive heart disease with heart failure: Secondary | ICD-10-CM | POA: Diagnosis not present

## 2020-09-16 DIAGNOSIS — J449 Chronic obstructive pulmonary disease, unspecified: Secondary | ICD-10-CM | POA: Diagnosis not present

## 2020-09-19 DIAGNOSIS — G8929 Other chronic pain: Secondary | ICD-10-CM | POA: Diagnosis not present

## 2020-09-19 DIAGNOSIS — M1712 Unilateral primary osteoarthritis, left knee: Secondary | ICD-10-CM | POA: Diagnosis not present

## 2020-09-19 DIAGNOSIS — M25562 Pain in left knee: Secondary | ICD-10-CM | POA: Diagnosis not present

## 2020-09-19 DIAGNOSIS — M25561 Pain in right knee: Secondary | ICD-10-CM | POA: Diagnosis not present

## 2020-09-19 DIAGNOSIS — M1711 Unilateral primary osteoarthritis, right knee: Secondary | ICD-10-CM | POA: Diagnosis not present

## 2020-10-05 DIAGNOSIS — Z95811 Presence of heart assist device: Secondary | ICD-10-CM | POA: Diagnosis not present

## 2020-10-12 DIAGNOSIS — Z7901 Long term (current) use of anticoagulants: Secondary | ICD-10-CM | POA: Diagnosis not present

## 2020-10-12 DIAGNOSIS — Z95811 Presence of heart assist device: Secondary | ICD-10-CM | POA: Diagnosis not present

## 2020-11-09 DIAGNOSIS — Z95811 Presence of heart assist device: Secondary | ICD-10-CM | POA: Diagnosis not present

## 2020-11-09 DIAGNOSIS — Z7901 Long term (current) use of anticoagulants: Secondary | ICD-10-CM | POA: Diagnosis not present

## 2020-11-16 DIAGNOSIS — M1990 Unspecified osteoarthritis, unspecified site: Secondary | ICD-10-CM | POA: Diagnosis not present

## 2020-11-16 DIAGNOSIS — Z7901 Long term (current) use of anticoagulants: Secondary | ICD-10-CM | POA: Diagnosis not present

## 2020-11-16 DIAGNOSIS — Z95811 Presence of heart assist device: Secondary | ICD-10-CM | POA: Diagnosis not present

## 2020-11-16 DIAGNOSIS — I5022 Chronic systolic (congestive) heart failure: Secondary | ICD-10-CM | POA: Diagnosis not present

## 2020-12-06 ENCOUNTER — Ambulatory Visit (INDEPENDENT_AMBULATORY_CARE_PROVIDER_SITE_OTHER): Payer: Medicare HMO

## 2020-12-06 DIAGNOSIS — I472 Ventricular tachycardia, unspecified: Secondary | ICD-10-CM

## 2020-12-06 LAB — CUP PACEART REMOTE DEVICE CHECK
Battery Remaining Longevity: 71 mo
Battery Voltage: 2.97 V
Brady Statistic RV Percent Paced: 91.55 %
Date Time Interrogation Session: 20211207001805
HighPow Impedance: 75 Ohm
Implantable Lead Implant Date: 20181231
Implantable Lead Location: 753860
Implantable Pulse Generator Implant Date: 20181231
Lead Channel Impedance Value: 342 Ohm
Lead Channel Impedance Value: 399 Ohm
Lead Channel Pacing Threshold Amplitude: 1.75 V
Lead Channel Pacing Threshold Pulse Width: 0.4 ms
Lead Channel Sensing Intrinsic Amplitude: 7.375 mV
Lead Channel Sensing Intrinsic Amplitude: 7.375 mV
Lead Channel Setting Pacing Amplitude: 2.5 V
Lead Channel Setting Pacing Pulse Width: 1 ms
Lead Channel Setting Sensing Sensitivity: 0.3 mV

## 2020-12-07 DIAGNOSIS — Z7901 Long term (current) use of anticoagulants: Secondary | ICD-10-CM | POA: Diagnosis not present

## 2020-12-07 DIAGNOSIS — Z95811 Presence of heart assist device: Secondary | ICD-10-CM | POA: Diagnosis not present

## 2020-12-19 NOTE — Progress Notes (Signed)
Remote ICD transmission.   

## 2021-01-04 DIAGNOSIS — Z95811 Presence of heart assist device: Secondary | ICD-10-CM | POA: Diagnosis not present

## 2021-01-04 DIAGNOSIS — Z7901 Long term (current) use of anticoagulants: Secondary | ICD-10-CM | POA: Diagnosis not present

## 2021-02-01 DIAGNOSIS — Z7901 Long term (current) use of anticoagulants: Secondary | ICD-10-CM | POA: Diagnosis not present

## 2021-02-01 DIAGNOSIS — Z95811 Presence of heart assist device: Secondary | ICD-10-CM | POA: Diagnosis not present

## 2021-02-13 DIAGNOSIS — J449 Chronic obstructive pulmonary disease, unspecified: Secondary | ICD-10-CM | POA: Diagnosis not present

## 2021-02-13 DIAGNOSIS — N1831 Chronic kidney disease, stage 3a: Secondary | ICD-10-CM | POA: Diagnosis not present

## 2021-02-13 DIAGNOSIS — E785 Hyperlipidemia, unspecified: Secondary | ICD-10-CM | POA: Diagnosis not present

## 2021-02-13 DIAGNOSIS — I495 Sick sinus syndrome: Secondary | ICD-10-CM | POA: Diagnosis not present

## 2021-02-13 DIAGNOSIS — I428 Other cardiomyopathies: Secondary | ICD-10-CM | POA: Diagnosis not present

## 2021-02-13 DIAGNOSIS — Z95811 Presence of heart assist device: Secondary | ICD-10-CM | POA: Diagnosis not present

## 2021-02-13 DIAGNOSIS — K219 Gastro-esophageal reflux disease without esophagitis: Secondary | ICD-10-CM | POA: Diagnosis not present

## 2021-02-13 DIAGNOSIS — I5022 Chronic systolic (congestive) heart failure: Secondary | ICD-10-CM | POA: Diagnosis not present

## 2021-02-13 DIAGNOSIS — Z0000 Encounter for general adult medical examination without abnormal findings: Secondary | ICD-10-CM | POA: Diagnosis not present

## 2021-02-13 DIAGNOSIS — I130 Hypertensive heart and chronic kidney disease with heart failure and stage 1 through stage 4 chronic kidney disease, or unspecified chronic kidney disease: Secondary | ICD-10-CM | POA: Diagnosis not present

## 2021-02-14 DIAGNOSIS — I5022 Chronic systolic (congestive) heart failure: Secondary | ICD-10-CM | POA: Diagnosis not present

## 2021-02-14 DIAGNOSIS — Z0000 Encounter for general adult medical examination without abnormal findings: Secondary | ICD-10-CM | POA: Diagnosis not present

## 2021-02-14 DIAGNOSIS — Z125 Encounter for screening for malignant neoplasm of prostate: Secondary | ICD-10-CM | POA: Diagnosis not present

## 2021-02-14 DIAGNOSIS — E785 Hyperlipidemia, unspecified: Secondary | ICD-10-CM | POA: Diagnosis not present

## 2021-02-21 DIAGNOSIS — M25562 Pain in left knee: Secondary | ICD-10-CM | POA: Diagnosis not present

## 2021-02-21 DIAGNOSIS — Z95811 Presence of heart assist device: Secondary | ICD-10-CM | POA: Diagnosis not present

## 2021-02-21 DIAGNOSIS — I5022 Chronic systolic (congestive) heart failure: Secondary | ICD-10-CM | POA: Diagnosis not present

## 2021-02-21 DIAGNOSIS — I420 Dilated cardiomyopathy: Secondary | ICD-10-CM | POA: Diagnosis not present

## 2021-02-21 DIAGNOSIS — Z7901 Long term (current) use of anticoagulants: Secondary | ICD-10-CM | POA: Diagnosis not present

## 2021-02-21 DIAGNOSIS — J439 Emphysema, unspecified: Secondary | ICD-10-CM | POA: Diagnosis not present

## 2021-02-21 DIAGNOSIS — M1712 Unilateral primary osteoarthritis, left knee: Secondary | ICD-10-CM | POA: Diagnosis not present

## 2021-03-01 DIAGNOSIS — Z95811 Presence of heart assist device: Secondary | ICD-10-CM | POA: Diagnosis not present

## 2021-03-01 DIAGNOSIS — Z7901 Long term (current) use of anticoagulants: Secondary | ICD-10-CM | POA: Diagnosis not present

## 2021-03-07 ENCOUNTER — Ambulatory Visit (INDEPENDENT_AMBULATORY_CARE_PROVIDER_SITE_OTHER): Payer: Medicare HMO

## 2021-03-07 DIAGNOSIS — I472 Ventricular tachycardia, unspecified: Secondary | ICD-10-CM

## 2021-03-07 LAB — CUP PACEART REMOTE DEVICE CHECK
Battery Remaining Longevity: 60 mo
Battery Voltage: 2.96 V
Brady Statistic RV Percent Paced: 92.33 %
Date Time Interrogation Session: 20220308033524
HighPow Impedance: 80 Ohm
Implantable Lead Implant Date: 20181231
Implantable Lead Location: 753860
Implantable Pulse Generator Implant Date: 20181231
Lead Channel Impedance Value: 399 Ohm
Lead Channel Impedance Value: 456 Ohm
Lead Channel Pacing Threshold Amplitude: 1.75 V
Lead Channel Pacing Threshold Pulse Width: 0.4 ms
Lead Channel Sensing Intrinsic Amplitude: 8.125 mV
Lead Channel Sensing Intrinsic Amplitude: 8.125 mV
Lead Channel Setting Pacing Amplitude: 2.5 V
Lead Channel Setting Pacing Pulse Width: 1 ms
Lead Channel Setting Sensing Sensitivity: 0.3 mV

## 2021-03-14 DIAGNOSIS — E669 Obesity, unspecified: Secondary | ICD-10-CM | POA: Diagnosis not present

## 2021-03-14 DIAGNOSIS — D6869 Other thrombophilia: Secondary | ICD-10-CM | POA: Diagnosis not present

## 2021-03-14 DIAGNOSIS — I110 Hypertensive heart disease with heart failure: Secondary | ICD-10-CM | POA: Diagnosis not present

## 2021-03-14 DIAGNOSIS — I429 Cardiomyopathy, unspecified: Secondary | ICD-10-CM | POA: Diagnosis not present

## 2021-03-14 DIAGNOSIS — K219 Gastro-esophageal reflux disease without esophagitis: Secondary | ICD-10-CM | POA: Diagnosis not present

## 2021-03-14 DIAGNOSIS — I739 Peripheral vascular disease, unspecified: Secondary | ICD-10-CM | POA: Diagnosis not present

## 2021-03-14 DIAGNOSIS — I4891 Unspecified atrial fibrillation: Secondary | ICD-10-CM | POA: Diagnosis not present

## 2021-03-14 DIAGNOSIS — I509 Heart failure, unspecified: Secondary | ICD-10-CM | POA: Diagnosis not present

## 2021-03-14 DIAGNOSIS — I2510 Atherosclerotic heart disease of native coronary artery without angina pectoris: Secondary | ICD-10-CM | POA: Diagnosis not present

## 2021-03-14 DIAGNOSIS — E785 Hyperlipidemia, unspecified: Secondary | ICD-10-CM | POA: Diagnosis not present

## 2021-03-15 NOTE — Progress Notes (Signed)
Remote ICD transmission.   

## 2021-03-16 DIAGNOSIS — Z95811 Presence of heart assist device: Secondary | ICD-10-CM | POA: Diagnosis not present

## 2021-03-21 DIAGNOSIS — I700 Atherosclerosis of aorta: Secondary | ICD-10-CM | POA: Insufficient documentation

## 2021-03-31 DIAGNOSIS — Z7901 Long term (current) use of anticoagulants: Secondary | ICD-10-CM | POA: Diagnosis not present

## 2021-03-31 DIAGNOSIS — Z95810 Presence of automatic (implantable) cardiac defibrillator: Secondary | ICD-10-CM | POA: Diagnosis not present

## 2021-03-31 DIAGNOSIS — Z95811 Presence of heart assist device: Secondary | ICD-10-CM | POA: Diagnosis not present

## 2021-03-31 DIAGNOSIS — I5022 Chronic systolic (congestive) heart failure: Secondary | ICD-10-CM | POA: Diagnosis not present

## 2021-04-05 DIAGNOSIS — Z95811 Presence of heart assist device: Secondary | ICD-10-CM | POA: Diagnosis not present

## 2021-04-05 DIAGNOSIS — Z7901 Long term (current) use of anticoagulants: Secondary | ICD-10-CM | POA: Diagnosis not present

## 2021-04-12 DIAGNOSIS — Z7901 Long term (current) use of anticoagulants: Secondary | ICD-10-CM | POA: Diagnosis not present

## 2021-04-19 DIAGNOSIS — Z95811 Presence of heart assist device: Secondary | ICD-10-CM | POA: Diagnosis not present

## 2021-04-19 DIAGNOSIS — Z7901 Long term (current) use of anticoagulants: Secondary | ICD-10-CM | POA: Diagnosis not present

## 2021-05-03 DIAGNOSIS — R69 Illness, unspecified: Secondary | ICD-10-CM | POA: Diagnosis not present

## 2021-05-03 DIAGNOSIS — N401 Enlarged prostate with lower urinary tract symptoms: Secondary | ICD-10-CM | POA: Diagnosis not present

## 2021-05-03 DIAGNOSIS — N3943 Post-void dribbling: Secondary | ICD-10-CM | POA: Diagnosis not present

## 2021-05-17 DIAGNOSIS — Z95811 Presence of heart assist device: Secondary | ICD-10-CM | POA: Diagnosis not present

## 2021-05-17 DIAGNOSIS — I472 Ventricular tachycardia: Secondary | ICD-10-CM | POA: Diagnosis not present

## 2021-05-17 DIAGNOSIS — N183 Chronic kidney disease, stage 3 unspecified: Secondary | ICD-10-CM | POA: Diagnosis not present

## 2021-05-17 DIAGNOSIS — Z95810 Presence of automatic (implantable) cardiac defibrillator: Secondary | ICD-10-CM | POA: Diagnosis not present

## 2021-05-17 DIAGNOSIS — E785 Hyperlipidemia, unspecified: Secondary | ICD-10-CM | POA: Diagnosis not present

## 2021-05-17 DIAGNOSIS — K921 Melena: Secondary | ICD-10-CM | POA: Diagnosis not present

## 2021-05-17 DIAGNOSIS — B952 Enterococcus as the cause of diseases classified elsewhere: Secondary | ICD-10-CM | POA: Diagnosis not present

## 2021-05-17 DIAGNOSIS — I5022 Chronic systolic (congestive) heart failure: Secondary | ICD-10-CM | POA: Diagnosis not present

## 2021-05-17 DIAGNOSIS — M1711 Unilateral primary osteoarthritis, right knee: Secondary | ICD-10-CM | POA: Diagnosis not present

## 2021-05-17 DIAGNOSIS — I130 Hypertensive heart and chronic kidney disease with heart failure and stage 1 through stage 4 chronic kidney disease, or unspecified chronic kidney disease: Secondary | ICD-10-CM | POA: Diagnosis not present

## 2021-05-17 DIAGNOSIS — N529 Male erectile dysfunction, unspecified: Secondary | ICD-10-CM | POA: Diagnosis not present

## 2021-05-17 DIAGNOSIS — I428 Other cardiomyopathies: Secondary | ICD-10-CM | POA: Diagnosis not present

## 2021-05-17 DIAGNOSIS — I493 Ventricular premature depolarization: Secondary | ICD-10-CM | POA: Diagnosis not present

## 2021-05-17 DIAGNOSIS — Z7901 Long term (current) use of anticoagulants: Secondary | ICD-10-CM | POA: Diagnosis not present

## 2021-05-17 DIAGNOSIS — I10 Essential (primary) hypertension: Secondary | ICD-10-CM | POA: Diagnosis not present

## 2021-05-17 DIAGNOSIS — Z23 Encounter for immunization: Secondary | ICD-10-CM | POA: Diagnosis not present

## 2021-05-26 DIAGNOSIS — I5022 Chronic systolic (congestive) heart failure: Secondary | ICD-10-CM | POA: Diagnosis not present

## 2021-05-26 DIAGNOSIS — Z95811 Presence of heart assist device: Secondary | ICD-10-CM | POA: Diagnosis not present

## 2021-06-14 DIAGNOSIS — N401 Enlarged prostate with lower urinary tract symptoms: Secondary | ICD-10-CM | POA: Diagnosis not present

## 2021-06-14 DIAGNOSIS — N3943 Post-void dribbling: Secondary | ICD-10-CM | POA: Diagnosis not present

## 2021-06-14 DIAGNOSIS — R69 Illness, unspecified: Secondary | ICD-10-CM | POA: Diagnosis not present

## 2021-06-14 DIAGNOSIS — N529 Male erectile dysfunction, unspecified: Secondary | ICD-10-CM | POA: Diagnosis not present

## 2021-06-16 DIAGNOSIS — I5022 Chronic systolic (congestive) heart failure: Secondary | ICD-10-CM | POA: Diagnosis not present

## 2021-06-22 DIAGNOSIS — Z7901 Long term (current) use of anticoagulants: Secondary | ICD-10-CM | POA: Diagnosis not present

## 2021-06-22 DIAGNOSIS — Z95811 Presence of heart assist device: Secondary | ICD-10-CM | POA: Diagnosis not present

## 2021-06-23 DIAGNOSIS — Z95811 Presence of heart assist device: Secondary | ICD-10-CM | POA: Diagnosis not present

## 2021-06-23 DIAGNOSIS — M170 Bilateral primary osteoarthritis of knee: Secondary | ICD-10-CM | POA: Diagnosis not present

## 2021-06-23 DIAGNOSIS — N1831 Chronic kidney disease, stage 3a: Secondary | ICD-10-CM | POA: Diagnosis not present

## 2021-06-27 DIAGNOSIS — Z95811 Presence of heart assist device: Secondary | ICD-10-CM | POA: Diagnosis not present

## 2021-07-07 DIAGNOSIS — M170 Bilateral primary osteoarthritis of knee: Secondary | ICD-10-CM | POA: Diagnosis not present

## 2021-07-21 DIAGNOSIS — Z7901 Long term (current) use of anticoagulants: Secondary | ICD-10-CM | POA: Diagnosis not present

## 2021-07-21 DIAGNOSIS — Z95811 Presence of heart assist device: Secondary | ICD-10-CM | POA: Diagnosis not present

## 2021-08-14 DIAGNOSIS — I130 Hypertensive heart and chronic kidney disease with heart failure and stage 1 through stage 4 chronic kidney disease, or unspecified chronic kidney disease: Secondary | ICD-10-CM | POA: Diagnosis not present

## 2021-08-14 DIAGNOSIS — I495 Sick sinus syndrome: Secondary | ICD-10-CM | POA: Diagnosis not present

## 2021-08-14 DIAGNOSIS — J449 Chronic obstructive pulmonary disease, unspecified: Secondary | ICD-10-CM | POA: Diagnosis not present

## 2021-08-14 DIAGNOSIS — I428 Other cardiomyopathies: Secondary | ICD-10-CM | POA: Diagnosis not present

## 2021-08-14 DIAGNOSIS — K219 Gastro-esophageal reflux disease without esophagitis: Secondary | ICD-10-CM | POA: Diagnosis not present

## 2021-08-14 DIAGNOSIS — I5022 Chronic systolic (congestive) heart failure: Secondary | ICD-10-CM | POA: Diagnosis not present

## 2021-08-14 DIAGNOSIS — E785 Hyperlipidemia, unspecified: Secondary | ICD-10-CM | POA: Diagnosis not present

## 2021-08-14 DIAGNOSIS — Z95811 Presence of heart assist device: Secondary | ICD-10-CM | POA: Diagnosis not present

## 2021-08-14 DIAGNOSIS — Z7901 Long term (current) use of anticoagulants: Secondary | ICD-10-CM | POA: Diagnosis not present

## 2021-08-14 DIAGNOSIS — N400 Enlarged prostate without lower urinary tract symptoms: Secondary | ICD-10-CM | POA: Diagnosis not present

## 2021-08-14 DIAGNOSIS — Z87891 Personal history of nicotine dependence: Secondary | ICD-10-CM | POA: Diagnosis not present

## 2021-08-14 DIAGNOSIS — N1831 Chronic kidney disease, stage 3a: Secondary | ICD-10-CM | POA: Diagnosis not present

## 2021-08-16 DIAGNOSIS — Z7901 Long term (current) use of anticoagulants: Secondary | ICD-10-CM | POA: Diagnosis not present

## 2021-08-16 DIAGNOSIS — I5022 Chronic systolic (congestive) heart failure: Secondary | ICD-10-CM | POA: Diagnosis not present

## 2021-08-16 DIAGNOSIS — N183 Chronic kidney disease, stage 3 unspecified: Secondary | ICD-10-CM | POA: Diagnosis not present

## 2021-08-16 DIAGNOSIS — Z4509 Encounter for adjustment and management of other cardiac device: Secondary | ICD-10-CM | POA: Diagnosis not present

## 2021-08-16 DIAGNOSIS — Z23 Encounter for immunization: Secondary | ICD-10-CM | POA: Diagnosis not present

## 2021-08-16 DIAGNOSIS — I493 Ventricular premature depolarization: Secondary | ICD-10-CM | POA: Diagnosis not present

## 2021-08-16 DIAGNOSIS — Z95810 Presence of automatic (implantable) cardiac defibrillator: Secondary | ICD-10-CM | POA: Diagnosis not present

## 2021-08-16 DIAGNOSIS — I130 Hypertensive heart and chronic kidney disease with heart failure and stage 1 through stage 4 chronic kidney disease, or unspecified chronic kidney disease: Secondary | ICD-10-CM | POA: Diagnosis not present

## 2021-08-16 DIAGNOSIS — Z95811 Presence of heart assist device: Secondary | ICD-10-CM | POA: Diagnosis not present

## 2021-08-16 DIAGNOSIS — I444 Left anterior fascicular block: Secondary | ICD-10-CM | POA: Diagnosis not present

## 2021-08-16 DIAGNOSIS — B952 Enterococcus as the cause of diseases classified elsewhere: Secondary | ICD-10-CM | POA: Diagnosis not present

## 2021-08-16 DIAGNOSIS — I428 Other cardiomyopathies: Secondary | ICD-10-CM | POA: Diagnosis not present

## 2021-08-16 DIAGNOSIS — I10 Essential (primary) hypertension: Secondary | ICD-10-CM | POA: Diagnosis not present

## 2021-08-16 DIAGNOSIS — I071 Rheumatic tricuspid insufficiency: Secondary | ICD-10-CM | POA: Diagnosis not present

## 2021-08-16 DIAGNOSIS — I4891 Unspecified atrial fibrillation: Secondary | ICD-10-CM | POA: Diagnosis not present

## 2021-08-16 DIAGNOSIS — N1831 Chronic kidney disease, stage 3a: Secondary | ICD-10-CM | POA: Diagnosis not present

## 2021-08-16 DIAGNOSIS — I472 Ventricular tachycardia: Secondary | ICD-10-CM | POA: Diagnosis not present

## 2021-08-23 DIAGNOSIS — Z95811 Presence of heart assist device: Secondary | ICD-10-CM | POA: Diagnosis not present

## 2021-08-23 DIAGNOSIS — Z7901 Long term (current) use of anticoagulants: Secondary | ICD-10-CM | POA: Diagnosis not present

## 2021-08-25 DIAGNOSIS — I5022 Chronic systolic (congestive) heart failure: Secondary | ICD-10-CM | POA: Diagnosis not present

## 2021-09-06 DIAGNOSIS — J439 Emphysema, unspecified: Secondary | ICD-10-CM | POA: Diagnosis not present

## 2021-09-06 DIAGNOSIS — K056 Periodontal disease, unspecified: Secondary | ICD-10-CM | POA: Diagnosis not present

## 2021-09-06 DIAGNOSIS — N1831 Chronic kidney disease, stage 3a: Secondary | ICD-10-CM | POA: Diagnosis not present

## 2021-09-06 DIAGNOSIS — G459 Transient cerebral ischemic attack, unspecified: Secondary | ICD-10-CM | POA: Diagnosis not present

## 2021-09-06 DIAGNOSIS — K083 Retained dental root: Secondary | ICD-10-CM | POA: Diagnosis not present

## 2021-09-06 DIAGNOSIS — I5022 Chronic systolic (congestive) heart failure: Secondary | ICD-10-CM | POA: Diagnosis not present

## 2021-09-06 DIAGNOSIS — Z95810 Presence of automatic (implantable) cardiac defibrillator: Secondary | ICD-10-CM | POA: Diagnosis not present

## 2021-09-06 DIAGNOSIS — K05322 Chronic periodontitis, generalized, moderate: Secondary | ICD-10-CM | POA: Diagnosis not present

## 2021-09-06 DIAGNOSIS — K047 Periapical abscess without sinus: Secondary | ICD-10-CM | POA: Diagnosis not present

## 2021-09-06 DIAGNOSIS — K029 Dental caries, unspecified: Secondary | ICD-10-CM | POA: Diagnosis not present

## 2021-09-21 DIAGNOSIS — Z7901 Long term (current) use of anticoagulants: Secondary | ICD-10-CM | POA: Diagnosis not present

## 2021-09-21 DIAGNOSIS — Z95811 Presence of heart assist device: Secondary | ICD-10-CM | POA: Diagnosis not present

## 2021-10-03 DIAGNOSIS — Z95811 Presence of heart assist device: Secondary | ICD-10-CM | POA: Diagnosis not present

## 2021-10-26 DIAGNOSIS — Z95811 Presence of heart assist device: Secondary | ICD-10-CM | POA: Diagnosis not present

## 2021-10-26 DIAGNOSIS — Z7901 Long term (current) use of anticoagulants: Secondary | ICD-10-CM | POA: Diagnosis not present

## 2021-10-31 DIAGNOSIS — I440 Atrioventricular block, first degree: Secondary | ICD-10-CM | POA: Diagnosis not present

## 2021-10-31 DIAGNOSIS — I129 Hypertensive chronic kidney disease with stage 1 through stage 4 chronic kidney disease, or unspecified chronic kidney disease: Secondary | ICD-10-CM | POA: Diagnosis not present

## 2021-10-31 DIAGNOSIS — Z95811 Presence of heart assist device: Secondary | ICD-10-CM | POA: Diagnosis not present

## 2021-10-31 DIAGNOSIS — N183 Chronic kidney disease, stage 3 unspecified: Secondary | ICD-10-CM | POA: Diagnosis not present

## 2021-10-31 DIAGNOSIS — R9431 Abnormal electrocardiogram [ECG] [EKG]: Secondary | ICD-10-CM | POA: Diagnosis not present

## 2021-10-31 DIAGNOSIS — I5022 Chronic systolic (congestive) heart failure: Secondary | ICD-10-CM | POA: Diagnosis not present

## 2021-10-31 DIAGNOSIS — R140 Abdominal distension (gaseous): Secondary | ICD-10-CM | POA: Diagnosis not present

## 2021-10-31 DIAGNOSIS — R079 Chest pain, unspecified: Secondary | ICD-10-CM | POA: Diagnosis not present

## 2021-10-31 DIAGNOSIS — I428 Other cardiomyopathies: Secondary | ICD-10-CM | POA: Diagnosis not present

## 2021-10-31 DIAGNOSIS — R0789 Other chest pain: Secondary | ICD-10-CM | POA: Diagnosis not present

## 2021-10-31 DIAGNOSIS — R202 Paresthesia of skin: Secondary | ICD-10-CM | POA: Diagnosis not present

## 2021-10-31 DIAGNOSIS — Z7901 Long term (current) use of anticoagulants: Secondary | ICD-10-CM | POA: Diagnosis not present

## 2021-10-31 DIAGNOSIS — I447 Left bundle-branch block, unspecified: Secondary | ICD-10-CM | POA: Diagnosis not present

## 2021-10-31 DIAGNOSIS — R918 Other nonspecific abnormal finding of lung field: Secondary | ICD-10-CM | POA: Diagnosis not present

## 2021-11-15 DIAGNOSIS — I428 Other cardiomyopathies: Secondary | ICD-10-CM | POA: Diagnosis not present

## 2021-11-15 DIAGNOSIS — Z6837 Body mass index (BMI) 37.0-37.9, adult: Secondary | ICD-10-CM | POA: Diagnosis not present

## 2021-11-15 DIAGNOSIS — Z95811 Presence of heart assist device: Secondary | ICD-10-CM | POA: Diagnosis not present

## 2021-11-15 DIAGNOSIS — B952 Enterococcus as the cause of diseases classified elsewhere: Secondary | ICD-10-CM | POA: Diagnosis not present

## 2021-11-15 DIAGNOSIS — I519 Heart disease, unspecified: Secondary | ICD-10-CM | POA: Diagnosis not present

## 2021-11-15 DIAGNOSIS — I5022 Chronic systolic (congestive) heart failure: Secondary | ICD-10-CM | POA: Diagnosis not present

## 2021-11-15 DIAGNOSIS — H539 Unspecified visual disturbance: Secondary | ICD-10-CM | POA: Diagnosis not present

## 2021-11-15 DIAGNOSIS — Z7901 Long term (current) use of anticoagulants: Secondary | ICD-10-CM | POA: Diagnosis not present

## 2021-11-15 DIAGNOSIS — I10 Essential (primary) hypertension: Secondary | ICD-10-CM | POA: Diagnosis not present

## 2021-11-22 DIAGNOSIS — Z7901 Long term (current) use of anticoagulants: Secondary | ICD-10-CM | POA: Diagnosis not present

## 2021-11-22 DIAGNOSIS — Z95811 Presence of heart assist device: Secondary | ICD-10-CM | POA: Diagnosis not present

## 2021-12-08 DIAGNOSIS — Z95811 Presence of heart assist device: Secondary | ICD-10-CM | POA: Diagnosis not present

## 2021-12-08 DIAGNOSIS — Z7901 Long term (current) use of anticoagulants: Secondary | ICD-10-CM | POA: Diagnosis not present

## 2021-12-21 DIAGNOSIS — Z95811 Presence of heart assist device: Secondary | ICD-10-CM | POA: Diagnosis not present

## 2021-12-21 DIAGNOSIS — Z7901 Long term (current) use of anticoagulants: Secondary | ICD-10-CM | POA: Diagnosis not present

## 2022-01-12 DIAGNOSIS — H2513 Age-related nuclear cataract, bilateral: Secondary | ICD-10-CM | POA: Diagnosis not present

## 2022-01-15 DIAGNOSIS — Z95811 Presence of heart assist device: Secondary | ICD-10-CM | POA: Diagnosis not present

## 2022-01-19 DIAGNOSIS — Z7901 Long term (current) use of anticoagulants: Secondary | ICD-10-CM | POA: Diagnosis not present

## 2022-01-19 DIAGNOSIS — Z95811 Presence of heart assist device: Secondary | ICD-10-CM | POA: Diagnosis not present

## 2022-01-22 DIAGNOSIS — I071 Rheumatic tricuspid insufficiency: Secondary | ICD-10-CM | POA: Diagnosis not present

## 2022-01-22 DIAGNOSIS — I10 Essential (primary) hypertension: Secondary | ICD-10-CM | POA: Diagnosis not present

## 2022-01-22 DIAGNOSIS — I5022 Chronic systolic (congestive) heart failure: Secondary | ICD-10-CM | POA: Diagnosis not present

## 2022-01-22 DIAGNOSIS — B952 Enterococcus as the cause of diseases classified elsewhere: Secondary | ICD-10-CM | POA: Diagnosis not present

## 2022-01-22 DIAGNOSIS — J439 Emphysema, unspecified: Secondary | ICD-10-CM | POA: Diagnosis not present

## 2022-01-22 DIAGNOSIS — K047 Periapical abscess without sinus: Secondary | ICD-10-CM | POA: Diagnosis not present

## 2022-01-22 DIAGNOSIS — N189 Chronic kidney disease, unspecified: Secondary | ICD-10-CM | POA: Diagnosis not present

## 2022-01-22 DIAGNOSIS — I130 Hypertensive heart and chronic kidney disease with heart failure and stage 1 through stage 4 chronic kidney disease, or unspecified chronic kidney disease: Secondary | ICD-10-CM | POA: Diagnosis not present

## 2022-01-22 DIAGNOSIS — Z7901 Long term (current) use of anticoagulants: Secondary | ICD-10-CM | POA: Diagnosis not present

## 2022-01-22 DIAGNOSIS — E669 Obesity, unspecified: Secondary | ICD-10-CM | POA: Diagnosis not present

## 2022-01-22 DIAGNOSIS — K029 Dental caries, unspecified: Secondary | ICD-10-CM | POA: Diagnosis not present

## 2022-01-22 DIAGNOSIS — D631 Anemia in chronic kidney disease: Secondary | ICD-10-CM | POA: Diagnosis not present

## 2022-01-22 DIAGNOSIS — N183 Chronic kidney disease, stage 3 unspecified: Secondary | ICD-10-CM | POA: Diagnosis not present

## 2022-01-22 DIAGNOSIS — I509 Heart failure, unspecified: Secondary | ICD-10-CM | POA: Diagnosis not present

## 2022-01-22 DIAGNOSIS — E876 Hypokalemia: Secondary | ICD-10-CM | POA: Diagnosis not present

## 2022-01-22 DIAGNOSIS — Z95811 Presence of heart assist device: Secondary | ICD-10-CM | POA: Diagnosis not present

## 2022-01-22 DIAGNOSIS — I472 Ventricular tachycardia, unspecified: Secondary | ICD-10-CM | POA: Diagnosis not present

## 2022-01-22 DIAGNOSIS — Z01818 Encounter for other preprocedural examination: Secondary | ICD-10-CM | POA: Diagnosis not present

## 2022-01-26 DIAGNOSIS — Z79899 Other long term (current) drug therapy: Secondary | ICD-10-CM | POA: Diagnosis not present

## 2022-01-26 DIAGNOSIS — K029 Dental caries, unspecified: Secondary | ICD-10-CM | POA: Diagnosis not present

## 2022-01-26 DIAGNOSIS — Z23 Encounter for immunization: Secondary | ICD-10-CM | POA: Diagnosis not present

## 2022-01-26 DIAGNOSIS — K5900 Constipation, unspecified: Secondary | ICD-10-CM | POA: Diagnosis not present

## 2022-01-26 DIAGNOSIS — D631 Anemia in chronic kidney disease: Secondary | ICD-10-CM | POA: Diagnosis not present

## 2022-01-26 DIAGNOSIS — K047 Periapical abscess without sinus: Secondary | ICD-10-CM | POA: Diagnosis not present

## 2022-01-26 DIAGNOSIS — K05322 Chronic periodontitis, generalized, moderate: Secondary | ICD-10-CM | POA: Diagnosis not present

## 2022-01-26 DIAGNOSIS — Z95810 Presence of automatic (implantable) cardiac defibrillator: Secondary | ICD-10-CM | POA: Diagnosis not present

## 2022-01-26 DIAGNOSIS — K083 Retained dental root: Secondary | ICD-10-CM | POA: Diagnosis not present

## 2022-01-26 DIAGNOSIS — E785 Hyperlipidemia, unspecified: Secondary | ICD-10-CM | POA: Diagnosis not present

## 2022-01-26 DIAGNOSIS — R0902 Hypoxemia: Secondary | ICD-10-CM | POA: Diagnosis not present

## 2022-01-26 DIAGNOSIS — I5022 Chronic systolic (congestive) heart failure: Secondary | ICD-10-CM | POA: Diagnosis not present

## 2022-01-26 DIAGNOSIS — K056 Periodontal disease, unspecified: Secondary | ICD-10-CM | POA: Diagnosis not present

## 2022-01-26 DIAGNOSIS — Z95811 Presence of heart assist device: Secondary | ICD-10-CM | POA: Diagnosis not present

## 2022-01-26 DIAGNOSIS — I428 Other cardiomyopathies: Secondary | ICD-10-CM | POA: Diagnosis not present

## 2022-01-26 DIAGNOSIS — I071 Rheumatic tricuspid insufficiency: Secondary | ICD-10-CM | POA: Diagnosis not present

## 2022-01-26 DIAGNOSIS — G4700 Insomnia, unspecified: Secondary | ICD-10-CM | POA: Diagnosis not present

## 2022-01-26 DIAGNOSIS — Z6838 Body mass index (BMI) 38.0-38.9, adult: Secondary | ICD-10-CM | POA: Diagnosis not present

## 2022-01-26 DIAGNOSIS — Z7901 Long term (current) use of anticoagulants: Secondary | ICD-10-CM | POA: Diagnosis not present

## 2022-01-26 DIAGNOSIS — Z20822 Contact with and (suspected) exposure to covid-19: Secondary | ICD-10-CM | POA: Diagnosis not present

## 2022-01-26 DIAGNOSIS — J449 Chronic obstructive pulmonary disease, unspecified: Secondary | ICD-10-CM | POA: Diagnosis not present

## 2022-01-26 DIAGNOSIS — J811 Chronic pulmonary edema: Secondary | ICD-10-CM | POA: Diagnosis not present

## 2022-01-26 DIAGNOSIS — I472 Ventricular tachycardia, unspecified: Secondary | ICD-10-CM | POA: Diagnosis not present

## 2022-01-26 DIAGNOSIS — I130 Hypertensive heart and chronic kidney disease with heart failure and stage 1 through stage 4 chronic kidney disease, or unspecified chronic kidney disease: Secondary | ICD-10-CM | POA: Diagnosis not present

## 2022-01-26 DIAGNOSIS — N183 Chronic kidney disease, stage 3 unspecified: Secondary | ICD-10-CM | POA: Diagnosis not present

## 2022-01-26 DIAGNOSIS — Z8673 Personal history of transient ischemic attack (TIA), and cerebral infarction without residual deficits: Secondary | ICD-10-CM | POA: Diagnosis not present

## 2022-01-26 DIAGNOSIS — K219 Gastro-esophageal reflux disease without esophagitis: Secondary | ICD-10-CM | POA: Diagnosis not present

## 2022-01-27 DIAGNOSIS — R0902 Hypoxemia: Secondary | ICD-10-CM | POA: Diagnosis not present

## 2022-01-27 DIAGNOSIS — K047 Periapical abscess without sinus: Secondary | ICD-10-CM | POA: Diagnosis not present

## 2022-01-27 DIAGNOSIS — K5900 Constipation, unspecified: Secondary | ICD-10-CM | POA: Diagnosis not present

## 2022-01-27 DIAGNOSIS — D631 Anemia in chronic kidney disease: Secondary | ICD-10-CM | POA: Diagnosis not present

## 2022-01-27 DIAGNOSIS — K083 Retained dental root: Secondary | ICD-10-CM | POA: Diagnosis not present

## 2022-01-27 DIAGNOSIS — I5022 Chronic systolic (congestive) heart failure: Secondary | ICD-10-CM | POA: Diagnosis not present

## 2022-01-27 DIAGNOSIS — Z7901 Long term (current) use of anticoagulants: Secondary | ICD-10-CM | POA: Diagnosis not present

## 2022-01-27 DIAGNOSIS — Z6838 Body mass index (BMI) 38.0-38.9, adult: Secondary | ICD-10-CM | POA: Diagnosis not present

## 2022-01-27 DIAGNOSIS — E785 Hyperlipidemia, unspecified: Secondary | ICD-10-CM | POA: Diagnosis not present

## 2022-01-27 DIAGNOSIS — K056 Periodontal disease, unspecified: Secondary | ICD-10-CM | POA: Diagnosis not present

## 2022-01-27 DIAGNOSIS — I428 Other cardiomyopathies: Secondary | ICD-10-CM | POA: Diagnosis not present

## 2022-01-27 DIAGNOSIS — Z95811 Presence of heart assist device: Secondary | ICD-10-CM | POA: Diagnosis not present

## 2022-01-27 DIAGNOSIS — I071 Rheumatic tricuspid insufficiency: Secondary | ICD-10-CM | POA: Diagnosis not present

## 2022-01-27 DIAGNOSIS — N183 Chronic kidney disease, stage 3 unspecified: Secondary | ICD-10-CM | POA: Diagnosis not present

## 2022-01-27 DIAGNOSIS — G4700 Insomnia, unspecified: Secondary | ICD-10-CM | POA: Diagnosis not present

## 2022-01-27 DIAGNOSIS — J449 Chronic obstructive pulmonary disease, unspecified: Secondary | ICD-10-CM | POA: Diagnosis not present

## 2022-01-27 DIAGNOSIS — K029 Dental caries, unspecified: Secondary | ICD-10-CM | POA: Diagnosis not present

## 2022-01-27 DIAGNOSIS — Z8673 Personal history of transient ischemic attack (TIA), and cerebral infarction without residual deficits: Secondary | ICD-10-CM | POA: Diagnosis not present

## 2022-01-27 DIAGNOSIS — Z79899 Other long term (current) drug therapy: Secondary | ICD-10-CM | POA: Diagnosis not present

## 2022-01-27 DIAGNOSIS — Z95810 Presence of automatic (implantable) cardiac defibrillator: Secondary | ICD-10-CM | POA: Diagnosis not present

## 2022-01-27 DIAGNOSIS — I472 Ventricular tachycardia, unspecified: Secondary | ICD-10-CM | POA: Diagnosis not present

## 2022-01-27 DIAGNOSIS — I130 Hypertensive heart and chronic kidney disease with heart failure and stage 1 through stage 4 chronic kidney disease, or unspecified chronic kidney disease: Secondary | ICD-10-CM | POA: Diagnosis not present

## 2022-01-27 DIAGNOSIS — K219 Gastro-esophageal reflux disease without esophagitis: Secondary | ICD-10-CM | POA: Diagnosis not present

## 2022-01-27 DIAGNOSIS — Z23 Encounter for immunization: Secondary | ICD-10-CM | POA: Diagnosis not present

## 2022-01-27 DIAGNOSIS — Z20822 Contact with and (suspected) exposure to covid-19: Secondary | ICD-10-CM | POA: Diagnosis not present

## 2022-02-12 DIAGNOSIS — I130 Hypertensive heart and chronic kidney disease with heart failure and stage 1 through stage 4 chronic kidney disease, or unspecified chronic kidney disease: Secondary | ICD-10-CM | POA: Diagnosis not present

## 2022-02-12 DIAGNOSIS — G8929 Other chronic pain: Secondary | ICD-10-CM | POA: Diagnosis not present

## 2022-02-12 DIAGNOSIS — J449 Chronic obstructive pulmonary disease, unspecified: Secondary | ICD-10-CM | POA: Diagnosis not present

## 2022-02-12 DIAGNOSIS — M1990 Unspecified osteoarthritis, unspecified site: Secondary | ICD-10-CM | POA: Diagnosis not present

## 2022-02-12 DIAGNOSIS — I4891 Unspecified atrial fibrillation: Secondary | ICD-10-CM | POA: Diagnosis not present

## 2022-02-12 DIAGNOSIS — I509 Heart failure, unspecified: Secondary | ICD-10-CM | POA: Diagnosis not present

## 2022-02-12 DIAGNOSIS — K219 Gastro-esophageal reflux disease without esophagitis: Secondary | ICD-10-CM | POA: Diagnosis not present

## 2022-02-12 DIAGNOSIS — E785 Hyperlipidemia, unspecified: Secondary | ICD-10-CM | POA: Diagnosis not present

## 2022-02-12 DIAGNOSIS — K5900 Constipation, unspecified: Secondary | ICD-10-CM | POA: Diagnosis not present

## 2022-02-12 DIAGNOSIS — I739 Peripheral vascular disease, unspecified: Secondary | ICD-10-CM | POA: Diagnosis not present

## 2022-02-12 DIAGNOSIS — M109 Gout, unspecified: Secondary | ICD-10-CM | POA: Diagnosis not present

## 2022-02-13 DIAGNOSIS — N1831 Chronic kidney disease, stage 3a: Secondary | ICD-10-CM | POA: Diagnosis not present

## 2022-02-13 DIAGNOSIS — B952 Enterococcus as the cause of diseases classified elsewhere: Secondary | ICD-10-CM | POA: Diagnosis not present

## 2022-02-13 DIAGNOSIS — Z95811 Presence of heart assist device: Secondary | ICD-10-CM | POA: Diagnosis not present

## 2022-02-13 DIAGNOSIS — Z9189 Other specified personal risk factors, not elsewhere classified: Secondary | ICD-10-CM | POA: Diagnosis not present

## 2022-02-13 DIAGNOSIS — I4729 Other ventricular tachycardia: Secondary | ICD-10-CM | POA: Diagnosis not present

## 2022-02-13 DIAGNOSIS — E785 Hyperlipidemia, unspecified: Secondary | ICD-10-CM | POA: Diagnosis not present

## 2022-02-13 DIAGNOSIS — Z95810 Presence of automatic (implantable) cardiac defibrillator: Secondary | ICD-10-CM | POA: Diagnosis not present

## 2022-02-13 DIAGNOSIS — Z79899 Other long term (current) drug therapy: Secondary | ICD-10-CM | POA: Diagnosis not present

## 2022-02-13 DIAGNOSIS — I130 Hypertensive heart and chronic kidney disease with heart failure and stage 1 through stage 4 chronic kidney disease, or unspecified chronic kidney disease: Secondary | ICD-10-CM | POA: Diagnosis not present

## 2022-02-13 DIAGNOSIS — I10 Essential (primary) hypertension: Secondary | ICD-10-CM | POA: Diagnosis not present

## 2022-02-13 DIAGNOSIS — K08409 Partial loss of teeth, unspecified cause, unspecified class: Secondary | ICD-10-CM | POA: Diagnosis not present

## 2022-02-13 DIAGNOSIS — Z7901 Long term (current) use of anticoagulants: Secondary | ICD-10-CM | POA: Diagnosis not present

## 2022-02-13 DIAGNOSIS — I5022 Chronic systolic (congestive) heart failure: Secondary | ICD-10-CM | POA: Diagnosis not present

## 2022-02-13 DIAGNOSIS — I428 Other cardiomyopathies: Secondary | ICD-10-CM | POA: Diagnosis not present

## 2022-02-16 DIAGNOSIS — I130 Hypertensive heart and chronic kidney disease with heart failure and stage 1 through stage 4 chronic kidney disease, or unspecified chronic kidney disease: Secondary | ICD-10-CM | POA: Diagnosis not present

## 2022-02-16 DIAGNOSIS — I495 Sick sinus syndrome: Secondary | ICD-10-CM | POA: Diagnosis not present

## 2022-02-16 DIAGNOSIS — I5022 Chronic systolic (congestive) heart failure: Secondary | ICD-10-CM | POA: Diagnosis not present

## 2022-02-16 DIAGNOSIS — Z95811 Presence of heart assist device: Secondary | ICD-10-CM | POA: Diagnosis not present

## 2022-02-16 DIAGNOSIS — N1831 Chronic kidney disease, stage 3a: Secondary | ICD-10-CM | POA: Diagnosis not present

## 2022-02-16 DIAGNOSIS — Z0000 Encounter for general adult medical examination without abnormal findings: Secondary | ICD-10-CM | POA: Diagnosis not present

## 2022-02-16 DIAGNOSIS — Z7901 Long term (current) use of anticoagulants: Secondary | ICD-10-CM | POA: Diagnosis not present

## 2022-02-16 DIAGNOSIS — I428 Other cardiomyopathies: Secondary | ICD-10-CM | POA: Diagnosis not present

## 2022-02-16 DIAGNOSIS — M1009 Idiopathic gout, multiple sites: Secondary | ICD-10-CM | POA: Diagnosis not present

## 2022-02-16 DIAGNOSIS — R7302 Impaired glucose tolerance (oral): Secondary | ICD-10-CM | POA: Diagnosis not present

## 2022-02-16 DIAGNOSIS — K219 Gastro-esophageal reflux disease without esophagitis: Secondary | ICD-10-CM | POA: Diagnosis not present

## 2022-02-20 DIAGNOSIS — K08409 Partial loss of teeth, unspecified cause, unspecified class: Secondary | ICD-10-CM | POA: Diagnosis not present

## 2022-03-01 DIAGNOSIS — Z95811 Presence of heart assist device: Secondary | ICD-10-CM | POA: Diagnosis not present

## 2022-03-08 DIAGNOSIS — H938X1 Other specified disorders of right ear: Secondary | ICD-10-CM | POA: Diagnosis not present

## 2022-03-08 DIAGNOSIS — J449 Chronic obstructive pulmonary disease, unspecified: Secondary | ICD-10-CM | POA: Diagnosis not present

## 2022-03-08 DIAGNOSIS — H9313 Tinnitus, bilateral: Secondary | ICD-10-CM | POA: Diagnosis not present

## 2022-03-08 DIAGNOSIS — Z8673 Personal history of transient ischemic attack (TIA), and cerebral infarction without residual deficits: Secondary | ICD-10-CM | POA: Diagnosis not present

## 2022-03-08 DIAGNOSIS — I509 Heart failure, unspecified: Secondary | ICD-10-CM | POA: Diagnosis not present

## 2022-03-08 DIAGNOSIS — Z0110 Encounter for examination of ears and hearing without abnormal findings: Secondary | ICD-10-CM | POA: Diagnosis not present

## 2022-03-08 DIAGNOSIS — H6123 Impacted cerumen, bilateral: Secondary | ICD-10-CM | POA: Diagnosis not present

## 2022-03-08 DIAGNOSIS — N189 Chronic kidney disease, unspecified: Secondary | ICD-10-CM | POA: Diagnosis not present

## 2022-03-08 DIAGNOSIS — I130 Hypertensive heart and chronic kidney disease with heart failure and stage 1 through stage 4 chronic kidney disease, or unspecified chronic kidney disease: Secondary | ICD-10-CM | POA: Diagnosis not present

## 2022-03-08 DIAGNOSIS — Z95810 Presence of automatic (implantable) cardiac defibrillator: Secondary | ICD-10-CM | POA: Diagnosis not present

## 2022-03-15 DIAGNOSIS — Z95811 Presence of heart assist device: Secondary | ICD-10-CM | POA: Diagnosis not present

## 2022-03-15 DIAGNOSIS — Z7901 Long term (current) use of anticoagulants: Secondary | ICD-10-CM | POA: Diagnosis not present

## 2022-03-28 DIAGNOSIS — K05219 Aggressive periodontitis, localized, unspecified severity: Secondary | ICD-10-CM | POA: Diagnosis not present

## 2022-03-28 DIAGNOSIS — M278 Other specified diseases of jaws: Secondary | ICD-10-CM | POA: Diagnosis not present

## 2022-03-30 DIAGNOSIS — Z95811 Presence of heart assist device: Secondary | ICD-10-CM | POA: Diagnosis not present

## 2022-04-12 DIAGNOSIS — Z95811 Presence of heart assist device: Secondary | ICD-10-CM | POA: Diagnosis not present

## 2022-04-12 DIAGNOSIS — Z7901 Long term (current) use of anticoagulants: Secondary | ICD-10-CM | POA: Diagnosis not present

## 2022-04-18 DIAGNOSIS — M278 Other specified diseases of jaws: Secondary | ICD-10-CM | POA: Diagnosis not present

## 2022-04-27 DIAGNOSIS — Z7901 Long term (current) use of anticoagulants: Secondary | ICD-10-CM | POA: Diagnosis not present

## 2022-04-27 DIAGNOSIS — Z95811 Presence of heart assist device: Secondary | ICD-10-CM | POA: Diagnosis not present

## 2022-05-11 DIAGNOSIS — Z95811 Presence of heart assist device: Secondary | ICD-10-CM | POA: Diagnosis not present

## 2022-05-11 DIAGNOSIS — Z7901 Long term (current) use of anticoagulants: Secondary | ICD-10-CM | POA: Diagnosis not present

## 2022-05-14 DIAGNOSIS — Z6837 Body mass index (BMI) 37.0-37.9, adult: Secondary | ICD-10-CM | POA: Diagnosis not present

## 2022-05-14 DIAGNOSIS — Z95810 Presence of automatic (implantable) cardiac defibrillator: Secondary | ICD-10-CM | POA: Diagnosis not present

## 2022-05-14 DIAGNOSIS — I10 Essential (primary) hypertension: Secondary | ICD-10-CM | POA: Diagnosis not present

## 2022-05-14 DIAGNOSIS — B952 Enterococcus as the cause of diseases classified elsewhere: Secondary | ICD-10-CM | POA: Diagnosis not present

## 2022-05-14 DIAGNOSIS — I5022 Chronic systolic (congestive) heart failure: Secondary | ICD-10-CM | POA: Diagnosis not present

## 2022-05-14 DIAGNOSIS — Z95811 Presence of heart assist device: Secondary | ICD-10-CM | POA: Diagnosis not present

## 2022-05-14 DIAGNOSIS — N1831 Chronic kidney disease, stage 3a: Secondary | ICD-10-CM | POA: Diagnosis not present

## 2022-05-14 DIAGNOSIS — I4729 Other ventricular tachycardia: Secondary | ICD-10-CM | POA: Diagnosis not present

## 2022-05-14 DIAGNOSIS — Z7901 Long term (current) use of anticoagulants: Secondary | ICD-10-CM | POA: Diagnosis not present

## 2022-05-14 DIAGNOSIS — I428 Other cardiomyopathies: Secondary | ICD-10-CM | POA: Diagnosis not present

## 2022-05-14 DIAGNOSIS — Z79899 Other long term (current) drug therapy: Secondary | ICD-10-CM | POA: Diagnosis not present

## 2022-05-14 DIAGNOSIS — Z9189 Other specified personal risk factors, not elsewhere classified: Secondary | ICD-10-CM | POA: Diagnosis not present

## 2022-05-29 DIAGNOSIS — Z95811 Presence of heart assist device: Secondary | ICD-10-CM | POA: Diagnosis not present

## 2022-06-06 DIAGNOSIS — M278 Other specified diseases of jaws: Secondary | ICD-10-CM | POA: Diagnosis not present

## 2022-06-14 DIAGNOSIS — Z95811 Presence of heart assist device: Secondary | ICD-10-CM | POA: Diagnosis not present

## 2022-06-14 DIAGNOSIS — Z7901 Long term (current) use of anticoagulants: Secondary | ICD-10-CM | POA: Diagnosis not present

## 2022-06-20 DIAGNOSIS — M278 Other specified diseases of jaws: Secondary | ICD-10-CM | POA: Diagnosis not present

## 2022-06-22 DIAGNOSIS — I428 Other cardiomyopathies: Secondary | ICD-10-CM | POA: Diagnosis not present

## 2022-06-22 DIAGNOSIS — Z95811 Presence of heart assist device: Secondary | ICD-10-CM | POA: Diagnosis not present

## 2022-06-25 DIAGNOSIS — Z95811 Presence of heart assist device: Secondary | ICD-10-CM | POA: Diagnosis not present

## 2022-07-05 ENCOUNTER — Telehealth: Payer: Self-pay

## 2022-07-05 NOTE — Telephone Encounter (Signed)
-  The patient states he is now being followed by [facility]. Dafna Romo

## 2022-07-19 DIAGNOSIS — Z7901 Long term (current) use of anticoagulants: Secondary | ICD-10-CM | POA: Diagnosis not present

## 2022-07-19 DIAGNOSIS — Z95811 Presence of heart assist device: Secondary | ICD-10-CM | POA: Diagnosis not present

## 2022-07-25 DIAGNOSIS — Z95811 Presence of heart assist device: Secondary | ICD-10-CM | POA: Diagnosis not present

## 2022-08-13 DIAGNOSIS — Z4509 Encounter for adjustment and management of other cardiac device: Secondary | ICD-10-CM | POA: Diagnosis not present

## 2022-08-13 DIAGNOSIS — I428 Other cardiomyopathies: Secondary | ICD-10-CM | POA: Diagnosis not present

## 2022-08-13 DIAGNOSIS — Z79899 Other long term (current) drug therapy: Secondary | ICD-10-CM | POA: Diagnosis not present

## 2022-08-13 DIAGNOSIS — E669 Obesity, unspecified: Secondary | ICD-10-CM | POA: Diagnosis not present

## 2022-08-13 DIAGNOSIS — Z95811 Presence of heart assist device: Secondary | ICD-10-CM | POA: Diagnosis not present

## 2022-08-13 DIAGNOSIS — Z6836 Body mass index (BMI) 36.0-36.9, adult: Secondary | ICD-10-CM | POA: Diagnosis not present

## 2022-08-13 DIAGNOSIS — I5084 End stage heart failure: Secondary | ICD-10-CM | POA: Diagnosis not present

## 2022-08-13 DIAGNOSIS — R9431 Abnormal electrocardiogram [ECG] [EKG]: Secondary | ICD-10-CM | POA: Diagnosis not present

## 2022-08-13 DIAGNOSIS — Z7901 Long term (current) use of anticoagulants: Secondary | ICD-10-CM | POA: Diagnosis not present

## 2022-08-13 DIAGNOSIS — I10 Essential (primary) hypertension: Secondary | ICD-10-CM | POA: Diagnosis not present

## 2022-08-13 DIAGNOSIS — Z6837 Body mass index (BMI) 37.0-37.9, adult: Secondary | ICD-10-CM | POA: Diagnosis not present

## 2022-08-13 DIAGNOSIS — Z8673 Personal history of transient ischemic attack (TIA), and cerebral infarction without residual deficits: Secondary | ICD-10-CM | POA: Diagnosis not present

## 2022-08-13 DIAGNOSIS — I5022 Chronic systolic (congestive) heart failure: Secondary | ICD-10-CM | POA: Diagnosis not present

## 2022-08-13 DIAGNOSIS — Z9189 Other specified personal risk factors, not elsewhere classified: Secondary | ICD-10-CM | POA: Diagnosis not present

## 2022-08-13 DIAGNOSIS — Z95810 Presence of automatic (implantable) cardiac defibrillator: Secondary | ICD-10-CM | POA: Diagnosis not present

## 2022-08-13 DIAGNOSIS — I110 Hypertensive heart disease with heart failure: Secondary | ICD-10-CM | POA: Diagnosis not present

## 2022-08-17 DIAGNOSIS — J449 Chronic obstructive pulmonary disease, unspecified: Secondary | ICD-10-CM | POA: Diagnosis not present

## 2022-08-17 DIAGNOSIS — N1831 Chronic kidney disease, stage 3a: Secondary | ICD-10-CM | POA: Diagnosis not present

## 2022-08-17 DIAGNOSIS — R7302 Impaired glucose tolerance (oral): Secondary | ICD-10-CM | POA: Diagnosis not present

## 2022-08-17 DIAGNOSIS — Z95811 Presence of heart assist device: Secondary | ICD-10-CM | POA: Diagnosis not present

## 2022-08-17 DIAGNOSIS — I130 Hypertensive heart and chronic kidney disease with heart failure and stage 1 through stage 4 chronic kidney disease, or unspecified chronic kidney disease: Secondary | ICD-10-CM | POA: Diagnosis not present

## 2022-08-17 DIAGNOSIS — N400 Enlarged prostate without lower urinary tract symptoms: Secondary | ICD-10-CM | POA: Diagnosis not present

## 2022-08-17 DIAGNOSIS — K219 Gastro-esophageal reflux disease without esophagitis: Secondary | ICD-10-CM | POA: Diagnosis not present

## 2022-08-17 DIAGNOSIS — I495 Sick sinus syndrome: Secondary | ICD-10-CM | POA: Diagnosis not present

## 2022-08-17 DIAGNOSIS — E785 Hyperlipidemia, unspecified: Secondary | ICD-10-CM | POA: Diagnosis not present

## 2022-08-17 DIAGNOSIS — I5022 Chronic systolic (congestive) heart failure: Secondary | ICD-10-CM | POA: Diagnosis not present

## 2022-08-17 DIAGNOSIS — I428 Other cardiomyopathies: Secondary | ICD-10-CM | POA: Diagnosis not present

## 2022-08-17 DIAGNOSIS — M1009 Idiopathic gout, multiple sites: Secondary | ICD-10-CM | POA: Diagnosis not present

## 2022-08-23 DIAGNOSIS — Z7901 Long term (current) use of anticoagulants: Secondary | ICD-10-CM | POA: Diagnosis not present

## 2022-08-23 DIAGNOSIS — Z95811 Presence of heart assist device: Secondary | ICD-10-CM | POA: Diagnosis not present

## 2022-08-28 DIAGNOSIS — Z95811 Presence of heart assist device: Secondary | ICD-10-CM | POA: Diagnosis not present

## 2022-08-28 DIAGNOSIS — T829XXA Unspecified complication of cardiac and vascular prosthetic device, implant and graft, initial encounter: Secondary | ICD-10-CM | POA: Diagnosis not present

## 2022-08-31 DIAGNOSIS — Z95811 Presence of heart assist device: Secondary | ICD-10-CM | POA: Diagnosis not present

## 2022-08-31 DIAGNOSIS — Z7901 Long term (current) use of anticoagulants: Secondary | ICD-10-CM | POA: Diagnosis not present

## 2022-09-20 DIAGNOSIS — Z95811 Presence of heart assist device: Secondary | ICD-10-CM | POA: Diagnosis not present

## 2022-09-20 DIAGNOSIS — Z7901 Long term (current) use of anticoagulants: Secondary | ICD-10-CM | POA: Diagnosis not present

## 2022-10-18 DIAGNOSIS — Z95811 Presence of heart assist device: Secondary | ICD-10-CM | POA: Diagnosis not present

## 2022-10-18 DIAGNOSIS — K439 Ventral hernia without obstruction or gangrene: Secondary | ICD-10-CM | POA: Diagnosis not present

## 2022-10-18 DIAGNOSIS — Z7901 Long term (current) use of anticoagulants: Secondary | ICD-10-CM | POA: Diagnosis not present

## 2022-10-19 DIAGNOSIS — Z7901 Long term (current) use of anticoagulants: Secondary | ICD-10-CM | POA: Diagnosis not present

## 2022-10-19 DIAGNOSIS — Z95811 Presence of heart assist device: Secondary | ICD-10-CM | POA: Diagnosis not present

## 2022-11-06 DIAGNOSIS — Z95810 Presence of automatic (implantable) cardiac defibrillator: Secondary | ICD-10-CM | POA: Diagnosis not present

## 2022-11-08 DIAGNOSIS — Z95811 Presence of heart assist device: Secondary | ICD-10-CM | POA: Diagnosis not present

## 2022-11-16 DIAGNOSIS — Z7901 Long term (current) use of anticoagulants: Secondary | ICD-10-CM | POA: Diagnosis not present

## 2022-11-16 DIAGNOSIS — Z95811 Presence of heart assist device: Secondary | ICD-10-CM | POA: Diagnosis not present

## 2022-11-21 DIAGNOSIS — E291 Testicular hypofunction: Secondary | ICD-10-CM | POA: Diagnosis not present

## 2022-11-21 DIAGNOSIS — Z125 Encounter for screening for malignant neoplasm of prostate: Secondary | ICD-10-CM | POA: Diagnosis not present

## 2022-11-27 DIAGNOSIS — I10 Essential (primary) hypertension: Secondary | ICD-10-CM | POA: Diagnosis not present

## 2022-11-27 DIAGNOSIS — N1831 Chronic kidney disease, stage 3a: Secondary | ICD-10-CM | POA: Diagnosis not present

## 2022-11-27 DIAGNOSIS — A498 Other bacterial infections of unspecified site: Secondary | ICD-10-CM | POA: Diagnosis not present

## 2022-11-27 DIAGNOSIS — I4729 Other ventricular tachycardia: Secondary | ICD-10-CM | POA: Diagnosis not present

## 2022-11-27 DIAGNOSIS — I428 Other cardiomyopathies: Secondary | ICD-10-CM | POA: Diagnosis not present

## 2022-11-27 DIAGNOSIS — I5022 Chronic systolic (congestive) heart failure: Secondary | ICD-10-CM | POA: Diagnosis not present

## 2022-11-27 DIAGNOSIS — Z95811 Presence of heart assist device: Secondary | ICD-10-CM | POA: Diagnosis not present

## 2022-11-27 DIAGNOSIS — Z79899 Other long term (current) drug therapy: Secondary | ICD-10-CM | POA: Diagnosis not present

## 2022-11-27 DIAGNOSIS — Z9189 Other specified personal risk factors, not elsewhere classified: Secondary | ICD-10-CM | POA: Diagnosis not present

## 2022-11-27 DIAGNOSIS — Z95810 Presence of automatic (implantable) cardiac defibrillator: Secondary | ICD-10-CM | POA: Diagnosis not present

## 2022-11-27 DIAGNOSIS — Z7901 Long term (current) use of anticoagulants: Secondary | ICD-10-CM | POA: Diagnosis not present

## 2022-12-13 DIAGNOSIS — Z7901 Long term (current) use of anticoagulants: Secondary | ICD-10-CM | POA: Diagnosis not present

## 2022-12-13 DIAGNOSIS — I5022 Chronic systolic (congestive) heart failure: Secondary | ICD-10-CM | POA: Diagnosis not present

## 2022-12-14 DIAGNOSIS — Z7901 Long term (current) use of anticoagulants: Secondary | ICD-10-CM | POA: Diagnosis not present

## 2022-12-14 DIAGNOSIS — Z95811 Presence of heart assist device: Secondary | ICD-10-CM | POA: Diagnosis not present

## 2022-12-19 DIAGNOSIS — I5022 Chronic systolic (congestive) heart failure: Secondary | ICD-10-CM | POA: Diagnosis not present

## 2023-01-11 DIAGNOSIS — Z95811 Presence of heart assist device: Secondary | ICD-10-CM | POA: Diagnosis not present

## 2023-01-11 DIAGNOSIS — Z7901 Long term (current) use of anticoagulants: Secondary | ICD-10-CM | POA: Diagnosis not present

## 2023-01-17 DIAGNOSIS — H02823 Cysts of right eye, unspecified eyelid: Secondary | ICD-10-CM | POA: Diagnosis not present

## 2023-01-17 DIAGNOSIS — H2513 Age-related nuclear cataract, bilateral: Secondary | ICD-10-CM | POA: Diagnosis not present

## 2023-01-28 DIAGNOSIS — Z95811 Presence of heart assist device: Secondary | ICD-10-CM | POA: Diagnosis not present

## 2023-02-07 DIAGNOSIS — Z95811 Presence of heart assist device: Secondary | ICD-10-CM | POA: Diagnosis not present

## 2023-02-07 DIAGNOSIS — Z7901 Long term (current) use of anticoagulants: Secondary | ICD-10-CM | POA: Diagnosis not present

## 2023-02-18 DIAGNOSIS — I5022 Chronic systolic (congestive) heart failure: Secondary | ICD-10-CM | POA: Diagnosis not present

## 2023-02-18 DIAGNOSIS — N1831 Chronic kidney disease, stage 3a: Secondary | ICD-10-CM | POA: Diagnosis not present

## 2023-02-18 DIAGNOSIS — K219 Gastro-esophageal reflux disease without esophagitis: Secondary | ICD-10-CM | POA: Diagnosis not present

## 2023-02-18 DIAGNOSIS — R7302 Impaired glucose tolerance (oral): Secondary | ICD-10-CM | POA: Diagnosis not present

## 2023-02-18 DIAGNOSIS — J449 Chronic obstructive pulmonary disease, unspecified: Secondary | ICD-10-CM | POA: Diagnosis not present

## 2023-02-18 DIAGNOSIS — Z0000 Encounter for general adult medical examination without abnormal findings: Secondary | ICD-10-CM | POA: Diagnosis not present

## 2023-02-18 DIAGNOSIS — I428 Other cardiomyopathies: Secondary | ICD-10-CM | POA: Diagnosis not present

## 2023-02-18 DIAGNOSIS — I495 Sick sinus syndrome: Secondary | ICD-10-CM | POA: Diagnosis not present

## 2023-02-18 DIAGNOSIS — Z95811 Presence of heart assist device: Secondary | ICD-10-CM | POA: Diagnosis not present

## 2023-02-18 DIAGNOSIS — E785 Hyperlipidemia, unspecified: Secondary | ICD-10-CM | POA: Diagnosis not present

## 2023-02-18 DIAGNOSIS — M1009 Idiopathic gout, multiple sites: Secondary | ICD-10-CM | POA: Diagnosis not present

## 2023-02-18 DIAGNOSIS — I130 Hypertensive heart and chronic kidney disease with heart failure and stage 1 through stage 4 chronic kidney disease, or unspecified chronic kidney disease: Secondary | ICD-10-CM | POA: Diagnosis not present

## 2023-02-20 DIAGNOSIS — Z7901 Long term (current) use of anticoagulants: Secondary | ICD-10-CM | POA: Diagnosis not present

## 2023-02-20 DIAGNOSIS — Z95810 Presence of automatic (implantable) cardiac defibrillator: Secondary | ICD-10-CM | POA: Diagnosis not present

## 2023-02-20 DIAGNOSIS — J984 Other disorders of lung: Secondary | ICD-10-CM | POA: Diagnosis not present

## 2023-02-20 DIAGNOSIS — I428 Other cardiomyopathies: Secondary | ICD-10-CM | POA: Diagnosis not present

## 2023-02-20 DIAGNOSIS — I5084 End stage heart failure: Secondary | ICD-10-CM | POA: Diagnosis not present

## 2023-02-20 DIAGNOSIS — Z4509 Encounter for adjustment and management of other cardiac device: Secondary | ICD-10-CM | POA: Diagnosis not present

## 2023-02-20 DIAGNOSIS — I110 Hypertensive heart disease with heart failure: Secondary | ICD-10-CM | POA: Diagnosis not present

## 2023-02-20 DIAGNOSIS — Z6837 Body mass index (BMI) 37.0-37.9, adult: Secondary | ICD-10-CM | POA: Diagnosis not present

## 2023-02-20 DIAGNOSIS — Z95811 Presence of heart assist device: Secondary | ICD-10-CM | POA: Diagnosis not present

## 2023-02-20 DIAGNOSIS — I10 Essential (primary) hypertension: Secondary | ICD-10-CM | POA: Diagnosis not present

## 2023-02-20 DIAGNOSIS — Z23 Encounter for immunization: Secondary | ICD-10-CM | POA: Diagnosis not present

## 2023-02-20 DIAGNOSIS — I5022 Chronic systolic (congestive) heart failure: Secondary | ICD-10-CM | POA: Diagnosis not present

## 2023-02-20 DIAGNOSIS — Z79899 Other long term (current) drug therapy: Secondary | ICD-10-CM | POA: Diagnosis not present

## 2023-02-20 DIAGNOSIS — Z8679 Personal history of other diseases of the circulatory system: Secondary | ICD-10-CM | POA: Diagnosis not present

## 2023-02-20 DIAGNOSIS — R9431 Abnormal electrocardiogram [ECG] [EKG]: Secondary | ICD-10-CM | POA: Diagnosis not present

## 2023-02-25 DIAGNOSIS — Z95811 Presence of heart assist device: Secondary | ICD-10-CM | POA: Diagnosis not present

## 2023-02-27 DIAGNOSIS — I5022 Chronic systolic (congestive) heart failure: Secondary | ICD-10-CM | POA: Diagnosis not present

## 2023-03-06 DIAGNOSIS — I5022 Chronic systolic (congestive) heart failure: Secondary | ICD-10-CM | POA: Diagnosis not present

## 2023-03-06 DIAGNOSIS — Z95811 Presence of heart assist device: Secondary | ICD-10-CM | POA: Diagnosis not present

## 2023-03-07 DIAGNOSIS — Z7901 Long term (current) use of anticoagulants: Secondary | ICD-10-CM | POA: Diagnosis not present

## 2023-03-07 DIAGNOSIS — Z95811 Presence of heart assist device: Secondary | ICD-10-CM | POA: Diagnosis not present

## 2023-03-12 DIAGNOSIS — I5022 Chronic systolic (congestive) heart failure: Secondary | ICD-10-CM | POA: Diagnosis not present

## 2023-03-12 DIAGNOSIS — Z95811 Presence of heart assist device: Secondary | ICD-10-CM | POA: Diagnosis not present

## 2023-04-04 DIAGNOSIS — Z95811 Presence of heart assist device: Secondary | ICD-10-CM | POA: Diagnosis not present

## 2023-04-04 DIAGNOSIS — Z7901 Long term (current) use of anticoagulants: Secondary | ICD-10-CM | POA: Diagnosis not present

## 2023-04-09 DIAGNOSIS — M25561 Pain in right knee: Secondary | ICD-10-CM | POA: Diagnosis not present

## 2023-04-09 DIAGNOSIS — J439 Emphysema, unspecified: Secondary | ICD-10-CM | POA: Diagnosis not present

## 2023-04-09 DIAGNOSIS — M25462 Effusion, left knee: Secondary | ICD-10-CM | POA: Diagnosis not present

## 2023-04-09 DIAGNOSIS — Z6837 Body mass index (BMI) 37.0-37.9, adult: Secondary | ICD-10-CM | POA: Diagnosis not present

## 2023-04-09 DIAGNOSIS — N1831 Chronic kidney disease, stage 3a: Secondary | ICD-10-CM | POA: Diagnosis not present

## 2023-04-09 DIAGNOSIS — M25562 Pain in left knee: Secondary | ICD-10-CM | POA: Diagnosis not present

## 2023-04-09 DIAGNOSIS — M170 Bilateral primary osteoarthritis of knee: Secondary | ICD-10-CM | POA: Diagnosis not present

## 2023-04-09 DIAGNOSIS — M25762 Osteophyte, left knee: Secondary | ICD-10-CM | POA: Diagnosis not present

## 2023-04-09 DIAGNOSIS — G8929 Other chronic pain: Secondary | ICD-10-CM | POA: Diagnosis not present

## 2023-04-09 DIAGNOSIS — M25761 Osteophyte, right knee: Secondary | ICD-10-CM | POA: Diagnosis not present

## 2023-04-09 DIAGNOSIS — Z95811 Presence of heart assist device: Secondary | ICD-10-CM | POA: Diagnosis not present

## 2023-04-09 DIAGNOSIS — M25461 Effusion, right knee: Secondary | ICD-10-CM | POA: Diagnosis not present

## 2023-04-18 DIAGNOSIS — Z95811 Presence of heart assist device: Secondary | ICD-10-CM | POA: Diagnosis not present

## 2023-04-23 DIAGNOSIS — Z95811 Presence of heart assist device: Secondary | ICD-10-CM | POA: Diagnosis not present

## 2023-04-26 DIAGNOSIS — Z95810 Presence of automatic (implantable) cardiac defibrillator: Secondary | ICD-10-CM | POA: Diagnosis not present

## 2023-04-26 DIAGNOSIS — Z95811 Presence of heart assist device: Secondary | ICD-10-CM | POA: Diagnosis not present

## 2023-04-26 DIAGNOSIS — I5022 Chronic systolic (congestive) heart failure: Secondary | ICD-10-CM | POA: Diagnosis not present

## 2023-04-26 DIAGNOSIS — J439 Emphysema, unspecified: Secondary | ICD-10-CM | POA: Diagnosis not present

## 2023-04-26 DIAGNOSIS — M170 Bilateral primary osteoarthritis of knee: Secondary | ICD-10-CM | POA: Diagnosis not present

## 2023-04-26 DIAGNOSIS — N1831 Chronic kidney disease, stage 3a: Secondary | ICD-10-CM | POA: Diagnosis not present

## 2023-05-13 DIAGNOSIS — Z87891 Personal history of nicotine dependence: Secondary | ICD-10-CM | POA: Diagnosis not present

## 2023-05-13 DIAGNOSIS — N1831 Chronic kidney disease, stage 3a: Secondary | ICD-10-CM | POA: Diagnosis not present

## 2023-05-13 DIAGNOSIS — I10 Essential (primary) hypertension: Secondary | ICD-10-CM | POA: Diagnosis not present

## 2023-05-13 DIAGNOSIS — Z79899 Other long term (current) drug therapy: Secondary | ICD-10-CM | POA: Diagnosis not present

## 2023-05-13 DIAGNOSIS — Z95811 Presence of heart assist device: Secondary | ICD-10-CM | POA: Diagnosis not present

## 2023-05-13 DIAGNOSIS — I519 Heart disease, unspecified: Secondary | ICD-10-CM | POA: Diagnosis not present

## 2023-05-13 DIAGNOSIS — Z6838 Body mass index (BMI) 38.0-38.9, adult: Secondary | ICD-10-CM | POA: Diagnosis not present

## 2023-05-13 DIAGNOSIS — Z7901 Long term (current) use of anticoagulants: Secondary | ICD-10-CM | POA: Diagnosis not present

## 2023-05-13 DIAGNOSIS — I5022 Chronic systolic (congestive) heart failure: Secondary | ICD-10-CM | POA: Diagnosis not present

## 2023-05-13 DIAGNOSIS — Z9189 Other specified personal risk factors, not elsewhere classified: Secondary | ICD-10-CM | POA: Diagnosis not present

## 2023-05-13 DIAGNOSIS — I130 Hypertensive heart and chronic kidney disease with heart failure and stage 1 through stage 4 chronic kidney disease, or unspecified chronic kidney disease: Secondary | ICD-10-CM | POA: Diagnosis not present

## 2023-05-13 DIAGNOSIS — I428 Other cardiomyopathies: Secondary | ICD-10-CM | POA: Diagnosis not present

## 2023-05-16 DIAGNOSIS — Z95811 Presence of heart assist device: Secondary | ICD-10-CM | POA: Diagnosis not present

## 2023-05-16 DIAGNOSIS — Z7901 Long term (current) use of anticoagulants: Secondary | ICD-10-CM | POA: Diagnosis not present

## 2023-05-28 DIAGNOSIS — Z95811 Presence of heart assist device: Secondary | ICD-10-CM | POA: Diagnosis not present

## 2023-05-30 DIAGNOSIS — Z95811 Presence of heart assist device: Secondary | ICD-10-CM | POA: Diagnosis not present

## 2023-06-11 DIAGNOSIS — I5022 Chronic systolic (congestive) heart failure: Secondary | ICD-10-CM | POA: Diagnosis not present

## 2023-06-14 DIAGNOSIS — Z7901 Long term (current) use of anticoagulants: Secondary | ICD-10-CM | POA: Diagnosis not present

## 2023-06-14 DIAGNOSIS — Z95811 Presence of heart assist device: Secondary | ICD-10-CM | POA: Diagnosis not present

## 2023-07-11 DIAGNOSIS — Z7901 Long term (current) use of anticoagulants: Secondary | ICD-10-CM | POA: Diagnosis not present

## 2023-07-11 DIAGNOSIS — Z95811 Presence of heart assist device: Secondary | ICD-10-CM | POA: Diagnosis not present

## 2023-07-12 DIAGNOSIS — Z95811 Presence of heart assist device: Secondary | ICD-10-CM | POA: Diagnosis not present

## 2023-07-30 ENCOUNTER — Telehealth: Payer: Self-pay

## 2023-07-30 NOTE — Telephone Encounter (Signed)
LVM for patient to call back [phone], or to call PCP office to schedule follow up apt. AS, CMA  

## 2023-08-03 DIAGNOSIS — Z7901 Long term (current) use of anticoagulants: Secondary | ICD-10-CM | POA: Diagnosis not present

## 2023-08-03 DIAGNOSIS — E785 Hyperlipidemia, unspecified: Secondary | ICD-10-CM | POA: Diagnosis not present

## 2023-08-03 DIAGNOSIS — I5022 Chronic systolic (congestive) heart failure: Secondary | ICD-10-CM | POA: Diagnosis not present

## 2023-08-03 DIAGNOSIS — Z87891 Personal history of nicotine dependence: Secondary | ICD-10-CM | POA: Diagnosis not present

## 2023-08-03 DIAGNOSIS — U071 COVID-19: Secondary | ICD-10-CM | POA: Diagnosis not present

## 2023-08-03 DIAGNOSIS — N1831 Chronic kidney disease, stage 3a: Secondary | ICD-10-CM | POA: Diagnosis not present

## 2023-08-03 DIAGNOSIS — J811 Chronic pulmonary edema: Secondary | ICD-10-CM | POA: Diagnosis not present

## 2023-08-03 DIAGNOSIS — E669 Obesity, unspecified: Secondary | ICD-10-CM | POA: Diagnosis not present

## 2023-08-03 DIAGNOSIS — I428 Other cardiomyopathies: Secondary | ICD-10-CM | POA: Diagnosis not present

## 2023-08-03 DIAGNOSIS — I110 Hypertensive heart disease with heart failure: Secondary | ICD-10-CM | POA: Diagnosis not present

## 2023-08-03 DIAGNOSIS — Z8673 Personal history of transient ischemic attack (TIA), and cerebral infarction without residual deficits: Secondary | ICD-10-CM | POA: Diagnosis not present

## 2023-08-03 DIAGNOSIS — R0602 Shortness of breath: Secondary | ICD-10-CM | POA: Diagnosis not present

## 2023-08-03 DIAGNOSIS — Z95811 Presence of heart assist device: Secondary | ICD-10-CM | POA: Diagnosis not present

## 2023-08-03 DIAGNOSIS — J449 Chronic obstructive pulmonary disease, unspecified: Secondary | ICD-10-CM | POA: Diagnosis not present

## 2023-08-03 DIAGNOSIS — Z9049 Acquired absence of other specified parts of digestive tract: Secondary | ICD-10-CM | POA: Diagnosis not present

## 2023-08-03 DIAGNOSIS — M25551 Pain in right hip: Secondary | ICD-10-CM | POA: Diagnosis not present

## 2023-08-03 DIAGNOSIS — Z6838 Body mass index (BMI) 38.0-38.9, adult: Secondary | ICD-10-CM | POA: Diagnosis not present

## 2023-08-03 DIAGNOSIS — I472 Ventricular tachycardia, unspecified: Secondary | ICD-10-CM | POA: Diagnosis not present

## 2023-08-03 DIAGNOSIS — R0600 Dyspnea, unspecified: Secondary | ICD-10-CM | POA: Diagnosis not present

## 2023-08-03 DIAGNOSIS — M549 Dorsalgia, unspecified: Secondary | ICD-10-CM | POA: Diagnosis not present

## 2023-08-03 DIAGNOSIS — Z888 Allergy status to other drugs, medicaments and biological substances status: Secondary | ICD-10-CM | POA: Diagnosis not present

## 2023-08-03 DIAGNOSIS — I4891 Unspecified atrial fibrillation: Secondary | ICD-10-CM | POA: Diagnosis not present

## 2023-08-03 DIAGNOSIS — R931 Abnormal findings on diagnostic imaging of heart and coronary circulation: Secondary | ICD-10-CM | POA: Diagnosis not present

## 2023-08-08 DIAGNOSIS — Z7901 Long term (current) use of anticoagulants: Secondary | ICD-10-CM | POA: Diagnosis not present

## 2023-08-08 DIAGNOSIS — Z95811 Presence of heart assist device: Secondary | ICD-10-CM | POA: Diagnosis not present

## 2023-09-05 DIAGNOSIS — I428 Other cardiomyopathies: Secondary | ICD-10-CM | POA: Diagnosis not present

## 2023-09-05 DIAGNOSIS — I5022 Chronic systolic (congestive) heart failure: Secondary | ICD-10-CM | POA: Diagnosis not present

## 2023-09-05 DIAGNOSIS — N400 Enlarged prostate without lower urinary tract symptoms: Secondary | ICD-10-CM | POA: Diagnosis not present

## 2023-09-05 DIAGNOSIS — J449 Chronic obstructive pulmonary disease, unspecified: Secondary | ICD-10-CM | POA: Diagnosis not present

## 2023-09-05 DIAGNOSIS — M1009 Idiopathic gout, multiple sites: Secondary | ICD-10-CM | POA: Diagnosis not present

## 2023-09-05 DIAGNOSIS — I498 Other specified cardiac arrhythmias: Secondary | ICD-10-CM | POA: Diagnosis not present

## 2023-09-05 DIAGNOSIS — Z95811 Presence of heart assist device: Secondary | ICD-10-CM | POA: Diagnosis not present

## 2023-09-05 DIAGNOSIS — N1831 Chronic kidney disease, stage 3a: Secondary | ICD-10-CM | POA: Diagnosis not present

## 2023-09-05 DIAGNOSIS — I130 Hypertensive heart and chronic kidney disease with heart failure and stage 1 through stage 4 chronic kidney disease, or unspecified chronic kidney disease: Secondary | ICD-10-CM | POA: Diagnosis not present

## 2023-09-05 DIAGNOSIS — R7302 Impaired glucose tolerance (oral): Secondary | ICD-10-CM | POA: Diagnosis not present

## 2023-09-05 DIAGNOSIS — E785 Hyperlipidemia, unspecified: Secondary | ICD-10-CM | POA: Diagnosis not present

## 2023-09-05 DIAGNOSIS — K219 Gastro-esophageal reflux disease without esophagitis: Secondary | ICD-10-CM | POA: Diagnosis not present

## 2023-09-06 DIAGNOSIS — Z7901 Long term (current) use of anticoagulants: Secondary | ICD-10-CM | POA: Diagnosis not present

## 2023-09-06 DIAGNOSIS — Z95811 Presence of heart assist device: Secondary | ICD-10-CM | POA: Diagnosis not present

## 2023-09-10 DIAGNOSIS — I519 Heart disease, unspecified: Secondary | ICD-10-CM | POA: Diagnosis not present

## 2023-09-18 DIAGNOSIS — I10 Essential (primary) hypertension: Secondary | ICD-10-CM | POA: Diagnosis not present

## 2023-09-18 DIAGNOSIS — I447 Left bundle-branch block, unspecified: Secondary | ICD-10-CM | POA: Diagnosis not present

## 2023-09-18 DIAGNOSIS — I428 Other cardiomyopathies: Secondary | ICD-10-CM | POA: Diagnosis not present

## 2023-09-18 DIAGNOSIS — N1831 Chronic kidney disease, stage 3a: Secondary | ICD-10-CM | POA: Diagnosis not present

## 2023-09-18 DIAGNOSIS — Z7901 Long term (current) use of anticoagulants: Secondary | ICD-10-CM | POA: Diagnosis not present

## 2023-09-18 DIAGNOSIS — Z9189 Other specified personal risk factors, not elsewhere classified: Secondary | ICD-10-CM | POA: Diagnosis not present

## 2023-09-18 DIAGNOSIS — R0982 Postnasal drip: Secondary | ICD-10-CM | POA: Diagnosis not present

## 2023-09-18 DIAGNOSIS — I5022 Chronic systolic (congestive) heart failure: Secondary | ICD-10-CM | POA: Diagnosis not present

## 2023-09-18 DIAGNOSIS — Z87891 Personal history of nicotine dependence: Secondary | ICD-10-CM | POA: Diagnosis not present

## 2023-09-18 DIAGNOSIS — Z95811 Presence of heart assist device: Secondary | ICD-10-CM | POA: Diagnosis not present

## 2023-09-18 DIAGNOSIS — Z79899 Other long term (current) drug therapy: Secondary | ICD-10-CM | POA: Diagnosis not present

## 2023-09-18 DIAGNOSIS — I5084 End stage heart failure: Secondary | ICD-10-CM | POA: Diagnosis not present

## 2023-09-18 DIAGNOSIS — Z6838 Body mass index (BMI) 38.0-38.9, adult: Secondary | ICD-10-CM | POA: Diagnosis not present

## 2023-09-18 DIAGNOSIS — Z4509 Encounter for adjustment and management of other cardiac device: Secondary | ICD-10-CM | POA: Diagnosis not present

## 2023-09-18 DIAGNOSIS — I129 Hypertensive chronic kidney disease with stage 1 through stage 4 chronic kidney disease, or unspecified chronic kidney disease: Secondary | ICD-10-CM | POA: Diagnosis not present

## 2023-09-18 DIAGNOSIS — Z8673 Personal history of transient ischemic attack (TIA), and cerebral infarction without residual deficits: Secondary | ICD-10-CM | POA: Diagnosis not present

## 2023-09-18 DIAGNOSIS — I519 Heart disease, unspecified: Secondary | ICD-10-CM | POA: Diagnosis not present

## 2023-09-18 DIAGNOSIS — E785 Hyperlipidemia, unspecified: Secondary | ICD-10-CM | POA: Diagnosis not present

## 2023-09-18 DIAGNOSIS — R9431 Abnormal electrocardiogram [ECG] [EKG]: Secondary | ICD-10-CM | POA: Diagnosis not present

## 2023-09-20 DIAGNOSIS — M47816 Spondylosis without myelopathy or radiculopathy, lumbar region: Secondary | ICD-10-CM | POA: Diagnosis not present

## 2023-09-20 DIAGNOSIS — Z6838 Body mass index (BMI) 38.0-38.9, adult: Secondary | ICD-10-CM | POA: Diagnosis not present

## 2023-09-25 DIAGNOSIS — J384 Edema of larynx: Secondary | ICD-10-CM | POA: Diagnosis not present

## 2023-09-25 DIAGNOSIS — R09A2 Foreign body sensation, throat: Secondary | ICD-10-CM | POA: Diagnosis not present

## 2023-09-25 DIAGNOSIS — Z87891 Personal history of nicotine dependence: Secondary | ICD-10-CM | POA: Diagnosis not present

## 2023-09-25 DIAGNOSIS — R490 Dysphonia: Secondary | ICD-10-CM | POA: Diagnosis not present

## 2023-10-30 ENCOUNTER — Telehealth: Payer: Self-pay | Admitting: *Deleted

## 2023-10-30 NOTE — Patient Outreach (Signed)
Care Coordination   Initial Visit Note   10/30/2023 Name: Stephen [name] MRN: [id number] DOB: [birth year]  Stephen [name] is a 57 y.o. year old male who sees Feldpausch, Madaline Guthrie, MD for primary care. I spoke with  Stana Bunting by phone today.  What matters to the patients health and wellness today?  Denies any medical needs at this time.  State he is no longer a patient at [facility], now see Dr. Maryjane Hurter at [facility] in [place].  PCP updated in chart.       SDOH assessments and interventions completed:  No     Care Coordination Interventions:  No, not indicated   Follow up plan: No further intervention required.   Encounter Outcome:  Patient Visit Completed   Kemper Durie RN, MSN, CCM [place]  [facility], [facility] RN Care Coordinator Direct Dial: [phone] / Main [phone] Fax [phone] Email: [email or web address]: [email or web address]

## 2023-10-31 DIAGNOSIS — Z95811 Presence of heart assist device: Secondary | ICD-10-CM | POA: Diagnosis not present

## 2023-10-31 DIAGNOSIS — Z7901 Long term (current) use of anticoagulants: Secondary | ICD-10-CM | POA: Diagnosis not present

## 2023-11-01 DIAGNOSIS — J398 Other specified diseases of upper respiratory tract: Secondary | ICD-10-CM | POA: Diagnosis not present

## 2023-11-01 DIAGNOSIS — R490 Dysphonia: Secondary | ICD-10-CM | POA: Diagnosis not present

## 2023-11-01 DIAGNOSIS — J384 Edema of larynx: Secondary | ICD-10-CM | POA: Diagnosis not present

## 2023-11-25 DIAGNOSIS — N529 Male erectile dysfunction, unspecified: Secondary | ICD-10-CM | POA: Diagnosis not present

## 2023-11-25 DIAGNOSIS — Z125 Encounter for screening for malignant neoplasm of prostate: Secondary | ICD-10-CM | POA: Diagnosis not present

## 2023-11-27 DIAGNOSIS — Z7901 Long term (current) use of anticoagulants: Secondary | ICD-10-CM | POA: Diagnosis not present

## 2023-11-27 DIAGNOSIS — Z95811 Presence of heart assist device: Secondary | ICD-10-CM | POA: Diagnosis not present

## 2023-12-10 DIAGNOSIS — Z95811 Presence of heart assist device: Secondary | ICD-10-CM | POA: Diagnosis not present

## 2023-12-18 DIAGNOSIS — Z95811 Presence of heart assist device: Secondary | ICD-10-CM | POA: Diagnosis not present

## 2023-12-18 DIAGNOSIS — Z8679 Personal history of other diseases of the circulatory system: Secondary | ICD-10-CM | POA: Diagnosis not present

## 2023-12-18 DIAGNOSIS — Z23 Encounter for immunization: Secondary | ICD-10-CM | POA: Diagnosis not present

## 2023-12-18 DIAGNOSIS — I110 Hypertensive heart disease with heart failure: Secondary | ICD-10-CM | POA: Diagnosis not present

## 2023-12-18 DIAGNOSIS — I5020 Unspecified systolic (congestive) heart failure: Secondary | ICD-10-CM | POA: Diagnosis not present

## 2023-12-18 DIAGNOSIS — R9431 Abnormal electrocardiogram [ECG] [EKG]: Secondary | ICD-10-CM | POA: Diagnosis not present

## 2023-12-18 DIAGNOSIS — Z79899 Other long term (current) drug therapy: Secondary | ICD-10-CM | POA: Diagnosis not present

## 2023-12-18 DIAGNOSIS — I5022 Chronic systolic (congestive) heart failure: Secondary | ICD-10-CM | POA: Diagnosis not present

## 2023-12-18 DIAGNOSIS — Z4509 Encounter for adjustment and management of other cardiac device: Secondary | ICD-10-CM | POA: Diagnosis not present

## 2023-12-18 DIAGNOSIS — I428 Other cardiomyopathies: Secondary | ICD-10-CM | POA: Diagnosis not present

## 2023-12-18 DIAGNOSIS — Z95810 Presence of automatic (implantable) cardiac defibrillator: Secondary | ICD-10-CM | POA: Diagnosis not present

## 2023-12-18 DIAGNOSIS — Z7901 Long term (current) use of anticoagulants: Secondary | ICD-10-CM | POA: Diagnosis not present

## 2023-12-18 DIAGNOSIS — I499 Cardiac arrhythmia, unspecified: Secondary | ICD-10-CM | POA: Diagnosis not present

## 2023-12-19 DIAGNOSIS — I2510 Atherosclerotic heart disease of native coronary artery without angina pectoris: Secondary | ICD-10-CM | POA: Diagnosis not present

## 2023-12-19 DIAGNOSIS — M109 Gout, unspecified: Secondary | ICD-10-CM | POA: Diagnosis not present

## 2023-12-19 DIAGNOSIS — E785 Hyperlipidemia, unspecified: Secondary | ICD-10-CM | POA: Diagnosis not present

## 2023-12-19 DIAGNOSIS — K219 Gastro-esophageal reflux disease without esophagitis: Secondary | ICD-10-CM | POA: Diagnosis not present

## 2023-12-19 DIAGNOSIS — K5900 Constipation, unspecified: Secondary | ICD-10-CM | POA: Diagnosis not present

## 2023-12-19 DIAGNOSIS — N529 Male erectile dysfunction, unspecified: Secondary | ICD-10-CM | POA: Diagnosis not present

## 2023-12-19 DIAGNOSIS — H269 Unspecified cataract: Secondary | ICD-10-CM | POA: Diagnosis not present

## 2023-12-19 DIAGNOSIS — N189 Chronic kidney disease, unspecified: Secondary | ICD-10-CM | POA: Diagnosis not present

## 2023-12-19 DIAGNOSIS — M1990 Unspecified osteoarthritis, unspecified site: Secondary | ICD-10-CM | POA: Diagnosis not present

## 2023-12-19 DIAGNOSIS — I739 Peripheral vascular disease, unspecified: Secondary | ICD-10-CM | POA: Diagnosis not present

## 2023-12-19 DIAGNOSIS — I4891 Unspecified atrial fibrillation: Secondary | ICD-10-CM | POA: Diagnosis not present

## 2023-12-24 DIAGNOSIS — Z7901 Long term (current) use of anticoagulants: Secondary | ICD-10-CM | POA: Diagnosis not present

## 2023-12-24 DIAGNOSIS — Z95811 Presence of heart assist device: Secondary | ICD-10-CM | POA: Diagnosis not present

## 2024-01-23 DIAGNOSIS — Z95811 Presence of heart assist device: Secondary | ICD-10-CM | POA: Diagnosis not present

## 2024-01-23 DIAGNOSIS — Z7901 Long term (current) use of anticoagulants: Secondary | ICD-10-CM | POA: Diagnosis not present

## 2024-01-24 DIAGNOSIS — H2513 Age-related nuclear cataract, bilateral: Secondary | ICD-10-CM | POA: Diagnosis not present

## 2024-01-24 DIAGNOSIS — G43109 Migraine with aura, not intractable, without status migrainosus: Secondary | ICD-10-CM | POA: Diagnosis not present

## 2024-02-19 DIAGNOSIS — Z7901 Long term (current) use of anticoagulants: Secondary | ICD-10-CM | POA: Diagnosis not present

## 2024-02-19 DIAGNOSIS — Z95811 Presence of heart assist device: Secondary | ICD-10-CM | POA: Diagnosis not present

## 2024-03-02 DIAGNOSIS — K625 Hemorrhage of anus and rectum: Secondary | ICD-10-CM | POA: Diagnosis not present

## 2024-03-02 DIAGNOSIS — R0602 Shortness of breath: Secondary | ICD-10-CM | POA: Diagnosis not present

## 2024-03-02 DIAGNOSIS — K921 Melena: Secondary | ICD-10-CM | POA: Diagnosis not present

## 2024-03-02 DIAGNOSIS — Z87891 Personal history of nicotine dependence: Secondary | ICD-10-CM | POA: Diagnosis not present

## 2024-03-02 DIAGNOSIS — Z8674 Personal history of sudden cardiac arrest: Secondary | ICD-10-CM | POA: Diagnosis not present

## 2024-03-02 DIAGNOSIS — Z95811 Presence of heart assist device: Secondary | ICD-10-CM | POA: Diagnosis not present

## 2024-03-02 DIAGNOSIS — Z7901 Long term (current) use of anticoagulants: Secondary | ICD-10-CM | POA: Diagnosis not present

## 2024-03-03 DIAGNOSIS — Z7901 Long term (current) use of anticoagulants: Secondary | ICD-10-CM | POA: Diagnosis not present

## 2024-03-03 DIAGNOSIS — Z95811 Presence of heart assist device: Secondary | ICD-10-CM | POA: Diagnosis not present

## 2024-03-03 DIAGNOSIS — K625 Hemorrhage of anus and rectum: Secondary | ICD-10-CM | POA: Diagnosis not present

## 2024-03-03 DIAGNOSIS — R0602 Shortness of breath: Secondary | ICD-10-CM | POA: Diagnosis not present

## 2024-03-03 DIAGNOSIS — K921 Melena: Secondary | ICD-10-CM | POA: Diagnosis not present

## 2024-03-04 DIAGNOSIS — K6289 Other specified diseases of anus and rectum: Secondary | ICD-10-CM | POA: Diagnosis not present

## 2024-03-04 DIAGNOSIS — D125 Benign neoplasm of sigmoid colon: Secondary | ICD-10-CM | POA: Diagnosis not present

## 2024-03-04 DIAGNOSIS — K6389 Other specified diseases of intestine: Secondary | ICD-10-CM | POA: Diagnosis not present

## 2024-03-04 DIAGNOSIS — K648 Other hemorrhoids: Secondary | ICD-10-CM | POA: Diagnosis not present

## 2024-03-04 DIAGNOSIS — K625 Hemorrhage of anus and rectum: Secondary | ICD-10-CM | POA: Diagnosis not present

## 2024-03-04 DIAGNOSIS — K635 Polyp of colon: Secondary | ICD-10-CM | POA: Diagnosis not present

## 2024-03-04 DIAGNOSIS — K922 Gastrointestinal hemorrhage, unspecified: Secondary | ICD-10-CM | POA: Diagnosis not present

## 2024-03-05 DIAGNOSIS — K922 Gastrointestinal hemorrhage, unspecified: Secondary | ICD-10-CM | POA: Diagnosis not present

## 2024-03-09 DIAGNOSIS — K921 Melena: Secondary | ICD-10-CM | POA: Diagnosis not present

## 2024-03-09 DIAGNOSIS — K219 Gastro-esophageal reflux disease without esophagitis: Secondary | ICD-10-CM | POA: Diagnosis not present

## 2024-03-09 DIAGNOSIS — R9431 Abnormal electrocardiogram [ECG] [EKG]: Secondary | ICD-10-CM | POA: Diagnosis not present

## 2024-03-10 DIAGNOSIS — K921 Melena: Secondary | ICD-10-CM | POA: Diagnosis not present

## 2024-03-16 DIAGNOSIS — Z95811 Presence of heart assist device: Secondary | ICD-10-CM | POA: Diagnosis not present

## 2024-03-16 DIAGNOSIS — I5022 Chronic systolic (congestive) heart failure: Secondary | ICD-10-CM | POA: Diagnosis not present

## 2024-03-16 DIAGNOSIS — Z0000 Encounter for general adult medical examination without abnormal findings: Secondary | ICD-10-CM | POA: Diagnosis not present

## 2024-03-16 DIAGNOSIS — M1009 Idiopathic gout, multiple sites: Secondary | ICD-10-CM | POA: Diagnosis not present

## 2024-03-16 DIAGNOSIS — I428 Other cardiomyopathies: Secondary | ICD-10-CM | POA: Diagnosis not present

## 2024-03-16 DIAGNOSIS — N1831 Chronic kidney disease, stage 3a: Secondary | ICD-10-CM | POA: Diagnosis not present

## 2024-03-16 DIAGNOSIS — J449 Chronic obstructive pulmonary disease, unspecified: Secondary | ICD-10-CM | POA: Diagnosis not present

## 2024-03-16 DIAGNOSIS — I130 Hypertensive heart and chronic kidney disease with heart failure and stage 1 through stage 4 chronic kidney disease, or unspecified chronic kidney disease: Secondary | ICD-10-CM | POA: Diagnosis not present

## 2024-03-16 DIAGNOSIS — E785 Hyperlipidemia, unspecified: Secondary | ICD-10-CM | POA: Diagnosis not present

## 2024-03-16 DIAGNOSIS — I498 Other specified cardiac arrhythmias: Secondary | ICD-10-CM | POA: Diagnosis not present

## 2024-03-16 DIAGNOSIS — R7302 Impaired glucose tolerance (oral): Secondary | ICD-10-CM | POA: Diagnosis not present

## 2024-03-18 DIAGNOSIS — I5022 Chronic systolic (congestive) heart failure: Secondary | ICD-10-CM | POA: Diagnosis not present

## 2024-03-18 DIAGNOSIS — I428 Other cardiomyopathies: Secondary | ICD-10-CM | POA: Diagnosis not present

## 2024-03-18 DIAGNOSIS — I519 Heart disease, unspecified: Secondary | ICD-10-CM | POA: Diagnosis not present

## 2024-03-18 DIAGNOSIS — Z7901 Long term (current) use of anticoagulants: Secondary | ICD-10-CM | POA: Diagnosis not present

## 2024-03-18 DIAGNOSIS — I10 Essential (primary) hypertension: Secondary | ICD-10-CM | POA: Diagnosis not present

## 2024-03-18 DIAGNOSIS — Z95811 Presence of heart assist device: Secondary | ICD-10-CM | POA: Diagnosis not present

## 2024-03-19 DIAGNOSIS — Z7901 Long term (current) use of anticoagulants: Secondary | ICD-10-CM | POA: Diagnosis not present

## 2024-03-19 DIAGNOSIS — Z95811 Presence of heart assist device: Secondary | ICD-10-CM | POA: Diagnosis not present

## 2024-03-25 DIAGNOSIS — Z95810 Presence of automatic (implantable) cardiac defibrillator: Secondary | ICD-10-CM | POA: Diagnosis not present

## 2024-03-25 DIAGNOSIS — I5022 Chronic systolic (congestive) heart failure: Secondary | ICD-10-CM | POA: Diagnosis not present

## 2024-03-27 DIAGNOSIS — Z95811 Presence of heart assist device: Secondary | ICD-10-CM | POA: Diagnosis not present

## 2024-04-06 DIAGNOSIS — I5020 Unspecified systolic (congestive) heart failure: Secondary | ICD-10-CM | POA: Diagnosis not present

## 2024-04-06 DIAGNOSIS — I4729 Other ventricular tachycardia: Secondary | ICD-10-CM | POA: Diagnosis not present

## 2024-04-06 DIAGNOSIS — I10 Essential (primary) hypertension: Secondary | ICD-10-CM | POA: Diagnosis not present

## 2024-04-06 DIAGNOSIS — Z95810 Presence of automatic (implantable) cardiac defibrillator: Secondary | ICD-10-CM | POA: Diagnosis not present

## 2024-04-06 DIAGNOSIS — Z95811 Presence of heart assist device: Secondary | ICD-10-CM | POA: Diagnosis not present

## 2024-04-06 DIAGNOSIS — E782 Mixed hyperlipidemia: Secondary | ICD-10-CM | POA: Diagnosis not present

## 2024-04-06 DIAGNOSIS — Z7901 Long term (current) use of anticoagulants: Secondary | ICD-10-CM | POA: Diagnosis not present

## 2024-04-06 DIAGNOSIS — N1831 Chronic kidney disease, stage 3a: Secondary | ICD-10-CM | POA: Diagnosis not present

## 2024-04-06 DIAGNOSIS — I519 Heart disease, unspecified: Secondary | ICD-10-CM | POA: Diagnosis not present

## 2024-04-06 DIAGNOSIS — I428 Other cardiomyopathies: Secondary | ICD-10-CM | POA: Diagnosis not present

## 2024-04-06 DIAGNOSIS — I5022 Chronic systolic (congestive) heart failure: Secondary | ICD-10-CM | POA: Diagnosis not present

## 2024-04-16 DIAGNOSIS — Z95811 Presence of heart assist device: Secondary | ICD-10-CM | POA: Diagnosis not present

## 2024-04-16 DIAGNOSIS — Z7901 Long term (current) use of anticoagulants: Secondary | ICD-10-CM | POA: Diagnosis not present

## 2024-05-05 DIAGNOSIS — M170 Bilateral primary osteoarthritis of knee: Secondary | ICD-10-CM | POA: Diagnosis not present

## 2024-05-05 DIAGNOSIS — M25562 Pain in left knee: Secondary | ICD-10-CM | POA: Diagnosis not present

## 2024-05-05 DIAGNOSIS — M25561 Pain in right knee: Secondary | ICD-10-CM | POA: Diagnosis not present

## 2024-05-05 DIAGNOSIS — G8929 Other chronic pain: Secondary | ICD-10-CM | POA: Diagnosis not present

## 2024-05-14 DIAGNOSIS — K625 Hemorrhage of anus and rectum: Secondary | ICD-10-CM | POA: Diagnosis not present

## 2024-05-14 DIAGNOSIS — Z95811 Presence of heart assist device: Secondary | ICD-10-CM | POA: Diagnosis not present

## 2024-05-14 DIAGNOSIS — Z7901 Long term (current) use of anticoagulants: Secondary | ICD-10-CM | POA: Diagnosis not present

## 2024-05-14 DIAGNOSIS — K648 Other hemorrhoids: Secondary | ICD-10-CM | POA: Diagnosis not present

## 2024-05-14 DIAGNOSIS — K5900 Constipation, unspecified: Secondary | ICD-10-CM | POA: Diagnosis not present

## 2024-05-27 DIAGNOSIS — R0789 Other chest pain: Secondary | ICD-10-CM | POA: Diagnosis not present

## 2024-05-27 DIAGNOSIS — R9431 Abnormal electrocardiogram [ECG] [EKG]: Secondary | ICD-10-CM | POA: Diagnosis not present

## 2024-05-27 DIAGNOSIS — R059 Cough, unspecified: Secondary | ICD-10-CM | POA: Diagnosis not present

## 2024-05-27 DIAGNOSIS — Z87891 Personal history of nicotine dependence: Secondary | ICD-10-CM | POA: Diagnosis not present

## 2024-05-27 DIAGNOSIS — R067 Sneezing: Secondary | ICD-10-CM | POA: Diagnosis not present

## 2024-05-27 DIAGNOSIS — R0602 Shortness of breath: Secondary | ICD-10-CM | POA: Diagnosis not present

## 2024-05-27 DIAGNOSIS — Z95811 Presence of heart assist device: Secondary | ICD-10-CM | POA: Diagnosis not present

## 2024-05-27 DIAGNOSIS — R110 Nausea: Secondary | ICD-10-CM | POA: Diagnosis not present

## 2024-05-27 DIAGNOSIS — I517 Cardiomegaly: Secondary | ICD-10-CM | POA: Diagnosis not present

## 2024-06-08 DIAGNOSIS — J90 Pleural effusion, not elsewhere classified: Secondary | ICD-10-CM | POA: Diagnosis not present

## 2024-06-08 DIAGNOSIS — Z95810 Presence of automatic (implantable) cardiac defibrillator: Secondary | ICD-10-CM | POA: Diagnosis not present

## 2024-06-08 DIAGNOSIS — Z87891 Personal history of nicotine dependence: Secondary | ICD-10-CM | POA: Diagnosis not present

## 2024-06-08 DIAGNOSIS — Z95811 Presence of heart assist device: Secondary | ICD-10-CM | POA: Diagnosis not present

## 2024-06-08 DIAGNOSIS — R918 Other nonspecific abnormal finding of lung field: Secondary | ICD-10-CM | POA: Diagnosis not present

## 2024-06-11 DIAGNOSIS — Z95811 Presence of heart assist device: Secondary | ICD-10-CM | POA: Diagnosis not present

## 2024-06-11 DIAGNOSIS — Z7901 Long term (current) use of anticoagulants: Secondary | ICD-10-CM | POA: Diagnosis not present

## 2024-06-17 DIAGNOSIS — R9431 Abnormal electrocardiogram [ECG] [EKG]: Secondary | ICD-10-CM | POA: Diagnosis not present

## 2024-06-17 DIAGNOSIS — I5020 Unspecified systolic (congestive) heart failure: Secondary | ICD-10-CM | POA: Diagnosis not present

## 2024-06-17 DIAGNOSIS — I5084 End stage heart failure: Secondary | ICD-10-CM | POA: Diagnosis not present

## 2024-06-17 DIAGNOSIS — I10 Essential (primary) hypertension: Secondary | ICD-10-CM | POA: Diagnosis not present

## 2024-06-17 DIAGNOSIS — Z87891 Personal history of nicotine dependence: Secondary | ICD-10-CM | POA: Diagnosis not present

## 2024-06-17 DIAGNOSIS — Z9189 Other specified personal risk factors, not elsewhere classified: Secondary | ICD-10-CM | POA: Diagnosis not present

## 2024-06-17 DIAGNOSIS — Z8679 Personal history of other diseases of the circulatory system: Secondary | ICD-10-CM | POA: Diagnosis not present

## 2024-06-17 DIAGNOSIS — Z95811 Presence of heart assist device: Secondary | ICD-10-CM | POA: Diagnosis not present

## 2024-06-17 DIAGNOSIS — Z7901 Long term (current) use of anticoagulants: Secondary | ICD-10-CM | POA: Diagnosis not present

## 2024-06-17 DIAGNOSIS — I110 Hypertensive heart disease with heart failure: Secondary | ICD-10-CM | POA: Diagnosis not present

## 2024-06-17 DIAGNOSIS — I428 Other cardiomyopathies: Secondary | ICD-10-CM | POA: Diagnosis not present

## 2024-06-17 DIAGNOSIS — Z95810 Presence of automatic (implantable) cardiac defibrillator: Secondary | ICD-10-CM | POA: Diagnosis not present

## 2024-06-17 DIAGNOSIS — Z79899 Other long term (current) drug therapy: Secondary | ICD-10-CM | POA: Diagnosis not present

## 2024-06-17 DIAGNOSIS — I5022 Chronic systolic (congestive) heart failure: Secondary | ICD-10-CM | POA: Diagnosis not present

## 2024-06-29 DIAGNOSIS — N50812 Left testicular pain: Secondary | ICD-10-CM | POA: Diagnosis not present

## 2024-06-29 DIAGNOSIS — Z87891 Personal history of nicotine dependence: Secondary | ICD-10-CM | POA: Diagnosis not present

## 2024-06-29 DIAGNOSIS — Z125 Encounter for screening for malignant neoplasm of prostate: Secondary | ICD-10-CM | POA: Diagnosis not present

## 2024-06-29 DIAGNOSIS — C61 Malignant neoplasm of prostate: Secondary | ICD-10-CM | POA: Diagnosis not present

## 2024-06-29 DIAGNOSIS — R361 Hematospermia: Secondary | ICD-10-CM | POA: Diagnosis not present

## 2024-07-01 DIAGNOSIS — K648 Other hemorrhoids: Secondary | ICD-10-CM | POA: Diagnosis not present

## 2024-07-07 DIAGNOSIS — I5022 Chronic systolic (congestive) heart failure: Secondary | ICD-10-CM | POA: Diagnosis not present

## 2024-07-09 DIAGNOSIS — Z95811 Presence of heart assist device: Secondary | ICD-10-CM | POA: Diagnosis not present

## 2024-07-09 DIAGNOSIS — Z7901 Long term (current) use of anticoagulants: Secondary | ICD-10-CM | POA: Diagnosis not present

## 2024-07-21 DIAGNOSIS — Z95811 Presence of heart assist device: Secondary | ICD-10-CM | POA: Diagnosis not present

## 2024-08-06 DIAGNOSIS — Z95811 Presence of heart assist device: Secondary | ICD-10-CM | POA: Diagnosis not present

## 2024-08-06 DIAGNOSIS — Z7901 Long term (current) use of anticoagulants: Secondary | ICD-10-CM | POA: Diagnosis not present

## 2024-08-24 DIAGNOSIS — Z79899 Other long term (current) drug therapy: Secondary | ICD-10-CM | POA: Diagnosis not present

## 2024-08-24 DIAGNOSIS — Z87891 Personal history of nicotine dependence: Secondary | ICD-10-CM | POA: Diagnosis not present

## 2024-08-24 DIAGNOSIS — R519 Headache, unspecified: Secondary | ICD-10-CM | POA: Diagnosis not present

## 2024-08-24 DIAGNOSIS — R059 Cough, unspecified: Secondary | ICD-10-CM | POA: Diagnosis not present

## 2024-08-24 DIAGNOSIS — R9431 Abnormal electrocardiogram [ECG] [EKG]: Secondary | ICD-10-CM | POA: Diagnosis not present

## 2024-08-24 DIAGNOSIS — R42 Dizziness and giddiness: Secondary | ICD-10-CM | POA: Diagnosis not present

## 2024-08-24 DIAGNOSIS — I444 Left anterior fascicular block: Secondary | ICD-10-CM | POA: Diagnosis not present

## 2024-08-24 DIAGNOSIS — R609 Edema, unspecified: Secondary | ICD-10-CM | POA: Diagnosis not present

## 2024-08-24 DIAGNOSIS — Z1152 Encounter for screening for COVID-19: Secondary | ICD-10-CM | POA: Diagnosis not present

## 2024-08-24 DIAGNOSIS — R0602 Shortness of breath: Secondary | ICD-10-CM | POA: Diagnosis not present

## 2024-08-24 DIAGNOSIS — Z7901 Long term (current) use of anticoagulants: Secondary | ICD-10-CM | POA: Diagnosis not present

## 2024-08-24 DIAGNOSIS — I447 Left bundle-branch block, unspecified: Secondary | ICD-10-CM | POA: Diagnosis not present

## 2024-08-24 DIAGNOSIS — I2510 Atherosclerotic heart disease of native coronary artery without angina pectoris: Secondary | ICD-10-CM | POA: Diagnosis not present

## 2024-08-24 DIAGNOSIS — J811 Chronic pulmonary edema: Secondary | ICD-10-CM | POA: Diagnosis not present

## 2024-08-24 DIAGNOSIS — I5022 Chronic systolic (congestive) heart failure: Secondary | ICD-10-CM | POA: Diagnosis not present

## 2024-08-24 DIAGNOSIS — R0600 Dyspnea, unspecified: Secondary | ICD-10-CM | POA: Diagnosis not present

## 2024-08-24 DIAGNOSIS — Z95811 Presence of heart assist device: Secondary | ICD-10-CM | POA: Diagnosis not present

## 2024-08-24 DIAGNOSIS — N189 Chronic kidney disease, unspecified: Secondary | ICD-10-CM | POA: Diagnosis not present

## 2024-08-24 DIAGNOSIS — I130 Hypertensive heart and chronic kidney disease with heart failure and stage 1 through stage 4 chronic kidney disease, or unspecified chronic kidney disease: Secondary | ICD-10-CM | POA: Diagnosis not present

## 2024-09-03 DIAGNOSIS — Z7901 Long term (current) use of anticoagulants: Secondary | ICD-10-CM | POA: Diagnosis not present

## 2024-09-03 DIAGNOSIS — Z95811 Presence of heart assist device: Secondary | ICD-10-CM | POA: Diagnosis not present

## 2024-09-16 DIAGNOSIS — E785 Hyperlipidemia, unspecified: Secondary | ICD-10-CM | POA: Diagnosis not present

## 2024-09-16 DIAGNOSIS — I5022 Chronic systolic (congestive) heart failure: Secondary | ICD-10-CM | POA: Diagnosis not present

## 2024-09-16 DIAGNOSIS — I428 Other cardiomyopathies: Secondary | ICD-10-CM | POA: Diagnosis not present

## 2024-09-16 DIAGNOSIS — I130 Hypertensive heart and chronic kidney disease with heart failure and stage 1 through stage 4 chronic kidney disease, or unspecified chronic kidney disease: Secondary | ICD-10-CM | POA: Diagnosis not present

## 2024-09-16 DIAGNOSIS — Z1331 Encounter for screening for depression: Secondary | ICD-10-CM | POA: Diagnosis not present

## 2024-09-16 DIAGNOSIS — K219 Gastro-esophageal reflux disease without esophagitis: Secondary | ICD-10-CM | POA: Diagnosis not present

## 2024-09-16 DIAGNOSIS — R7302 Impaired glucose tolerance (oral): Secondary | ICD-10-CM | POA: Diagnosis not present

## 2024-09-16 DIAGNOSIS — N1831 Chronic kidney disease, stage 3a: Secondary | ICD-10-CM | POA: Diagnosis not present

## 2024-09-16 DIAGNOSIS — I498 Other specified cardiac arrhythmias: Secondary | ICD-10-CM | POA: Diagnosis not present

## 2024-09-16 DIAGNOSIS — Z95811 Presence of heart assist device: Secondary | ICD-10-CM | POA: Diagnosis not present

## 2024-09-16 DIAGNOSIS — M1009 Idiopathic gout, multiple sites: Secondary | ICD-10-CM | POA: Diagnosis not present

## 2024-09-16 DIAGNOSIS — N400 Enlarged prostate without lower urinary tract symptoms: Secondary | ICD-10-CM | POA: Diagnosis not present

## 2024-09-16 DIAGNOSIS — J449 Chronic obstructive pulmonary disease, unspecified: Secondary | ICD-10-CM | POA: Diagnosis not present

## 2024-09-23 DIAGNOSIS — I428 Other cardiomyopathies: Secondary | ICD-10-CM | POA: Diagnosis not present

## 2024-09-23 DIAGNOSIS — Z7901 Long term (current) use of anticoagulants: Secondary | ICD-10-CM | POA: Diagnosis not present

## 2024-09-23 DIAGNOSIS — Z95811 Presence of heart assist device: Secondary | ICD-10-CM | POA: Diagnosis not present

## 2024-09-23 DIAGNOSIS — A498 Other bacterial infections of unspecified site: Secondary | ICD-10-CM | POA: Diagnosis not present

## 2024-09-23 DIAGNOSIS — E66812 Obesity, class 2: Secondary | ICD-10-CM | POA: Diagnosis not present

## 2024-09-23 DIAGNOSIS — E785 Hyperlipidemia, unspecified: Secondary | ICD-10-CM | POA: Diagnosis not present

## 2024-09-23 DIAGNOSIS — R748 Abnormal levels of other serum enzymes: Secondary | ICD-10-CM | POA: Diagnosis not present

## 2024-09-23 DIAGNOSIS — I519 Heart disease, unspecified: Secondary | ICD-10-CM | POA: Diagnosis not present

## 2024-09-23 DIAGNOSIS — I5022 Chronic systolic (congestive) heart failure: Secondary | ICD-10-CM | POA: Diagnosis not present

## 2024-09-23 DIAGNOSIS — I10 Essential (primary) hypertension: Secondary | ICD-10-CM | POA: Diagnosis not present

## 2024-09-23 DIAGNOSIS — R799 Abnormal finding of blood chemistry, unspecified: Secondary | ICD-10-CM | POA: Diagnosis not present

## 2024-09-23 DIAGNOSIS — Z6838 Body mass index (BMI) 38.0-38.9, adult: Secondary | ICD-10-CM | POA: Diagnosis not present

## 2024-10-01 DIAGNOSIS — Z7901 Long term (current) use of anticoagulants: Secondary | ICD-10-CM | POA: Diagnosis not present

## 2024-10-01 DIAGNOSIS — Z95811 Presence of heart assist device: Secondary | ICD-10-CM | POA: Diagnosis not present

## 2024-10-06 DIAGNOSIS — I5022 Chronic systolic (congestive) heart failure: Secondary | ICD-10-CM | POA: Diagnosis not present

## 2024-10-09 DIAGNOSIS — Z95810 Presence of automatic (implantable) cardiac defibrillator: Secondary | ICD-10-CM | POA: Diagnosis not present

## 2024-10-09 DIAGNOSIS — I4729 Other ventricular tachycardia: Secondary | ICD-10-CM | POA: Diagnosis not present

## 2024-10-09 DIAGNOSIS — E785 Hyperlipidemia, unspecified: Secondary | ICD-10-CM | POA: Diagnosis not present

## 2024-10-09 DIAGNOSIS — Z7901 Long term (current) use of anticoagulants: Secondary | ICD-10-CM | POA: Diagnosis not present

## 2024-10-09 DIAGNOSIS — I519 Heart disease, unspecified: Secondary | ICD-10-CM | POA: Diagnosis not present

## 2024-10-09 DIAGNOSIS — Z95811 Presence of heart assist device: Secondary | ICD-10-CM | POA: Diagnosis not present

## 2024-10-09 DIAGNOSIS — E66812 Obesity, class 2: Secondary | ICD-10-CM | POA: Diagnosis not present

## 2024-10-09 DIAGNOSIS — I10 Essential (primary) hypertension: Secondary | ICD-10-CM | POA: Diagnosis not present

## 2024-10-09 DIAGNOSIS — Z6838 Body mass index (BMI) 38.0-38.9, adult: Secondary | ICD-10-CM | POA: Diagnosis not present

## 2024-10-09 DIAGNOSIS — I5020 Unspecified systolic (congestive) heart failure: Secondary | ICD-10-CM | POA: Diagnosis not present

## 2024-10-09 DIAGNOSIS — I428 Other cardiomyopathies: Secondary | ICD-10-CM | POA: Diagnosis not present

## 2024-10-09 DIAGNOSIS — I5022 Chronic systolic (congestive) heart failure: Secondary | ICD-10-CM | POA: Diagnosis not present

## 2024-10-29 DIAGNOSIS — Z7901 Long term (current) use of anticoagulants: Secondary | ICD-10-CM | POA: Diagnosis not present

## 2024-10-29 DIAGNOSIS — Z95811 Presence of heart assist device: Secondary | ICD-10-CM | POA: Diagnosis not present

## 2024-11-13 DIAGNOSIS — Z95811 Presence of heart assist device: Secondary | ICD-10-CM | POA: Diagnosis not present

## 2024-11-25 DIAGNOSIS — Z7901 Long term (current) use of anticoagulants: Secondary | ICD-10-CM | POA: Diagnosis not present

## 2024-11-25 DIAGNOSIS — Z95811 Presence of heart assist device: Secondary | ICD-10-CM | POA: Diagnosis not present

## 2024-12-22 DIAGNOSIS — Z7901 Long term (current) use of anticoagulants: Secondary | ICD-10-CM | POA: Diagnosis not present

## 2024-12-22 DIAGNOSIS — Z95811 Presence of heart assist device: Secondary | ICD-10-CM | POA: Diagnosis not present

## 2025-02-01 ENCOUNTER — Encounter: Payer: Self-pay | Admitting: *Deleted

## 2025-02-01 NOTE — Progress Notes (Signed)
 HERNDON GRILL                                          MRN: [id number]   02/01/2025   The VBCI Quality Team Specialist reviewed this patient medical record for the purposes of chart review for care gap closure. The following were reviewed: chart review for care gap closure-controlling blood pressure.    VBCI Quality Team
